# Patient Record
Sex: Male | Born: 1945
Health system: Southern US, Community
[De-identification: ages and names within clinical notes are randomized; demographics above are authoritative.]

## PROBLEM LIST (undated history)

## (undated) DIAGNOSIS — E278 Other specified disorders of adrenal gland: Secondary | ICD-10-CM

## (undated) DIAGNOSIS — I255 Ischemic cardiomyopathy: Secondary | ICD-10-CM

## (undated) DIAGNOSIS — M1990 Unspecified osteoarthritis, unspecified site: Secondary | ICD-10-CM

## (undated) DIAGNOSIS — R0600 Dyspnea, unspecified: Secondary | ICD-10-CM

## (undated) DIAGNOSIS — I472 Ventricular tachycardia, unspecified: Secondary | ICD-10-CM

## (undated) DIAGNOSIS — J111 Influenza due to unidentified influenza virus with other respiratory manifestations: Secondary | ICD-10-CM

## (undated) DIAGNOSIS — J449 Chronic obstructive pulmonary disease, unspecified: Secondary | ICD-10-CM

## (undated) DIAGNOSIS — E785 Hyperlipidemia, unspecified: Secondary | ICD-10-CM

## (undated) DIAGNOSIS — K5792 Diverticulitis of intestine, part unspecified, without perforation or abscess without bleeding: Secondary | ICD-10-CM

## (undated) DIAGNOSIS — L4050 Arthropathic psoriasis, unspecified: Secondary | ICD-10-CM

## (undated) DIAGNOSIS — F419 Anxiety disorder, unspecified: Secondary | ICD-10-CM

## (undated) DIAGNOSIS — C801 Malignant (primary) neoplasm, unspecified: Secondary | ICD-10-CM

## (undated) DIAGNOSIS — I714 Abdominal aortic aneurysm, without rupture, unspecified: Secondary | ICD-10-CM

## (undated) DIAGNOSIS — I10 Essential (primary) hypertension: Secondary | ICD-10-CM

## (undated) DIAGNOSIS — I2510 Atherosclerotic heart disease of native coronary artery without angina pectoris: Secondary | ICD-10-CM

## (undated) DIAGNOSIS — Z95810 Presence of automatic (implantable) cardiac defibrillator: Secondary | ICD-10-CM

## (undated) DIAGNOSIS — I739 Peripheral vascular disease, unspecified: Secondary | ICD-10-CM

## (undated) DIAGNOSIS — I5022 Chronic systolic (congestive) heart failure: Secondary | ICD-10-CM

## (undated) DIAGNOSIS — I509 Heart failure, unspecified: Secondary | ICD-10-CM

## (undated) DIAGNOSIS — I480 Paroxysmal atrial fibrillation: Secondary | ICD-10-CM

## (undated) HISTORY — DX: Atherosclerotic heart disease of native coronary artery without angina pectoris: I25.10

## (undated) HISTORY — DX: Anxiety disorder, unspecified: F41.9

## (undated) HISTORY — PX: REPAIR KNEE LIGAMENT: SUR1188

## (undated) HISTORY — DX: Peripheral vascular disease, unspecified: I73.9

## (undated) HISTORY — DX: Other specified disorders of adrenal gland: E27.8

## (undated) HISTORY — PX: OTHER SURGICAL HISTORY: SHX169

## (undated) HISTORY — DX: Arthropathic psoriasis, unspecified: L40.50

## (undated) HISTORY — DX: Hyperlipidemia, unspecified: E78.5

## (undated) HISTORY — DX: Ischemic cardiomyopathy: I25.5

## (undated) HISTORY — DX: Heart failure, unspecified: I50.9

## (undated) HISTORY — DX: Essential (primary) hypertension: I10

## (undated) HISTORY — DX: Chronic obstructive pulmonary disease, unspecified: J44.9

## (undated) HISTORY — DX: Paroxysmal atrial fibrillation: I48.0

---

## 1997-11-01 ENCOUNTER — Inpatient Hospital Stay (HOSPITAL_COMMUNITY): Admission: EM | Admit: 1997-11-01 | Discharge: 1997-11-04 | Payer: Self-pay | Admitting: Emergency Medicine

## 1997-11-01 ENCOUNTER — Encounter: Payer: Self-pay | Admitting: Emergency Medicine

## 1999-02-05 HISTORY — PX: CORONARY ANGIOPLASTY WITH STENT PLACEMENT: SHX49

## 1999-08-01 ENCOUNTER — Inpatient Hospital Stay (HOSPITAL_COMMUNITY): Admission: EM | Admit: 1999-08-01 | Discharge: 1999-08-03 | Payer: Self-pay | Admitting: Emergency Medicine

## 1999-11-02 ENCOUNTER — Ambulatory Visit (HOSPITAL_COMMUNITY): Admission: RE | Admit: 1999-11-02 | Discharge: 1999-11-02 | Payer: Self-pay | Admitting: Cardiology

## 2003-10-14 ENCOUNTER — Encounter: Admission: RE | Admit: 2003-10-14 | Discharge: 2003-10-14 | Payer: Self-pay | Admitting: Endocrinology

## 2004-10-29 ENCOUNTER — Ambulatory Visit: Payer: Self-pay | Admitting: Endocrinology

## 2004-11-05 ENCOUNTER — Ambulatory Visit: Payer: Self-pay | Admitting: Endocrinology

## 2004-12-13 ENCOUNTER — Ambulatory Visit: Payer: Self-pay | Admitting: Endocrinology

## 2005-02-06 ENCOUNTER — Ambulatory Visit: Payer: Self-pay | Admitting: Endocrinology

## 2005-02-08 ENCOUNTER — Ambulatory Visit: Payer: Self-pay | Admitting: Endocrinology

## 2005-06-05 ENCOUNTER — Encounter: Payer: Self-pay | Admitting: Emergency Medicine

## 2005-12-31 ENCOUNTER — Ambulatory Visit: Payer: Self-pay | Admitting: Endocrinology

## 2006-10-01 ENCOUNTER — Encounter: Payer: Self-pay | Admitting: Endocrinology

## 2006-10-01 DIAGNOSIS — I10 Essential (primary) hypertension: Secondary | ICD-10-CM | POA: Insufficient documentation

## 2006-10-01 DIAGNOSIS — J449 Chronic obstructive pulmonary disease, unspecified: Secondary | ICD-10-CM | POA: Insufficient documentation

## 2006-10-01 DIAGNOSIS — E279 Disorder of adrenal gland, unspecified: Secondary | ICD-10-CM

## 2006-10-01 DIAGNOSIS — I255 Ischemic cardiomyopathy: Secondary | ICD-10-CM

## 2006-10-01 DIAGNOSIS — Z87448 Personal history of other diseases of urinary system: Secondary | ICD-10-CM

## 2006-10-01 DIAGNOSIS — F411 Generalized anxiety disorder: Secondary | ICD-10-CM

## 2006-10-01 DIAGNOSIS — R7309 Other abnormal glucose: Secondary | ICD-10-CM

## 2006-10-01 DIAGNOSIS — I2510 Atherosclerotic heart disease of native coronary artery without angina pectoris: Secondary | ICD-10-CM | POA: Insufficient documentation

## 2007-01-22 ENCOUNTER — Encounter: Payer: Self-pay | Admitting: Endocrinology

## 2007-03-20 ENCOUNTER — Telehealth: Payer: Self-pay | Admitting: Endocrinology

## 2007-03-24 ENCOUNTER — Ambulatory Visit: Payer: Self-pay | Admitting: Endocrinology

## 2007-03-24 DIAGNOSIS — R7989 Other specified abnormal findings of blood chemistry: Secondary | ICD-10-CM | POA: Insufficient documentation

## 2007-03-25 LAB — CONVERTED CEMR LAB
Albumin: 3.7 g/dL (ref 3.5–5.2)
Bacteria, UA: NEGATIVE
Basophils Absolute: 0 10*3/uL (ref 0.0–0.1)
Bilirubin Urine: NEGATIVE
CO2: 30 meq/L (ref 19–32)
Cholesterol: 198 mg/dL (ref 0–200)
Creatinine, Ser: 1 mg/dL (ref 0.4–1.5)
GFR calc Af Amer: 97 mL/min
HCT: 46.2 % (ref 39.0–52.0)
HDL: 34.2 mg/dL — ABNORMAL LOW (ref >39.0)
Hemoglobin: 15.4 g/dL (ref 13.0–17.0)
Hgb A1c MFr Bld: 5.9 % (ref 4.6–6.0)
Leukocytes, UA: NEGATIVE
Lymphocytes Relative: 18.9 % (ref 12.0–46.0)
MCHC: 33.3 g/dL (ref 30.0–36.0)
MCV: 91.3 fL (ref 78.0–100.0)
Monocytes Absolute: 0.9 10*3/uL — ABNORMAL HIGH (ref 0.2–0.7)
Monocytes Relative: 9.2 % (ref 3.0–11.0)
Neutro Abs: 6.9 10*3/uL (ref 1.4–7.7)
Neutrophils Relative %: 68.6 % (ref 43.0–77.0)
PSA: 4.38 ng/mL — ABNORMAL HIGH (ref 0.10–4.00)
Potassium: 3.8 meq/L (ref 3.5–5.1)
RDW: 13.9 % (ref 11.5–14.6)
Sodium: 140 meq/L (ref 135–145)
TSH: 1.2 microintl units/mL (ref 0.35–5.50)
Total Bilirubin: 0.8 mg/dL (ref 0.3–1.2)
Total Protein: 6.7 g/dL (ref 6.0–8.3)
Triglycerides: 137 mg/dL (ref 0–149)
Urobilinogen, UA: 0.2 (ref 0.0–1.0)
VLDL: 27 mg/dL (ref 0–40)
WBC, UA: NONE SEEN cells/hpf

## 2007-04-08 ENCOUNTER — Ambulatory Visit: Payer: Self-pay | Admitting: Endocrinology

## 2007-04-08 DIAGNOSIS — R05 Cough: Secondary | ICD-10-CM

## 2007-04-08 DIAGNOSIS — R972 Elevated prostate specific antigen [PSA]: Secondary | ICD-10-CM

## 2007-04-09 ENCOUNTER — Encounter (INDEPENDENT_AMBULATORY_CARE_PROVIDER_SITE_OTHER): Payer: Self-pay | Admitting: *Deleted

## 2007-04-29 ENCOUNTER — Ambulatory Visit: Payer: Self-pay | Admitting: Cardiology

## 2007-05-01 ENCOUNTER — Emergency Department (HOSPITAL_COMMUNITY): Admission: EM | Admit: 2007-05-01 | Discharge: 2007-05-01 | Payer: Self-pay | Admitting: Emergency Medicine

## 2007-05-04 ENCOUNTER — Ambulatory Visit: Payer: Self-pay | Admitting: Cardiology

## 2007-05-04 ENCOUNTER — Ambulatory Visit: Payer: Self-pay | Admitting: Gastroenterology

## 2007-05-04 LAB — CONVERTED CEMR LAB
BUN: 9 mg/dL (ref 6–23)
Creatinine, Ser: 1 mg/dL (ref 0.4–1.5)
GFR calc Af Amer: 97 mL/min
GFR calc non Af Amer: 80 mL/min

## 2007-05-11 ENCOUNTER — Ambulatory Visit: Payer: Self-pay | Admitting: Cardiology

## 2007-05-11 LAB — CONVERTED CEMR LAB
Calcium: 9.2 mg/dL (ref 8.4–10.5)
Calcium: 9.2 mg/dL (ref 8.4–10.5)
Creatinine, Ser: 1 mg/dL (ref 0.4–1.5)
GFR calc Af Amer: 97 mL/min
GFR calc Af Amer: 110 mL/min
GFR calc non Af Amer: 80 mL/min
GFR calc non Af Amer: 91 mL/min
Sodium: 135 meq/L (ref 135–145)

## 2007-05-14 ENCOUNTER — Encounter: Payer: Self-pay | Admitting: Gastroenterology

## 2007-05-14 ENCOUNTER — Encounter: Payer: Self-pay | Admitting: Internal Medicine

## 2007-05-14 ENCOUNTER — Ambulatory Visit: Payer: Self-pay | Admitting: Gastroenterology

## 2007-06-01 ENCOUNTER — Telehealth (INDEPENDENT_AMBULATORY_CARE_PROVIDER_SITE_OTHER): Payer: Self-pay | Admitting: *Deleted

## 2007-06-01 ENCOUNTER — Ambulatory Visit: Payer: Self-pay | Admitting: Cardiology

## 2007-06-03 ENCOUNTER — Ambulatory Visit: Payer: Self-pay | Admitting: Cardiology

## 2007-06-03 ENCOUNTER — Inpatient Hospital Stay (HOSPITAL_COMMUNITY): Admission: EM | Admit: 2007-06-03 | Discharge: 2007-06-06 | Payer: Self-pay | Admitting: Emergency Medicine

## 2007-06-04 ENCOUNTER — Encounter: Payer: Self-pay | Admitting: Cardiology

## 2007-06-19 ENCOUNTER — Ambulatory Visit: Payer: Self-pay

## 2007-08-04 ENCOUNTER — Ambulatory Visit: Payer: Self-pay

## 2007-08-04 LAB — CONVERTED CEMR LAB
ALT: 15 units/L (ref 0–53)
AST: 19 units/L (ref 0–37)
BUN: 13 mg/dL (ref 6–23)
CO2: 27 meq/L (ref 19–32)
Chloride: 109 meq/L (ref 96–112)
Cholesterol: 121 mg/dL (ref 0–200)
Creatinine, Ser: 0.9 mg/dL (ref 0.4–1.5)
Total Bilirubin: 0.8 mg/dL (ref 0.3–1.2)
Total CHOL/HDL Ratio: 4.1
Triglycerides: 119 mg/dL (ref 0–149)

## 2008-01-26 ENCOUNTER — Emergency Department (HOSPITAL_COMMUNITY): Admission: EM | Admit: 2008-01-26 | Discharge: 2008-01-26 | Payer: Self-pay | Admitting: Emergency Medicine

## 2008-01-26 ENCOUNTER — Emergency Department (HOSPITAL_COMMUNITY): Admission: EM | Admit: 2008-01-26 | Discharge: 2008-01-26 | Payer: Self-pay | Admitting: *Deleted

## 2008-07-11 ENCOUNTER — Telehealth: Payer: Self-pay | Admitting: Cardiology

## 2008-09-05 DIAGNOSIS — E785 Hyperlipidemia, unspecified: Secondary | ICD-10-CM | POA: Insufficient documentation

## 2008-09-06 ENCOUNTER — Ambulatory Visit: Payer: Self-pay | Admitting: Cardiology

## 2008-09-06 ENCOUNTER — Encounter (INDEPENDENT_AMBULATORY_CARE_PROVIDER_SITE_OTHER): Payer: Self-pay | Admitting: *Deleted

## 2008-09-06 DIAGNOSIS — I714 Abdominal aortic aneurysm, without rupture, unspecified: Secondary | ICD-10-CM | POA: Insufficient documentation

## 2008-09-06 DIAGNOSIS — F17200 Nicotine dependence, unspecified, uncomplicated: Secondary | ICD-10-CM

## 2008-10-28 ENCOUNTER — Ambulatory Visit: Payer: Self-pay

## 2008-10-28 ENCOUNTER — Encounter (INDEPENDENT_AMBULATORY_CARE_PROVIDER_SITE_OTHER): Payer: Self-pay | Admitting: *Deleted

## 2008-10-28 ENCOUNTER — Encounter: Payer: Self-pay | Admitting: Cardiology

## 2008-10-28 ENCOUNTER — Ambulatory Visit: Payer: Self-pay | Admitting: Cardiology

## 2008-10-28 LAB — CONVERTED CEMR LAB
ALT: 15 units/L (ref 0–53)
BUN: 12 mg/dL (ref 6–23)
CO2: 27 meq/L (ref 19–32)
Chloride: 106 meq/L (ref 96–112)
Cholesterol: 117 mg/dL (ref 0–200)
Creatinine, Ser: 0.8 mg/dL (ref 0.4–1.5)
Glucose, Bld: 87 mg/dL (ref 70–99)
LDL Cholesterol: 71 mg/dL (ref 0–99)
Total Bilirubin: 0.9 mg/dL (ref 0.3–1.2)
Total Protein: 7.4 g/dL (ref 6.0–8.3)
Triglycerides: 49 mg/dL (ref 0.0–149.0)

## 2008-11-01 ENCOUNTER — Ambulatory Visit: Payer: Self-pay | Admitting: Endocrinology

## 2008-11-01 DIAGNOSIS — R634 Abnormal weight loss: Secondary | ICD-10-CM | POA: Insufficient documentation

## 2008-11-03 LAB — CONVERTED CEMR LAB
Basophils Absolute: 0.1 10*3/uL (ref 0.0–0.1)
HCT: 44 % (ref 39.0–52.0)
Hgb A1c MFr Bld: 6 % (ref 4.6–6.5)
Lymphs Abs: 2.4 10*3/uL (ref 0.7–4.0)
MCHC: 33.9 g/dL (ref 30.0–36.0)
MCV: 94.2 fL (ref 78.0–100.0)
Monocytes Absolute: 0.8 10*3/uL (ref 0.1–1.0)
Platelets: 180 10*3/uL (ref 150.0–400.0)
RDW: 14.5 % (ref 11.5–14.6)
Urine Glucose: NEGATIVE mg/dL
Urobilinogen, UA: 1 (ref 0.0–1.0)

## 2008-12-14 ENCOUNTER — Encounter (INDEPENDENT_AMBULATORY_CARE_PROVIDER_SITE_OTHER): Payer: Self-pay | Admitting: *Deleted

## 2008-12-26 ENCOUNTER — Encounter (INDEPENDENT_AMBULATORY_CARE_PROVIDER_SITE_OTHER): Payer: Self-pay | Admitting: *Deleted

## 2009-01-04 ENCOUNTER — Encounter (INDEPENDENT_AMBULATORY_CARE_PROVIDER_SITE_OTHER): Payer: Self-pay | Admitting: *Deleted

## 2009-11-10 ENCOUNTER — Ambulatory Visit: Payer: Self-pay | Admitting: Cardiology

## 2009-11-22 IMAGING — CR DG FINGER MIDDLE 2+V*L*
3 series · 3 of 3 positions shown · non-contrast
Comparison: An

CLINICAL DATA: Laceration

LEFT MIDDLE FINGER 2+V

[x finger pa left]
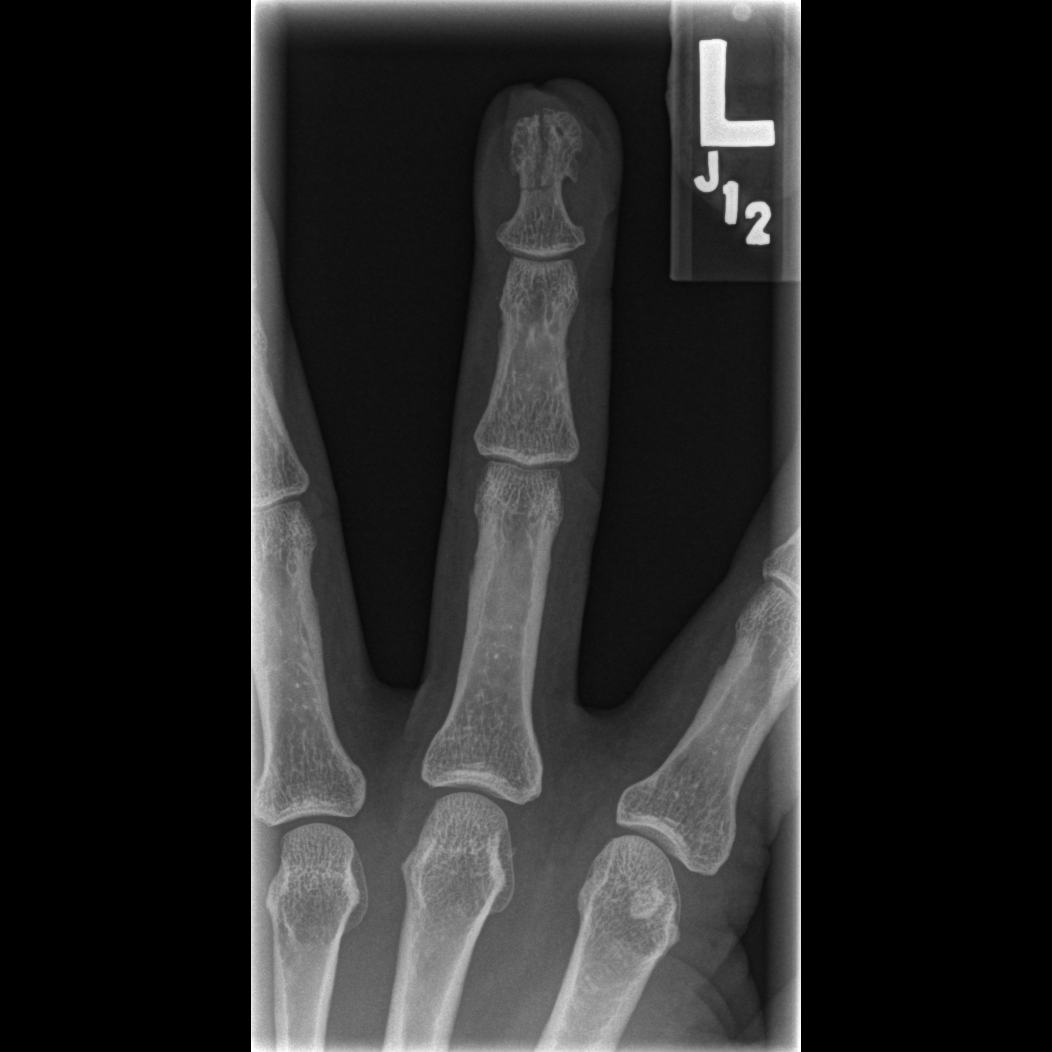

[x finger obl. left]
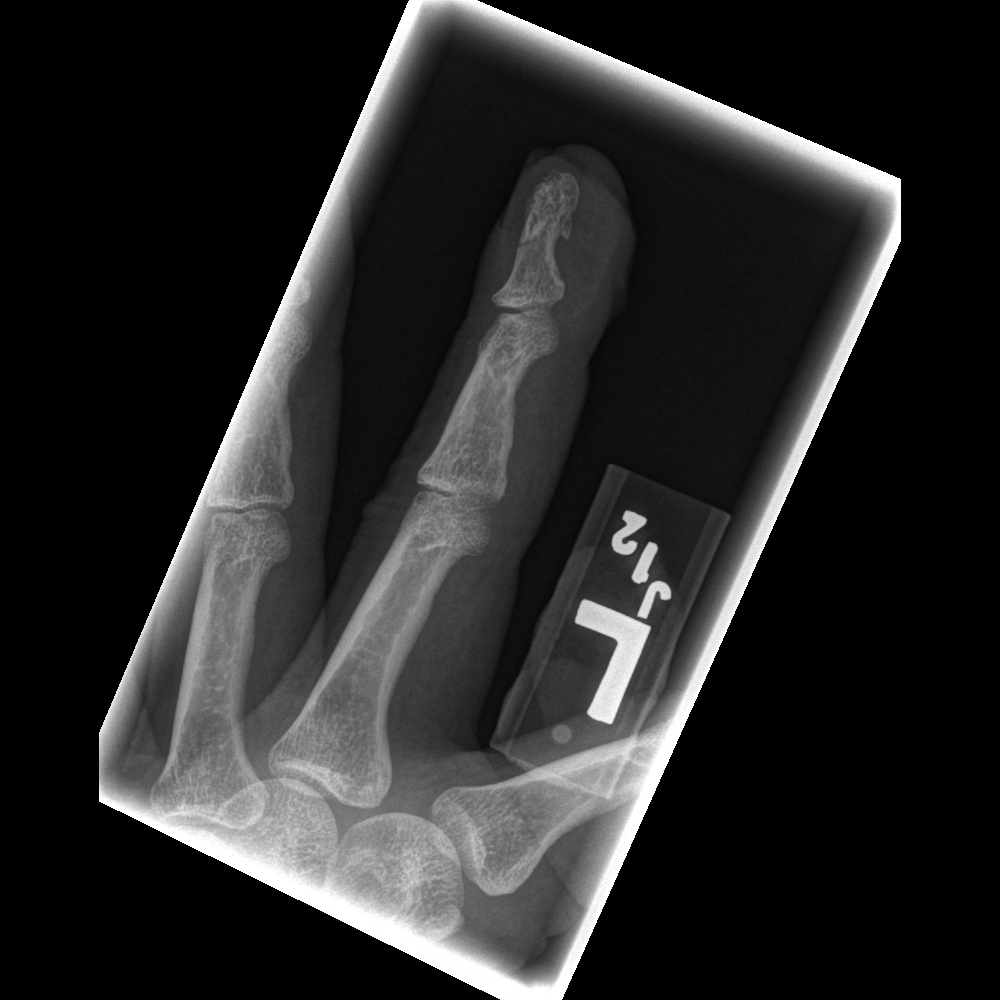

[x finger lateral left]
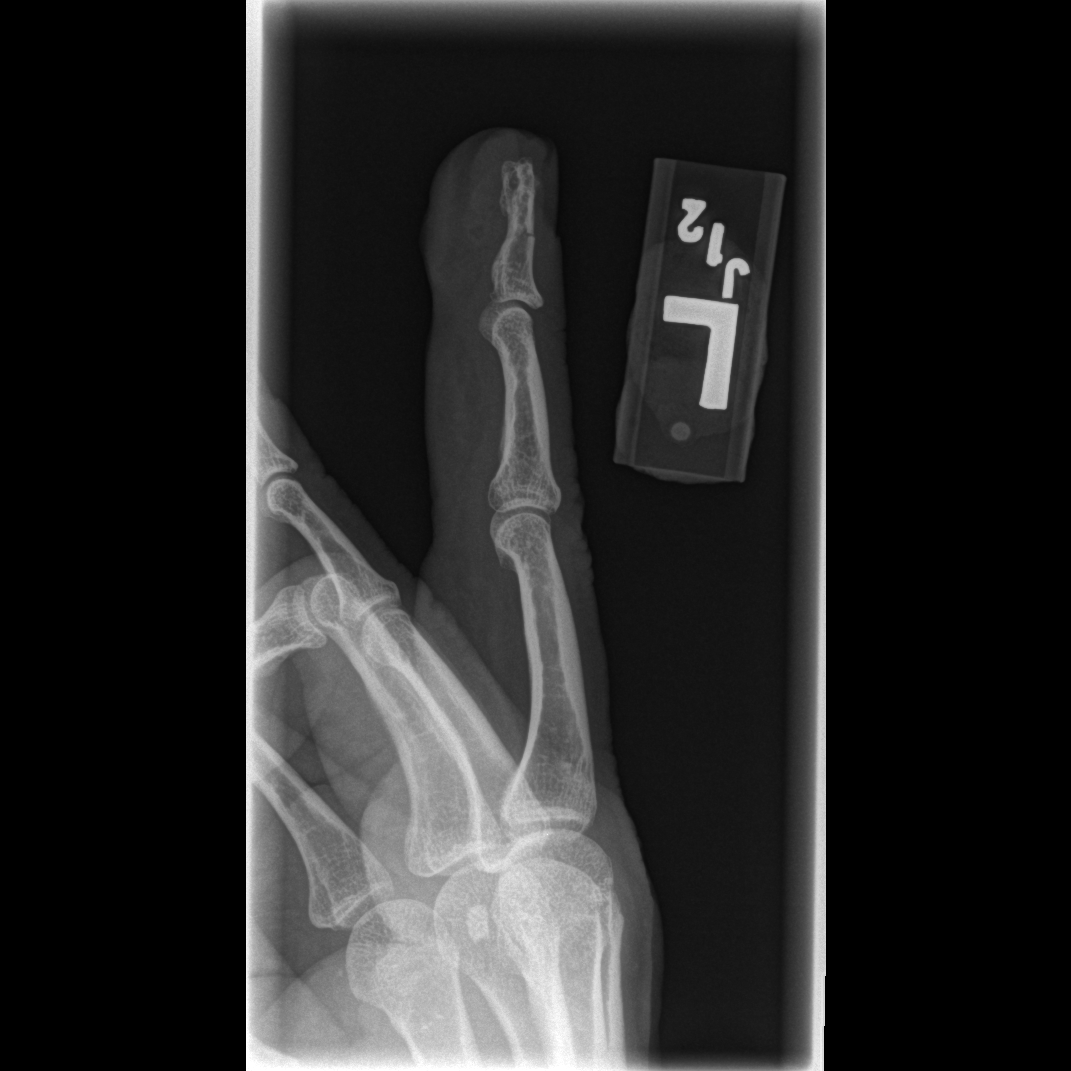

[3 of 3 positions shown; findings below may reference images not displayed]

FINDINGS: Three views of the left middle finger.  There is a
fracture involving the left middle finger  distal phalanx.
Fracture extends to the distal aspect of the tuft and there is a
horizontal and vertical component to the fracture.  Soft tissue
swelling associated with the distal finger.
IMPRESSION: T-shaped fracture involving the distal phalanx of the left middle
finger.

## 2009-11-27 ENCOUNTER — Encounter: Payer: Self-pay | Admitting: Cardiology

## 2009-11-27 ENCOUNTER — Ambulatory Visit: Payer: Self-pay | Admitting: Internal Medicine

## 2009-11-27 ENCOUNTER — Ambulatory Visit: Payer: Self-pay

## 2009-11-28 ENCOUNTER — Ambulatory Visit: Payer: Self-pay | Admitting: Cardiology

## 2009-12-05 ENCOUNTER — Telehealth (INDEPENDENT_AMBULATORY_CARE_PROVIDER_SITE_OTHER): Payer: Self-pay | Admitting: *Deleted

## 2009-12-08 ENCOUNTER — Encounter (INDEPENDENT_AMBULATORY_CARE_PROVIDER_SITE_OTHER): Payer: Self-pay | Admitting: *Deleted

## 2010-03-06 NOTE — Assessment & Plan Note (Signed)
Summary: follow up/mt/ appt is 4:15/ gd  Medications Added CARVEDILOL 12.5 MG TABS (CARVEDILOL) Take one and 1/2 tablet by mouth twice a day        History of Present Illness: Pleasant gentleman  with a past medical history of coronary disease, ischemic cardio myopathy, hypertension and hyperlipidemia. Cardiac history dates back to June of 2001. At that time the patient was found to have an abnormal electrocardiogram. Catheterization revealed a 95% mid LAD and a 70% diffuse right coronary artery. His ejection fraction was 25-30%. He had PCI of his LAD at that time. Last cardiac catheterization was performed in April of 2009. At that time he was found to have nonobstructive disease with the most significant being a 60-70% mid right coronary artery. He also had an ejection fraction of 15% with moderate mitral regurgitation. Last echocardiogram was performed in Sept 2010 and showed an ejection fraction of 30%. I arranged for him to see EP for consideration of ICD but he did not come to that appointment. Abdominal ultrasound in September 2010 showed a 3.5 x 3.5 cm aneurysm and dilated iliacs. Followup recommended in one year. I last saw him in August of 2010. Since then, the patient has dyspnea with more extreme activities but not with routine activities. It is relieved with rest. It is not associated with chest pain. There is no orthopnea, PND or pedal edema. There is no syncope or palpitations. There is no exertional chest pain.   Current Medications (verified): 1)  Aspirin Ec 325 Mg Tbec (Aspirin) .... Take One Tablet By Mouth Daily 2)  Multivitamins   Tabs (Multiple Vitamin) .Marland Kitchen.. 1 Tab By Mouth Once Daily 3)  Nitroglycerin 0.4 Mg Subl (Nitroglycerin) .... One Tablet Under Tongue Every 5 Minutes As Needed For Chest Pain---May Repeat Times Three 4)  Lipitor 80 Mg Tabs (Atorvastatin Calcium) .... Take One Tablet By Mouth Daily. 5)  Furosemide 40 Mg Tabs (Furosemide) .... Take One Tablet By Mouth  Daily. 6)  Lisinopril 20 Mg Tabs (Lisinopril) .... Take One Tablet By Mouth Daily 7)  Potassium Chloride Crys Cr 20 Meq Cr-Tabs (Potassium Chloride Crys Cr) .Marland Kitchen.. 1 Tab By Mouth Once Daily 8)  Carvedilol 12.5 Mg Tabs (Carvedilol) .... Take One Tablet By Mouth Twice A Day  Allergies: No Known Drug Allergies  Past History:  Past Medical History: CORONARY ARTERY DISEASE (ICD-414.00) CARDIOMYOPATHY, ISCHEMIC (ICD-414.8) HYPERTENSION (ICD-401.9) HYPERLIPIDEMIA (ICD-272.4) COPD (ICD-496) PSA, INCREASED (ICD-790.93) HEMATURIA, HX OF (ICD-V13.09) ADRENAL MASS (ICD-255.8) ANXIETY (ICD-300.00)    Social History: Reviewed history from 09/06/2008 and no changes required. married works Therapist, music care Tobacco Use - Yes.   Review of Systems       Complains of weight loss but no fevers or chills, productive cough, hemoptysis, dysphasia, odynophagia, melena, hematochezia, dysuria, hematuria, rash, seizure activity, orthopnea, PND, pedal edema, claudication. Remaining systems are negative.   Vital Signs:  Patient profile:   65 year old male Height:      71 inches Weight:      145 pounds BMI:     20.30 Pulse rate:   75 / minute Resp:     12 per minute BP sitting:   144 / 82  (left arm)  Vitals Entered By: Kem Parkinson (November 10, 2009 4:14 PM)  Physical Exam  General:  Well-developed well-nourished in no acute distress.  Skin is warm and dry.  HEENT is normal.  Neck is supple. No thyromegaly.  Chest is clear to auscultation with normal expansion.  Cardiovascular exam  is regular rate and rhythm.  Abdominal exam nontender or distended. No masses palpated. Extremities show no edema. neuro grossly intact    EKG  Procedure date:  11/10/2009  Findings:      Sinus rhythm at a rate of 75. Prior anterior and inferior infarct. Inferolateral T-wave inversion.  Impression & Recommendations:  Problem # 1:  WEIGHT LOSS (ICD-783.21) Followup primary care. Note I scheduled him to see  pulmonary in the past for COPD and hilar adenopathy. However he did not come to that appointment and is not interested in pursuing this.  Problem # 2:  TOBACCO ABUSE (ICD-305.1) Patient counseled on discontinuing.  Problem # 3:  AAA (ICD-441.4) Scheduled followup abdominal ultrasound.  Problem # 4:  CORONARY ARTERY DISEASE (ICD-414.00) Continue aspirin, ACE inhibitor, beta blocker and statin. His updated medication list for this problem includes:    Aspirin Ec 325 Mg Tbec (Aspirin) .Marland Kitchen... Take one tablet by mouth daily    Nitroglycerin 0.4 Mg Subl (Nitroglycerin) ..... One tablet under tongue every 5 minutes as needed for chest pain---may repeat times three    Lisinopril 20 Mg Tabs (Lisinopril) .Marland Kitchen... Take one tablet by mouth daily    Carvedilol 12.5 Mg Tabs (Carvedilol) .Marland Kitchen... Take one and 1/2 tablet by mouth twice a day  Problem # 5:  CARDIOMYOPATHY, ISCHEMIC (ICD-414.8) Continue present medications. EP evaluation for consideration of ICD. His updated medication list for this problem includes:    Aspirin Ec 325 Mg Tbec (Aspirin) .Marland Kitchen... Take one tablet by mouth daily    Nitroglycerin 0.4 Mg Subl (Nitroglycerin) ..... One tablet under tongue every 5 minutes as needed for chest pain---may repeat times three    Furosemide 40 Mg Tabs (Furosemide) .Marland Kitchen... Take one tablet by mouth daily.    Lisinopril 20 Mg Tabs (Lisinopril) .Marland Kitchen... Take one tablet by mouth daily    Carvedilol 12.5 Mg Tabs (Carvedilol) .Marland Kitchen... Take one and 1/2 tablet by mouth twice a day  Problem # 6:  HYPERTENSION (ICD-401.9) Blood pressure mildly elevated. Increase Coreg to 18.75 mg p.o. b.i.d. Check potassium and renal function. His updated medication list for this problem includes:    Aspirin Ec 325 Mg Tbec (Aspirin) .Marland Kitchen... Take one tablet by mouth daily    Furosemide 40 Mg Tabs (Furosemide) .Marland Kitchen... Take one tablet by mouth daily.    Lisinopril 20 Mg Tabs (Lisinopril) .Marland Kitchen... Take one tablet by mouth daily    Carvedilol 12.5 Mg Tabs  (Carvedilol) .Marland Kitchen... Take one and 1/2 tablet by mouth twice a day  Problem # 7:  HYPERLIPIDEMIA (ICD-272.4) Continue statin. Check lipids and liver. His updated medication list for this problem includes:    Lipitor 80 Mg Tabs (Atorvastatin calcium) .Marland Kitchen... Take one tablet by mouth daily.  Problem # 8:  COPD (ICD-496)  Other Orders: Abdominal Aorta Duplex (Abd Aorta Duplex) EP Referral (Cardiology EP Ref )  Patient Instructions: 1)  Your physician recommends that you schedule a follow-up appointment in: 6 MONTHS 2)  Your physician recommends that you return for lab work [id number] ULTASOUND-LIPID/LIVER/BMP-272.0/V58.69/401.1 3)  Your physician has recommended you make the following change in your medication: INCREASE CARVEDALOL 12.5MG  TAKE ONE AND ONE HALF TABLET TWICE DAILY 4)  You have been referred to EP TO DISCUSS ICD 5)  Your physician has requested that you have an abdominal aorta duplex. During this test, an ultrasound is used to evaluate the aorta. Allow 30 minutes for this exam. Do not eat after midnight the day before and avoid carbonated beverages. There are  no restrictions or special instructions. Prescriptions: CARVEDILOL 12.5 MG TABS (CARVEDILOL) Take one and 1/2 tablet by mouth twice a day  #270 x 4   Entered by:   Deliah Goody, RN   Authorized by:   Ferman Hamming, MD, [facility]   Signed by:   Deliah Goody, RN on 11/10/2009   Method used:   Faxed to ...       [facility] Fax) (mail-order)             ,          Ph: [phone]       Fax: [phone]   RxID:   [id number]

## 2010-03-06 NOTE — Assessment & Plan Note (Signed)
Summary: eval for icd/sl      Allergies Added: NKDA  Visit Type:  Initial Consult Referring Provider:  Dr [name] Primary Provider:  Minus Breeding MD   History of Present Illness: Benjamin Cook is a pleasant 65 yo AAM with a h/o CAD, ischemic CM, and NYHA Class II/III CHF who presents today for EP consultation regarding risk stratefication for sudden death.   His cardiac history dates back to June of 2001. At that time the patient was found to have an abnormal electrocardiogram. Catheterization revealed a 95% mid LAD and a 70% diffuse right coronary artery. His ejection fraction was 25-30%. He had PCI of his LAD at that time. Last cardiac catheterization was performed in April of 2009. At that time he was found to have nonobstructive disease with the most significant being a 60-70% mid right coronary artery. He also had an ejection fraction of 15% with moderate mitral regurgitation. Last echocardiogram was performed in Sept 2010 and showed an ejection fraction of 30%.  He was referred to EP for consideration of ICD but he did not come to that appointment. Abdominal ultrasound in September 2010 showed a 3.5 x 3.5 cm aneurysm and dilated iliacs.  Presently, he reports doing well.  He denies exertion chest pain and remains active.  He finds that he is He denies symptoms of palpitations, orthopnea, PND, lower extremity edema, dizziness, presyncope, syncope, or neurologic sequela. The patient is tolerating medications without difficulties and is otherwise without complaint today.   Current Medications (verified): 1)  Aspirin Ec 325 Mg Tbec (Aspirin) .... Take One Tablet By Mouth Daily 2)  Multivitamins   Tabs (Multiple Vitamin) .Marland Kitchen.. 1 Tab By Mouth Once Daily 3)  Nitroglycerin 0.4 Mg Subl (Nitroglycerin) .... One Tablet Under Tongue Every 5 Minutes As Needed For Chest Pain---May Repeat Times Three 4)  Lipitor 80 Mg Tabs (Atorvastatin Calcium) .... Take One Tablet By Mouth Daily. 5)  Furosemide 40 Mg  Tabs (Furosemide) .... Take One Tablet By Mouth Daily. 6)  Lisinopril 20 Mg Tabs (Lisinopril) .... Take One Tablet By Mouth Daily 7)  Potassium Chloride Crys Cr 20 Meq Cr-Tabs (Potassium Chloride Crys Cr) .Marland Kitchen.. 1 Tab By Mouth Once Daily 8)  Carvedilol 12.5 Mg Tabs (Carvedilol) .... Take One and 1/2 Tablet By Mouth Twice A Day  Allergies (verified): No Known Drug Allergies  Past History:  Past Medical History: CORONARY ARTERY DISEASE (ICD-414.00) CARDIOMYOPATHY, ISCHEMIC (EF 30%) NYHA Class II/III CHF HYPERTENSION (ICD-401.9) HYPERLIPIDEMIA (ICD-272.4) COPD (ICD-496), smokes cigars but has quit cigareetes PSA, INCREASED (ICD-790.93) HEMATURIA, HX OF (ICD-V13.09) ADRENAL MASS- per pt this is remote (10 years) and benign by biopsy ANXIETY (ICD-300.0 PVD (3.5x3.5cm aortic aneurysm)    Past Surgical History: Stent placement cataract surgery  Family History: no cancer father is alive and healthy at age 55 mother had cirrhosis due to ETOH  Social History: 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   Vital Signs:  Patient profile:   65 year old male Height:      71 inches Weight:      141 pounds BMI:     19.74 Pulse rate:   64 / minute BP sitting:   148 / 72  (left arm)  Vitals Entered By: Laurance Flatten CMA (  November 27, 2009 10:14 AM)  Physical Exam  General:  Well developed, well nourished, in no acute distress. Head:  normocephalic and atraumatic Eyes:  PERRLA/EOM intact; conjunctiva and lids normal. Mouth:  Teeth, gums and palate normal. Oral mucosa normal. Neck:  supple, JVP 8cm Lungs:  prolonged expiratory phase without wheezing or rales Heart:  RRR, no m/r/g Abdomen:  Bowel sounds positive; abdomen soft and non-tender without masses, organomegaly, or  hernias noted. No hepatosplenomegaly. Msk:  Back normal, normal gait. Muscle strength and tone normal. Pulses:  pulses normal in all 4 extremities Extremities:  No clubbing or cyanosis. Neurologic:  Alert and oriented x 3. Skin:  Intact without lesions or rashes. Psych:  Normal affect.   EKG  Procedure date:  11/27/2009  Findings:      sinus rhythm 75 bpm, PR 146, QRS , Qtc 462, inferior and anterior MI  Impression & Recommendations:  Problem # 1:  CORONARY ARTERY DISEASE (ICD-414.00) The patient has an ischemic CM (EF 30), NYHA Class II/III CHF, and CAD.  He meets MADIT II/ SCD-HeFT criteria for ICD implantation for primary prevention of sudden death.  Risks, benefits, alternatives to ICD implantation were discussed in detail with the patient today.  He understands that the risks include but are not limited to bleeding, infection, pneumothorax, perforation, tamponade, vascular damage, renal failure, MI, stroke, death, inappropriate shocks, and lead dislodgement.  He wishes to wait until after Christmas to have his ICD implanted.  We offered several dates, however, he wishes to go home and consult his calendar before committing.  He will contact our office to schedule ICD implantation.  He will continue medical therapy in the interim as directed by Dr [name].  Problem # 2:  CARDIOMYOPATHY, ISCHEMIC (ICD-414.8) as above no symptoms of ischemia stable CHF  Problem # 3:  TOBACCO ABUSE (ICD-305.1) cessation of cigars is advised  Problem # 4:  HYPERTENSION (ICD-401.9) stable  Problem # 5:  HYPERLIPIDEMIA (ICD-272.4) stable  Patient Instructions: 1)  Your physician has recommended that you have a defibrillator inserted.  An implantable cardioverter defibrillator (ICD) is a small device that is placed in your chest or, in rare cases, your abdomen. This device uses electrical pulses or shocks to help control life-threatening, irregular heartbeats that could lead the heart to  suddenly stop beating (sudden cardiac arrest). Leads are attached to the ICD that goes into your heart. This is done in the hospital and usually requires an overnight stay. Please see the instruction sheet given to you today for more information. 2)  Patient will call back when ready to schedule

## 2010-03-06 NOTE — Progress Notes (Signed)
   Phone Note Outgoing Call   Call placed by: Deliah Goody, RN,  December 05, 2009 1:56 PM Summary of Call: [facility] wanting to change pt from lipitor to crestor. per dr [name] pt to start crestor 40mg  daily. pt will need to have lipid and liver checked in 6 weeks. Left message to call back Deliah Goody, RN  December 05, 2009 1:58 PM   Follow-up for Phone Call        spoke with pt dtr, she is aware of med change and need for blood work in 6 weeks after changing Deliah Goody, RN  December 08, 2009 2:34 PM

## 2010-03-06 NOTE — Miscellaneous (Signed)
  Clinical Lists Changes  Medications: Changed medication from LIPITOR 80 MG TABS (ATORVASTATIN CALCIUM) Take one tablet by mouth daily. to CRESTOR 40 MG TABS (ROSUVASTATIN CALCIUM) Take one tablet by mouth daily. - Signed Rx of CRESTOR 40 MG TABS (ROSUVASTATIN CALCIUM) Take one tablet by mouth daily.;  #90 x 4;  Signed;  Entered by: Deliah Goody, RN;  Authorized by: Ferman Hamming, MD, [facility];  Method used: Faxed to [facility] Delivery Fax), , ,   , Ph: [phone], Fax: [phone]    Prescriptions: CRESTOR 40 MG TABS (ROSUVASTATIN CALCIUM) Take one tablet by mouth daily.  #90 x 4   Entered by:   Deliah Goody, RN   Authorized by:   Ferman Hamming, MD, [facility]   Signed by:   Deliah Goody, RN on 12/08/2009   Method used:   Faxed to ...       [facility] Fax) (mail-order)             ,          Ph: [phone]       Fax: [phone]   RxID:   [id number]

## 2010-04-09 ENCOUNTER — Telehealth: Payer: Self-pay | Admitting: Internal Medicine

## 2010-04-17 NOTE — Progress Notes (Signed)
Summary: ready to schedule surgery   Phone Note Call from Patient   Caller: Patient [phone] or [phone] Reason for Call: Talk to Nurse Summary of Call: pt ready to schedule defib placment Initial call taken by: Glynda Jaeger,  April 09, 2010 3:29 PM  Follow-up for Phone Call        pt called monday re scheduling surgery-hasn't heard anything and wants to schedule Glynda Jaeger  April 11, 2010 4:11 PM spoke with pt let him know I will discuss with Dr Johney Frame and be in touch with him tomorrow.  Do you need to see this pt back in the office agian prior to ICD impalnt  It has been 5 months since initial workup Dennis Bast, RN, BSN  April 11, 2010 5:16 PM Follow-up by: Hillis Range, MD,  April 11, 2010 10:04 PM  Additional Follow-up for Phone Call Additional follow up Details #1::        I should see him back in the office as it has been quite a while since we spoke about ICDs. He may also be a candidate for Analyze ST and we can discuss this option when he comes to the office.  Please arrange to see me at the next available time. Additional Follow-up by: Hillis Range, MD,  April 11, 2010 10:05 PM    Additional Follow-up for Phone Call Additional follow up Details #2::    will have Melissa set pt up for apt for ICD implant Dennis Bast, RN, BSN  April 12, 2010 5:47 PM

## 2010-04-18 ENCOUNTER — Encounter: Payer: Self-pay | Admitting: Internal Medicine

## 2010-04-18 ENCOUNTER — Ambulatory Visit (INDEPENDENT_AMBULATORY_CARE_PROVIDER_SITE_OTHER): Payer: BC Managed Care – PPO | Admitting: Internal Medicine

## 2010-04-18 DIAGNOSIS — I5022 Chronic systolic (congestive) heart failure: Secondary | ICD-10-CM

## 2010-04-18 DIAGNOSIS — I5020 Unspecified systolic (congestive) heart failure: Secondary | ICD-10-CM | POA: Insufficient documentation

## 2010-04-18 DIAGNOSIS — I10 Essential (primary) hypertension: Secondary | ICD-10-CM

## 2010-04-18 DIAGNOSIS — I2589 Other forms of chronic ischemic heart disease: Secondary | ICD-10-CM

## 2010-04-18 DIAGNOSIS — I5023 Acute on chronic systolic (congestive) heart failure: Secondary | ICD-10-CM | POA: Insufficient documentation

## 2010-04-24 ENCOUNTER — Encounter: Payer: Self-pay | Admitting: *Deleted

## 2010-04-24 NOTE — Assessment & Plan Note (Signed)
Summary: eval icd implant per kelly/mt      Allergies Added: NKDA  Visit Type:  Follow-up Referring Provider:  Dr [name] Primary Provider:  Minus Breeding MD   History of Present Illness: Benjamin Cook is a pleasant 65 yo AAM with a h/o CAD, ischemic CM, and NYHA Class II/III CHF who presents today for EP follow-up regarding risk stratefication for sudden death.   His cardiac history dates back to June of 2001. At that time the patient was found to have an abnormal electrocardiogram. Catheterization revealed a 95% mid LAD and a 70% diffuse right coronary artery. His ejection fraction was 25-30%. He had PCI of his LAD at that time. Last cardiac catheterization was performed in April of 2009. At that time he was found to have nonobstructive disease with the most significant being a 60-70% mid right coronary artery. He also had an ejection fraction of 15% with moderate mitral regurgitation. Last echocardiogram was performed in Sept 2010 and showed an ejection fraction of 30%.  He was referred to EP for consideration of ICD but he did not come to that appointment. Abdominal ultrasound in September 2010 showed a 3.5 x 3.5 cm aneurysm and dilated iliacs. He saw me back in October and wanted to defer ICD imlantation.   Presently, he reports doing well.  He denies exertion chest pain and remains active.  He finds that he is He denies symptoms of palpitations, orthopnea, PND, lower extremity edema, dizziness, presyncope, syncope, or neurologic sequela. The patient is tolerating medications without difficulties and is otherwise without complaint today.   Current Medications (verified): 1)  Aspirin Ec 325 Mg Tbec (Aspirin) .... Take One Tablet By Mouth Daily 2)  Multivitamins   Tabs (Multiple Vitamin) .Marland Kitchen.. 1 Tab By Mouth Once Daily 3)  Nitroglycerin 0.4 Mg Subl (Nitroglycerin) .... One Tablet Under Tongue Every 5 Minutes As Needed For Chest Pain---May Repeat Times Three 4)  Crestor 40 Mg Tabs  (Rosuvastatin Calcium) .... Take One Tablet By Mouth Daily. 5)  Furosemide 40 Mg Tabs (Furosemide) .... Take One Tablet By Mouth Daily. 6)  Lisinopril 20 Mg Tabs (Lisinopril) .... Take One Tablet By Mouth Daily 7)  Potassium Chloride Crys Cr 20 Meq Cr-Tabs (Potassium Chloride Crys Cr) .Marland Kitchen.. 1 Tab By Mouth Once Daily 8)  Carvedilol 12.5 Mg Tabs (Carvedilol) .... Take One and 1/2 Tablet By Mouth Twice A Day  Allergies (verified): No Known Drug Allergies  Past History:  Past Medical History: Reviewed history from 11/27/2009 and no changes required. CORONARY ARTERY DISEASE (ICD-414.00) CARDIOMYOPATHY, ISCHEMIC (EF 30%) NYHA Class II/III CHF HYPERTENSION (ICD-401.9) HYPERLIPIDEMIA (ICD-272.4) COPD (ICD-496), smokes cigars but has quit cigareetes PSA, INCREASED (ICD-790.93) HEMATURIA, HX OF (ICD-V13.09) ADRENAL MASS- per pt this is remote (10 years) and benign by biopsy ANXIETY (ICD-300.0 PVD (3.5x3.5cm aortic aneurysm)    Past Surgical History: Reviewed history from 11/27/2009 and no changes required. Stent placement cataract surgery  Family History: Reviewed history from 11/27/2009 and no changes required. no cancer father is alive and healthy at age 29 mother had cirrhosis due to ETOH  Social History: Reviewed history from 11/27/2009 and no changes required. 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   Vital Signs:  Patient profile:   65 year old male  Height:      71 inches Weight:      140 pounds BMI:     19.60 Pulse rate:   56 / minute BP sitting:   140 / 76  Vitals Entered By: Laurance Flatten CMA (April 18, 2010 10:14 AM)  Physical Exam  General:  Well developed, well nourished, in no acute distress. Head:  normocephalic and atraumatic Eyes:  PERRLA/EOM  intact; conjunctiva and lids normal. Mouth:  Teeth, gums and palate normal. Oral mucosa normal. Neck:  supple, JVP 8cm Lungs:  prolonged expiratory phase without wheezing or rales Heart:  RRR, no m/r/g Abdomen:  Bowel sounds positive; abdomen soft and non-tender without masses, organomegaly, or hernias noted. No hepatosplenomegaly. Msk:  Back normal, normal gait. Muscle strength and tone normal. Extremities:  No clubbing or cyanosis. Neurologic:  Alert and oriented x 3. Skin:  Intact without lesions or rashes. Psych:  Normal affect.   EKG  Procedure date:  04/18/2010  Findings:      sinus bradycardia 56 bpm, PR 144, QRS 188, Qtc 463, LVH with diffuse TWI  Impression & Recommendations:  Problem # 1:  CHRONIC SYSTOLIC HEART FAILURE (ICD-428.22) The patient has an ischemic CM (EF 30%), NYHA Class II/III CHF, and CAD.  He meets MADIT II/ SCD-HeFT criteria for ICD implantation for primary prevention of sudden death.  Risks, benefits, alternatives to ICD implantation were discussed in detail with the patient today.  He understands that the risks include but are not limited to bleeding, infection, pneumothorax, perforation, tamponade, vascular damage, renal failure, MI, stroke, death, inappropriate shocks, and lead dislodgement. He accepts these risks and wishes to proceed.  We will therefore schedule ICD at the next available time.  He is bradycardic today and therefore should be considered for an atrial lead.  He also wishes to speak with my research team about possible enrollement in the Analyze ST study.  Problem # 2:  TOBACCO ABUSE (ICD-305.1) cessation advised today  Problem # 3:  CARDIOMYOPATHY, ISCHEMIC (ICD-414.8) no ischemic symptoms no changes today  Problem # 4:  HYPERTENSION (ICD-401.9) stable no changes recheck today 136/78  Other Orders: EKG w/ Interpretation (93000)

## 2010-04-30 ENCOUNTER — Telehealth: Payer: Self-pay | Admitting: *Deleted

## 2010-05-02 NOTE — Telephone Encounter (Signed)
Done.    Laurance Flatten, MA

## 2010-05-17 ENCOUNTER — Other Ambulatory Visit: Payer: Self-pay | Admitting: *Deleted

## 2010-05-18 ENCOUNTER — Other Ambulatory Visit (INDEPENDENT_AMBULATORY_CARE_PROVIDER_SITE_OTHER): Payer: BC Managed Care – PPO | Admitting: *Deleted

## 2010-05-18 DIAGNOSIS — Z79899 Other long term (current) drug therapy: Secondary | ICD-10-CM

## 2010-05-18 LAB — CBC WITH DIFFERENTIAL/PLATELET
Basophils Relative: 0.5 % (ref 0.0–3.0)
Eosinophils Absolute: 0.5 10*3/uL (ref 0.0–0.7)
HCT: 41.2 % (ref 39.0–52.0)
Lymphs Abs: 2.4 10*3/uL (ref 0.7–4.0)
MCHC: 34.8 g/dL (ref 30.0–36.0)
MCV: 92.4 fl (ref 78.0–100.0)
Monocytes Absolute: 0.8 10*3/uL (ref 0.1–1.0)
Neutro Abs: 4.5 10*3/uL (ref 1.4–7.7)
Neutrophils Relative %: 55.4 % (ref 43.0–77.0)
RBC: 4.46 Mil/uL (ref 4.22–5.81)

## 2010-05-18 LAB — BASIC METABOLIC PANEL
BUN: 9 mg/dL (ref 6–23)
CO2: 30 mEq/L (ref 19–32)
Chloride: 101 mEq/L (ref 96–112)
Creatinine, Ser: 0.9 mg/dL (ref 0.4–1.5)
Potassium: 4.2 mEq/L (ref 3.5–5.1)

## 2010-05-18 LAB — PROTIME-INR: Prothrombin Time: 11.6 s (ref 10.2–12.4)

## 2010-05-22 ENCOUNTER — Other Ambulatory Visit: Payer: BC Managed Care – PPO | Admitting: *Deleted

## 2010-05-25 ENCOUNTER — Observation Stay (HOSPITAL_COMMUNITY)
Admission: RE | Admit: 2010-05-25 | Discharge: 2010-05-26 | Disposition: A | Discharge status: Home / Self Care | Payer: BC Managed Care – PPO | Source: Ambulatory Visit | Attending: Internal Medicine | Admitting: Internal Medicine

## 2010-05-25 ENCOUNTER — Ambulatory Visit (HOSPITAL_COMMUNITY): Payer: BC Managed Care – PPO

## 2010-05-25 DIAGNOSIS — I472 Ventricular tachycardia, unspecified: Secondary | ICD-10-CM | POA: Insufficient documentation

## 2010-05-25 DIAGNOSIS — Z01818 Encounter for other preprocedural examination: Secondary | ICD-10-CM | POA: Insufficient documentation

## 2010-05-25 DIAGNOSIS — I4729 Other ventricular tachycardia: Secondary | ICD-10-CM | POA: Insufficient documentation

## 2010-05-25 DIAGNOSIS — I10 Essential (primary) hypertension: Secondary | ICD-10-CM | POA: Insufficient documentation

## 2010-05-25 DIAGNOSIS — I714 Abdominal aortic aneurysm, without rupture, unspecified: Secondary | ICD-10-CM | POA: Insufficient documentation

## 2010-05-25 DIAGNOSIS — I2589 Other forms of chronic ischemic heart disease: Secondary | ICD-10-CM | POA: Insufficient documentation

## 2010-05-25 DIAGNOSIS — J4489 Other specified chronic obstructive pulmonary disease: Secondary | ICD-10-CM | POA: Insufficient documentation

## 2010-05-25 DIAGNOSIS — I509 Heart failure, unspecified: Secondary | ICD-10-CM | POA: Insufficient documentation

## 2010-05-25 DIAGNOSIS — F17200 Nicotine dependence, unspecified, uncomplicated: Secondary | ICD-10-CM | POA: Insufficient documentation

## 2010-05-25 DIAGNOSIS — I5022 Chronic systolic (congestive) heart failure: Principal | ICD-10-CM | POA: Insufficient documentation

## 2010-05-25 DIAGNOSIS — I2510 Atherosclerotic heart disease of native coronary artery without angina pectoris: Secondary | ICD-10-CM | POA: Insufficient documentation

## 2010-05-25 DIAGNOSIS — E785 Hyperlipidemia, unspecified: Secondary | ICD-10-CM | POA: Insufficient documentation

## 2010-05-25 DIAGNOSIS — J449 Chronic obstructive pulmonary disease, unspecified: Secondary | ICD-10-CM | POA: Insufficient documentation

## 2010-05-25 LAB — SURGICAL PCR SCREEN: MRSA, PCR: NEGATIVE

## 2010-05-26 ENCOUNTER — Observation Stay (HOSPITAL_COMMUNITY): Payer: BC Managed Care – PPO

## 2010-05-26 LAB — CARDIAC PANEL(CRET KIN+CKTOT+MB+TROPI)
CK, MB: 2.4 ng/mL (ref 0.3–4.0)
Relative Index: 1.7 (ref 0.0–2.5)
Total CK: 144 U/L (ref 7–232)
Troponin I: 0.07 ng/mL — ABNORMAL HIGH (ref 0.00–0.06)

## 2010-05-30 NOTE — Discharge Summary (Signed)
  NAME:  Benjamin Cook, Benjamin Cook             ACCOUNT NO.:  [id number]  MEDICAL RECORD NO.:  [id number]           PATIENT TYPE:  O  LOCATION:  6524                         FACILITY:  [facility]  PHYSICIAN:  Madolyn Frieze. [name], MD, FACCDATE OF BIRTH:  Feb 24, 1945  DATE OF ADMISSION:  05/25/2010 DATE OF DISCHARGE:  05/26/2010                              DISCHARGE SUMMARY   PRIMARY CARDIOLOGIST:  Madolyn Frieze. [name], MD, [facility]  PRIMARY ELECTROPHYSIOLOGIST:  Hillis Range, MD  DISCHARGE DIAGNOSES: 1. Ischemic cardiomyopathy.     a.     Status post successful implantation of a [employer]. Jude Medical      defibrillator (primary prevention of SCD).  SECONDARY DIAGNOSES: 1. Chronic systolic heart failure.     a.     Ejection fraction 30%. 2. Ventricular tachycardia, nonsustained. 3. Multivessel coronary artery disease.     a.     History of percutaneous interventions. 4. Dyslipidemia. 5. Hypertension. 6. Chronic obstructive pulmonary disease/ongoing tobacco. 7. Abdominal aortic aneurysm.  REASON FOR ADMISSION:  The patient is a 65 year old male, with ischemic cardiomyopathy and NYHA class II-III heart failure, recently referred to Dr. Johney Frame for consideration of ICD implantation.  He was admitted for elective procedure.  HOSPITAL COURSE:  The patient underwent successful placement of a St. Jude Medical Fortify ST DR ICD, on day of admission, with no noted complications.  He was kept for overnight observation, and cleared for discharge the following morning in hemodynamically stable condition.   Device interrogation was performed, with no modifications required.  The patient did complain of persistent left shoulder discomfort, and requested continuation of pain medications, at time of discharge.  Wound site remained stable, with no evidence of hematoma.  DISCHARGE LABS:  CPK 144/2.4, troponin I 0.07.  FOLLOWUP CHEST X-RAY:  No pneumothorax.  DISPOSITION:  Stable.  FOLLOWUP: 1. [facility] in 10 days, for wound check. 2. Dr. Olga Millers in 2 weeks. 3. Dr. Hillis Range in 3 months. 4. All arrangements to be made through our office.  DISCHARGE MEDICATIONS: 1. Oxycodone 5 mg q.6 h. p.r.n. 2. Coated aspirin 325 mg daily. 3. Carvedilol 18.75 mg b.i.d. 4. Crestor 40 mg daily. 5. Fish oil 1 gram daily. 6. Furosemide 40 mg daily. 7. Klor-Con 20 mEq daily. 8. Lisinopril 20 mg daily.  DURATION OF DISCHARGE ENCOUNTER:  Greater than 30 minutes, including physician time.     Gene Serpe, PA-C   ______________________________ Madolyn Frieze. [name], MD, [facility]    GS/MEDQ  D:  05/26/2010  T:  05/26/2010  Job:  [id number]  cc:   Gregary Signs A. Everardo All, MD  Electronically Signed by Rozell Searing PA-C on 05/29/2010 08:58:29 PM Electronically Signed by Olga Millers MD [facility] on 05/30/2010 01:34:51 PM

## 2010-06-05 ENCOUNTER — Encounter: Payer: Self-pay | Admitting: Cardiology

## 2010-06-07 ENCOUNTER — Ambulatory Visit: Payer: BC Managed Care – PPO | Admitting: *Deleted

## 2010-06-08 ENCOUNTER — Ambulatory Visit (INDEPENDENT_AMBULATORY_CARE_PROVIDER_SITE_OTHER): Payer: BC Managed Care – PPO | Admitting: Cardiology

## 2010-06-08 ENCOUNTER — Encounter: Payer: Self-pay | Admitting: Cardiology

## 2010-06-08 ENCOUNTER — Telehealth: Payer: Self-pay | Admitting: Cardiology

## 2010-06-08 DIAGNOSIS — I2510 Atherosclerotic heart disease of native coronary artery without angina pectoris: Secondary | ICD-10-CM

## 2010-06-08 DIAGNOSIS — Z4502 Encounter for adjustment and management of automatic implantable cardiac defibrillator: Secondary | ICD-10-CM

## 2010-06-08 DIAGNOSIS — I10 Essential (primary) hypertension: Secondary | ICD-10-CM

## 2010-06-08 DIAGNOSIS — Z95810 Presence of automatic (implantable) cardiac defibrillator: Secondary | ICD-10-CM | POA: Insufficient documentation

## 2010-06-08 DIAGNOSIS — I2589 Other forms of chronic ischemic heart disease: Secondary | ICD-10-CM

## 2010-06-08 DIAGNOSIS — I714 Abdominal aortic aneurysm, without rupture: Secondary | ICD-10-CM

## 2010-06-08 MED ORDER — CARVEDILOL 25 MG PO TABS: 25.0000 mg | ORAL_TABLET | Freq: Two times a day (BID) | ORAL | Status: DC

## 2010-06-08 NOTE — Assessment & Plan Note (Signed)
Continue ACE inhibitor and beta blocker. 

## 2010-06-08 NOTE — Assessment & Plan Note (Signed)
Scheduled followup abdominal ultrasound.

## 2010-06-08 NOTE — Progress Notes (Signed)
[id number] gentleman  with a past medical history of coronary disease, ischemic cardio myopathy, hypertension and hyperlipidemia. Cardiac history dates back to June of 2001. At that time the patient was found to have an abnormal electrocardiogram. Catheterization revealed a 95% mid LAD and a 70% diffuse right coronary artery. His ejection fraction was 25-30%. He had PCI of his LAD at that time. Last cardiac catheterization was performed in April of 2009. At that time he was found to have nonobstructive disease with the most significant being a 60-70% mid right coronary artery. He also had an ejection fraction of 15% with moderate mitral regurgitation. Last echocardiogram was performed in Sept 2010 and showed an ejection fraction of 30%. Abdominal ultrasound in Oct 2011 showed a 3.7 x 3.7 cm aneurysm and dilated iliacs. Followup recommended in six months. I last saw him in Oct of 2011. Patient had ICD placed 4/12. Since then, the patient has dyspnea with more extreme activities but not with routine activities. It is relieved with rest. It is not associated with chest pain. There is no orthopnea, PND or pedal edema. There is no syncope or palpitations. There is no exertional chest pain.  Current Outpatient Prescriptions  Medication Sig Dispense Refill  . aspirin EC 325 MG EC tablet Take 325 mg by mouth daily.        . carvedilol (COREG) 12.5 MG tablet Take 1 and and 1/2 tablet by mouth twice a day      . fish oil-omega-3 fatty acids 1000 MG capsule Take 1 g by mouth daily.        . furosemide (LASIX) 40 MG tablet Take 40 mg by mouth daily.        Marland Kitchen lisinopril (PRINIVIL,ZESTRIL) 20 MG tablet Take 20 mg by mouth daily.        . Multiple Vitamin (MULTIVITAMIN) capsule Take 1 capsule by mouth daily.        . nitroGLYCERIN (NITROSTAT) 0.4 MG SL tablet One tablet under tongue every 5 minutes as needed for chest pain--may repeat 3 times       . potassium chloride SA (K-DUR,KLOR-CON) 20 MEQ tablet Take 20 mEq by  mouth daily.        . rosuvastatin (CRESTOR) 40 MG tablet Take 40 mg by mouth daily.           Past Medical History  Diagnosis Date  . CAD (coronary artery disease)   . Cardiomyopathy, ischemic   . CHF (congestive heart failure)     class II/III  . HTN (hypertension)   . Hyperlipidemia   . COPD (chronic obstructive pulmonary disease)     smokes cigars but has quit cigarettes  . PSA (psoriatic arthritis)     increased  . History of hematuria   . Adrenal mass     per pt this is remote (10 years) and benign by biopsy  . Anxiety   . PVD (peripheral vascular disease)   . Hx of colonoscopy     Past Surgical History  Procedure Date  . Coronary stent placement   . Cataract surgery     History   Social History  . Marital Status: Married    Spouse Name: N/A    Number of Children: N/A  . Years of Education: N/A   Occupational History  . Lawn care    Social History Main Topics  . Smoking status: Current Everyday Smoker    Types: Cigars  . Smokeless tobacco: Never Used   Comment: smokes cigars now,  no longer smokes cigarettes but previously smoked 2ppd  . Alcohol Use: No  . Drug Use: No  . Sexually Active: Not on file   Other Topics Concern  . Not on file   Social History Narrative  . No narrative on file    ROS: no fevers or chills, productive cough, hemoptysis, dysphasia, odynophagia, melena, hematochezia, dysuria, hematuria, rash, seizure activity, orthopnea, PND, pedal edema, claudication. Remaining systems are negative.  Physical Exam: Well-developed well-nourished in no acute distress.  Skin is warm and dry.  HEENT is normal.  Neck is supple. No thyromegaly.  Chest is clear to auscultation with normal expansion. ICD site without evidence of infection. Cardiovascular exam is regular rate and rhythm.  Abdominal exam nontender or distended. No masses palpated. Extremities show no edema. neuro grossly intact

## 2010-06-08 NOTE — Patient Instructions (Signed)
Your physician wants you to follow-up in: 6 MONTHS You will receive a reminder letter in the mail two months in advance. If you don't receive a letter, please call our office to schedule the follow-up appointment.   Your physician has requested that you have an abdominal aorta duplex. During this test, an ultrasound is used to evaluate the aorta. Allow 30 minutes for this exam. Do not eat after midnight the day before and avoid carbonated beverages   INCREASE CARVEDILOL TO 25 MG TWICE DAILY  Your physician recommends that you return for lab work in: WITH DOPPLERS-LIPID/LIVER/BMP-272.0/V58.69/401.1

## 2010-06-08 NOTE — Telephone Encounter (Signed)
Phar has a question about the e-scribe that was sent.  Please call [phone]

## 2010-06-08 NOTE — Assessment & Plan Note (Addendum)
Management per electrophysiology. 

## 2010-06-08 NOTE — Assessment & Plan Note (Signed)
Continue statin. Check lipids and liver. 

## 2010-06-08 NOTE — Assessment & Plan Note (Addendum)
Blood pressure is mildly elevated. Increase Coreg to 25 mg p.o. B.i.d.

## 2010-06-08 NOTE — Assessment & Plan Note (Signed)
Continue present medications. 

## 2010-06-11 ENCOUNTER — Other Ambulatory Visit: Payer: Self-pay | Admitting: Internal Medicine

## 2010-06-11 ENCOUNTER — Encounter (INDEPENDENT_AMBULATORY_CARE_PROVIDER_SITE_OTHER): Payer: BC Managed Care – PPO

## 2010-06-11 ENCOUNTER — Ambulatory Visit: Payer: BC Managed Care – PPO | Admitting: *Deleted

## 2010-06-11 ENCOUNTER — Ambulatory Visit (INDEPENDENT_AMBULATORY_CARE_PROVIDER_SITE_OTHER): Payer: BC Managed Care – PPO | Admitting: *Deleted

## 2010-06-11 DIAGNOSIS — R0989 Other specified symptoms and signs involving the circulatory and respiratory systems: Secondary | ICD-10-CM

## 2010-06-11 DIAGNOSIS — I2589 Other forms of chronic ischemic heart disease: Secondary | ICD-10-CM

## 2010-06-12 NOTE — Op Note (Signed)
NAME:  Benjamin Cook, Benjamin Cook             ACCOUNT NO.:  [id number]  MEDICAL RECORD NO.:  [id number]           PATIENT TYPE:  O  LOCATION:  6524                         FACILITY:  [facility]  PHYSICIAN:  Hillis Range, MD       DATE OF BIRTH:  [birth year]  DATE OF PROCEDURE:  05/25/2010 DATE OF DISCHARGE:                              OPERATIVE REPORT   REFERRING PHYSICIAN:  Madolyn Frieze. [name], MD, FACC  SURGEON:  Hillis Range, MD  PREPROCEDURE DIAGNOSES: 1. Ischemic cardiomyopathy. 2. Coronary artery disease. 3. Chronic systolic dysfunction.  POSTPROCEDURE DIAGNOSES: 1. Ischemic cardiomyopathy. 2. Coronary artery disease. 3. Chronic systolic dysfunction.  PROCEDURES: 1. ICD implantation. 2. Defibrillation threshold testing with programmed extra stimulus     testing also performed for arrhythmia induction.  INTRODUCTION:  Benjamin Cook is a pleasant 65 year old gentleman with a known history of coronary artery disease and an ischemic cardiomyopathy (ejection fraction 30%) with chronic New York Heart Association class II/III congestive heart failure, who now presents for ICD implantation. He is status post prior percutaneous coronary intervention and has been treated with optimal medical therapy.  Despite optimal medical therapies, he has had a persistently depressed ejection fraction.  He meets MADIT II and SCD-HeFT criteria for ICD implantation and therefore presents today for defibrillator placement.  He is also enrolled into the analyze [employer]. Jude Medical Research protocol.  DESCRIPTION OF PROCEDURE:  Informed written consent was obtained and the patient was brought to the Electrophysiology Lab in the fasting state. He was adequately sedated with intravenous Versed and fentanyl as outlined in the nursing report.  The patient's left chest was prepped and draped in the usual sterile fashion by the EP Lab staff.  The skin overlying the left deltopectoral region was infiltrated with  lidocaine for local analgesia.  A 3-cm incision was made over the left deltopectoral region.  A left subcutaneous defibrillator pocket was fashioned using a combination of sharp and blunt dissection. Electrocautery was required to assure hemostasis.  The left axillary vein was cannulated with fluoroscopic visualization.  No contrast was required for this endeavor.  Through the left axillary vein, a St. Jude Medical Tendril STS, model 8730469518 (serial [id number]) right atrial lead and a St. Jude Medical Griffith, model 6213Y-86 (serial [id number]) right ventricular defibrillator lead were advanced with fluoroscopic visualization into the right atrial appendage and right ventricular apex positions respectively.  Initial atrial lead P-waves measured 4 mV with impedance of 631 ohms and a threshold of 0.8 volts at 0.5 milliseconds. Right ventricular lead R-waves measured 11.8 mV with impedance of 648 ohms and a threshold of 0.7 volts at 0.5 milliseconds.  Both leads were secured to the pectoralis fascia using #2 silk suture over the suture sleeves.  Atrial pacing was performed up to 120 beats per minute which revealed the AV Wenckebach cycle length of 120 beats per minute with no aberrant conduction observed.  The leads were then connected to a St. Jude Medical fortified ST DR model F804681 (serial [id number]) ICD.  The pocket was irrigated with copious gentamicin solution.  The defibrillator was then placed into the pocket.  The pocket was then  closed in 2 layers with 2.0 Vicryl suture for the subcutaneous and subcuticular layers.  Steri-Strips and a sterile dressing were then applied.  No contrast was required for the procedure today.  Defibrillation threshold testing was then performed.  Ventricular fibrillation was induced with a T-wave shock.  Adequate sensing of ventricular fibrillation was observed with no dropout with a least sensitivity programmed from the RV sensing lead.   Ventricular fibrillation was successfully detected and defibrillated with a single 15-joule shock delivered with an impedance of 73 ohms and a duration of 4 seconds.  The patient remained in sinus rhythm thereafter.  The patient was allowed to recover for 4 minutes.  Programmed extra stimulus testing was then performed through the device from the right ventricular apex with a basic cycle length of 500 milliseconds with S1, S2, S3, S4 extra stimuli.  Ventricular tachycardia was induced when pacing at 500/260/240/230.  This tachycardia was of a right bundle-branch, left superior axis with a cycle length of 255 milliseconds.  The tachycardia was adequately detected and successfully terminated with a single cycle train of burst antitachycardia pacing with 85% cycle length programmed. The patient was observed without any further arrhythmias.  There were no early apparent complications.  CONCLUSIONS: 1. Successful implantation of a St. Jude Medical fortified ST DR ICD     for primary prevention of sudden cardiac death. 2. DFT less than or equal to 15 joules. 3. Right bundle-branch, left superior axis, ventricular tachycardia     was induced today with a cycle length of 255 milliseconds with     programmed extra stimulus testing and was successfully terminated     with antitachycardia pacing at 85% of the cycle length. 4. No early apparent complications.     Hillis Range, MD     JA/MEDQ  D:  05/25/2010  T:  05/26/2010  Job:  [id number]  cc:   Madolyn Frieze. [name], MD, [facility] A. Everardo All, MD  Electronically Signed by Hillis Range MD on 06/12/2010 08:15:08 PM

## 2010-06-13 ENCOUNTER — Telehealth: Payer: Self-pay | Admitting: Cardiology

## 2010-06-13 NOTE — Telephone Encounter (Signed)
Coreg pres.  Has a question regarding dosage.  Call number on contact to confirm.

## 2010-06-13 NOTE — Telephone Encounter (Signed)
I called pharmacy to answer the questions they had a bout the coreg dosage/directions they told me someone told them to cancel that prescription so I said ok

## 2010-06-15 ENCOUNTER — Other Ambulatory Visit: Payer: Self-pay

## 2010-06-15 ENCOUNTER — Telehealth: Payer: Self-pay | Admitting: Cardiology

## 2010-06-15 NOTE — Telephone Encounter (Signed)
Pharmacy called to clarify directions on coreg

## 2010-06-19 NOTE — Assessment & Plan Note (Signed)
[facility]                            CARDIOLOGY OFFICE NOTE   Benjamin Cook, Benjamin Cook                    MRN:          [id number]  DATE:04/29/2007                            DOB:          04/19/1945    Benjamin Cook is a very pleasant 65 year old gentleman with past medical  history of coronary disease, skin cardiomyopathy, hypertension,  hyperlipidemia who I am asked to evaluate for the above reasons.  The  patient's cardiac history dates back to June 2001.  At that time he had  a routine followup with Dr. Everardo All and was found to have an abnormal  electrocardiogram worsened for recent anterior infarct.  He was admitted  and underwent cardiac catheterization by Dr. Eden Emms.  He was found to  have a 95% mid LAD.  There was a 70% diffuse distal RCA.  His ejection  fraction was 25-30%.  The patient subsequently had PCI of his LAD with a  stent.  He did have a follow-up cardiac catheterization on November 02, 1999.  His ejection fraction was 35%.  There was mid anterior akinesis  and dyskinesis at the level of the apex in the distal inferior wall.  He  had no other obstructive disease noted.  The worst lesion was a 40-50%  mid RCA.  His most recent Myoview was performed on September 14, 2002.  His  ejection fraction was noted to be 36%.  There was a prior extensive  apical and distal septal infarct, but there was no ischemia.  He has not  been seen in this office since 2001.  He was recently seen by Dr.  Everardo All and referred back for further management of his coronary  disease, ischemic cardiomyopathy, hypertension, hyperlipidemia.  Note he  denies any orthopnea, PND, pedal edema, palpitations, presyncope,  syncope or exertional chest pain.  He can have dyspnea with more extreme  activities.  He has had no syncope.   MEDICATIONS:  He is unclear about what medications he is taking today.   ALLERGIES:  There are no known drug allergies.   SOCIAL HISTORY:   He does smoke, but he is working on quitting.  He does  not consume alcohol.   FAMILY HISTORY:  Negative for coronary artery disease.   PAST MEDICAL HISTORY:  Significant for hypertension and hyperlipidemia.  There is no diabetes mellitus.  He has a history of coronary artery  disease as outlined in the HPI.  He has had problems with his right eye,  and is approaching near blindness in that eye now.  There are no other  past surgeries other than right eye surgery.   REVIEW OF SYSTEMS:  He denies any headaches or fevers or chills.  There  is no productive cough or hemoptysis.  There is no dysphagia,  odynophagia, melena or hematochezia.  There is no dysuria, hematuria,  rash or seizure activity.  There is no orthopnea, PND or pedal edema.  There is no claudication noted.  Remaining systems are negative.   PHYSICAL EXAMINATION:  VITAL SIGNS:  Today shows blood pressure 149/78,  pulse 72, weighs 165 pounds.  GENERAL:  He is well-developed, well-nourished in no acute distress.  He  does not appear depressed.  SKIN:  Warm and dry.  There is no peripheral clubbing.  BACK:  Normal.  HEENT:  Significant for lateral deviation of his right eye and near  blindness.  His eyelids are normal.  There are no other abnormalities in  his HEENT noted.  NECK:  Supple, no bruits noted.  There is no jugular venous distention.  I cannot appreciate thyromegaly.  CHEST:  Clear to auscultation with normal expansion.  CARDIOVASCULAR:  Regular rate and rhythm.  Normal S1, S2.  There are no  murmurs, rubs or gallops noted.  ABDOMEN:  Nontender, nondistended with positive bowel sounds.  No  hepatosplenomegaly, no mass appreciated.  There is a soft mid abdominal  bruit.  He has 2+ femoral pulses bilaterally.  No bruits.  EXTREMITIES:  Show no edema that I could palpate, no cords.  He has 2+  posterior tibial pulses bilaterally.  NEUROLOGIC:  Grossly intact.   His electrocardiogram shows a sinus rhythm at a  rate of 77.  There is a  prior septal infarct with lateral T-wave inversion.  A prior inferior  infarct cannot be excluded.   DIAGNOSES:  1. Coronary artery disease - Benjamin Cook is not having chest pain or      shortness of breath, but he really had no symptoms at the time of      his previous infarct.  We will schedule him to have a stress      Myoview both to exclude ischemia and also to quantify his left      ventricle function.  If his ejection fraction is 35% or less, then      he will need to be seen by electrophysiology for consideration of      ICD.  2. Skin cardiomyopathy - it is not clear what medications Benjamin Cook      is on at this point.  I will have him contact us with a list of his      medications.  It does not appear that he is on an ACE inhibitor or      beta blocker, and if not we will add those.  He will also need to      continue on an aspirin and a statin, but again we will need to      review his medications before changing.  3. Hypertension - his blood pressure is mildly elevated today.  We      will adjust based on his home medications.  I would like for him to      be on full dose ACE inhibitor and beta blocker if possible.  4. Hyperlipidemia - we will have his most recent lipids and liver      forwarded to [place] for our records as well as his most recent BMET to      follow his renal function.  5. Tobacco abuse - he is working on discontinuing this, and we      discussed the importance of this.   We will see him back in four weeks both to titrate medications and to  review his Myoview.  Again he may need consideration of ICD if his left  ventricular function is significantly reduced.     Madolyn Frieze [name], MD, [facility]  Electronically Signed   BSC/MedQ  DD: 04/29/2007  DT: 04/29/2007  Job #: [id number]   cc:   Gregary Signs A. Everardo All,  MD

## 2010-06-19 NOTE — Assessment & Plan Note (Signed)
[facility]                            CARDIOLOGY OFFICE NOTE   Benjamin, Cook                    MRN:          [id number]  DATE:06/01/2007                            DOB:          [birth year]    Mr. Benjamin Cook returns for followup today.  I last saw him on April 29, 2007.  He has a history of coronary disease, ischemic cardiomyopathy,  hypertension, hyperlipidemia.  When I last saw him we added an ACE  inhibitor and a beta blocker and scheduled him to have a Myoview.  However, he did not have his Myoview due to some confusion.  He has had  some fatigue in the past week but his daughter attributes this to him  mowing lawns and being in the pollen.  There is no orthopnea, PND, pedal  edema, palpitations, presyncope, syncope or chest pain.  His medications  include aspirin 81 mg daily, carvedilol 3.25 mg p.o. b.i.d., lisinopril  10 mg daily, and multivitamin.  He also is on Advair as needed and  methocarbamol.   PHYSICAL EXAM:  Today shows a blood pressure of 145/86.  His pulse is  68.  Weight is 163 pounds.  HEENT:  Normal.  Neck is supple with no bruits.  CHEST:  Clear.  CARDIOVASCULAR:  Regular rhythm.  His abdominal exam shows no tenderness.  EXTREMITIES:  Show no edema.   Electrocardiogram today shows sinus rhythm at a rate of 70.  There is a  prior septal and inferior infarct.  There is left ventricle hypertrophy  with lateral T-wave changes.   DIAGNOSES:  1. Coronary artery disease - we will reschedule Mr. Benjamin Cook's Myoview      both to exclude ischemia and also to quantify his LV function.  If      his ejection fraction is 35% or less, then he will need an EP      consult for consideration of ICD.  He will continue on his aspirin      and I am adding a statin today as well.  Also continue on beta      blocker and his ACE inhibitor.  2. Ischemic cardiomyopathy - we will increase his lisinopril to 20 mg      daily his Coreg to 6.25  mg p.o. b.i.d.  We will continue to titrate      this as tolerated by pulse and blood pressure.  3. Hypertension - we are increasing his blood pressure medicines as      described above.  4. Hyperlipidemia - we will begin statin and check lipids and liver in      6 weeks.  Note, since we are also increasing his lisinopril.  I      will check a BMET when returns his Myoview.  5. Tobacco abuse - we discussed importance of discontinuing this.   He will see me back in 3 months.    Benjamin Frieze [name], MD, [name]  Electronically Signed   BSC/MedQ  DD: 06/01/2007  DT: 06/01/2007  Job #: [id number]   cc:   Benjamin Signs A. Everardo All, MD

## 2010-06-19 NOTE — Consult Note (Signed)
NAME:  Benjamin Cook, Benjamin Cook             ACCOUNT NO.:  [id number]   MEDICAL RECORD NO.:  [id number]          PATIENT TYPE:  INP   LOCATION:  6532                         FACILITY:  [facility]   PHYSICIAN:  Rollene Rotunda, MD, FACCDATE OF BIRTH:  [birth year]   DATE OF CONSULTATION:  06/04/2007  DATE OF DISCHARGE:                                 CONSULTATION   PRIMARY CARE PHYSICIAN:  Sean A. Everardo All, MD   CARDIOLOGIST:  Madolyn Frieze. [name], MD, [facility]   REASON FOR PRESENTATION:  Evaluate the patient with cardiomyopathy and  acute pulmonary edema.  He also had previous coronary disease with  stenting of his LAD, and PTCA of obtuse marginal.   PROCEDURE NOTE:  Left heart catheterization performed via the right  femoral artery, right heart catheterization performed via the right  femoral vein.  Both vessels are cannulated using anterior wall puncture .  A 6-French  arterial sheath and a 7-French venous sheath were inserted with modified  Seldinger technique.  Preformed Judkins and a pigtail catheter were  utilized.  The patient tolerated the procedure well and left the lab in  stable condition.   RESULTS:  Hemodynamics RA mean 4, RV 38/1 with a mean of 5.  PA 40/12  with a mean of 24, and pulmonary capillary wedge pressure mean 16, LV  139/15, and AL 133/75.  Coronaries, the left main had distal 25%  stenosis.  The LAD was a large vessel wrapping the apex.  There were  diffuse 25% in-stent restenosis.  There was mid 30% stenosis mid  diagonal.  Following this, there was a patent stent with mild in-stent  luminal irregularities.  There was a mid diagonal, which was large with  ostial 30% stenosis and mid long 40% stenosis.  Circumflex in the AV  groove had proximal luminal irregularities.  There was a first obtuse  marginal which was very small and normal.  Second obtuse marginal was a  large bifurcating vessel, there was long proximal 25% stenosis.  The  inferior branch had diffuse 25% stenosis.   The right coronary artery was  a dominant vessel.  There was moderate diffuse proximal and mid disease.  There was a long proximal 30% stenosis.  There was mid diffuse 25-30%  stenosis.  There was a focal 60-70% mid lesion.  PDA was moderate sized  with luminal irregularities.  Posterolateral was moderate sized with  luminal irregularities.   Left ventriculogram:  Left ventriculogram was obtained in the RAO  projection.  The EF was severely depressed at about 15% with global  hypokinesis.  There was moderate mitral regurgitation.   CONCLUSION:  Moderate nonobstructive disease.  I did review the right  coronary artery with Dr. Excell Seltzer.  I took multiple views.  It does seem  to be the tightest focal lesion but unlikely to be causing acute  symptoms.  It will be a difficult vessel to treat percutaneously as it  is diffusely diseased.  The overall picture is one of a severe  cardiomyopathy rather than an acute ischemic event.   PLAN:  At this point, we are going to manage this  patient medically and  aggressively.  We can consider PCI of the right coronary if he has  recurrent acute symptoms despite maximum medical therapy.      Rollene Rotunda, MD, [facility]  Electronically Signed     JH/MEDQ  D:  06/04/2007  T:  06/05/2007  Job:  [id number]   cc:   Gregary Signs A. Everardo All, MD

## 2010-06-19 NOTE — Discharge Summary (Signed)
NAME:  Benjamin Cook, Benjamin Cook             ACCOUNT NO.:  [id number]   MEDICAL RECORD NO.:  [id number]          PATIENT TYPE:  INP   LOCATION:  2011                         FACILITY:  [facility]   PHYSICIAN:  Learta Codding, MD,FACC DATE OF BIRTH:  02-25-1945   DATE OF ADMISSION:  06/03/2007  DATE OF DISCHARGE:  06/06/2007                         DISCHARGE SUMMARY - REFERRING   ADMITTING PHYSICIAN:  Dr. Diona Browner.   DISCHARGING PHYSICIAN:  Dr. Andee Lineman   DISCHARGE DIAGNOSES:  1. Acute systolic congestive heart failure.  2. Nonischemic cardiomyopathy with ejection fraction of 10-15% by      echocardiogram.  3. Coronary artery disease out of proportion to ventricular      dysfunction.  4. Chronic obstructive pulmonary disease.  5. Hilar adenopathy.  6. Continued tobacco use.  7. Hyperlipidemia.  8. Hypertension.  9. History of abdominal aortic aneurysm.  10.History as previously.   PROCEDURES PERFORMED:  Right and left cardiac catheterization by Dr.  Antoine Poche on June 04, 2007.   SUMMARY OF HISTORY:  Benjamin Cook is a 65 year old male who, despite  seeing Dr. [name] in the office on June 01, 2007, Benjamin Cook has developed  progressive dyspnea on exertion.  Over the preceding several weeks,  despite medication adjustments, this has not improved.  Since his doctor  visit Benjamin Cook has developed orthopnea, PND and abdominal fullness.  Thus, Benjamin Cook  presented to the emergency room for further evaluation.  Benjamin Cook initially  received Lasix with improvement of his symptoms.  Benjamin Cook was admitted for  further treatment.   Past medical history is notable for hypertension, ongoing tobacco use,  hyperlipidemia, abdominal aortic aneurysm, adrenal mass with  unremarkable workup, right-eye blindness with prior surgery, coronary  artery disease with a bare-metal stent to the LAD in 2001, medical  treatment of moderate disease; however, his EF was approximately 35%.   LABORATORY DATA:  Chest x-ray on April 29 revealed possible  COPD, mild  cardiomegaly, findings concerning for pulmonary edema.  Chest CT was  performed and did not reveal evidence of pulmonary embolus.  There was  LVH, cardiomegaly, focal inferior thinning, bilateral hilar adenopathy  without definitive cause, emphysema and bullous disease; malignancy such  as lymphoma could not be excluded.  Weight on April 29 was 69.93 kg; at  the time of discharge was 69.4 kg.  Admission H&H was 15.5 and 45.7,  normal indices, platelets 271, wbc's 8.8.  PT 31, PTT 13.2.  D-dimer  1.59.  Sodium 137, potassium 4.3, BUN 12, creatinine 0.95, glucose 103.  LFTs on April 30 were within normal limits.  At the time of discharge  sodium is 137, potassium 3.8, BUN 19, creatinine 0.98, glucose 90.  CK-  MB, relative index and troponin were within normal limits x4.  Admission  BNP was elevated at 832.  Fasting lipids showed a total cholesterol 240,  triglycerides 102, HDL 31, LDL 189.  TSH 1.213.  Urinalysis showed a  trace amount of blood.  EKG showed normal sinus rhythm, normal axis,  bilateral atrial enlargement, LVH, nonspecific ST-T wave changes.   HOSPITAL COURSE:  Benjamin Cook was evaluated initially in the emergency  room by Theodore Demark, PA-C, along with Dr. Diona Browner.  Benjamin Cook was admitted  to the hospital for further treatment.  Benjamin Cook was placed on IV heparin as  well as IV Lasix.  D-dimer was elevated.  Thus, CT was performed.  Dr.  [name] on April 30 titrated medications in regards to his lisinopril  and Coreg.  Benjamin Cook changed his Zocor to Lipitor given his cholesterol status  and again was referred for tobacco cessation.  Dr. [name] noted that  Benjamin Cook felt that Benjamin Cook should be followed up with pulmonary given adenopathy on  CT scan, as well as repeat ultrasound for his known AAA.  Echocardiogram  was also performed on April 30 and revealed an ejection fraction of 10-  15% with severe diffuse left ventricular hypokinesis and mild LVH, mild  aortic valve thickness increased,  mild MR, moderate left atrial  enlargement, and decreased RV function.  Right and left cardiac  catheterization was performed on June 04, 2007, by Dr. Antoine Poche.  It  was felt that his coronary disease was out of proportion to his LV  dysfunction and was recommended medical management.  By May 1 his  diuresis had improved.  Dr. [name] continued to adjust medications.  Cardiac rehab assisted with ambulation and education.  By Jun 06, 2007,  Dr. Andee Lineman felt that the patient could be discharged home.   DISPOSITION:  The patient is discharged home on Jun 06, 2007, asked to  maintain a low-sodium heart-healthy diet.  Wound care and activities are  per supplemental sheet.   Benjamin Cook received new prescriptions for the following medications:  1. Lasix 40 mg daily.  2. Potassium 20 mEq daily.  3. Lisinopril 40 mg daily.  4. Coreg 12.5 mg b.i.d.  5. Lipitor 80 mg q.h.s.  6. Nitroglycerin 0.4 as needed.   Benjamin Cook was asked to increase his aspirin to 325 mg daily.  Benjamin Cook was asked to  discontinue Zocor.   Benjamin Cook was advised no smoking or tobacco products.  The office will call him  with arrangements for an abdominal ultrasound to follow up on his  abdominal aortic aneurysm, as well as an appointment with pulmonology to  follow up on his COPD and hilar adenopathy.  They will also inform him  of 2- to 4-week appointment with Dr. [name] and blood work within the  next week with a BMET to reassess potassium and kidney function given  the medication  changes.  Benjamin Cook will also need blood work in 6-8 weeks given the initiation  of Lipitor in regards to LFTs and FLP.  Benjamin Cook was asked to maintain daily  weights, bring all weights and all medications to all appointments.   Discharge time 40 minutes.      Joellyn Rued, PA-C      Learta Codding, MD,FACC  Electronically Signed    EW/MEDQ  D:  06/06/2007  T:  06/06/2007  Job:  [id number]   cc:   Madolyn Frieze. [name], MD, [facility]  Sean A. Everardo All, MD

## 2010-06-19 NOTE — Assessment & Plan Note (Signed)
[facility]                            CARDIOLOGY OFFICE NOTE   Benjamin Cook, Benjamin Cook                    MRN:          [id number]  DATE:08/04/2007                            DOB:          11-Apr-1945    Mr. Mcminn is a pleasant gentleman who I last saw on June 01, 2007.  He has a history of coronary artery disease, ischemic cardiomyopathy,  hypertension, and hyperlipidemia.  After I saw him, the patient was  admitted to [facility] with dyspnea.  He underwent cardiac  catheterization and his ejection fraction was found to be severely  reduced.  He was found to have nonobstructive coronary artery disease  and medical therapy was recommended.  An echocardiogram showed an  ejection fraction of 10-15% with mild LVH, mild mitral regurgitation as  well as left atrial enlargement.  Note, TSH and ferritin were normal.  He also had a chest CT secondary to elevated D-dimer.  There was no  pulmonary embolus, but there was bilateral hilar adenopathy as well as  emphysema and bullous changes.  Malignancy such as lymphoma could not be  excluded.  The patient was placed on medications and discharged.  Note,  he also had a followup ultrasound of his abdomen after discharge for an  aneurysm.  This was performed on Jun 19, 2007, and measured 3.4 x 3.3  cm.  There was also a proximal common iliac artery aneurysm less than 2  cm.  Since discharge, he has done well.  He denies any dyspnea, chest  pain, palpitations, or syncope and there is no pedal edema.   MEDICATIONS AT PRESENT:  1. Aspirin 81 mg p.o. daily.  2. Vitamin.  3. Lipitor 80 mg p.o. daily.  4. Potassium 20 mEq p.o. daily.  5. Carvedilol 6.25 mg p.o. b.i.d.  6. Lisinopril 20 mg p.o. daily.  7. Zocor 40 mg p.o. daily.  8. Lasix 40 mg p.o. daily.   PHYSICAL EXAMINATION:  VITAL SIGNS:  A blood pressure of 138/70 and his  pulse is 70.  He weighs 58 pounds.  HEENT:  Normal.  NECK:  Supple.  No  bruits.  CHEST:  Clear.  CARDIOVASCULAR:  Regular rate and rhythm.  ABDOMEN:  No tenderness.  EXTREMITIES:  No edema.   DIAGNOSES:  1. Severe nonischemic cardiomyopathy - we will continue with his      carvedilol, but I will increase this to 12.5 mg p.o. b.i.d. with an      ultimate goal of 25 mg p.o. b.i.d.  He will continue on his      angiotensin-converting enzyme inhibitor as well.  We will also      continue with his Lasix and potassium and I will check a basic      metabolic panel today to follow his potassium and renal function.  2. Coronary artery disease - we will continue on his aspirin, statin,      beta-blocker, and angiotensin-converting enzyme inhibitor.  3. Abdominal aortic aneurysm - he will need a repeat ultrasound of his      abdomen in May 2010.  4. Hilar  adenopathy - we will arrange him to see one of our      pulmonologists concerning his adenopathy as well as his chronic      obstructive pulmonary disease.  5. Hyperlipidemia - he is on 2 statins and I have asked him to      discontinue his Zocor.  We will check lipids and liver and adjust      as indicated.  6. Hypertension - his blood pressure is out of control and we are      increasing his Coreg for his cardiomyopathy.  7. Tobacco abuse - he has discontinued this.   We will see him back in approximately 4 weeks and we will increase his  Coreg further and his ACE inhibitor as tolerated.  Our ultimate goal  will be once his medications are titrated, we will plan to repeat his  echocardiogram.  If his ejection fraction is less than 30% despite  medication adjustments, then he will need an EP evaluation for ICD.     Madolyn Frieze [name], MD, [facility]  Electronically Signed    BSC/MedQ  DD: 08/04/2007  DT: 08/05/2007  Job #: [id number]

## 2010-06-19 NOTE — H&P (Signed)
NAME:  Benjamin Cook, Benjamin Cook NO.:  [id number]   MEDICAL RECORD NO.:  [id number]          PATIENT TYPE:  INP   LOCATION:  6532                         FACILITY:  [facility]   PHYSICIAN:  Jonelle Sidle, MD DATE OF BIRTH:  10-15-1945   DATE OF ADMISSION:  06/03/2007  DATE OF DISCHARGE:                              HISTORY & PHYSICAL   PRIMARY CARDIOLOGIST:  Dr. Olga Millers.   PRIMARY CARE PHYSICIAN:  Dr. Romero Belling.   REASON FOR ADMISSION:  Decompensated congestive heart failure with  fairly acute pulmonary edema.   HISTORY OF PRESENT ILLNESS:  Mr. Benjamin Cook is a pleasant 65 year old  gentleman just recently seen in the office by Dr. [name] on June 01, 2007.  He has a history of known coronary artery disease status post  previous bare-metal stent placement to the left anterior descending back  in 2001 with otherwise moderate disease in the right coronary artery  that has been managed medically.  He has a left ventricular ejection  fraction of approximately 35% and has been experiencing somewhat  progressive dyspnea on exertion but no frank chest pain over the last  several weeks.  He underwent some medication adjustments at his recent  visit and was scheduled to follow up for a Myoview to assess for  ischemic burden and reevaluation of ejection fraction.  He states that  yesterday he did some work outside (stone work) and overall felt okay  until this morning after he awoke.  He states that he was more short of  breath and developed new orthopnea and PND with a general sense of  fullness in his abdomen and came to the emergency department.  He has  been treated with Lasix and is beginning to feel better.  Hemodynamically he has been stable and his chest x-ray demonstrates  evidence of mild to moderate pulmonary edema with chronic obstructive  pulmonary disease changes.  He has not yet made the suggested changes in  his medications.  His initial point of care  cardiac markers revealed a  troponin I of less than 0.05 and a CK-MB of 2.2.  D-dimer is 1.59.  There is no BNP level.   ALLERGIES:  NO KNOWN DRUG ALLERGIES.   MEDICATIONS AT HOME:  Include aspirin 81 mg p.o. daily, lisinopril 20 mg  p.o. daily, Coreg 6.25 mg p.o. b.i.d., Robaxin 500 mg p.o. p.r.n. and  Zocor 40 mg p.o. q.h.s.   PAST MEDICAL HISTORY:  As outlined above.  The patient also has an  additional history of hypertension, tobacco abuse, hyperlipidemia, and  an abdominal aortic aneurysm measuring 4 x 3.2 centimeters with planned  follow-up ultrasound over the next week.  He also has a history of an  adrenal mass with a reportedly unrevealing workup.  He is blind in his  right eye and has a prior history of eye surgery.   SOCIAL HISTORY:  The patient lives in [place] with his wife.  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  He has an active  tobacco use history and has been trying to quit over the  last few days.  Denies any alcohol use.   FAMILY HISTORY:  Significant for death of the patient's mother in her  100s with a history of hypertension and alcohol use.  The patient's  father is living in his 39s.   REVIEW OF SYSTEMS:  As outlined above.  He has had no fevers or chills.  He has been progressively more short of breath over the last few weeks  with activity.  No frank exertional chest pain.  He has recent onset  orthopnea and PND as of this morning.  He does have a nonproductive  cough and some wheezing usually in the evenings.  He has a sense of  general lower abdominal discomfort and increasing girth, and has had  some lower extremity edema.   On examination, temperature is 97.2 degrees, heart rate 90, respirations  22, blood pressure 122/72, oxygen saturation is 96% on room air.  This is a chronically ill-appearing male in no acute distress, talking  and is breathing more easily.  HEENT: Conjunctivae, lids grossly normal.  Oropharynx clear.  Poor   dentition.  NECK:  Supple.  There is elevated jugular venous pressure.  No loud  bruits.  No thyromegaly.  LUNGS:  Exhibit fine crackles towards the bases.  Respiratory effort is  mildly labored.  No active wheezing.  CARDIAC EXAM:  Reveals a regular rate and rhythm.  Soft systolic murmur.  No obvious S3 gallop or pericardial rub over ambient noise in the  emergency department.  ABDOMEN:  Soft, nontender.  Bowel sounds present.  EXTREMITIES:  Exhibit trace edema.  Distal pulses 1+.  SKIN:  Warm and dry.  MUSCULOSKELETAL:  No kyphosis noted.  NEUROPSYCHIATRIC:  The patient is alert and oriented x3.  Affect is  appropriate.   LABORATORY DATA:  WBC is 8.8, hemoglobin 15.5, hematocrit 45.7,  platelets 271, D-dimer 1.59.  Sodium 137, potassium 4.3, chloride 102,  bicarb 26, BUN 12, creatinine 0.9.  The point of care troponin I less is  than 0.05.  Myoglobin 69, CK-MB 2.2.  Urinalysis with trace blood.   Chest x-ray from June 03, 2007 demonstrates findings consistent with  mild-to-moderate interstitial edema and chronic obstructive pulmonary  disease changes.   Electrocardiogram demonstrates changes consistent with left ventricular  hypertrophy and repolarization abnormalities, somewhat more prominent in  the anterolateral leads.   IMPRESSION:  1. Progressive dyspnea on exertion culminating in new onset orthopnea      and paroxysmal nocturnal dyspnea and findings of acute pulmonary      edema by chest x-ray.  Given the patient's history of ischemic      cardiomyopathy and previous bare-metal stent placement in the left      anterior descending, ischemia is a likely cause of his      decompensation.  He was scheduled to undergo an outpatient Myoview      although has now presented to the emergency department with      progressive symptoms.  His initial point of care markers are normal      and his electrocardiogram is overall nonspecific.  Suspect that the      elevated D-dimer is  nonspecific as pulmonary embolus is doubted      clinically at this point.  2. History of ongoing tobacco abuse, recently quit.  3. Hyperlipidemia, recently placed on Zocor.  4. Hypertension.  5. A history of ischemic cardiomyopathy with an ejection fraction of      35%.  6. Known abdominal aortic aneurysm  with measurements of 4 x 3.2 cm,      scheduled to have a follow-up abdominal ultrasound over the next 1-      2 weeks.   PLAN:  The patient will now be admitted to the hospital.  We will plan  to continue aspirin, lisinopril, Coreg and Zocor and institute heparin  as we cycle cardiac markers.  He will be diuresed.  Given his relatively  acute  decompensation, I would anticipate a diagnostic cardiac catheterization  rather than a Myoview at this point.  This will be tentatively scheduled  for tomorrow.  An abdominal aortogram can be done at the same time to  follow-up aneurysm.  Further plans to follow.      Jonelle Sidle, MD  Electronically Signed     SGM/MEDQ  D:  06/03/2007  T:  06/03/2007  Job:  [id number]   cc:   Madolyn Frieze. [name], MD, [facility]  Sean A. Everardo All, MD

## 2010-06-22 NOTE — Procedures (Signed)
[place]  Patient:    ISAY, PERLEBERG                      MRN: [id number] Proc. Date: 08/01/99 Attending:  Noralyn Pick. Eden Emms, M.D. [facility] CC:         Justine Null, M.D. [facility]             Echocardiography Lab                           Procedure Report  PROCEDURE:  Echocardiogram, 2D.  INDICATIONS:  Abnormal electrocardiogram.  Question anterior wall MI versus cardiomyopathy.  DESCRIPTION OF PROCEDURE:  2D Doppler and color images were obtained.  There was moderate left ventricular cavity enlargement.  There was distal septal and apical akinesis.  There was no mural thrombus.  There was also moderate global hypokinesis.  The ejection fraction was in the 30% range.  Mitral annulus was mildly dilated with mild MR.  There was mild bi-atrial enlargement.  The right ventricle was normal.  The aortic valve was trileaflet and sclerotic.  There was no aortic stenosis.  Subcostal imaging revealed no atrial septal defect.  FINAL IMPRESSION: 1. A moderate LV dysfunction with EF of 30% with distal septal and apical    akinesis and global hypokinesis. 2. Mild MR. 3. Aortic valve sclerosis. 4. Normal right ventricle. 5. No source of embolus. 6. No effusion. DD:  08/01/99 TD:  08/02/99 Job: [id number]

## 2010-06-22 NOTE — Cardiovascular Report (Signed)
[place]  Patient:    Benjamin Cook, Benjamin Cook                    MRN: [id number] Proc. Date: 08/02/99 Adm. Date:  [id number] Attending:  [place]ber [place] Dictator:   Luretha Rued Ezzard Standing, P.A.                        Cardiac Catheterization  NO DICTATION DD:  08/02/99 TD:  08/03/99 Job: [id number]

## 2010-06-22 NOTE — Cardiovascular Report (Signed)
. [facility]  Patient:    Benjamin Cook, Benjamin Cook                    MRN: [id number] Proc. Date: 11/02/99 Adm. Date:  [id number] Attending:  Learta Codding CC:         Justine Null, M.D. [facility]  Noralyn Pick. Eden Emms, M.D. [facility]   Cardiac Catheterization  PROCEDURE: 1. Left heart catheterization with selective coronary angiography. 2. Ventriculography.  DIAGNOSES: 1. No evidence for restenosis of prior PTCA site. 2. Diffuse nonobstructive coronary artery disease. 3. Moderate to severe left ventricular systolic dysfunction. 4. Elevated left ventricular end diastolic pressure consistent with altered    compliance.  DESCRIPTION OF PROCEDURE:  After informed consent was obtained, the patient was brought to the catheterization laboratory.  The right groin was sterilely prepped and draped.  1% lidocaine was used to infiltrate the right groin.  6 French arterial sheath was placed using modified Seldinger technique. Subsequently, 6 [place] and JR4 preformed catheters were used to engage the left and right coronary arteries.  Selective angiography was performed in various projections using manual injections of contrast.  After selective coronary angiography, ventriculography was performed using a 6 French angled pigtail catheter placed in the ventricle.  Appropriate left-sided hemodynamics were obtained.  Ventriculography was performed using power injections of contrast.  After coronary angiography, pigtail catheter was removed.  At the termination of the case, the catheter and sheath were removed and manual pressure applied until adequate hemostasis was achieved.  The patient tolerated the procedure well and was transferred to the holding area in stable condition.  FINDINGS:  HEMODYNAMICS:  Left ventricular pressure 143/27 mmHg.  Aortic pressure 143/80 mmHg.  VENTRICULOGRAPHY:  Ejection fraction quantified at 35%.  There was an area of midanterior  akinesis.  In addition there was an area of dyskinesis at the level of the apex in the distal inferior wall.  SELECTIVE CORONARY ANGIOGRAPHY:  Left coronary artery was free of flow-limiting disease.  The left anterior descending artery was a large vessel giving rise to two diagonal branches, the second diagonal branch being a moderate size bifurcating vessel.  There was approximately 20 to 30% stenosis at the level of the proximal diagonal branch.  At the site of the prior coronary intervention just beyond the bifurcation of the second diagonal, there was approximately 20 to 30% restenosis.  Of note is that the LAD is wrapping around the apex providing flow to a large part of the inferior wall.  Left circumflex coronary artery.  A moderate size vessel giving rise to a first small obtuse marginal branch and a larger second obtuse marginal branch. There is approximately 20 to 30% focal stenosis of the mid circumflex.  Right coronary artery.  Right coronary artery is a large caliber vessel giving rise to a posterior descending artery and two posterolateral branches.  There is diffuse disease of the proximal and midvessel, but no evidence for flow-limiting disease.  There is a more focal lesion in the midvessel of approximately  40 to 50% stenosis.  The remainder of the vessel, particularly the distal right coronary artery is free of flow-limiting disease.  CONCLUSION: 1. No significant flow-limiting coronary artery disease with a 40 to    50% mid RCA stenosis. 2. Moderate to severely decreased LV contraction with segmental wall    motion abnormalities as outlined above. 3. Elevated left ventricular end diastolic pressure consistent with altered    compliance.  RECOMMENDATIONS:  Results have been reviewed with Dr. Eden Emms.  Segmental wall motion abnormalities as an inferoapical dyskinetic area is likely due to his prior anterior wall myocardial infarction.  By angiographic imaging,  there does not appear to be a flow-limiting lesion in the right coronary artery. The plan is to continue to treat the patient with medical therapy and he will follow up with Dr. Eden Emms in the office. DD:  11/02/99 TD:  11/02/99 Job: [id number]

## 2010-06-22 NOTE — Discharge Summary (Signed)
[place]. [facility]  Patient:    Benjamin Cook, Benjamin Cook                      MRN: [id number] Adm. Date:  08/01/99 Disc. Date: 08/03/99 Attending:  Noralyn Pick. Eden Emms, M.D. [facility] Dictator:   Rozell Searing, P.A. CC:         Justine Null, M.D. [facility]                           Discharge Summary  PROCEDURES: Stent LAD on August 02, 1999.  REASON FOR ADMISSION: Benjamin Cook is a 65 year old male, without prior cardiac history, but with multiple cardiac risk factors including HTN, history of hypercholesterolemia and continued tobacco smoking, who upon routine follow up with Dr. Everardo All was found to have an abnormal EKG, worrisome for recent anterior septal myocardial infarction. The patient was asymptomatic denying any current or prior history of chest pain.  He was admitted directly to [facility] and seen by [place] in consultation.  Following presentation to the emergency room, the patient had an echocardiogram performed by Dr. Eden Emms revealing global hypokinesis/septal apical akinesis; EF 30%.  Medications were adjusted, including additional Lovenox and plan was to proceed with diagnostic coronary angiogram.  LABORATORY DATA:  Cardiac enzymes:  CPK/MB negative times two. Troponin I 0.04/0.5.  Lipid profile: Cholesterol 180, triglycerides 64, HDL 37, LDL 130, potassium 3.8 on admission - 3.2 at discharge. CBC:  Normal.  HOSPITAL COURSE:  Following admission, the patient ruled out for MI with negative serial cardiac enzymes.  Diagnostic coronary angiogram on August 02, 1999 by Dr. Burna Forts (see catheterization report for details) revealed 95% mid LAD with mild residual CFX/RCA disease. Left ventriculogram revealed significant LVD (EF 25-30%); anterior apical AK; no mitral regurgitation - LVEDP 25.  Dr. Eden Emms recommended PCI of the LAD with outpatient follow up thallium in six - eight weeks.  The patient subsequently underwent successful stenting of the 95% LAD followed by a  long 70% lesion with residual 0% stenosis and restoration of TIMI III flow.  No complications were noted and the patient was cleared for discharge the following morning.  At discharge, renal function within normal limits but mild hypokalemia (3.2) noted. Plan was to replete prior to discharge and add low dose potassium supplement to the discharge medication regimen.  Additionally, given the lipid profile revealing LDL of 130, patient was started on Baycol 0.4 mg q.d.  DISCHARGE MEDICATIONS: 1. Accupril 40 mg b.i.d. 2. Plavix 75 mg q.d. (four weeks). 3. Coated aspirin 325 mg q.d. 4. Lasix 20 mg q.d. 5. Lopressor 25 mg b.i.d. 6. Baycol 0.4 mg q.d. 7. K-Dur 10 mEq q.d. 8. Nitrol Stat as directed.  INSTRUCTIONS:  The patient is to refrain from heavy lifting/driving times two days.  Discontinue smoking, maintain low fat/cholesterol diet.  The patient will need a follow BMET in one week for monitoring of hypokalemia.  The patient is instructed to schedule a follow up appointment with Dr. Romero Belling in one - two weeks. He will follow up with Dr. Orlin [facility] on Friday, August 24, 1999 at 10:30 a.m.  DISCHARGE DIAGNOSES: 1. Ischemic cardiomyopathy.    a. Negative serial cardiac enzymes.    b. Successful stent 95% mid left anterior descending August 02, 1999.    c. Residual 70% distal right coronary artery.    d. Severe left ventricular dysfunction (ejection fraction 25 - 30%);  intra-apical akinesis. 2. Hypokalemia. 3. Hypertension. 4. Hyperlipidemia. 5. Tobacco. DD:  08/03/99 TD:  08/03/99 Job: [id number]

## 2010-06-22 NOTE — H&P (Signed)
[place]. [facility]  Patient:    Benjamin Cook, Benjamin Cook                    MRN: [id number] Adm. Date:  [id number] Attending:  Heber [place]                         History and Physical  REASON FOR ADMISSION:  Abnormal electrocardiogram.  HISTORY OF PRESENT ILLNESS:  The patient is a 65 year old man who came for his routine office visit on August 01, 1999 to follow up hypertension.  He has no history of heart disease.  He was found to have a grossly abnormal electrocardiogram and was thus admitted.  PAST MEDICAL HISTORY: 1. Adrenal mass, workup negative. 2. Hypertension. 3. Cigarette smoker. 4. Hyperglycemia.  FAMILY HISTORY:  Negative for heart disease at a young age.  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  REVIEW OF SYSTEMS:  The patient denies the following:  Fever, change in the weight, chest pain, excessive diaphoresis, shortness of breath, nausea, vomiting, bright red blood per rectum, hematuria, dysuria, decreased urinary force, skin rash and depression.  MEDICATIONS: 1. Accupril 40 mg a day. 2. Lasix 20 mg a day. 3. Cardizem-CD 240 mg a day.  PHYSICAL EXAMINATION:  VITAL SIGNS:  Blood pressure is 150/90, heart rate 76, weight 150.  GENERAL:  Appears healthy.  SKIN:  Not diaphoretic.  NODES:  There is no palpable lymphadenopathy.  HEENT:  Head is atraumatic.  Sclerae are nonicteric.  Pharynx is clear.  NECK:  Supple.  No goiter.  CHEST:  Clear to auscultation.  CARDIOVASCULAR:  No JVD.  No edema.  Regular rate and rhythm.  No murmur.  ABDOMEN:  Soft, nontender.  No hepatosplenomegaly.  No mass.  GENITALIA/RECTAL:  Not done at this time due to patient condition.  EXTREMITIES:  No obvious deformity of the joints.  Dorsalis pedis pulses are intact bilaterally.  NEUROLOGIC:  Alert, well oriented.  Moves all fours.  Gait is normal.  Electrocardiogram shows a recent anterior myocardial  infarction.  IMPRESSION:  Very worrisome electrocardiogram in an asymptomatic individual with risk factors.  PLAN: 1. Admit to [facility]. 2. Consult cardiology. 3. Increase Accupril. DD:  08/02/99 TD:  08/02/99 Job: [id number]

## 2010-06-22 NOTE — Cardiovascular Report (Signed)
[place]. [facility]  Patient:    Benjamin Cook, Benjamin Cook                    MRN: [id number] Proc. Date: 08/02/99 Adm. Date:  [id number] Disc. Date: [id number] Attending:  Heber [place] CC:         Veneda Melter, M.D. [facility]             Justine Null, M.D. [facility]             Peter C. Eden Emms, M.D. [facility]                        Cardiac Catheterization  PROCEDURES PERFORMED:  Percutaneous transluminal coronary angioplasty and stenting of the distal left anterior descending.  DIAGNOSES: 1. Two-vessel coronary artery disease. 2. Moderate left ventricular dysfunction. 3. History of anterior wall myocardial infarction.  INDICATIONS:  Benjamin Cook is a 65 year old black gentleman with multiple cardiac risk factors who presents with increasing dyspnea.  The patient was found to have an abnormal ECG with ST elevation in the inferolateral leads and ST depression in the inferior leads.  On echocardiogram he was found to have moderate LV dysfunction with global hypokinesis and septal and apical akinesis.  The patient was admitted to the hospital and presented for a left heart catheterization.  This showed high-grade narrowing of 95% in the mid LAD after the takeoff of the large second diagonal branch as well as moderate diffuse disease in the right coronary artery with moderate LV dysfunction. The patient is postulated to have suffered recent anterior wall myocardial infarction with residual symptoms and ECG changes.  He presents now for percutaneous intervention to the distal LAD.  TECHNIQUE:  Informed consent was obtained from the patient.  Existing 6 French sheath in the right femoral artery was exchanged over a wire for a 7 [place] sheath.  The patient was given heparin and ReoPro on a weight-adjusted basis to maintain ACT of greater than 250 seconds.  A 7 [place] Voda left 4 catheter was introduced; however, there was significant difficulty in engaging the  left coronary artery and a 7 [place] JL4 guide catheter was used to engage the left coronary artery.  A 0.014 inch trooper wire was initially used.  However, some difficulty was encountered in passing this through the stenosis and a luge wire was used to cross the stenosis.  A 2.5 x 20 mm Maverick balloon was introduced and used to predilate the lesion at 6 atmospheres for 64 seconds. Repeat angiography showed improvement in vessel lumen.  The proximal narrowing at the takeoff of the second diagonal branch was hard and difficult to open, and had significant residual recoil.  A 2.75 x 20 mm Maverick balloon was introduced and used to further dilate the lesion at 6 atmospheres for 60 seconds.  Repeat angiography showed persistence of a defect and the balloon was inflated at 12 atmospheres for 60 seconds.  Repeat angiography showed appropriate dilatation of the vessel to reference a segment distal to the stenosis.  However, there was evidence of a dissection and persistence of stenosis just after the second diagonal branch.  A 3.0 x 18 mm NIR Elite stent was introduced and carefully positioned under cineangiography in the distal LAD after at the second diagonal branch and deployed at 8 atmospheres for 60 seconds.  The stent delivery system was brought back slightly and used to further dilate the proximal segment of the stent in  the LAD at the takeoff of the second diagonal branch at 8 atmospheres for 60 seconds.  Repeat angiography showed an excellent result with no residual stenosis and appropriate positioning at the stent.  At the site of dissection there was TIMI-3 flow throughout the LAD and no evidence of distal vessel damage.  The guide catheter was then removed and sheath secured into position.  The patient was given 200 mg of Plavix orally.  He tolerated the procedure well and was transferred to the floor in stable condition.  FINAL RESULTS:  Successful PTCA and stenting of the distal  LAD with reduction of 95% narrowing to 0% with placement of a 3.0 x 18 mm NIR Elite stent. DD:  08/02/99 TD:  08/04/99 Job: [id number]

## 2010-06-22 NOTE — Cardiovascular Report (Signed)
[place]. [facility]  Patient:    Benjamin Cook, Benjamin Cook                    MRN: [id number] Proc. Date: 08/02/99 Adm. Date:  [id number] Attending:  Heber [place] CC:         Revonda Standard ________, M.D.                        Cardiac Catheterization  PROCEDURE PERFORMED:  Coronary arteriography.  INDICATIONS:  ECG showing anterior wall MI, question ischemic cardiomyopathy. Ejection fraction 30% by echocardiography.  CORONARY ARTERIOGRAPHY:  Left main coronary artery:  The left main coronary artery had a 20% discrete stenosis.  There was significant calcification in the proximal LAD.  The proximal LAD itself had 30% multiple discrete stenosis.  Left anterior descending:  The mid LAD had a 95% discrete lesion.  The distal LAD had 40% multiple discrete lesions.  The LAD was very large and wrapped the apex to the mid inferior wall.  The second diagonal branch had 40% tubular disease.  Circumflex coronary artery:  The circumflex coronary artery had a 40% tubular lesion at the takeoff of a large bifurcating OM branch.  Right coronary artery:  The right coronary artery was dominant.  There were 30-40% multiple discrete lesions proximally.  There was 70% diffuse disease distally.  The PDA and PLA had 30% multiple discrete lesions.  RIGHT ANTERIOR OBLIQUE VENTRICULOGRAPHY:  The RAO ventriculography showed and EF in the 25-30% range.  There was anterior apical and inferior apical wall akinesis.  There was no mural thrombus.  There was no MR.  Left ventricular end-diastolic pressure was 25 mmHg.  IMPRESSION:  The patients anterior wall myocardial infarction is not new. His troponins and CPKs were negative.  He was asymptomatic on visit to our outpatient office.  However, he has preserved R waves in leads V4, V5 and V6 and I think he would most likely benefit from angioplasty to the LAD both in terms of remodeling and long-term prognosis.  This will be done by  Dr. Chales Abrahams.  He will then have a thallium study in six to eight weeks to further access viability and ischemia. DD:  08/02/99 TD:  08/03/99 Job: [id number]

## 2010-07-30 ENCOUNTER — Encounter: Payer: BC Managed Care – PPO | Admitting: Cardiology

## 2010-07-30 ENCOUNTER — Other Ambulatory Visit: Payer: BC Managed Care – PPO | Admitting: *Deleted

## 2010-08-30 ENCOUNTER — Encounter: Payer: BC Managed Care – PPO | Admitting: Internal Medicine

## 2010-09-25 ENCOUNTER — Telehealth: Payer: Self-pay | Admitting: Internal Medicine

## 2010-09-25 NOTE — Telephone Encounter (Signed)
Pt needs a later appt on Friday.

## 2010-09-25 NOTE — Telephone Encounter (Signed)
Spoke with Maureen Ralphs and will move his appointment to 9/6 at 4;15  She will call the patient

## 2010-09-28 ENCOUNTER — Encounter: Payer: BC Managed Care – PPO | Admitting: Internal Medicine

## 2010-10-11 ENCOUNTER — Ambulatory Visit (INDEPENDENT_AMBULATORY_CARE_PROVIDER_SITE_OTHER): Payer: BC Managed Care – PPO | Admitting: Internal Medicine

## 2010-10-11 ENCOUNTER — Encounter: Payer: Self-pay | Admitting: Internal Medicine

## 2010-10-11 VITALS — BP 189/87 | HR 79 | Ht 71.0 in | Wt 141.0 lb

## 2010-10-11 DIAGNOSIS — I2589 Other forms of chronic ischemic heart disease: Secondary | ICD-10-CM

## 2010-10-11 DIAGNOSIS — I10 Essential (primary) hypertension: Secondary | ICD-10-CM

## 2010-10-11 DIAGNOSIS — I5022 Chronic systolic (congestive) heart failure: Secondary | ICD-10-CM

## 2010-10-11 DIAGNOSIS — F17200 Nicotine dependence, unspecified, uncomplicated: Secondary | ICD-10-CM

## 2010-10-11 NOTE — Assessment & Plan Note (Signed)
Stable No change required today  

## 2010-10-11 NOTE — Patient Instructions (Signed)
Your physician wants you to follow-up in: 6 months with Dr Jacquiline Doe will receive a reminder letter in the mail two months in advance. If you don't receive a letter, please call our office to schedule the follow-up appointment.   Remote monitoring is used to monitor your Pacemaker of ICD from home. This monitoring reduces the number of office visits required to check your device to one time per year. It allows [place] to keep an eye on the functioning of your device to ensure it is working properly. You are scheduled for a device check from home on 01/10/11. You may send your transmission at any time that day. If you have a wireless device, the transmission will be sent automatically. After your physician reviews your transmission, you will receive a postcard with your next transmission date.

## 2010-10-11 NOTE — Assessment & Plan Note (Signed)
He smokes cigars I have encouraged cessation.  He is not yet ready to quit.

## 2010-10-11 NOTE — Progress Notes (Signed)
The patient presents today for routine electrophysiology followup.  Since having his ICD implanted, the patient reports doing very well.  His ICD pocket has healed uneventfully.  Today, he denies symptoms of palpitations, chest pain, shortness of breath, orthopnea, PND, lower extremity edema, dizziness, presyncope, syncope, or neurologic sequela.  The patient feels that he is tolerating medications without difficulties and is otherwise without complaint today.   Past Medical History  Diagnosis Date  . CAD (coronary artery disease)   . Cardiomyopathy, ischemic   . CHF (congestive heart failure)     class II/III  . HTN (hypertension)   . Hyperlipidemia   . COPD (chronic obstructive pulmonary disease)     smokes cigars but has quit cigarettes  . PSA (psoriatic arthritis)     increased  . History of hematuria   . Adrenal mass     per pt this is remote (10 years) and benign by biopsy  . Anxiety   . PVD (peripheral vascular disease)   . Hx of colonoscopy    Past Surgical History  Procedure Date  . Coronary stent placement   . Cataract surgery   . Cardiac defibrillator placement 05/25/10    [employer] fortified ST DR ICD implanted by [facility] for primary prevention  of sudden death    Current Outpatient Prescriptions  Medication Sig Dispense Refill  . aspirin EC 325 MG EC tablet Take 325 mg by mouth daily.        . carvedilol (COREG) 25 MG tablet Take 1 tablet (25 mg total) by mouth 2 (two) times daily with a meal. Take 1 and and 1/2 tablet by mouth twice a day  90 tablet  4  . fish oil-omega-3 fatty acids 1000 MG capsule Take 1 g by mouth daily.        . furosemide (LASIX) 40 MG tablet Take 40 mg by mouth daily.        Marland Kitchen lisinopril (PRINIVIL,ZESTRIL) 20 MG tablet Take 20 mg by mouth daily.        . Multiple Vitamin (MULTIVITAMIN) capsule Take 1 capsule by mouth daily.        . nitroGLYCERIN (NITROSTAT) 0.4 MG SL tablet One tablet under tongue every 5 minutes as needed for chest  pain--may repeat 3 times       . oxycodone (OXY-IR) 5 MG capsule Take 5 mg by mouth every 6 (six) hours as needed.        . potassium chloride SA (K-DUR,KLOR-CON) 20 MEQ tablet Take 20 mEq by mouth daily.        . rosuvastatin (CRESTOR) 40 MG tablet Take 40 mg by mouth daily.          No Known Allergies  History   Social History  . Marital Status: Married    Spouse Name: N/A    Number of Children: N/A  . Years of Education: N/A   Occupational History  . Lawn care    Social History Main Topics  . Smoking status: Current Everyday Smoker    Types: Cigars  . Smokeless tobacco: Never Used   Comment: smokes cigars now, no longer smokes cigarettes but previously smoked 2ppd  . Alcohol Use: No  . Drug Use: No  . Sexually Active: Not on file   Other Topics Concern  . Not on file   Social History Narrative  . No narrative on file    Family History  Problem Relation Age of Onset  . Cancer Neg Hx   .  Cirrhosis Mother     due to ETOH    ROS-  All systems are reviewed and are negative except as outlined in the HPI above   Physical Exam: Filed Vitals:   10/11/10 1643  BP: 189/87  Pulse: 79  Height: 5\' 11"  (1.803 m)  Weight: 141 lb (63.957 kg)    GEN- The patient is well appearing, alert and oriented x 3 today.   Head- normocephalic, atraumatic Eyes-  Sclera clear, conjunctiva pink Ears- hearing intact Oropharynx- clear Neck- supple, no JVP Lymph- no cervical lymphadenopathy Lungs- Clear to ausculation bilaterally, normal work of breathing Chest- ICD pocket is well healed Heart- Regular rate and rhythm, no murmurs, rubs or gallops, PMI not laterally displaced GI- soft, NT, ND, + BS Extremities- no clubbing, cyanosis, or edema MS- no significant deformity or atrophy Skin- no rash or lesion Psych- euthymic mood, full affect Neuro- strength and sensation are intact  ICD interrogation- reviewed in detail today,  See PACEART report  Assessment and Plan:

## 2010-10-11 NOTE — Assessment & Plan Note (Signed)
Stable without volume overload on exam No changes today  Normal ICD function See Pace Art report No changes today

## 2010-10-11 NOTE — Assessment & Plan Note (Signed)
No ischemic symptoms No changes today 

## 2010-10-12 LAB — ICD DEVICE OBSERVATION
AL AMPLITUDE: 3.3 mv
AL IMPEDENCE ICD: 450 Ohm
BAMS-0001: 180 {beats}/min
CHARGE TIME: 8.3 s
HV IMPEDENCE: 68 Ohm
RV LEAD IMPEDENCE ICD: 425 Ohm
TOT-0007: 1
TOT-0008: 0
TOT-0009: 1
TOT-0010: 3
TZAT-0001SLOWVT: 1
TZAT-0012SLOWVT: 200 ms
TZAT-0013SLOWVT: 2
TZAT-0019SLOWVT: 7.5 V
TZAT-0020SLOWVT: 1 ms
TZON-0004SLOWVT: 20
TZON-0005SLOWVT: 6
TZST-0001SLOWVT: 4
TZST-0003SLOWVT: 25 J
TZST-0003SLOWVT: 36 J
VENTRICULAR PACING ICD: 0 pct
VF: 0

## 2010-10-30 LAB — URINALYSIS, ROUTINE W REFLEX MICROSCOPIC
Bilirubin Urine: NEGATIVE
Glucose, UA: NEGATIVE
Ketones, ur: NEGATIVE
Leukocytes, UA: NEGATIVE
Nitrite: NEGATIVE
Protein, ur: NEGATIVE
Specific Gravity, Urine: 1.014
Urobilinogen, UA: 0.2
pH: 5.5

## 2010-10-30 LAB — COMPREHENSIVE METABOLIC PANEL
ALT: 13
AST: 20
Albumin: 3.5
CO2: 25
Chloride: 104
Creatinine, Ser: 0.98
GFR calc Af Amer: >60
GFR calc non Af Amer: >60
Sodium: 139
Total Bilirubin: 1.1

## 2010-10-30 LAB — POCT I-STAT 3, ART BLOOD GAS (G3+)
Acid-Base Excess: 1
Acid-Base Excess: 2
Bicarbonate: 23.2
Bicarbonate: 26.3 — ABNORMAL HIGH
O2 Saturation: 60
O2 Saturation: 92
Operator id: 298401
Operator id: 298401
TCO2: 24
TCO2: 28
pCO2 arterial: 31.1 — ABNORMAL LOW
pCO2 arterial: 41.2
pH, Arterial: 7.414
pH, Arterial: 7.482 — ABNORMAL HIGH
pO2, Arterial: 31 — CL
pO2, Arterial: 58 — ABNORMAL LOW

## 2010-10-30 LAB — CBC
HCT: 42.5
HCT: 45.7
Hemoglobin: 15
Hemoglobin: 15.5
MCHC: 33.9
MCHC: 35.3
MCV: 90.5
Platelets: 234
Platelets: 271
RBC: 5.05
RDW: 15.1
RDW: 15.2
WBC: 8.8

## 2010-10-30 LAB — LIPID PANEL
Cholesterol: 240 — ABNORMAL HIGH
HDL: 31 — ABNORMAL LOW
LDL Cholesterol: 189 — ABNORMAL HIGH
Total CHOL/HDL Ratio: 7.7
Triglycerides: 102
VLDL: 20

## 2010-10-30 LAB — CARDIAC PANEL(CRET KIN+CKTOT+MB+TROPI)
CK, MB: 1.9
Relative Index: INVALID
Total CK: 76
Troponin I: 0.05

## 2010-10-30 LAB — BASIC METABOLIC PANEL WITH GFR
BUN: 12
CO2: 26
Calcium: 9.8
Chloride: 102
Creatinine, Ser: 0.95
GFR calc Af Amer: >60
Glucose, Bld: 103 — ABNORMAL HIGH

## 2010-10-30 LAB — APTT: aPTT: 31

## 2010-10-30 LAB — DIFFERENTIAL
Basophils Absolute: 0.1
Basophils Relative: 1
Eosinophils Absolute: 0.3
Eosinophils Relative: 3
Lymphocytes Relative: 19
Lymphs Abs: 1.6
Monocytes Absolute: 0.7
Monocytes Relative: 8
Neutro Abs: 6.2
Neutrophils Relative %: 70

## 2010-10-30 LAB — PROTIME-INR
INR: 1
Prothrombin Time: 13.2

## 2010-10-30 LAB — CK TOTAL AND CKMB (NOT AT ARMC)
CK, MB: 2.8
Relative Index: INVALID
Total CK: 93

## 2010-10-30 LAB — POCT CARDIAC MARKERS
CKMB, poc: 2.2
Myoglobin, poc: 69.3
Operator id: 285841
Troponin i, poc: <0.05

## 2010-10-30 LAB — URINE MICROSCOPIC-ADD ON

## 2010-10-30 LAB — BASIC METABOLIC PANEL
GFR calc non Af Amer: >60
Potassium: 4.3
Sodium: 137

## 2010-10-30 LAB — TSH: TSH: 1.213

## 2010-10-30 LAB — TROPONIN I: Troponin I: 0.04

## 2010-10-30 LAB — D-DIMER, QUANTITATIVE: D-Dimer, Quant: 1.59 — ABNORMAL HIGH

## 2010-10-30 LAB — B-NATRIURETIC PEPTIDE (CONVERTED LAB): Pro B Natriuretic peptide (BNP): 832 — ABNORMAL HIGH

## 2010-10-31 ENCOUNTER — Encounter: Payer: BC Managed Care – PPO | Admitting: Internal Medicine

## 2010-10-31 LAB — BASIC METABOLIC PANEL
CO2: 24
Calcium: 9.1
Calcium: 9.1
Chloride: 101
Creatinine, Ser: 1.23
GFR calc Af Amer: >60
GFR calc Af Amer: >60
GFR calc non Af Amer: 59 — ABNORMAL LOW
Glucose, Bld: 90
Potassium: 3.8
Sodium: 137
Sodium: 140

## 2010-10-31 LAB — CBC
Hemoglobin: 14.5
MCHC: 34
RBC: 4.69
WBC: 8.3

## 2010-11-08 ENCOUNTER — Other Ambulatory Visit: Payer: Self-pay | Admitting: *Deleted

## 2010-11-08 MED ORDER — FUROSEMIDE 40 MG PO TABS: 40.0000 mg | ORAL_TABLET | Freq: Every day | ORAL | Status: DC

## 2010-11-08 MED ORDER — CARVEDILOL 12.5 MG PO TABS: 18.7500 mg | ORAL_TABLET | Freq: Two times a day (BID) | ORAL | Status: DC

## 2010-11-08 MED ORDER — LISINOPRIL 20 MG PO TABS: 20.0000 mg | ORAL_TABLET | Freq: Every day | ORAL | Status: DC

## 2010-11-08 MED ORDER — POTASSIUM CHLORIDE CRYS ER 20 MEQ PO TBCR: 20.0000 meq | EXTENDED_RELEASE_TABLET | Freq: Every day | ORAL | Status: DC

## 2010-11-14 ENCOUNTER — Other Ambulatory Visit: Payer: Self-pay | Admitting: *Deleted

## 2010-11-14 MED ORDER — POTASSIUM CHLORIDE CRYS ER 20 MEQ PO TBCR: 20.0000 meq | EXTENDED_RELEASE_TABLET | Freq: Every day | ORAL | Status: DC

## 2010-11-14 MED ORDER — FUROSEMIDE 40 MG PO TABS: 40.0000 mg | ORAL_TABLET | Freq: Every day | ORAL | Status: DC

## 2010-12-03 ENCOUNTER — Other Ambulatory Visit: Payer: Self-pay | Admitting: *Deleted

## 2010-12-03 MED ORDER — CARVEDILOL 12.5 MG PO TABS: 18.7500 mg | ORAL_TABLET | Freq: Two times a day (BID) | ORAL | Status: DC

## 2010-12-03 MED ORDER — LISINOPRIL 20 MG PO TABS: 20.0000 mg | ORAL_TABLET | Freq: Every day | ORAL | Status: DC

## 2010-12-17 ENCOUNTER — Encounter: Payer: BC Managed Care – PPO | Admitting: *Deleted

## 2010-12-19 ENCOUNTER — Ambulatory Visit (INDEPENDENT_AMBULATORY_CARE_PROVIDER_SITE_OTHER): Payer: BC Managed Care – PPO | Admitting: *Deleted

## 2010-12-19 ENCOUNTER — Encounter: Payer: Self-pay | Admitting: Internal Medicine

## 2010-12-19 DIAGNOSIS — I428 Other cardiomyopathies: Secondary | ICD-10-CM

## 2010-12-19 DIAGNOSIS — I5022 Chronic systolic (congestive) heart failure: Secondary | ICD-10-CM

## 2010-12-19 LAB — ICD DEVICE OBSERVATION
AL IMPEDENCE ICD: 437.5 Ohm
ATRIAL PACING ICD: 0 pct
BAMS-0001: 180 {beats}/min
DEV-0020ICD: NEGATIVE
RV LEAD IMPEDENCE ICD: 412.5 Ohm
TOT-0007: 1
TOT-0008: 0
TZAT-0012SLOWVT: 200 ms
TZAT-0013SLOWVT: 2
TZAT-0018SLOWVT: NEGATIVE
TZAT-0019SLOWVT: 7.5 V
TZAT-0020SLOWVT: 1 ms
TZON-0004SLOWVT: 20
TZON-0005SLOWVT: 6
TZST-0001SLOWVT: 4
TZST-0003SLOWVT: 36 J
VENTRICULAR PACING ICD: 0 pct
VF: 0

## 2010-12-19 NOTE — Progress Notes (Signed)
ICD check by industry

## 2011-01-01 ENCOUNTER — Other Ambulatory Visit: Payer: Self-pay | Admitting: Cardiology

## 2011-01-01 NOTE — Telephone Encounter (Signed)
[facility] [phone]

## 2011-01-02 MED ORDER — ROSUVASTATIN CALCIUM 40 MG PO TABS: 40.0000 mg | ORAL_TABLET | Freq: Every day | ORAL | Status: DC

## 2011-01-10 ENCOUNTER — Encounter: Payer: BC Managed Care – PPO | Admitting: *Deleted

## 2011-01-14 ENCOUNTER — Encounter: Payer: BC Managed Care – PPO | Admitting: Internal Medicine

## 2011-01-15 ENCOUNTER — Telehealth: Payer: Self-pay | Admitting: Internal Medicine

## 2011-01-15 DIAGNOSIS — E7800 Pure hypercholesterolemia, unspecified: Secondary | ICD-10-CM

## 2011-01-15 MED ORDER — ATORVASTATIN CALCIUM 80 MG PO TABS: 80.0000 mg | ORAL_TABLET | Freq: Every day | ORAL | Status: DC

## 2011-01-15 NOTE — Telephone Encounter (Signed)
Spoke with pt wife and [place], med change made Deliah Goody

## 2011-01-15 NOTE — Telephone Encounter (Signed)
Will forward for dr crenshaw review [place] Droll  

## 2011-01-15 NOTE — Telephone Encounter (Signed)
[facility] call Dr. [name] wrote a Rx for crestor 40mg  and pt insurance doesn't cover it and they need to know if he can be switched to something else like atorvastatin, simvastatin,lovistatin, or prevastatin

## 2011-01-15 NOTE — Telephone Encounter (Signed)
Dc crestor; lipitor 80 mg daily; number 30; refill x 12; lipids and liver in six weeks Benjamin Cook

## 2011-02-19 ENCOUNTER — Other Ambulatory Visit (HOSPITAL_COMMUNITY): Payer: Self-pay | Admitting: Internal Medicine

## 2011-02-19 DIAGNOSIS — M549 Dorsalgia, unspecified: Secondary | ICD-10-CM

## 2011-03-05 ENCOUNTER — Other Ambulatory Visit: Payer: BC Managed Care – PPO | Admitting: *Deleted

## 2011-03-21 ENCOUNTER — Ambulatory Visit (INDEPENDENT_AMBULATORY_CARE_PROVIDER_SITE_OTHER): Payer: Medicare Other | Admitting: *Deleted

## 2011-03-21 DIAGNOSIS — I2589 Other forms of chronic ischemic heart disease: Secondary | ICD-10-CM

## 2011-03-25 ENCOUNTER — Encounter: Payer: Self-pay | Admitting: *Deleted

## 2011-03-26 ENCOUNTER — Encounter: Payer: Self-pay | Admitting: Internal Medicine

## 2011-03-26 LAB — REMOTE ICD DEVICE
AL AMPLITUDE: 3.1 mv
AL IMPEDENCE ICD: 430 Ohm
ATRIAL PACING ICD: 1 pct
BAMS-0001: 180 {beats}/min
DEV-0020ICD: NEGATIVE
DEVICE MODEL ICD: 819770
HV IMPEDENCE: 74 Ohm
RV LEAD AMPLITUDE: 12 mv
RV LEAD IMPEDENCE ICD: 410 Ohm
TZAT-0001SLOWVT: 1
TZAT-0004SLOWVT: 8
TZAT-0012SLOWVT: 200 ms
TZAT-0013SLOWVT: 2
TZAT-0018SLOWVT: NEGATIVE
TZAT-0019SLOWVT: 7.5 v
TZAT-0020SLOWVT: 1 ms
TZON-0003SLOWVT: 320 ms
TZON-0004SLOWVT: 20
TZON-0005SLOWVT: 6
TZON-0010SLOWVT: 40 ms
TZST-0001SLOWVT: 2
TZST-0001SLOWVT: 3
TZST-0001SLOWVT: 4
TZST-0001SLOWVT: 5
TZST-0003SLOWVT: 25 j
TZST-0003SLOWVT: 36 j
TZST-0003SLOWVT: 40 j
VENTRICULAR PACING ICD: 1 pct

## 2011-03-29 NOTE — Progress Notes (Signed)
ICD remote 

## 2011-04-12 ENCOUNTER — Encounter: Payer: Self-pay | Admitting: *Deleted

## 2011-05-20 ENCOUNTER — Other Ambulatory Visit: Payer: Self-pay | Admitting: Cardiology

## 2011-05-20 MED ORDER — CARVEDILOL 12.5 MG PO TABS: 18.7500 mg | ORAL_TABLET | Freq: Two times a day (BID) | ORAL | Status: DC

## 2011-05-20 MED ORDER — LISINOPRIL 20 MG PO TABS: 20.0000 mg | ORAL_TABLET | Freq: Every day | ORAL | Status: DC

## 2011-05-20 MED ORDER — POTASSIUM CHLORIDE CRYS ER 20 MEQ PO TBCR: 20.0000 meq | EXTENDED_RELEASE_TABLET | Freq: Every day | ORAL | Status: DC

## 2011-05-20 MED ORDER — ATORVASTATIN CALCIUM 80 MG PO TABS: 80.0000 mg | ORAL_TABLET | Freq: Every day | ORAL | Status: DC

## 2011-05-20 MED ORDER — FUROSEMIDE 40 MG PO TABS: 40.0000 mg | ORAL_TABLET | Freq: Every day | ORAL | Status: DC

## 2011-05-23 ENCOUNTER — Other Ambulatory Visit: Payer: Self-pay | Admitting: Internal Medicine

## 2011-05-23 MED ORDER — ATORVASTATIN CALCIUM 80 MG PO TABS: 80.0000 mg | ORAL_TABLET | Freq: Every day | ORAL | Status: DC

## 2011-05-23 NOTE — Telephone Encounter (Signed)
°  [facility] did not get this refill request please resend atorvastatin 80 mg

## 2011-06-03 ENCOUNTER — Encounter: Payer: Self-pay | Admitting: Internal Medicine

## 2011-06-03 ENCOUNTER — Ambulatory Visit (INDEPENDENT_AMBULATORY_CARE_PROVIDER_SITE_OTHER): Payer: Medicare Other | Admitting: Internal Medicine

## 2011-06-03 VITALS — BP 138/82 | HR 66 | Resp 18 | Ht 71.0 in | Wt 141.0 lb

## 2011-06-03 DIAGNOSIS — I5022 Chronic systolic (congestive) heart failure: Secondary | ICD-10-CM

## 2011-06-03 DIAGNOSIS — I2589 Other forms of chronic ischemic heart disease: Secondary | ICD-10-CM

## 2011-06-03 DIAGNOSIS — F17200 Nicotine dependence, unspecified, uncomplicated: Secondary | ICD-10-CM

## 2011-06-03 NOTE — Assessment & Plan Note (Signed)
Stable without volume overload on exam No changes today  Normal ICD function See Pace Art report No changes today  

## 2011-06-03 NOTE — Assessment & Plan Note (Signed)
He smokes cigars I have encouraged cessation.  He is not yet ready to quit. 

## 2011-06-03 NOTE — Progress Notes (Signed)
Primary Cardiologist:  Dr [name]  The patient presents today for routine electrophysiology followup.  Today, he denies symptoms of palpitations, chest pain, shortness of breath, orthopnea, PND, lower extremity edema, dizziness, presyncope, syncope, or neurologic sequela.  The patient feels that he is tolerating medications without difficulties and is otherwise without complaint today.   Past Medical History  Diagnosis Date  . CAD (coronary artery disease)   . Cardiomyopathy, ischemic   . CHF (congestive heart failure)     class II/III  . HTN (hypertension)   . Hyperlipidemia   . COPD (chronic obstructive pulmonary disease)     smokes cigars but has quit cigarettes  . PSA (psoriatic arthritis)     increased  . History of hematuria   . Adrenal mass     per pt this is remote (10 years) and benign by biopsy  . Anxiety   . PVD (peripheral vascular disease)   . Hx of colonoscopy    Past Surgical History  Procedure Date  . Coronary stent placement   . Cataract surgery   . Cardiac defibrillator placement 05/25/10    [employer] fortified ST DR ICD implanted by [facility] for primary prevention  of sudden death    Current Outpatient Prescriptions  Medication Sig Dispense Refill  . aspirin EC 325 MG EC tablet Take 325 mg by mouth daily.        Marland Kitchen atorvastatin (LIPITOR) 80 MG tablet Take 1 tablet (80 mg total) by mouth daily.  90 tablet  3  . carvedilol (COREG) 12.5 MG tablet Take 1.5 tablets (18.75 mg total) by mouth 2 (two) times daily with a meal.  270 tablet  4  . fish oil-omega-3 fatty acids 1000 MG capsule Take 1 g by mouth daily.        . furosemide (LASIX) 40 MG tablet Take 1 tablet (40 mg total) by mouth daily.  90 tablet  3  . lisinopril (PRINIVIL,ZESTRIL) 20 MG tablet Take 1 tablet (20 mg total) by mouth daily.  90 tablet  4  . Multiple Vitamin (MULTIVITAMIN) capsule Take 1 capsule by mouth daily.        . nitroGLYCERIN (NITROSTAT) 0.4 MG SL tablet One tablet under tongue every 5  minutes as needed for chest pain--may repeat 3 times       . oxycodone (OXY-IR) 5 MG capsule Take 5 mg by mouth every 6 (six) hours as needed.        . potassium chloride SA (K-DUR,KLOR-CON) 20 MEQ tablet Take 1 tablet (20 mEq total) by mouth daily.  90 tablet  4    No Known Allergies  History   Social History  . Marital Status: Married    Spouse Name: N/A    Number of Children: N/A  . Years of Education: N/A   Occupational History  . Lawn care    Social History Main Topics  . Smoking status: Current Everyday Smoker    Types: Cigars  . Smokeless tobacco: Never Used   Comment: smokes cigars now, no longer smokes cigarettes but previously smoked 2ppd  . Alcohol Use: No  . Drug Use: No  . Sexually Active: Not on file   Other Topics Concern  . Not on file   Social History Narrative  . No narrative on file    Family History  Problem Relation Age of Onset  . Cancer Neg Hx   . Cirrhosis Mother     due to ETOH   Physical Exam: Filed Vitals:  06/03/11 1210  BP: 138/82  Pulse: 66  Resp: 18  Height: 5\' 11"  (1.803 m)  Weight: 141 lb (63.957 kg)  SpO2: 94%    GEN- The patient is well appearing, alert and oriented x 3 today.   Head- normocephalic, atraumatic Eyes-  Sclera clear, conjunctiva pink Ears- hearing intact Oropharynx- clear Neck- supple, no JVP Lymph- no cervical lymphadenopathy Lungs- Clear to ausculation bilaterally, normal work of breathing Chest- ICD pocket is well healed Heart- Regular rate and rhythm, no murmurs, rubs or gallops, PMI not laterally displaced GI- soft, NT, ND, + BS Extremities- no clubbing, cyanosis, or edema  ekg today reveals sinus rhythm anterior infarctio, diffuse TW inversions  ICD interrogation- reviewed in detail today,  See PACEART report  Assessment and Plan:

## 2011-06-03 NOTE — Patient Instructions (Signed)
Your physician wants you to follow-up in: 6 months with device clinic and 12 months with Dr Allred You will receive a reminder letter in the mail two months in advance. If you don't receive a letter, please call our office to schedule the follow-up appointment.  

## 2011-06-11 NOTE — Progress Notes (Signed)
Addended by: Vista Mink D on: 06/11/2011 05:50 PM   Modules accepted: Orders

## 2011-06-12 LAB — ICD DEVICE OBSERVATION
AL AMPLITUDE: 1.5 mv
CHARGE TIME: 8.8 s
DEVICE MODEL ICD: 819770
FVT: 0
HV IMPEDENCE: 78 Ohm
MODE SWITCH EPISODES: 0
RV LEAD THRESHOLD: 0.75 V
TOT-0007: 1
TOT-0008: 0
TOT-0010: 5
TZAT-0001SLOWVT: 1
TZAT-0019SLOWVT: 7.5 V
TZAT-0020SLOWVT: 1 ms
TZON-0004SLOWVT: 20
TZON-0005SLOWVT: 6
TZON-0010SLOWVT: 40 ms
TZST-0001SLOWVT: 4
TZST-0003SLOWVT: 25 J
TZST-0003SLOWVT: 40 J
VF: 0

## 2011-09-16 ENCOUNTER — Ambulatory Visit (INDEPENDENT_AMBULATORY_CARE_PROVIDER_SITE_OTHER): Payer: Medicare Other | Admitting: *Deleted

## 2011-09-16 ENCOUNTER — Encounter: Payer: Self-pay | Admitting: Internal Medicine

## 2011-09-16 DIAGNOSIS — I5022 Chronic systolic (congestive) heart failure: Secondary | ICD-10-CM

## 2011-09-16 DIAGNOSIS — I2589 Other forms of chronic ischemic heart disease: Secondary | ICD-10-CM

## 2011-09-16 LAB — ICD DEVICE OBSERVATION
AL AMPLITUDE: 1.8 mv
AL THRESHOLD: 0.5 V
BAMS-0001: 180 {beats}/min
DEVICE MODEL ICD: 819770
MODE SWITCH EPISODES: 0
RV LEAD THRESHOLD: 0.75 V
TOT-0006: 20120420000000
TOT-0007: 1
TZAT-0001SLOWVT: 1
TZAT-0004SLOWVT: 8
TZAT-0012SLOWVT: 200 ms
TZAT-0019SLOWVT: 7.5 V
TZON-0004SLOWVT: 20
TZST-0001SLOWVT: 3
TZST-0001SLOWVT: 5
TZST-0003SLOWVT: 25 J
TZST-0003SLOWVT: 40 J
VENTRICULAR PACING ICD: 0.01 pct
VF: 0

## 2011-09-16 NOTE — Progress Notes (Signed)
ICD check by industry for research 

## 2011-09-17 ENCOUNTER — Telehealth: Payer: Self-pay | Admitting: Cardiology

## 2011-09-17 NOTE — Telephone Encounter (Signed)
New Problem:     I called the patient and was unable to reach them. I left a message on their voicemail with my name, the reason I called, the name of his physician, and a number to call back to schedule their appointment. 

## 2011-12-17 ENCOUNTER — Encounter: Payer: Self-pay | Admitting: *Deleted

## 2011-12-26 ENCOUNTER — Encounter: Payer: Medicare Other | Admitting: Internal Medicine

## 2012-01-01 ENCOUNTER — Encounter: Payer: Self-pay | Admitting: Internal Medicine

## 2012-02-13 ENCOUNTER — Encounter: Payer: Self-pay | Admitting: *Deleted

## 2012-03-18 ENCOUNTER — Encounter: Payer: Medicare Other | Admitting: Internal Medicine

## 2012-03-23 ENCOUNTER — Ambulatory Visit (INDEPENDENT_AMBULATORY_CARE_PROVIDER_SITE_OTHER): Payer: Medicare Other | Admitting: *Deleted

## 2012-03-23 ENCOUNTER — Encounter (INDEPENDENT_AMBULATORY_CARE_PROVIDER_SITE_OTHER): Payer: Medicare Other

## 2012-03-23 DIAGNOSIS — R0989 Other specified symptoms and signs involving the circulatory and respiratory systems: Secondary | ICD-10-CM

## 2012-03-23 DIAGNOSIS — I428 Other cardiomyopathies: Secondary | ICD-10-CM

## 2012-04-01 ENCOUNTER — Encounter: Payer: Self-pay | Admitting: Internal Medicine

## 2012-04-01 ENCOUNTER — Other Ambulatory Visit: Payer: Self-pay | Admitting: Internal Medicine

## 2012-04-08 ENCOUNTER — Ambulatory Visit (INDEPENDENT_AMBULATORY_CARE_PROVIDER_SITE_OTHER): Payer: Medicare Other | Admitting: Physician Assistant

## 2012-04-08 ENCOUNTER — Telehealth: Payer: Self-pay | Admitting: Cardiology

## 2012-04-08 ENCOUNTER — Encounter: Payer: Self-pay | Admitting: Physician Assistant

## 2012-04-08 VITALS — BP 162/80 | HR 50 | Ht 71.0 in | Wt 144.2 lb

## 2012-04-08 DIAGNOSIS — I2589 Other forms of chronic ischemic heart disease: Secondary | ICD-10-CM

## 2012-04-08 DIAGNOSIS — F17200 Nicotine dependence, unspecified, uncomplicated: Secondary | ICD-10-CM

## 2012-04-08 DIAGNOSIS — Z01818 Encounter for other preprocedural examination: Secondary | ICD-10-CM

## 2012-04-08 DIAGNOSIS — I10 Essential (primary) hypertension: Secondary | ICD-10-CM

## 2012-04-08 DIAGNOSIS — Z79899 Other long term (current) drug therapy: Secondary | ICD-10-CM

## 2012-04-08 DIAGNOSIS — Z4502 Encounter for adjustment and management of automatic implantable cardiac defibrillator: Secondary | ICD-10-CM

## 2012-04-08 DIAGNOSIS — I714 Abdominal aortic aneurysm, without rupture: Secondary | ICD-10-CM

## 2012-04-08 DIAGNOSIS — E785 Hyperlipidemia, unspecified: Secondary | ICD-10-CM

## 2012-04-08 DIAGNOSIS — I2510 Atherosclerotic heart disease of native coronary artery without angina pectoris: Secondary | ICD-10-CM

## 2012-04-08 MED ORDER — ASPIRIN 81 MG PO TBEC: 81.0000 mg | DELAYED_RELEASE_TABLET | Freq: Every day | ORAL | Status: DC

## 2012-04-08 MED ORDER — CARVEDILOL 25 MG PO TABS: 25.0000 mg | ORAL_TABLET | Freq: Two times a day (BID) | ORAL | Status: DC

## 2012-04-08 NOTE — Assessment & Plan Note (Signed)
Patient's blood pressure is elevated today.He does he asked for salt. We have instructed him on a 2 g sodium diet. Also increased his carvedilol to 25 mg b.i.d.

## 2012-04-08 NOTE — Assessment & Plan Note (Signed)
ICD is checked regularly. Follow up with Dr. Johney Frame in April for his yearly checkup.

## 2012-04-08 NOTE — Assessment & Plan Note (Signed)
Status post PCI of the LAD in 2001. Last catheter in 2009 showed nonobstructive disease. Patient denies chest pain.

## 2012-04-08 NOTE — Assessment & Plan Note (Signed)
Patient's abdominal aortic aneurysm was 3.7 x 3.7 cm and 2011. We'll recheck an ultrasound prior to cataract surgery.

## 2012-04-08 NOTE — Telephone Encounter (Signed)
New Problem:    Patient's wife called in wanting to let Benjamin Cook know that his cataract surgeon's name is Dr. Vonna Kotyk of [facility] and surgical laser center.  Please call back if you have any questions.

## 2012-04-08 NOTE — Assessment & Plan Note (Addendum)
Patient is here for preoperative clearance before undergoing cataract surgery April 20, 2012. Vision has an ischemic cardiomyopathy ejection fraction approximately 30%, ICD, coronary artery disease status post PCI to the LAD in 2001. He also has abdominal aortic aneurysm that was 3.7 x 3.7 cm and 2011 and has failed to had this rechecked. Patient is asymptomatic and overall doing well. I discussed this patient detail with Dr. Berton Mount. We will check an abdominal aortic ultrasound and no masses aneurysm has increased greatly the patient can proceed with cataract surgery. The patient's blood pressure is elevated today and we will adjust his medications.Dr. Graciela Husbands also recommends decreasing his aspirin to 81 mg daily.

## 2012-04-08 NOTE — Patient Instructions (Addendum)
Your physician recommends that you schedule a follow-up appointment in: April  DUE TO SEE DR [facility] Your physician has recommended you make the following change in your medication:  DECREASE ASPIRIN TO 81 MG EVERY DAY  AND INCREASE CARVEDILOL TO 25 MG TWICE DAILY   Your physician recommends that you return for lab work in:  SAME DAY AS  ULTRASOUND  BMET LIPID LIVER   Your physician has requested that you have an abdominal aorta duplex. During this test, an ultrasound is used to evaluate the aorta. Allow 30 minutes for this exam. Do not eat after midnight the day before and avoid carbonated beverages AS SOON AS POSSIBLE  Your physician discussed the hazards of tobacco use. Tobacco use cessation is recommended and techniques and options to help you quit were discussed.    2 Gram Low Sodium Diet A 2 gram sodium diet restricts the amount of sodium in the diet to no more than 2 g or 2000 mg daily. Limiting the amount of sodium is often used to help lower blood pressure. It is important if you have heart, liver, or kidney problems. Many foods contain sodium for flavor and sometimes as a preservative. When the amount of sodium in a diet needs to be low, it is important to know what to look for when choosing foods and drinks. The following includes some information and guidelines to help make it easier for you to adapt to a low sodium diet. QUICK TIPS  Do not add salt to food.  Avoid convenience items and fast food.  Choose unsalted snack foods.  Buy lower sodium products, often labeled as "lower sodium" or "no salt added."  Check food labels to learn how much sodium is in 1 serving.  When eating at a restaurant, ask that your food be prepared with less salt or none, if possible. READING FOOD LABELS FOR SODIUM INFORMATION The nutrition facts label is a good place to find how much sodium is in foods. Look for products with no more than 500 to 600 mg of sodium per meal and no more than 150 mg per  serving. Remember that 2 g = 2000 mg. The food label may also list foods as:  Sodium-free: Less than 5 mg in a serving.  Very low sodium: 35 mg or less in a serving.  Low-sodium: 140 mg or less in a serving.  Light in sodium: 50% less sodium in a serving. For example, if a food that usually has 300 mg of sodium is changed to become light in sodium, it will have 150 mg of sodium.  Reduced sodium: 25% less sodium in a serving. For example, if a food that usually has 400 mg of sodium is changed to reduced sodium, it will have 300 mg of sodium. CHOOSING FOODS Grains  Avoid: Salted crackers and snack items. Some cereals, including instant hot cereals. Bread stuffing and biscuit mixes. Seasoned rice or pasta mixes.  Choose: Unsalted snack items. Low-sodium cereals, oats, puffed wheat and rice, shredded wheat. English muffins and bread. Pasta. Meats  Avoid: Salted, canned, smoked, spiced, pickled meats, including fish and poultry. Bacon, ham, sausage, cold cuts, hot dogs, anchovies.  Choose: Low-sodium canned tuna and salmon. Fresh or frozen meat, poultry, and fish. Dairy  Avoid: Processed cheese and spreads. Cottage cheese. Buttermilk and condensed milk. Regular cheese.  Choose: Milk. Low-sodium cottage cheese. Yogurt. Sour cream. Low-sodium cheese. Fruits and Vegetables  Avoid: Regular canned vegetables. Regular canned tomato sauce and paste. Frozen vegetables in  sauces. Olives. Rosita Fire. Relishes. Sauerkraut.  Choose: Low-sodium canned vegetables. Low-sodium tomato sauce and paste. Frozen or fresh vegetables. Fresh and frozen fruit. Condiments  Avoid: Canned and packaged gravies. Worcestershire sauce. Tartar sauce. Barbecue sauce. Soy sauce. Steak sauce. Ketchup. Onion, garlic, and table salt. Meat flavorings and tenderizers.  Choose: Fresh and dried herbs and spices. Low-sodium varieties of mustard and ketchup. Lemon juice. Tabasco sauce. Horseradish. SAMPLE 2 GRAM SODIUM MEAL  PLAN Breakfast / Sodium (mg)  1 cup low-fat milk / 143 mg  2 slices whole-wheat toast / 270 mg  1 tbs heart-healthy margarine / 153 mg  1 hard-boiled egg / 139 mg  1 small orange / 0 mg Lunch / Sodium (mg)  1 cup raw carrots / 76 mg   cup hummus / 298 mg  1 cup low-fat milk / 143 mg   cup red grapes / 2 mg  1 whole-wheat pita bread / 356 mg Dinner / Sodium (mg)  1 cup whole-wheat pasta / 2 mg  1 cup low-sodium tomato sauce / 73 mg  3 oz lean ground beef / 57 mg  1 small side salad (1 cup raw spinach leaves,  cup cucumber,  cup yellow bell pepper) with 1 tsp olive oil and 1 tsp red wine vinegar / 25 mg Snack / Sodium (mg)  1 container low-fat vanilla yogurt / 107 mg  3 graham cracker squares / 127 mg Nutrient Analysis  Calories: 2033  Protein: 77 g  Carbohydrate: 282 g  Fat: 72 g  Sodium: 1971 mg Document Released: 01/21/2005 Document Revised: 04/15/2011 Document Reviewed: 04/24/2009 [facility] Patient Information 2013 [place], [place].

## 2012-04-08 NOTE — Assessment & Plan Note (Signed)
Patient continues to smoke 4 cigarettes daily. Have advised smoking cessation.

## 2012-04-08 NOTE — Progress Notes (Signed)
HPI:  Benjamin Cook is a pleasant 67 year old gentleman  with a past medical history of coronary disease, ischemic cardiomyopathy, hypertension and hyperlipidemia and ICD. Cardiac history dates back to June of 2001. At that time the patient was found to have an abnormal electrocardiogram. Catheterization revealed a 95% mid LAD and a 70% diffuse right coronary artery. His ejection fraction was 25-30%. He had PCI of his LAD at that time. Last cardiac catheterization was performed in April of 2009. At that time he was found to have nonobstructive disease with the most significant being a 60-70% mid right coronary artery. He also had an ejection fraction of 15% with moderate mitral regurgitation. Last echocardiogram was performed in Sept 2010 and showed an ejection fraction of 30%. Abdominal ultrasound in Oct 2011 showed a 3.7 x 3.7 cm aneurysm and dilated iliacs. Followup recommended in six months. I last saw him in Oct of 2011. Patient had ICD placed 4/12.     Patient comes in today for preoperative cardiac clearance before undergoing cataract surgery on March 17. He denies any chest pain, palpitations, dyspnea, dyspnea on exertion, dizziness, or presyncope. He does not exercise regularly but he does cut wood, shovel snow, rake leaves and do all types of outdoor work without symptoms. His blood pressure is up today and he says he hasn't checked his blood pressure in 3 or 4 months. He had been checking it regularly and it was usually normal at 140/80. His wife states he does use a salt shaker a lot after she prepares his meals. He also continues to smokes 4 cigars daily. He was supposed to have an abdominal ultrasound a year ago but never had it done.   No Known Allergies  Current Outpatient Prescriptions on File Prior to Visit: aspirin EC 325 MG EC tablet, Take 325 mg by mouth daily.  , Disp: , Rfl:  atorvastatin (LIPITOR) 80 MG tablet, Take 1 tablet (80 mg total) by mouth daily., Disp: 90 tablet, Rfl:  3 carvedilol (COREG) 12.5 MG tablet, Take 1.5 tablets (18.75 mg total) by mouth 2 (two) times daily with a meal., Disp: 270 tablet, Rfl: 4 fish oil-omega-3 fatty acids 1000 MG capsule, Take 1 g by mouth daily.  , Disp: , Rfl:  furosemide (LASIX) 40 MG tablet, Take 1 tablet (40 mg total) by mouth daily., Disp: 90 tablet, Rfl: 3 lisinopril (PRINIVIL,ZESTRIL) 20 MG tablet, Take 1 tablet (20 mg total) by mouth daily., Disp: 90 tablet, Rfl: 4 Multiple Vitamin (MULTIVITAMIN) capsule, Take 1 capsule by mouth daily.  , Disp: , Rfl:  nitroGLYCERIN (NITROSTAT) 0.4 MG SL tablet, One tablet under tongue every 5 minutes as needed for chest pain--may repeat 3 times , Disp: , Rfl:  oxycodone (OXY-IR) 5 MG capsule, Take 5 mg by mouth every 6 (six) hours as needed.  , Disp: , Rfl:  potassium chloride SA (K-DUR,KLOR-CON) 20 MEQ tablet, Take 1 tablet (20 mEq total) by mouth daily., Disp: 90 tablet, Rfl: 4  No current facility-administered medications on file prior to visit.   Past Medical History:   CAD (coronary artery disease)                                Cardiomyopathy, ischemic                                     CHF (  congestive heart failure)                                 Comment:class II/III   HTN (hypertension)                                           Hyperlipidemia                                               COPD (chronic obstructive pulmonary disease)                   Comment:smokes cigars but has quit cigarettes   PSA (psoriatic arthritis)                                      Comment:increased   History of hematuria                                         Adrenal mass                                                   Comment:per pt this is remote (10 years) and benign by               biopsy   Anxiety                                                      PVD (peripheral vascular disease)                            Hx of colonoscopy                                           Past Surgical  History:   CORONARY STENT PLACEMENT                                      cataract surgery                                              CARDIAC DEFIBRILLATOR PLACEMENT                  05/25/10        Comment:[employer]. Jude [employer] fortified ST DR ICD implanted               by  [facility] for primary prevention  of sudden death  Review of patient's family history indicates:   Cancer                         Neg Hx                   Cirrhosis                      Mother                     Comment: due to ETOH   Social History   Marital Status: Married             Spouse Name:                      Years of Education:                 Number of children:             Occupational History Occupation          Press photographer care                                 Social History Main Topics   Smoking Status: Current Every Day Smoker        Packs/Day: 0.00  Years:           Types: Cigars   Smokeless Status: Never Used                       Comment: smokes cigars now, no longer smokes             cigarettes but previously smoked 2ppd   Alcohol Use: No             Drug Use: No             Sexual Activity: Not on file        Other Topics            Concern   None on file  Social History Narrative   None on file    ROS: See HPI Eyes: Cataract-needs surgery Ears:Negative for hearing loss, tinnitus Cardiovascular: Negative for chest pain, palpitations,irregular heartbeat, dyspnea, dyspnea on exertion, near-syncope, orthopnea, paroxysmal nocturnal dyspnea and syncope,edema, claudication, cyanosis,.  Respiratory:   Negative for cough, hemoptysis, shortness of breath, sleep disturbances due to breathing, sputum production and wheezing.   Endocrine: Negative for cold intolerance and heat intolerance.  Hematologic/Lymphatic: Negative for adenopathy and bleeding problem. Does not bruise/bleed easily.  Musculoskeletal: Negative.   Gastrointestinal: Negative for nausea, vomiting,  reflux, abdominal pain, diarrhea, constipation.   Neurological: Negative.  Allergic/Immunologic: Negative for environmental allergies.    PHYSICAL EXAM: Well-nournished, in no acute distress. Neck: No JVD, HJR, Bruit, or thyroid enlargement  Lungs: Decreased breath sounds throughout,No tachypnea, clear without wheezing, rales, or rhonchi  Cardiovascular: RRR, PMI not displaced, heart sounds normal, no murmurs, gallops, bruit, thrill, or heave.  Abdomen: BS normal. Soft without organomegaly, masses, lesions or tenderness.  Extremities: without cyanosis, clubbing or edema. Good distal pulses bilateral  SKin: Warm, no lesions or rashes   Musculoskeletal: No  deformities  Neuro: no focal signs  BP 162/80  Pulse 50  Ht 5\' 11"  (1.803 m)  Wt 144 lb 3.2 oz (65.409 kg)  BMI 20.12 kg/m2    WUJ:WJXBJ bradycardia at 50 beats per minute left anterior fascicular block, LVH, inferior Q waves.

## 2012-04-08 NOTE — Assessment & Plan Note (Signed)
Stable without chest pain or heart failure. 

## 2012-04-08 NOTE — Assessment & Plan Note (Signed)
Patient is over due for a fasting lipid panel. We will order.

## 2012-04-08 NOTE — Addendum Note (Signed)
Addended by: Scherrie Bateman E on: 04/08/2012 09:56 AM   Modules accepted: Orders

## 2012-04-10 ENCOUNTER — Other Ambulatory Visit: Payer: Medicare Other

## 2012-04-13 ENCOUNTER — Telehealth: Payer: Self-pay | Admitting: Internal Medicine

## 2012-04-13 NOTE — Telephone Encounter (Signed)
Michelle from Dr. Vonna Kotyk office was calling to follow up on surgical clearance note since surgery is Monday and they need letter by Wednesday please advise

## 2012-04-14 NOTE — Telephone Encounter (Signed)
Spoke with office and let them know that patient needed to have abdominal ultrasound prior to clearance being given.  This was to be done on 04/10/12 but was rescheduled due to wether.  It is now scheduled for 04/15/12

## 2012-04-15 ENCOUNTER — Encounter (INDEPENDENT_AMBULATORY_CARE_PROVIDER_SITE_OTHER): Payer: Medicare Other

## 2012-04-15 ENCOUNTER — Other Ambulatory Visit (INDEPENDENT_AMBULATORY_CARE_PROVIDER_SITE_OTHER): Payer: Medicare Other

## 2012-04-15 DIAGNOSIS — E785 Hyperlipidemia, unspecified: Secondary | ICD-10-CM

## 2012-04-15 DIAGNOSIS — I10 Essential (primary) hypertension: Secondary | ICD-10-CM

## 2012-04-15 DIAGNOSIS — I714 Abdominal aortic aneurysm, without rupture: Secondary | ICD-10-CM

## 2012-04-15 DIAGNOSIS — Z79899 Other long term (current) drug therapy: Secondary | ICD-10-CM

## 2012-04-15 LAB — HEPATIC FUNCTION PANEL
ALT: 14 U/L (ref 0–53)
AST: 23 U/L (ref 0–37)
Albumin: 3.9 g/dL (ref 3.5–5.2)

## 2012-04-15 LAB — LIPID PANEL
HDL: 40.8 mg/dL (ref >39.00)
Total CHOL/HDL Ratio: 3
Triglycerides: 46 mg/dL (ref 0.0–149.0)

## 2012-04-15 LAB — BASIC METABOLIC PANEL
BUN: 11 mg/dL (ref 6–23)
Calcium: 9.1 mg/dL (ref 8.4–10.5)
Chloride: 107 mEq/L (ref 96–112)
Creatinine, Ser: 0.8 mg/dL (ref 0.4–1.5)
GFR: 118.26 mL/min (ref >60.00)
Glucose, Bld: 115 mg/dL — ABNORMAL HIGH (ref 70–99)

## 2012-04-17 ENCOUNTER — Other Ambulatory Visit: Payer: Medicare Other

## 2012-04-17 NOTE — Telephone Encounter (Addendum)
Ultrasound stable, will repeat in 6 months.   Dr Johney Frame says ok to proceed with cataract surgery.

## 2012-05-12 ENCOUNTER — Telehealth: Payer: Self-pay | Admitting: Endocrinology

## 2012-05-12 NOTE — Telephone Encounter (Signed)
Pt is due CPE. Pt has previously been advised of this but I called again today and left message w/ his wife asking that he call to sch an appt. Currently awaiting call back from the patient to get a CPE set up / Sherri S.

## 2012-05-13 ENCOUNTER — Encounter: Payer: Medicare Other | Admitting: Internal Medicine

## 2012-05-15 NOTE — Progress Notes (Signed)
defib check in clinic for research. 

## 2012-06-15 ENCOUNTER — Other Ambulatory Visit: Payer: Self-pay | Admitting: Cardiology

## 2012-06-22 ENCOUNTER — Ambulatory Visit (INDEPENDENT_AMBULATORY_CARE_PROVIDER_SITE_OTHER): Payer: Medicare Other | Admitting: *Deleted

## 2012-06-22 DIAGNOSIS — Z4502 Encounter for adjustment and management of automatic implantable cardiac defibrillator: Secondary | ICD-10-CM

## 2012-06-22 DIAGNOSIS — I2589 Other forms of chronic ischemic heart disease: Secondary | ICD-10-CM

## 2012-06-22 LAB — REMOTE ICD DEVICE
AL IMPEDENCE ICD: 400 Ohm
ATRIAL PACING ICD: 1 pct
HV IMPEDENCE: 72 Ohm
RV LEAD IMPEDENCE ICD: 380 Ohm
TZAT-0001SLOWVT: 1
TZAT-0013SLOWVT: 2
TZAT-0020SLOWVT: 1 ms
TZON-0005SLOWVT: 6
TZST-0001SLOWVT: 2
TZST-0001SLOWVT: 4
TZST-0003SLOWVT: 25 J
TZST-0003SLOWVT: 36 J
VENTRICULAR PACING ICD: 1 pct

## 2012-06-29 LAB — ICD DEVICE OBSERVATION
BAMS-0001: 180 {beats}/min
DEV-0020ICD: NEGATIVE
TZAT-0001SLOWVT: 1
TZAT-0012SLOWVT: 200 ms
TZAT-0020SLOWVT: 1 ms
TZON-0005SLOWVT: 6
TZON-0010SLOWVT: 40 ms
TZST-0001SLOWVT: 2
TZST-0001SLOWVT: 4
TZST-0003SLOWVT: 25 J

## 2012-07-13 ENCOUNTER — Other Ambulatory Visit: Payer: Self-pay | Admitting: Cardiology

## 2012-07-15 ENCOUNTER — Encounter: Payer: Self-pay | Admitting: *Deleted

## 2012-07-30 ENCOUNTER — Encounter: Payer: Self-pay | Admitting: Internal Medicine

## 2012-07-31 ENCOUNTER — Other Ambulatory Visit: Payer: Self-pay | Admitting: Cardiology

## 2012-09-03 ENCOUNTER — Ambulatory Visit (INDEPENDENT_AMBULATORY_CARE_PROVIDER_SITE_OTHER): Payer: Medicare Other | Admitting: Internal Medicine

## 2012-09-03 ENCOUNTER — Encounter: Payer: Self-pay | Admitting: Internal Medicine

## 2012-09-03 ENCOUNTER — Encounter: Payer: Self-pay | Admitting: Cardiology

## 2012-09-03 ENCOUNTER — Encounter (INDEPENDENT_AMBULATORY_CARE_PROVIDER_SITE_OTHER): Payer: Medicare Other

## 2012-09-03 VITALS — BP 140/80 | HR 54 | Ht 71.0 in | Wt 137.0 lb

## 2012-09-03 DIAGNOSIS — F17200 Nicotine dependence, unspecified, uncomplicated: Secondary | ICD-10-CM

## 2012-09-03 DIAGNOSIS — I2510 Atherosclerotic heart disease of native coronary artery without angina pectoris: Secondary | ICD-10-CM

## 2012-09-03 DIAGNOSIS — I5022 Chronic systolic (congestive) heart failure: Secondary | ICD-10-CM

## 2012-09-03 DIAGNOSIS — R0989 Other specified symptoms and signs involving the circulatory and respiratory systems: Secondary | ICD-10-CM

## 2012-09-03 DIAGNOSIS — I2589 Other forms of chronic ischemic heart disease: Secondary | ICD-10-CM

## 2012-09-03 LAB — ICD DEVICE OBSERVATION
AL IMPEDENCE ICD: 430 Ohm
BAMS-0001: 180 {beats}/min
CHARGE TIME: 9 s
HV IMPEDENCE: 72 Ohm
RV LEAD AMPLITUDE: 11.8 mv
RV LEAD IMPEDENCE ICD: 380 Ohm
TZAT-0012SLOWVT: 200 ms
TZAT-0013SLOWVT: 2
TZAT-0018SLOWVT: NEGATIVE
TZAT-0020SLOWVT: 1 ms
TZON-0003SLOWVT: 320 ms
TZON-0004SLOWVT: 20
TZON-0005SLOWVT: 6
TZST-0001SLOWVT: 4
TZST-0001SLOWVT: 5
TZST-0003SLOWVT: 36 J

## 2012-09-03 NOTE — Patient Instructions (Addendum)
Your physician recommends that you schedule a follow-up appointment in: Dr [name] in 3 months and Dr Johney Frame in 12 months

## 2012-09-03 NOTE — Progress Notes (Addendum)
Primary Cardiologist:  Dr [name]  The patient presents today for routine electrophysiology followup.  Today, he denies symptoms of palpitations, chest pain, shortness of breath, orthopnea, PND, lower extremity edema, dizziness, presyncope, syncope, or neurologic sequela.  The patient feels that he is tolerating medications without difficulties and is otherwise without complaint today.   Past Medical History  Diagnosis Date  . CAD (coronary artery disease)   . Cardiomyopathy, ischemic   . CHF (congestive heart failure)     class II/III  . HTN (hypertension)   . Hyperlipidemia   . COPD (chronic obstructive pulmonary disease)     smokes cigars but has quit cigarettes  . PSA (psoriatic arthritis)     increased  . History of hematuria   . Adrenal mass     per pt this is remote (10 years) and benign by biopsy  . Anxiety   . PVD (peripheral vascular disease)   . Hx of colonoscopy    Past Surgical History  Procedure Laterality Date  . Coronary stent placement    . Cataract surgery    . Cardiac defibrillator placement  05/25/10    [employer] fortified ST DR ICD implanted by [facility] for primary prevention  of sudden death    Current Outpatient Prescriptions  Medication Sig Dispense Refill  . atorvastatin (LIPITOR) 80 MG tablet TAKE 1 TABLET (80 MG TOTAL) BY MOUTH DAILY.  90 tablet  3  . atorvastatin (LIPITOR) 80 MG tablet TAKE 1 TABLET (80 MG TOTAL) BY MOUTH DAILY.  90 tablet  3  . carvedilol (COREG) 25 MG tablet Take 1 tablet (25 mg total) by mouth 2 (two) times daily with a meal.  180 tablet  4  . Fish Oil OIL Take 360 mg by mouth daily.      . furosemide (LASIX) 40 MG tablet TAKE 1 TABLET (40 MG TOTAL) BY MOUTH DAILY.  90 tablet  2  . lisinopril (PRINIVIL,ZESTRIL) 20 MG tablet TAKE 1 TABLET (20 MG TOTAL) BY MOUTH DAILY.  90 tablet  2  . Multiple Vitamin (MULTIVITAMIN) capsule Take 1 capsule by mouth daily.        . nitroGLYCERIN (NITROSTAT) 0.4 MG SL tablet One tablet under tongue  every 5 minutes as needed for chest pain--may repeat 3 times       . potassium chloride SA (K-DUR,KLOR-CON) 20 MEQ tablet TAKE 1 TABLET BY MOUTH DAILY.  90 tablet  2  . aspirin EC 81 MG EC tablet Take 1 tablet (81 mg total) by mouth daily.  30 tablet     No current facility-administered medications for this visit.    No Known Allergies  History   Social History  . Marital Status: Married    Spouse Name: N/A    Number of Children: N/A  . Years of Education: N/A   Occupational History  . Lawn care    Social History Main Topics  . Smoking status: Current Every Day Smoker    Types: Cigars  . Smokeless tobacco: Never Used     Comment: smokes cigars now, no longer smokes cigarettes but previously smoked 2ppd  . Alcohol Use: No  . Drug Use: No  . Sexually Active: Not on file   Other Topics Concern  . Not on file   Social History Narrative  . No narrative on file    Family History  Problem Relation Age of Onset  . Cancer Neg Hx   . Cirrhosis Mother     due to ETOH  Physical Exam: Filed Vitals:   09/03/12 0922  BP: 140/80  Pulse: 54  Height: 5\' 11"  (1.803 m)  Weight: 137 lb (62.143 kg)  SpO2: 94%    GEN- The patient is well appearing, alert and oriented x 3 today.   Head- normocephalic, atraumatic Eyes-  Sclera clear, conjunctiva pink Ears- hearing intact Oropharynx- clear Neck- supple, no JVP Lymph- no cervical lymphadenopathy Lungs- Clear to ausculation bilaterally, normal work of breathing Chest- ICD pocket is well healed Heart- Regular rate and rhythm, no murmurs, rubs or gallops, PMI not laterally displaced GI- soft, NT, ND, + BS Extremities- no clubbing, cyanosis, or edema  ekg today reveals sinus rhythm anterior infarctio, diffuse TW inversions  ICD interrogation- reviewed in detail today,  See PACEART report  Assessment and Plan:  1. Ischemic CM- no ischemic symptoms, euvolemic Normal ICD function See Pace Art report No changes today  He had  atrial lead noise on 2 prior episodes.  Once was when he was using a chain saw and the other time, he was using a large generator.   2. Tobacco - cessation advised He is not ready to quit  Merlin Return for research in 6 months Follow-up with Dr [name] (he has not seen him in some time)

## 2012-09-09 ENCOUNTER — Ambulatory Visit: Payer: Medicare Other | Admitting: Internal Medicine

## 2012-10-23 ENCOUNTER — Encounter (INDEPENDENT_AMBULATORY_CARE_PROVIDER_SITE_OTHER): Payer: Medicare Other

## 2012-10-23 DIAGNOSIS — I714 Abdominal aortic aneurysm, without rupture: Secondary | ICD-10-CM

## 2012-12-01 ENCOUNTER — Ambulatory Visit (INDEPENDENT_AMBULATORY_CARE_PROVIDER_SITE_OTHER): Payer: Medicare Other | Admitting: Cardiology

## 2012-12-01 ENCOUNTER — Encounter: Payer: Self-pay | Admitting: Cardiology

## 2012-12-01 VITALS — BP 124/81 | HR 58 | Ht 71.0 in | Wt 138.8 lb

## 2012-12-01 DIAGNOSIS — Z95810 Presence of automatic (implantable) cardiac defibrillator: Secondary | ICD-10-CM

## 2012-12-01 DIAGNOSIS — E785 Hyperlipidemia, unspecified: Secondary | ICD-10-CM

## 2012-12-01 DIAGNOSIS — Z0000 Encounter for general adult medical examination without abnormal findings: Secondary | ICD-10-CM

## 2012-12-01 DIAGNOSIS — I10 Essential (primary) hypertension: Secondary | ICD-10-CM

## 2012-12-01 DIAGNOSIS — I714 Abdominal aortic aneurysm, without rupture: Secondary | ICD-10-CM

## 2012-12-01 DIAGNOSIS — I2510 Atherosclerotic heart disease of native coronary artery without angina pectoris: Secondary | ICD-10-CM

## 2012-12-01 DIAGNOSIS — I2589 Other forms of chronic ischemic heart disease: Secondary | ICD-10-CM

## 2012-12-01 DIAGNOSIS — F17200 Nicotine dependence, unspecified, uncomplicated: Secondary | ICD-10-CM

## 2012-12-01 LAB — BASIC METABOLIC PANEL
Calcium: 9.4 mg/dL (ref 8.4–10.5)
Chloride: 100 mEq/L (ref 96–112)
Creatinine, Ser: 0.8 mg/dL (ref 0.4–1.5)

## 2012-12-01 NOTE — Assessment & Plan Note (Signed)
Continue present blood pressure medications. Check potassium and renal function. 

## 2012-12-01 NOTE — Assessment & Plan Note (Addendum)
Continue aspirin and statin. Schedule Myoview for risk stratification. 

## 2012-12-01 NOTE — Assessment & Plan Note (Signed)
Continue statin. 

## 2012-12-01 NOTE — Progress Notes (Signed)
HPI: FU coronary disease, ischemic cardiomyopathy, hypertension and hyperlipidemia. Cardiac history dates back to June of 2001. At that time the patient was found to have an abnormal electrocardiogram. Catheterization revealed a 95% mid LAD and a 70% diffuse right coronary artery. His ejection fraction was 25-30%. He had PCI of his LAD at that time. Last cardiac catheterization was performed in April of 2009. At that time he was found to have nonobstructive disease with the most significant being a 60-70% mid right coronary artery. He also had an ejection fraction of 15% with moderate mitral regurgitation. Last echocardiogram was performed in Sept 2010 and showed an ejection fraction of 30%. Abdominal ultrasound in Sept 2014 showed a 4.1x4.1 cm aneurysm and dilated right iliac. Followup recommended in six months. I last saw him in May of 2012. Since then, the patient has dyspnea with more extreme activities but not with routine activities. It is relieved with rest. It is not associated with chest pain. There is no orthopnea, PND or pedal edema. There is no syncope or palpitations. There is no exertional chest pain.    Current Outpatient Prescriptions  Medication Sig Dispense Refill  . aspirin EC 81 MG EC tablet Take 1 tablet (81 mg total) by mouth daily.  30 tablet    . atorvastatin (LIPITOR) 80 MG tablet TAKE 1 TABLET (80 MG TOTAL) BY MOUTH DAILY.  90 tablet  3  . carvedilol (COREG) 25 MG tablet Take 1 tablet (25 mg total) by mouth 2 (two) times daily with a meal.  180 tablet  4  . Fish Oil OIL Take 360 mg by mouth daily.      . furosemide (LASIX) 40 MG tablet TAKE 1 TABLET (40 MG TOTAL) BY MOUTH DAILY.  90 tablet  2  . lisinopril (PRINIVIL,ZESTRIL) 20 MG tablet TAKE 1 TABLET (20 MG TOTAL) BY MOUTH DAILY.  90 tablet  2  . Multiple Vitamin (MULTIVITAMIN) capsule Take 1 capsule by mouth daily.        . nitroGLYCERIN (NITROSTAT) 0.4 MG SL tablet One tablet under tongue every 5 minutes as needed  for chest pain--may repeat 3 times       . potassium chloride SA (K-DUR,KLOR-CON) 20 MEQ tablet TAKE 1 TABLET BY MOUTH DAILY.  90 tablet  2   No current facility-administered medications for this visit.     Past Medical History  Diagnosis Date  . CAD (coronary artery disease)   . Cardiomyopathy, ischemic   . CHF (congestive heart failure)     class II/III  . HTN (hypertension)   . Hyperlipidemia   . COPD (chronic obstructive pulmonary disease)     smokes cigars but has quit cigarettes  . PSA (psoriatic arthritis)     increased  . History of hematuria   . Adrenal mass     per pt this is remote (10 years) and benign by biopsy  . Anxiety   . PVD (peripheral vascular disease)   . Hx of colonoscopy     Past Surgical History  Procedure Laterality Date  . Coronary stent placement    . Cataract surgery    . Cardiac defibrillator placement  05/25/10    [employer] fortified ST DR ICD implanted by [facility] for primary prevention  of sudden death    History   Social History  . Marital Status: Married    Spouse Name: N/A    Number of Children: N/A  . Years of Education: N/A  Occupational History  . Lawn care    Social History Main Topics  . Smoking status: Current Every Day Smoker    Types: Cigars  . Smokeless tobacco: Never Used     Comment: smokes cigars now, no longer smokes cigarettes but previously smoked 2ppd  . Alcohol Use: No  . Drug Use: No  . Sexual Activity: Not on file   Other Topics Concern  . Not on file   Social History Narrative  . No narrative on file    ROS: no fevers or chills, productive cough, hemoptysis, dysphasia, odynophagia, melena, hematochezia, dysuria, hematuria, rash, seizure activity, orthopnea, PND, pedal edema, claudication. Remaining systems are negative.  Physical Exam: Well-developed well-nourished in no acute distress.  Skin is warm and dry.  HEENT is normal.  Neck is supple.  Chest is clear to auscultation with normal  expansion.  Cardiovascular exam is regular rate and rhythm.  Abdominal exam nontender or distended. No masses palpated. Extremities show no edema. neuro grossly intact  ECG sinus rhythm at a rate of 58. Prior septal infarct. Inferior lateral T-wave inversion.

## 2012-12-01 NOTE — Patient Instructions (Signed)
Your physician wants you to follow-up in: ONE YEAR WITH DR CRENSHAW You will receive a reminder letter in the mail two months in advance. If you don't receive a letter, please call our office to schedule the follow-up appointment.   Your physician recommends that you HAVE LAB WORK TODAY  Your physician has requested that you have a lexiscan myoview. For further information please visit [email or web address]. Please follow instruction sheet, as given.   

## 2012-12-01 NOTE — Addendum Note (Signed)
Addended by: Odysseus Cada, Bartolo Darter D on: 12/01/2012 01:18 PM   Modules accepted: Orders

## 2012-12-01 NOTE — Assessment & Plan Note (Signed)
Patient counseled on discontinuing. 

## 2012-12-01 NOTE — Assessment & Plan Note (Signed)
followup abdominal ultrasound September 2015.

## 2012-12-01 NOTE — Assessment & Plan Note (Signed)
Followed by electrophysiology. 

## 2012-12-01 NOTE — Assessment & Plan Note (Signed)
Continue ACE inhibitor and beta blocker. 

## 2012-12-07 ENCOUNTER — Ambulatory Visit (INDEPENDENT_AMBULATORY_CARE_PROVIDER_SITE_OTHER): Payer: Medicare Other | Admitting: *Deleted

## 2012-12-07 DIAGNOSIS — I428 Other cardiomyopathies: Secondary | ICD-10-CM

## 2012-12-08 LAB — REMOTE ICD DEVICE
AL IMPEDENCE ICD: 400 Ohm
BAMS-0001: 180 {beats}/min
DEV-0020ICD: NEGATIVE
RV LEAD AMPLITUDE: 11.8 mv
RV LEAD IMPEDENCE ICD: 340 Ohm
TZAT-0001SLOWVT: 1
TZAT-0004SLOWVT: 8
TZAT-0012SLOWVT: 200 ms
TZAT-0013SLOWVT: 2
TZAT-0018SLOWVT: NEGATIVE
TZAT-0019SLOWVT: 7.5 V
TZAT-0020SLOWVT: 1 ms
TZON-0004SLOWVT: 20
TZON-0005SLOWVT: 6
TZST-0001SLOWVT: 3
TZST-0001SLOWVT: 4
TZST-0001SLOWVT: 5
TZST-0003SLOWVT: 25 J
TZST-0003SLOWVT: 36 J
VENTRICULAR PACING ICD: 1 pct

## 2012-12-10 NOTE — Progress Notes (Signed)
Remote defib received  

## 2012-12-14 ENCOUNTER — Encounter: Payer: Self-pay | Admitting: *Deleted

## 2012-12-17 ENCOUNTER — Encounter: Payer: Self-pay | Admitting: Internal Medicine

## 2012-12-21 ENCOUNTER — Encounter: Payer: Self-pay | Admitting: *Deleted

## 2012-12-21 ENCOUNTER — Encounter: Payer: Self-pay | Admitting: Cardiology

## 2012-12-21 ENCOUNTER — Ambulatory Visit (HOSPITAL_COMMUNITY): Payer: Medicare Other | Attending: Cardiology | Admitting: Radiology

## 2012-12-21 VITALS — BP 139/77 | Ht 71.0 in | Wt 138.0 lb

## 2012-12-21 DIAGNOSIS — R0609 Other forms of dyspnea: Secondary | ICD-10-CM | POA: Insufficient documentation

## 2012-12-21 DIAGNOSIS — E785 Hyperlipidemia, unspecified: Secondary | ICD-10-CM | POA: Insufficient documentation

## 2012-12-21 DIAGNOSIS — R0989 Other specified symptoms and signs involving the circulatory and respiratory systems: Secondary | ICD-10-CM | POA: Insufficient documentation

## 2012-12-21 DIAGNOSIS — I509 Heart failure, unspecified: Secondary | ICD-10-CM | POA: Insufficient documentation

## 2012-12-21 DIAGNOSIS — R0602 Shortness of breath: Secondary | ICD-10-CM | POA: Insufficient documentation

## 2012-12-21 DIAGNOSIS — I739 Peripheral vascular disease, unspecified: Secondary | ICD-10-CM | POA: Insufficient documentation

## 2012-12-21 DIAGNOSIS — J4489 Other specified chronic obstructive pulmonary disease: Secondary | ICD-10-CM | POA: Insufficient documentation

## 2012-12-21 DIAGNOSIS — Z87891 Personal history of nicotine dependence: Secondary | ICD-10-CM | POA: Insufficient documentation

## 2012-12-21 DIAGNOSIS — Z9861 Coronary angioplasty status: Secondary | ICD-10-CM | POA: Insufficient documentation

## 2012-12-21 DIAGNOSIS — I2510 Atherosclerotic heart disease of native coronary artery without angina pectoris: Secondary | ICD-10-CM

## 2012-12-21 DIAGNOSIS — R002 Palpitations: Secondary | ICD-10-CM | POA: Insufficient documentation

## 2012-12-21 DIAGNOSIS — J449 Chronic obstructive pulmonary disease, unspecified: Secondary | ICD-10-CM | POA: Insufficient documentation

## 2012-12-21 MED ORDER — TECHNETIUM TC 99M SESTAMIBI GENERIC - CARDIOLITE
30.0000 | Freq: Once | INTRAVENOUS | Status: AC | PRN
  Administered 2012-12-21: 30 via INTRAVENOUS

## 2012-12-21 MED ORDER — TECHNETIUM TC 99M SESTAMIBI GENERIC - CARDIOLITE
10.0000 | Freq: Once | INTRAVENOUS | Status: AC | PRN
  Administered 2012-12-21: 10 via INTRAVENOUS

## 2012-12-21 MED ORDER — REGADENOSON 0.4 MG/5ML IV SOLN
0.4000 mg | Freq: Once | INTRAVENOUS | Status: AC
  Administered 2012-12-21: 0.4 mg via INTRAVENOUS

## 2012-12-21 NOTE — Progress Notes (Signed)
[facility] SITE 3 NUCLEAR MED [address] [place], [place] [address] [phone]    Cardiology Nuclear Med Study  Benjamin Cook is a 67 y.o. male     MRN : [id number]     DOB: Mar 12, 1945  Procedure Date: 12/21/2012  Nuclear Med Background Indication for Stress Test:  Evaluation for Ischemia and Stent Patency History:  COPD and ICCM/CHF, Stent: LAD PTCA: LAD, '10 ECHO: EF: 30%,  AA 4x4 Cardiac Risk Factors: History of Smoking, Lipids and PVD  Symptoms:  DOE, Palpitations and SOB   Nuclear Pre-Procedure Caffeine/Decaff Intake:  None NPO After: 7:30pm   Lungs:  clear O2 Sat: 95% on room air. IV 0.9% NS with Angio Cath:  22g  IV Site: R Antecubital  IV Started by:  Bonnita Levan, RN  Chest Size (in):  42 Cup Size: n/a  Height: 5\' 11"  (1.803 m)  Weight:  138 lb (62.596 kg)  BMI:  Body mass index is 19.26 kg/(m^2). Tech Comments:  N/A    Nuclear Med Study 1 or 2 day study: 1 day  Stress Test Type:  Lexiscan  Reading MD: Tobias Alexander, MD  Order Authorizing Provider:  Olga Millers, MD  Resting Radionuclide: Technetium 74m Sestamibi  Resting Radionuclide Dose: 11.0 mCi   Stress Radionuclide:  Technetium 100m Sestamibi  Stress Radionuclide Dose: 33.0 mCi           Stress Protocol Rest HR: 49 Stress HR: 82  Rest BP: 139/77 Stress BP: 141/80  Exercise Time (min): n/a METS: n/a   Predicted Max HR: 151 bpm % Max HR: 54.3 bpm Rate Pressure Product: 95284   Dose of Adenosine (mg):  n/a Dose of Lexiscan: 0.4 mg  Dose of Atropine (mg): n/a Dose of Dobutamine: n/a mcg/kg/min (at max HR)  Stress Test Technologist: Milana Na, EMT-P  Nuclear Technologist:  Domenic Polite, CNMT     Rest Procedure:  Myocardial perfusion imaging was performed at rest 45 minutes following the intravenous administration of Technetium 73m Sestamibi. Rest ECG: Sinus bradycardia, LVH with repolarization abnormalities  Stress Procedure:  The patient received IV Lexiscan 0.4 mg  over 15-seconds.  Technetium 52m Sestamibi injected at 30-seconds. This patient was sob and dizzy with the Lexiscan injection. Quantitative spect images were obtained after a 45 minute delay. Stress ECG: No significant change from baseline ECG  QPS Raw Data Images:  Normal; no motion artifact; normal heart/lung ratio. Stress Images:  A large size severe severity perfusion defect in the basal and mid anetroseptal, apical septal, true apex, basal and mid inferoseptal and inferior walls and apical inferior wall.  Rest Images:  A large size severe severity perfusion defect in the basal and mid anetroseptal, apical septal, true apex, basal and mid inferoseptal and inferior walls and apical inferior wall.  Subtraction (SDS):  No evidence of ischemia. Transient Ischemic Dilatation (Normal <1.22):  1.07 Lung/Heart Ratio (Normal <0.45):  0.28  Quantitative Gated Spect Images QGS EDV:  254 ml QGS ESV:  204 ml  Impression Exercise Capacity:  Lexiscan with no exercise. BP Response:  Normal blood pressure response. Clinical Symptoms:  There is dyspnea. ECG Impression:  No significant ST segment change suggestive of ischemia. Comparison with Prior Nuclear Study: No previous nuclear study performed  Overall Impression:  High risk stress nuclear study with a large scar in the LAD and RCA territory. No evidence of ischemia..   LV Ejection Fraction: 20%.  LV Wall Motion:  Global hypokinesis with dyskinetic apex and paradoxical septal  motion. Severely decreased LV EF.   Tobias Alexander, H 12/21/2012

## 2013-02-05 NOTE — Telephone Encounter (Signed)
No other info °

## 2013-03-17 ENCOUNTER — Ambulatory Visit (INDEPENDENT_AMBULATORY_CARE_PROVIDER_SITE_OTHER): Payer: Medicare Other | Admitting: *Deleted

## 2013-03-17 DIAGNOSIS — I2589 Other forms of chronic ischemic heart disease: Secondary | ICD-10-CM

## 2013-03-17 DIAGNOSIS — Z95810 Presence of automatic (implantable) cardiac defibrillator: Secondary | ICD-10-CM

## 2013-03-17 LAB — MDC_IDC_ENUM_SESS_TYPE_INCLINIC
Battery Remaining Longevity: 73.2 mo
Brady Statistic RA Percent Paced: 0.08 %
Brady Statistic RV Percent Paced: 0.04 %
HIGH POWER IMPEDANCE MEASURED VALUE: 72 Ohm
Implantable Pulse Generator Serial Number: 819770
Lead Channel Impedance Value: 375 Ohm
Lead Channel Pacing Threshold Amplitude: 0.5 V
Lead Channel Pacing Threshold Amplitude: 0.5 V
Lead Channel Pacing Threshold Amplitude: 1 V
Lead Channel Pacing Threshold Amplitude: 1 V
Lead Channel Pacing Threshold Pulse Width: 0.5 ms
Lead Channel Pacing Threshold Pulse Width: 0.5 ms
Lead Channel Pacing Threshold Pulse Width: 0.5 ms
Lead Channel Sensing Intrinsic Amplitude: 11.8 mV
Lead Channel Setting Pacing Amplitude: 2 V
Lead Channel Setting Pacing Amplitude: 2 V
Lead Channel Setting Pacing Pulse Width: 0.5 ms
Lead Channel Setting Sensing Sensitivity: 0.5 mV
MDC IDC MSMT LEADCHNL RA IMPEDANCE VALUE: 425 Ohm
MDC IDC MSMT LEADCHNL RA SENSING INTR AMPL: 2.6 mV
MDC IDC MSMT LEADCHNL RV PACING THRESHOLD PULSEWIDTH: 0.5 ms
MDC IDC SESS DTM: 20150211154515
MDC IDC SET ZONE DETECTION INTERVAL: 320 ms
Zone Setting Detection Interval: 240 ms

## 2013-03-17 NOTE — Progress Notes (Signed)
ICD check in clinic by [facility]. Normal device function. Thresholds and sensing consistent with previous device measurements. Impedance trends stable over time. No evidence of any ventricular arrhythmias. No mode switches. Histogram distribution appropriate for patient and level of activity. No changes made this session. Device programmed at appropriate safety margins. Device programmed to optimize intrinsic conduction. Estimated longevity 5.7-6.75yrs. Pt enrolled in remote follow-up.   Merlin Analyze 06/21/13 & ROV w/ Dr. Rayann Heman & Research in 61mo.

## 2013-03-26 ENCOUNTER — Encounter: Payer: Self-pay | Admitting: Internal Medicine

## 2013-05-31 ENCOUNTER — Other Ambulatory Visit: Payer: Self-pay

## 2013-05-31 MED ORDER — POTASSIUM CHLORIDE CRYS ER 20 MEQ PO TBCR: EXTENDED_RELEASE_TABLET | ORAL | Status: DC

## 2013-06-01 ENCOUNTER — Other Ambulatory Visit: Payer: Self-pay

## 2013-06-01 MED ORDER — FUROSEMIDE 40 MG PO TABS: ORAL_TABLET | ORAL | Status: DC

## 2013-06-21 ENCOUNTER — Ambulatory Visit (INDEPENDENT_AMBULATORY_CARE_PROVIDER_SITE_OTHER): Payer: Medicare Other | Admitting: *Deleted

## 2013-06-21 ENCOUNTER — Encounter: Payer: Self-pay | Admitting: Internal Medicine

## 2013-06-21 DIAGNOSIS — Z95810 Presence of automatic (implantable) cardiac defibrillator: Secondary | ICD-10-CM

## 2013-06-21 DIAGNOSIS — I2589 Other forms of chronic ischemic heart disease: Secondary | ICD-10-CM

## 2013-06-21 LAB — MDC_IDC_ENUM_SESS_TYPE_REMOTE
Brady Statistic RA Percent Paced: 1 %
Brady Statistic RV Percent Paced: 1 %
HighPow Impedance: 74 Ohm
Implantable Pulse Generator Serial Number: 819770
Lead Channel Impedance Value: 350 Ohm
Lead Channel Impedance Value: 400 Ohm
Lead Channel Sensing Intrinsic Amplitude: 1.6 mV
Lead Channel Sensing Intrinsic Amplitude: 11.8 mV
Lead Channel Setting Pacing Amplitude: 2 V
Lead Channel Setting Pacing Pulse Width: 0.5 ms
Lead Channel Setting Sensing Sensitivity: 0.5 mV
MDC IDC MSMT BATTERY REMAINING LONGEVITY: 73 mo
MDC IDC SET LEADCHNL RV PACING AMPLITUDE: 2 V
Zone Setting Detection Interval: 240 ms
Zone Setting Detection Interval: 320 ms

## 2013-06-21 NOTE — Progress Notes (Signed)
Remote ICD transmission.   

## 2013-06-23 ENCOUNTER — Other Ambulatory Visit: Payer: Self-pay | Admitting: *Deleted

## 2013-06-23 MED ORDER — LISINOPRIL 20 MG PO TABS: ORAL_TABLET | ORAL | Status: DC

## 2013-07-02 ENCOUNTER — Encounter: Payer: Self-pay | Admitting: Cardiology

## 2013-07-05 ENCOUNTER — Other Ambulatory Visit: Payer: Self-pay | Admitting: *Deleted

## 2013-07-05 MED ORDER — CARVEDILOL 25 MG PO TABS: 25.0000 mg | ORAL_TABLET | Freq: Two times a day (BID) | ORAL | Status: DC

## 2013-08-27 ENCOUNTER — Encounter: Payer: Medicare Other | Admitting: Internal Medicine

## 2013-09-22 ENCOUNTER — Encounter: Payer: Medicare Other | Admitting: Internal Medicine

## 2013-09-22 ENCOUNTER — Ambulatory Visit (INDEPENDENT_AMBULATORY_CARE_PROVIDER_SITE_OTHER): Payer: Medicare Other | Admitting: Internal Medicine

## 2013-09-22 ENCOUNTER — Encounter: Payer: Self-pay | Admitting: Internal Medicine

## 2013-09-22 VITALS — BP 146/88 | HR 57

## 2013-09-22 DIAGNOSIS — I2510 Atherosclerotic heart disease of native coronary artery without angina pectoris: Secondary | ICD-10-CM

## 2013-09-22 DIAGNOSIS — Z95810 Presence of automatic (implantable) cardiac defibrillator: Secondary | ICD-10-CM

## 2013-09-22 DIAGNOSIS — I5022 Chronic systolic (congestive) heart failure: Secondary | ICD-10-CM

## 2013-09-22 DIAGNOSIS — I2589 Other forms of chronic ischemic heart disease: Secondary | ICD-10-CM

## 2013-09-22 LAB — MDC_IDC_ENUM_SESS_TYPE_INCLINIC
Battery Remaining Longevity: 76.8 mo
Date Time Interrogation Session: 20150819105428
HIGH POWER IMPEDANCE MEASURED VALUE: 72 Ohm
Implantable Pulse Generator Serial Number: 819770
Lead Channel Impedance Value: 350 Ohm
Lead Channel Impedance Value: 412.5 Ohm
Lead Channel Pacing Threshold Amplitude: 0.5 V
Lead Channel Pacing Threshold Amplitude: 0.5 V
Lead Channel Pacing Threshold Amplitude: 0.75 V
Lead Channel Pacing Threshold Pulse Width: 0.5 ms
Lead Channel Pacing Threshold Pulse Width: 0.5 ms
Lead Channel Sensing Intrinsic Amplitude: 1.4 mV
Lead Channel Sensing Intrinsic Amplitude: 11.8 mV
Lead Channel Setting Pacing Pulse Width: 0.5 ms
Lead Channel Setting Sensing Sensitivity: 0.5 mV
MDC IDC MSMT LEADCHNL RA PACING THRESHOLD PULSEWIDTH: 0.5 ms
MDC IDC MSMT LEADCHNL RA PACING THRESHOLD PULSEWIDTH: 0.5 ms
MDC IDC MSMT LEADCHNL RV PACING THRESHOLD AMPLITUDE: 0.75 V
MDC IDC SET LEADCHNL RA PACING AMPLITUDE: 2 V
MDC IDC SET LEADCHNL RV PACING AMPLITUDE: 2 V
MDC IDC SET ZONE DETECTION INTERVAL: 240 ms
MDC IDC STAT BRADY RA PERCENT PACED: 0.08 %
MDC IDC STAT BRADY RV PERCENT PACED: 0.04 %
Zone Setting Detection Interval: 320 ms

## 2013-09-22 NOTE — Progress Notes (Signed)
Primary Cardiologist:  Dr [name]  The patient presents today for routine electrophysiology followup.  Today, he denies symptoms of palpitations, chest pain, shortness of breath, orthopnea, PND, lower extremity edema, dizziness, presyncope, syncope, or neurologic sequela.  He continues to smoke cigars and is not ready to quit. The patient feels that he is tolerating medications without difficulties and is otherwise without complaint today.   Past Medical History  Diagnosis Date  . CAD (coronary artery disease)   . Cardiomyopathy, ischemic   . CHF (congestive heart failure)     class II/III  . HTN (hypertension)   . Hyperlipidemia   . COPD (chronic obstructive pulmonary disease)     smokes cigars but has quit cigarettes  . PSA (psoriatic arthritis)     increased  . History of hematuria   . Adrenal mass     per pt this is remote (10 years) and benign by biopsy  . Anxiety   . PVD (peripheral vascular disease)   . Hx of colonoscopy    Past Surgical History  Procedure Laterality Date  . Coronary stent placement    . Cataract surgery    . Cardiac defibrillator placement  05/25/10    [employer]. Jude Medical fortified ST DR ICD implanted by [facility] for primary prevention  of sudden death    Current Outpatient Prescriptions  Medication Sig Dispense Refill  . aspirin EC 81 MG EC tablet Take 1 tablet (81 mg total) by mouth daily.  30 tablet    . atorvastatin (LIPITOR) 80 MG tablet TAKE 1 TABLET (80 MG TOTAL) BY MOUTH DAILY.  90 tablet  3  . carvedilol (COREG) 25 MG tablet Take 1 tablet (25 mg total) by mouth 2 (two) times daily with a meal.  180 tablet  0  . Fish Oil OIL Take 360 mg by mouth daily.      . furosemide (LASIX) 40 MG tablet TAKE 1 TABLET (40 MG TOTAL) BY MOUTH DAILY.  90 tablet  2  . lisinopril (PRINIVIL,ZESTRIL) 20 MG tablet TAKE 1 TABLET (20 MG TOTAL) BY MOUTH DAILY.  90 tablet  1  . Multiple Vitamin (MULTIVITAMIN) capsule Take 1 capsule by mouth daily.        . nitroGLYCERIN  (NITROSTAT) 0.4 MG SL tablet One tablet under tongue every 5 minutes as needed for chest pain--may repeat 3 times       . potassium chloride SA (K-DUR,KLOR-CON) 20 MEQ tablet TAKE 1 TABLET BY MOUTH DAILY.  90 tablet  0   No current facility-administered medications for this visit.    No Known Allergies  History   Social History  . Marital Status: Married    Spouse Name: N/A    Number of Children: N/A  . Years of Education: N/A   Occupational History  . Lawn care    Social History Main Topics  . Smoking status: Current Every Day Smoker    Types: Cigars  . Smokeless tobacco: Never Used     Comment: smokes cigars now, no longer smokes cigarettes but previously smoked 2ppd  . Alcohol Use: No  . Drug Use: No  . Sexual Activity: Not on file   Other Topics Concern  . Not on file   Social History Narrative  . No narrative on file    Family History  Problem Relation Age of Onset  . Cancer Neg Hx   . Cirrhosis Mother     due to ETOH   Physical Exam: Filed Vitals:   09/22/13 8299  BP: 146/88  Pulse: 57    GEN- The patient is well appearing, alert and oriented x 3 today.   Head- normocephalic, atraumatic Eyes-  Sclera clear, conjunctiva pink Ears- hearing intact Oropharynx- clear Neck- supple, no JVP Lymph- no cervical lymphadenopathy Lungs- Clear to ausculation bilaterally, normal work of breathing Chest- ICD pocket is well healed Heart- Regular rate and rhythm, no murmurs, rubs or gallops, PMI not laterally displaced GI- soft, NT, ND, + BS Extremities- no clubbing, cyanosis, or edema  ekg today reveals sinus rhythm anterior infarctio, diffunse TW inversions unchanged from prior  ICD interrogation- reviewed in detail today,  See PACEART report  Assessment and Plan:  1. Ischemic CM- no ischemic symptoms, euvolemic Normal ICD function See Pace Art report No changes today   2. Tobacco - cessation advised He is not ready to quit  [facility] Return for research  in 6 months Follow-up with Dr [name] as scheduled I will see again in a year

## 2013-09-22 NOTE — Patient Instructions (Signed)
Remote monitoring is used to monitor your Pacemaker of ICD from home. This monitoring reduces the number of office visits required to check your device to one time per year. It allows [place] to keep an eye on the functioning of your device to ensure it is working properly. You are scheduled for a device check from home on 12/22/13. You may send your transmission at any time that day. If you have a wireless device, the transmission will be sent automatically. After your physician reviews your transmission, you will receive a postcard with your next transmission date.  Your physician wants you to follow-up in: 6 months with Research. You will receive a reminder letter in the mail two months in advance. If you don't receive a letter, please call our office to schedule the follow-up appointment. Your physician wants you to follow-up in: 1 year with Dr. Rayann Heman. You will receive a reminder letter in the mail two months in advance. If you don't receive a letter, please call our office to schedule the follow-up appointment. Your physician recommends that you continue on your current medications as directed. Please refer to the Current Medication list given to you today.

## 2013-09-23 ENCOUNTER — Encounter: Payer: Self-pay | Admitting: Internal Medicine

## 2013-10-07 ENCOUNTER — Other Ambulatory Visit: Payer: Self-pay

## 2013-10-07 MED ORDER — POTASSIUM CHLORIDE CRYS ER 20 MEQ PO TBCR: EXTENDED_RELEASE_TABLET | ORAL | Status: DC

## 2013-10-26 ENCOUNTER — Other Ambulatory Visit: Payer: Self-pay

## 2013-10-26 MED ORDER — CARVEDILOL 25 MG PO TABS: 25.0000 mg | ORAL_TABLET | Freq: Two times a day (BID) | ORAL | Status: DC

## 2013-11-17 ENCOUNTER — Other Ambulatory Visit: Payer: Self-pay | Admitting: *Deleted

## 2013-11-17 MED ORDER — ATORVASTATIN CALCIUM 80 MG PO TABS: ORAL_TABLET | ORAL | Status: DC

## 2013-12-20 ENCOUNTER — Encounter: Payer: Medicare Other | Admitting: Cardiology

## 2013-12-20 NOTE — Progress Notes (Signed)
HPI: FU coronary disease, ischemic cardiomyopathy, hypertension and hyperlipidemia. Cardiac history dates back to June of 2001. At that time the patient was found to have an abnormal electrocardiogram. Catheterization revealed a 95% mid LAD and a 70% diffuse right coronary artery. His ejection fraction was 25-30%. He had PCI of his LAD at that time. Last cardiac catheterization was performed in April of 2009. At that time he was found to have nonobstructive disease with the most significant being a 60-70% mid right coronary artery. He also had an ejection fraction of 15% with moderate mitral regurgitation. Last echocardiogram was performed in Sept 2010 and showed an ejection fraction of 30%. Nuclear study 11/14 showed EF 20 and scar in LAD and RCA territory. Abdominal ultrasound in Sept 2014 showed a 4.1x4.1 cm aneurysm and dilated right iliac. Followup recommended in six months. Since I last saw him,   Current Outpatient Prescriptions  Medication Sig Dispense Refill  . aspirin EC 81 MG EC tablet Take 1 tablet (81 mg total) by mouth daily. 30 tablet   . atorvastatin (LIPITOR) 80 MG tablet TAKE 1 TABLET (80 MG TOTAL) BY MOUTH DAILY. 90 tablet 1  . carvedilol (COREG) 25 MG tablet Take 1 tablet (25 mg total) by mouth 2 (two) times daily with a meal. 180 tablet 0  . Fish Oil OIL Take 360 mg by mouth daily.    . furosemide (LASIX) 40 MG tablet TAKE 1 TABLET (40 MG TOTAL) BY MOUTH DAILY. 90 tablet 2  . lisinopril (PRINIVIL,ZESTRIL) 20 MG tablet TAKE 1 TABLET (20 MG TOTAL) BY MOUTH DAILY. 90 tablet 1  . Multiple Vitamin (MULTIVITAMIN) capsule Take 1 capsule by mouth daily.      . nitroGLYCERIN (NITROSTAT) 0.4 MG SL tablet One tablet under tongue every 5 minutes as needed for chest pain--may repeat 3 times     . potassium chloride SA (K-DUR,KLOR-CON) 20 MEQ tablet TAKE 1 TABLET BY MOUTH DAILY. 90 tablet 0   No current facility-administered medications for this visit.     Past Medical History    Diagnosis Date  . CAD (coronary artery disease)   . Cardiomyopathy, ischemic   . CHF (congestive heart failure)     class II/III  . HTN (hypertension)   . Hyperlipidemia   . COPD (chronic obstructive pulmonary disease)     smokes cigars but has quit cigarettes  . PSA (psoriatic arthritis)     increased  . History of hematuria   . Adrenal mass     per pt this is remote (10 years) and benign by biopsy  . Anxiety   . PVD (peripheral vascular disease)   . Hx of colonoscopy     Past Surgical History  Procedure Laterality Date  . Coronary stent placement    . Cataract surgery    . Cardiac defibrillator placement  05/25/10    [employer]. Jude Medical fortified ST DR ICD implanted by [facility] for primary prevention  of sudden death    History   Social History  . Marital Status: Married    Spouse Name: N/A    Number of Children: N/A  . Years of Education: N/A   Occupational History  . Lawn care    Social History Main Topics  . Smoking status: Current Every Day Smoker    Types: Cigars  . Smokeless tobacco: Never Used     Comment: smokes cigars now, no longer smokes cigarettes but previously smoked 2ppd  . Alcohol Use: No  .  Drug Use: No  . Sexual Activity: Not on file   Other Topics Concern  . Not on file   Social History Narrative  . No narrative on file    ROS: no fevers or chills, productive cough, hemoptysis, dysphasia, odynophagia, melena, hematochezia, dysuria, hematuria, rash, seizure activity, orthopnea, PND, pedal edema, claudication. Remaining systems are negative.  Physical Exam: Well-developed well-nourished in no acute distress.  Skin is warm and dry.  HEENT is normal.  Neck is supple.  Chest is clear to auscultation with normal expansion.  Cardiovascular exam is regular rate and rhythm.  Abdominal exam nontender or distended. No masses palpated. Extremities show no edema. neuro grossly intact  ECG     This encounter was created in error - please  disregard.

## 2013-12-22 ENCOUNTER — Telehealth: Payer: Self-pay | Admitting: Cardiology

## 2013-12-22 ENCOUNTER — Ambulatory Visit (INDEPENDENT_AMBULATORY_CARE_PROVIDER_SITE_OTHER): Payer: Medicare Other | Admitting: *Deleted

## 2013-12-22 DIAGNOSIS — I429 Cardiomyopathy, unspecified: Secondary | ICD-10-CM

## 2013-12-22 LAB — MDC_IDC_ENUM_SESS_TYPE_REMOTE
Battery Remaining Percentage: 63 %
Brady Statistic RV Percent Paced: 1 % — CL
HIGH POWER IMPEDANCE MEASURED VALUE: 73 Ohm
Implantable Pulse Generator Serial Number: 819770
Lead Channel Impedance Value: 340 Ohm
Lead Channel Impedance Value: 400 Ohm
Lead Channel Sensing Intrinsic Amplitude: 2 mV
Lead Channel Setting Pacing Pulse Width: 0.5 ms
Lead Channel Setting Sensing Sensitivity: 0.5 mV
MDC IDC MSMT LEADCHNL RV SENSING INTR AMPL: 11.8 mV
MDC IDC SET LEADCHNL RA PACING AMPLITUDE: 2 V
MDC IDC SET LEADCHNL RV PACING AMPLITUDE: 2 V
MDC IDC SET ZONE DETECTION INTERVAL: 240 ms
MDC IDC STAT BRADY RA PERCENT PACED: 1 % — AB
Zone Setting Detection Interval: 320 ms

## 2013-12-22 NOTE — Telephone Encounter (Signed)
Confirmed remote transmission with pt wife.

## 2013-12-23 NOTE — Progress Notes (Signed)
Remote ICD transmission.   

## 2014-01-05 ENCOUNTER — Encounter: Payer: Self-pay | Admitting: Cardiology

## 2014-01-06 ENCOUNTER — Encounter: Payer: Self-pay | Admitting: Cardiology

## 2014-01-10 ENCOUNTER — Encounter: Payer: Self-pay | Admitting: Internal Medicine

## 2014-01-20 ENCOUNTER — Encounter: Payer: Self-pay | Admitting: Cardiology

## 2014-01-21 ENCOUNTER — Other Ambulatory Visit: Payer: Self-pay | Admitting: Cardiology

## 2014-01-21 ENCOUNTER — Other Ambulatory Visit: Payer: Self-pay | Admitting: Internal Medicine

## 2014-01-25 ENCOUNTER — Telehealth: Payer: Self-pay

## 2014-01-25 NOTE — Telephone Encounter (Signed)
Called and spoke with pt's wife about flu shot. She stated the pt has not had a flu shot done this year. Wife will advise pt to get flu shot.

## 2014-03-14 ENCOUNTER — Ambulatory Visit (INDEPENDENT_AMBULATORY_CARE_PROVIDER_SITE_OTHER): Payer: Medicare Other | Admitting: *Deleted

## 2014-03-14 DIAGNOSIS — Z95810 Presence of automatic (implantable) cardiac defibrillator: Secondary | ICD-10-CM

## 2014-03-14 LAB — MDC_IDC_ENUM_SESS_TYPE_INCLINIC: Implantable Pulse Generator Serial Number: 819770

## 2014-03-14 NOTE — Progress Notes (Signed)
Attempted to interrogate device w/ three different programmers; unsuccessful w/ all three. Shawn called [facility] tech svcs. Tech svcs suggested pt attempt to send a [facility]lin transmission. If unsuccessful, next steps TBD.

## 2014-03-15 ENCOUNTER — Telehealth: Payer: Self-pay | Admitting: *Deleted

## 2014-03-15 NOTE — Telephone Encounter (Signed)
Multiple unsuccessful attempts made to check device with all 3 [facility] programmers. [facility] tech support called and diagnostics sent to [facility] for review. Pt advised to attempt manual remote transmission, which was also unsuccessful. Dr. Rayann Heman reviewed information, potential depleted battery/device failure. Appt made to discuss options/treatment with pt and wife on 03/16/14 @ 1030. Pt and wife verbalize understanding and grateful for information and follow up.

## 2014-03-16 ENCOUNTER — Encounter: Payer: Self-pay | Admitting: *Deleted

## 2014-03-16 ENCOUNTER — Encounter: Payer: Self-pay | Admitting: Internal Medicine

## 2014-03-16 ENCOUNTER — Ambulatory Visit (INDEPENDENT_AMBULATORY_CARE_PROVIDER_SITE_OTHER): Payer: Medicare Other | Admitting: Internal Medicine

## 2014-03-16 VITALS — BP 132/84 | HR 58 | Ht 71.0 in | Wt 145.0 lb

## 2014-03-16 DIAGNOSIS — I5022 Chronic systolic (congestive) heart failure: Secondary | ICD-10-CM | POA: Diagnosis not present

## 2014-03-16 DIAGNOSIS — I255 Ischemic cardiomyopathy: Secondary | ICD-10-CM | POA: Diagnosis not present

## 2014-03-16 DIAGNOSIS — I2510 Atherosclerotic heart disease of native coronary artery without angina pectoris: Secondary | ICD-10-CM | POA: Diagnosis not present

## 2014-03-16 DIAGNOSIS — I429 Cardiomyopathy, unspecified: Secondary | ICD-10-CM | POA: Diagnosis not present

## 2014-03-16 DIAGNOSIS — F17200 Nicotine dependence, unspecified, uncomplicated: Secondary | ICD-10-CM

## 2014-03-16 DIAGNOSIS — Z95810 Presence of automatic (implantable) cardiac defibrillator: Secondary | ICD-10-CM

## 2014-03-16 DIAGNOSIS — Z720 Tobacco use: Secondary | ICD-10-CM

## 2014-03-16 LAB — BASIC METABOLIC PANEL
BUN: 12 mg/dL (ref 6–23)
CO2: 31 mEq/L (ref 19–32)
Calcium: 9.5 mg/dL (ref 8.4–10.5)
Chloride: 103 mEq/L (ref 96–112)
Creatinine, Ser: 0.85 mg/dL (ref 0.40–1.50)
GFR: 114.41 mL/min (ref >60.00)
Glucose, Bld: 94 mg/dL (ref 70–99)
POTASSIUM: 4 meq/L (ref 3.5–5.1)
Sodium: 137 mEq/L (ref 135–145)

## 2014-03-16 LAB — CBC WITH DIFFERENTIAL/PLATELET
BASOS ABS: 0 10*3/uL (ref 0.0–0.1)
Basophils Relative: 0.5 % (ref 0.0–3.0)
EOS ABS: 0.3 10*3/uL (ref 0.0–0.7)
Eosinophils Relative: 3.3 % (ref 0.0–5.0)
HEMATOCRIT: 45.7 % (ref 39.0–52.0)
Hemoglobin: 15.4 g/dL (ref 13.0–17.0)
Lymphocytes Relative: 27.6 % (ref 12.0–46.0)
Lymphs Abs: 2.3 10*3/uL (ref 0.7–4.0)
MCHC: 33.6 g/dL (ref 30.0–36.0)
MCV: 90.4 fl (ref 78.0–100.0)
MONO ABS: 0.9 10*3/uL (ref 0.1–1.0)
Monocytes Relative: 10.8 % (ref 3.0–12.0)
NEUTROS ABS: 4.8 10*3/uL (ref 1.4–7.7)
Neutrophils Relative %: 57.8 % (ref 43.0–77.0)
PLATELETS: 197 10*3/uL (ref 150.0–400.0)
RBC: 5.06 Mil/uL (ref 4.22–5.81)
RDW: 15.4 % (ref 11.5–15.5)
WBC: 8.3 10*3/uL (ref 4.0–10.5)

## 2014-03-16 NOTE — Progress Notes (Signed)
Electrophysiology Office Note   Date:  03/16/2014   ID:  Benjamin Cook, DOB May 11, 1945, MRN [id number]  PCP:  Renato Shin, MD  Cardiologist:  Dr [name] Primary Electrophysiologist: Thompson Grayer, MD    Chief Complaint  Patient presents with  . Shortness of Breath     History of Present Illness: Benjamin Cook is a 69 y.o. male who presents today for electrophysiology evaluation.   He returns today to evaluate his ICD.  His ICD recently has stopped working.  With research visit earlier this week, industry could not interrogate his device.  Fortunately, he is not device dependant.  He is doing well.  He remains active.  He still smokes cigars and is not ready to quit. Today, he denies symptoms of palpitations, chest pain, shortness of breath, orthopnea, PND, lower extremity edema, claudication, dizziness, presyncope, syncope, bleeding, or neurologic sequela. The patient is tolerating medications without difficulties and is otherwise without complaint today.    Past Medical History  Diagnosis Date  . CAD (coronary artery disease)   . Cardiomyopathy, ischemic   . CHF (congestive heart failure)     class II/III  . HTN (hypertension)   . Hyperlipidemia   . COPD (chronic obstructive pulmonary disease)     smokes cigars but has quit cigarettes  . PSA (psoriatic arthritis)     increased  . History of hematuria   . Adrenal mass     per pt this is remote (10 years) and benign by biopsy  . Anxiety   . PVD (peripheral vascular disease)   . Hx of colonoscopy    Past Surgical History  Procedure Laterality Date  . Coronary stent placement    . Cataract surgery    . Cardiac defibrillator placement  05/25/10    [employer] fortified ST DR ICD implanted by [facility] for primary prevention  of sudden death     Current Outpatient Prescriptions  Medication Sig Dispense Refill  . aspirin EC 81 MG EC tablet Take 1 tablet (81 mg total) by mouth daily. 30 tablet   . atorvastatin  (LIPITOR) 80 MG tablet TAKE 1 TABLET (80 MG TOTAL) BY MOUTH DAILY. 90 tablet 1  . carvedilol (COREG) 25 MG tablet TAKE 1 TABLET (25 MG TOTAL) BY MOUTH 2 (TWO) TIMES DAILY WITH A MEAL. 180 tablet 2  . Fish Oil OIL Take 360 mg by mouth daily.    . furosemide (LASIX) 40 MG tablet TAKE 1 TABLET (40 MG TOTAL) BY MOUTH DAILY. 90 tablet 2  . KLOR-CON M20 20 MEQ tablet TAKE 1 TABLET BY MOUTH DAILY. 90 tablet 0  . lisinopril (PRINIVIL,ZESTRIL) 20 MG tablet TAKE 1 TABLET (20 MG TOTAL) BY MOUTH DAILY. 90 tablet 1  . Multiple Vitamin (MULTIVITAMIN) capsule Take 1 capsule by mouth daily.      . nitroGLYCERIN (NITROSTAT) 0.4 MG SL tablet One tablet under tongue every 5 minutes as needed for chest pain--may repeat 3 times      No current facility-administered medications for this visit.    Allergies:   Review of patient's allergies indicates no known allergies.   Social History:  The patient  reports that he has been smoking Cigars.  He has never used smokeless tobacco. He reports that he does not drink alcohol or use illicit drugs.   Family History:  The patient's family history includes Cirrhosis in his mother. There is no history of Cancer.    ROS:  Please see the history of present illness.  All other systems are reviewed and negative.    PHYSICAL EXAM: VS:  BP 132/84 mmHg  Pulse 58  Ht 5\' 11"  (1.803 m)  Wt 145 lb (65.772 kg)  BMI 20.23 kg/m2 , BMI Body mass index is 20.23 kg/(m^2). GEN: Well nourished, well developed, in no acute distress HEENT: normal Neck: no JVD, carotid bruits, or masses Cardiac: RRR; no murmurs, rubs, or gallops,no edema  Respiratory:  clear to auscultation bilaterally, normal work of breathing GI: soft, nontender, nondistended, + BS MS: no deformity or atrophy Skin: warm and dry, device pocket is well healed Neuro:  Strength and sensation are intact Psych: euthymic mood, full affect  Echo 2010 reveals EF 30%  Device interrogation is attempted today and we are  still unable to interrogated this device.   Recent Labs: No results found for requested labs within last 365 days.    Lipid Panel     Component Value Date/Time   CHOL 115 04/15/2012 1041   TRIG 46.0 04/15/2012 1041   HDL 40.80 04/15/2012 1041   CHOLHDL 3 04/15/2012 1041   VLDL 9.2 04/15/2012 1041   LDLCALC 65 04/15/2012 1041     Wt Readings from Last 3 Encounters:  03/16/14 145 lb (65.772 kg)  12/21/12 138 lb (62.596 kg)  12/01/12 138 lb 12.8 oz (62.959 kg)     ASSESSMENT AND PLAN:  1.  Ischemic CM/ chronic systolic dysfunction No ischemic symptoms euvolemic today His ICD has malfunctioned (I suspect a battery issue).  [facility] is aware and advised generator replacement (per my phone discussion with Karilyn Cota). Risks, benefits, and alternatives to ICD pulse generator replacement were discussed in detail today.  The patient understands that risks include but are not limited to bleeding, infection, pneumothorax, perforation, tamponade, vascular damage, renal failure, MI, stroke, death, inappropriate shocks, damage to his existing leads, and lead dislodgement and wishes to proceed.  We will therefore schedule the procedure at the next available time. A high level of decision making is required for evaluation and management of his malfunctioning ICD.  I will replace the device this week as he is at risk of further complications with the malfunctioning device.  2. HTN Stable No change required today  3. CAD No ischemic symptoms  4. Tobacco Cessation is advised He has quit cigarettes but does not wish to quite cigars   Current medicines are reviewed at length with the patient today.   The patient does not have concerns regarding his medicines.  The following changes were made today:  none  Signed, Thompson Grayer, MD  03/16/2014 10:54 AM     [facility] [address] [place] [address] [phone] (office) [phone] (fax)

## 2014-03-16 NOTE — Patient Instructions (Signed)
Your physician recommends that you schedule a follow-up appointment in: 7-10 days from 03/18/14 in the device clinic for wound check  See instruction sheet for procedure

## 2014-03-17 DIAGNOSIS — T82111A Breakdown (mechanical) of cardiac pulse generator (battery), initial encounter: Secondary | ICD-10-CM | POA: Diagnosis not present

## 2014-03-17 DIAGNOSIS — J449 Chronic obstructive pulmonary disease, unspecified: Secondary | ICD-10-CM | POA: Diagnosis not present

## 2014-03-17 DIAGNOSIS — E785 Hyperlipidemia, unspecified: Secondary | ICD-10-CM | POA: Diagnosis not present

## 2014-03-17 DIAGNOSIS — F419 Anxiety disorder, unspecified: Secondary | ICD-10-CM | POA: Diagnosis not present

## 2014-03-17 DIAGNOSIS — I2510 Atherosclerotic heart disease of native coronary artery without angina pectoris: Secondary | ICD-10-CM | POA: Diagnosis not present

## 2014-03-17 DIAGNOSIS — Y838 Other surgical procedures as the cause of abnormal reaction of the patient, or of later complication, without mention of misadventure at the time of the procedure: Secondary | ICD-10-CM | POA: Diagnosis not present

## 2014-03-17 DIAGNOSIS — I10 Essential (primary) hypertension: Secondary | ICD-10-CM | POA: Diagnosis not present

## 2014-03-17 DIAGNOSIS — Z716 Tobacco abuse counseling: Secondary | ICD-10-CM | POA: Diagnosis not present

## 2014-03-17 DIAGNOSIS — F17210 Nicotine dependence, cigarettes, uncomplicated: Secondary | ICD-10-CM | POA: Diagnosis not present

## 2014-03-17 DIAGNOSIS — Z7982 Long term (current) use of aspirin: Secondary | ICD-10-CM | POA: Diagnosis not present

## 2014-03-17 DIAGNOSIS — I739 Peripheral vascular disease, unspecified: Secondary | ICD-10-CM | POA: Diagnosis not present

## 2014-03-17 DIAGNOSIS — I255 Ischemic cardiomyopathy: Secondary | ICD-10-CM | POA: Diagnosis not present

## 2014-03-17 DIAGNOSIS — I509 Heart failure, unspecified: Secondary | ICD-10-CM | POA: Diagnosis not present

## 2014-03-17 MED ORDER — CEFAZOLIN SODIUM-DEXTROSE 2-3 GM-% IV SOLR: 2.0000 g | INTRAVENOUS | Status: DC

## 2014-03-17 MED ORDER — SODIUM CHLORIDE 0.9 % IR SOLN
80.0000 mg | Status: DC
  Filled 2014-03-17: qty 2

## 2014-03-17 MED ORDER — SODIUM CHLORIDE 0.9 % IV SOLN
INTRAVENOUS | Status: DC
  Administered 2014-03-18: 09:00:00 via INTRAVENOUS

## 2014-03-18 ENCOUNTER — Ambulatory Visit (HOSPITAL_COMMUNITY)
Admission: RE | Admit: 2014-03-18 | Discharge: 2014-03-18 | Disposition: A | Discharge status: Home / Self Care | Payer: Medicare Other | Source: Ambulatory Visit | Attending: Internal Medicine | Admitting: Internal Medicine

## 2014-03-18 ENCOUNTER — Encounter (HOSPITAL_COMMUNITY)
Admission: RE | Disposition: A | Discharge status: Home / Self Care | Payer: Self-pay | Source: Ambulatory Visit | Attending: Internal Medicine

## 2014-03-18 ENCOUNTER — Encounter (HOSPITAL_COMMUNITY): Payer: Self-pay | Admitting: *Deleted

## 2014-03-18 DIAGNOSIS — I509 Heart failure, unspecified: Secondary | ICD-10-CM | POA: Insufficient documentation

## 2014-03-18 DIAGNOSIS — T82118A Breakdown (mechanical) of other cardiac electronic device, initial encounter: Secondary | ICD-10-CM

## 2014-03-18 DIAGNOSIS — J449 Chronic obstructive pulmonary disease, unspecified: Secondary | ICD-10-CM | POA: Insufficient documentation

## 2014-03-18 DIAGNOSIS — I10 Essential (primary) hypertension: Secondary | ICD-10-CM | POA: Diagnosis not present

## 2014-03-18 DIAGNOSIS — Z7982 Long term (current) use of aspirin: Secondary | ICD-10-CM | POA: Diagnosis not present

## 2014-03-18 DIAGNOSIS — I739 Peripheral vascular disease, unspecified: Secondary | ICD-10-CM | POA: Insufficient documentation

## 2014-03-18 DIAGNOSIS — I2510 Atherosclerotic heart disease of native coronary artery without angina pectoris: Secondary | ICD-10-CM | POA: Insufficient documentation

## 2014-03-18 DIAGNOSIS — T82111A Breakdown (mechanical) of cardiac pulse generator (battery), initial encounter: Secondary | ICD-10-CM | POA: Insufficient documentation

## 2014-03-18 DIAGNOSIS — I5020 Unspecified systolic (congestive) heart failure: Secondary | ICD-10-CM | POA: Diagnosis present

## 2014-03-18 DIAGNOSIS — I5023 Acute on chronic systolic (congestive) heart failure: Secondary | ICD-10-CM | POA: Diagnosis present

## 2014-03-18 DIAGNOSIS — Z716 Tobacco abuse counseling: Secondary | ICD-10-CM | POA: Diagnosis not present

## 2014-03-18 DIAGNOSIS — I429 Cardiomyopathy, unspecified: Secondary | ICD-10-CM

## 2014-03-18 DIAGNOSIS — E785 Hyperlipidemia, unspecified: Secondary | ICD-10-CM | POA: Diagnosis not present

## 2014-03-18 DIAGNOSIS — Z95810 Presence of automatic (implantable) cardiac defibrillator: Secondary | ICD-10-CM

## 2014-03-18 DIAGNOSIS — F17210 Nicotine dependence, cigarettes, uncomplicated: Secondary | ICD-10-CM | POA: Diagnosis not present

## 2014-03-18 DIAGNOSIS — I5022 Chronic systolic (congestive) heart failure: Secondary | ICD-10-CM | POA: Diagnosis present

## 2014-03-18 DIAGNOSIS — I255 Ischemic cardiomyopathy: Secondary | ICD-10-CM | POA: Diagnosis not present

## 2014-03-18 DIAGNOSIS — Y838 Other surgical procedures as the cause of abnormal reaction of the patient, or of later complication, without mention of misadventure at the time of the procedure: Secondary | ICD-10-CM | POA: Insufficient documentation

## 2014-03-18 DIAGNOSIS — F419 Anxiety disorder, unspecified: Secondary | ICD-10-CM | POA: Insufficient documentation

## 2014-03-18 HISTORY — PX: IMPLANTABLE CARDIOVERTER DEFIBRILLATOR (ICD) GENERATOR CHANGE: SHX5469

## 2014-03-18 LAB — SURGICAL PCR SCREEN
MRSA, PCR: NEGATIVE
Staphylococcus aureus: NEGATIVE

## 2014-03-18 SURGERY — ICD GENERATOR CHANGE

## 2014-03-18 MED ORDER — FENTANYL CITRATE 0.05 MG/ML IJ SOLN
INTRAMUSCULAR | Status: AC
  Filled 2014-03-18: qty 2

## 2014-03-18 MED ORDER — CEFAZOLIN SODIUM-DEXTROSE 2-3 GM-% IV SOLR
INTRAVENOUS | Status: AC
  Filled 2014-03-18: qty 50

## 2014-03-18 MED ORDER — MUPIROCIN 2 % EX OINT
TOPICAL_OINTMENT | Freq: Two times a day (BID) | CUTANEOUS | Status: DC
  Filled 2014-03-18: qty 22

## 2014-03-18 MED ORDER — MUPIROCIN 2 % EX OINT
TOPICAL_OINTMENT | CUTANEOUS | Status: AC
  Filled 2014-03-18: qty 22

## 2014-03-18 MED ORDER — ACETAMINOPHEN 325 MG PO TABS
325.0000 mg | ORAL_TABLET | ORAL | Status: DC | PRN
  Filled 2014-03-18: qty 2

## 2014-03-18 MED ORDER — SODIUM CHLORIDE 0.9 % IJ SOLN: 3.0000 mL | Freq: Two times a day (BID) | INTRAMUSCULAR | Status: DC

## 2014-03-18 MED ORDER — CHLORHEXIDINE GLUCONATE 4 % EX LIQD
60.0000 mL | Freq: Once | CUTANEOUS | Status: DC
  Filled 2014-03-18: qty 60

## 2014-03-18 MED ORDER — SODIUM CHLORIDE 0.9 % IJ SOLN: 3.0000 mL | INTRAMUSCULAR | Status: DC | PRN

## 2014-03-18 MED ORDER — LIDOCAINE HCL (PF) 1 % IJ SOLN
INTRAMUSCULAR | Status: AC
  Filled 2014-03-18: qty 60

## 2014-03-18 MED ORDER — MIDAZOLAM HCL 5 MG/5ML IJ SOLN
INTRAMUSCULAR | Status: AC
  Filled 2014-03-18: qty 5

## 2014-03-18 MED ORDER — ONDANSETRON HCL 4 MG/2ML IJ SOLN: 4.0000 mg | Freq: Four times a day (QID) | INTRAMUSCULAR | Status: DC | PRN

## 2014-03-18 MED ORDER — SODIUM CHLORIDE 0.9 % IV SOLN: 250.0000 mL | INTRAVENOUS | Status: DC | PRN

## 2014-03-18 NOTE — Discharge Instructions (Signed)
Pacemaker Battery Change, Care After °Refer to this sheet in the next few weeks. These instructions provide you with information on caring for yourself after your procedure. Your health care provider may also give you more specific instructions. Your treatment has been planned according to current medical practices, but problems sometimes occur. Call your health care provider if you have any problems or questions after your procedure. °WHAT TO EXPECT AFTER THE PROCEDURE °After your procedure, it is typical to have the following sensations: °· Soreness at the pacemaker site. °HOME CARE INSTRUCTIONS  °· Keep the incision clean and dry. °· Unless advised otherwise, you may shower beginning 48 hours after your procedure. °· For the first week after the replacement, avoid stretching motions that pull at the incision site, and avoid heavy exercise with the arm that is on the same side as the incision. °· Take medicines only as directed by your health care provider. °· Keep all follow-up visits as directed by your health care provider. °SEEK MEDICAL CARE IF:  °· You have pain at the incision site that is not relieved by over-the-counter or prescription medicine. °· There is drainage or pus from the incision site. °· There is swelling larger than a lime at the incision site. °· You develop red streaking that extends above or below the incision site. °· You feel brief, intermittent palpitations, light-headedness, or any symptoms that you feel might be related to your heart. °SEEK IMMEDIATE MEDICAL CARE IF:  °· You experience chest pain that is different than the pain at the pacemaker site. °· You experience shortness of breath. °· You have palpitations or irregular heartbeat. °· You have light-headedness that does not go away quickly. °· You faint. °· You have pain that gets worse and is not relieved by medicine. °Document Released: 11/11/2012 Document Revised: 06/07/2013 Document Reviewed: 11/11/2012 °ExitCare® Patient  Information ©2015 [employer]. This information is not intended to replace advice given to you by your health care provider. Make sure you discuss any questions you have with your health care provider. ° °

## 2014-03-18 NOTE — H&P (View-Only) (Signed)
Electrophysiology Office Note   Date:  03/16/2014   ID:  Benjamin Cook, DOB 1945-08-04, MRN [id number]  PCP:  Renato Shin, MD  Cardiologist:  Dr [name] Primary Electrophysiologist: Thompson Grayer, MD    Chief Complaint  Patient presents with  . Shortness of Breath     History of Present Illness: Benjamin Cook is a 69 y.o. male who presents today for electrophysiology evaluation.   He returns today to evaluate his ICD.  His ICD recently has stopped working.  With research visit earlier this week, industry could not interrogate his device.  Fortunately, he is not device dependant.  He is doing well.  He remains active.  He still smokes cigars and is not ready to quit. Today, he denies symptoms of palpitations, chest pain, shortness of breath, orthopnea, PND, lower extremity edema, claudication, dizziness, presyncope, syncope, bleeding, or neurologic sequela. The patient is tolerating medications without difficulties and is otherwise without complaint today.    Past Medical History  Diagnosis Date  . CAD (coronary artery disease)   . Cardiomyopathy, ischemic   . CHF (congestive heart failure)     class II/III  . HTN (hypertension)   . Hyperlipidemia   . COPD (chronic obstructive pulmonary disease)     smokes cigars but has quit cigarettes  . PSA (psoriatic arthritis)     increased  . History of hematuria   . Adrenal mass     per pt this is remote (10 years) and benign by biopsy  . Anxiety   . PVD (peripheral vascular disease)   . Hx of colonoscopy    Past Surgical History  Procedure Laterality Date  . Coronary stent placement    . Cataract surgery    . Cardiac defibrillator placement  05/25/10    [employer] fortified ST DR ICD implanted by [facility] for primary prevention  of sudden death     Current Outpatient Prescriptions  Medication Sig Dispense Refill  . aspirin EC 81 MG EC tablet Take 1 tablet (81 mg total) by mouth daily. 30 tablet   . atorvastatin  (LIPITOR) 80 MG tablet TAKE 1 TABLET (80 MG TOTAL) BY MOUTH DAILY. 90 tablet 1  . carvedilol (COREG) 25 MG tablet TAKE 1 TABLET (25 MG TOTAL) BY MOUTH 2 (TWO) TIMES DAILY WITH A MEAL. 180 tablet 2  . Fish Oil OIL Take 360 mg by mouth daily.    . furosemide (LASIX) 40 MG tablet TAKE 1 TABLET (40 MG TOTAL) BY MOUTH DAILY. 90 tablet 2  . KLOR-CON M20 20 MEQ tablet TAKE 1 TABLET BY MOUTH DAILY. 90 tablet 0  . lisinopril (PRINIVIL,ZESTRIL) 20 MG tablet TAKE 1 TABLET (20 MG TOTAL) BY MOUTH DAILY. 90 tablet 1  . Multiple Vitamin (MULTIVITAMIN) capsule Take 1 capsule by mouth daily.      . nitroGLYCERIN (NITROSTAT) 0.4 MG SL tablet One tablet under tongue every 5 minutes as needed for chest pain--may repeat 3 times      No current facility-administered medications for this visit.    Allergies:   Review of patient's allergies indicates no known allergies.   Social History:  The patient  reports that he has been smoking Cigars.  He has never used smokeless tobacco. He reports that he does not drink alcohol or use illicit drugs.   Family History:  The patient's family history includes Cirrhosis in his mother. There is no history of Cancer.    ROS:  Please see the history of present illness.  All other systems are reviewed and negative.    PHYSICAL EXAM: VS:  BP 132/84 mmHg  Pulse 58  Ht 5\' 11"  (1.803 m)  Wt 145 lb (65.772 kg)  BMI 20.23 kg/m2 , BMI Body mass index is 20.23 kg/(m^2). GEN: Well nourished, well developed, in no acute distress HEENT: normal Neck: no JVD, carotid bruits, or masses Cardiac: RRR; no murmurs, rubs, or gallops,no edema  Respiratory:  clear to auscultation bilaterally, normal work of breathing GI: soft, nontender, nondistended, + BS MS: no deformity or atrophy Skin: warm and dry, device pocket is well healed Neuro:  Strength and sensation are intact Psych: euthymic mood, full affect  Echo 2010 reveals EF 30%  Device interrogation is attempted today and we are  still unable to interrogated this device.   Recent Labs: No results found for requested labs within last 365 days.    Lipid Panel     Component Value Date/Time   CHOL 115 04/15/2012 1041   TRIG 46.0 04/15/2012 1041   HDL 40.80 04/15/2012 1041   CHOLHDL 3 04/15/2012 1041   VLDL 9.2 04/15/2012 1041   LDLCALC 65 04/15/2012 1041     Wt Readings from Last 3 Encounters:  03/16/14 145 lb (65.772 kg)  12/21/12 138 lb (62.596 kg)  12/01/12 138 lb 12.8 oz (62.959 kg)     ASSESSMENT AND PLAN:  1.  Ischemic CM/ chronic systolic dysfunction No ischemic symptoms euvolemic today His ICD has malfunctioned (I suspect a battery issue).  [facility] is aware and advised generator replacement (per my phone discussion with Karilyn Cota). Risks, benefits, and alternatives to ICD pulse generator replacement were discussed in detail today.  The patient understands that risks include but are not limited to bleeding, infection, pneumothorax, perforation, tamponade, vascular damage, renal failure, MI, stroke, death, inappropriate shocks, damage to his existing leads, and lead dislodgement and wishes to proceed.  We will therefore schedule the procedure at the next available time. A high level of decision making is required for evaluation and management of his malfunctioning ICD.  I will replace the device this week as he is at risk of further complications with the malfunctioning device.  2. HTN Stable No change required today  3. CAD No ischemic symptoms  4. Tobacco Cessation is advised He has quit cigarettes but does not wish to quite cigars   Current medicines are reviewed at length with the patient today.   The patient does not have concerns regarding his medicines.  The following changes were made today:  none  Signed, Thompson Grayer, MD  03/16/2014 10:54 AM     [facility] [address] [place] [address] [phone] (office) [phone] (fax)

## 2014-03-18 NOTE — Interval H&P Note (Signed)
History and Physical Interval Note:  03/18/2014 9:54 AM  Benjamin Cook  has presented today for surgery, with the diagnosis of eol battery status/ ICD malfunction.  The various methods of treatment have been discussed with the patient and family. After consideration of risks, benefits and other options for treatment, the patient has consented to  Procedure(s): ICD GENERATOR CHANGE (N/A) as a surgical intervention .  The patient's history has been reviewed, patient examined, no change in status, stable for surgery.  I have reviewed the patient's chart and labs.  Questions were answered to the patient's satisfaction.  We also discussed whether or not he would like to remain in the Analyze ST study or proceed with a traditional ICD.  He is clear that he would prefer a traditional ICD and does not wish for an ST device.   Thompson Grayer

## 2014-03-18 NOTE — Interval H&P Note (Signed)
History and Physical Interval Note:  03/18/2014 9:56 AM  Benjamin Cook  has presented today for surgery, with the diagnosis of eol  The various methods of treatment have been discussed with the patient and family. After consideration of risks, benefits and other options for treatment, the patient has consented to  Procedure(s): ICD GENERATOR CHANGE (N/A) as a surgical intervention .  The patient's history has been reviewed, patient examined, no change in status, stable for surgery.  I have reviewed the patient's chart and labs.  Questions were answered to the patient's satisfaction.     ICD Criteria  Current LVEF: 30% ;Obtained > 6 months ago.   NYHA Functional Classification: Class II  Heart Failure History:  No.  Non-Ischemic Dilated Cardiomyopathy History:  No.  Atrial Fibrillation/Atrial Flutter:  No.  Ventricular Tachycardia History:  No.  Cardiac Arrest History:  No  History of Syndromes with Risk of Sudden Death:  No.  Previous ICD:  Yes, ICD Type:  Dual, Reason for ICD:  Primary prevention.  30%  Electrophysiology Study: No.  Prior MI: Yes, Most recent MI timeframe is > 40 days.  PPM: No.  OSA:  No  Patient Life Expectancy of >=1 year: Yes.  Anticoagulation Therapy:  Patient is NOT on anticoagulation therapy.   Beta Blocker Therapy:  Yes.   Ace Inhibitor/ARB Therapy:  Yes.    Thompson Grayer

## 2014-03-18 NOTE — Op Note (Signed)
SURGEON:  Thompson Grayer, MD      PREPROCEDURE DIAGNOSES:   1. Ischemic cardiomyopathy.   2. New York Heart Association class II, heart failure chronically.   3. CAD  4. ICD malfunction     POSTPROCEDURE DIAGNOSES:   1. Ischemic cardiomyopathy.   2. New York Heart Association class II, heart failure chronically.   3. CAD  4. ICD malfunction   PROCEDURES:    1. ICD pulse generator replacement  2. Skin pocket revision     INTRODUCTION:  Benjamin Cook is a 69 y.o. male with an ischemic CM (EF 30%), NYHA Class II CHF, and CAD s/p ICD implant with recent ICD pulse generator failure who presents today for ICD pulse generator replacement.  The patient presented 03/14/14 for routine device clinic visit.  At that time, the device could not be interrogated.  Multiple attempts were made with 3 separate programmers with and without antennae and also with remote transmission from home.  All attempts were unsuccessful.  The patient did not receive a vibratory alert.   [facility] [employer] [facility] was notified and telemetry test was performed 10 times with 0 successes.  Upon further discussion with [facility] it was felt that device replacement was indicated.  The patient therefore presents today for ICD pulse generator replacement.      DESCRIPTION OF PROCEDURE:  Informed written consent was obtained and the patient was brought to the electrophysiology lab in the fasting state.   The patient was adequately sedated with intravenous Versed, and fentanyl as outlined in the nursing report.  The patient's left chest was prepped and draped in the usual sterile fashion by the EP lab staff.  The skin overlying the left deltopectoral region was infiltrated with lidocaine for local analgesia.  A 5-cm incision was made over the existing ICD pocket.  Electrocautery was used to assure hemostasis.  The device was exposed and removed from the pocket.   The device was disconnected from the leads.  The leads were examined thoroughly and their  integrity confirmed to be intact.  The anterior surface of the device can was mildly protruding and suggestive of a possible internal failure.  The explanted device is a [employer] Fortify ST DR model [id number] ([id number]) implanted previousoly 05/25/2010.  The right atrial lead was confirmed to be a [employer] [id number] (serial # [id number]) lead implanted 05/25/2010.  The right ventricular lead was confirmed to be a [place], model 7122Q-65 (serial number [id number]) right ventricular defibrillator lead also implanted 05/25/2010. Atrial lead P-waves measured 1.4 mV with an impedance of 453 ohms and a threshold of 0.4 volts at 0.5 milliseconds.  The right ventricular lead R-wave measured 16.4 mV with impedance of 386 ohms and a threshold of 0.8 volts at 0.5 milliseconds.  Both leads were then connected to a [place] [phone]). The pocket was revised to accomodate this new device.  The pocket was  irrigated with copious gentamicin solution.  The pocket was then closed in 2 layers with 2.0 Vicryl suture for the subcutaneous and subcuticular layers. EBL<82ml.  Steri-Strips and a  sterile dressing were then applied.   There were no early apparent complications.     CONCLUSIONS:   1. Ischemic cardiomyopathy with chronic New York Heart Association class III heart failure, and CAD  2. ICD malfunction with inability to communicate with/ interrogation the device  3. Successful ICD pulse generator replacement   4. No early apparent  complications.    Upon discussion with [facility], there will likely be warranty with this device failure.  I have also instructed [facility] to have this device evaluated by their headquarters.   Thompson Grayer MD 03/18/2014 11:09 AM

## 2014-03-21 ENCOUNTER — Other Ambulatory Visit: Payer: Self-pay

## 2014-03-21 MED ORDER — LISINOPRIL 20 MG PO TABS: ORAL_TABLET | ORAL | Status: DC

## 2014-03-28 ENCOUNTER — Ambulatory Visit (INDEPENDENT_AMBULATORY_CARE_PROVIDER_SITE_OTHER): Payer: Medicare Other | Admitting: *Deleted

## 2014-03-28 DIAGNOSIS — I255 Ischemic cardiomyopathy: Secondary | ICD-10-CM | POA: Diagnosis not present

## 2014-03-28 LAB — MDC_IDC_ENUM_SESS_TYPE_INCLINIC
Brady Statistic RA Percent Paced: 0.01 %
Brady Statistic RV Percent Paced: 0.01 %
HighPow Impedance: 72 Ohm
Implantable Pulse Generator Serial Number: 7225079
Lead Channel Impedance Value: 437.5 Ohm
Lead Channel Pacing Threshold Amplitude: 0.5 V
Lead Channel Pacing Threshold Amplitude: 1 V
Lead Channel Pacing Threshold Pulse Width: 0.5 ms
Lead Channel Pacing Threshold Pulse Width: 0.5 ms
Lead Channel Setting Pacing Amplitude: 2.5 V
MDC IDC MSMT BATTERY REMAINING LONGEVITY: 105.6 mo
MDC IDC MSMT LEADCHNL RA PACING THRESHOLD AMPLITUDE: 0.5 V
MDC IDC MSMT LEADCHNL RA PACING THRESHOLD PULSEWIDTH: 0.5 ms
MDC IDC MSMT LEADCHNL RA SENSING INTR AMPL: 1.9 mV
MDC IDC MSMT LEADCHNL RV IMPEDANCE VALUE: 375 Ohm
MDC IDC MSMT LEADCHNL RV PACING THRESHOLD AMPLITUDE: 1 V
MDC IDC MSMT LEADCHNL RV PACING THRESHOLD PULSEWIDTH: 0.5 ms
MDC IDC MSMT LEADCHNL RV SENSING INTR AMPL: 11.6 mV
MDC IDC SESS DTM: 20160222101540
MDC IDC SET LEADCHNL RA PACING AMPLITUDE: 2 V
MDC IDC SET LEADCHNL RV PACING PULSEWIDTH: 0.5 ms
MDC IDC SET LEADCHNL RV SENSING SENSITIVITY: 0.5 mV
Zone Setting Detection Interval: 240 ms
Zone Setting Detection Interval: 320 ms

## 2014-03-28 NOTE — Progress Notes (Signed)
Wound check appointment. Steri-strips removed. Wound without redness or edema. Incision edges approximated, wound well healed. Normal device function. Thresholds, sensing, and impedances consistent with implant measurements. Device programmed at chronic lead settings. Histogram distribution appropriate for patient and level of activity. No mode switches or ventricular arrhythmias noted. Patient educated about wound care, arm mobility, lifting restrictions, shock plan. ROV w/ Dr. Rayann Heman 06/27/14.

## 2014-04-07 ENCOUNTER — Encounter: Payer: Self-pay | Admitting: Internal Medicine

## 2014-04-29 ENCOUNTER — Other Ambulatory Visit: Payer: Self-pay | Admitting: Cardiology

## 2014-05-12 ENCOUNTER — Encounter: Payer: Self-pay | Admitting: Internal Medicine

## 2014-05-17 ENCOUNTER — Other Ambulatory Visit: Payer: Self-pay | Admitting: Cardiology

## 2014-05-20 ENCOUNTER — Telehealth: Payer: Self-pay | Admitting: *Deleted

## 2014-05-20 NOTE — Telephone Encounter (Signed)
Routing to [employer].
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
## 2014-05-20 NOTE — Telephone Encounter (Signed)
Please schedule follow-up with EP NP next week. If any symptoms related to head injury, should report to ER.

## 2014-05-25 ENCOUNTER — Encounter: Payer: Self-pay | Admitting: Internal Medicine

## 2014-05-25 ENCOUNTER — Encounter: Payer: Self-pay | Admitting: Nurse Practitioner

## 2014-05-25 ENCOUNTER — Ambulatory Visit (INDEPENDENT_AMBULATORY_CARE_PROVIDER_SITE_OTHER): Payer: Medicare Other | Admitting: Nurse Practitioner

## 2014-05-25 ENCOUNTER — Other Ambulatory Visit: Payer: Self-pay | Admitting: Nurse Practitioner

## 2014-05-25 VITALS — BP 110/70 | HR 69 | Ht 71.0 in | Wt 139.6 lb

## 2014-05-25 DIAGNOSIS — I472 Ventricular tachycardia, unspecified: Secondary | ICD-10-CM

## 2014-05-25 DIAGNOSIS — I5022 Chronic systolic (congestive) heart failure: Secondary | ICD-10-CM | POA: Diagnosis not present

## 2014-05-25 DIAGNOSIS — Z01812 Encounter for preprocedural laboratory examination: Secondary | ICD-10-CM

## 2014-05-25 LAB — CBC WITH DIFFERENTIAL/PLATELET
Basophils Absolute: 0 10*3/uL (ref 0.0–0.1)
Basophils Relative: 0.4 % (ref 0.0–3.0)
EOS ABS: 0.3 10*3/uL (ref 0.0–0.7)
EOS PCT: 3 % (ref 0.0–5.0)
HEMATOCRIT: 44.3 % (ref 39.0–52.0)
HEMOGLOBIN: 15.2 g/dL (ref 13.0–17.0)
LYMPHS ABS: 2.3 10*3/uL (ref 0.7–4.0)
Lymphocytes Relative: 26.5 % (ref 12.0–46.0)
MCHC: 34.3 g/dL (ref 30.0–36.0)
MCV: 89.9 fl (ref 78.0–100.0)
Monocytes Absolute: 0.9 10*3/uL (ref 0.1–1.0)
Monocytes Relative: 10.4 % (ref 3.0–12.0)
NEUTROS ABS: 5.2 10*3/uL (ref 1.4–7.7)
Neutrophils Relative %: 59.7 % (ref 43.0–77.0)
PLATELETS: 198 10*3/uL (ref 150.0–400.0)
RBC: 4.93 Mil/uL (ref 4.22–5.81)
RDW: 15.5 % (ref 11.5–15.5)
WBC: 8.8 10*3/uL (ref 4.0–10.5)

## 2014-05-25 LAB — BASIC METABOLIC PANEL
BUN: 12 mg/dL (ref 6–23)
CALCIUM: 9.7 mg/dL (ref 8.4–10.5)
CO2: 34 mEq/L — ABNORMAL HIGH (ref 19–32)
Chloride: 102 mEq/L (ref 96–112)
Creatinine, Ser: 0.87 mg/dL (ref 0.40–1.50)
GFR: 111.32 mL/min (ref >60.00)
Glucose, Bld: 85 mg/dL (ref 70–99)
POTASSIUM: 3.7 meq/L (ref 3.5–5.1)
Sodium: 138 mEq/L (ref 135–145)

## 2014-05-25 LAB — PROTIME-INR
INR: 1.1 ratio — ABNORMAL HIGH (ref 0.8–1.0)
Prothrombin Time: 12.1 s (ref 9.6–13.1)

## 2014-05-25 LAB — MAGNESIUM: MAGNESIUM: 1.9 mg/dL (ref 1.5–2.5)

## 2014-05-25 LAB — APTT: APTT: 30.4 s (ref 23.4–32.7)

## 2014-05-25 NOTE — Progress Notes (Signed)
Electrophysiology Office Note Date: 05/25/2014  ID:  BRANNEN KOPPEN, DOB 05-Aug-1945, MRN [id number]  PCP: Renato Shin, MD Primary Cardiologist: [name] Electrophysiologist: Allred  CC: Follow up for recent ICD shock  Benjamin Cook is a 69 y.o. male is seen today for Dr Rayann Heman.  He presents today for follow up after recent ICD shock for VT.  He was working outside, developed dizziness, and lost consciousness.  He fell backwards and hit his head.  He sent a remote transmission which demonstrated VT at 251msec in the VF zone that failed to terminate with ATP but terminated with HV shock. This is his first appropriate therapy.  He has a history of CAD with LAD stent placed in 2001.  Myoview in 12/2012 was high risk with large scar in LAD and RCA territory but no ischemia.  He did not have chest pain prior to stent placement in 2001.  He reports compliance with his medications. He denies chest pain, palpitations, dyspnea, PND, orthopnea, nausea, vomiting, dizziness, edema, weight gain, or early satiety.   Device History: STJ dual chamber ICD implanted 2012 for primary prevention as part of Analyze-ST study.  In February of this year, his device was not able to be communicated with and he underwent generator change 03/18/14 History of appropriate therapy: Yes - 05/19/14 History of AAD therapy: No   Past Medical History  Diagnosis Date  . CAD (coronary artery disease)     a. s/p PCI to LAD 2001 b. myoview 2014 high risk with scar LAD/RCA territory but no ischemia  . Cardiomyopathy, ischemic   . CHF (congestive heart failure)     class II/III  . HTN (hypertension)   . Hyperlipidemia   . COPD (chronic obstructive pulmonary disease)     smokes cigars but has quit cigarettes  . PSA (psoriatic arthritis)     increased  . Adrenal mass     per pt this is remote (10 years) and benign by biopsy  . Anxiety   . PVD (peripheral vascular disease)    Past Surgical History  Procedure  Laterality Date  . Coronary stent placement      a. PCI to LAD 2001  . Cataract surgery    . Implantable cardioverter defibrillator (icd) generator change N/A 03/18/2014    a. SJM Fortify ST DR ICD implanted by [facility] for primary prevention b. gen change 03/2014     Current Outpatient Prescriptions  Medication Sig Dispense Refill  . aspirin EC 81 MG EC tablet Take 1 tablet (81 mg total) by mouth daily. 30 tablet   . atorvastatin (LIPITOR) 80 MG tablet TAKE 1 TABLET (80 MG TOTAL) BY MOUTH DAILY. 90 tablet 1  . carvedilol (COREG) 25 MG tablet TAKE 1 TABLET (25 MG TOTAL) BY MOUTH 2 (TWO) TIMES DAILY WITH A MEAL. 180 tablet 2  . Fish Oil OIL Take 360 mg by mouth daily.    . furosemide (LASIX) 40 MG tablet TAKE 1 TABLET (40 MG TOTAL) BY MOUTH DAILY. 90 tablet 2  . KLOR-CON M20 20 MEQ tablet TAKE 1 TABLET BY MOUTH DAILY. 30 tablet 1  . lisinopril (PRINIVIL,ZESTRIL) 20 MG tablet TAKE 1 TABLET (20 MG TOTAL) BY MOUTH DAILY. 90 tablet 1  . Multiple Vitamin (MULTIVITAMIN) capsule Take 1 capsule by mouth daily.      . nitroGLYCERIN (NITROSTAT) 0.4 MG SL tablet One tablet under tongue every 5 minutes as needed for chest pain--may repeat 3 times      No current  facility-administered medications for this visit.    Allergies:   Review of patient's allergies indicates no known allergies.   Social History: History   Social History  . Marital Status: Married    Spouse Name: N/A  . Number of Children: N/A  . Years of Education: N/A   Occupational History  . Lawn care    Social History Main Topics  . Smoking status: Current Every Day Smoker    Types: Cigars  . Smokeless tobacco: Never Used     Comment: smokes cigars now, no longer smokes cigarettes but previously smoked 2ppd  . Alcohol Use: No  . Drug Use: No  . Sexual Activity: Not on file   Other Topics Concern  . Not on file   Social History Narrative    Family History: Family History  Problem Relation Age of Onset  . Cancer Neg Hx     . Cirrhosis Mother     due to ETOH    Review of Systems: General: No chills, fever, night sweats or weight changes  Cardiovascular:  No chest pain, dyspnea on exertion, edema, orthopnea, palpitations, paroxysmal nocturnal dyspnea  Dermatological: No rash, lesions or masses Respiratory: No cough, dyspnea Urologic: No hematuria, dysuria Abdominal: No nausea, vomiting, diarrhea, bright red blood per rectum, melena, or hematemesis Neurologic: No visual changes, weakness, changes in mental status All other systems reviewed and are otherwise negative except as noted above.   Physical Exam: VS:  BP 110/70 mmHg  Pulse 69  Ht 5\' 11"  (1.803 m)  Wt 139 lb 9.6 oz (63.322 kg)  BMI 19.48 kg/m2 , BMI Body mass index is 19.48 kg/(m^2).  GEN- The patient is well appearing, alert and oriented x 3 today.   HEENT: normocephalic, atraumatic; sclera clear, conjunctiva pink; hearing intact; oropharynx clear; neck supple, no JVP Lymph- no cervical lymphadenopathy Lungs- Scattered rhonchi, normal work of breathing  Heart- Regular rate and rhythm, no murmurs, rubs or gallops  GI- soft, non-tender, non-distended, bowel sounds present  Extremities- no clubbing, cyanosis, or edema; DP/PT/radial pulses 2+ bilaterally MS- no significant deformity or atrophy Skin- warm and dry, no rash or lesion; ICD pocket well healed Psych- euthymic mood, full affect Neuro- strength and sensation are intact  ICD interrogation- reviewed in detail today,  See PACEART report  EKG:  EKG is ordered today. The ekg ordered today shows sinus rhythm, LVH, inferior lateral T wave inversions, poor R wave progression  Recent Labs: 03/16/2014: BUN 12; Creatinine 0.85; Hemoglobin 15.4; Platelets 197.0; Potassium 4.0; Sodium 137   Wt Readings from Last 3 Encounters:  05/25/14 139 lb 9.6 oz (63.322 kg)  03/18/14 145 lb (65.772 kg)  03/16/14 145 lb (65.772 kg)     Other studies Reviewed: Additional studies/ records that were  reviewed today include: prior catheterization, prior myoview  Assessment and Plan:  1.  Ventricular tachycardia This is the patient's first appropriate ICD therapy Will check BMET, Mg, CBC today D/w Dr Rayann Heman, with known underlying CAD and previously abnormal myoview, will proceed with catheterization.  Risks, benefits reviewed with the patient today No driving x6 months (pt aware)  2.  Chronic systolic dysfunction euvolemic today Stable on an appropriate medical regimen Normal ICD function See Pace Art report No changes today  3.  HTN Stable No change required today  4.  Tobacco use Cessation advised, he is willing to attempt to try quitting cigars    Current medicines are reviewed at length with the patient today.   The patient does  not have concerns regarding his medicines.  The following changes were made today:  none  Labs/ tests ordered today include:  Orders Placed This Encounter  Procedures  . CBC w/Diff  . Basic Metabolic Panel (BMET)  . Magnesium  . PTT  . INR/PT  . EKG 12-Lead    Disposition:   Follow up with me in 3 weeks.  Will need to schedule follow up with Dr [name] after catheterization.     Signed, Chanetta Marshall, NP 05/25/2014 4:54 PM  [place] [address] [phone] (office) [phone] (fax)

## 2014-05-25 NOTE — Patient Instructions (Signed)
Medication Instructions:  Your physician recommends that you continue on your current medications as directed. Please refer to the Current Medication list given to you today.   Labwork: TODAY (CBC, BMET, Mag, PT, INR)  Testing/Procedures: Your physician has requested that you have a cardiac catheterization on Friday, April 22. Cardiac catheterization is used to diagnose and/or treat various heart conditions. Doctors may recommend this procedure for a number of different reasons. The most common reason is to evaluate chest pain. Chest pain can be a symptom of coronary artery disease (CAD), and cardiac catheterization can show whether plaque is narrowing or blocking your heart's arteries. This procedure is also used to evaluate the valves, as well as measure the blood flow and oxygen levels in different parts of your heart. For further information please visit [email or web address]. Please follow instruction sheet, as given.  Follow-Up: Your physician recommends that you schedule a follow-up appointment in 2-3 weeks with Chanetta Marshall.

## 2014-05-27 ENCOUNTER — Ambulatory Visit (HOSPITAL_COMMUNITY)
Admission: RE | Admit: 2014-05-27 | Discharge: 2014-05-27 | Disposition: A | Discharge status: Home / Self Care | Payer: Medicare Other | Source: Ambulatory Visit | Attending: Cardiovascular Disease | Admitting: Cardiovascular Disease

## 2014-05-27 ENCOUNTER — Encounter (HOSPITAL_COMMUNITY): Payer: Self-pay | Admitting: Cardiovascular Disease

## 2014-05-27 ENCOUNTER — Encounter (HOSPITAL_COMMUNITY)
Admission: RE | Disposition: A | Discharge status: Home / Self Care | Payer: Self-pay | Source: Ambulatory Visit | Attending: Cardiovascular Disease

## 2014-05-27 DIAGNOSIS — L4050 Arthropathic psoriasis, unspecified: Secondary | ICD-10-CM | POA: Diagnosis not present

## 2014-05-27 DIAGNOSIS — I472 Ventricular tachycardia, unspecified: Secondary | ICD-10-CM

## 2014-05-27 DIAGNOSIS — I509 Heart failure, unspecified: Secondary | ICD-10-CM | POA: Insufficient documentation

## 2014-05-27 DIAGNOSIS — I10 Essential (primary) hypertension: Secondary | ICD-10-CM | POA: Diagnosis not present

## 2014-05-27 DIAGNOSIS — E785 Hyperlipidemia, unspecified: Secondary | ICD-10-CM | POA: Diagnosis not present

## 2014-05-27 DIAGNOSIS — Z87891 Personal history of nicotine dependence: Secondary | ICD-10-CM | POA: Insufficient documentation

## 2014-05-27 DIAGNOSIS — I5189 Other ill-defined heart diseases: Secondary | ICD-10-CM | POA: Insufficient documentation

## 2014-05-27 DIAGNOSIS — I2510 Atherosclerotic heart disease of native coronary artery without angina pectoris: Secondary | ICD-10-CM | POA: Diagnosis not present

## 2014-05-27 DIAGNOSIS — I255 Ischemic cardiomyopathy: Secondary | ICD-10-CM | POA: Diagnosis not present

## 2014-05-27 DIAGNOSIS — I2584 Coronary atherosclerosis due to calcified coronary lesion: Secondary | ICD-10-CM | POA: Diagnosis not present

## 2014-05-27 DIAGNOSIS — Z7982 Long term (current) use of aspirin: Secondary | ICD-10-CM | POA: Insufficient documentation

## 2014-05-27 DIAGNOSIS — Z955 Presence of coronary angioplasty implant and graft: Secondary | ICD-10-CM | POA: Diagnosis not present

## 2014-05-27 DIAGNOSIS — I739 Peripheral vascular disease, unspecified: Secondary | ICD-10-CM | POA: Diagnosis not present

## 2014-05-27 DIAGNOSIS — J449 Chronic obstructive pulmonary disease, unspecified: Secondary | ICD-10-CM | POA: Insufficient documentation

## 2014-05-27 DIAGNOSIS — F419 Anxiety disorder, unspecified: Secondary | ICD-10-CM | POA: Insufficient documentation

## 2014-05-27 HISTORY — PX: LEFT HEART CATHETERIZATION WITH CORONARY ANGIOGRAM: SHX5451

## 2014-05-27 SURGERY — LEFT HEART CATHETERIZATION WITH CORONARY ANGIOGRAM
Anesthesia: LOCAL

## 2014-05-27 MED ORDER — VERAPAMIL HCL 2.5 MG/ML IV SOLN
INTRAVENOUS | Status: AC
  Filled 2014-05-27: qty 2

## 2014-05-27 MED ORDER — HEPARIN (PORCINE) IN NACL 2-0.9 UNIT/ML-% IJ SOLN
INTRAMUSCULAR | Status: AC
  Filled 2014-05-27: qty 1000

## 2014-05-27 MED ORDER — ASPIRIN 81 MG PO CHEW
CHEWABLE_TABLET | ORAL | Status: AC
  Administered 2014-05-27: 81 mg via ORAL
  Filled 2014-05-27: qty 1

## 2014-05-27 MED ORDER — SODIUM CHLORIDE 0.9 % IJ SOLN: 3.0000 mL | Freq: Two times a day (BID) | INTRAMUSCULAR | Status: DC

## 2014-05-27 MED ORDER — MIDAZOLAM HCL 2 MG/2ML IJ SOLN
INTRAMUSCULAR | Status: AC
  Filled 2014-05-27: qty 2

## 2014-05-27 MED ORDER — NITROGLYCERIN 1 MG/10 ML FOR IR/CATH LAB
INTRA_ARTERIAL | Status: AC
  Filled 2014-05-27: qty 10

## 2014-05-27 MED ORDER — FENTANYL CITRATE (PF) 100 MCG/2ML IJ SOLN
INTRAMUSCULAR | Status: AC
  Filled 2014-05-27: qty 2

## 2014-05-27 MED ORDER — ASPIRIN 81 MG PO CHEW
81.0000 mg | CHEWABLE_TABLET | ORAL | Status: AC
  Administered 2014-05-27: 81 mg via ORAL

## 2014-05-27 MED ORDER — SODIUM CHLORIDE 0.9 % IJ SOLN: 3.0000 mL | INTRAMUSCULAR | Status: DC | PRN

## 2014-05-27 MED ORDER — ONDANSETRON HCL 4 MG/2ML IJ SOLN: 4.0000 mg | Freq: Four times a day (QID) | INTRAMUSCULAR | Status: DC | PRN

## 2014-05-27 MED ORDER — LABETALOL HCL 5 MG/ML IV SOLN
20.0000 mg | Freq: Once | INTRAVENOUS | Status: AC
  Administered 2014-05-27: 20 mg via INTRAVENOUS

## 2014-05-27 MED ORDER — ACETAMINOPHEN 325 MG PO TABS: 650.0000 mg | ORAL_TABLET | ORAL | Status: DC | PRN

## 2014-05-27 MED ORDER — HEPARIN SODIUM (PORCINE) 1000 UNIT/ML IJ SOLN
INTRAMUSCULAR | Status: AC
  Filled 2014-05-27: qty 1

## 2014-05-27 MED ORDER — HYDRALAZINE HCL 20 MG/ML IJ SOLN: 10.0000 mg | Freq: Once | INTRAMUSCULAR | Status: DC

## 2014-05-27 MED ORDER — HYDRALAZINE HCL 20 MG/ML IJ SOLN
10.0000 mg | Freq: Once | INTRAMUSCULAR | Status: AC
  Administered 2014-05-27: 16:00:00 via INTRAVENOUS

## 2014-05-27 MED ORDER — HYDRALAZINE HCL 20 MG/ML IJ SOLN
INTRAMUSCULAR | Status: AC
  Filled 2014-05-27: qty 1

## 2014-05-27 MED ORDER — SODIUM CHLORIDE 0.9 % IV SOLN
INTRAVENOUS | Status: DC
  Administered 2014-05-27: 1000 mL via INTRAVENOUS

## 2014-05-27 MED ORDER — LABETALOL HCL 5 MG/ML IV SOLN
INTRAVENOUS | Status: AC
  Filled 2014-05-27: qty 4

## 2014-05-27 MED ORDER — SODIUM CHLORIDE 0.9 % IV SOLN
1.0000 mL/kg/h | INTRAVENOUS | Status: DC
  Administered 2014-05-27: 1 mL/kg/h via INTRAVENOUS

## 2014-05-27 MED ORDER — SODIUM CHLORIDE 0.9 % IV SOLN: 250.0000 mL | INTRAVENOUS | Status: DC | PRN

## 2014-05-27 MED ORDER — LIDOCAINE HCL (PF) 1 % IJ SOLN
INTRAMUSCULAR | Status: AC
  Filled 2014-05-27: qty 30

## 2014-05-27 NOTE — CV Procedure (Signed)
    Cardiac Catheterization Procedure Note  Name: Benjamin Cook MRN: [id number] DOB: 1945-08-16  Procedure: Left Heart Cath, Selective Coronary Angiography, LV angiography  Indication: Ventricular tachycardia/ICD shock. This 69 year old gentleman with history of anterior infarct and remote PCI of the distal LAD in 2001 had an appropriate shock for ventricular tachycardia. He was referred for cardiac catheterization to evaluate for an ischemic etiology of his VT.   Procedural Details: The right wrist was prepped, draped, and anesthetized with 1% lidocaine. Using the modified Seldinger technique, a 5/6 French Slender sheath was introduced into the right radial artery. 3 mg of verapamil was administered through the sheath, weight-based unfractionated heparin was administered intravenously. Standard Judkins catheters were used for selective coronary angiography and left ventriculography. Catheter exchanges were performed over an exchange length guidewire. There were no immediate procedural complications. A TR band was used for radial hemostasis at the completion of the procedure.  The patient was transferred to the post catheterization recovery area for further monitoring.  Procedural Findings: Hemodynamics: AO 165/63 LV 167/7  Coronary angiography: Coronary dominance: right  Left mainstem: Moderately calcified vessel, widely patent in the proximal and midportion. There is mild 30% tapering of the distal left main.  Left anterior descending (LAD): This vessel wraps around the left ventricular apex. The proximal LAD has diffuse irregularity. The ostium is 30% stenosis. The mid LAD at the origin of the first diagonal has 40% stenosis before a stented segment in the mid vessel. The stented segment is widely patent. The distal LAD is patent with diffuse 20-30% stenosis. The diagonal branch is patent with 30-40% stenosis in its midportion.  Left circumflex (LCx): The circumflex is patent. The  proximal circumflex has diffuse irregularity. The angle of the circumflex off of the left main is acute. There is 40% mid circumflex stenosis and into the first OM branch.  Right coronary artery (RCA): The RCA is mildly calcified. The proximal RCA is patent. The mid RCA has diffuse heavy irregularity with 50% stenosis but no evidence of high-grade obstruction. The PDA and PLA branches are patent.  Left ventriculography: There is severe segmental LV systolic dysfunction. The apex and inferoapex is akinetic. The basal and midinferior walls are hypokinetic. The anterior wall contracts normally at the base and mid portions. The LVEF is estimated at 35%.  Estimated Blood Loss: Minimal  Final Conclusions:   1. Diffuse nonobstructive CAD with continued stent patency in the mid LAD 2. Severe segmental LV systolic dysfunction 3. Normal LVEDP  Recommendations: Overall the patient's hemodynamics appear well compensated. He does not have evidence of severe obstructive disease at this point. Would continue with medical therapy. His left ventriculogram is suggestive of a large scar and suspect that he had scar-mediated ventricular tachycardia.  Sherren Mocha MD, [facility] 05/27/2014, 2:43 PM

## 2014-05-27 NOTE — Discharge Instructions (Signed)
Radial Site Care °Refer to this sheet in the next few weeks. These instructions provide you with information on caring for yourself after your procedure. Your caregiver may also give you more specific instructions. Your treatment has been planned according to current medical practices, but problems sometimes occur. Call your caregiver if you have any problems or questions after your procedure. °HOME CARE INSTRUCTIONS °· You may shower the day after the procedure. Remove the bandage (dressing) and gently wash the site with plain soap and water. Gently pat the site dry. °· Do not apply powder or lotion to the site. °· Do not submerge the affected site in water for 3 to 5 days. °· Inspect the site at least twice daily. °· Do not flex or bend the affected arm for 24 hours. °· No lifting over 5 pounds (2.3 kg) for 5 days after your procedure. °· Do not drive home if you are discharged the same day of the procedure. Have someone else drive you. °· You may drive 24 hours after the procedure unless otherwise instructed by your caregiver. °· Do not operate machinery or power tools for 24 hours. °· A responsible adult should be with you for the first 24 hours after you arrive home. °What to expect: °· Any bruising will usually fade within 1 to 2 weeks. °· Blood that collects in the tissue (hematoma) may be painful to the touch. It should usually decrease in size and tenderness within 1 to 2 weeks. °SEEK IMMEDIATE MEDICAL CARE IF: °· You have unusual pain at the radial site. °· You have redness, warmth, swelling, or pain at the radial site. °· You have drainage (other than a small amount of blood on the dressing). °· You have chills. °· You have a fever or persistent symptoms for more than 72 hours. °· You have a fever and your symptoms suddenly get worse. °· Your arm becomes pale, cool, tingly, or numb. °· You have heavy bleeding from the site. Hold pressure on the site. °Document Released: 02/23/2010 Document Revised:  04/15/2011 Document Reviewed: 02/23/2010 °ExitCare® Patient Information ©2015 [employer]. This information is not intended to replace advice given to you by your health care provider. Make sure you discuss any questions you have with your health care provider. ° °

## 2014-05-27 NOTE — Interval H&P Note (Signed)
History and Physical Interval Note:  05/27/2014 2:13 PM  Benjamin Cook  has presented today for surgery, with the diagnosis of angina  The various methods of treatment have been discussed with the patient and family. After consideration of risks, benefits and other options for treatment, the patient has consented to  Procedure(s): LEFT HEART CATHETERIZATION WITH CORONARY ANGIOGRAM (N/A) as a surgical intervention .  The patient's history has been reviewed, patient examined, no change in status, stable for surgery.  I have reviewed the patient's chart and labs.  Questions were answered to the patient's satisfaction.    Cath Lab Visit (complete for each Cath Lab visit)  Clinical Evaluation Leading to the Procedure:   ACS: No.  Non-ACS:    Anginal Classification: CCS I  Anti-ischemic medical therapy: Minimal Therapy (1 class of medications)  Non-Invasive Test Results: No non-invasive testing performed  Prior CABG: No previous CABG       Benjamin Cook

## 2014-05-27 NOTE — H&P (View-Only) (Signed)
Electrophysiology Office Note Date: 05/25/2014  ID:  Benjamin Cook, DOB Apr 04, 1945, MRN [id number]  PCP: Benjamin Shin, MD Primary Cardiologist: Benjamin Cook Electrophysiologist: Benjamin Cook  CC: Follow up for recent ICD shock  Benjamin Cook is a 69 y.o. male is seen today for Dr Benjamin Cook.  He presents today for follow up after recent ICD shock for VT.  He was working outside, developed dizziness, and lost consciousness.  He fell backwards and hit his head.  He sent a remote transmission which demonstrated VT at 233msec in the VF zone that failed to terminate with ATP but terminated with HV shock. This is his first appropriate therapy.  He has a history of CAD with LAD stent placed in 2001.  Myoview in 12/2012 was high risk with large scar in LAD and RCA territory but no ischemia.  He did not have chest pain prior to stent placement in 2001.  He reports compliance with his medications. He denies chest pain, palpitations, dyspnea, PND, orthopnea, nausea, vomiting, dizziness, edema, weight gain, or early satiety.   Device History: STJ dual chamber ICD implanted 2012 for primary prevention as part of Analyze-ST study.  In February of this year, his device was not able to be communicated with and he underwent generator change 03/18/14 History of appropriate therapy: Yes - 05/19/14 History of AAD therapy: No   Past Medical History  Diagnosis Date  . CAD (coronary artery disease)     a. s/p PCI to LAD 2001 b. myoview 2014 high risk with scar LAD/RCA territory but no ischemia  . Cardiomyopathy, ischemic   . CHF (congestive heart failure)     class II/III  . HTN (hypertension)   . Hyperlipidemia   . COPD (chronic obstructive pulmonary disease)     smokes cigars but has quit cigarettes  . PSA (psoriatic arthritis)     increased  . Adrenal mass     per pt this is remote (10 years) and benign by biopsy  . Anxiety   . PVD (peripheral vascular disease)    Past Surgical History  Procedure  Laterality Date  . Coronary stent placement      a. PCI to LAD 2001  . Cataract surgery    . Implantable cardioverter defibrillator (icd) generator change N/A 03/18/2014    a. SJM Fortify ST DR ICD implanted by [facility] for primary prevention b. gen change 03/2014     Current Outpatient Prescriptions  Medication Sig Dispense Refill  . aspirin EC 81 MG EC tablet Take 1 tablet (81 mg total) by mouth daily. 30 tablet   . atorvastatin (LIPITOR) 80 MG tablet TAKE 1 TABLET (80 MG TOTAL) BY MOUTH DAILY. 90 tablet 1  . carvedilol (COREG) 25 MG tablet TAKE 1 TABLET (25 MG TOTAL) BY MOUTH 2 (TWO) TIMES DAILY WITH A MEAL. 180 tablet 2  . Fish Oil OIL Take 360 mg by mouth daily.    . furosemide (LASIX) 40 MG tablet TAKE 1 TABLET (40 MG TOTAL) BY MOUTH DAILY. 90 tablet 2  . KLOR-CON M20 20 MEQ tablet TAKE 1 TABLET BY MOUTH DAILY. 30 tablet 1  . lisinopril (PRINIVIL,ZESTRIL) 20 MG tablet TAKE 1 TABLET (20 MG TOTAL) BY MOUTH DAILY. 90 tablet 1  . Multiple Vitamin (MULTIVITAMIN) capsule Take 1 capsule by mouth daily.      . nitroGLYCERIN (NITROSTAT) 0.4 MG SL tablet One tablet under tongue every 5 minutes as needed for chest pain--may repeat 3 times      No current  facility-administered medications for this visit.    Allergies:   Review of patient's allergies indicates no known allergies.   Social History: History   Social History  . Marital Status: Married    Spouse Name: N/A  . Number of Children: N/A  . Years of Education: N/A   Occupational History  . Lawn care    Social History Main Topics  . Smoking status: Current Every Day Smoker    Types: Cigars  . Smokeless tobacco: Never Used     Comment: smokes cigars now, no longer smokes cigarettes but previously smoked 2ppd  . Alcohol Use: No  . Drug Use: No  . Sexual Activity: Not on file   Other Topics Concern  . Not on file   Social History Narrative    Family History: Family History  Problem Relation Age of Onset  . Cancer Neg Hx     . Cirrhosis Mother     due to ETOH    Review of Systems: General: No chills, fever, night sweats or weight changes  Cardiovascular:  No chest pain, dyspnea on exertion, edema, orthopnea, palpitations, paroxysmal nocturnal dyspnea  Dermatological: No rash, lesions or masses Respiratory: No cough, dyspnea Urologic: No hematuria, dysuria Abdominal: No nausea, vomiting, diarrhea, bright red blood per rectum, melena, or hematemesis Neurologic: No visual changes, weakness, changes in mental status All other systems reviewed and are otherwise negative except as noted above.   Physical Exam: VS:  BP 110/70 mmHg  Pulse 69  Ht 5\' 11"  (1.803 m)  Wt 139 lb 9.6 oz (63.322 kg)  BMI 19.48 kg/m2 , BMI Body mass index is 19.48 kg/(m^2).  GEN- The patient is well appearing, alert and oriented x 3 today.   HEENT: normocephalic, atraumatic; sclera clear, conjunctiva pink; hearing intact; oropharynx clear; neck supple, no JVP Lymph- no cervical lymphadenopathy Lungs- Scattered rhonchi, normal work of breathing  Heart- Regular rate and rhythm, no murmurs, rubs or gallops  GI- soft, non-tender, non-distended, bowel sounds present  Extremities- no clubbing, cyanosis, or edema; DP/PT/radial pulses 2+ bilaterally MS- no significant deformity or atrophy Skin- warm and dry, no rash or lesion; ICD pocket well healed Psych- euthymic mood, full affect Neuro- strength and sensation are intact  ICD interrogation- reviewed in detail today,  See PACEART report  EKG:  EKG is ordered today. The ekg ordered today shows sinus rhythm, LVH, inferior lateral T wave inversions, poor R wave progression  Recent Labs: 03/16/2014: BUN 12; Creatinine 0.85; Hemoglobin 15.4; Platelets 197.0; Potassium 4.0; Sodium 137   Wt Readings from Last 3 Encounters:  05/25/14 139 lb 9.6 oz (63.322 kg)  03/18/14 145 lb (65.772 kg)  03/16/14 145 lb (65.772 kg)     Other studies Reviewed: Additional studies/ records that were  reviewed today include: prior catheterization, prior myoview  Assessment and Plan:  1.  Ventricular tachycardia This is the patient's first appropriate ICD therapy Will check BMET, Mg, CBC today D/w Dr Benjamin Cook, with known underlying CAD and previously abnormal myoview, will proceed with catheterization.  Risks, benefits reviewed with the patient today No driving x6 months (pt aware)  2.  Chronic systolic dysfunction euvolemic today Stable on an appropriate medical regimen Normal ICD function See Pace Art report No changes today  3.  HTN Stable No change required today  4.  Tobacco use Cessation advised, he is willing to attempt to try quitting cigars    Current medicines are reviewed at length with the patient today.   The patient does  not have concerns regarding his medicines.  The following changes were made today:  none  Labs/ tests ordered today include:  Orders Placed This Encounter  Procedures  . CBC w/Diff  . Basic Metabolic Panel (BMET)  . Magnesium  . PTT  . INR/PT  . EKG 12-Lead    Disposition:   Follow up with me in 3 weeks.  Will need to schedule follow up with Dr Benjamin Cook after catheterization.     Signed, Chanetta Marshall, NP 05/25/2014 4:54 PM  [place] [address] [phone] (office) [phone] (fax)

## 2014-05-29 DIAGNOSIS — I472 Ventricular tachycardia, unspecified: Secondary | ICD-10-CM | POA: Insufficient documentation

## 2014-06-15 ENCOUNTER — Encounter: Payer: Self-pay | Admitting: Nurse Practitioner

## 2014-06-15 ENCOUNTER — Ambulatory Visit (INDEPENDENT_AMBULATORY_CARE_PROVIDER_SITE_OTHER): Payer: Medicare Other | Admitting: Nurse Practitioner

## 2014-06-15 VITALS — BP 132/80 | HR 78 | Ht 71.0 in | Wt 138.0 lb

## 2014-06-15 DIAGNOSIS — I255 Ischemic cardiomyopathy: Secondary | ICD-10-CM

## 2014-06-15 DIAGNOSIS — I472 Ventricular tachycardia, unspecified: Secondary | ICD-10-CM

## 2014-06-15 DIAGNOSIS — I10 Essential (primary) hypertension: Secondary | ICD-10-CM | POA: Diagnosis not present

## 2014-06-15 NOTE — Patient Instructions (Signed)
Medication Instructions:  Your physician recommends that you continue on your current medications as directed. Please refer to the Current Medication list given to you today.  Labwork: None ordered  Testing/Procedures: None ordered  Follow-Up: Remote monitoring is used to monitor your Pacemaker of ICD from home. This monitoring reduces the number of office visits required to check your device to one time per year. It allows [place] to keep an eye on the functioning of your device to ensure it is working properly. You are scheduled for a device check from home on 09/14/14. You may send your transmission at any time that day. If you have a wireless device, the transmission will be sent automatically. After your physician reviews your transmission, you will receive a postcard with your next transmission date.  Your physician wants you to follow-up in: 6 months with Dr. [name].  You will receive a reminder letter in the mail two months in advance. If you don't receive a letter, please call our office to schedule the follow-up appointment.  Your physician wants you to follow-up in: 1 year with Dr. Rayann Heman.  You will receive a reminder letter in the mail two months in advance. If you don't receive a letter, please call our office to schedule the follow-up appointment.   Any Other Special Instructions Will Be Listed Below (If Applicable).

## 2014-06-15 NOTE — Progress Notes (Signed)
Electrophysiology Office Note Date: 06/15/2014  ID:  Benjamin Cook, DOB December 07, 1945, MRN [id number]  PCP: Renato Shin, MD Primary Cardiologist: [name] Electrophysiologist: Allred  CC: Follow up for ventricular tachycardia  Benjamin Cook is a 69 y.o. male is seen today for Dr Rayann Heman.  He recently had appropriate ICD therapy for VT and was evaluated with catheterization which demonstrated stable CAD with no targets for revascularization but with large scar.  He reports compliance with his medications. He denies chest pain, palpitations, dyspnea, PND, orthopnea, nausea, vomiting, dizziness, edema, weight gain, or early satiety.   Device History: STJ dual chamber ICD implanted 2012 for primary prevention as part of Analyze-ST study.  In February of 2016, his device was not able to be communicated with and he underwent generator change 03/18/14 History of appropriate therapy: Yes - 05/19/14 History of AAD therapy: No   Past Medical History  Diagnosis Date  . CAD (coronary artery disease)     a. s/p PCI to LAD 2001 b. myoview 2014 high risk with scar LAD/RCA territory but no ischemia  . Cardiomyopathy, ischemic   . CHF (congestive heart failure)     class II/III  . HTN (hypertension)   . Hyperlipidemia   . COPD (chronic obstructive pulmonary disease)     smokes cigars but has quit cigarettes  . PSA (psoriatic arthritis)     increased  . Adrenal mass     per pt this is remote (10 years) and benign by biopsy  . Anxiety   . PVD (peripheral vascular disease)    Past Surgical History  Procedure Laterality Date  . Coronary stent placement      a. PCI to LAD 2001  . Cataract surgery    . Implantable cardioverter defibrillator (icd) generator change N/A 03/18/2014    a. SJM Fortify ST DR ICD implanted by [facility] for primary prevention b. gen change 03/2014   . Left heart catheterization with coronary angiogram N/A 05/27/2014    Procedure: LEFT HEART CATHETERIZATION WITH CORONARY  ANGIOGRAM;  Surgeon: Sherren Mocha, MD;  Location: [facility] CATH LAB;  Service: Cardiovascular;  Laterality: N/A;    Current Outpatient Prescriptions  Medication Sig Dispense Refill  . aspirin EC 81 MG EC tablet Take 1 tablet (81 mg total) by mouth daily. 30 tablet   . atorvastatin (LIPITOR) 80 MG tablet TAKE 1 TABLET (80 MG TOTAL) BY MOUTH DAILY. 90 tablet 1  . carvedilol (COREG) 25 MG tablet TAKE 1 TABLET (25 MG TOTAL) BY MOUTH 2 (TWO) TIMES DAILY WITH A MEAL. 180 tablet 2  . Fish Oil OIL Take 360 mg by mouth daily.    . furosemide (LASIX) 40 MG tablet TAKE 1 TABLET (40 MG TOTAL) BY MOUTH DAILY. 90 tablet 2  . KLOR-CON M20 20 MEQ tablet TAKE 1 TABLET BY MOUTH DAILY. 30 tablet 1  . lisinopril (PRINIVIL,ZESTRIL) 20 MG tablet TAKE 1 TABLET (20 MG TOTAL) BY MOUTH DAILY. 90 tablet 1  . Multiple Vitamin (MULTIVITAMIN) capsule Take 1 capsule by mouth daily.      . nitroGLYCERIN (NITROSTAT) 0.4 MG SL tablet One tablet under tongue every 5 minutes as needed for chest pain--may repeat 3 times      No current facility-administered medications for this visit.    Allergies:   Review of patient's allergies indicates no known allergies.   Social History: History   Social History  . Marital Status: Married    Spouse Name: N/A  . Number of Children: N/A  .  Years of Education: N/A   Occupational History  . Lawn care    Social History Main Topics  . Smoking status: Current Every Day Smoker    Types: Cigars  . Smokeless tobacco: Never Used     Comment: smokes cigars now, no longer smokes cigarettes but previously smoked 2ppd  . Alcohol Use: No  . Drug Use: No  . Sexual Activity: Not on file   Other Topics Concern  . Not on file   Social History Narrative    Family History: Family History  Problem Relation Age of Onset  . Cancer Neg Hx   . Cirrhosis Mother     due to ETOH    Review of Systems: All other systems reviewed and are otherwise negative except as noted above.   Physical  Exam: VS:  BP 132/80 mmHg  Pulse 78  Ht 5\' 11"  (1.803 m)  Wt 138 lb (62.596 kg)  BMI 19.26 kg/m2 , BMI Body mass index is 19.26 kg/(m^2).  GEN- The patient is well appearing, alert and oriented x 3 today.   HEENT: normocephalic, atraumatic; sclera clear, conjunctiva pink; hearing intact; oropharynx clear; neck supple, no JVP Lymph- no cervical lymphadenopathy Lungs- Scattered rhonchi, normal work of breathing  Heart- Regular rate and rhythm, no murmurs, rubs or gallops  GI- soft, non-tender, non-distended, bowel sounds present  Extremities- no clubbing, cyanosis, or edema; DP/PT/radial pulses 2+ bilaterally, right radial cath site without hematoma/bruit MS- no significant deformity or atrophy Skin- warm and dry, no rash or lesion; ICD pocket well healed Psych- euthymic mood, full affect Neuro- strength and sensation are intact  EKG:  EKG is not ordered today  Recent Labs: 05/25/2014: BUN 12; Creatinine 0.87; Hemoglobin 15.2; Magnesium 1.9; Platelets 198.0; Potassium 3.7; Sodium 138   Wt Readings from Last 3 Encounters:  06/15/14 138 lb (62.596 kg)  05/25/14 139 lb 9.6 oz (63.322 kg)  03/18/14 145 lb (65.772 kg)     Assessment and Plan:  1.  Ventricular tachycardia No recurrent VT  Catheterization with stable CAD Continue medical therapy No driving x6 months (pt aware)  2.  Chronic systolic dysfunction euvolemic today Stable on an appropriate medical regimen  3.  HTN Stable No change required today  4.  Tobacco use Cessation advised, he is willing to attempt to try quitting cigars    Current medicines are reviewed at length with the patient today.   The patient does not have concerns regarding his medicines.  The following changes were made today:  none  Labs/ tests ordered today include: none  Disposition:   [place]lin, follow up with Dr [name] in 6 months, follow up with Dr Rayann Heman in 1 year  Signed, Chanetta Marshall, NP 06/15/2014 3:28 PM  [facility] [address] [place] [address] [phone] (office) [phone] (fax)

## 2014-06-21 ENCOUNTER — Other Ambulatory Visit: Payer: Self-pay

## 2014-06-21 MED ORDER — ATORVASTATIN CALCIUM 80 MG PO TABS: ORAL_TABLET | ORAL | Status: DC

## 2014-06-27 ENCOUNTER — Encounter: Payer: Medicare Other | Admitting: Internal Medicine

## 2014-07-06 ENCOUNTER — Other Ambulatory Visit: Payer: Self-pay

## 2014-07-06 MED ORDER — FUROSEMIDE 40 MG PO TABS: ORAL_TABLET | ORAL | Status: DC

## 2014-07-06 MED ORDER — POTASSIUM CHLORIDE CRYS ER 20 MEQ PO TBCR: 20.0000 meq | EXTENDED_RELEASE_TABLET | Freq: Every day | ORAL | Status: DC

## 2014-07-16 ENCOUNTER — Other Ambulatory Visit: Payer: Self-pay | Admitting: Cardiology

## 2014-07-18 NOTE — Telephone Encounter (Signed)
Rx(s) sent to pharmacy electronically.  

## 2014-07-26 ENCOUNTER — Other Ambulatory Visit: Payer: Self-pay

## 2014-07-26 MED ORDER — CARVEDILOL 25 MG PO TABS: ORAL_TABLET | ORAL | Status: DC

## 2014-07-26 MED ORDER — ATORVASTATIN CALCIUM 80 MG PO TABS: ORAL_TABLET | ORAL | Status: DC

## 2014-07-26 MED ORDER — LISINOPRIL 20 MG PO TABS: ORAL_TABLET | ORAL | Status: DC

## 2014-07-26 MED ORDER — POTASSIUM CHLORIDE CRYS ER 20 MEQ PO TBCR: 20.0000 meq | EXTENDED_RELEASE_TABLET | Freq: Every day | ORAL | Status: DC

## 2014-07-26 MED ORDER — FUROSEMIDE 40 MG PO TABS: ORAL_TABLET | ORAL | Status: DC

## 2014-09-14 ENCOUNTER — Encounter: Payer: Medicare Other | Admitting: *Deleted

## 2014-09-14 ENCOUNTER — Ambulatory Visit (INDEPENDENT_AMBULATORY_CARE_PROVIDER_SITE_OTHER): Payer: Medicare Other | Admitting: *Deleted

## 2014-09-14 ENCOUNTER — Telehealth: Payer: Self-pay | Admitting: Cardiology

## 2014-09-14 ENCOUNTER — Encounter: Payer: Self-pay | Admitting: Internal Medicine

## 2014-09-14 DIAGNOSIS — I255 Ischemic cardiomyopathy: Secondary | ICD-10-CM | POA: Diagnosis not present

## 2014-09-14 NOTE — Telephone Encounter (Signed)
Spoke with pt and reminded pt of remote transmission that is due today. Pt verbalized understanding.   

## 2014-09-15 ENCOUNTER — Encounter: Payer: Self-pay | Admitting: Cardiology

## 2014-09-22 NOTE — Progress Notes (Signed)
Remote ICD transmission.   

## 2014-09-23 LAB — CUP PACEART REMOTE DEVICE CHECK
Battery Remaining Longevity: 95 mo
Battery Remaining Percentage: 92 %
Battery Voltage: 3.16 V
Brady Statistic AP VS Percent: 1 %
Brady Statistic RA Percent Paced: 1 %
HIGH POWER IMPEDANCE MEASURED VALUE: 72 Ohm
HIGH POWER IMPEDANCE MEASURED VALUE: 72 Ohm
Lead Channel Impedance Value: 340 Ohm
Lead Channel Pacing Threshold Amplitude: 0.5 V
Lead Channel Pacing Threshold Amplitude: 1 V
Lead Channel Pacing Threshold Pulse Width: 0.5 ms
Lead Channel Sensing Intrinsic Amplitude: 11.6 mV
Lead Channel Sensing Intrinsic Amplitude: 2.4 mV
Lead Channel Setting Pacing Amplitude: 2.5 V
Lead Channel Setting Pacing Pulse Width: 0.5 ms
Lead Channel Setting Sensing Sensitivity: 0.5 mV
MDC IDC MSMT LEADCHNL RA IMPEDANCE VALUE: 430 Ohm
MDC IDC MSMT LEADCHNL RV PACING THRESHOLD PULSEWIDTH: 0.5 ms
MDC IDC PG SERIAL: 7225079
MDC IDC SESS DTM: 20160810092025
MDC IDC SET LEADCHNL RA PACING AMPLITUDE: 2 V
MDC IDC STAT BRADY AP VP PERCENT: 1 %
MDC IDC STAT BRADY AS VP PERCENT: 1 %
MDC IDC STAT BRADY AS VS PERCENT: 96 %
MDC IDC STAT BRADY RV PERCENT PACED: 1 %
Zone Setting Detection Interval: 240 ms
Zone Setting Detection Interval: 320 ms

## 2014-09-27 ENCOUNTER — Encounter: Payer: Self-pay | Admitting: Cardiology

## 2014-10-11 ENCOUNTER — Encounter: Payer: Self-pay | Admitting: Cardiology

## 2014-11-04 ENCOUNTER — Encounter: Payer: Self-pay | Admitting: Gastroenterology

## 2014-12-14 ENCOUNTER — Ambulatory Visit (INDEPENDENT_AMBULATORY_CARE_PROVIDER_SITE_OTHER): Payer: Medicare Other | Admitting: *Deleted

## 2014-12-14 ENCOUNTER — Telehealth: Payer: Self-pay | Admitting: Cardiology

## 2014-12-14 DIAGNOSIS — I255 Ischemic cardiomyopathy: Secondary | ICD-10-CM

## 2014-12-14 NOTE — Telephone Encounter (Signed)
Confirmed remote transmission w/ pt wfie.

## 2014-12-15 LAB — CUP PACEART REMOTE DEVICE CHECK
Battery Remaining Percentage: 89 %
Battery Voltage: 3.11 V
Brady Statistic AS VP Percent: 1 %
Brady Statistic AS VS Percent: 97 %
Brady Statistic RA Percent Paced: 1 %
Date Time Interrogation Session: 20161109081141
HighPow Impedance: 77 Ohm
HighPow Impedance: 77 Ohm
Implantable Lead Implant Date: 20120420
Implantable Lead Location: 753859
Implantable Lead Location: 753860
Lead Channel Impedance Value: 340 Ohm
Lead Channel Sensing Intrinsic Amplitude: 2.5 mV
Lead Channel Setting Pacing Amplitude: 2 V
Lead Channel Setting Pacing Amplitude: 2.5 V
Lead Channel Setting Sensing Sensitivity: 0.5 mV
MDC IDC LEAD IMPLANT DT: 20120420
MDC IDC MSMT BATTERY REMAINING LONGEVITY: 92 mo
MDC IDC MSMT LEADCHNL RA IMPEDANCE VALUE: 410 Ohm
MDC IDC MSMT LEADCHNL RV SENSING INTR AMPL: 11.6 mV
MDC IDC SET LEADCHNL RV PACING PULSEWIDTH: 0.5 ms
MDC IDC STAT BRADY AP VP PERCENT: 1 %
MDC IDC STAT BRADY AP VS PERCENT: 1 %
MDC IDC STAT BRADY RV PERCENT PACED: 1 %
Pulse Gen Serial Number: 7225079

## 2014-12-15 NOTE — Progress Notes (Signed)
Remote ICD transmission.   

## 2014-12-16 ENCOUNTER — Encounter: Payer: Self-pay | Admitting: Cardiology

## 2015-01-03 ENCOUNTER — Encounter: Payer: Self-pay | Admitting: Cardiology

## 2015-01-12 ENCOUNTER — Telehealth: Payer: Self-pay | Admitting: *Deleted

## 2015-01-12 ENCOUNTER — Other Ambulatory Visit: Payer: Self-pay | Admitting: Internal Medicine

## 2015-01-12 MED ORDER — CARVEDILOL 25 MG PO TABS: ORAL_TABLET | ORAL | Status: DC

## 2015-01-12 MED ORDER — ATORVASTATIN CALCIUM 80 MG PO TABS: ORAL_TABLET | ORAL | Status: DC

## 2015-01-12 NOTE — Telephone Encounter (Signed)
Unable to reach patient or wife. The VM system is not set up to LVM. His rx are waiting at the pharmacy for pick up.

## 2015-02-28 ENCOUNTER — Other Ambulatory Visit: Payer: Self-pay | Admitting: Internal Medicine

## 2015-03-08 DIAGNOSIS — I480 Paroxysmal atrial fibrillation: Secondary | ICD-10-CM

## 2015-03-08 DIAGNOSIS — J111 Influenza due to unidentified influenza virus with other respiratory manifestations: Secondary | ICD-10-CM

## 2015-03-08 HISTORY — DX: Paroxysmal atrial fibrillation: I48.0

## 2015-03-08 HISTORY — DX: Influenza due to unidentified influenza virus with other respiratory manifestations: J11.1

## 2015-03-15 ENCOUNTER — Encounter: Payer: Medicare Other | Admitting: *Deleted

## 2015-03-15 ENCOUNTER — Telehealth: Payer: Self-pay | Admitting: Cardiology

## 2015-03-15 NOTE — Telephone Encounter (Signed)
Attempted to confirm remote transmission with pt. No answer and was unable to leave a message.   

## 2015-03-17 ENCOUNTER — Encounter: Payer: Self-pay | Admitting: Cardiology

## 2015-03-28 ENCOUNTER — Inpatient Hospital Stay (HOSPITAL_COMMUNITY)
Admission: EM | Admit: 2015-03-28 | Discharge: 2015-03-30 | DRG: 193 | Disposition: A | Discharge status: Home / Self Care | Payer: Medicare Other | Attending: Internal Medicine | Admitting: Internal Medicine

## 2015-03-28 ENCOUNTER — Emergency Department (HOSPITAL_COMMUNITY): Payer: Medicare Other

## 2015-03-28 ENCOUNTER — Encounter (HOSPITAL_COMMUNITY): Payer: Self-pay | Admitting: *Deleted

## 2015-03-28 DIAGNOSIS — J9600 Acute respiratory failure, unspecified whether with hypoxia or hypercapnia: Secondary | ICD-10-CM | POA: Diagnosis present

## 2015-03-28 DIAGNOSIS — I110 Hypertensive heart disease with heart failure: Secondary | ICD-10-CM | POA: Diagnosis not present

## 2015-03-28 DIAGNOSIS — Z95810 Presence of automatic (implantable) cardiac defibrillator: Secondary | ICD-10-CM | POA: Diagnosis not present

## 2015-03-28 DIAGNOSIS — I255 Ischemic cardiomyopathy: Secondary | ICD-10-CM | POA: Diagnosis not present

## 2015-03-28 DIAGNOSIS — I739 Peripheral vascular disease, unspecified: Secondary | ICD-10-CM | POA: Diagnosis not present

## 2015-03-28 DIAGNOSIS — E785 Hyperlipidemia, unspecified: Secondary | ICD-10-CM | POA: Diagnosis not present

## 2015-03-28 DIAGNOSIS — J441 Chronic obstructive pulmonary disease with (acute) exacerbation: Secondary | ICD-10-CM | POA: Diagnosis present

## 2015-03-28 DIAGNOSIS — I10 Essential (primary) hypertension: Secondary | ICD-10-CM

## 2015-03-28 DIAGNOSIS — Z79899 Other long term (current) drug therapy: Secondary | ICD-10-CM

## 2015-03-28 DIAGNOSIS — R509 Fever, unspecified: Secondary | ICD-10-CM | POA: Diagnosis not present

## 2015-03-28 DIAGNOSIS — I5022 Chronic systolic (congestive) heart failure: Secondary | ICD-10-CM | POA: Diagnosis not present

## 2015-03-28 DIAGNOSIS — Z7982 Long term (current) use of aspirin: Secondary | ICD-10-CM

## 2015-03-28 DIAGNOSIS — Z955 Presence of coronary angioplasty implant and graft: Secondary | ICD-10-CM

## 2015-03-28 DIAGNOSIS — I2510 Atherosclerotic heart disease of native coronary artery without angina pectoris: Secondary | ICD-10-CM | POA: Diagnosis present

## 2015-03-28 DIAGNOSIS — R0602 Shortness of breath: Secondary | ICD-10-CM | POA: Insufficient documentation

## 2015-03-28 DIAGNOSIS — J09X2 Influenza due to identified novel influenza A virus with other respiratory manifestations: Principal | ICD-10-CM | POA: Diagnosis present

## 2015-03-28 DIAGNOSIS — I5020 Unspecified systolic (congestive) heart failure: Secondary | ICD-10-CM

## 2015-03-28 DIAGNOSIS — J101 Influenza due to other identified influenza virus with other respiratory manifestations: Secondary | ICD-10-CM | POA: Diagnosis present

## 2015-03-28 DIAGNOSIS — F17290 Nicotine dependence, other tobacco product, uncomplicated: Secondary | ICD-10-CM | POA: Diagnosis present

## 2015-03-28 DIAGNOSIS — R05 Cough: Secondary | ICD-10-CM | POA: Diagnosis not present

## 2015-03-28 DIAGNOSIS — F419 Anxiety disorder, unspecified: Secondary | ICD-10-CM | POA: Diagnosis present

## 2015-03-28 DIAGNOSIS — I509 Heart failure, unspecified: Secondary | ICD-10-CM | POA: Diagnosis not present

## 2015-03-28 DIAGNOSIS — J9601 Acute respiratory failure with hypoxia: Secondary | ICD-10-CM | POA: Diagnosis not present

## 2015-03-28 HISTORY — DX: Influenza due to unidentified influenza virus with other respiratory manifestations: J11.1

## 2015-03-28 LAB — URINALYSIS, ROUTINE W REFLEX MICROSCOPIC
BILIRUBIN URINE: NEGATIVE
Glucose, UA: NEGATIVE mg/dL
Ketones, ur: 15 mg/dL — AB
Leukocytes, UA: NEGATIVE
NITRITE: NEGATIVE
PROTEIN: 100 mg/dL — AB
SPECIFIC GRAVITY, URINE: 1.028 (ref 1.005–1.030)
pH: 5 (ref 5.0–8.0)

## 2015-03-28 LAB — POCT I-STAT 3, ART BLOOD GAS (G3+)
Acid-Base Excess: 1 mmol/L (ref 0.0–2.0)
BICARBONATE: 24 meq/L (ref 20.0–24.0)
BICARBONATE: 24.6 meq/L — AB (ref 20.0–24.0)
O2 Saturation: 85 %
O2 Saturation: 85 %
PH ART: 7.422 (ref 7.350–7.450)
PH ART: 7.432 (ref 7.350–7.450)
PO2 ART: 48 mmHg — AB (ref 80.0–100.0)
PO2 ART: 49 mmHg — AB (ref 80.0–100.0)
TCO2: 25 mmol/L (ref 0–100)
TCO2: 26 mmol/L (ref 0–100)
pCO2 arterial: 36.8 mmHg (ref 35.0–45.0)
pCO2 arterial: 36.8 mmHg (ref 35.0–45.0)

## 2015-03-28 LAB — CBC WITH DIFFERENTIAL/PLATELET
BASOS PCT: 0 %
Basophils Absolute: 0 10*3/uL (ref 0.0–0.1)
EOS ABS: 0 10*3/uL (ref 0.0–0.7)
EOS PCT: 0 %
HCT: 43.8 % (ref 39.0–52.0)
HEMOGLOBIN: 14.6 g/dL (ref 13.0–17.0)
Lymphocytes Relative: 10 %
Lymphs Abs: 0.9 10*3/uL (ref 0.7–4.0)
MCH: 30.5 pg (ref 26.0–34.0)
MCHC: 33.3 g/dL (ref 30.0–36.0)
MCV: 91.6 fL (ref 78.0–100.0)
Monocytes Absolute: 0.9 10*3/uL (ref 0.1–1.0)
Monocytes Relative: 9 %
NEUTROS PCT: 81 %
Neutro Abs: 7.2 10*3/uL (ref 1.7–7.7)
PLATELETS: 124 10*3/uL — AB (ref 150–400)
RBC: 4.78 MIL/uL (ref 4.22–5.81)
RDW: 15.2 % (ref 11.5–15.5)
WBC: 9 10*3/uL (ref 4.0–10.5)

## 2015-03-28 LAB — RAPID STREP SCREEN (MED CTR MEBANE ONLY): STREPTOCOCCUS, GROUP A SCREEN (DIRECT): NEGATIVE

## 2015-03-28 LAB — I-STAT CG4 LACTIC ACID, ED: LACTIC ACID, VENOUS: 1.26 mmol/L (ref 0.5–2.0)

## 2015-03-28 LAB — INFLUENZA PANEL BY PCR (TYPE A & B)
H1N1 flu by pcr: NOT DETECTED
INFLAPCR: POSITIVE — AB
Influenza B By PCR: NEGATIVE

## 2015-03-28 LAB — C DIFFICILE QUICK SCREEN W PCR REFLEX
C Diff antigen: NEGATIVE
C Diff interpretation: NEGATIVE
C Diff toxin: NEGATIVE

## 2015-03-28 LAB — COMPREHENSIVE METABOLIC PANEL
ALBUMIN: 3.9 g/dL (ref 3.5–5.0)
ALT: 28 U/L (ref 17–63)
ANION GAP: 13 (ref 5–15)
AST: 50 U/L — ABNORMAL HIGH (ref 15–41)
Alkaline Phosphatase: 60 U/L (ref 38–126)
BUN: 10 mg/dL (ref 6–20)
CALCIUM: 9.3 mg/dL (ref 8.9–10.3)
CHLORIDE: 103 mmol/L (ref 101–111)
CO2: 23 mmol/L (ref 22–32)
CREATININE: 1.05 mg/dL (ref 0.61–1.24)
GFR calc Af Amer: >60 mL/min (ref >60)
GFR calc non Af Amer: >60 mL/min (ref >60)
GLUCOSE: 125 mg/dL — AB (ref 65–99)
POTASSIUM: 4 mmol/L (ref 3.5–5.1)
SODIUM: 139 mmol/L (ref 135–145)
Total Bilirubin: 0.7 mg/dL (ref 0.3–1.2)
Total Protein: 7.5 g/dL (ref 6.5–8.1)

## 2015-03-28 LAB — PROCALCITONIN: PROCALCITONIN: 1.05 ng/mL

## 2015-03-28 LAB — I-STAT TROPONIN, ED: Troponin i, poc: 0.06 ng/mL (ref 0.00–0.08)

## 2015-03-28 LAB — URINE MICROSCOPIC-ADD ON

## 2015-03-28 MED ORDER — PREDNISONE 50 MG PO TABS
60.0000 mg | ORAL_TABLET | Freq: Every day | ORAL | Status: DC
  Administered 2015-03-29 – 2015-03-30 (×2): 60 mg via ORAL
  Filled 2015-03-28 (×2): qty 1

## 2015-03-28 MED ORDER — IPRATROPIUM-ALBUTEROL 0.5-2.5 (3) MG/3ML IN SOLN
3.0000 mL | Freq: Four times a day (QID) | RESPIRATORY_TRACT | Status: DC
  Administered 2015-03-29 (×4): 3 mL via RESPIRATORY_TRACT
  Filled 2015-03-28 (×4): qty 3

## 2015-03-28 MED ORDER — ACETAMINOPHEN 650 MG RE SUPP: 650.0000 mg | Freq: Four times a day (QID) | RECTAL | Status: DC | PRN

## 2015-03-28 MED ORDER — ACETAMINOPHEN 325 MG PO TABS
650.0000 mg | ORAL_TABLET | Freq: Once | ORAL | Status: AC | PRN
  Administered 2015-03-28: 650 mg via ORAL

## 2015-03-28 MED ORDER — ACETAMINOPHEN 325 MG PO TABS
ORAL_TABLET | ORAL | Status: AC
  Filled 2015-03-28: qty 2

## 2015-03-28 MED ORDER — ACETAMINOPHEN 325 MG PO TABS
650.0000 mg | ORAL_TABLET | Freq: Four times a day (QID) | ORAL | Status: DC | PRN
  Administered 2015-03-28: 650 mg via ORAL
  Filled 2015-03-28: qty 2

## 2015-03-28 MED ORDER — ONDANSETRON HCL 4 MG/2ML IJ SOLN: 4.0000 mg | Freq: Four times a day (QID) | INTRAMUSCULAR | Status: DC | PRN

## 2015-03-28 MED ORDER — IPRATROPIUM-ALBUTEROL 0.5-2.5 (3) MG/3ML IN SOLN: 3.0000 mL | RESPIRATORY_TRACT | Status: DC | PRN

## 2015-03-28 MED ORDER — GUAIFENESIN ER 600 MG PO TB12
1200.0000 mg | ORAL_TABLET | Freq: Two times a day (BID) | ORAL | Status: DC
  Administered 2015-03-28 – 2015-03-30 (×4): 1200 mg via ORAL
  Filled 2015-03-28 (×4): qty 2

## 2015-03-28 MED ORDER — LISINOPRIL 20 MG PO TABS
20.0000 mg | ORAL_TABLET | Freq: Every day | ORAL | Status: DC
  Administered 2015-03-28 – 2015-03-30 (×3): 20 mg via ORAL
  Filled 2015-03-28 (×3): qty 1

## 2015-03-28 MED ORDER — FUROSEMIDE 40 MG PO TABS
40.0000 mg | ORAL_TABLET | Freq: Every day | ORAL | Status: DC
  Administered 2015-03-29 – 2015-03-30 (×2): 40 mg via ORAL
  Filled 2015-03-28 (×2): qty 1

## 2015-03-28 MED ORDER — SODIUM CHLORIDE 0.9 % IV BOLUS (SEPSIS): 500.0000 mL | Freq: Once | INTRAVENOUS | Status: DC

## 2015-03-28 MED ORDER — POTASSIUM CHLORIDE CRYS ER 20 MEQ PO TBCR
20.0000 meq | EXTENDED_RELEASE_TABLET | Freq: Every day | ORAL | Status: DC
  Administered 2015-03-28 – 2015-03-30 (×3): 20 meq via ORAL
  Filled 2015-03-28 (×3): qty 1

## 2015-03-28 MED ORDER — SODIUM CHLORIDE 0.9 % IV SOLN
INTRAVENOUS | Status: DC
  Administered 2015-03-28 – 2015-03-29 (×2): via INTRAVENOUS

## 2015-03-28 MED ORDER — SODIUM CHLORIDE 0.9% FLUSH
3.0000 mL | Freq: Two times a day (BID) | INTRAVENOUS | Status: DC
  Administered 2015-03-29 – 2015-03-30 (×2): 3 mL via INTRAVENOUS

## 2015-03-28 MED ORDER — METHYLPREDNISOLONE SODIUM SUCC 125 MG IJ SOLR
125.0000 mg | Freq: Once | INTRAMUSCULAR | Status: AC
  Administered 2015-03-28: 125 mg via INTRAVENOUS
  Filled 2015-03-28: qty 2

## 2015-03-28 MED ORDER — IPRATROPIUM BROMIDE 0.02 % IN SOLN
0.5000 mg | Freq: Once | RESPIRATORY_TRACT | Status: AC
Start: 2015-03-28 — End: 2015-03-28
  Administered 2015-03-28: 0.5 mg via RESPIRATORY_TRACT
  Filled 2015-03-28: qty 2.5

## 2015-03-28 MED ORDER — ASPIRIN EC 81 MG PO TBEC
81.0000 mg | DELAYED_RELEASE_TABLET | Freq: Every day | ORAL | Status: DC
  Administered 2015-03-28 – 2015-03-30 (×3): 81 mg via ORAL
  Filled 2015-03-28 (×3): qty 1

## 2015-03-28 MED ORDER — OSELTAMIVIR PHOSPHATE 75 MG PO CAPS
75.0000 mg | ORAL_CAPSULE | Freq: Two times a day (BID) | ORAL | Status: DC
  Administered 2015-03-28 – 2015-03-30 (×4): 75 mg via ORAL
  Filled 2015-03-28 (×4): qty 1

## 2015-03-28 MED ORDER — CARVEDILOL 25 MG PO TABS
25.0000 mg | ORAL_TABLET | Freq: Two times a day (BID) | ORAL | Status: DC
  Administered 2015-03-28 – 2015-03-30 (×4): 25 mg via ORAL
  Filled 2015-03-28 (×4): qty 1

## 2015-03-28 MED ORDER — ONDANSETRON HCL 4 MG PO TABS: 4.0000 mg | ORAL_TABLET | Freq: Four times a day (QID) | ORAL | Status: DC | PRN

## 2015-03-28 MED ORDER — ATORVASTATIN CALCIUM 80 MG PO TABS
80.0000 mg | ORAL_TABLET | Freq: Every day | ORAL | Status: DC
  Administered 2015-03-28 – 2015-03-29 (×2): 80 mg via ORAL
  Filled 2015-03-28 (×2): qty 1

## 2015-03-28 MED ORDER — IPRATROPIUM-ALBUTEROL 0.5-2.5 (3) MG/3ML IN SOLN
3.0000 mL | RESPIRATORY_TRACT | Status: DC
  Administered 2015-03-28: 3 mL via RESPIRATORY_TRACT
  Filled 2015-03-28: qty 3

## 2015-03-28 MED ORDER — ENOXAPARIN SODIUM 40 MG/0.4ML ~~LOC~~ SOLN
40.0000 mg | SUBCUTANEOUS | Status: DC
  Administered 2015-03-28 – 2015-03-29 (×2): 40 mg via SUBCUTANEOUS
  Filled 2015-03-28 (×2): qty 0.4

## 2015-03-28 MED ORDER — ALBUTEROL SULFATE (2.5 MG/3ML) 0.083% IN NEBU
5.0000 mg | INHALATION_SOLUTION | Freq: Once | RESPIRATORY_TRACT | Status: AC
  Administered 2015-03-28: 5 mg via RESPIRATORY_TRACT
  Filled 2015-03-28: qty 6

## 2015-03-28 NOTE — ED Notes (Signed)
PT is here with fever, cough, and weakness. Denies chest pain.  Pt has cough since Sunday evening.

## 2015-03-28 NOTE — ED Notes (Signed)
Attempted report x1. 

## 2015-03-28 NOTE — H&P (Signed)
[facility] History and Physical  Benjamin Cook [id number] DOB: 12-05-1945 DOA: 03/28/2015  Referring physician: PA Natividad Brood PCP: Renato Shin, MD   Chief Complaint: Fever, cough weakness  HPI: Benjamin Cook is a 70 y.o. male  Fever, nasal congestion, productive cough, fatigue and general malaise. Started approximately 2 days ago. Associated with some mild chest discomfort and shortness of breath. Does not use oxygen at home. Single episode of nonbloody nonbilious emesis. Likely Robitussin and Advil without improvement. Patient states he did get his flu shot in October. Denies any sick contacts. Overall symptoms are getting worse.  Wife states patient has-been exhibiting some mild confusion. Still have a normal conversation but seems to get much more easily confused than normal.  Review of Systems:  Constitutional:  No weight loss, night sweats,  HEENT:  No Difficulty swallowing,Tooth/dental problems,Sore throat, Cardio-vascular:  No chest pain, Orthopnea, PND, swelling in lower extremities, anasarca, dizziness, palpitations  GI: PeR HPi Resp: Per HPI Skin:  no rash or lesions.  GU:  no dysuria, change in color of urine, no urgency or frequency. No flank pain.  Musculoskeletal:   No joint pain or swelling. No decreased range of motion. No back pain.  Psych:  No change in mood or affect. No depression or anxiety. No memory loss.  Neuro:  No change in sensation, unilateral strength, or cognitive abilities  All other systems were reviewed and are negative.  Past Medical History  Diagnosis Date  . CAD (coronary artery disease)     a. s/p PCI to LAD 2001 b. myoview 2014 high risk with scar LAD/RCA territory but no ischemia  . Cardiomyopathy, ischemic   . CHF (congestive heart failure) ([name])     class II/III  . HTN (hypertension)   . Hyperlipidemia   . COPD (chronic obstructive pulmonary disease) ([name])     smokes cigars but has quit cigarettes  . PSA  (psoriatic arthritis) ([place])     increased  . Adrenal mass ([place])     per pt this is remote (10 years) and benign by biopsy  . Anxiety   . PVD (peripheral vascular disease) [facility])    Past Surgical History  Procedure Laterality Date  . Coronary stent placement      a. PCI to LAD 2001  . Cataract surgery    . Implantable cardioverter defibrillator (icd) generator change N/A 03/18/2014    a. SJM Fortify ST DR ICD implanted by [facility] for primary prevention b. gen change 03/2014   . Left heart catheterization with coronary angiogram N/A 05/27/2014    Procedure: LEFT HEART CATHETERIZATION WITH CORONARY ANGIOGRAM;  Surgeon: Sherren Mocha, MD;  Location: [facility] CATH LAB;  Service: Cardiovascular;  Laterality: N/A;   Social History:  reports that he has been smoking Cigars.  He has never used smokeless tobacco. He reports that he does not drink alcohol or use illicit drugs.  No Known Allergies  Family History  Problem Relation Age of Onset  . Cancer Neg Hx   . Cirrhosis Mother     due to ETOH     Prior to Admission medications   Medication Sig Start Date End Date Taking? Authorizing Provider  aspirin EC 81 MG EC tablet Take 1 tablet (81 mg total) by mouth daily. 04/08/12   Imogene Burn, PA-C  atorvastatin (LIPITOR) 80 MG tablet Take 1 tablet (80 mg total) by mouth daily at 6 PM. Needs appointment 02/28/15   Lelon Perla, MD  carvedilol (COREG) 25 MG tablet  Take 1 tablet (25 mg total) by mouth 2 (two) times daily with a meal. Needs appointment 02/28/15   Lelon Perla, MD  Fish Oil OIL Take 360 mg by mouth daily.    Historical Provider, MD  furosemide (LASIX) 40 MG tablet Take 1 tablet by mouth  daily 01/12/15   Thompson Grayer, MD  lisinopril (PRINIVIL,ZESTRIL) 20 MG tablet Take 1 tablet (20 mg total) by mouth daily. Needs appointment 02/28/15   Lelon Perla, MD  Multiple Vitamin (MULTIVITAMIN) capsule Take 1 capsule by mouth daily.      Historical Provider, MD  nitroGLYCERIN (NITROSTAT) 0.4 MG  SL tablet One tablet under tongue every 5 minutes as needed for chest pain--may repeat 3 times     Historical Provider, MD  potassium chloride SA (K-DUR,KLOR-CON) 20 MEQ tablet Take 1 tablet by mouth  daily 01/12/15   Thompson Grayer, MD   Physical Exam: Filed Vitals:   03/28/15 1430 03/28/15 1515 03/28/15 1530 03/28/15 1615  BP: 169/96 162/70 171/86 169/77  Pulse: 90 100 105 85  Temp:      TempSrc:      Resp: 25 38 33 37  Weight:      SpO2: 90% 96% 97% 92%    Wt Readings from Last 3 Encounters:  03/28/15 58.968 kg (130 lb)  06/15/14 62.596 kg (138 lb)  05/27/14 65.318 kg (144 lb)    General: Mild distress, elderly. Eyes:  PERRL, EOMI, normal lids, iris ENT: Dry MM Neck:  no LAD, masses or thyromegaly Cardiovascular:  RRR, II/VI. No LE edema.  Respiratory: Diffuse rhonchi and wheezing throughout, increased effort, on 3 L nasal cannula  Abdomen:  soft, ntnd Skin:  no rash or induration seen on limited exam Musculoskeletal:  grossly normal tone BUE/BLE Psychiatric: Intermittently slow to answer and sometimes fumbles with giving accurate answers but ultimately patient is alert and oriented 3. Neurologic:  CN 2-12 grossly intact, moves all extremities in coordinated fashion.          Labs on Admission:  Basic Metabolic Panel:  Recent Labs Lab 03/28/15 1105  NA 139  K 4.0  CL 103  CO2 23  GLUCOSE 125*  BUN 10  CREATININE 1.05  CALCIUM 9.3   Liver Function Tests:  Recent Labs Lab 03/28/15 1105  AST 50*  ALT 28  ALKPHOS 60  BILITOT 0.7  PROT 7.5  ALBUMIN 3.9   No results for input(s): LIPASE, AMYLASE in the last 168 hours. No results for input(s): AMMONIA in the last 168 hours. CBC:  Recent Labs Lab 03/28/15 1105  WBC 9.0  NEUTROABS 7.2  HGB 14.6  HCT 43.8  MCV 91.6  PLT 124*   Cardiac Enzymes: No results for input(s): CKTOTAL, CKMB, CKMBINDEX, TROPONINI in the last 168 hours.  BNP (last 3 results) No results for input(s): BNP in the last 8760  hours.  ProBNP (last 3 results) No results for input(s): PROBNP in the last 8760 hours.   Creatinine clearance cannot be calculated (Unknown ideal weight.)  CBG: No results for input(s): GLUCAP in the last 168 hours.  Radiological Exams on Admission: Dg Chest 2 View  03/28/2015  CLINICAL DATA:  Shortness of breath, cough, congestion for 3 days. EXAM: CHEST  2 VIEW COMPARISON:  05/26/2010 FINDINGS: There is hyperinflation of the lungs compatible with COPD. Left pacer remains in place, unchanged. Heart is borderline in size. No confluent airspace opacities or effusions. No acute bony abnormality. IMPRESSION: COPD.  No active disease. Electronically Signed  By: Rolm Baptise M.D.   On: 03/28/2015 11:26     Assessment/Plan Active Problems:   HLD (hyperlipidemia)   Acute respiratory failure ([name])   Essential hypertension   Systolic CHF ([place])   Acute respiratory failure: Patient with new O2 requirement. Likely secondary to underlying COPD worsened by acute viral illness. Influenza A positive. No evidence of pneumonia on CXR. Lactic acid 1.26. Solumedrol 125 adn duoneb given in ED.  - Telemetry - Tamiflu - IVF NS 50cc/hr - Prednisone - Duo nebs - Mucinex - Consider antibiotics if not improving - O2 prn - ABG  HTN: - continue lisionpril, Coreg  HLD: - continue statin  CHF: 30% EF by last echo in 2010. No evidence of acute decompensation - continue lasix and ACEi, Coreg    Code Status: FULL  DVT Prophylaxis: Lovenox Family Communication: wife Disposition Plan: Pending Improvement    Azrael Huss J, MD Family Medicine [facility] [email or web address] Password [email or web address]

## 2015-03-28 NOTE — ED Provider Notes (Signed)
Medical screening examination/treatment/procedure(s) were conducted as a shared visit with non-physician practitioner(s) and myself.  I personally evaluated the patient during the encounter.   EKG Interpretation   Date/Time:  Tuesday March 28 2015 10:27:29 EST Ventricular Rate:  92 PR Interval:  150 QRS Duration: 96 QT Interval:  346 QTC Calculation: 427 R Axis:   26 Text Interpretation:  Sinus rhythm with occasional Premature ventricular  complexes Possible Left atrial enlargement Left ventricular hypertrophy  with repolarization abnormality Abnormal ECG Since last tracing rate  faster Otherwise no significant change Confirmed by Windie Marasco MD, Shahzaib Azevedo  [id number]) on 03/28/2015 11:21:26 AM     70 y.o. male presents with cough and weakness over last 3 days. New O2 requirement. Faint wheezing and tachypnea. Suspect viral mediated reactive airway disease and possible influenza. Nebs and admit.  See related encounter note   Leo Grosser, MD 03/28/15 2203

## 2015-03-28 NOTE — ED Provider Notes (Signed)
CSN: [id number]     Arrival date & time 03/28/15  0903 History   First MD Initiated Contact with Patient 03/28/15 1107     Chief Complaint  Patient presents with  . Fever  . Weakness  . Cough    HPI   Benjamin Cook is a 70 y.o. male with a PMH of HTN, HLD, CAD, COPD, CHF who presents to the ED with fever, nasal congestion, productive cough, fatigue, and generalized weakness. He states his symptoms started Sunday and have been constant since that time. He denies exacerbating or alleviating factors. He notes associated chest pain and shortness of breath. He also reports nausea and one episode of emesis. He denies abdominal pain, diarrhea, constipation, syncope, numbness, paresthesia.  PCP: Dr. Loanne Drilling [facility])   Past Medical History  Diagnosis Date  . CAD (coronary artery disease)     a. s/p PCI to LAD 2001 b. myoview 2014 high risk with scar LAD/RCA territory but no ischemia  . Cardiomyopathy, ischemic   . CHF (congestive heart failure) ([name])     class II/III  . HTN (hypertension)   . Hyperlipidemia   . COPD (chronic obstructive pulmonary disease) ([name])     smokes cigars but has quit cigarettes  . PSA (psoriatic arthritis) ([place])     increased  . Adrenal mass ([place])     per pt this is remote (10 years) and benign by biopsy  . Anxiety   . PVD (peripheral vascular disease) [facility])    Past Surgical History  Procedure Laterality Date  . Coronary stent placement      a. PCI to LAD 2001  . Cataract surgery    . Implantable cardioverter defibrillator (icd) generator change N/A 03/18/2014    a. SJM Fortify ST DR ICD implanted by [facility] for primary prevention b. gen change 03/2014   . Left heart catheterization with coronary angiogram N/A 05/27/2014    Procedure: LEFT HEART CATHETERIZATION WITH CORONARY ANGIOGRAM;  Surgeon: Sherren Mocha, MD;  Location[facility] CATH LAB;  Service: Cardiovascular;  Laterality: N/A;   Family History  Problem Relation Age of Onset  . Cancer Neg Hx   .  Cirrhosis Mother     due to ETOH   Social History  Substance Use Topics  . Smoking status: Current Every Day Smoker    Types: Cigars  . Smokeless tobacco: Never Used     Comment: smokes cigars now, no longer smokes cigarettes but previously smoked 2ppd  . Alcohol Use: No      Review of Systems  Constitutional: Positive for fever, chills and fatigue.  HENT: Positive for congestion and sore throat.   Respiratory: Positive for cough and shortness of breath.   Cardiovascular: Positive for chest pain.  Gastrointestinal: Positive for nausea and vomiting. Negative for diarrhea.  Neurological: Positive for weakness. Negative for syncope and numbness.  All other systems reviewed and are negative.     Allergies  Review of patient's allergies indicates no known allergies.  Home Medications   Prior to Admission medications   Medication Sig Start Date End Date Taking? Authorizing Provider  aspirin EC 81 MG EC tablet Take 1 tablet (81 mg total) by mouth daily. 04/08/12  Yes Imogene Burn, PA-C  atorvastatin (LIPITOR) 80 MG tablet Take 1 tablet (80 mg total) by mouth daily at 6 PM. Needs appointment 02/28/15  Yes Lelon Perla, MD  carvedilol (COREG) 25 MG tablet Take 1 tablet (25 mg total) by mouth 2 (two) times daily with  a meal. Needs appointment 02/28/15  Yes Lelon Perla, MD  furosemide (LASIX) 40 MG tablet Take 1 tablet by mouth  daily 01/12/15  Yes Thompson Grayer, MD  lisinopril (PRINIVIL,ZESTRIL) 20 MG tablet Take 1 tablet (20 mg total) by mouth daily. Needs appointment 02/28/15  Yes Lelon Perla, MD  Multiple Vitamin (MULTIVITAMIN) capsule Take 1 capsule by mouth daily.     Yes Historical Provider, MD  omega-3 acid ethyl esters (LOVAZA) 1 g capsule Take 1 g by mouth daily.   Yes Historical Provider, MD  potassium chloride SA (K-DUR,KLOR-CON) 20 MEQ tablet Take 1 tablet by mouth  daily 01/12/15  Yes Thompson Grayer, MD  Fish Oil OIL Take 360 mg by mouth daily.    Historical  Provider, MD  nitroGLYCERIN (NITROSTAT) 0.4 MG SL tablet One tablet under tongue every 5 minutes as needed for chest pain--may repeat 3 times     Historical Provider, MD    BP 169/96 mmHg  Pulse 90  Temp(Src) 103 F (39.4 C) (Oral)  Resp 25  Wt 58.968 kg  SpO2 90% Physical Exam  Constitutional: He is oriented to person, place, and time. He appears well-developed and well-nourished. No distress.  HENT:  Head: Normocephalic and atraumatic.  Right Ear: External ear normal.  Left Ear: External ear normal.  Nose: Nose normal.  Mouth/Throat: Uvula is midline, oropharynx is clear and moist and mucous membranes are normal.  Eyes: Conjunctivae, EOM and lids are normal. Pupils are equal, round, and reactive to light. Right eye exhibits no discharge. Left eye exhibits no discharge. No scleral icterus.  Neck: Normal range of motion. Neck supple.  Cardiovascular: Normal rate, regular rhythm, normal heart sounds, intact distal pulses and normal pulses.   Pulmonary/Chest: Effort normal. No respiratory distress. He has wheezes. He has no rales.  Mild wheezing to upper lung fields bilaterally.   Abdominal: Soft. Normal appearance and bowel sounds are normal. He exhibits no distension and no mass. There is no tenderness. There is no rigidity, no rebound and no guarding.  Musculoskeletal: Normal range of motion. He exhibits no edema or tenderness.  Neurological: He is alert and oriented to person, place, and time. He has normal strength. No sensory deficit.  Skin: Skin is warm and intact. No rash noted. He is diaphoretic. No erythema. No pallor.  Psychiatric: He has a normal mood and affect. His speech is normal and behavior is normal.  Nursing note and vitals reviewed.   ED Course  Procedures (including critical care time)  Labs Review Labs Reviewed  COMPREHENSIVE METABOLIC PANEL - Abnormal; Notable for the following:    Glucose, Bld 125 (*)    AST 50 (*)    All other components within normal  limits  CBC WITH DIFFERENTIAL/PLATELET - Abnormal; Notable for the following:    Platelets 124 (*)    All other components within normal limits  URINALYSIS, ROUTINE W REFLEX MICROSCOPIC (NOT AT [facility]) - Abnormal; Notable for the following:    APPearance HAZY (*)    Hgb urine dipstick LARGE (*)    Ketones, ur 15 (*)    Protein, ur 100 (*)    All other components within normal limits  INFLUENZA PANEL BY PCR (TYPE A & B, H1N1) - Abnormal; Notable for the following:    Influenza A By PCR POSITIVE (*)    All other components within normal limits  URINE MICROSCOPIC-ADD ON - Abnormal; Notable for the following:    Squamous Epithelial / LPF 0-5 (*)  Bacteria, UA FEW (*)    Casts HYALINE CASTS (*)    All other components within normal limits  RAPID STREP SCREEN (NOT AT [facility])  C DIFFICILE QUICK SCREEN W PCR REFLEX  CULTURE, BLOOD (ROUTINE X 2)  CULTURE, BLOOD (ROUTINE X 2)  URINE CULTURE  CULTURE, GROUP A STREP ([place])  I-STAT CG4 LACTIC ACID, ED  Randolm Idol, ED    Imaging Review Dg Chest 2 View  03/28/2015  CLINICAL DATA:  Shortness of breath, cough, congestion for 3 days. EXAM: CHEST  2 VIEW COMPARISON:  05/26/2010 FINDINGS: There is hyperinflation of the lungs compatible with COPD. Left pacer remains in place, unchanged. Heart is borderline in size. No confluent airspace opacities or effusions. No acute bony abnormality. IMPRESSION: COPD.  No active disease. Electronically Signed   By: Rolm Baptise M.D.   On: 03/28/2015 11:26   I have personally reviewed and evaluated these images and lab results as part of my medical decision-making.   EKG Interpretation   Date/Time:  Tuesday March 28 2015 10:27:29 EST Ventricular Rate:  92 PR Interval:  150 QRS Duration: 96 QT Interval:  346 QTC Calculation: 427 R Axis:   26 Text Interpretation:  Sinus rhythm with occasional Premature ventricular  complexes Possible Left atrial enlargement Left ventricular hypertrophy  with  repolarization abnormality Abnormal ECG Since last tracing rate  faster Otherwise no significant change Confirmed by KNOTT MD, DANIEL  [id number]) on 03/28/2015 11:21:26 AM      MDM   Final diagnoses:  Shortness of breath  Fever, unspecified fever cause    70 year old male presents with fever, nasal congestion, productive cough, fatigue, and generalized weakness. Notes associated chest pain and shortness of breath.   Patient febrile to 103. Heart RRR. Mild wheezing to upper lung fields bilaterally. No respiratory distress. Abdomen soft, non-tender, non-distended.  CBC negative for leukocytosis or anemia. CMP remarkable for AST 50. UA negative for infection. Rapid strep negative. Lactic acid within normal limits. EKG sinus rhythm, HR 92. Troponin negative. CXR no active disease. Flu test pending.   Hospitalist consulted for admission given new oxygen requirement (patient not on home O2 and requiring O2 in the ED). Spoke with Dr. Marily Memos ([facility]), patient to be admitted for further evaluation and management. Patient discussed with and seen by Dr. Laneta Simmers.   BP 169/96 mmHg  Pulse 90  Temp(Src) 103 F (39.4 C) (Oral)  Resp 25  Wt 58.968 kg  SpO2 90%     Marella Chimes, PA-C 03/28/15 1623  Leo Grosser, MD 03/28/15 2203

## 2015-03-28 NOTE — Progress Notes (Signed)
Benjamin Cook:[id number] Admission Data: 03/28/2015 5:29 PM Attending Provider: Waldemar Dickens, MD  [id number], Hilliard Clark, MD Consults/ Treatment Team:    Benjamin Cook is a 70 y.o. male patient admitted from ED awake, alert  & orientated  X 3,  Full Code, VSS - Blood pressure 175/76, pulse 84, temperature 102.4 F (39.1 C), temperature source Oral, resp. rate 18, weight 58.968 kg (130 lb), SpO2 91 %., O2    2 L nasal cannular,Pt is dyspnea upon exertion, regular once settled into bed. Tele # [phone] placed.    IV site WDL: Right AC with a transparent dsg that's clean dry and intact.  Allergies:  No Known Allergies   Past Medical History  Diagnosis Date  . CAD (coronary artery disease)     a. s/p PCI to LAD 2001 b. myoview 2014 high risk with scar LAD/RCA territory but no ischemia  . Cardiomyopathy, ischemic   . CHF (congestive heart failure) ([name])     class II/III  . HTN (hypertension)   . Hyperlipidemia   . COPD (chronic obstructive pulmonary disease) ([name])     smokes cigars but has quit cigarettes  . PSA (psoriatic arthritis) ([place])     increased  . Adrenal mass ([place])     per pt this is remote (10 years) and benign by biopsy  . Anxiety   . PVD (peripheral vascular disease) ([place])    Pt orientation to unit, room and routine. Information packet given to patient/family and safety video watched.  Admission INP armband ID verified with patient/family, and in place. SR up x 2, fall risk assessment complete with Patient and family verbalizing understanding of risks associated with falls. Pt verbalizes an understanding of how to use the call bell and to call for help before getting out of bed.  Skin, clean-dry- intact without evidence of bruising, or skin tears.   No evidence of skin break down noted on exam. Skin Assessment done with Inocencio Homes.   Will cont to monitor and assist as needed.  Dorita Fray, RN 03/28/2015 5:39 PM

## 2015-03-29 ENCOUNTER — Inpatient Hospital Stay (HOSPITAL_COMMUNITY): Payer: Medicare Other

## 2015-03-29 ENCOUNTER — Encounter (HOSPITAL_COMMUNITY): Payer: Self-pay | Admitting: General Practice

## 2015-03-29 DIAGNOSIS — I5022 Chronic systolic (congestive) heart failure: Secondary | ICD-10-CM

## 2015-03-29 DIAGNOSIS — J9601 Acute respiratory failure with hypoxia: Secondary | ICD-10-CM

## 2015-03-29 DIAGNOSIS — I509 Heart failure, unspecified: Secondary | ICD-10-CM

## 2015-03-29 DIAGNOSIS — I10 Essential (primary) hypertension: Secondary | ICD-10-CM

## 2015-03-29 LAB — CBC
HEMATOCRIT: 43 % (ref 39.0–52.0)
HEMOGLOBIN: 14.5 g/dL (ref 13.0–17.0)
MCH: 30.3 pg (ref 26.0–34.0)
MCHC: 33.7 g/dL (ref 30.0–36.0)
MCV: 89.8 fL (ref 78.0–100.0)
Platelets: 122 10*3/uL — ABNORMAL LOW (ref 150–400)
RBC: 4.79 MIL/uL (ref 4.22–5.81)
RDW: 15 % (ref 11.5–15.5)
WBC: 12.8 10*3/uL — AB (ref 4.0–10.5)

## 2015-03-29 LAB — COMPREHENSIVE METABOLIC PANEL
ALBUMIN: 3.1 g/dL — AB (ref 3.5–5.0)
ALK PHOS: 47 U/L (ref 38–126)
ALT: 31 U/L (ref 17–63)
AST: 75 U/L — ABNORMAL HIGH (ref 15–41)
Anion gap: 13 (ref 5–15)
BILIRUBIN TOTAL: 0.8 mg/dL (ref 0.3–1.2)
BUN: 14 mg/dL (ref 6–20)
CALCIUM: 8.7 mg/dL — AB (ref 8.9–10.3)
CO2: 22 mmol/L (ref 22–32)
CREATININE: 0.85 mg/dL (ref 0.61–1.24)
Chloride: 105 mmol/L (ref 101–111)
GFR calc Af Amer: >60 mL/min (ref >60)
GFR calc non Af Amer: >60 mL/min (ref >60)
GLUCOSE: 132 mg/dL — AB (ref 65–99)
Potassium: 4.1 mmol/L (ref 3.5–5.1)
SODIUM: 140 mmol/L (ref 135–145)
Total Protein: 6.3 g/dL — ABNORMAL LOW (ref 6.5–8.1)

## 2015-03-29 LAB — MAGNESIUM: Magnesium: 1.9 mg/dL (ref 1.7–2.4)

## 2015-03-29 MED ORDER — IPRATROPIUM-ALBUTEROL 0.5-2.5 (3) MG/3ML IN SOLN
3.0000 mL | Freq: Three times a day (TID) | RESPIRATORY_TRACT | Status: DC
  Administered 2015-03-30: 3 mL via RESPIRATORY_TRACT
  Filled 2015-03-29: qty 3

## 2015-03-29 MED ORDER — PERFLUTREN LIPID MICROSPHERE
1.0000 mL | INTRAVENOUS | Status: AC | PRN
  Administered 2015-03-29: 1 mL via INTRAVENOUS
  Filled 2015-03-29: qty 10

## 2015-03-29 NOTE — Care Management Note (Signed)
Case Management Note  Patient Details  Name: MATHEW BRUSTAD MRN: [id number] Date of Birth: [birth year]  Subjective/Objective:                 Spoke with patient and wife in room. Patient is flu +. Lives at home with wife, both drive, deny any obstacles to getting their medications, neither have nor state they need DME. Have PCP and [facility] [place].   Action/Plan:  No CM identified at this time.  Expected Discharge Date:                  Expected Discharge Plan:  Home/Self Care  In-House Referral:     Discharge planning Services  CM Consult  Post Acute Care Choice:    Choice offered to:     DME Arranged:    DME Agency:     HH Arranged:    HH Agency:     Status of Service:  In process, will continue to follow  Medicare Important Message Given:    Date Medicare IM Given:    Medicare IM give by:    Date Additional Medicare IM Given:    Additional Medicare Important Message give by:     If discussed at [place] of Stay Meetings, dates discussed:    Additional Comments:  Carles Collet, RN 03/29/2015, 11:25 AM

## 2015-03-29 NOTE — Progress Notes (Signed)
*  PRELIMINARY RESULTS* Echocardiogram 2D Echocardiogram used Definity has been performed.  Leavy Cella 03/29/2015, 10:29 AM

## 2015-03-29 NOTE — Progress Notes (Signed)
Pt had 23 Beats of V Tach. MD Broadus John aware of incident. Lab orders were placed. Pt is Asymptomatic. No complaints at this time. Dorita Fray 03/29/2015 07:35AM

## 2015-03-29 NOTE — Progress Notes (Signed)
Pt had 5 beats of Vtach. RN text Paged MD to make her aware. Dorita Fray 03/29/2015 2:58 PM

## 2015-03-29 NOTE — Progress Notes (Signed)
[facility] PROGRESS NOTE  YUG JHA [id number] DOB: Jul 05, 1945 DOA: 03/28/2015 PCP: Renato Shin, MD  Assessment/Plan: Acute respiratory failure:  -due to Influenza A and COPD flare -continue prednisone, nebs -Tamiflu -stop IVF, has CHF  -mucinex  COPD  -With mild flare as above -continue Prednisone and duonebs  HTN: - continue lisionpril, Coreg  HLD: - continue statin  CHF: 30% EF by last echo in 2010.  - No evidence of acute decompensation - continue lasix and ACEi, Coreg  DVT proph: lovenox  Code Status: Full Code Family Communication: wife Disposition Plan: Home in 1-2days if better   HPI/Subjective: Feels a little better  Objective: Filed Vitals:   03/29/15 0643 03/29/15 0720  BP: 155/69 157/60  Pulse: 68 70  Temp: 98.7 F (37.1 C) 98.2 F (36.8 C)  Resp: 16 24    Intake/Output Summary (Last 24 hours) at 03/29/15 1022 Last data filed at 03/29/15 0647  Gross per 24 hour  Intake    635 ml  Output    425 ml  Net    210 ml   Filed Weights   03/28/15 1109 03/28/15 1734  Weight: 58.968 kg (130 lb) 62.8 kg (138 lb 7.2 oz)    Exam:   General:  AAOx3  Cardiovascular: S1S2/RRR  Respiratory: scattered ronchi, poor air movt  Abdomen: soft, NT, BS present  Musculoskeletal:  No edemA   Data Reviewed: Basic Metabolic Panel:  Recent Labs Lab 03/28/15 1105 03/29/15 0652  NA 139 140  K 4.0 4.1  CL 103 105  CO2 23 22  GLUCOSE 125* 132*  BUN 10 14  CREATININE 1.05 0.85  CALCIUM 9.3 8.7*  MG  --  1.9   Liver Function Tests:  Recent Labs Lab 03/28/15 1105 03/29/15 0652  AST 50* 75*  ALT 28 31  ALKPHOS 60 47  BILITOT 0.7 0.8  PROT 7.5 6.3*  ALBUMIN 3.9 3.1*   No results for input(s): LIPASE, AMYLASE in the last 168 hours. No results for input(s): AMMONIA in the last 168 hours. CBC:  Recent Labs Lab 03/28/15 1105 03/29/15 0652  WBC 9.0 12.8*  NEUTROABS 7.2  --   HGB 14.6 14.5  HCT 43.8 43.0  MCV 91.6  89.8  PLT 124* 122*   Cardiac Enzymes: No results for input(s): CKTOTAL, CKMB, CKMBINDEX, TROPONINI in the last 168 hours. BNP (last 3 results) No results for input(s): BNP in the last 8760 hours.  ProBNP (last 3 results) No results for input(s): PROBNP in the last 8760 hours.  CBG: No results for input(s): GLUCAP in the last 168 hours.  Recent Results (from the past 240 hour(s))  Rapid strep screen     Status: None   Collection Time: 03/28/15 11:42 AM  Result Value Ref Range Status   Streptococcus, Group A Screen (Direct) NEGATIVE NEGATIVE Final    Comment: (NOTE) A Rapid Antigen test may result negative if the antigen level in the sample is below the detection level of this test. The FDA has not cleared this test as a stand-alone test therefore the rapid antigen negative result has reflexed to a Group A Strep culture.   C difficile quick scan w PCR reflex     Status: None   Collection Time: 03/28/15  1:35 PM  Result Value Ref Range Status   C Diff antigen NEGATIVE NEGATIVE Final   C Diff toxin NEGATIVE NEGATIVE Final   C Diff interpretation Negative for toxigenic C. difficile  Final     Studies:  Dg Chest 2 View  03/28/2015  CLINICAL DATA:  Shortness of breath, cough, congestion for 3 days. EXAM: CHEST  2 VIEW COMPARISON:  05/26/2010 FINDINGS: There is hyperinflation of the lungs compatible with COPD. Left pacer remains in place, unchanged. Heart is borderline in size. No confluent airspace opacities or effusions. No acute bony abnormality. IMPRESSION: COPD.  No active disease. Electronically Signed   By: Rolm Baptise M.D.   On: 03/28/2015 11:26    Scheduled Meds: . aspirin EC  81 mg Oral Daily  . atorvastatin  80 mg Oral q1800  . carvedilol  25 mg Oral BID WC  . enoxaparin (LOVENOX) injection  40 mg Subcutaneous Q24H  . furosemide  40 mg Oral Daily  . guaiFENesin  1,200 mg Oral BID  . ipratropium-albuterol  3 mL Nebulization QID  . lisinopril  20 mg Oral Daily  .  oseltamivir  75 mg Oral BID  . potassium chloride SA  20 mEq Oral Daily  . predniSONE  60 mg Oral Q breakfast  . sodium chloride flush  3 mL Intravenous Q12H   Continuous Infusions: . sodium chloride 50 mL/hr at 03/29/15 0708   Antibiotics Given (last 72 hours)    Date/Time Action Medication Dose   03/28/15 2138 Given   oseltamivir (TAMIFLU) capsule 75 mg 75 mg   03/29/15 0912 Given   oseltamivir (TAMIFLU) capsule 75 mg 75 mg      Active Problems:   HLD (hyperlipidemia)   Acute respiratory failure ([place])   Essential hypertension   Systolic CHF ([place])    Time spent: 16min    Lorita Forinash  [facility] Pager [phone]. If 7PM-7AM, please contact night-coverage at [email or web address], password [facility] 03/29/2015, 10:22 AM  LOS: 1 day

## 2015-03-29 NOTE — Progress Notes (Signed)
Notified on-call physician Schorr that pt had a 9-beat run of V-tach. Pt was asymptomatic. Will continue to monitor and treat per MD orders.

## 2015-03-30 DIAGNOSIS — R509 Fever, unspecified: Secondary | ICD-10-CM

## 2015-03-30 LAB — BASIC METABOLIC PANEL
Anion gap: 13 (ref 5–15)
BUN: 16 mg/dL (ref 6–20)
CHLORIDE: 105 mmol/L (ref 101–111)
CO2: 22 mmol/L (ref 22–32)
Calcium: 8.8 mg/dL — ABNORMAL LOW (ref 8.9–10.3)
Creatinine, Ser: 0.8 mg/dL (ref 0.61–1.24)
GFR calc non Af Amer: >60 mL/min (ref >60)
Glucose, Bld: 133 mg/dL — ABNORMAL HIGH (ref 65–99)
POTASSIUM: 3.8 mmol/L (ref 3.5–5.1)
SODIUM: 140 mmol/L (ref 135–145)

## 2015-03-30 LAB — CBC
HEMATOCRIT: 45 % (ref 39.0–52.0)
HEMOGLOBIN: 15.2 g/dL (ref 13.0–17.0)
MCH: 30.5 pg (ref 26.0–34.0)
MCHC: 33.8 g/dL (ref 30.0–36.0)
MCV: 90.2 fL (ref 78.0–100.0)
Platelets: 144 10*3/uL — ABNORMAL LOW (ref 150–400)
RBC: 4.99 MIL/uL (ref 4.22–5.81)
RDW: 15.1 % (ref 11.5–15.5)
WBC: 11.8 10*3/uL — AB (ref 4.0–10.5)

## 2015-03-30 LAB — CULTURE, GROUP A STREP (THRC)

## 2015-03-30 LAB — URINE CULTURE

## 2015-03-30 LAB — PROCALCITONIN: PROCALCITONIN: 1.4 ng/mL

## 2015-03-30 MED ORDER — OSELTAMIVIR PHOSPHATE 75 MG PO CAPS: 75.0000 mg | ORAL_CAPSULE | Freq: Two times a day (BID) | ORAL | Status: DC

## 2015-03-30 MED ORDER — PREDNISONE 20 MG PO TABS: 20.0000 mg | ORAL_TABLET | Freq: Every day | ORAL | Status: DC

## 2015-03-30 NOTE — Progress Notes (Signed)
Patient discharge teaching given, including activity, diet, follow-up appoints, and medications. Patient verbalized understanding of all discharge instructions. IV access was d/c'd. Vitals are stable. Skin is intact except as charted in most recent assessments. Pt to be escorted out by NT, to be driven home by family. 

## 2015-03-30 NOTE — Discharge Summary (Signed)
Physician Discharge Summary  Benjamin Cook [id number] DOB: [birth year] DOA: 03/28/2015  PCP: Renato Shin, MD  Admit date: 03/28/2015 Discharge date: 03/30/2015  Time spent: 45 minutes  Recommendations for Outpatient Follow-up:  1. PCP Dr.Ellison 04/10/15   Discharge Diagnoses:    Influenza A   HLD (hyperlipidemia)   Acute respiratory failure ([name])   Essential hypertension   Chronic Systolic CHF [facility])   Discharge Condition: stable  Diet recommendation: low sodium, heart healthy  Filed Weights   03/28/15 1109 03/28/15 1734  Weight: 58.968 kg (130 lb) 62.8 kg (138 lb 7.2 oz)    History of present illness:  Chief Complaint: Fever, cough weakness HPI: Benjamin Cook is a 70 y.o. male presented with Fever, nasal congestion, productive cough, fatigue and general malaise. Started approximately 2 days ago. Associated with some mild chest discomfort and shortness of breath.  Patient got his flu shot in October.   Hospital Course:  Acute respiratory failure:  -due to Influenza A and COPD flare -treated with Tamiflu, steroids, nebs -much improved, discharged home on Tamiflu for 3 more days, prednisone taper  COPD  -With mild flare as above -improved -continue Prednisone taper and duonebs  HTN: - stable, continue lisionpril, Coreg  HLD: - continue statin  Chronic systolic CHF:  - EF [id number] by last echo in 2010.  - No evidence of acute decompensation - continue PO lasix and ACEi, Coreg  Discharge Exam: Filed Vitals:   03/30/15 0506 03/30/15 0933  BP: 157/87 165/86  Pulse: 72 79  Temp: 98.6 F (37 C)   Resp: 16 16    General: AAOx3 Cardiovascular: S1S2/RRR Respiratory: CTAB  Discharge Instructions   Discharge Instructions    Diet - low sodium heart healthy    Complete by:  As directed      Increase activity slowly    Complete by:  As directed           Discharge Medication List as of 03/30/2015 12:08 PM    START taking these medications    Details  oseltamivir (TAMIFLU) 75 MG capsule Take 1 capsule (75 mg total) by mouth 2 (two) times daily. For 3days, Starting 03/30/2015, Until Discontinued, Print    predniSONE (DELTASONE) 20 MG tablet Take 1-2 tablets (20-40 mg total) by mouth daily with breakfast. Take 40mg  for 2days then 20mg  for 2days then STOP, Starting 03/30/2015, Until Discontinued, Print      CONTINUE these medications which have NOT CHANGED   Details  aspirin EC 81 MG EC tablet Take 1 tablet (81 mg total) by mouth daily., Starting 04/08/2012, Until Discontinued, No Print    atorvastatin (LIPITOR) 80 MG tablet Take 1 tablet (80 mg total) by mouth daily at 6 PM. Needs appointment, Starting 02/28/2015, Until Discontinued, Normal    carvedilol (COREG) 25 MG tablet Take 1 tablet (25 mg total) by mouth 2 (two) times daily with a meal. Needs appointment, Starting 02/28/2015, Until Discontinued, Normal    furosemide (LASIX) 40 MG tablet Take 1 tablet by mouth  daily, Normal    lisinopril (PRINIVIL,ZESTRIL) 20 MG tablet Take 1 tablet (20 mg total) by mouth daily. Needs appointment, Starting 02/28/2015, Until Discontinued, Normal    Multiple Vitamin (MULTIVITAMIN) capsule Take 1 capsule by mouth daily.  , Until Discontinued, Historical Med    omega-3 acid ethyl esters (LOVAZA) 1 g capsule Take 1 g by mouth daily., Until Discontinued, Historical Med    potassium chloride SA (K-DUR,KLOR-CON) 20 MEQ tablet Take 1 tablet by mouth  daily, Normal    Fish Oil OIL Take 360 mg by mouth daily., Until Discontinued, Historical Med    nitroGLYCERIN (NITROSTAT) 0.4 MG SL tablet One tablet under tongue every 5 minutes as needed for chest pain--may repeat 3 times , Until Discontinued, Historical Med       No Known Allergies Follow-up Information    Follow up with Renato Shin, MD. Schedule an appointment as soon as possible for a visit on 04/10/2015.   Specialty:  Endocrinology   Why:  Appointment with Dr. Loanne Drilling is on 04/10/15 at 11:15    Contact information:   [address] [place] [address] [phone]        The results of significant diagnostics from this hospitalization (including imaging, microbiology, ancillary and laboratory) are listed below for reference.    Significant Diagnostic Studies: Dg Chest 2 View  03/28/2015  CLINICAL DATA:  Shortness of breath, cough, congestion for 3 days. EXAM: CHEST  2 VIEW COMPARISON:  05/26/2010 FINDINGS: There is hyperinflation of the lungs compatible with COPD. Left pacer remains in place, unchanged. Heart is borderline in size. No confluent airspace opacities or effusions. No acute bony abnormality. IMPRESSION: COPD.  No active disease. Electronically Signed   By: Rolm Baptise M.D.   On: 03/28/2015 11:26    Microbiology: Recent Results (from the past 240 hour(s))  Culture, blood (routine x 2)     Status: None (Preliminary result)   Collection Time: 03/28/15 11:05 AM  Result Value Ref Range Status   Specimen Description BLOOD RIGHT ANTECUBITAL  Final   Special Requests BOTTLES DRAWN AEROBIC AND ANAEROBIC 5CC  Final   Culture NO GROWTH 2 DAYS  Final   Report Status PENDING  Incomplete  Culture, blood (routine x 2)     Status: None (Preliminary result)   Collection Time: 03/28/15 11:08 AM  Result Value Ref Range Status   Specimen Description BLOOD LEFT HAND  Final   Special Requests BOTTLES DRAWN AEROBIC ONLY 8CC  Final   Culture NO GROWTH 2 DAYS  Final   Report Status PENDING  Incomplete  Urine culture     Status: None   Collection Time: 03/28/15 11:42 AM  Result Value Ref Range Status   Specimen Description URINE, RANDOM  Final   Special Requests NONE  Final   Culture MULTIPLE SPECIES PRESENT, SUGGEST RECOLLECTION  Final   Report Status 03/30/2015 FINAL  Final  Rapid strep screen     Status: None   Collection Time: 03/28/15 11:42 AM  Result Value Ref Range Status   Streptococcus, Group A Screen (Direct) NEGATIVE NEGATIVE Final    Comment:  (NOTE) A Rapid Antigen test may result negative if the antigen level in the sample is below the detection level of this test. The FDA has not cleared this test as a stand-alone test therefore the rapid antigen negative result has reflexed to a Group A Strep culture.   Culture, group A strep     Status: None (Preliminary result)   Collection Time: 03/28/15 11:42 AM  Result Value Ref Range Status   Specimen Description THROAT  Final   Special Requests NONE Reflexed from [id number]  Final   Culture CULTURE REINCUBATED FOR BETTER GROWTH  Final   Report Status PENDING  Incomplete  C difficile quick scan w PCR reflex     Status: None   Collection Time: 03/28/15  1:35 PM  Result Value Ref Range Status   C Diff antigen NEGATIVE NEGATIVE Final  C Diff toxin NEGATIVE NEGATIVE Final   C Diff interpretation Negative for toxigenic C. difficile  Final     Labs: Basic Metabolic Panel:  Recent Labs Lab 03/28/15 1105 03/29/15 0652 03/30/15 0452  NA 139 140 140  K 4.0 4.1 3.8  CL 103 105 105  CO2 23 22 22   GLUCOSE 125* 132* 133*  BUN 10 14 16   CREATININE 1.05 0.85 0.80  CALCIUM 9.3 8.7* 8.8*  MG  --  1.9  --    Liver Function Tests:  Recent Labs Lab 03/28/15 1105 03/29/15 0652  AST 50* 75*  ALT 28 31  ALKPHOS 60 47  BILITOT 0.7 0.8  PROT 7.5 6.3*  ALBUMIN 3.9 3.1*   No results for input(s): LIPASE, AMYLASE in the last 168 hours. No results for input(s): AMMONIA in the last 168 hours. CBC:  Recent Labs Lab 03/28/15 1105 03/29/15 0652 03/30/15 0452  WBC 9.0 12.8* 11.8*  NEUTROABS 7.2  --   --   HGB 14.6 14.5 15.2  HCT 43.8 43.0 45.0  MCV 91.6 89.8 90.2  PLT 124* 122* 144*   Cardiac Enzymes: No results for input(s): CKTOTAL, CKMB, CKMBINDEX, TROPONINI in the last 168 hours. BNP: BNP (last 3 results) No results for input(s): BNP in the last 8760 hours.  ProBNP (last 3 results) No results for input(s): PROBNP in the last 8760 hours.  CBG: No results for input(s):  GLUCAP in the last 168 hours.     SignedDomenic Polite MD.  [facility] 03/30/2015, 1:55 PM

## 2015-04-02 LAB — CULTURE, BLOOD (ROUTINE X 2)
Culture: NO GROWTH
Culture: NO GROWTH

## 2015-04-05 ENCOUNTER — Telehealth: Payer: Self-pay | Admitting: Endocrinology

## 2015-04-05 NOTE — Telephone Encounter (Signed)
PT wife called and said he has taken all of his medications he was given for the flu and is still not feeling well and wants to know what else to do.

## 2015-04-05 NOTE — Telephone Encounter (Signed)
I contacted the pt's wife and advised of note below. Pt declined to move up the appointment and will keep his appointment 04/10/2015.

## 2015-04-05 NOTE — Telephone Encounter (Signed)
See note below. This pt has not had an appointment in a while and is scheduled for 04/10/2015. Thanks!

## 2015-04-05 NOTE — Telephone Encounter (Signed)
Please offer to move up appt to this week

## 2015-04-10 ENCOUNTER — Ambulatory Visit (INDEPENDENT_AMBULATORY_CARE_PROVIDER_SITE_OTHER): Payer: Medicare Other | Admitting: Endocrinology

## 2015-04-10 ENCOUNTER — Encounter: Payer: Self-pay | Admitting: Endocrinology

## 2015-04-10 VITALS — BP 136/78 | HR 78 | Temp 97.7°F | Ht 71.0 in | Wt 138.0 lb

## 2015-04-10 DIAGNOSIS — R7309 Other abnormal glucose: Secondary | ICD-10-CM | POA: Diagnosis not present

## 2015-04-10 DIAGNOSIS — I5022 Chronic systolic (congestive) heart failure: Secondary | ICD-10-CM

## 2015-04-10 DIAGNOSIS — R0600 Dyspnea, unspecified: Secondary | ICD-10-CM

## 2015-04-10 DIAGNOSIS — R634 Abnormal weight loss: Secondary | ICD-10-CM | POA: Diagnosis not present

## 2015-04-10 DIAGNOSIS — E785 Hyperlipidemia, unspecified: Secondary | ICD-10-CM

## 2015-04-10 DIAGNOSIS — J449 Chronic obstructive pulmonary disease, unspecified: Secondary | ICD-10-CM

## 2015-04-10 DIAGNOSIS — R972 Elevated prostate specific antigen [PSA]: Secondary | ICD-10-CM | POA: Diagnosis not present

## 2015-04-10 LAB — CBC WITH DIFFERENTIAL/PLATELET
BASOS PCT: 0.3 % (ref 0.0–3.0)
Basophils Absolute: 0 10*3/uL (ref 0.0–0.1)
EOS ABS: 0 10*3/uL (ref 0.0–0.7)
Eosinophils Relative: 0.3 % (ref 0.0–5.0)
HCT: 42.5 % (ref 39.0–52.0)
HEMOGLOBIN: 14.3 g/dL (ref 13.0–17.0)
LYMPHS ABS: 1.7 10*3/uL (ref 0.7–4.0)
Lymphocytes Relative: 13.5 % (ref 12.0–46.0)
MCHC: 33.6 g/dL (ref 30.0–36.0)
MCV: 91 fl (ref 78.0–100.0)
MONO ABS: 1.6 10*3/uL — AB (ref 0.1–1.0)
Monocytes Relative: 13 % — ABNORMAL HIGH (ref 3.0–12.0)
NEUTROS PCT: 72.9 % (ref 43.0–77.0)
Neutro Abs: 9 10*3/uL — ABNORMAL HIGH (ref 1.4–7.7)
PLATELETS: 331 10*3/uL (ref 150.0–400.0)
RBC: 4.67 Mil/uL (ref 4.22–5.81)
RDW: 14.9 % (ref 11.5–15.5)
WBC: 12.4 10*3/uL — AB (ref 4.0–10.5)

## 2015-04-10 LAB — BASIC METABOLIC PANEL
BUN: 9 mg/dL (ref 6–23)
CHLORIDE: 104 meq/L (ref 96–112)
CO2: 27 meq/L (ref 19–32)
CREATININE: 0.67 mg/dL (ref 0.40–1.50)
Calcium: 8.6 mg/dL (ref 8.4–10.5)
GFR: 150.11 mL/min (ref >60.00)
Glucose, Bld: 124 mg/dL — ABNORMAL HIGH (ref 70–99)
POTASSIUM: 3.9 meq/L (ref 3.5–5.1)
Sodium: 138 mEq/L (ref 135–145)

## 2015-04-10 LAB — LIPID PANEL
CHOL/HDL RATIO: 4
Cholesterol: 108 mg/dL (ref 0–200)
HDL: 26.5 mg/dL — ABNORMAL LOW (ref >39.00)
LDL CALC: 66 mg/dL (ref 0–99)
NONHDL: 81.5
Triglycerides: 78 mg/dL (ref 0.0–149.0)
VLDL: 15.6 mg/dL (ref 0.0–40.0)

## 2015-04-10 LAB — URINALYSIS, ROUTINE W REFLEX MICROSCOPIC
Bilirubin Urine: NEGATIVE
KETONES UR: NEGATIVE
Leukocytes, UA: NEGATIVE
Nitrite: NEGATIVE
PH: 5.5 (ref 5.0–8.0)
SPECIFIC GRAVITY, URINE: 1.01 (ref 1.000–1.030)
TOTAL PROTEIN, URINE-UPE24: NEGATIVE
URINE GLUCOSE: NEGATIVE
UROBILINOGEN UA: 0.2 (ref 0.0–1.0)

## 2015-04-10 LAB — HEPATIC FUNCTION PANEL
ALT: 17 U/L (ref 0–53)
AST: 19 U/L (ref 0–37)
Albumin: 3 g/dL — ABNORMAL LOW (ref 3.5–5.2)
Alkaline Phosphatase: 45 U/L (ref 39–117)
BILIRUBIN DIRECT: 0.1 mg/dL (ref 0.0–0.3)
TOTAL PROTEIN: 6.2 g/dL (ref 6.0–8.3)
Total Bilirubin: 0.6 mg/dL (ref 0.2–1.2)

## 2015-04-10 LAB — HEMOGLOBIN A1C: Hgb A1c MFr Bld: 6.5 % (ref 4.6–6.5)

## 2015-04-10 LAB — TSH: TSH: 0.58 u[IU]/mL (ref 0.35–4.50)

## 2015-04-10 LAB — PSA: PSA: 11.53 ng/mL — ABNORMAL HIGH (ref 0.10–4.00)

## 2015-04-10 MED ORDER — FLUTICASONE-SALMETEROL 100-50 MCG/DOSE IN AEPB: 1.0000 | INHALATION_SPRAY | Freq: Two times a day (BID) | RESPIRATORY_TRACT | Status: DC

## 2015-04-10 MED ORDER — CEFUROXIME AXETIL 250 MG PO TABS: 250.0000 mg | ORAL_TABLET | Freq: Two times a day (BID) | ORAL | Status: AC

## 2015-04-10 NOTE — Progress Notes (Signed)
Subjective:    Patient ID: Benjamin Cook, male    DOB: Aug 01, 1945, 70 y.o.   MRN: [id number]  HPI He has 1 month of prod-quality cough in the chest, and assoc generalized weakness.  He was recently in the hospital.   Past Medical History  Diagnosis Date  . CAD (coronary artery disease)     a. s/p PCI to LAD 2001 b. myoview 2014 high risk with scar LAD/RCA territory but no ischemia  . Cardiomyopathy, ischemic   . CHF (congestive heart failure) ([name])     class II/III  . HTN (hypertension)   . Hyperlipidemia   . COPD (chronic obstructive pulmonary disease) ([name])     smokes cigars but has quit cigarettes  . PSA (psoriatic arthritis) ([place])     increased  . Adrenal mass ([place])     per pt this is remote (10 years) and benign by biopsy  . Anxiety   . PVD (peripheral vascular disease) ([place])   . Flu 03/2015    Past Surgical History  Procedure Laterality Date  . Coronary stent placement      a. PCI to LAD 2001  . Cataract surgery    . Implantable cardioverter defibrillator (icd) generator change N/A 03/18/2014    a. SJM Fortify ST DR ICD implanted by [facility] for primary prevention b. gen change 03/2014   . Left heart catheterization with coronary angiogram N/A 05/27/2014    Procedure: LEFT HEART CATHETERIZATION WITH CORONARY ANGIOGRAM;  Surgeon: Sherren Mocha, MD;  Location: [facility] CATH LAB;  Service: Cardiovascular;  Laterality: N/A;    Social History   Social History  . Marital Status: Married    Spouse Name: N/A  . Number of Children: N/A  . Years of Education: N/A   Occupational History  . Lawn care    Social History Main Topics  . Smoking status: Current Every Day Smoker    Types: Cigars  . Smokeless tobacco: Never Used     Comment: smokes cigars now, no longer smokes cigarettes but previously smoked 2ppd  . Alcohol Use: No  . Drug Use: No  . Sexual Activity: Not on file   Other Topics Concern  . Not on file   Social History Narrative    Current Outpatient  Prescriptions on File Prior to Visit  Medication Sig Dispense Refill  . aspirin EC 81 MG EC tablet Take 1 tablet (81 mg total) by mouth daily. 30 tablet   . atorvastatin (LIPITOR) 80 MG tablet Take 1 tablet (80 mg total) by mouth daily at 6 PM. Needs appointment 30 tablet 0  . carvedilol (COREG) 25 MG tablet Take 1 tablet (25 mg total) by mouth 2 (two) times daily with a meal. Needs appointment 60 tablet 0  . Fish Oil OIL Take 360 mg by mouth daily.    . furosemide (LASIX) 40 MG tablet Take 1 tablet by mouth  daily 90 tablet 0  . lisinopril (PRINIVIL,ZESTRIL) 20 MG tablet Take 1 tablet (20 mg total) by mouth daily. Needs appointment 30 tablet 0  . Multiple Vitamin (MULTIVITAMIN) capsule Take 1 capsule by mouth daily.      Marland Kitchen omega-3 acid ethyl esters (LOVAZA) 1 g capsule Take 1 g by mouth daily.    . potassium chloride SA (K-DUR,KLOR-CON) 20 MEQ tablet Take 1 tablet by mouth  daily 90 tablet 0   No current facility-administered medications on file prior to visit.    No Known Allergies  Family History  Problem Relation Age  of Onset  . Cancer Neg Hx   . Cirrhosis Mother     due to ETOH    BP 136/78 mmHg  Pulse 78  Temp(Src) 97.7 F (36.5 C) (Oral)  Ht 5\' 11"  (1.803 m)  Wt 138 lb (62.596 kg)  BMI 19.26 kg/m2  SpO2 94%  Review of Systems Denies chest pain. He says doe is the same as it is chronically.  He has lost weight.     Objective:   Physical Exam VITAL SIGNS:  See vs page GENERAL: no distress LUNGS:  Clear to auscultation, except for rales at the left base.  Gait: normal and steady.   Lab Results  Component Value Date   HGBA1C 6.5 04/10/2015   Lab Results  Component Value Date   TSH 0.58 04/10/2015   Lab Results  Component Value Date   WBC 12.4* 04/10/2015   HGB 14.3 04/10/2015   HCT 42.5 04/10/2015   MCV 91.0 04/10/2015   PLT 331.0 04/10/2015   Lab Results  Component Value Date   CREATININE 0.67 04/10/2015   BUN 9 04/10/2015   NA 138 04/10/2015   K  3.9 04/10/2015   CL 104 04/10/2015   CO2 27 04/10/2015    i personally reviewed spirometry tracing (today): Indication: sob Impression: severe COPD    Assessment & Plan:  Severe copd, new. DM: no medication is needed now.  CHF, overdue for cardiol f/u.  Acute bronchitis, persistent.   Patient is advised the following: Patient Instructions  blood tests and a chest x-ray, are requested for you today.  We'll let you know about the results. Please go back to see the heart specialist.  you will receive a phone call, about a day and time for an appointment. Also, please see a lung specialist.  you will receive a phone call, about a day and time for an appointment. i have sent 2 prescriptions to your pharmacy: for an antibiotic pill, and inhaler  Please come back for a regular physical appointment in 2-4 weeks.

## 2015-04-10 NOTE — Patient Instructions (Addendum)
blood tests and a chest x-ray, are requested for you today.  We'll let you know about the results. Please go back to see the heart specialist.  you will receive a phone call, about a day and time for an appointment. Also, please see a lung specialist.  you will receive a phone call, about a day and time for an appointment. i have sent 2 prescriptions to your pharmacy: for an antibiotic pill, and inhaler  Please come back for a regular physical appointment in 2-4 weeks.

## 2015-04-21 ENCOUNTER — Encounter: Payer: Self-pay | Admitting: Endocrinology

## 2015-04-25 ENCOUNTER — Other Ambulatory Visit: Payer: Self-pay | Admitting: Cardiology

## 2015-04-25 NOTE — Telephone Encounter (Signed)
REFILL 

## 2015-05-02 ENCOUNTER — Ambulatory Visit (INDEPENDENT_AMBULATORY_CARE_PROVIDER_SITE_OTHER): Payer: Medicare Other | Admitting: Endocrinology

## 2015-05-02 ENCOUNTER — Other Ambulatory Visit: Payer: Self-pay | Admitting: Internal Medicine

## 2015-05-02 ENCOUNTER — Encounter: Payer: Self-pay | Admitting: Endocrinology

## 2015-05-02 VITALS — BP 134/80 | HR 72 | Temp 97.8°F | Ht 71.0 in | Wt 141.0 lb

## 2015-05-02 DIAGNOSIS — Z23 Encounter for immunization: Secondary | ICD-10-CM

## 2015-05-02 NOTE — Progress Notes (Signed)
we discussed code status.  pt requests full code, but would not want to be started or maintained on artificial life-support measures if there was not a reasonable chance of recovery 

## 2015-05-02 NOTE — Progress Notes (Signed)
Subjective:    Patient ID: Benjamin Cook, male    DOB: 04/26/1945, 70 y.o.   MRN: [id number]  HPI Pt is here for regular wellness examination, and is feeling pretty well in general, and says chronic med probs are stable, except as noted below Past Medical History  Diagnosis Date  . CAD (coronary artery disease)     a. s/p PCI to LAD 2001 b. myoview 2014 high risk with scar LAD/RCA territory but no ischemia  . Cardiomyopathy, ischemic   . CHF (congestive heart failure) ([name])     class II/III  . HTN (hypertension)   . Hyperlipidemia   . COPD (chronic obstructive pulmonary disease) ([name])     smokes cigars but has quit cigarettes  . PSA (psoriatic arthritis) ([place])     increased  . Adrenal mass ([place])     per pt this is remote (10 years) and benign by biopsy  . Anxiety   . PVD (peripheral vascular disease) ([place])   . Flu 03/2015    Past Surgical History  Procedure Laterality Date  . Coronary stent placement      a. PCI to LAD 2001  . Cataract surgery    . Implantable cardioverter defibrillator (icd) generator change N/A 03/18/2014    a. SJM Fortify ST DR ICD implanted by [facility] for primary prevention b. gen change 03/2014   . Left heart catheterization with coronary angiogram N/A 05/27/2014    Procedure: LEFT HEART CATHETERIZATION WITH CORONARY ANGIOGRAM;  Surgeon: Sherren Mocha, MD;  Location[facility] CATH LAB;  Service: Cardiovascular;  Laterality: N/A;    Social History   Social History  . Marital Status: Married    Spouse Name: N/A  . Number of Children: N/A  . Years of Education: N/A   Occupational History  . Lawn care    Social History Main Topics  . Smoking status: Current Every Day Smoker    Types: Cigars  . Smokeless tobacco: Never Used     Comment: smokes cigars now, no longer smokes cigarettes but previously smoked 2ppd  . Alcohol Use: No  . Drug Use: No  . Sexual Activity: Not on file   Other Topics Concern  . Not on file   Social History Narrative     Current Outpatient Prescriptions on File Prior to Visit  Medication Sig Dispense Refill  . aspirin EC 81 MG EC tablet Take 1 tablet (81 mg total) by mouth daily. 30 tablet   . atorvastatin (LIPITOR) 80 MG tablet Take 1 tablet (80 mg total) by mouth daily at 6 PM. 90 tablet 0  . carvedilol (COREG) 25 MG tablet Take 1 tablet (25 mg total) by mouth 2 (two) times daily with a meal. 160 tablet 0  . Fish Oil OIL Take 360 mg by mouth daily.    . Fluticasone-Salmeterol (ADVAIR) 100-50 MCG/DOSE AEPB Inhale 1 puff into the lungs 2 (two) times daily. 1 each 3  . lisinopril (PRINIVIL,ZESTRIL) 20 MG tablet Take 1 tablet (20 mg total) by mouth daily. Needs appointment 30 tablet 0  . Multiple Vitamin (MULTIVITAMIN) capsule Take 1 capsule by mouth daily.      Marland Kitchen omega-3 acid ethyl esters (LOVAZA) 1 g capsule Take 1 g by mouth daily.     No current facility-administered medications on file prior to visit.    No Known Allergies  Family History  Problem Relation Age of Onset  . Cancer Neg Hx   . Cirrhosis Mother     due to  ETOH    BP 134/80 mmHg  Pulse 72  Temp(Src) 97.8 F (36.6 C) (Oral)  Ht 5\' 11"  (1.803 m)  Wt 141 lb (63.957 kg)  BMI 19.67 kg/m2  SpO2 95%  Review of Systems  Constitutional: Negative for fever and unexpected weight change.  HENT: Negative for hearing loss.   Eyes: Negative for visual disturbance.  Respiratory: Negative for cough.   Cardiovascular: Negative for chest pain.  Gastrointestinal: Negative for anal bleeding.  Endocrine: Negative for cold intolerance.  Genitourinary: Negative for hematuria and difficulty urinating.  Musculoskeletal: Positive for back pain.  Skin: Negative for rash.  Allergic/Immunologic: Negative for environmental allergies.  Neurological: Negative for syncope and numbness.  Hematological: Does not bruise/bleed easily.  Psychiatric/Behavioral: Negative for dysphoric mood.      Objective:   Physical Exam VS: see vs page GEN: no  distress HEAD: head: no deformity eyes: no periorbital swelling, no proptosis external nose and ears are normal mouth: no lesion seen NECK: supple, thyroid is not enlarged CHEST WALL: no deformity LUNGS: clear to auscultation BREASTS:  No gynecomastia CV: reg rate and rhythm, no murmur ABD: abdomen is soft, nontender.  no hepatosplenomegaly.  not distended.  no hernia GENITALIA/RECTAL/PROSTATE: sees urology.   MUSCULOSKELETAL: muscle bulk and strength are grossly normal.  no obvious joint swelling.  gait is normal and steady EXTEMITIES: no deformity.  no ulcer on the feet.  feet are of normal color and temp.  no edema PULSES: dorsalis pedis intact bilat.  no carotid bruit NEURO:  cn 2-12 grossly intact.   readily moves all 4's.  sensation is intact to touch on the feet SKIN:  Normal texture and temperature.  No rash or suspicious lesion is visible.   NODES:  None palpable at the neck PSYCH: alert, well-oriented.  Does not appear anxious nor depressed.      Assessment & Plan:  Wellness visit today, with problems stable, except as noted.   Patient is advised the following: Patient Instructions  please consider these measures for your health:  minimize alcohol.  do not use tobacco products.  have a colonoscopy at least every 10 years from age 10.  keep firearms safely stored.  always use seat belts.  have working smoke alarms in your home.  see an eye doctor and dentist regularly.  never drive under the influence of alcohol or drugs (including prescription drugs).   please let me know what your wishes would be, if artificial life support measures should become necessary.  it is critically important to prevent falling down (keep floor areas well-lit, dry, and free of loose objects.  If you have a cane, walker, or wheelchair, you should use it, even for short trips around the house.  Wear flat-soled shoes.  Also, try not to rush) Please come back for a follow-up appointment in 6 months.

## 2015-05-02 NOTE — Patient Instructions (Signed)
please consider these measures for your health:  minimize alcohol.  do not use tobacco products.  have a colonoscopy at least every 10 years from age 72.  keep firearms safely stored.  always use seat belts.  have working smoke alarms in your home.  see an eye doctor and dentist regularly.  never drive under the influence of alcohol or drugs (including prescription drugs).   please let me know what your wishes would be, if artificial life support measures should become necessary.  it is critically important to prevent falling down (keep floor areas well-lit, dry, and free of loose objects.  If you have a cane, walker, or wheelchair, you should use it, even for short trips around the house.  Wear flat-soled shoes.  Also, try not to rush) Please come back for a follow-up appointment in 6 months.

## 2015-05-09 ENCOUNTER — Institutional Professional Consult (permissible substitution): Payer: Medicare Other | Admitting: Pulmonary Disease

## 2015-06-02 ENCOUNTER — Telehealth: Payer: Self-pay | Admitting: Cardiology

## 2015-06-02 NOTE — Telephone Encounter (Signed)
Spoke w/ pt wife and she is going to have pt call me back on Monday 06-05-15.

## 2015-06-12 ENCOUNTER — Telehealth: Payer: Self-pay | Admitting: Cardiology

## 2015-06-12 ENCOUNTER — Encounter: Payer: Self-pay | Admitting: Cardiology

## 2015-06-12 NOTE — Telephone Encounter (Signed)
Spoke w/ pt wife and requested that she have pt send a remote transmission b/c his home monitor has not updated in at least 8 days.

## 2015-06-22 ENCOUNTER — Other Ambulatory Visit: Payer: Self-pay | Admitting: Cardiology

## 2015-06-22 NOTE — Telephone Encounter (Signed)
Rx request sent to pharmacy.  

## 2015-06-26 ENCOUNTER — Encounter: Payer: Self-pay | Admitting: Internal Medicine

## 2015-06-26 ENCOUNTER — Ambulatory Visit (INDEPENDENT_AMBULATORY_CARE_PROVIDER_SITE_OTHER): Payer: Medicare Other | Admitting: Internal Medicine

## 2015-06-26 VITALS — BP 154/74 | HR 67 | Ht 70.0 in | Wt 144.2 lb

## 2015-06-26 DIAGNOSIS — I2510 Atherosclerotic heart disease of native coronary artery without angina pectoris: Secondary | ICD-10-CM

## 2015-06-26 DIAGNOSIS — I480 Paroxysmal atrial fibrillation: Secondary | ICD-10-CM | POA: Insufficient documentation

## 2015-06-26 DIAGNOSIS — I5022 Chronic systolic (congestive) heart failure: Secondary | ICD-10-CM | POA: Diagnosis not present

## 2015-06-26 MED ORDER — ATORVASTATIN CALCIUM 80 MG PO TABS: 80.0000 mg | ORAL_TABLET | Freq: Every day | ORAL | Status: DC

## 2015-06-26 MED ORDER — FUROSEMIDE 40 MG PO TABS: 40.0000 mg | ORAL_TABLET | Freq: Every day | ORAL | Status: DC

## 2015-06-26 MED ORDER — POTASSIUM CHLORIDE CRYS ER 20 MEQ PO TBCR: 20.0000 meq | EXTENDED_RELEASE_TABLET | Freq: Every day | ORAL | Status: DC

## 2015-06-26 MED ORDER — LISINOPRIL 20 MG PO TABS: 20.0000 mg | ORAL_TABLET | Freq: Every day | ORAL | Status: DC

## 2015-06-26 MED ORDER — CARVEDILOL 25 MG PO TABS: 25.0000 mg | ORAL_TABLET | Freq: Two times a day (BID) | ORAL | Status: DC

## 2015-06-26 MED ORDER — RIVAROXABAN 20 MG PO TABS: 20.0000 mg | ORAL_TABLET | Freq: Every day | ORAL | Status: DC

## 2015-06-26 NOTE — Progress Notes (Signed)
Electrophysiology Office Note   Date:  06/26/2015   ID:  Benjamin Cook, DOB 1945/04/10, MRN [id number]  PCP:  Renato Shin, MD  Cardiologist:  Dr [name] Primary Electrophysiologist: Thompson Grayer, MD    Chief Complaint  Patient presents with  . Chronic systolic heart failure     History of Present Illness: Benjamin Cook is a 70 y.o. male who presents today for electrophysiology evaluation.   He remains active.  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  He still smokes cigars and is not ready to quit.  Recently found to have AFib on ICD interrogation though without symptoms.  Denies bleeding. Today, he denies symptoms of palpitations, chest pain, shortness of breath, orthopnea, PND, lower extremity edema, claudication, dizziness, presyncope, syncope, bleeding, or neurologic sequela. The patient is tolerating medications without difficulties and is otherwise without complaint today.    Past Medical History  Diagnosis Date  . CAD (coronary artery disease)     a. s/p PCI to LAD 2001 b. myoview 2014 high risk with scar LAD/RCA territory but no ischemia  . Cardiomyopathy, ischemic   . CHF (congestive heart failure) ([name])     class II/III  . HTN (hypertension)   . Hyperlipidemia   . COPD (chronic obstructive pulmonary disease) ([name])     smokes cigars but has quit cigarettes  . PSA (psoriatic arthritis) ([place])     increased  . Adrenal mass ([place])     per pt this is remote (10 years) and benign by biopsy  . Anxiety   . PVD (peripheral vascular disease) ([place])   . Flu 03/2015   Past Surgical History  Procedure Laterality Date  . Coronary stent placement      a. PCI to LAD 2001  . Cataract surgery    . Implantable cardioverter defibrillator (icd) generator change N/A 03/18/2014    a. SJM Fortify ST DR ICD implanted by [facility] for primary prevention b. gen change 03/2014   . Left heart catheterization with coronary angiogram N/A 05/27/2014    Procedure: LEFT HEART CATHETERIZATION WITH  CORONARY ANGIOGRAM;  Surgeon: Sherren Mocha, MD;  Location: [facility] CATH LAB;  Service: Cardiovascular;  Laterality: N/A;     Current Outpatient Prescriptions  Medication Sig Dispense Refill  . aspirin EC 81 MG EC tablet Take 1 tablet (81 mg total) by mouth daily. 30 tablet   . atorvastatin (LIPITOR) 80 MG tablet Take 1 tablet (80 mg total) by mouth daily at 6 PM. 90 tablet 0  . carvedilol (COREG) 25 MG tablet Take 1 tablet (25 mg total) by mouth 2 (two) times daily with a meal. 160 tablet 0  . Fish Oil OIL Take 360 mg by mouth daily.    . Fluticasone-Salmeterol (ADVAIR) 100-50 MCG/DOSE AEPB Inhale 1 puff into the lungs 2 (two) times daily. 1 each 3  . furosemide (LASIX) 40 MG tablet Take 1 tablet by mouth  daily 90 tablet 0  . lisinopril (PRINIVIL,ZESTRIL) 20 MG tablet Take 20 mg by mouth daily.    . Multiple Vitamin (MULTIVITAMIN) capsule Take 1 capsule by mouth daily.      Marland Kitchen omega-3 acid ethyl esters (LOVAZA) 1 g capsule Take 1 g by mouth daily.    . potassium chloride SA (K-DUR,KLOR-CON) 20 MEQ tablet Take 1 tablet by mouth  daily 90 tablet 0   No current facility-administered medications for this visit.    Allergies:   Review of patient's allergies indicates no known allergies.   Social History:  The patient  reports that he has been smoking Cigars.  He has never used smokeless tobacco. He reports that he does not drink alcohol or use illicit drugs.   Family History:  The patient's family history includes Cirrhosis in his mother. There is no history of Cancer.    ROS:  Please see the history of present illness.   All other systems are reviewed and negative.    PHYSICAL EXAM: VS:  BP 154/74 mmHg  Pulse 67  Ht 5\' 10"  (1.778 m)  Wt 144 lb 3.2 oz (65.409 kg)  BMI 20.69 kg/m2  SpO2 95% , BMI Body mass index is 20.69 kg/(m^2). GEN: Well nourished, well developed, in no acute distress HEENT: normal Neck: no JVD, carotid bruits, or masses Cardiac: RRR; no murmurs, rubs, or gallops,no  edema  Respiratory:  clear to auscultation bilaterally, normal work of breathing GI: soft, nontender, nondistended, + BS MS: no deformity or atrophy Skin: warm and dry, device pocket is well healed Neuro:  Strength and sensation are intact Psych: euthymic mood, full affect  Device interrogation is attempted today and we are still unable to interrogated this device.   Recent Labs: 03/29/2015: Magnesium 1.9 04/10/2015: ALT 17; BUN 9; Creatinine, Ser 0.67; Hemoglobin 14.3; Platelets 331.0; Potassium 3.9; Sodium 138; TSH 0.58    Lipid Panel     Component Value Date/Time   CHOL 108 04/10/2015 1220   TRIG 78.0 04/10/2015 1220   HDL 26.50* 04/10/2015 1220   CHOLHDL 4 04/10/2015 1220   VLDL 15.6 04/10/2015 1220   LDLCALC 66 04/10/2015 1220     Wt Readings from Last 3 Encounters:  06/26/15 144 lb 3.2 oz (65.409 kg)  05/02/15 141 lb (63.957 kg)  04/10/15 138 lb (62.596 kg)     ASSESSMENT AND PLAN:  1.  Ischemic CM/ chronic systolic dysfunction No ischemic symptoms euvolemic today Normal ICD function See [facility] Art report No changes today Will enroll in ICM device clinic with Sharman Cheek  2. HTN Stable No change required today  3. CAD No ischemic symptoms  4. Tobacco Cessation is advised He has quit cigarettes but does not wish to quite cigars  5. Paroxysmal atrial fibrillation Found on ICD interrogation Asymptomatic, though longest episode 18 hours chads2vasc score is at least 4.  Today, I discussed Coumadin as well as novel anticoagulants including Pradaxa, Xarelto, Savaysa, and Eliquis today as indicated for risk reduction in stroke and systemic emboli with nonvalvular atrial fibrillation.  Risks, benefits, and alternatives to each of these drugs were discussed at length today. He would like to start xarelto.  I will therefore stop ASA and start xarelto 20mg  daily.  Labs from 3/17 reviewed.  Will need repeat labs when he sees Dr [name].  Return to see me in 1  year [place] Schedule follow-up at next available with Dr [name]  Current medicines are reviewed at length with the patient today.   The patient does not have concerns regarding his medicines.  The following changes were made today:  none  Signed, Thompson Grayer, MD  06/26/2015 3:14 PM     [address] [place] [address] [phone] (office) [phone] (fax)

## 2015-06-26 NOTE — Progress Notes (Signed)
ICM clinic referral by Dr Rayann Heman.  Met patient in the office and provided ICM introduction.  He agreed to monthly ICM follow up.  Provided my number and advised to call for fluid symptoms.  1st ICM remote transmission schedule for 07/27/2015.

## 2015-06-26 NOTE — Patient Instructions (Signed)
Medication Instructions:  Your physician has recommended you make the following change in your medication:  1) Stop Aspirin 2) Start Xarelto 20 mg daily   Labwork: None ordered   Testing/Procedures: None ordered   Follow-Up: Your physician recommends that you schedule a follow-up appointment in: 2-3 months with Dr [name]  ICM monthly with Sharman Cheek, RN  Your physician wants you to follow-up in: 12 months with Dr Vallery Ridge will receive a reminder letter in the mail two months in advance. If you don't receive a letter, please call our office to schedule the follow-up appointment.  Remote monitoring is used to monitor your  ICD from home. This monitoring reduces the number of office visits required to check your device to one time per year. It allows [place] to keep an eye on the functioning of your device to ensure it is working properly. You are scheduled for a device check from home on 09/25/15. You may send your transmission at any time that day. If you have a wireless device, the transmission will be sent automatically. After your physician reviews your transmission, you will receive a postcard with your next transmission date.      Any Other Special Instructions Will Be Listed Below (If Applicable).     If you need a refill on your cardiac medications before your next appointment, please call your pharmacy.

## 2015-06-27 ENCOUNTER — Telehealth: Payer: Self-pay

## 2015-06-27 NOTE — Telephone Encounter (Signed)
Prior auth obtained from [facility] for Xarelto 20mg . [id number]. Good through 02/04/2016.

## 2015-07-20 ENCOUNTER — Other Ambulatory Visit: Payer: Self-pay | Admitting: *Deleted

## 2015-07-20 MED ORDER — RIVAROXABAN 20 MG PO TABS: 20.0000 mg | ORAL_TABLET | Freq: Every day | ORAL | Status: DC

## 2015-07-24 ENCOUNTER — Telehealth: Payer: Self-pay | Admitting: *Deleted

## 2015-07-24 ENCOUNTER — Encounter: Payer: Self-pay | Admitting: Internal Medicine

## 2015-07-24 NOTE — Telephone Encounter (Signed)
No VM @ home, VM full on mobile #

## 2015-07-24 NOTE — Telephone Encounter (Signed)
Benjamin Cook called back. He says that yesterday morning he was cleaning out a trashcan with some RAID spray and he started feeling unwell, sat on the porch and he says that he was "out" for a few seconds. I made him aware that his heart did go into a fatal arrhythmia and he received a shock that converted the rhythm. He says that he does not recall the shock but he has been working very hard this weekend as his wife is out of town and he is having to take care of everything around the house. He says he feels much better today. I advised him of [place] driving restrictions x 6 months for appropriate therapy, he verbalizes understanding. He reports that he is taking his medications as prescribed and will keep his follow up with Dr. [name] 09/15/15.

## 2015-07-27 ENCOUNTER — Ambulatory Visit (INDEPENDENT_AMBULATORY_CARE_PROVIDER_SITE_OTHER): Payer: Medicare Other

## 2015-07-27 ENCOUNTER — Telehealth: Payer: Self-pay

## 2015-07-27 DIAGNOSIS — I5022 Chronic systolic (congestive) heart failure: Secondary | ICD-10-CM | POA: Diagnosis not present

## 2015-07-27 DIAGNOSIS — Z95810 Presence of automatic (implantable) cardiac defibrillator: Secondary | ICD-10-CM | POA: Diagnosis not present

## 2015-07-27 NOTE — Progress Notes (Signed)
EPIC Encounter for ICM Monitoring  Patient Name: Benjamin Cook is a 70 y.o. male Date: 07/27/2015 Primary Care Physican: Renato Shin, MD Primary Cardiologist: [name] Electrophysiologist: Allred Dry Weight: 140 lbs      In the past month, have you:  1. Gained more than 2 pounds in a day or more than 5 pounds in a week? No, does not routinely weigh at home  2. Had changes in your medications (with verification of current medications)? No  3. Had more shortness of breath than is usual for you? No   4. Limited your activity because of shortness of breath? No  5. Not been able to sleep because of shortness of breath? No  6. Had increased swelling in your feet, ankles, legs or stomach area? No  7. Had symptoms of dehydration (dizziness, dry mouth, increased thirst, decreased urine output) No  8. Had changes in sodium restriction? No  9. Been compliant with medication? No  ICM trend: 3 month view for 07/27/2015   ICM trend: 1 year view for 07/27/2015   Follow-up plan: ICM clinic phone appointment 08/30/2015.  1st ICM encounter.    FLUID LEVELS: Corvue thoracic impedance decreased 07/12/2015 to 07/18/2015 suggesting fluid accumulation and returned to baseline 07/18/2015 suggesting fluid levels are stablizing.          SYMPTOMS:  Patient denied any fluid symptoms and reviewed symptoms to report.  He stated he has been feeling fine.  Patient received device shock on 07/23/2015.  He denied any problems at this time.           EDUCATION:  Discussed low salt diet and admitted he does not follow a low salt as recommended.  Encouraged to check food labels for salt amount.          RECOMMENDATIONS:  No changes today.    [name], RN, CCM 07/27/2015 11:58 AM

## 2015-07-27 NOTE — Telephone Encounter (Signed)
Remote ICM transmission received.  Attempted patient call to cell and home phone number.  Neither one of the numbers has option to leave voice mail message.   Attempted 2nd call and spoke with patient

## 2015-08-28 ENCOUNTER — Encounter: Payer: Self-pay | Admitting: Cardiology

## 2015-08-30 ENCOUNTER — Ambulatory Visit (INDEPENDENT_AMBULATORY_CARE_PROVIDER_SITE_OTHER): Payer: Medicare Other

## 2015-08-30 DIAGNOSIS — I5022 Chronic systolic (congestive) heart failure: Secondary | ICD-10-CM

## 2015-08-30 DIAGNOSIS — Z95810 Presence of automatic (implantable) cardiac defibrillator: Secondary | ICD-10-CM

## 2015-08-30 NOTE — Progress Notes (Signed)
EPIC Encounter for ICM Monitoring  Patient Name: Benjamin Cook is a 70 y.o. male Date: 08/30/2015 Primary Care Physican: Renato Shin, MD Primary Cardiologist: [name] Electrophysiologist: Allred Dry Weight: 140 lb   Heart Failure questions reviewed, pt asymptomatic   Thoracic impedance decreased suggesting fluid accumulation 08/28/2015 to 08/29/2015 and almost back at baseline 08/30/2015.  Recommendations:  Appointment with Dr [name] on 09/15/2015.  No changes.  Patient reported not following low salt diet. He reported eating fresh tomatoes and corn with salt.  Advised to limit salt intake to 2000 mg.  He verbalized understanding.    ICM trend: 08/30/2015    Follow-up plan: ICM clinic phone appointment on 10/03/2015.  Copy of ICM check sent to primary cardiologist and device physician.   [name], RN 08/30/2015 3:19 PM

## 2015-09-06 NOTE — Progress Notes (Signed)
HPI: FU coronary disease, ischemic cardiomyopathy, PAF, prior ICD, hypertension and hyperlipidemia. Cardiac history dates back to June of 2001. At that time the patient was found to have an abnormal electrocardiogram. Catheterization revealed a 95% mid LAD and a 70% diffuse right coronary artery. His ejection fraction was 25-30%. He had PCI of his LAD at that time. Abdominal ultrasound in Sept 2014 showed a 4.1x4.1 cm aneurysm and dilated right iliac. Followup recommended in six months. Last cardiac catheterization April 2016 showed diffuse nonobstructive coronary disease and ejection fraction 35%. Last echocardiogram February 2017 showed ejection fraction 30-35%, mild left atrial enlargement and grade 1 diastolic dysfunction. Patient also with paroxysmal atrial fibrillation noted on previous ICD interrogation. I last saw the patient in October 2014. Since last seen, Patient denies dyspnea, chest pain, palpitations. Approximately one month ago his device shocked him. He's had no problems since then. He had syncope prior to the device discharge.  Current Outpatient Prescriptions  Medication Sig Dispense Refill  . atorvastatin (LIPITOR) 80 MG tablet Take 1 tablet (80 mg total) by mouth daily at 6 PM. 90 tablet 3  . carvedilol (COREG) 25 MG tablet Take 1 tablet (25 mg total) by mouth 2 (two) times daily with a meal. 180 tablet 3  . Fish Oil OIL Take 360 mg by mouth daily.    . Fluticasone-Salmeterol (ADVAIR) 100-50 MCG/DOSE AEPB Inhale 1 puff into the lungs 2 (two) times daily. 1 each 3  . furosemide (LASIX) 40 MG tablet Take 1 tablet (40 mg total) by mouth daily. 90 tablet 3  . lisinopril (PRINIVIL,ZESTRIL) 20 MG tablet Take 1 tablet (20 mg total) by mouth daily. 90 tablet 3  . Multiple Vitamin (MULTIVITAMIN) capsule Take 1 capsule by mouth daily.      Marland Kitchen omega-3 acid ethyl esters (LOVAZA) 1 g capsule Take 1 g by mouth daily.    . potassium chloride SA (K-DUR,KLOR-CON) 20 MEQ tablet Take 1 tablet  (20 mEq total) by mouth daily. 90 tablet 3  . rivaroxaban (XARELTO) 20 MG TABS tablet Take 1 tablet (20 mg total) by mouth daily with supper. 90 tablet 3   No current facility-administered medications for this visit.      Past Medical History:  Diagnosis Date  . Adrenal mass ([place])    per pt this is remote (10 years) and benign by biopsy  . Anxiety   . CAD (coronary artery disease)    a. s/p PCI to LAD 2001 b. myoview 2014 high risk with scar LAD/RCA territory but no ischemia  . Cardiomyopathy, ischemic   . CHF (congestive heart failure) ([name])    class II/III  . COPD (chronic obstructive pulmonary disease) ([name])    smokes cigars but has quit cigarettes  . Flu 03/2015  . HTN (hypertension)   . Hyperlipidemia   . Paroxysmal atrial fibrillation ([name]) 03/2015   chads2vasc score is 4  . PSA (psoriatic arthritis) ([place])    increased  . PVD (peripheral vascular disease) ([place])     Past Surgical History:  Procedure Laterality Date  . cataract surgery    . CORONARY STENT PLACEMENT     a. PCI to LAD 2001  . IMPLANTABLE CARDIOVERTER DEFIBRILLATOR (ICD) GENERATOR CHANGE N/A 03/18/2014   a. SJM Fortify ST DR ICD implanted by [facility] for primary prevention b. gen change 03/2014   . LEFT HEART CATHETERIZATION WITH CORONARY ANGIOGRAM N/A 05/27/2014   Procedure: LEFT HEART CATHETERIZATION WITH CORONARY ANGIOGRAM;  Surgeon: Sherren Mocha, MD;  Location: [place] CATH LAB;  Service: Cardiovascular;  Laterality: N/A;    Social History   Social History  . Marital status: Married    Spouse name: N/A  . Number of children: N/A  . Years of education: N/A   Occupational History  . Lawn care    Social History Main Topics  . Smoking status: Current Every Day Smoker    Types: Cigars  . Smokeless tobacco: Never Used     Comment: smokes cigars now, no longer smokes cigarettes but previously smoked 2ppd  . Alcohol use No  . Drug use: No  . Sexual activity: Not on file   Other Topics Concern  . Not on  file   Social History Narrative  . No narrative on file    Family History  Problem Relation Age of Onset  . Cirrhosis Mother     due to ETOH  . Cancer Neg Hx     ROS: no fevers or chills, productive cough, hemoptysis, dysphasia, odynophagia, melena, hematochezia, dysuria, hematuria, rash, seizure activity, orthopnea, PND, pedal edema, claudication. Remaining systems are negative.  Physical Exam: Well-developed well-nourished in no acute distress.  Skin is warm and dry.  HEENT is normal.  Neck is supple.  Chest is clear to auscultation with normal expansion.  Cardiovascular exam is regular rate and rhythm.  Abdominal exam nontender or distended. No masses palpated. Extremities show no edema. neuro grossly intact  ECG Sinus bradycardia at a rate of 52. Prior septal infarct. Inferior lateral T-wave inversion.  A/P  1 Abdominal aortic aneurysm/iliac aneurysms-patient needs follow-up ultrasound. Will arrange.  2 Ischemic cardiomyopathy-continue beta blocker and ACE inhibitor.  3 paroxysmal atrial fibrillation-continue beta blocker and xarelto. Check hemoglobin and renal function.  4 hyperlipidemia-continue statin. Check lipids and liver.  5 coronary artery disease-continue statin. No aspirin given need for anticoagulation.  6 tobacco abuse-patient counseled on discontinuing.  7 ICD-patient had a discharge approximately one month ago. Apparently it was appropriate based on his report. Patient instructed not to drive for 6 months.  8 hypertension-blood pressure controlled. Continue present medications.  Kirk Ruths, MD

## 2015-09-15 ENCOUNTER — Encounter: Payer: Self-pay | Admitting: Cardiology

## 2015-09-15 ENCOUNTER — Ambulatory Visit (INDEPENDENT_AMBULATORY_CARE_PROVIDER_SITE_OTHER): Payer: Medicare Other | Admitting: Cardiology

## 2015-09-15 VITALS — BP 140/76 | HR 52 | Ht 71.0 in | Wt 145.0 lb

## 2015-09-15 DIAGNOSIS — I714 Abdominal aortic aneurysm, without rupture, unspecified: Secondary | ICD-10-CM

## 2015-09-15 DIAGNOSIS — E785 Hyperlipidemia, unspecified: Secondary | ICD-10-CM | POA: Diagnosis not present

## 2015-09-15 DIAGNOSIS — I480 Paroxysmal atrial fibrillation: Secondary | ICD-10-CM | POA: Diagnosis not present

## 2015-09-15 DIAGNOSIS — I5022 Chronic systolic (congestive) heart failure: Secondary | ICD-10-CM | POA: Diagnosis not present

## 2015-09-15 DIAGNOSIS — I255 Ischemic cardiomyopathy: Secondary | ICD-10-CM

## 2015-09-15 DIAGNOSIS — I723 Aneurysm of iliac artery: Secondary | ICD-10-CM | POA: Diagnosis not present

## 2015-09-15 DIAGNOSIS — Z95810 Presence of automatic (implantable) cardiac defibrillator: Secondary | ICD-10-CM

## 2015-09-15 LAB — CBC WITH DIFFERENTIAL/PLATELET
BASOS ABS: 0 {cells}/uL (ref 0–200)
Basophils Relative: 0 %
EOS ABS: 288 {cells}/uL (ref 15–500)
EOS PCT: 4 %
HCT: 45.9 % (ref 38.5–50.0)
Hemoglobin: 15.2 g/dL (ref 13.2–17.1)
LYMPHS PCT: 27 %
Lymphs Abs: 1944 cells/uL (ref 850–3900)
MCH: 30.4 pg (ref 27.0–33.0)
MCHC: 33.1 g/dL (ref 32.0–36.0)
MCV: 91.8 fL (ref 80.0–100.0)
MONOS PCT: 10 %
MPV: 10.6 fL (ref 7.5–12.5)
Monocytes Absolute: 720 cells/uL (ref 200–950)
NEUTROS ABS: 4248 {cells}/uL (ref 1500–7800)
Neutrophils Relative %: 59 %
PLATELETS: 188 10*3/uL (ref 140–400)
RBC: 5 MIL/uL (ref 4.20–5.80)
RDW: 14.9 % (ref 11.0–15.0)
WBC: 7.2 10*3/uL (ref 3.8–10.8)

## 2015-09-15 LAB — BASIC METABOLIC PANEL
BUN: 10 mg/dL (ref 7–25)
CALCIUM: 9.4 mg/dL (ref 8.6–10.3)
CO2: 31 mmol/L (ref 20–31)
CREATININE: 0.88 mg/dL (ref 0.70–1.18)
Chloride: 106 mmol/L (ref 98–110)
Glucose, Bld: 97 mg/dL (ref 65–99)
Potassium: 4.6 mmol/L (ref 3.5–5.3)
Sodium: 142 mmol/L (ref 135–146)

## 2015-09-15 LAB — HEPATIC FUNCTION PANEL
ALK PHOS: 76 U/L (ref 40–115)
ALT: 16 U/L (ref 9–46)
AST: 21 U/L (ref 10–35)
Albumin: 3.9 g/dL (ref 3.6–5.1)
BILIRUBIN DIRECT: 0.1 mg/dL (ref <=0.2)
BILIRUBIN INDIRECT: 0.4 mg/dL (ref 0.2–1.2)
BILIRUBIN TOTAL: 0.5 mg/dL (ref 0.2–1.2)
Total Protein: 6.8 g/dL (ref 6.1–8.1)

## 2015-09-15 LAB — LIPID PANEL
CHOL/HDL RATIO: 2.5 ratio (ref <=5.0)
CHOLESTEROL: 108 mg/dL — AB (ref 125–200)
HDL: 44 mg/dL (ref >=40)
LDL Cholesterol: 54 mg/dL (ref <130)
Triglycerides: 51 mg/dL (ref <150)
VLDL: 10 mg/dL (ref <30)

## 2015-09-15 NOTE — Patient Instructions (Signed)
Medication Instructions:  Your physician recommends that you continue on your current medications as directed. Please refer to the Current Medication list given to you today.   Labwork: Your physician recommends that you have lab work in today: lipid/lft/bmet/cbc   Testing/Procedures: Abdominal Aorta with Iliacs [place] : Dx: [email or web address]  Follow-Up: Your physician wants you to follow-up in: 6 months with Dr. [name].  You will receive a reminder letter in the mail two months in advance. If you don't receive a letter, please call our office to schedule the follow-up appointment.   Any Other Special Instructions Will Be Listed Below (If Applicable).     If you need a refill on your cardiac medications before your next appointment, please call your pharmacy.

## 2015-10-03 ENCOUNTER — Ambulatory Visit (INDEPENDENT_AMBULATORY_CARE_PROVIDER_SITE_OTHER): Payer: Medicare Other | Admitting: *Deleted

## 2015-10-03 DIAGNOSIS — Z95810 Presence of automatic (implantable) cardiac defibrillator: Secondary | ICD-10-CM | POA: Diagnosis not present

## 2015-10-03 DIAGNOSIS — I5022 Chronic systolic (congestive) heart failure: Secondary | ICD-10-CM | POA: Diagnosis not present

## 2015-10-03 DIAGNOSIS — I255 Ischemic cardiomyopathy: Secondary | ICD-10-CM

## 2015-10-03 NOTE — Progress Notes (Signed)
EPIC Encounter for ICM Monitoring  Patient Name: Benjamin Cook is a 70 y.o. male Date: 10/03/2015 Primary Care Physican: Renato Shin, MD Primary Cardiologist: [name] Electrophysiologist: Allred Dry Weight: 140 lb       Heart Failure questions reviewed, pt asymptomatic.  Thoracic impedance abnormal suggesting fluid accumulation 09/30/2015 to 10/03/2015 and returned to baseline 10/03/2015.  Patient reported eating [employer] Hamburgers and french fries a few times in last few days.  He also forgot to take Furosemide 09/30/2015.    Recommendations: No changes.  Advised restaurant foods are high in sodium and may have caused the fluid retention in addition to missing Furosemide dosage.  Recommended compliance with diet and meds.      Follow-up plan: ICM clinic phone appointment on 11/08/2015.  Copy of ICM check sent to device physician.   ICM trend: 10/03/2015       [name], RN 10/03/2015 2:29 PM

## 2015-10-04 NOTE — Progress Notes (Signed)
Remote ICD transmission.   

## 2015-10-05 ENCOUNTER — Encounter: Payer: Self-pay | Admitting: Cardiology

## 2015-10-13 ENCOUNTER — Inpatient Hospital Stay (HOSPITAL_COMMUNITY): Admission: RE | Admit: 2015-10-13 | Payer: Medicare Other | Source: Ambulatory Visit

## 2015-10-20 ENCOUNTER — Encounter: Payer: Self-pay | Admitting: Cardiology

## 2015-10-26 LAB — CUP PACEART REMOTE DEVICE CHECK
Battery Remaining Percentage: 82 %
Brady Statistic RA Percent Paced: 1 % — CL
HighPow Impedance: 78 Ohm
Implantable Lead Implant Date: 20120420
Implantable Lead Implant Date: 20120420
Implantable Lead Location: 753860
Lead Channel Impedance Value: 390 Ohm
Lead Channel Sensing Intrinsic Amplitude: 11.6 mV
Lead Channel Setting Pacing Amplitude: 2 V
Lead Channel Setting Pacing Pulse Width: 0.5 ms
Lead Channel Setting Sensing Sensitivity: 0.5 mV
MDC IDC LEAD LOCATION: 753859
MDC IDC MSMT BATTERY REMAINING LONGEVITY: 80 mo
MDC IDC MSMT LEADCHNL RA IMPEDANCE VALUE: 440 Ohm
MDC IDC MSMT LEADCHNL RA SENSING INTR AMPL: 2.9 mV
MDC IDC PG SERIAL: 7225079
MDC IDC SESS DTM: 20170921113108
MDC IDC SET LEADCHNL RV PACING AMPLITUDE: 2.5 V
MDC IDC STAT BRADY RV PERCENT PACED: 1 % — AB

## 2015-11-02 ENCOUNTER — Ambulatory Visit: Payer: Medicare Other | Admitting: Endocrinology

## 2015-11-08 ENCOUNTER — Ambulatory Visit (INDEPENDENT_AMBULATORY_CARE_PROVIDER_SITE_OTHER): Payer: Medicare Other

## 2015-11-08 DIAGNOSIS — Z95810 Presence of automatic (implantable) cardiac defibrillator: Secondary | ICD-10-CM | POA: Diagnosis not present

## 2015-11-08 DIAGNOSIS — I5022 Chronic systolic (congestive) heart failure: Secondary | ICD-10-CM

## 2015-11-09 ENCOUNTER — Telehealth: Payer: Self-pay

## 2015-11-09 NOTE — Telephone Encounter (Signed)
Remote ICM transmission received.  Attempted patient call on home and cell number.  Unable to leave message on either one due to full mail box or mail box not set up.

## 2015-11-09 NOTE — Progress Notes (Signed)
EPIC Encounter for ICM Monitoring  Patient Name: Benjamin Cook is a 70 y.o. male Date: 11/09/2015 Primary Care Physican: Renato Shin, MD Primary Cardiologist: [name] Electrophysiologist: Allred Dry Weight: unknown        Attempted ICM call and unable to reach.  Transmission reviewed.   Thoracic impedance normal.    Follow-up plan: ICM clinic phone appointment on 12/14/2015.  Copy of ICM check sent to device physician.   ICM trend: 11/08/2015             Benjamin Billings, RN 11/09/2015 10:30 AM

## 2015-11-24 ENCOUNTER — Ambulatory Visit (HOSPITAL_COMMUNITY)
Admission: RE | Admit: 2015-11-24 | Discharge: 2015-11-24 | Disposition: A | Discharge status: Home / Self Care | Payer: Medicare Other | Source: Ambulatory Visit | Attending: Cardiology | Admitting: Cardiology

## 2015-11-24 DIAGNOSIS — I7409 Other arterial embolism and thrombosis of abdominal aorta: Secondary | ICD-10-CM | POA: Diagnosis not present

## 2015-11-24 DIAGNOSIS — J449 Chronic obstructive pulmonary disease, unspecified: Secondary | ICD-10-CM | POA: Insufficient documentation

## 2015-11-24 DIAGNOSIS — I2510 Atherosclerotic heart disease of native coronary artery without angina pectoris: Secondary | ICD-10-CM | POA: Diagnosis not present

## 2015-11-24 DIAGNOSIS — E785 Hyperlipidemia, unspecified: Secondary | ICD-10-CM | POA: Diagnosis not present

## 2015-11-24 DIAGNOSIS — I10 Essential (primary) hypertension: Secondary | ICD-10-CM | POA: Insufficient documentation

## 2015-11-24 DIAGNOSIS — I714 Abdominal aortic aneurysm, without rupture: Secondary | ICD-10-CM | POA: Diagnosis not present

## 2015-11-24 DIAGNOSIS — Z720 Tobacco use: Secondary | ICD-10-CM | POA: Diagnosis not present

## 2015-11-24 DIAGNOSIS — I723 Aneurysm of iliac artery: Secondary | ICD-10-CM | POA: Insufficient documentation

## 2015-11-24 DIAGNOSIS — I77819 Aortic ectasia, unspecified site: Secondary | ICD-10-CM | POA: Insufficient documentation

## 2015-12-14 ENCOUNTER — Ambulatory Visit (INDEPENDENT_AMBULATORY_CARE_PROVIDER_SITE_OTHER): Payer: Medicare Other

## 2015-12-14 DIAGNOSIS — Z95810 Presence of automatic (implantable) cardiac defibrillator: Secondary | ICD-10-CM | POA: Diagnosis not present

## 2015-12-14 DIAGNOSIS — I5022 Chronic systolic (congestive) heart failure: Secondary | ICD-10-CM | POA: Diagnosis not present

## 2015-12-15 NOTE — Progress Notes (Signed)
EPIC Encounter for ICM Monitoring  Patient Name: Benjamin Cook is a 70 y.o. male Date: 12/15/2015 Primary Care Physican: Renato Shin, MD Primary Cardiologist:Crenshaw Electrophysiologist: Allred Dry Weight:    140 lbs       Heart Failure questions reviewed, pt asymptomatic   Thoracic impedance normal   Recommendations: No changes.  Advised to limit salt intake to 2000 mg daily.  Encouraged to call for fluid symptoms.    Follow-up plan: ICM clinic phone appointment on 01/15/2016.  Copy of ICM check sent to device physician.   ICM trend: 12/14/2015       [name], RN 12/15/2015 10:34 AM

## 2016-01-02 ENCOUNTER — Ambulatory Visit (INDEPENDENT_AMBULATORY_CARE_PROVIDER_SITE_OTHER): Payer: Medicare Other | Admitting: *Deleted

## 2016-01-02 DIAGNOSIS — I5022 Chronic systolic (congestive) heart failure: Secondary | ICD-10-CM

## 2016-01-02 DIAGNOSIS — Z95810 Presence of automatic (implantable) cardiac defibrillator: Secondary | ICD-10-CM

## 2016-01-02 DIAGNOSIS — I255 Ischemic cardiomyopathy: Secondary | ICD-10-CM | POA: Diagnosis not present

## 2016-01-03 NOTE — Progress Notes (Signed)
Remote ICD transmission.   

## 2016-01-04 ENCOUNTER — Encounter: Payer: Self-pay | Admitting: Cardiology

## 2016-01-04 NOTE — Progress Notes (Signed)
EPIC Encounter for ICM Monitoring  Patient Name: Benjamin Cook is a 70 y.o. male Date: 01/04/2016 Primary Care Physican: Renato Shin, MD Primary Cardiologist:Crenshaw Electrophysiologist: Allred Dry Weight:    unknown        Spoke with wife. Heart Failure questions reviewed, pt asymptomatic   Thoracic impedance normal   Recommendations: No changes.  Reinforced low salt food choices and limiting fluid intake to < 2 liters per day. Encouraged to call for fluid symptoms.    Follow-up plan: ICM clinic phone appointment on 02/06/2016.  Copy of ICM check sent to device physician.   ICM trend: 01/03/2016       [name], RN 01/04/2016 2:38 PM

## 2016-01-11 ENCOUNTER — Telehealth: Payer: Self-pay | Admitting: Internal Medicine

## 2016-01-11 NOTE — Telephone Encounter (Signed)
New message      Calling to get most recent ejection fraction.  Please fax it to [phone]

## 2016-01-11 NOTE — Telephone Encounter (Signed)
03/2015---LV EF: 30% -   35%.  I have left a message for nurse in regards to requested information

## 2016-01-19 ENCOUNTER — Encounter: Payer: Self-pay | Admitting: Cardiology

## 2016-02-06 ENCOUNTER — Telehealth: Payer: Self-pay

## 2016-02-06 ENCOUNTER — Ambulatory Visit (INDEPENDENT_AMBULATORY_CARE_PROVIDER_SITE_OTHER): Payer: Medicare Other

## 2016-02-06 ENCOUNTER — Telehealth: Payer: Self-pay | Admitting: Cardiology

## 2016-02-06 DIAGNOSIS — Z95810 Presence of automatic (implantable) cardiac defibrillator: Secondary | ICD-10-CM | POA: Diagnosis not present

## 2016-02-06 DIAGNOSIS — I5022 Chronic systolic (congestive) heart failure: Secondary | ICD-10-CM | POA: Diagnosis not present

## 2016-02-06 NOTE — Telephone Encounter (Signed)
Remote ICM transmission received.  Attempted patient call and no answer or answering machine.  

## 2016-02-06 NOTE — Telephone Encounter (Signed)
Spoke with pt and reminded pt of remote transmission that is due today. Pt verbalized understanding.   

## 2016-02-06 NOTE — Progress Notes (Signed)
EPIC Encounter for ICM Monitoring  Patient Name: Benjamin Cook is a 71 y.o. male Date: 02/06/2016 Primary Care Physican: Renato Shin, MD Primary Cardiologist:Crenshaw Electrophysiologist: Allred Dry Weight:unknown       Attempted ICM call and unable to reach and unable to leave a message.  Transmission reviewed.   Thoracic impedance normal   Recommendations: No changes.  Reinforced to limit low salt food choices to 2000 mg day and limiting fluid intake to < 2 liters per day. Encouraged to call for fluid symptoms.    Follow-up plan: ICM clinic phone appointment on 03/08/2016.  Copy of ICM check sent to device physician.   3 month ICM trend : 02/06/2016   1 Year ICM trend:      [name], RN 02/06/2016 4:14 PM

## 2016-02-15 LAB — CUP PACEART REMOTE DEVICE CHECK
Battery Remaining Longevity: 85 mo
Battery Remaining Percentage: 79 %
Brady Statistic AP VS Percent: 1 %
Brady Statistic RA Percent Paced: 1 %
Brady Statistic RV Percent Paced: 1 %
HIGH POWER IMPEDANCE MEASURED VALUE: 82 Ohm
HighPow Impedance: 82 Ohm
Implantable Lead Implant Date: 20120420
Implantable Lead Location: 753860
Implantable Pulse Generator Implant Date: 20160212
Lead Channel Impedance Value: 450 Ohm
Lead Channel Impedance Value: 600 Ohm
Lead Channel Pacing Threshold Amplitude: 0.5 V
Lead Channel Pacing Threshold Amplitude: 1 V
Lead Channel Pacing Threshold Pulse Width: 0.5 ms
Lead Channel Sensing Intrinsic Amplitude: 1.8 mV
Lead Channel Setting Pacing Amplitude: 2.5 V
Lead Channel Setting Pacing Pulse Width: 0.5 ms
MDC IDC LEAD IMPLANT DT: 20120420
MDC IDC LEAD LOCATION: 753859
MDC IDC MSMT BATTERY VOLTAGE: 3.01 V
MDC IDC MSMT LEADCHNL RA PACING THRESHOLD PULSEWIDTH: 0.5 ms
MDC IDC MSMT LEADCHNL RV SENSING INTR AMPL: 11.6 mV
MDC IDC PG SERIAL: 7225079
MDC IDC SESS DTM: 20171128132819
MDC IDC SET LEADCHNL RA PACING AMPLITUDE: 2 V
MDC IDC SET LEADCHNL RV SENSING SENSITIVITY: 0.5 mV
MDC IDC STAT BRADY AP VP PERCENT: 1 %
MDC IDC STAT BRADY AS VP PERCENT: 1 %
MDC IDC STAT BRADY AS VS PERCENT: 97 %

## 2016-03-08 ENCOUNTER — Telehealth: Payer: Self-pay

## 2016-03-08 ENCOUNTER — Ambulatory Visit (INDEPENDENT_AMBULATORY_CARE_PROVIDER_SITE_OTHER): Payer: Medicare Other

## 2016-03-08 DIAGNOSIS — I5022 Chronic systolic (congestive) heart failure: Secondary | ICD-10-CM

## 2016-03-08 DIAGNOSIS — Z95810 Presence of automatic (implantable) cardiac defibrillator: Secondary | ICD-10-CM | POA: Diagnosis not present

## 2016-03-08 NOTE — Telephone Encounter (Signed)
Remote ICM transmission received.  Attempted patient call and no answer or answering machine.  

## 2016-03-08 NOTE — Progress Notes (Signed)
EPIC Encounter for ICM Monitoring  Patient Name: Benjamin Cook is a 71 y.o. male Date: 03/08/2016 Primary Care Physican: Renato Shin, MD Primary Cardiologist:Crenshaw Electrophysiologist: Allred Dry Weight: unknown       Attempted call to patient and unable to reach.  Transmission reviewed.   Thoracic impedance normal   Recommendations: NONE - Unable to reach patient   Follow-up plan: ICM clinic phone appointment on 04/08/2016.  Copy of ICM check sent to device physician.   3 month ICM trend: 03/08/2016   1 Year ICM trend:      [name], RN 03/08/2016 9:13 AM

## 2016-04-08 ENCOUNTER — Telehealth: Payer: Self-pay | Admitting: Cardiology

## 2016-04-08 ENCOUNTER — Ambulatory Visit (INDEPENDENT_AMBULATORY_CARE_PROVIDER_SITE_OTHER): Payer: Medicare Other | Admitting: *Deleted

## 2016-04-08 DIAGNOSIS — I255 Ischemic cardiomyopathy: Secondary | ICD-10-CM | POA: Diagnosis not present

## 2016-04-08 DIAGNOSIS — I5022 Chronic systolic (congestive) heart failure: Secondary | ICD-10-CM

## 2016-04-08 DIAGNOSIS — Z95810 Presence of automatic (implantable) cardiac defibrillator: Secondary | ICD-10-CM

## 2016-04-08 NOTE — Telephone Encounter (Signed)
Spoke with pt and reminded pt of remote transmission that is due today. Pt verbalized understanding.   

## 2016-04-09 ENCOUNTER — Encounter: Payer: Self-pay | Admitting: Cardiology

## 2016-04-09 LAB — CUP PACEART REMOTE DEVICE CHECK
Battery Voltage: 2.99 V
Brady Statistic AP VP Percent: 1 %
Brady Statistic AS VP Percent: 1 %
Brady Statistic RA Percent Paced: 1 %
Brady Statistic RV Percent Paced: 1 %
HighPow Impedance: 72 Ohm
HighPow Impedance: 72 Ohm
Implantable Lead Implant Date: 20120420
Implantable Lead Location: 753860
Implantable Pulse Generator Implant Date: 20160212
Lead Channel Impedance Value: 410 Ohm
Lead Channel Pacing Threshold Amplitude: 0.5 V
Lead Channel Pacing Threshold Pulse Width: 0.5 ms
Lead Channel Pacing Threshold Pulse Width: 0.5 ms
Lead Channel Sensing Intrinsic Amplitude: 11.6 mV
Lead Channel Setting Pacing Amplitude: 2 V
MDC IDC LEAD IMPLANT DT: 20120420
MDC IDC LEAD LOCATION: 753859
MDC IDC MSMT BATTERY REMAINING LONGEVITY: 79 mo
MDC IDC MSMT BATTERY REMAINING PERCENTAGE: 77 %
MDC IDC MSMT LEADCHNL RA SENSING INTR AMPL: 2 mV
MDC IDC MSMT LEADCHNL RV IMPEDANCE VALUE: 340 Ohm
MDC IDC MSMT LEADCHNL RV PACING THRESHOLD AMPLITUDE: 1 V
MDC IDC PG SERIAL: 7225079
MDC IDC SESS DTM: 20180306065016
MDC IDC SET LEADCHNL RV PACING AMPLITUDE: 2.5 V
MDC IDC SET LEADCHNL RV PACING PULSEWIDTH: 0.5 ms
MDC IDC SET LEADCHNL RV SENSING SENSITIVITY: 0.5 mV
MDC IDC STAT BRADY AP VS PERCENT: 1 %
MDC IDC STAT BRADY AS VS PERCENT: 97 %

## 2016-04-09 NOTE — Progress Notes (Signed)
Remote ICD transmission.   

## 2016-04-16 NOTE — Progress Notes (Signed)
EPIC Encounter for ICM Monitoring  Patient Name: Benjamin Cook is a 71 y.o. male Date: 04/16/2016 Primary Care Physican: Renato Shin, MD Primary Cardiologist:Crenshaw Electrophysiologist: Allred Dry Weight: unknown                Transmission reviewed.    Thoracic impedance normal   Recommendations: None  Follow-up plan: ICM clinic phone appointment on 05/20/2016.  Copy of ICM check sent to device physician.   3 month ICM trend: 04/09/2016   1 Year ICM trend:      [name], RN 04/16/2016 6:56 PM

## 2016-04-24 ENCOUNTER — Encounter: Payer: Self-pay | Admitting: Cardiology

## 2016-05-20 ENCOUNTER — Ambulatory Visit (INDEPENDENT_AMBULATORY_CARE_PROVIDER_SITE_OTHER): Payer: Medicare Other

## 2016-05-20 ENCOUNTER — Telehealth: Payer: Self-pay | Admitting: Cardiology

## 2016-05-20 DIAGNOSIS — I5022 Chronic systolic (congestive) heart failure: Secondary | ICD-10-CM | POA: Diagnosis not present

## 2016-05-20 DIAGNOSIS — Z95810 Presence of automatic (implantable) cardiac defibrillator: Secondary | ICD-10-CM | POA: Diagnosis not present

## 2016-05-20 NOTE — Telephone Encounter (Signed)
Attempted to confirm remote transmission with pt. No answer and was unable to leave a message.   

## 2016-05-24 ENCOUNTER — Telehealth: Payer: Self-pay

## 2016-05-24 NOTE — Telephone Encounter (Signed)
Remote ICM transmission received.  Attempted patient call and no answer or answering machine.  

## 2016-05-24 NOTE — Progress Notes (Signed)
EPIC Encounter for ICM Monitoring  Patient Name: DAYVON DAX is a 71 y.o. male Date: 05/24/2016 Primary Care Physican: Renato Shin, MD Primary Cardiologist:Crenshaw Electrophysiologist: Allred Dry Weight: unknown       Attempted call to patient and unable to reach.   Transmission reviewed.    Thoracic impedance is normal but was abnormal suggesting fluid accumulation from 05/12/2016 to 05/17/2016 and 04/27/2016 to 05/02/2016.  Prescribed dosage: Furosemide 40 mg 1 tablet daily.  Potassium 20 mEq 1 tablet daily.  Recommendations: NONE - Unable to reach patient   Follow-up plan: ICM clinic phone appointment on 06/24/2016.   Overdue to make an appointment with Dr [name] (last visit Feb 2017) and due to make 1 year appointment with Dr Rayann Heman for May.    Copy of ICM check sent to device physician.   3 month ICM trend: 05/21/2016   1 Year ICM trend:      [name], RN 05/24/2016 7:55 AM

## 2016-06-24 ENCOUNTER — Ambulatory Visit (INDEPENDENT_AMBULATORY_CARE_PROVIDER_SITE_OTHER): Payer: Medicare Other

## 2016-06-24 ENCOUNTER — Telehealth: Payer: Self-pay | Admitting: Cardiology

## 2016-06-24 DIAGNOSIS — Z95810 Presence of automatic (implantable) cardiac defibrillator: Secondary | ICD-10-CM | POA: Diagnosis not present

## 2016-06-24 DIAGNOSIS — I5022 Chronic systolic (congestive) heart failure: Secondary | ICD-10-CM | POA: Diagnosis not present

## 2016-06-24 NOTE — Telephone Encounter (Signed)
Attempted to confirm remote transmission with pt. No answer and was unable to leave a message.   

## 2016-06-25 NOTE — Progress Notes (Signed)
EPIC Encounter for ICM Monitoring  Patient Name: Benjamin Cook is a 71 y.o. male Date: 06/25/2016 Primary Care Physican: Renato Shin, MD Primary Cardiologist:Crenshaw Electrophysiologist: Allred Dry Weight: unknown       Heart Failure questions reviewed, pt asymptomatic    Thoracic impedance abnormal suggesting fluid accumulation starting 06/24/2016 and 06/16/2016 to 06/19/2016.  Prescribed dosage: Furosemide 40 mg 1 tablet daily.  Potassium 20 mEq 1 tablet daily.  Labs: 09/15/2015 Creatinine 0.88, BUN 10, Potassium 4.6, Sodium 142  Recommendations: Advised to watch salt intake and limit to 2000 mg/day and fluid intake to < 2 liters/day.  Encouraged to call for fluid symptoms or use local ER for any urgent symptoms.  Follow-up plan: ICM clinic phone appointment on 07/04/2016 to recheck fluid levels.    Copy of ICM check sent to primary cardiologist and device physician.   3 month ICM trend: 06/25/2016   1 Year ICM trend:      [name], RN 06/25/2016 8:02 AM

## 2016-06-26 ENCOUNTER — Other Ambulatory Visit: Payer: Self-pay | Admitting: Internal Medicine

## 2016-06-26 NOTE — Telephone Encounter (Signed)
This is Dr. Crenshaw pt 

## 2016-06-26 NOTE — Telephone Encounter (Signed)
REFILL 

## 2016-07-02 ENCOUNTER — Other Ambulatory Visit: Payer: Self-pay | Admitting: Pharmacist

## 2016-07-02 MED ORDER — RIVAROXABAN 20 MG PO TABS: 20.0000 mg | ORAL_TABLET | Freq: Every day | ORAL | 1 refills | Status: DC

## 2016-07-04 ENCOUNTER — Ambulatory Visit (INDEPENDENT_AMBULATORY_CARE_PROVIDER_SITE_OTHER): Payer: Medicare Other

## 2016-07-04 DIAGNOSIS — I5022 Chronic systolic (congestive) heart failure: Secondary | ICD-10-CM

## 2016-07-04 DIAGNOSIS — Z95810 Presence of automatic (implantable) cardiac defibrillator: Secondary | ICD-10-CM

## 2016-07-05 ENCOUNTER — Telehealth: Payer: Self-pay

## 2016-07-05 NOTE — Progress Notes (Signed)
EPIC Encounter for ICM Monitoring  Patient Name: Benjamin Cook is a 71 y.o. male Date: 07/05/2016 Primary Care Physican: Renato Shin, MD Primary Cardiologist:Crenshaw Electrophysiologist: Allred Dry Weight: unknown                                            Attempted call to patient and unable to reach.   Transmission reviewed.    Thoracic impedance abnormal suggesting fluid accumulation starting 06/30/2016 and almost at baseline today.    Prescribed dosage: Furosemide 40 mg 1 tablet daily. Potassium 20 mEq 1 tablet daily.  Labs: 09/15/2015 Creatinine 0.88, BUN 10, Potassium 4.6, Sodium 142  Recommendations: NONE - Unable to reach patient   Follow-up plan: ICM clinic phone appointment on 08/05/2016.    Copy of ICM check sent to primary cardiologist and device physician.   3 month ICM trend: 07/04/2016   Direct Trendviewer   1 Year ICM trend:      [name], RN 07/05/2016 10:08 AM

## 2016-07-05 NOTE — Telephone Encounter (Signed)
Remote ICM transmission received.  Attempted patient call and no answer 

## 2016-07-08 ENCOUNTER — Telehealth: Payer: Self-pay | Admitting: Cardiology

## 2016-07-08 ENCOUNTER — Ambulatory Visit (INDEPENDENT_AMBULATORY_CARE_PROVIDER_SITE_OTHER): Payer: Medicare Other | Admitting: *Deleted

## 2016-07-08 DIAGNOSIS — I255 Ischemic cardiomyopathy: Secondary | ICD-10-CM | POA: Diagnosis not present

## 2016-07-08 NOTE — Telephone Encounter (Signed)
Confirmed remote transmission w/ pt wife.   

## 2016-07-09 NOTE — Progress Notes (Signed)
Remote ICD transmission.   

## 2016-07-10 LAB — CUP PACEART REMOTE DEVICE CHECK
Battery Remaining Longevity: 81 mo
Battery Voltage: 2.99 V
Brady Statistic AS VP Percent: 1 %
Brady Statistic AS VS Percent: 97 %
Date Time Interrogation Session: 20180605104549
HIGH POWER IMPEDANCE MEASURED VALUE: 80 Ohm
HIGH POWER IMPEDANCE MEASURED VALUE: 80 Ohm
Implantable Lead Location: 753860
Implantable Pulse Generator Implant Date: 20160212
Lead Channel Pacing Threshold Amplitude: 0.5 V
Lead Channel Pacing Threshold Pulse Width: 0.5 ms
Lead Channel Sensing Intrinsic Amplitude: 11.6 mV
Lead Channel Setting Pacing Amplitude: 2 V
Lead Channel Setting Sensing Sensitivity: 0.5 mV
MDC IDC LEAD IMPLANT DT: 20120420
MDC IDC LEAD IMPLANT DT: 20120420
MDC IDC LEAD LOCATION: 753859
MDC IDC MSMT BATTERY REMAINING PERCENTAGE: 75 %
MDC IDC MSMT LEADCHNL RA IMPEDANCE VALUE: 450 Ohm
MDC IDC MSMT LEADCHNL RA SENSING INTR AMPL: 2.8 mV
MDC IDC MSMT LEADCHNL RV IMPEDANCE VALUE: 1150 Ohm
MDC IDC MSMT LEADCHNL RV PACING THRESHOLD AMPLITUDE: 1 V
MDC IDC MSMT LEADCHNL RV PACING THRESHOLD PULSEWIDTH: 0.5 ms
MDC IDC SET LEADCHNL RV PACING AMPLITUDE: 2.5 V
MDC IDC SET LEADCHNL RV PACING PULSEWIDTH: 0.5 ms
MDC IDC STAT BRADY AP VP PERCENT: 1 %
MDC IDC STAT BRADY AP VS PERCENT: 1 %
MDC IDC STAT BRADY RA PERCENT PACED: 1 %
MDC IDC STAT BRADY RV PERCENT PACED: 1 %
Pulse Gen Serial Number: 7225079

## 2016-07-15 ENCOUNTER — Telehealth: Payer: Self-pay | Admitting: *Deleted

## 2016-07-15 ENCOUNTER — Telehealth: Payer: Self-pay

## 2016-07-15 DIAGNOSIS — Z95810 Presence of automatic (implantable) cardiac defibrillator: Secondary | ICD-10-CM

## 2016-07-15 NOTE — Telephone Encounter (Signed)
-----   Message from Thompson Grayer, MD sent at 07/12/2016 11:06 PM EDT ----- Remote device check reviewed.   Device report notable for new increase in RV lead impedance.  Will review with [employer].

## 2016-07-15 NOTE — Telephone Encounter (Signed)
Per Dr. Rayann Heman, patient needs appointment in device clinic to assess RV lead function sooner than 6/14 if possible.  Patient is agreeable to appointment on 07/16/16 at 3:00pm at the [facility] office, but requests that I call tomorrow morning around 8:30-9:00am to confirm with his wife.    Will plan to test isometrics/serial impedances/pocket manipulation to assess if any RV lead issues.  CXR also ordered per Dr. Rayann Heman.

## 2016-07-15 NOTE — Telephone Encounter (Signed)
Spoke with pt, informed him that Dr. Rayann Heman would like to have him come into the office to check his defibrillator, pt agreeable to appointment 07/18/16 at 2:00pm~ industry aware.

## 2016-07-16 ENCOUNTER — Ambulatory Visit
Admission: RE | Admit: 2016-07-16 | Discharge: 2016-07-16 | Disposition: A | Discharge status: Home / Self Care | Payer: Medicare Other | Source: Ambulatory Visit | Attending: Internal Medicine | Admitting: Internal Medicine

## 2016-07-16 ENCOUNTER — Encounter: Payer: Self-pay | Admitting: Cardiology

## 2016-07-16 ENCOUNTER — Encounter (HOSPITAL_COMMUNITY): Payer: Self-pay

## 2016-07-16 ENCOUNTER — Telehealth: Payer: Self-pay | Admitting: *Deleted

## 2016-07-16 ENCOUNTER — Ambulatory Visit (INDEPENDENT_AMBULATORY_CARE_PROVIDER_SITE_OTHER): Payer: Medicare Other | Admitting: *Deleted

## 2016-07-16 ENCOUNTER — Observation Stay (HOSPITAL_COMMUNITY)
Admission: RE | Admit: 2016-07-16 | Discharge: 2016-07-18 | Disposition: A | Discharge status: Home / Self Care | Payer: Medicare Other | Source: Ambulatory Visit | Attending: Internal Medicine | Admitting: Internal Medicine

## 2016-07-16 DIAGNOSIS — J449 Chronic obstructive pulmonary disease, unspecified: Secondary | ICD-10-CM | POA: Insufficient documentation

## 2016-07-16 DIAGNOSIS — I739 Peripheral vascular disease, unspecified: Secondary | ICD-10-CM | POA: Insufficient documentation

## 2016-07-16 DIAGNOSIS — T82110A Breakdown (mechanical) of cardiac electrode, initial encounter: Secondary | ICD-10-CM

## 2016-07-16 DIAGNOSIS — Z95810 Presence of automatic (implantable) cardiac defibrillator: Secondary | ICD-10-CM

## 2016-07-16 DIAGNOSIS — Z7951 Long term (current) use of inhaled steroids: Secondary | ICD-10-CM | POA: Diagnosis not present

## 2016-07-16 DIAGNOSIS — I255 Ischemic cardiomyopathy: Secondary | ICD-10-CM

## 2016-07-16 DIAGNOSIS — I5022 Chronic systolic (congestive) heart failure: Secondary | ICD-10-CM | POA: Diagnosis not present

## 2016-07-16 DIAGNOSIS — Z7901 Long term (current) use of anticoagulants: Secondary | ICD-10-CM | POA: Insufficient documentation

## 2016-07-16 DIAGNOSIS — I2510 Atherosclerotic heart disease of native coronary artery without angina pectoris: Secondary | ICD-10-CM | POA: Insufficient documentation

## 2016-07-16 DIAGNOSIS — I480 Paroxysmal atrial fibrillation: Secondary | ICD-10-CM | POA: Insufficient documentation

## 2016-07-16 DIAGNOSIS — I110 Hypertensive heart disease with heart failure: Secondary | ICD-10-CM | POA: Diagnosis not present

## 2016-07-16 DIAGNOSIS — Z955 Presence of coronary angioplasty implant and graft: Secondary | ICD-10-CM | POA: Diagnosis not present

## 2016-07-16 DIAGNOSIS — Y713 Surgical instruments, materials and cardiovascular devices (including sutures) associated with adverse incidents: Secondary | ICD-10-CM | POA: Insufficient documentation

## 2016-07-16 DIAGNOSIS — T82190A Other mechanical complication of cardiac electrode, initial encounter: Principal | ICD-10-CM | POA: Insufficient documentation

## 2016-07-16 DIAGNOSIS — Z720 Tobacco use: Secondary | ICD-10-CM | POA: Insufficient documentation

## 2016-07-16 DIAGNOSIS — E785 Hyperlipidemia, unspecified: Secondary | ICD-10-CM | POA: Insufficient documentation

## 2016-07-16 DIAGNOSIS — J9811 Atelectasis: Secondary | ICD-10-CM | POA: Insufficient documentation

## 2016-07-16 DIAGNOSIS — F419 Anxiety disorder, unspecified: Secondary | ICD-10-CM | POA: Insufficient documentation

## 2016-07-16 HISTORY — DX: Presence of automatic (implantable) cardiac defibrillator: Z95.810

## 2016-07-16 LAB — CREATININE, SERUM
Creatinine, Ser: 0.86 mg/dL (ref 0.61–1.24)
GFR calc non Af Amer: >60 mL/min (ref >60)

## 2016-07-16 LAB — CUP PACEART INCLINIC DEVICE CHECK
Battery Remaining Longevity: 81 mo
Brady Statistic RA Percent Paced: 0.02 %
Brady Statistic RV Percent Paced: 0.02 %
Implantable Lead Implant Date: 20120420
Implantable Lead Location: 753859
Implantable Lead Location: 753860
Lead Channel Sensing Intrinsic Amplitude: 11.6 mV
Lead Channel Sensing Intrinsic Amplitude: 3 mV
Lead Channel Setting Sensing Sensitivity: 0.5 mV
MDC IDC LEAD IMPLANT DT: 20120420
MDC IDC PG IMPLANT DT: 20160212
MDC IDC PG SERIAL: 7225079
MDC IDC SESS DTM: 20180612174744
MDC IDC SET LEADCHNL RA PACING AMPLITUDE: 2 V
MDC IDC SET LEADCHNL RV PACING AMPLITUDE: 5 V
MDC IDC SET LEADCHNL RV PACING PULSEWIDTH: 1 ms

## 2016-07-16 LAB — CBC
HEMATOCRIT: 40.9 % (ref 39.0–52.0)
Hemoglobin: 13.5 g/dL (ref 13.0–17.0)
MCH: 30 pg (ref 26.0–34.0)
MCHC: 33 g/dL (ref 30.0–36.0)
MCV: 90.9 fL (ref 78.0–100.0)
Platelets: 211 10*3/uL (ref 150–400)
RBC: 4.5 MIL/uL (ref 4.22–5.81)
RDW: 14.6 % (ref 11.5–15.5)
WBC: 9 10*3/uL (ref 4.0–10.5)

## 2016-07-16 MED ORDER — CARVEDILOL 25 MG PO TABS
25.0000 mg | ORAL_TABLET | Freq: Two times a day (BID) | ORAL | Status: DC
Start: 2016-07-17 — End: 2016-07-16

## 2016-07-16 MED ORDER — CARVEDILOL 25 MG PO TABS
25.0000 mg | ORAL_TABLET | Freq: Two times a day (BID) | ORAL | Status: DC
  Administered 2016-07-16 – 2016-07-18 (×4): 25 mg via ORAL
  Filled 2016-07-16 (×4): qty 1

## 2016-07-16 MED ORDER — ATORVASTATIN CALCIUM 80 MG PO TABS
80.0000 mg | ORAL_TABLET | Freq: Every day | ORAL | Status: DC
  Administered 2016-07-16 – 2016-07-17 (×2): 80 mg via ORAL
  Filled 2016-07-16 (×2): qty 1

## 2016-07-16 MED ORDER — HEPARIN SODIUM (PORCINE) 5000 UNIT/ML IJ SOLN
5000.0000 [IU] | Freq: Three times a day (TID) | INTRAMUSCULAR | Status: DC
  Administered 2016-07-16 – 2016-07-17 (×2): 5000 [IU] via SUBCUTANEOUS
  Filled 2016-07-16 (×2): qty 1

## 2016-07-16 MED ORDER — ATORVASTATIN CALCIUM 80 MG PO TABS: 80.0000 mg | ORAL_TABLET | Freq: Every day | ORAL | Status: DC

## 2016-07-16 MED ORDER — ACETAMINOPHEN 325 MG PO TABS: 650.0000 mg | ORAL_TABLET | ORAL | Status: DC | PRN

## 2016-07-16 MED ORDER — LISINOPRIL 10 MG PO TABS
20.0000 mg | ORAL_TABLET | Freq: Every day | ORAL | Status: DC
  Administered 2016-07-16 – 2016-07-17 (×2): 20 mg via ORAL
  Filled 2016-07-16 (×2): qty 2

## 2016-07-16 MED ORDER — NITROGLYCERIN 0.4 MG SL SUBL: 0.4000 mg | SUBLINGUAL_TABLET | SUBLINGUAL | Status: DC | PRN

## 2016-07-16 MED ORDER — ONDANSETRON HCL 4 MG/2ML IJ SOLN: 4.0000 mg | Freq: Four times a day (QID) | INTRAMUSCULAR | Status: DC | PRN

## 2016-07-16 MED ORDER — FUROSEMIDE 40 MG PO TABS
40.0000 mg | ORAL_TABLET | Freq: Every day | ORAL | Status: DC
  Administered 2016-07-16 – 2016-07-18 (×3): 40 mg via ORAL
  Filled 2016-07-16 (×3): qty 1

## 2016-07-16 MED ORDER — POTASSIUM CHLORIDE CRYS ER 20 MEQ PO TBCR
20.0000 meq | EXTENDED_RELEASE_TABLET | Freq: Every day | ORAL | Status: DC
  Administered 2016-07-16 – 2016-07-18 (×3): 20 meq via ORAL
  Filled 2016-07-16 (×3): qty 1

## 2016-07-16 NOTE — Telephone Encounter (Signed)
Spoke with patient's wife to advise that if the main entrance to the hospital is closed, they should enter through the emergency room entrance.  Patient's wife verbalizes understanding of information and is appreciative of call.

## 2016-07-16 NOTE — H&P (Signed)
Patient ID: Benjamin Cook MRN: [id number], DOB/AGE: [birth year]   Admit date: 07/16/2016   Primary Physician: Renato Shin, MD Cardiologist:  Dr [name] Primary Electrophysiologist: Thompson Grayer, MD  Pt. Profile:  Benjamin Cook is a 71 y.o. male with a history of coronary artery disease, ischemic cardiomyopathy s/p ICD, PAF, hypertension and hyperlipidemia who directly admitted for RV lead revision.   Cardiac history dates back to June of 2001. At that time the patient was found to have an abnormal electrocardiogram. Catheterization revealed a 95% mid LAD and a 70% diffuse right coronary artery. His ejection fraction was 25-30%. He had PCI of his LAD at that time. Abdominal ultrasound in Sept 2014 showed a 4.1x4.1 cm aneurysm and dilated right iliac. Last cardiac catheterization April 2016 showed diffuse nonobstructive coronary disease and ejection fraction 35%. Last echocardiogram February 2017 showed ejection fraction 30-35%, mild left atrial enlargement and grade 1 diastolic dysfunction  HPI: Recent device transmission 07/08/16 showed new increase in RV lead impedance. RV lead impedance measures 450ohms today, trend unstable, noise/oversensing noted on presentation and reproducible with isometric exercises. Now patient is directly admitted for RV lead revision by Dr. Lovena Le tomorrow. The patient denies nausea, vomiting, fever, chest pain, palpitations, shortness of breath, orthopnea, PND, dizziness, syncope, cough, congestion, abdominal pain, hematochezia, melena, lower extremity edema.  Problem List  Past Medical History:  Diagnosis Date  . Adrenal mass ([place])    per pt this is remote (10 years) and benign by biopsy  . Anxiety   . CAD (coronary artery disease)    a. s/p PCI to LAD 2001 b. myoview 2014 high risk with scar LAD/RCA territory but no ischemia  . Cardiomyopathy, ischemic   . CHF (congestive heart failure) ([name])    class II/III  . COPD (chronic obstructive  pulmonary disease) ([name])    smokes cigars but has quit cigarettes  . Flu 03/2015  . HTN (hypertension)   . Hyperlipidemia   . Paroxysmal atrial fibrillation ([name]) 03/2015   chads2vasc score is 4  . PSA (psoriatic arthritis) ([place])    increased  . PVD (peripheral vascular disease) ([place])     Past Surgical History:  Procedure Laterality Date  . cataract surgery    . CORONARY STENT PLACEMENT     a. PCI to LAD 2001  . IMPLANTABLE CARDIOVERTER DEFIBRILLATOR (ICD) GENERATOR CHANGE N/A 03/18/2014   a. SJM Fortify ST DR ICD implanted by [facility] for primary prevention b. gen change 03/2014   . LEFT HEART CATHETERIZATION WITH CORONARY ANGIOGRAM N/A 05/27/2014   Procedure: LEFT HEART CATHETERIZATION WITH CORONARY ANGIOGRAM;  Surgeon: Sherren Mocha, MD;  Location: [facility] CATH LAB;  Service: Cardiovascular;  Laterality: N/A;     Allergies  No Known Allergies   Home Medications  Prior to Admission medications   Medication Sig Start Date End Date Taking? Authorizing Provider  atorvastatin (LIPITOR) 80 MG tablet TAKE 1 TABLET BY MOUTH  DAILY AT 6 PM. 06/26/16  Yes Allred, Jeneen Rinks, MD  carvedilol (COREG) 25 MG tablet TAKE 1 TABLET BY MOUTH TWO  TIMES DAILY WITH A MEAL 06/26/16  Yes Allred, Jeneen Rinks, MD  Fluticasone-Salmeterol (ADVAIR) 100-50 MCG/DOSE AEPB Inhale 1 puff into the lungs 2 (two) times daily. 04/10/15  Yes Renato Shin, MD  furosemide (LASIX) 40 MG tablet TAKE 1 TABLET BY MOUTH  DAILY 06/26/16  Yes Allred, Jeneen Rinks, MD  lisinopril (PRINIVIL,ZESTRIL) 20 MG tablet TAKE 1 TABLET BY MOUTH  EVERY DAY 06/26/16  Yes Thompson Grayer, MD  Multiple  Vitamin (MULTIVITAMIN) capsule Take 1 capsule by mouth daily.     Yes [provider]  potassium chloride SA (K-DUR,KLOR-CON) 20 MEQ tablet TAKE 1 TABLET BY MOUTH  DAILY 06/26/16  Yes Allred, Jeneen Rinks, MD  rivaroxaban (XARELTO) 20 MG TABS tablet Take 1 tablet (20 mg total) by mouth daily with supper. 07/02/16  Yes Lelon Perla, MD    Family History  Family History    Problem Relation Age of Onset  . Cirrhosis Mother        due to ETOH  . Cancer Neg Hx    Family Status  Relation Status  . Mother Deceased  . Father Deceased  . Neg Hx (Not Specified)     Social History  Social History   Social History  . Marital status: Married    Spouse name: N/A  . Number of children: N/A  . Years of education: N/A   Occupational History  . Lawn care    Social History Main Topics  . Smoking status: Current Every Day Smoker    Types: Cigars  . Smokeless tobacco: Never Used     Comment: smokes cigars now, no longer smokes cigarettes but previously smoked 2ppd  . Alcohol use No  . Drug use: No  . Sexual activity: Not on file   Other Topics Concern  . Not on file   Social History Narrative  . No narrative on file      All other systems reviewed and are otherwise negative except as noted above.  Physical Exam  There were no vitals taken for this visit. The patient was seen and examined in ER during admission process.  General: Pleasant, NAD Psych: Normal affect. Neuro: Alert and oriented X 3. Moves all extremities spontaneously. HEENT: Normal  Neck: Supple without bruits or JVD. Lungs:  Resp regular and unlabored, CTA. Heart: RRR no s3, s4, or murmurs. Abdomen: Soft, non-tender, non-distended, BS + x 4.  Extremities: No clubbing, cyanosis or edema. DP/PT/Radials 2+ and equal bilaterally.  Labs  No results for input(s): CKTOTAL, CKMB, TROPONINI in the last 72 hours. Lab Results  Component Value Date   WBC 7.2 09/15/2015   HGB 15.2 09/15/2015   HCT 45.9 09/15/2015   MCV 91.8 09/15/2015   PLT 188 09/15/2015   No results for input(s): NA, K, CL, CO2, BUN, CREATININE, CALCIUM, PROT, BILITOT, ALKPHOS, ALT, AST, GLUCOSE in the last 168 hours.  Invalid input(s): LABALBU Lab Results  Component Value Date   CHOL 108 (L) 09/15/2015   HDL 44 09/15/2015   LDLCALC 54 09/15/2015   TRIG 51 09/15/2015   Lab Results  Component Value Date    DDIMER (H) 06/03/2007    1.59        AT THE INHOUSE ESTABLISHED CUTOFF VALUE OF 0.48 ug/mL FEU, THIS ASSAY HAS BEEN DOCUMENTED IN THE LITERATURE TO HAVE     ASSESSMENT AND PLAN  1. RV lead impedance  - for revision tomorrow. Hold Xarelto tonight. NPO after MN.   2 Ischemic cardiomyopathy-continue beta blocker and ACE inhibitor. Euvolemic.   3 paroxysmal atrial fibrillation-continue beta blocker. Hold Xarelto for procedure tomorrow.   4 CAD-continue statin. No chest pain or dyspnea. No aspirin given need for anticoagulation.  SignedLeanor Kail, PA-C 07/16/2016, 7:45 PM Pager [phone]   Attending note:  Patient is direct admission per Dr. Rayann Heman with plans for RV lead revision tomorrow with Dr. Lovena Le. Discussed with Benjamin Cook and agree with his above assessment. Patient had a remote ICD transmission  today that showed RV lead impedance 450 ohms, trend unstable, noise and oversensing which was reproducible with isometric exercises. Tachycardia therapies now turned off. CXR pending. Currently without active symptoms at present. Baseline labwork sent and pending as well. Dr. Lovena Le made award of patient admission.  Satira Sark, M.D., F.A.C.C.

## 2016-07-16 NOTE — Telephone Encounter (Signed)
Per patient and wife's request, spoke with patient's daughter, Sharyn Lull, to advise of Dr. Jackalyn Lombard recommendations to proceed to [facility] tonight as a direct admission for planned RV lead revision procedure tomorrow.  Sharyn Lull verbalizes understanding of information and is appreciative of explanation.

## 2016-07-16 NOTE — Patient Instructions (Addendum)
Per Dr. Rayann Heman, you will be admitted to [facility] for a planned lead revision procedure tomorrow, 07/17/16.  You should enter at the [facility] entrance off of [address] to spend at least two nights at the hospital.  - Do not take your Xarelto dose tonight.    - Ria Comment will call you with further instructions for tonight.  Please proceed to [facility] as soon as possible.

## 2016-07-16 NOTE — Progress Notes (Signed)
ICD check in clinic with industry present for elevated RV lead impedance noted on 07/08/16 remote transmission. All RA lead tests stable. RV lead impedance measures 450ohms today, trend unstable, noise/oversensing noted on presentation and reproducible with isometric exercises. RV threshold now 3.5V @ 0.74ms, increased output to 5.0V @ 1.79ms. HV lead impedance trend stable. 1 NSVT episode--noise per EGM. Per Dr. Jackalyn Lombard verbal order, reprogrammed tachy therapies OFF. Patient has a history of appropriate HV therapy for VT on 07/24/15. CXR obtained today. Plan for direct admission to [facility] today for planned RV lead revision tomorrow per Dr. Rayann Heman. Patient and wife aware and agreeable. Tommye Standard, PA, and card master made aware of plan for direct admission.  Patient and wife aware to proceed to [facility] main entrance for direct admission.  If that entrance is closed by the time they arrive, they are aware that they should proceed to the Emergency Room entrance (see phone note).  Patient verbalizes understanding of instructions to hold Xarelto dose tonight. Spoke with patient's daughter per his request to update her about plan (see phone note). Patient and wife have no additional questions or concerns at this time and are aware that MD will discuss procedure details with them tomorrow.

## 2016-07-16 NOTE — Telephone Encounter (Signed)
Confirmed with patient's wife that he is scheduled for an appointment at the [facility] office [facility] today at 3:00pm.  Gave directions to office.  Patient's wife verbalizes understanding and appreciation.  Confirmed that [employer] representative will be present at appointment.

## 2016-07-17 ENCOUNTER — Encounter (HOSPITAL_COMMUNITY)
Admission: RE | Disposition: A | Discharge status: Home / Self Care | Payer: Self-pay | Source: Ambulatory Visit | Attending: Internal Medicine

## 2016-07-17 ENCOUNTER — Encounter (HOSPITAL_COMMUNITY): Payer: Self-pay | Admitting: Nurse Practitioner

## 2016-07-17 DIAGNOSIS — T82110D Breakdown (mechanical) of cardiac electrode, subsequent encounter: Secondary | ICD-10-CM

## 2016-07-17 DIAGNOSIS — Z720 Tobacco use: Secondary | ICD-10-CM | POA: Diagnosis not present

## 2016-07-17 DIAGNOSIS — Z955 Presence of coronary angioplasty implant and graft: Secondary | ICD-10-CM | POA: Diagnosis not present

## 2016-07-17 DIAGNOSIS — Z7951 Long term (current) use of inhaled steroids: Secondary | ICD-10-CM | POA: Diagnosis not present

## 2016-07-17 DIAGNOSIS — I739 Peripheral vascular disease, unspecified: Secondary | ICD-10-CM | POA: Diagnosis not present

## 2016-07-17 DIAGNOSIS — I255 Ischemic cardiomyopathy: Secondary | ICD-10-CM | POA: Diagnosis not present

## 2016-07-17 DIAGNOSIS — E785 Hyperlipidemia, unspecified: Secondary | ICD-10-CM | POA: Diagnosis not present

## 2016-07-17 DIAGNOSIS — J449 Chronic obstructive pulmonary disease, unspecified: Secondary | ICD-10-CM | POA: Diagnosis not present

## 2016-07-17 DIAGNOSIS — I480 Paroxysmal atrial fibrillation: Secondary | ICD-10-CM | POA: Diagnosis not present

## 2016-07-17 DIAGNOSIS — I2510 Atherosclerotic heart disease of native coronary artery without angina pectoris: Secondary | ICD-10-CM | POA: Diagnosis not present

## 2016-07-17 DIAGNOSIS — T82190A Other mechanical complication of cardiac electrode, initial encounter: Secondary | ICD-10-CM | POA: Diagnosis not present

## 2016-07-17 DIAGNOSIS — I5022 Chronic systolic (congestive) heart failure: Secondary | ICD-10-CM | POA: Diagnosis not present

## 2016-07-17 DIAGNOSIS — I110 Hypertensive heart disease with heart failure: Secondary | ICD-10-CM | POA: Diagnosis not present

## 2016-07-17 DIAGNOSIS — Z7901 Long term (current) use of anticoagulants: Secondary | ICD-10-CM | POA: Diagnosis not present

## 2016-07-17 HISTORY — PX: LEAD REVISION/REPAIR: EP1213

## 2016-07-17 LAB — CBC
HCT: 38.6 % — ABNORMAL LOW (ref 39.0–52.0)
HEMOGLOBIN: 12.8 g/dL — AB (ref 13.0–17.0)
MCH: 29.9 pg (ref 26.0–34.0)
MCHC: 33.2 g/dL (ref 30.0–36.0)
MCV: 90.2 fL (ref 78.0–100.0)
PLATELETS: 208 10*3/uL (ref 150–400)
RBC: 4.28 MIL/uL (ref 4.22–5.81)
RDW: 14.8 % (ref 11.5–15.5)
WBC: 8.3 10*3/uL (ref 4.0–10.5)

## 2016-07-17 LAB — BASIC METABOLIC PANEL
Anion gap: 8 (ref 5–15)
BUN: 10 mg/dL (ref 6–20)
CALCIUM: 8.3 mg/dL — AB (ref 8.9–10.3)
CHLORIDE: 103 mmol/L (ref 101–111)
CO2: 28 mmol/L (ref 22–32)
Creatinine, Ser: 0.86 mg/dL (ref 0.61–1.24)
GFR calc Af Amer: >60 mL/min (ref >60)
GFR calc non Af Amer: >60 mL/min (ref >60)
GLUCOSE: 100 mg/dL — AB (ref 65–99)
Potassium: 3.4 mmol/L — ABNORMAL LOW (ref 3.5–5.1)
Sodium: 139 mmol/L (ref 135–145)

## 2016-07-17 LAB — SURGICAL PCR SCREEN
MRSA, PCR: NEGATIVE
STAPHYLOCOCCUS AUREUS: NEGATIVE

## 2016-07-17 SURGERY — LEAD REVISION/REPAIR

## 2016-07-17 MED ORDER — CHLORHEXIDINE GLUCONATE 4 % EX LIQD: 60.0000 mL | Freq: Once | CUTANEOUS | Status: DC

## 2016-07-17 MED ORDER — LISINOPRIL 10 MG PO TABS
30.0000 mg | ORAL_TABLET | Freq: Every day | ORAL | Status: DC
  Administered 2016-07-18: 30 mg via ORAL
  Filled 2016-07-17: qty 3

## 2016-07-17 MED ORDER — LIDOCAINE HCL (PF) 1 % IJ SOLN
INTRAMUSCULAR | Status: AC
  Filled 2016-07-17: qty 60

## 2016-07-17 MED ORDER — SODIUM CHLORIDE 0.9 % IV SOLN: INTRAVENOUS | Status: DC

## 2016-07-17 MED ORDER — ONDANSETRON HCL 4 MG/2ML IJ SOLN: 4.0000 mg | Freq: Four times a day (QID) | INTRAMUSCULAR | Status: DC | PRN

## 2016-07-17 MED ORDER — SODIUM CHLORIDE 0.9 % IR SOLN
80.0000 mg | Status: AC
  Administered 2016-07-17: 80 mg

## 2016-07-17 MED ORDER — SODIUM CHLORIDE 0.9 % IR SOLN: 80.0000 mg | Status: DC

## 2016-07-17 MED ORDER — POTASSIUM CHLORIDE CRYS ER 10 MEQ PO TBCR
10.0000 meq | EXTENDED_RELEASE_TABLET | Freq: Once | ORAL | Status: AC
  Administered 2016-07-17: 10 meq via ORAL
  Filled 2016-07-17: qty 1

## 2016-07-17 MED ORDER — FENTANYL CITRATE (PF) 100 MCG/2ML IJ SOLN
INTRAMUSCULAR | Status: DC | PRN
  Administered 2016-07-17: 25 ug via INTRAVENOUS
  Administered 2016-07-17: 12.5 ug via INTRAVENOUS

## 2016-07-17 MED ORDER — LISINOPRIL 40 MG PO TABS: 40.0000 mg | ORAL_TABLET | Freq: Every day | ORAL | Status: DC

## 2016-07-17 MED ORDER — ACETAMINOPHEN 325 MG PO TABS: 325.0000 mg | ORAL_TABLET | ORAL | Status: DC | PRN

## 2016-07-17 MED ORDER — SODIUM CHLORIDE 0.9 % IR SOLN
Status: AC
  Filled 2016-07-17: qty 2

## 2016-07-17 MED ORDER — LISINOPRIL 10 MG PO TABS
10.0000 mg | ORAL_TABLET | Freq: Once | ORAL | Status: AC
  Administered 2016-07-17: 10 mg via ORAL
  Filled 2016-07-17: qty 1

## 2016-07-17 MED ORDER — CEFAZOLIN SODIUM-DEXTROSE 2-4 GM/100ML-% IV SOLN
INTRAVENOUS | Status: AC
  Filled 2016-07-17: qty 100

## 2016-07-17 MED ORDER — CEFAZOLIN SODIUM-DEXTROSE 2-4 GM/100ML-% IV SOLN
2.0000 g | INTRAVENOUS | Status: AC
  Administered 2016-07-17: 2 g via INTRAVENOUS

## 2016-07-17 MED ORDER — CHLORHEXIDINE GLUCONATE 4 % EX LIQD
60.0000 mL | Freq: Once | CUTANEOUS | Status: DC
  Filled 2016-07-17: qty 60

## 2016-07-17 MED ORDER — LIDOCAINE HCL (PF) 1 % IJ SOLN
INTRAMUSCULAR | Status: DC | PRN
  Administered 2016-07-17: 20 mL

## 2016-07-17 MED ORDER — HEPARIN (PORCINE) IN NACL 2-0.9 UNIT/ML-% IJ SOLN
INTRAMUSCULAR | Status: AC | PRN
  Administered 2016-07-17: 500 mL

## 2016-07-17 MED ORDER — FENTANYL CITRATE (PF) 100 MCG/2ML IJ SOLN
INTRAMUSCULAR | Status: AC
  Filled 2016-07-17: qty 2

## 2016-07-17 MED ORDER — MIDAZOLAM HCL 5 MG/5ML IJ SOLN
INTRAMUSCULAR | Status: AC
  Filled 2016-07-17: qty 5

## 2016-07-17 MED ORDER — HEPARIN (PORCINE) IN NACL 2-0.9 UNIT/ML-% IJ SOLN
INTRAMUSCULAR | Status: AC
  Filled 2016-07-17: qty 500

## 2016-07-17 MED ORDER — CEFAZOLIN SODIUM-DEXTROSE 1-4 GM/50ML-% IV SOLN
1.0000 g | Freq: Four times a day (QID) | INTRAVENOUS | Status: AC
  Administered 2016-07-17 – 2016-07-18 (×3): 1 g via INTRAVENOUS
  Filled 2016-07-17 (×3): qty 50

## 2016-07-17 MED ORDER — MIDAZOLAM HCL 5 MG/5ML IJ SOLN
INTRAMUSCULAR | Status: DC | PRN
  Administered 2016-07-17 (×3): 1 mg via INTRAVENOUS

## 2016-07-17 MED ORDER — LISINOPRIL 10 MG PO TABS: 20.0000 mg | ORAL_TABLET | Freq: Once | ORAL | Status: DC

## 2016-07-17 MED ORDER — CHLORHEXIDINE GLUCONATE 4 % EX LIQD
60.0000 mL | Freq: Once | CUTANEOUS | Status: AC
  Administered 2016-07-17: 4 via TOPICAL

## 2016-07-17 MED ORDER — CEFAZOLIN SODIUM-DEXTROSE 2-4 GM/100ML-% IV SOLN: 2.0000 g | INTRAVENOUS | Status: DC

## 2016-07-17 MED ORDER — SODIUM CHLORIDE 0.9 % IV SOLN
INTRAVENOUS | Status: AC | PRN
  Administered 2016-07-17: 50 mL/h via INTRAVENOUS

## 2016-07-17 SURGICAL SUPPLY — 6 items
CABLE SURGICAL S-101-97-12 (CABLE) ×3 IMPLANT
GUIDEWIRE ANGLED .035X150CM (WIRE) ×3 IMPLANT
LEAD DURATA 7122Q-65CM (Lead) ×3 IMPLANT
PAD DEFIB LIFELINK (PAD) ×3 IMPLANT
SHEATH CLASSIC 7F (SHEATH) ×3 IMPLANT
TRAY PACEMAKER INSERTION (PACKS) ×3 IMPLANT

## 2016-07-17 NOTE — Care Management Obs Status (Signed)
[facility] NOTIFICATION   Patient Details  Name: Benjamin [name] MRN: [id number] Date of Birth: 1945/05/30   Medicare Observation Status Notification Given:  Yes    [name], RN 07/17/2016, 4:22 PM

## 2016-07-17 NOTE — Progress Notes (Signed)
Progress Note  Patient Name: Benjamin [name] Date of Encounter: 07/17/2016  Primary Cardiologist: [name]  Subjective   Feels well. No chest pain or sob  Inpatient Medications    Scheduled Meds: . atorvastatin  80 mg Oral q1800  . carvedilol  25 mg Oral BID WC  . chlorhexidine  60 mL Topical Once  . chlorhexidine  60 mL Topical Once  . chlorhexidine  60 mL Topical Once  . chlorhexidine  60 mL Topical Once  . furosemide  40 mg Oral Daily  . gentamicin irrigation  80 mg Irrigation On Call  . lisinopril  20 mg Oral Daily  . potassium chloride  10 mEq Oral Once  . potassium chloride SA  20 mEq Oral Daily   Continuous Infusions: . sodium chloride    . sodium chloride    . sodium chloride    .  ceFAZolin (ANCEF) IV     PRN Meds: acetaminophen, nitroGLYCERIN, ondansetron (ZOFRAN) IV   Vital Signs    Vitals:   07/16/16 2031 07/17/16 0423  BP: (!) 154/60 130/62  Pulse: 66 (!) 58  Resp: 18 18  Temp: 98.8 F (37.1 C) 98.6 F (37 C)  TempSrc: Oral Oral  SpO2: 97% 98%  Weight: 145 lb 6.4 oz (66 kg)   Height: 5\' 11"  (1.803 m)     Intake/Output Summary (Last 24 hours) at 07/17/16 0756 Last data filed at 07/16/16 2250  Gross per 24 hour  Intake              480 ml  Output                0 ml  Net              480 ml   Filed Weights   07/16/16 2031  Weight: 145 lb 6.4 oz (66 kg)    Telemetry    nsr - Personally Reviewed  ECG    nsr - Personally Reviewed  Physical Exam   GEN: No acute distress.   Neck: 6 cm JVD Cardiac: RRR, no murmurs, rubs, or gallops.  Respiratory: [name] to auscultation bilaterally. GI: Soft, nontender, non-distended  MS: No edema; No deformity. Neuro:  Nonfocal  Psych: Normal affect   Labs    Chemistry Recent Labs Lab 07/16/16 2101 07/17/16 0248  NA  --  139  K  --  3.4*  CL  --  103  CO2  --  28  GLUCOSE  --  100*  BUN  --  10  CREATININE 0.86 0.86  CALCIUM  --  8.3*  GFRNONAA >60 >60  GFRAA >60 >60  ANIONGAP   --  8     Hematology Recent Labs Lab 07/16/16 2101 07/17/16 0248  WBC 9.0 8.3  RBC 4.50 4.28  HGB 13.5 12.8*  HCT 40.9 38.6*  MCV 90.9 90.2  MCH 30.0 29.9  MCHC 33.0 33.2  RDW 14.6 14.8  PLT 211 208    Cardiac EnzymesNo results for input(s): TROPONINI in the last 168 hours. No results for input(s): TROPIPOC in the last 168 hours.   BNPNo results for input(s): BNP, PROBNP in the last 168 hours.   DDimer No results for input(s): DDIMER in the last 168 hours.   Radiology    No results found.  Cardiac Studies   ICD interogation demonstrates impending failure of ICD lead  Patient Profile     71 y.o. male admitted with impending failure of his ICD resulting in reproducible  lead noise.  Assessment & Plan    1. ICD lead failure - he will undergo insertion of a new ICD lead later today, hopefully home tomorrow.  2. CAD/ischemic CM - he denies chest pain or other symptoms. 3. Chronic systolic heart failure - he appears euvolemic.  Signed, Cristopher Peru, MD  07/17/2016, 7:56 AM  Patient ID: Benjamin [name], male   DOB: December 14, 1945, 71 y.o.   MRN: [id number]

## 2016-07-18 ENCOUNTER — Encounter (HOSPITAL_COMMUNITY): Payer: Self-pay

## 2016-07-18 ENCOUNTER — Observation Stay (HOSPITAL_COMMUNITY): Payer: Medicare Other

## 2016-07-18 DIAGNOSIS — Z7901 Long term (current) use of anticoagulants: Secondary | ICD-10-CM | POA: Diagnosis not present

## 2016-07-18 DIAGNOSIS — E785 Hyperlipidemia, unspecified: Secondary | ICD-10-CM | POA: Diagnosis not present

## 2016-07-18 DIAGNOSIS — I5022 Chronic systolic (congestive) heart failure: Secondary | ICD-10-CM | POA: Diagnosis not present

## 2016-07-18 DIAGNOSIS — J449 Chronic obstructive pulmonary disease, unspecified: Secondary | ICD-10-CM | POA: Diagnosis not present

## 2016-07-18 DIAGNOSIS — Z7951 Long term (current) use of inhaled steroids: Secondary | ICD-10-CM | POA: Diagnosis not present

## 2016-07-18 DIAGNOSIS — I739 Peripheral vascular disease, unspecified: Secondary | ICD-10-CM | POA: Diagnosis not present

## 2016-07-18 DIAGNOSIS — I255 Ischemic cardiomyopathy: Secondary | ICD-10-CM

## 2016-07-18 DIAGNOSIS — I480 Paroxysmal atrial fibrillation: Secondary | ICD-10-CM | POA: Diagnosis not present

## 2016-07-18 DIAGNOSIS — Z720 Tobacco use: Secondary | ICD-10-CM | POA: Diagnosis not present

## 2016-07-18 DIAGNOSIS — I110 Hypertensive heart disease with heart failure: Secondary | ICD-10-CM | POA: Diagnosis not present

## 2016-07-18 DIAGNOSIS — J9811 Atelectasis: Secondary | ICD-10-CM | POA: Diagnosis not present

## 2016-07-18 DIAGNOSIS — I2510 Atherosclerotic heart disease of native coronary artery without angina pectoris: Secondary | ICD-10-CM | POA: Diagnosis not present

## 2016-07-18 DIAGNOSIS — T82190A Other mechanical complication of cardiac electrode, initial encounter: Secondary | ICD-10-CM | POA: Diagnosis not present

## 2016-07-18 DIAGNOSIS — Z955 Presence of coronary angioplasty implant and graft: Secondary | ICD-10-CM | POA: Diagnosis not present

## 2016-07-18 DIAGNOSIS — Z95810 Presence of automatic (implantable) cardiac defibrillator: Secondary | ICD-10-CM | POA: Diagnosis not present

## 2016-07-18 NOTE — Discharge Instructions (Signed)
. ° ° °  Supplemental Discharge Instructions for  Pacemaker/Defibrillator Patients  Activity No heavy lifting or vigorous activity with your left/right arm for 6 to 8 weeks.  Do not raise your left/right arm above your head for one week.  Gradually raise your affected arm as drawn below.              07/21/16                    07/22/16                     07/23/16                  07/24/16 __  NO DRIVING for 1 week   ; you may begin driving on  7/62/26   .  WOUND CARE - Keep the wound area clean and dry.  Do not get this area wet for one week. No showers for one week; you may shower on  07/24/16   . - The tape/steri-strips on your wound will fall off; do not pull them off.  No bandage is needed on the site.  DO  NOT apply any creams, oils, or ointments to the wound area. - If you notice any drainage or discharge from the wound, any swelling or bruising at the site, or you develop a fever > 101? F after you are discharged home, call the office at once.  Special Instructions - You are still able to use cellular telephones; use the ear opposite the side where you have your pacemaker/defibrillator.  Avoid carrying your cellular phone near your device. - When traveling through airports, show security personnel your identification card to avoid being screened in the metal detectors.  Ask the security personnel to use the hand wand. - Avoid arc welding equipment, MRI testing (magnetic resonance imaging), TENS units (transcutaneous nerve stimulators).  Call the office for questions about other devices. - Avoid electrical appliances that are in poor condition or are not properly grounded. - Microwave ovens are safe to be near or to operate.  Additional information for defibrillator patients should your device go off: - If your device goes off ONCE and you feel fine afterward, notify the device clinic nurses. - If your device goes off ONCE and you do not feel well afterward, call 911. - If your device  goes off TWICE, call 911. - If your device goes off THREE times in one day, call 911.  DO NOT DRIVE YOURSELF OR A FAMILY MEMBER WITH A DEFIBRILLATOR TO THE HOSPITAL--CALL 911.

## 2016-07-18 NOTE — Discharge Summary (Signed)
ELECTROPHYSIOLOGY PROCEDURE DISCHARGE SUMMARY    Patient ID: Benjamin Cook,  MRN: [id number], DOB/AGE: 1945/12/09 71 y.o.  Admit date: 07/16/2016 Discharge date: 07/18/2016  Primary Care Physician: Renato Shin, MD  Primary Cardiologist: Dr. [name] Electrophysiologist: Dr. Rayann Heman  Primary Discharge Diagnosis: 1. ICD lead failure  Secondary Discharge Diagnosis:  1. ICM w/ICD 2. CAD 3. Paroxysmal AFib     CHA2DS2Vasc is 4, on Xarelto 4. HTN 5. VT     No Known Allergies   Procedures This Admission:  1.  Implantation of a new ICD RV lead on 07/17/16 by Dr Lovena Le.  The patient received a [employer] active fixation single coil defibrillation lead, serial [id number],  There were no immediate post procedure complications. 2.  CXR on 04/17/16 demonstrated no pneumothorax status post device implantation.   Brief HPI: Benjamin Cook is a 71 y.o. male was found to have an acute change in his RV lead impedance, as well as noise, he was seen by the device clinic 07/16/16 and found to have a fractured RV lead.  Therapies were suspended and given his hx of VT, referred to the hospital to be admitted and planned for new RV lead.  Past medical history includes as abve.     Hospital Course:  The patient was admitted and the follow day underwent implantation of anew RV lead with details as outlined above. He was monitored on telemetry overnight which demonstrated SR, V pacing.  Left chest was without hematoma or ecchymosis.  The device was interrogated and found to be functioning normally.  CXR was obtained and demonstrated no pneumothorax status post device implantation.  Wound care, arm mobility, and restrictions were reviewed with the patient.  The patient was examined by Dr. Lovena Le and considered stable for discharge to home. We will resume his Xarelto Saturday.  The patient BP was elevated yesterday, though better on arrival, he states yesterday he was quite anxious about the  procedure and doing OK afterwards,  He checks his BP at home and typically 140/70, will not make any changes, he is asked to keep an eye on it at home.   Physical Exam: Vitals:   07/17/16 1600 07/17/16 1824 07/17/16 2002 07/18/16 0342  BP: (!) 152/80 (!) 142/78 (!) 147/83 (!) 168/71  Pulse: 62 67 66 64  Resp:   18 18  Temp:   98.9 F (37.2 C) 98.5 F (36.9 C)  TempSrc:   Oral Oral  SpO2:   97% 95%  Weight:      Height:        GEN- The patient is well appearing, alert and oriented x 3 today.   HEENT: normocephalic, atraumatic; sclera clear, conjunctiva pink; hearing intact; oropharynx clear Lungs- CTA b/l, normal work of breathing.  No wheezes, rales, rhonchi Heart-  RRR, no murmurs, rubs or gallops, PMI not laterally displaced GI- soft, non-tender Extremities- no clubbing, cyanosis, or edema MS- no significant deformity or atrophy Skin- warm and dry, no rash or lesion, left chest without hematoma/ecchymosis Psych- euthymic mood, full affect Neuro- no gross defecits  Labs:   Lab Results  Component Value Date   WBC 8.3 07/17/2016   HGB 12.8 (L) 07/17/2016   HCT 38.6 (L) 07/17/2016   MCV 90.2 07/17/2016   PLT 208 07/17/2016     Recent Labs Lab 07/17/16 0248  NA 139  K 3.4*  CL 103  CO2 28  BUN 10  CREATININE 0.86  CALCIUM 8.3*  GLUCOSE 100*  Discharge Medications:  Allergies as of 07/18/2016   No Known Allergies     Medication List    TAKE these medications   atorvastatin 80 MG tablet Commonly known as:  LIPITOR TAKE 1 TABLET BY MOUTH  DAILY AT 6 PM.   carvedilol 25 MG tablet Commonly known as:  COREG TAKE 1 TABLET BY MOUTH TWO  TIMES DAILY WITH A MEAL   Fluticasone-Salmeterol 100-50 MCG/DOSE Aepb Commonly known as:  ADVAIR Inhale 1 puff into the lungs 2 (two) times daily.   furosemide 40 MG tablet Commonly known as:  LASIX TAKE 1 TABLET BY MOUTH  DAILY   lisinopril 20 MG tablet Commonly known as:  PRINIVIL,ZESTRIL TAKE 1 TABLET BY MOUTH   EVERY DAY   multivitamin capsule Take 1 capsule by mouth daily.   potassium chloride SA 20 MEQ tablet Commonly known as:  K-DUR,KLOR-CON TAKE 1 TABLET BY MOUTH  DAILY   rivaroxaban 20 MG Tabs tablet Commonly known as:  XARELTO Take 1 tablet (20 mg total) by mouth daily with supper. Notes to patient:  Do not take Thursday, Friday, resume on Saturday 07/20/16       Disposition:  Home Discharge Instructions    Diet - low sodium heart healthy    Complete by:  As directed    Increase activity slowly    Complete by:  As directed      Follow-up Information    [facility] Follow up on 07/25/2016.   Specialty:  Cardiology Why:  9:00AM, wound check Contact information: [address], [place]       Thompson Grayer, MD Follow up on 10/17/2016.   Specialty:  Cardiology Why:  9:45AM Contact information: [place] [address] [phone]           Duration of Discharge Encounter: Greater than 30 minutes including physician time.  Venetia Night, PA-C 07/18/2016 10:00 AM  EP Attending  Patient seen and examined. Agree with above. The patient is doing well after ICD lead revision. [place] for DC home. Usual followup with Dr. Greggory Brandy.   Mikle Bosworth.D.

## 2016-07-25 ENCOUNTER — Ambulatory Visit (INDEPENDENT_AMBULATORY_CARE_PROVIDER_SITE_OTHER): Payer: Medicare Other | Admitting: *Deleted

## 2016-07-25 DIAGNOSIS — I255 Ischemic cardiomyopathy: Secondary | ICD-10-CM | POA: Diagnosis not present

## 2016-07-25 LAB — CUP PACEART INCLINIC DEVICE CHECK
Brady Statistic RV Percent Paced: 0.16 %
HighPow Impedance: 63 Ohm
Implantable Lead Implant Date: 20120420
Implantable Lead Implant Date: 20160212
Implantable Lead Location: 753859
Implantable Pulse Generator Implant Date: 20160212
Lead Channel Impedance Value: 637.5 Ohm
Lead Channel Pacing Threshold Amplitude: 0.5 V
Lead Channel Pacing Threshold Amplitude: 0.5 V
Lead Channel Pacing Threshold Pulse Width: 0.5 ms
Lead Channel Sensing Intrinsic Amplitude: 11.6 mV
Lead Channel Setting Pacing Amplitude: 2 V
Lead Channel Setting Pacing Pulse Width: 0.5 ms
Lead Channel Setting Sensing Sensitivity: 0.5 mV
MDC IDC LEAD LOCATION: 753860
MDC IDC MSMT BATTERY REMAINING LONGEVITY: 79 mo
MDC IDC MSMT LEADCHNL RA IMPEDANCE VALUE: 387.5 Ohm
MDC IDC MSMT LEADCHNL RA PACING THRESHOLD PULSEWIDTH: 0.5 ms
MDC IDC MSMT LEADCHNL RA SENSING INTR AMPL: 2.4 mV
MDC IDC PG SERIAL: 7225079
MDC IDC SESS DTM: 20180621123510
MDC IDC SET LEADCHNL RV PACING AMPLITUDE: 3.5 V
MDC IDC STAT BRADY RA PERCENT PACED: 12 %

## 2016-07-25 NOTE — Progress Notes (Signed)
Wound check appointment. Steri-strips removed. Wound with slight yellow/green to purple ecchymosis around incision edges, patient and patient's wife educated on signs and symptoms of active bleeding. No redness or edema. No drainage. Incision edges approximated, wound well healed. Normal device function. Thresholds, sensing, and impedances consistent with implant measurements. RA programmed at chronic output and RV programmed at 3.5V for extra safety margin until 3 month visit s/p RV lead revision. Histogram distribution appropriate for patient and level of activity. CorVue appears possible fluid accumulation since June 12, but trending back to baseline. No mode switches or ventricular arrhythmias noted. Patient educated about wound care (including signs and symptoms of infection and active bleeding), arm mobility, lifting restrictions, shock plan. ROV with JA 10/17/16.

## 2016-08-01 ENCOUNTER — Encounter: Payer: Self-pay | Admitting: Cardiology

## 2016-08-05 ENCOUNTER — Telehealth: Payer: Self-pay | Admitting: Cardiology

## 2016-08-05 ENCOUNTER — Ambulatory Visit (INDEPENDENT_AMBULATORY_CARE_PROVIDER_SITE_OTHER): Payer: Medicare Other

## 2016-08-05 DIAGNOSIS — I5022 Chronic systolic (congestive) heart failure: Secondary | ICD-10-CM

## 2016-08-05 DIAGNOSIS — Z95810 Presence of automatic (implantable) cardiac defibrillator: Secondary | ICD-10-CM

## 2016-08-05 NOTE — Telephone Encounter (Signed)
Spoke with pt and reminded pt of remote transmission that is due today. Pt verbalized understanding.   

## 2016-08-06 ENCOUNTER — Telehealth: Payer: Self-pay

## 2016-08-06 NOTE — Telephone Encounter (Signed)
Remote ICM transmission received.  Attempted patient call and left message to return call.   

## 2016-08-06 NOTE — Progress Notes (Signed)
EPIC Encounter for ICM Monitoring  Patient Name: Benjamin Cook is a 71 y.o. male Date: 08/06/2016 Primary Care Physican: Renato Shin, MD Primary Cardiologist:Crenshaw Electrophysiologist: Allred Dry Weight: unknown      Attempted call to patient and unable to reach.  Left message to return call.  Transmission reviewed.    Thoracic impedance normal but was abnormal suggesting fluid accumulation from 07/17/2016 to 07/27/2016.  Prescribed dosage: Furosemide 40 mg 1 tablet daily. Potassium 20 mEq 1 tablet daily.  Labs: 07/17/2016 Creatinine 0.86, BUN 10, Potassium 3.4, Sodium 39, EGFR >60 09/15/2015 Creatinine 0.88, BUN 10, Potassium 4.6, Sodium 142  Recommendations: NONE - Unable to reach patient   Follow-up plan: ICM clinic phone appointment on 09/05/2016.  Office appointment scheduled 10/17/2016 with Dr. Rayann Heman.  Copy of ICM check sent to device physician.   3 month ICM trend: 08/06/2016   1 Year ICM trend:      [name], RN 08/06/2016 8:34 AM

## 2016-09-05 ENCOUNTER — Ambulatory Visit (INDEPENDENT_AMBULATORY_CARE_PROVIDER_SITE_OTHER): Payer: Medicare Other

## 2016-09-05 DIAGNOSIS — Z95810 Presence of automatic (implantable) cardiac defibrillator: Secondary | ICD-10-CM | POA: Diagnosis not present

## 2016-09-05 DIAGNOSIS — I5022 Chronic systolic (congestive) heart failure: Secondary | ICD-10-CM

## 2016-09-06 NOTE — Progress Notes (Signed)
EPIC Encounter for ICM Monitoring  Patient Name: Benjamin Cook is a 71 y.o. male Date: 09/06/2016 Primary Care Physican: Ellison, Sean, MD Primary Cardiologist:Crenshaw Electrophysiologist: Allred Dry Weight: unknown                                                   Spoke with wife due to patient was sleeping. Heart Failure questions reviewed, pt asymptomatic.   Thoracic impedance normal but was abnormal suggesting fluid accumulation from 08/20/2016 to 08/24/2016.  Prescribed dosage: Furosemide 40 mg 1 tablet daily. Potassium 20 mEq 1 tablet daily.  Labs: 07/17/2016 Creatinine 0.86, BUN 10, Potassium 3.4, Sodium 39, EGFR >60 09/15/2015 Creatinine 0.88, BUN 10, Potassium 4.6, Sodium 142  Recommendations: No changes.  Advised to limit salt intake to 2000 mg/day and fluid intake to < 2 liters/day.  Encouraged to call for fluid symptoms.  Follow-up plan: ICM clinic phone appointment on 10/08/2016.  Office appointment scheduled 10/17/2016 with Dr. Allred.  Copy of ICM check sent to device physician.   3 month ICM trend: 09/05/2016   1 Year ICM trend:      Laurie S Short, RN 09/06/2016 12:08 PM    

## 2016-09-27 ENCOUNTER — Encounter: Payer: Self-pay | Admitting: Internal Medicine

## 2016-10-08 ENCOUNTER — Ambulatory Visit (INDEPENDENT_AMBULATORY_CARE_PROVIDER_SITE_OTHER): Payer: Medicare Other | Admitting: *Deleted

## 2016-10-08 ENCOUNTER — Telehealth: Payer: Self-pay

## 2016-10-08 DIAGNOSIS — I255 Ischemic cardiomyopathy: Secondary | ICD-10-CM

## 2016-10-08 DIAGNOSIS — Z95810 Presence of automatic (implantable) cardiac defibrillator: Secondary | ICD-10-CM

## 2016-10-08 DIAGNOSIS — I5022 Chronic systolic (congestive) heart failure: Secondary | ICD-10-CM

## 2016-10-08 NOTE — Progress Notes (Signed)
ICD Remote transmission 

## 2016-10-08 NOTE — Telephone Encounter (Signed)
Remote ICM transmission received.  Attempted call to patient and no message, no answering machine

## 2016-10-08 NOTE — Progress Notes (Signed)
EPIC Encounter for ICM Monitoring  Patient Name: Benjamin Cook is a 71 y.o. male Date: 10/08/2016 Primary Care Physican: Renato Shin, MD Primary Cardiologist:Crenshaw Electrophysiologist: Allred Dry Weight: unknown      Attempted call to patient and unable to reach.   Transmission reviewed.    Thoracic impedance normal.  Prescribed dosage: Furosemide 40 mg 1 tablet daily. Potassium 20 mEq 1 tablet daily.  Labs: 07/17/2016 Creatinine 0.86, BUN 10, Potassium 3.4, Sodium 39, EGFR >60 09/15/2015 Creatinine 0.88, BUN 10, Potassium 4.6, Sodium 142  Recommendations: NONE - Unable to reach patient   Follow-up plan: ICM clinic phone appointment on 11/19/2016.  Office appointment scheduled 10/17/2016 with Dr. Rayann Heman.  Copy of ICM check sent to Dr. Rayann Heman.   3 month ICM trend: 10/08/2016   1 Year ICM trend:      [name], RN 10/08/2016 2:21 PM

## 2016-10-09 ENCOUNTER — Telehealth: Payer: Self-pay | Admitting: Internal Medicine

## 2016-10-09 MED ORDER — RIVAROXABAN 20 MG PO TABS: 20.0000 mg | ORAL_TABLET | Freq: Every day | ORAL | 5 refills | Status: DC

## 2016-10-09 NOTE — Telephone Encounter (Signed)
F/u Message  Pt call to f/u on Xarelto alternative. Please call back to discuss

## 2016-10-09 NOTE — Telephone Encounter (Addendum)
**Note De-Identified Benjamin Cook Obfuscation** I called [facility] and spoke with Talia concerning a tier exception on Xarelto. Tanja Port confirmed that it will cost the pt $225.14 for a 90 day supply as Xarelto is a tier 3 drug.  Per Tanja Port the tier exception is denied because Xarelto is a brand named drug and it cannot be reduced in price as it is at the lowest price possible under the plan that the pt is in. Tanja Port gave me the following information to be considered for this pt:  Pradaxa will cost more as it is a tier 4 drug Eliquis is a tier 3 and will cost the pt the same amount as Xarelto Savaysa is not on the formulary  Warfarin is a tier 1 which will cost considerably less.

## 2016-10-09 NOTE — Telephone Encounter (Signed)
Pt last saw Dr [name] 09/15/15, pt has appt scheduled for 10/17/16 with Dr Rayann Heman, last labs 07/17/16 Creat 0.86, weight 66kg, age 71, CrCl 71.41, based on CrCl pt is on appropriate dosage of Xarelto 20mg  QD.  Will refill rx.

## 2016-10-09 NOTE — Telephone Encounter (Signed)
Benjamin Cook is calling because the price of The Xarelto is very expensive( $250.00 a month )  and is wanting to know if there is something less expensive in which he can take or can he go back to taking his aspirin daily  . Please call

## 2016-10-09 NOTE — Telephone Encounter (Signed)
New message     *STAT* If patient is at the pharmacy, call can be transferred to refill team.   1. Which medications need to be refilled? (please list name of each medication and dose if known) rivaroxaban (XARELTO) 20 MG TABS tablet  2. Which pharmacy/location (including street and city if local pharmacy) is medication to be sent to? [facility] [address]  3. Do they need a 30 day or 90 day supply? PATIENT ONLY WANTS 1 MONTH SUPPLY

## 2016-10-09 NOTE — Telephone Encounter (Signed)
Tried to call wife back but did not get an answer to discuss

## 2016-10-16 ENCOUNTER — Encounter: Payer: Self-pay | Admitting: Cardiology

## 2016-10-17 ENCOUNTER — Encounter: Payer: Medicare Other | Admitting: Internal Medicine

## 2016-10-21 LAB — CUP PACEART REMOTE DEVICE CHECK
Battery Remaining Longevity: 60 mo
Battery Remaining Percentage: 72 %
Date Time Interrogation Session: 20180917150546
HIGH POWER IMPEDANCE MEASURED VALUE: 72 Ohm
Implantable Lead Implant Date: 20180613
Implantable Lead Location: 753859
Implantable Pulse Generator Implant Date: 20160212
Lead Channel Impedance Value: 910 Ohm
Lead Channel Sensing Intrinsic Amplitude: 11.6 mV
Lead Channel Setting Sensing Sensitivity: 0.5 mV
MDC IDC LEAD IMPLANT DT: 20120420
MDC IDC LEAD LOCATION: 753860
MDC IDC MSMT LEADCHNL RA IMPEDANCE VALUE: 380 Ohm
MDC IDC MSMT LEADCHNL RA SENSING INTR AMPL: 2.3 mV
MDC IDC PG SERIAL: 7225079
MDC IDC SET LEADCHNL RA PACING AMPLITUDE: 2 V
MDC IDC SET LEADCHNL RV PACING AMPLITUDE: 3.5 V
MDC IDC SET LEADCHNL RV PACING PULSEWIDTH: 0.5 ms
MDC IDC STAT BRADY RA PERCENT PACED: 8.6 %
MDC IDC STAT BRADY RV PERCENT PACED: 1 % — AB

## 2016-10-22 ENCOUNTER — Other Ambulatory Visit: Payer: Self-pay | Admitting: Internal Medicine

## 2016-10-22 NOTE — Telephone Encounter (Signed)
This is Dr. Crenshaw's pt 

## 2016-10-28 ENCOUNTER — Ambulatory Visit (INDEPENDENT_AMBULATORY_CARE_PROVIDER_SITE_OTHER): Payer: Medicare Other | Admitting: Internal Medicine

## 2016-10-28 ENCOUNTER — Encounter: Payer: Self-pay | Admitting: Internal Medicine

## 2016-10-28 ENCOUNTER — Other Ambulatory Visit: Payer: Self-pay | Admitting: *Deleted

## 2016-10-28 VITALS — BP 130/70 | HR 73 | Ht 71.0 in | Wt 150.0 lb

## 2016-10-28 DIAGNOSIS — T82110A Breakdown (mechanical) of cardiac electrode, initial encounter: Secondary | ICD-10-CM | POA: Diagnosis not present

## 2016-10-28 DIAGNOSIS — I480 Paroxysmal atrial fibrillation: Secondary | ICD-10-CM

## 2016-10-28 DIAGNOSIS — I5082 Biventricular heart failure: Secondary | ICD-10-CM | POA: Diagnosis not present

## 2016-10-28 DIAGNOSIS — I255 Ischemic cardiomyopathy: Secondary | ICD-10-CM

## 2016-10-28 DIAGNOSIS — I110 Hypertensive heart disease with heart failure: Secondary | ICD-10-CM

## 2016-10-28 DIAGNOSIS — I714 Abdominal aortic aneurysm, without rupture, unspecified: Secondary | ICD-10-CM

## 2016-10-28 DIAGNOSIS — I5022 Chronic systolic (congestive) heart failure: Secondary | ICD-10-CM

## 2016-10-28 LAB — CUP PACEART INCLINIC DEVICE CHECK
HighPow Impedance: 70.875
Implantable Lead Implant Date: 20180613
Implantable Pulse Generator Implant Date: 20160212
Lead Channel Impedance Value: 375 Ohm
Lead Channel Impedance Value: 925 Ohm
Lead Channel Pacing Threshold Amplitude: 0.5 V
Lead Channel Pacing Threshold Amplitude: 0.5 V
Lead Channel Pacing Threshold Amplitude: 0.5 V
Lead Channel Pacing Threshold Pulse Width: 0.5 ms
Lead Channel Pacing Threshold Pulse Width: 0.5 ms
Lead Channel Sensing Intrinsic Amplitude: 2.3 mV
Lead Channel Setting Pacing Amplitude: 2.5 V
Lead Channel Setting Pacing Pulse Width: 0.5 ms
MDC IDC LEAD IMPLANT DT: 20120420
MDC IDC LEAD LOCATION: 753859
MDC IDC LEAD LOCATION: 753860
MDC IDC MSMT BATTERY REMAINING LONGEVITY: 76 mo
MDC IDC MSMT LEADCHNL RA PACING THRESHOLD AMPLITUDE: 0.5 V
MDC IDC MSMT LEADCHNL RA PACING THRESHOLD PULSEWIDTH: 0.5 ms
MDC IDC MSMT LEADCHNL RA PACING THRESHOLD PULSEWIDTH: 0.5 ms
MDC IDC MSMT LEADCHNL RV SENSING INTR AMPL: 11.6 mV
MDC IDC SESS DTM: 20180924094910
MDC IDC SET LEADCHNL RA PACING AMPLITUDE: 2 V
MDC IDC SET LEADCHNL RV SENSING SENSITIVITY: 0.5 mV
MDC IDC STAT BRADY RA PERCENT PACED: 9 %
MDC IDC STAT BRADY RV PERCENT PACED: 0.27 %
Pulse Gen Serial Number: 7225079

## 2016-10-28 NOTE — Patient Instructions (Addendum)
Medication Instructions:  Your physician recommends that you continue on your current medications as directed. Please refer to the Current Medication list given to you today.   Labwork: None ordered   Testing/Procedures: None ordered   Follow-Up:   ICM remote monitoring with Sharman Cheek, RN 11/19/16      Remote monitoring is used to monitor your  ICD from home. This monitoring reduces the number of office visits required to check your device to one time per year. It allows [place] to keep an eye on the functioning of your device to ensure it is working properly. You are scheduled for a device check from home on 01/20/17. You may send your transmission at any time that day. If you have a wireless device, the transmission will be sent automatically. After your physician reviews your transmission, you will receive a postcard with your next transmission date.  Your physician wants you to follow-up in: 6 months with Dr [name] and 12 months with [place] will receive a reminder letter in the mail two months in advance. If you don't receive a letter, please call our office to schedule the follow-up appointment.     Thank you for choosing [facility]!!     Janan Halter, RN [phone]

## 2016-10-28 NOTE — Progress Notes (Signed)
PCP: Renato Shin, MD Primary Cardiologist:  Dr [name] Primary EP: Dr Jeralyn Ruths is a 71 y.o. male who presents today for routine electrophysiology followup.  Since last being seen in our clinic, the patient reports doing very well.  He is s/p RV lead revision for fracture picked up on remote interrogation 07/18/11.  He is still mowing about 25 lawns in the summers.  Today, he denies symptoms of palpitations, chest pain, shortness of breath,  lower extremity edema, dizziness, presyncope, syncope, or ICD shocks.  The patient is otherwise without complaint today.   Past Medical History:  Diagnosis Date  . Adrenal mass ([place])    per pt this is remote (10 years) and benign by biopsy  . AICD (automatic cardioverter/defibrillator) present   . Anxiety   . CAD (coronary artery disease)    a. s/p PCI to LAD 2001 b. myoview 2014 high risk with scar LAD/RCA territory but no ischemia  . Cardiomyopathy, ischemic   . CHF (congestive heart failure) ([name])    class II/III  . COPD (chronic obstructive pulmonary disease) ([name])    smokes cigars but has quit cigarettes  . Flu 03/2015  . HTN (hypertension)   . Hyperlipidemia   . Paroxysmal atrial fibrillation ([name]) 03/2015   chads2vasc score is 4  . PSA (psoriatic arthritis) ([place])    increased  . PVD (peripheral vascular disease) ([place])    Past Surgical History:  Procedure Laterality Date  . cataract surgery    . CORONARY ANGIOPLASTY WITH STENT PLACEMENT  2001   a. PCI to LAD  . IMPLANTABLE CARDIOVERTER DEFIBRILLATOR (ICD) GENERATOR CHANGE N/A 03/18/2014   a. SJM Fortify ST DR ICD implanted by [facility] for primary prevention b. gen change 03/2014   . LEAD REVISION/REPAIR N/A 07/17/2016   Procedure: Lead Revision/Repair;  Surgeon: Evans Lance, MD;  Location: [facility];  Service: Cardiovascular;  Laterality: N/A;  . LEFT HEART CATHETERIZATION WITH CORONARY ANGIOGRAM N/A 05/27/2014   Procedure: LEFT HEART CATHETERIZATION WITH  CORONARY ANGIOGRAM;  Surgeon: Sherren Mocha, MD;  Location: [facility] CATH LAB;  Service: Cardiovascular;  Laterality: N/A;    ROS- all systems are reviewed and negative except as per HPI above  Current Outpatient Prescriptions  Medication Sig Dispense Refill  . atorvastatin (LIPITOR) 80 MG tablet TAKE 1 TABLET BY MOUTH  DAILY AT 6 PM. 90 tablet 0  . carvedilol (COREG) 25 MG tablet TAKE 1 TABLET BY MOUTH TWO  TIMES DAILY WITH A MEAL 180 tablet 0  . Fluticasone-Salmeterol (ADVAIR) 100-50 MCG/DOSE AEPB Inhale 1 puff into the lungs 2 (two) times daily. 1 each 3  . furosemide (LASIX) 40 MG tablet TAKE 1 TABLET BY MOUTH  DAILY 90 tablet 0  . lisinopril (PRINIVIL,ZESTRIL) 20 MG tablet TAKE 1 TABLET BY MOUTH  EVERY DAY 90 tablet 0  . Multiple Vitamin (MULTIVITAMIN) capsule Take 1 capsule by mouth daily.      . potassium chloride SA (K-DUR,KLOR-CON) 20 MEQ tablet TAKE 1 TABLET BY MOUTH  DAILY 90 tablet 0  . rivaroxaban (XARELTO) 20 MG TABS tablet Take 1 tablet (20 mg total) by mouth daily with supper. 30 tablet 5   No current facility-administered medications for this visit.     Physical Exam: Vitals:   10/28/16 0931  BP: 130/70  Pulse: 73  SpO2: 96%  Weight: 150 lb (68 kg)  Height: 5\' 11"  (1.803 m)    GEN- The patient is well appearing, alert and oriented x 3  today.   Head- normocephalic, atraumatic Eyes-  Sclera clear, conjunctiva pink Ears- hearing intact Oropharynx- clear Lungs- Clear to ausculation bilaterally, normal work of breathing Chest- ICD pocket is well healed Heart- Regular rate and rhythm with ectopy, no murmurs, rubs or gallops, PMI not laterally displaced GI- soft, NT, ND, + BS Extremities- no clubbing, cyanosis, or edema  ICD interrogation- reviewed in detail today,  See PACEART report  ekg tracing ordered today is personally reviewed and shows atrial paced rhythm, RBBB  Assessment and Plan:  1.  Chronic systolic dysfunction/ Ischemic CM euvolemic today No ischemic  symptoms Stable on an appropriate medical regimen Normal ICD function See Pace Art report No changes today  2. Hypertensive cardiovascular disease Stable No change required today  3. Tobacco Cessation advised He is not ready to quit  4. Paroxysmal atrial fibrillation Asymptomatic, no real AF in the past year chads2vaswc score is 4 On xarelto  [place] Return to see EP NP in a year Follow-up with Dr [name] as scheduled  Thompson Grayer MD, [facility] 10/28/2016 9:43 AM

## 2016-11-15 ENCOUNTER — Emergency Department (HOSPITAL_COMMUNITY): Payer: Medicare Other

## 2016-11-15 ENCOUNTER — Encounter (HOSPITAL_COMMUNITY): Payer: Self-pay | Admitting: Emergency Medicine

## 2016-11-15 ENCOUNTER — Inpatient Hospital Stay (HOSPITAL_COMMUNITY)
Admission: EM | Admit: 2016-11-15 | Discharge: 2016-11-18 | DRG: 392 | Disposition: A | Discharge status: Home / Self Care | Payer: Medicare Other | Attending: Internal Medicine | Admitting: Internal Medicine

## 2016-11-15 DIAGNOSIS — R197 Diarrhea, unspecified: Secondary | ICD-10-CM | POA: Diagnosis not present

## 2016-11-15 DIAGNOSIS — J449 Chronic obstructive pulmonary disease, unspecified: Secondary | ICD-10-CM | POA: Diagnosis not present

## 2016-11-15 DIAGNOSIS — I714 Abdominal aortic aneurysm, without rupture, unspecified: Secondary | ICD-10-CM | POA: Diagnosis present

## 2016-11-15 DIAGNOSIS — I2510 Atherosclerotic heart disease of native coronary artery without angina pectoris: Secondary | ICD-10-CM | POA: Diagnosis present

## 2016-11-15 DIAGNOSIS — Z79899 Other long term (current) drug therapy: Secondary | ICD-10-CM | POA: Diagnosis not present

## 2016-11-15 DIAGNOSIS — L4050 Arthropathic psoriasis, unspecified: Secondary | ICD-10-CM | POA: Diagnosis present

## 2016-11-15 DIAGNOSIS — K5732 Diverticulitis of large intestine without perforation or abscess without bleeding: Secondary | ICD-10-CM | POA: Diagnosis not present

## 2016-11-15 DIAGNOSIS — Z955 Presence of coronary angioplasty implant and graft: Secondary | ICD-10-CM

## 2016-11-15 DIAGNOSIS — I5022 Chronic systolic (congestive) heart failure: Secondary | ICD-10-CM | POA: Diagnosis not present

## 2016-11-15 DIAGNOSIS — Z7901 Long term (current) use of anticoagulants: Secondary | ICD-10-CM | POA: Diagnosis not present

## 2016-11-15 DIAGNOSIS — K5792 Diverticulitis of intestine, part unspecified, without perforation or abscess without bleeding: Secondary | ICD-10-CM | POA: Diagnosis present

## 2016-11-15 DIAGNOSIS — E860 Dehydration: Secondary | ICD-10-CM | POA: Diagnosis present

## 2016-11-15 DIAGNOSIS — I252 Old myocardial infarction: Secondary | ICD-10-CM

## 2016-11-15 DIAGNOSIS — T82110A Breakdown (mechanical) of cardiac electrode, initial encounter: Secondary | ICD-10-CM | POA: Diagnosis present

## 2016-11-15 DIAGNOSIS — I480 Paroxysmal atrial fibrillation: Secondary | ICD-10-CM | POA: Diagnosis present

## 2016-11-15 DIAGNOSIS — E785 Hyperlipidemia, unspecified: Secondary | ICD-10-CM | POA: Diagnosis present

## 2016-11-15 DIAGNOSIS — I739 Peripheral vascular disease, unspecified: Secondary | ICD-10-CM | POA: Diagnosis not present

## 2016-11-15 DIAGNOSIS — F17290 Nicotine dependence, other tobacco product, uncomplicated: Secondary | ICD-10-CM | POA: Diagnosis present

## 2016-11-15 DIAGNOSIS — E279 Disorder of adrenal gland, unspecified: Secondary | ICD-10-CM | POA: Diagnosis present

## 2016-11-15 DIAGNOSIS — I255 Ischemic cardiomyopathy: Secondary | ICD-10-CM | POA: Diagnosis present

## 2016-11-15 DIAGNOSIS — F419 Anxiety disorder, unspecified: Secondary | ICD-10-CM | POA: Diagnosis present

## 2016-11-15 DIAGNOSIS — I110 Hypertensive heart disease with heart failure: Secondary | ICD-10-CM | POA: Diagnosis not present

## 2016-11-15 DIAGNOSIS — I744 Embolism and thrombosis of arteries of extremities, unspecified: Secondary | ICD-10-CM | POA: Diagnosis not present

## 2016-11-15 DIAGNOSIS — Z95810 Presence of automatic (implantable) cardiac defibrillator: Secondary | ICD-10-CM | POA: Diagnosis present

## 2016-11-15 DIAGNOSIS — F17200 Nicotine dependence, unspecified, uncomplicated: Secondary | ICD-10-CM | POA: Diagnosis present

## 2016-11-15 LAB — CBC
HCT: 41.9 % (ref 39.0–52.0)
Hemoglobin: 14 g/dL (ref 13.0–17.0)
MCH: 29.9 pg (ref 26.0–34.0)
MCHC: 33.4 g/dL (ref 30.0–36.0)
MCV: 89.5 fL (ref 78.0–100.0)
PLATELETS: 163 10*3/uL (ref 150–400)
RBC: 4.68 MIL/uL (ref 4.22–5.81)
RDW: 15.3 % (ref 11.5–15.5)
WBC: 18.9 10*3/uL — ABNORMAL HIGH (ref 4.0–10.5)

## 2016-11-15 LAB — URINALYSIS, ROUTINE W REFLEX MICROSCOPIC
BILIRUBIN URINE: NEGATIVE
Glucose, UA: NEGATIVE mg/dL
Ketones, ur: NEGATIVE mg/dL
Leukocytes, UA: NEGATIVE
Nitrite: NEGATIVE
PH: 5 (ref 5.0–8.0)
Protein, ur: NEGATIVE mg/dL
SPECIFIC GRAVITY, URINE: 1.02 (ref 1.005–1.030)

## 2016-11-15 LAB — COMPREHENSIVE METABOLIC PANEL
ALK PHOS: 70 U/L (ref 38–126)
ALT: 15 U/L — AB (ref 17–63)
AST: 19 U/L (ref 15–41)
Albumin: 3.4 g/dL — ABNORMAL LOW (ref 3.5–5.0)
Anion gap: 8 (ref 5–15)
BUN: 7 mg/dL (ref 6–20)
CALCIUM: 9.3 mg/dL (ref 8.9–10.3)
CHLORIDE: 104 mmol/L (ref 101–111)
CO2: 24 mmol/L (ref 22–32)
CREATININE: 0.71 mg/dL (ref 0.61–1.24)
GFR calc non Af Amer: >60 mL/min (ref >60)
GLUCOSE: 112 mg/dL — AB (ref 65–99)
Potassium: 4 mmol/L (ref 3.5–5.1)
SODIUM: 136 mmol/L (ref 135–145)
Total Bilirubin: 1.3 mg/dL — ABNORMAL HIGH (ref 0.3–1.2)
Total Protein: 7.4 g/dL (ref 6.5–8.1)

## 2016-11-15 LAB — LIPASE, BLOOD: LIPASE: 17 U/L (ref 11–51)

## 2016-11-15 MED ORDER — MORPHINE SULFATE (PF) 4 MG/ML IV SOLN
4.0000 mg | Freq: Once | INTRAVENOUS | Status: AC
  Administered 2016-11-15: 4 mg via INTRAVENOUS
  Filled 2016-11-15: qty 1

## 2016-11-15 MED ORDER — ONDANSETRON HCL 4 MG PO TABS: 4.0000 mg | ORAL_TABLET | Freq: Four times a day (QID) | ORAL | Status: DC | PRN

## 2016-11-15 MED ORDER — ACETAMINOPHEN 650 MG RE SUPP: 650.0000 mg | Freq: Four times a day (QID) | RECTAL | Status: DC | PRN

## 2016-11-15 MED ORDER — SODIUM CHLORIDE 0.9 % IV SOLN
250.0000 mL | INTRAVENOUS | Status: DC | PRN
Start: 2016-11-15 — End: 2016-11-18

## 2016-11-15 MED ORDER — ATORVASTATIN CALCIUM 80 MG PO TABS
80.0000 mg | ORAL_TABLET | Freq: Every day | ORAL | Status: DC
  Administered 2016-11-15 – 2016-11-17 (×3): 80 mg via ORAL
  Filled 2016-11-15 (×3): qty 1

## 2016-11-15 MED ORDER — LISINOPRIL 20 MG PO TABS: 20.0000 mg | ORAL_TABLET | Freq: Every day | ORAL | Status: DC

## 2016-11-15 MED ORDER — MORPHINE SULFATE (PF) 4 MG/ML IV SOLN: 2.0000 mg | INTRAVENOUS | Status: DC | PRN

## 2016-11-15 MED ORDER — ONDANSETRON HCL 4 MG/2ML IJ SOLN
4.0000 mg | Freq: Once | INTRAMUSCULAR | Status: AC
  Administered 2016-11-15: 4 mg via INTRAVENOUS
  Filled 2016-11-15: qty 2

## 2016-11-15 MED ORDER — ADULT MULTIVITAMIN W/MINERALS CH
1.0000 | ORAL_TABLET | Freq: Every day | ORAL | Status: DC
  Administered 2016-11-15 – 2016-11-18 (×4): 1 via ORAL
  Filled 2016-11-15 (×4): qty 1

## 2016-11-15 MED ORDER — MOMETASONE FURO-FORMOTEROL FUM 100-5 MCG/ACT IN AERO
2.0000 | INHALATION_SPRAY | Freq: Two times a day (BID) | RESPIRATORY_TRACT | Status: DC
  Administered 2016-11-15 – 2016-11-18 (×6): 2 via RESPIRATORY_TRACT
  Filled 2016-11-15: qty 8.8

## 2016-11-15 MED ORDER — SODIUM CHLORIDE 0.9% FLUSH: 3.0000 mL | INTRAVENOUS | Status: DC | PRN

## 2016-11-15 MED ORDER — SODIUM CHLORIDE 0.9 % IV BOLUS (SEPSIS)
1000.0000 mL | Freq: Once | INTRAVENOUS | Status: AC
  Administered 2016-11-15: 1000 mL via INTRAVENOUS

## 2016-11-15 MED ORDER — IOPAMIDOL (ISOVUE-300) INJECTION 61%
INTRAVENOUS | Status: AC
  Administered 2016-11-15: 100 mL
  Filled 2016-11-15: qty 100

## 2016-11-15 MED ORDER — SODIUM CHLORIDE 0.9% FLUSH
3.0000 mL | Freq: Two times a day (BID) | INTRAVENOUS | Status: DC
  Administered 2016-11-15 – 2016-11-16 (×3): 3 mL via INTRAVENOUS

## 2016-11-15 MED ORDER — HYDRALAZINE HCL 20 MG/ML IJ SOLN: 10.0000 mg | Freq: Four times a day (QID) | INTRAMUSCULAR | Status: DC | PRN

## 2016-11-15 MED ORDER — OXYCODONE HCL 5 MG PO TABS
5.0000 mg | ORAL_TABLET | ORAL | Status: DC | PRN
  Administered 2016-11-16: 5 mg via ORAL
  Filled 2016-11-15: qty 1

## 2016-11-15 MED ORDER — TRAZODONE HCL 50 MG PO TABS
50.0000 mg | ORAL_TABLET | Freq: Every evening | ORAL | Status: DC | PRN
  Administered 2016-11-18: 50 mg via ORAL
  Filled 2016-11-15: qty 1

## 2016-11-15 MED ORDER — PIPERACILLIN-TAZOBACTAM 3.375 G IVPB
3.3750 g | Freq: Three times a day (TID) | INTRAVENOUS | Status: DC
  Administered 2016-11-15 – 2016-11-17 (×5): 3.375 g via INTRAVENOUS
  Filled 2016-11-15 (×6): qty 50

## 2016-11-15 MED ORDER — RIVAROXABAN 20 MG PO TABS
20.0000 mg | ORAL_TABLET | Freq: Every day | ORAL | Status: DC
  Administered 2016-11-16 – 2016-11-17 (×2): 20 mg via ORAL
  Filled 2016-11-15 (×2): qty 1

## 2016-11-15 MED ORDER — ALBUTEROL SULFATE (2.5 MG/3ML) 0.083% IN NEBU: 2.5000 mg | INHALATION_SOLUTION | RESPIRATORY_TRACT | Status: DC | PRN

## 2016-11-15 MED ORDER — ONDANSETRON HCL 4 MG/2ML IJ SOLN: 4.0000 mg | Freq: Four times a day (QID) | INTRAMUSCULAR | Status: DC | PRN

## 2016-11-15 MED ORDER — SODIUM CHLORIDE 0.9 % IV SOLN
INTRAVENOUS | Status: DC
  Administered 2016-11-15 – 2016-11-17 (×2): via INTRAVENOUS

## 2016-11-15 MED ORDER — MULTIVITAMINS PO CAPS: 1.0000 | ORAL_CAPSULE | Freq: Every day | ORAL | Status: DC

## 2016-11-15 MED ORDER — PIPERACILLIN-TAZOBACTAM 3.375 G IVPB 30 MIN
3.3750 g | Freq: Once | INTRAVENOUS | Status: AC
Start: 2016-11-15 — End: 2016-11-15
  Administered 2016-11-15: 3.375 g via INTRAVENOUS
  Filled 2016-11-15: qty 50

## 2016-11-15 MED ORDER — POTASSIUM CHLORIDE CRYS ER 20 MEQ PO TBCR
20.0000 meq | EXTENDED_RELEASE_TABLET | Freq: Every day | ORAL | Status: DC
  Administered 2016-11-16 – 2016-11-17 (×2): 20 meq via ORAL
  Filled 2016-11-15 (×2): qty 1

## 2016-11-15 MED ORDER — ACETAMINOPHEN 325 MG PO TABS: 650.0000 mg | ORAL_TABLET | Freq: Four times a day (QID) | ORAL | Status: DC | PRN

## 2016-11-15 NOTE — H&P (Signed)
Patient Demographics:    Benjamin Cook, is a 71 y.o. male  MRN: [id number]   DOB - Dec 10, 1945  Admit Date - 11/15/2016  Outpatient Primary MD for the patient is Renato Shin, MD   Assessment & Plan:    Principal Problem:   Acute Sigmoid diverticulitis Active Problems:   TOBACCO ABUSE   Cardiomyopathy, ischemic   Aneurysm of abdominal vessel (5.6 CM)   ICD (implantable cardioverter-defibrillator) in place   Paroxysmal atrial fibrillation ([place])   ICD (implantable cardioverter-defibrillator) lead failure   Diverticulitis    1)Acute Sigmoid Diverticulitis- Continue IV Zosyn as ordered by ED provider pending cultures, clear liquid diet , when necessary pain medications, white count is 18.9, no high fevers,   2)AAA - infrarenal aortic aneurysm now measures 5.6 cm up from 3.1, pt also has right common iliac and right proximal internal iliac aneurysms measuring 2.2 cm,  patient is largely asymptomatic from this at this time,  vascular surgery consult from Dr. Servando Snare requested  3)Afib- continue Coreg 25 mg twice a day for rate control, continue Xarelto for stroke prophylaxis  4)HFrEF/CAD/AICD- patient with history of ischemic cardiomyopathy/chronic systolic dysfunction CHF with last known EF around 30-35%, no acute exacerbation of CHF symptoms at this time, actually patient appears somewhat dry secondary to bouts of diarrhea and poor oral intake, hold Lasix until patient is euvolemic, patient has history of CAD with previous angioplasty and stent placement, no ACS symptoms at this time, okay to continue Coreg, Lipitor and lisinopril as ordered   5)Tobacco Use/COPD- no acute COPD flareup at this time, continue Dulera, patient's smokes cigars, patient is not interested in smoking cessation at this time   DVT  prophylaxis-Xarelto Dispo-possible discharge home with wife in a couple of days if improves    With History of - Reviewed by me  Past Medical History:  Diagnosis Date  . Adrenal mass ([place])    per pt this is remote (10 years) and benign by biopsy  . AICD (automatic cardioverter/defibrillator) present   . Anxiety   . CAD (coronary artery disease)    a. s/p PCI to LAD 2001 b. myoview 2014 high risk with scar LAD/RCA territory but no ischemia  . Cardiomyopathy, ischemic   . CHF (congestive heart failure) ([name])    class II/III  . COPD (chronic obstructive pulmonary disease) ([name])    smokes cigars but has quit cigarettes  . Flu 03/2015  . HTN (hypertension)   . Hyperlipidemia   . Paroxysmal atrial fibrillation ([name]) 03/2015   chads2vasc score is 4  . PSA (psoriatic arthritis) ([place])    increased  . PVD (peripheral vascular disease) ([place])       Past Surgical History:  Procedure Laterality Date  . cataract surgery    . CORONARY ANGIOPLASTY WITH STENT PLACEMENT  2001   a. PCI to LAD  . IMPLANTABLE CARDIOVERTER DEFIBRILLATOR (ICD) GENERATOR CHANGE N/A 03/18/2014   a. [place]  ICD implanted by [facility] for primary prevention b. gen change 03/2014   . LEAD REVISION/REPAIR N/A 07/17/2016   Procedure: Lead Revision/Repair;  Surgeon: Evans Lance, MD;  Location: [facility] CV LAB;  Service: Cardiovascular;  Laterality: N/A;  . LEFT HEART CATHETERIZATION WITH CORONARY ANGIOGRAM N/A 05/27/2014   Procedure: LEFT HEART CATHETERIZATION WITH CORONARY ANGIOGRAM;  Surgeon: Sherren Mocha, MD;  Location: [facility] CATH LAB;  Service: Cardiovascular;  Laterality: N/A;      Chief Complaint  Patient presents with  . Abdominal Pain  . Diarrhea      HPI:    Benjamin Cook  is a 71 y.o. male, With past medical history relevant for ischemic myopathy with last known EF of 35%, afebrile, history of AAA, tobacco use disorder and COPD who presents with a 24-hour history of loose stools, abdominal pain  and nausea.  Low-grade temps but no chills no high fevers. Nausea but no vomiting. Last colonoscopy more than 5 years ago, Diarrhea with watery stools (> 12 loose BMs in the last 24 hours),  but no blood or mucus  In ED..... Patient is found to have generalized lower quadrant tenderness, CT abdomen suggested sigmoid diverticulitis, leukocytosis with a white count of 18.9 k noted, ED provider initiated IV Zosyn and fluids  Patient's wife is at bedside, additional history verified. No sick contacts at home    Review of systems:    In addition to the HPI above,   A full 12 point Review of 10 Systems was done, except as stated above, all other Review of 10 Systems were negative.    Social History:  Reviewed by me    Social History  Substance Use Topics  . Smoking status: Current Every Day Smoker    Types: Cigars  . Smokeless tobacco: Never Used     Comment: smokes cigars now, no longer smokes cigarettes but previously smoked 2ppd  . Alcohol use No       Family History :  Reviewed by me    Family History  Problem Relation Age of Onset  . Cirrhosis Mother        due to ETOH  . Cancer Neg Hx      Home Medications:   Prior to Admission medications   Medication Sig Start Date End Date Taking? Authorizing Provider  atorvastatin (LIPITOR) 80 MG tablet TAKE 1 TABLET BY MOUTH  DAILY AT 6 PM. Patient taking differently: TAKE 80mg  BY MOUTH  DAILY AT 6 PM. 10/23/16  Yes Allred, Jeneen Rinks, MD  carvedilol (COREG) 25 MG tablet TAKE 1 TABLET BY MOUTH TWO  TIMES DAILY WITH A MEAL Patient taking differently: TAKE 25mg  BY MOUTH TWO  TIMES DAILY WITH A MEAL 10/23/16  Yes Allred, Jeneen Rinks, MD  Fluticasone-Salmeterol (ADVAIR) 100-50 MCG/DOSE AEPB Inhale 1 puff into the lungs 2 (two) times daily. 04/10/15  Yes Renato Shin, MD  furosemide (LASIX) 40 MG tablet TAKE 1 TABLET BY MOUTH  DAILY Patient taking differently: TAKE 40mg  BY MOUTH  DAILY 10/23/16  Yes Allred, Jeneen Rinks, MD  lisinopril  (PRINIVIL,ZESTRIL) 20 MG tablet TAKE 1 TABLET BY MOUTH  EVERY DAY Patient taking differently: TAKE 20mg   BY MOUTH  EVERY DAY 10/23/16  Yes Allred, Jeneen Rinks, MD  Multiple Vitamin (MULTIVITAMIN) capsule Take 1 capsule by mouth daily.     Yes [provider]  potassium chloride SA (K-DUR,KLOR-CON) 20 MEQ tablet TAKE 1 TABLET BY MOUTH  DAILY Patient taking differently: TAKE 50meq BY MOUTH once DAILY 10/23/16  Yes Allred, Jeneen Rinks,  MD  rivaroxaban (XARELTO) 20 MG TABS tablet Take 1 tablet (20 mg total) by mouth daily with supper. 10/09/16  Yes Allred, Jeneen Rinks, MD     Allergies:    No Known Allergies   Physical Exam:   Vitals  Blood pressure (!) 149/71, pulse 79, temperature 98.9 F (37.2 C), temperature source Oral, resp. rate 18, SpO2 95 %.  Physical Examination: General appearance - alert, well appearing, and in no distress   Mental status - alert, oriented to person, place, and time,  Eyes - sclera anicteric Neck - supple, no JVD elevation , Chest - clear  to auscultation bilaterally, symmetrical air movement,  Heart - S1 and S2 normal, AICD in situ Abdomen - soft,  nondistended, generalized lower abdominal tenderness, worse in the left lower quadrant Neurological - screening mental status exam normal, neck supple without rigidity, cranial nerves II through XII intact, DTR's normal and symmetric Extremities - no pedal edema noted, intact peripheral pulses  Skin - warm, dry    Data Review:    CBC  Recent Labs Lab 11/15/16 0827  WBC 18.9*  HGB 14.0  HCT 41.9  PLT 163  MCV 89.5  MCH 29.9  MCHC 33.4  RDW 15.3   ------------------------------------------------------------------------------------------------------------------  Chemistries   Recent Labs Lab 11/15/16 0827  NA 136  K 4.0  CL 104  CO2 24  GLUCOSE 112*  BUN 7  CREATININE 0.71  CALCIUM 9.3  AST 19  ALT 15*  ALKPHOS 70  BILITOT 1.3*    ------------------------------------------------------------------------------------------------------------------ CrCl cannot be calculated (Unknown ideal weight.). ------------------------------------------------------------------------------------------------------------------ No results for input(s): TSH, T4TOTAL, T3FREE, THYROIDAB in the last 72 hours.  Invalid input(s): FREET3   Coagulation profile No results for input(s): INR, PROTIME in the last 168 hours. ------------------------------------------------------------------------------------------------------------------- No results for input(s): DDIMER in the last 72 hours. -------------------------------------------------------------------------------------------------------------------  Cardiac Enzymes No results for input(s): CKMB, TROPONINI, MYOGLOBIN in the last 168 hours.  Invalid input(s): CK ------------------------------------------------------------------------------------------------------------------ No results found for: BNP   ---------------------------------------------------------------------------------------------------------------  Urinalysis    Component Value Date/Time   COLORURINE YELLOW 11/15/2016 0837   APPEARANCEUR HAZY (A) 11/15/2016 0837   LABSPEC 1.020 11/15/2016 0837   PHURINE 5.0 11/15/2016 0837   GLUCOSEU NEGATIVE 11/15/2016 0837   GLUCOSEU NEGATIVE 04/10/2015 1220   HGBUR MODERATE (A) 11/15/2016 0837   BILIRUBINUR NEGATIVE 11/15/2016 0837   KETONESUR NEGATIVE 11/15/2016 0837   PROTEINUR NEGATIVE 11/15/2016 0837   UROBILINOGEN 0.2 04/10/2015 1220   NITRITE NEGATIVE 11/15/2016 0837   LEUKOCYTESUR NEGATIVE 11/15/2016 0837    ----------------------------------------------------------------------------------------------------------------   Imaging Results:    Ct Abdomen Pelvis W Contrast  Result Date: 11/15/2016 CLINICAL DATA:  Suprapubic pain and diarrhea. EXAM: CT ABDOMEN AND  PELVIS WITH CONTRAST TECHNIQUE: Multidetector CT imaging of the abdomen and pelvis was performed using the standard protocol following bolus administration of intravenous contrast. CONTRAST:  161mL ISOVUE-300 IOPAMIDOL (ISOVUE-300) INJECTION 61% COMPARISON:  CT abdomen pelvis dated April 13, 2003. FINDINGS: Lower chest: No acute abnormality.  Cardiomegaly. Hepatobiliary: No focal liver abnormality is seen. No gallstones, gallbladder wall thickening, or biliary dilatation. Pancreas: Unremarkable. No pancreatic ductal dilatation or surrounding inflammatory changes. Spleen: Normal in size without focal abnormality. Adrenals/Urinary Tract: Unchanged 4.1 cm right adrenal mass, likely an adenoma. The left adrenal gland is unremarkable. Subcentimeter low-density lesions in both kidneys are too small to characterize. No hydronephrosis. No ureteral calculi. The bladder is partially decompressed. Stomach/Bowel: Sigmoid colonic wall thickening with inflammatory changes surrounding an inflamed diverticulum. Small hiatal hernia. The stomach is otherwise  within normal limits. No bowel obstruction. Normal appendix. Vascular/Lymphatic: Severe aortic atherosclerosis with interval increase in size of fusiform infrarenal abdominal aortic aneurysm, now measuring up to 5.6 cm in maximum dimension, previously 3.1 cm. Interval increase in size of ectatic bilateral common iliac arteries. The right common iliac artery measures up to 2.2 cm. Mild aneurysmal dilatation of the proximal right internal iliac artery. Severe atherosclerosis of the mid to distal SMA. No lymphadenopathy. Reproductive: Markedly prostate enlargement with prominent central gland hypertrophy. Other: Trace free fluid in the pelvis. No pneumoperitoneum. Small fat containing bilateral inguinal hernias. Musculoskeletal: No acute or significant osseous findings. Severe degenerative changes of the lower lumbar spine. IMPRESSION: 1. Acute, uncomplicated sigmoid diverticulitis.  2. Interval increase in size of fusiform, infrarenal abdominal aortic aneurysm, measuring up to 5.6 cm. Vascular surgery consultation recommended due to increased risk of rupture for AAA >5.5 cm. This recommendation follows ACR consensus guidelines: White Paper of the ACR Incidental Findings Committee II on Vascular Findings. J Am Coll Radiol 2013; 10:789-794. 3. Ectasia of the bilateral common iliac arteries, increased when compared to prior study. Mild aneurysmal dilatation of the proximal right internal iliac artery. 4.  Aortic atherosclerosis (ICD10-I70.0). 5. Marked prostatomegaly with prominent central gland hypertrophy. 6. Unchanged 4.1 cm right adrenal mass, likely a benign adenoma. Electronically Signed   By: Titus Dubin M.D.   On: 11/15/2016 10:40    Radiological Exams on Admission: Ct Abdomen Pelvis W Contrast  Result Date: 11/15/2016 CLINICAL DATA:  Suprapubic pain and diarrhea. EXAM: CT ABDOMEN AND PELVIS WITH CONTRAST TECHNIQUE: Multidetector CT imaging of the abdomen and pelvis was performed using the standard protocol following bolus administration of intravenous contrast. CONTRAST:  174mL ISOVUE-300 IOPAMIDOL (ISOVUE-300) INJECTION 61% COMPARISON:  CT abdomen pelvis dated April 13, 2003. FINDINGS: Lower chest: No acute abnormality.  Cardiomegaly. Hepatobiliary: No focal liver abnormality is seen. No gallstones, gallbladder wall thickening, or biliary dilatation. Pancreas: Unremarkable. No pancreatic ductal dilatation or surrounding inflammatory changes. Spleen: Normal in size without focal abnormality. Adrenals/Urinary Tract: Unchanged 4.1 cm right adrenal mass, likely an adenoma. The left adrenal gland is unremarkable. Subcentimeter low-density lesions in both kidneys are too small to characterize. No hydronephrosis. No ureteral calculi. The bladder is partially decompressed. Stomach/Bowel: Sigmoid colonic wall thickening with inflammatory changes surrounding an inflamed diverticulum.  Small hiatal hernia. The stomach is otherwise within normal limits. No bowel obstruction. Normal appendix. Vascular/Lymphatic: Severe aortic atherosclerosis with interval increase in size of fusiform infrarenal abdominal aortic aneurysm, now measuring up to 5.6 cm in maximum dimension, previously 3.1 cm. Interval increase in size of ectatic bilateral common iliac arteries. The right common iliac artery measures up to 2.2 cm. Mild aneurysmal dilatation of the proximal right internal iliac artery. Severe atherosclerosis of the mid to distal SMA. No lymphadenopathy. Reproductive: Markedly prostate enlargement with prominent central gland hypertrophy. Other: Trace free fluid in the pelvis. No pneumoperitoneum. Small fat containing bilateral inguinal hernias. Musculoskeletal: No acute or significant osseous findings. Severe degenerative changes of the lower lumbar spine. IMPRESSION: 1. Acute, uncomplicated sigmoid diverticulitis. 2. Interval increase in size of fusiform, infrarenal abdominal aortic aneurysm, measuring up to 5.6 cm. Vascular surgery consultation recommended due to increased risk of rupture for AAA >5.5 cm. This recommendation follows ACR consensus guidelines: White Paper of the ACR Incidental Findings Committee II on Vascular Findings. J Am Coll Radiol 2013; 10:789-794. 3. Ectasia of the bilateral common iliac arteries, increased when compared to prior study. Mild aneurysmal dilatation of the proximal right internal iliac  artery. 4.  Aortic atherosclerosis (ICD10-I70.0). 5. Marked prostatomegaly with prominent central gland hypertrophy. 6. Unchanged 4.1 cm right adrenal mass, likely a benign adenoma. Electronically Signed   By: Titus Dubin M.D.   On: 11/15/2016 10:40    DVT Prophylaxis -SCD/Xarelto AM Labs Ordered, also please review Full Orders  Family Communication: Admission, patients condition and plan of care including tests being ordered have been discussed with the patient and wife who  indicate understanding and agree with the plan   Code Status - Full Code  Likely DC to  home  Condition   stable  Anayely Constantine M.D on 11/15/2016 at 3:41 PM   Between 7am to 7pm - Pager - [phone] After 7pm go to [email or web address] - password [email or web address]  [facility] - Office  [phone]  Voice Recognition Viviann Spare dictation system was used to create this note, attempts have been made to correct errors. Please contact the author with questions and/or clarifications.

## 2016-11-15 NOTE — ED Provider Notes (Signed)
[facility] DEPT Provider Note   CSN: [id number] Arrival date & time: 11/15/16  5631     History   Chief Complaint Chief Complaint  Patient presents with  . Abdominal Pain  . Diarrhea    HPI Benjamin Cook is a 71 y.o. male.  Level V caveat for urgent need for intervention. Patient presents with lower abdominal pain since Thursday afternoon with his diarrhea. No vomiting. He has a known aortic abdominal aneurysm. No fever, sweats, chills, chest pain, dyspnea.      Past Medical History:  Diagnosis Date  . Adrenal mass ([place])    per pt this is remote (10 years) and benign by biopsy  . AICD (automatic cardioverter/defibrillator) present   . Anxiety   . CAD (coronary artery disease)    a. s/p PCI to LAD 2001 b. myoview 2014 high risk with scar LAD/RCA territory but no ischemia  . Cardiomyopathy, ischemic   . CHF (congestive heart failure) ([name])    class II/III  . COPD (chronic obstructive pulmonary disease) ([name])    smokes cigars but has quit cigarettes  . Flu 03/2015  . HTN (hypertension)   . Hyperlipidemia   . Paroxysmal atrial fibrillation ([name]) 03/2015   chads2vasc score is 4  . PSA (psoriatic arthritis) ([place])    increased  . PVD (peripheral vascular disease) [facility])     Patient Active Problem List   Diagnosis Date Noted  . ICD (implantable cardioverter-defibrillator) lead failure 07/16/2016  . Paroxysmal atrial fibrillation ([place]) 06/26/2015  . Dyspnea 04/10/2015  . Acute respiratory failure ([place]) 03/28/2015  . Essential hypertension 03/28/2015  . Systolic CHF ([place]) 49/70/2637  . Pyrexia   . Shortness of breath   . Influenza A   . VT (ventricular tachycardia) ([place])   . ICD (implantable cardioverter-defibrillator) in place 06/08/2010  . Chronic systolic heart failure ([place]) 04/18/2010  . WEIGHT LOSS 11/01/2008  . TOBACCO ABUSE 09/06/2008  . AAA 09/06/2008  . HLD (hyperlipidemia) 09/05/2008  . COUGH 04/08/2007  . PSA, INCREASED 04/08/2007  . OTHER  ABNORMAL BLOOD CHEMISTRY 03/24/2007  . ADRENAL MASS 10/01/2006  . ANXIETY 10/01/2006  . HYPERTENSION 10/01/2006  . Coronary atherosclerosis 10/01/2006  . Cardiomyopathy, ischemic 10/01/2006  . COPD, severe ([place]) 10/01/2006  . HYPERGLYCEMIA 10/01/2006  . HEMATURIA, HX OF 10/01/2006    Past Surgical History:  Procedure Laterality Date  . cataract surgery    . CORONARY ANGIOPLASTY WITH STENT PLACEMENT  2001   a. PCI to LAD  . IMPLANTABLE CARDIOVERTER DEFIBRILLATOR (ICD) GENERATOR CHANGE N/A 03/18/2014   a. SJM Fortify ST DR ICD implanted by [facility] for primary prevention b. gen change 03/2014   . LEAD REVISION/REPAIR N/A 07/17/2016   Procedure: Lead Revision/Repair;  Surgeon: Evans Lance, MD;  Location: [facility];  Service: Cardiovascular;  Laterality: N/A;  . LEFT HEART CATHETERIZATION WITH CORONARY ANGIOGRAM N/A 05/27/2014   Procedure: LEFT HEART CATHETERIZATION WITH CORONARY ANGIOGRAM;  Surgeon: Sherren Mocha, MD;  Location: [facility] CATH LAB;  Service: Cardiovascular;  Laterality: N/A;       Home Medications    Prior to Admission medications   Medication Sig Start Date End Date Taking? Authorizing Provider  atorvastatin (LIPITOR) 80 MG tablet TAKE 1 TABLET BY MOUTH  DAILY AT 6 PM. Patient taking differently: TAKE 80mg  BY MOUTH  DAILY AT 6 PM. 10/23/16  Yes Allred, Jeneen Rinks, MD  carvedilol (COREG) 25 MG tablet TAKE 1 TABLET BY MOUTH TWO  TIMES DAILY WITH A MEAL Patient taking differently: TAKE  25mg  BY MOUTH TWO  TIMES DAILY WITH A MEAL 10/23/16  Yes Allred, Jeneen Rinks, MD  Fluticasone-Salmeterol (ADVAIR) 100-50 MCG/DOSE AEPB Inhale 1 puff into the lungs 2 (two) times daily. 04/10/15  Yes Renato Shin, MD  furosemide (LASIX) 40 MG tablet TAKE 1 TABLET BY MOUTH  DAILY Patient taking differently: TAKE 40mg  BY MOUTH  DAILY 10/23/16  Yes Allred, Jeneen Rinks, MD  lisinopril (PRINIVIL,ZESTRIL) 20 MG tablet TAKE 1 TABLET BY MOUTH  EVERY DAY Patient taking differently: TAKE 20mg   BY MOUTH  EVERY DAY  10/23/16  Yes Allred, Jeneen Rinks, MD  Multiple Vitamin (MULTIVITAMIN) capsule Take 1 capsule by mouth daily.     Yes [provider]  potassium chloride SA (K-DUR,KLOR-CON) 20 MEQ tablet TAKE 1 TABLET BY MOUTH  DAILY Patient taking differently: TAKE 59meq BY MOUTH once DAILY 10/23/16  Yes Allred, Jeneen Rinks, MD  rivaroxaban (XARELTO) 20 MG TABS tablet Take 1 tablet (20 mg total) by mouth daily with supper. 10/09/16  Yes AllredJeneen Rinks, MD    Family History Family History  Problem Relation Age of Onset  . Cirrhosis Mother        due to ETOH  . Cancer Neg Hx     Social History Social History  Substance Use Topics  . Smoking status: Current Every Day Smoker    Types: Cigars  . Smokeless tobacco: Never Used     Comment: smokes cigars now, no longer smokes cigarettes but previously smoked 2ppd  . Alcohol use No     Allergies   Patient has no known allergies.   Review of Systems Review of Systems  All other systems reviewed and are negative.    Physical Exam Updated Vital Signs BP (!) 157/86   Pulse 95   Temp 98.9 F (37.2 C) (Oral)   Resp 18   SpO2 96%   Physical Exam  Constitutional: He is oriented to person, place, and time.  Alert, slightly dehydrated  HENT:  Head: Normocephalic and atraumatic.  Eyes: Conjunctivae are normal.  Neck: Neck supple.  Cardiovascular: Normal rate and regular rhythm.   Pulmonary/Chest: Effort normal and breath sounds normal.  Abdominal:  Tender suprapubic area  Musculoskeletal: Normal range of motion.  Neurological: He is alert and oriented to person, place, and time.  Skin: Skin is warm and dry.  Psychiatric: He has a normal mood and affect. His behavior is normal.  Nursing note and vitals reviewed.    ED Treatments / Results  Labs (all labs ordered are listed, but only abnormal results are displayed) Labs Reviewed  COMPREHENSIVE METABOLIC PANEL - Abnormal; Notable for the following:       Result Value   Glucose, Bld 112 (*)      Albumin 3.4 (*)    ALT 15 (*)    Total Bilirubin 1.3 (*)    All other components within normal limits  CBC - Abnormal; Notable for the following:    WBC 18.9 (*)    All other components within normal limits  URINALYSIS, ROUTINE W REFLEX MICROSCOPIC - Abnormal; Notable for the following:    APPearance HAZY (*)    Hgb urine dipstick MODERATE (*)    Bacteria, UA FEW (*)    Squamous Epithelial / LPF 0-5 (*)    All other components within normal limits  LIPASE, BLOOD    EKG  EKG Interpretation None       Radiology Ct Abdomen Pelvis W Contrast  Result Date: 11/15/2016 CLINICAL DATA:  Suprapubic pain and diarrhea. EXAM: CT  ABDOMEN AND PELVIS WITH CONTRAST TECHNIQUE: Multidetector CT imaging of the abdomen and pelvis was performed using the standard protocol following bolus administration of intravenous contrast. CONTRAST:  130mL ISOVUE-300 IOPAMIDOL (ISOVUE-300) INJECTION 61% COMPARISON:  CT abdomen pelvis dated April 13, 2003. FINDINGS: Lower chest: No acute abnormality.  Cardiomegaly. Hepatobiliary: No focal liver abnormality is seen. No gallstones, gallbladder wall thickening, or biliary dilatation. Pancreas: Unremarkable. No pancreatic ductal dilatation or surrounding inflammatory changes. Spleen: Normal in size without focal abnormality. Adrenals/Urinary Tract: Unchanged 4.1 cm right adrenal mass, likely an adenoma. The left adrenal gland is unremarkable. Subcentimeter low-density lesions in both kidneys are too small to characterize. No hydronephrosis. No ureteral calculi. The bladder is partially decompressed. Stomach/Bowel: Sigmoid colonic wall thickening with inflammatory changes surrounding an inflamed diverticulum. Small hiatal hernia. The stomach is otherwise within normal limits. No bowel obstruction. Normal appendix. Vascular/Lymphatic: Severe aortic atherosclerosis with interval increase in size of fusiform infrarenal abdominal aortic aneurysm, now measuring up to 5.6 cm in  maximum dimension, previously 3.1 cm. Interval increase in size of ectatic bilateral common iliac arteries. The right common iliac artery measures up to 2.2 cm. Mild aneurysmal dilatation of the proximal right internal iliac artery. Severe atherosclerosis of the mid to distal SMA. No lymphadenopathy. Reproductive: Markedly prostate enlargement with prominent central gland hypertrophy. Other: Trace free fluid in the pelvis. No pneumoperitoneum. Small fat containing bilateral inguinal hernias. Musculoskeletal: No acute or significant osseous findings. Severe degenerative changes of the lower lumbar spine. IMPRESSION: 1. Acute, uncomplicated sigmoid diverticulitis. 2. Interval increase in size of fusiform, infrarenal abdominal aortic aneurysm, measuring up to 5.6 cm. Vascular surgery consultation recommended due to increased risk of rupture for AAA >5.5 cm. This recommendation follows ACR consensus guidelines: White Paper of the ACR Incidental Findings Committee II on Vascular Findings. J Am Coll Radiol 2013; 10:789-794. 3. Ectasia of the bilateral common iliac arteries, increased when compared to prior study. Mild aneurysmal dilatation of the proximal right internal iliac artery. 4.  Aortic atherosclerosis (ICD10-I70.0). 5. Marked prostatomegaly with prominent central gland hypertrophy. 6. Unchanged 4.1 cm right adrenal mass, likely a benign adenoma. Electronically Signed   By: Titus Dubin M.D.   On: 11/15/2016 10:40    Procedures Procedures (including critical care time)  Medications Ordered in ED Medications  piperacillin-tazobactam (ZOSYN) IVPB 3.375 g (not administered)  sodium chloride 0.9 % bolus 1,000 mL (1,000 mLs Intravenous New Bag/Given 11/15/16 1007)  ondansetron (ZOFRAN) injection 4 mg (4 mg Intravenous Given 11/15/16 1007)  morphine 4 MG/ML injection 4 mg (4 mg Intravenous Given 11/15/16 1009)  iopamidol (ISOVUE-300) 61 % injection (100 mLs  Contrast Given 11/15/16 1019)    piperacillin-tazobactam (ZOSYN) IVPB 3.375 g (3.375 g Intravenous New Bag/Given 11/15/16 1300)     Initial Impression / Assessment and Plan / ED Course  I have reviewed the triage vital signs and the nursing notes.  Pertinent labs & imaging results that were available during my care of the patient were reviewed by me and considered in my medical decision making (see chart for details).     CT scan reveals acute sigmoid diverticulitis. There is also been a increase in the size of the infrarenal aortic aneurysm measuring 5.6 cm. IV Zosyn and pain medicine administered. Will admit to general medicine.  Final Clinical Impressions(s) / ED Diagnoses   Final diagnoses:  Sigmoid diverticulitis    New Prescriptions New Prescriptions   No medications on file     Nat Christen, MD 11/15/16 1359

## 2016-11-15 NOTE — ED Notes (Signed)
Ordered a clear liquid diet for patient ordered by admitting Doctor. Patient and family member at bedside notified.

## 2016-11-15 NOTE — ED Triage Notes (Signed)
Pt reports lower abd pain with diarrhea, started last night, some increased frequency of urination, denies dysuria, n/v.

## 2016-11-15 NOTE — Progress Notes (Signed)
Pharmacy Antibiotic Note  Benjamin Cook is a 71 y.o. male admitted on 11/15/2016 with diverticulitis.  Pharmacy has been consulted for zosyn dosing. Afebrile, WBC 18.9, Scr 0.71. CT ABD: diverticulitis.    Plan: Zosyn 3.375g IVX1 (30 minute infusion) then zosyn 3.375g IV q8h (4 hour infusion). Monitor for Scr, c/s, clinical resolution. F/u de-escalation plan/LOT.    Temp (24hrs), Avg:98.9 F (37.2 C), Min:98.9 F (37.2 C), Max:98.9 F (37.2 C)   Recent Labs Lab 11/15/16 0827  WBC 18.9*  CREATININE 0.71    CrCl cannot be calculated (Unknown ideal weight.).    No Known Allergies  Antimicrobials this admission: zosyn 10/12 >>   Thank you for allowing pharmacy to be a part of this patient's care.  Jerrye Noble, PharmD 01/27/8249 12:19 PM

## 2016-11-15 NOTE — Consult Note (Signed)
Hospital Consult    Reason for Consult:  AAA Requesting Physician:  Denton Brick MRN #:  [id number]  History of Present Illness: This is a 71 y.o. male who presented to the ED today with abdominal pain and diarrhea.  He states that during yesterday's storm, he got nervous and his stomach started hurting.  He denies any fever, nausea, or vomiting.   He states that he has had abdominal pain on and off for many years.He denies any back pain, blood in his stool, or melena.  He had a CT scan, which revealed acute uncomplicated sigmoid diverticulitis.  It also revealed that he has a AAA measuring 5.6cm.  He has a known hx of this dating back to 2013 at which time it measured 3.1cm.  He did have an u/s that was ordered by cardiology a year ago, which measured 4.2cm.   He smokes 6 small cigars a day.  He used to smoke 2-3  packs of cigarettes per day but quit ~ 15 years ago and started smoking cigars.  He has a hx of MI ~ 15 years ago also.  The pt is on a statin for cholesterol management.  He is on a beta blocker and ACEI for blood pressure management.  He is on Xarelto for PAF.   He does have a defibrillator on the left chest.  He had an echocardiogram 2/17, which revealed an EF of 30-35%  Past Medical History:  Diagnosis Date  . Adrenal mass ([place])    per pt this is remote (10 years) and benign by biopsy  . AICD (automatic cardioverter/defibrillator) present   . Anxiety   . CAD (coronary artery disease)    a. s/p PCI to LAD 2001 b. myoview 2014 high risk with scar LAD/RCA territory but no ischemia  . Cardiomyopathy, ischemic   . CHF (congestive heart failure) ([name])    class II/III  . COPD (chronic obstructive pulmonary disease) ([name])    smokes cigars but has quit cigarettes  . Flu 03/2015  . HTN (hypertension)   . Hyperlipidemia   . Paroxysmal atrial fibrillation ([name]) 03/2015   chads2vasc score is 4  . PSA (psoriatic arthritis) ([place])    increased  . PVD (peripheral vascular disease) ([place])      Past Surgical History:  Procedure Laterality Date  . cataract surgery    . CORONARY ANGIOPLASTY WITH STENT PLACEMENT  2001   a. PCI to LAD  . IMPLANTABLE CARDIOVERTER DEFIBRILLATOR (ICD) GENERATOR CHANGE N/A 03/18/2014   a. SJM Fortify ST DR ICD implanted by [facility] for primary prevention b. gen change 03/2014   . LEAD REVISION/REPAIR N/A 07/17/2016   Procedure: Lead Revision/Repair;  Surgeon: Evans Lance, MD;  Location: [facility];  Service: Cardiovascular;  Laterality: N/A;  . LEFT HEART CATHETERIZATION WITH CORONARY ANGIOGRAM N/A 05/27/2014   Procedure: LEFT HEART CATHETERIZATION WITH CORONARY ANGIOGRAM;  Surgeon: Sherren Mocha, MD;  Location: [facility] CATH LAB;  Service: Cardiovascular;  Laterality: N/A;    No Known Allergies  Prior to Admission medications   Medication Sig Start Date End Date Taking? Authorizing Provider  atorvastatin (LIPITOR) 80 MG tablet TAKE 1 TABLET BY MOUTH  DAILY AT 6 PM. Patient taking differently: TAKE 80mg  BY MOUTH  DAILY AT 6 PM. 10/23/16  Yes Allred, Jeneen Rinks, MD  carvedilol (COREG) 25 MG tablet TAKE 1 TABLET BY MOUTH TWO  TIMES DAILY WITH A MEAL Patient taking differently: TAKE 25mg  BY MOUTH TWO  TIMES DAILY WITH A MEAL 10/23/16  Yes Allred, Jeneen Rinks, MD  Fluticasone-Salmeterol (ADVAIR) 100-50 MCG/DOSE AEPB Inhale 1 puff into the lungs 2 (two) times daily. 04/10/15  Yes Renato Shin, MD  furosemide (LASIX) 40 MG tablet TAKE 1 TABLET BY MOUTH  DAILY Patient taking differently: TAKE 40mg  BY MOUTH  DAILY 10/23/16  Yes Allred, Jeneen Rinks, MD  lisinopril (PRINIVIL,ZESTRIL) 20 MG tablet TAKE 1 TABLET BY MOUTH  EVERY DAY Patient taking differently: TAKE 20mg   BY MOUTH  EVERY DAY 10/23/16  Yes Allred, Jeneen Rinks, MD  Multiple Vitamin (MULTIVITAMIN) capsule Take 1 capsule by mouth daily.     Yes [provider]  potassium chloride SA (K-DUR,KLOR-CON) 20 MEQ tablet TAKE 1 TABLET BY MOUTH  DAILY Patient taking differently: TAKE 22meq BY MOUTH once DAILY 10/23/16  Yes  Allred, Jeneen Rinks, MD  rivaroxaban (XARELTO) 20 MG TABS tablet Take 1 tablet (20 mg total) by mouth daily with supper. 10/09/16  Yes Thompson Grayer, MD    Social History   Social History  . Marital status: Married    Spouse name: N/A  . Number of children: N/A  . Years of education: N/A   Occupational History  . Lawn care    Social History Main Topics  . Smoking status: Current Every Day Smoker    Types: Cigars  . Smokeless tobacco: Never Used     Comment: smokes cigars now, no longer smokes cigarettes but previously smoked 2ppd  . Alcohol use No  . Drug use: No  . Sexual activity: Not Currently    Partners: Female    Birth control/ protection: None   Other Topics Concern  . Not on file   Social History Narrative  . No narrative on file     Family History  Problem Relation Age of Onset  . Cirrhosis Mother        due to ETOH  . Cancer Neg Hx     ROS: [x]  Positive   [ ]  Negative   [ ]  All sytems reviewed and are negative  Cardiac: []  chest pain/pressure []  palpitations []  SOB lying flat [x]  DOE [x]  AICD   Vascular: []  pain in legs while walking []  pain in legs at rest []  pain in legs at night []  non-healing ulcers []  hx of DVT []  swelling in legs  Pulmonary: []  productive cough []  asthma/wheezing []  home O2 [x]  hx COPD  Neurologic: []  weakness in []  arms []  legs []  numbness in []  arms []  legs []  hx of CVA []  mini stroke [] difficulty speaking or slurred speech []  temporary loss of vision in one eye []  dizziness  Hematologic: []  hx of cancer []  bleeding problems []  problems with blood clotting easily  Endocrine:   []  diabetes []  thyroid disease  GI []  vomiting blood []  blood in stool [x]  abdominal pain [x]  diarrhea  GU: []  CKD/renal failure []  HD--[]  M/W/F or []  T/T/S []  burning with urination []  blood in urine  Psychiatric: []  anxiety []  depression  Musculoskeletal: []  arthritis []  joint pain  Integumentary: []  rashes []   ulcers  Constitutional: []  fever []  chills   Physical Examination  Vitals:   11/15/16 1445 11/15/16 1500  BP: 137/71 (!) 149/71  Pulse: 76 79  Resp: 18 18  Temp:    SpO2: 95% 95%   There is no height or weight on file to calculate BMI.  General:  WDWN in NAD Gait: Not observed HENT: WNL, normocephalic Pulmonary: normal non-labored breathing, without Rales, rhonchi,  wheezing Cardiac: regular, without  Murmurs, rubs or gallops; without  carotid bruits Abdomen:  soft, tender to palpation; no masses Skin: without rashes Vascular Exam/Pulses:  Right Left  Radial 2+ (normal) 2+ (normal)  Ulnar Unable to palpate  Unable to palpate   Femoral 3+ (hyperdynamic) 3+ (hyperdynamic)  Popliteal Unable to palpate  2-3+ prominent  DP 1+ (weak) Unable to palpate   PT Unable to palpate  2+ (normal)   Extremities: without ischemic changes, without Gangrene , without cellulitis; without open wounds;  Musculoskeletal: no muscle wasting or atrophy  Neurologic: A&O X 3;  No focal weakness or paresthesias are detected; speech is fluent/normal Psychiatric:  The pt has Normal affect. Lymph:  No inguinal lymphadenopathy  CBC    Component Value Date/Time   WBC 18.9 (H) 11/15/2016 0827   RBC 4.68 11/15/2016 0827   HGB 14.0 11/15/2016 0827   HCT 41.9 11/15/2016 0827   PLT 163 11/15/2016 0827   MCV 89.5 11/15/2016 0827   MCH 29.9 11/15/2016 0827   MCHC 33.4 11/15/2016 0827   RDW 15.3 11/15/2016 0827   LYMPHSABS 1,944 09/15/2015 0959   MONOABS 720 09/15/2015 0959   EOSABS 288 09/15/2015 0959   BASOSABS 0 09/15/2015 0959    BMET    Component Value Date/Time   NA 136 11/15/2016 0827   K 4.0 11/15/2016 0827   CL 104 11/15/2016 0827   CO2 24 11/15/2016 0827   GLUCOSE 112 (H) 11/15/2016 0827   BUN 7 11/15/2016 0827   CREATININE 0.71 11/15/2016 0827   CREATININE 0.88 09/15/2015 0959   CALCIUM 9.3 11/15/2016 0827   GFRNONAA >60 11/15/2016 0827   GFRAA >60 11/15/2016 0827     COAGS: Lab Results  Component Value Date   INR 1.1 (H) 05/25/2014   INR 1.0 05/18/2010   INR 1.0 06/03/2007     Non-Invasive Vascular Imaging:   CT abdomen/pelvis with contrast 11/15/16: 1. Acute, uncomplicated sigmoid diverticulitis. 2. Interval increase in size of fusiform, infrarenal abdominal aortic aneurysm, measuring up to 5.6 cm. Vascular surgery consultation recommended due to increased risk of rupture for AAA >5.5 cm. This recommendation follows ACR consensus guidelines: White Paper of the ACR Incidental Findings Committee II on Vascular Findings. J Am Coll Radiol 2013; 10:789-794. 3. Ectasia of the bilateral common iliac arteries, increased when compared to prior study. Mild aneurysmal dilatation of the proximal right internal iliac artery. 4.  Aortic atherosclerosis (ICD10-I70.0). 5. Marked prostatomegaly with prominent central gland hypertrophy. 6. Unchanged 4.1 cm right adrenal mass, likely a benign adenoma  Ultrasound of abdomen 11/24/15 Mildly dilated aorta at the level of the celiac axis; remains stable. Stable infrarenal fusiform AAA measuring 4.1cm x 4.2cm with moderate thrombus. Dilated common iliac arteries bilateral; slight increase in left CIA diameter.  Statin:  Yes.   Beta Blocker:  Yes.   Aspirin:  No. ACEI:  Yes.   ARB:  No. CCB use:  No Other antiplatelets/anticoagulants:  Yes.   Xarelto   ASSESSMENT/PLAN: This is a 71 y.o. male who presents to the ED with abdominal pain and diarrhea.   -pt found to have acute uncomplicated sigmoid diverticulitis.  He is being admitted for IV abx. -on CT scan, he was found to have an increase in the size of his infra-renal AAA.  A year ago, it measured 4.2cm and today on CT scan, it measures 5.6cm, which is a significant increase in a year.  He will definitely need to have this repaired.  Dr. Donzetta Matters feels he can repair this with a stent graft, most likely as an  outpatient.  He will need to recover from his  current infection.   His Xarelto will need to be held when a date is decided about repair. -continue statin -will need cardiac clearance from cardiology. -signs/sx discussed with pt about rupture AAA.  He is to call 911 and come straight to [place] if he develops these sx.      Leontine Locket, PA-C [facility] [phone]   I have interviewed and examined patient with PA and agree with assessment and plan above. Will schedule when over diverticulitis. Also will evaluate for popliteal aneurysms while here given large pulsation on left.   Aneita Kiger C. Donzetta Matters, MD [facility] [place] Office: [phone] Pager: [phone]

## 2016-11-16 ENCOUNTER — Inpatient Hospital Stay (HOSPITAL_COMMUNITY): Payer: Medicare Other

## 2016-11-16 DIAGNOSIS — I744 Embolism and thrombosis of arteries of extremities, unspecified: Secondary | ICD-10-CM

## 2016-11-16 DIAGNOSIS — K5732 Diverticulitis of large intestine without perforation or abscess without bleeding: Principal | ICD-10-CM

## 2016-11-16 DIAGNOSIS — I714 Abdominal aortic aneurysm, without rupture: Secondary | ICD-10-CM | POA: Diagnosis not present

## 2016-11-16 LAB — BASIC METABOLIC PANEL
Anion gap: 10 (ref 5–15)
BUN: 7 mg/dL (ref 6–20)
CHLORIDE: 102 mmol/L (ref 101–111)
CO2: 24 mmol/L (ref 22–32)
Calcium: 8.5 mg/dL — ABNORMAL LOW (ref 8.9–10.3)
Creatinine, Ser: 0.83 mg/dL (ref 0.61–1.24)
GFR calc non Af Amer: >60 mL/min (ref >60)
Glucose, Bld: 100 mg/dL — ABNORMAL HIGH (ref 65–99)
POTASSIUM: 3.4 mmol/L — AB (ref 3.5–5.1)
SODIUM: 136 mmol/L (ref 135–145)

## 2016-11-16 LAB — CBC
HEMATOCRIT: 37.7 % — AB (ref 39.0–52.0)
HEMOGLOBIN: 12.3 g/dL — AB (ref 13.0–17.0)
MCH: 29.4 pg (ref 26.0–34.0)
MCHC: 32.6 g/dL (ref 30.0–36.0)
MCV: 90 fL (ref 78.0–100.0)
Platelets: 169 10*3/uL (ref 150–400)
RBC: 4.19 MIL/uL — AB (ref 4.22–5.81)
RDW: 15.5 % (ref 11.5–15.5)
WBC: 15.9 10*3/uL — AB (ref 4.0–10.5)

## 2016-11-16 LAB — VAS US LOWER EXTREMITY ARTERIAL DUPLEX
Left popliteal dist sys PSV: -27 cm/s
Left popliteal prox sys PSV: -28 cm/s
RPOPDPSV: -13 cm/s
RPOPPPSV: -11 cm/s

## 2016-11-16 NOTE — Progress Notes (Signed)
  Progress Note    11/16/2016 1:50 PM * No surgery found *  Subjective:  Still having abdominal pain, nausea  Vitals:   11/16/16 0544 11/16/16 0904  BP: (!) 110/54   Pulse: 68   Resp: 20   Temp: 99.1 F (37.3 C)   SpO2: 94% 95%    Physical Exam: aaox3 Abdomen remains ttp  CBC    Component Value Date/Time   WBC 15.9 (H) 11/16/2016 0543   RBC 4.19 (L) 11/16/2016 0543   HGB 12.3 (L) 11/16/2016 0543   HCT 37.7 (L) 11/16/2016 0543   PLT 169 11/16/2016 0543   MCV 90.0 11/16/2016 0543   MCH 29.4 11/16/2016 0543   MCHC 32.6 11/16/2016 0543   RDW 15.5 11/16/2016 0543   LYMPHSABS 1,944 09/15/2015 0959   MONOABS 720 09/15/2015 0959   EOSABS 288 09/15/2015 0959   BASOSABS 0 09/15/2015 0959    BMET    Component Value Date/Time   NA 136 11/16/2016 0543   K 3.4 (L) 11/16/2016 0543   CL 102 11/16/2016 0543   CO2 24 11/16/2016 0543   GLUCOSE 100 (H) 11/16/2016 0543   BUN 7 11/16/2016 0543   CREATININE 0.83 11/16/2016 0543   CREATININE 0.88 09/15/2015 0959   CALCIUM 8.5 (L) 11/16/2016 0543   GFRNONAA >60 11/16/2016 0543   GFRAA >60 11/16/2016 0543    INR    Component Value Date/Time   INR 1.1 (H) 05/25/2014 1606     Intake/Output Summary (Last 24 hours) at 11/16/16 1350 Last data filed at 11/16/16 0559  Gross per 24 hour  Intake           690.83 ml  Output                1 ml  Net           689.83 ml     Assessment:  71 y.o. male is here with diverticulitis, AAA now > 5.5 cm and should be candidate for EVAR. No popliteal aneurysms by duplex today  Plan: Management of diverticulitis per primary Will arrange outpatient f/u for planning AAA repair when diverticulitis has resolved   Brandon C. Donzetta Matters, MD [facility] [place] Office: [phone] Pager: [phone]  11/16/2016 1:50 PM

## 2016-11-16 NOTE — Progress Notes (Signed)
VASCULAR LAB PRELIMINARY  PRELIMINARY  PRELIMINARY  PRELIMINARY  Lower extremity arterial duplex to rule out popliteal artery aneurysms completed.    Preliminary report:  There is no obvious evidence of popliteal artery aneurysm, bilaterally.  Significant calcification noted, bilaterally with dampened monophasic waveforms noted in the right popliteal artery.   Terrel Manalo, RVT 11/16/2016, 8:40 AM

## 2016-11-16 NOTE — Progress Notes (Signed)
[facility] Progress Note  Patient: Benjamin Cook QAS:[id number]   PCP: Renato Shin, MD DOB: [birth year]   DOA: 11/15/2016   DOS: 11/16/2016   Date of Service: the patient was seen and examined on 11/16/2016  Subjective: Continues to have abdominal pain. Diarrhea frequency getting better. No nausea no vomiting. Concern about the adrenal mass seen on the CT scan before.  Brief hospital course: Pt. with PMH of chronic systolic CHF, AAA, active smoker, COPD; admitted on 11/15/2016, presented with complaint of diarrhea, was found to have sigmoid diverticulitis. Currently further plan is continue IV Zosyn.  Assessment and Plan: 1. Sigmoid diverticulitis. Continue clear liquid diet, IV fluids, IV Zosyn. Next on when necessary pain medication. Likely advance to full liquid tomorrow. Follow up on culture. No Imodium.  2. AAA. Size is increased significantly. Vascular surgery was consulted. Patient will have outpatient endovascular procedure scheduled before discharge.  3. Adrenal mass. Patient had an adrenal mass 4.1 cm size back in 2005, unchanged on CT scan yesterday.  4. Chronic systolic CHF. History of CAD as well as AICD placement. Currently no volume overload. Patient receiving gentle IV hydration. Continue Coreg Lipitor and lisinopril. Lasix is currently on hold.  5. Active smoker. COPD. Continue inhalers. Not interested in smoking cessation for now.  Diet: cleares DVT Prophylaxis: subcutaneous Heparin  Advance goals of care discussion: full code  Family Communication: family was present at bedside, at the time of interview. The pt provided permission to discuss medical plan with the family. Opportunity was given to ask question and all questions were answered satisfactorily.   Disposition:  Discharge to home.  Consultants: vascular surgery  Procedures: noen  Antibiotics: Anti-infectives    Start     Dose/Rate Route Frequency Ordered Stop   11/15/16  2000  piperacillin-tazobactam (ZOSYN) IVPB 3.375 g     3.375 g 12.5 mL/hr over 240 Minutes Intravenous Every 8 hours 11/15/16 1246     11/15/16 1245  piperacillin-tazobactam (ZOSYN) IVPB 3.375 g     3.375 g 100 mL/hr over 30 Minutes Intravenous  Once 11/15/16 1244 11/15/16 1340       Objective: Physical Exam: Vitals:   11/15/16 2145 11/16/16 0544 11/16/16 0904 11/16/16 1500  BP:  (!) 110/54  (!) 155/74  Pulse:  68  70  Resp:  20  (!) 24  Temp:  99.1 F (37.3 C)  98.5 F (36.9 C)  TempSrc:  Oral  Oral  SpO2: 94% 94% 95% 98%  Weight:      Height:        Intake/Output Summary (Last 24 hours) at 11/16/16 1925 Last data filed at 11/16/16 1500  Gross per 24 hour  Intake          1191.66 ml  Output                1 ml  Net          1190.66 ml   Filed Weights   11/15/16 1904  Weight: 68 kg (150 lb)   General: Alert, Awake and Oriented to Time, Place and Person. Appear in mild distress, affect appropriate Eyes: PERRL, Conjunctiva normal ENT: Oral Mucosa clear moist. Neck: no JVD, no Abnormal Mass Or lumps Cardiovascular: S1 and S2 Present, no Murmur, Peripheral Pulses Present Respiratory: normal respiratory effort, Bilateral Air entry equal and Decreased, no use of accessory muscle, Clear to Auscultation, no Crackles, no wheezes Abdomen: Bowel Sound present, Soft and mild tenderness, no hernia Skin: no redness, no Rash, no  induration Extremities: no Pedal edema, no calf tenderness Neurologic: Grossly no focal neuro deficit. Bilaterally Equal motor strength  Data Reviewed: CBC:  Recent Labs Lab 11/15/16 0827 11/16/16 0543  WBC 18.9* 15.9*  HGB 14.0 12.3*  HCT 41.9 37.7*  MCV 89.5 90.0  PLT 163 546   Basic Metabolic Panel:  Recent Labs Lab 11/15/16 0827 11/16/16 0543  NA 136 136  K 4.0 3.4*  CL 104 102  CO2 24 24  GLUCOSE 112* 100*  BUN 7 7  CREATININE 0.71 0.83  CALCIUM 9.3 8.5*    Liver Function Tests:  Recent Labs Lab 11/15/16 0827  AST 19    ALT 15*  ALKPHOS 70  BILITOT 1.3*  PROT 7.4  ALBUMIN 3.4*    Recent Labs Lab 11/15/16 0827  LIPASE 17   No results for input(s): AMMONIA in the last 168 hours. Coagulation Profile: No results for input(s): INR, PROTIME in the last 168 hours. Cardiac Enzymes: No results for input(s): CKTOTAL, CKMB, CKMBINDEX, TROPONINI in the last 168 hours. BNP (last 3 results) No results for input(s): PROBNP in the last 8760 hours. CBG: No results for input(s): GLUCAP in the last 168 hours. Studies: No results found.  Scheduled Meds: . atorvastatin  80 mg Oral q1800  . mometasone-formoterol  2 puff Inhalation BID  . multivitamin with minerals  1 tablet Oral Daily  . potassium chloride SA  20 mEq Oral Daily  . rivaroxaban  20 mg Oral Q supper  . sodium chloride flush  3 mL Intravenous Q12H   Continuous Infusions: . sodium chloride    . sodium chloride 50 mL/hr at 11/15/16 1810  . piperacillin-tazobactam (ZOSYN)  IV Stopped (11/16/16 1516)   PRN Meds: sodium chloride, acetaminophen **OR** acetaminophen, albuterol, hydrALAZINE, morphine injection, ondansetron **OR** ondansetron (ZOFRAN) IV, oxyCODONE, sodium chloride flush, traZODone  Time spent: 35 minutes  Author: Berle Mull, MD [facility] Pager: [phone] 11/16/2016 7:25 PM  If 7PM-7AM, please contact night-coverage at [email or web address], password [facility]

## 2016-11-17 LAB — CBC WITH DIFFERENTIAL/PLATELET
BASOS ABS: 0 10*3/uL (ref 0.0–0.1)
Basophils Relative: 0 %
Eosinophils Absolute: 0.3 10*3/uL (ref 0.0–0.7)
Eosinophils Relative: 2 %
HEMATOCRIT: 36.5 % — AB (ref 39.0–52.0)
HEMOGLOBIN: 12.2 g/dL — AB (ref 13.0–17.0)
LYMPHS PCT: 10 %
Lymphs Abs: 1.3 10*3/uL (ref 0.7–4.0)
MCH: 29.7 pg (ref 26.0–34.0)
MCHC: 33.4 g/dL (ref 30.0–36.0)
MCV: 88.8 fL (ref 78.0–100.0)
MONO ABS: 1.9 10*3/uL — AB (ref 0.1–1.0)
Monocytes Relative: 16 %
NEUTROS ABS: 9 10*3/uL — AB (ref 1.7–7.7)
Neutrophils Relative %: 72 %
Platelets: 170 10*3/uL (ref 150–400)
RBC: 4.11 MIL/uL — AB (ref 4.22–5.81)
RDW: 14.5 % (ref 11.5–15.5)
WBC: 12.5 10*3/uL — AB (ref 4.0–10.5)

## 2016-11-17 LAB — COMPREHENSIVE METABOLIC PANEL
ALK PHOS: 55 U/L (ref 38–126)
ALT: 14 U/L — AB (ref 17–63)
ANION GAP: 8 (ref 5–15)
AST: 20 U/L (ref 15–41)
Albumin: 2.9 g/dL — ABNORMAL LOW (ref 3.5–5.0)
BILIRUBIN TOTAL: 1 mg/dL (ref 0.3–1.2)
BUN: <5 mg/dL — ABNORMAL LOW (ref 6–20)
CALCIUM: 8.5 mg/dL — AB (ref 8.9–10.3)
CO2: 25 mmol/L (ref 22–32)
CREATININE: 0.68 mg/dL (ref 0.61–1.24)
Chloride: 103 mmol/L (ref 101–111)
GFR calc non Af Amer: >60 mL/min (ref >60)
Glucose, Bld: 104 mg/dL — ABNORMAL HIGH (ref 65–99)
Potassium: 3.2 mmol/L — ABNORMAL LOW (ref 3.5–5.1)
Sodium: 136 mmol/L (ref 135–145)
TOTAL PROTEIN: 6.4 g/dL — AB (ref 6.5–8.1)

## 2016-11-17 LAB — MAGNESIUM: Magnesium: 1.7 mg/dL (ref 1.7–2.4)

## 2016-11-17 LAB — LACTIC ACID, PLASMA: LACTIC ACID, VENOUS: 0.7 mmol/L (ref 0.5–1.9)

## 2016-11-17 MED ORDER — POTASSIUM CHLORIDE CRYS ER 20 MEQ PO TBCR
40.0000 meq | EXTENDED_RELEASE_TABLET | Freq: Once | ORAL | Status: AC
  Administered 2016-11-17: 40 meq via ORAL
  Filled 2016-11-17: qty 2

## 2016-11-17 MED ORDER — AMOXICILLIN-POT CLAVULANATE 500-125 MG PO TABS
1.0000 | ORAL_TABLET | Freq: Three times a day (TID) | ORAL | Status: DC
  Administered 2016-11-17 – 2016-11-18 (×5): 500 mg via ORAL
  Filled 2016-11-17 (×7): qty 1

## 2016-11-17 NOTE — Progress Notes (Signed)
[facility] Progress Note  Patient: Benjamin Cook KWI:[id number]   PCP: Renato Shin, MD DOB: 09/18/1945   DOA: 11/15/2016   DOS: 11/17/2016   Date of Service: the patient was seen and examined on 11/17/2016  Subjective: Have been able to tolerate clear liquids. Still has tenesmus. No blood in the stool.  Brief hospital course: Pt. with PMH of chronic systolic CHF, AAA, active smoker, COPD; admitted on 11/15/2016, presented with complaint of diarrhea, was found to have sigmoid diverticulitis. Currently further plan is continue IV Zosyn.  Assessment and Plan: 1. Sigmoid diverticulitis. Advance to full liquid diet, changed to Augmentin. Monitor overnight. Avoiding aggressive IV hydration given his history of systolic CHF. Explained that tenesmus is expected. No Imodium.  2. AAA. Size is increased significantly. Vascular surgery was consulted. Patient will have outpatient endovascular procedure scheduled before discharge.  3. Adrenal mass. Patient had an adrenal mass 4.1 cm size back in 2005, unchanged on CT scan yesterday.  4. Chronic systolic CHF. History of CAD as well as AICD placement. Currently no volume overload. Patient received gentle IV hydration, currently holding it and being judicial about it since the patient has poor EF. Continue Coreg Lipitor and lisinopril. Lasix is currently on hold.  5. Active smoker. COPD. Continue inhalers. Not interested in smoking cessation for now.  Diet: cleares DVT Prophylaxis: subcutaneous Heparin  Advance goals of care discussion: full code  Family Communication: family was present at bedside, at the time of interview. The pt provided permission to discuss medical plan with the family. Opportunity was given to ask question and all questions were answered satisfactorily.   Disposition:  Discharge to home.  Consultants: vascular surgery  Procedures: noen  Antibiotics: Anti-infectives    Start     Dose/Rate Route  Frequency Ordered Stop   11/17/16 0830  amoxicillin-clavulanate (AUGMENTIN) 500-125 MG per tablet 500 mg     1 tablet Oral Every 8 hours 11/17/16 0742     11/15/16 2000  piperacillin-tazobactam (ZOSYN) IVPB 3.375 g  Status:  Discontinued     3.375 g 12.5 mL/hr over 240 Minutes Intravenous Every 8 hours 11/15/16 1246 11/17/16 0742   11/15/16 1245  piperacillin-tazobactam (ZOSYN) IVPB 3.375 g     3.375 g 100 mL/hr over 30 Minutes Intravenous  Once 11/15/16 1244 11/15/16 1340       Objective: Physical Exam: Vitals:   11/16/16 1500 11/16/16 2133 11/17/16 0552 11/17/16 0834  BP: (!) 155/74 (!) 153/61 (!) 154/69   Pulse: 70 67 64   Resp: (!) 24 18 18    Temp: 98.5 F (36.9 C) 98.7 F (37.1 C) 99.1 F (37.3 C)   TempSrc: Oral Oral    SpO2: 98% 95% 95% 97%  Weight:      Height:       No intake or output data in the 24 hours ending 11/17/16 1611 Filed Weights   11/15/16 1904  Weight: 68 kg (150 lb)   General: Alert, Awake and Oriented to Time, Place and Person. Appear in mild distress, affect appropriate Eyes: PERRL, Conjunctiva normal ENT: Oral Mucosa clear moist. Neck: no JVD, no Abnormal Mass Or lumps Cardiovascular: S1 and S2 Present, no Murmur, Peripheral Pulses Present Respiratory: normal respiratory effort, Bilateral Air entry equal and Decreased, no use of accessory muscle, Clear to Auscultation, no Crackles, no wheezes Abdomen: Bowel Sound present, Soft and mild tenderness, no hernia Skin: no redness, no Rash, no induration Extremities: no Pedal edema, no calf tenderness Neurologic: Grossly no focal neuro deficit.  Bilaterally Equal motor strength  Data Reviewed: CBC:  Recent Labs Lab 11/15/16 0827 11/16/16 0543 11/17/16 0538  WBC 18.9* 15.9* 12.5*  NEUTROABS  --   --  9.0*  HGB 14.0 12.3* 12.2*  HCT 41.9 37.7* 36.5*  MCV 89.5 90.0 88.8  PLT 163 169 119   Basic Metabolic Panel:  Recent Labs Lab 11/15/16 0827 11/16/16 0543 11/17/16 0538  NA 136 136 136    K 4.0 3.4* 3.2*  CL 104 102 103  CO2 24 24 25   GLUCOSE 112* 100* 104*  BUN 7 7 <5*  CREATININE 0.71 0.83 0.68  CALCIUM 9.3 8.5* 8.5*  MG  --   --  1.7    Liver Function Tests:  Recent Labs Lab 11/15/16 0827 11/17/16 0538  AST 19 20  ALT 15* 14*  ALKPHOS 70 55  BILITOT 1.3* 1.0  PROT 7.4 6.4*  ALBUMIN 3.4* 2.9*    Recent Labs Lab 11/15/16 0827  LIPASE 17   No results for input(s): AMMONIA in the last 168 hours. Coagulation Profile: No results for input(s): INR, PROTIME in the last 168 hours. Cardiac Enzymes: No results for input(s): CKTOTAL, CKMB, CKMBINDEX, TROPONINI in the last 168 hours. BNP (last 3 results) No results for input(s): PROBNP in the last 8760 hours. CBG: No results for input(s): GLUCAP in the last 168 hours. Studies: No results found.  Scheduled Meds: . amoxicillin-clavulanate  1 tablet Oral Q8H  . atorvastatin  80 mg Oral q1800  . mometasone-formoterol  2 puff Inhalation BID  . multivitamin with minerals  1 tablet Oral Daily  . potassium chloride SA  20 mEq Oral Daily  . rivaroxaban  20 mg Oral Q supper  . sodium chloride flush  3 mL Intravenous Q12H   Continuous Infusions: . sodium chloride    . sodium chloride 50 mL/hr at 11/17/16 1100   PRN Meds: sodium chloride, acetaminophen **OR** acetaminophen, albuterol, hydrALAZINE, morphine injection, ondansetron **OR** ondansetron (ZOFRAN) IV, oxyCODONE, sodium chloride flush, traZODone  Time spent: 35 minutes  Author: Berle Mull, MD [facility] Pager: [phone] 11/17/2016 4:11 PM  If 7PM-7AM, please contact night-coverage at [email or web address], password [facility]

## 2016-11-17 NOTE — Progress Notes (Signed)
   Feeling some better today Will have f/u in 2 weeks from discharge to plan EVAR when infection has cleared.   Shahla Betsill C. Donzetta Matters, MD [facility] Office: [phone] Pager: [phone]

## 2016-11-18 ENCOUNTER — Telehealth: Payer: Self-pay | Admitting: Vascular Surgery

## 2016-11-18 DIAGNOSIS — I714 Abdominal aortic aneurysm, without rupture: Secondary | ICD-10-CM | POA: Diagnosis not present

## 2016-11-18 LAB — BASIC METABOLIC PANEL
Anion gap: 11 (ref 5–15)
CO2: 23 mmol/L (ref 22–32)
Calcium: 8.7 mg/dL — ABNORMAL LOW (ref 8.9–10.3)
Chloride: 104 mmol/L (ref 101–111)
Creatinine, Ser: 0.7 mg/dL (ref 0.61–1.24)
Glucose, Bld: 86 mg/dL (ref 65–99)
POTASSIUM: 3.2 mmol/L — AB (ref 3.5–5.1)
SODIUM: 138 mmol/L (ref 135–145)

## 2016-11-18 LAB — CBC
HCT: 38.4 % — ABNORMAL LOW (ref 39.0–52.0)
Hemoglobin: 12.7 g/dL — ABNORMAL LOW (ref 13.0–17.0)
MCH: 29.2 pg (ref 26.0–34.0)
MCHC: 33.1 g/dL (ref 30.0–36.0)
MCV: 88.3 fL (ref 78.0–100.0)
PLATELETS: 209 10*3/uL (ref 150–400)
RBC: 4.35 MIL/uL (ref 4.22–5.81)
RDW: 14.8 % (ref 11.5–15.5)
WBC: 11 10*3/uL — AB (ref 4.0–10.5)

## 2016-11-18 LAB — MAGNESIUM: MAGNESIUM: 1.7 mg/dL (ref 1.7–2.4)

## 2016-11-18 MED ORDER — POTASSIUM CHLORIDE CRYS ER 20 MEQ PO TBCR: 40.0000 meq | EXTENDED_RELEASE_TABLET | Freq: Every day | ORAL | 0 refills | Status: DC

## 2016-11-18 MED ORDER — POTASSIUM CHLORIDE CRYS ER 20 MEQ PO TBCR
40.0000 meq | EXTENDED_RELEASE_TABLET | Freq: Every day | ORAL | Status: DC
  Administered 2016-11-18: 40 meq via ORAL
  Filled 2016-11-18: qty 2

## 2016-11-18 MED ORDER — FUROSEMIDE 40 MG PO TABS: 20.0000 mg | ORAL_TABLET | Freq: Every day | ORAL | 0 refills | Status: DC

## 2016-11-18 MED ORDER — AMOXICILLIN-POT CLAVULANATE 500-125 MG PO TABS: 1.0000 | ORAL_TABLET | Freq: Three times a day (TID) | ORAL | 0 refills | Status: AC

## 2016-11-18 NOTE — Discharge Instructions (Signed)
Follow these instructions at home:  Follow your health care providers instructions carefully.  Follow a full liquid diet or other diet as directed by your health care provider. After your symptoms improve, your health care provider may tell you to change your diet. He or she may recommend you eat a high-fiber diet. Fruits and vegetables are good sources of fiber. Fiber makes it easier to pass stool.  Take fiber supplements or probiotics as directed by your health care provider.  Only take medicines as directed by your health care provider.  Keep all your follow-up appointments. Contact a health care provider if:  Your pain does not improve.  You have a hard time eating food.  Your bowel movements do not return to normal. Get help right away if:  Your pain becomes worse.  Your symptoms do not get better.  Your symptoms suddenly get worse.  You have a fever.  You have repeated vomiting.  You have bloody or black, tarry stools. This information is not intended to replace advice given to you by your health care provider. Make sure you discuss any questions you have with your health care provider. Document Released: 10/31/2004 Document Revised: 06/29/2015 Document Reviewed: 12/16/2012 Elsevier Interactive Patient Education  2017 [employer] Diverticulitis Diverticulitis is when small pockets in your large intestine (colon) get infected or swollen. This causes stomach pain and watery poop (diarrhea). These pouches are called diverticula. They form in people who have a condition called diverticulosis. Follow these instructions at home: Medicines  Take over-the-counter and prescription medicines only as told by your doctor. These include: ? Antibiotics. ? Pain medicines. ? Fiber pills. ? Probiotics. ? Stool softeners.  Do not drive or use heavy machinery while taking prescription pain medicine.  If you were prescribed an antibiotic, take it as told. Do not stop taking it  even if you feel better. General instructions  Follow a diet as told by your doctor.  When you feel better, your doctor may tell you to change your diet. You may need to eat a lot of fiber. Fiber makes it easier to poop (have bowel movements). Healthy foods with fiber include: ? Berries. ? Beans. ? Lentils. ? Green vegetables.  Exercise 3 or more times a week. Aim for 30 minutes each time. Exercise enough to sweat and make your heart beat faster.  Keep all follow-up visits as told. This is important. You may need to have an exam of the large intestine. This is called a colonoscopy. Contact a doctor if:  Your pain does not get better.  You have a hard time eating or drinking.  You are not pooping like normal. Get help right away if:  Your pain gets worse.  Your problems do not get better.  Your problems get worse very fast.  You have a fever.  You throw up (vomit) more than one time.  You have poop that is: ? Bloody. ? Black. ? Tarry. Summary  Diverticulitis is when small pockets in your large intestine (colon) get infected or swollen.  Take medicines only as told by your doctor.  Follow a diet as told by your doctor. This information is not intended to replace advice given to you by your health care provider. Make sure you discuss any questions you have with your health care provider. Document Released: 07/10/2007 Document Revised: 02/08/2016 Document Reviewed: 02/08/2016 Elsevier Interactive Patient Education  2017 [employer]. Full Liquid Diet A full liquid diet may be used:  To help you transition  from a clear liquid diet to a soft diet.  When your body is healing and can only tolerate foods that are easy to digest.  Before or after certain a procedure, test, or surgery (such as stomach or intestinal surgeries).  If you have trouble swallowing or chewing.  A full liquid diet includes fluids and foods that are liquid or will become liquid at room  temperature. The full liquid diet gives you the proteins, fluids, salts, and minerals that you need for energy. If you continue this diet for more than 72 hours, talk to your health care provider about how many calories you need to consume. If you continue the diet for more than 5 days, talk to your health care provider about taking a multivitamin or a nutritional supplement. What do I need to know about a full liquid diet?  You may have any liquid.  You may have any food that becomes a liquid at room temperature. The food is considered a liquid if it can be poured off a spoon at room temperature.  Drink one serving of citrus or vitamin C-enriched fruit juice daily. What foods can I eat? Grains Any grain food that can be pureed in soup (such as crackers, pasta, and rice). Hot cereal (such as farina or oatmeal) that has been blended. Talk to your health care provider or dietitian about these foods. Vegetables Pulp-free tomato or vegetable juice. Vegetables pureed in soup. Fruits Fruit juice, including nectars and juices with pulp. Meats and Other Protein Sources Eggs in custard, eggnog mix, and eggs used in ice cream or pudding. Strained meats, like in baby food, may be allowed. Consult your health care provider. Dairy Milk and milk-based beverages, including milk shakes and instant breakfast mixes. Smooth yogurt. Pureed cottage cheese. Avoid these foods if they are not well tolerated. Beverages All beverages, including liquid nutritional supplements. Ask your health care provider if you can have carbonated beverages. They may not be well tolerated. Condiments Iodized salt, pepper, spices, and flavorings. Cocoa powder. Vinegar, ketchup, yellow mustard, smooth sauces (such as hollandaise, cheese sauce, or white sauce), and soy sauce. Sweets and Desserts Custard, smooth pudding. Flavored gelatin. Tapioca, junket. Plain ice cream, sherbet, fruit ices. Frozen ice pops, frozen fudge pops, pudding  pops, and other frozen bars with cream. Syrups, including chocolate syrup. Sugar, honey, jelly. Fats and Oils Margarine, butter, cream, sour cream, and oils. Other Broth and cream soups. Strained, broth-based soups. The items listed above may not be a complete list of recommended foods or beverages. Contact your dietitian for more options. What foods can I not eat? Grains All breads. Grains are not allowed unless they are pureed into soup. Vegetables Vegetables are not allowed unless they are juiced, or cooked and pureed into soup. Fruits Fruits are not allowed unless they are juiced. Meats and Other Protein Sources Any meat or fish. Cooked or raw eggs. Nut butters. Dairy Cheese. Condiments Stone ground mustards. Fats and Oils Fats that are coarse or chunky. Sweets and Desserts Ice cream or other frozen desserts that have any solids in them or on top, such as nuts, chocolate chips, and pieces of cookies. Cakes. Cookies. Candy. Others Soups with chunks or pieces in them. The items listed above may not be a complete list of foods and beverages to avoid. Contact your dietitian for more information. This information is not intended to replace advice given to you by your health care provider. Make sure you discuss any questions you have with your health  care provider. Document Released: 01/21/2005 Document Revised: 06/29/2015 Document Reviewed: 11/26/2012 Elsevier Interactive Patient Education  2017 [employer].

## 2016-11-18 NOTE — Telephone Encounter (Signed)
Sched appt 12/06/16 at 11:15. Spoke to pt's wife.

## 2016-11-18 NOTE — Telephone Encounter (Signed)
-----   Message from Mena Goes, RN sent at 11/16/2016  4:56 PM EDT ----- Regarding: see Donzetta Matters in 2 weeks to plan surgery   ----- Message ----- From: Waynetta Sandy, MD Sent: 11/16/2016   1:52 PM To: [facility] [address]  Benjamin Cook [id number] 1946/01/11  Inpatient level 1 F/u with me 2 weeks to plan EVAR

## 2016-11-18 NOTE — Progress Notes (Signed)
Benjamin Cook to be D/C'd Home per MD order.  Discussed with the patient and all questions fully answered.  VSS, Skin clean, dry and intact without evidence of skin break down, no evidence of skin tears noted. IV catheter discontinued intact. Site without signs and symptoms of complications. Dressing and pressure applied.  An After Visit Summary was printed and given to the patient. Patient received prescription.  D/c education completed with patient/family including follow up instructions, medication list, d/c activities limitations if indicated, with other d/c instructions as indicated by MD - patient able to verbalize understanding, all questions fully answered.   Patient instructed to return to ED, call 911, or call MD for any changes in condition.   Patient escorted via [place], and D/C home via private auto.  Dorris Carnes 11/18/2016 8:45 PM

## 2016-11-18 NOTE — Progress Notes (Signed)
  Progress Note    11/18/2016 8:21 AM * No surgery found *  Subjective:  Pain improving.  Vitals:   11/17/16 2212 11/18/16 0611  BP: (!) 151/71 (!) 157/82  Pulse: 80 72  Resp: 20 18  Temp: 98.4 F (36.9 C) 98.3 F (36.8 C)  SpO2: 98% 98%    Physical Exam: aaox3 Non labored respirations Abdomen mildly ttp  CBC    Component Value Date/Time   WBC 11.0 (H) 11/18/2016 0429   RBC 4.35 11/18/2016 0429   HGB 12.7 (L) 11/18/2016 0429   HCT 38.4 (L) 11/18/2016 0429   PLT 209 11/18/2016 0429   MCV 88.3 11/18/2016 0429   MCH 29.2 11/18/2016 0429   MCHC 33.1 11/18/2016 0429   RDW 14.8 11/18/2016 0429   LYMPHSABS 1.3 11/17/2016 0538   MONOABS 1.9 (H) 11/17/2016 0538   EOSABS 0.3 11/17/2016 0538   BASOSABS 0.0 11/17/2016 0538    BMET    Component Value Date/Time   NA 138 11/18/2016 0429   K 3.2 (L) 11/18/2016 0429   CL 104 11/18/2016 0429   CO2 23 11/18/2016 0429   GLUCOSE 86 11/18/2016 0429   BUN <5 (L) 11/18/2016 0429   CREATININE 0.70 11/18/2016 0429   CREATININE 0.88 09/15/2015 0959   CALCIUM 8.7 (L) 11/18/2016 0429   GFRNONAA >60 11/18/2016 0429   GFRAA >60 11/18/2016 0429    INR    Component Value Date/Time   INR 1.1 (H) 05/25/2014 1606     Intake/Output Summary (Last 24 hours) at 11/18/16 6761 Last data filed at 11/17/16 1611  Gross per 24 hour  Intake           259.17 ml  Output                0 ml  Net           259.17 ml     Assessment:  71 y.o. male is here with uncomplicated diverticulitis, feeling better. Has 5.6 cm AAA.  Plan: Will f/u in 2 weeks post discharge to plan EVAR   Brandon C. Donzetta Matters, MD [facility] Office: [phone] Pager: [phone]  11/18/2016 8:21 AM

## 2016-11-19 ENCOUNTER — Ambulatory Visit (INDEPENDENT_AMBULATORY_CARE_PROVIDER_SITE_OTHER): Payer: Medicare Other

## 2016-11-19 ENCOUNTER — Telehealth: Payer: Self-pay

## 2016-11-19 DIAGNOSIS — I5022 Chronic systolic (congestive) heart failure: Secondary | ICD-10-CM | POA: Diagnosis not present

## 2016-11-19 DIAGNOSIS — Z95810 Presence of automatic (implantable) cardiac defibrillator: Secondary | ICD-10-CM

## 2016-11-19 NOTE — Progress Notes (Signed)
EPIC Encounter for ICM Monitoring  Patient Name: Benjamin Cook is a 71 y.o. male Date: 11/19/2016 Primary Care Physican: Renato Shin, MD Primary Cardiologist:Crenshaw Electrophysiologist: Allred Dry Weight: unknown       Attempted call to patient and unable to reach.   Transmission reviewed.    Hospitalized for nausea/vomiting from 10/12 to 10/15   Thoracic impedance abnormal suggesting fluid accumulation since 11/04/2016 and trending toward baseline.  Lasix on hold during hospitalization and was on liquid diet while receiving IV fluids.   Prescribed dosage: Furosemide 40 mg 0.5 tablet (20 mg total) daily (reduced from 40 mg to 20 mg by hospitalist at time of discharge 11/18/2016). Potassium 20 mEq 1 tablet daily.  Labs: 11/18/2016 Creatinine 0.70, BUN <5, Potassium 3.2, Sodium 138, EGFR >60 11/17/2016 Creatinine 0.68, BUN <5, Potassium 3.2, Sodium 136, EGFR >60  11/16/2016 Creatinine 0.83, BUN 7,   Potassium 3.4, Sodium 136, EGFR >60  11/15/2016 Creatinine 0.71, BUN 7,   Potassium 4.0, Sodium 136, EGFR >60  07/17/2016 Creatinine 0.86, BUN 10, Potassium 3.4, Sodium 39, EGFR >60 09/15/2015 Creatinine 0.88, BUN 10, Potassium 4.6, Sodium 142  Recommendations: NONE - Unable to reach patient.    Follow-up plan: ICM clinic phone appointment on 11/28/2016 to recheck fluid levels.    Copy of ICM check sent to Dr. Rayann Heman and Dr. [name].   3 month ICM trend: 11/19/2016   1 Year ICM trend:      [name], RN 11/19/2016 7:58 AM

## 2016-11-19 NOTE — Telephone Encounter (Signed)
Remote ICM transmission received.  Attempted call to patient and no answer or answering machine.  

## 2016-11-20 LAB — CULTURE, BLOOD (ROUTINE X 2)
CULTURE: NO GROWTH
Culture: NO GROWTH
SPECIAL REQUESTS: ADEQUATE
Special Requests: ADEQUATE

## 2016-11-20 NOTE — Discharge Summary (Signed)
[facility] Discharge Summary   Patient: Benjamin Cook:[id number]   PCP: Renato Shin, MD DOB: [birth year]   Date of admission: 11/15/2016   Date of discharge: 11/18/2016    Discharge Diagnoses:  Principal Problem:   Acute Sigmoid diverticulitis Active Problems:   TOBACCO ABUSE   Cardiomyopathy, ischemic   Aneurysm of abdominal vessel (5.6 CM)   ICD (implantable cardioverter-defibrillator) in place   Paroxysmal atrial fibrillation ([place])   ICD (implantable cardioverter-defibrillator) lead failure   Diverticulitis  Admitted From: home Disposition:  home  Recommendations for Outpatient Follow-up:  1. Follow-up with PCP in one week 2. Follow-up with gastroenterology in 2 months 3. Follow up with vascular surgery   Follow-up Information    Renato Shin, MD. Schedule an appointment as soon as possible for a visit in 1 week(s).   Specialty:  Endocrinology Why:  preop clearance for diverticulitis for AAA. Contact information: [address] [place] [address] [phone]        Waynetta Sandy, MD. Schedule an appointment as soon as possible for a visit in 2 week(s).   Specialties:  Vascular Surgery, Cardiology Contact information: [address] [place] [address] [phone]        [facility]. Schedule an appointment as soon as possible for a visit in 2 month(s).   Specialty:  Gastroenterology Contact information: [address] [place] [address] [phone]         Diet recommendation: carb modified diet  Activity: The patient is advised to gradually reintroduce usual activities.  Discharge Condition: good  Code Status: full code  History of present illness: As per the H and P dictated on admission, "Benjamin Cook  is a 71 y.o. male, With past medical history relevant for ischemic myopathy with last known EF of 35%, afebrile, history of AAA, tobacco use disorder and COPD  who presents with a 24-hour history of loose stools, abdominal pain and nausea.  Low-grade temps but no chills no high fevers. Nausea but no vomiting. Last colonoscopy more than 5 years ago, Diarrhea with watery stools (> 12 loose BMs in the last 24 hours),  but no blood or mucus  In ED..... Patient is found to have generalized lower quadrant tenderness, CT abdomen suggested sigmoid diverticulitis, leukocytosis with a white count of 18.9 k noted, ED provider initiated IV Zosyn and fluids  Patient's wife is at bedside, additional history verified. No sick contacts at home."  Hospital Course:  Summary of his active problems in the hospital is as following.  1. Sigmoid diverticulitis. Continue full liquid diet, changed to Augmentin. Avoiding aggressive IV hydration given his history of systolic CHF. Explained that tenesmus is expected. No Imodium.  2. AAA. Size is increased significantly. Vascular surgery was consulted. Patient will have outpatient endovascular procedure scheduled before discharge.  3. Adrenal mass. Patient had an adrenal mass 4.1 cm size back in 2005, unchanged on CT scan this admission.  4. Chronic systolic CHF. History of CAD as well as AICD placement. Currently no volume overload. Patient received gentle IV hydration. Continue Coreg Lipitor and lisinopril. Lasix is currently on hold.  5. Active smoker. COPD. Continue inhalers. Not interested in smoking cessation for now.  All other chronic medical condition were stable during the hospitalization.  Patient was ambulatory without any assistance. On the day of the discharge the patient's vitals were stable, and no other acute medical condition were reported by patient. the patient was felt safe to be discharge at  home with family.  Procedures and Results:  none   Consultations:  Vascular surgery   DISCHARGE MEDICATION: Discharge Medication List as of 11/18/2016  2:59 PM    START taking these  medications   Details  amoxicillin-clavulanate (AUGMENTIN) 500-125 MG tablet Take 1 tablet (500 mg total) by mouth 3 (three) times daily., Starting Mon 11/18/2016, Until Fri 11/22/2016, Normal      CONTINUE these medications which have CHANGED   Details  furosemide (LASIX) 40 MG tablet Take 0.5 tablets (20 mg total) by mouth daily., Starting Mon 11/18/2016, Normal    potassium chloride SA (K-DUR,KLOR-CON) 20 MEQ tablet Take 2 tablets (40 mEq total) by mouth daily., Starting Mon 11/18/2016, Normal      CONTINUE these medications which have NOT CHANGED   Details  atorvastatin (LIPITOR) 80 MG tablet TAKE 1 TABLET BY MOUTH  DAILY AT 6 PM., Normal    carvedilol (COREG) 25 MG tablet TAKE 1 TABLET BY MOUTH TWO  TIMES DAILY WITH A MEAL, Normal    Fluticasone-Salmeterol (ADVAIR) 100-50 MCG/DOSE AEPB Inhale 1 puff into the lungs 2 (two) times daily., Starting Mon 04/10/2015, Normal    lisinopril (PRINIVIL,ZESTRIL) 20 MG tablet TAKE 1 TABLET BY MOUTH  EVERY DAY, Normal    Multiple Vitamin (MULTIVITAMIN) capsule Take 1 capsule by mouth daily.  , Until Discontinued, Historical Med    rivaroxaban (XARELTO) 20 MG TABS tablet Take 1 tablet (20 mg total) by mouth daily with supper., Starting Wed 10/09/2016, Normal       No Known Allergies Discharge Instructions    Diet full liquid    Complete by:  As directed    Discharge instructions    Complete by:  As directed    It is important that you read following instructions as well as go over your medication list with RN to help you understand your care after this hospitalization.  Discharge Instructions: Please follow-up with PCP in one week  Please request your primary care physician to go over all Hospital Tests and Procedure/Radiological results at the follow up,  Please get all Hospital records sent to your PCP by signing hospital release before you go home.   Do not take more than prescribed Pain, Sleep and Anxiety Medications. You were cared  for by a hospitalist during your hospital stay. If you have any questions about your discharge medications or the care you received while you were in the hospital after you are discharged, you can call the unit and ask to speak with the hospitalist on call if the hospitalist that took care of you is not available.  Once you are discharged, your primary care physician will handle any further medical issues. Please note that NO REFILLS for any discharge medications will be authorized once you are discharged, as it is imperative that you return to your primary care physician (or establish a relationship with a primary care physician if you do not have one) for your aftercare needs so that they can reassess your need for medications and monitor your lab values. You Must read complete instructions/literature along with all the possible adverse reactions/side effects for all the Medicines you take and that have been prescribed to you. Take any new Medicines after you have completely understood and accept all the possible adverse reactions/side effects. Wear Seat belts while driving. If you have smoked or chewed Tobacco in the last 2 yrs please stop smoking and/or stop any Recreational drug use.   Increase activity slowly    Complete by:  As directed      Discharge Exam: Filed Weights   11/15/16 1904  Weight: 68 kg (150 lb)   Vitals:   11/18/16 0922 11/18/16 1300  BP:  139/66  Pulse:  83  Resp:    Temp:  98 F (36.7 C)  SpO2: 97% 95%   General: Appear in mild distress, no Rash; Oral Mucosa moist. Cardiovascular: S1 and S2 Present, no Murmur, no JVD Respiratory: Bilateral Air entry present and Clear to Auscultation, no Crackles, no wheezes Abdomen: Bowel Sound present, Soft and no tenderness Extremities: no Pedal edema, no calf tenderness Neurology: Grossly no focal neuro deficit.  The results of significant diagnostics from this hospitalization (including imaging, microbiology, ancillary and  laboratory) are listed below for reference.    Significant Diagnostic Studies: Ct Abdomen Pelvis W Contrast  Result Date: 11/15/2016 CLINICAL DATA:  Suprapubic pain and diarrhea. EXAM: CT ABDOMEN AND PELVIS WITH CONTRAST TECHNIQUE: Multidetector CT imaging of the abdomen and pelvis was performed using the standard protocol following bolus administration of intravenous contrast. CONTRAST:  141mL ISOVUE-300 IOPAMIDOL (ISOVUE-300) INJECTION 61% COMPARISON:  CT abdomen pelvis dated April 13, 2003. FINDINGS: Lower chest: No acute abnormality.  Cardiomegaly. Hepatobiliary: No focal liver abnormality is seen. No gallstones, gallbladder wall thickening, or biliary dilatation. Pancreas: Unremarkable. No pancreatic ductal dilatation or surrounding inflammatory changes. Spleen: Normal in size without focal abnormality. Adrenals/Urinary Tract: Unchanged 4.1 cm right adrenal mass, likely an adenoma. The left adrenal gland is unremarkable. Subcentimeter low-density lesions in both kidneys are too small to characterize. No hydronephrosis. No ureteral calculi. The bladder is partially decompressed. Stomach/Bowel: Sigmoid colonic wall thickening with inflammatory changes surrounding an inflamed diverticulum. Small hiatal hernia. The stomach is otherwise within normal limits. No bowel obstruction. Normal appendix. Vascular/Lymphatic: Severe aortic atherosclerosis with interval increase in size of fusiform infrarenal abdominal aortic aneurysm, now measuring up to 5.6 cm in maximum dimension, previously 3.1 cm. Interval increase in size of ectatic bilateral common iliac arteries. The right common iliac artery measures up to 2.2 cm. Mild aneurysmal dilatation of the proximal right internal iliac artery. Severe atherosclerosis of the mid to distal SMA. No lymphadenopathy. Reproductive: Markedly prostate enlargement with prominent central gland hypertrophy. Other: Trace free fluid in the pelvis. No pneumoperitoneum. Small fat  containing bilateral inguinal hernias. Musculoskeletal: No acute or significant osseous findings. Severe degenerative changes of the lower lumbar spine. IMPRESSION: 1. Acute, uncomplicated sigmoid diverticulitis. 2. Interval increase in size of fusiform, infrarenal abdominal aortic aneurysm, measuring up to 5.6 cm. Vascular surgery consultation recommended due to increased risk of rupture for AAA >5.5 cm. This recommendation follows ACR consensus guidelines: White Paper of the ACR Incidental Findings Committee II on Vascular Findings. J Am Coll Radiol 2013; 10:789-794. 3. Ectasia of the bilateral common iliac arteries, increased when compared to prior study. Mild aneurysmal dilatation of the proximal right internal iliac artery. 4.  Aortic atherosclerosis (ICD10-I70.0). 5. Marked prostatomegaly with prominent central gland hypertrophy. 6. Unchanged 4.1 cm right adrenal mass, likely a benign adenoma. Electronically Signed   By: Titus Dubin M.D.   On: 11/15/2016 10:40    Microbiology: Recent Results (from the past 240 hour(s))  Culture, blood (Routine X 2) w Reflex to ID Panel     Status: None (Preliminary result)   Collection Time: 11/15/16  5:15 PM  Result Value Ref Range Status   Specimen Description BLOOD RIGHT HAND  Final   Special Requests   Final    BOTTLES DRAWN AEROBIC AND ANAEROBIC Blood Culture  adequate volume   Culture NO GROWTH 4 DAYS  Final   Report Status PENDING  Incomplete  Culture, blood (Routine X 2) w Reflex to ID Panel     Status: None (Preliminary result)   Collection Time: 11/15/16  5:25 PM  Result Value Ref Range Status   Specimen Description BLOOD LEFT HAND  Final   Special Requests   Final    BOTTLES DRAWN AEROBIC AND ANAEROBIC Blood Culture adequate volume   Culture NO GROWTH 4 DAYS  Final   Report Status PENDING  Incomplete     Labs: CBC:  Recent Labs Lab 11/15/16 0827 11/16/16 0543 11/17/16 0538 11/18/16 0429  WBC 18.9* 15.9* 12.5* 11.0*  NEUTROABS  --    --  9.0*  --   HGB 14.0 12.3* 12.2* 12.7*  HCT 41.9 37.7* 36.5* 38.4*  MCV 89.5 90.0 88.8 88.3  PLT 163 169 170 502   Basic Metabolic Panel:  Recent Labs Lab 11/15/16 0827 11/16/16 0543 11/17/16 0538 11/18/16 0429  NA 136 136 136 138  K 4.0 3.4* 3.2* 3.2*  CL 104 102 103 104  CO2 24 24 25 23   GLUCOSE 112* 100* 104* 86  BUN 7 7 <5* <5*  CREATININE 0.71 0.83 0.68 0.70  CALCIUM 9.3 8.5* 8.5* 8.7*  MG  --   --  1.7 1.7   Liver Function Tests:  Recent Labs Lab 11/15/16 0827 11/17/16 0538  AST 19 20  ALT 15* 14*  ALKPHOS 70 55  BILITOT 1.3* 1.0  PROT 7.4 6.4*  ALBUMIN 3.4* 2.9*    Recent Labs Lab 11/15/16 0827  LIPASE 17   Time spent: 35 minutes  Signed:  Suttyn Cryder  [facility] 11/18/2016 , 11:42 AM

## 2016-11-22 ENCOUNTER — Telehealth: Payer: Self-pay | Admitting: Endocrinology

## 2016-11-22 ENCOUNTER — Telehealth: Payer: Self-pay

## 2016-11-22 NOTE — Telephone Encounter (Signed)
Go to ER

## 2016-11-22 NOTE — Telephone Encounter (Signed)
I spoke with patient & he stated that he was going to try to wait until Tuesday to come in. If not I advised him to go ahead to the hospital.

## 2016-11-22 NOTE — Telephone Encounter (Signed)
Patient was recently released from the hospital and was told to follow up with Dr. Loanne Drilling- patients wife states he is not feeling any better still having abdominal pain and a lot of diarrhea- no fever or nausea but still feeling very bad- wife wants to know if he should be seen here or if she should take him back to the hospital- please advise

## 2016-11-24 ENCOUNTER — Inpatient Hospital Stay (HOSPITAL_COMMUNITY)
Admission: EM | Admit: 2016-11-24 | Discharge: 2016-11-30 | DRG: 392 | Disposition: A | Discharge status: Home / Self Care | Payer: Medicare Other | Attending: Internal Medicine | Admitting: Internal Medicine

## 2016-11-24 ENCOUNTER — Emergency Department (HOSPITAL_COMMUNITY): Payer: Medicare Other

## 2016-11-24 ENCOUNTER — Encounter (HOSPITAL_COMMUNITY): Payer: Self-pay | Admitting: Emergency Medicine

## 2016-11-24 DIAGNOSIS — I10 Essential (primary) hypertension: Secondary | ICD-10-CM | POA: Diagnosis not present

## 2016-11-24 DIAGNOSIS — K5792 Diverticulitis of intestine, part unspecified, without perforation or abscess without bleeding: Secondary | ICD-10-CM | POA: Diagnosis not present

## 2016-11-24 DIAGNOSIS — I5020 Unspecified systolic (congestive) heart failure: Secondary | ICD-10-CM | POA: Diagnosis present

## 2016-11-24 DIAGNOSIS — K5720 Diverticulitis of large intestine with perforation and abscess without bleeding: Principal | ICD-10-CM | POA: Diagnosis present

## 2016-11-24 DIAGNOSIS — J449 Chronic obstructive pulmonary disease, unspecified: Secondary | ICD-10-CM | POA: Diagnosis not present

## 2016-11-24 DIAGNOSIS — Z95810 Presence of automatic (implantable) cardiac defibrillator: Secondary | ICD-10-CM

## 2016-11-24 DIAGNOSIS — Z79899 Other long term (current) drug therapy: Secondary | ICD-10-CM

## 2016-11-24 DIAGNOSIS — I472 Ventricular tachycardia: Secondary | ICD-10-CM | POA: Diagnosis present

## 2016-11-24 DIAGNOSIS — I5042 Chronic combined systolic (congestive) and diastolic (congestive) heart failure: Secondary | ICD-10-CM | POA: Diagnosis present

## 2016-11-24 DIAGNOSIS — E876 Hypokalemia: Secondary | ICD-10-CM | POA: Diagnosis present

## 2016-11-24 DIAGNOSIS — E279 Disorder of adrenal gland, unspecified: Secondary | ICD-10-CM | POA: Diagnosis present

## 2016-11-24 DIAGNOSIS — K4090 Unilateral inguinal hernia, without obstruction or gangrene, not specified as recurrent: Secondary | ICD-10-CM | POA: Diagnosis not present

## 2016-11-24 DIAGNOSIS — N400 Enlarged prostate without lower urinary tract symptoms: Secondary | ICD-10-CM | POA: Diagnosis not present

## 2016-11-24 DIAGNOSIS — Z8379 Family history of other diseases of the digestive system: Secondary | ICD-10-CM

## 2016-11-24 DIAGNOSIS — K4020 Bilateral inguinal hernia, without obstruction or gangrene, not specified as recurrent: Secondary | ICD-10-CM | POA: Diagnosis present

## 2016-11-24 DIAGNOSIS — K5732 Diverticulitis of large intestine without perforation or abscess without bleeding: Secondary | ICD-10-CM | POA: Diagnosis present

## 2016-11-24 DIAGNOSIS — I723 Aneurysm of iliac artery: Secondary | ICD-10-CM | POA: Diagnosis present

## 2016-11-24 DIAGNOSIS — R739 Hyperglycemia, unspecified: Secondary | ICD-10-CM | POA: Diagnosis present

## 2016-11-24 DIAGNOSIS — I5022 Chronic systolic (congestive) heart failure: Secondary | ICD-10-CM | POA: Diagnosis not present

## 2016-11-24 DIAGNOSIS — I2510 Atherosclerotic heart disease of native coronary artery without angina pectoris: Secondary | ICD-10-CM | POA: Diagnosis not present

## 2016-11-24 DIAGNOSIS — I255 Ischemic cardiomyopathy: Secondary | ICD-10-CM | POA: Diagnosis not present

## 2016-11-24 DIAGNOSIS — R972 Elevated prostate specific antigen [PSA]: Secondary | ICD-10-CM

## 2016-11-24 DIAGNOSIS — E785 Hyperlipidemia, unspecified: Secondary | ICD-10-CM | POA: Diagnosis present

## 2016-11-24 DIAGNOSIS — L4050 Arthropathic psoriasis, unspecified: Secondary | ICD-10-CM | POA: Diagnosis not present

## 2016-11-24 DIAGNOSIS — I739 Peripheral vascular disease, unspecified: Secondary | ICD-10-CM | POA: Diagnosis present

## 2016-11-24 DIAGNOSIS — Z955 Presence of coronary angioplasty implant and graft: Secondary | ICD-10-CM

## 2016-11-24 DIAGNOSIS — Z7901 Long term (current) use of anticoagulants: Secondary | ICD-10-CM | POA: Diagnosis not present

## 2016-11-24 DIAGNOSIS — I252 Old myocardial infarction: Secondary | ICD-10-CM | POA: Diagnosis not present

## 2016-11-24 DIAGNOSIS — F17200 Nicotine dependence, unspecified, uncomplicated: Secondary | ICD-10-CM | POA: Diagnosis present

## 2016-11-24 DIAGNOSIS — I480 Paroxysmal atrial fibrillation: Secondary | ICD-10-CM | POA: Diagnosis present

## 2016-11-24 DIAGNOSIS — Z791 Long term (current) use of non-steroidal anti-inflammatories (NSAID): Secondary | ICD-10-CM

## 2016-11-24 DIAGNOSIS — F411 Generalized anxiety disorder: Secondary | ICD-10-CM | POA: Diagnosis present

## 2016-11-24 DIAGNOSIS — F17290 Nicotine dependence, other tobacco product, uncomplicated: Secondary | ICD-10-CM | POA: Diagnosis present

## 2016-11-24 DIAGNOSIS — I110 Hypertensive heart disease with heart failure: Secondary | ICD-10-CM | POA: Diagnosis present

## 2016-11-24 DIAGNOSIS — I714 Abdominal aortic aneurysm, without rupture, unspecified: Secondary | ICD-10-CM | POA: Diagnosis present

## 2016-11-24 DIAGNOSIS — Z9849 Cataract extraction status, unspecified eye: Secondary | ICD-10-CM

## 2016-11-24 DIAGNOSIS — R7309 Other abnormal glucose: Secondary | ICD-10-CM | POA: Diagnosis present

## 2016-11-24 DIAGNOSIS — I5023 Acute on chronic systolic (congestive) heart failure: Secondary | ICD-10-CM | POA: Diagnosis present

## 2016-11-24 DIAGNOSIS — E278 Other specified disorders of adrenal gland: Secondary | ICD-10-CM

## 2016-11-24 LAB — URINALYSIS, ROUTINE W REFLEX MICROSCOPIC
Bilirubin Urine: NEGATIVE
Glucose, UA: NEGATIVE mg/dL
Ketones, ur: NEGATIVE mg/dL
Leukocytes, UA: NEGATIVE
NITRITE: NEGATIVE
PROTEIN: 100 mg/dL — AB
RBC / HPF: NONE SEEN RBC/hpf (ref 0–5)
SPECIFIC GRAVITY, URINE: 1.025 (ref 1.005–1.030)
SQUAMOUS EPITHELIAL / LPF: NONE SEEN
pH: 5 (ref 5.0–8.0)

## 2016-11-24 LAB — CBC
HEMATOCRIT: 41 % (ref 39.0–52.0)
Hemoglobin: 14 g/dL (ref 13.0–17.0)
MCH: 30.2 pg (ref 26.0–34.0)
MCHC: 34.1 g/dL (ref 30.0–36.0)
MCV: 88.4 fL (ref 78.0–100.0)
PLATELETS: 300 10*3/uL (ref 150–400)
RBC: 4.64 MIL/uL (ref 4.22–5.81)
RDW: 14.5 % (ref 11.5–15.5)
WBC: 17.1 10*3/uL — ABNORMAL HIGH (ref 4.0–10.5)

## 2016-11-24 LAB — COMPREHENSIVE METABOLIC PANEL
ALBUMIN: 3.1 g/dL — AB (ref 3.5–5.0)
ALT: 14 U/L — AB (ref 17–63)
AST: 20 U/L (ref 15–41)
Alkaline Phosphatase: 65 U/L (ref 38–126)
Anion gap: 5 (ref 5–15)
BILIRUBIN TOTAL: 0.6 mg/dL (ref 0.3–1.2)
BUN: 7 mg/dL (ref 6–20)
CO2: 28 mmol/L (ref 22–32)
CREATININE: 0.81 mg/dL (ref 0.61–1.24)
Calcium: 9.2 mg/dL (ref 8.9–10.3)
Chloride: 102 mmol/L (ref 101–111)
GFR calc Af Amer: >60 mL/min (ref >60)
GLUCOSE: 111 mg/dL — AB (ref 65–99)
POTASSIUM: 3.9 mmol/L (ref 3.5–5.1)
Sodium: 135 mmol/L (ref 135–145)
TOTAL PROTEIN: 7.9 g/dL (ref 6.5–8.1)

## 2016-11-24 LAB — I-STAT CG4 LACTIC ACID, ED: LACTIC ACID, VENOUS: 0.93 mmol/L (ref 0.5–1.9)

## 2016-11-24 LAB — LIPASE, BLOOD: Lipase: 26 U/L (ref 11–51)

## 2016-11-24 MED ORDER — PIPERACILLIN-TAZOBACTAM 3.375 G IVPB 30 MIN
3.3750 g | Freq: Once | INTRAVENOUS | Status: AC
  Administered 2016-11-24: 3.375 g via INTRAVENOUS
  Filled 2016-11-24: qty 50

## 2016-11-24 MED ORDER — ATORVASTATIN CALCIUM 80 MG PO TABS
80.0000 mg | ORAL_TABLET | Freq: Every day | ORAL | Status: DC
  Administered 2016-11-24 – 2016-11-29 (×6): 80 mg via ORAL
  Filled 2016-11-24 (×6): qty 1

## 2016-11-24 MED ORDER — SENNOSIDES-DOCUSATE SODIUM 8.6-50 MG PO TABS: 1.0000 | ORAL_TABLET | Freq: Every evening | ORAL | Status: DC | PRN

## 2016-11-24 MED ORDER — PANTOPRAZOLE SODIUM 40 MG IV SOLR
40.0000 mg | Freq: Every day | INTRAVENOUS | Status: DC
  Administered 2016-11-24 – 2016-11-27 (×4): 40 mg via INTRAVENOUS
  Filled 2016-11-24 (×4): qty 40

## 2016-11-24 MED ORDER — SODIUM CHLORIDE 0.9 % IV SOLN
INTRAVENOUS | Status: DC
  Administered 2016-11-24 – 2016-11-25 (×2): via INTRAVENOUS

## 2016-11-24 MED ORDER — IOPAMIDOL (ISOVUE-300) INJECTION 61%
INTRAVENOUS | Status: AC
  Administered 2016-11-24: 100 mL
  Filled 2016-11-24: qty 100

## 2016-11-24 MED ORDER — CIPROFLOXACIN IN D5W 400 MG/200ML IV SOLN
400.0000 mg | Freq: Two times a day (BID) | INTRAVENOUS | Status: DC
  Administered 2016-11-24 – 2016-11-25 (×2): 400 mg via INTRAVENOUS
  Filled 2016-11-24 (×2): qty 200

## 2016-11-24 MED ORDER — BISACODYL 10 MG RE SUPP: 10.0000 mg | Freq: Every day | RECTAL | Status: DC | PRN

## 2016-11-24 MED ORDER — ACETAMINOPHEN 325 MG PO TABS
650.0000 mg | ORAL_TABLET | Freq: Four times a day (QID) | ORAL | Status: DC | PRN
  Administered 2016-11-25: 650 mg via ORAL
  Filled 2016-11-24: qty 2

## 2016-11-24 MED ORDER — HYDROCODONE-ACETAMINOPHEN 5-325 MG PO TABS
1.0000 | ORAL_TABLET | ORAL | Status: DC | PRN
  Administered 2016-11-24: 1 via ORAL
  Administered 2016-11-25: 2 via ORAL
  Administered 2016-11-25 (×2): 1 via ORAL
  Filled 2016-11-24 (×3): qty 1
  Filled 2016-11-24: qty 2

## 2016-11-24 MED ORDER — LISINOPRIL 20 MG PO TABS: 20.0000 mg | ORAL_TABLET | Freq: Every day | ORAL | Status: DC

## 2016-11-24 MED ORDER — PIPERACILLIN-TAZOBACTAM 3.375 G IVPB: 3.3750 g | Freq: Three times a day (TID) | INTRAVENOUS | Status: DC

## 2016-11-24 MED ORDER — POTASSIUM CHLORIDE CRYS ER 20 MEQ PO TBCR
40.0000 meq | EXTENDED_RELEASE_TABLET | Freq: Every day | ORAL | Status: DC
  Administered 2016-11-24 – 2016-11-30 (×7): 40 meq via ORAL
  Filled 2016-11-24 (×6): qty 2

## 2016-11-24 MED ORDER — METRONIDAZOLE IN NACL 5-0.79 MG/ML-% IV SOLN
500.0000 mg | Freq: Three times a day (TID) | INTRAVENOUS | Status: DC
  Administered 2016-11-24 – 2016-11-25 (×2): 500 mg via INTRAVENOUS
  Filled 2016-11-24 (×3): qty 100

## 2016-11-24 MED ORDER — FUROSEMIDE 20 MG PO TABS
20.0000 mg | ORAL_TABLET | Freq: Every day | ORAL | Status: DC
  Administered 2016-11-24 – 2016-11-30 (×7): 20 mg via ORAL
  Filled 2016-11-24 (×7): qty 1

## 2016-11-24 MED ORDER — MOMETASONE FURO-FORMOTEROL FUM 100-5 MCG/ACT IN AERO
2.0000 | INHALATION_SPRAY | Freq: Two times a day (BID) | RESPIRATORY_TRACT | Status: DC
  Administered 2016-11-25 – 2016-11-30 (×11): 2 via RESPIRATORY_TRACT
  Filled 2016-11-24: qty 8.8

## 2016-11-24 MED ORDER — LISINOPRIL 20 MG PO TABS
20.0000 mg | ORAL_TABLET | Freq: Every day | ORAL | Status: DC
  Administered 2016-11-24 – 2016-11-27 (×4): 20 mg via ORAL
  Filled 2016-11-24 (×4): qty 1

## 2016-11-24 MED ORDER — CARVEDILOL 25 MG PO TABS
25.0000 mg | ORAL_TABLET | Freq: Two times a day (BID) | ORAL | Status: DC
  Administered 2016-11-24 – 2016-11-30 (×12): 25 mg via ORAL
  Filled 2016-11-24 (×12): qty 1

## 2016-11-24 MED ORDER — SODIUM CHLORIDE 0.9 % IV BOLUS (SEPSIS)
500.0000 mL | Freq: Once | INTRAVENOUS | Status: AC
  Administered 2016-11-24: 500 mL via INTRAVENOUS

## 2016-11-24 MED ORDER — ONDANSETRON HCL 4 MG/2ML IJ SOLN
4.0000 mg | Freq: Four times a day (QID) | INTRAMUSCULAR | Status: DC | PRN
Start: 2016-11-24 — End: 2016-11-30
  Administered 2016-11-24: 4 mg via INTRAVENOUS
  Filled 2016-11-24: qty 2

## 2016-11-24 MED ORDER — LISINOPRIL 20 MG PO TABS
20.0000 mg | ORAL_TABLET | Freq: Every day | ORAL | Status: DC
Start: 2016-11-25 — End: 2016-11-24

## 2016-11-24 MED ORDER — ONDANSETRON HCL 4 MG PO TABS: 4.0000 mg | ORAL_TABLET | Freq: Four times a day (QID) | ORAL | Status: DC | PRN

## 2016-11-24 MED ORDER — MORPHINE SULFATE (PF) 4 MG/ML IV SOLN
4.0000 mg | Freq: Once | INTRAVENOUS | Status: AC
  Administered 2016-11-24: 4 mg via INTRAVENOUS
  Filled 2016-11-24: qty 1

## 2016-11-24 MED ORDER — ACETAMINOPHEN 650 MG RE SUPP: 650.0000 mg | Freq: Four times a day (QID) | RECTAL | Status: DC | PRN

## 2016-11-24 MED ORDER — WITCH HAZEL-GLYCERIN EX PADS
1.0000 "application " | MEDICATED_PAD | CUTANEOUS | Status: DC | PRN
  Filled 2016-11-24: qty 100

## 2016-11-24 NOTE — ED Triage Notes (Addendum)
Pt states diarrhea and abdominal pain for 9 days, recent admission for diverticulitis. Pt has not improved since discharge. Pt still having diarrhea. Denies nausea/emesis. Just finished his course of Augmentin. Pt states "this all started the day after I got my flu shot and pneumonia shot."

## 2016-11-24 NOTE — ED Provider Notes (Signed)
[facility] Provider Note   CSN: [id number] Arrival date & time: 11/24/16  1007     History   Chief Complaint Chief Complaint  Patient presents with  . Abdominal Pain  . Diverticulitis    HPI Benjamin Cook is a 71 y.o. male who presents with abdominal pain. PMH significant for recent diverticulitis, COPD, CHF, CAD, HTN, HLD, AAA. He states that since he was discharged the hospital he has not felt better. He reports waves of lower abdominal pain that occurs every time he needs to have a bowel movement. When he goes to have a BM, it is a very small amount and sometimes watery. He reports associated "chills" (states "I'm cold all the time") and decreased appetite. He denies fever, chest pain, SOB, upper abdominal pain, N/V, dysuria. He has been taking Ibuprofen for pain without significant relief. He has finished the entire course of antibiotics.   HPI  Past Medical History:  Diagnosis Date  . Adrenal mass ([place])    per pt this is remote (10 years) and benign by biopsy  . AICD (automatic cardioverter/defibrillator) present   . Anxiety   . CAD (coronary artery disease)    a. s/p PCI to LAD 2001 b. myoview 2014 high risk with scar LAD/RCA territory but no ischemia  . Cardiomyopathy, ischemic   . CHF (congestive heart failure) ([name])    class II/III  . COPD (chronic obstructive pulmonary disease) ([name])    smokes cigars but has quit cigarettes  . Flu 03/2015  . HTN (hypertension)   . Hyperlipidemia   . Paroxysmal atrial fibrillation ([name]) 03/2015   chads2vasc score is 4  . PSA (psoriatic arthritis) ([place])    increased  . PVD (peripheral vascular disease) [facility])     Patient Active Problem List   Diagnosis Date Noted  . Acute Sigmoid diverticulitis 11/15/2016  . Diverticulitis 11/15/2016  . ICD (implantable cardioverter-defibrillator) lead failure 07/16/2016  . Paroxysmal atrial fibrillation ([place]) 06/26/2015  . Dyspnea 04/10/2015  .  Acute respiratory failure ([place]) 03/28/2015  . Essential hypertension 03/28/2015  . Systolic CHF ([place]) 02/12/3233  . Pyrexia   . Shortness of breath   . Influenza A   . VT (ventricular tachycardia) ([place])   . ICD (implantable cardioverter-defibrillator) in place 06/08/2010  . Chronic systolic heart failure ([place]) 04/18/2010  . WEIGHT LOSS 11/01/2008  . TOBACCO ABUSE 09/06/2008  . Aneurysm of abdominal vessel (5.6 CM) 09/06/2008  . HLD (hyperlipidemia) 09/05/2008  . COUGH 04/08/2007  . PSA, INCREASED 04/08/2007  . OTHER ABNORMAL BLOOD CHEMISTRY 03/24/2007  . ADRENAL MASS 10/01/2006  . ANXIETY 10/01/2006  . HYPERTENSION 10/01/2006  . Coronary atherosclerosis 10/01/2006  . Cardiomyopathy, ischemic 10/01/2006  . COPD, severe ([facility]) 10/01/2006  . HYPERGLYCEMIA 10/01/2006  . HEMATURIA, HX OF 10/01/2006    Past Surgical History:  Procedure Laterality Date  . cataract surgery    . CORONARY ANGIOPLASTY WITH STENT PLACEMENT  2001   a. PCI to LAD  . IMPLANTABLE CARDIOVERTER DEFIBRILLATOR (ICD) GENERATOR CHANGE N/A 03/18/2014   a. SJM Fortify ST DR ICD implanted by [facility] for primary prevention b. gen change 03/2014   . LEAD REVISION/REPAIR N/A 07/17/2016   Procedure: Lead Revision/Repair;  Surgeon: Evans Lance, MD;  Location: [place] CV [facility];  Service: Cardiovascular;  Laterality: N/A;  . LEFT HEART CATHETERIZATION WITH CORONARY ANGIOGRAM N/A 05/27/2014   Procedure: LEFT HEART CATHETERIZATION WITH CORONARY ANGIOGRAM;  Surgeon: Sherren Mocha, MD;  Location: [facility] CATH LAB;  Service: Cardiovascular;  Laterality: N/A;     Home Medications    Prior to Admission medications   Medication Sig Start Date End Date Taking? Authorizing Provider  atorvastatin (LIPITOR) 80 MG tablet TAKE 1 TABLET BY MOUTH  DAILY AT 6 PM. Patient taking differently: TAKE 80mg  BY MOUTH  DAILY AT 6 PM. 10/23/16   Allred, Jeneen Rinks, MD  carvedilol (COREG) 25 MG tablet TAKE 1 TABLET BY MOUTH TWO  TIMES DAILY WITH A  MEAL Patient taking differently: TAKE 25mg  BY MOUTH TWO  TIMES DAILY WITH A MEAL 10/23/16   Allred, Jeneen Rinks, MD  Fluticasone-Salmeterol (ADVAIR) 100-50 MCG/DOSE AEPB Inhale 1 puff into the lungs 2 (two) times daily. 04/10/15   Renato Shin, MD  furosemide (LASIX) 40 MG tablet Take 0.5 tablets (20 mg total) by mouth daily. 11/18/16   Lavina Hamman, MD  lisinopril (PRINIVIL,ZESTRIL) 20 MG tablet TAKE 1 TABLET BY MOUTH  EVERY DAY Patient taking differently: TAKE 20mg   BY MOUTH  EVERY DAY 10/23/16   Allred, Jeneen Rinks, MD  Multiple Vitamin (MULTIVITAMIN) capsule Take 1 capsule by mouth daily.      [provider]  potassium chloride SA (K-DUR,KLOR-CON) 20 MEQ tablet Take 2 tablets (40 mEq total) by mouth daily. 11/18/16   Lavina Hamman, MD  rivaroxaban (XARELTO) 20 MG TABS tablet Take 1 tablet (20 mg total) by mouth daily with supper. 10/09/16   Thompson Grayer, MD    Family History Family History  Problem Relation Age of Onset  . Cirrhosis Mother        due to ETOH  . Cancer Neg Hx     Social History Social History  Substance Use Topics  . Smoking status: Current Every Day Smoker    Types: Cigars  . Smokeless tobacco: Never Used     Comment: smokes cigars now, no longer smokes cigarettes but previously smoked 2ppd  . Alcohol use No     Allergies   Patient has no known allergies.   Review of Systems Review of Systems  Constitutional: Positive for appetite change and chills. Negative for fever.  Respiratory: Negative for shortness of breath.   Cardiovascular: Negative for chest pain.  Gastrointestinal: Positive for abdominal pain and diarrhea. Negative for blood in stool, constipation, nausea and vomiting.       +tenesmus  Genitourinary: Negative for dysuria and frequency.  All other systems reviewed and are negative.    Physical Exam Updated Vital Signs BP (!) 158/87   Pulse 84   Temp 97.8 F (36.6 C)   Resp 18   SpO2 100%   Physical Exam  Constitutional: He is  oriented to person, place, and time. He appears well-developed and well-nourished. No distress.  Chronically ill appearing, NAD  HENT:  Head: Normocephalic and atraumatic.  Eyes: Pupils are equal, round, and reactive to light. Conjunctivae are normal. Right eye exhibits no discharge. Left eye exhibits no discharge. No scleral icterus.  Neck: Normal range of motion.  Cardiovascular: Normal rate and regular rhythm.  Exam reveals no gallop and no friction rub.   No murmur heard. Pulmonary/Chest: Effort normal and breath sounds normal. No respiratory distress. He has no wheezes. He has no rales. He exhibits no tenderness.  Abdominal: Soft. Bowel sounds are normal. He exhibits no distension and no mass. There is no tenderness. There is no rebound and no guarding. No hernia.  Neurological: He is alert and oriented to person, place, and time.  Skin: Skin is warm and dry.  Psychiatric: He  has a normal mood and affect. His behavior is normal.  Nursing note and vitals reviewed.    ED Treatments / Results  Labs (all labs ordered are listed, but only abnormal results are displayed) Labs Reviewed  COMPREHENSIVE METABOLIC PANEL - Abnormal; Notable for the following:       Result Value   Glucose, Bld 111 (*)    Albumin 3.1 (*)    ALT 14 (*)    All other components within normal limits  CBC - Abnormal; Notable for the following:    WBC 17.1 (*)    All other components within normal limits  URINALYSIS, ROUTINE W REFLEX MICROSCOPIC - Abnormal; Notable for the following:    APPearance TURBID (*)    Hgb urine dipstick MODERATE (*)    Protein, ur 100 (*)    Bacteria, UA RARE (*)    All other components within normal limits  LIPASE, BLOOD  COMPREHENSIVE METABOLIC PANEL  CBC  PROTIME-INR  I-STAT CG4 LACTIC ACID, ED    EKG  EKG Interpretation None       Radiology Ct Abdomen Pelvis W Contrast  Result Date: 11/24/2016 CLINICAL DATA:  71 year old male with abdominal and pelvic pain for 1  week. EXAM: CT ABDOMEN AND PELVIS WITH CONTRAST TECHNIQUE: Multidetector CT imaging of the abdomen and pelvis was performed using the standard protocol following bolus administration of intravenous contrast. CONTRAST:  163mL ISOVUE-300 IOPAMIDOL (ISOVUE-300) INJECTION 61% COMPARISON:  11/15/2016 and prior CTs FINDINGS: Lower chest: No acute abnormality. Hepatobiliary: The liver and gallbladder are unremarkable. No biliary dilatation. Pancreas: Unremarkable Spleen: Unremarkable Adrenals/Urinary Tract: A 3.1 x 4 cm low-density right adrenal mass is unchanged from 2005-benign. Small bilateral renal cysts are present. There is no evidence of hydronephrosis. The visualized portions of the bladder are unremarkable. Stomach/Bowel: New 5.5 x 7.6 x 5 cm ill-defined phlegmonous change with foci of free air noted adjacent to the sigmoid colon at this site of recent acute diverticulitis. No discrete abscess is identified. There is no evidence of bowel obstruction. The appendix is normal. Vascular/Lymphatic: A 5.5 cm infrarenal suprailiac abdominal aortic aneurysm is again noted with unchanged 2.2 cm right common iliac and 2 cm left common iliac artery aneurysms. No enlarged lymph nodes. Reproductive: Marked prostate enlargement again noted. Other: No ascites. Small bilateral inguinal hernias containing fat again identified. Musculoskeletal: No acute or does suspicious focal bony abnormality. Moderate to severe degenerative disc disease and spondylosis in the lower lumbar spine again noted. IMPRESSION: 1. Increasing inflammatory changes from recent acute sigmoid diverticulitis, now with new 5.5 x 7.6 x 5 cm ill-defined phlegmon with foci of free air adjacent to the sigmoid colon. No discrete abscess identified. No evidence of bowel obstruction. 2. 5.5 cm infrarenal suprailiac abdominal aortic aneurysm, unchanged from 11/15/2016, but significantly increased from 2005. As noted previously, vascular surgery consultation recommended  if not performed. 3. Common iliac artery aneurysms as described 4. Marked prostate enlargement and small bilateral inguinal hernias containing fat 5. Benign right adrenal mass/adenoma. Electronically Signed   By: Margarette Canada M.D.   On: 11/24/2016 15:03    Procedures Procedures (including critical care time)  Medications Ordered in ED Medications  piperacillin-tazobactam (ZOSYN) IVPB 3.375 g (not administered)  piperacillin-tazobactam (ZOSYN) IVPB 3.375 g (not administered)  morphine 4 MG/ML injection 4 mg (not administered)  iopamidol (ISOVUE-300) 61 % injection (100 mLs  Contrast Given 11/24/16 1421)     Initial Impression / Assessment and Plan / ED Course  I have reviewed the triage  vital signs and the nursing notes.  Pertinent labs & imaging results that were available during my care of the patient were reviewed by me and considered in my medical decision making (see chart for details).  71 year old male presents with worsening lower abdominal pain after being diagnosed with diverticulitis. He is hypertensive but otherwise vitals are normal. Abdomen is soft, benign. CBC is remarkable for increase in WBC to 17.1. CMP is overall unremarkable. Lactic acid is normal. CT was ordered due to ongoing pain and increase in white count.  CT shows worsening sigmoid diverticulitis with phelgmon and small amount of free air. Spoke with general surgery who will consult on patient. Spoke with [facility] who will admit.  Final Clinical Impressions(s) / ED Diagnoses   Final diagnoses:  Sigmoid diverticulitis    New Prescriptions New Prescriptions   No medications on file     Iris Pert 11/24/16 1914    Malvin Johns, MD 11/25/16 1122

## 2016-11-24 NOTE — H&P (Signed)
History and Physical    Benjamin Cook GYJ:[id number] DOB: 16-Apr-1945 DOA: 11/24/2016   PCP: Renato Shin, MD   Patient coming from:  Home    Chief Complaint: lower abdominal pain   HPI: Benjamin Cook is a 71 y.o. male with medical history significant for ischemic cardiomyopathy, last known EF 35%, history ofabdominal aortic aneurysm, tobacco abuse, COPD, and recent admission for diverticulitis from 1013 through 11/18/2016 treated with IV fluids, IV Zosyn, discharged on Augmentin, which he never filled out. He states since discharge, the patient did not feeling well. He presents today with generalized lower quadrant tenderness, especially on the left, nausea without vomiting. He denies any diarrhea. He denies any food poisoning. He also denies eating any spicy foods, or seedy foods such as tomatoes or nuts.he denies any fever, chills or night sweats. He denies any chest pain or palpitations. He denies any shortness of breath or cough.he denies any lower extremity swelling. He continues to smoke, he denies any recreational drug use.  ED Course:  BP (!) 158/61 (BP Location: Right Arm)   Pulse 89   Temp 97.8 F (36.6 C)   Resp 18   SpO2 97%    CT of the abdomen and pelvis. Increasing inflammatory changes from recent acute sigmoid diverticulitis, now with new 5.5 x 7.6 x 5 cm ill-defined phlegmon with foci of free air adjacent to the sigmoid colon. No discrete abscess identified. No evidence of bowel obstruction . 2. 5.5 cm infrarenal suprailiac abdominal aortic aneurysm, unchanged from 11/15/2016, but significantly increased from 2005. As noted previously, vascular surgery consultation recommended if not performed.  3. Common iliac artery aneurysms as described  4. Marked prostate enlargement and small bilateral inguinal hernias containing fat 5. Benign right adrenal mass/adenoma. White count is 17.1. CMP is overall unremarkable. Lactic acid is normal. Received IV Zosyn, 500 cc bolus IV  NS and pain meds   Review of Systems:  As per HPI otherwise all other systems reviewed and are negative  Past Medical History:  Diagnosis Date  . Adrenal mass ([place])    per pt this is remote (10 years) and benign by biopsy  . AICD (automatic cardioverter/defibrillator) present   . Anxiety   . CAD (coronary artery disease)    a. s/p PCI to LAD 2001 b. myoview 2014 high risk with scar LAD/RCA territory but no ischemia  . Cardiomyopathy, ischemic   . CHF (congestive heart failure) ([name])    class II/III  . COPD (chronic obstructive pulmonary disease) ([name])    smokes cigars but has quit cigarettes  . Flu 03/2015  . HTN (hypertension)   . Hyperlipidemia   . Paroxysmal atrial fibrillation ([name]) 03/2015   chads2vasc score is 4  . PSA (psoriatic arthritis) ([place])    increased  . PVD (peripheral vascular disease) ([place])     Past Surgical History:  Procedure Laterality Date  . cataract surgery    . CORONARY ANGIOPLASTY WITH STENT PLACEMENT  2001   a. PCI to LAD  . IMPLANTABLE CARDIOVERTER DEFIBRILLATOR (ICD) GENERATOR CHANGE N/A 03/18/2014   a. SJM Fortify ST DR ICD implanted by [facility] for primary prevention b. gen change 03/2014   . LEAD REVISION/REPAIR N/A 07/17/2016   Procedure: Lead Revision/Repair;  Surgeon: Evans Lance, MD;  Location: [facility];  Service: Cardiovascular;  Laterality: N/A;  . LEFT HEART CATHETERIZATION WITH CORONARY ANGIOGRAM N/A 05/27/2014   Procedure: LEFT HEART CATHETERIZATION WITH CORONARY ANGIOGRAM;  Surgeon: Sherren Mocha, MD;  Location:  [facility];  Service: Cardiovascular;  Laterality: N/A;    Social History Social History   Social History  . Marital status: Married    Spouse name: N/A  . Number of children: N/A  . Years of education: N/A   Occupational History  . Lawn care    Social History Main Topics  . Smoking status: Current Every Day Smoker    Types: Cigars  . Smokeless tobacco: Never Used     Comment: smokes cigars now, no longer  smokes cigarettes but previously smoked 2ppd  . Alcohol use No  . Drug use: No  . Sexual activity: Not Currently    Partners: Female    Birth control/ protection: None   Other Topics Concern  . Not on file   Social History Narrative  . No narrative on file     No Known Allergies  Family History  Problem Relation Age of Onset  . Cirrhosis Mother        due to ETOH  . Cancer Neg Hx       Prior to Admission medications   Medication Sig Start Date End Date Taking? Authorizing Provider  atorvastatin (LIPITOR) 80 MG tablet TAKE 1 TABLET BY MOUTH  DAILY AT 6 PM. Patient taking differently: TAKE 80mg  BY MOUTH  DAILY AT 6 PM. 10/23/16  Yes Allred, Jeneen Rinks, MD  carvedilol (COREG) 25 MG tablet TAKE 1 TABLET BY MOUTH TWO  TIMES DAILY WITH A MEAL Patient taking differently: TAKE 25mg  BY MOUTH TWO  TIMES DAILY WITH A MEAL 10/23/16  Yes Allred, Jeneen Rinks, MD  Fluticasone-Salmeterol (ADVAIR) 100-50 MCG/DOSE AEPB Inhale 1 puff into the lungs 2 (two) times daily. 04/10/15  Yes Renato Shin, MD  furosemide (LASIX) 40 MG tablet Take 0.5 tablets (20 mg total) by mouth daily. 11/18/16  Yes Lavina Hamman, MD  Ibuprofen-Diphenhydramine Cit (ADVIL PM PO) Take 1 tablet by mouth daily as needed (AT BEDTIME FOR SLEEP).   Yes [provider]  lisinopril (PRINIVIL,ZESTRIL) 20 MG tablet TAKE 1 TABLET BY MOUTH  EVERY DAY Patient taking differently: TAKE 20mg   BY MOUTH  EVERY DAY 10/23/16  Yes Allred, Jeneen Rinks, MD  liver oil-zinc oxide (DESITIN) 40 % ointment Apply 1 application topically as needed for irritation.   Yes [provider]  Multiple Vitamin (MULTIVITAMIN) capsule Take 1 capsule by mouth daily.     Yes [provider]  potassium chloride SA (K-DUR,KLOR-CON) 20 MEQ tablet Take 2 tablets (40 mEq total) by mouth daily. 11/18/16  Yes Lavina Hamman, MD  rivaroxaban (XARELTO) 20 MG TABS tablet Take 1 tablet (20 mg total) by mouth daily with supper. 10/09/16  Yes Allred, Jeneen Rinks, MD  witch  hazel-glycerin (TUCKS) pad Apply 1 application topically as needed for itching or irritation.   Yes [provider]    Physical Exam:  Vitals:   11/24/16 1031 11/24/16 1525  BP: (!) 158/87 (!) 158/61  Pulse: 84 89  Resp: 18 18  Temp: 97.8 F (36.6 C)   SpO2: 100% 97%   Constitutional: NAD, calm, somewhat uncomfortable due to diverticulitis  Eyes: PERRL, lids and conjunctivae normal ENMT: Mucous membranes are moist, without exudate or lesions. Several missing teeth  Neck: normal, supple, no masses, no thyromegaly Respiratory: clear to auscultation bilaterally, no wheezing, no crackles. Normal respiratory effort  Cardiovascular: Regular rate and rhythm,  murmur, rubs or gallops. No extremity edema. 2+ pedal pulses. No carotid bruits.  Abdomen: Soft, tender to palpation on LLQ with guarding  No  hepatosplenomegaly. Bowel sounds positive.  Musculoskeletal: no clubbing / cyanosis. Moves all extremities Skin: no jaundice, No lesions.  Neurologic: Sensation intact  Strength equal in all extremities Psychiatric:   Alert and oriented x 3. Normal mood.     Labs on Admission: I have personally reviewed following labs and imaging studies  CBC:  Recent Labs Lab 11/18/16 0429 11/24/16 1038  WBC 11.0* 17.1*  HGB 12.7* 14.0  HCT 38.4* 41.0  MCV 88.3 88.4  PLT 209 867    Basic Metabolic Panel:  Recent Labs Lab 11/18/16 0429 11/24/16 1038  NA 138 135  K 3.2* 3.9  CL 104 102  CO2 23 28  GLUCOSE 86 111*  BUN <5* 7  CREATININE 0.70 0.81  CALCIUM 8.7* 9.2  MG 1.7  --     GFR: Estimated Creatinine Clearance: 78.1 mL/min (by C-G formula based on SCr of 0.81 mg/dL).  Liver Function Tests:  Recent Labs Lab 11/24/16 1038  AST 20  ALT 14*  ALKPHOS 65  BILITOT 0.6  PROT 7.9  ALBUMIN 3.1*    Recent Labs Lab 11/24/16 1038  LIPASE 26   No results for input(s): AMMONIA in the last 168 hours.  Coagulation Profile: No results for input(s): INR, PROTIME in the  last 168 hours.  Cardiac Enzymes: No results for input(s): CKTOTAL, CKMB, CKMBINDEX, TROPONINI in the last 168 hours.  BNP (last 3 results) No results for input(s): PROBNP in the last 8760 hours.  HbA1C: No results for input(s): HGBA1C in the last 72 hours.  CBG: No results for input(s): GLUCAP in the last 168 hours.  Lipid Profile: No results for input(s): CHOL, HDL, LDLCALC, TRIG, CHOLHDL, LDLDIRECT in the last 72 hours.  Thyroid Function Tests: No results for input(s): TSH, T4TOTAL, FREET4, T3FREE, THYROIDAB in the last 72 hours.  Anemia Panel: No results for input(s): VITAMINB12, FOLATE, FERRITIN, TIBC, IRON, RETICCTPCT in the last 72 hours.  Urine analysis:    Component Value Date/Time   COLORURINE YELLOW 11/24/2016 1038   APPEARANCEUR TURBID (A) 11/24/2016 1038   LABSPEC 1.025 11/24/2016 1038   PHURINE 5.0 11/24/2016 1038   GLUCOSEU NEGATIVE 11/24/2016 1038   GLUCOSEU NEGATIVE 04/10/2015 1220   HGBUR MODERATE (A) 11/24/2016 1038   BILIRUBINUR NEGATIVE 11/24/2016 1038   KETONESUR NEGATIVE 11/24/2016 1038   PROTEINUR 100 (A) 11/24/2016 1038   UROBILINOGEN 0.2 04/10/2015 1220   NITRITE NEGATIVE 11/24/2016 1038   LEUKOCYTESUR NEGATIVE 11/24/2016 1038    Sepsis Labs: @LABRCNTIP (procalcitonin:4,lacticidven:4) ) Recent Results (from the past 240 hour(s))  Culture, blood (Routine X 2) w Reflex to ID Panel     Status: None   Collection Time: 11/15/16  5:15 PM  Result Value Ref Range Status   Specimen Description BLOOD RIGHT HAND  Final   Special Requests   Final    BOTTLES DRAWN AEROBIC AND ANAEROBIC Blood Culture adequate volume   Culture NO GROWTH 5 DAYS  Final   Report Status 11/20/2016 FINAL  Final  Culture, blood (Routine X 2) w Reflex to ID Panel     Status: None   Collection Time: 11/15/16  5:25 PM  Result Value Ref Range Status   Specimen Description BLOOD LEFT HAND  Final   Special Requests   Final    BOTTLES DRAWN AEROBIC AND ANAEROBIC Blood Culture  adequate volume   Culture NO GROWTH 5 DAYS  Final   Report Status 11/20/2016 FINAL  Final     Radiological Exams on Admission: Ct Abdomen Pelvis W Contrast  Result Date: 11/24/2016 CLINICAL DATA:  71 year old male with abdominal and pelvic pain for 1 week. EXAM: CT ABDOMEN AND PELVIS WITH CONTRAST TECHNIQUE: Multidetector CT imaging of the abdomen and pelvis was performed using the standard protocol following bolus administration of intravenous contrast. CONTRAST:  117mL ISOVUE-300 IOPAMIDOL (ISOVUE-300) INJECTION 61% COMPARISON:  11/15/2016 and prior CTs FINDINGS: Lower chest: No acute abnormality. Hepatobiliary: The liver and gallbladder are unremarkable. No biliary dilatation. Pancreas: Unremarkable Spleen: Unremarkable Adrenals/Urinary Tract: A 3.1 x 4 cm low-density right adrenal mass is unchanged from 2005-benign. Small bilateral renal cysts are present. There is no evidence of hydronephrosis. The visualized portions of the bladder are unremarkable. Stomach/Bowel: New 5.5 x 7.6 x 5 cm ill-defined phlegmonous change with foci of free air noted adjacent to the sigmoid colon at this site of recent acute diverticulitis. No discrete abscess is identified. There is no evidence of bowel obstruction. The appendix is normal. Vascular/Lymphatic: A 5.5 cm infrarenal suprailiac abdominal aortic aneurysm is again noted with unchanged 2.2 cm right common iliac and 2 cm left common iliac artery aneurysms. No enlarged lymph nodes. Reproductive: Marked prostate enlargement again noted. Other: No ascites. Small bilateral inguinal hernias containing fat again identified. Musculoskeletal: No acute or does suspicious focal bony abnormality. Moderate to severe degenerative disc disease and spondylosis in the lower lumbar spine again noted. IMPRESSION: 1. Increasing inflammatory changes from recent acute sigmoid diverticulitis, now with new 5.5 x 7.6 x 5 cm ill-defined phlegmon with foci of free air adjacent to the sigmoid  colon. No discrete abscess identified. No evidence of bowel obstruction. 2. 5.5 cm infrarenal suprailiac abdominal aortic aneurysm, unchanged from 11/15/2016, but significantly increased from 2005. As noted previously, vascular surgery consultation recommended if not performed. 3. Common iliac artery aneurysms as described 4. Marked prostate enlargement and small bilateral inguinal hernias containing fat 5. Benign right adrenal mass/adenoma. Electronically Signed   By: Margarette Canada M.D.   On: 11/24/2016 15:03    EKG: Independently reviewed.  Assessment/Plan Active Problems:   Acute Sigmoid diverticulitis   HLD (hyperlipidemia)   Anxiety state   TOBACCO ABUSE   Cardiomyopathy, ischemic   Aneurysm of abdominal vessel (5.6 CM)   COPD, severe ([name])   HYPERGLYCEMIA   PSA, INCREASED   Chronic systolic heart failure ([place])   ICD (implantable cardioverter-defibrillator) in place   Essential hypertension     Sigmoid diverticulitis, non resolved from prior admission, worsening symptoms. .CT abdomen and pelvis  Increasing inflammatory changes from recent acute sigmoid diverticulitis, now with new 5.5 x 7.6 x 5 cm ill-defined phlegmon with foci of free air adjacent to the sigmoid colon. No discrete abscess identified. No evidence of bowel obstruction. Afebrile, normal lactic acid. WBC 17. Able to tolerate po.  no vomiting or diarrhea . Last blood cultures were negative  Admit to MedSurg. Gastric and bowel rest. Continue IV hydration with normal saline, gentle due to history of CHF  Cipro and Flagyl  Protonix 40 mg IV every 24 hours. Analgesics and antiemetics as needed. Surgical evaluation is pending. CBC in am    Chronic systolic  CHF  09/3417 EF 62-22 percent, mod to severely reduced systolic, Gr 1 DD. Weight 150. No acute decompensation   Continue  meds including Coreg, Lasix. Ace I .  monitor I/Os and daily weights prn 02 Will consider repeat echo if surgery needed   Tobacco abuse without  nicotine withdrawal/ COPD  -  Nicotine patch if needed, he declined at this time  -  Counseled  cessation Continue inhalers prn   History of AAA, now enlarging per last CT abdomen.  5.5 cm infrarenal suprailiac abdominal aortic aneurysm, unchanged from 11/15/2016, but significantly increased from 2005 Vascular surgery to operate once diverticulitis is resolved    Hypertension BP 132/72   Pulse 80  Continue home anti-hypertensive medications   Hyperlipidemia Continue home statins    DVT prophylaxis:  SCD for now, until surgery to decide on any procedures to be done as IP  Code Status:    Full  Family Communication:  Discussed with patient Disposition Plan: Expect patient to be discharged to home after condition improves, likley on Monday. Patient is able to take po  Consults called:    Surgery per EDP  Admission status: Tele Obs    Rondel Jumbo, PA-C [facility]   11/24/2016, 3:39 PM

## 2016-11-24 NOTE — Consult Note (Signed)
Reason for Consult:diverticulitis Referring Physician: Dr. Myrene Buddy  Benjamin Cook is an 71 y.o. male.  HPI: his is a 71 year old gentleman who was recently admitted with diverticulitis. He seemed to be improving and was discharged home on oral antibiotics however he returned today with diarrhea and worsening abdominal pain. A CT scan showed worsening of the sigmoid diverticulitis with worsening phlegmon and microperforation.  His past medical history 72 ischemic cardiomyopathy as well as aneurysms and COPD.  The pain is sharp and greatest in left lower quadrant. Again, he has been having diarrhea. He has no nausea or vomiting. He denies fever.  Past Medical History:  Diagnosis Date  . Adrenal mass ([place])    per pt this is remote (10 years) and benign by biopsy  . AICD (automatic cardioverter/defibrillator) present   . Anxiety   . CAD (coronary artery disease)    a. s/p PCI to LAD 2001 b. myoview 2014 high risk with scar LAD/RCA territory but no ischemia  . Cardiomyopathy, ischemic   . CHF (congestive heart failure) ([name])    class II/III  . COPD (chronic obstructive pulmonary disease) ([name])    smokes cigars but has quit cigarettes  . Flu 03/2015  . HTN (hypertension)   . Hyperlipidemia   . Paroxysmal atrial fibrillation ([name]) 03/2015   chads2vasc score is 4  . PSA (psoriatic arthritis) ([place])    increased  . PVD (peripheral vascular disease) ([place])     Past Surgical History:  Procedure Laterality Date  . cataract surgery    . CORONARY ANGIOPLASTY WITH STENT PLACEMENT  2001   a. PCI to LAD  . IMPLANTABLE CARDIOVERTER DEFIBRILLATOR (ICD) GENERATOR CHANGE N/A 03/18/2014   a. SJM Fortify ST DR ICD implanted by [facility] for primary prevention b. gen change 03/2014   . LEAD REVISION/REPAIR N/A 07/17/2016   Procedure: Lead Revision/Repair;  Surgeon: Evans Lance, MD;  Location: [facility];  Service: Cardiovascular;  Laterality: N/A;  . LEFT HEART CATHETERIZATION WITH  CORONARY ANGIOGRAM N/A 05/27/2014   Procedure: LEFT HEART CATHETERIZATION WITH CORONARY ANGIOGRAM;  Surgeon: Sherren Mocha, MD;  Location: [facility] CATH [facility];  Service: Cardiovascular;  Laterality: N/A;    Family History  Problem Relation Age of Onset  . Cirrhosis Mother        due to ETOH  . Cancer Neg Hx     Social History:  reports that he has been smoking Cigars.  He has never used smokeless tobacco. He reports that he does not drink alcohol or use drugs.  Allergies: No Known Allergies  Medications: I have reviewed the patient's current medications.  Results for orders placed or performed during the hospital encounter of 11/24/16 (from the past 48 hour(s))  Lipase, blood     Status: None   Collection Time: 11/24/16 10:38 AM  Result Value Ref Range   Lipase 26 11 - 51 U/L  Comprehensive metabolic panel     Status: Abnormal   Collection Time: 11/24/16 10:38 AM  Result Value Ref Range   Sodium 135 135 - 145 mmol/L   Potassium 3.9 3.5 - 5.1 mmol/L   Chloride 102 101 - 111 mmol/L   CO2 28 22 - 32 mmol/L   Glucose, Bld 111 (H) 65 - 99 mg/dL   BUN 7 6 - 20 mg/dL   Creatinine, Ser 0.81 0.61 - 1.24 mg/dL   Calcium 9.2 8.9 - 10.3 mg/dL   Total Protein 7.9 6.5 - 8.1 g/dL   Albumin 3.1 (L) 3.5 -  5.0 g/dL   AST 20 15 - 41 U/L   ALT 14 (L) 17 - 63 U/L   Alkaline Phosphatase 65 38 - 126 U/L   Total Bilirubin 0.6 0.3 - 1.2 mg/dL   GFR calc non Af Amer >60 >60 mL/min   GFR calc Af Amer >60 >60 mL/min    Comment: (NOTE) The eGFR has been calculated using the CKD EPI equation. This calculation has not been validated in all clinical situations. eGFR's persistently <60 mL/min signify possible Chronic Kidney Disease.    Anion gap 5 5 - 15  CBC     Status: Abnormal   Collection Time: 11/24/16 10:38 AM  Result Value Ref Range   WBC 17.1 (H) 4.0 - 10.5 K/uL   RBC 4.64 4.22 - 5.81 MIL/uL   Hemoglobin 14.0 13.0 - 17.0 g/dL   HCT 41.0 39.0 - 52.0 %   MCV 88.4 78.0 - 100.0 fL   MCH 30.2 26.0  - 34.0 pg   MCHC 34.1 30.0 - 36.0 g/dL   RDW 14.5 11.5 - 15.5 %   Platelets 300 150 - 400 K/uL  Urinalysis, Routine w reflex microscopic     Status: Abnormal   Collection Time: 11/24/16 10:38 AM  Result Value Ref Range   Color, Urine YELLOW YELLOW   APPearance TURBID (A) CLEAR   Specific Gravity, Urine 1.025 1.005 - 1.030   pH 5.0 5.0 - 8.0   Glucose, UA NEGATIVE NEGATIVE mg/dL   Hgb urine dipstick MODERATE (A) NEGATIVE   Bilirubin Urine NEGATIVE NEGATIVE   Ketones, ur NEGATIVE NEGATIVE mg/dL   Protein, ur 100 (A) NEGATIVE mg/dL   Nitrite NEGATIVE NEGATIVE   Leukocytes, UA NEGATIVE NEGATIVE   RBC / HPF NONE SEEN 0 - 5 RBC/hpf   WBC, UA 0-5 0 - 5 WBC/hpf   Bacteria, UA RARE (A) NONE SEEN   Squamous Epithelial / LPF NONE SEEN NONE SEEN   Mucus PRESENT    Amorphous Crystal PRESENT   I-Stat CG4 Lactic Acid, ED     Status: None   Collection Time: 11/24/16 10:51 AM  Result Value Ref Range   Lactic Acid, Venous 0.93 0.5 - 1.9 mmol/L    Ct Abdomen Pelvis W Contrast  Result Date: 11/24/2016 CLINICAL DATA:  71 year old male with abdominal and pelvic pain for 1 week. EXAM: CT ABDOMEN AND PELVIS WITH CONTRAST TECHNIQUE: Multidetector CT imaging of the abdomen and pelvis was performed using the standard protocol following bolus administration of intravenous contrast. CONTRAST:  110m ISOVUE-300 IOPAMIDOL (ISOVUE-300) INJECTION 61% COMPARISON:  11/15/2016 and prior CTs FINDINGS: Lower chest: No acute abnormality. Hepatobiliary: The liver and gallbladder are unremarkable. No biliary dilatation. Pancreas: Unremarkable Spleen: Unremarkable Adrenals/Urinary Tract: A 3.1 x 4 cm low-density right adrenal mass is unchanged from 2005-benign. Small bilateral renal cysts are present. There is no evidence of hydronephrosis. The visualized portions of the bladder are unremarkable. Stomach/Bowel: New 5.5 x 7.6 x 5 cm ill-defined phlegmonous change with foci of free air noted adjacent to the sigmoid colon at  this site of recent acute diverticulitis. No discrete abscess is identified. There is no evidence of bowel obstruction. The appendix is normal. Vascular/Lymphatic: A 5.5 cm infrarenal suprailiac abdominal aortic aneurysm is again noted with unchanged 2.2 cm right common iliac and 2 cm left common iliac artery aneurysms. No enlarged lymph nodes. Reproductive: Marked prostate enlargement again noted. Other: No ascites. Small bilateral inguinal hernias containing fat again identified. Musculoskeletal: No acute or does suspicious focal bony abnormality.  Moderate to severe degenerative disc disease and spondylosis in the lower lumbar spine again noted. IMPRESSION: 1. Increasing inflammatory changes from recent acute sigmoid diverticulitis, now with new 5.5 x 7.6 x 5 cm ill-defined phlegmon with foci of free air adjacent to the sigmoid colon. No discrete abscess identified. No evidence of bowel obstruction. 2. 5.5 cm infrarenal suprailiac abdominal aortic aneurysm, unchanged from 11/15/2016, but significantly increased from 2005. As noted previously, vascular surgery consultation recommended if not performed. 3. Common iliac artery aneurysms as described 4. Marked prostate enlargement and small bilateral inguinal hernias containing fat 5. Benign right adrenal mass/adenoma. Electronically Signed   By: Margarette Canada M.D.   On: 11/24/2016 15:03    Review of Systems  Constitutional: Negative for chills and fever.  Respiratory: Negative for shortness of breath.   Cardiovascular: Negative for chest pain.  Gastrointestinal: Positive for abdominal pain and diarrhea. Negative for nausea and vomiting.  Genitourinary: Negative for dysuria.   Blood pressure 132/72, pulse 80, temperature 97.8 F (36.6 C), resp. rate 18, SpO2 96 %. Physical Exam  Constitutional: He is oriented to person, place, and time. He appears well-developed and well-nourished. He appears distressed.  He does appear slightly uncomfortable  HENT:   Head: Normocephalic and atraumatic.  Right Ear: External ear normal.  Left Ear: External ear normal.  Nose: Nose normal.  Eyes: Pupils are equal, round, and reactive to light. Conjunctivae are normal. No scleral icterus.  Neck: Normal range of motion. No tracheal deviation present.  Cardiovascular: Normal rate, regular rhythm and normal heart sounds.   Respiratory: Effort normal and breath sounds normal. No respiratory distress.  GI: Soft. There is tenderness. There is guarding.  there is significant tenderness with guarding in the left lower quadrant  Musculoskeletal: Normal range of motion. He exhibits no edema.  Lymphadenopathy:    He has no cervical adenopathy.  Neurological: He is alert and oriented to person, place, and time.  Skin: Skin is warm and dry. No rash noted. He is not diaphoretic. No erythema.  Psychiatric: His behavior is normal. Judgment normal.    Assessment/Plan: Sigmoid diverticulitis with microperforation  Plan at this point will be to continue continue to try conservative management with intravenous antibiotics. We will discuss the CT scan tomorrow with interventional radiology to see if there is anyway and drain can be placed. If he does not improve, or if his condition worsens, he will require exploratory laparotomy with resection and colostomy.  I discussed this with the patient and his wife in detail. Agree with plans. He will remain at bowel rest  [place] 11/24/2016, 5:51 PM

## 2016-11-24 NOTE — Progress Notes (Signed)
Pharmacy Antibiotic Note  Benjamin Cook is a 71 y.o. male admitted on 11/24/2016 with diverticultis.  Pharmacy has been consulted for zosyn dosing. Renal function wnl.  Plan: 1) Zosyn 3.375g IV q8 (4 hour infusion) 2) Follow renal function, cultures, LOT     Temp (24hrs), Avg:97.8 F (36.6 C), Min:97.8 F (36.6 C), Max:97.8 F (36.6 C)   Recent Labs Lab 11/18/16 0429 11/24/16 1038 11/24/16 1051  WBC 11.0* 17.1*  --   CREATININE 0.70 0.81  --   LATICACIDVEN  --   --  0.93    Estimated Creatinine Clearance: 78.1 mL/min (by C-G formula based on SCr of 0.81 mg/dL).    No Known Allergies  Antimicrobials this admission: 10/21 Zosyn >>  Dose adjustments this admission: n/a  Microbiology results: n/a  Thank you for allowing pharmacy to be a part of this patient's care.  Deboraha Sprang 11/24/2016 3:14 PM

## 2016-11-25 DIAGNOSIS — I255 Ischemic cardiomyopathy: Secondary | ICD-10-CM | POA: Diagnosis not present

## 2016-11-25 DIAGNOSIS — I739 Peripheral vascular disease, unspecified: Secondary | ICD-10-CM | POA: Diagnosis present

## 2016-11-25 DIAGNOSIS — F17290 Nicotine dependence, other tobacco product, uncomplicated: Secondary | ICD-10-CM | POA: Diagnosis present

## 2016-11-25 DIAGNOSIS — I714 Abdominal aortic aneurysm, without rupture: Secondary | ICD-10-CM | POA: Diagnosis not present

## 2016-11-25 DIAGNOSIS — I2510 Atherosclerotic heart disease of native coronary artery without angina pectoris: Secondary | ICD-10-CM | POA: Diagnosis present

## 2016-11-25 DIAGNOSIS — J449 Chronic obstructive pulmonary disease, unspecified: Secondary | ICD-10-CM | POA: Diagnosis not present

## 2016-11-25 DIAGNOSIS — L4050 Arthropathic psoriasis, unspecified: Secondary | ICD-10-CM | POA: Diagnosis present

## 2016-11-25 DIAGNOSIS — N400 Enlarged prostate without lower urinary tract symptoms: Secondary | ICD-10-CM | POA: Diagnosis present

## 2016-11-25 DIAGNOSIS — I723 Aneurysm of iliac artery: Secondary | ICD-10-CM | POA: Diagnosis present

## 2016-11-25 DIAGNOSIS — E785 Hyperlipidemia, unspecified: Secondary | ICD-10-CM | POA: Diagnosis present

## 2016-11-25 DIAGNOSIS — I5042 Chronic combined systolic (congestive) and diastolic (congestive) heart failure: Secondary | ICD-10-CM | POA: Diagnosis not present

## 2016-11-25 DIAGNOSIS — I5022 Chronic systolic (congestive) heart failure: Secondary | ICD-10-CM

## 2016-11-25 DIAGNOSIS — K5792 Diverticulitis of intestine, part unspecified, without perforation or abscess without bleeding: Secondary | ICD-10-CM | POA: Diagnosis not present

## 2016-11-25 DIAGNOSIS — E876 Hypokalemia: Secondary | ICD-10-CM | POA: Diagnosis present

## 2016-11-25 DIAGNOSIS — I472 Ventricular tachycardia: Secondary | ICD-10-CM | POA: Diagnosis not present

## 2016-11-25 DIAGNOSIS — K5720 Diverticulitis of large intestine with perforation and abscess without bleeding: Secondary | ICD-10-CM | POA: Diagnosis not present

## 2016-11-25 DIAGNOSIS — Z95810 Presence of automatic (implantable) cardiac defibrillator: Secondary | ICD-10-CM | POA: Diagnosis not present

## 2016-11-25 DIAGNOSIS — I252 Old myocardial infarction: Secondary | ICD-10-CM | POA: Diagnosis not present

## 2016-11-25 DIAGNOSIS — I110 Hypertensive heart disease with heart failure: Secondary | ICD-10-CM | POA: Diagnosis present

## 2016-11-25 DIAGNOSIS — I480 Paroxysmal atrial fibrillation: Secondary | ICD-10-CM | POA: Diagnosis present

## 2016-11-25 DIAGNOSIS — K5732 Diverticulitis of large intestine without perforation or abscess without bleeding: Secondary | ICD-10-CM | POA: Diagnosis not present

## 2016-11-25 DIAGNOSIS — K4020 Bilateral inguinal hernia, without obstruction or gangrene, not specified as recurrent: Secondary | ICD-10-CM | POA: Diagnosis present

## 2016-11-25 DIAGNOSIS — F411 Generalized anxiety disorder: Secondary | ICD-10-CM | POA: Diagnosis present

## 2016-11-25 DIAGNOSIS — Z7901 Long term (current) use of anticoagulants: Secondary | ICD-10-CM | POA: Diagnosis not present

## 2016-11-25 DIAGNOSIS — E279 Disorder of adrenal gland, unspecified: Secondary | ICD-10-CM | POA: Diagnosis present

## 2016-11-25 DIAGNOSIS — I10 Essential (primary) hypertension: Secondary | ICD-10-CM | POA: Diagnosis not present

## 2016-11-25 DIAGNOSIS — R739 Hyperglycemia, unspecified: Secondary | ICD-10-CM | POA: Diagnosis present

## 2016-11-25 DIAGNOSIS — F17200 Nicotine dependence, unspecified, uncomplicated: Secondary | ICD-10-CM | POA: Diagnosis not present

## 2016-11-25 LAB — PROTIME-INR
INR: 2.11
PROTHROMBIN TIME: 23.5 s — AB (ref 11.4–15.2)

## 2016-11-25 LAB — COMPREHENSIVE METABOLIC PANEL
ALBUMIN: 2.7 g/dL — AB (ref 3.5–5.0)
ALK PHOS: 53 U/L (ref 38–126)
ALT: 12 U/L — AB (ref 17–63)
AST: 15 U/L (ref 15–41)
Anion gap: 8 (ref 5–15)
BILIRUBIN TOTAL: 0.8 mg/dL (ref 0.3–1.2)
BUN: 7 mg/dL (ref 6–20)
CALCIUM: 8.5 mg/dL — AB (ref 8.9–10.3)
CO2: 23 mmol/L (ref 22–32)
CREATININE: 0.79 mg/dL (ref 0.61–1.24)
Chloride: 103 mmol/L (ref 101–111)
GFR calc Af Amer: >60 mL/min (ref >60)
GFR calc non Af Amer: >60 mL/min (ref >60)
GLUCOSE: 118 mg/dL — AB (ref 65–99)
Potassium: 3.8 mmol/L (ref 3.5–5.1)
SODIUM: 134 mmol/L — AB (ref 135–145)
Total Protein: 6.7 g/dL (ref 6.5–8.1)

## 2016-11-25 LAB — CBC
HCT: 37.4 % — ABNORMAL LOW (ref 39.0–52.0)
Hemoglobin: 12.2 g/dL — ABNORMAL LOW (ref 13.0–17.0)
MCH: 29.1 pg (ref 26.0–34.0)
MCHC: 32.6 g/dL (ref 30.0–36.0)
MCV: 89.3 fL (ref 78.0–100.0)
PLATELETS: 269 10*3/uL (ref 150–400)
RBC: 4.19 MIL/uL — ABNORMAL LOW (ref 4.22–5.81)
RDW: 14.9 % (ref 11.5–15.5)
WBC: 16.9 10*3/uL — ABNORMAL HIGH (ref 4.0–10.5)

## 2016-11-25 MED ORDER — PIPERACILLIN-TAZOBACTAM 3.375 G IVPB
3.3750 g | Freq: Three times a day (TID) | INTRAVENOUS | Status: DC
  Administered 2016-11-25 – 2016-11-29 (×13): 3.375 g via INTRAVENOUS
  Filled 2016-11-25 (×14): qty 50

## 2016-11-25 NOTE — Consult Note (Signed)
Cardiology Consultation:   Patient ID: Benjamin Cook; [id number]; [birth year]   Admit date: 11/24/2016 Date of Consult: 11/25/2016  Primary Care Provider: Renato Shin, MD Primary Cardiologist: Dr. [name] Primary Electrophysiologist:  Dr. Rayann Heman    Patient Profile:   Benjamin Cook is a 71 y.o. male with a hx of CAD, chronic systolic and diastolic heart failure, AAA, COPD, paroxysmal atrial fibrillation, tobacco abuse and diverticulitis who is being seen today for the evaluation of pre-surgical risk assessment at the request of Dr. Coralie Keens.  History of Present Illness:   Benjamin Cook was recently admitted 10/13-10/15 with diverticulitis and was discharged home on oral antibiotics.  However, he never filled the prescription for Augmentin.  He returned 10/21 with increased abdominal pain and diarrhea.  CT scan showed worsening sigmoid diverticulitis with worsening phlegmon and microperforation.  He was also noted to have a 5.5 cm infrarenal suprailiac AAA.  This was increased from 3.1cm 10/2003.  He was evaluated by Dr. Ninfa Linden who planned to proceed with conservative management with IV antibiotics or possible drain placement.  However it was felt that drain placement would not be possible.  He will either require IV antibiotics or exploratory laparotomy with resection and colostomy.  Prior to this episode of diverticulitis he had been in his baseline state of health.  He has chronic dyspnea on exertion and continues to smoke black and mild cigarettes.  He has no issues with chest pain, lower extremity edema, orthopnea, or PND.  He denies palpitations, lightheadedness, or dizziness.  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  He does not think he can walk 4 blocks but is able to walk 1 flight of stairs without stopping or developing chest pain.  Benjamin Cook had an MI in 2001.  Cardiac catheterization at that time  revealed 95% mid LAD and 70% diffuse RCA disease.  LVEF was 25-30%.  He underwent PCI of the LAD.  Lexiscan Myoview in 2014 showed LAD/RCA infarct but no ischemia.  He had a subsequent cardiac catheterization 05/2014 that revealed diffuse, non-obstructive coronary artery disease.  His most recent echo 03/2015 revealed LVEF 30-35% with akinesis of the anteroseptal and apical myocardium and grade 1 diastolic dysfunction.   Past Medical History:  Diagnosis Date  . Adrenal mass ([place])    per pt this is remote (10 years) and benign by biopsy  . AICD (automatic cardioverter/defibrillator) present   . Anxiety   . CAD (coronary artery disease)    a. s/p PCI to LAD 2001 b. myoview 2014 high risk with scar LAD/RCA territory but no ischemia  . Cardiomyopathy, ischemic   . CHF (congestive heart failure) ([name])    class II/III  . COPD (chronic obstructive pulmonary disease) ([name])    smokes cigars but has quit cigarettes  . Flu 03/2015  . HTN (hypertension)   . Hyperlipidemia   . Paroxysmal atrial fibrillation ([name]) 03/2015   chads2vasc score is 4  . PSA (psoriatic arthritis) ([place])    increased  . PVD (peripheral vascular disease) ([place])     Past Surgical History:  Procedure Laterality Date  . cataract surgery    . CORONARY ANGIOPLASTY WITH STENT PLACEMENT  2001   a. PCI to LAD  . IMPLANTABLE CARDIOVERTER DEFIBRILLATOR (ICD) GENERATOR CHANGE N/A 03/18/2014   a. SJM Fortify ST DR ICD implanted by [facility] for primary prevention b. gen change 03/2014   . LEAD REVISION/REPAIR N/A 07/17/2016  Procedure: Lead Revision/Repair;  Surgeon: Evans Lance, MD;  Location: [facility];  Service: Cardiovascular;  Laterality: N/A;  . LEFT HEART CATHETERIZATION WITH CORONARY ANGIOGRAM N/A 05/27/2014   Procedure: LEFT HEART CATHETERIZATION WITH CORONARY ANGIOGRAM;  Surgeon: Sherren Mocha, MD;  Location: [facility];  Service: Cardiovascular;  Laterality: N/A;     Home Medications:  Prior to Admission medications    Medication Sig Start Date End Date Taking? Authorizing Provider  atorvastatin (LIPITOR) 80 MG tablet TAKE 1 TABLET BY MOUTH  DAILY AT 6 PM. Patient taking differently: TAKE 80mg  BY MOUTH  DAILY AT 6 PM. 10/23/16  Yes Allred, Jeneen Rinks, MD  carvedilol (COREG) 25 MG tablet TAKE 1 TABLET BY MOUTH TWO  TIMES DAILY WITH A MEAL Patient taking differently: TAKE 25mg  BY MOUTH TWO  TIMES DAILY WITH A MEAL 10/23/16  Yes Allred, Jeneen Rinks, MD  Fluticasone-Salmeterol (ADVAIR) 100-50 MCG/DOSE AEPB Inhale 1 puff into the lungs 2 (two) times daily. 04/10/15  Yes Renato Shin, MD  furosemide (LASIX) 40 MG tablet Take 0.5 tablets (20 mg total) by mouth daily. 11/18/16  Yes Lavina Hamman, MD  Ibuprofen-Diphenhydramine Cit (ADVIL PM PO) Take 1 tablet by mouth daily as needed (AT BEDTIME FOR SLEEP).   Yes [provider]  lisinopril (PRINIVIL,ZESTRIL) 20 MG tablet TAKE 1 TABLET BY MOUTH  EVERY DAY Patient taking differently: TAKE 20mg   BY MOUTH  EVERY DAY 10/23/16  Yes Allred, Jeneen Rinks, MD  liver oil-zinc oxide (DESITIN) 40 % ointment Apply 1 application topically as needed for irritation.   Yes [provider]  Multiple Vitamin (MULTIVITAMIN) capsule Take 1 capsule by mouth daily.     Yes [provider]  potassium chloride SA (K-DUR,KLOR-CON) 20 MEQ tablet Take 2 tablets (40 mEq total) by mouth daily. 11/18/16  Yes Lavina Hamman, MD  rivaroxaban (XARELTO) 20 MG TABS tablet Take 1 tablet (20 mg total) by mouth daily with supper. 10/09/16  Yes Allred, Jeneen Rinks, MD  witch hazel-glycerin (TUCKS) pad Apply 1 application topically as needed for itching or irritation.   Yes [provider]    Inpatient Medications: Scheduled Meds: . atorvastatin  80 mg Oral q1800  . carvedilol  25 mg Oral BID WC  . furosemide  20 mg Oral Daily  . lisinopril  20 mg Oral Daily  . mometasone-formoterol  2 puff Inhalation BID  . pantoprazole (PROTONIX) IV  40 mg Intravenous QHS  . potassium chloride SA  40 mEq Oral  Daily   Continuous Infusions: . sodium chloride 50 mL/hr at 11/25/16 1512  . piperacillin-tazobactam (ZOSYN)  IV 3.375 g (11/25/16 1510)   PRN Meds: acetaminophen **OR** acetaminophen, bisacodyl, HYDROcodone-acetaminophen, ondansetron **OR** ondansetron (ZOFRAN) IV, senna-docusate, witch hazel-glycerin  Allergies:   No Known Allergies  Social History:   Social History   Social History  . Marital status: Married    Spouse name: N/A  . Number of children: N/A  . Years of education: N/A   Occupational History  . Lawn care    Social History Main Topics  . Smoking status: Current Every Day Smoker    Types: Cigars  . Smokeless tobacco: Never Used     Comment: smokes cigars now, no longer smokes cigarettes but previously smoked 2ppd  . Alcohol use No  . Drug use: No  . Sexual activity: Not Currently    Partners: Female    Birth control/ protection: None   Other Topics Concern  . Not on file   Social History  Narrative  . No narrative on file    Family History:   Family History  Problem Relation Age of Onset  . Cirrhosis Mother        due to ETOH  . Cancer Neg Hx      ROS:  Please see the history of present illness.  ROS  All other ROS reviewed and negative.     Physical Exam/Data:   Vitals:   11/25/16 0540 11/25/16 0859 11/25/16 1011 11/25/16 1451  BP: (!) 114/46  137/72 137/60  Pulse: 71   66  Resp: 18   18  Temp: 98.4 F (36.9 C)   98.4 F (36.9 C)  TempSrc: Oral   Oral  SpO2: 92% 91%  92%  Weight: 68.1 kg (150 lb 3.2 oz)     Height:        Intake/Output Summary (Last 24 hours) at 11/25/16 1532 Last data filed at 11/25/16 0604  Gross per 24 hour  Intake             1950 ml  Output                0 ml  Net             1950 ml   Filed Weights   11/24/16 1808 11/25/16 0540  Weight: 67.2 kg (148 lb 1.6 oz) 68.1 kg (150 lb 3.2 oz)   Body mass index is 20.95 kg/m.  VS:  BP 137/60 (BP Location: Left Arm)   Pulse 66   Temp 98.4 F (36.9 C) (Oral)    Resp 18   Ht 5\' 11"  (1.803 m)   Wt 68.1 kg (150 lb 3.2 oz)   SpO2 92%   BMI 20.95 kg/m  , BMI Body mass index is 20.95 kg/m. GENERAL:  Well appearing.  No acute distress HEENT: Pupils equal round and reactive, fundi not visualized, oral mucosa unremarkable.  Edentulous NECK:  No jugular venous distention, waveform within normal limits, carotid upstroke brisk and symmetric, no bruits, no thyromegaly LUNGS: Scattered rhonchi.  No crackles, wheezes, or rubs. HEART:  RRR.  PMI not displaced or sustained,S1 and S2 within normal limits, no S3, no S4, no clicks, no rubs, II/VI systolic murmur at the LUSB ABD:  Flat, positive bowel sounds normal in frequency in pitch, no bruits, no rebound, no guarding.  Mild LLE TTP. No midline pulsatile mass, no hepatomegaly, no splenomegaly EXT:  2 plus pulses throughout, no edema, no cyanosis no clubbing SKIN:  No rashes no nodules NEURO:  Cranial nerves II through XII grossly intact, motor grossly intact throughout PSYCH:  Cognitively intact, oriented to person place and time   EKG:  The EKG was personally reviewed and demonstrates:  pending Telemetry:  Telemetry was personally reviewed and demonstrates: Sinus rhythm.  Occasional atrial pacing.  Occasional ventricular pacing.  PVCs.  Relevant CV Studies:  Echo 03/29/15: Study Conclusions  - Left ventricle: The cavity size was normal. Wall thickness was   increased in a pattern of mild LVH. Systolic function was   moderately to severely reduced. The estimated ejection fraction   was in the range of 30% to 35%. There is akinesis of the   anteroseptal and apical myocardium. Doppler parameters are   consistent with abnormal left ventricular relaxation (grade 1   diastolic dysfunction). - Left atrium: The atrium was mildly dilated.  Impressions:  - Akinesis of the anteroseptal and apical walls with moderate to   severe LV dysfunction; grade 1 diastolic  dysfunction; mild LAE;   trace MR and TR; no  LV apical thrombus noted using definity.  Laboratory Data:  Chemistry Recent Labs Lab 11/24/16 1038 11/25/16 0522  NA 135 134*  K 3.9 3.8  CL 102 103  CO2 28 23  GLUCOSE 111* 118*  BUN 7 7  CREATININE 0.81 0.79  CALCIUM 9.2 8.5*  GFRNONAA >60 >60  GFRAA >60 >60  ANIONGAP 5 8     Recent Labs Lab 11/24/16 1038 11/25/16 0522  PROT 7.9 6.7  ALBUMIN 3.1* 2.7*  AST 20 15  ALT 14* 12*  ALKPHOS 65 53  BILITOT 0.6 0.8   Hematology Recent Labs Lab 11/24/16 1038 11/25/16 0522  WBC 17.1* 16.9*  RBC 4.64 4.19*  HGB 14.0 12.2*  HCT 41.0 37.4*  MCV 88.4 89.3  MCH 30.2 29.1  MCHC 34.1 32.6  RDW 14.5 14.9  PLT 300 269   Cardiac EnzymesNo results for input(s): TROPONINI in the last 168 hours. No results for input(s): TROPIPOC in the last 168 hours.  BNPNo results for input(s): BNP, PROBNP in the last 168 hours.  DDimer No results for input(s): DDIMER in the last 168 hours.  Radiology/Studies:  Ct Abdomen Pelvis W Contrast  Result Date: 11/24/2016 CLINICAL DATA:  71 year old male with abdominal and pelvic pain for 1 week. EXAM: CT ABDOMEN AND PELVIS WITH CONTRAST TECHNIQUE: Multidetector CT imaging of the abdomen and pelvis was performed using the standard protocol following bolus administration of intravenous contrast. CONTRAST:  176mL ISOVUE-300 IOPAMIDOL (ISOVUE-300) INJECTION 61% COMPARISON:  11/15/2016 and prior CTs FINDINGS: Lower chest: No acute abnormality. Hepatobiliary: The liver and gallbladder are unremarkable. No biliary dilatation. Pancreas: Unremarkable Spleen: Unremarkable Adrenals/Urinary Tract: A 3.1 x 4 cm low-density right adrenal mass is unchanged from 2005-benign. Small bilateral renal cysts are present. There is no evidence of hydronephrosis. The visualized portions of the bladder are unremarkable. Stomach/Bowel: New 5.5 x 7.6 x 5 cm ill-defined phlegmonous change with foci of free air noted adjacent to the sigmoid colon at this site of recent acute  diverticulitis. No discrete abscess is identified. There is no evidence of bowel obstruction. The appendix is normal. Vascular/Lymphatic: A 5.5 cm infrarenal suprailiac abdominal aortic aneurysm is again noted with unchanged 2.2 cm right common iliac and 2 cm left common iliac artery aneurysms. No enlarged lymph nodes. Reproductive: Marked prostate enlargement again noted. Other: No ascites. Small bilateral inguinal hernias containing fat again identified. Musculoskeletal: No acute or does suspicious focal bony abnormality. Moderate to severe degenerative disc disease and spondylosis in the lower lumbar spine again noted. IMPRESSION: 1. Increasing inflammatory changes from recent acute sigmoid diverticulitis, now with new 5.5 x 7.6 x 5 cm ill-defined phlegmon with foci of free air adjacent to the sigmoid colon. No discrete abscess identified. No evidence of bowel obstruction. 2. 5.5 cm infrarenal suprailiac abdominal aortic aneurysm, unchanged from 11/15/2016, but significantly increased from 2005. As noted previously, vascular surgery consultation recommended if not performed. 3. Common iliac artery aneurysms as described 4. Marked prostate enlargement and small bilateral inguinal hernias containing fat 5. Benign right adrenal mass/adenoma. Electronically Signed   By: Margarette Canada M.D.   On: 11/24/2016 15:03    Assessment and Plan:   # Pre-surgical risk assessment: Benjamin Cook is at elevated risk of cardiac complications (NSQIP risk 2.2%, RCRI risk 6.6%).  His NSQIP risk of death is 5.7%.  This is mostly attributable to his severe COPD noted on PFTs 09/9379, chronic systolic and diastolic heart failure and known  ischemic cardiomyopathy.  Prior to this hospitalization he did not have any unstable cardiac symptoms.  His dyspnea on exertion is stable and mostly attributable to his lung disease.  Further testing would not lower these risk. Given his high risk, nonsurgical options would be optimal if possible.   However, his risk is not prohibitive.  I discussed these risks with Benjamin Cook and his wife.  They both expressed understanding.  # AAA: Moderate 5.1cm infrarenal, supra-iliac  AAA noted on CT, which has increased from 2005.  He will need outpatient Vascular Surgery follow up.  Smoking cessation advised.   # Chronic systolic and diastolic heart failure:  # S/p ICD: ICD last interrogated 10/28/16 and functioning properly.  RV lead was fractured and revised 07/2016.   # CAD s/p coronary stent: LAD PCI 2001.  # Hypertension:  # Hyperlipidemia: Blood pressure well-controlled on carvedilol and lisinopril.  Continue atorvastatin.  Aspirin when stable post-op.   # Tobacco abuse: Cessation advised.  He is not interested in using the patch.  We discussed smoking cessation for 5 minutes.  For questions or updates, please contact [place] Please consult [email or web address] for contact info under Cardiology/STEMI.   Signed, Skeet Latch, MD  11/25/2016 3:32 PM

## 2016-11-25 NOTE — Progress Notes (Signed)
Patient recently admitted with diverticulitis who has not improved at home with PO antibiotics.   CT Abdomen 11/24/16: 1. Increasing inflammatory changes from recent acute sigmoid diverticulitis, now with new 5.5 x 7.6 x 5 cm ill-defined phlegmon with foci of free air adjacent to the sigmoid colon. No discrete abscess identified. No evidence of bowel obstruction. 2. 5.5 cm infrarenal suprailiac abdominal aortic aneurysm, unchanged from 11/15/2016, but significantly increased from 2005. As noted previously, vascular surgery consultation recommended if not performed. 3. Common iliac artery aneurysms as described 4. Marked prostate enlargement and small bilateral inguinal hernias containing fat 5. Benign right adrenal mass/adenoma.  IR consulted by surgery for possible aspiration and drainage if abscess present.  Reviewed imaging with Dr. Kathlene Cote who notes no discrete abscess in area of markedly increased inflammation.  The sigmoid colon is present anterior to the area of inflammation.  No safe window for percutaneous drainage at this time.  IR available if able to demonstrate safe approach.  Brynda Greathouse, MS RD PA-C 10:30 AM

## 2016-11-25 NOTE — Progress Notes (Signed)
PROGRESS NOTE    Benjamin Cook  HYW:[id number] DOB: 02-22-1945 DOA: 11/24/2016 PCP: Renato Shin, MD   Outpatient Specialists:     Brief Narrative:  Benjamin Cook is a 71 y.o. male with medical history significant for ischemic cardiomyopathy, last known EF 35%, history ofabdominal aortic aneurysm, tobacco abuse, COPD, and recent admission for diverticulitis from 1013 through 11/18/2016 treated with IV fluids, IV Zosyn, discharged on Augmentin, which he never filled out. He states since discharge, the patient did not feeling well. He presents today with generalized lower quadrant tenderness, especially on the left, nausea without vomiting. He denies any diarrhea. He denies any food poisoning. He also denies eating any spicy foods, or seedy foods such as tomatoes or nuts.he denies any fever, chills or night sweats. He denies any chest pain or palpitations. He denies any shortness of breath or cough.he denies any lower extremity swelling. He continues to smoke, he denies any recreational drug use.   Assessment & Plan:   Active Problems:   HLD (hyperlipidemia)   Anxiety state   TOBACCO ABUSE   Cardiomyopathy, ischemic   Aneurysm of abdominal vessel (5.6 CM)   COPD, severe ([name])   HYPERGLYCEMIA   PSA, INCREASED   Chronic systolic heart failure ([name])   ICD (implantable cardioverter-defibrillator) in place   Essential hypertension   Acute Sigmoid diverticulitis   Diverticulitis   Adrenal mass ([place])   Sigmoid diverticulitis, non resolved from prior admission, worsening symptoms. .CT abdomen and pelvis shows increasing inflammatory changes from recent acute sigmoid diverticulitis, now with new 5.5 x 7.6 x 5 cm ill-defined phlegmon with foci of free air adjacent to the sigmoid colon. No discrete abscess identified. No evidence of bowel obstruction. bowel rest. Continue IV hydration with normal saline, gentle due to history of CHF  -zosyn Protonix 40 mg IV every 24  hours. Analgesics and antiemetics as needed. GS consult: trying to avoid surgery but will need cardiology eval to maximize medical therapy prior -cardiology consult  Chronic systolic  CHF  05/8544 EF 27-03 percent, mod to severely reduced systolic, Gr 1 DD. Weight 150. No acute decompensation    Tobacco abuse without nicotine withdrawal/ COPD  -  Nicotine patch if needed, he declined at this time  -  Counseled cessation Continue inhalers prn   History of AAA, now enlarging per last CT abdomen.  5.5 cm infrarenal suprailiac abdominal aortic aneurysm, unchanged from 11/15/2016, but significantly increased from 2005 Vascular surgery to operate once diverticulitis is resolved   Hypertension BP 132/72   Pulse 80  Continue home anti-hypertensive medications   Hyperlipidemia Continue home statins    DVT prophylaxis:   SCD's  Code Status:  Full Code   Family Communication: At bedside  Disposition Plan:     Consultants:   [place]  cardiology  Procedures:    Subjective: No SOB, no CP some abdominal discomfort finished course of abx- augmentin on Thursday/Friday  Objective: Vitals:   11/24/16 2104 11/25/16 0540 11/25/16 0859 11/25/16 1011  BP: (!) 122/59 (!) 114/46  137/72  Pulse: 70 71    Resp: 20 18    Temp: 98.4 F (36.9 C) 98.4 F (36.9 C)    TempSrc: Oral Oral    SpO2: 93% 92% 91%   Weight:  68.1 kg (150 lb 3.2 oz)    Height:        Intake/Output Summary (Last 24 hours) at 11/25/16 1156 Last data filed at 11/25/16 0604  Gross per 24 hour  Intake  1950 ml  Output                0 ml  Net             1950 ml   Filed Weights   11/24/16 1808 11/25/16 0540  Weight: 67.2 kg (148 lb 1.6 oz) 68.1 kg (150 lb 3.2 oz)    Examination:  General exam: Appears calm and comfortable  Respiratory system: Clear to auscultation. Respiratory effort normal. Cardiovascular system: S1 & S2 heard, RRR. No JVD, murmurs, rubs, gallops or clicks. No pedal  edema. Gastrointestinal system: Abdomen is non-distended- tender in left side Central nervous system: Alert and oriented. No focal neurological deficits. Extremities: Symmetric 5 x 5 power. Skin: No rashes, lesions or ulcers Psychiatry: Judgement and insight appear normal. Mood & affect appropriate.     Data Reviewed: I have personally reviewed following labs and imaging studies  CBC:  Recent Labs Lab 11/24/16 1038 11/25/16 0522  WBC 17.1* 16.9*  HGB 14.0 12.2*  HCT 41.0 37.4*  MCV 88.4 89.3  PLT 300 710   Basic Metabolic Panel:  Recent Labs Lab 11/24/16 1038 11/25/16 0522  NA 135 134*  K 3.9 3.8  CL 102 103  CO2 28 23  GLUCOSE 111* 118*  BUN 7 7  CREATININE 0.81 0.79  CALCIUM 9.2 8.5*   GFR: Estimated Creatinine Clearance: 79.2 mL/min (by C-G formula based on SCr of 0.79 mg/dL). Liver Function Tests:  Recent Labs Lab 11/24/16 1038 11/25/16 0522  AST 20 15  ALT 14* 12*  ALKPHOS 65 53  BILITOT 0.6 0.8  PROT 7.9 6.7  ALBUMIN 3.1* 2.7*    Recent Labs Lab 11/24/16 1038  LIPASE 26   No results for input(s): AMMONIA in the last 168 hours. Coagulation Profile:  Recent Labs Lab 11/25/16 0522  INR 2.11   Cardiac Enzymes: No results for input(s): CKTOTAL, CKMB, CKMBINDEX, TROPONINI in the last 168 hours. BNP (last 3 results) No results for input(s): PROBNP in the last 8760 hours. HbA1C: No results for input(s): HGBA1C in the last 72 hours. CBG: No results for input(s): GLUCAP in the last 168 hours. Lipid Profile: No results for input(s): CHOL, HDL, LDLCALC, TRIG, CHOLHDL, LDLDIRECT in the last 72 hours. Thyroid Function Tests: No results for input(s): TSH, T4TOTAL, FREET4, T3FREE, THYROIDAB in the last 72 hours. Anemia Panel: No results for input(s): VITAMINB12, FOLATE, FERRITIN, TIBC, IRON, RETICCTPCT in the last 72 hours. Urine analysis:    Component Value Date/Time   COLORURINE YELLOW 11/24/2016 1038   APPEARANCEUR TURBID (A) 11/24/2016  1038   LABSPEC 1.025 11/24/2016 1038   PHURINE 5.0 11/24/2016 1038   GLUCOSEU NEGATIVE 11/24/2016 1038   GLUCOSEU NEGATIVE 04/10/2015 1220   HGBUR MODERATE (A) 11/24/2016 1038   BILIRUBINUR NEGATIVE 11/24/2016 1038   KETONESUR NEGATIVE 11/24/2016 1038   PROTEINUR 100 (A) 11/24/2016 1038   UROBILINOGEN 0.2 04/10/2015 1220   NITRITE NEGATIVE 11/24/2016 [place] 11/24/2016 1038     ) Recent Results (from the past 240 hour(s))  Culture, blood (Routine X 2) w Reflex to ID Panel     Status: None   Collection Time: 11/15/16  5:15 PM  Result Value Ref Range Status   Specimen Description BLOOD RIGHT HAND  Final   Special Requests   Final    BOTTLES DRAWN AEROBIC AND ANAEROBIC Blood Culture adequate volume   Culture NO GROWTH 5 DAYS  Final   Report Status 11/20/2016 FINAL  Final  Culture,  blood (Routine X 2) w Reflex to ID Panel     Status: None   Collection Time: 11/15/16  5:25 PM  Result Value Ref Range Status   Specimen Description BLOOD LEFT HAND  Final   Special Requests   Final    BOTTLES DRAWN AEROBIC AND ANAEROBIC Blood Culture adequate volume   Culture NO GROWTH 5 DAYS  Final   Report Status 11/20/2016 FINAL  Final      Anti-infectives    Start     Dose/Rate Route Frequency Ordered Stop   11/25/16 1400  piperacillin-tazobactam (ZOSYN) IVPB 3.375 g     3.375 g 12.5 mL/hr over 240 Minutes Intravenous Every 8 hours 11/25/16 1045     11/24/16 2330  piperacillin-tazobactam (ZOSYN) IVPB 3.375 g  Status:  Discontinued     3.375 g 12.5 mL/hr over 240 Minutes Intravenous Every 8 hours 11/24/16 1511 11/24/16 1634   11/24/16 1700  metroNIDAZOLE (FLAGYL) IVPB 500 mg  Status:  Discontinued     500 mg 100 mL/hr over 60 Minutes Intravenous Every 8 hours 11/24/16 1631 11/25/16 0847   11/24/16 1700  ciprofloxacin (CIPRO) IVPB 400 mg  Status:  Discontinued     400 mg 200 mL/hr over 60 Minutes Intravenous Every 12 hours 11/24/16 1635 11/25/16 0847   11/24/16 1530   piperacillin-tazobactam (ZOSYN) IVPB 3.375 g     3.375 g 100 mL/hr over 30 Minutes Intravenous  Once 11/24/16 1511 11/24/16 1612       Radiology Studies: Ct Abdomen Pelvis W Contrast  Result Date: 11/24/2016 CLINICAL DATA:  71 year old male with abdominal and pelvic pain for 1 week. EXAM: CT ABDOMEN AND PELVIS WITH CONTRAST TECHNIQUE: Multidetector CT imaging of the abdomen and pelvis was performed using the standard protocol following bolus administration of intravenous contrast. CONTRAST:  176mL ISOVUE-300 IOPAMIDOL (ISOVUE-300) INJECTION 61% COMPARISON:  11/15/2016 and prior CTs FINDINGS: Lower chest: No acute abnormality. Hepatobiliary: The liver and gallbladder are unremarkable. No biliary dilatation. Pancreas: Unremarkable Spleen: Unremarkable Adrenals/Urinary Tract: A 3.1 x 4 cm low-density right adrenal mass is unchanged from 2005-benign. Small bilateral renal cysts are present. There is no evidence of hydronephrosis. The visualized portions of the bladder are unremarkable. Stomach/Bowel: New 5.5 x 7.6 x 5 cm ill-defined phlegmonous change with foci of free air noted adjacent to the sigmoid colon at this site of recent acute diverticulitis. No discrete abscess is identified. There is no evidence of bowel obstruction. The appendix is normal. Vascular/Lymphatic: A 5.5 cm infrarenal suprailiac abdominal aortic aneurysm is again noted with unchanged 2.2 cm right common iliac and 2 cm left common iliac artery aneurysms. No enlarged lymph nodes. Reproductive: Marked prostate enlargement again noted. Other: No ascites. Small bilateral inguinal hernias containing fat again identified. Musculoskeletal: No acute or does suspicious focal bony abnormality. Moderate to severe degenerative disc disease and spondylosis in the lower lumbar spine again noted. IMPRESSION: 1. Increasing inflammatory changes from recent acute sigmoid diverticulitis, now with new 5.5 x 7.6 x 5 cm ill-defined phlegmon with foci of free  air adjacent to the sigmoid colon. No discrete abscess identified. No evidence of bowel obstruction. 2. 5.5 cm infrarenal suprailiac abdominal aortic aneurysm, unchanged from 11/15/2016, but significantly increased from 2005. As noted previously, vascular surgery consultation recommended if not performed. 3. Common iliac artery aneurysms as described 4. Marked prostate enlargement and small bilateral inguinal hernias containing fat 5. Benign right adrenal mass/adenoma. Electronically Signed   By: Margarette Canada M.D.   On: 11/24/2016 15:03  Scheduled Meds: . atorvastatin  80 mg Oral q1800  . carvedilol  25 mg Oral BID WC  . furosemide  20 mg Oral Daily  . lisinopril  20 mg Oral Daily  . mometasone-formoterol  2 puff Inhalation BID  . pantoprazole (PROTONIX) IV  40 mg Intravenous QHS  . potassium chloride SA  40 mEq Oral Daily   Continuous Infusions: . sodium chloride 50 mL/hr at 11/24/16 1749  . piperacillin-tazobactam (ZOSYN)  IV       LOS: 0 days    Time spent: 35 min    Marcus, DO [facility] Pager [phone]  If 7PM-7AM, please contact night-coverage [email or web address] Password [email or web address] 11/25/2016, 11:56 AM

## 2016-11-25 NOTE — Progress Notes (Signed)
Pharmacy Antibiotic Note  Benjamin Cook is a 71 y.o. male admitted on 11/24/2016 with diverticultis.  Pharmacy has been consulted for zosyn dosing. Renal function wnl.  Plan: 1) Zosyn 3.375g IV q8 (4 hour infusion) 2) Follow renal function, cultures, LOT  Height: 5\' 11"  (180.3 cm) Weight: 150 lb 3.2 oz (68.1 kg) IBW/kg (Calculated) : 75.3  Temp (24hrs), Avg:98.6 F (37 C), Min:98.4 F (36.9 C), Max:98.9 F (37.2 C)   Recent Labs Lab 11/24/16 1038 11/24/16 1051 11/25/16 0522  WBC 17.1*  --  16.9*  CREATININE 0.81  --  0.79  LATICACIDVEN  --  0.93  --     Estimated Creatinine Clearance: 79.2 mL/min (by C-G formula based on SCr of 0.79 mg/dL).    No Known Allergies  Antimicrobials this admission: 10/21 Zosyn x 1 Zosyn 10 /22 > Cipro 10/21 > 10/22 Flagyl 10/21 > 10/22  Dose adjustments this admission: n/a  Microbiology results: n/a  Thank you for allowing pharmacy to be a part of this patient's care.  [employer], Pharm.D., BCPS Clinical Pharmacist Pager: [phone] Clinical phone for 11/25/2016 from 8:30-4:00 is [phone]. After 4pm, please call Main Rx [phone]) for assistance. 11/25/2016 10:44 AM

## 2016-11-26 ENCOUNTER — Ambulatory Visit: Payer: Medicare Other | Admitting: Endocrinology

## 2016-11-26 DIAGNOSIS — F17200 Nicotine dependence, unspecified, uncomplicated: Secondary | ICD-10-CM

## 2016-11-26 LAB — BASIC METABOLIC PANEL
ANION GAP: 12 (ref 5–15)
BUN: 6 mg/dL (ref 6–20)
CALCIUM: 9.1 mg/dL (ref 8.9–10.3)
CO2: 24 mmol/L (ref 22–32)
Chloride: 100 mmol/L — ABNORMAL LOW (ref 101–111)
Creatinine, Ser: 0.9 mg/dL (ref 0.61–1.24)
GFR calc Af Amer: >60 mL/min (ref >60)
Glucose, Bld: 81 mg/dL (ref 65–99)
POTASSIUM: 3.7 mmol/L (ref 3.5–5.1)
SODIUM: 136 mmol/L (ref 135–145)

## 2016-11-26 LAB — GLUCOSE, CAPILLARY
GLUCOSE-CAPILLARY: 117 mg/dL — AB (ref 65–99)
Glucose-Capillary: 86 mg/dL (ref 65–99)
Glucose-Capillary: 99 mg/dL (ref 65–99)

## 2016-11-26 LAB — CBC
HCT: 38.7 % — ABNORMAL LOW (ref 39.0–52.0)
Hemoglobin: 12.6 g/dL — ABNORMAL LOW (ref 13.0–17.0)
MCH: 29.1 pg (ref 26.0–34.0)
MCHC: 32.6 g/dL (ref 30.0–36.0)
MCV: 89.4 fL (ref 78.0–100.0)
PLATELETS: 310 10*3/uL (ref 150–400)
RBC: 4.33 MIL/uL (ref 4.22–5.81)
RDW: 15.1 % (ref 11.5–15.5)
WBC: 20 10*3/uL — AB (ref 4.0–10.5)

## 2016-11-26 NOTE — Consult Note (Signed)
   [facility] CM Inpatient Consult   11/26/2016  Benjamin Cook 1946/01/06 [id number]   Patient assessed for re-admission in the [place]. Patient is a 71 year old with hx of HF, AAA, COPD with tobacco abuse and a recent admission with Diverticulitis.  Met with the patient at the bedside and patient endorses Dr. Renato Shin as his primary care provider.  Patient states, "I wasn't right when I discharged last week because I was still sick with the same issue but, I feel much better now."  Patient states he has tele-monitoring for his HF through his [employer]. 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 I don't have any problems. Please leave the information for my wife."  Patient was given a brochure, 24 hour nurse advise line magnet and contact information and encourage to call for needs.  Patient will receive General EMMI follow up call. He states, "I appreciate you coming by and telling me about it."  No needs at this time.   For questions, please contact:  Natividad Brood, RN BSN [facility] Liaison  [phone] business mobile phone Toll free office [phone]

## 2016-11-26 NOTE — Progress Notes (Signed)
Subjective/Chief Complaint: Feels better less pain   Objective: Vital signs in last 24 hours: Temp:  [98.2 F (36.8 C)-98.9 F (37.2 C)] 98.8 F (37.1 C) (10/23 1330) Pulse Rate:  [64-76] 75 (10/23 1330) Resp:  [17-20] 20 (10/23 1330) BP: (137-153)/(60-78) 153/78 (10/23 1330) SpO2:  [92 %-94 %] 94 % (10/23 1330) Weight:  [63.5 kg (140 lb 1.6 oz)] 63.5 kg (140 lb 1.6 oz) (10/23 0530) Last BM Date: 11/25/16  Intake/Output from previous day: 10/22 0701 - 10/23 0700 In: 546.7 [I.V.:496.7; IV Piggyback:50] Out: -  Intake/Output this shift: Total I/O In: 230 [P.O.:180; IV Piggyback:50] Out: -   GI: min LLQ tenderness   Lab Results:   Recent Labs  11/25/16 0522 11/26/16 0515  WBC 16.9* 20.0*  HGB 12.2* 12.6*  HCT 37.4* 38.7*  PLT 269 310   BMET  Recent Labs  11/25/16 0522 11/26/16 0515  NA 134* 136  K 3.8 3.7  CL 103 100*  CO2 23 24  GLUCOSE 118* 81  BUN 7 6  CREATININE 0.79 0.90  CALCIUM 8.5* 9.1   PT/INR  Recent Labs  11/25/16 0522  LABPROT 23.5*  INR 2.11   ABG No results for input(s): PHART, HCO3 in the last 72 hours.  Invalid input(s): PCO2, PO2  Studies/Results: Ct Abdomen Pelvis W Contrast  Result Date: 11/24/2016 CLINICAL DATA:  71 year old male with abdominal and pelvic pain for 1 week. EXAM: CT ABDOMEN AND PELVIS WITH CONTRAST TECHNIQUE: Multidetector CT imaging of the abdomen and pelvis was performed using the standard protocol following bolus administration of intravenous contrast. CONTRAST:  197mL ISOVUE-300 IOPAMIDOL (ISOVUE-300) INJECTION 61% COMPARISON:  11/15/2016 and prior CTs FINDINGS: Lower chest: No acute abnormality. Hepatobiliary: The liver and gallbladder are unremarkable. No biliary dilatation. Pancreas: Unremarkable Spleen: Unremarkable Adrenals/Urinary Tract: A 3.1 x 4 cm low-density right adrenal mass is unchanged from 2005-benign. Small bilateral renal cysts are present. There is no evidence of hydronephrosis. The  visualized portions of the bladder are unremarkable. Stomach/Bowel: New 5.5 x 7.6 x 5 cm ill-defined phlegmonous change with foci of free air noted adjacent to the sigmoid colon at this site of recent acute diverticulitis. No discrete abscess is identified. There is no evidence of bowel obstruction. The appendix is normal. Vascular/Lymphatic: A 5.5 cm infrarenal suprailiac abdominal aortic aneurysm is again noted with unchanged 2.2 cm right common iliac and 2 cm left common iliac artery aneurysms. No enlarged lymph nodes. Reproductive: Marked prostate enlargement again noted. Other: No ascites. Small bilateral inguinal hernias containing fat again identified. Musculoskeletal: No acute or does suspicious focal bony abnormality. Moderate to severe degenerative disc disease and spondylosis in the lower lumbar spine again noted. IMPRESSION: 1. Increasing inflammatory changes from recent acute sigmoid diverticulitis, now with new 5.5 x 7.6 x 5 cm ill-defined phlegmon with foci of free air adjacent to the sigmoid colon. No discrete abscess identified. No evidence of bowel obstruction. 2. 5.5 cm infrarenal suprailiac abdominal aortic aneurysm, unchanged from 11/15/2016, but significantly increased from 2005. As noted previously, vascular surgery consultation recommended if not performed. 3. Common iliac artery aneurysms as described 4. Marked prostate enlargement and small bilateral inguinal hernias containing fat 5. Benign right adrenal mass/adenoma. Electronically Signed   By: Margarette Canada M.D.   On: 11/24/2016 15:03    Anti-infectives: Anti-infectives    Start     Dose/Rate Route Frequency Ordered Stop   11/25/16 1400  piperacillin-tazobactam (ZOSYN) IVPB 3.375 g     3.375 g 12.5 mL/hr over 240  Minutes Intravenous Every 8 hours 11/25/16 1045     11/24/16 2330  piperacillin-tazobactam (ZOSYN) IVPB 3.375 g  Status:  Discontinued     3.375 g 12.5 mL/hr over 240 Minutes Intravenous Every 8 hours 11/24/16 1511  11/24/16 1634   11/24/16 1700  metroNIDAZOLE (FLAGYL) IVPB 500 mg  Status:  Discontinued     500 mg 100 mL/hr over 60 Minutes Intravenous Every 8 hours 11/24/16 1631 11/25/16 0847   11/24/16 1700  ciprofloxacin (CIPRO) IVPB 400 mg  Status:  Discontinued     400 mg 200 mL/hr over 60 Minutes Intravenous Every 12 hours 11/24/16 1635 11/25/16 0847   11/24/16 1530  piperacillin-tazobactam (ZOSYN) IVPB 3.375 g     3.375 g 100 mL/hr over 30 Minutes Intravenous  Once 11/24/16 1511 11/24/16 1612      Assessment/Plan: Patient Active Problem List   Diagnosis Date Noted  . Adrenal mass ([place]) 11/24/2016  . Acute Sigmoid diverticulitis 11/15/2016  . Diverticulitis 11/15/2016  . ICD (implantable cardioverter-defibrillator) lead failure 07/16/2016  . Paroxysmal atrial fibrillation ([place]) 06/26/2015  . Dyspnea 04/10/2015  . Acute respiratory failure ([place]) 03/28/2015  . Essential hypertension 03/28/2015  . Systolic CHF ([place]) 58/83/2549  . Pyrexia   . Shortness of breath   . Influenza A   . VT (ventricular tachycardia) ([place])   . ICD (implantable cardioverter-defibrillator) in place 06/08/2010  . Chronic systolic heart failure ([place]) 04/18/2010  . WEIGHT LOSS 11/01/2008  . TOBACCO ABUSE 09/06/2008  . Aneurysm of abdominal vessel (5.6 CM) 09/06/2008  . HLD (hyperlipidemia) 09/05/2008  . COUGH 04/08/2007  . PSA, INCREASED 04/08/2007  . OTHER ABNORMAL BLOOD CHEMISTRY 03/24/2007  . ADRENAL MASS 10/01/2006  . Anxiety state 10/01/2006  . HYPERTENSION 10/01/2006  . Coronary atherosclerosis 10/01/2006  . Cardiomyopathy, ischemic 10/01/2006  . COPD, severe ([place]) 10/01/2006  . HYPERGLYCEMIA 10/01/2006  . HEMATURIA, HX OF 10/01/2006    Much better  Advance diet  Continue ABX    LOS: 1 day    Rhylen Pulido A. 11/26/2016

## 2016-11-26 NOTE — Progress Notes (Signed)
PROGRESS NOTE    Benjamin Cook  WJX:[id number] DOB: [birth year] DOA: 11/24/2016 PCP: Renato Shin, MD   Outpatient Specialists:     Brief Narrative:  Benjamin Cook is a 71 y.o. male with medical history significant for ischemic cardiomyopathy, last known EF 35%, history ofabdominal aortic aneurysm, tobacco abuse, COPD, and recent admission for diverticulitis from 1013 through 11/18/2016 treated with IV fluids, IV Zosyn, discharged on Augmentin, which he never filled out. He states since discharge, the patient did not feeling well. He presents today with generalized lower quadrant tenderness, especially on the left, nausea without vomiting. He denies any diarrhea. He denies any food poisoning. He also denies eating any spicy foods, or seedy foods such as tomatoes or nuts.he denies any fever, chills or night sweats. He denies any chest pain or palpitations. He denies any shortness of breath or cough.he denies any lower extremity swelling. He continues to smoke, he denies any recreational drug use.   Assessment & Plan:   Active Problems:   HLD (hyperlipidemia)   Anxiety state   TOBACCO ABUSE   Cardiomyopathy, ischemic   Aneurysm of abdominal vessel (5.6 CM)   COPD, severe ([name])   HYPERGLYCEMIA   PSA, INCREASED   Chronic systolic heart failure ([name])   ICD (implantable cardioverter-defibrillator) in place   Essential hypertension   Acute Sigmoid diverticulitis   Diverticulitis   Adrenal mass ([place])   Sigmoid diverticulitis, non resolved from prior admission, worsening symptoms. .CT abdomen and pelvis shows increasing inflammatory changes from recent acute sigmoid diverticulitis, now with new 5.5 x 7.6 x 5 cm ill-defined phlegmon with foci of free air adjacent to the sigmoid colon. No discrete abscess identified. No evidence of bowel obstruction. -clears -zosyn- suspect will need IV abx x 7 days-- failed PO abx -will need close monitoring in the hospital Protonix 40 mg IV every  24 hours. Analgesics and antiemetics as needed. GS consult: trying to avoid surgery but will need cardiology eval to maximize medical therapy prior -cardiology consult  Chronic systolic  CHF  03/1306 EF 65-78 percent, mod to severely reduced systolic, Gr 1 DD. Weight 150. No acute decompensation    Tobacco abuse without nicotine withdrawal/ COPD  -  Nicotine patch if needed, he declined at this time  -  Counseled cessation Continue inhalers prn   History of AAA, now enlarging per last CT abdomen.  5.5 cm infrarenal suprailiac abdominal aortic aneurysm, unchanged from 11/15/2016, but significantly increased from 2005 Vascular surgery to operate once diverticulitis is resolved   Hypertension BP 132/72   Pulse 80  Continue home anti-hypertensive medications   Hyperlipidemia Continue home statins    DVT prophylaxis:   SCD's  Code Status:  Full Code   Family Communication: At bedside  Disposition Plan:     Consultants:   [place]  cardiology  Procedures:    Subjective: Improved some today Still poor surgical candidate  Objective: Vitals:   11/25/16 2118 11/26/16 0530 11/26/16 0818 11/26/16 1330  BP: (!) 145/61 (!) 152/70  (!) 153/78  Pulse: 64 76  75  Resp: 18 17  20   Temp: 98.2 F (36.8 C) 98.9 F (37.2 C)  98.8 F (37.1 C)  TempSrc: Oral Oral  Oral  SpO2: 93% 94% 93% 94%  Weight:  63.5 kg (140 lb 1.6 oz)    Height:        Intake/Output Summary (Last 24 hours) at 11/26/16 1455 Last data filed at 11/26/16 1352  Gross per 24 hour  Intake  776.67 ml  Output                0 ml  Net           776.67 ml   Filed Weights   11/24/16 1808 11/25/16 0540 11/26/16 0530  Weight: 67.2 kg (148 lb 1.6 oz) 68.1 kg (150 lb 3.2 oz) 63.5 kg (140 lb 1.6 oz)    Examination:  General exam: NAD Respiratory system: clear Cardiovascular system: rrr Gastrointestinal system: +BS, not as tender Central nervous system: A+Ox3 Psychiatry: normal mood.      Data Reviewed: I have personally reviewed following labs and imaging studies  CBC:  Recent Labs Lab 11/24/16 1038 11/25/16 0522 11/26/16 0515  WBC 17.1* 16.9* 20.0*  HGB 14.0 12.2* 12.6*  HCT 41.0 37.4* 38.7*  MCV 88.4 89.3 89.4  PLT 300 269 637   Basic Metabolic Panel:  Recent Labs Lab 11/24/16 1038 11/25/16 0522 11/26/16 0515  NA 135 134* 136  K 3.9 3.8 3.7  CL 102 103 100*  CO2 28 23 24   GLUCOSE 111* 118* 81  BUN 7 7 6   CREATININE 0.81 0.79 0.90  CALCIUM 9.2 8.5* 9.1   GFR: Estimated Creatinine Clearance: 65.7 mL/min (by C-G formula based on SCr of 0.9 mg/dL). Liver Function Tests:  Recent Labs Lab 11/24/16 1038 11/25/16 0522  AST 20 15  ALT 14* 12*  ALKPHOS 65 53  BILITOT 0.6 0.8  PROT 7.9 6.7  ALBUMIN 3.1* 2.7*    Recent Labs Lab 11/24/16 1038  LIPASE 26   No results for input(s): AMMONIA in the last 168 hours. Coagulation Profile:  Recent Labs Lab 11/25/16 0522  INR 2.11   Cardiac Enzymes: No results for input(s): CKTOTAL, CKMB, CKMBINDEX, TROPONINI in the last 168 hours. BNP (last 3 results) No results for input(s): PROBNP in the last 8760 hours. HbA1C: No results for input(s): HGBA1C in the last 72 hours. CBG:  Recent Labs Lab 11/26/16 0747 11/26/16 1207  GLUCAP 86 99   Lipid Profile: No results for input(s): CHOL, HDL, LDLCALC, TRIG, CHOLHDL, LDLDIRECT in the last 72 hours. Thyroid Function Tests: No results for input(s): TSH, T4TOTAL, FREET4, T3FREE, THYROIDAB in the last 72 hours. Anemia Panel: No results for input(s): VITAMINB12, FOLATE, FERRITIN, TIBC, IRON, RETICCTPCT in the last 72 hours. Urine analysis:    Component Value Date/Time   COLORURINE YELLOW 11/24/2016 1038   APPEARANCEUR TURBID (A) 11/24/2016 1038   LABSPEC 1.025 11/24/2016 1038   PHURINE 5.0 11/24/2016 1038   GLUCOSEU NEGATIVE 11/24/2016 1038   GLUCOSEU NEGATIVE 04/10/2015 1220   HGBUR MODERATE (A) 11/24/2016 1038   BILIRUBINUR NEGATIVE  11/24/2016 1038   KETONESUR NEGATIVE 11/24/2016 1038   PROTEINUR 100 (A) 11/24/2016 1038   UROBILINOGEN 0.2 04/10/2015 1220   NITRITE NEGATIVE 11/24/2016 [place] 11/24/2016 1038     ) No results found for this or any previous visit (from the past 240 hour(s)).    Anti-infectives    Start     Dose/Rate Route Frequency Ordered Stop   11/25/16 1400  piperacillin-tazobactam (ZOSYN) IVPB 3.375 g     3.375 g 12.5 mL/hr over 240 Minutes Intravenous Every 8 hours 11/25/16 1045     11/24/16 2330  piperacillin-tazobactam (ZOSYN) IVPB 3.375 g  Status:  Discontinued     3.375 g 12.5 mL/hr over 240 Minutes Intravenous Every 8 hours 11/24/16 1511 11/24/16 1634   11/24/16 1700  metroNIDAZOLE (FLAGYL) IVPB 500 mg  Status:  Discontinued  500 mg 100 mL/hr over 60 Minutes Intravenous Every 8 hours 11/24/16 1631 11/25/16 0847   11/24/16 1700  ciprofloxacin (CIPRO) IVPB 400 mg  Status:  Discontinued     400 mg 200 mL/hr over 60 Minutes Intravenous Every 12 hours 11/24/16 1635 11/25/16 0847   11/24/16 1530  piperacillin-tazobactam (ZOSYN) IVPB 3.375 g     3.375 g 100 mL/hr over 30 Minutes Intravenous  Once 11/24/16 1511 11/24/16 1612       Radiology Studies: No results found.      Scheduled Meds: . atorvastatin  80 mg Oral q1800  . carvedilol  25 mg Oral BID WC  . furosemide  20 mg Oral Daily  . lisinopril  20 mg Oral Daily  . mometasone-formoterol  2 puff Inhalation BID  . pantoprazole (PROTONIX) IV  40 mg Intravenous QHS  . potassium chloride SA  40 mEq Oral Daily   Continuous Infusions: . sodium chloride 50 mL/hr at 11/25/16 1512  . piperacillin-tazobactam (ZOSYN)  IV 3.375 g (11/26/16 1352)     LOS: 1 day    Time spent: 25 min    [place], DO [facility] Pager [phone]  If 7PM-7AM, please contact night-coverage [email or web address] Password [email or web address] 11/26/2016, 2:55 PM

## 2016-11-27 DIAGNOSIS — I255 Ischemic cardiomyopathy: Secondary | ICD-10-CM

## 2016-11-27 DIAGNOSIS — I10 Essential (primary) hypertension: Secondary | ICD-10-CM

## 2016-11-27 LAB — CBC
HCT: 36.8 % — ABNORMAL LOW (ref 39.0–52.0)
HEMOGLOBIN: 12.3 g/dL — AB (ref 13.0–17.0)
MCH: 29.4 pg (ref 26.0–34.0)
MCHC: 33.4 g/dL (ref 30.0–36.0)
MCV: 87.8 fL (ref 78.0–100.0)
Platelets: 279 10*3/uL (ref 150–400)
RBC: 4.19 MIL/uL — AB (ref 4.22–5.81)
RDW: 14.8 % (ref 11.5–15.5)
WBC: 11.9 10*3/uL — AB (ref 4.0–10.5)

## 2016-11-27 LAB — BASIC METABOLIC PANEL
ANION GAP: 14 (ref 5–15)
BUN: 8 mg/dL (ref 6–20)
CHLORIDE: 100 mmol/L — AB (ref 101–111)
CO2: 25 mmol/L (ref 22–32)
Calcium: 8.8 mg/dL — ABNORMAL LOW (ref 8.9–10.3)
Creatinine, Ser: 0.78 mg/dL (ref 0.61–1.24)
GFR calc Af Amer: >60 mL/min (ref >60)
GFR calc non Af Amer: >60 mL/min (ref >60)
Glucose, Bld: 96 mg/dL (ref 65–99)
POTASSIUM: 3.1 mmol/L — AB (ref 3.5–5.1)
SODIUM: 139 mmol/L (ref 135–145)

## 2016-11-27 LAB — APTT
APTT: 33 s (ref 24–36)
aPTT: 116 seconds — ABNORMAL HIGH (ref 24–36)

## 2016-11-27 LAB — HEPARIN LEVEL (UNFRACTIONATED)
HEPARIN UNFRACTIONATED: 0.61 [IU]/mL (ref 0.30–0.70)
Heparin Unfractionated: 0.15 IU/mL — ABNORMAL LOW (ref 0.30–0.70)

## 2016-11-27 LAB — MAGNESIUM: MAGNESIUM: 1.8 mg/dL (ref 1.7–2.4)

## 2016-11-27 MED ORDER — LISINOPRIL 40 MG PO TABS
40.0000 mg | ORAL_TABLET | Freq: Every day | ORAL | Status: DC
  Administered 2016-11-28 – 2016-11-30 (×3): 40 mg via ORAL
  Filled 2016-11-27 (×3): qty 1

## 2016-11-27 MED ORDER — BACID PO TABS
2.0000 | ORAL_TABLET | Freq: Three times a day (TID) | ORAL | Status: DC
  Administered 2016-11-27 – 2016-11-30 (×9): 2 via ORAL
  Filled 2016-11-27 (×12): qty 2

## 2016-11-27 MED ORDER — HYDRALAZINE HCL 20 MG/ML IJ SOLN
5.0000 mg | INTRAMUSCULAR | Status: DC | PRN
  Administered 2016-11-27: 5 mg via INTRAVENOUS
  Filled 2016-11-27: qty 1

## 2016-11-27 MED ORDER — MAGNESIUM SULFATE 2 GM/50ML IV SOLN
2.0000 g | Freq: Once | INTRAVENOUS | Status: AC
  Administered 2016-11-27: 2 g via INTRAVENOUS
  Filled 2016-11-27: qty 50

## 2016-11-27 MED ORDER — POTASSIUM CHLORIDE CRYS ER 20 MEQ PO TBCR
40.0000 meq | EXTENDED_RELEASE_TABLET | Freq: Once | ORAL | Status: AC
  Administered 2016-11-27: 40 meq via ORAL
  Filled 2016-11-27: qty 2

## 2016-11-27 MED ORDER — LISINOPRIL 20 MG PO TABS
20.0000 mg | ORAL_TABLET | Freq: Once | ORAL | Status: AC
  Administered 2016-11-27: 20 mg via ORAL
  Filled 2016-11-27: qty 1

## 2016-11-27 MED ORDER — HEPARIN (PORCINE) IN NACL 100-0.45 UNIT/ML-% IJ SOLN
650.0000 [IU]/h | INTRAMUSCULAR | Status: DC
  Administered 2016-11-27: 850 [IU]/h via INTRAVENOUS
  Filled 2016-11-27 (×2): qty 250

## 2016-11-27 NOTE — Progress Notes (Signed)
ANTICOAGULATION CONSULT NOTE - Initial Consult  Pharmacy Consult for Heparin Indication: atrial fibrillation (while Xarelto on hold)  No Known Allergies  Patient Measurements: Height: 5\' 11"  (180.3 cm) Weight: 140 lb 1.6 oz (63.5 kg) IBW/kg (Calculated) : 75.3 Heparin Dosing Weight: 63 kg  Vital Signs: Temp: 98.8 F (37.1 C) (10/24 0507) Temp Source: Oral (10/24 0507) BP: 167/83 (10/24 0706) Pulse Rate: 61 (10/24 0812)  Labs:  Recent Labs  11/25/16 0522 11/26/16 0515 11/27/16 0520  HGB 12.2* 12.6* 12.3*  HCT 37.4* 38.7* 36.8*  PLT 269 310 279  LABPROT 23.5*  --   --   INR 2.11  --   --   CREATININE 0.79 0.90 0.78    Estimated Creatinine Clearance: 73.9 mL/min (by C-G formula based on SCr of 0.78 mg/dL).   Medical History: Past Medical History:  Diagnosis Date  . Adrenal mass ([place])    per pt this is remote (10 years) and benign by biopsy  . AICD (automatic cardioverter/defibrillator) present   . Anxiety   . CAD (coronary artery disease)    a. s/p PCI to LAD 2001 b. myoview 2014 high risk with scar LAD/RCA territory but no ischemia  . Cardiomyopathy, ischemic   . CHF (congestive heart failure) ([name])    class II/III  . COPD (chronic obstructive pulmonary disease) ([name])    smokes cigars but has quit cigarettes  . Flu 03/2015  . HTN (hypertension)   . Hyperlipidemia   . Paroxysmal atrial fibrillation ([name]) 03/2015   chads2vasc score is 4  . PSA (psoriatic arthritis) ([place])    increased  . PVD (peripheral vascular disease) ([name])     Medications:  Scheduled:  . atorvastatin  80 mg Oral q1800  . carvedilol  25 mg Oral BID WC  . furosemide  20 mg Oral Daily  . lactobacillus acidophilus  2 tablet Oral TID  . lisinopril  20 mg Oral Once  . [START ON 11/28/2016] lisinopril  40 mg Oral Daily  . mometasone-formoterol  2 puff Inhalation BID  . pantoprazole (PROTONIX) IV  40 mg Intravenous QHS  . potassium chloride SA  40 mEq Oral Daily   Infusions:  .  piperacillin-tazobactam (ZOSYN)  IV 3.375 g (11/27/16 0703)    Assessment: 71 yo M admitted with diverticulitis.  CT showed abscess but not amenable to drain placement.  Pt started on IV Zosyn and is clinically improving.  PTA pt was on Xarelto for a hx of afib.  This has been held since admission in the event that surgical intervention was needed.  Last Xarelto dose 10/20.  To start heparin.  Anticipate Xarelto is out of the system and has little effect on heparin level at this point but will check baseline labs to confirm.  Goal of Therapy:  Heparin level 0.3-0.7 units/ml aPTT 66-102 seconds Monitor platelets by anticoagulation protocol: Yes   Plan:  Heparin level and aPTT STAT Start heparin at 850 units/hr (no bolus). Heparin level in 8 hours. Follow-up plans to restart oral anticoagulation.  [employer], Pharm.D., BCPS Clinical Pharmacist Pager: [phone] Clinical phone for 11/27/2016 from 8:30-4:00 is [phone]. After 4pm, please call [facility] [phone]) for assistance. 11/27/2016 12:01 PM

## 2016-11-27 NOTE — Progress Notes (Signed)
Pt had high BP; 174/72, some runs of Vtach and wide QRS on his telemetry strip. MD was notified. Orders for Hydralazine, BMet and Magnesium were put in. Will continue to monitor.

## 2016-11-27 NOTE — Progress Notes (Signed)
Pt just had an 8 beat run of VTach. Dr. Eliseo Squires notified.

## 2016-11-27 NOTE — Progress Notes (Signed)
ANTICOAGULATION CONSULT NOTE - Follow Up Consult  Pharmacy Consult for Heparin (Xarelto on hold) Indication: atrial fibrillation  No Known Allergies  Patient Measurements: Height: 5\' 11"  (180.3 cm) Weight: 140 lb 1.6 oz (63.5 kg) IBW/kg (Calculated) : 75.3  Vital Signs: Temp: 98.5 F (36.9 C) (10/24 2119) Temp Source: Oral (10/24 2119) BP: 111/60 (10/24 2119) Pulse Rate: 78 (10/24 2119)  Labs:  Recent Labs  11/25/16 0522 11/26/16 0515 11/27/16 0520 11/27/16 1206 11/27/16 2136  HGB 12.2* 12.6* 12.3*  --   --   HCT 37.4* 38.7* 36.8*  --   --   PLT 269 310 279  --   --   APTT  --   --   --  33 116*  LABPROT 23.5*  --   --   --   --   INR 2.11  --   --   --   --   HEPARINUNFRC  --   --   --  0.15* 0.61  CREATININE 0.79 0.90 0.78  --   --     Estimated Creatinine Clearance: 73.9 mL/min (by C-G formula based on SCr of 0.78 mg/dL).  Assessment: 71 y/o M on heparin while Xarelto on hold in case surgery is needed. APTT is elevated tonight. Currently using aPTT to dose given Xareltol influence on anti-Xa levels (baseline was elevated at 0.15). No issues per RN.   Goal of Therapy:  Heparin level 0.3-0.7 units/ml aPTT 66-102 seconds Monitor platelets by anticoagulation protocol: Yes   Plan:  -Dec heparin to 750 units/hr -0800 aPTT/HL -Should be able to use anti-Xa only to dose 10/25 PM  Narda Bonds 11/27/2016,11:43 PM

## 2016-11-27 NOTE — Progress Notes (Signed)
PROGRESS NOTE    Benjamin Cook  BXU:[id number] DOB: Feb 11, 1945 DOA: 11/24/2016 PCP: Renato Shin, MD   Outpatient Specialists:     Brief Narrative:  Benjamin Cook is a 71 y.o. male with medical history significant for ischemic cardiomyopathy, last known EF 35%, history ofabdominal aortic aneurysm, tobacco abuse, COPD, and recent admission for diverticulitis from 1013 through 11/18/2016 treated with IV fluids, IV Zosyn, discharged on Augmentin-- He says he finished the course of abx.. Not a good surgical candidate so will treat with 7 days of IV abx.     Assessment & Plan:   Active Problems:   HLD (hyperlipidemia)   Anxiety state   TOBACCO ABUSE   Cardiomyopathy, ischemic   Aneurysm of abdominal vessel (5.6 CM)   COPD, severe ([name])   HYPERGLYCEMIA   PSA, INCREASED   Chronic systolic heart failure ([name])   ICD (implantable cardioverter-defibrillator) in place   Essential hypertension   Acute Sigmoid diverticulitis   Diverticulitis   Adrenal mass ([place])   Sigmoid diverticulitis, non resolved from prior admission, worsening symptoms. .CT abdomen and pelvis shows increasing inflammatory changes from recent acute sigmoid diverticulitis, now with new 5.5 x 7.6 x 5 cm ill-defined phlegmon with foci of free air adjacent to the sigmoid colon. No discrete abscess identified. No evidence of bowel obstruction.(seen by IR and no window opportunity for drainage) -advance diet per surgery -zosyn- suspect will need IV abx x 7 days-- failed PO abx- stop date "Sunday -will need close monitoring in the hospital Protonix 40 mg IV every 24 hours. Analgesics and antiemetics as needed. GS consult: trying to avoid surgery but will need cardiology eval to maximize medical therapy prior -cardiology consult -while inpatient, will do heparin until d/c'd and place back on NOAC  Chronic systolic  CHF  03/2015 EF 30-35 percent, mod to severely reduced systolic, Gr 1 DD. Weight 150. No acute  decompensation    Tobacco abuse without nicotine withdrawal/ COPD  -  Nicotine patch if needed, he declined at this time  -  Counseled cessation Continue inhalers prn   History of AAA, now enlarging per last CT abdomen.  5.5 cm infrarenal suprailiac abdominal aortic aneurysm, unchanged from 11/15/2016, but significantly increased from 2005 Vascular surgery to operate once diverticulitis is resolved   Hypertension BP 132/72   Pulse 80  Continue home anti-hypertensive medications   Hyperlipidemia Continue home statins  Hypokalemia/hupomagnesemia -replete   DVT prophylaxis:  heparin   Code Status:  Full Code   Family Communication: At bedside  Disposition Plan:     Consultants:   [facility]  cardiology  Procedures:    Subjective: Multiple stools Pain better  Objective: Vitals:   11/26/16 2159 11/27/16 0507 11/27/16 0706 11/27/16 0812  BP: (!) 144/72 (!) 174/72 (!) 167/83   Pulse: 69 71  61  Resp: 18 18  20  Temp: 98.6 F (37 C) 98.8 F (37.1 C)    TempSrc: Oral Oral    SpO2: 97% 95%  94%  Weight:      Height:        Intake/Output Summary (Last 24 hours) at 11/27/16 1128 Last data filed at 11/27/16 0900  Gross per 24 hour  Intake           703.33 ml  Output                0 ml  Net           70" 3.33 ml   [employer]  11/24/16 1808 11/25/16 0540 11/26/16 0530  Weight: 67.2 kg (148 lb 1.6 oz) 68.1 kg (150 lb 3.2 oz) 63.5 kg (140 lb 1.6 oz)    Examination:  General exam: in bed, NAD Respiratory system: clear Cardiovascular system: rrr Gastrointestinal system: +BS, soft, non tender Central nervous system: A+Ox3 Psychiatry: normal mood     Data Reviewed: I have personally reviewed following labs and imaging studies  CBC:  Recent Labs Lab 11/24/16 1038 11/25/16 0522 11/26/16 0515 11/27/16 0520  WBC 17.1* 16.9* 20.0* 11.9*  HGB 14.0 12.2* 12.6* 12.3*  HCT 41.0 37.4* 38.7* 36.8*  MCV 88.4 89.3 89.4 87.8  PLT 300 269 310 341    Basic Metabolic Panel:  Recent Labs Lab 11/24/16 1038 11/25/16 0522 11/26/16 0515 11/27/16 0520  NA 135 134* 136 139  K 3.9 3.8 3.7 3.1*  CL 102 103 100* 100*  CO2 28 23 24 25   GLUCOSE 111* 118* 81 96  BUN 7 7 6 8   CREATININE 0.81 0.79 0.90 0.78  CALCIUM 9.2 8.5* 9.1 8.8*  MG  --   --   --  1.8   GFR: Estimated Creatinine Clearance: 73.9 mL/min (by C-G formula based on SCr of 0.78 mg/dL). Liver Function Tests:  Recent Labs Lab 11/24/16 1038 11/25/16 0522  AST 20 15  ALT 14* 12*  ALKPHOS 65 53  BILITOT 0.6 0.8  PROT 7.9 6.7  ALBUMIN 3.1* 2.7*    Recent Labs Lab 11/24/16 1038  LIPASE 26   No results for input(s): AMMONIA in the last 168 hours. Coagulation Profile:  Recent Labs Lab 11/25/16 0522  INR 2.11   Cardiac Enzymes: No results for input(s): CKTOTAL, CKMB, CKMBINDEX, TROPONINI in the last 168 hours. BNP (last 3 results) No results for input(s): PROBNP in the last 8760 hours. HbA1C: No results for input(s): HGBA1C in the last 72 hours. CBG:  Recent Labs Lab 11/26/16 0747 11/26/16 1207 11/26/16 1650  GLUCAP 86 99 117*   Lipid Profile: No results for input(s): CHOL, HDL, LDLCALC, TRIG, CHOLHDL, LDLDIRECT in the last 72 hours. Thyroid Function Tests: No results for input(s): TSH, T4TOTAL, FREET4, T3FREE, THYROIDAB in the last 72 hours. Anemia Panel: No results for input(s): VITAMINB12, FOLATE, FERRITIN, TIBC, IRON, RETICCTPCT in the last 72 hours. Urine analysis:    Component Value Date/Time   COLORURINE YELLOW 11/24/2016 1038   APPEARANCEUR TURBID (A) 11/24/2016 1038   LABSPEC 1.025 11/24/2016 1038   PHURINE 5.0 11/24/2016 1038   GLUCOSEU NEGATIVE 11/24/2016 1038   GLUCOSEU NEGATIVE 04/10/2015 1220   HGBUR MODERATE (A) 11/24/2016 1038   BILIRUBINUR NEGATIVE 11/24/2016 1038   KETONESUR NEGATIVE 11/24/2016 1038   PROTEINUR 100 (A) 11/24/2016 1038   UROBILINOGEN 0.2 04/10/2015 1220   NITRITE NEGATIVE 11/24/2016 [place] 11/24/2016 1038     ) No results found for this or any previous visit (from the past 240 hour(s)).    Anti-infectives    Start     Dose/Rate Route Frequency Ordered Stop   11/25/16 1400  piperacillin-tazobactam (ZOSYN) IVPB 3.375 g     3.375 g 12.5 mL/hr over 240 Minutes Intravenous Every 8 hours 11/25/16 1045     11/24/16 2330  piperacillin-tazobactam (ZOSYN) IVPB 3.375 g  Status:  Discontinued     3.375 g 12.5 mL/hr over 240 Minutes Intravenous Every 8 hours 11/24/16 1511 11/24/16 1634   11/24/16 1700  metroNIDAZOLE (FLAGYL) IVPB 500 mg  Status:  Discontinued     500 mg 100 mL/hr over  60 Minutes Intravenous Every 8 hours 11/24/16 1631 11/25/16 0847   11/24/16 1700  ciprofloxacin (CIPRO) IVPB 400 mg  Status:  Discontinued     400 mg 200 mL/hr over 60 Minutes Intravenous Every 12 hours 11/24/16 1635 11/25/16 0847   11/24/16 1530  piperacillin-tazobactam (ZOSYN) IVPB 3.375 g     3.375 g 100 mL/hr over 30 Minutes Intravenous  Once 11/24/16 1511 11/24/16 1612       Radiology Studies: No results found.      Scheduled Meds: . atorvastatin  80 mg Oral q1800  . carvedilol  25 mg Oral BID WC  . furosemide  20 mg Oral Daily  . lactobacillus acidophilus  2 tablet Oral TID  . lisinopril  20 mg Oral Once  . [START ON 11/28/2016] lisinopril  40 mg Oral Daily  . mometasone-formoterol  2 puff Inhalation BID  . pantoprazole (PROTONIX) IV  40 mg Intravenous QHS  . potassium chloride SA  40 mEq Oral Daily   Continuous Infusions: . piperacillin-tazobactam (ZOSYN)  IV 3.375 g (11/27/16 0703)     LOS: 2 days    Time spent: 25 min    [place], DO [facility] Pager [phone]  If 7PM-7AM, please contact night-coverage [email or web address] Password [email or web address] 11/27/2016, 11:28 AM

## 2016-11-27 NOTE — Progress Notes (Signed)
Progress Note  Patient Name: Benjamin Cook Date of Encounter: 11/27/2016  Primary Cardiologist: Dr. Stanford Cook Primary Electrophysiologist:  Dr. Rayann Cook   Subjective   Feeling much better. No chest pain or palpitations.   Inpatient Medications    Scheduled Meds: . atorvastatin  80 mg Oral q1800  . carvedilol  25 mg Oral BID WC  . furosemide  20 mg Oral Daily  . lactobacillus acidophilus  2 tablet Oral TID  . lisinopril  20 mg Oral Daily  . mometasone-formoterol  2 puff Inhalation BID  . pantoprazole (PROTONIX) IV  40 mg Intravenous QHS  . potassium chloride SA  40 mEq Oral Daily   Continuous Infusions: . magnesium sulfate 1 - 4 g bolus IVPB 2 g (11/27/16 1015)  . piperacillin-tazobactam (ZOSYN)  IV 3.375 g (11/27/16 0703)   PRN Meds: acetaminophen **OR** acetaminophen, bisacodyl, hydrALAZINE, HYDROcodone-acetaminophen, ondansetron **OR** ondansetron (ZOFRAN) IV, senna-docusate, witch hazel-glycerin   Vital Signs    Vitals:   11/26/16 2159 11/27/16 0507 11/27/16 0706 11/27/16 0812  BP: (!) 144/72 (!) 174/72 (!) 167/83   Pulse: 69 71  61  Resp: 18 18  20   Temp: 98.6 F (37 C) 98.8 F (37.1 C)    TempSrc: Oral Oral    SpO2: 97% 95%  94%  Weight:      Height:        Intake/Output Summary (Last 24 hours) at 11/27/16 1035 Last data filed at 11/26/16 1727  Gross per 24 hour  Intake           583.33 ml  Output                0 ml  Net           583.33 ml   Filed Weights   11/24/16 1808 11/25/16 0540 11/26/16 0530  Weight: 67.2 kg (148 lb 1.6 oz) 68.1 kg (150 lb 3.2 oz) 63.5 kg (140 lb 1.6 oz)    Telemetry    n/a - Personally Reviewed  ECG    n/a - Personally Reviewed  Physical Exam   VS:  BP 137/60 (BP Location: Left Arm)   Pulse 66   Temp 98.4 F (36.9 C) (Oral)   Resp 18   Ht 5\' 11"  (1.803 m)   Wt 68.1 kg (150 lb 3.2 oz)   SpO2 92%   BMI 20.95 kg/m  , BMI Body mass index is 20.95 kg/m. GENERAL:  Well appearing.  No acute distress HEENT:  Pupils equal round and reactive, fundi not visualized, oral mucosa unremarkable.  Edentulous NECK:  No jugular venous distention, waveform within normal limits, carotid upstroke brisk and symmetric, no bruits, no thyromegaly LUNGS: Scattered rhonchi.  No crackles, wheezes, or rubs. HEART:  RRR.  PMI not displaced or sustained,S1 and S2 within normal limits, no S3, no S4, no clicks, no rubs, II/VI systolic murmur at the LUSB ABD:  Flat, positive bowel sounds normal in frequency in pitch, no bruits, no rebound, no guarding.  Non-tender.  No midline pulsatile mass, no hepatomegaly, no splenomegaly EXT:  2 plus pulses throughout, no edema, no cyanosis no clubbing SKIN:  No rashes no nodules NEURO:  Cranial nerves II through XII grossly intact, motor grossly intact throughout PSYCH:  Cognitively intact, oriented to person place and time   Labs    Chemistry Recent Labs Lab 11/24/16 1038 11/25/16 0522 11/26/16 0515 11/27/16 0520  NA 135 134* 136 139  K 3.9 3.8 3.7 3.1*  CL 102 103  100* 100*  CO2 28 23 24 25   GLUCOSE 111* 118* 81 96  BUN 7 7 6 8   CREATININE 0.81 0.79 0.90 0.78  CALCIUM 9.2 8.5* 9.1 8.8*  PROT 7.9 6.7  --   --   ALBUMIN 3.1* 2.7*  --   --   AST 20 15  --   --   ALT 14* 12*  --   --   ALKPHOS 65 53  --   --   BILITOT 0.6 0.8  --   --   GFRNONAA >60 >60 >60 >60  GFRAA >60 >60 >60 >60  ANIONGAP 5 8 12 14      Hematology Recent Labs Lab 11/25/16 0522 11/26/16 0515 11/27/16 0520  WBC 16.9* 20.0* 11.9*  RBC 4.19* 4.33 4.19*  HGB 12.2* 12.6* 12.3*  HCT 37.4* 38.7* 36.8*  MCV 89.3 89.4 87.8  MCH 29.1 29.1 29.4  MCHC 32.6 32.6 33.4  RDW 14.9 15.1 14.8  PLT 269 310 279    Cardiac EnzymesNo results for input(s): TROPONINI in the last 168 hours. No results for input(s): TROPIPOC in the last 168 hours.   BNPNo results for input(s): BNP, PROBNP in the last 168 hours.   DDimer No results for input(s): DDIMER in the last 168 hours.   Radiology    No results  found.  Cardiac Studies   Echo 03/29/15: Study Conclusions  - Left ventricle: The cavity size was normal. Wall thickness was   increased in a pattern of mild LVH. Systolic function was   moderately to severely reduced. The estimated ejection fraction   was in the range of 30% to 35%. There is akinesis of the   anteroseptal and apical myocardium. Doppler parameters are   consistent with abnormal left ventricular relaxation (grade 1   diastolic dysfunction). - Left atrium: The atrium was mildly dilated.  Impressions:  - Akinesis of the anteroseptal and apical walls with moderate to   severe LV dysfunction; grade 1 diastolic dysfunction; mild LAE;   trace MR and TR; no LV apical thrombus noted using definity.  Patient Profile     71 y.o. male  with a hx of CAD, chronic systolic and diastolic heart failure, AAA, COPD, paroxysmal atrial fibrillation, tobacco abuse here and diverticulitis and microperforation  Assessment & Plan    # Hypertension:  Mr. Benjamin Cook blood pressure is poorly-controlled.  Continue carvedilol and increase lisinopril to 40 mg daily.  # NSVT: Mr. Benjamin Cook had an asymptomatic 8 beat run of NSVT.  Telemetry has been discontinued.  Potassium was 3.1 this am.  He received an extra 40 mEq.   # Pre-surgical risk assessment: Mr. Benjamin Cook is at elevated risk of cardiac complications.  The pla to manage medically if possible.  He is improving with IV antibiotics.n is   # Paroxysmal atrial fibrillation: Mr. Benjamin Cook is on Xarelto as an outpatient.  Resume when it becomes clear if he will need surgery.  In the meantime, would start IV heparin.   # AAA: Moderate 5.1cm infrarenal, supra-iliac  AAA noted on CT, which has increased from 2005.  He will need outpatient Vascular Surgery follow up.  Smoking cessation advised.   # Chronic systolic and diastolic heart failure:  # S/p ICD: Mr. Benjamin Cook is euvolemic.  ICD last interrogated 10/28/16 and functioning properly.   RV lead was fractured and revised 07/2016.   # CAD s/p coronary stent: LAD PCI 2001.  # Hyperlipidemia: No angina.  Continue atorvastatin.      #  Tobacco abuse: Cessation advised.  He is not interested in using the patch.     For questions or updates, please contact [place] Please consult [email or web address] for contact info under Cardiology/STEMI.      Signed, Skeet Latch, MD  11/27/2016, 10:35 AM

## 2016-11-27 NOTE — Progress Notes (Addendum)
   Subjective/Chief Complaint: Doing well Less pain moving bowels and tolerating diet    Objective: Vital signs in last 24 hours: Temp:  [98.6 F (37 C)-98.8 F (37.1 C)] 98.8 F (37.1 C) (10/24 0507) Pulse Rate:  [61-75] 61 (10/24 0812) Resp:  [18-20] 20 (10/24 0812) BP: (144-174)/(72-83) 167/83 (10/24 0706) SpO2:  [92 %-97 %] 94 % (10/24 0812) Last BM Date: 11/25/16  Intake/Output from previous day: 10/23 0701 - 10/24 0700 In: 763.3 [P.O.:300; I.V.:413.3; IV Piggyback:50] Out: -  Intake/Output this shift: No intake/output data recorded.  GI: soft less TTP LLQ   Lab Results:   Recent Labs  11/26/16 0515 11/27/16 0520  WBC 20.0* 11.9*  HGB 12.6* 12.3*  HCT 38.7* 36.8*  PLT 310 279   BMET  Recent Labs  11/26/16 0515 11/27/16 0520  NA 136 139  K 3.7 3.1*  CL 100* 100*  CO2 24 25  GLUCOSE 81 96  BUN 6 8  CREATININE 0.90 0.78  CALCIUM 9.1 8.8*   PT/INR  Recent Labs  11/25/16 0522  LABPROT 23.5*  INR 2.11   ABG No results for input(s): PHART, HCO3 in the last 72 hours.  Invalid input(s): PCO2, PO2  Studies/Results: No results found.  Anti-infectives: Anti-infectives    Start     Dose/Rate Route Frequency Ordered Stop   11/25/16 1400  piperacillin-tazobactam (ZOSYN) IVPB 3.375 g     3.375 g 12.5 mL/hr over 240 Minutes Intravenous Every 8 hours 11/25/16 1045     11/24/16 2330  piperacillin-tazobactam (ZOSYN) IVPB 3.375 g  Status:  Discontinued     3.375 g 12.5 mL/hr over 240 Minutes Intravenous Every 8 hours 11/24/16 1511 11/24/16 1634   11/24/16 1700  metroNIDAZOLE (FLAGYL) IVPB 500 mg  Status:  Discontinued     500 mg 100 mL/hr over 60 Minutes Intravenous Every 8 hours 11/24/16 1631 11/25/16 0847   11/24/16 1700  ciprofloxacin (CIPRO) IVPB 400 mg  Status:  Discontinued     400 mg 200 mL/hr over 60 Minutes Intravenous Every 12 hours 11/24/16 1635 11/25/16 0847   11/24/16 1530  piperacillin-tazobactam (ZOSYN) IVPB 3.375 g     3.375  g 100 mL/hr over 30 Minutes Intravenous  Once 11/24/16 1511 11/24/16 1612      Assessment/Plan: Patient Active Problem List   Diagnosis Date Noted  . Adrenal mass ([place]) 11/24/2016  . Acute Sigmoid diverticulitis 11/15/2016  . Diverticulitis 11/15/2016  . ICD (implantable cardioverter-defibrillator) lead failure 07/16/2016  . Paroxysmal atrial fibrillation ([place]) 06/26/2015  . Dyspnea 04/10/2015  . Acute respiratory failure ([place]) 03/28/2015  . Essential hypertension 03/28/2015  . Systolic CHF ([place]) 09/81/1914  . Pyrexia   . Shortness of breath   . Influenza A   . VT (ventricular tachycardia) ([place])   . ICD (implantable cardioverter-defibrillator) in place 06/08/2010  . Chronic systolic heart failure ([place]) 04/18/2010  . WEIGHT LOSS 11/01/2008  . TOBACCO ABUSE 09/06/2008  . Aneurysm of abdominal vessel (5.6 CM) 09/06/2008  . HLD (hyperlipidemia) 09/05/2008  . COUGH 04/08/2007  . PSA, INCREASED 04/08/2007  . OTHER ABNORMAL BLOOD CHEMISTRY 03/24/2007  . ADRENAL MASS 10/01/2006  . Anxiety state 10/01/2006  . HYPERTENSION 10/01/2006  . Coronary atherosclerosis 10/01/2006  . Cardiomyopathy, ischemic 10/01/2006  . COPD, severe ([place]) 10/01/2006  . HYPERGLYCEMIA 10/01/2006  . HEMATURIA, HX OF 10/01/2006    ADVANCE DIET  CONTNUE ABX   LOS: 2 days    Vicke Plotner A. 11/27/2016

## 2016-11-28 DIAGNOSIS — K5792 Diverticulitis of intestine, part unspecified, without perforation or abscess without bleeding: Secondary | ICD-10-CM

## 2016-11-28 LAB — CBC
HCT: 39 % (ref 39.0–52.0)
Hemoglobin: 12.8 g/dL — ABNORMAL LOW (ref 13.0–17.0)
MCH: 28.7 pg (ref 26.0–34.0)
MCHC: 32.8 g/dL (ref 30.0–36.0)
MCV: 87.4 fL (ref 78.0–100.0)
PLATELETS: 275 10*3/uL (ref 150–400)
RBC: 4.46 MIL/uL (ref 4.22–5.81)
RDW: 14.9 % (ref 11.5–15.5)
WBC: 10.1 10*3/uL (ref 4.0–10.5)

## 2016-11-28 LAB — BASIC METABOLIC PANEL
ANION GAP: 12 (ref 5–15)
BUN: 6 mg/dL (ref 6–20)
CALCIUM: 8.9 mg/dL (ref 8.9–10.3)
CHLORIDE: 104 mmol/L (ref 101–111)
CO2: 24 mmol/L (ref 22–32)
CREATININE: 0.82 mg/dL (ref 0.61–1.24)
Glucose, Bld: 98 mg/dL (ref 65–99)
POTASSIUM: 3.7 mmol/L (ref 3.5–5.1)
Sodium: 140 mmol/L (ref 135–145)

## 2016-11-28 LAB — APTT
APTT: 129 s — AB (ref 24–36)
APTT: 169 s — AB (ref 24–36)

## 2016-11-28 LAB — HEPARIN LEVEL (UNFRACTIONATED)
HEPARIN UNFRACTIONATED: 0.73 [IU]/mL — AB (ref 0.30–0.70)
HEPARIN UNFRACTIONATED: 0.82 [IU]/mL — AB (ref 0.30–0.70)

## 2016-11-28 MED ORDER — SACCHAROMYCES BOULARDII 250 MG PO CAPS
250.0000 mg | ORAL_CAPSULE | Freq: Two times a day (BID) | ORAL | Status: DC
  Administered 2016-11-28 – 2016-11-30 (×5): 250 mg via ORAL
  Filled 2016-11-28 (×5): qty 1

## 2016-11-28 MED ORDER — PANTOPRAZOLE SODIUM 40 MG PO TBEC
40.0000 mg | DELAYED_RELEASE_TABLET | Freq: Every day | ORAL | Status: DC
  Administered 2016-11-28 – 2016-11-29 (×2): 40 mg via ORAL
  Filled 2016-11-28 (×2): qty 1

## 2016-11-28 MED ORDER — HEPARIN (PORCINE) IN NACL 100-0.45 UNIT/ML-% IJ SOLN: 550.0000 [IU]/h | INTRAMUSCULAR | Status: DC

## 2016-11-28 MED ORDER — RIVAROXABAN 20 MG PO TABS
20.0000 mg | ORAL_TABLET | Freq: Every day | ORAL | Status: DC
  Administered 2016-11-28 – 2016-11-29 (×2): 20 mg via ORAL
  Filled 2016-11-28 (×2): qty 1

## 2016-11-28 NOTE — Progress Notes (Signed)
CRITICAL VALUE ALERT  Critical Value:  PTT 169  Date & Time Notied:  11/28/2016 1848  Provider Notified: Posey Pronto  Orders Received/Actions taken: Hold Heparin for one hour and then change rate to 5.5 mL/hr

## 2016-11-28 NOTE — Progress Notes (Signed)
Patient's blood pressure was 118/55 manually and pulse was 76; and MD was notified and verified to give carvedilol.

## 2016-11-28 NOTE — Progress Notes (Addendum)
ANTICOAGULATION CONSULT NOTE - Follow-Up Consult ANTIBIOTIC CONSULT NOTE - Follow-Up Consult  Pharmacy Consult for Heparin and Zosyn Indication: atrial fibrillation (while Xarelto on hold); diverticulitis  No Known Allergies  Patient Measurements: Height: 5\' 11"  (180.3 cm) Weight: 140 lb 9.6 oz (63.8 kg) IBW/kg (Calculated) : 75.3 Heparin Dosing Weight: 63 kg  Vital Signs: Temp: 98.2 F (36.8 C) (10/25 0459) Temp Source: Tympanic (10/25 0459) BP: 146/69 (10/25 1000) Pulse Rate: 68 (10/25 1000)  Labs:  Recent Labs  11/26/16 0515 11/27/16 0520 11/27/16 1206 11/27/16 2136 11/28/16 0802  HGB 12.6* 12.3*  --   --  12.8*  HCT 38.7* 36.8*  --   --  39.0  PLT 310 279  --   --  275  APTT  --   --  33 116* 129*  HEPARINUNFRC  --   --  0.15* 0.61 0.73*  CREATININE 0.90 0.78  --   --  0.82    Estimated Creatinine Clearance: 72.4 mL/min (by C-G formula based on SCr of 0.82 mg/dL).   Medical History: Past Medical History:  Diagnosis Date  . Adrenal mass ([place])    per pt this is remote (10 years) and benign by biopsy  . AICD (automatic cardioverter/defibrillator) present   . Anxiety   . CAD (coronary artery disease)    a. s/p PCI to LAD 2001 b. myoview 2014 high risk with scar LAD/RCA territory but no ischemia  . Cardiomyopathy, ischemic   . CHF (congestive heart failure) ([name])    class II/III  . COPD (chronic obstructive pulmonary disease) ([name])    smokes cigars but has quit cigarettes  . Flu 03/2015  . HTN (hypertension)   . Hyperlipidemia   . Paroxysmal atrial fibrillation ([name]) 03/2015   chads2vasc score is 4  . PSA (psoriatic arthritis) ([place])    increased  . PVD (peripheral vascular disease) ([name])     Medications:  Scheduled:  . atorvastatin  80 mg Oral q1800  . carvedilol  25 mg Oral BID WC  . furosemide  20 mg Oral Daily  . lactobacillus acidophilus  2 tablet Oral TID  . lisinopril  40 mg Oral Daily  . mometasone-formoterol  2 puff Inhalation BID  .  pantoprazole (PROTONIX) IV  40 mg Intravenous QHS  . potassium chloride SA  40 mEq Oral Daily  . saccharomyces boulardii  250 mg Oral BID   Infusions:  . heparin 750 Units/hr (11/27/16 2353)  . piperacillin-tazobactam (ZOSYN)  IV Stopped (11/28/16 1011)    Assessment: 71 yo M admitted with diverticulitis.  CT showed abscess but not amenable to drain placement.  Pt started on IV Zosyn and is clinically improving.  Plan to continue through 10/28 for 7d course.  10/21 Zosyn x 1 Zosyn 10 /22 > Cipro 10/21 > 10/22 Flagyl 10/21 > 10/22  PTA pt was on Xarelto for a hx of afib.  This has been held since admission in the event that surgical intervention was needed.  Currently no plans for surgery.  Last Xarelto dose 10/20.    Heparin level and aPTT remain slightly above goal despite rate decrease.  No bleeding noted.  Goal of Therapy:  Heparin level 0.3-0.7 units/ml aPTT 66-102 seconds Monitor platelets by anticoagulation protocol: Yes   Plan:  Continue Zosyn 3.375 gm IV q8h (4 hour infusion).  Reduce heparin to 650 units/hr  Heparin level and aPTT in 6 hours. Follow-up plans to restart oral anticoagulation.  [employer], Pharm.D., BCPS Clinical Pharmacist Pager: [phone] Clinical  phone for 11/28/2016 from 8:30-4:00 is [phone]. After 4pm, please call Main [facility] [phone]) for assistance. 11/28/2016 11:41 AM

## 2016-11-28 NOTE — Progress Notes (Signed)
[facility] Progress Note     Subjective: CC:  Abdominal pain continues to improve- still with mild LLQ "soreness" that is not worsened by PO intake. Worse with palpation. Tolerating PO but reports he is "not a big eater" and has not been eating much. Having loose, non-bloody BMs and flatus. Patient states he was not diagnosed with diverticulitis until his hospital admission this month.  Afebrile, WBC 10.1  Objective: Vital signs in last 24 hours: Temp:  [98.2 F (36.8 C)-98.5 F (36.9 C)] 98.2 F (36.8 C) (10/25 0459) Pulse Rate:  [70-78] 70 (10/25 0459) Resp:  [18-20] 18 (10/25 0459) BP: (111-158)/(60-69) 158/69 (10/25 0459) SpO2:  [92 %-96 %] 96 % (10/25 0459) Weight:  [63.8 kg (140 lb 9.6 oz)] 63.8 kg (140 lb 9.6 oz) (10/25 0500) Last BM Date: 11/27/16  Intake/Output from previous day: 10/24 0701 - 10/25 0700 In: 746.3 [P.O.:346; I.V.:150.3; IV Piggyback:250] Out: -  Intake/Output this shift: No intake/output data recorded.  PE: Gen:  Alert, NAD, pleasant Card:  Regular rate and rhythm, pedal pulses 2+ BL Pulm:  Normal effort, clear to auscultation bilaterally Abd: Soft, mild TTP LLQ without rebound or guarding, non-distended, bowel sounds present in all 4 quadrants Skin: warm and dry, no rashes  Psych: A&Ox3   Lab Results:   Recent Labs  11/26/16 0515 11/27/16 0520  WBC 20.0* 11.9*  HGB 12.6* 12.3*  HCT 38.7* 36.8*  PLT 310 279   BMET  Recent Labs  11/26/16 0515 11/27/16 0520  NA 136 139  K 3.7 3.1*  CL 100* 100*  CO2 24 25  GLUCOSE 81 96  BUN 6 8  CREATININE 0.90 0.78  CALCIUM 9.1 8.8*   PT/INR No results for input(s): LABPROT, INR in the last 72 hours. CMP     Component Value Date/Time   NA 139 11/27/2016 0520   K 3.1 (L) 11/27/2016 0520   CL 100 (L) 11/27/2016 0520   CO2 25 11/27/2016 0520   GLUCOSE 96 11/27/2016 0520   BUN 8 11/27/2016 0520   CREATININE 0.78 11/27/2016 0520   CREATININE 0.88 09/15/2015 0959   CALCIUM  8.8 (L) 11/27/2016 0520   PROT 6.7 11/25/2016 0522   ALBUMIN 2.7 (L) 11/25/2016 0522   AST 15 11/25/2016 0522   ALT 12 (L) 11/25/2016 0522   ALKPHOS 53 11/25/2016 0522   BILITOT 0.8 11/25/2016 0522   GFRNONAA >60 11/27/2016 0520   GFRAA >60 11/27/2016 0520   Lipase     Component Value Date/Time   LIPASE 26 11/24/2016 1038       Studies/Results: No results found.  Anti-infectives: Anti-infectives    Start     Dose/Rate Route Frequency Ordered Stop   11/25/16 1400  piperacillin-tazobactam (ZOSYN) IVPB 3.375 g     3.375 g 12.5 mL/hr over 240 Minutes Intravenous Every 8 hours 11/25/16 1045     11/24/16 2330  piperacillin-tazobactam (ZOSYN) IVPB 3.375 g  Status:  Discontinued     3.375 g 12.5 mL/hr over 240 Minutes Intravenous Every 8 hours 11/24/16 1511 11/24/16 1634   11/24/16 1700  metroNIDAZOLE (FLAGYL) IVPB 500 mg  Status:  Discontinued     500 mg 100 mL/hr over 60 Minutes Intravenous Every 8 hours 11/24/16 1631 11/25/16 0847   11/24/16 1700  ciprofloxacin (CIPRO) IVPB 400 mg  Status:  Discontinued     400 mg 200 mL/hr over 60 Minutes Intravenous Every 12 hours 11/24/16 1635 11/25/16 0847   11/24/16 1530  piperacillin-tazobactam (ZOSYN) IVPB 3.375  g     3.375 g 100 mL/hr over 30 Minutes Intravenous  Once 11/24/16 1511 11/24/16 1612     Assessment/Plan Sigmoid diverticulitis with microperforation and phlegmon   - admitted 10/12-10/15 for management of acute diverticulitis with IV abx. D/C-ed home on PO abx  - readmitted 10/21 with diarrhea and worsening LLQ pain and CT revealed microperf/worsening phlegmon   - Clinically improving non-operatively with IV abx and WBC normal  - having bowel function and tolerating full liquids without exacerbation/recurrence of pain   Plan: advance diet to soft. Stable for transition to PO abx followed by hospital discharge.  No role for surgical follow up after this hospital admission. If diverticulitis recurs, would consider surgical  referral to discuss elective sigmoid colectomy.   LOS: 3 days    [facility] 11/28/2016, 8:29 AM Pager: [phone] Consults: [phone] Mon-Fri 7:00 am-4:30 pm Sat-Sun 7:00 am-11:30 am

## 2016-11-28 NOTE — Progress Notes (Signed)
PROGRESS NOTE    Benjamin Cook  BMW:[id number] DOB: 12/12/1945 DOA: 11/24/2016 PCP: Renato Shin, MD   Outpatient Specialists:     Brief Narrative:  Benjamin Cook is a 71 y.o. male with medical history significant for ischemic cardiomyopathy, last known EF 35%, history ofabdominal aortic aneurysm, tobacco abuse, COPD, and recent admission for diverticulitis from 1013 through 11/18/2016 treated with IV fluids, IV Zosyn, discharged on Augmentin-- He says he finished the course of abx.. Not a good surgical candidate so will treat with 7 days of IV abx.     Assessment & Plan:   Active Problems:   HLD (hyperlipidemia)   Anxiety state   TOBACCO ABUSE   Cardiomyopathy, ischemic   Aneurysm of abdominal vessel (5.6 CM)   COPD, severe ([name])   HYPERGLYCEMIA   PSA, INCREASED   Chronic systolic heart failure ([name])   ICD (implantable cardioverter-defibrillator) in place   Essential hypertension   Acute Sigmoid diverticulitis   Diverticulitis   Adrenal mass ([place])   Sigmoid diverticulitis, non resolved from prior admission, worsening symptoms. .CT abdomen and pelvis shows increasing inflammatory changes from recent acute sigmoid diverticulitis, now with new 5.5 x 7.6 x 5 cm ill-defined phlegmon with foci of free air adjacent to the sigmoid colon. No discrete abscess identified. No evidence of bowel obstruction.(seen by IR and no window opportunity for drainage) -advance diet per surgery -zosyn- suspect will need IV abx x 7 days-- failed PO abx- stop date Sunday -will need close monitoring in the hospital Protonix 40 mg IV every 24 hours. Analgesics and antiemetics as needed. Appreciate general surgery input.  Would like to switch the patient to p.o.  Will discuss with ID tomorrow.  Chronic systolic  CHF  03/2015 EF 30-35 percent, mod to severely reduced systolic, Gr 1 DD. Weight 150. No acute decompensation Switching from heparin to Xarelto again.   Tobacco abuse without  nicotine withdrawal/ COPD  -  Nicotine patch if needed, he declined at this time  -  Counseled cessation Continue inhalers prn   History of AAA, now enlarging per last CT abdomen.  5.5 cm infrarenal suprailiac abdominal aortic aneurysm, unchanged from 11/15/2016, but significantly increased from 2005 Vascular surgery to operate once diverticulitis is resolved  probably will follow up with vascular surgery in 2 weeks instead of 1 week.  Hypertension BP 132/72   Pulse 80  Continue home anti-hypertensive medications   Hyperlipidemia Continue home statins  Hypokalemia/hupomagnesemia -replete   DVT prophylaxis:  heparin   Code Status:  Full Code   Family Communication: At bedside  Disposition Plan:     Consultants:   [facility]  cardiology  Procedures:    Subjective: Continues to have multiple loose BMs.  No nausea no vomiting.  Objective: Vitals:   11/28/16 0910 11/28/16 1000 11/28/16 1419 11/28/16 1706  BP:  (!) 146/69 137/78 (!) 118/55  Pulse:  68 66 76  Resp:   18   Temp:   (!) 97.5 F (36.4 C)   TempSrc:   Oral   SpO2: 98%  97%   Weight:      Height:        Intake/Output Summary (Last 24 hours) at 11/28/16 2008 Last data filed at 11/28/16 1946  Gross per 24 hour  Intake          12 40.93 ml  Output                0 ml  Net  1240.93 ml   Filed Weights   11/25/16 0540 11/26/16 0530 11/28/16 0500  Weight: 68.1 kg (150 lb 3.2 oz) 63.5 kg (140 lb 1.6 oz) 63.8 kg (140 lb 9.6 oz)    Examination:  General exam: in bed, NAD Respiratory system: clear Cardiovascular system: rrr Gastrointestinal system: +BS, soft, non tender Central nervous system: A+Ox3 Psychiatry: normal mood     Data Reviewed: I have personally reviewed following labs and imaging studies  CBC:  Recent Labs Lab 11/24/16 1038 11/25/16 0522 11/26/16 0515 11/27/16 0520 11/28/16 0802  WBC 17.1* 16.9* 20.0* 11.9* 10.1  HGB 14.0 12.2* 12.6* 12.3* 12.8*  HCT 41.0  37.4* 38.7* 36.8* 39.0  MCV 88.4 89.3 89.4 87.8 87.4  PLT 300 269 310 279 962   Basic Metabolic Panel:  Recent Labs Lab 11/24/16 1038 11/25/16 0522 11/26/16 0515 11/27/16 0520 11/28/16 0802  NA 135 134* 136 139 140  K 3.9 3.8 3.7 3.1* 3.7  CL 102 103 100* 100* 104  CO2 28 23 24 25 24   GLUCOSE 111* 118* 81 96 98  BUN 7 7 6 8 6   CREATININE 0.81 0.79 0.90 0.78 0.82  CALCIUM 9.2 8.5* 9.1 8.8* 8.9  MG  --   --   --  1.8  --    GFR: Estimated Creatinine Clearance: 72.4 mL/min (by C-G formula based on SCr of 0.82 mg/dL). Liver Function Tests:  Recent Labs Lab 11/24/16 1038 11/25/16 0522  AST 20 15  ALT 14* 12*  ALKPHOS 65 53  BILITOT 0.6 0.8  PROT 7.9 6.7  ALBUMIN 3.1* 2.7*    Recent Labs Lab 11/24/16 1038  LIPASE 26   No results for input(s): AMMONIA in the last 168 hours. Coagulation Profile:  Recent Labs Lab 11/25/16 0522  INR 2.11   Cardiac Enzymes: No results for input(s): CKTOTAL, CKMB, CKMBINDEX, TROPONINI in the last 168 hours. BNP (last 3 results) No results for input(s): PROBNP in the last 8760 hours. HbA1C: No results for input(s): HGBA1C in the last 72 hours. CBG:  Recent Labs Lab 11/26/16 0747 11/26/16 1207 11/26/16 1650  GLUCAP 86 99 117*   Lipid Profile: No results for input(s): CHOL, HDL, LDLCALC, TRIG, CHOLHDL, LDLDIRECT in the last 72 hours. Thyroid Function Tests: No results for input(s): TSH, T4TOTAL, FREET4, T3FREE, THYROIDAB in the last 72 hours. Anemia Panel: No results for input(s): VITAMINB12, FOLATE, FERRITIN, TIBC, IRON, RETICCTPCT in the last 72 hours. Urine analysis:    Component Value Date/Time   COLORURINE YELLOW 11/24/2016 1038   APPEARANCEUR TURBID (A) 11/24/2016 1038   LABSPEC 1.025 11/24/2016 1038   PHURINE 5.0 11/24/2016 1038   GLUCOSEU NEGATIVE 11/24/2016 1038   GLUCOSEU NEGATIVE 04/10/2015 1220   HGBUR MODERATE (A) 11/24/2016 1038   BILIRUBINUR NEGATIVE 11/24/2016 1038   KETONESUR NEGATIVE 11/24/2016  1038   PROTEINUR 100 (A) 11/24/2016 1038   UROBILINOGEN 0.2 04/10/2015 1220   NITRITE NEGATIVE 11/24/2016 [place] 11/24/2016 1038     ) No results found for this or any previous visit (from the past 240 hour(s)).    Anti-infectives    Start     Dose/Rate Route Frequency Ordered Stop   11/25/16 1400  piperacillin-tazobactam (ZOSYN) IVPB 3.375 g     3.375 g 12.5 mL/hr over 240 Minutes Intravenous Every 8 hours 11/25/16 1045     11/24/16 2330  piperacillin-tazobactam (ZOSYN) IVPB 3.375 g  Status:  Discontinued     3.375 g 12.5 mL/hr over 240 Minutes Intravenous Every 8  hours 11/24/16 1511 11/24/16 1634   11/24/16 1700  metroNIDAZOLE (FLAGYL) IVPB 500 mg  Status:  Discontinued     500 mg 100 mL/hr over 60 Minutes Intravenous Every 8 hours 11/24/16 1631 11/25/16 0847   11/24/16 1700  ciprofloxacin (CIPRO) IVPB 400 mg  Status:  Discontinued     400 mg 200 mL/hr over 60 Minutes Intravenous Every 12 hours 11/24/16 1635 11/25/16 0847   11/24/16 1530  piperacillin-tazobactam (ZOSYN) IVPB 3.375 g     3.375 g 100 mL/hr over 30 Minutes Intravenous  Once 11/24/16 1511 11/24/16 1612       Radiology Studies: No results found.      Scheduled Meds: . atorvastatin  80 mg Oral q1800  . carvedilol  25 mg Oral BID WC  . furosemide  20 mg Oral Daily  . lactobacillus acidophilus  2 tablet Oral TID  . lisinopril  40 mg Oral Daily  . mometasone-formoterol  2 puff Inhalation BID  . pantoprazole (PROTONIX) IV  40 mg Intravenous QHS  . potassium chloride SA  40 mEq Oral Daily  . saccharomyces boulardii  250 mg Oral BID   Continuous Infusions: . heparin    . piperacillin-tazobactam (ZOSYN)  IV 3.375 g (11/28/16 1443)     LOS: 3 days    Time spent: 75 min    Kristena Wilhelmi, DO [facility] Pager [phone]  If 7PM-7AM, please contact night-coverage [email or web address] Password [email or web address] 11/28/2016, 8:08 PM

## 2016-11-28 NOTE — Progress Notes (Addendum)
ANTICOAGULATION CONSULT NOTE - Follow Up Consult  Pharmacy Consult for Heparin Indication: atrial fibrillation (while Xarelto on hold)  No Known Allergies  Patient Measurements: Height: 5\' 11"  (180.3 cm) Weight: 140 lb 9.6 oz (63.8 kg) IBW/kg (Calculated) : 75.3 Heparin Dosing Weight: 63 kg  Vital Signs: Temp: 97.5 F (36.4 C) (10/25 1419) Temp Source: Oral (10/25 1419) BP: 118/55 (10/25 1706) Pulse Rate: 76 (10/25 1706)  Labs:  Recent Labs  11/26/16 0515 11/27/16 0520  11/27/16 2136 11/28/16 0802 11/28/16 1725  HGB 12.6* 12.3*  --   --  12.8*  --   HCT 38.7* 36.8*  --   --  39.0  --   PLT 310 279  --   --  275  --   APTT  --   --   < > 116* 129* 169*  HEPARINUNFRC  --   --   < > 0.61 0.73* 0.82*  CREATININE 0.90 0.78  --   --  0.82  --   < > = values in this interval not displayed.  Estimated Creatinine Clearance: 72.4 mL/min (by C-G formula based on SCr of 0.82 mg/dL).   Medical History: Past Medical History:  Diagnosis Date  . Adrenal mass ([place])    per pt this is remote (10 years) and benign by biopsy  . AICD (automatic cardioverter/defibrillator) present   . Anxiety   . CAD (coronary artery disease)    a. s/p PCI to LAD 2001 b. myoview 2014 high risk with scar LAD/RCA territory but no ischemia  . Cardiomyopathy, ischemic   . CHF (congestive heart failure) ([name])    class II/III  . COPD (chronic obstructive pulmonary disease) ([name])    smokes cigars but has quit cigarettes  . Flu 03/2015  . HTN (hypertension)   . Hyperlipidemia   . Paroxysmal atrial fibrillation ([name]) 03/2015   chads2vasc score is 4  . PSA (psoriatic arthritis) ([place])    increased  . PVD (peripheral vascular disease) ([name])     Medications:  Scheduled:  . atorvastatin  80 mg Oral q1800  . carvedilol  25 mg Oral BID WC  . furosemide  20 mg Oral Daily  . lactobacillus acidophilus  2 tablet Oral TID  . lisinopril  40 mg Oral Daily  . mometasone-formoterol  2 puff Inhalation BID  .  pantoprazole (PROTONIX) IV  40 mg Intravenous QHS  . potassium chloride SA  40 mEq Oral Daily  . saccharomyces boulardii  250 mg Oral BID   Infusions:  . heparin    . piperacillin-tazobactam (ZOSYN)  IV 3.375 g (11/28/16 1443)    Assessment: 71 yo M admitted with diverticulitis.  CT showed abscess but not amenable to drain placement.  Pt started on IV Zosyn and is clinically improving.  PTA pt was on Xarelto for a hx of afib.  This has been held since admission in the event that surgical intervention was needed.  Last Xarelto dose 10/20.  Heparin drip started.  Heparin drip 650 uts/hr HL elevated 0.8, aptt elevated 169 sec. Confirmed with patient and RN levels drawn from opposite arm as heparin infusing.  No bleeding noted.  Goal of Therapy:  Heparin level 0.3-0.7 units/ml aPTT 66-102 seconds Monitor platelets by anticoagulation protocol: Yes   Plan:  Hold heparin drip x1hr Restart at 550 uts/hr  Daily HL, aPTT, CBC  Bonnita Nasuti Pharm.D. CPP, BCPS Clinical Pharmacist [phone] 11/28/2016 7:20 PM    Addendum Stop Heparin > restart rivaroxaban 20mg  daily  Lattie Haw  Pablo Lawrence.D. CPP, BCPS Clinical Pharmacist [phone] 11/28/2016 8:26 PM

## 2016-11-29 LAB — CBC
HEMATOCRIT: 35.2 % — AB (ref 39.0–52.0)
HEMOGLOBIN: 11.8 g/dL — AB (ref 13.0–17.0)
MCH: 29.6 pg (ref 26.0–34.0)
MCHC: 33.5 g/dL (ref 30.0–36.0)
MCV: 88.2 fL (ref 78.0–100.0)
Platelets: 255 10*3/uL (ref 150–400)
RBC: 3.99 MIL/uL — AB (ref 4.22–5.81)
RDW: 14.8 % (ref 11.5–15.5)
WBC: 9 10*3/uL (ref 4.0–10.5)

## 2016-11-29 LAB — BASIC METABOLIC PANEL
ANION GAP: 10 (ref 5–15)
BUN: 5 mg/dL — ABNORMAL LOW (ref 6–20)
CHLORIDE: 107 mmol/L (ref 101–111)
CO2: 23 mmol/L (ref 22–32)
CREATININE: 0.83 mg/dL (ref 0.61–1.24)
Calcium: 8.9 mg/dL (ref 8.9–10.3)
GFR calc non Af Amer: >60 mL/min (ref >60)
Glucose, Bld: 98 mg/dL (ref 65–99)
POTASSIUM: 3.6 mmol/L (ref 3.5–5.1)
Sodium: 140 mmol/L (ref 135–145)

## 2016-11-29 LAB — MAGNESIUM: MAGNESIUM: 1.9 mg/dL (ref 1.7–2.4)

## 2016-11-29 MED ORDER — CIPROFLOXACIN HCL 500 MG PO TABS
500.0000 mg | ORAL_TABLET | Freq: Two times a day (BID) | ORAL | Status: DC
  Administered 2016-11-29 – 2016-11-30 (×2): 500 mg via ORAL
  Filled 2016-11-29 (×2): qty 1

## 2016-11-29 MED ORDER — METRONIDAZOLE 500 MG PO TABS
500.0000 mg | ORAL_TABLET | Freq: Three times a day (TID) | ORAL | Status: DC
  Administered 2016-11-29 – 2016-11-30 (×2): 500 mg via ORAL
  Filled 2016-11-29 (×2): qty 1

## 2016-11-29 NOTE — Progress Notes (Signed)
PROGRESS NOTE    Benjamin Cook  CZY:[id number] DOB: 07/20/1945 DOA: 11/24/2016 PCP: Renato Shin, MD   Outpatient Specialists:     Brief Narrative:  Benjamin Cook is a 71 y.o. male with medical history significant for ischemic cardiomyopathy, last known EF 35%, history ofabdominal aortic aneurysm, tobacco abuse, COPD, and recent admission for diverticulitis from 1013 through 11/18/2016 treated with IV fluids, IV Zosyn, discharged on Augmentin-- He says he finished the course of abx.. Not a good surgical candidate so will treat with 7 days of IV abx.     Assessment & Plan:   Active Problems:   HLD (hyperlipidemia)   Anxiety state   TOBACCO ABUSE   Cardiomyopathy, ischemic   Aneurysm of abdominal vessel (5.6 CM)   COPD, severe ([name])   HYPERGLYCEMIA   PSA, INCREASED   Chronic systolic heart failure ([name])   ICD (implantable cardioverter-defibrillator) in place   Essential hypertension   Acute Sigmoid diverticulitis   Diverticulitis   Adrenal mass ([place])   Sigmoid diverticulitis, non resolved from prior admission, worsening symptoms. .CT abdomen and pelvis shows increasing inflammatory changes from recent acute sigmoid diverticulitis, now with new 5.5 x 7.6 x 5 cm ill-defined phlegmon with foci of free air adjacent to the sigmoid colon. No discrete abscess identified. No evidence of bowel obstruction.(seen by IR and no window opportunity for drainage) -advance diet per surgery -zosyn- suspect will need IV abx x 7 days-- failed PO abx- stop date Sunday -will need close monitoring in the hospital Protonix 40 mg IV every 24 hours. Analgesics and antiemetics as needed. Appreciate general surgery input.  Would like to switch the patient to p.o.   Discussed with ID, recommend to switch to Cipro Flagyl will monitor overnight after changing.  Chronic systolic  CHF  0/9323 EF 55-73 percent, mod to severely reduced systolic, Gr 1 DD. Weight 150. No acute decompensation Switching  from heparin to Xarelto again.   Tobacco abuse without nicotine withdrawal/ COPD  -  Nicotine patch if needed, he declined at this time  -  Counseled cessation Continue inhalers prn   History of AAA, now enlarging per last CT abdomen.  5.5 cm infrarenal suprailiac abdominal aortic aneurysm, unchanged from 11/15/2016, but significantly increased from 2005 Vascular surgery to operate once diverticulitis is resolved  probably will follow up with vascular surgery in 2 weeks instead of 1 week.  Hypertension BP 132/72   Pulse 80  Continue home anti-hypertensive medications   Hyperlipidemia Continue home statins  Hypokalemia/hupomagnesemia -replete   DVT prophylaxis:  heparin   Code Status:  Full Code   Family Communication: At bedside  Disposition Plan:     Consultants:   [place]  cardiology  Procedures:    Subjective: Diarrhea frequency is reducing as well as consistency is becoming thicker.  Denies any abdominal pain no nausea or vomiting.  Objective: Vitals:   11/29/16 0603 11/29/16 0846 11/29/16 0940 11/29/16 1547  BP:   (!) 150/76 (!) 141/49  Pulse:   69 62  Resp:   18 18  Temp:   98.4 F (36.9 C) (!) 97.3 F (36.3 C)  TempSrc:   Oral Oral  SpO2:  95% 98% 98%  Weight: 62.8 kg (138 lb 6.4 oz)     Height:        Intake/Output Summary (Last 24 hours) at 11/29/16 1907 Last data filed at 11/29/16 1800  Gross per 24 hour  Intake  700 ml  Output                0 ml  Net              700 ml   Filed Weights   11/26/16 0530 11/28/16 0500 11/29/16 0603  Weight: 63.5 kg (140 lb 1.6 oz) 63.8 kg (140 lb 9.6 oz) 62.8 kg (138 lb 6.4 oz)    Examination:  General exam: in bed, NAD Respiratory system: clear Cardiovascular system: rrr Gastrointestinal system: +BS, soft, non tender Central nervous system: A+Ox3 Psychiatry: normal mood     Data Reviewed: I have personally reviewed following labs and imaging studies  CBC:  Recent  Labs Lab 11/25/16 0522 11/26/16 0515 11/27/16 0520 11/28/16 0802 11/29/16 0635  WBC 16.9* 20.0* 11.9* 10.1 9.0  HGB 12.2* 12.6* 12.3* 12.8* 11.8*  HCT 37.4* 38.7* 36.8* 39.0 35.2*  MCV 89.3 89.4 87.8 87.4 88.2  PLT 269 310 279 275 355   Basic Metabolic Panel:  Recent Labs Lab 11/25/16 0522 11/26/16 0515 11/27/16 0520 11/28/16 0802 11/29/16 0635  NA 134* 136 139 140 140  K 3.8 3.7 3.1* 3.7 3.6  CL 103 100* 100* 104 107  CO2 23 24 25 24 23   GLUCOSE 118* 81 96 98 98  BUN 7 6 8 6  5*  CREATININE 0.79 0.90 0.78 0.82 0.83  CALCIUM 8.5* 9.1 8.8* 8.9 8.9  MG  --   --  1.8  --  1.9   GFR: Estimated Creatinine Clearance: 70.4 mL/min (by C-G formula based on SCr of 0.83 mg/dL). Liver Function Tests:  Recent Labs Lab 11/24/16 1038 11/25/16 0522  AST 20 15  ALT 14* 12*  ALKPHOS 65 53  BILITOT 0.6 0.8  PROT 7.9 6.7  ALBUMIN 3.1* 2.7*    Recent Labs Lab 11/24/16 1038  LIPASE 26   No results for input(s): AMMONIA in the last 168 hours. Coagulation Profile:  Recent Labs Lab 11/25/16 0522  INR 2.11   Cardiac Enzymes: No results for input(s): CKTOTAL, CKMB, CKMBINDEX, TROPONINI in the last 168 hours. BNP (last 3 results) No results for input(s): PROBNP in the last 8760 hours. HbA1C: No results for input(s): HGBA1C in the last 72 hours. CBG:  Recent Labs Lab 11/26/16 0747 11/26/16 1207 11/26/16 1650  GLUCAP 86 99 117*   Lipid Profile: No results for input(s): CHOL, HDL, LDLCALC, TRIG, CHOLHDL, LDLDIRECT in the last 72 hours. Thyroid Function Tests: No results for input(s): TSH, T4TOTAL, FREET4, T3FREE, THYROIDAB in the last 72 hours. Anemia Panel: No results for input(s): VITAMINB12, FOLATE, FERRITIN, TIBC, IRON, RETICCTPCT in the last 72 hours. Urine analysis:    Component Value Date/Time   COLORURINE YELLOW 11/24/2016 1038   APPEARANCEUR TURBID (A) 11/24/2016 1038   LABSPEC 1.025 11/24/2016 1038   PHURINE 5.0 11/24/2016 1038   GLUCOSEU NEGATIVE  11/24/2016 1038   GLUCOSEU NEGATIVE 04/10/2015 1220   HGBUR MODERATE (A) 11/24/2016 1038   BILIRUBINUR NEGATIVE 11/24/2016 1038   KETONESUR NEGATIVE 11/24/2016 1038   PROTEINUR 100 (A) 11/24/2016 1038   UROBILINOGEN 0.2 04/10/2015 1220   NITRITE NEGATIVE 11/24/2016 [place] 11/24/2016 1038     ) No results found for this or any previous visit (from the past 240 hour(s)).    Anti-infectives    Start     Dose/Rate Route Frequency Ordered Stop   11/29/16 2000  ciprofloxacin (CIPRO) tablet 500 mg     500 mg Oral 2 times daily 11/29/16 1558  11/29/16 1800  metroNIDAZOLE (FLAGYL) tablet 500 mg     500 mg Oral Every 8 hours 11/29/16 1534     11/25/16 1400  piperacillin-tazobactam (ZOSYN) IVPB 3.375 g  Status:  Discontinued     3.375 g 12.5 mL/hr over 240 Minutes Intravenous Every 8 hours 11/25/16 1045 11/29/16 1533   11/24/16 2330  piperacillin-tazobactam (ZOSYN) IVPB 3.375 g  Status:  Discontinued     3.375 g 12.5 mL/hr over 240 Minutes Intravenous Every 8 hours 11/24/16 1511 11/24/16 1634   11/24/16 1700  metroNIDAZOLE (FLAGYL) IVPB 500 mg  Status:  Discontinued     500 mg 100 mL/hr over 60 Minutes Intravenous Every 8 hours 11/24/16 1631 11/25/16 0847   11/24/16 1700  ciprofloxacin (CIPRO) IVPB 400 mg  Status:  Discontinued     400 mg 200 mL/hr over 60 Minutes Intravenous Every 12 hours 11/24/16 1635 11/25/16 0847   11/24/16 1530  piperacillin-tazobactam (ZOSYN) IVPB 3.375 g     3.375 g 100 mL/hr over 30 Minutes Intravenous  Once 11/24/16 1511 11/24/16 1612       Radiology Studies: No results found.      Scheduled Meds: . atorvastatin  80 mg Oral q1800  . carvedilol  25 mg Oral BID WC  . ciprofloxacin  500 mg Oral BID  . furosemide  20 mg Oral Daily  . lactobacillus acidophilus  2 tablet Oral TID  . lisinopril  40 mg Oral Daily  . metroNIDAZOLE  500 mg Oral Q8H  . mometasone-formoterol  2 puff Inhalation BID  . pantoprazole  40 mg Oral QHS    . potassium chloride SA  40 mEq Oral Daily  . rivaroxaban  20 mg Oral Q supper  . saccharomyces boulardii  250 mg Oral BID   Continuous Infusions:    LOS: 4 days    Time spent: 25 min    Author:  Berle Mull, MD [facility] Pager: [phone] 11/29/2016 7:10 PM     If 7PM-7AM, please contact night-coverage [email or web address] Password [email or web address] 11/29/2016, 7:07 PM

## 2016-11-29 NOTE — Discharge Instructions (Addendum)
Diverticulitis Follow these instructions at home:  Follow your health care providers instructions carefully.  Follow a full liquid diet or other diet as directed by your health care provider. After your symptoms improve, your health care provider may tell you to change your diet. He or she may recommend you eat a high-fiber diet. Fruits and vegetables are good sources of fiber. Fiber makes it easier to pass stool.  Take fiber supplements or probiotics as directed by your health care provider.  Only take medicines as directed by your health care provider.  Keep all your follow-up appointments. Contact a health care provider if:  Your pain does not improve.  You have a hard time eating food.  Your bowel movements do not return to normal. Get help right away if:  Your pain becomes worse.  Your symptoms do not get better.  Your symptoms suddenly get worse.  You have a fever.  You have repeated vomiting.  You have bloody or black, tarry stools. This information is not intended to replace advice given to you by your health care provider. Make sure you discuss any questions you have with your health care provider. Document Released: 10/31/2004 Document Revised: 06/29/2015 Document Reviewed: 12/16/2012 Elsevier Interactive Patient Education  2017 [place] on my medicine - XARELTO (Rivaroxaban)  This medication education was reviewed with me or my healthcare representative as part of my discharge preparation.  The pharmacist that spoke with me during my hospital stay was:  Arty Baumgartner, [facility]  Why was Xarelto prescribed for you? Xarelto was prescribed for you to reduce the risk of a blood clot forming that can cause a stroke if you have a medical condition called atrial fibrillation (a type of irregular heartbeat).  What do you need to know about xarelto ? Take your Xarelto ONCE DAILY at the same time every day with your evening meal. If you  have difficulty swallowing the tablet whole, you may crush it and mix in applesauce just prior to taking your dose.  Take Xarelto exactly as prescribed by your doctor and DO NOT stop taking Xarelto without talking to the doctor who prescribed the medication.  Stopping without other stroke prevention medication to take the place of Xarelto may increase your risk of developing a clot that causes a stroke.  Refill your prescription before you run out.  After discharge, you should have regular check-up appointments with your healthcare provider that is prescribing your Xarelto.  In the future your dose may need to be changed if your kidney function or weight changes by a significant amount.  What do you do if you miss a dose? If you are taking Xarelto ONCE DAILY and you miss a dose, take it as soon as you remember on the same day then continue your regularly scheduled once daily regimen the next day. Do not take two doses of Xarelto at the same time or on the same day.   Important Safety Information A possible side effect of Xarelto is bleeding. You should call your healthcare provider right away if you experience any of the following: ? Bleeding from an injury or your nose that does not stop. ? Unusual colored urine (red or dark brown) or unusual colored stools (red or black). ? Unusual bruising for unknown reasons. ? A serious fall or if you hit your head (even if there is no bleeding).  Some medicines may interact with Xarelto and might increase your risk of bleeding while on Xarelto. To  help avoid this, consult your healthcare provider or pharmacist prior to using any new prescription or non-prescription medications, including herbals, vitamins, non-steroidal anti-inflammatory drugs (NSAIDs) and supplements.  This website has more information on Xarelto: [email or web address]

## 2016-11-29 NOTE — Progress Notes (Signed)
ANTIBIOTIC CONSULT NOTE - Follow-Up Consult  Pharmacy Consult for  cirpo Indication:diverticulitis  No Known Allergies  Patient Measurements: Height: 5\' 11"  (180.3 cm) Weight: 138 lb 6.4 oz (62.8 kg) IBW/kg (Calculated) : 75.3 Heparin Dosing Weight: 63 kg  Vital Signs: Temp: 97.3 F (36.3 C) (10/26 1547) Temp Source: Oral (10/26 1547) BP: 141/49 (10/26 1547) Pulse Rate: 62 (10/26 1547)  Labs:  Recent Labs  11/27/16 0520  11/27/16 2136 11/28/16 0802 11/28/16 1725 11/29/16 0635  HGB 12.3*  --   --  12.8*  --  11.8*  HCT 36.8*  --   --  39.0  --  35.2*  PLT 279  --   --  275  --  255  APTT  --   < > 116* 129* 169*  --   HEPARINUNFRC  --   < > 0.61 0.73* 0.82*  --   CREATININE 0.78  --   --  0.82  --  0.83  < > = values in this interval not displayed.  Estimated Creatinine Clearance: 70.4 mL/min (by C-G formula based on SCr of 0.83 mg/dL).   Medical History: Past Medical History:  Diagnosis Date  . Adrenal mass ([place])    per pt this is remote (10 years) and benign by biopsy  . AICD (automatic cardioverter/defibrillator) present   . Anxiety   . CAD (coronary artery disease)    a. s/p PCI to LAD 2001 b. myoview 2014 high risk with scar LAD/RCA territory but no ischemia  . Cardiomyopathy, ischemic   . CHF (congestive heart failure) ([name])    class II/III  . COPD (chronic obstructive pulmonary disease) ([name])    smokes cigars but has quit cigarettes  . Flu 03/2015  . HTN (hypertension)   . Hyperlipidemia   . Paroxysmal atrial fibrillation ([name]) 03/2015   chads2vasc score is 4  . PSA (psoriatic arthritis) ([place])    increased  . PVD (peripheral vascular disease) ([name])     Medications:  Scheduled:  . atorvastatin  80 mg Oral q1800  . carvedilol  25 mg Oral BID WC  . furosemide  20 mg Oral Daily  . lactobacillus acidophilus  2 tablet Oral TID  . lisinopril  40 mg Oral Daily  . metroNIDAZOLE  500 mg Oral Q8H  . mometasone-formoterol  2 puff Inhalation BID  .  pantoprazole  40 mg Oral QHS  . potassium chloride SA  40 mEq Oral Daily  . rivaroxaban  20 mg Oral Q supper  . saccharomyces boulardii  250 mg Oral BID    Assessment: 71 yo M admitted with diverticulitis.  CT showed abscess but not amenable to drain placement.  Pt started on IV Zosyn and is clinically improving.  Plan to continue through 10/28 for 7d course. Pharmacy now consulted to dose cipro (he is noted on po flagyl)  10/21 Zosyn x 1 Zosyn 10 /22 > 10/16 Cipro 10/21 > 10/22; 10/26>> Flagyl 10/21 > 10/22; 10/26>>    Plan:  -Cipro 500 mg po bid -Consider adding stop dates for 10/28 -Will sign off. Please contact pharmacy with any other needs.  Thank you Hildred Laser, Pharm D 11/29/2016 3:57 PM

## 2016-11-29 NOTE — Progress Notes (Signed)
[facility] Progress Note     Subjective: CC:  No complaints. Pain improved. Tolerating SOFT diet. Having loose BMs.  Objective: Vital signs in last 24 hours: Temp:  [97.5 F (36.4 C)-98.3 F (36.8 C)] 98.3 F (36.8 C) (10/26 0443) Pulse Rate:  [63-76] 63 (10/26 0443) Resp:  [18] 18 (10/26 0443) BP: (118-146)/(55-78) 132/64 (10/26 0443) SpO2:  [96 %-100 %] 100 % (10/26 0443) Weight:  [62.8 kg (138 lb 6.4 oz)] 62.8 kg (138 lb 6.4 oz) (10/26 0603) Last BM Date: 11/28/16  Intake/Output from previous day: 10/25 0701 - 10/26 0700 In: 1054.5 [P.O.:840; I.V.:64.5; IV Piggyback:150] Out: -  Intake/Output this shift: No intake/output data recorded.  PE: Gen:  Alert, NAD, pleasant Card:  Regular rate and rhythm, pedal pulses 2+ BL Pulm:  Normal effort, clear to auscultation bilaterally Abd: Soft, non-tender, non-distended, bowel sounds present in all 4 quadrants Skin: warm and dry, no rashes  Psych: A&Ox3   Lab Results:   Recent Labs  11/28/16 0802 11/29/16 0635  WBC 10.1 9.0  HGB 12.8* 11.8*  HCT 39.0 35.2*  PLT 275 255   BMET  Recent Labs  11/27/16 0520 11/28/16 0802  NA 139 140  K 3.1* 3.7  CL 100* 104  CO2 25 24  GLUCOSE 96 98  BUN 8 6  CREATININE 0.78 0.82  CALCIUM 8.8* 8.9   PT/INR No results for input(s): LABPROT, INR in the last 72 hours. CMP     Component Value Date/Time   NA 140 11/28/2016 0802   K 3.7 11/28/2016 0802   CL 104 11/28/2016 0802   CO2 24 11/28/2016 0802   GLUCOSE 98 11/28/2016 0802   BUN 6 11/28/2016 0802   CREATININE 0.82 11/28/2016 0802   CREATININE 0.88 09/15/2015 0959   CALCIUM 8.9 11/28/2016 0802   PROT 6.7 11/25/2016 0522   ALBUMIN 2.7 (L) 11/25/2016 0522   AST 15 11/25/2016 0522   ALT 12 (L) 11/25/2016 0522   ALKPHOS 53 11/25/2016 0522   BILITOT 0.8 11/25/2016 0522   GFRNONAA >60 11/28/2016 0802   GFRAA >60 11/28/2016 0802   Lipase     Component Value Date/Time   LIPASE 26 11/24/2016 1038        Studies/Results: No results found.  Anti-infectives: Anti-infectives    Start     Dose/Rate Route Frequency Ordered Stop   11/25/16 1400  piperacillin-tazobactam (ZOSYN) IVPB 3.375 g     3.375 g 12.5 mL/hr over 240 Minutes Intravenous Every 8 hours 11/25/16 1045     11/24/16 2330  piperacillin-tazobactam (ZOSYN) IVPB 3.375 g  Status:  Discontinued     3.375 g 12.5 mL/hr over 240 Minutes Intravenous Every 8 hours 11/24/16 1511 11/24/16 1634   11/24/16 1700  metroNIDAZOLE (FLAGYL) IVPB 500 mg  Status:  Discontinued     500 mg 100 mL/hr over 60 Minutes Intravenous Every 8 hours 11/24/16 1631 11/25/16 0847   11/24/16 1700  ciprofloxacin (CIPRO) IVPB 400 mg  Status:  Discontinued     400 mg 200 mL/hr over 60 Minutes Intravenous Every 12 hours 11/24/16 1635 11/25/16 0847   11/24/16 1530  piperacillin-tazobactam (ZOSYN) IVPB 3.375 g     3.375 g 100 mL/hr over 30 Minutes Intravenous  Once 11/24/16 1511 11/24/16 1612       Assessment/Plan CHF - echo 03/2015 LVEF 30-35% A.fib - xarelto PMH AAA HTN HLD Tobacco abuse  Sigmoid diverticulitis with microperforation and phlegmon              -  admitted 10/12-10/15 for management of acute diverticulitis with IV abx. D/C-ed home on PO abx             - readmitted 10/21 with diarrhea and worsening LLQ pain and CT revealed microperf/worsening phlegmon              - Clinically improving non-operatively with IV abx and WBC normal             - having bowel function and tolerating full liquids without exacerbation/recurrence of pain              Plan: tolerating diet. Transition to PO meds per primary team, I see they plan to consult ID for recommendations given the patients recent course of PO Augmentin.  Patient is stable for discharge from a surgical perspective.   No role for outpatient surgical follow up at this point in time. Should the patient have recurrent episode(s) of diverticulitis surgical consultation for sigmoid colectomy  may be warranted, however given patient age and comorbidities would need cardiac risk assessment prior to surgical planning.    LOS: 4 days    Jill Alexanders , [facility] 11/29/2016, 8:21 AM Pager: [phone] Consults: [phone] Mon-Fri 7:00 am-4:30 pm Sat-Sun 7:00 am-11:30 am

## 2016-11-30 LAB — CBC
HEMATOCRIT: 38.1 % — AB (ref 39.0–52.0)
HEMOGLOBIN: 13.2 g/dL (ref 13.0–17.0)
MCH: 30.3 pg (ref 26.0–34.0)
MCHC: 34.6 g/dL (ref 30.0–36.0)
MCV: 87.4 fL (ref 78.0–100.0)
Platelets: 270 10*3/uL (ref 150–400)
RBC: 4.36 MIL/uL (ref 4.22–5.81)
RDW: 14.9 % (ref 11.5–15.5)
WBC: 11.2 10*3/uL — AB (ref 4.0–10.5)

## 2016-11-30 LAB — BASIC METABOLIC PANEL
Anion gap: 8 (ref 5–15)
CALCIUM: 9 mg/dL (ref 8.9–10.3)
CHLORIDE: 109 mmol/L (ref 101–111)
CO2: 23 mmol/L (ref 22–32)
CREATININE: 0.81 mg/dL (ref 0.61–1.24)
GFR calc Af Amer: >60 mL/min (ref >60)
GFR calc non Af Amer: >60 mL/min (ref >60)
Glucose, Bld: 91 mg/dL (ref 65–99)
Potassium: 3.8 mmol/L (ref 3.5–5.1)
Sodium: 140 mmol/L (ref 135–145)

## 2016-11-30 MED ORDER — METRONIDAZOLE 500 MG PO TABS: 500.0000 mg | ORAL_TABLET | Freq: Three times a day (TID) | ORAL | 0 refills | Status: AC

## 2016-11-30 MED ORDER — PANTOPRAZOLE SODIUM 40 MG PO TBEC: 40.0000 mg | DELAYED_RELEASE_TABLET | Freq: Every day | ORAL | 0 refills | Status: DC

## 2016-11-30 MED ORDER — CIPROFLOXACIN HCL 500 MG PO TABS: 500.0000 mg | ORAL_TABLET | Freq: Two times a day (BID) | ORAL | 0 refills | Status: AC

## 2016-11-30 MED ORDER — BACID PO TABS
2.0000 | ORAL_TABLET | Freq: Three times a day (TID) | ORAL | 0 refills | Status: AC
Start: 2016-11-30 — End: 2016-12-11

## 2016-11-30 MED ORDER — SACCHAROMYCES BOULARDII 250 MG PO CAPS: 250.0000 mg | ORAL_CAPSULE | Freq: Two times a day (BID) | ORAL | 0 refills | Status: DC

## 2016-11-30 MED ORDER — LISINOPRIL 40 MG PO TABS: 40.0000 mg | ORAL_TABLET | Freq: Every day | ORAL | 0 refills | Status: DC

## 2016-11-30 NOTE — Progress Notes (Signed)
Nsg Discharge Note  Admit Date:  11/24/2016 Discharge date: 11/30/2016   Benjamin Cook to be D/C'd Home per MD order.  AVS completed.  Copy for chart, and copy for patient signed, and dated. Patient/caregiver able to verbalize understanding.  Discharge Medication: Allergies as of 11/30/2016   No Known Allergies     Medication List    STOP taking these medications   ADVIL PM PO     TAKE these medications   atorvastatin 80 MG tablet Commonly known as:  LIPITOR TAKE 1 TABLET BY MOUTH  DAILY AT 6 PM. What changed:  See the new instructions.   carvedilol 25 MG tablet Commonly known as:  COREG TAKE 1 TABLET BY MOUTH TWO  TIMES DAILY WITH A MEAL What changed:  See the new instructions.   ciprofloxacin 500 MG tablet Commonly known as:  CIPRO Take 1 tablet (500 mg total) by mouth 2 (two) times daily.   Fluticasone-Salmeterol 100-50 MCG/DOSE Aepb Commonly known as:  ADVAIR Inhale 1 puff into the lungs 2 (two) times daily.   furosemide 40 MG tablet Commonly known as:  LASIX Take 0.5 tablets (20 mg total) by mouth daily.   lactobacillus acidophilus Tabs tablet Take 2 tablets by mouth 3 (three) times daily.   lisinopril 40 MG tablet Commonly known as:  PRINIVIL,ZESTRIL Take 1 tablet (40 mg total) by mouth daily. What changed:  medication strength  See the new instructions.   liver oil-zinc oxide 40 % ointment Commonly known as:  DESITIN Apply 1 application topically as needed for irritation.   metroNIDAZOLE 500 MG tablet Commonly known as:  FLAGYL Take 1 tablet (500 mg total) by mouth 3 (three) times daily.   multivitamin capsule Take 1 capsule by mouth daily.   pantoprazole 40 MG tablet Commonly known as:  PROTONIX Take 1 tablet (40 mg total) by mouth at bedtime.   potassium chloride SA 20 MEQ tablet Commonly known as:  K-DUR,KLOR-CON Take 2 tablets (40 mEq total) by mouth daily.   rivaroxaban 20 MG Tabs tablet Commonly known as:  XARELTO Take 1  tablet (20 mg total) by mouth daily with supper.   saccharomyces boulardii 250 MG capsule Commonly known as:  FLORASTOR Take 1 capsule (250 mg total) by mouth 2 (two) times daily.   witch hazel-glycerin pad Commonly known as:  TUCKS Apply 1 application topically as needed for itching or irritation.       Discharge Assessment: Vitals:   11/30/16 0825 11/30/16 0903  BP:  (!) 164/90  Pulse:    Resp:    Temp:    SpO2: 96%    Skin clean, dry and intact without evidence of skin break down, no evidence of skin tears noted. IV catheter discontinued intact. Site without signs and symptoms of complications - no redness or edema noted at insertion site, patient denies c/o pain - only slight tenderness at site.  Dressing with slight pressure applied.  D/c Instructions-Education: Discharge instructions given to patient/family with verbalized understanding. D/c education completed with patient/family including follow up instructions, medication list, d/c activities limitations if indicated, with other d/c instructions as indicated by MD - patient able to verbalize understanding, all questions fully answered. Patient instructed to return to ED, call 911, or call MD for any changes in condition.  Patient escorted via [place], and D/C home via private auto.  [name] Slaughter, RN 11/30/2016 1:21 PM

## 2016-12-02 NOTE — Discharge Summary (Signed)
[facility] Discharge Summary   Patient: Benjamin Cook INO:[id number]   PCP: Renato Shin, MD DOB: Sep 07, 1945   Date of admission: 11/24/2016   Date of discharge: 11/30/2016   Discharge Diagnoses:  Active Problems:   HLD (hyperlipidemia)   Anxiety state   TOBACCO ABUSE   Cardiomyopathy, ischemic   Aneurysm of abdominal vessel (5.6 CM)   COPD, severe ([name])   HYPERGLYCEMIA   PSA, INCREASED   Chronic systolic heart failure ([name])   ICD (implantable cardioverter-defibrillator) in place   Essential hypertension   Acute Sigmoid diverticulitis   Diverticulitis   Adrenal mass ([place])   Admitted From: home Disposition:  home  Recommendations for Outpatient Follow-up:  1. Please follow-up with PCP in 1 week, vascular surgery is recommended.  Follow-up Information    Renato Shin, MD. Schedule an appointment as soon as possible for a visit in 1 week(s).   Specialty:  Endocrinology Contact information: [address] [place] [address] [phone]          Diet recommendation: Soft diet  Activity: The patient is advised to gradually reintroduce usual activities.  Discharge Condition: good  Code Status: Full code  History of present illness: As per the H and P dictated on admission, "[name] is a 71 y.o. male with medical history significant for ischemic cardiomyopathy, last known EF 35%, history ofabdominal aortic aneurysm, tobacco abuse, COPD, and recent admission for diverticulitis from 1013 through 11/18/2016 treated with IV fluids, IV Zosyn, discharged on Augmentin, which he never filled out. He states since discharge, the patient did not feeling well. He presents today with generalized lower quadrant tenderness, especially on the left, nausea without vomiting. He denies any diarrhea. He denies any food poisoning. He also denies eating any spicy foods, or seedy foods such as tomatoes or nuts.he denies any fever, chills or night sweats. He  denies any chest pain or palpitations. He denies any shortness of breath or cough.he denies any lower extremity swelling. He continues to smoke, he denies any recreational drug use.  ED Course:  BP (!) 158/61 (BP Location: Right Arm)   Pulse 89   Temp 97.8 F (36.6 C)   Resp 18   SpO2 97%    CT of the abdomen and pelvis. Increasing inflammatory changes from recent acute sigmoid diverticulitis, now with new 5.5 x 7.6 x 5 cm ill-defined phlegmon with foci of free air adjacent to the sigmoid colon. No discrete abscess identified. No evidence of bowel obstruction . 2. 5.5 cm infrarenal suprailiac abdominal aortic aneurysm, unchanged from 11/15/2016, but significantly increased from 2005. As noted previously, vascular surgery consultation recommended if not performed.  3. Common iliac artery aneurysms as described  4. Marked prostate enlargement and small bilateral inguinal hernias containing fat 5. Benign right adrenal mass/adenoma. White count is 17.1. CMP is overall unremarkable. Lactic acid is normal. Received IV Zosyn, 500 cc bolus IV NS and pain meds "  Hospital Course:  Summary of his active problems in the hospital is as following. Sigmoid diverticulitis, non resolved from prior admission, worsening symptoms.  CT abdomen and pelvis shows increasing inflammatory changes from recent acute sigmoid diverticulitis, now with new 5.5 x 7.6 x 5 cm ill-defined phlegmon with foci of free air adjacent to the sigmoid colon.  No discrete abscess identified.  No evidence of bowel obstruction.(seen by IR and no window opportunity for drainage) -advance diet per surgery -Initially treated with IV Zosyn- failed PO Antibiotics. Appreciate general surgery input.  They Would like to switch the patient to p.o.   Discussed with ID, recommend to switch to Cipro Flagyl, no acute issues following switching to p.o. antibiotics.  Will discharge home on a total of 10-day course.  Chronic systolic SHF0/2637 EF  85-88 percent, mod to severely reduced systolic, Gr 1 DD. Weight 150. No acute decompensation Switching from heparin to Xarelto again.  Tobacco abusewithout nicotine withdrawal/ COPD  - Nicotine patch if needed, he declined at this time  - Counseled cessation Continue inhalers prn   History of AAA, now enlarging per last CT abdomen. 5.5 cm infrarenal suprailiac abdominal aortic aneurysm, unchanged from 11/15/2016, but significantly increased from 2005Vascular surgery to operate once diverticulitis is resolved  Discussed with Dr. Bridgett Larsson, recommend to maintain current follow-up date.  Hypertension BP 132/72  Pulse 80  Continue home anti-hypertensive medications   Hyperlipidemia Continue home statins  Hypokalemia/hupomagnesemia -replete  All other chronic medical condition were stable during the hospitalization.  Patient was ambulatory without any assistance. On the day of the discharge the patient's vitals were stable, and no other acute medical condition were reported by patient. the patient was felt safe to be discharge at home with family.  Procedures and Results:  none   Consultations:  General surgery  Cardiology  DISCHARGE MEDICATION: Discharge Medication List as of 11/30/2016  1:19 PM    START taking these medications   Details  ciprofloxacin (CIPRO) 500 MG tablet Take 1 tablet (500 mg total) by mouth 2 (two) times daily., Starting Sat 11/30/2016, Until Wed 12/04/2016, Normal    lactobacillus acidophilus (BACID) TABS tablet Take 2 tablets by mouth 3 (three) times daily., Starting Sat 11/30/2016, Until Wed 12/11/2016, Normal    metroNIDAZOLE (FLAGYL) 500 MG tablet Take 1 tablet (500 mg total) by mouth 3 (three) times daily., Starting Sat 11/30/2016, Until Wed 12/04/2016, Normal    pantoprazole (PROTONIX) 40 MG tablet Take 1 tablet (40 mg total) by mouth at bedtime., Starting Sat 11/30/2016, Normal    saccharomyces boulardii (FLORASTOR) 250 MG capsule Take  1 capsule (250 mg total) by mouth 2 (two) times daily., Starting Sat 11/30/2016, Normal      CONTINUE these medications which have CHANGED   Details  lisinopril (PRINIVIL,ZESTRIL) 40 MG tablet Take 1 tablet (40 mg total) by mouth daily., Starting Sun 12/01/2016, Normal      CONTINUE these medications which have NOT CHANGED   Details  atorvastatin (LIPITOR) 80 MG tablet TAKE 1 TABLET BY MOUTH  DAILY AT 6 PM., Normal    carvedilol (COREG) 25 MG tablet TAKE 1 TABLET BY MOUTH TWO  TIMES DAILY WITH A MEAL, Normal    Fluticasone-Salmeterol (ADVAIR) 100-50 MCG/DOSE AEPB Inhale 1 puff into the lungs 2 (two) times daily., Starting Mon 04/10/2015, Normal    furosemide (LASIX) 40 MG tablet Take 0.5 tablets (20 mg total) by mouth daily., Starting Mon 11/18/2016, Normal    liver oil-zinc oxide (DESITIN) 40 % ointment Apply 1 application topically as needed for irritation., Historical Med    Multiple Vitamin (MULTIVITAMIN) capsule Take 1 capsule by mouth daily.  , Until Discontinued, Historical Med    potassium chloride SA (K-DUR,KLOR-CON) 20 MEQ tablet Take 2 tablets (40 mEq total) by mouth daily., Starting Mon 11/18/2016, Normal    rivaroxaban (XARELTO) 20 MG TABS tablet Take 1 tablet (20 mg total) by mouth daily with supper., Starting Wed 10/09/2016, Normal    witch hazel-glycerin (TUCKS) pad Apply 1 application topically as needed for itching or irritation., Historical Med  STOP taking these medications     Ibuprofen-Diphenhydramine Cit (ADVIL PM PO)        No Known Allergies Discharge Instructions    Diet - low sodium heart healthy    Complete by:  As directed    Discharge instructions    Complete by:  As directed    It is important that you read following instructions as well as go over your medication list with RN to help you understand your care after this hospitalization.  Discharge Instructions: Please follow-up with PCP in one week  Please request your primary care  physician to go over all Hospital Tests and Procedure/Radiological results at the follow up,  Please get all Hospital records sent to your PCP by signing hospital release before you go home.   Do not take more than prescribed Pain, Sleep and Anxiety Medications. You were cared for by a hospitalist during your hospital stay. If you have any questions about your discharge medications or the care you received while you were in the hospital after you are discharged, you can call the unit and ask to speak with the hospitalist on call if the hospitalist that took care of you is not available.  Once you are discharged, your primary care physician will handle any further medical issues. Please note that NO REFILLS for any discharge medications will be authorized once you are discharged, as it is imperative that you return to your primary care physician (or establish a relationship with a primary care physician if you do not have one) for your aftercare needs so that they can reassess your need for medications and monitor your lab values. You Must read complete instructions/literature along with all the possible adverse reactions/side effects for all the Medicines you take and that have been prescribed to you. Take any new Medicines after you have completely understood and accept all the possible adverse reactions/side effects. Wear Seat belts while driving. If you have smoked or chewed Tobacco in the last 2 yrs please stop smoking and/or stop any Recreational drug use.   Increase activity slowly    Complete by:  As directed      Discharge Exam: Filed Weights   11/26/16 0530 11/28/16 0500 11/29/16 0603  Weight: 63.5 kg (140 lb 1.6 oz) 63.8 kg (140 lb 9.6 oz) 62.8 kg (138 lb 6.4 oz)   Vitals:   11/30/16 0825 11/30/16 0903  BP:  (!) 164/90  Pulse:    Resp:    Temp:    SpO2: 96%    General: Appear in no distress, no Rash; Oral Mucosa moist. Cardiovascular: S1 and S2 Present, no Murmur, no  JVD Respiratory: Bilateral Air entry present and Clear to Auscultation, no Crackles, no wheezes Abdomen: Bowel Sound present, Soft and no tenderness Extremities: no Pedal edema, no calf tenderness Neurology: Grossly no focal neuro deficit.  The results of significant diagnostics from this hospitalization (including imaging, microbiology, ancillary and laboratory) are listed below for reference.    Significant Diagnostic Studies: Ct Abdomen Pelvis W Contrast  Result Date: 11/24/2016 CLINICAL DATA:  71 year old male with abdominal and pelvic pain for 1 week. EXAM: CT ABDOMEN AND PELVIS WITH CONTRAST TECHNIQUE: Multidetector CT imaging of the abdomen and pelvis was performed using the standard protocol following bolus administration of intravenous contrast. CONTRAST:  170mL ISOVUE-300 IOPAMIDOL (ISOVUE-300) INJECTION 61% COMPARISON:  11/15/2016 and prior CTs FINDINGS: Lower chest: No acute abnormality. Hepatobiliary: The liver and gallbladder are unremarkable. No biliary dilatation. Pancreas: Unremarkable Spleen: Unremarkable Adrenals/Urinary Tract:  A 3.1 x 4 cm low-density right adrenal mass is unchanged from 2005-benign. Small bilateral renal cysts are present. There is no evidence of hydronephrosis. The visualized portions of the bladder are unremarkable. Stomach/Bowel: New 5.5 x 7.6 x 5 cm ill-defined phlegmonous change with foci of free air noted adjacent to the sigmoid colon at this site of recent acute diverticulitis. No discrete abscess is identified. There is no evidence of bowel obstruction. The appendix is normal. Vascular/Lymphatic: A 5.5 cm infrarenal suprailiac abdominal aortic aneurysm is again noted with unchanged 2.2 cm right common iliac and 2 cm left common iliac artery aneurysms. No enlarged lymph nodes. Reproductive: Marked prostate enlargement again noted. Other: No ascites. Small bilateral inguinal hernias containing fat again identified. Musculoskeletal: No acute or does suspicious  focal bony abnormality. Moderate to severe degenerative disc disease and spondylosis in the lower lumbar spine again noted. IMPRESSION: 1. Increasing inflammatory changes from recent acute sigmoid diverticulitis, now with new 5.5 x 7.6 x 5 cm ill-defined phlegmon with foci of free air adjacent to the sigmoid colon. No discrete abscess identified. No evidence of bowel obstruction. 2. 5.5 cm infrarenal suprailiac abdominal aortic aneurysm, unchanged from 11/15/2016, but significantly increased from 2005. As noted previously, vascular surgery consultation recommended if not performed. 3. Common iliac artery aneurysms as described 4. Marked prostate enlargement and small bilateral inguinal hernias containing fat 5. Benign right adrenal mass/adenoma. Electronically Signed   By: Margarette Canada M.D.   On: 11/24/2016 15:03   Ct Abdomen Pelvis W Contrast  Result Date: 11/15/2016 CLINICAL DATA:  Suprapubic pain and diarrhea. EXAM: CT ABDOMEN AND PELVIS WITH CONTRAST TECHNIQUE: Multidetector CT imaging of the abdomen and pelvis was performed using the standard protocol following bolus administration of intravenous contrast. CONTRAST:  11mL ISOVUE-300 IOPAMIDOL (ISOVUE-300) INJECTION 61% COMPARISON:  CT abdomen pelvis dated April 13, 2003. FINDINGS: Lower chest: No acute abnormality.  Cardiomegaly. Hepatobiliary: No focal liver abnormality is seen. No gallstones, gallbladder wall thickening, or biliary dilatation. Pancreas: Unremarkable. No pancreatic ductal dilatation or surrounding inflammatory changes. Spleen: Normal in size without focal abnormality. Adrenals/Urinary Tract: Unchanged 4.1 cm right adrenal mass, likely an adenoma. The left adrenal gland is unremarkable. Subcentimeter low-density lesions in both kidneys are too small to characterize. No hydronephrosis. No ureteral calculi. The bladder is partially decompressed. Stomach/Bowel: Sigmoid colonic wall thickening with inflammatory changes surrounding an inflamed  diverticulum. Small hiatal hernia. The stomach is otherwise within normal limits. No bowel obstruction. Normal appendix. Vascular/Lymphatic: Severe aortic atherosclerosis with interval increase in size of fusiform infrarenal abdominal aortic aneurysm, now measuring up to 5.6 cm in maximum dimension, previously 3.1 cm. Interval increase in size of ectatic bilateral common iliac arteries. The right common iliac artery measures up to 2.2 cm. Mild aneurysmal dilatation of the proximal right internal iliac artery. Severe atherosclerosis of the mid to distal SMA. No lymphadenopathy. Reproductive: Markedly prostate enlargement with prominent central gland hypertrophy. Other: Trace free fluid in the pelvis. No pneumoperitoneum. Small fat containing bilateral inguinal hernias. Musculoskeletal: No acute or significant osseous findings. Severe degenerative changes of the lower lumbar spine. IMPRESSION: 1. Acute, uncomplicated sigmoid diverticulitis. 2. Interval increase in size of fusiform, infrarenal abdominal aortic aneurysm, measuring up to 5.6 cm. Vascular surgery consultation recommended due to increased risk of rupture for AAA >5.5 cm. This recommendation follows ACR consensus guidelines: White Paper of the ACR Incidental Findings Committee II on Vascular Findings. J Am Coll Radiol 2013; 10:789-794. 3. Ectasia of the bilateral common iliac arteries, increased when compared  to prior study. Mild aneurysmal dilatation of the proximal right internal iliac artery. 4.  Aortic atherosclerosis (ICD10-I70.0). 5. Marked prostatomegaly with prominent central gland hypertrophy. 6. Unchanged 4.1 cm right adrenal mass, likely a benign adenoma. Electronically Signed   By: Titus Dubin M.D.   On: 11/15/2016 10:40    Microbiology: No results found for this or any previous visit (from the past 240 hour(s)).   Labs: CBC:  Recent Labs Lab 11/26/16 0515 11/27/16 0520 11/28/16 0802 11/29/16 0635 11/30/16 0655  WBC 20.0*  11.9* 10.1 9.0 11.2*  HGB 12.6* 12.3* 12.8* 11.8* 13.2  HCT 38.7* 36.8* 39.0 35.2* 38.1*  MCV 89.4 87.8 87.4 88.2 87.4  PLT 310 279 275 255 427   Basic Metabolic Panel:  Recent Labs Lab 11/26/16 0515 11/27/16 0520 11/28/16 0802 11/29/16 0635 11/30/16 0655  NA 136 139 140 140 140  K 3.7 3.1* 3.7 3.6 3.8  CL 100* 100* 104 107 109  CO2 24 25 24 23 23   GLUCOSE 81 96 98 98 91  BUN 6 8 6  5* <5*  CREATININE 0.90 0.78 0.82 0.83 0.81  CALCIUM 9.1 8.8* 8.9 8.9 9.0  MG  --  1.8  --  1.9  --    CBG:  Recent Labs Lab 11/26/16 0747 11/26/16 1207 11/26/16 1650  GLUCAP 86 99 117*   Time spent: 35 minutes  Signed:  Demontrae Gilbert  [facility] 11/30/2016 , 2:24 PM

## 2016-12-06 ENCOUNTER — Ambulatory Visit: Payer: Medicare Other | Admitting: Vascular Surgery

## 2016-12-09 ENCOUNTER — Ambulatory Visit (INDEPENDENT_AMBULATORY_CARE_PROVIDER_SITE_OTHER): Payer: Medicare Other | Admitting: Endocrinology

## 2016-12-09 VITALS — BP 142/84 | HR 77 | Wt 144.0 lb

## 2016-12-09 DIAGNOSIS — E279 Disorder of adrenal gland, unspecified: Secondary | ICD-10-CM

## 2016-12-09 DIAGNOSIS — I714 Abdominal aortic aneurysm, without rupture, unspecified: Secondary | ICD-10-CM

## 2016-12-09 DIAGNOSIS — R7309 Other abnormal glucose: Secondary | ICD-10-CM

## 2016-12-09 DIAGNOSIS — E278 Other specified disorders of adrenal gland: Secondary | ICD-10-CM

## 2016-12-09 DIAGNOSIS — R972 Elevated prostate specific antigen [PSA]: Secondary | ICD-10-CM

## 2016-12-09 LAB — URINALYSIS, ROUTINE W REFLEX MICROSCOPIC
Bilirubin Urine: NEGATIVE
Ketones, ur: NEGATIVE
Leukocytes, UA: NEGATIVE
NITRITE: NEGATIVE
Specific Gravity, Urine: 1.02 (ref 1.000–1.030)
TOTAL PROTEIN, URINE-UPE24: NEGATIVE
Urine Glucose: NEGATIVE
Urobilinogen, UA: 0.2 (ref 0.0–1.0)
pH: 5 (ref 5.0–8.0)

## 2016-12-09 LAB — LIPID PANEL
CHOL/HDL RATIO: 4
Cholesterol: 114 mg/dL (ref 0–200)
HDL: 32.1 mg/dL — ABNORMAL LOW (ref >39.00)
LDL Cholesterol: 61 mg/dL (ref 0–99)
NONHDL: 81.43
TRIGLYCERIDES: 104 mg/dL (ref 0.0–149.0)
VLDL: 20.8 mg/dL (ref 0.0–40.0)

## 2016-12-09 LAB — TSH: TSH: 0.64 u[IU]/mL (ref 0.35–4.50)

## 2016-12-09 LAB — PSA: PSA: 8.54 ng/mL — AB (ref 0.10–4.00)

## 2016-12-09 LAB — HEMOGLOBIN A1C: HEMOGLOBIN A1C: 6.4 % (ref 4.6–6.5)

## 2016-12-09 LAB — CORTISOL: Cortisol, Plasma: 9.4 ug/dL

## 2016-12-09 NOTE — Patient Instructions (Addendum)
Please see the blood vessel specialist on 12/20/16.   blood tests are requested for you today.  We'll let you know about the results.   Also, please do a 24-HR urine test.   Please consider these measures for your health:  minimize alcohol.  Do not use tobacco products.  Have a colonoscopy at least every 10 years from age 71.  Keep firearms safely stored.  Always use seat belts.  have working smoke alarms in your home.  See an eye doctor and dentist regularly.  Never drive under the influence of alcohol or drugs (including prescription drugs).   It is critically important to prevent falling down (keep floor areas well-lit, dry, and free of loose objects.  If you have a cane, walker, or wheelchair, you should use it, even for short trips around the house.  Wear flat-soled shoes.  Also, try not to rush) good diet and exercise significantly improve your health.  please let me know if you wish to be referred to a dietician.  high blood sugar is very risky to your health.  you should see an eye doctor and dentist every year.  It is very important to get all recommended vaccinations.  Please come back for a follow-up appointment in 2 weeks.

## 2016-12-09 NOTE — Progress Notes (Signed)
Subjective:    Patient ID: Benjamin Cook, male    DOB: [birth year], 71 y.o.   MRN: [id number]  HPI The state of at least three ongoing medical problems is addressed today, with interval history of each noted here: AAA was noted.  abd pain is resolved.   He was recently rx'ed for diverticulitis in the hospital.  Denies fever now.  pt states he feels better in general.  Adrenal nodule was noted.  Denies headache Past Medical History:  Diagnosis Date  . Adrenal mass ([place])    per pt this is remote (10 years) and benign by biopsy  . AICD (automatic cardioverter/defibrillator) present   . Anxiety   . CAD (coronary artery disease)    a. s/p PCI to LAD 2001 b. myoview 2014 high risk with scar LAD/RCA territory but no ischemia  . Cardiomyopathy, ischemic   . CHF (congestive heart failure) ([name])    class II/III  . COPD (chronic obstructive pulmonary disease) ([name])    smokes cigars but has quit cigarettes  . Flu 03/2015  . HTN (hypertension)   . Hyperlipidemia   . Paroxysmal atrial fibrillation ([name]) 03/2015   chads2vasc score is 4  . PSA (psoriatic arthritis) ([place])    increased  . PVD (peripheral vascular disease) ([place])     Past Surgical History:  Procedure Laterality Date  . cataract surgery    . CORONARY ANGIOPLASTY WITH STENT PLACEMENT  2001   a. PCI to LAD    Social History   Socioeconomic History  . Marital status: Married    Spouse name: Not on file  . Number of children: Not on file  . Years of education: Not on file  . Highest education level: Not on file  Social Needs  . Financial resource strain: Not on file  . Food insecurity - worry: Not on file  . Food insecurity - inability: Not on file  . Transportation needs - medical: Not on file  . Transportation needs - non-medical: Not on file  Occupational History  . Occupation: Lawn care  Tobacco Use  . Smoking status: Current Every Day Smoker    Types: Cigars  . Smokeless tobacco: Never Used  . Tobacco  comment: smokes cigars now, no longer smokes cigarettes but previously smoked 2ppd  Substance and Sexual Activity  . Alcohol use: No  . Drug use: No  . Sexual activity: Not Currently    Partners: Female    Birth control/protection: None  Other Topics Concern  . Not on file  Social History Narrative  . Not on file    Current Outpatient Medications on File Prior to Visit  Medication Sig Dispense Refill  . atorvastatin (LIPITOR) 80 MG tablet TAKE 1 TABLET BY MOUTH  DAILY AT 6 PM. (Patient taking differently: TAKE 80mg  BY MOUTH  DAILY AT 6 PM.) 90 tablet 0  . carvedilol (COREG) 25 MG tablet TAKE 1 TABLET BY MOUTH TWO  TIMES DAILY WITH A MEAL (Patient taking differently: TAKE 25mg  BY MOUTH TWO  TIMES DAILY WITH A MEAL) 180 tablet 0  . Fluticasone-Salmeterol (ADVAIR) 100-50 MCG/DOSE AEPB Inhale 1 puff into the lungs 2 (two) times daily. 1 each 3  . furosemide (LASIX) 40 MG tablet Take 0.5 tablets (20 mg total) by mouth daily. 90 tablet 0  . lactobacillus acidophilus (BACID) TABS tablet Take 2 tablets by mouth 3 (three) times daily. 66 tablet 0  . lisinopril (PRINIVIL,ZESTRIL) 40 MG tablet Take 1 tablet (40 mg total) by mouth  daily. 30 tablet 0  . liver oil-zinc oxide (DESITIN) 40 % ointment Apply 1 application topically as needed for irritation.    . Multiple Vitamin (MULTIVITAMIN) capsule Take 1 capsule by mouth daily.      . pantoprazole (PROTONIX) 40 MG tablet Take 1 tablet (40 mg total) by mouth at bedtime. 30 tablet 0  . potassium chloride SA (K-DUR,KLOR-CON) 20 MEQ tablet Take 2 tablets (40 mEq total) by mouth daily. 10 tablet 0  . rivaroxaban (XARELTO) 20 MG TABS tablet Take 1 tablet (20 mg total) by mouth daily with supper. 30 tablet 5  . saccharomyces boulardii (FLORASTOR) 250 MG capsule Take 1 capsule (250 mg total) by mouth 2 (two) times daily. 30 capsule 0  . witch hazel-glycerin (TUCKS) pad Apply 1 application topically as needed for itching or irritation.     No current  facility-administered medications on file prior to visit.     No Known Allergies  Family History  Problem Relation Age of Onset  . Cirrhosis Mother        due to ETOH  . Cancer Neg Hx     BP (!) 142/84 (BP Location: Right Arm, Patient Position: Sitting)   Pulse 77   Wt 144 lb (65.3 kg)   SpO2 98%   BMI 20.08 kg/m    Review of Systems Denies palpitations and excessive diaphoresis.     Objective:   Physical Exam VITAL SIGNS:  See vs page GENERAL: no distress.  LUNGS:  Clear to auscultation HEART:  Regular rate and rhythm without murmurs noted. Normal S1,S2.   ABDOMEN: abdomen is soft, nontender.  no hepatosplenomegaly.  not distended.  no hernia      Assessment & Plan:  AAA: clinically stable. diverticulitis: clinically better Adrenal nodule: w/u needed. HTN: with probable situational component.  Please continue the same medication for now.    Please see the blood vessel specialist on 12/20/16.   blood tests are requested for you today.  We'll let you know about the results.   Also, please do a 24-HR urine test.     Subjective:   Patient here for Medicare annual wellness visit and management of other chronic and acute problems.     Risk factors: advanced age    53 of Physicians Providing Medical Care to Patient:  See "snapshot"   Activities of Daily Living: In your present state of health, do you have any difficulty performing the following activities (lives with wife)?:  Preparing food and eating?: No  Bathing yourself: No  Getting dressed: No  Using the toilet:No  Moving around from place to place: No  In the past year have you fallen or had a near fall?: No    Home Safety: Has smoke detector and wears seat belts. Firearms are safely stored.    Opioid Use: none   Diet and Exercise  Current exercise habits: pt says good Dietary issues discussed: pt reports a healthy diet   Depression Screen  Q1: Over the past two weeks, have you felt down,  depressed or hopeless? no  Q2: Over the past two weeks, have you felt little interest or pleasure in doing things? no   The following portions of the patient's history were reviewed and updated as appropriate: allergies, current medications, past family history, past medical history, past social history, past surgical history and problem list.   Review of Systems  Denies hearing loss, and visual loss Objective:   Vision:  Advertising account executive, so he declines VA today.  Hearing: grossly normal Body mass index:  See vs page Msk: pt easily and quickly performs "get-up-and-go" from a sitting position.  Cognitive Impairment Assessment: cognition, memory and judgment appear normal.  remembers 1/3 at 5 minutes (? effort).  excellent recall.  can easily read and write a sentence.  alert and oriented x 3.     Assessment:   Medicare wellness utd on preventive parameters    Plan:   During the course of the visit the patient was educated and counseled about appropriate screening and preventive services including:        Fall prevention is advised today  Diabetes screening is done today Nutrition counseling is offered   Vaccines are updated as needed  Patient Instructions (the written plan) was given to the patient.

## 2016-12-09 NOTE — Progress Notes (Signed)
we discussed code status.  pt requests full code, but would not want to be started or maintained on artificial life-support measures if there was not a reasonable chance of recovery 

## 2016-12-10 ENCOUNTER — Ambulatory Visit (INDEPENDENT_AMBULATORY_CARE_PROVIDER_SITE_OTHER): Payer: Medicare Other

## 2016-12-10 ENCOUNTER — Telehealth: Payer: Self-pay | Admitting: Cardiology

## 2016-12-10 DIAGNOSIS — Z95810 Presence of automatic (implantable) cardiac defibrillator: Secondary | ICD-10-CM | POA: Diagnosis not present

## 2016-12-10 DIAGNOSIS — I5022 Chronic systolic (congestive) heart failure: Secondary | ICD-10-CM | POA: Diagnosis not present

## 2016-12-10 NOTE — Telephone Encounter (Signed)
Confirmed remote transmission w/ pt wife.   

## 2016-12-13 NOTE — Progress Notes (Signed)
EPIC Encounter for ICM Monitoring  Patient Name: Benjamin Cook is a 71 y.o. male Date: 12/13/2016 Primary Care Physican: Renato Shin, MD Primary Cardiologist:[place] Electrophysiologist: Allred Dry Weight: unknown           Attempted call to patient and unable to reach. Transmission reviewed.   Hospitalized for nausea/vomiting from 10/12 to 10/15 and readmitted 11/24/2016 - 11/30/2016   [place]: Thoracic impedance normal but was abnormal suggesting fluid accumulation 11/10/2016 to 11/21/2016 and 11/29/2016 to 12/01/2016.  Prescribed dosage: Furosemide 40 mg 0.5 tablet (20 mg total) daily (reduced from 40 mg to 20 mg by hospitalist at time of discharge 11/18/2016). Potassium 20 mEq 1 tablet daily.  Labs: 11/30/2016 Creatinine 0.81, BUN <5, Potassium 3.8, Sodium 140, EGFR >60 11/29/2016 Creatinine 0.83, BUN 5,   Potassium 3.6, Sodium 140, EGFR >60  11/28/2016 Creatinine 0.82, BUN 6,   Potassium 3.7, Sodium 140, EGFR >60  11/27/2016 Creatinine 0.78, BUN 8,   Potassium 3.1, Sodium 139, EGFR >60  11/26/2016 Creatinine 0.90, BUN 6,   Potassium 3.7, Sodium 136, EGFR >60  11/25/2016 Creatinine 0.79, BUN 7,   Potassium 3.8, Sodium 134, EGFR >60  11/24/2016 Creatinine 0.81, BUN 7,   Potassium 3.9, Sodium 135, EGFR >60  11/18/2016 Creatinine 0.70, BUN <5, Potassium 3.2, Sodium 138, EGFR >60 11/17/2016 Creatinine 0.68, BUN <5, Potassium 3.2, Sodium 136, EGFR >60  11/16/2016 Creatinine 0.83, BUN 7,   Potassium 3.4, Sodium 136, EGFR >60  11/15/2016 Creatinine 0.71, BUN 7,   Potassium 4.0, Sodium 136, EGFR >60  07/17/2016 Creatinine 0.86, BUN 10, Potassium 3.4, Sodium 39, EGFR >60 09/15/2015 Creatinine 0.88, BUN 10, Potassium 4.6, Sodium 142  Recommendations: NONE - Unable to reach.  Follow-up plan: ICM clinic phone appointment on 01/20/2017.    Copy of ICM check sent to Dr. Rayann Heman.   3 month ICM trend: 12/11/2016    1 Year ICM trend:       [name], RN 12/13/2016 10:51  AM

## 2016-12-17 LAB — ALDOSTERONE + RENIN ACTIVITY W/ RATIO
ALDO / PRA RATIO: 2.8 ratio (ref 0.9–28.9)
ALDOSTERONE, SERUM: 2 ng/dL
Renin Activity: 0.72 ng/mL/h (ref 0.25–5.82)

## 2016-12-20 ENCOUNTER — Encounter: Payer: Self-pay | Admitting: Vascular Surgery

## 2016-12-20 ENCOUNTER — Ambulatory Visit: Payer: Medicare Other | Admitting: Vascular Surgery

## 2016-12-20 VITALS — BP 150/86 | HR 66 | Temp 97.6°F | Resp 20 | Ht 71.0 in | Wt 146.0 lb

## 2016-12-20 DIAGNOSIS — I714 Abdominal aortic aneurysm, without rupture, unspecified: Secondary | ICD-10-CM

## 2016-12-20 NOTE — Progress Notes (Signed)
Patient ID: Benjamin Cook, male   DOB: 10-17-1945, 71 y.o.   MRN: [id number]  Reason for Consult: Follow-up (2 wk to plan EVAR)   Referred by Renato Shin, MD  Subjective:     HPI:  Benjamin Cook is a 71 y.o. male with history of abdominal aortic aneurysm that was noted to be greater than 5 cm at most recent hospital visit which was for diverticulitis.  He is now over this although he is having some abdominal pain radiating to his back since the diverticulitis episode.  He is no longer on antibiotics.  He is eating although his appetite has been somewhat decreased.  He does not take any blood thinners.  He does have a AICD in place with diagnosis of congestive heart failure.  Past Medical History:  Diagnosis Date  . Adrenal mass ([place])    per pt this is remote (10 years) and benign by biopsy  . AICD (automatic cardioverter/defibrillator) present   . Anxiety   . CAD (coronary artery disease)    a. s/p PCI to LAD 2001 b. myoview 2014 high risk with scar LAD/RCA territory but no ischemia  . Cardiomyopathy, ischemic   . CHF (congestive heart failure) ([name])    class II/III  . COPD (chronic obstructive pulmonary disease) ([name])    smokes cigars but has quit cigarettes  . Flu 03/2015  . HTN (hypertension)   . Hyperlipidemia   . Paroxysmal atrial fibrillation ([name]) 03/2015   chads2vasc score is 4  . PSA (psoriatic arthritis) ([place])    increased  . PVD (peripheral vascular disease) ([name])    Family History  Problem Relation Age of Onset  . Cirrhosis Mother        due to ETOH  . Cancer Neg Hx    Past Surgical History:  Procedure Laterality Date  . cataract surgery    . CORONARY ANGIOPLASTY WITH STENT PLACEMENT  2001   a. PCI to LAD  . ICD GENERATOR CHANGE N/A 03/18/2014   Performed by Thompson Grayer, MD at [facility] CATH LAB  . Lead Revision/Repair N/A 07/17/2016   Performed by Evans Lance, MD at [facility]  . LEFT HEART CATHETERIZATION WITH CORONARY ANGIOGRAM N/A  05/27/2014   Performed by Sherren Mocha, MD at [facility] CATH LAB    Short Social History:  Social History   Tobacco Use  . Smoking status: Current Every Day Smoker    Types: Cigars  . Smokeless tobacco: Never Used  . Tobacco comment: smokes cigars now, no longer smokes cigarettes but previously smoked 2ppd  Substance Use Topics  . Alcohol use: No    No Known Allergies  Current Outpatient Medications  Medication Sig Dispense Refill  . atorvastatin (LIPITOR) 80 MG tablet TAKE 1 TABLET BY MOUTH  DAILY AT 6 PM. (Patient taking differently: TAKE 80mg  BY MOUTH  DAILY AT 6 PM.) 90 tablet 0  . carvedilol (COREG) 25 MG tablet TAKE 1 TABLET BY MOUTH TWO  TIMES DAILY WITH A MEAL (Patient taking differently: TAKE 25mg  BY MOUTH TWO  TIMES DAILY WITH A MEAL) 180 tablet 0  . Fluticasone-Salmeterol (ADVAIR) 100-50 MCG/DOSE AEPB Inhale 1 puff into the lungs 2 (two) times daily. 1 each 3  . furosemide (LASIX) 40 MG tablet Take 0.5 tablets (20 mg total) by mouth daily. 90 tablet 0  . lisinopril (PRINIVIL,ZESTRIL) 40 MG tablet Take 1 tablet (40 mg total) by mouth daily. 30 tablet 0  . liver oil-zinc oxide (DESITIN) 40 %  ointment Apply 1 application topically as needed for irritation.    . Multiple Vitamin (MULTIVITAMIN) capsule Take 1 capsule by mouth daily.      . pantoprazole (PROTONIX) 40 MG tablet Take 1 tablet (40 mg total) by mouth at bedtime. 30 tablet 0  . potassium chloride SA (K-DUR,KLOR-CON) 20 MEQ tablet Take 2 tablets (40 mEq total) by mouth daily. 10 tablet 0  . rivaroxaban (XARELTO) 20 MG TABS tablet Take 1 tablet (20 mg total) by mouth daily with supper. 30 tablet 5  . witch hazel-glycerin (TUCKS) pad Apply 1 application topically as needed for itching or irritation.    . saccharomyces boulardii (FLORASTOR) 250 MG capsule Take 1 capsule (250 mg total) by mouth 2 (two) times daily. (Patient not taking: Reported on 12/20/2016) 30 capsule 0   No current facility-administered medications for this  visit.     Review of Systems  Constitutional:  Constitutional negative. HENT: HENT negative.  GI: Positive for abdominal pain.  Musculoskeletal: Positive for back pain.  Skin: Skin negative.  Neurological: Neurological negative. Hematologic: Hematologic/lymphatic negative.        Objective:  Objective   Vitals:   12/20/16 1058 12/20/16 1059  BP: (!) 153/88 (!) 150/86  Pulse: 66   Resp: 20   Temp: 97.6 F (36.4 C)   TempSrc: Oral   SpO2: 97%   Weight: 146 lb (66.2 kg)   Height: 5\' 11"  (1.803 m)    Body mass index is 20.36 kg/m.  Physical Exam  Constitutional: He is oriented to person, place, and time. He appears well-developed.  HENT:  Head: Normocephalic.  Eyes: Pupils are equal, round, and reactive to light.  Neck: Normal range of motion.  Cardiovascular: Normal rate.  Pulses:      Femoral pulses are 2+ on the right side, and 2+ on the left side.      Popliteal pulses are 2+ on the right side, and 2+ on the left side.  Pulmonary/Chest: Effort normal.  Abdominal: Soft. He exhibits no mass.  Musculoskeletal: Normal range of motion.  Neurological: He is alert and oriented to person, place, and time.  Skin: Skin is warm and dry.  Psychiatric: He has a normal mood and affect. His behavior is normal. Judgment and thought content normal.    Data: IMPRESSION: 1. Increasing inflammatory changes from recent acute sigmoid diverticulitis, now with new 5.5 x 7.6 x 5 cm ill-defined phlegmon with foci of free air adjacent to the sigmoid colon. No discrete abscess identified. No evidence of bowel obstruction. 2. 5.5 cm infrarenal suprailiac abdominal aortic aneurysm, unchanged from 11/15/2016, but significantly increased from 2005. As noted previously, vascular surgery consultation recommended if not performed. 3. Common iliac artery aneurysms as described 4. Marked prostate enlargement and small bilateral inguinal hernias containing fat 5. Benign right adrenal  mass/adenoma.     Assessment/Plan:     71 year old male with history of abdominal aortic aneurysm that appears to be a candidate for endovascular repair.  He will need coil embolization of his right hypogastric artery he does have erectile dysfunction I discussed that this may make this worse.  He also has an AICD in place followed by Dr. [name].  He wants to wait until after Christmas to have this aneurysm repaired which I certainly think is reasonable given the low risk of rupture and I discussed with him that any new back or abdominal pain he should be seen immediately in the emergency department this could be related to either his aneurysm  or his diverticulitis.  I will have him evaluated by cardiology we will get him scheduled for after the new year at his request.    He will need to hold Xarelto times 48 hours prior to the procedure.    Waynetta Sandy MD [facility]

## 2016-12-24 ENCOUNTER — Ambulatory Visit: Payer: Medicare Other | Admitting: Endocrinology

## 2016-12-24 ENCOUNTER — Encounter: Payer: Self-pay | Admitting: Endocrinology

## 2016-12-24 DIAGNOSIS — Z0001 Encounter for general adult medical examination with abnormal findings: Secondary | ICD-10-CM

## 2016-12-24 DIAGNOSIS — E279 Disorder of adrenal gland, unspecified: Principal | ICD-10-CM

## 2016-12-24 DIAGNOSIS — E278 Other specified disorders of adrenal gland: Secondary | ICD-10-CM

## 2016-12-24 DIAGNOSIS — M545 Low back pain: Secondary | ICD-10-CM

## 2016-12-24 MED ORDER — LIDOCAINE 5 % EX PTCH
1.0000 | MEDICATED_PATCH | CUTANEOUS | 11 refills | Status: DC
Start: 2016-12-24 — End: 2017-02-05

## 2016-12-24 NOTE — Patient Instructions (Addendum)
Please consider these measures for your health:  minimize alcohol.  Do not use tobacco products.  Have a colonoscopy at least every 10 years from age 71.   Keep firearms safely stored.  Always use seat belts.  have working smoke alarms in your home.  See an eye doctor and dentist regularly.  Never drive under the influence of alcohol or drugs (including prescription drugs).   I have sent a prescription to your pharmacy, for a pain patch for your back.  Please call us if you want to see a specialist for your back.   Please come back for a follow-up appointment in 6 months.

## 2016-12-24 NOTE — Progress Notes (Signed)
Subjective:    Patient ID: Benjamin Cook, male    DOB: 1945/02/10, 71 y.o.   MRN: [id number]  HPI Pt is here for regular wellness examination, and is feeling pretty well in general, and says chronic med probs are stable, except as noted below Past Medical History:  Diagnosis Date  . Adrenal mass ([place])    per pt this is remote (10 years) and benign by biopsy  . AICD (automatic cardioverter/defibrillator) present   . Anxiety   . CAD (coronary artery disease)    a. s/p PCI to LAD 2001 b. myoview 2014 high risk with scar LAD/RCA territory but no ischemia  . Cardiomyopathy, ischemic   . CHF (congestive heart failure) ([name])    class II/III  . COPD (chronic obstructive pulmonary disease) ([name])    smokes cigars but has quit cigarettes  . Flu 03/2015  . HTN (hypertension)   . Hyperlipidemia   . Paroxysmal atrial fibrillation ([name]) 03/2015   chads2vasc score is 4  . PSA (psoriatic arthritis) ([place])    increased  . PVD (peripheral vascular disease) ([place])     Past Surgical History:  Procedure Laterality Date  . cataract surgery    . CORONARY ANGIOPLASTY WITH STENT PLACEMENT  2001   a. PCI to LAD  . IMPLANTABLE CARDIOVERTER DEFIBRILLATOR (ICD) GENERATOR CHANGE N/A 03/18/2014   a. SJM Fortify ST DR ICD implanted by [facility] for primary prevention b. gen change 03/2014   . LEAD REVISION/REPAIR N/A 07/17/2016   Procedure: Lead Revision/Repair;  Surgeon: Evans Lance, MD;  Location: [facility];  Service: Cardiovascular;  Laterality: N/A;  . LEFT HEART CATHETERIZATION WITH CORONARY ANGIOGRAM N/A 05/27/2014   Procedure: LEFT HEART CATHETERIZATION WITH CORONARY ANGIOGRAM;  Surgeon: Sherren Mocha, MD;  Location: [facility] CATH LAB;  Service: Cardiovascular;  Laterality: N/A;    Social History   Socioeconomic History  . Marital status: Married    Spouse name: Not on file  . Number of children: Not on file  . Years of education: Not on file  . Highest education level: Not on file  Social  Needs  . Financial resource strain: Not on file  . Food insecurity - worry: Not on file  . Food insecurity - inability: Not on file  . Transportation needs - medical: Not on file  . Transportation needs - non-medical: Not on file  Occupational History  . Occupation: Lawn care  Tobacco Use  . Smoking status: Current Every Day Smoker    Types: Cigars  . Smokeless tobacco: Never Used  . Tobacco comment: smokes cigars now, no longer smokes cigarettes but previously smoked 2ppd  Substance and Sexual Activity  . Alcohol use: No  . Drug use: No  . Sexual activity: Not Currently    Partners: Female    Birth control/protection: None  Other Topics Concern  . Not on file  Social History Narrative  . Not on file    Current Outpatient Medications on File Prior to Visit  Medication Sig Dispense Refill  . atorvastatin (LIPITOR) 80 MG tablet TAKE 1 TABLET BY MOUTH  DAILY AT 6 PM. (Patient taking differently: TAKE 80mg  BY MOUTH  DAILY AT 6 PM.) 90 tablet 0  . carvedilol (COREG) 25 MG tablet TAKE 1 TABLET BY MOUTH TWO  TIMES DAILY WITH A MEAL (Patient taking differently: TAKE 25mg  BY MOUTH TWO  TIMES DAILY WITH A MEAL) 180 tablet 0  . Fluticasone-Salmeterol (ADVAIR) 100-50 MCG/DOSE AEPB Inhale 1 puff into the lungs  2 (two) times daily. 1 each 3  . furosemide (LASIX) 40 MG tablet Take 0.5 tablets (20 mg total) by mouth daily. 90 tablet 0  . lisinopril (PRINIVIL,ZESTRIL) 40 MG tablet Take 1 tablet (40 mg total) by mouth daily. 30 tablet 0  . liver oil-zinc oxide (DESITIN) 40 % ointment Apply 1 application topically as needed for irritation.    . Multiple Vitamin (MULTIVITAMIN) capsule Take 1 capsule by mouth daily.      . pantoprazole (PROTONIX) 40 MG tablet Take 1 tablet (40 mg total) by mouth at bedtime. 30 tablet 0  . potassium chloride SA (K-DUR,KLOR-CON) 20 MEQ tablet Take 2 tablets (40 mEq total) by mouth daily. 10 tablet 0  . rivaroxaban (XARELTO) 20 MG TABS tablet Take 1 tablet (20 mg total)  by mouth daily with supper. 30 tablet 5  . witch hazel-glycerin (TUCKS) pad Apply 1 application topically as needed for itching or irritation.     No current facility-administered medications on file prior to visit.     No Known Allergies  Family History  Problem Relation Age of Onset  . Cirrhosis Mother        due to ETOH  . Cancer Neg Hx     BP (!) 142/82 (BP Location: Right Arm, Patient Position: Sitting, Cuff Size: Normal)   Pulse 68   Wt 145 lb 9.6 oz (66 kg)   SpO2 96%   BMI 20.31 kg/m     Review of Systems Denies fever, visual loss, chest pain, sob, depression, cold intolerance, BRBPR, hematuria, syncope, numbness, allergy sxs, easy bruising, and rash. He has fatigue, and slight hearing loss.       Objective:   Physical Exam VS: see vs page GEN: no distress HEAD: head: no deformity eyes: no periorbital swelling, no proptosis external nose and ears are normal mouth: no lesion seen NECK: supple, thyroid is not enlarged CHEST WALL: no deformity LUNGS: clear to auscultation BREASTS:  No gynecomastia CV: reg rate and rhythm, no murmur ABD: abdomen is soft, nontender.  no hepatosplenomegaly.  not distended.  no hernia.   RECTAL/PROSTATE: declined.  MUSCULOSKELETAL: muscle bulk and strength are grossly normal.  no obvious joint swelling.  gait is normal and steady.  EXTEMITIES: no deformity.  no ulcer on the feet.  feet are of normal color and temp.  no edema PULSES: dorsalis pedis intact bilat.  no carotid bruit NEURO:  cn 2-12 grossly intact.   readily moves all 4's.  sensation is intact to touch on the feet SKIN:  Normal texture and temperature.  No rash or suspicious lesion is visible.   NODES:  None palpable at the neck.   PSYCH: alert, well-oriented.  Does not appear anxious nor depressed.        Assessment & Plan:  Wellness visit today, with problems stable, except as noted.  SEPARATE EVALUATION FOLLOWS--EACH PROBLEM HERE IS NEW, NOT RESPONDING TO  TREATMENT, OR POSES SIGNIFICANT RISK TO THE PATIENT'S HEALTH: HISTORY OF THE PRESENT ILLNESS:  Pt states of moderate pain at the lower back, but no assoc urinary retention.  PAST MEDICAL HISTORY Past Medical History:  Diagnosis Date  . Adrenal mass ([place])    per pt this is remote (10 years) and benign by biopsy  . AICD (automatic cardioverter/defibrillator) present   . Anxiety   . CAD (coronary artery disease)    a. s/p PCI to LAD 2001 b. myoview 2014 high risk with scar LAD/RCA territory but no ischemia  . Cardiomyopathy, ischemic   .  CHF (congestive heart failure) ([name])    class II/III  . COPD (chronic obstructive pulmonary disease) ([name])    smokes cigars but has quit cigarettes  . Flu 03/2015  . HTN (hypertension)   . Hyperlipidemia   . Paroxysmal atrial fibrillation ([name]) 03/2015   chads2vasc score is 4  . PSA (psoriatic arthritis) ([place])    increased  . PVD (peripheral vascular disease) ([place])     Past Surgical History:  Procedure Laterality Date  . cataract surgery    . CORONARY ANGIOPLASTY WITH STENT PLACEMENT  2001   a. PCI to LAD  . IMPLANTABLE CARDIOVERTER DEFIBRILLATOR (ICD) GENERATOR CHANGE N/A 03/18/2014   a. SJM Fortify ST DR ICD implanted by [facility] for primary prevention b. gen change 03/2014   . LEAD REVISION/REPAIR N/A 07/17/2016   Procedure: Lead Revision/Repair;  Surgeon: Evans Lance, MD;  Location: [facility];  Service: Cardiovascular;  Laterality: N/A;  . LEFT HEART CATHETERIZATION WITH CORONARY ANGIOGRAM N/A 05/27/2014   Procedure: LEFT HEART CATHETERIZATION WITH CORONARY ANGIOGRAM;  Surgeon: Sherren Mocha, MD;  Location: [facility] CATH LAB;  Service: Cardiovascular;  Laterality: N/A;    Social History   Socioeconomic History  . Marital status: Married    Spouse name: Not on file  . Number of children: Not on file  . Years of education: Not on file  . Highest education level: Not on file  Social Needs  . Financial resource strain: Not on file  . Food  insecurity - worry: Not on file  . Food insecurity - inability: Not on file  . Transportation needs - medical: Not on file  . Transportation needs - non-medical: Not on file  Occupational History  . Occupation: Lawn care  Tobacco Use  . Smoking status: Current Every Day Smoker    Types: Cigars  . Smokeless tobacco: Never Used  . Tobacco comment: smokes cigars now, no longer smokes cigarettes but previously smoked 2ppd  Substance and Sexual Activity  . Alcohol use: No  . Drug use: No  . Sexual activity: Not Currently    Partners: Female    Birth control/protection: None  Other Topics Concern  . Not on file  Social History Narrative  . Not on file    Current Outpatient Medications on File Prior to Visit  Medication Sig Dispense Refill  . atorvastatin (LIPITOR) 80 MG tablet TAKE 1 TABLET BY MOUTH  DAILY AT 6 PM. (Patient taking differently: TAKE 80mg  BY MOUTH  DAILY AT 6 PM.) 90 tablet 0  . carvedilol (COREG) 25 MG tablet TAKE 1 TABLET BY MOUTH TWO  TIMES DAILY WITH A MEAL (Patient taking differently: TAKE 25mg  BY MOUTH TWO  TIMES DAILY WITH A MEAL) 180 tablet 0  . Fluticasone-Salmeterol (ADVAIR) 100-50 MCG/DOSE AEPB Inhale 1 puff into the lungs 2 (two) times daily. 1 each 3  . furosemide (LASIX) 40 MG tablet Take 0.5 tablets (20 mg total) by mouth daily. 90 tablet 0  . lisinopril (PRINIVIL,ZESTRIL) 40 MG tablet Take 1 tablet (40 mg total) by mouth daily. 30 tablet 0  . liver oil-zinc oxide (DESITIN) 40 % ointment Apply 1 application topically as needed for irritation.    . Multiple Vitamin (MULTIVITAMIN) capsule Take 1 capsule by mouth daily.      . pantoprazole (PROTONIX) 40 MG tablet Take 1 tablet (40 mg total) by mouth at bedtime. 30 tablet 0  . potassium chloride SA (K-DUR,KLOR-CON) 20 MEQ tablet Take 2 tablets (40 mEq total) by mouth daily. 10  tablet 0  . rivaroxaban (XARELTO) 20 MG TABS tablet Take 1 tablet (20 mg total) by mouth daily with supper. 30 tablet 5  . witch  hazel-glycerin (TUCKS) pad Apply 1 application topically as needed for itching or irritation.     No current facility-administered medications on file prior to visit.     No Known Allergies  Family History  Problem Relation Age of Onset  . Cirrhosis Mother        due to ETOH  . Cancer Neg Hx     BP (!) 142/82 (BP Location: Right Arm, Patient Position: Sitting, Cuff Size: Normal)   Pulse 68   Wt 145 lb 9.6 oz (66 kg)   SpO2 96%   BMI 20.31 kg/m   REVIEW OF SYSTEMS: Denies bowel retention.  PHYSICAL EXAMINATION: VITAL SIGNS:  See vs page GENERAL: no distress Spine: nontender.  LAB/XRAY RESULTS:  CT: Moderate to severe degenerative disc disease and spondylosis in the lower lumbar spine.  IMPRESSION: Low back pain, chronic.  PLAN:  I have sent a prescription to your pharmacy, for a pain patch for your back.  Please call us if you want to see a specialist for your back.

## 2016-12-28 LAB — CATECHOLAMINES, FRACTIONATED, URINE, 24 HOUR
CREATININE, URINE MG/DAY-CATEUR: 0.57 g/(24.h) (ref 0.50–2.15)
Calculated Total (E+NE): 5 mcg/24 h — ABNORMAL LOW (ref 26–121)
NOREPINEPHRINE, 24 HR UR: 5 ug/(24.h) — AB (ref 15–100)
Volume, Urine-VMAUR: 530 mL

## 2016-12-28 LAB — METANEPHRINES, URINE, 24 HOUR
Metaneph Total, Ur: 430 mcg/24 h (ref 224–832)
Metanephrines, Ur: 131 mcg/24 h (ref 90–315)
Normetanephrine, 24H Ur: 299 mcg/24 h (ref 122–676)
Volume, Urine-VMAUR: 530 mL

## 2017-01-01 ENCOUNTER — Other Ambulatory Visit: Payer: Self-pay

## 2017-01-01 ENCOUNTER — Encounter: Payer: Self-pay | Admitting: Endocrinology

## 2017-01-01 DIAGNOSIS — M545 Low back pain, unspecified: Secondary | ICD-10-CM | POA: Insufficient documentation

## 2017-01-01 MED ORDER — CARVEDILOL 25 MG PO TABS: 25.0000 mg | ORAL_TABLET | Freq: Two times a day (BID) | ORAL | 3 refills | Status: DC

## 2017-01-01 MED ORDER — FUROSEMIDE 40 MG PO TABS: 20.0000 mg | ORAL_TABLET | Freq: Every day | ORAL | 3 refills | Status: DC

## 2017-01-01 MED ORDER — LISINOPRIL 40 MG PO TABS: 40.0000 mg | ORAL_TABLET | Freq: Every day | ORAL | 11 refills | Status: DC

## 2017-01-01 MED ORDER — ATORVASTATIN CALCIUM 80 MG PO TABS: ORAL_TABLET | ORAL | 2 refills | Status: DC

## 2017-01-01 MED ORDER — POTASSIUM CHLORIDE CRYS ER 20 MEQ PO TBCR: 20.0000 meq | EXTENDED_RELEASE_TABLET | Freq: Every day | ORAL | 2 refills | Status: DC

## 2017-01-06 ENCOUNTER — Other Ambulatory Visit: Payer: Self-pay | Admitting: Endocrinology

## 2017-01-14 ENCOUNTER — Ambulatory Visit: Payer: Medicare Other | Admitting: Physician Assistant

## 2017-01-20 ENCOUNTER — Ambulatory Visit (INDEPENDENT_AMBULATORY_CARE_PROVIDER_SITE_OTHER): Payer: Medicare Other | Admitting: *Deleted

## 2017-01-20 DIAGNOSIS — Z95810 Presence of automatic (implantable) cardiac defibrillator: Secondary | ICD-10-CM

## 2017-01-20 DIAGNOSIS — I5022 Chronic systolic (congestive) heart failure: Secondary | ICD-10-CM

## 2017-01-20 DIAGNOSIS — I255 Ischemic cardiomyopathy: Secondary | ICD-10-CM

## 2017-01-20 NOTE — Progress Notes (Signed)
EPIC Encounter for ICM Monitoring  Patient Name: Benjamin Cook is a 71 y.o. male Date:   01/20/2017 Primary Care Physican: Renato Shin, MD Primary Cardiologist:[place] Electrophysiologist: Allred Dry Weight: unknown                                               Spoke with wife.  She reported patient is doing fine and denied any has any fluid symptoms at this time.   Hospitalized for nausea/vomiting from 10/12 to 10/15 and readmitted 11/24/2016 - 11/30/2016   [place]: Thoracic impedance normal  Prescribed dosage: Furosemide 40 mg0.5tablet (20 mg total)daily. Potassium 20 mEq 1 tablet daily.  Labs: 11/30/2016 Creatinine 0.81, BUN <5, Potassium 3.8, Sodium 140, EGFR >60 11/29/2016 Creatinine 0.83, BUN 5,   Potassium 3.6, Sodium 140, EGFR >60  11/28/2016 Creatinine 0.82, BUN 6,   Potassium 3.7, Sodium 140, EGFR >60  11/27/2016 Creatinine 0.78, BUN 8,   Potassium 3.1, Sodium 139, EGFR >60  11/26/2016 Creatinine 0.90, BUN 6,   Potassium 3.7, Sodium 136, EGFR >60  11/25/2016 Creatinine 0.79, BUN 7,   Potassium 3.8, Sodium 134, EGFR >60  11/24/2016 Creatinine 0.81, BUN 7,   Potassium 3.9, Sodium 135, EGFR >60  11/18/2016 Creatinine0.70, BUN<5, Potassium3.2, [id number], EGFR>60 11/17/2016 Creatinine0.68, BUN<5, Potassium3.2, Sodium136, EGFR>60  11/16/2016 Creatinine0.83, BUN7, Potassium 3.4, Sodium136, EGFR>60  11/15/2016 Creatinine0.71, BUN7, Potassium 4.0, Sodium136, EGFR>60 07/17/2016 Creatinine 0.86, BUN 10, Potassium 3.4, Sodium 39, EGFR >60 09/15/2015 Creatinine 0.88, BUN 10, Potassium 4.6, Sodium 142  Recommendations: NONE - Unable to reach.  Follow-up plan: ICM clinic phone appointment on 02/20/2017.    Copy of ICM check sent to Dr. Rayann Heman.   3 month ICM trend: 01/20/2017    1 Year ICM trend:       [name], RN 01/20/2017 3:31 PM

## 2017-01-20 NOTE — Progress Notes (Signed)
Remote ICD transmission.   

## 2017-01-22 ENCOUNTER — Encounter: Payer: Self-pay | Admitting: Cardiology

## 2017-01-22 LAB — CUP PACEART REMOTE DEVICE CHECK
Battery Remaining Longevity: 68 mo
Battery Remaining Percentage: 69 %
Battery Voltage: 2.99 V
Brady Statistic AS VP Percent: 1 %
Brady Statistic RA Percent Paced: 7.9 %
HIGH POWER IMPEDANCE MEASURED VALUE: 69 Ohm
HighPow Impedance: 69 Ohm
Implantable Lead Implant Date: 20180613
Implantable Lead Location: 753859
Lead Channel Impedance Value: 340 Ohm
Lead Channel Impedance Value: 740 Ohm
Lead Channel Pacing Threshold Amplitude: 0.5 V
Lead Channel Pacing Threshold Pulse Width: 0.5 ms
Lead Channel Sensing Intrinsic Amplitude: 11.6 mV
Lead Channel Setting Pacing Amplitude: 2 V
Lead Channel Setting Pacing Pulse Width: 0.5 ms
Lead Channel Setting Sensing Sensitivity: 0.5 mV
MDC IDC LEAD IMPLANT DT: 20120420
MDC IDC LEAD LOCATION: 753860
MDC IDC MSMT LEADCHNL RA PACING THRESHOLD AMPLITUDE: 0.5 V
MDC IDC MSMT LEADCHNL RA SENSING INTR AMPL: 2 mV
MDC IDC MSMT LEADCHNL RV PACING THRESHOLD PULSEWIDTH: 0.5 ms
MDC IDC PG IMPLANT DT: 20160212
MDC IDC SESS DTM: 20181217100642
MDC IDC SET LEADCHNL RV PACING AMPLITUDE: 2.5 V
MDC IDC STAT BRADY AP VP PERCENT: 1 %
MDC IDC STAT BRADY AP VS PERCENT: 16 %
MDC IDC STAT BRADY AS VS PERCENT: 79 %
MDC IDC STAT BRADY RV PERCENT PACED: 1 %
Pulse Gen Serial Number: 7225079

## 2017-02-04 ENCOUNTER — Other Ambulatory Visit: Payer: Self-pay | Admitting: Endocrinology

## 2017-02-05 ENCOUNTER — Encounter: Payer: Self-pay | Admitting: Physician Assistant

## 2017-02-05 ENCOUNTER — Ambulatory Visit: Payer: Medicare Other | Admitting: Physician Assistant

## 2017-02-05 VITALS — BP 124/72 | HR 76 | Ht 71.0 in | Wt 148.0 lb

## 2017-02-05 DIAGNOSIS — I714 Abdominal aortic aneurysm, without rupture, unspecified: Secondary | ICD-10-CM

## 2017-02-05 DIAGNOSIS — I10 Essential (primary) hypertension: Secondary | ICD-10-CM | POA: Diagnosis not present

## 2017-02-05 DIAGNOSIS — Z01810 Encounter for preprocedural cardiovascular examination: Secondary | ICD-10-CM

## 2017-02-05 DIAGNOSIS — J449 Chronic obstructive pulmonary disease, unspecified: Secondary | ICD-10-CM

## 2017-02-05 DIAGNOSIS — I2510 Atherosclerotic heart disease of native coronary artery without angina pectoris: Secondary | ICD-10-CM | POA: Diagnosis not present

## 2017-02-05 DIAGNOSIS — E785 Hyperlipidemia, unspecified: Secondary | ICD-10-CM | POA: Diagnosis not present

## 2017-02-05 DIAGNOSIS — I255 Ischemic cardiomyopathy: Secondary | ICD-10-CM | POA: Diagnosis not present

## 2017-02-05 NOTE — Progress Notes (Signed)
Cardiology Office Note    Date:  02/06/2017   ID:  KAENAN Cook, DOB 1945/03/11, MRN [id number]  PCP:  Renato Shin, MD  Cardiologist: Dr. [name] Electrophysiologist: Dr. Rayann Heman  Chief Complaint  Patient presents with  . Follow-up    seen for Dr. [name]  . Pre-op Exam    pending vascular surgery by Dr. Donzetta Matters    History of Present Illness:  Benjamin Cook is a 72 y.o. male with PMH of CAD, ICM s/p ICD, HTN, HLD and COPD (noted on PFT 04/2015).  He underwent cardiac catheterization in June 2001, this revealed 95% mid LAD and 70% diffuse RCA disease.  EF was 25-30%.  He underwent PCI of his LAD.  Abdominal ultrasound in September 2014 showed a 4.1 x 4.1 cm aneurysm in the dilated right iliac artery.  Last cardiac catheterization in 2016 showed diffuse nonobstructive CAD with EF 35%.  Last echocardiogram in February 2017 showed EF 30-35%, mild LAE and a grade 1 DD.  She also had paroxysmal atrial fibrillation noted on previous ICD interrogation.  He was placed on beta-blocker and Xarelto.  He was last seen by Dr. [name] in 2017.  AAA ultrasound obtained on 11/26/2015 showed stable infrarenal fusiform AAA measuring 4.1 x 4.2 cm was moderate thrombus.  Dilated common iliac artery.  He had a lower extremity arterial Doppler obtained on 11/16/2016 that showed no evidence of bilateral popliteal artery aneurysm, significant calcification of popliteal artery noted.  Right popliteal artery waveform is dampened monophasic.  More recently, patient was admitted in October 2018 for diverticulitis and discharged home on antibiotics.  However he never filled the prescription for Augmentin, he returned in late October with worsening abdominal discomfort and diarrhea.  CT showed worsening sigmoid diverticulitis with microperforation.  He was also noted to have 5.5 cm infrarenal suprailiac AAA, this has increased from the previous ultrasound study.  Due to concern of possible occult intervention  diverticulitis, cardiology was consulted at the time.  Dr. Oval Linsey cleared the patient to proceed with surgery with understanding he is a moderate risk patient.  Since discharge, patient has been evaluated by Dr. Donzetta Matters of vascular surgery.  It was recommended for the patient to undergo endovascular repair and also coil embolization of his right hypogastric artery.  Patient presents today for cardiology office visit and one-year follow-up.  Most recent device interrogation on 01/20/2017 showed stable volume status.  Per patient, he continued to work on the farm.  He often carries chopped woods back to the house without any issue.  He denies any recent exertional chest pain.  He has some baseline dyspnea related to COPD, this has not changed.  As mentioned previously by Dr. Oval Linsey during recent preoperative clearance in October, the main risk associated with the procedure is his pulmonary issue and any additional cardiac workup in the absence of any symptoms change likely would not reduce his overall risk.  Patient is cleared to proceed he was cardiovascular surgery, he is at least a moderate to high risk patient for high risk procedure.    Past Medical History:  Diagnosis Date  . Adrenal mass ([place])    per pt this is remote (10 years) and benign by biopsy  . AICD (automatic cardioverter/defibrillator) present   . Anxiety   . CAD (coronary artery disease)    a. s/p PCI to LAD 2001 b. myoview 2014 high risk with scar LAD/RCA territory but no ischemia  . Cardiomyopathy, ischemic   . CHF (congestive heart  failure) ([name])    class II/III  . COPD (chronic obstructive pulmonary disease) ([name])    smokes cigars but has quit cigarettes  . Flu 03/2015  . HTN (hypertension)   . Hyperlipidemia   . Paroxysmal atrial fibrillation ([name]) 03/2015   chads2vasc score is 4  . PSA (psoriatic arthritis) ([place])    increased  . PVD (peripheral vascular disease) ([place])     Past Surgical History:  Procedure Laterality  Date  . cataract surgery    . CORONARY ANGIOPLASTY WITH STENT PLACEMENT  2001   a. PCI to LAD  . IMPLANTABLE CARDIOVERTER DEFIBRILLATOR (ICD) GENERATOR CHANGE N/A 03/18/2014   a. SJM Fortify ST DR ICD implanted by [facility] for primary prevention b. gen change 03/2014   . LEAD REVISION/REPAIR N/A 07/17/2016   Procedure: Lead Revision/Repair;  Surgeon: Evans Lance, MD;  Location: [facility];  Service: Cardiovascular;  Laterality: N/A;  . LEFT HEART CATHETERIZATION WITH CORONARY ANGIOGRAM N/A 05/27/2014   Procedure: LEFT HEART CATHETERIZATION WITH CORONARY ANGIOGRAM;  Surgeon: Sherren Mocha, MD;  Location: [facility] CATH LAB;  Service: Cardiovascular;  Laterality: N/A;    Current Medications: Outpatient Medications Prior to Visit  Medication Sig Dispense Refill  . atorvastatin (LIPITOR) 80 MG tablet TAKE 1 TABLET BY MOUTH DAILY AT 6 PM. 90 tablet 2  . carvedilol (COREG) 25 MG tablet Take 1 tablet (25 mg total) by mouth 2 (two) times daily with a meal. 180 tablet 3  . Fluticasone-Salmeterol (ADVAIR) 100-50 MCG/DOSE AEPB Inhale 1 puff into the lungs 2 (two) times daily. 1 each 3  . furosemide (LASIX) 40 MG tablet Take 0.5 tablets (20 mg total) by mouth daily. 90 tablet 3  . lisinopril (PRINIVIL,ZESTRIL) 40 MG tablet Take 1 tablet (40 mg total) by mouth daily. 30 tablet 11  . liver oil-zinc oxide (DESITIN) 40 % ointment Apply 1 application topically as needed for irritation.    . Multiple Vitamin (MULTIVITAMIN) capsule Take 1 capsule by mouth daily.      . pantoprazole (PROTONIX) 40 MG tablet TAKE 1 TABLET BY MOUTH EVERYDAY AT BEDTIME 30 tablet 0  . potassium chloride SA (K-DUR,KLOR-CON) 20 MEQ tablet Take 1 tablet (20 mEq total) by mouth daily. 90 tablet 2  . rivaroxaban (XARELTO) 20 MG TABS tablet Take 1 tablet (20 mg total) by mouth daily with supper. 30 tablet 5  . witch hazel-glycerin (TUCKS) pad Apply 1 application topically as needed for itching or irritation.    . lidocaine (LIDODERM) 5 %  Place 1 patch onto the skin daily. Remove & Discard patch within 12 hours or as directed by MD 30 patch 11   No facility-administered medications prior to visit.      Allergies:   Patient has no known allergies.   Social History   Socioeconomic History  . Marital status: Married    Spouse name: None  . Number of children: None  . Years of education: None  . Highest education level: None  Social Needs  . Financial resource strain: None  . Food insecurity - worry: None  . Food insecurity - inability: None  . Transportation needs - medical: None  . Transportation needs - non-medical: None  Occupational History  . Occupation: Lawn care  Tobacco Use  . Smoking status: Current Every Day Smoker    Types: Cigars  . Smokeless tobacco: Never Used  . Tobacco comment: smokes cigars now, no longer smokes cigarettes but previously smoked 2ppd  Substance and Sexual Activity  .  Alcohol use: No  . Drug use: No  . Sexual activity: Not Currently    Partners: Female    Birth control/protection: None  Other Topics Concern  . None  Social History Narrative  . None     Family History:  The patient's family history includes Cirrhosis in his mother.   ROS:   Please see the history of present illness.    ROS All other systems reviewed and are negative.   PHYSICAL EXAM:   VS:  BP 124/72   Pulse 76   Ht 5\' 11"  (1.803 m)   Wt 148 lb (67.1 kg)   BMI 20.64 kg/m    GEN: Well nourished, well developed, in no acute distress  HEENT: normal  Neck: no JVD, carotid bruits, or masses Cardiac: RRR; no murmurs, rubs, or gallops,no edema  +ICD present Respiratory:  clear to auscultation bilaterally, normal work of breathing GI: soft, nontender, nondistended, + BS MS: no deformity or atrophy  Skin: warm and dry, no rash Neuro:  Alert and Oriented x 3, Strength and sensation are intact Psych: euthymic mood, full affect  Wt Readings from Last 3 Encounters:  02/05/17 148 lb (67.1 kg)  12/24/16  145 lb 9.6 oz (66 kg)  12/20/16 146 lb (66.2 kg)      Studies/Labs Reviewed:   EKG:  EKG is ordered today.  The ekg ordered today demonstrates sinus rhythm with TWI in inferolateral leads. Unchanged from previous EKG  Recent Labs: 11/25/2016: ALT 12 11/29/2016: Magnesium 1.9 11/30/2016: BUN <5; Creatinine, Ser 0.81; Hemoglobin 13.2; Platelets 270; Potassium 3.8; Sodium 140 12/09/2016: TSH 0.64   Lipid Panel    Component Value Date/Time   CHOL 114 12/09/2016 1100   TRIG 104.0 12/09/2016 1100   HDL 32.10 (L) 12/09/2016 1100   CHOLHDL 4 12/09/2016 1100   VLDL 20.8 12/09/2016 1100   LDLCALC 61 12/09/2016 1100    Additional studies/ records that were reviewed today include:   Echo 03/29/2015 LV EF: 30% -   35%  Study Conclusions  - Left ventricle: The cavity size was normal. Wall thickness was   increased in a pattern of mild LVH. Systolic function was   moderately to severely reduced. The estimated ejection fraction   was in the range of 30% to 35%. There is akinesis of the   anteroseptal and apical myocardium. Doppler parameters are   consistent with abnormal left ventricular relaxation (grade 1   diastolic dysfunction). - Left atrium: The atrium was mildly dilated.  Impressions:  - Akinesis of the anteroseptal and apical walls with moderate to   severe LV dysfunction; grade 1 diastolic dysfunction; mild LAE;   trace MR and TR; no LV apical thrombus noted using definity.   CT Abdomen pelvis 11/24/2016 IMPRESSION: 1. Increasing inflammatory changes from recent acute sigmoid diverticulitis, now with new 5.5 x 7.6 x 5 cm ill-defined phlegmon with foci of free air adjacent to the sigmoid colon. No discrete abscess identified. No evidence of bowel obstruction. 2. 5.5 cm infrarenal suprailiac abdominal aortic aneurysm, unchanged from 11/15/2016, but significantly increased from 2005. As noted previously, vascular surgery consultation recommended if  not performed. 3. Common iliac artery aneurysms as described 4. Marked prostate enlargement and small bilateral inguinal hernias containing fat 5. Benign right adrenal mass/adenoma.   ASSESSMENT:    1. Preop cardiovascular exam   2. Coronary artery disease involving native coronary artery of native heart without angina pectoris   3. Ischemic cardiomyopathy   4. Essential hypertension  5. Hyperlipidemia, unspecified hyperlipidemia type   6. Chronic obstructive pulmonary disease, unspecified COPD type ([place])   7. AAA (abdominal aortic aneurysm) without rupture ([name])      PLAN:  In order of problems listed above:  1. Preoperative clearance: requested by Dr. Donzetta Matters, noted recently on CT his infrarenal AAA has progressed to 5.5cm. Pending endovascular repair and coiling of hypogastric artery by Dr. Donzetta Matters.  Patient was also recently cleared for possible laparoscopic surgery by Dr. Oval Linsey in October during hospitalization for diverticulitis, fortunately, he recovered without the need for surgery.  Since October, he has not had any new issues.  He has baseline dyspnea due to COPD, however he denies any anginal symptom.  His cardiac condition has not changed in the past 2 months, therefore no further workup is expected prior to surgery.  He is a moderate to high risk patient for high risk procedure, patient understands this as well. He has Class IV RCRI risk with 11% risk of major cardiac event, NSQIP cardiac complication 2.6%.  2. Chronic systolic heart failure: Baseline EF 35%, euvolemic on physical exam.  No recent acute heart failure.  3. ICM s/p ICD: Interrogation in December showed normal device function.  4. CAD s/p PCI of LAD in 2001: Last cardiac catheterization in 2016 showed diffuse nonobstructive CAD with EF 35%.   5. PAF: on Xarelto: continue coreg, maintaining sinus rhythm on EKG. Need to hold Xarelto for 3 days prior to surgery.   6. HTN: Blood pressure well controlled continue  heart failure medication including carvedilol and lisinopril.  7. HLD: On Lipitor 80 mg daily, last lipid panel obtained in November 2018 showed cholesterol 114, triglyceride 104, HDL 32, LDL 61.  Other than low HDL, LDL is very well controlled.    Medication Adjustments/Labs and Tests Ordered: Current medicines are reviewed at length with the patient today.  Concerns regarding medicines are outlined above.  Medication changes, Labs and Tests ordered today are listed in the Patient Instructions below. Patient Instructions  Medication Instructions:   No changes  Labwork:   none  Testing/Procedures:  none  Follow-Up:  Isaac Laud has cleared you for your upcoming vascular surgery. We will contact the surgeon's office to notify them.  Follow up in 6 to 9 months with Dr. [name]  If you need a refill on your cardiac medications before your next appointment, please call your pharmacy.    Hilbert Corrigan, [place]  02/06/2017 11:49 PM    [address], [place], [place]  [address] Phone: [phone]; Fax: [phone]

## 2017-02-05 NOTE — Patient Instructions (Signed)
Medication Instructions:   No changes  Labwork:   none  Testing/Procedures:  none  Follow-Up:  Benjamin Cook has cleared you for your upcoming vascular surgery. We will contact the surgeon's office to notify them.  Follow up in 6 to 9 months with Dr. [name]  If you need a refill on your cardiac medications before your next appointment, please call your pharmacy.

## 2017-02-06 ENCOUNTER — Encounter: Payer: Self-pay | Admitting: Physician Assistant

## 2017-02-20 ENCOUNTER — Ambulatory Visit (INDEPENDENT_AMBULATORY_CARE_PROVIDER_SITE_OTHER): Payer: Medicare Other

## 2017-02-20 DIAGNOSIS — Z95810 Presence of automatic (implantable) cardiac defibrillator: Secondary | ICD-10-CM

## 2017-02-20 DIAGNOSIS — I5022 Chronic systolic (congestive) heart failure: Secondary | ICD-10-CM

## 2017-02-20 NOTE — Progress Notes (Signed)
EPIC Encounter for ICM Monitoring  Patient Name: Benjamin Cook is a 72 y.o. male Date: 02/20/2017 Primary Care Physican: Renato Shin, MD Primary Cardiologist:[place] Electrophysiologist: Allred Dry Weight: unknown  Spoke with wife.  Heart Failure questions reviewed, pt asymptomatic.   Thoracic impedance normal but was abnormal suggesting fluid accumulation from 02/14/17 to 02/19/17.  Prescribed dosage: Furosemide 40 mg0.5tablet (20 mg total)daily. Potassium 20 mEq 1 tablet daily.  Labs: 11/30/2016 Creatinine0.81, BUN<5, Potassium3.8, Sodium140, EGFR>60 11/29/2016 Creatinine0.83, BUN5, Potassium 3.6, Sodium140, EGFR>60  11/28/2016 Creatinine0.82, BUN6, Potassium 3.7, Sodium140, EGFR>60  11/27/2016 Creatinine0.78, BUN8, Potassium 3.1, Sodium139, EGFR>60  11/26/2016 Creatinine0.90, BUN6, Potassium 3.7, Sodium136, EGFR>60  11/25/2016 Creatinine0.79, BUN7, Potassium 3.8, Sodium134, EGFR>60  11/24/2016 Creatinine0.81, BUN7, Potassium 3.9, Sodium135, EGFR>60 11/18/2016 Creatinine0.70, BUN<5, Potassium3.2, Sodium138, EGFR>60 11/17/2016 Creatinine0.68, BUN<5, Potassium3.2, Sodium136, EGFR>60  11/16/2016 Creatinine0.83, BUN7, Potassium 3.4, Sodium136, EGFR>60  11/15/2016 Creatinine0.71, BUN7, Potassium 4.0, Sodium136, EGFR>60 07/17/2016 Creatinine 0.86, BUN 10, Potassium 3.4, Sodium 39, EGFR >60 09/15/2015 Creatinine 0.88, BUN 10, Potassium 4.6, Sodium 142  Recommendations: No changes.  Encouraged to call for fluid symptoms.  Follow-up plan: ICM clinic phone appointment on 03/06/2017 due to patient have repair of AAA on 03/03/2017.   Copy of ICM check sent to Dr. Rayann Heman.   3 month ICM trend: 02/20/2017    1 Year ICM trend:       [name], RN 02/20/2017 7:56 AM

## 2017-02-21 ENCOUNTER — Other Ambulatory Visit: Payer: Self-pay | Admitting: *Deleted

## 2017-02-24 NOTE — Pre-Procedure Instructions (Signed)
NEEKO PHARO  02/24/2017      [id number] - [address], [address] [place] [address] Phone: [phone] Fax: [phone]    Your procedure is scheduled on Jan 28  Report to [facility] [place] at 530 A.M.  Call this number if you have problems the morning of surgery:  [phone]   Remember:  Do not eat food or drink liquids after midnight.  Take these medicines the morning of surgery with A SIP OF WATER carvedilol (Coreg), advair inhaler  Stop taking xarelto as directed by your Dr. Margretta Sidle your inhalers with you on the day of surgery.  Stop taking BC's, Goody's, Herbal medications, Fish Oil, Aleve, Ibuprofen, Advil, Motrin   Do not wear jewelry, make-up or nail polish.  Do not wear lotions, powders, or perfumes, or deodorant.  Do not shave 48 hours prior to surgery.  Men may shave face and neck.  Do not bring valuables to the hospital.  [facility] is not responsible for any belongings or valuables.  Contacts, dentures or bridgework may not be worn into surgery.  Leave your suitcase in the car.  After surgery it may be brought to your room.  For patients admitted to the hospital, discharge time will be determined by your treatment team.  Patients discharged the day of surgery will not be allowed to drive home.    Special instructions: [place] - Preparing for Surgery  Before surgery, you can play an important role.  Because skin is not sterile, your skin needs to be as free of germs as possible.  You can reduce the number of germs on you skin by washing with CHG (chlorahexidine gluconate) soap before surgery.  CHG is an antiseptic cleaner which kills germs and bonds with the skin to continue killing germs even after washing.  Please DO NOT use if you have an allergy to CHG or antibacterial soaps.  If your skin becomes reddened/irritated stop using the CHG and inform your nurse when you arrive at [facility]  Stay.  Do not shave (including legs and underarms) for at least 48 hours prior to the first CHG shower.  You may shave your face.  Please follow these instructions carefully:   1.  Shower with CHG Soap the night before surgery and the                                morning of Surgery.  2.  If you choose to wash your hair, wash your hair first as usual with your       normal shampoo.  3.  After you shampoo, rinse your hair and body thoroughly to remove the                      Shampoo.  4.  Use CHG as you would any other liquid soap.  You can apply chg directly       to the skin and wash gently with scrungie or a clean washcloth.  5.  Apply the CHG Soap to your body ONLY FROM THE NECK DOWN.        Do not use on open wounds or open sores.  Avoid contact with your eyes,       ears, mouth and genitals (private parts).  Wash genitals (private parts)       with your normal soap.  6.  Wash thoroughly, paying special attention to the area where your surgery        will be performed.  7.  Thoroughly rinse your body with warm water from the neck down.  8.  DO NOT shower/wash with your normal soap after using and rinsing off       the CHG Soap.  9.  Pat yourself dry with a clean towel.            10.  Wear clean pajamas.            11.  Place clean sheets on your bed the night of your first shower and do not        sleep with pets.  Day of Surgery  Do not apply any lotions/deoderants the morning of surgery.  Please wear clean clothes to the hospital/surgery center.      Please read over the following fact sheets that you were given. Pain Booklet, Coughing and Deep Breathing, MRSA Information and Surgical Site Infection Prevention

## 2017-02-25 ENCOUNTER — Encounter (HOSPITAL_COMMUNITY)
Admission: RE | Admit: 2017-02-25 | Discharge: 2017-02-25 | Disposition: A | Discharge status: Home / Self Care | Payer: Medicare Other | Source: Ambulatory Visit | Attending: Vascular Surgery | Admitting: Vascular Surgery

## 2017-02-25 ENCOUNTER — Other Ambulatory Visit: Payer: Self-pay

## 2017-02-25 ENCOUNTER — Encounter (HOSPITAL_COMMUNITY): Payer: Self-pay

## 2017-02-25 DIAGNOSIS — I509 Heart failure, unspecified: Secondary | ICD-10-CM | POA: Insufficient documentation

## 2017-02-25 DIAGNOSIS — Z79899 Other long term (current) drug therapy: Secondary | ICD-10-CM | POA: Insufficient documentation

## 2017-02-25 DIAGNOSIS — I480 Paroxysmal atrial fibrillation: Secondary | ICD-10-CM | POA: Insufficient documentation

## 2017-02-25 DIAGNOSIS — I255 Ischemic cardiomyopathy: Secondary | ICD-10-CM | POA: Diagnosis not present

## 2017-02-25 DIAGNOSIS — E785 Hyperlipidemia, unspecified: Secondary | ICD-10-CM | POA: Insufficient documentation

## 2017-02-25 DIAGNOSIS — Z01818 Encounter for other preprocedural examination: Secondary | ICD-10-CM | POA: Insufficient documentation

## 2017-02-25 DIAGNOSIS — I2510 Atherosclerotic heart disease of native coronary artery without angina pectoris: Secondary | ICD-10-CM | POA: Insufficient documentation

## 2017-02-25 DIAGNOSIS — I110 Hypertensive heart disease with heart failure: Secondary | ICD-10-CM | POA: Diagnosis not present

## 2017-02-25 DIAGNOSIS — Z0183 Encounter for blood typing: Secondary | ICD-10-CM | POA: Insufficient documentation

## 2017-02-25 DIAGNOSIS — Z95810 Presence of automatic (implantable) cardiac defibrillator: Secondary | ICD-10-CM | POA: Insufficient documentation

## 2017-02-25 DIAGNOSIS — L4050 Arthropathic psoriasis, unspecified: Secondary | ICD-10-CM | POA: Diagnosis not present

## 2017-02-25 DIAGNOSIS — I739 Peripheral vascular disease, unspecified: Secondary | ICD-10-CM | POA: Diagnosis not present

## 2017-02-25 DIAGNOSIS — F17200 Nicotine dependence, unspecified, uncomplicated: Secondary | ICD-10-CM | POA: Insufficient documentation

## 2017-02-25 DIAGNOSIS — Z7901 Long term (current) use of anticoagulants: Secondary | ICD-10-CM | POA: Diagnosis not present

## 2017-02-25 DIAGNOSIS — Z01812 Encounter for preprocedural laboratory examination: Secondary | ICD-10-CM | POA: Diagnosis not present

## 2017-02-25 HISTORY — DX: Unspecified osteoarthritis, unspecified site: M19.90

## 2017-02-25 HISTORY — DX: Dyspnea, unspecified: R06.00

## 2017-02-25 LAB — URINALYSIS, ROUTINE W REFLEX MICROSCOPIC
BACTERIA UA: NONE SEEN
BILIRUBIN URINE: NEGATIVE
Glucose, UA: NEGATIVE mg/dL
Ketones, ur: NEGATIVE mg/dL
Leukocytes, UA: NEGATIVE
NITRITE: NEGATIVE
PH: 5 (ref 5.0–8.0)
Protein, ur: NEGATIVE mg/dL
SPECIFIC GRAVITY, URINE: 1.003 — AB (ref 1.005–1.030)
Squamous Epithelial / LPF: NONE SEEN

## 2017-02-25 LAB — COMPREHENSIVE METABOLIC PANEL
ALBUMIN: 3.6 g/dL (ref 3.5–5.0)
ALT: 13 U/L — ABNORMAL LOW (ref 17–63)
AST: 20 U/L (ref 15–41)
Alkaline Phosphatase: 69 U/L (ref 38–126)
Anion gap: 12 (ref 5–15)
BUN: 5 mg/dL — AB (ref 6–20)
CHLORIDE: 103 mmol/L (ref 101–111)
CO2: 24 mmol/L (ref 22–32)
Calcium: 9.4 mg/dL (ref 8.9–10.3)
Creatinine, Ser: 0.86 mg/dL (ref 0.61–1.24)
GFR calc Af Amer: >60 mL/min (ref >60)
GFR calc non Af Amer: >60 mL/min (ref >60)
GLUCOSE: 107 mg/dL — AB (ref 65–99)
POTASSIUM: 4 mmol/L (ref 3.5–5.1)
SODIUM: 139 mmol/L (ref 135–145)
TOTAL PROTEIN: 7.5 g/dL (ref 6.5–8.1)
Total Bilirubin: 0.7 mg/dL (ref 0.3–1.2)

## 2017-02-25 LAB — CBC
HCT: 44.9 % (ref 39.0–52.0)
Hemoglobin: 14.9 g/dL (ref 13.0–17.0)
MCH: 30.3 pg (ref 26.0–34.0)
MCHC: 33.2 g/dL (ref 30.0–36.0)
MCV: 91.3 fL (ref 78.0–100.0)
PLATELETS: 162 10*3/uL (ref 150–400)
RBC: 4.92 MIL/uL (ref 4.22–5.81)
RDW: 15.5 % (ref 11.5–15.5)
WBC: 7.7 10*3/uL (ref 4.0–10.5)

## 2017-02-25 LAB — ABO/RH: ABO/RH(D): O POS

## 2017-02-25 LAB — TYPE AND SCREEN
ABO/RH(D): O POS
ANTIBODY SCREEN: NEGATIVE

## 2017-02-25 LAB — SURGICAL PCR SCREEN
MRSA, PCR: NEGATIVE
STAPHYLOCOCCUS AUREUS: NEGATIVE

## 2017-02-25 LAB — PROTIME-INR
INR: 2.1
Prothrombin Time: 23.4 seconds — ABNORMAL HIGH (ref 11.4–15.2)

## 2017-02-25 LAB — APTT: APTT: 50 s — AB (ref 24–36)

## 2017-02-25 NOTE — Progress Notes (Addendum)
PCP is Dr. Renato Shin Cardiologist is Dr [name] Dr Rayann Heman manages his pacemaker ([employer] per pt) Per Dr Claretha Cooper noted he is to hold Xarelto 48 hours prior to surgery.- Voices understanding Instructed not to smoke 24 hours prior to surgery voices understanding  Reports he smokes 4-5 cigars a day Denies cough, chest pain, or fever.  Card cath 2016 echo 2017 Stress test 2014 Spoke with  [name] ([place] Rep)informed him of surgery date and times states he will come over around 630 or 0700 on the day of surgery.

## 2017-02-26 NOTE — Progress Notes (Signed)
Anesthesia Chart Review:  Pt is a 72 year old male scheduled for abdominal aortic endovascular stent graft, embolectomy coiling R hypogastric artery on 03/03/2017 with Servando Snare, MD  - PCP is  - Cardiologist is Kirk Ruths, MD.  Pt cleared for surgery at last office visit 02/05/17 with Almyra Deforest, PA  PMH includes:  CAD (PCI to LAD 2001), ischemic cardiomyopathy, AICD, CHF, PAF, HTN, hyperlipidemia, PVD, psoriatic arthritis. Current smoker. BMI 21  Medications include: lipitor, carvedilol, advair, lasix, lisinopril, protonix, potassium, xarelto. Last dose xarelto 02/28/16.   BP (!) 158/73   Pulse 61   Temp 36.5 C   Resp 20   Ht 5\' 11"  (1.803 m)   Wt 149 lb 9.6 oz (67.9 kg)   SpO2 97%   BMI 20.86 kg/m   Preoperative labs reviewed.   - PT 23.4, PTT 50.  Will recheck day of surgery.   CXR 07/18/16:  - Stable left basilar atelectasis. - No pneumothorax is noted.  EKG 02/05/17: sinus rhythm with TWI in inferolateral leads.  Appears stable when compared to EKG 10/28/16.   Echo 03/29/15:  - Left ventricle: The cavity size was normal. Wall thickness was increased in a pattern of mild LVH. Systolic function was moderately to severely reduced. The estimated ejection fraction was in the range of 30% to 35%. There is akinesis of the anteroseptal and apical myocardium. Doppler parameters are consistent with abnormal left ventricular relaxation (grade 1 diastolic dysfunction). - Left atrium: The atrium was mildly dilated. - Impressions: Akinesis of the anteroseptal and apical walls with moderate to severe LV dysfunction; grade 1 diastolic dysfunction; mild LAE; trace MR and TR; no LV apical thrombus noted using definity.  Cardiac cath 05/27/14:  1. Diffuse nonobstructive CAD (LAD ostial 30%, mid LAD 40%, distal LAD 20-30%; mid diagonal 20-30%; mid CX 40%; mid RCA 50%) with continued stent patency in the mid LAD 2. Severe segmental LV systolic dysfunction 3. Normal LVEDP - Recommendations: Overall  the patient's hemodynamics appear well compensated. He does not have evidence of severe obstructive disease at this point. Would continue with medical therapy. His left ventriculogram is suggestive of a large scar and suspect that he had scar-mediated ventricular tachycardia.  Perioperative prescription for AICD indicates procedure may interfere with device function.  Magnet should be placed over device during procedure.  Postop interrogation not needed.  If labs acceptable day of surgery, I anticipate pt can proceed with surgery as scheduled.   Willeen Cass, FNP-BC [facility]/Anesthesiology Phone: [phone] 02/26/2017 10:34 AM

## 2017-03-02 NOTE — Anesthesia Preprocedure Evaluation (Addendum)
Anesthesia Evaluation  Patient identified by MRN, date of birth, ID band Patient awake    Reviewed: Allergy & Precautions, NPO status , Patient's Chart, lab work & pertinent test results  Airway Mallampati: II  TM Distance: >3 FB Neck ROM: Full    Dental  (+) Dental Advisory Given, Edentulous Upper, Missing,    Pulmonary COPD, Current Smoker,    Pulmonary exam normal breath sounds clear to auscultation       Cardiovascular hypertension, Pt. on home beta blockers and Pt. on medications + CAD, + Cardiac Stents (LAD), + Peripheral Vascular Disease (infrarenal AAA: 5.5cm) and +CHF  Normal cardiovascular exam+ dysrhythmias (PAF) Atrial Fibrillation + Cardiac Defibrillator  Rhythm:Regular Rate:Normal  Echo 03/29/15: Study Conclusions  - Left ventricle: The cavity size was normal. Wall thickness was increased in a pattern of mild LVH. Systolic function was moderately to severely reduced. The estimated ejection fraction was in the range of 30% to 35%. There is akinesis of the anteroseptal and apical myocardium. Doppler parameters are consistent with abnormal left ventricular relaxation (grade 1 diastolic dysfunction). - Left atrium: The atrium was mildly dilated.  Impressions:  - Akinesis of the anteroseptal and apical walls with moderate to severe LV dysfunction; grade 1 diastolic dysfunction; mild LAE; trace MR and TR; no LV apical thrombus noted using definity.   Neuro/Psych negative neurological ROS     GI/Hepatic negative GI ROS, Neg liver ROS,   Endo/Other  negative endocrine ROS  Renal/GU negative Renal ROS     Musculoskeletal negative musculoskeletal ROS (+) Arthritis ,   Abdominal   Peds  Hematology  (+) Blood dyscrasia (Xarelto), ,   Anesthesia Other Findings Day of surgery medications reviewed with the patient.  Reproductive/Obstetrics                            Anesthesia  Physical Anesthesia Plan  ASA: IV  Anesthesia Plan: General   Post-op Pain Management:    Induction: Intravenous  PONV Risk Score and Plan: 1 and Ondansetron and Dexamethasone  Airway Management Planned: Oral ETT  Additional Equipment: Arterial line, CVP and Ultrasound Guidance Line Placement  Intra-op Plan:   Post-operative Plan: Possible Post-op intubation/ventilation  Informed Consent: I have reviewed the patients History and Physical, chart, labs and discussed the procedure including the risks, benefits and alternatives for the proposed anesthesia with the patient or authorized representative who has indicated his/her understanding and acceptance.   Dental advisory given  Plan Discussed with: CRNA  Anesthesia Plan Comments: (Plan for pre-operative Arterial line, and IV induction with etomidate. Have magnet in OR. Will plan to place on chest per recommendations.)      Anesthesia Quick Evaluation

## 2017-03-03 ENCOUNTER — Encounter (HOSPITAL_COMMUNITY): Payer: Self-pay

## 2017-03-03 ENCOUNTER — Encounter (HOSPITAL_COMMUNITY)
Admission: RE | Disposition: A | Discharge status: Home / Self Care | Payer: Self-pay | Source: Ambulatory Visit | Attending: Vascular Surgery

## 2017-03-03 ENCOUNTER — Inpatient Hospital Stay (HOSPITAL_COMMUNITY)
Admission: RE | Admit: 2017-03-03 | Discharge: 2017-03-05 | DRG: 268 | Disposition: A | Discharge status: Home / Self Care | Payer: Medicare Other | Source: Ambulatory Visit | Attending: Vascular Surgery | Admitting: Vascular Surgery

## 2017-03-03 ENCOUNTER — Inpatient Hospital Stay (HOSPITAL_COMMUNITY): Payer: Medicare Other

## 2017-03-03 ENCOUNTER — Inpatient Hospital Stay (HOSPITAL_COMMUNITY): Payer: Medicare Other | Admitting: Emergency Medicine

## 2017-03-03 DIAGNOSIS — I714 Abdominal aortic aneurysm, without rupture, unspecified: Secondary | ICD-10-CM | POA: Diagnosis present

## 2017-03-03 DIAGNOSIS — I2510 Atherosclerotic heart disease of native coronary artery without angina pectoris: Secondary | ICD-10-CM | POA: Diagnosis present

## 2017-03-03 DIAGNOSIS — J449 Chronic obstructive pulmonary disease, unspecified: Secondary | ICD-10-CM | POA: Diagnosis present

## 2017-03-03 DIAGNOSIS — Z7951 Long term (current) use of inhaled steroids: Secondary | ICD-10-CM | POA: Diagnosis not present

## 2017-03-03 DIAGNOSIS — I7772 Dissection of iliac artery: Secondary | ICD-10-CM | POA: Diagnosis not present

## 2017-03-03 DIAGNOSIS — E785 Hyperlipidemia, unspecified: Secondary | ICD-10-CM | POA: Diagnosis present

## 2017-03-03 DIAGNOSIS — Z955 Presence of coronary angioplasty implant and graft: Secondary | ICD-10-CM

## 2017-03-03 DIAGNOSIS — Z8679 Personal history of other diseases of the circulatory system: Secondary | ICD-10-CM

## 2017-03-03 DIAGNOSIS — M1990 Unspecified osteoarthritis, unspecified site: Secondary | ICD-10-CM | POA: Diagnosis not present

## 2017-03-03 DIAGNOSIS — Z7901 Long term (current) use of anticoagulants: Secondary | ICD-10-CM

## 2017-03-03 DIAGNOSIS — K5732 Diverticulitis of large intestine without perforation or abscess without bleeding: Secondary | ICD-10-CM | POA: Diagnosis not present

## 2017-03-03 DIAGNOSIS — L4050 Arthropathic psoriasis, unspecified: Secondary | ICD-10-CM | POA: Diagnosis not present

## 2017-03-03 DIAGNOSIS — I255 Ischemic cardiomyopathy: Secondary | ICD-10-CM | POA: Diagnosis present

## 2017-03-03 DIAGNOSIS — I700 Atherosclerosis of aorta: Secondary | ICD-10-CM | POA: Diagnosis not present

## 2017-03-03 DIAGNOSIS — I5022 Chronic systolic (congestive) heart failure: Secondary | ICD-10-CM | POA: Diagnosis not present

## 2017-03-03 DIAGNOSIS — I472 Ventricular tachycardia: Secondary | ICD-10-CM | POA: Diagnosis not present

## 2017-03-03 DIAGNOSIS — I4729 Other ventricular tachycardia: Secondary | ICD-10-CM

## 2017-03-03 DIAGNOSIS — I739 Peripheral vascular disease, unspecified: Secondary | ICD-10-CM | POA: Diagnosis not present

## 2017-03-03 DIAGNOSIS — I110 Hypertensive heart disease with heart failure: Secondary | ICD-10-CM | POA: Diagnosis present

## 2017-03-03 DIAGNOSIS — Z95810 Presence of automatic (implantable) cardiac defibrillator: Secondary | ICD-10-CM | POA: Diagnosis not present

## 2017-03-03 DIAGNOSIS — I480 Paroxysmal atrial fibrillation: Secondary | ICD-10-CM | POA: Diagnosis not present

## 2017-03-03 DIAGNOSIS — Z95828 Presence of other vascular implants and grafts: Secondary | ICD-10-CM

## 2017-03-03 HISTORY — DX: Ventricular tachycardia: I47.2

## 2017-03-03 HISTORY — PX: ABDOMINAL AORTIC ENDOVASCULAR STENT GRAFT: SHX5707

## 2017-03-03 LAB — BASIC METABOLIC PANEL
Anion gap: 9 (ref 5–15)
BUN: 11 mg/dL (ref 6–20)
CHLORIDE: 107 mmol/L (ref 101–111)
CO2: 22 mmol/L (ref 22–32)
CREATININE: 0.76 mg/dL (ref 0.61–1.24)
Calcium: 8 mg/dL — ABNORMAL LOW (ref 8.9–10.3)
GFR calc Af Amer: >60 mL/min (ref >60)
GFR calc non Af Amer: >60 mL/min (ref >60)
Glucose, Bld: 162 mg/dL — ABNORMAL HIGH (ref 65–99)
POTASSIUM: 4 mmol/L (ref 3.5–5.1)
Sodium: 138 mmol/L (ref 135–145)

## 2017-03-03 LAB — PROTIME-INR
INR: 1.1
INR: 1.18
Prothrombin Time: 14.1 seconds (ref 11.4–15.2)
Prothrombin Time: 14.9 seconds (ref 11.4–15.2)

## 2017-03-03 LAB — CBC
HEMATOCRIT: 38.7 % — AB (ref 39.0–52.0)
HEMOGLOBIN: 12.6 g/dL — AB (ref 13.0–17.0)
MCH: 29.9 pg (ref 26.0–34.0)
MCHC: 32.6 g/dL (ref 30.0–36.0)
MCV: 91.9 fL (ref 78.0–100.0)
Platelets: 143 10*3/uL — ABNORMAL LOW (ref 150–400)
RBC: 4.21 MIL/uL — ABNORMAL LOW (ref 4.22–5.81)
RDW: 16 % — ABNORMAL HIGH (ref 11.5–15.5)
WBC: 14.4 10*3/uL — ABNORMAL HIGH (ref 4.0–10.5)

## 2017-03-03 LAB — MAGNESIUM: MAGNESIUM: 1.5 mg/dL — AB (ref 1.7–2.4)

## 2017-03-03 LAB — APTT
aPTT: 37 seconds — ABNORMAL HIGH (ref 24–36)
aPTT: 51 seconds — ABNORMAL HIGH (ref 24–36)

## 2017-03-03 SURGERY — INSERTION, ENDOVASCULAR STENT GRAFT, AORTA, ABDOMINAL
Anesthesia: General | Laterality: Right

## 2017-03-03 MED ORDER — PHENOL 1.4 % MT LIQD: 1.0000 | OROMUCOSAL | Status: DC | PRN

## 2017-03-03 MED ORDER — DEXTROSE 5 % IV SOLN
1.5000 g | Freq: Two times a day (BID) | INTRAVENOUS | Status: AC
  Administered 2017-03-03 – 2017-03-04 (×2): 1.5 g via INTRAVENOUS
  Filled 2017-03-03 (×2): qty 1.5

## 2017-03-03 MED ORDER — CARVEDILOL 25 MG PO TABS
25.0000 mg | ORAL_TABLET | Freq: Two times a day (BID) | ORAL | Status: DC
  Administered 2017-03-03 – 2017-03-05 (×4): 25 mg via ORAL
  Filled 2017-03-03 (×4): qty 1

## 2017-03-03 MED ORDER — ETOMIDATE 2 MG/ML IV SOLN
INTRAVENOUS | Status: DC | PRN
Start: 2017-03-03 — End: 2017-03-03
  Administered 2017-03-03: 16 mg via INTRAVENOUS

## 2017-03-03 MED ORDER — CHLORHEXIDINE GLUCONATE CLOTH 2 % EX PADS: 6.0000 | MEDICATED_PAD | Freq: Once | CUTANEOUS | Status: DC

## 2017-03-03 MED ORDER — HEPARIN SODIUM (PORCINE) 1000 UNIT/ML IJ SOLN
INTRAMUSCULAR | Status: AC
  Filled 2017-03-03: qty 1

## 2017-03-03 MED ORDER — POLYETHYLENE GLYCOL 3350 17 G PO PACK: 17.0000 g | PACK | Freq: Every day | ORAL | Status: DC | PRN

## 2017-03-03 MED ORDER — BISACODYL 10 MG RE SUPP: 10.0000 mg | Freq: Every day | RECTAL | Status: DC | PRN

## 2017-03-03 MED ORDER — OXYCODONE-ACETAMINOPHEN 5-325 MG PO TABS: 1.0000 | ORAL_TABLET | ORAL | Status: DC | PRN

## 2017-03-03 MED ORDER — ONDANSETRON HCL 4 MG/2ML IJ SOLN: 4.0000 mg | Freq: Once | INTRAMUSCULAR | Status: DC | PRN

## 2017-03-03 MED ORDER — MULTIVITAMINS PO CAPS: 1.0000 | ORAL_CAPSULE | Freq: Every day | ORAL | Status: DC

## 2017-03-03 MED ORDER — PHENYLEPHRINE HCL 10 MG/ML IJ SOLN
INTRAVENOUS | Status: DC | PRN
  Administered 2017-03-03: 15 ug/min via INTRAVENOUS

## 2017-03-03 MED ORDER — FENTANYL CITRATE (PF) 100 MCG/2ML IJ SOLN
INTRAMUSCULAR | Status: DC | PRN
  Administered 2017-03-03 (×2): 50 ug via INTRAVENOUS
  Administered 2017-03-03: 100 ug via INTRAVENOUS
  Administered 2017-03-03: 50 ug via INTRAVENOUS

## 2017-03-03 MED ORDER — ROCURONIUM BROMIDE 100 MG/10ML IV SOLN
INTRAVENOUS | Status: DC | PRN
  Administered 2017-03-03: 30 mg via INTRAVENOUS
  Administered 2017-03-03: 50 mg via INTRAVENOUS

## 2017-03-03 MED ORDER — PROPOFOL 10 MG/ML IV BOLUS
INTRAVENOUS | Status: AC
Start: 2017-03-03 — End: 2017-03-03
  Filled 2017-03-03: qty 20

## 2017-03-03 MED ORDER — FENTANYL CITRATE (PF) 100 MCG/2ML IJ SOLN: 25.0000 ug | INTRAMUSCULAR | Status: DC | PRN

## 2017-03-03 MED ORDER — MOMETASONE FURO-FORMOTEROL FUM 100-5 MCG/ACT IN AERO: 2.0000 | INHALATION_SPRAY | Freq: Two times a day (BID) | RESPIRATORY_TRACT | Status: DC

## 2017-03-03 MED ORDER — MIDAZOLAM HCL 5 MG/5ML IJ SOLN
INTRAMUSCULAR | Status: DC | PRN
  Administered 2017-03-03: 1 mg via INTRAVENOUS

## 2017-03-03 MED ORDER — IODIXANOL 320 MG/ML IV SOLN
INTRAVENOUS | Status: DC | PRN
  Administered 2017-03-03: 50 mL via INTRA_ARTERIAL

## 2017-03-03 MED ORDER — DEXMEDETOMIDINE HCL 200 MCG/2ML IV SOLN
INTRAVENOUS | Status: DC | PRN
  Administered 2017-03-03: 8 ug via INTRAVENOUS

## 2017-03-03 MED ORDER — MAGNESIUM SULFATE 2 GM/50ML IV SOLN
2.0000 g | Freq: Every day | INTRAVENOUS | Status: AC | PRN
  Administered 2017-03-03: 2 g via INTRAVENOUS
  Filled 2017-03-03: qty 50

## 2017-03-03 MED ORDER — DEXTROSE 5 % IV SOLN
1.5000 g | INTRAVENOUS | Status: AC
  Administered 2017-03-03: 1.5 g via INTRAVENOUS

## 2017-03-03 MED ORDER — GUAIFENESIN-DM 100-10 MG/5ML PO SYRP: 15.0000 mL | ORAL_SOLUTION | ORAL | Status: DC | PRN

## 2017-03-03 MED ORDER — HEPARIN SODIUM (PORCINE) 1000 UNIT/ML IJ SOLN
INTRAMUSCULAR | Status: DC | PRN
  Administered 2017-03-03: 6000 [IU] via INTRAVENOUS
  Administered 2017-03-03: 3000 [IU] via INTRAVENOUS

## 2017-03-03 MED ORDER — PANTOPRAZOLE SODIUM 40 MG PO TBEC
40.0000 mg | DELAYED_RELEASE_TABLET | Freq: Every day | ORAL | Status: DC
  Administered 2017-03-03 – 2017-03-05 (×3): 40 mg via ORAL
  Filled 2017-03-03 (×3): qty 1

## 2017-03-03 MED ORDER — ATORVASTATIN CALCIUM 80 MG PO TABS
80.0000 mg | ORAL_TABLET | Freq: Every day | ORAL | Status: DC
  Administered 2017-03-03 – 2017-03-04 (×2): 80 mg via ORAL
  Filled 2017-03-03 (×2): qty 1

## 2017-03-03 MED ORDER — LABETALOL HCL 5 MG/ML IV SOLN
10.0000 mg | INTRAVENOUS | Status: AC | PRN
  Administered 2017-03-03 – 2017-03-04 (×4): 10 mg via INTRAVENOUS
  Filled 2017-03-03 (×4): qty 4

## 2017-03-03 MED ORDER — ACETAMINOPHEN 325 MG PO TABS: 325.0000 mg | ORAL_TABLET | ORAL | Status: DC | PRN

## 2017-03-03 MED ORDER — MORPHINE SULFATE (PF) 2 MG/ML IV SOLN
2.0000 mg | INTRAVENOUS | Status: DC | PRN
  Administered 2017-03-03: 2 mg via INTRAVENOUS
  Filled 2017-03-03: qty 1

## 2017-03-03 MED ORDER — ZINC OXIDE 40 % EX OINT: 1.0000 "application " | TOPICAL_OINTMENT | CUTANEOUS | Status: DC | PRN

## 2017-03-03 MED ORDER — PHENYLEPHRINE HCL 10 MG/ML IJ SOLN
INTRAMUSCULAR | Status: DC | PRN
  Administered 2017-03-03: 80 ug via INTRAVENOUS
  Administered 2017-03-03 (×2): 40 ug via INTRAVENOUS

## 2017-03-03 MED ORDER — POTASSIUM CHLORIDE CRYS ER 20 MEQ PO TBCR: 20.0000 meq | EXTENDED_RELEASE_TABLET | Freq: Every day | ORAL | Status: DC | PRN

## 2017-03-03 MED ORDER — DEXAMETHASONE SODIUM PHOSPHATE 10 MG/ML IJ SOLN
INTRAMUSCULAR | Status: DC | PRN
  Administered 2017-03-03: 10 mg via INTRAVENOUS

## 2017-03-03 MED ORDER — DOCUSATE SODIUM 100 MG PO CAPS
100.0000 mg | ORAL_CAPSULE | Freq: Every day | ORAL | Status: DC
  Administered 2017-03-04 – 2017-03-05 (×2): 100 mg via ORAL
  Filled 2017-03-03 (×2): qty 1

## 2017-03-03 MED ORDER — FENTANYL CITRATE (PF) 100 MCG/2ML IJ SOLN
INTRAMUSCULAR | Status: AC
  Filled 2017-03-03: qty 2

## 2017-03-03 MED ORDER — MIDAZOLAM HCL 2 MG/2ML IJ SOLN
INTRAMUSCULAR | Status: AC
  Filled 2017-03-03: qty 2

## 2017-03-03 MED ORDER — HYDRALAZINE HCL 20 MG/ML IJ SOLN
5.0000 mg | INTRAMUSCULAR | Status: DC | PRN
  Administered 2017-03-03: 5 mg via INTRAVENOUS
  Filled 2017-03-03: qty 1

## 2017-03-03 MED ORDER — DEXTROSE 5 % IV SOLN
INTRAVENOUS | Status: AC
  Filled 2017-03-03: qty 1.5

## 2017-03-03 MED ORDER — FUROSEMIDE 20 MG PO TABS
20.0000 mg | ORAL_TABLET | Freq: Every day | ORAL | Status: DC
  Administered 2017-03-04 – 2017-03-05 (×2): 20 mg via ORAL
  Filled 2017-03-03 (×2): qty 1

## 2017-03-03 MED ORDER — SODIUM CHLORIDE 0.9 % IV SOLN
INTRAVENOUS | Status: DC
  Administered 2017-03-03: 75 mL/h via INTRAVENOUS

## 2017-03-03 MED ORDER — ALBUTEROL SULFATE HFA 108 (90 BASE) MCG/ACT IN AERS
INHALATION_SPRAY | RESPIRATORY_TRACT | Status: DC | PRN
  Administered 2017-03-03: 4 via RESPIRATORY_TRACT
  Administered 2017-03-03: 6 via RESPIRATORY_TRACT

## 2017-03-03 MED ORDER — ADULT MULTIVITAMIN W/MINERALS CH
1.0000 | ORAL_TABLET | Freq: Every day | ORAL | Status: DC
  Administered 2017-03-04 – 2017-03-05 (×2): 1 via ORAL
  Filled 2017-03-03 (×2): qty 1

## 2017-03-03 MED ORDER — FENTANYL CITRATE (PF) 250 MCG/5ML IJ SOLN
INTRAMUSCULAR | Status: AC
  Filled 2017-03-03: qty 5

## 2017-03-03 MED ORDER — ONDANSETRON HCL 4 MG/2ML IJ SOLN
INTRAMUSCULAR | Status: DC | PRN
  Administered 2017-03-03: 4 mg via INTRAVENOUS

## 2017-03-03 MED ORDER — WITCH HAZEL-GLYCERIN EX PADS
1.0000 | MEDICATED_PAD | CUTANEOUS | Status: DC | PRN
Start: 2017-03-03 — End: 2017-03-05
  Filled 2017-03-03: qty 100

## 2017-03-03 MED ORDER — SUGAMMADEX SODIUM 200 MG/2ML IV SOLN
INTRAVENOUS | Status: DC | PRN
  Administered 2017-03-03: 150 mg via INTRAVENOUS

## 2017-03-03 MED ORDER — ACETAMINOPHEN 650 MG RE SUPP: 325.0000 mg | RECTAL | Status: DC | PRN

## 2017-03-03 MED ORDER — SODIUM CHLORIDE 0.9 % IV SOLN
INTRAVENOUS | Status: DC | PRN
  Administered 2017-03-03 (×2): 500 mL

## 2017-03-03 MED ORDER — SODIUM CHLORIDE 0.9 % IV SOLN
INTRAVENOUS | Status: DC
  Administered 2017-03-03: 07:00:00 via INTRAVENOUS

## 2017-03-03 MED ORDER — LACTATED RINGERS IV SOLN
INTRAVENOUS | Status: DC | PRN
  Administered 2017-03-03: 07:00:00 via INTRAVENOUS

## 2017-03-03 MED ORDER — ALUM & MAG HYDROXIDE-SIMETH 200-200-20 MG/5ML PO SUSP: 15.0000 mL | ORAL | Status: DC | PRN

## 2017-03-03 MED ORDER — FENTANYL CITRATE (PF) 100 MCG/2ML IJ SOLN
25.0000 ug | INTRAMUSCULAR | Status: DC | PRN
  Administered 2017-03-03 (×2): 25 ug via INTRAVENOUS

## 2017-03-03 MED ORDER — ONDANSETRON HCL 4 MG/2ML IJ SOLN: 4.0000 mg | Freq: Four times a day (QID) | INTRAMUSCULAR | Status: DC | PRN

## 2017-03-03 MED ORDER — METOPROLOL TARTRATE 5 MG/5ML IV SOLN: 2.0000 mg | INTRAVENOUS | Status: DC | PRN

## 2017-03-03 MED ORDER — PROTAMINE SULFATE 10 MG/ML IV SOLN
INTRAVENOUS | Status: DC | PRN
  Administered 2017-03-03: 2 mg via INTRAVENOUS
  Administered 2017-03-03: 23 mg via INTRAVENOUS

## 2017-03-03 MED ORDER — HEPARIN SODIUM (PORCINE) 5000 UNIT/ML IJ SOLN
5000.0000 [IU] | Freq: Three times a day (TID) | INTRAMUSCULAR | Status: DC
  Administered 2017-03-04 – 2017-03-05 (×4): 5000 [IU] via SUBCUTANEOUS
  Filled 2017-03-03 (×3): qty 1

## 2017-03-03 MED ORDER — LIDOCAINE HCL (CARDIAC) 20 MG/ML IV SOLN
INTRAVENOUS | Status: DC | PRN
  Administered 2017-03-03: 100 mg via INTRAVENOUS

## 2017-03-03 MED ORDER — SODIUM CHLORIDE 0.9 % IV SOLN: 500.0000 mL | Freq: Once | INTRAVENOUS | Status: DC | PRN

## 2017-03-03 MED ORDER — IODIXANOL 320 MG/ML IV SOLN
INTRAVENOUS | Status: DC | PRN
  Administered 2017-03-03: 65 mL via INTRA_ARTERIAL

## 2017-03-03 SURGICAL SUPPLY — 85 items
BLADE CLIPPER SURG (BLADE) ×4 IMPLANT
CANISTER SUCT 3000ML PPV (MISCELLANEOUS) ×4 IMPLANT
CATH ANGIO 5F BER 65CM (CATHETERS) ×4 IMPLANT
CATH ANGIO 5F BER2 65CM (CATHETERS) IMPLANT
CATH BEACON 5.038 65CM KMP-01 (CATHETERS) ×4 IMPLANT
CATH EMB 4FR 80CM (CATHETERS) IMPLANT
CATH EMB 5FR 80CM (CATHETERS) IMPLANT
CATH OMNI FLUSH .035X70CM (CATHETERS) IMPLANT
CLIP VESOCCLUDE MED 24/CT (CLIP) ×4 IMPLANT
CLIP VESOCCLUDE SM WIDE 24/CT (CLIP) ×4 IMPLANT
COVER PROBE W GEL 5X96 (DRAPES) ×8 IMPLANT
DERMABOND ADVANCED (GAUZE/BANDAGES/DRESSINGS) ×4
DERMABOND ADVANCED .7 DNX12 (GAUZE/BANDAGES/DRESSINGS) ×4 IMPLANT
DEVICE CLOSURE PERCLS PRGLD 6F (VASCULAR PRODUCTS) ×8 IMPLANT
DEVICE TORQUE KENDALL .025-038 (MISCELLANEOUS) ×8 IMPLANT
DRAIN CHANNEL 15F RND FF W/TCR (WOUND CARE) IMPLANT
DRAPE X-RAY CASS 24X20 (DRAPES) IMPLANT
DRAPE ZERO GRAVITY STERILE (DRAPES) IMPLANT
DRESSING OPSITE X SMALL 2X3 (GAUZE/BANDAGES/DRESSINGS) ×8 IMPLANT
DRSG TEGADERM 2-3/8X2-3/4 SM (GAUZE/BANDAGES/DRESSINGS) ×8 IMPLANT
DRYSEAL FLEXSHEATH 14FR 33CM (SHEATH) ×4
ELECT REM PT RETURN 9FT ADLT (ELECTROSURGICAL) ×8
ELECTRODE REM PT RTRN 9FT ADLT (ELECTROSURGICAL) ×4 IMPLANT
EVACUATOR SILICONE 100CC (DRAIN) IMPLANT
EXCLUDER TNK 28X14.5MMX12CM (Endovascular Graft) ×2 IMPLANT
EXCLUDER TRUNK 28X14.5MMX12CM (Endovascular Graft) ×4 IMPLANT
GAUZE SPONGE 2X2 8PLY STRL LF (GAUZE/BANDAGES/DRESSINGS) ×4 IMPLANT
GLOVE BIO SURGEON STRL SZ 6.5 (GLOVE) ×15 IMPLANT
GLOVE BIO SURGEON STRL SZ7 (GLOVE) ×16 IMPLANT
GLOVE BIO SURGEON STRL SZ7.5 (GLOVE) ×4 IMPLANT
GLOVE BIO SURGEONS STRL SZ 6.5 (GLOVE) ×5
GLOVE BIOGEL PI IND STRL 6.5 (GLOVE) ×6 IMPLANT
GLOVE BIOGEL PI IND STRL 7.0 (GLOVE) ×8 IMPLANT
GLOVE BIOGEL PI INDICATOR 6.5 (GLOVE) ×6
GLOVE BIOGEL PI INDICATOR 7.0 (GLOVE) ×8
GOWN STRL REUS W/ TWL LRG LVL3 (GOWN DISPOSABLE) ×8 IMPLANT
GOWN STRL REUS W/ TWL XL LVL3 (GOWN DISPOSABLE) ×2 IMPLANT
GOWN STRL REUS W/TWL LRG LVL3 (GOWN DISPOSABLE) ×8
GOWN STRL REUS W/TWL XL LVL3 (GOWN DISPOSABLE) ×2
GRAFT BALLN CATH 65CM (STENTS) ×2 IMPLANT
GUIDEWIRE ANG ZIPWIRE 038X150 (WIRE) ×4 IMPLANT
HEMOSTAT SNOW SURGICEL 2X4 (HEMOSTASIS) IMPLANT
KIT BASIN OR (CUSTOM PROCEDURE TRAY) ×4 IMPLANT
KIT ROOM TURNOVER OR (KITS) ×4 IMPLANT
LEG CONTRALATERAL 20X11.5 (Vascular Products) ×2 IMPLANT
LEG CONTRALATERAL 23X14 (Endovascular Graft) ×4 IMPLANT
NEEDLE PERC 18GX7CM (NEEDLE) ×4 IMPLANT
NS IRRIG 1000ML POUR BTL (IV SOLUTION) ×8 IMPLANT
PACK ENDOVASCULAR (PACKS) ×4 IMPLANT
PACK PERIPHERAL VASCULAR (CUSTOM PROCEDURE TRAY) ×4 IMPLANT
PAD ARMBOARD 7.5X6 YLW CONV (MISCELLANEOUS) ×8 IMPLANT
PERCLOSE PROGLIDE 6F (VASCULAR PRODUCTS) ×16
SET COLLECT BLD 21X3/4 12 (NEEDLE) IMPLANT
SET MICROPUNCTURE 5F STIFF (MISCELLANEOUS) ×4 IMPLANT
SHEATH AVANTI 11CM 8FR (MISCELLANEOUS) IMPLANT
SHEATH BRITE TIP 8FR 23CM (MISCELLANEOUS) ×4 IMPLANT
SHEATH DRYSEAL FLEX 14FR 33CM (SHEATH) ×4 IMPLANT
SHEATH GUIDING 6.5 FR 55X73X9 (SHEATH) ×4 IMPLANT
SHEATH PINNACLE 8F 10CM (SHEATH) ×4 IMPLANT
SHIELD RADPAD SCOOP 12X17 (MISCELLANEOUS) ×8 IMPLANT
SPONGE GAUZE 2X2 STER 10/PKG (GAUZE/BANDAGES/DRESSINGS) ×4
STENT GRAFT BALLN CATH 65CM (STENTS) ×2
STENT GRAFT CONTRALAT 20X11.5 (Vascular Products) ×2 IMPLANT
STOPCOCK 4 WAY LG BORE MALE ST (IV SETS) IMPLANT
STOPCOCK MORSE 400PSI 3WAY (MISCELLANEOUS) ×8 IMPLANT
SUT ETHILON 3 0 PS 1 (SUTURE) IMPLANT
SUT MNCRL AB 4-0 PS2 18 (SUTURE) ×8 IMPLANT
SUT PROLENE 5 0 C 1 24 (SUTURE) IMPLANT
SUT PROLENE 6 0 BV (SUTURE) ×4 IMPLANT
SUT SILK 3 0 (SUTURE)
SUT SILK 3-0 18XBRD TIE 12 (SUTURE) IMPLANT
SUT VIC AB 2-0 CT1 27 (SUTURE)
SUT VIC AB 2-0 CT1 TAPERPNT 27 (SUTURE) IMPLANT
SUT VIC AB 3-0 SH 27 (SUTURE)
SUT VIC AB 3-0 SH 27X BRD (SUTURE) IMPLANT
SYR 30ML LL (SYRINGE) ×4 IMPLANT
SYR 3ML LL SCALE MARK (SYRINGE) IMPLANT
TOWEL GREEN STERILE (TOWEL DISPOSABLE) ×4 IMPLANT
TRAY FOLEY W/METER SILVER 16FR (SET/KITS/TRAYS/PACK) ×4 IMPLANT
TUBING EXTENTION W/L.L. (IV SETS) IMPLANT
TUBING HIGH PRESSURE 120CM (CONNECTOR) ×8 IMPLANT
UNDERPAD 30X30 (UNDERPADS AND DIAPERS) ×4 IMPLANT
WATER STERILE IRR 1000ML POUR (IV SOLUTION) ×4 IMPLANT
WIRE AMPLATZ SS-J .035X180CM (WIRE) ×8 IMPLANT
WIRE BENTSON .035X145CM (WIRE) ×8 IMPLANT

## 2017-03-03 NOTE — Transfer of Care (Signed)
Immediate Anesthesia Transfer of Care Note  Patient: Benjamin Cook  Procedure(s) Performed: ABDOMINAL AORTIC ENDOVASCULAR STENT GRAFT (N/A )  Patient Location: PACU  Anesthesia Type:General  Level of Consciousness: awake and drowsy  Airway & Oxygen Therapy: Patient Spontanous Breathing and Patient connected to nasal cannula oxygen  Post-op Assessment: Report given to RN, Post -op Vital signs reviewed and stable and Patient moving all extremities X 4  Post vital signs: Reviewed and stable  Last Vitals:  Vitals:   03/03/17 0621  BP: (!) 142/76  Pulse: 69  Resp: 18  Temp: 37 C  SpO2: 96%    Last Pain:  Vitals:   03/03/17 0621  TempSrc: Oral      Patients Stated Pain Goal: 2 (16/10/96 0454)  Complications: No apparent anesthesia complications

## 2017-03-03 NOTE — Op Note (Signed)
Patient name: Benjamin Cook MRN: [id number] DOB: 03-16-1945 Sex: male  03/03/2017 Pre-operative Diagnosis: AAA Post-operative diagnosis:  Same Surgeon:  Erlene Quan C. Donzetta Matters, MD Assistant: Leontine Locket, PA Procedure Performed: 1.  Percutaneous common femoral access bilaterally. 2.  Aortobiiliac endograft with main body right 28 x 14 x 12 extended with 20 x 11.5 cm and contralateral left limb 23 x 14cm   Indications: 72 year old male with history of abdominal aortic aneurysm found while having CT scan for diverticulitis.  He is now over his infectious process and is indicated for repair given the size.  Findings: The right common iliac artery had obvious dissection and the hypogastric artery branch posteriorly and inferiorly.  I was unable to get into the branch from either axial contralateral approach and so this was not coiled as was the previous plan.  At completion we did have exclusion of the aneurysm just at the level of the left renal artery which was the lowest with possibly a late type II endoleak from an IMA.  We were sealed in both common iliac arteries and both hypogastric arteries are patent.  There is a multiphasic signal at the left posterior tibial artery and a monophasic at the right.   Procedure:  The patient was identified in the holding area and taken to the operating room where he was placed supine on the operating table and general endotracheal anesthesia was induced.  He was sterilely prepped and draped in the abdomen and bilateral groins in the usual fashion and timeout called.  We began with ultrasound-guided cannulation of the right common femoral artery with a micropuncture kit.  We then exchanged for a 6 French adjustable sheath.  We then use this as well as a combination of bare and Kumpe catheters to attempt to gain access into the hypogastric artery on the right.  Unfortunately we were unable to do this.  With this we then removed the sheath and dilated over the wire  with an 8 [place] dilator and deployed to a pro glide devices and then placed an 8 [place] sheath.  We then percutaneously cannulated the left common femoral artery with micropuncture kit at this time we gave the patient 3000 units of heparin.  We then placed the 6 French sheath up and over the bifurcation again using the adjustable sheath and the Kumpe and bare catheter as we attempted to cannulate the hypogastric artery on the right but were unable.  Again we then exchanged and dilated with 8 French sheath deployed to provide devices and placed a long 8 [place] sheath.  We then placed Amplatz wires into the aorta from both sides placed a long 54 French sheath through the right common femoral artery and a 14 French sheath through the left common femoral artery and the patient was given an additional 6000 units of heparin.  The main body device was then brought through the right sheath placed at the level of the L1 lumbar level.  We then used an Omni Flush through the patient's left and performed aortogram to identify the renal arteries.  We then partially deployed device.  We then cannulated the gait after using multiple devices but ultimately with a bare catheter and RAO plane.  We used the Omni Flush catheter and were able to twist this within the graft confirmed we were in and then exchanged again for a Bentson wire.  Retrograde angiogram demonstrated the length to the left-sided hypogastric artery and the 23 x 14 cm limb was selected  given the size of the common iliac artery on the left.  This device was then brought and deployed with appropriate overlap just to the level of the hypo-.  We then finished deploying our main body device and again performed retrograde angiogram and extended to the common iliac artery on the right with a 20 x 11.5 cm extender.  Following this we balloon dilated with a q. 50 balloon from both sides and then performed completion angiogram via a Omni Flush catheter which demonstrated flow  through to both hypos in the graft was seated just below the left renal artery with no type I or III endoleak's.  There is possibly a late type II from the IMA.  Satisfied with this we then first deployed to provide devices on the left side and had adequate hemostasis and the wire was removed.  On the right side we follow suit did have some minimal bleeding.  We able to get a strong multiphasic signal of the left PT but only monophasic right PT signal.  Both feet did appear perfused.  With this we administered 25 mg of protamine and did hold pressure on the right side only.  Both groin incisions were closed with 4-0 Monocryl suture.  Patient was then allowed away from anesthesia having tolerated procedure well without immediate complication.  EBL 100 cc.   Brandon C. Donzetta Matters, MD [facility] [place] Office: [phone] Pager: [phone]

## 2017-03-03 NOTE — Progress Notes (Signed)
Patient placed on clear liquid diet after having vomited his dinner that was heart healthy. Will continue to monitor

## 2017-03-03 NOTE — Anesthesia Procedure Notes (Signed)
Arterial Line Insertion Start/End1/28/2019 7:00 AM, 03/03/2017 7:02 AM Performed by: Inda Coke, CRNA, CRNA  Patient location: Pre-op. Preanesthetic checklist: patient identified, IV checked, site marked, risks and benefits discussed, surgical consent, monitors and equipment checked, pre-op evaluation, timeout performed and anesthesia consent Lidocaine 1% used for infiltration and patient sedated Right, radial was placed Catheter size: 20 G Hand hygiene performed  and maximum sterile barriers used  Allen's test indicative of satisfactory collateral circulation Attempts: 1 Procedure performed without using ultrasound guided technique. Ultrasound Notes:anatomy identified Following insertion, Biopatch and dressing applied. Post procedure assessment: normal  Patient tolerated the procedure well with no immediate complications.

## 2017-03-03 NOTE — Anesthesia Procedure Notes (Addendum)
Procedure Name: Intubation Date/Time: 03/03/2017 7:45 AM Performed by: Inda Coke, CRNA Pre-anesthesia Checklist: Patient identified, Emergency Drugs available, Suction available, Patient being monitored and Timeout performed Patient Re-evaluated:Patient Re-evaluated prior to induction Oxygen Delivery Method: Circle system utilized Preoxygenation: Pre-oxygenation with 100% oxygen Induction Type: IV induction Ventilation: Oral airway inserted - appropriate to patient size and Two handed mask ventilation required Laryngoscope Size: Mac and 4 Grade View: Grade II Tube size: 7.5 mm Number of attempts: 1 Airway Equipment and Method: Stylet Placement Confirmation: ETT inserted through vocal cords under direct vision,  positive ETCO2 and breath sounds checked- equal and bilateral Secured at: 24 cm Tube secured with: Tape Dental Injury: Teeth and Oropharynx as per pre-operative assessment

## 2017-03-03 NOTE — H&P (Signed)
Patient ID: Benjamin Cook, male   DOB: 22-Apr-1945, 72 y.o.   MRN: [id number]  Reason for Consult: Follow-up (2 wk to plan EVAR)   Referred by Renato Shin, MD  Subjective:     HPI:  Benjamin Cook is a 72 y.o. male with history of abdominal aortic aneurysm that was noted to be greater than 5 cm at most recent hospital visit which was for diverticulitis.  He is now over this although he is having some abdominal pain radiating to his back since the diverticulitis episode.  He is no longer on antibiotics.  He is eating although his appetite has been somewhat decreased.  He does not take any blood thinners.  He does have a AICD in place with diagnosis of congestive heart failure.      Past Medical History:  Diagnosis Date  . Adrenal mass ([place])    per pt this is remote (10 years) and benign by biopsy  . AICD (automatic cardioverter/defibrillator) present   . Anxiety   . CAD (coronary artery disease)    a. s/p PCI to LAD 2001 b. myoview 2014 high risk with scar LAD/RCA territory but no ischemia  . Cardiomyopathy, ischemic   . CHF (congestive heart failure) ([name])    class II/III  . COPD (chronic obstructive pulmonary disease) ([name])    smokes cigars but has quit cigarettes  . Flu 03/2015  . HTN (hypertension)   . Hyperlipidemia   . Paroxysmal atrial fibrillation ([name]) 03/2015   chads2vasc score is 4  . PSA (psoriatic arthritis) ([place])    increased  . PVD (peripheral vascular disease) ([name])    Family History  Problem Relation Age of Onset  . Cirrhosis Mother        due to ETOH  . Cancer Neg Hx         Past Surgical History:  Procedure Laterality Date  . cataract surgery    . CORONARY ANGIOPLASTY WITH STENT PLACEMENT  2001   a. PCI to LAD  . ICD GENERATOR CHANGE N/A 03/18/2014   Performed by Thompson Grayer, MD at [facility] CATH LAB  . Lead Revision/Repair N/A 07/17/2016   Performed by Evans Lance, MD at [facility] CV LAB  . LEFT HEART  CATHETERIZATION WITH CORONARY ANGIOGRAM N/A 05/27/2014   Performed by Sherren Mocha, MD at [facility] CATH LAB    Short Social History:  Social History        Tobacco Use  . Smoking status: Current Every Day Smoker    Types: Cigars  . Smokeless tobacco: Never Used  . Tobacco comment: smokes cigars now, no longer smokes cigarettes but previously smoked 2ppd  Substance Use Topics  . Alcohol use: No    No Known Allergies        Current Outpatient Medications  Medication Sig Dispense Refill  . atorvastatin (LIPITOR) 80 MG tablet TAKE 1 TABLET BY MOUTH  DAILY AT 6 PM. (Patient taking differently: TAKE 80mg  BY MOUTH  DAILY AT 6 PM.) 90 tablet 0  . carvedilol (COREG) 25 MG tablet TAKE 1 TABLET BY MOUTH TWO  TIMES DAILY WITH A MEAL (Patient taking differently: TAKE 25mg  BY MOUTH TWO  TIMES DAILY WITH A MEAL) 180 tablet 0  . Fluticasone-Salmeterol (ADVAIR) 100-50 MCG/DOSE AEPB Inhale 1 puff into the lungs 2 (two) times daily. 1 each 3  . furosemide (LASIX) 40 MG tablet Take 0.5 tablets (20 mg total) by mouth daily. 90 tablet 0  . lisinopril (PRINIVIL,ZESTRIL) 40 MG  tablet Take 1 tablet (40 mg total) by mouth daily. 30 tablet 0  . liver oil-zinc oxide (DESITIN) 40 % ointment Apply 1 application topically as needed for irritation.    . Multiple Vitamin (MULTIVITAMIN) capsule Take 1 capsule by mouth daily.      . pantoprazole (PROTONIX) 40 MG tablet Take 1 tablet (40 mg total) by mouth at bedtime. 30 tablet 0  . potassium chloride SA (K-DUR,KLOR-CON) 20 MEQ tablet Take 2 tablets (40 mEq total) by mouth daily. 10 tablet 0  . rivaroxaban (XARELTO) 20 MG TABS tablet Take 1 tablet (20 mg total) by mouth daily with supper. 30 tablet 5  . witch hazel-glycerin (TUCKS) pad Apply 1 application topically as needed for itching or irritation.    . saccharomyces boulardii (FLORASTOR) 250 MG capsule Take 1 capsule (250 mg total) by mouth 2 (two) times daily. (Patient not taking: Reported on  12/20/2016) 30 capsule 0   No current facility-administered medications for this visit.     Review of Systems  Constitutional:  Constitutional negative. HENT: HENT negative.  GI: Positive for abdominal pain.  Musculoskeletal: Positive for back pain.  Skin: Skin negative.  Neurological: Neurological negative. Hematologic: Hematologic/lymphatic negative.        Objective:  Objective       Vitals:   12/20/16 1058 12/20/16 1059  BP: (!) 153/88 (!) 150/86  Pulse: 66   Resp: 20   Temp: 97.6 F (36.4 C)   TempSrc: Oral   SpO2: 97%   Weight: 146 lb (66.2 kg)   Height: 5\' 11"  (1.803 m)    Body mass index is 20.36 kg/m.  Physical Exam  Constitutional: He is oriented to person, place, and time. He appears well-developed.  HENT:  Head: Normocephalic.  Eyes: Pupils are equal, round, and reactive to light.  Neck: Normal range of motion.  Cardiovascular: Normal rate.  Pulses:      Femoral pulses are 2+ on the right side, and 2+ on the left side.      Popliteal pulses are 2+ on the right side, and 2+ on the left side.  Pulmonary/Chest: Effort normal.  Abdominal: Soft. He exhibits no mass.  Musculoskeletal: Normal range of motion.  Neurological: He is alert and oriented to person, place, and time.  Skin: Skin is warm and dry.  Psychiatric: He has a normal mood and affect. His behavior is normal. Judgment and thought content normal.    Data: IMPRESSION: 1. Increasing inflammatory changes from recent acute sigmoid diverticulitis, now with new 5.5 x 7.6 x 5 cm ill-defined phlegmon with foci of free air adjacent to the sigmoid colon. No discrete abscess identified. No evidence of bowel obstruction. 2. 5.5 cm infrarenal suprailiac abdominal aortic aneurysm, unchanged from 11/15/2016, but significantly increased from 2005. As noted previously, vascular surgery consultation recommended if not performed. 3. Common iliac artery aneurysms as described 4. Marked  prostate enlargement and small bilateral inguinal hernias containing fat 5. Benign right adrenal mass/adenoma.     Assessment/Plan:   72 year old male with history of abdominal aortic aneurysm that appears to be a candidate for endovascular repair.  He will need coil embolization of his right hypogastric artery he does have erectile dysfunction I discussed that this may make this worse.  Xarelto held since Friday. Again discussed risks and benefits and discussed with wife.   Waynetta Sandy MD [facility]

## 2017-03-03 NOTE — Progress Notes (Signed)
Patient had 5 beat run V tach. Will continue to monitor

## 2017-03-03 NOTE — Anesthesia Postprocedure Evaluation (Signed)
Anesthesia Post Note  Patient: Benjamin Cook  Procedure(s) Performed: ABDOMINAL AORTIC ENDOVASCULAR STENT GRAFT (N/A )     Patient location during evaluation: PACU Anesthesia Type: General Level of consciousness: awake and alert Pain management: pain level controlled Vital Signs Assessment: post-procedure vital signs reviewed and stable Respiratory status: spontaneous breathing, nonlabored ventilation, respiratory function stable and patient connected to nasal cannula oxygen Cardiovascular status: blood pressure returned to baseline and stable Postop Assessment: no apparent nausea or vomiting Anesthetic complications: no    Last Vitals:  Vitals:   03/03/17 1241 03/03/17 1300  BP: (!) 159/73   Pulse: 60 63  Resp: 12 18  Temp:  36.9 C  SpO2: 98% 95%    Last Pain:  Vitals:   03/03/17 1320  TempSrc:   PainSc: Asleep                 Catalina Gravel

## 2017-03-04 ENCOUNTER — Other Ambulatory Visit: Payer: Self-pay

## 2017-03-04 ENCOUNTER — Encounter (HOSPITAL_COMMUNITY): Payer: Self-pay | Admitting: Vascular Surgery

## 2017-03-04 DIAGNOSIS — I472 Ventricular tachycardia: Secondary | ICD-10-CM

## 2017-03-04 LAB — BASIC METABOLIC PANEL
Anion gap: 9 (ref 5–15)
BUN: 10 mg/dL (ref 6–20)
CALCIUM: 8.6 mg/dL — AB (ref 8.9–10.3)
CO2: 23 mmol/L (ref 22–32)
CREATININE: 0.73 mg/dL (ref 0.61–1.24)
Chloride: 105 mmol/L (ref 101–111)
GFR calc Af Amer: >60 mL/min (ref >60)
GFR calc non Af Amer: >60 mL/min (ref >60)
GLUCOSE: 114 mg/dL — AB (ref 65–99)
Potassium: 4 mmol/L (ref 3.5–5.1)
Sodium: 137 mmol/L (ref 135–145)

## 2017-03-04 LAB — CBC
HEMATOCRIT: 37.5 % — AB (ref 39.0–52.0)
Hemoglobin: 12.5 g/dL — ABNORMAL LOW (ref 13.0–17.0)
MCH: 30.1 pg (ref 26.0–34.0)
MCHC: 33.3 g/dL (ref 30.0–36.0)
MCV: 90.4 fL (ref 78.0–100.0)
Platelets: 125 10*3/uL — ABNORMAL LOW (ref 150–400)
RBC: 4.15 MIL/uL — ABNORMAL LOW (ref 4.22–5.81)
RDW: 15.8 % — AB (ref 11.5–15.5)
WBC: 14.3 10*3/uL — ABNORMAL HIGH (ref 4.0–10.5)

## 2017-03-04 MED ORDER — OXYCODONE HCL 5 MG PO TABS: 5.0000 mg | ORAL_TABLET | Freq: Four times a day (QID) | ORAL | 0 refills | Status: DC | PRN

## 2017-03-04 MED ORDER — MAGNESIUM SULFATE 2 GM/50ML IV SOLN
2.0000 g | Freq: Once | INTRAVENOUS | Status: AC
  Administered 2017-03-04: 2 g via INTRAVENOUS
  Filled 2017-03-04: qty 50

## 2017-03-04 MED ORDER — MAGNESIUM OXIDE 400 (241.3 MG) MG PO TABS
200.0000 mg | ORAL_TABLET | Freq: Two times a day (BID) | ORAL | Status: DC
  Administered 2017-03-04 – 2017-03-05 (×2): 200 mg via ORAL
  Filled 2017-03-04 (×2): qty 1

## 2017-03-04 MED ORDER — LISINOPRIL 40 MG PO TABS
40.0000 mg | ORAL_TABLET | Freq: Every day | ORAL | Status: DC
  Administered 2017-03-04 – 2017-03-05 (×2): 40 mg via ORAL
  Filled 2017-03-04 (×2): qty 1

## 2017-03-04 NOTE — Consult Note (Signed)
Cardiology Consultation:   Patient ID: YASSIR ENIS; [id number]; [birth year]   Admit date: 03/03/2017 Date of Consult: 03/04/2017  Primary Care Provider: Renato Shin, MD Primary Cardiologist: Dr. [name] Primary Electrophysiologist:  Dr. Rayann Heman   Patient Profile:   RANFERI CLINGAN is a 72 y.o. male with PMH CAD s/p LHC 07/1999 with PCI to LAD (also with 70% diffuse RCA disease), ICM s/p ICD (last EF 30-35% on Echo 03/2015), HTN, paroxysmal atrial fibrillation, HLD, COPD (noted on PFT 04/2015), and AAA s/p repair on 03/03/17 who is being seen today for the evaluation of NSVT at the request of Dr. Donzetta Matters.  History of Present Illness:   Mr. Pavey is a 72 y.o. male with PMH CAD s/p LHC 07/1999 with PCI to LAD (also with 70% diffuse RCA disease), ICM s/p ICD and chronic systolic heart failure (last EF 30-35% on Echo 03/2015), HTN, paroxysmal atrial fibrillation, HLD, COPD (noted on PFT 04/2015), and AAA who presented to [facility] for surgical intervention of AAA. Patient underwent percutaneous aortobiiliac endograft 03/03/17.   Last evaluated by cardiology oupatient on 02/05/17 for preoperative assessment for upcoming AAA repair. He was thought to be doing well from a cardiac standpoint without anginal complaints. With some baseline dyspnea due to COPD, unchanged in the past 2 months. No further ischemic work-up warranted at that time. Last ICD interrogation 01/2017 with normal device function.   He reports feeling good today. Has some baseline DOE which is unchanged. Denies any chest pain, SOB, palpitations, orthopnea, LE edema, lightheadedness, or dizziness. Has not felt his ICD fire. 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  Hospital course: Intermittently hypertensive and tachypneic, otherwise VSS. Labs with mild leukocytosis, Hgb 12.5, Cr. 0.76, K 4.0, Mg 1.5. Patient underwent successful percutaneous aortobiiliac endograft 03/03/17. Cardiology consulted for episodes of NSVT.      Past Medical History:  Diagnosis Date  . Adrenal mass ([place])    per pt this is remote (10 years) and benign by biopsy  . AICD (automatic cardioverter/defibrillator) present   . Anxiety   . Arthritis   . CAD (coronary artery disease)    a. s/p PCI to LAD 2001 b. myoview 2014 high risk with scar LAD/RCA territory but no ischemia  . Cardiomyopathy, ischemic   . CHF (congestive heart failure) ([name])    class II/III  . COPD (chronic obstructive pulmonary disease) ([name])    smokes cigars but has quit cigarettes  . Dyspnea   . Flu 03/2015  . HTN (hypertension)   . Hyperlipidemia   . Paroxysmal atrial fibrillation ([name]) 03/2015   chads2vasc score is 4  . PSA (psoriatic arthritis) ([place])    increased  . PVD (peripheral vascular disease) ([place])     Past Surgical History:  Procedure Laterality Date  . cataract surgery    . CORONARY ANGIOPLASTY WITH STENT PLACEMENT  2001   a. PCI to LAD  . IMPLANTABLE CARDIOVERTER DEFIBRILLATOR (ICD) GENERATOR CHANGE N/A 03/18/2014   a. SJM Fortify ST DR ICD implanted by [facility] for primary prevention b. gen change 03/2014   . LEAD REVISION/REPAIR N/A 07/17/2016   Procedure: Lead Revision/Repair;  Surgeon: Evans Lance, MD;  Location: [facility];  Service: Cardiovascular;  Laterality: N/A;  . LEFT HEART CATHETERIZATION WITH CORONARY ANGIOGRAM N/A 05/27/2014   Procedure: LEFT HEART CATHETERIZATION WITH CORONARY ANGIOGRAM;  Surgeon: Sherren Mocha, MD;  Location: [facility] CATH LAB;  Service: Cardiovascular;  Laterality: N/A;     Home Medications:  Prior to Admission medications   Medication Sig Start Date End Date Taking? Authorizing Provider  acetaminophen (TYLENOL) 500 MG tablet Take 1,000 mg by mouth daily as needed (back pain).   Yes [provider]  atorvastatin (LIPITOR) 80 MG tablet TAKE 1 TABLET BY MOUTH DAILY AT 6 PM. 01/01/17  Yes Allred, Jeneen Rinks, MD  carvedilol (COREG) 25 MG tablet Take 1 tablet (25 mg total) by mouth 2 (two) times daily with  a meal. 01/01/17  Yes Allred, Jeneen Rinks, MD  furosemide (LASIX) 40 MG tablet Take 0.5 tablets (20 mg total) by mouth daily. 01/01/17  Yes Allred, Jeneen Rinks, MD  lisinopril (PRINIVIL,ZESTRIL) 20 MG tablet Take 40 mg by mouth daily.   Yes [provider]  lisinopril (PRINIVIL,ZESTRIL) 40 MG tablet Take 1 tablet (40 mg total) by mouth daily. 01/01/17  Yes Allred, Jeneen Rinks, MD  liver oil-zinc oxide (DESITIN) 40 % ointment Apply 1 application topically as needed for irritation.   Yes [provider]  Multiple Vitamin (MULTIVITAMIN) capsule Take 1 capsule by mouth daily.     Yes [provider]  Omega-3 Fatty Acids (FISH OIL) 1200 MG CAPS Take 1,200 mg by mouth daily.   Yes [provider]  pantoprazole (PROTONIX) 40 MG tablet TAKE 1 TABLET BY MOUTH EVERYDAY AT BEDTIME 02/04/17  Yes Renato Shin, MD  potassium chloride SA (K-DUR,KLOR-CON) 20 MEQ tablet Take 1 tablet (20 mEq total) by mouth daily. 01/01/17  Yes Allred, Jeneen Rinks, MD  rivaroxaban (XARELTO) 20 MG TABS tablet Take 1 tablet (20 mg total) by mouth daily with supper. Patient taking differently: Take 20 mg by mouth daily after supper.  10/09/16  Yes Allred, Jeneen Rinks, MD  witch hazel-glycerin (TUCKS) pad Apply 1 application topically as needed for itching or irritation.   Yes [provider]  Fluticasone-Salmeterol (ADVAIR) 100-50 MCG/DOSE AEPB Inhale 1 puff into the lungs 2 (two) times daily. 04/10/15   Renato Shin, MD  oxyCODONE (ROXICODONE) 5 MG immediate release tablet Take 1 tablet (5 mg total) by mouth every 6 (six) hours as needed for severe pain. 03/04/17   Gabriel Earing, PA-C    Inpatient Medications: Scheduled Meds: . atorvastatin  80 mg Oral q1800  . carvedilol  25 mg Oral BID WC  . docusate sodium  100 mg Oral Daily  . furosemide  20 mg Oral Daily  . heparin  5,000 Units Subcutaneous Q8H  . multivitamin with minerals  1 tablet Oral Daily  . pantoprazole  40 mg Oral Daily   Continuous Infusions: .  sodium chloride    . sodium chloride 75 mL/hr (03/03/17 1131)   PRN Meds: sodium chloride, acetaminophen **OR** acetaminophen, alum & mag hydroxide-simeth, bisacodyl, guaiFENesin-dextromethorphan, hydrALAZINE, liver oil-zinc oxide, metoprolol tartrate, morphine injection, ondansetron, oxyCODONE-acetaminophen, phenol, polyethylene glycol, potassium chloride, witch hazel-glycerin  Allergies:   No Known Allergies  Social History:   Social History   Socioeconomic History  . Marital status: Married    Spouse name: Not on file  . Number of children: Not on file  . Years of education: Not on file  . Highest education level: Not on file  Social Needs  . Financial resource strain: Not on file  . Food insecurity - worry: Not on file  . Food insecurity - inability: Not on file  . Transportation needs - medical: Not on file  . Transportation needs - non-medical: Not on file  Occupational History  . Occupation: Lawn care  Tobacco Use  . Smoking status: Current Every Day Smoker  Types: Cigars  . Smokeless tobacco: Never Used  . Tobacco comment: smokes cigars now, no longer smokes cigarettes but previously smoked 2ppd  Substance and Sexual Activity  . Alcohol use: No  . Drug use: No  . Sexual activity: Not Currently    Partners: Female    Birth control/protection: None  Other Topics Concern  . Not on file  Social History Narrative  . Not on file    Family History:    Family History  Problem Relation Age of Onset  . Cirrhosis Mother        due to ETOH  . Cancer Neg Hx      ROS:  Please see the history of present illness.   All other ROS reviewed and negative.     Physical Exam/Data:   Vitals:   03/03/17 2355 03/04/17 0400 03/04/17 0835 03/04/17 1209  BP: (!) 155/69 (!) 170/68 (!) 150/76 134/60  Pulse: 65 64  60  Resp: 18 18 20  (!) 26  Temp: 97.6 F (36.4 C) 98 F (36.7 C) 98.5 F (36.9 C) 98 F (36.7 C)  TempSrc: Oral Oral Oral Oral  SpO2: 97% 96% 96% 92%    Weight:      Height:        Intake/Output Summary (Last 24 hours) at 03/04/2017 1615 Last data filed at 03/04/2017 1300 Gross per 24 hour  Intake 1763.75 ml  Output 1551 ml  Net 212.75 ml   Filed Weights   03/03/17 0617  Weight: 149 lb 9.6 oz (67.9 kg)   Body mass index is 20.86 kg/m.  General:  Well nourished, well developed, sitting on the edge of his bed in no acute distress HEENT: sclera anicteric  Neck: no JVD Vascular: No carotid bruits; distal pulses 2+ bilaterally Cardiac:  normal S1, S2; RRR; no murmur, gallops, or rubs Lungs:  Diffuse mild expiratory wheeze; no rhonchi or rales  Abd: NABS, soft, nontender, no hepatomegaly Ext: no edema; b/l groin incision sites C/D/I without significant ecchymosis/hematoma.  Musculoskeletal:  No deformities, BUE and BLE strength normal and equal Skin: warm and dry  Neuro:  CNs 2-12 intact, no focal abnormalities noted Psych:  Normal affect   EKG:  pending Telemetry:  Telemetry was personally reviewed and demonstrates:  NSR with occasional PVCs and short bursts of NSVT (max 5 beats), occasional atrial pacing.   Relevant CV Studies: None  Laboratory Data:  Chemistry Recent Labs  Lab 03/03/17 1115 03/04/17 0445  NA 138 137  K 4.0 4.0  CL 107 105  CO2 22 23  GLUCOSE 162* 114*  BUN 11 10  CREATININE 0.76 0.73  CALCIUM 8.0* 8.6*  GFRNONAA >60 >60  GFRAA >60 >60  ANIONGAP 9 9    No results for input(s): PROT, ALBUMIN, AST, ALT, ALKPHOS, BILITOT in the last 168 hours. Hematology Recent Labs  Lab 03/03/17 1115 03/04/17 0445  WBC 14.4* 14.3*  RBC 4.21* 4.15*  HGB 12.6* 12.5*  HCT 38.7* 37.5*  MCV 91.9 90.4  MCH 29.9 30.1  MCHC 32.6 33.3  RDW 16.0* 15.8*  PLT 143* 125*   Cardiac EnzymesNo results for input(s): TROPONINI in the last 168 hours. No results for input(s): TROPIPOC in the last 168 hours.  BNPNo results for input(s): BNP, PROBNP in the last 168 hours.  DDimer No results for input(s): DDIMER in the last  168 hours.  Radiology/Studies:  Dg Chest Port 1 View  Result Date: 03/03/2017 CLINICAL DATA:  Stent graft repair of abdominal aortic aneurysm. EXAM:  PORTABLE CHEST 1 VIEW COMPARISON:  07/18/2016 and 07/16/2016 FINDINGS: Heart size and vascularity are normal. No acute infiltrates or effusions. Chronic accentuation of the interstitial markings. No pneumothorax. AICD leads in place. Aortic atherosclerosis. IMPRESSION: No active disease. Aortic Atherosclerosis (ICD10-I70.0). Electronically Signed   By: Lorriane Shire M.D.   On: 03/03/2017 11:17    Assessment and Plan:   1. NSVT: patient noted to have occasional bursts of NSVT on telemetry post procedural (max 5 beats), after which atrial pacing initiated by ICD. ICD evaluated in 01/2017 with normal function. Admission labs with K 4.0, Mg 1.5.   - Will replete magnesium for goal >2.0 - Recheck electrolytes in the AM and replete for K>4, Mg >2  2. HTN: BP consistently elevated over the past 24 hours. Cr stable at 0.73 post procedure.  - Will restart his home lisinopril  3. Chronic systolic CHF: appears euvolemic on exam.  - continue home coreg, lisinopril, lasix.   4. Paroxysmal atrial fibrillation: No evidence of afib on telemetry - Restart xarelto when cleared by primary team.    5. CAD s/p PCI to LAD (2001): appears stable. Patient without anginal complaints - Continue statin  For questions or updates, please contact [place] Please consult [email or web address] for contact info under Cardiology/STEMI.   Signed, Abigail Butts, PA-C  03/04/2017 4:15 PM [phone]  As above, pt seen and examined; 72 yo male for evaluation of CAD, ICM, s/p ICD, HTN. PAF, COPD and now s/p recent stent graft to abdominal aorta for evaluation of NSVT. Had procedure yesterday and did well. Noted to have NSVT on telemetry and cardiology asked to evaluate. No chest pain, dyspnea, palpitations, presyncope or syncope. K 4 and Mg 1.5.  NSVT not unexpected in  pt with ICM; pt has ICD; would continue preadmission cardiac meds at DC including coreg and ACEI; supplement Mg (mg oxide 200 mg BID for 1 week). Resume xarelto for PAF when ok with surgery. Pt can be DCed from a cardiac standpoint. Please call with questions. Kirk Ruths, MD

## 2017-03-04 NOTE — Progress Notes (Signed)
Benjamin Cook has walked in hall x 2 today with no difficulty.  Ambulating in room to bathroom with frequent BM's, which he describes as loose, and states this is normal for him.  Taking food and fluid adequately with no c/o nausea.  States he has mild "soreness" in his bilateral groin, but not pain.  States today when he had a run of V tach, he was sleeping and had no pain or sob.  Per instructions from [place], [place], will have Cardiology evaluate him tonight for possible discharge in the am.

## 2017-03-04 NOTE — Plan of Care (Signed)
  Progressing Clinical Measurements: Diagnostic test results will improve 03/04/2017 2356 - Progressing by Peggye Pitt, RN   Completed/[facility] Behavior/Discharge Planning: Ability to manage health-related needs will improve 03/04/2017 2356 - Completed/Met by Peggye Pitt, RN Clinical Measurements: Ability to maintain clinical measurements within normal limits will improve 03/04/2017 2356 - Completed/Met by Peggye Pitt, RN Will remain free from infection 03/04/2017 2356 - Completed/Met by Peggye Pitt, RN Respiratory complications will improve 03/04/2017 2356 - Completed/Met by Peggye Pitt, RN Cardiovascular complication will be avoided 03/04/2017 2356 - Completed/Met by Peggye Pitt, RN Activity: Risk for activity intolerance will decrease 03/04/2017 2356 - Completed/Met by Peggye Pitt, RN Nutrition: Adequate nutrition will be maintained 03/04/2017 2356 - Completed/Met by Peggye Pitt, RN Elimination: Will not experience complications related to bowel motility 03/04/2017 2356 - Completed/Met by Peggye Pitt, RN Will not experience complications related to urinary retention 03/04/2017 2356 - Completed/Met by Peggye Pitt, RN

## 2017-03-04 NOTE — Progress Notes (Addendum)
Received call from Central Monitoring, Pt had a 6 beat run of V tach at 1445.  Converted back to SR.

## 2017-03-04 NOTE — Discharge Instructions (Signed)
° °[facility] [place]  ° °Discharge Instructions ° °Endovascular Aortic Aneurysm Repair ° °Please refer to the following instructions for your post-procedure care. Your surgeon or Physician Assistant will discuss any changes with you. ° °Activity ° °You are encouraged to walk as much as you can. You can slowly return to normal activities but must avoid strenuous activity and heavy lifting until your doctor tells you it's OK. Avoid activities such as vacuuming or swinging a gold club. It is normal to feel tired for several weeks after your surgery. Do not drive until your doctor gives the OK and you are no longer taking prescription pain medications. It is also normal to have difficulty with sleep habits, eating, and bowel movements after surgery. These will go away with time. ° °Bathing/Showering ° °You may shower after you go home. If you have an incision, do not soak in a bathtub, hot tub, or swim until the incision heals completely. ° °If you have incisions in your groin, sash the groin wounds with soap and water daily and pat dry. (No tub bath-only shower)  Then put a dry gauze or washcloth there to keep this area dry to help prevent wound infection daily and as needed.  Do not use Vaseline or neosporin on your incisions.  Only use soap and water on your incisions and then protect and keep dry. ° °Incision Care ° °Shower every day. Clean your incision with mild soap and water. Pat the area dry with a clean towel. You do not need a bandage unless otherwise instructed. Do not apply any ointments or creams to your incision. If you clothing is irritating, you may cover your incision with a dry gauze pad. ° °Diet ° °Resume your normal diet. There are no special food restrictions following this procedure. A low fat/low cholesterol diet is recommended for all patients with vascular disease. In order to heal from your surgery, it is CRITICAL to get adequate nutrition. Your body requires  vitamins, minerals, and protein. Vegetables are the best source of vitamins and minerals. Vegetables also provide the perfect balance of protein. Processed food has little nutritional value, so try to avoid this. ° °Medications ° °Resume taking all of your medications unless your doctor or nurse practitioner tells you not to. If your incision is causing pain, you may take over-the-counter pain relievers such as acetaminophen (Tylenol). If you were prescribed a stronger pain medication, please be aware these medications can cause nausea and constipation. Prevent nausea by taking the medication with a snack or meal. Avoid constipation by drinking plenty of fluids and eating foods with a high amount of fiber, such as fruits, vegetables, and grains. Do not take Tylenol if you are taking prescription pain medications. ° ° °Follow up ° °Our office will schedule a follow-up appointment with a C.T. scan 3-4 weeks after your surgery. ° °Please call us immediately for any of the following conditions ° °Severe or worsening pain in your legs or feet or in your abdomen back or chest. °Increased pain, redness, drainage (pus) from your incision sit. °Increased abdominal pain, bloating, nausea, vomiting or persistent diarrhea. °Fever of 101 degrees or higher. °Swelling in your leg (s), ° °Reduce your risk of vascular disease ° °•Stop smoking. If you would like help call [facility] at 1-800-QUIT-NOW (1-800-784-8669) or [place] at 336-586-4000. °•Manage your cholesterol °•Maintain a desired weight °•Control your diabetes °•Keep your blood pressure down ° °If you have questions, please call the office at [phone]. ° °

## 2017-03-04 NOTE — Progress Notes (Addendum)
  Progress Note    03/04/2017 7:17 AM 1 Day Post-Op  Subjective:  No complaints;  Vomited last night after dinner  Afebrile HR 60's-70's  250'N-397'Q systolic 73% [id number]  Vitals:   03/03/17 2355 03/04/17 0400  BP: (!) 155/69 (!) 170/68  Pulse: 65 64  Resp: 18 18  Temp: 97.6 F (36.4 C) 98 F (36.7 C)  SpO2: 97% 96%    Physical Exam: Cardiac:  regular Lungs:  Non labored Incisions:  Bilateral groins are soft without hematoma Extremities:  Monophasic right PT/peroneal; palpable left pedal pulses Abdomen:  Soft, NT/ND  CBC    Component Value Date/Time   WBC 14.3 (H) 03/04/2017 0445   RBC 4.15 (L) 03/04/2017 0445   HGB 12.5 (L) 03/04/2017 0445   HCT 37.5 (L) 03/04/2017 0445   PLT 125 (L) 03/04/2017 0445   MCV 90.4 03/04/2017 0445   MCH 30.1 03/04/2017 0445   MCHC 33.3 03/04/2017 0445   RDW 15.8 (H) 03/04/2017 0445   LYMPHSABS 1.3 11/17/2016 0538   MONOABS 1.9 (H) 11/17/2016 0538   EOSABS 0.3 11/17/2016 0538   BASOSABS 0.0 11/17/2016 0538    BMET    Component Value Date/Time   NA 137 03/04/2017 0445   K 4.0 03/04/2017 0445   CL 105 03/04/2017 0445   CO2 23 03/04/2017 0445   GLUCOSE 114 (H) 03/04/2017 0445   BUN 10 03/04/2017 0445   CREATININE 0.73 03/04/2017 0445   CREATININE 0.88 09/15/2015 0959   CALCIUM 8.6 (L) 03/04/2017 0445   GFRNONAA >60 03/04/2017 0445   GFRAA >60 03/04/2017 0445    INR    Component Value Date/Time   INR 1.18 03/03/2017 1115     Intake/Output Summary (Last 24 hours) at 03/04/2017 0717 Last data filed at 03/04/2017 0407 Gross per 24 hour  Intake 2483.75 ml  Output 1360 ml  Net 1123.75 ml     Assessment:  72 y.o. male is s/p:  1.  Percutaneous common femoral access bilaterally. 2.  Aortobiiliac endograft with main body right 28 x 14 x 12 extended with 20 x 11.5 cm and contralateral left limb 23 x 14cm   1 Day Post-Op  Plan: -pt doing well this am-abdomen soft.  He did vomit last night-we'll see how he tolerates  breakfast -monophasic doppler signals right PT/peroneal and palpable left pedal pulses.  Feet feel fine.  -out of bed and ambulate    Leontine Locket, PA-C [facility] [phone] 03/04/2017 7:17 AM    I have interviewed and examined patient with PA and agree with assessment and plan above. Monophasic signal on right pt and peroneal arteries but both feel are warm and well perfused and subjectively feeling well. Will give him regular diet this a.m and if he tolerates can likely dc later today.  Aylyn Wenzler C. Donzetta Matters, MD [facility] [place] Office: [phone] Pager: [phone]

## 2017-03-04 NOTE — Plan of Care (Signed)
  Progressing Health Behavior/Discharge Planning: Ability to manage health-related needs will improve 03/04/2017 0145 - Progressing by Peggye Pitt, RN Clinical Measurements: Will remain free from infection 03/04/2017 0145 - Progressing by Peggye Pitt, RN Pain Managment: General experience of comfort will improve 03/04/2017 0145 - Progressing by Peggye Pitt, RN   Completed/Met Education: Knowledge of General Education information will improve 03/04/2017 0145 - Completed/Met by Peggye Pitt, RN

## 2017-03-05 ENCOUNTER — Encounter (HOSPITAL_COMMUNITY): Payer: Self-pay | Admitting: Physician Assistant

## 2017-03-05 DIAGNOSIS — I4729 Other ventricular tachycardia: Secondary | ICD-10-CM

## 2017-03-05 DIAGNOSIS — I472 Ventricular tachycardia: Secondary | ICD-10-CM

## 2017-03-05 HISTORY — DX: Ventricular tachycardia: I47.2

## 2017-03-05 HISTORY — DX: Other ventricular tachycardia: I47.29

## 2017-03-05 LAB — BASIC METABOLIC PANEL
Anion gap: 9 (ref 5–15)
BUN: 8 mg/dL (ref 6–20)
CALCIUM: 8.2 mg/dL — AB (ref 8.9–10.3)
CHLORIDE: 105 mmol/L (ref 101–111)
CO2: 25 mmol/L (ref 22–32)
CREATININE: 0.68 mg/dL (ref 0.61–1.24)
GFR calc non Af Amer: >60 mL/min (ref >60)
GLUCOSE: 95 mg/dL (ref 65–99)
Potassium: 3.4 mmol/L — ABNORMAL LOW (ref 3.5–5.1)
Sodium: 139 mmol/L (ref 135–145)

## 2017-03-05 LAB — MAGNESIUM: Magnesium: 2 mg/dL (ref 1.7–2.4)

## 2017-03-05 MED ORDER — HEPARIN SODIUM (PORCINE) 5000 UNIT/ML IJ SOLN: 5000.0000 [IU] | Freq: Three times a day (TID) | INTRAMUSCULAR | Status: DC

## 2017-03-05 MED ORDER — RIVAROXABAN 20 MG PO TABS: 20.0000 mg | ORAL_TABLET | Freq: Every day | ORAL | Status: DC

## 2017-03-05 NOTE — Progress Notes (Signed)
Per CCMD, pt had 6 beats run of v-tach. Per CCMD, patient's ICD pacer spikes are not hitting where they need to. Will continue to monitor

## 2017-03-05 NOTE — Care Management Note (Signed)
Case Management Note Marvetta Gibbons RN, BSN Unit 4E-Case Manager [phone]  Patient Details  Name: Benjamin Cook MRN: [id number] Date of Birth: 03-Jun-1945  Subjective/Objective:   Pt admitted s/p AAA repair                Action/Plan: PTA Pt lived at home- plan to return home- no CM needs noted for transition to home.   Expected Discharge Date:  03/05/17               Expected Discharge Plan:  Home/Self Care  In-House Referral:  NA  Discharge planning Services  CM Consult, NA  Post Acute Care Choice:    Choice offered to:  NA  DME Arranged:    DME Agency:     HH Arranged:    HH Agency:     Status of Service:  Completed, signed off  If discussed at [employer] of Stay Meetings, dates discussed:    Additional Comments:  [name], RN 03/05/2017, 2:07 PM

## 2017-03-05 NOTE — Progress Notes (Signed)
  Progress Note    03/05/2017 8:01 AM 2 Days Post-Op  Subjective:  No overnight complaints  Vitals:   03/05/17 0605 03/05/17 0737  BP: (!) 162/75 (!) 160/84  Pulse: 69 66  Resp: 20 18  Temp: 98.9 F (37.2 C) 98.5 F (36.9 C)  SpO2:      Physical Exam: aaox3 Non labored respirations Abdomen is soft, no palpable mass Bilateral groin are soft Feet are warm, there is a palpable pulse left PT, nothing palpable on right but cap refill is wnl  CBC    Component Value Date/Time   WBC 14.3 (H) 03/04/2017 0445   RBC 4.15 (L) 03/04/2017 0445   HGB 12.5 (L) 03/04/2017 0445   HCT 37.5 (L) 03/04/2017 0445   PLT 125 (L) 03/04/2017 0445   MCV 90.4 03/04/2017 0445   MCH 30.1 03/04/2017 0445   MCHC 33.3 03/04/2017 0445   RDW 15.8 (H) 03/04/2017 0445   LYMPHSABS 1.3 11/17/2016 0538   MONOABS 1.9 (H) 11/17/2016 0538   EOSABS 0.3 11/17/2016 0538   BASOSABS 0.0 11/17/2016 0538    BMET    Component Value Date/Time   NA 139 03/05/2017 0321   K 3.4 (L) 03/05/2017 0321   CL 105 03/05/2017 0321   CO2 25 03/05/2017 0321   GLUCOSE 95 03/05/2017 0321   BUN 8 03/05/2017 0321   CREATININE 0.68 03/05/2017 0321   CREATININE 0.88 09/15/2015 0959   CALCIUM 8.2 (L) 03/05/2017 0321   GFRNONAA >60 03/05/2017 0321   GFRAA >60 03/05/2017 0321    INR    Component Value Date/Time   INR 1.18 03/03/2017 1115     Intake/Output Summary (Last 24 hours) at 03/05/2017 0801 Last data filed at 03/05/2017 0329 Gross per 24 hour  Intake 740 ml  Output 851 ml  Net -111 ml     Assessment:  72 y.o. male is s/p EVAR, cardiology evaluated yesterday and ok for discharge on home dose xarelto. vtach run o/n  Plan: -will touch base with cardiology again this a.m. -plan d/c from vascular standpoint -xarelto restarted -diet as tolerated   Brandon C. Donzetta Matters, MD [facility] [place] Office: [phone] Pager: [phone]  03/05/2017 8:01 AM

## 2017-03-05 NOTE — Discharge Summary (Signed)
EVAR Discharge Summary   Benjamin Cook 14-Nov-1945 72 y.o. male  MRN: [id number]  Admission Date: 03/03/2017  Discharge Date: 03/04/17  Physician: Thomes Lolling*  Admission Diagnosis: abdominal aortic aneurysm   HPI:   This is a 72 y.o. male with history of abdominal aortic aneurysm that was noted to be greater than 5 cm at most recent hospital visit which was for diverticulitis. He is now over this although he is having some abdominal pain radiating to his back since the diverticulitis episode. He is no longer on antibiotics. He is eating although his appetite has been somewhat decreased. He does not take any blood thinners. He does have a AICD in place with diagnosis of congestive heart failure.  Hospital Course:  The patient was admitted to the hospital and taken to the operating room on 03/03/2017 and underwent: Procedure Performed: 1.  Percutaneous common femoral access bilaterally. 2.  Aortobiiliac endograft with main body right 28 x 14 x 12 extended with 20 x 11.5 cm and contralateral left limb 23 x 14cm   Findings: The right common iliac artery had obvious dissection and the hypogastric artery branch posteriorly and inferiorly.  I was unable to get into the branch from either axial contralateral approach and so this was not coiled as was the previous plan.  At completion we did have exclusion of the aneurysm just at the level of the left renal artery which was the lowest with possibly a late type II endoleak from an IMA.  We were sealed in both common iliac arteries and both hypogastric arteries are patent.  There is a multiphasic signal at the left posterior tibial artery and a monophasic at the right.    The pt tolerated the procedure well and was transported to the PACU in good condition.   By POD 1, he had monophasic doppler signals right PT/peroneal and palpable left pedal pulses.  His feet were feeling fine.  He did have a 5 beat run of asymptomatic  VT that evening and another one later in the day.  Cardiology was consulted.   Had procedure yesterday and did well. Noted to have NSVT on telemetry and cardiology asked to evaluate. No chest pain, dyspnea, palpitations, presyncope or syncope. K 4 and Mg 1.5.  NSVT not unexpected in pt with ICM; pt has ICD; would continue preadmission cardiac meds at DC including coreg and ACEI; supplement Mg (mg oxide 200 mg BID for 1 week). Resume xarelto for PAF when ok with surgery. Pt can be DCed from a cardiac standpoint. Please call with questions.  On POD 2, his Xarelto is restarted that morning.  Early in the morning of POD 2, per the nursing note, pt had a 6 beats run of v-tach. Per CCMD, patient's ICD pacer spikes are not hitting where they need to.  Cardiology was asked to check on this prior to discharge.   Per cardiology, pt denies dyspnea, chest pain, palpitations, presyncope or syncope.  He has short runs of nonsustained ventricular tachycardia on telemetry longest being 6 beats (personally reviewed).  He has a known ischemic cardiomyopathy and ICD in place.  Continue preadmission medications including ACE inhibitor and beta-blocker.  Supplement electrolytes.  Patient can be discharged from a cardiac standpoint.  Please call with questions.  The remainder of the hospital course consisted of increasing mobilization and increasing intake of solids without difficulty.  CBC    Component Value Date/Time   WBC 14.3 (H) 03/04/2017 0445   RBC 4.15 (L)  03/04/2017 0445   HGB 12.5 (L) 03/04/2017 0445   HCT 37.5 (L) 03/04/2017 0445   PLT 125 (L) 03/04/2017 0445   MCV 90.4 03/04/2017 0445   MCH 30.1 03/04/2017 0445   MCHC 33.3 03/04/2017 0445   RDW 15.8 (H) 03/04/2017 0445   LYMPHSABS 1.3 11/17/2016 0538   MONOABS 1.9 (H) 11/17/2016 0538   EOSABS 0.3 11/17/2016 0538   BASOSABS 0.0 11/17/2016 0538    BMET    Component Value Date/Time   NA 139 03/05/2017 0321   K 3.4 (L) 03/05/2017 0321   CL 105  03/05/2017 0321   CO2 25 03/05/2017 0321   GLUCOSE 95 03/05/2017 0321   BUN 8 03/05/2017 0321   CREATININE 0.68 03/05/2017 0321   CREATININE 0.88 09/15/2015 0959   CALCIUM 8.2 (L) 03/05/2017 0321   GFRNONAA >60 03/05/2017 0321   GFRAA >60 03/05/2017 0321         Discharge Diagnosis:  abdominal aortic aneurysm  Secondary Diagnosis: Patient Active Problem List   Diagnosis Date Noted  . AAA (abdominal aortic aneurysm) ([place]) 03/03/2017  . Low back pain 01/01/2017  . Adrenal mass ([place]) 11/24/2016  . Acute Sigmoid diverticulitis 11/15/2016  . Diverticulitis 11/15/2016  . ICD (implantable cardioverter-defibrillator) lead failure 07/16/2016  . Paroxysmal atrial fibrillation ([place]) 06/26/2015  . Dyspnea 04/10/2015  . Acute respiratory failure ([place]) 03/28/2015  . Essential hypertension 03/28/2015  . Systolic CHF ([place]) 16/96/7893  . Pyrexia   . Shortness of breath   . Influenza A   . VT (ventricular tachycardia) ([place])   . ICD (implantable cardioverter-defibrillator) in place 06/08/2010  . Chronic systolic heart failure ([place]) 04/18/2010  . WEIGHT LOSS 11/01/2008  . TOBACCO ABUSE 09/06/2008  . Aneurysm of abdominal vessel (5.6 CM) 09/06/2008  . HLD (hyperlipidemia) 09/05/2008  . COUGH 04/08/2007  . PSA, INCREASED 04/08/2007  . OTHER ABNORMAL BLOOD CHEMISTRY 03/24/2007  . Adrenal nodule ([place]) 10/01/2006  . Anxiety state 10/01/2006  . HYPERTENSION 10/01/2006  . Coronary atherosclerosis 10/01/2006  . Cardiomyopathy, ischemic 10/01/2006  . COPD, severe ([place]) 10/01/2006  . HYPERGLYCEMIA 10/01/2006  . HEMATURIA, HX OF 10/01/2006   Past Medical History:  Diagnosis Date  . Adrenal mass ([place])    per pt this is remote (10 years) and benign by biopsy  . AICD (automatic cardioverter/defibrillator) present   . Anxiety   . Arthritis   . CAD (coronary artery disease)    a. s/p PCI to LAD 2001 b. myoview 2014 high risk with scar LAD/RCA territory but no ischemia  . Cardiomyopathy,  ischemic   . CHF (congestive heart failure) ([name])    class II/III  . COPD (chronic obstructive pulmonary disease) ([name])    smokes cigars but has quit cigarettes  . Dyspnea   . Flu 03/2015  . HTN (hypertension)   . Hyperlipidemia   . Paroxysmal atrial fibrillation ([name]) 03/2015   chads2vasc score is 4  . PSA (psoriatic arthritis) ([place])    increased  . PVD (peripheral vascular disease) ([place])      Allergies as of 03/05/2017   No Known Allergies     Medication List    TAKE these medications   acetaminophen 500 MG tablet Commonly known as:  TYLENOL Take 1,000 mg by mouth daily as needed (back pain).   atorvastatin 80 MG tablet Commonly known as:  LIPITOR TAKE 1 TABLET BY MOUTH DAILY AT 6 PM.   carvedilol 25 MG tablet Commonly known as:  COREG Take 1 tablet (25 mg total) by  mouth 2 (two) times daily with a meal.   Fish Oil 1200 MG Caps Take 1,200 mg by mouth daily.   Fluticasone-Salmeterol 100-50 MCG/DOSE Aepb Commonly known as:  ADVAIR Inhale 1 puff into the lungs 2 (two) times daily.   furosemide 40 MG tablet Commonly known as:  LASIX Take 0.5 tablets (20 mg total) by mouth daily.   lisinopril 20 MG tablet Commonly known as:  PRINIVIL,ZESTRIL Take 40 mg by mouth daily.   lisinopril 40 MG tablet Commonly known as:  PRINIVIL,ZESTRIL Take 1 tablet (40 mg total) by mouth daily.   liver oil-zinc oxide 40 % ointment Commonly known as:  DESITIN Apply 1 application topically as needed for irritation.   multivitamin capsule Take 1 capsule by mouth daily.   oxyCODONE 5 MG immediate release tablet Commonly known as:  ROXICODONE Take 1 tablet (5 mg total) by mouth every 6 (six) hours as needed for severe pain.   pantoprazole 40 MG tablet Commonly known as:  PROTONIX TAKE 1 TABLET BY MOUTH EVERYDAY AT BEDTIME   potassium chloride SA 20 MEQ tablet Commonly known as:  K-DUR,KLOR-CON Take 1 tablet (20 mEq total) by mouth daily.   rivaroxaban 20 MG Tabs  tablet Commonly known as:  XARELTO Take 1 tablet (20 mg total) by mouth daily with supper. What changed:  when to take this   witch hazel-glycerin pad Commonly known as:  TUCKS Apply 1 application topically as needed for itching or irritation.       Discharge Instructions:  [facility]  Discharge Instructions Endovascular Aortic Aneurysm Repair  Please refer to the following instructions for your post-procedure care. Your surgeon or Physician Assistant will discuss any changes with you.  Activity  You are encouraged to walk as much as you can. You can slowly return to normal activities but must avoid strenuous activity and heavy lifting until your doctor tells you it's OK. Avoid activities such as vacuuming or swinging a gold club. It is normal to feel tired for several weeks after your surgery. Do not drive until your doctor gives the OK and you are no longer taking prescription pain medications. It is also normal to have difficulty with sleep habits, eating, and bowel movements after surgery. These will go away with time.  Bathing/Showering  You may shower after you go home. If you have an incision, do not soak in a bathtub, hot tub, or swim until the incision heals completely.  Incision Care  Shower every day. Clean your incision with mild soap and water. Pat the area dry with a clean towel. You do not need a bandage unless otherwise instructed. Do not apply any ointments or creams to your incision. If you clothing is irritating, you may cover your incision with a dry gauze pad.  Diet  Resume your normal diet. There are no special food restrictions following this procedure. A low fat/low cholesterol diet is recommended for all patients with vascular disease. In order to heal from your surgery, it is CRITICAL to get adequate nutrition. Your body requires vitamins, minerals, and protein. Vegetables are the best source of vitamins and minerals.  Vegetables also provide the perfect balance of protein. Processed food has little nutritional value, so try to avoid this.  Medications  Resume taking all of your medications unless your doctor or Physician Assistnat tells you not to. If your incision is causing pain, you may take over-the-counter pain relievers such as acetaminophen (Tylenol). If you were prescribed a stronger  pain medication, please be aware these medications can cause nausea and constipation. Prevent nausea by taking the medication with a snack or meal. Avoid constipation by drinking plenty of fluids and eating foods with a high amount of fiber, such as fruits, vegetables, and grains. Do not take Tylenol if you are taking prescription pain medications.   Follow up  [place] office will schedule a follow-up appointment with a C.T. scan 3-4 weeks after your surgery.  Please call us immediately for any of the following conditions  Severe or worsening pain in your legs or feet or in your abdomen back or chest. Increased pain, redness, drainage (pus) from your incision sit. Increased abdominal pain, bloating, nausea, vomiting or persistent diarrhea. Fever of 101 degrees or higher. Swelling in your leg (s),  Reduce your risk of vascular disease  .Stop smoking. If you would like help call [facility] at 1-800-QUIT-NOW 757-753-6760) or [place] at [phone]. .Manage your cholesterol .Maintain a desired weight .Control your diabetes .Keep your blood pressure down  If you have questions, please call the office at [phone].    Prescriptions given: Roxicodone #8 No Refill  Disposition: home  Patient's condition: is Good  Follow up: 1. Dr. Donzetta Matters in 4 weeks with CTA protocol 2. Remote home device check with [facility] 03/06/17   Leontine Locket, PA-C [facility] [phone] 03/05/2017  7:43 AM   - For VQI Registry use - Post-op:  Time to Extubation: [x]  In OR, [ ]  < 12 hrs, [ ]  12-24 hrs, [ ]   >=24 hrs Vasopressors Req. Post-op: No MI: No., [ ]  Troponin only, [ ]  EKG or Clinical New Arrhythmia: couple runs of Vtach--known cardiomyopathy and ICD CHF: No ICU Stay: 2 day in stepdown Transfusion: No     If yes, n/a units given  Complications: Resp failure: No., [ ]  Pneumonia, [ ]  Ventilator Chg in renal function: No., [ ]  Inc. Cr > 0.5, [ ]  Temp. Dialysis,  [ ]  Permanent dialysis Leg ischemia: No., no Surgery needed, [ ]  Yes, Surgery needed,  [ ]  Amputation Bowel ischemia: No., [ ]  Medical Rx, [ ]  Surgical Rx Wound complication: No., [ ]  Superficial separation/infection, [ ]  Return to OR Return to OR: No  Return to OR for bleeding: No Stroke: No., [ ]  Minor, [ ]  Major  Discharge medications: Statin use:  Yes  ASA use:  Yes  Plavix use:  No  Beta blocker use:  Yes  ARB use:  No ACEI use:  Yes CCB use:  Yes Xarelto:  Yes

## 2017-03-05 NOTE — Progress Notes (Signed)
Per CCMD, patient had 5 beat run vtach. Will continue to monitor

## 2017-03-05 NOTE — Progress Notes (Signed)
Progress Note  Patient Name: Benjamin Cook Date of Encounter: 03/05/2017  Primary Cardiologist: Dr [name]   Subjective   Feels fine, ready for d/c, no sx from the VT  Inpatient Medications    Scheduled Meds: . atorvastatin  80 mg Oral q1800  . carvedilol  25 mg Oral BID WC  . docusate sodium  100 mg Oral Daily  . furosemide  20 mg Oral Daily  . lisinopril  40 mg Oral Daily  . magnesium oxide  200 mg Oral BID  . multivitamin with minerals  1 tablet Oral Daily  . pantoprazole  40 mg Oral Daily  . rivaroxaban  20 mg Oral Daily   Continuous Infusions: . sodium chloride    . sodium chloride 75 mL/hr (03/03/17 1131)   PRN Meds: sodium chloride, acetaminophen **OR** acetaminophen, alum & mag hydroxide-simeth, bisacodyl, guaiFENesin-dextromethorphan, hydrALAZINE, liver oil-zinc oxide, metoprolol tartrate, morphine injection, ondansetron, oxyCODONE-acetaminophen, phenol, polyethylene glycol, potassium chloride, witch hazel-glycerin   Vital Signs    Vitals:   03/04/17 2008 03/04/17 2316 03/05/17 0605 03/05/17 0737  BP: (!) 142/65 (!) 150/71 (!) 162/75 (!) 160/84  Pulse: 87 69 69 66  Resp: (!) 25 (!) 27 20 18   Temp: 99 F (37.2 C) 98.8 F (37.1 C) 98.9 F (37.2 C) 98.5 F (36.9 C)  TempSrc: Oral Oral Oral Oral  SpO2: 93% 91%    Weight:      Height:        Intake/Output Summary (Last 24 hours) at 03/05/2017 0913 Last data filed at 03/05/2017 4944 Gross per 24 hour  Intake 558 ml  Output 601 ml  Net -43 ml   Filed Weights   03/03/17 0617  Weight: 149 lb 9.6 oz (67.9 kg)    Telemetry    SR, multiple brief runs of NSVT - Personally Reviewed  ECG    None this admit - Personally Reviewed  Physical Exam   General: Well developed, well nourished, male appearing in no acute distress. Head: Normocephalic, atraumatic.  Neck: Supple without bruits, JVD not elevated. Lungs:  Resp regular and unlabored, decreased BS L base Heart: RRR, S1, S2, no S3, S4, or  murmur; no rub. Abdomen: Soft, non-tender, non-distended with normoactive bowel sounds. No hepatomegaly. No rebound/guarding. No obvious abdominal masses. Extremities: No clubbing, cyanosis, no edema. Distal pedal pulses are 2+ bilaterally. Neuro: Alert and oriented X 3. Moves all extremities spontaneously. Psych: Normal affect.  Labs    Hematology Recent Labs  Lab 03/03/17 1115 03/04/17 0445  WBC 14.4* 14.3*  RBC 4.21* 4.15*  HGB 12.6* 12.5*  HCT 38.7* 37.5*  MCV 91.9 90.4  MCH 29.9 30.1  MCHC 32.6 33.3  RDW 16.0* 15.8*  PLT 143* 125*    Chemistry Recent Labs  Lab 03/03/17 1115 03/04/17 0445 03/05/17 0321  NA 138 137 139  K 4.0 4.0 3.4*  CL 107 105 105  CO2 22 23 25   GLUCOSE 162* 114* 95  BUN 11 10 8   CREATININE 0.76 0.73 0.68  CALCIUM 8.0* 8.6* 8.2*  GFRNONAA >60 >60 >60  GFRAA >60 >60 >60  ANIONGAP 9 9 9      Radiology    Dg Chest Port 1 View  Result Date: 03/03/2017 CLINICAL DATA:  Stent graft repair of abdominal aortic aneurysm. EXAM: PORTABLE CHEST 1 VIEW COMPARISON:  07/18/2016 and 07/16/2016 FINDINGS: Heart size and vascularity are normal. No acute infiltrates or effusions. Chronic accentuation of the interstitial markings. No pneumothorax. AICD leads in place. Aortic atherosclerosis. IMPRESSION: No  active disease. Aortic Atherosclerosis (ICD10-I70.0). Electronically Signed   By: Lorriane Shire M.D.   On: 03/03/2017 11:17     Cardiac Studies   None  Patient Profile     72 y.o. male w/ hx CAD s/p LHC 07/1999 with PCI to LAD (also with 70% diffuse RCA disease), ICM s/p ICD (last EF 30-35% on Echo 03/2015), HTN, paroxysmal atrial fibrillation, HLD, COPD (noted on PFT 04/2015), and AAA s/p repair on 03/03/17 who was seen 01/29 for the evaluation of NSVT/VT.   Assessment & Plan    Principal Problem: 1.  AAA (abdominal aortic aneurysm) ([place]) - per [facility], Dr Donzetta Matters, ready for d/c   Active Problems: 2.  NSVT (nonsustained ventricular tachycardia) ([name]) -  pt has multiple runs, all < 10 bts, asymptomatic - has ICD in place, no recent therapies. - is on high dose BB, Coreg 25 mg bid - at this time, no further eval in asymptomatic pt - will send message to get him a f/u appt - keep appt 01/31 for remote device check from home. -  Ok to d/c from a cards standpoint.  SignedRosaria Ferries , PA-C 9:13 AM 03/05/2017 Pager: [phone] As above, patient seen and examined.  Patient denies dyspnea, chest pain, palpitations, presyncope or syncope.  He has short runs of nonsustained ventricular tachycardia on telemetry longest being 6 beats (personally reviewed).  He has a known ischemic cardiomyopathy and ICD in place.  Continue preadmission medications including ACE inhibitor and beta-blocker.  Supplement electrolytes.  Patient can be discharged from a cardiac standpoint.  Please call with questions. Kirk Ruths, MD

## 2017-03-06 ENCOUNTER — Other Ambulatory Visit: Payer: Self-pay

## 2017-03-06 ENCOUNTER — Ambulatory Visit (INDEPENDENT_AMBULATORY_CARE_PROVIDER_SITE_OTHER): Payer: Self-pay

## 2017-03-06 ENCOUNTER — Telehealth: Payer: Self-pay | Admitting: Vascular Surgery

## 2017-03-06 DIAGNOSIS — I714 Abdominal aortic aneurysm, without rupture, unspecified: Secondary | ICD-10-CM

## 2017-03-06 DIAGNOSIS — I5022 Chronic systolic (congestive) heart failure: Secondary | ICD-10-CM

## 2017-03-06 DIAGNOSIS — Z48812 Encounter for surgical aftercare following surgery on the circulatory system: Secondary | ICD-10-CM

## 2017-03-06 DIAGNOSIS — Z95810 Presence of automatic (implantable) cardiac defibrillator: Secondary | ICD-10-CM

## 2017-03-06 NOTE — Progress Notes (Signed)
EPIC Encounter for ICM Monitoring  Patient Name: Benjamin Cook is a 72 y.o. male Date: 03/06/2017 Primary Care Physican: Renato Shin, MD Primary Cardiologist:[place] Electrophysiologist: Allred Dry Weight: unknown       Heart Failure questions reviewed, pt asymptomatic.  He said AAA surgery went well.     Thoracic impedance normal but did appear to have some accumulation the day of AAA surgery on 03/03/17.  Prescribed dosage: Furosemide 40 mg0.5tablet (20 mg total)daily. Potassium 20 mEq 1 tablet daily.  Labs: 03/05/2017 Creatinine 0.68, BUN 8, Potassium 3.4,   Sodium 139, EGFR >60 03/04/2017 Creatinine 0.73, BUN 10, Potassium 4.0, Sodium 137, EGFR>60 03/03/2017 Creatinine 0.76, BUN 11, Potassium 4.0, Sodium 138, EGFR >60 02/25/2017 Creatinine 0.86, BUN 5,   Potassium 4.0, Sodium 139, EGFR >60 11/30/2016 Creatinine0.81, BUN<5, Potassium3.8, Sodium140, EGFR>60 11/29/2016 Creatinine0.83, BUN5, Potassium 3.6, Sodium140, EGFR>60  11/28/2016 Creatinine0.82, BUN6, Potassium 3.7, Sodium140, EGFR>60  11/27/2016 Creatinine0.78, BUN8, Potassium 3.1, Sodium139, EGFR>60  11/26/2016 Creatinine0.90, BUN6, Potassium 3.7, Sodium136, EGFR>60  11/25/2016 Creatinine0.79, BUN7, Potassium 3.8, Sodium134, EGFR>60  11/24/2016 Creatinine0.81, BUN7, Potassium 3.9, Sodium135, EGFR>60 11/18/2016 Creatinine0.70, BUN<5, Potassium3.2, Sodium138, EGFR>60 11/17/2016 Creatinine0.68, BUN<5, Potassium3.2, Sodium136, EGFR>60  11/16/2016 Creatinine0.83, BUN7, Potassium 3.4, Sodium136, EGFR>60  11/15/2016 Creatinine0.71, BUN7, Potassium 4.0, Sodium136, EGFR>60 07/17/2016 Creatinine 0.86, BUN 10, Potassium 3.4, Sodium 39, EGFR >60 09/15/2015 Creatinine 0.88, BUN 10, Potassium 4.6, Sodium 142  Recommendations: No changes.   Encouraged to call for fluid symptoms.  Follow-up plan: ICM clinic phone appointment on 03/24/2017.    Copy of ICM  check sent to Dr. Rayann Heman.   3 month ICM trend: 03/06/2017    1 Year ICM trend:       [name], RN 03/06/2017 10:07 AM

## 2017-03-06 NOTE — Telephone Encounter (Signed)
-----   Message from Mena Goes, RN sent at 03/05/2017  9:10 AM EST ----- Regarding: 4 weeks CTA protocol   ----- Message ----- From: Gabriel Earing, PA-C Sent: 03/05/2017   7:33 AM To: [facility] Charge Pool  S/p EVAR.  F/u with Dr. Donzetta Matters in 4 weeks with CTA protocol.  Thanks

## 2017-03-06 NOTE — Telephone Encounter (Signed)
Sched appts 04/04/17. CTA at 9:30 at [place] and MD at 10:30. Spoke to pt's wife to inform them of appts and of CTA instructions.

## 2017-03-10 ENCOUNTER — Telehealth: Payer: Self-pay | Admitting: *Deleted

## 2017-03-10 NOTE — Telephone Encounter (Signed)
Wife called and states that Benjamin Cook is having some swelling and redness in both groin incisions. He is post EVAR on 03-03-17 by Dr. Donzetta Matters. She states that he is afebrile but is having pain in the groin area and up into his right flank. He is not having any problems with bowel or bladder routine. No reported signs of UTI per wife. Will bring him in to see our NP tomorrow for wound check and to discuss the possibility of more pain medications.

## 2017-03-11 ENCOUNTER — Encounter: Payer: Self-pay | Admitting: Family

## 2017-03-11 ENCOUNTER — Ambulatory Visit (INDEPENDENT_AMBULATORY_CARE_PROVIDER_SITE_OTHER): Payer: Medicare Other | Admitting: Family

## 2017-03-11 VITALS — BP 117/71 | HR 55 | Temp 97.1°F | Resp 20 | Ht 71.0 in | Wt 142.0 lb

## 2017-03-11 DIAGNOSIS — I714 Abdominal aortic aneurysm, without rupture, unspecified: Secondary | ICD-10-CM

## 2017-03-11 DIAGNOSIS — Z95828 Presence of other vascular implants and grafts: Secondary | ICD-10-CM

## 2017-03-11 DIAGNOSIS — F17290 Nicotine dependence, other tobacco product, uncomplicated: Secondary | ICD-10-CM

## 2017-03-11 MED ORDER — TRAMADOL HCL 50 MG PO TABS: 50.0000 mg | ORAL_TABLET | Freq: Two times a day (BID) | ORAL | 0 refills | Status: DC | PRN

## 2017-03-11 NOTE — Progress Notes (Signed)
Post-operative EVAR   History of Present Illness  Benjamin Cook is a 72 y.o. male who is s/p EVAR on 03-03-17 by Dr. Donzetta Matters for AAA.  He has a 4 week follow up with CTA and Dr. Donzetta Matters on 04-04-17.   He returns today due to requesting more analgic prescription before he returns on 04-04-17, has two tablets remaining of oxycodone 5 mg, right groin tingling/burning sensation present since surgery.   He was prescribed 8 tabs of oxycodone 5 mg on 03-04-17, 1 tab po every 6 hours prn pain, by S. Rhyne PA-C.    He is moving his bowels, has taken a shower.    The patient has no had back or abdominal pain.   For VQI Use Only  PRE-ADM LIVING: Home  AMB STATUS: Ambulatory   Past Medical History:  Diagnosis Date  . Adrenal mass ([place])    per pt this is remote (10 years) and benign by biopsy  . AICD (automatic cardioverter/defibrillator) present   . Anxiety   . Arthritis   . CAD (coronary artery disease)    a. s/p PCI to LAD 2001 b. myoview 2014 high risk with scar LAD/RCA territory but no ischemia  . Cardiomyopathy, ischemic   . CHF (congestive heart failure) ([name])    class II/III  . COPD (chronic obstructive pulmonary disease) ([name])    smokes cigars but has quit cigarettes  . Dyspnea   . Flu 03/2015  . HTN (hypertension)   . Hyperlipidemia   . NSVT (nonsustained ventricular tachycardia) ([place]) 03/05/2017  . Paroxysmal atrial fibrillation ([name]) 03/2015   chads2vasc score is 4  . PSA (psoriatic arthritis) ([place])    increased  . PVD (peripheral vascular disease) ([place])     Past Surgical History:  Procedure Laterality Date  . ABDOMINAL AORTIC ENDOVASCULAR STENT GRAFT N/A 03/03/2017   Procedure: ABDOMINAL AORTIC ENDOVASCULAR STENT GRAFT;  Surgeon: Waynetta Sandy, MD;  Location: [facility];  Service: Vascular;  Laterality: N/A;  . cataract surgery    . CORONARY ANGIOPLASTY WITH STENT PLACEMENT  2001   a. PCI to LAD  . IMPLANTABLE CARDIOVERTER DEFIBRILLATOR (ICD) GENERATOR CHANGE  N/A 03/18/2014   a. SJM Fortify ST DR ICD implanted by [facility] for primary prevention b. gen change 03/2014   . LEAD REVISION/REPAIR N/A 07/17/2016   Procedure: Lead Revision/Repair;  Surgeon: Evans Lance, MD;  Location: [facility];  Service: Cardiovascular;  Laterality: N/A;  . LEFT HEART CATHETERIZATION WITH CORONARY ANGIOGRAM N/A 05/27/2014   Procedure: LEFT HEART CATHETERIZATION WITH CORONARY ANGIOGRAM;  Surgeon: Sherren Mocha, MD;  Location: [facility] CATH LAB;  Service: Cardiovascular;  Laterality: N/A;    Social History   Socioeconomic History  . Marital status: Married    Spouse name: Not on file  . Number of children: Not on file  . Years of education: Not on file  . Highest education level: Not on file  Social Needs  . Financial resource strain: Not on file  . Food insecurity - worry: Not on file  . Food insecurity - inability: Not on file  . Transportation needs - medical: Not on file  . Transportation needs - non-medical: Not on file  Occupational History  . Occupation: Lawn care  Tobacco Use  . Smoking status: Current Every Day Smoker    Types: Cigars  . Smokeless tobacco: Never Used  . Tobacco comment: smokes cigars now, no longer smokes cigarettes but previously smoked 2ppd  Substance and Sexual Activity  .  Alcohol use: No  . Drug use: No  . Sexual activity: Not Currently    Partners: Female    Birth control/protection: None  Other Topics Concern  . Not on file  Social History Narrative  . Not on file    No Known Allergies  Current Outpatient Medications on File Prior to Visit  Medication Sig Dispense Refill  . acetaminophen (TYLENOL) 500 MG tablet Take 1,000 mg by mouth daily as needed (back pain).    Marland Kitchen atorvastatin (LIPITOR) 80 MG tablet TAKE 1 TABLET BY MOUTH DAILY AT 6 PM. 90 tablet 2  . carvedilol (COREG) 25 MG tablet Take 1 tablet (25 mg total) by mouth 2 (two) times daily with a meal. 180 tablet 3  . Fluticasone-Salmeterol (ADVAIR) 100-50 MCG/DOSE  AEPB Inhale 1 puff into the lungs 2 (two) times daily. 1 each 3  . furosemide (LASIX) 40 MG tablet Take 0.5 tablets (20 mg total) by mouth daily. 90 tablet 3  . lisinopril (PRINIVIL,ZESTRIL) 20 MG tablet Take 40 mg by mouth daily.    Marland Kitchen lisinopril (PRINIVIL,ZESTRIL) 40 MG tablet Take 1 tablet (40 mg total) by mouth daily. 30 tablet 11  . liver oil-zinc oxide (DESITIN) 40 % ointment Apply 1 application topically as needed for irritation.    . Multiple Vitamin (MULTIVITAMIN) capsule Take 1 capsule by mouth daily.      . Omega-3 Fatty Acids (FISH OIL) 1200 MG CAPS Take 1,200 mg by mouth daily.    Marland Kitchen oxyCODONE (ROXICODONE) 5 MG immediate release tablet Take 1 tablet (5 mg total) by mouth every 6 (six) hours as needed for severe pain. 8 tablet 0  . pantoprazole (PROTONIX) 40 MG tablet TAKE 1 TABLET BY MOUTH EVERYDAY AT BEDTIME 30 tablet 0  . potassium chloride SA (K-DUR,KLOR-CON) 20 MEQ tablet Take 1 tablet (20 mEq total) by mouth daily. 90 tablet 2  . rivaroxaban (XARELTO) 20 MG TABS tablet Take 1 tablet (20 mg total) by mouth daily with supper. (Patient taking differently: Take 20 mg by mouth daily after supper. ) 30 tablet 5  . witch hazel-glycerin (TUCKS) pad Apply 1 application topically as needed for itching or irritation.     No current facility-administered medications on file prior to visit.      Physical Examination  Vitals:   03/11/17 1219  BP: 117/71  Pulse: (!) 55  Resp: 20  Temp: (!) 97.1 F (36.2 C)  TempSrc: Oral  SpO2: 96%  Weight: 142 lb (64.4 kg)  Height: 5\' 11"  (1.803 m)   Body mass index is 19.8 kg/m.  Vascular: Vessel Right Left  Aorta  Non-palpable N/A  Femoral 2+ Palpable 2+ Palpable  Popliteal  Non-palpable  Non-palpable  PT not Palpable, no Doppler signal 1+ Palpable  DP not Palpable, no Doppler signal 1+ Palpable   Gastrointestinal: soft, NTND, -G/R, - HSM, - palpable masses, - CVAT B Bilateral groins with healing well approximated incision edges, no  erythema, minimal swelling; large surrounding areas of resolving ecchymosis.  Both feet with strong dorsi and plantar flexion, normal sensation to light touch. Toes of right foot with capillary refill <5 seconds, left foot < 3 seconds.    Medical Decision Making  Benjamin Cook is a 71 y.o. male who presents s/p EVAR on 03-03-17.   For moderate pain in right groin: Tramadol 1 tab po q12h prn pain, disp #15, 0 refills.  Follow up as scheduled on 04-04-17 with CTA abd/pelvis, see Dr. Donzetta Matters.  I advised pt and wife  to notify us if he develops concerns related to his surgery.   Clemon Chambers, RN, MSN, FNP-C [facility] Office: [phone]  03/11/2017, 12:28 PM  Clinic MD: Early

## 2017-03-11 NOTE — Patient Instructions (Signed)
Steps to Quit Smoking Smoking tobacco can be bad for your health. It can also affect almost every organ in your body. Smoking puts you and people around you at risk for many serious long-lasting (chronic) diseases. Quitting smoking is hard, but it is one of the best things that you can do for your health. It is never too late to quit. What are the benefits of quitting smoking? When you quit smoking, you lower your risk for getting serious diseases and conditions. They can include:  Lung cancer or lung disease.  Heart disease.  Stroke.  Heart attack.  Not being able to have children (infertility).  Weak bones (osteoporosis) and broken bones (fractures).  If you have coughing, wheezing, and shortness of breath, those symptoms may get better when you quit. You may also get sick less often. If you are pregnant, quitting smoking can help to lower your chances of having a baby of low birth weight. What can I do to help me quit smoking? Talk with your doctor about what can help you quit smoking. Some things you can do (strategies) include:  Quitting smoking totally, instead of slowly cutting back how much you smoke over a period of time.  Going to in-person counseling. You are more likely to quit if you go to many counseling sessions.  Using resources and support systems, such as: ? Online chats with a counselor. ? Phone quitlines. ? Printed self-help materials. ? Support groups or group counseling. ? Text messaging programs. ? Mobile phone apps or applications.  Taking medicines. Some of these medicines may have nicotine in them. If you are pregnant or breastfeeding, do not take any medicines to quit smoking unless your doctor says it is okay. Talk with your doctor about counseling or other things that can help you.  Talk with your doctor about using more than one strategy at the same time, such as taking medicines while you are also going to in-person counseling. This can help make  quitting easier. What things can I do to make it easier to quit? Quitting smoking might feel very hard at first, but there is a lot that you can do to make it easier. Take these steps:  Talk to your family and friends. Ask them to support and encourage you.  Call phone quitlines, reach out to support groups, or work with a counselor.  Ask people who smoke to not smoke around you.  Avoid places that make you want (trigger) to smoke, such as: ? Bars. ? Parties. ? Smoke-break areas at work.  Spend time with people who do not smoke.  Lower the stress in your life. Stress can make you want to smoke. Try these things to help your stress: ? Getting regular exercise. ? Deep-breathing exercises. ? Yoga. ? Meditating. ? Doing a body scan. To do this, close your eyes, focus on one area of your body at a time from head to toe, and notice which parts of your body are tense. Try to relax the muscles in those areas.  Download or buy apps on your mobile phone or tablet that can help you stick to your quit plan. There are many free apps, such as QuitGuide from the CDC (Centers for Disease Control and Prevention). You can find more support from smokefree.gov and other websites.  This information is not intended to replace advice given to you by your health care provider. Make sure you discuss any questions you have with your health care provider. Document Released: 11/17/2008 Document   Revised: 09/19/2015 Document Reviewed: 06/07/2014 Elsevier Interactive Patient Education  2018 Elsevier Inc.  

## 2017-03-24 ENCOUNTER — Ambulatory Visit (INDEPENDENT_AMBULATORY_CARE_PROVIDER_SITE_OTHER): Payer: Medicare Other

## 2017-03-24 DIAGNOSIS — Z95810 Presence of automatic (implantable) cardiac defibrillator: Secondary | ICD-10-CM | POA: Diagnosis not present

## 2017-03-24 DIAGNOSIS — I5022 Chronic systolic (congestive) heart failure: Secondary | ICD-10-CM | POA: Diagnosis not present

## 2017-03-25 NOTE — Progress Notes (Signed)
EPIC Encounter for ICM Monitoring  Patient Name: Benjamin Cook is a 72 y.o. male Date: 03/25/2017 Primary Care Physican: Renato Shin, MD Primary Cardiologist:Crenshaw Electrophysiologist: Allred Dry Weight: 146 lbs        Heart Failure questions reviewed, pt asymptomatic    Thoracic impedance normal.  Prescribed dosage: Furosemide 40 mg0.5tablet (20 mg total)daily. Potassium 20 mEq 1 tablet daily.  Labs: 03/05/2017 Creatinine 0.68, BUN 8, Potassium 3.4,   Sodium 139, EGFR >60 03/04/2017 Creatinine 0.73, BUN 10, Potassium 4.0, Sodium 137, EGFR >60 03/03/2017 Creatinine 0.76, BUN 11, Potassium 4.0, Sodium 138, EGFR >60 02/25/2017 Creatinine 0.86, BUN 5,   Potassium 4.0, Sodium 139, EGFR >60 11/30/2016 Creatinine0.81, BUN<5, Potassium3.8, Sodium140, EGFR>60 11/29/2016 Creatinine0.83, BUN5, Potassium 3.6, Sodium140, EGFR>60  11/28/2016 Creatinine0.82, BUN6, Potassium 3.7, Sodium140, EGFR>60  11/27/2016 Creatinine0.78, BUN8, Potassium 3.1, Sodium139, EGFR>60  11/26/2016 Creatinine0.90, BUN6, Potassium 3.7, Sodium136, EGFR>60  11/25/2016 Creatinine0.79, BUN7, Potassium 3.8, Sodium134, EGFR>60  11/24/2016 Creatinine0.81, BUN7, Potassium 3.9, Sodium135, EGFR>60 11/18/2016 Creatinine0.70, BUN<5, Potassium3.2, Sodium138, EGFR>60 11/17/2016 Creatinine0.68, BUN<5, Potassium3.2, Sodium136, EGFR>60  11/16/2016 Creatinine0.83, BUN7, Potassium 3.4, Sodium136, EGFR>60  11/15/2016 Creatinine0.71, BUN7, Potassium 4.0, Sodium136, EGFR>60 07/17/2016 Creatinine 0.86, BUN 10, Potassium 3.4, Sodium 39, EGFR >60 09/15/2015 Creatinine 0.88, BUN 10, Potassium 4.6, Sodium 142  Recommendations: No changes.   Encouraged to call for fluid symptoms.  Follow-up plan: ICM clinic phone appointment on 04/24/2017.    Copy of ICM check sent to Dr. Rayann Heman.   3 month ICM trend: 03/24/2017    1 Year ICM trend:       [name],  RN 03/25/2017 9:19 AM

## 2017-04-04 ENCOUNTER — Other Ambulatory Visit: Payer: Self-pay

## 2017-04-04 ENCOUNTER — Ambulatory Visit
Admission: RE | Admit: 2017-04-04 | Discharge: 2017-04-04 | Disposition: A | Discharge status: Home / Self Care | Payer: Medicare Other | Source: Ambulatory Visit | Attending: Vascular Surgery | Admitting: Vascular Surgery

## 2017-04-04 ENCOUNTER — Encounter: Payer: Medicare Other | Admitting: Vascular Surgery

## 2017-04-04 ENCOUNTER — Encounter: Payer: Self-pay | Admitting: Vascular Surgery

## 2017-04-04 ENCOUNTER — Ambulatory Visit (INDEPENDENT_AMBULATORY_CARE_PROVIDER_SITE_OTHER): Payer: Medicare Other | Admitting: Vascular Surgery

## 2017-04-04 VITALS — BP 163/82 | HR 72 | Temp 96.8°F | Resp 18 | Ht 71.0 in | Wt 145.0 lb

## 2017-04-04 DIAGNOSIS — Z48812 Encounter for surgical aftercare following surgery on the circulatory system: Secondary | ICD-10-CM

## 2017-04-04 DIAGNOSIS — I714 Abdominal aortic aneurysm, without rupture, unspecified: Secondary | ICD-10-CM

## 2017-04-04 MED ORDER — IOPAMIDOL (ISOVUE-370) INJECTION 76%
75.0000 mL | Freq: Once | INTRAVENOUS | Status: AC | PRN
  Administered 2017-04-04: 75 mL via INTRAVENOUS

## 2017-04-04 NOTE — Progress Notes (Signed)
Subjective:     Patient ID: Benjamin Cook, male   DOB: [birth year], 72 y.o.   MRN: [id number]  HPI 72 year old male follows up after a recent endovascular repair of aneurysm.  He is doing well with no further pain.  States that he does feel somewhat weak but he has been hospitalized several times in the past couple months.  He has no issues related to his surgery today.   Review of Systems fatigue    Objective:   Physical Exam  nad Abdomen is soft, no masses Palpable femoral pulses bileterally Both fee are warm, palpable pt on the left  CTA IMPRESSION: VASCULAR  Status post stent graft repair of the infrarenal abdominal aortic aneurysm involving the iliac bifurcation. Stent graft is patent. Native aneurysm sac appears thrombosed with a stable native aneurysm sac diameter 5.5 cm. No significant endoleak appreciated.  Extensive abdominopelvic atherosclerosis with evidence of a significant ostial celiac stenosis best appreciated on the sagittal images.  NON-VASCULAR  Stable benign right adrenal adenoma/myelolipoma  Resolved pelvic sigmoid diverticulitis compared to the prior study with a residual prominent pelvic sigmoid diverticulum noted.  Marked prostate enlargement     Assessment/plan     72 year old male follows up after aneurysm repair.  He appears to be doing very well aneurysm appears excluded by CT.  He will follow-up in 6 months with aortic aneurysm duplex.  Should he have issues between now and then we can certainly see him sooner.  We will need to keep a watch on his right hypogastric artery as we were unable to coil in at the time of his procedure.        Brandon C. Donzetta Matters, MD [facility] Office: [phone] Pager: [phone]

## 2017-04-04 NOTE — Progress Notes (Signed)
Vitals:   04/04/17 1003  BP: (!) 163/90  Pulse: 70  Resp: 18  Temp: (!) 96.8 F (36 C)  TempSrc: Oral  SpO2: 95%  Weight: 145 lb (65.8 kg)  Height: 5\' 11"  (1.803 m)

## 2017-04-08 NOTE — Addendum Note (Signed)
Addended by: Lianne Cure A on: 04/08/2017 03:44 PM   Modules accepted: Orders

## 2017-04-24 ENCOUNTER — Ambulatory Visit (INDEPENDENT_AMBULATORY_CARE_PROVIDER_SITE_OTHER): Payer: Medicare Other | Admitting: *Deleted

## 2017-04-24 DIAGNOSIS — I5022 Chronic systolic (congestive) heart failure: Secondary | ICD-10-CM

## 2017-04-24 DIAGNOSIS — I255 Ischemic cardiomyopathy: Secondary | ICD-10-CM

## 2017-04-24 DIAGNOSIS — Z95810 Presence of automatic (implantable) cardiac defibrillator: Secondary | ICD-10-CM

## 2017-04-24 NOTE — Progress Notes (Signed)
Remote ICD transmission.   

## 2017-04-24 NOTE — Progress Notes (Signed)
EPIC Encounter for ICM Monitoring  Patient Name: Benjamin Cook is a 72 y.o. male Date: 04/24/2017 Primary Care Physican: Renato Shin, MD Primary Cardiologist:Crenshaw Electrophysiologist: Allred Dry Weight: Previous weight 146 lbs          Spoke with wife due to patient was sleeping. She said he is feeling fine and denied any fluid symptoms.   Thoracic impedance normal.  Prescribed dosage: Furosemide 40 mg0.5tablet (20 mg total)daily. Potassium 20 mEq 1 tablet daily.  Labs: 03/05/2017 Creatinine 0.68, BUN 8, Potassium 3.4, Sodium 139, EGFR >60 03/04/2017 Creatinine 0.73, BUN 10, Potassium 4.0, Sodium 137, EGFR >60 03/03/2017 Creatinine 0.76, BUN 11, Potassium 4.0, Sodium 138, EGFR >60 02/25/2017 Creatinine 0.86, BUN 5, Potassium 4.0, Sodium 139, EGFR >60 11/30/2016 Creatinine0.81, BUN<5, Potassium3.8, Sodium140, EGFR>60 11/29/2016 Creatinine0.83, BUN5, Potassium 3.6, Sodium140, EGFR>60  11/28/2016 Creatinine0.82, BUN6, Potassium 3.7, Sodium140, EGFR>60  11/27/2016 Creatinine0.78, BUN8, Potassium 3.1, Sodium139, EGFR>60  11/26/2016 Creatinine0.90, BUN6, Potassium 3.7, Sodium136, EGFR>60  11/25/2016 Creatinine0.79, BUN7, Potassium 3.8, Sodium134, EGFR>60  11/24/2016 Creatinine0.81, BUN7, Potassium 3.9, Sodium135, EGFR>60 11/18/2016 Creatinine0.70, BUN<5, Potassium3.2, Sodium138, EGFR>60 11/17/2016 Creatinine0.68, BUN<5, Potassium3.2, Sodium136, EGFR>60  11/16/2016 Creatinine0.83, BUN7, Potassium 3.4, Sodium136, EGFR>60  11/15/2016 Creatinine0.71, BUN7, Potassium 4.0, Sodium136, EGFR>60 07/17/2016 Creatinine 0.86, BUN 10, Potassium 3.4, Sodium 39, EGFR >60 09/15/2015 Creatinine 0.88, BUN 10, Potassium 4.6, Sodium 142   Recommendations: No changes.   Encouraged to call for fluid symptoms.  Follow-up plan: ICM clinic phone appointment on 05/26/2017.    Copy of ICM check sent to Dr. Rayann Heman.   3  month ICM trend: 04/24/2017   1 Year ICM trend:       [name], RN 04/24/2017 3:28 PM

## 2017-04-25 ENCOUNTER — Encounter: Payer: Self-pay | Admitting: Cardiology

## 2017-04-27 ENCOUNTER — Other Ambulatory Visit: Payer: Self-pay | Admitting: Internal Medicine

## 2017-04-28 LAB — CUP PACEART REMOTE DEVICE CHECK
Battery Remaining Longevity: 66 mo
Battery Remaining Percentage: 67 %
Battery Voltage: 2.99 V
Brady Statistic AP VP Percent: 1 %
Brady Statistic AP VS Percent: 15 %
Brady Statistic AS VP Percent: 1 %
Brady Statistic AS VS Percent: 81 %
Brady Statistic RA Percent Paced: 9.5 %
Brady Statistic RV Percent Paced: 1 %
Date Time Interrogation Session: 20190321124319
HighPow Impedance: 73 Ohm
HighPow Impedance: 73 Ohm
Implantable Lead Implant Date: 20120420
Implantable Lead Implant Date: 20180613
Implantable Lead Location: 753859
Implantable Lead Location: 753860
Implantable Pulse Generator Implant Date: 20160212
Lead Channel Impedance Value: 350 Ohm
Lead Channel Impedance Value: 790 Ohm
Lead Channel Pacing Threshold Amplitude: 0.5 V
Lead Channel Pacing Threshold Amplitude: 0.5 V
Lead Channel Pacing Threshold Pulse Width: 0.5 ms
Lead Channel Pacing Threshold Pulse Width: 0.5 ms
Lead Channel Sensing Intrinsic Amplitude: 12 mV
Lead Channel Sensing Intrinsic Amplitude: 2.8 mV
Lead Channel Setting Pacing Amplitude: 2 V
Lead Channel Setting Pacing Amplitude: 2.5 V
Lead Channel Setting Pacing Pulse Width: 0.5 ms
Lead Channel Setting Sensing Sensitivity: 0.5 mV
Pulse Gen Serial Number: 7225079

## 2017-05-26 ENCOUNTER — Ambulatory Visit (INDEPENDENT_AMBULATORY_CARE_PROVIDER_SITE_OTHER): Payer: Medicare Other

## 2017-05-26 DIAGNOSIS — I5022 Chronic systolic (congestive) heart failure: Secondary | ICD-10-CM | POA: Diagnosis not present

## 2017-05-26 DIAGNOSIS — Z95810 Presence of automatic (implantable) cardiac defibrillator: Secondary | ICD-10-CM

## 2017-05-26 NOTE — Progress Notes (Signed)
EPIC Encounter for ICM Monitoring  Patient Name: Benjamin Cook is a 72 y.o. male Date: 05/26/2017 Primary Care Physican: Renato Shin, MD Primary Cardiologist:Crenshaw Electrophysiologist: Allred Dry Weight: Previous weight 146 lbs       Attempted call to patient and unable to reach.   Transmission reviewed.    Thoracic impedance normal.  Prescribed dosage: Furosemide 40 mg0.5tablet (20 mg total)daily. Potassium 20 mEq 1 tablet daily.  Labs: 03/05/2017 Creatinine 0.68, BUN 8, Potassium 3.4, Sodium 139, EGFR >60 03/04/2017 Creatinine 0.73, BUN 10, Potassium 4.0, Sodium 137, EGFR >60 03/03/2017 Creatinine 0.76, BUN 11, Potassium 4.0, Sodium 138, EGFR >60 02/25/2017 Creatinine 0.86, BUN 5, Potassium 4.0, Sodium 139, EGFR >60 11/30/2016 Creatinine0.81, BUN<5, Potassium3.8, Sodium140, EGFR>60 11/29/2016 Creatinine0.83, BUN5, Potassium 3.6, Sodium140, EGFR>60  11/28/2016 Creatinine0.82, BUN6, Potassium 3.7, Sodium140, EGFR>60  11/27/2016 Creatinine0.78, BUN8, Potassium 3.1, Sodium139, EGFR>60  11/26/2016 Creatinine0.90, BUN6, Potassium 3.7, Sodium136, EGFR>60  11/25/2016 Creatinine0.79, BUN7, Potassium 3.8, Sodium134, EGFR>60  11/24/2016 Creatinine0.81, BUN7, Potassium 3.9, Sodium135, EGFR>60 11/18/2016 Creatinine0.70, BUN<5, Potassium3.2, Sodium138, EGFR>60 11/17/2016 Creatinine0.68, BUN<5, Potassium3.2, Sodium136, EGFR>60  11/16/2016 Creatinine0.83, BUN7, Potassium 3.4, Sodium136, EGFR>60  11/15/2016 Creatinine0.71, BUN7, Potassium 4.0, Sodium136, EGFR>60 07/17/2016 Creatinine 0.86, BUN 10, Potassium 3.4, Sodium 39, EGFR >60  Recommendations: NONE - Unable to reach.  Follow-up plan: ICM clinic phone appointment on 06/26/2017.   Copy of ICM check sent to Dr. Rayann Heman.   3 month ICM trend: 05/26/2017    1 Year ICM trend:       [name], RN 05/26/2017 2:58 PM

## 2017-05-27 ENCOUNTER — Telehealth: Payer: Self-pay

## 2017-05-27 NOTE — Telephone Encounter (Signed)
Remote ICM transmission received.  Attempted call to patient and no answer or answering machine.  

## 2017-06-24 ENCOUNTER — Ambulatory Visit: Payer: Medicare Other | Admitting: Endocrinology

## 2017-06-26 ENCOUNTER — Ambulatory Visit (INDEPENDENT_AMBULATORY_CARE_PROVIDER_SITE_OTHER): Payer: Medicare Other

## 2017-06-26 DIAGNOSIS — Z95810 Presence of automatic (implantable) cardiac defibrillator: Secondary | ICD-10-CM | POA: Diagnosis not present

## 2017-06-26 DIAGNOSIS — I5022 Chronic systolic (congestive) heart failure: Secondary | ICD-10-CM | POA: Diagnosis not present

## 2017-06-27 NOTE — Progress Notes (Signed)
EPIC Encounter for ICM Monitoring  Patient Name: Benjamin Cook is a 72 y.o. male Date: 06/27/2017 Primary Care Physican: Renato Shin, MD Primary Cardiologist:Crenshaw Electrophysiologist: Allred Dry Weight: Previous [id number] lbs      Spoke with wife, patient sleeping. Heart Failure questions reviewed, pt asymptomatic.    Thoracic impedance normal but was abnormal suggesting fluid accumulation from 06/13/2017 - 06/22/2017.  Prescribed dosage: Furosemide 40 mg0.5tablet (20 mg total)daily. Potassium 20 mEq 1 tablet daily.  Labs: 03/05/2017 Creatinine 0.68, BUN 8, Potassium 3.4, Sodium 139, EGFR >60 03/04/2017 Creatinine 0.73, BUN 10, Potassium 4.0, Sodium 137, EGFR >60 03/03/2017 Creatinine 0.76, BUN 11, Potassium 4.0, Sodium 138, EGFR >60 02/25/2017 Creatinine 0.86, BUN 5, Potassium 4.0, Sodium 139, EGFR >60 11/30/2016 Creatinine0.81, BUN<5, Potassium3.8, Sodium140, EGFR>60 11/29/2016 Creatinine0.83, BUN5, Potassium 3.6, Sodium140, EGFR>60  11/28/2016 Creatinine0.82, BUN6, Potassium 3.7, Sodium140, EGFR>60  11/27/2016 Creatinine0.78, BUN8, Potassium 3.1, Sodium139, EGFR>60  11/26/2016 Creatinine0.90, BUN6, Potassium 3.7, Sodium136, EGFR>60  11/25/2016 Creatinine0.79, BUN7, Potassium 3.8, Sodium134, EGFR>60  11/24/2016 Creatinine0.81, BUN7, Potassium 3.9, Sodium135, EGFR>60 11/18/2016 Creatinine0.70, BUN<5, Potassium3.2, Sodium138, EGFR>60 11/17/2016 Creatinine0.68, BUN<5, Potassium3.2, Sodium136, EGFR>60  11/16/2016 Creatinine0.83, BUN7, Potassium 3.4, Sodium136, EGFR>60  11/15/2016 Creatinine0.71, BUN7, Potassium 4.0, Sodium136, EGFR>60  Recommendations: No changes.  Encouraged to call for fluid symptoms.  Follow-up plan: ICM clinic phone appointment on 07/28/2017.    Copy of ICM check sent to Dr. Rayann Heman.   3 month ICM trend: 06/26/2017    1 Year ICM trend:       [name],  RN 06/27/2017 9:50 AM

## 2017-07-28 ENCOUNTER — Ambulatory Visit (INDEPENDENT_AMBULATORY_CARE_PROVIDER_SITE_OTHER): Payer: Medicare Other | Admitting: *Deleted

## 2017-07-28 ENCOUNTER — Ambulatory Visit (INDEPENDENT_AMBULATORY_CARE_PROVIDER_SITE_OTHER): Payer: Medicare Other

## 2017-07-28 ENCOUNTER — Telehealth: Payer: Self-pay

## 2017-07-28 DIAGNOSIS — I255 Ischemic cardiomyopathy: Secondary | ICD-10-CM

## 2017-07-28 DIAGNOSIS — I5022 Chronic systolic (congestive) heart failure: Secondary | ICD-10-CM | POA: Diagnosis not present

## 2017-07-28 DIAGNOSIS — Z95810 Presence of automatic (implantable) cardiac defibrillator: Secondary | ICD-10-CM | POA: Diagnosis not present

## 2017-07-28 NOTE — Telephone Encounter (Signed)
Confirmed remote transmission w/ pt wife.   

## 2017-07-29 ENCOUNTER — Encounter: Payer: Self-pay | Admitting: Cardiology

## 2017-07-29 ENCOUNTER — Telehealth: Payer: Self-pay | Admitting: Endocrinology

## 2017-07-29 NOTE — Telephone Encounter (Signed)
Need a refill of Fluticasone-Salmeterol (ADVAIR) 100-50 MCG/DOSE AEPB [[id number]]   And he also need a prescription for a rescue inhailer   Send to  Pharmacy:  [facility], [place]

## 2017-07-29 NOTE — Progress Notes (Signed)
EPIC Encounter for ICM Monitoring  Patient Name: Benjamin Cook is a 72 y.o. male Date: 07/29/2017 Primary Care Physican: Renato Shin, MD Primary Cardiologist:Crenshaw Electrophysiologist: Allred Dry Weight: Previous [id number] lbs        Spoke with wife due to patient was sleeping.  Heart Failure questions reviewed, pt asymptomatic.   Thoracic impedance normal.  Prescribed dosage: Furosemide 40 mg0.5tablet (20 mg total)daily. Potassium 20 mEq 1 tablet daily.  Labs: 03/05/2017 Creatinine 0.68, BUN 8, Potassium 3.4, Sodium 139, EGFR >60 03/04/2017 Creatinine 0.73, BUN 10, Potassium 4.0, Sodium 137, EGFR >60 03/03/2017 Creatinine 0.76, BUN 11, Potassium 4.0, Sodium 138, EGFR >60 02/25/2017 Creatinine 0.86, BUN 5, Potassium 4.0, Sodium 139, EGFR >60 11/30/2016 Creatinine0.81, BUN<5, Potassium3.8, Sodium140, EGFR>60 11/29/2016 Creatinine0.83, BUN5, Potassium 3.6, Sodium140, EGFR>60  11/28/2016 Creatinine0.82, BUN6, Potassium 3.7, Sodium140, EGFR>60  11/27/2016 Creatinine0.78, BUN8, Potassium 3.1, Sodium139, EGFR>60  11/26/2016 Creatinine0.90, BUN6, Potassium 3.7, Sodium136, EGFR>60  11/25/2016 Creatinine0.79, BUN7, Potassium 3.8, Sodium134, EGFR>60  11/24/2016 Creatinine0.81, BUN7, Potassium 3.9, Sodium135, EGFR>60 11/18/2016 Creatinine0.70, BUN<5, Potassium3.2, Sodium138, EGFR>60 11/17/2016 Creatinine0.68, BUN<5, Potassium3.2, Sodium136, EGFR>60  11/16/2016 Creatinine0.83, BUN7, Potassium 3.4, Sodium136, EGFR>60  11/15/2016 Creatinine0.71, BUN7, Potassium 4.0, Sodium136, EGFR>60  Recommendations: No changes.   Encouraged to call for fluid symptoms.  Follow-up plan: ICM clinic phone appointment on 09/02/2017.    Copy of ICM check sent to Dr. Rayann Heman.   3 month ICM trend: 07/28/2017    1 Year ICM trend:       [name], RN 07/29/2017 8:55 AM

## 2017-07-29 NOTE — Progress Notes (Signed)
Remote ICD transmission.   

## 2017-07-30 ENCOUNTER — Other Ambulatory Visit: Payer: Self-pay

## 2017-07-30 MED ORDER — FLUTICASONE-SALMETEROL 100-50 MCG/DOSE IN AEPB: 1.0000 | INHALATION_SPRAY | Freq: Two times a day (BID) | RESPIRATORY_TRACT | 3 refills | Status: DC

## 2017-07-30 NOTE — Telephone Encounter (Signed)
I have sent to patient's requested pharmacy.  

## 2017-08-01 LAB — CUP PACEART REMOTE DEVICE CHECK
Battery Remaining Longevity: 64 mo
Battery Remaining Percentage: 64 %
Brady Statistic AP VS Percent: 16 %
Brady Statistic AS VS Percent: 81 %
Brady Statistic RV Percent Paced: 1 %
HIGH POWER IMPEDANCE MEASURED VALUE: 79 Ohm
HIGH POWER IMPEDANCE MEASURED VALUE: 79 Ohm
Implantable Lead Location: 753859
Implantable Lead Location: 753860
Implantable Pulse Generator Implant Date: 20160212
Lead Channel Impedance Value: 350 Ohm
Lead Channel Impedance Value: 850 Ohm
Lead Channel Pacing Threshold Amplitude: 0.5 V
Lead Channel Pacing Threshold Pulse Width: 0.5 ms
Lead Channel Sensing Intrinsic Amplitude: 11.6 mV
Lead Channel Sensing Intrinsic Amplitude: 2.6 mV
Lead Channel Setting Pacing Amplitude: 2 V
Lead Channel Setting Pacing Amplitude: 2.5 V
Lead Channel Setting Pacing Pulse Width: 0.5 ms
MDC IDC LEAD IMPLANT DT: 20120420
MDC IDC LEAD IMPLANT DT: 20180613
MDC IDC MSMT BATTERY VOLTAGE: 2.98 V
MDC IDC MSMT LEADCHNL RA PACING THRESHOLD AMPLITUDE: 0.5 V
MDC IDC MSMT LEADCHNL RA PACING THRESHOLD PULSEWIDTH: 0.5 ms
MDC IDC SESS DTM: 20190624211227
MDC IDC SET LEADCHNL RV SENSING SENSITIVITY: 0.5 mV
MDC IDC STAT BRADY AP VP PERCENT: 1 %
MDC IDC STAT BRADY AS VP PERCENT: 1 %
MDC IDC STAT BRADY RA PERCENT PACED: 10 %
Pulse Gen Serial Number: 7225079

## 2017-08-08 ENCOUNTER — Other Ambulatory Visit (INDEPENDENT_AMBULATORY_CARE_PROVIDER_SITE_OTHER): Payer: Medicare Other

## 2017-08-08 ENCOUNTER — Encounter: Payer: Self-pay | Admitting: Endocrinology

## 2017-08-08 ENCOUNTER — Ambulatory Visit (INDEPENDENT_AMBULATORY_CARE_PROVIDER_SITE_OTHER): Payer: Medicare Other | Admitting: Endocrinology

## 2017-08-08 VITALS — BP 132/70 | HR 64 | Wt 149.0 lb

## 2017-08-08 DIAGNOSIS — R7309 Other abnormal glucose: Secondary | ICD-10-CM | POA: Diagnosis not present

## 2017-08-08 DIAGNOSIS — Z119 Encounter for screening for infectious and parasitic diseases, unspecified: Secondary | ICD-10-CM

## 2017-08-08 DIAGNOSIS — D509 Iron deficiency anemia, unspecified: Secondary | ICD-10-CM | POA: Insufficient documentation

## 2017-08-08 DIAGNOSIS — D649 Anemia, unspecified: Secondary | ICD-10-CM

## 2017-08-08 DIAGNOSIS — Z7189 Other specified counseling: Secondary | ICD-10-CM | POA: Insufficient documentation

## 2017-08-08 DIAGNOSIS — Z23 Encounter for immunization: Secondary | ICD-10-CM | POA: Diagnosis not present

## 2017-08-08 LAB — CBC WITH DIFFERENTIAL/PLATELET
Basophils Absolute: 0.1 10*3/uL (ref 0.0–0.1)
Basophils Relative: 0.7 % (ref 0.0–3.0)
EOS PCT: 2.9 % (ref 0.0–5.0)
Eosinophils Absolute: 0.2 10*3/uL (ref 0.0–0.7)
HEMATOCRIT: 43.1 % (ref 39.0–52.0)
Hemoglobin: 14.5 g/dL (ref 13.0–17.0)
LYMPHS ABS: 1.5 10*3/uL (ref 0.7–4.0)
LYMPHS PCT: 20.6 % (ref 12.0–46.0)
MCHC: 33.6 g/dL (ref 30.0–36.0)
MCV: 91.7 fl (ref 78.0–100.0)
MONOS PCT: 10.7 % (ref 3.0–12.0)
Monocytes Absolute: 0.8 10*3/uL (ref 0.1–1.0)
Neutro Abs: 4.7 10*3/uL (ref 1.4–7.7)
Neutrophils Relative %: 65.1 % (ref 43.0–77.0)
Platelets: 191 10*3/uL (ref 150.0–400.0)
RBC: 4.69 Mil/uL (ref 4.22–5.81)
RDW: 15.4 % (ref 11.5–15.5)
WBC: 7.2 10*3/uL (ref 4.0–10.5)

## 2017-08-08 LAB — IBC PANEL
Iron: 54 ug/dL (ref 42–165)
SATURATION RATIOS: 13.9 % — AB (ref 20.0–50.0)
Transferrin: 278 mg/dL (ref 212.0–360.0)

## 2017-08-08 LAB — HEMOGLOBIN A1C: Hgb A1c MFr Bld: 6 % (ref 4.6–6.5)

## 2017-08-08 LAB — VITAMIN D 25 HYDROXY (VIT D DEFICIENCY, FRACTURES): VITD: 35.68 ng/mL (ref 30.00–100.00)

## 2017-08-08 NOTE — Patient Instructions (Addendum)
blood tests are requested for you today.  We'll let you know about the results. Please come back for a regular physical appointment in 4 months (after 12/24/17)

## 2017-08-08 NOTE — Progress Notes (Signed)
Subjective:    Patient ID: Benjamin Cook, male    DOB: 05/12/1945, 72 y.o.   MRN: [id number]  HPI  Hypocalcemia: was noted early 2019.  He has slight cramps of the legs, but no assoc numbness Past Medical History:  Diagnosis Date  . Adrenal mass ([place])    per pt this is remote (10 years) and benign by biopsy  . AICD (automatic cardioverter/defibrillator) present   . Anxiety   . Arthritis   . CAD (coronary artery disease)    a. s/p PCI to LAD 2001 b. myoview 2014 high risk with scar LAD/RCA territory but no ischemia  . Cardiomyopathy, ischemic   . CHF (congestive heart failure) ([name])    class II/III  . COPD (chronic obstructive pulmonary disease) ([name])    smokes cigars but has quit cigarettes  . Dyspnea   . Flu 03/2015  . HTN (hypertension)   . Hyperlipidemia   . NSVT (nonsustained ventricular tachycardia) ([place]) 03/05/2017  . Paroxysmal atrial fibrillation ([name]) 03/2015   chads2vasc score is 4  . PSA (psoriatic arthritis) ([place])    increased  . PVD (peripheral vascular disease) ([place])     Past Surgical History:  Procedure Laterality Date  . ABDOMINAL AORTIC ENDOVASCULAR STENT GRAFT N/A 03/03/2017   Procedure: ABDOMINAL AORTIC ENDOVASCULAR STENT GRAFT;  Surgeon: Waynetta Sandy, MD;  Location: [facility];  Service: Vascular;  Laterality: N/A;  . cataract surgery    . CORONARY ANGIOPLASTY WITH STENT PLACEMENT  2001   a. PCI to LAD  . IMPLANTABLE CARDIOVERTER DEFIBRILLATOR (ICD) GENERATOR CHANGE N/A 03/18/2014   a. SJM Fortify ST DR ICD implanted by [facility] for primary prevention b. gen change 03/2014   . LEAD REVISION/REPAIR N/A 07/17/2016   Procedure: Lead Revision/Repair;  Surgeon: Evans Lance, MD;  Location: [facility];  Service: Cardiovascular;  Laterality: N/A;  . LEFT HEART CATHETERIZATION WITH CORONARY ANGIOGRAM N/A 05/27/2014   Procedure: LEFT HEART CATHETERIZATION WITH CORONARY ANGIOGRAM;  Surgeon: Sherren Mocha, MD;  Location: [facility] CATH LAB;  Service:  Cardiovascular;  Laterality: N/A;    Social History   Socioeconomic History  . Marital status: Married    Spouse name: Not on file  . Number of children: Not on file  . Years of education: Not on file  . Highest education level: Not on file  Occupational History  . Occupation: Teacher, adult education care  Social Needs  . Financial resource strain: Not on file  . Food insecurity:    Worry: Not on file    Inability: Not on file  . Transportation needs:    Medical: Not on file    Non-medical: Not on file  Tobacco Use  . Smoking status: Current Every Day Smoker    Types: Cigars  . Smokeless tobacco: Never Used  . Tobacco comment: smokes cigars now, no longer smokes cigarettes but previously smoked 2ppd  Substance and Sexual Activity  . Alcohol use: No  . Drug use: No  . Sexual activity: Not Currently    Partners: Female    Birth control/protection: None  Lifestyle  . Physical activity:    Days per week: Not on file    Minutes per session: Not on file  . Stress: Not on file  Relationships  . Social connections:    Talks on phone: Not on file    Gets together: Not on file    Attends religious service: Not on file    Active member of club or organization: Not on  file    Attends meetings of clubs or organizations: Not on file    Relationship status: Not on file  . Intimate partner violence:    Fear of current or ex partner: Not on file    Emotionally abused: Not on file    Physically abused: Not on file    Forced sexual activity: Not on file  Other Topics Concern  . Not on file  Social History Narrative  . Not on file    Current Outpatient Medications on File Prior to Visit  Medication Sig Dispense Refill  . acetaminophen (TYLENOL) 500 MG tablet Take 1,000 mg by mouth daily as needed (back pain).    Marland Kitchen atorvastatin (LIPITOR) 80 MG tablet TAKE 1 TABLET BY MOUTH DAILY AT 6 PM. 90 tablet 2  . carvedilol (COREG) 25 MG tablet Take 1 tablet (25 mg total) by mouth 2 (two) times daily with a  meal. 180 tablet 3  . Fluticasone-Salmeterol (ADVAIR) 100-50 MCG/DOSE AEPB Inhale 1 puff into the lungs 2 (two) times daily. 1 each 3  . furosemide (LASIX) 40 MG tablet Take 0.5 tablets (20 mg total) by mouth daily. 90 tablet 3  . lisinopril (PRINIVIL,ZESTRIL) 20 MG tablet Take 40 mg by mouth daily.    Marland Kitchen lisinopril (PRINIVIL,ZESTRIL) 40 MG tablet Take 1 tablet (40 mg total) by mouth daily. 30 tablet 11  . liver oil-zinc oxide (DESITIN) 40 % ointment Apply 1 application topically as needed for irritation.    . Multiple Vitamin (MULTIVITAMIN) capsule Take 1 capsule by mouth daily.      . Omega-3 Fatty Acids (FISH OIL) 1200 MG CAPS Take 1,200 mg by mouth daily.    . potassium chloride SA (K-DUR,KLOR-CON) 20 MEQ tablet Take 1 tablet (20 mEq total) by mouth daily. 90 tablet 2  . witch hazel-glycerin (TUCKS) pad Apply 1 application topically as needed for itching or irritation.    Alveda Reasons 20 MG TABS tablet TAKE 1 TABLET (20 MG TOTAL) BY MOUTH DAILY WITH SUPPER. 90 tablet 1  . pantoprazole (PROTONIX) 40 MG tablet TAKE 1 TABLET BY MOUTH EVERYDAY AT BEDTIME (Patient not taking: Reported on 08/08/2017) 30 tablet 0   No current facility-administered medications on file prior to visit.     No Known Allergies  Family History  Problem Relation Age of Onset  . Cirrhosis Mother        due to ETOH  . Cancer Neg Hx     BP 132/70 (BP Location: Left Arm, Patient Position: Sitting, Cuff Size: Normal)   Pulse 64   Wt 149 lb (67.6 kg)   SpO2 93%   BMI 20.78 kg/m    Review of Systems Denies weight change and BRBPR    Objective:   Physical Exam VITAL SIGNS:  See vs page GENERAL: no distress Gait: normal and steady.       Assessment & Plan:  Hyperglycemia: recheck today Hypocalcemia: recheck today Anemia: recheck today  Patient Instructions  blood tests are requested for you today.  We'll let you know about the results. Please come back for a regular physical appointment in 4 months (after  12/24/17)

## 2017-08-11 LAB — PTH, INTACT AND CALCIUM
Calcium: 9.4 mg/dL (ref 8.6–10.3)
PTH: 45 pg/mL (ref 14–64)

## 2017-08-11 LAB — HEPATITIS C ANTIBODY
HEP C AB: NONREACTIVE
SIGNAL TO CUT-OFF: 0.06 (ref <1.00)

## 2017-08-25 ENCOUNTER — Other Ambulatory Visit: Payer: Self-pay

## 2017-08-28 ENCOUNTER — Telehealth: Payer: Self-pay | Admitting: Cardiology

## 2017-08-28 NOTE — Telephone Encounter (Signed)
Medication samples have been provided to the patient.  Drug name: Xarelto 20 mg   Qty: 21 tablets   LOT: [id number]  Exp.Date: 06/21  Samples left at front desk for patient pick-up. Patient wife notified.

## 2017-08-28 NOTE — Telephone Encounter (Signed)
New message   Patient calling the office for samples of medication:   1.  What medication and dosage are you requesting samples for? XARELTO 20 MG TABS tablet  2.  Are you currently out of this medication? No

## 2017-09-02 ENCOUNTER — Ambulatory Visit (INDEPENDENT_AMBULATORY_CARE_PROVIDER_SITE_OTHER): Payer: Medicare Other

## 2017-09-02 DIAGNOSIS — Z95810 Presence of automatic (implantable) cardiac defibrillator: Secondary | ICD-10-CM | POA: Diagnosis not present

## 2017-09-02 DIAGNOSIS — I5022 Chronic systolic (congestive) heart failure: Secondary | ICD-10-CM | POA: Diagnosis not present

## 2017-09-02 NOTE — Progress Notes (Signed)
EPIC Encounter for ICM Monitoring  Patient Name: Benjamin Cook is a 72 y.o. male Date: 09/02/2017 Primary Care Physican: Renato Shin, MD Primary Cardiologist:Crenshaw Electrophysiologist: Allred Dry Weight:146 lbs      Spoke with wife due to patient was sleeping. Heart Failure questions reviewed, pt asymptomatic and said he is doing very well.    Thoracic impedance normal.  Prescribed dosage: Furosemide 40 mg0.5tablet (20 mg total)daily. Potassium 20 mEq 1 tablet daily.  Labs: 03/05/2017 Creatinine 0.68, BUN 8, Potassium 3.4, Sodium 139, EGFR >60 03/04/2017 Creatinine 0.73, BUN 10, Potassium 4.0, Sodium 137, EGFR >60 03/03/2017 Creatinine 0.76, BUN 11, Potassium 4.0, Sodium 138, EGFR >60 02/25/2017 Creatinine 0.86, BUN 5, Potassium 4.0, Sodium 139, EGFR >60 11/30/2016 Creatinine0.81, BUN<5, Potassium3.8, Sodium140, EGFR>60 11/29/2016 Creatinine0.83, BUN5, Potassium 3.6, Sodium140, EGFR>60  11/28/2016 Creatinine0.82, BUN6, Potassium 3.7, Sodium140, EGFR>60  11/27/2016 Creatinine0.78, BUN8, Potassium 3.1, Sodium139, EGFR>60  11/26/2016 Creatinine0.90, BUN6, Potassium 3.7, Sodium136, EGFR>60  11/25/2016 Creatinine0.79, BUN7, Potassium 3.8, Sodium134, EGFR>60  11/24/2016 Creatinine0.81, BUN7, Potassium 3.9, Sodium135, EGFR>60 11/18/2016 Creatinine0.70, BUN<5, Potassium3.2, Sodium138, EGFR>60 11/17/2016 Creatinine0.68, BUN<5, Potassium3.2, Sodium136, EGFR>60  11/16/2016 Creatinine0.83, BUN7, Potassium 3.4, Sodium136, EGFR>60  11/15/2016 Creatinine0.71, BUN7, Potassium 4.0, Sodium136, EGFR>60  Recommendations:   No changes.   Encouraged to call for fluid symptoms.  Follow-up plan: ICM clinic phone appointment on 10/03/2017.      Copy of ICM check sent to Dr. Rayann Heman.   3 month ICM trend: 09/02/2017    1 Year ICM trend:       [name], RN 09/02/2017 12:21 PM

## 2017-09-08 ENCOUNTER — Encounter: Payer: Self-pay | Admitting: Endocrinology

## 2017-09-08 DIAGNOSIS — Z1212 Encounter for screening for malignant neoplasm of rectum: Secondary | ICD-10-CM | POA: Diagnosis not present

## 2017-09-08 DIAGNOSIS — Z1211 Encounter for screening for malignant neoplasm of colon: Secondary | ICD-10-CM | POA: Diagnosis not present

## 2017-09-08 LAB — COLOGUARD

## 2017-09-23 NOTE — Progress Notes (Signed)
ICM remote transmission rescheduled to 10/08/7167 due to conflict in schedule on 6/78/9381.

## 2017-10-03 ENCOUNTER — Telehealth: Payer: Self-pay

## 2017-10-03 NOTE — Telephone Encounter (Signed)
Late entry... Pt walked in and given 2 week supply of his Xarelto 20mg  and I offered him some information for patient assistance and he was very appreciative and will let us know if he needs any assistance from [place] after he reviews it.

## 2017-10-04 LAB — CUP PACEART REMOTE DEVICE CHECK
Battery Remaining Percentage: 63 %
Battery Voltage: 2.99 V
Brady Statistic AP VS Percent: 16 %
Brady Statistic RA Percent Paced: 11 %
Brady Statistic RV Percent Paced: 1 %
HIGH POWER IMPEDANCE MEASURED VALUE: 71 Ohm
HighPow Impedance: 71 Ohm
Implantable Lead Implant Date: 20180613
Implantable Lead Location: 753859
Implantable Pulse Generator Implant Date: 20160212
Lead Channel Pacing Threshold Amplitude: 0.5 V
Lead Channel Pacing Threshold Pulse Width: 0.5 ms
Lead Channel Pacing Threshold Pulse Width: 0.5 ms
Lead Channel Sensing Intrinsic Amplitude: 11.6 mV
Lead Channel Sensing Intrinsic Amplitude: 3.3 mV
Lead Channel Setting Pacing Amplitude: 2 V
Lead Channel Setting Pacing Amplitude: 2.5 V
Lead Channel Setting Pacing Pulse Width: 0.5 ms
MDC IDC LEAD IMPLANT DT: 20120420
MDC IDC LEAD LOCATION: 753860
MDC IDC MSMT BATTERY REMAINING LONGEVITY: 62 mo
MDC IDC MSMT LEADCHNL RA IMPEDANCE VALUE: 350 Ohm
MDC IDC MSMT LEADCHNL RA PACING THRESHOLD AMPLITUDE: 0.5 V
MDC IDC MSMT LEADCHNL RV IMPEDANCE VALUE: 780 Ohm
MDC IDC SESS DTM: 20190730112850
MDC IDC SET LEADCHNL RV SENSING SENSITIVITY: 0.5 mV
MDC IDC STAT BRADY AP VP PERCENT: 1 %
MDC IDC STAT BRADY AS VP PERCENT: 1 %
MDC IDC STAT BRADY AS VS PERCENT: 81 %
Pulse Gen Serial Number: 7225079

## 2017-10-10 ENCOUNTER — Ambulatory Visit (HOSPITAL_COMMUNITY)
Admission: RE | Admit: 2017-10-10 | Discharge: 2017-10-10 | Disposition: A | Discharge status: Home / Self Care | Payer: Medicare Other | Source: Ambulatory Visit | Attending: Vascular Surgery | Admitting: Vascular Surgery

## 2017-10-10 ENCOUNTER — Other Ambulatory Visit: Payer: Self-pay

## 2017-10-10 ENCOUNTER — Encounter: Payer: Self-pay | Admitting: Vascular Surgery

## 2017-10-10 ENCOUNTER — Ambulatory Visit: Payer: Medicare Other | Admitting: Vascular Surgery

## 2017-10-10 VITALS — BP 140/82 | HR 62 | Resp 20 | Ht 71.0 in | Wt 149.0 lb

## 2017-10-10 DIAGNOSIS — I10 Essential (primary) hypertension: Secondary | ICD-10-CM | POA: Insufficient documentation

## 2017-10-10 DIAGNOSIS — I714 Abdominal aortic aneurysm, without rupture, unspecified: Secondary | ICD-10-CM

## 2017-10-10 DIAGNOSIS — F17200 Nicotine dependence, unspecified, uncomplicated: Secondary | ICD-10-CM | POA: Diagnosis not present

## 2017-10-10 DIAGNOSIS — Z95828 Presence of other vascular implants and grafts: Secondary | ICD-10-CM | POA: Diagnosis not present

## 2017-10-10 DIAGNOSIS — Z9889 Other specified postprocedural states: Secondary | ICD-10-CM | POA: Diagnosis not present

## 2017-10-10 NOTE — Progress Notes (Signed)
Patient ID: Benjamin Cook, male   DOB: 1945/12/15, 72 y.o.   MRN: [id number]  Reason for Consult: Follow-up ( 6 month f/u )   Referred by Renato Shin, MD  Subjective:     HPI:  Benjamin Cook is a 72 y.o. male previously underwent endovascular aneurysm repair.  He is done very well since his hospitalization.  His fatigue is improved.  He currently has no issues no complaints related to today's visit.  No new back or abdominal pain.  Past Medical History:  Diagnosis Date  . Adrenal mass ([place])    per pt this is remote (10 years) and benign by biopsy  . AICD (automatic cardioverter/defibrillator) present   . Anxiety   . Arthritis   . CAD (coronary artery disease)    a. s/p PCI to LAD 2001 b. myoview 2014 high risk with scar LAD/RCA territory but no ischemia  . Cardiomyopathy, ischemic   . CHF (congestive heart failure) ([name])    class II/III  . COPD (chronic obstructive pulmonary disease) ([name])    smokes cigars but has quit cigarettes  . Dyspnea   . Flu 03/2015  . HTN (hypertension)   . Hyperlipidemia   . NSVT (nonsustained ventricular tachycardia) ([place]) 03/05/2017  . Paroxysmal atrial fibrillation ([name]) 03/2015   chads2vasc score is 4  . PSA (psoriatic arthritis) ([place])    increased  . PVD (peripheral vascular disease) ([name])    Family History  Problem Relation Age of Onset  . Cirrhosis Mother        due to ETOH  . Cancer Neg Hx    Past Surgical History:  Procedure Laterality Date  . ABDOMINAL AORTIC ENDOVASCULAR STENT GRAFT N/A 03/03/2017   Procedure: ABDOMINAL AORTIC ENDOVASCULAR STENT GRAFT;  Surgeon: Waynetta Sandy, MD;  Location: [facility];  Service: Vascular;  Laterality: N/A;  . cataract surgery    . CORONARY ANGIOPLASTY WITH STENT PLACEMENT  2001   a. PCI to LAD  . IMPLANTABLE CARDIOVERTER DEFIBRILLATOR (ICD) GENERATOR CHANGE N/A 03/18/2014   a. SJM Fortify ST DR ICD implanted by [facility] for primary prevention b. gen change 03/2014   . LEAD  REVISION/REPAIR N/A 07/17/2016   Procedure: Lead Revision/Repair;  Surgeon: Evans Lance, MD;  Location: [facility] CV LAB;  Service: Cardiovascular;  Laterality: N/A;  . LEFT HEART CATHETERIZATION WITH CORONARY ANGIOGRAM N/A 05/27/2014   Procedure: LEFT HEART CATHETERIZATION WITH CORONARY ANGIOGRAM;  Surgeon: Sherren Mocha, MD;  Location: [facility] CATH LAB;  Service: Cardiovascular;  Laterality: N/A;    Short Social History:  Social History   Tobacco Use  . Smoking status: Current Every Day Smoker    Types: Cigars  . Smokeless tobacco: Never Used  . Tobacco comment: smokes cigars now, no longer smokes cigarettes but previously smoked 2ppd  Substance Use Topics  . Alcohol use: No    No Known Allergies  Current Outpatient Medications  Medication Sig Dispense Refill  . acetaminophen (TYLENOL) 500 MG tablet Take 1,000 mg by mouth daily as needed (back pain).    Marland Kitchen atorvastatin (LIPITOR) 80 MG tablet TAKE 1 TABLET BY MOUTH DAILY AT 6 PM. 90 tablet 2  . carvedilol (COREG) 25 MG tablet Take 1 tablet (25 mg total) by mouth 2 (two) times daily with a meal. 180 tablet 3  . Fluticasone-Salmeterol (ADVAIR) 100-50 MCG/DOSE AEPB Inhale 1 puff into the lungs 2 (two) times daily. 1 each 3  . furosemide (LASIX) 40 MG tablet Take 0.5 tablets (20 mg total)  by mouth daily. 90 tablet 3  . lisinopril (PRINIVIL,ZESTRIL) 20 MG tablet Take 40 mg by mouth daily.    Marland Kitchen lisinopril (PRINIVIL,ZESTRIL) 40 MG tablet Take 1 tablet (40 mg total) by mouth daily. 30 tablet 11  . liver oil-zinc oxide (DESITIN) 40 % ointment Apply 1 application topically as needed for irritation.    . Multiple Vitamin (MULTIVITAMIN) capsule Take 1 capsule by mouth daily.      . Omega-3 Fatty Acids (FISH OIL) 1200 MG CAPS Take 1,200 mg by mouth daily.    . pantoprazole (PROTONIX) 40 MG tablet TAKE 1 TABLET BY MOUTH EVERYDAY AT BEDTIME 30 tablet 0  . potassium chloride SA (K-DUR,KLOR-CON) 20 MEQ tablet Take 1 tablet (20 mEq total) by mouth  daily. 90 tablet 2  . witch hazel-glycerin (TUCKS) pad Apply 1 application topically as needed for itching or irritation.    Alveda Reasons 20 MG TABS tablet TAKE 1 TABLET (20 MG TOTAL) BY MOUTH DAILY WITH SUPPER. 90 tablet 1   No current facility-administered medications for this visit.     Review of Systems  Constitutional:  Constitutional negative. HENT: HENT negative.  Eyes: Eyes negative.  Cardiovascular: Cardiovascular negative.  GI: Gastrointestinal negative.  Musculoskeletal: Musculoskeletal negative.  Skin: Skin negative.  Neurological: Neurological negative. Hematologic: Hematologic/lymphatic negative.  Psychiatric: Psychiatric negative.        Objective:  Objective   Vitals:   10/10/17 0939  BP: 140/82  Pulse: 62  Resp: 20  SpO2: 96%  Weight: 149 lb (67.6 kg)  Height: 5\' 11"  (1.803 m)   Body mass index is 20.78 kg/m.  Physical Exam  Constitutional: He is oriented to person, place, and time. He appears well-developed.  Eyes: Pupils are equal, round, and reactive to light.  Neck: Normal range of motion.  Cardiovascular: Normal rate.  Pulses:      Radial pulses are 2+ on the right side, and 2+ on the left side.       Femoral pulses are 2+ on the right side, and 2+ on the left side. Pulmonary/Chest: Effort normal.  Abdominal: Soft.  Musculoskeletal: Normal range of motion. He exhibits no edema.  Neurological: He is alert and oriented to person, place, and time.  Skin: Skin is warm and dry. Capillary refill takes less than 2 seconds.  Psychiatric: He has a normal mood and affect. His behavior is normal. Judgment and thought content normal.    Data: Patent endovascular aneurysm repair with no evidence of endoleak is evident on her his follow-up duplex today.  Greatest diameter of the aorta is 5.43 cm.  I independently interpreted this reading and discussed the results with the patient.     Assessment/Plan:     72 year old male following up after endovascular  aneurysm repair.  He is doing very well with no evidence of endoleak or expansion of his aneurysm.  We will get him to follow-up in 1 year with repeat duplex.  If he has issues before that I will certainly be happy to see him sooner.     Waynetta Sandy MD [facility]

## 2017-10-13 ENCOUNTER — Ambulatory Visit (INDEPENDENT_AMBULATORY_CARE_PROVIDER_SITE_OTHER): Payer: Medicare Other

## 2017-10-13 ENCOUNTER — Telehealth: Payer: Self-pay

## 2017-10-13 DIAGNOSIS — Z95810 Presence of automatic (implantable) cardiac defibrillator: Secondary | ICD-10-CM | POA: Diagnosis not present

## 2017-10-13 DIAGNOSIS — I5022 Chronic systolic (congestive) heart failure: Secondary | ICD-10-CM

## 2017-10-13 NOTE — Telephone Encounter (Signed)
Attempted to confirm remote transmission with pt. No answer and was unable to leave a message.   

## 2017-10-14 ENCOUNTER — Other Ambulatory Visit: Payer: Self-pay

## 2017-10-14 NOTE — Patient Outreach (Signed)
[facility]) Care Management  10/14/2017  IOANNIS SCHUH 04-20-1945 [id number]   Medication Adherence call to [name] patient did not answer patient is due on Atorvastatin 80 mg and Lisinopril 40 mg. [name] is showing past due under [place].   [place] Management Direct Dial [phone]  Fax [phone] [email or web address]

## 2017-10-16 ENCOUNTER — Telehealth: Payer: Self-pay

## 2017-10-16 NOTE — Progress Notes (Signed)
EPIC Encounter for ICM Monitoring  Patient Name: Benjamin Cook is a 72 y.o. male Date: 10/16/2017 Primary Care Physican: Renato Shin, MD Primary Cardiologist:Crenshaw Electrophysiologist: Allred Dry Weight:Previous weight 146 lbs       Spoke with wife, patient not home.  She reported he has been feeling fine and no complaints of fluid accumulation but he does have a cold.   Thoracic impedance abnormal suggesting fluid accumulation starting 10/12/2017 but is trending toward baseline.  Prescribed dosage: Furosemide 40 mg0.5tablet (20 mg total)daily. Potassium 20 mEq 1 tablet daily.  Labs: 03/05/2017 Creatinine 0.68, BUN 8,   Potassium 3.4, Sodium 139, EGFR >60 03/04/2017 Creatinine 0.73, BUN 10, Potassium 4.0, Sodium 137, EGFR >60 03/03/2017 Creatinine 0.76, BUN 11, Potassium 4.0, Sodium 138, EGFR >60 02/25/2017 Creatinine 0.86, BUN 5, Potassium 4.0, Sodium 139, EGFR >60  Recommendations:  Advised to take Furosemide 40 mg x 2 days and Potassium 20 mEq 1 tablet bid x 2 days.  Return to prescribed dosages of Furosemide 40 mg 0.5 tablet (6m total) daily and Potassium 20 mEq 1 tablet daily after 2nd day.   Follow-up plan: ICM clinic phone appointment on 10/27/2017 to recheck fluid levels.     Copy of ICM check sent to Dr. ARayann Hemanand Dr CStanford Breed.   3 month ICM trend: 10/15/2017    1 Year ICM trend:       LRosalene Billings RN 10/16/2017 10:06 AM

## 2017-10-16 NOTE — Telephone Encounter (Signed)
Remote ICM transmission received.  Attempted call to patient and line busy.  

## 2017-10-27 ENCOUNTER — Ambulatory Visit (INDEPENDENT_AMBULATORY_CARE_PROVIDER_SITE_OTHER): Payer: Medicare Other

## 2017-10-27 ENCOUNTER — Telehealth: Payer: Self-pay | Admitting: Cardiology

## 2017-10-27 ENCOUNTER — Ambulatory Visit (INDEPENDENT_AMBULATORY_CARE_PROVIDER_SITE_OTHER): Payer: Medicare Other | Admitting: *Deleted

## 2017-10-27 DIAGNOSIS — I5022 Chronic systolic (congestive) heart failure: Secondary | ICD-10-CM

## 2017-10-27 DIAGNOSIS — Z95810 Presence of automatic (implantable) cardiac defibrillator: Secondary | ICD-10-CM

## 2017-10-27 DIAGNOSIS — I255 Ischemic cardiomyopathy: Secondary | ICD-10-CM

## 2017-10-27 NOTE — Telephone Encounter (Signed)
Confirmed remote transmission w/ pt wife.   

## 2017-10-27 NOTE — Progress Notes (Signed)
EPIC Encounter for ICM Monitoring  Patient Name: Benjamin Cook is a 72 y.o. male Date: 10/27/2017 Primary Care Physican: Renato Shin, MD Primary Cardiologist:Crenshaw Electrophysiologist: Allred Dry Weight:146 lbs        Spoke with wife. Heart Failure questions reviewed, pt asymptomatic.   Thoracic impedance returned to normal after taking 40 mg Furosemide x 2 days.  Prescribed: Furosemide 40 mg0.5tablet (20 mg total)daily. Potassium 20 mEq 1 tablet daily.  Labs: 03/05/2017 Creatinine 0.68, BUN 8,   Potassium 3.4, Sodium 139, EGFR >60 03/04/2017 Creatinine 0.73, BUN 10, Potassium 4.0, Sodium 137, EGFR >60 03/03/2017 Creatinine 0.76, BUN 11, Potassium 4.0, Sodium 138, EGFR >60 02/25/2017 Creatinine 0.86, BUN 5, Potassium 4.0, Sodium 139, EGFR >60  Recommendations: No changes.   Encouraged to call for fluid symptoms.  Follow-up plan: ICM clinic phone appointment on 11/17/2017.    Copy of ICM check sent to Dr. Rayann Heman.   3 month ICM trend: 10/27/2017    1 Year ICM trend:       [name], RN 10/27/2017 1:45 PM

## 2017-10-27 NOTE — Progress Notes (Signed)
Remote ICD transmission.   

## 2017-10-28 ENCOUNTER — Encounter: Payer: Self-pay | Admitting: Cardiology

## 2017-11-12 ENCOUNTER — Telehealth: Payer: Self-pay | Admitting: Endocrinology

## 2017-11-12 NOTE — Telephone Encounter (Signed)
Pt's wife called in to see if [name] has gotten a flu shot for this year.

## 2017-11-12 NOTE — Telephone Encounter (Signed)
No we did not start giving them until August and his last appt was in July

## 2017-11-17 ENCOUNTER — Ambulatory Visit (INDEPENDENT_AMBULATORY_CARE_PROVIDER_SITE_OTHER): Payer: Medicare Other

## 2017-11-17 ENCOUNTER — Telehealth: Payer: Self-pay | Admitting: Cardiology

## 2017-11-17 DIAGNOSIS — Z95810 Presence of automatic (implantable) cardiac defibrillator: Secondary | ICD-10-CM

## 2017-11-17 DIAGNOSIS — I5022 Chronic systolic (congestive) heart failure: Secondary | ICD-10-CM

## 2017-11-17 NOTE — Telephone Encounter (Signed)
Confirmed remote transmission w/ pt wife.   

## 2017-11-18 LAB — CUP PACEART REMOTE DEVICE CHECK
Battery Remaining Longevity: 62 mo
Battery Remaining Percentage: 62 %
Brady Statistic RA Percent Paced: 11 %
Brady Statistic RV Percent Paced: 1 %
Date Time Interrogation Session: 20190923175134
HIGH POWER IMPEDANCE MEASURED VALUE: 71 Ohm
HIGH POWER IMPEDANCE MEASURED VALUE: 71 Ohm
Implantable Lead Implant Date: 20180613
Implantable Lead Location: 753859
Implantable Lead Location: 753860
Implantable Pulse Generator Implant Date: 20160212
Lead Channel Impedance Value: 750 Ohm
Lead Channel Pacing Threshold Amplitude: 0.5 V
Lead Channel Pacing Threshold Pulse Width: 0.5 ms
Lead Channel Pacing Threshold Pulse Width: 0.5 ms
Lead Channel Sensing Intrinsic Amplitude: 11.6 mV
Lead Channel Sensing Intrinsic Amplitude: 3.3 mV
Lead Channel Setting Pacing Amplitude: 2 V
Lead Channel Setting Pacing Amplitude: 2.5 V
Lead Channel Setting Pacing Pulse Width: 0.5 ms
MDC IDC LEAD IMPLANT DT: 20120420
MDC IDC MSMT BATTERY VOLTAGE: 2.98 V
MDC IDC MSMT LEADCHNL RA IMPEDANCE VALUE: 400 Ohm
MDC IDC MSMT LEADCHNL RA PACING THRESHOLD AMPLITUDE: 0.5 V
MDC IDC SET LEADCHNL RV SENSING SENSITIVITY: 0.5 mV
MDC IDC STAT BRADY AP VP PERCENT: 1 %
MDC IDC STAT BRADY AP VS PERCENT: 16 %
MDC IDC STAT BRADY AS VP PERCENT: 1 %
MDC IDC STAT BRADY AS VS PERCENT: 81 %
Pulse Gen Serial Number: 7225079

## 2017-11-18 NOTE — Progress Notes (Signed)
EPIC Encounter for ICM Monitoring  Patient Name: Benjamin Cook is a 72 y.o. male Date: 11/18/2017 Primary Care Physican: Renato Shin, MD Primary Cardiologist:Crenshaw Electrophysiologist: Allred Dry Weight:146 lbs       Spoke with wife. Heart Failure questions reviewed, pt asymptomatic.   Thoracic impedance normal but was abnormal suggesting fluid accumulation from 11/14/2017 - 11/17/2017.   Prescribed: Furosemide 40 mg0.5tablet (20 mg total)daily. Potassium 20 mEq 1 tablet daily.  Labs: 03/05/2017 Creatinine 0.68, BUN 8, Potassium 3.4, Sodium 139, EGFR >60 03/04/2017 Creatinine 0.73, BUN 10, Potassium 4.0, Sodium 137, EGFR >60 03/03/2017 Creatinine 0.76, BUN 11, Potassium 4.0, Sodium 138, EGFR >60 02/25/2017 Creatinine 0.86, BUN 5, Potassium 4.0, Sodium 139, EGFR >60  Recommendations: No changes.  Encouraged to call for fluid symptoms.  Follow-up plan: ICM clinic phone appointment on 12/18/2017.       Copy of ICM check sent to Dr. Rayann Heman.   3 month ICM trend: 11/18/2017    1 Year ICM trend:       [name], RN 11/18/2017 8:32 AM

## 2017-11-29 ENCOUNTER — Other Ambulatory Visit: Payer: Self-pay | Admitting: Internal Medicine

## 2017-12-18 ENCOUNTER — Ambulatory Visit (INDEPENDENT_AMBULATORY_CARE_PROVIDER_SITE_OTHER): Payer: Medicare Other

## 2017-12-18 DIAGNOSIS — I5022 Chronic systolic (congestive) heart failure: Secondary | ICD-10-CM

## 2017-12-18 DIAGNOSIS — Z95810 Presence of automatic (implantable) cardiac defibrillator: Secondary | ICD-10-CM

## 2017-12-18 NOTE — Progress Notes (Signed)
EPIC Encounter for ICM Monitoring  Patient Name: Benjamin Cook is a 72 y.o. male Date: 12/18/2017 Primary Care Physican: Renato Shin, MD Primary Cardiologist:Crenshaw Electrophysiologist: Allred Last Weight: 146 lbs Today's Weight: 146 lbs        Heart Failure questions reviewed, pt asymptomatic.  No changes in diet and says he is following low salt diet.   Thoracic impedance abnormal suggesting fluid accumulation starting 12/15/2017.   Prescribed: Furosemide 40 mg0.5tablet (20 mg total)daily. Potassium 20 mEq 1 tablet daily.  Labs: 03/05/2017 Creatinine 0.68, BUN 8, Potassium 3.4, Sodium 139, EGFR >60 03/04/2017 Creatinine 0.73, BUN 10, Potassium 4.0, Sodium 137, EGFR >60 03/03/2017 Creatinine 0.76, BUN 11, Potassium 4.0, Sodium 138, EGFR >60 02/25/2017 Creatinine 0.86, BUN 5, Potassium 4.0, Sodium 139, EGFR >60  Recommendations: Advised to take Furosemide 40 mg 1 tablet daily x 2 days then go back to 0.5 tablet (20 mg total) daily.  Follow-up plan: ICM clinic phone appointment on 12/26/2017 to recheck fluid levels.     Copy of ICM check sent to Dr. Rayann Heman and Dr [name].    3 month ICM trend: 12/18/2017    1 Year ICM trend:       [name], RN 12/18/2017 12:05 PM

## 2017-12-26 ENCOUNTER — Ambulatory Visit (INDEPENDENT_AMBULATORY_CARE_PROVIDER_SITE_OTHER): Payer: Medicare Other

## 2017-12-26 DIAGNOSIS — I5022 Chronic systolic (congestive) heart failure: Secondary | ICD-10-CM

## 2017-12-26 DIAGNOSIS — Z95810 Presence of automatic (implantable) cardiac defibrillator: Secondary | ICD-10-CM

## 2017-12-26 NOTE — Progress Notes (Signed)
EPIC Encounter for ICM Monitoring  Patient Name: Benjamin Cook is a 72 y.o. male Date: 12/26/2017 Primary Care Physican: Renato Shin, MD Primary Cardiologist:Crenshaw Electrophysiologist: Allred Last Weight: 146 lbs                                              Heart Failure questions reviewed, pt asymptomatic.     Thoracic impedance returned to normal after taking extra Furosemide x 2 days.    Prescribed: Furosemide 40 mg0.5tablet (20 mg total)daily. Potassium 20 mEq 1 tablet daily.  Labs: 03/05/2017 Creatinine 0.68, BUN 8, Potassium 3.4, Sodium 139, EGFR >60 03/04/2017 Creatinine 0.73, BUN 10, Potassium 4.0, Sodium 137, EGFR >60 03/03/2017 Creatinine 0.76, BUN 11, Potassium 4.0, Sodium 138, EGFR >60 02/25/2017 Creatinine 0.86, BUN 5, Potassium 4.0, Sodium 139, EGFR >60  Recommendations: No changes  Follow-up plan: ICM clinic phone appointment on 01/26/2018.     Copy of ICM check sent to Dr. Rayann Heman.    3 month ICM trend: 12/25/2017    1 Year ICM trend:       [name], RN 12/26/2017 5:33 PM

## 2017-12-30 ENCOUNTER — Ambulatory Visit: Payer: Medicare Other | Admitting: Endocrinology

## 2018-01-14 ENCOUNTER — Other Ambulatory Visit: Payer: Self-pay | Admitting: Internal Medicine

## 2018-01-16 MED ORDER — CARVEDILOL 25 MG PO TABS: ORAL_TABLET | ORAL | 0 refills | Status: DC

## 2018-01-16 NOTE — Addendum Note (Signed)
Addended by: Juventino Slovak on: 01/16/2018 12:45 PM   Modules accepted: Orders

## 2018-01-26 ENCOUNTER — Ambulatory Visit (INDEPENDENT_AMBULATORY_CARE_PROVIDER_SITE_OTHER): Payer: Medicare Other

## 2018-01-26 DIAGNOSIS — I5022 Chronic systolic (congestive) heart failure: Secondary | ICD-10-CM

## 2018-01-26 DIAGNOSIS — I255 Ischemic cardiomyopathy: Secondary | ICD-10-CM

## 2018-01-26 DIAGNOSIS — Z95810 Presence of automatic (implantable) cardiac defibrillator: Secondary | ICD-10-CM | POA: Diagnosis not present

## 2018-01-27 NOTE — Progress Notes (Signed)
Remote ICD transmission.   

## 2018-01-27 NOTE — Progress Notes (Signed)
EPIC Encounter for ICM Monitoring  Patient Name: Benjamin Cook is a 72 y.o. male Date: 01/27/2018 Primary Care Physican: No primary care provider on file. Primary Cardiologist:Crenshaw Electrophysiologist: Allred Last Weight:146 lbs    Transmission reviewed.  Thoracic impedance normal    Prescribed:Furosemide 40 mg0.5tablet (20 mg total)daily. Potassium 20 mEq 1 tablet daily.  Labs: 03/05/2017 Creatinine 0.68, BUN 8, Potassium 3.4, Sodium 139, EGFR >60 03/04/2017 Creatinine 0.73, BUN 10, Potassium 4.0, Sodium 137, EGFR >60 03/03/2017 Creatinine 0.76, BUN 11, Potassium 4.0, Sodium 138, EGFR >60 02/25/2017 Creatinine 0.86, BUN 5, Potassium 4.0, Sodium 139, EGFR >60  Recommendations:No changes  Follow-up plan: ICM clinic phone appointment on1/23/2020.   Copy of ICM check sent to Dr.Allred.  3 month ICM trend: 01/26/2018     1 Year ICM trend:      [name], RN 01/27/2018 12:15 PM

## 2018-01-28 LAB — CUP PACEART REMOTE DEVICE CHECK
Battery Remaining Longevity: 59 mo
Battery Remaining Percentage: 60 %
Brady Statistic AP VS Percent: 17 %
Brady Statistic AS VP Percent: 1 %
Brady Statistic AS VS Percent: 80 %
Brady Statistic RV Percent Paced: 1 %
Date Time Interrogation Session: 20191223181908
HIGH POWER IMPEDANCE MEASURED VALUE: 75 Ohm
HIGH POWER IMPEDANCE MEASURED VALUE: 75 Ohm
Implantable Lead Location: 753860
Implantable Pulse Generator Implant Date: 20160212
Lead Channel Impedance Value: 730 Ohm
Lead Channel Pacing Threshold Amplitude: 0.5 V
Lead Channel Pacing Threshold Amplitude: 0.5 V
Lead Channel Pacing Threshold Pulse Width: 0.5 ms
Lead Channel Setting Pacing Amplitude: 2.5 V
Lead Channel Setting Pacing Pulse Width: 0.5 ms
Lead Channel Setting Sensing Sensitivity: 0.5 mV
MDC IDC LEAD IMPLANT DT: 20120420
MDC IDC LEAD IMPLANT DT: 20180613
MDC IDC LEAD LOCATION: 753859
MDC IDC MSMT BATTERY VOLTAGE: 2.98 V
MDC IDC MSMT LEADCHNL RA IMPEDANCE VALUE: 380 Ohm
MDC IDC MSMT LEADCHNL RA PACING THRESHOLD PULSEWIDTH: 0.5 ms
MDC IDC MSMT LEADCHNL RA SENSING INTR AMPL: 3.1 mV
MDC IDC MSMT LEADCHNL RV SENSING INTR AMPL: 11.6 mV
MDC IDC SET LEADCHNL RA PACING AMPLITUDE: 2 V
MDC IDC STAT BRADY AP VP PERCENT: 1 %
MDC IDC STAT BRADY RA PERCENT PACED: 12 %
Pulse Gen Serial Number: 7225079

## 2018-02-26 ENCOUNTER — Ambulatory Visit (INDEPENDENT_AMBULATORY_CARE_PROVIDER_SITE_OTHER): Payer: Medicare Other

## 2018-02-26 DIAGNOSIS — I5022 Chronic systolic (congestive) heart failure: Secondary | ICD-10-CM | POA: Diagnosis not present

## 2018-02-26 DIAGNOSIS — Z95810 Presence of automatic (implantable) cardiac defibrillator: Secondary | ICD-10-CM | POA: Diagnosis not present

## 2018-02-27 NOTE — Progress Notes (Signed)
EPIC Encounter for ICM Monitoring  Patient Name: VILIAMI BRACCO is a 73 y.o. male Date: 02/27/2018 Primary Care Physican: No primary care provider on file. Primary Cardiologist:Crenshaw Electrophysiologist: Allred Last Weight:146 lbs Today's weight: 147 lbs  Spoke with wife, patient sleeping.  She said patient is doing well.   Thoracic impedancenormal  Prescribed:Furosemide 40 mg0.5tablet (20 mg total)daily. Potassium 20 mEq 1 tablet daily.  Labs: 03/05/2017 Creatinine 0.68, BUN 8, Potassium 3.4, Sodium 139, EGFR >60 03/04/2017 Creatinine 0.73, BUN 10, Potassium 4.0, Sodium 137, EGFR >60 03/03/2017 Creatinine 0.76, BUN 11, Potassium 4.0, Sodium 138, EGFR >60 02/25/2017 Creatinine 0.86, BUN 5, Potassium 4.0, Sodium 139, EGFR >60  Recommendations:No changes.  Encouraged to call for fluid symptoms.  Patient overdue to make appt with Dr Rayann Heman.  Follow-up plan: ICM clinic phone appointment on2/25/2020.   Copy of ICM check sent to Dr.Allred.  3 month ICM trend: 02/26/2018    1 Year ICM trend:       [name], RN 02/27/2018 3:59 PM

## 2018-03-17 ENCOUNTER — Other Ambulatory Visit: Payer: Self-pay | Admitting: Cardiology

## 2018-03-31 ENCOUNTER — Ambulatory Visit (INDEPENDENT_AMBULATORY_CARE_PROVIDER_SITE_OTHER): Payer: Medicare Other

## 2018-03-31 ENCOUNTER — Other Ambulatory Visit: Payer: Self-pay

## 2018-03-31 ENCOUNTER — Other Ambulatory Visit: Payer: Self-pay | Admitting: Cardiology

## 2018-03-31 DIAGNOSIS — Z95810 Presence of automatic (implantable) cardiac defibrillator: Secondary | ICD-10-CM | POA: Diagnosis not present

## 2018-03-31 DIAGNOSIS — I5022 Chronic systolic (congestive) heart failure: Secondary | ICD-10-CM

## 2018-03-31 MED ORDER — LISINOPRIL 40 MG PO TABS: 40.0000 mg | ORAL_TABLET | Freq: Every day | ORAL | 0 refills | Status: DC

## 2018-03-31 MED ORDER — ATORVASTATIN CALCIUM 80 MG PO TABS: 80.0000 mg | ORAL_TABLET | Freq: Every day | ORAL | 0 refills | Status: DC

## 2018-03-31 MED ORDER — CARVEDILOL 25 MG PO TABS: ORAL_TABLET | ORAL | 0 refills | Status: DC

## 2018-03-31 MED ORDER — FUROSEMIDE 40 MG PO TABS: 20.0000 mg | ORAL_TABLET | Freq: Every day | ORAL | 0 refills | Status: DC

## 2018-04-01 NOTE — Progress Notes (Signed)
EPIC Encounter for ICM Monitoring  Patient Name: Benjamin Cook is a 73 y.o. male Date: 04/01/2018 Primary Care Physican: No primary care provider on file. Primary Cardiologist:Crenshaw Electrophysiologist: Allred Last Weight:147 lbs Today's weight: 147 lbs  Spoke with patient.  Heart failure questions reviewed and patient asymptomatic.  Thoracic impedancenormal  Prescribed:Furosemide 40 mg0.5tablet (20 mg total)daily. Potassium 20 mEq 1 tablet daily.  Labs: 03/05/2017 Creatinine 0.68, BUN 8, Potassium 3.4, Sodium 139, EGFR >60 03/04/2017 Creatinine 0.73, BUN 10, Potassium 4.0, Sodium 137, EGFR >60 03/03/2017 Creatinine 0.76, BUN 11, Potassium 4.0, Sodium 138, EGFR >60 02/25/2017 Creatinine 0.86, BUN 5, Potassium 4.0, Sodium 139, EGFR >60  Recommendations: No changes.  Encouraged to call for fluid symptoms.    Follow-up plan: ICM clinic phone appointment on3/30/2020. Office appointment scheduled with Dr Rayann Heman 05/22/2018.  Copy of ICM check sent to Dr.Allred.  3 month ICM trend: 03/31/2018    1 Year ICM trend:        [name], RN 04/01/2018 9:24 AM

## 2018-04-27 ENCOUNTER — Ambulatory Visit (INDEPENDENT_AMBULATORY_CARE_PROVIDER_SITE_OTHER): Payer: Medicare Other | Admitting: *Deleted

## 2018-04-27 ENCOUNTER — Other Ambulatory Visit: Payer: Self-pay

## 2018-04-27 DIAGNOSIS — I5022 Chronic systolic (congestive) heart failure: Secondary | ICD-10-CM | POA: Diagnosis not present

## 2018-04-27 DIAGNOSIS — I255 Ischemic cardiomyopathy: Secondary | ICD-10-CM

## 2018-04-28 ENCOUNTER — Other Ambulatory Visit: Payer: Self-pay | Admitting: Internal Medicine

## 2018-04-28 ENCOUNTER — Telehealth: Payer: Self-pay

## 2018-04-28 NOTE — Telephone Encounter (Signed)
Spoke with patient to remind of missed remote transmission 

## 2018-04-29 ENCOUNTER — Other Ambulatory Visit: Payer: Self-pay | Admitting: Cardiology

## 2018-04-29 ENCOUNTER — Other Ambulatory Visit: Payer: Self-pay | Admitting: Internal Medicine

## 2018-04-29 ENCOUNTER — Other Ambulatory Visit: Payer: Self-pay | Admitting: Pharmacist

## 2018-04-29 LAB — CUP PACEART REMOTE DEVICE CHECK
Battery Remaining Longevity: 56 mo
Battery Remaining Percentage: 57 %
Battery Voltage: 2.98 V
Brady Statistic AP VP Percent: 1 %
Brady Statistic AS VP Percent: 1 %
Brady Statistic AS VS Percent: 80 %
Brady Statistic RA Percent Paced: 12 %
Brady Statistic RV Percent Paced: 1 %
Date Time Interrogation Session: 20200324192520
HighPow Impedance: 74 Ohm
HighPow Impedance: 74 Ohm
Implantable Lead Implant Date: 20120420
Implantable Lead Implant Date: 20180613
Implantable Lead Location: 753859
Implantable Lead Location: 753860
Lead Channel Impedance Value: 350 Ohm
Lead Channel Impedance Value: 690 Ohm
Lead Channel Pacing Threshold Amplitude: 0.5 V
Lead Channel Pacing Threshold Amplitude: 0.5 V
Lead Channel Pacing Threshold Pulse Width: 0.5 ms
Lead Channel Pacing Threshold Pulse Width: 0.5 ms
Lead Channel Sensing Intrinsic Amplitude: 11.6 mV
Lead Channel Setting Pacing Amplitude: 2 V
Lead Channel Setting Pacing Amplitude: 2.5 V
MDC IDC MSMT LEADCHNL RA SENSING INTR AMPL: 2.8 mV
MDC IDC PG IMPLANT DT: 20160212
MDC IDC SET LEADCHNL RV PACING PULSEWIDTH: 0.5 ms
MDC IDC SET LEADCHNL RV SENSING SENSITIVITY: 0.5 mV
MDC IDC STAT BRADY AP VS PERCENT: 17 %
Pulse Gen Serial Number: 7225079

## 2018-04-29 MED ORDER — RIVAROXABAN 20 MG PO TABS: ORAL_TABLET | ORAL | 0 refills | Status: DC

## 2018-04-29 NOTE — Telephone Encounter (Signed)
72yo, 149 lbs, 0.68 on 03/05/17 - needs labs note added to upcoming appt Last OV 03/04/17 - appt made for OV already on 05/18/18

## 2018-04-30 ENCOUNTER — Other Ambulatory Visit: Payer: Self-pay | Admitting: *Deleted

## 2018-04-30 MED ORDER — FUROSEMIDE 40 MG PO TABS: 20.0000 mg | ORAL_TABLET | Freq: Every day | ORAL | 1 refills | Status: DC

## 2018-04-30 MED ORDER — LISINOPRIL 40 MG PO TABS: 40.0000 mg | ORAL_TABLET | Freq: Every day | ORAL | 1 refills | Status: DC

## 2018-04-30 MED ORDER — CARVEDILOL 25 MG PO TABS: 25.0000 mg | ORAL_TABLET | Freq: Two times a day (BID) | ORAL | 1 refills | Status: DC

## 2018-04-30 MED ORDER — POTASSIUM CHLORIDE CRYS ER 20 MEQ PO TBCR: 20.0000 meq | EXTENDED_RELEASE_TABLET | Freq: Every day | ORAL | 0 refills | Status: DC

## 2018-04-30 NOTE — Addendum Note (Signed)
Addended by: Lanna Poche R on: 04/30/2018 09:10 AM   Modules accepted: Orders

## 2018-05-04 ENCOUNTER — Ambulatory Visit (INDEPENDENT_AMBULATORY_CARE_PROVIDER_SITE_OTHER): Payer: Medicare Other

## 2018-05-04 ENCOUNTER — Other Ambulatory Visit: Payer: Self-pay

## 2018-05-04 DIAGNOSIS — Z95810 Presence of automatic (implantable) cardiac defibrillator: Secondary | ICD-10-CM

## 2018-05-04 DIAGNOSIS — I5022 Chronic systolic (congestive) heart failure: Secondary | ICD-10-CM

## 2018-05-04 NOTE — Progress Notes (Signed)
EPIC Encounter for ICM Monitoring  Patient Name: Benjamin Cook is a 73 y.o. male Date: 05/04/2018 Primary Care Physican: Patient, No Pcp Per Primary Cardiologist:Crenshaw Electrophysiologist: Allred Last Weight:147 lbs Today's weight: unknown  Spoke with wife, patient was taking a nap.  Heart failure questions reviewed and patient asymptomatic.  She said he is feeling good.    Thoracic impedanceabnormalsuggesting fluid accumulation starting 05/02/2018 due to patient ran out of Furosemide prescription for the last few days. He got a refill today.    Prescribed:Furosemide 40 mg0.5tablet (20 mg total)daily. Potassium 20 mEq 1 tablet daily.  Labs: 03/05/2017 Creatinine 0.68, BUN 8, Potassium 3.4, Sodium 139, EGFR >60 03/04/2017 Creatinine 0.73, BUN 10, Potassium 4.0, Sodium 137, EGFR >60 03/03/2017 Creatinine 0.76, BUN 11, Potassium 4.0, Sodium 138, EGFR >60 02/25/2017 Creatinine 0.86, BUN 5, Potassium 4.0, Sodium 139, EGFR >60  Recommendations: No changes. Encouraged to call for fluid symptoms.   Follow-up plan: ICM clinic phone appointment on4/08/2018 to recheck fluid levels. Office appointment scheduled with Dr Rayann Heman 05/22/2018 and needs refills.  Also needs to schedule a visit with Dr [name].   Copy of ICM check sent to Dr.Allred and Dr [name].    3 month ICM trend: 05/04/2018    1 Year ICM trend:       [name], RN 05/04/2018 5:10 PM

## 2018-05-06 NOTE — Progress Notes (Signed)
Remote ICD transmission.   

## 2018-05-14 ENCOUNTER — Telehealth (INDEPENDENT_AMBULATORY_CARE_PROVIDER_SITE_OTHER): Payer: Medicare Other | Admitting: Internal Medicine

## 2018-05-14 ENCOUNTER — Telehealth: Payer: Self-pay | Admitting: Internal Medicine

## 2018-05-14 ENCOUNTER — Other Ambulatory Visit: Payer: Self-pay

## 2018-05-14 DIAGNOSIS — I5022 Chronic systolic (congestive) heart failure: Secondary | ICD-10-CM | POA: Diagnosis not present

## 2018-05-14 DIAGNOSIS — I255 Ischemic cardiomyopathy: Secondary | ICD-10-CM | POA: Diagnosis not present

## 2018-05-14 DIAGNOSIS — I110 Hypertensive heart disease with heart failure: Secondary | ICD-10-CM | POA: Diagnosis not present

## 2018-05-14 DIAGNOSIS — I480 Paroxysmal atrial fibrillation: Secondary | ICD-10-CM | POA: Diagnosis not present

## 2018-05-14 NOTE — Telephone Encounter (Signed)
New Message   Patient has virtual appointment at 2pm today and he thinks he missed his call and would like a nurse to call him now.

## 2018-05-14 NOTE — Progress Notes (Signed)
Electrophysiology TeleHealth Note   Due to national recommendations of social distancing due to COVID 19, an audio/video telehealth visit is felt to be most appropriate for this patient at this time.  See MyChart message from today for the patient's consent to telehealth for [facility].   Date:  05/14/2018   ID:  Benjamin Cook, DOB 03-07-1945, MRN [id number]  Location: patient's home  Provider location: [address], [place]  Evaluation Performed: Follow-up visit  PCP:  Patient, No Pcp Per  Cardiologist:  Kirk Ruths, MD  Electrophysiologist:  Dr Rayann Heman  Chief Complaint:  1 year follow up  History of Present Illness:    Benjamin Cook is a 73 y.o. male who presents via audio/video conferencing for a telehealth visit today. He did not have access to video capability.  Since last being seen in our clinic, the patient reports doing very well.  He continues to [employer] yards. Today, he denies symptoms of palpitations, chest pain, shortness of breath,  lower extremity edema, dizziness, presyncope, or syncope.  The patient is otherwise without complaint today.  The patient denies symptoms of fevers, chills, cough, or new SOB worrisome for COVID 19.  Past Medical History:  Diagnosis Date  . Adrenal mass ([place])    per pt this is remote (10 years) and benign by biopsy  . AICD (automatic cardioverter/defibrillator) present   . Anxiety   . Arthritis   . CAD (coronary artery disease)    a. s/p PCI to LAD 2001 b. myoview 2014 high risk with scar LAD/RCA territory but no ischemia  . Cardiomyopathy, ischemic   . CHF (congestive heart failure) ([name])    class II/III  . COPD (chronic obstructive pulmonary disease) ([name])    smokes cigars but has quit cigarettes  . Dyspnea   . Flu 03/2015  . HTN (hypertension)   . Hyperlipidemia   . NSVT (nonsustained ventricular tachycardia) ([place]) 03/05/2017  . Paroxysmal atrial fibrillation ([name]) 03/2015   chads2vasc score is 4  . PSA  (psoriatic arthritis) ([place])    increased  . PVD (peripheral vascular disease) ([place])     Past Surgical History:  Procedure Laterality Date  . ABDOMINAL AORTIC ENDOVASCULAR STENT GRAFT N/A 03/03/2017   Procedure: ABDOMINAL AORTIC ENDOVASCULAR STENT GRAFT;  Surgeon: Waynetta Sandy, MD;  Location: [facility];  Service: Vascular;  Laterality: N/A;  . cataract surgery    . CORONARY ANGIOPLASTY WITH STENT PLACEMENT  2001   a. PCI to LAD  . IMPLANTABLE CARDIOVERTER DEFIBRILLATOR (ICD) GENERATOR CHANGE N/A 03/18/2014   a. SJM Fortify ST DR ICD implanted by [facility] for primary prevention b. gen change 03/2014   . LEAD REVISION/REPAIR N/A 07/17/2016   Procedure: Lead Revision/Repair;  Surgeon: Evans Lance, MD;  Location: [facility];  Service: Cardiovascular;  Laterality: N/A;  . LEFT HEART CATHETERIZATION WITH CORONARY ANGIOGRAM N/A 05/27/2014   Procedure: LEFT HEART CATHETERIZATION WITH CORONARY ANGIOGRAM;  Surgeon: Sherren Mocha, MD;  Location: [facility] CATH LAB;  Service: Cardiovascular;  Laterality: N/A;    Current Outpatient Medications  Medication Sig Dispense Refill  . acetaminophen (TYLENOL) 500 MG tablet Take 1,000 mg by mouth daily as needed (back pain).    Marland Kitchen atorvastatin (LIPITOR) 80 MG tablet TAKE 1 TABLET BY MOUTH  DAILY AT 6PM 30 tablet 0  . carvedilol (COREG) 25 MG tablet Take 1 tablet (25 mg total) by mouth 2 (two) times daily with a meal. 180 tablet 1  . Fluticasone-Salmeterol (  ADVAIR) 100-50 MCG/DOSE AEPB Inhale 1 puff into the lungs 2 (two) times daily. 1 each 3  . furosemide (LASIX) 40 MG tablet Take 0.5 tablets (20 mg total) by mouth daily. 45 tablet 1  . lisinopril (PRINIVIL,ZESTRIL) 40 MG tablet Take 1 tablet (40 mg total) by mouth daily. Pt must keep upcoming appt in April for further refills 90 tablet 1  . liver oil-zinc oxide (DESITIN) 40 % ointment Apply 1 application topically as needed for irritation.    . Multiple Vitamin (MULTIVITAMIN) capsule Take 1 capsule by  mouth daily.      . Omega-3 Fatty Acids (FISH OIL) 1200 MG CAPS Take 1,200 mg by mouth daily.    . pantoprazole (PROTONIX) 40 MG tablet TAKE 1 TABLET BY MOUTH EVERYDAY AT BEDTIME 30 tablet 0  . potassium chloride SA (K-DUR,KLOR-CON) 20 MEQ tablet Take 1 tablet (20 mEq total) by mouth daily. 90 tablet 0  . rivaroxaban (XARELTO) 20 MG TABS tablet TAKE 1 TABLET (20 MG TOTAL) BY MOUTH DAILY WITH SUPPER. 90 tablet 0  . witch hazel-glycerin (TUCKS) pad Apply 1 application topically as needed for itching or irritation.     No current facility-administered medications for this visit.     Allergies:   Patient has no known allergies.   Social History:  The patient  reports that he has been smoking cigars. He has never used smokeless tobacco. He reports that he does not drink alcohol or use drugs.   Family History:  The patient's  family history includes Cirrhosis in his mother.   ROS:  Please see the history of present illness.   All other systems are personally reviewed and negative.    Exam:    Well sounding   Labs/Other Tests and Data Reviewed:    Recent Labs: 08/08/2017: Hemoglobin 14.5; Platelets 191.0   Wt Readings from Last 3 Encounters:  10/10/17 149 lb (67.6 kg)  08/08/17 149 lb (67.6 kg)  04/04/17 145 lb (65.8 kg)     Other studies personally reviewed: Additional studies/ records that were reviewed today include: prior office notes  Review of the above records today demonstrates: as above  Last device remote is reviewed from [employer] dated 04/28/18 which reveals normal device function, <1% AF burden   ASSESSMENT & PLAN:    1.  Chronic systolic dysfunction/ICM Euvolemic by history Normal ICD function by last remote No changes today  2.  Hypertensive cardiovascular disease Stable No change required today  3.  Paroxysmal atrial fibrillation Burden <1% by device remote  Continue Xarelto for CHADS2VASC of 4   COVID 19 screen The patient denies symptoms of COVID  19 at this time.  The importance of social distancing was discussed today.  Follow-up:  1 year Next remote: as scheduled  Current medicines are reviewed at length with the patient today.   The patient does not have concerns regarding his medicines.  The following changes were made today:  none  Labs/ tests ordered today include:  No orders of the defined types were placed in this encounter.    Patient Risk:  after full review of this patients clinical status, I feel that they are at moderate risk at this time.  Today, I have spent 10 minutes with the patient with telehealth technology discussing ICM and atrial fibrillation.    SignedThompson Grayer, MD  05/14/2018 2:10 PM     [address] [place] [address] [phone] (office) [phone] (fax)

## 2018-05-15 ENCOUNTER — Ambulatory Visit (INDEPENDENT_AMBULATORY_CARE_PROVIDER_SITE_OTHER): Payer: Medicare Other

## 2018-05-15 ENCOUNTER — Other Ambulatory Visit: Payer: Self-pay

## 2018-05-15 DIAGNOSIS — Z95810 Presence of automatic (implantable) cardiac defibrillator: Secondary | ICD-10-CM

## 2018-05-15 DIAGNOSIS — I5022 Chronic systolic (congestive) heart failure: Secondary | ICD-10-CM

## 2018-05-15 NOTE — Progress Notes (Signed)
EPIC Encounter for ICM Monitoring  Patient Name: Benjamin Cook is a 73 y.o. male Date: 05/15/2018 Primary Care Physican: Patient, No Pcp Per Primary Cardiologist:[facility] Electrophysiologist: Allred Last Weight:147lbs Today's weight: unknown  Spoke withwife.Heart failure questions reviewed and patient asymptomatic.  She said he is feeling good.    Thoracic impedanceabnormalsuggesting fluid accumulation starting 05/02/2018 due to patient ran out of Furosemide prescription for the last few days. He got a refill today.    Prescribed:Furosemide 40 mg0.5tablet (20 mg total)daily. Potassium 20 mEq 1 tablet daily.  Labs: 03/05/2017 Creatinine 0.68, BUN 8, Potassium 3.4, Sodium 139, EGFR >60 03/04/2017 Creatinine 0.73, BUN 10, Potassium 4.0, Sodium 137, EGFR >60 03/03/2017 Creatinine 0.76, BUN 11, Potassium 4.0, Sodium 138, EGFR >60 02/25/2017 Creatinine 0.86, BUN 5, Potassium 4.0, Sodium 139, EGFR >60  Recommendations: No changes. Encouraged to call for fluid symptoms.   Follow-up plan: ICM clinic phone appointment on5/05/2018.  Copy of ICM check sent to Dr.Allred.   3 month ICM trend: 05/12/2018    1 Year ICM trend:       [name], RN 05/15/2018 1:23 PM

## 2018-05-18 ENCOUNTER — Encounter: Payer: Medicare Other | Admitting: Internal Medicine

## 2018-06-08 ENCOUNTER — Other Ambulatory Visit: Payer: Self-pay

## 2018-06-08 ENCOUNTER — Ambulatory Visit (INDEPENDENT_AMBULATORY_CARE_PROVIDER_SITE_OTHER): Payer: Medicare Other

## 2018-06-08 DIAGNOSIS — I5022 Chronic systolic (congestive) heart failure: Secondary | ICD-10-CM | POA: Diagnosis not present

## 2018-06-08 DIAGNOSIS — Z95810 Presence of automatic (implantable) cardiac defibrillator: Secondary | ICD-10-CM

## 2018-06-10 NOTE — Progress Notes (Signed)
EPIC Encounter for ICM Monitoring  Patient Name: JAYESH MARBACH is a 73 y.o. male Date: 06/10/2018 Primary Care Physican: Patient, No Pcp Per Primary Cardiologist:Crenshaw Electrophysiologist: Allred Last Weight:147lbs 06/10/2018 weight:147 lbs  Spoke with wife.  Heart failure questions reviewed and patient asymptomatic.  Thoracic impedancenormal.  Prescribed:Furosemide 40 mg0.5tablet (20 mg total)daily. Potassium 20 mEq 1 tablet daily.  Labs: 03/05/2017 Creatinine 0.68, BUN 8, Potassium 3.4, Sodium 139, EGFR >60 03/04/2017 Creatinine 0.73, BUN 10, Potassium 4.0, Sodium 137, EGFR >60 03/03/2017 Creatinine 0.76, BUN 11, Potassium 4.0, Sodium 138, EGFR >60 02/25/2017 Creatinine 0.86, BUN 5, Potassium 4.0, Sodium 139, EGFR >60  Recommendations: No changes. Encouraged to call for fluid symptoms.   Follow-up plan: ICM clinic phone appointment on6/09/2018.  Copy of ICM check sent to Dr.Allred.   3 month ICM trend: 06/08/2018    1 Year ICM trend:       [name], RN 06/10/2018 9:00 AM

## 2018-06-24 ENCOUNTER — Other Ambulatory Visit: Payer: Self-pay | Admitting: Cardiology

## 2018-06-24 ENCOUNTER — Other Ambulatory Visit: Payer: Self-pay

## 2018-06-24 NOTE — Patient Outreach (Signed)
[facility]) Care Management  06/24/2018  ARLESTER KEEHAN 01/22/1946 [id number]   Medication Adherence call to [name] [facility] [employer] [facility] spoke with patient's wife she takes care of his medications patient is due on Atorvastatin 80 mg patient is taking 1 tablet daily and has enough for 7 more days she ask if we can call [facility] to refill this medication. [facility] will mail this medication with in 5-7 days . Mr. Henrichs is showing past due under [facility].   [facility] Direct Dial [phone]  Fax [phone] [email or web address]

## 2018-07-01 ENCOUNTER — Other Ambulatory Visit: Payer: Self-pay | Admitting: Cardiology

## 2018-07-13 ENCOUNTER — Ambulatory Visit (INDEPENDENT_AMBULATORY_CARE_PROVIDER_SITE_OTHER): Payer: Medicare Other

## 2018-07-13 DIAGNOSIS — I5022 Chronic systolic (congestive) heart failure: Secondary | ICD-10-CM

## 2018-07-13 DIAGNOSIS — Z95810 Presence of automatic (implantable) cardiac defibrillator: Secondary | ICD-10-CM

## 2018-07-15 NOTE — Progress Notes (Addendum)
EPIC Encounter for ICM Monitoring  Patient Name: Benjamin Cook is a 73 y.o. male Date: 07/15/2018 Primary Care Physican: Patient, No Pcp Per Primary Cardiologist:Crenshaw Electrophysiologist: Allred Last Weight:147lbs 06/10/2018 weight:147 lbs  Transmission reviewed.  Thoracic impedancenormal.  Prescribed:Furosemide 40 mg0.5tablet (20 mg total)daily. Potassium 20 mEq 1 tablet daily.  Labs: 03/05/2017 Creatinine 0.68, BUN 8, Potassium 3.4, Sodium 139, EGFR >60 03/04/2017 Creatinine 0.73, BUN 10, Potassium 4.0, Sodium 137, EGFR >60 03/03/2017 Creatinine 0.76, BUN 11, Potassium 4.0, Sodium 138, EGFR >60 02/25/2017 Creatinine 0.86, BUN 5, Potassium 4.0, Sodium 139, EGFR >60  Recommendations: No changes.  Follow-up plan: ICM clinic phone appointment on7/13/2020.  Copy of ICM check sent to Dr.Allred.    3 month trend 07/13/2018   1 year trend    [name], RN 07/15/2018 3:48 PM

## 2018-07-27 ENCOUNTER — Ambulatory Visit (INDEPENDENT_AMBULATORY_CARE_PROVIDER_SITE_OTHER): Payer: Medicare Other | Admitting: *Deleted

## 2018-07-27 DIAGNOSIS — I5022 Chronic systolic (congestive) heart failure: Secondary | ICD-10-CM

## 2018-07-27 DIAGNOSIS — I255 Ischemic cardiomyopathy: Secondary | ICD-10-CM

## 2018-07-27 LAB — CUP PACEART REMOTE DEVICE CHECK
Battery Remaining Longevity: 53 mo
Battery Remaining Percentage: 54 %
Battery Voltage: 2.98 V
Brady Statistic AP VP Percent: 1 %
Brady Statistic AP VS Percent: 18 %
Brady Statistic AS VP Percent: 1 %
Brady Statistic AS VS Percent: 80 %
Brady Statistic RA Percent Paced: 13 %
Brady Statistic RV Percent Paced: 1 %
Date Time Interrogation Session: 20200622141503
HighPow Impedance: 71 Ohm
HighPow Impedance: 71 Ohm
Implantable Lead Implant Date: 20120420
Implantable Lead Implant Date: 20180613
Implantable Lead Location: 753859
Implantable Lead Location: 753860
Implantable Pulse Generator Implant Date: 20160212
Lead Channel Impedance Value: 340 Ohm
Lead Channel Impedance Value: 630 Ohm
Lead Channel Pacing Threshold Amplitude: 0.5 V
Lead Channel Pacing Threshold Amplitude: 0.5 V
Lead Channel Pacing Threshold Pulse Width: 0.5 ms
Lead Channel Pacing Threshold Pulse Width: 0.5 ms
Lead Channel Sensing Intrinsic Amplitude: 11.6 mV
Lead Channel Sensing Intrinsic Amplitude: 3.1 mV
Lead Channel Setting Pacing Amplitude: 2 V
Lead Channel Setting Pacing Amplitude: 2.5 V
Lead Channel Setting Pacing Pulse Width: 0.5 ms
Lead Channel Setting Sensing Sensitivity: 0.5 mV
Pulse Gen Serial Number: 7225079

## 2018-07-28 ENCOUNTER — Other Ambulatory Visit: Payer: Self-pay | Admitting: Internal Medicine

## 2018-08-04 ENCOUNTER — Other Ambulatory Visit: Payer: Self-pay | Admitting: Cardiology

## 2018-08-05 NOTE — Progress Notes (Signed)
Remote ICD transmission.   

## 2018-08-17 ENCOUNTER — Ambulatory Visit (INDEPENDENT_AMBULATORY_CARE_PROVIDER_SITE_OTHER): Payer: Medicare Other

## 2018-08-17 DIAGNOSIS — Z95810 Presence of automatic (implantable) cardiac defibrillator: Secondary | ICD-10-CM | POA: Diagnosis not present

## 2018-08-17 DIAGNOSIS — I5022 Chronic systolic (congestive) heart failure: Secondary | ICD-10-CM

## 2018-08-21 ENCOUNTER — Telehealth: Payer: Self-pay

## 2018-08-21 NOTE — Telephone Encounter (Signed)
Remote ICM transmission received.  Attempted call to patient regarding ICM remote transmission and no answer.  

## 2018-08-21 NOTE — Progress Notes (Signed)
EPIC Encounter for ICM Monitoring  Patient Name: Benjamin Cook is a 73 y.o. male Date: 08/21/2018 Primary Care Physican: Patient, No Pcp Per Primary Cardiologist:Crenshaw Electrophysiologist: Allred Last Weight:147lbs 5/06/2020weight:147 lbs  Attempted call to patient and unable to reach.   Transmission reviewed.   Thoracic impedancenormal.  Prescribed:Furosemide 40 mg0.5tablet (20 mg total)daily. Potassium 20 mEq 1 tablet daily.  Labs: 03/05/2017 Creatinine 0.68, BUN 8, Potassium 3.4, Sodium 139, EGFR >60 03/04/2017 Creatinine 0.73, BUN 10, Potassium 4.0, Sodium 137, EGFR >60 03/03/2017 Creatinine 0.76, BUN 11, Potassium 4.0, Sodium 138, EGFR >60 02/25/2017 Creatinine 0.86, BUN 5, Potassium 4.0, Sodium 139, EGFR >60  Recommendations: Unable to reach.    Follow-up plan: ICM clinic phone appointment on8/17/2020.  Copy of ICM check sent to Dr.Allred.    3 month ICM trend: 08/17/2018    1 Year ICM trend:       [name], RN 08/21/2018 8:42 AM

## 2018-08-25 ENCOUNTER — Other Ambulatory Visit: Payer: Self-pay | Admitting: Cardiology

## 2018-08-25 NOTE — Telephone Encounter (Signed)
Refill request

## 2018-09-13 ENCOUNTER — Other Ambulatory Visit: Payer: Self-pay | Admitting: Cardiology

## 2018-09-22 ENCOUNTER — Telehealth: Payer: Self-pay

## 2018-09-22 NOTE — Telephone Encounter (Signed)
Unable to speak  with patient to remind of missed remote transmission 

## 2018-09-25 NOTE — Progress Notes (Signed)
No ICM remote transmission received for 09/21/2018 and next ICM transmission scheduled for 11/16/2018.

## 2018-10-09 ENCOUNTER — Other Ambulatory Visit: Payer: Self-pay | Admitting: Cardiology

## 2018-10-09 ENCOUNTER — Other Ambulatory Visit: Payer: Self-pay | Admitting: Internal Medicine

## 2018-10-14 ENCOUNTER — Other Ambulatory Visit: Payer: Self-pay | Admitting: Pharmacist

## 2018-10-14 MED ORDER — RIVAROXABAN 20 MG PO TABS: 20.0000 mg | ORAL_TABLET | Freq: Every day | ORAL | 0 refills | Status: DC

## 2018-10-27 ENCOUNTER — Other Ambulatory Visit: Payer: Self-pay | Admitting: Cardiology

## 2018-10-27 ENCOUNTER — Ambulatory Visit (INDEPENDENT_AMBULATORY_CARE_PROVIDER_SITE_OTHER): Payer: Medicare Other | Admitting: *Deleted

## 2018-10-27 DIAGNOSIS — I480 Paroxysmal atrial fibrillation: Secondary | ICD-10-CM | POA: Diagnosis not present

## 2018-10-27 DIAGNOSIS — I5022 Chronic systolic (congestive) heart failure: Secondary | ICD-10-CM

## 2018-10-27 LAB — CUP PACEART REMOTE DEVICE CHECK
Battery Remaining Longevity: 52 mo
Battery Remaining Percentage: 53 %
Battery Voltage: 2.98 V
Brady Statistic AP VP Percent: 1 %
Brady Statistic AP VS Percent: 18 %
Brady Statistic AS VP Percent: 1 %
Brady Statistic AS VS Percent: 79 %
Brady Statistic RA Percent Paced: 14 %
Brady Statistic RV Percent Paced: 1 %
Date Time Interrogation Session: 20200922195226
HighPow Impedance: 80 Ohm
HighPow Impedance: 80 Ohm
Implantable Lead Implant Date: 20120420
Implantable Lead Implant Date: 20180613
Implantable Lead Location: 753859
Implantable Lead Location: 753860
Implantable Pulse Generator Implant Date: 20160212
Lead Channel Impedance Value: 390 Ohm
Lead Channel Impedance Value: 680 Ohm
Lead Channel Pacing Threshold Amplitude: 0.5 V
Lead Channel Pacing Threshold Amplitude: 0.5 V
Lead Channel Pacing Threshold Pulse Width: 0.5 ms
Lead Channel Pacing Threshold Pulse Width: 0.5 ms
Lead Channel Sensing Intrinsic Amplitude: 11.6 mV
Lead Channel Sensing Intrinsic Amplitude: 3.1 mV
Lead Channel Setting Pacing Amplitude: 2 V
Lead Channel Setting Pacing Amplitude: 2.5 V
Lead Channel Setting Pacing Pulse Width: 0.5 ms
Lead Channel Setting Sensing Sensitivity: 0.5 mV
Pulse Gen Serial Number: 7225079

## 2018-11-05 NOTE — Progress Notes (Signed)
Remote pacemaker transmission.   

## 2018-11-24 ENCOUNTER — Ambulatory Visit (INDEPENDENT_AMBULATORY_CARE_PROVIDER_SITE_OTHER): Payer: Medicare Other

## 2018-11-24 DIAGNOSIS — I5022 Chronic systolic (congestive) heart failure: Secondary | ICD-10-CM

## 2018-11-24 DIAGNOSIS — Z95810 Presence of automatic (implantable) cardiac defibrillator: Secondary | ICD-10-CM

## 2018-11-27 ENCOUNTER — Telehealth: Payer: Self-pay

## 2018-11-27 NOTE — Progress Notes (Signed)
EPIC Encounter for ICM Monitoring  Patient Name: Benjamin Cook is a 73 y.o. male Date: 11/27/2018 Primary Care Physican: Patient, No Pcp Per Primary Cardiologist:Crenshaw Electrophysiologist: Allred Last Weight:147lbs 5/06/2020weight:147 lbs  Attempted call to patient and unable to reach.   Transmission reviewed.   Thoracic impedancesuggesting possible ongoing fluid accumulation since 11/22/2018.  Prescribed:Furosemide 40 mg0.5tablet (20 mg total)daily. Potassium 20 mEq 1 tablet daily.  Labs: 03/05/2017 Creatinine 0.68, BUN 8, Potassium 3.4, Sodium 139, EGFR >60 03/04/2017 Creatinine 0.73, BUN 10, Potassium 4.0, Sodium 137, EGFR >60 03/03/2017 Creatinine 0.76, BUN 11, Potassium 4.0, Sodium 138, EGFR >60 02/25/2017 Creatinine 0.86, BUN 5, Potassium 4.0, Sodium 139, EGFR >60  Recommendations: Unable to reach.    Follow-up plan: ICM clinic phone appointment on 12/07/2018 to recheck fluid levels.   91 day device clinic remote transmission 01/26/2019.     Copy of ICM check sent to Dr. Rayann Heman and Dr [name].   3 month ICM trend: 11/24/2018    1 Year ICM trend:       [name], RN 11/27/2018 3:34 PM

## 2018-11-27 NOTE — Telephone Encounter (Signed)
Remote ICM transmission received.  Attempted call to patient regarding ICM remote transmission and person answering phone stated he was not home.    

## 2018-12-03 ENCOUNTER — Other Ambulatory Visit: Payer: Self-pay

## 2018-12-03 MED ORDER — ATORVASTATIN CALCIUM 80 MG PO TABS: 80.0000 mg | ORAL_TABLET | Freq: Every day | ORAL | 0 refills | Status: DC

## 2018-12-07 ENCOUNTER — Ambulatory Visit (INDEPENDENT_AMBULATORY_CARE_PROVIDER_SITE_OTHER): Payer: Medicare Other

## 2018-12-07 DIAGNOSIS — I5022 Chronic systolic (congestive) heart failure: Secondary | ICD-10-CM

## 2018-12-07 DIAGNOSIS — Z95810 Presence of automatic (implantable) cardiac defibrillator: Secondary | ICD-10-CM

## 2018-12-07 NOTE — Progress Notes (Signed)
EPIC Encounter for ICM Monitoring  Patient Name: ESTEFANO VICTORY is a 73 y.o. male Date: 12/07/2018 Primary Care Physican: Patient, No Pcp Per Primary Cardiologist:Crenshaw Electrophysiologist: Allred Last Weight:147lbs Last weight:147 lbs  Transmission reviewed.   CorVue thoracic impedancenormal.  Prescribed:Furosemide 40 mg0.5tablet (20 mg total)daily. Potassium 20 mEq 1 tablet daily.  Labs: 03/05/2017 Creatinine 0.68, BUN 8, Potassium 3.4, Sodium 139, EGFR >60  Recommendations: None.  Follow-up plan: ICM clinic phone appointment on 12/28/2018.   91 day device clinic remote transmission 01/26/2019.    Copy of ICM check sent to Dr. Rayann Heman.   3 month ICM trend: 12/07/2018    1 Year ICM trend:       [name], RN 12/07/2018 11:32 AM

## 2018-12-19 ENCOUNTER — Other Ambulatory Visit: Payer: Self-pay | Admitting: Cardiology

## 2018-12-30 ENCOUNTER — Telehealth: Payer: Self-pay

## 2018-12-30 NOTE — Telephone Encounter (Signed)
Left message for patient to remind of missed remote transmission.  

## 2018-12-30 NOTE — Progress Notes (Signed)
No ICM remote transmission received for 12/28/2018 and next ICM transmission scheduled for 01/11/2020.

## 2019-01-11 ENCOUNTER — Ambulatory Visit (INDEPENDENT_AMBULATORY_CARE_PROVIDER_SITE_OTHER): Payer: Medicare Other

## 2019-01-11 DIAGNOSIS — Z95810 Presence of automatic (implantable) cardiac defibrillator: Secondary | ICD-10-CM

## 2019-01-11 DIAGNOSIS — I5022 Chronic systolic (congestive) heart failure: Secondary | ICD-10-CM

## 2019-01-13 NOTE — Progress Notes (Signed)
EPIC Encounter for ICM Monitoring  Patient Name: Benjamin Cook is a 73 y.o. male Date: 01/13/2019 Primary Care Physican: Patient, No Pcp Per Primary Cardiologist:Crenshaw Electrophysiologist: Allred Last weight:147 lbs  Spoke with wife and patient is doing well.  CorVue thoracic impedancenormal.  Prescribed:Furosemide 40 mg0.5tablet (20 mg total)daily. Potassium 20 mEq 1 tablet daily.  Labs: 03/05/2017 Creatinine 0.68, BUN 8, Potassium 3.4, Sodium 139, EGFR >60  Recommendations:   No changes and encouraged to call if experiencing any fluid symptoms.  Follow-up plan: ICM clinic phone appointment on 02/15/2019.   91 day device clinic remote transmission 01/26/2019.    Copy of ICM check sent to Dr. Rayann Heman.   3 month ICM trend: 01/11/2019    1 Year ICM trend:       [name], RN 01/13/2019 9:18 AM

## 2019-01-21 ENCOUNTER — Other Ambulatory Visit: Payer: Self-pay | Admitting: Cardiology

## 2019-01-26 ENCOUNTER — Ambulatory Visit (INDEPENDENT_AMBULATORY_CARE_PROVIDER_SITE_OTHER): Payer: Medicare Other | Admitting: *Deleted

## 2019-01-26 DIAGNOSIS — I480 Paroxysmal atrial fibrillation: Secondary | ICD-10-CM | POA: Diagnosis not present

## 2019-01-26 LAB — CUP PACEART REMOTE DEVICE CHECK
Battery Remaining Longevity: 50 mo
Battery Remaining Percentage: 50 %
Battery Voltage: 2.96 V
Brady Statistic AP VP Percent: 1 %
Brady Statistic AP VS Percent: 18 %
Brady Statistic AS VP Percent: 1 %
Brady Statistic AS VS Percent: 79 %
Brady Statistic RA Percent Paced: 14 %
Brady Statistic RV Percent Paced: 1 %
Date Time Interrogation Session: 20201222072040
HighPow Impedance: 75 Ohm
HighPow Impedance: 75 Ohm
Implantable Lead Implant Date: 20120420
Implantable Lead Implant Date: 20180613
Implantable Lead Location: 753859
Implantable Lead Location: 753860
Implantable Pulse Generator Implant Date: 20160212
Lead Channel Impedance Value: 380 Ohm
Lead Channel Impedance Value: 580 Ohm
Lead Channel Pacing Threshold Amplitude: 0.5 V
Lead Channel Pacing Threshold Amplitude: 0.5 V
Lead Channel Pacing Threshold Pulse Width: 0.5 ms
Lead Channel Pacing Threshold Pulse Width: 0.5 ms
Lead Channel Sensing Intrinsic Amplitude: 11.6 mV
Lead Channel Sensing Intrinsic Amplitude: 2.3 mV
Lead Channel Setting Pacing Amplitude: 2 V
Lead Channel Setting Pacing Amplitude: 2.5 V
Lead Channel Setting Pacing Pulse Width: 0.5 ms
Lead Channel Setting Sensing Sensitivity: 0.5 mV
Pulse Gen Serial Number: 7225079

## 2019-02-03 ENCOUNTER — Other Ambulatory Visit: Payer: Self-pay | Admitting: Cardiology

## 2019-02-03 NOTE — Telephone Encounter (Signed)
Rx(s) sent to pharmacy electronically.  

## 2019-02-04 ENCOUNTER — Ambulatory Visit: Payer: Medicare Other | Attending: Internal Medicine

## 2019-02-04 DIAGNOSIS — Z20822 Contact with and (suspected) exposure to covid-19: Secondary | ICD-10-CM

## 2019-02-06 LAB — NOVEL CORONAVIRUS, NAA: SARS-CoV-2, NAA: NOT DETECTED

## 2019-02-08 ENCOUNTER — Telehealth: Payer: Self-pay | Admitting: *Deleted

## 2019-02-08 NOTE — Telephone Encounter (Signed)
Reviewed negative Covid 19 results with the patient's wife. She is positive so he is to isolate along with her.

## 2019-02-16 ENCOUNTER — Telehealth: Payer: Self-pay

## 2019-02-16 NOTE — Telephone Encounter (Signed)
Left message for patient to remind of missed remote transmission.  

## 2019-03-01 NOTE — Progress Notes (Signed)
No ICM remote transmission received for 02/15/2019 and next ICM transmission scheduled for 03/15/2019.

## 2019-03-12 ENCOUNTER — Encounter: Payer: Self-pay | Admitting: Student

## 2019-03-12 ENCOUNTER — Other Ambulatory Visit: Payer: Self-pay

## 2019-03-12 ENCOUNTER — Ambulatory Visit: Payer: Medicare Other | Admitting: Student

## 2019-03-12 VITALS — BP 140/74 | HR 72 | Ht 71.0 in | Wt 149.0 lb

## 2019-03-12 DIAGNOSIS — I480 Paroxysmal atrial fibrillation: Secondary | ICD-10-CM

## 2019-03-12 DIAGNOSIS — I10 Essential (primary) hypertension: Secondary | ICD-10-CM | POA: Diagnosis not present

## 2019-03-12 DIAGNOSIS — I5022 Chronic systolic (congestive) heart failure: Secondary | ICD-10-CM | POA: Diagnosis not present

## 2019-03-12 LAB — CBC
Hematocrit: 43.1 % (ref 37.5–51.0)
Hemoglobin: 14.2 g/dL (ref 13.0–17.7)
MCH: 30.2 pg (ref 26.6–33.0)
MCHC: 32.9 g/dL (ref 31.5–35.7)
MCV: 92 fL (ref 79–97)
Platelets: 253 10*3/uL (ref 150–450)
RBC: 4.7 x10E6/uL (ref 4.14–5.80)
RDW: 13.6 % (ref 11.6–15.4)
WBC: 8.2 10*3/uL (ref 3.4–10.8)

## 2019-03-12 LAB — BASIC METABOLIC PANEL
BUN/Creatinine Ratio: 7 — ABNORMAL LOW (ref 10–24)
BUN: 7 mg/dL — ABNORMAL LOW (ref 8–27)
CO2: 25 mmol/L (ref 20–29)
Calcium: 9.4 mg/dL (ref 8.6–10.2)
Chloride: 100 mmol/L (ref 96–106)
Creatinine, Ser: 0.94 mg/dL (ref 0.76–1.27)
GFR calc Af Amer: 91 mL/min/{1.73_m2} (ref >59)
GFR calc non Af Amer: 79 mL/min/{1.73_m2} (ref >59)
Glucose: 114 mg/dL — ABNORMAL HIGH (ref 65–99)
Potassium: 4.1 mmol/L (ref 3.5–5.2)
Sodium: 139 mmol/L (ref 134–144)

## 2019-03-12 LAB — CUP PACEART INCLINIC DEVICE CHECK
Battery Remaining Longevity: 52 mo
Brady Statistic RA Percent Paced: 14 %
Brady Statistic RV Percent Paced: 0.31 %
Date Time Interrogation Session: 20210205140916
HighPow Impedance: 70.875
Implantable Lead Implant Date: 20120420
Implantable Lead Implant Date: 20180613
Implantable Lead Location: 753859
Implantable Lead Location: 753860
Implantable Pulse Generator Implant Date: 20160212
Lead Channel Impedance Value: 387.5 Ohm
Lead Channel Impedance Value: 625 Ohm
Lead Channel Pacing Threshold Amplitude: 0.5 V
Lead Channel Pacing Threshold Amplitude: 0.5 V
Lead Channel Pacing Threshold Amplitude: 0.5 V
Lead Channel Pacing Threshold Amplitude: 0.5 V
Lead Channel Pacing Threshold Pulse Width: 0.5 ms
Lead Channel Pacing Threshold Pulse Width: 0.5 ms
Lead Channel Pacing Threshold Pulse Width: 0.5 ms
Lead Channel Pacing Threshold Pulse Width: 0.5 ms
Lead Channel Sensing Intrinsic Amplitude: 11.6 mV
Lead Channel Sensing Intrinsic Amplitude: 2.9 mV
Lead Channel Setting Pacing Amplitude: 2 V
Lead Channel Setting Pacing Amplitude: 2.5 V
Lead Channel Setting Pacing Pulse Width: 0.5 ms
Lead Channel Setting Sensing Sensitivity: 0.5 mV
Pulse Gen Serial Number: 7225079

## 2019-03-12 NOTE — Progress Notes (Signed)
Electrophysiology Office Note Date: 03/12/2019  ID:  Benjamin Cook, DOB 1945/12/17, MRN [id number]  PCP: Patient, No Pcp Per Primary Cardiologist: Kirk Ruths, MD Electrophysiologist: Dr. Rayann Heman   CC: Routine ICD follow-up  Benjamin Cook is a 74 y.o. male seen today for Dr. Rayann Heman.  They present today for routine electrophysiology followup.  Since last being seen in our clinic, the patient reports doing very well. He mows yards on a riding mower, and generally gets around and does anything he needs to do. They deny chest pain, palpitations, dyspnea, PND, orthopnea, nausea, vomiting, dizziness, syncope, edema, weight gain, or early satiety.  He has not had ICD shocks.   Device History: St. Jude Dual Chamber ICD implanted 2016, lead revision 07/2016 for lead fracture for chronic systolic CHF History of appropriate therapy: No History of AAD therapy: No   Past Medical History:  Diagnosis Date  . Adrenal mass ([place])    per pt this is remote (10 years) and benign by biopsy  . AICD (automatic cardioverter/defibrillator) present   . Anxiety   . Arthritis   . CAD (coronary artery disease)    a. s/p PCI to LAD 2001 b. myoview 2014 high risk with scar LAD/RCA territory but no ischemia  . Cardiomyopathy, ischemic   . CHF (congestive heart failure) ([name])    class II/III  . COPD (chronic obstructive pulmonary disease) ([name])    smokes cigars but has quit cigarettes  . Dyspnea   . Flu 03/2015  . HTN (hypertension)   . Hyperlipidemia   . NSVT (nonsustained ventricular tachycardia) ([place]) 03/05/2017  . Paroxysmal atrial fibrillation ([name]) 03/2015   chads2vasc score is 4  . PSA (psoriatic arthritis) ([place])    increased  . PVD (peripheral vascular disease) ([place])    Past Surgical History:  Procedure Laterality Date  . ABDOMINAL AORTIC ENDOVASCULAR STENT GRAFT N/A 03/03/2017   Procedure: ABDOMINAL AORTIC ENDOVASCULAR STENT GRAFT;  Surgeon: Waynetta Sandy, MD;  Location: [place];  Service: Vascular;  Laterality: N/A;  . cataract surgery    . CORONARY ANGIOPLASTY WITH STENT PLACEMENT  2001   a. PCI to LAD  . IMPLANTABLE CARDIOVERTER DEFIBRILLATOR (ICD) GENERATOR CHANGE N/A 03/18/2014   a. SJM Fortify ST DR ICD implanted by [facility] for primary prevention b. gen change 03/2014   . LEAD REVISION/REPAIR N/A 07/17/2016   Procedure: Lead Revision/Repair;  Surgeon: Evans Lance, MD;  Location: [place] CV [facility];  Service: Cardiovascular;  Laterality: N/A;  . LEFT HEART CATHETERIZATION WITH CORONARY ANGIOGRAM N/A 05/27/2014   Procedure: LEFT HEART CATHETERIZATION WITH CORONARY ANGIOGRAM;  Surgeon: Sherren Mocha, MD;  Location: [facility] CATH LAB;  Service: Cardiovascular;  Laterality: N/A;    Current Outpatient Medications  Medication Sig Dispense Refill  . acetaminophen (TYLENOL) 500 MG tablet Take 1,000 mg by mouth daily as needed (back pain).    Marland Kitchen atorvastatin (LIPITOR) 80 MG tablet Take 1 tablet (80 mg total) by mouth daily at 6 PM. NEEDS APPOINTMENT FOR FUTURE REFILLS 90 tablet 0  . carvedilol (COREG) 25 MG tablet TAKE 1 TABLET BY MOUTH  TWICE DAILY WITH A MEAL 180 tablet 1  . Fluticasone-Salmeterol (ADVAIR) 100-50 MCG/DOSE AEPB Inhale 1 puff into the lungs 2 (two) times daily. 1 each 3  . furosemide (LASIX) 40 MG tablet TAKE ONE-HALF TABLET BY  MOUTH DAILY 45 tablet 1  . lisinopril (ZESTRIL) 40 MG tablet TAKE 1 TABLET BY MOUTH  DAILY 90 tablet 1  . liver oil-zinc oxide (  DESITIN) 40 % ointment Apply 1 application topically as needed for irritation.    . Multiple Vitamin (MULTIVITAMIN) capsule Take 1 capsule by mouth daily.      . Omega-3 Fatty Acids (FISH OIL) 1200 MG CAPS Take 1,200 mg by mouth daily.    . pantoprazole (PROTONIX) 40 MG tablet TAKE 1 TABLET BY MOUTH EVERYDAY AT BEDTIME 30 tablet 0  . potassium chloride SA (KLOR-CON) 20 MEQ tablet Take 1 tablet (20 mEq total) by mouth daily. 90 tablet 0  . witch hazel-glycerin (TUCKS) pad Apply 1 application topically as needed  for itching or irritation.    Alveda Reasons 20 MG TABS tablet TAKE 1 TABLET (20 MG TOTAL) BY MOUTH DAILY WITH SUPPER. 30 tablet 0   No current facility-administered medications for this visit.    Allergies:   Patient has no known allergies.   Social History: Social History   Socioeconomic History  . Marital status: Married    Spouse name: Not on file  . Number of children: Not on file  . Years of education: Not on file  . Highest education level: Not on file  Occupational History  . Occupation: Lawn care  Tobacco Use  . Smoking status: Current Every Day Smoker    Types: Cigars  . Smokeless tobacco: Never Used  . Tobacco comment: smokes cigars now, no longer smokes cigarettes but previously smoked 2ppd  Substance and Sexual Activity  . Alcohol use: No  . Drug use: No  . Sexual activity: Not Currently    Partners: Female    Birth control/protection: None  Other Topics Concern  . Not on file  Social History Narrative  . Not on file   Social Determinants of Health   Financial Resource Strain:   . Difficulty of Paying Living Expenses: Not on file  Food Insecurity:   . Worried About Charity fundraiser in the Last Year: Not on file  . Ran Out of Food in the Last Year: Not on file  Transportation Needs:   . Lack of Transportation (Medical): Not on file  . Lack of Transportation (Non-Medical): Not on file  Physical Activity:   . Days of Exercise per Week: Not on file  . Minutes of Exercise per Session: Not on file  Stress:   . Feeling of Stress : Not on file  Social Connections:   . Frequency of Communication with Friends and Family: Not on file  . Frequency of Social Gatherings with Friends and Family: Not on file  . Attends Religious Services: Not on file  . Active Member of Clubs or Organizations: Not on file  . Attends Archivist Meetings: Not on file  . Marital Status: Not on file  Intimate Partner Violence:   . Fear of Current or Ex-Partner: Not on file   . Emotionally Abused: Not on file  . Physically Abused: Not on file  . Sexually Abused: Not on file    Family History: Family History  Problem Relation Age of Onset  . Cirrhosis Mother        due to ETOH  . Cancer Neg Hx     Review of Systems: All other systems reviewed and are otherwise negative except as noted above.   Physical Exam: Vitals:   03/12/19 0910  BP: 140/74  Pulse: 72  SpO2: 95%  Weight: 149 lb (67.6 kg)  Height: 5\' 11"  (1.803 m)     GEN- The patient is well appearing, alert and oriented x 3 today.  HEENT: normocephalic, atraumatic; sclera clear, conjunctiva pink; hearing intact; oropharynx clear; neck supple, no JVP Lymph- no cervical lymphadenopathy Lungs- Clear to ausculation bilaterally, normal work of breathing.  No wheezes, rales, rhonchi Heart- Regular rate and rhythm, no murmurs, rubs or gallops, PMI not laterally displaced GI- soft, non-tender, non-distended, bowel sounds present, no hepatosplenomegaly Extremities- no clubbing, cyanosis, or edema; DP/PT/radial pulses 2+ bilaterally MS- no significant deformity or atrophy Skin- warm and dry, no rash or lesion; ICD pocket well healed Psych- euthymic mood, full affect Neuro- strength and sensation are intact  ICD interrogation- reviewed in detail today,  See PACEART report   EKG:  EKG is ordered today. The ekg ordered today shows NSR at 72 bpm, QRS 152 bpm, PR interval 164 ms  Recent Labs: No results found for requested labs within last 8760 hours.   Wt Readings from Last 3 Encounters:  03/12/19 149 lb (67.6 kg)  10/10/17 149 lb (67.6 kg)  08/08/17 149 lb (67.6 kg)     Other studies Reviewed: Additional studies/ records that were reviewed today include: Previous office notes, most recent labwork, previous remote interrogations.   Assessment and Plan:  1.  Chronic systolic dysfunction s/p St. Jude dual chamber ICD  euvolemic today Stable on an appropriate medical regimen Normal ICD  function See Pace Art report No changes today Had initially intended to consider for Barostim, but QRS has widened since last in person follow up.  Will follow up with Echo for further.   2. HTN cardiovascular disease Somewhat elevated, but he had not taken his medications yet this am. No change at this time.   3. PAF Burden <1% by device interrogation Continue Xarelto for CHA2DS2VASC of at least 5    Current medicines are reviewed at length with the patient today.   The patient does not have concerns regarding his medicines.  The following changes were made today:  none  Labs/ tests ordered today include:  Orders Placed This Encounter  Procedures  . Basic Metabolic Panel (BMET)  . CBC  . EKG 12-Lead  . ECHOCARDIOGRAM COMPLETE   Disposition:   Will plan follow up in 12 months, pending Echo.   Jacalyn Lefevre, PA-C  03/12/2019 1:17 PM  [address] [place] [address] [phone] (office) [phone] (fax)

## 2019-03-12 NOTE — Patient Instructions (Signed)
Medication Instructions:  none *If you need a refill on your cardiac medications before your next appointment, please call your pharmacy*  Lab Work:TODAY BMET CBC If you have labs (blood work) drawn today and your tests are completely normal, you will receive your results only by: Marland Kitchen MyChart Message (if you have MyChart) OR . A paper copy in the mail If you have any lab test that is abnormal or we need to change your treatment, we will call you to review the results.  Testing/Procedures: Your physician has requested that you have an echocardiogram. Echocardiography is a painless test that uses sound waves to create images of your heart. It provides your doctor with information about the size and shape of your heart and how well your heart's chambers and valves are working. This procedure takes approximately one hour. There are no restrictions for this procedure.    Follow-Up: Pending Echo At [facility], you and your health needs are our priority.  As part of our continuing mission to provide you with exceptional heart care, we have created designated Provider Care Teams.  These Care Teams include your primary Cardiologist (physician) and Advanced Practice Providers (APPs -  Physician Assistants and Nurse Practitioners) who all work together to provide you with the care you need, when you need it.   Other Instructions Remote monitoring is used to monitor your  ICD from home. This monitoring reduces the number of office visits required to check your device to one time per year. It allows [place] to keep an eye on the functioning of your device to ensure it is working properly. You are scheduled for a device check from home on 04/27/19. You may send your transmission at any time that day. If you have a wireless device, the transmission will be sent automatically. After your physician reviews your transmission, you will receive a postcard with your next transmission date.

## 2019-03-15 ENCOUNTER — Ambulatory Visit: Payer: Medicare Other

## 2019-03-15 DIAGNOSIS — Z95810 Presence of automatic (implantable) cardiac defibrillator: Secondary | ICD-10-CM

## 2019-03-15 DIAGNOSIS — I5022 Chronic systolic (congestive) heart failure: Secondary | ICD-10-CM

## 2019-03-19 ENCOUNTER — Telehealth: Payer: Self-pay

## 2019-03-19 NOTE — Telephone Encounter (Signed)
Remote ICM transmission received.  Attempted call to patient regarding ICM remote transmission and person answering phone stated patient was sleeping.

## 2019-03-19 NOTE — Progress Notes (Signed)
EPIC Encounter for ICM Monitoring  Patient Name: Benjamin Cook is a 74 y.o. male Date: 03/19/2019 Primary Care Physican: Patient, No Pcp Per Primary Cardiologist:[place] Electrophysiologist: Allred Lastweight:147 lbs  AT/AF Burden <1%  Attempted call to patient and unable to reach.  Transmission reviewed.   CorVue thoracic impedancenormal but was suggestive of possible fluid accumulation from1/22/21 - 03/11/19.  Prescribed:Furosemide 40 mg0.5tablet (20 mg total)daily. Potassium 20 mEq 1 tablet daily.  Labs: 03/12/2019 Creatinine 0.94, BUN 7, Potassium 4.1, Sodium 139, EGFR 79-81  Recommendations: Unable to reach.    Follow-up plan: ICM clinic phone appointment on 04/28/2019.   91 day device clinic remote transmission 04/27/2019.  Office appt 03/26/2019 with Oda Kilts, PA.    Copy of ICM check sent to Dr. Rayann Heman.   3 month ICM trend: 03/15/2019    1 Year ICM trend:       [name], RN 03/19/2019 11:00 AM

## 2019-03-26 ENCOUNTER — Other Ambulatory Visit (HOSPITAL_COMMUNITY): Payer: Medicare Other

## 2019-04-01 ENCOUNTER — Other Ambulatory Visit: Payer: Self-pay | Admitting: Cardiology

## 2019-04-06 ENCOUNTER — Other Ambulatory Visit: Payer: Self-pay | Admitting: Cardiology

## 2019-04-09 ENCOUNTER — Other Ambulatory Visit: Payer: Self-pay

## 2019-04-09 ENCOUNTER — Ambulatory Visit (HOSPITAL_COMMUNITY): Payer: Medicare Other | Attending: Internal Medicine

## 2019-04-09 DIAGNOSIS — I5022 Chronic systolic (congestive) heart failure: Secondary | ICD-10-CM | POA: Insufficient documentation

## 2019-04-09 MED ORDER — PERFLUTREN LIPID MICROSPHERE
1.0000 mL | INTRAVENOUS | Status: AC | PRN
  Administered 2019-04-09: 2 mL via INTRAVENOUS

## 2019-04-12 ENCOUNTER — Telehealth: Payer: Self-pay | Admitting: Internal Medicine

## 2019-04-12 ENCOUNTER — Telehealth: Payer: Self-pay | Admitting: Cardiology

## 2019-04-12 NOTE — Telephone Encounter (Signed)
Patient's wife calling to schedule appt with Dr. [name]. It has been over 3 years since he last saw him, I explained that we would need a new referral sent over to schedule his appt. She wants to know if Dr. Rayann Heman will send over a referral to schedule with Dr. [name]. He does not have a PCP anymore. Please advise.

## 2019-04-12 NOTE — Telephone Encounter (Signed)
Spoke with patient's spouse. Patient has a cell phone and will call his wife during his visit with Dr. [name].

## 2019-04-12 NOTE — Telephone Encounter (Signed)
Patient's wife is requesting to come with patient to his appt tomorrow at 8:40am with Dr. [name]. She states that he has a hard time understanding.

## 2019-04-12 NOTE — Telephone Encounter (Signed)
Please call this patient and schedule him to be seen by Dr [name].   Pt was seen 02/2017 by Almyra Deforest, PA for Dr [name].  It's only been 2 yrs.  Must be greater than 3 to be considered a new patient.

## 2019-04-13 ENCOUNTER — Other Ambulatory Visit: Payer: Self-pay

## 2019-04-13 ENCOUNTER — Encounter: Payer: Self-pay | Admitting: Cardiology

## 2019-04-13 ENCOUNTER — Ambulatory Visit: Payer: Medicare Other | Admitting: Cardiology

## 2019-04-13 VITALS — BP 140/70 | HR 67 | Temp 97.0°F | Ht 71.0 in | Wt 147.0 lb

## 2019-04-13 DIAGNOSIS — I255 Ischemic cardiomyopathy: Secondary | ICD-10-CM | POA: Diagnosis not present

## 2019-04-13 DIAGNOSIS — I714 Abdominal aortic aneurysm, without rupture, unspecified: Secondary | ICD-10-CM

## 2019-04-13 DIAGNOSIS — I5022 Chronic systolic (congestive) heart failure: Secondary | ICD-10-CM

## 2019-04-13 DIAGNOSIS — I10 Essential (primary) hypertension: Secondary | ICD-10-CM | POA: Diagnosis not present

## 2019-04-13 DIAGNOSIS — I480 Paroxysmal atrial fibrillation: Secondary | ICD-10-CM

## 2019-04-13 MED ORDER — ENTRESTO 49-51 MG PO TABS: 1.0000 | ORAL_TABLET | Freq: Two times a day (BID) | ORAL | 12 refills | Status: DC

## 2019-04-13 NOTE — Patient Instructions (Signed)
Medication Instructions:  STOP LISINOPRIL TODAY-DO NOT TAKE ANYMORE  ON 04-15-19 START ENTRESTO 49/51 MG ONE TABLET TWICE DAILY  *If you need a refill on your cardiac medications before your next appointment, please call your pharmacy*   Lab Work: Your physician recommends that you return for lab work in: [place]  If you have labs (blood work) drawn today and your tests are completely normal, you will receive your results only by: Marland Kitchen MyChart Message (if you have MyChart) OR . A paper copy in the mail If you have any lab test that is abnormal or we need to change your treatment, we will call you to review the results.   Follow-Up: At [facility], you and your health needs are our priority.  As part of our continuing mission to provide you with exceptional heart care, we have created designated Provider Care Teams.  These Care Teams include your primary Cardiologist (physician) and Advanced Practice Providers (APPs -  Physician Assistants and Nurse Practitioners) who all work together to provide you with the care you need, when you need it.  We recommend signing up for the patient portal called "MyChart".  Sign up information is provided on this After Visit Summary.  MyChart is used to connect with patients for Virtual Visits (Telemedicine).  Patients are able to view lab/test results, encounter notes, upcoming appointments, etc.  Non-urgent messages can be sent to your provider as well.   To learn more about what you can do with MyChart, go to [email or web address].    Your next appointment:   6 month(s)  The format for your next appointment:   In Person  Provider:   You may see Kirk Ruths, MD or one of the following Advanced Practice Providers on your designated Care Team:    Kerin Ransom, PA-C  [place], [place]  Coletta Memos, 

## 2019-04-13 NOTE — Progress Notes (Signed)
HPI: FU coronary disease, ischemic cardiomyopathy, PAF, prior ICD, hypertension and hyperlipidemia. Cardiac history dates back to June of 2001. At that time the patient was found to have an abnormal electrocardiogram. Catheterization revealed a 95% mid LAD and a 70% diffuse right coronary artery. His ejection fraction was 25-30%. He had PCI of his LAD at that time. Last cardiac catheterization April 2016 showed diffuse nonobstructive coronary disease and ejection fraction 35%. Patient also with paroxysmal atrial fibrillation noted on previous ICD interrogation. Echocardiogram March 2021 showed ejection fraction 35%, mild left ventricular hypertrophy, grade 1 diastolic dysfunction.  Also with history of abdominal aortic aneurysm repair.  Since last seen,  he has some dyspnea on exertion unchanged.  No orthopnea, PND, pedal edema, chest pain or syncope.  No bleeding.  Current Outpatient Medications  Medication Sig Dispense Refill  . acetaminophen (TYLENOL) 500 MG tablet Take 1,000 mg by mouth daily as needed (back pain).    Marland Kitchen atorvastatin (LIPITOR) 80 MG tablet Take 1 tablet (80 mg total) by mouth daily at 6 PM. Please schedule annual appt with Dr. [name] for refills. [phone]. 1st attempt. 30 tablet 0  . carvedilol (COREG) 25 MG tablet TAKE 1 TABLET BY MOUTH  TWICE DAILY WITH A MEAL 180 tablet 1  . furosemide (LASIX) 40 MG tablet TAKE ONE-HALF TABLET BY  MOUTH DAILY 45 tablet 1  . lisinopril (ZESTRIL) 40 MG tablet TAKE 1 TABLET BY MOUTH  DAILY 90 tablet 1  . Multiple Vitamin (MULTIVITAMIN) capsule Take 1 capsule by mouth daily.      . Omega-3 Fatty Acids (FISH OIL) 1200 MG CAPS Take 1,200 mg by mouth daily.    . potassium chloride SA (KLOR-CON) 20 MEQ tablet Take 1 tablet (20 mEq total) by mouth daily. 90 tablet 0  . XARELTO 20 MG TABS tablet TAKE 1 TABLET (20 MG TOTAL) BY MOUTH DAILY WITH SUPPER. 90 tablet 1  . Fluticasone-Salmeterol (ADVAIR) 100-50 MCG/DOSE AEPB Inhale 1 puff into the  lungs 2 (two) times daily. 1 each 3   No current facility-administered medications for this visit.     Past Medical History:  Diagnosis Date  . Adrenal mass ([place])    per pt this is remote (10 years) and benign by biopsy  . AICD (automatic cardioverter/defibrillator) present   . Anxiety   . Arthritis   . CAD (coronary artery disease)    a. s/p PCI to LAD 2001 b. myoview 2014 high risk with scar LAD/RCA territory but no ischemia  . Cardiomyopathy, ischemic   . CHF (congestive heart failure) ([name])    class II/III  . COPD (chronic obstructive pulmonary disease) ([name])    smokes cigars but has quit cigarettes  . Dyspnea   . Flu 03/2015  . HTN (hypertension)   . Hyperlipidemia   . NSVT (nonsustained ventricular tachycardia) ([place]) 03/05/2017  . Paroxysmal atrial fibrillation ([name]) 03/2015   chads2vasc score is 4  . PSA (psoriatic arthritis) ([place])    increased  . PVD (peripheral vascular disease) ([place])     Past Surgical History:  Procedure Laterality Date  . ABDOMINAL AORTIC ENDOVASCULAR STENT GRAFT N/A 03/03/2017   Procedure: ABDOMINAL AORTIC ENDOVASCULAR STENT GRAFT;  Surgeon: Waynetta Sandy, MD;  Location: [place];  Service: Vascular;  Laterality: N/A;  . cataract surgery    . CORONARY ANGIOPLASTY WITH STENT PLACEMENT  2001   a. PCI to LAD  . IMPLANTABLE CARDIOVERTER DEFIBRILLATOR (ICD) GENERATOR CHANGE N/A 03/18/2014   a. SJM Fortify  ST DR ICD implanted by [facility] for primary prevention b. gen change 03/2014   . LEAD REVISION/REPAIR N/A 07/17/2016   Procedure: Lead Revision/Repair;  Surgeon: Evans Lance, MD;  Location: [place] CV LAB;  Service: Cardiovascular;  Laterality: N/A;  . LEFT HEART CATHETERIZATION WITH CORONARY ANGIOGRAM N/A 05/27/2014   Procedure: LEFT HEART CATHETERIZATION WITH CORONARY ANGIOGRAM;  Surgeon: Sherren Mocha, MD;  Location: [facility] CATH LAB;  Service: Cardiovascular;  Laterality: N/A;    Social History   Socioeconomic History  . Marital status:  Married    Spouse name: Not on file  . Number of children: Not on file  . Years of education: Not on file  . Highest education level: Not on file  Occupational History  . Occupation: Lawn care  Tobacco Use  . Smoking status: Current Every Day Smoker    Types: Cigars  . Smokeless tobacco: Never Used  . Tobacco comment: smokes cigars now, no longer smokes cigarettes but previously smoked 2ppd  Substance and Sexual Activity  . Alcohol use: No  . Drug use: No  . Sexual activity: Not Currently    Partners: Female    Birth control/protection: None  Other Topics Concern  . Not on file  Social History Narrative  . Not on file   Social Determinants of Health   Financial Resource Strain:   . Difficulty of Paying Living Expenses: Not on file  Food Insecurity:   . Worried About Charity fundraiser in the Last Year: Not on file  . Ran Out of Food in the Last Year: Not on file  Transportation Needs:   . Lack of Transportation (Medical): Not on file  . Lack of Transportation (Non-Medical): Not on file  Physical Activity:   . Days of Exercise per Week: Not on file  . Minutes of Exercise per Session: Not on file  Stress:   . Feeling of Stress : Not on file  Social Connections:   . Frequency of Communication with Friends and Family: Not on file  . Frequency of Social Gatherings with Friends and Family: Not on file  . Attends Religious Services: Not on file  . Active Member of Clubs or Organizations: Not on file  . Attends Archivist Meetings: Not on file  . Marital Status: Not on file  Intimate Partner Violence:   . Fear of Current or Ex-Partner: Not on file  . Emotionally Abused: Not on file  . Physically Abused: Not on file  . Sexually Abused: Not on file    Family History  Problem Relation Age of Onset  . Cirrhosis Mother        due to ETOH  . Cancer Neg Hx     ROS: Weakness but no fevers or chills, productive cough, hemoptysis, dysphasia, odynophagia, melena,  hematochezia, dysuria, hematuria, rash, seizure activity, orthopnea, PND, pedal edema, claudication. Remaining systems are negative.  Physical Exam: Well-developed well-nourished in no acute distress.  Skin is warm and dry.  HEENT is normal.  Neck is supple.  Chest is clear to auscultation with normal expansion.  Cardiovascular exam is regular rate and rhythm.  Abdominal exam nontender or distended. No masses palpated. Extremities show no edema. neuro grossly intact  A/P  1 coronary artery disease-patient denies chest pain.  Plan to continue statin.  He is not on aspirin given need for Xarelto.  2 history of paroxysmal atrial fibrillation-continue beta-blocker and Xarelto.  3 ischemic cardiomyopathy-no congestive heart failure on examination.  Continue beta-blocker.  Discontinue lisinopril.  48 hours later we will begin Entresto 49/51 twice daily.  Check potassium and renal function in 1 week.  4 history of abdominal aortic aneurysm repair-needs follow-up with vascular surgery.  5 history of hyperlipidemia-continue statin.  6 hypertension-blood pressure mildly elevated.  Change lisinopril to [facility] as described above.  Follow blood pressure and adjust regimen as needed.  7 tobacco abuse-patient counseled on discontinuing.  8 ICD-followed by electrophysiology.  Kirk Ruths, MD

## 2019-04-21 ENCOUNTER — Emergency Department (HOSPITAL_COMMUNITY): Payer: Medicare Other

## 2019-04-21 ENCOUNTER — Encounter (HOSPITAL_COMMUNITY): Payer: Self-pay

## 2019-04-21 ENCOUNTER — Other Ambulatory Visit: Payer: Self-pay

## 2019-04-21 ENCOUNTER — Observation Stay (HOSPITAL_COMMUNITY)
Admission: EM | Admit: 2019-04-21 | Discharge: 2019-04-22 | Disposition: A | Discharge status: Home / Self Care | Payer: Medicare Other | Attending: Cardiology | Admitting: Cardiology

## 2019-04-21 DIAGNOSIS — Z9861 Coronary angioplasty status: Secondary | ICD-10-CM | POA: Diagnosis not present

## 2019-04-21 DIAGNOSIS — Z79899 Other long term (current) drug therapy: Secondary | ICD-10-CM | POA: Insufficient documentation

## 2019-04-21 DIAGNOSIS — J449 Chronic obstructive pulmonary disease, unspecified: Secondary | ICD-10-CM | POA: Diagnosis not present

## 2019-04-21 DIAGNOSIS — Z950 Presence of cardiac pacemaker: Secondary | ICD-10-CM | POA: Insufficient documentation

## 2019-04-21 DIAGNOSIS — Z954 Presence of other heart-valve replacement: Secondary | ICD-10-CM | POA: Diagnosis not present

## 2019-04-21 DIAGNOSIS — I480 Paroxysmal atrial fibrillation: Secondary | ICD-10-CM | POA: Insufficient documentation

## 2019-04-21 DIAGNOSIS — I110 Hypertensive heart disease with heart failure: Secondary | ICD-10-CM | POA: Diagnosis not present

## 2019-04-21 DIAGNOSIS — I472 Ventricular tachycardia, unspecified: Secondary | ICD-10-CM

## 2019-04-21 DIAGNOSIS — E785 Hyperlipidemia, unspecified: Secondary | ICD-10-CM | POA: Diagnosis not present

## 2019-04-21 DIAGNOSIS — Z20822 Contact with and (suspected) exposure to covid-19: Secondary | ICD-10-CM | POA: Diagnosis not present

## 2019-04-21 DIAGNOSIS — I5022 Chronic systolic (congestive) heart failure: Secondary | ICD-10-CM | POA: Diagnosis not present

## 2019-04-21 DIAGNOSIS — F17290 Nicotine dependence, other tobacco product, uncomplicated: Secondary | ICD-10-CM | POA: Diagnosis not present

## 2019-04-21 DIAGNOSIS — Z4502 Encounter for adjustment and management of automatic implantable cardiac defibrillator: Secondary | ICD-10-CM

## 2019-04-21 DIAGNOSIS — I2510 Atherosclerotic heart disease of native coronary artery without angina pectoris: Secondary | ICD-10-CM | POA: Diagnosis not present

## 2019-04-21 DIAGNOSIS — R42 Dizziness and giddiness: Secondary | ICD-10-CM | POA: Diagnosis not present

## 2019-04-21 DIAGNOSIS — J439 Emphysema, unspecified: Secondary | ICD-10-CM | POA: Diagnosis not present

## 2019-04-21 HISTORY — DX: Encounter for adjustment and management of automatic implantable cardiac defibrillator: Z45.02

## 2019-04-21 LAB — CBC
HCT: 46.5 % (ref 39.0–52.0)
Hemoglobin: 15.4 g/dL (ref 13.0–17.0)
MCH: 30.6 pg (ref 26.0–34.0)
MCHC: 33.1 g/dL (ref 30.0–36.0)
MCV: 92.3 fL (ref 80.0–100.0)
Platelets: 202 10*3/uL (ref 150–400)
RBC: 5.04 MIL/uL (ref 4.22–5.81)
RDW: 15.1 % (ref 11.5–15.5)
WBC: 9.8 10*3/uL (ref 4.0–10.5)
nRBC: 0 % (ref 0.0–0.2)

## 2019-04-21 MED ORDER — SODIUM CHLORIDE 0.9% FLUSH
3.0000 mL | Freq: Once | INTRAVENOUS | Status: AC
  Administered 2019-04-22: 3 mL via INTRAVENOUS

## 2019-04-21 NOTE — ED Triage Notes (Signed)
Pt arrives to ED w/ c/o defibrillator firing. Pt states he was getting ready to use the bathroom, suddenly felt dizzy, and then his defibrillator fired. Pt denies chest pain or SOB at this time.

## 2019-04-22 ENCOUNTER — Encounter (HOSPITAL_COMMUNITY): Payer: Self-pay | Admitting: Internal Medicine

## 2019-04-22 DIAGNOSIS — Z4502 Encounter for adjustment and management of automatic implantable cardiac defibrillator: Secondary | ICD-10-CM

## 2019-04-22 DIAGNOSIS — I472 Ventricular tachycardia: Secondary | ICD-10-CM

## 2019-04-22 LAB — BASIC METABOLIC PANEL
Anion gap: 10 (ref 5–15)
BUN: 8 mg/dL (ref 8–23)
CO2: 26 mmol/L (ref 22–32)
Calcium: 9.4 mg/dL (ref 8.9–10.3)
Chloride: 103 mmol/L (ref 98–111)
Creatinine, Ser: 0.97 mg/dL (ref 0.61–1.24)
GFR calc Af Amer: >60 mL/min (ref >60)
GFR calc non Af Amer: >60 mL/min (ref >60)
Glucose, Bld: 127 mg/dL — ABNORMAL HIGH (ref 70–99)
Potassium: 4.2 mmol/L (ref 3.5–5.1)
Sodium: 139 mmol/L (ref 135–145)

## 2019-04-22 LAB — TROPONIN I (HIGH SENSITIVITY)
Troponin I (High Sensitivity): 19 ng/L — ABNORMAL HIGH (ref <18)
Troponin I (High Sensitivity): 20 ng/L — ABNORMAL HIGH (ref <18)

## 2019-04-22 LAB — MAGNESIUM: Magnesium: 1.9 mg/dL (ref 1.7–2.4)

## 2019-04-22 LAB — SARS CORONAVIRUS 2 (TAT 6-24 HRS): SARS Coronavirus 2: NEGATIVE

## 2019-04-22 MED ORDER — AMIODARONE HCL 200 MG PO TABS
200.0000 mg | ORAL_TABLET | Freq: Every day | ORAL | Status: DC
  Administered 2019-04-22: 200 mg via ORAL
  Filled 2019-04-22: qty 1

## 2019-04-22 MED ORDER — SODIUM CHLORIDE 0.9% FLUSH: 3.0000 mL | INTRAVENOUS | Status: DC | PRN

## 2019-04-22 MED ORDER — MOMETASONE FURO-FORMOTEROL FUM 100-5 MCG/ACT IN AERO
2.0000 | INHALATION_SPRAY | Freq: Two times a day (BID) | RESPIRATORY_TRACT | Status: DC
  Filled 2019-04-22: qty 8.8

## 2019-04-22 MED ORDER — ACETAMINOPHEN 325 MG PO TABS: 650.0000 mg | ORAL_TABLET | ORAL | Status: DC | PRN

## 2019-04-22 MED ORDER — MAGNESIUM SULFATE IN D5W 1-5 GM/100ML-% IV SOLN
1.0000 g | Freq: Once | INTRAVENOUS | Status: AC
  Administered 2019-04-22: 05:00:00 1 g via INTRAVENOUS
  Filled 2019-04-22: qty 100

## 2019-04-22 MED ORDER — CARVEDILOL 25 MG PO TABS
25.0000 mg | ORAL_TABLET | Freq: Two times a day (BID) | ORAL | Status: DC
  Administered 2019-04-22 (×2): 25 mg via ORAL
  Filled 2019-04-22 (×2): qty 1

## 2019-04-22 MED ORDER — ATORVASTATIN CALCIUM 80 MG PO TABS
80.0000 mg | ORAL_TABLET | Freq: Every day | ORAL | Status: DC
  Administered 2019-04-22: 80 mg via ORAL
  Filled 2019-04-22: qty 1

## 2019-04-22 MED ORDER — SACUBITRIL-VALSARTAN 49-51 MG PO TABS
1.0000 | ORAL_TABLET | Freq: Two times a day (BID) | ORAL | Status: DC
  Administered 2019-04-22: 1 via ORAL
  Filled 2019-04-22 (×2): qty 1

## 2019-04-22 MED ORDER — SODIUM CHLORIDE 0.9% FLUSH: 3.0000 mL | Freq: Two times a day (BID) | INTRAVENOUS | Status: DC

## 2019-04-22 MED ORDER — FUROSEMIDE 20 MG PO TABS
20.0000 mg | ORAL_TABLET | Freq: Every day | ORAL | Status: DC
  Administered 2019-04-22: 09:00:00 20 mg via ORAL
  Filled 2019-04-22: qty 1

## 2019-04-22 MED ORDER — SODIUM CHLORIDE 0.9 % IV SOLN: 250.0000 mL | INTRAVENOUS | Status: DC | PRN

## 2019-04-22 MED ORDER — RIVAROXABAN 20 MG PO TABS
20.0000 mg | ORAL_TABLET | Freq: Every day | ORAL | Status: DC
  Administered 2019-04-22: 20 mg via ORAL
  Filled 2019-04-22: qty 1

## 2019-04-22 NOTE — Discharge Summary (Addendum)
DISCHARGE SUMMARY    Patient ID: Benjamin Cook,  MRN: [id number], DOB/AGE: 1945-11-25 74 y.o.  Admit date: 04/21/2019 Discharge date: 04/22/2019  Primary Care Physician: Patient, No Pcp Per  Primary Cardiologist: Dr. [name] Electrophysiologist: Dr. Rayann Heman  Primary Discharge Diagnosis:  1. VT  Secondary Discharge Diagnosis:  1. CAD 2. ICM 3. Paroxysmal Afib     CHA2DS2Vasc is 5, on xarelto, appropriately dosed 4. HTN  No Known Allergies   Procedures This Admission:  none    Brief HPI: Benjamin Cook is a 74 y.o. male with PMHx of CAD (2001: 95% mid LAD and a 70% diffuse right coronary artery. His ejection fraction was 25-30%. He had PCI of his LAD at that time. Last cardiac catheterization April 2016 showed diffuse nonobstructive coronary disease and ejection fraction 35%), ICM w/ICD, AFib, HTN, HLD, AAA (s/p endovascular repair, Dr. Donzetta Matters), COPD  Had a shock from his ICD, called to after hours line and was recommended he go to the ER.   Hospital Course:  The patient's device was interrogated in the ER, found to have been appropriately shocked by his device for VT and cardiology admitted for observation and EP evaluation. Labs largely unremarkable and  Non-contributory.  HS Trop only 19, 20, easily explained by the shock He reported he has felt well of late, no CP, no exertional intolerances.  He reported himself as active, busy, no exercise, and no DOE with usual daily activities.  No dizzy spells, near syncope or syncope until yesterday  He was on the toilet started feeling lightheaded and then then was shocked, he did not faint, did not have any CP, palpitations, felt well after the shock as well, and has since.  Device information SJM dual chamber ICD,  Implanted 2012, gen change 03/18/2014 Has an abandoned RV lead (fracture), new lead placed 2018   The patient tells me he has been shocked byhis device on a couple of occassions over the years, can not  recall specifics I do not see mention in notes of any prior h/o appropriate or inaapropriate shocks (even with his prior RV lead failure)   Device transmission Battery and lead measurements are OK 04/21/2019 @ 9:31PM VT episode This was a true MMVT, 203bpm, one ATP delivered failed, , one HV therapy, 25J with clean break CorVue is below threshold, but not  far   I note an AMS episode from March 11, is noise on the A lead (this has been observed on prior in clinic and remotes when I review them), lead impedance and measurements have been stable. AMS burden is <1%  Telemetry remained with rare PVC's, 3 beat NSVT only.  He continued to feel well, not  Found to be volume OL, VSS. ATP therapies were added to his VT zone (3 burst and 3 ramp) in hopes of successful ATP (he had only a single spin programmed).  Programming change was completed by [employer] rep. No changes are being made to his medicines, not felt to require ADD with recent start of Entresto.  The patient was seen and examined by Dr. Lovena Le and considered stable for discharge to home.   The patient was made aware of no driving for 6 months, [place] law.  Follow up is in place  Physical Exam: Vitals:   04/22/19 0500 04/22/19 0600 04/22/19 0622 04/22/19 0843  BP: 136/77 (!) 141/72 124/88 (!) 115/58  Pulse: 70 67 78 62  Resp: (!) 22 20 (!) 23 20  Temp:   97.7 F (36.5 C) 97.7 F (36.5 C)  TempSrc:   Oral Oral  SpO2: 95% 95% 95% 95%  Weight:   63.3 kg   Height:   5\' 11"  (1.803 m)      Labs:   Lab Results  Component Value Date   WBC 9.8 04/21/2019   HGB 15.4 04/21/2019   HCT 46.5 04/21/2019   MCV 92.3 04/21/2019   PLT 202 04/21/2019    Recent Labs  Lab 04/21/19 2307  NA 139  K 4.2  CL 103  CO2 26  BUN 8  CREATININE 0.97  CALCIUM 9.4  GLUCOSE 127*    Discharge Medications:  Allergies as of 04/22/2019   No Known Allergies      Medication List     TAKE these medications    acetaminophen 500 MG  tablet Commonly known as: TYLENOL Take 500-1,000 mg by mouth every 8 (eight) hours as needed (for pain).   atorvastatin 80 MG tablet Commonly known as: LIPITOR Take 1 tablet (80 mg total) by mouth daily at 6 PM. Please schedule annual appt with Dr. [name] for refills. [phone]. 1st attempt. What changed: additional instructions   carvedilol 25 MG tablet Commonly known as: COREG TAKE 1 TABLET BY MOUTH  TWICE DAILY WITH A MEAL What changed: See the new instructions.   Entresto 49-51 MG Generic drug: sacubitril-valsartan Take 1 tablet by mouth 2 (two) times daily.   Fish Oil 1200 MG Caps Take 1,200 mg by mouth daily.   Fluticasone-Salmeterol 100-50 MCG/DOSE Aepb Commonly known as: ADVAIR Inhale 1 puff into the lungs 2 (two) times daily. What changed:  when to take this reasons to take this   furosemide 40 MG tablet Commonly known as: LASIX TAKE ONE-HALF TABLET BY  MOUTH DAILY   ibuprofen 200 MG tablet Commonly known as: ADVIL Take 400 mg by mouth every 6 (six) hours as needed for headache or mild pain. Notes to patient: Minimize use   multivitamin capsule Take 1 capsule by mouth daily.   potassium chloride SA 20 MEQ tablet Commonly known as: KLOR-CON Take 1 tablet (20 mEq total) by mouth daily.   Xarelto 20 MG Tabs tablet Generic drug: rivaroxaban TAKE 1 TABLET (20 MG TOTAL) BY MOUTH DAILY WITH SUPPER. What changed: See the new instructions.        Disposition: Home Discharge Instructions     Diet - low sodium heart healthy   Complete by: As directed    Increase activity slowly   Complete by: As directed       Follow-up Information     Baldwin Jamaica, PA-C Follow up.   Specialty: Cardiology Why: 05/13/2019 @ 1:45PM Contact information: [address] [place] [address] [phone]            Duration of Discharge Encounter: Greater than 30 minutes including physician time.  Benjamin Night, PA-C 04/22/2019 4:05  PM   EP Attending  Patient seen and examined. Agree with above. The patient is stable for dc home. I do not think he needs amiodarone but if he has more VT, then this would be a consideration. He has been placed on Entresto and we will see how he does. He will followup with Dr. Greggory Brandy.   Mikle Bosworth.D.

## 2019-04-22 NOTE — Progress Notes (Signed)
New Patient Note: Patient received from [facility] ED to room [phone]. Placed on telemetry. Physical assessment completed via computerized charting per [facility] policy. No family present at bedside. Will give end of shift report to oncoming nurse

## 2019-04-22 NOTE — ED Provider Notes (Signed)
Emergency Department Provider Note   I have reviewed the triage vital signs and the nursing notes.   HISTORY  Chief Complaint Pacemaker Problem   HPI Benjamin Cook is a 74 y.o. male with multiple medical problems documented below who presents the emergency department today secondary to defibrillation.  Patient has a notable history of coronary artery disease and ischemic cardiomyopathy status post defibrillator.  Patient EF 2 weeks ago was 35%.  Patient states that he was in the bathroom started feeling lightheaded like he might pass out.  Sat on the toilet and a couple seconds later he felt his defibrillator fire after the symptoms from that resolved he felt improved.  Called the hospital was instructed to come here for further evaluation.  Patient is asymptomatic at this time.  He states he has been stressed recently but no other changes.   No other associated or modifying symptoms.    Past Medical History:  Diagnosis Date  . Adrenal mass ([place])    per pt this is remote (10 years) and benign by biopsy  . AICD (automatic cardioverter/defibrillator) present   . Anxiety   . Arthritis   . CAD (coronary artery disease)    a. s/p PCI to LAD 2001 b. myoview 2014 high risk with scar LAD/RCA territory but no ischemia  . Cardiomyopathy, ischemic   . CHF (congestive heart failure) ([name])    class II/III  . COPD (chronic obstructive pulmonary disease) ([name])    smokes cigars but has quit cigarettes  . Dyspnea   . Flu 03/2015  . HTN (hypertension)   . Hyperlipidemia   . NSVT (nonsustained ventricular tachycardia) ([place]) 03/05/2017  . Paroxysmal atrial fibrillation ([name]) 03/2015   chads2vasc score is 4  . PSA (psoriatic arthritis) ([place])    increased  . PVD (peripheral vascular disease) [facility])     Patient Active Problem List   Diagnosis Date Noted  . Hypocalcemia 08/08/2017  . Screening examination for infectious disease 08/08/2017  . Anemia, iron deficiency 08/08/2017  . NSVT  (nonsustained ventricular tachycardia) ([place]) 03/05/2017  . AAA (abdominal aortic aneurysm) ([place]) 03/03/2017  . Low back pain 01/01/2017  . Adrenal mass ([place]) 11/24/2016  . Acute Sigmoid diverticulitis 11/15/2016  . Diverticulitis 11/15/2016  . ICD (implantable cardioverter-defibrillator) lead failure 07/16/2016  . Paroxysmal atrial fibrillation ([place]) 06/26/2015  . Dyspnea 04/10/2015  . Acute respiratory failure ([place]) 03/28/2015  . Essential hypertension 03/28/2015  . Systolic CHF ([place]) 0000000  . Pyrexia   . Shortness of breath   . Influenza A   . VT (ventricular tachycardia) ([place])   . ICD (implantable cardioverter-defibrillator) in place 06/08/2010  . Chronic systolic heart failure ([place]) 04/18/2010  . WEIGHT LOSS 11/01/2008  . TOBACCO ABUSE 09/06/2008  . Aneurysm of abdominal vessel (5.6 CM) 09/06/2008  . HLD (hyperlipidemia) 09/05/2008  . COUGH 04/08/2007  . PSA, INCREASED 04/08/2007  . OTHER ABNORMAL BLOOD CHEMISTRY 03/24/2007  . Adrenal nodule ([place]) 10/01/2006  . Anxiety state 10/01/2006  . HYPERTENSION 10/01/2006  . Coronary atherosclerosis 10/01/2006  . Cardiomyopathy, ischemic 10/01/2006  . COPD, severe ([place]) 10/01/2006  . HYPERGLYCEMIA 10/01/2006  . HEMATURIA, HX OF 10/01/2006    Past Surgical History:  Procedure Laterality Date  . ABDOMINAL AORTIC ENDOVASCULAR STENT GRAFT N/A 03/03/2017   Procedure: ABDOMINAL AORTIC ENDOVASCULAR STENT GRAFT;  Surgeon: Waynetta Sandy, MD;  Location: [place];  Service: Vascular;  Laterality: N/A;  . cataract surgery    . CORONARY ANGIOPLASTY WITH STENT PLACEMENT  2001  a. PCI to LAD  . IMPLANTABLE CARDIOVERTER DEFIBRILLATOR (ICD) GENERATOR CHANGE N/A 03/18/2014   a. SJM Fortify ST DR ICD implanted by [facility] for primary prevention b. gen change 03/2014   . LEAD REVISION/REPAIR N/A 07/17/2016   Procedure: Lead Revision/Repair;  Surgeon: Evans Lance, MD;  Location: [place] CV LAB;  Service: Cardiovascular;  Laterality:  N/A;  . LEFT HEART CATHETERIZATION WITH CORONARY ANGIOGRAM N/A 05/27/2014   Procedure: LEFT HEART CATHETERIZATION WITH CORONARY ANGIOGRAM;  Surgeon: Sherren Mocha, MD;  Location: [facility] CATH LAB;  Service: Cardiovascular;  Laterality: N/A;    Current Outpatient Rx  . Order #: [id number] Class: Historical Med  . Order #: [id number] Class: Normal  . Order #: [id number] Class: Normal  . Order #: [id number] Class: Normal  . Order #: [id number] Class: Normal  . Order #: [id number] Class: Historical Med  . Order #: [id number] Class: Historical Med  . Order #: [id number] Class: Normal  . Order #: [id number] Class: Normal  . Order #: [id number] Class: Normal    Allergies Patient has no known allergies.  Family History  Problem Relation Age of Onset  . Cirrhosis Mother        due to ETOH  . Cancer Neg Hx     Social History Social History   Tobacco Use  . Smoking status: Current Every Day Smoker    Types: Cigars  . Smokeless tobacco: Never Used  . Tobacco comment: smokes cigars now, no longer smokes cigarettes but previously smoked 2ppd  Substance Use Topics  . Alcohol use: No  . Drug use: No    Review of Systems  All other systems negative except as documented in the HPI. All pertinent positives and negatives as reviewed in the HPI. ____________________________________________   PHYSICAL EXAM:  VITAL SIGNS: ED Triage Vitals  Enc Vitals Group     BP 04/21/19 2303 (!) 142/82     Pulse Rate 04/21/19 2303 81     Resp 04/21/19 2303 16     Temp 04/21/19 2303 98.4 F (36.9 C)     Temp Source 04/21/19 2303 Oral     SpO2 04/21/19 2303 99 %    Constitutional: Alert and oriented. Well appearing and in no acute distress. Eyes: Conjunctivae are normal. PERRL. EOMI. Head: Atraumatic. Nose: No congestion/rhinnorhea. Mouth/Throat: Mucous membranes are moist.  Oropharynx non-erythematous. Neck: No stridor.  No meningeal signs.   Cardiovascular: Normal rate, regular rhythm. Good peripheral  circulation. Grossly normal heart sounds.   Respiratory: Normal respiratory effort.  No retractions. Lungs CTAB. Gastrointestinal: Soft and nontender. No distention.  Musculoskeletal: No lower extremity tenderness nor edema. No gross deformities of extremities. Neurologic:  Normal speech and language. No gross focal neurologic deficits are appreciated.  Skin:  Skin is warm, dry and intact. No rash noted.   ____________________________________________   LABS (all labs ordered are listed, but only abnormal results are displayed)  Labs Reviewed  BASIC METABOLIC PANEL - Abnormal; Notable for the following components:      Result Value   Glucose, Bld 127 (*)    All other components within normal limits  TROPONIN I (HIGH SENSITIVITY) - Abnormal; Notable for the following components:   Troponin I (High Sensitivity) 19 (*)    All other components within normal limits  CBC  MAGNESIUM  TROPONIN I (HIGH SENSITIVITY)   ____________________________________________  EKG   EKG Interpretation  Date/Time:  Wednesday April 21 2019 22:54:47 EDT Ventricular Rate:  81 PR Interval:  166 QRS Duration: 150 QT Interval:  428 QTC Calculation: 497 R Axis:   -  5 Text Interpretation: Sinus rhythm with sinus arrhythmia with occasional Premature ventricular complexes Right bundle branch block Inferior infarct , age undetermined Anterior infarct , age undetermined T wave abnormality, consider lateral ischemia Abnormal ECG new TWI in inferior leads Confirmed by Merrily Pew [phone]) on 04/22/2019 1:21:04 AM       ____________________________________________  RADIOLOGY  DG Chest 2 View  Result Date: 04/21/2019 CLINICAL DATA:  74 year old male with dizziness. EXAM: CHEST - 2 VIEW COMPARISON:  Chest radiograph dated 03/03/2017. FINDINGS: There is background of emphysema. No focal consolidation, pleural effusion or pneumothorax. Left lung base scarring. The cardiac silhouette is within normal limits.  Atherosclerotic calcification of the aorta. Left pectoral AICD device. No acute osseous pathology. IMPRESSION: 1. No acute cardiopulmonary process. 2. Emphysema. Electronically Signed   By: Anner Crete M.D.   On: 04/21/2019 23:17    ____________________________________________   PROCEDURES  Procedure(s) performed:   Procedures   ____________________________________________   INITIAL IMPRESSION / ASSESSMENT AND PLAN / ED COURSE  After interrogation seems that this rhythm was likely V. tach and was terminated with 1 shock.  Her troponin is slightly elevated but this could be just related to the fast rate/defibrillation.  His EKG is different than previous but nothing with acute coronary syndrome concerns.  At this time will discuss with cardiology regarding further work-up or need for observation in the hospital versus close outpatient follow-up.  Discussed with cardiologist. Will admit. Pt. Agreeable.   Pertinent labs & imaging results that were available during my care of the patient were reviewed by me and considered in my medical decision making (see chart for details).  ____________________________________________  FINAL CLINICAL IMPRESSION(S) / ED DIAGNOSES  Final diagnoses:  V-tach ([place])     MEDICATIONS GIVEN DURING THIS VISIT:  Medications  sodium chloride flush (NS) 0.9 % injection 3 mL (has no administration in time range)  magnesium sulfate IVPB 1 g 100 mL (has no administration in time range)     NEW OUTPATIENT MEDICATIONS STARTED DURING THIS VISIT:  New Prescriptions   No medications on file    Note:  This note was prepared with assistance of Dragon voice recognition software. Occasional wrong-word or sound-a-like substitutions may have occurred due to the inherent limitations of voice recognition software.   Audrielle Vankuren, Corene Cornea, MD 04/22/19 [phone]

## 2019-04-22 NOTE — ED Notes (Signed)
Attempted report, unsuccessful

## 2019-04-22 NOTE — Consult Note (Addendum)
Cardiology Consultation:   Patient ID: TICE MCMEANS MRN: [id number]; DOB: 03-08-1945  Admit date: 04/21/2019 Date of Consult: 04/22/2019  Primary Care Provider: Patient, No Pcp Per Primary Cardiologist: Kirk Ruths, MD  Primary Electrophysiologist:  Dr. Rayann Heman   Patient Profile:   Benjamin Cook is a 74 y.o. male with a hx of CAD (2001: 95% mid LAD and a 70% diffuse right coronary artery. His ejection fraction was 25-30%. He had PCI of his LAD at that time. Last cardiac catheterization April 2016 showed diffuse nonobstructive coronary disease and ejection fraction 35%), ICM w/ICD, AFib, HTN, HLD, AAA (s/p endovascular repair, Dr. Donzetta Matters), COPD who is being seen today for the evaluation of VT at the request of Dr. Clarnce Flock.  History of Present Illness:   Benjamin Cook last saw Dr. Rayann Heman via telehealth visit April 2020, was feeling well, mowing the grass, no complaints, noting normal remote transmissions, low AFib burden, no symptoms, no changes were made to his therapy He saw A. Tillery, PA in Feb, was doing well, QRS had broadened some, planned to update his echo, intact device function He saw Dr. [name] more recently, 04/13/2019, noted baseline DOE unchanged, no other symptoms, planned to stop his ACE and start Entresto after 48hrs with f/u labs  He was admitted to [facility] last night after being shocked by his device reportedly appropriate for VT in 200bpm range. H&P reports NO CP of late or prior to shock, did feel lightheaded pre-schock and well afterwards  LABS K+ 4.2 Mag 1.9 BUN/CReat 8/0.97 HS Trop 19 > 20 WBC 9.8 H/H 15/46 Plts 202  CXR w/NAP  COVID negative  The patient has felt well of late, no CP, no exertional intolerances.  He reports himself as active, busy, no exercise, and no DOE with usual daily activities.  NO dizzy spells, near syncope or syncope until yesterday  He was oon the toilet started feeling lightheaded and then then was shocked, he did not faint,  did not have any CP, palpitations, felt well after the shock. He reports compliance with his medicines, no missed doses.  The Delene Loll is now, but labs looks OK  Device information SJM dual chamber ICD,  Implanted 2012, gen change 03/18/2014 Has an abandoned RV lead (fracture), new lead placed 2018  The patient tells me he has been shocked byhis device on a couple of occassions over the years, can not recall specifics I do not see mention in notes of any prior h/o appropriate or inaapropriate shocks (even with his prior RV lead failure)  Device transmission Battery and lead measurements are OK 04/21/2019 @ 9:31PM VT episode This was a true MMVT, 203bpm, one ATP delivered failed, , one HV therapy, 25J with clean break CorVue is below threshold, but not  far  I note an AMS episode from March 11, is noise on the A lead (this has been observed on prior in clinic and remote when I review them), lead impedance and measurements have been stable. AMS burden is <1%   Past Medical History:  Diagnosis Date  . Adrenal mass ([place])    per pt this is remote (10 years) and benign by biopsy  . AICD (automatic cardioverter/defibrillator) present   . Anxiety   . Arthritis   . CAD (coronary artery disease)    a. s/p PCI to LAD 2001 b. myoview 2014 high risk with scar LAD/RCA territory but no ischemia  . Cardiomyopathy, ischemic   . CHF (congestive heart failure) ([name])    class  II/III  . COPD (chronic obstructive pulmonary disease) ([name])    smokes cigars but has quit cigarettes  . Dyspnea   . Flu 03/2015  . HTN (hypertension)   . Hyperlipidemia   . NSVT (nonsustained ventricular tachycardia) ([place]) 03/05/2017  . Paroxysmal atrial fibrillation ([name]) 03/2015   chads2vasc score is 4  . PSA (psoriatic arthritis) ([place])    increased  . PVD (peripheral vascular disease) ([place])     Past Surgical History:  Procedure Laterality Date  . ABDOMINAL AORTIC ENDOVASCULAR STENT GRAFT N/A 03/03/2017   Procedure:  ABDOMINAL AORTIC ENDOVASCULAR STENT GRAFT;  Surgeon: Waynetta Sandy, MD;  Location: [place];  Service: Vascular;  Laterality: N/A;  . cataract surgery    . CORONARY ANGIOPLASTY WITH STENT PLACEMENT  2001   a. PCI to LAD  . IMPLANTABLE CARDIOVERTER DEFIBRILLATOR (ICD) GENERATOR CHANGE N/A 03/18/2014   a. SJM Fortify ST DR ICD implanted by [facility] for primary prevention b. gen change 03/2014   . LEAD REVISION/REPAIR N/A 07/17/2016   Procedure: Lead Revision/Repair;  Surgeon: Evans Lance, MD;  Location: [place] [facility];  Service: Cardiovascular;  Laterality: N/A;  . LEFT HEART CATHETERIZATION WITH CORONARY ANGIOGRAM N/A 05/27/2014   Procedure: LEFT HEART CATHETERIZATION WITH CORONARY ANGIOGRAM;  Surgeon: Sherren Mocha, MD;  Location: [facility] CATH LAB;  Service: Cardiovascular;  Laterality: N/A;     Home Medications:  Prior to Admission medications   Medication Sig Start Date End Date Taking? Authorizing Provider  acetaminophen (TYLENOL) 500 MG tablet Take 1,000 mg by mouth daily as needed (back pain).    [provider]  atorvastatin (LIPITOR) 80 MG tablet Take 1 tablet (80 mg total) by mouth daily at 6 PM. Please schedule annual appt with Dr. [name] for refills. [phone]. 1st attempt. 04/07/19   Lelon Perla, MD  carvedilol (COREG) 25 MG tablet TAKE 1 TABLET BY MOUTH  TWICE DAILY WITH A MEAL 10/09/18   Allred, Jeneen Rinks, MD  Fluticasone-Salmeterol (ADVAIR) 100-50 MCG/DOSE AEPB Inhale 1 puff into the lungs 2 (two) times daily. 07/30/17   Renato Shin, MD  furosemide (LASIX) 40 MG tablet TAKE ONE-HALF TABLET BY  MOUTH DAILY 10/09/18   Allred, Jeneen Rinks, MD  Multiple Vitamin (MULTIVITAMIN) capsule Take 1 capsule by mouth daily.      [provider]  Omega-3 Fatty Acids (FISH OIL) 1200 MG CAPS Take 1,200 mg by mouth daily.    [provider]  potassium chloride SA (KLOR-CON) 20 MEQ tablet Take 1 tablet (20 mEq total) by mouth daily. 01/21/19   Lelon Perla, MD   sacubitril-valsartan (ENTRESTO) 49-51 MG Take 1 tablet by mouth 2 (two) times daily. 04/13/19   Lelon Perla, MD  XARELTO 20 MG TABS tablet TAKE 1 TABLET (20 MG TOTAL) BY MOUTH DAILY WITH SUPPER. 04/01/19   Lelon Perla, MD    Inpatient Medications: Scheduled Meds: . atorvastatin  80 mg Oral q1800  . carvedilol  25 mg Oral BID WC  . furosemide  20 mg Oral Daily  . mometasone-formoterol  2 puff Inhalation BID  . rivaroxaban  20 mg Oral Q supper  . sacubitril-valsartan  1 tablet Oral BID  . sodium chloride flush  3 mL Intravenous Q12H   Continuous Infusions: . sodium chloride     PRN Meds: sodium chloride, acetaminophen, sodium chloride flush  Allergies:   No Known Allergies  Social History:   Social History   Socioeconomic History  . Marital status: Married    Spouse name:  Not on file  . Number of children: Not on file  . Years of education: Not on file  . Highest education level: Not on file  Occupational History  . Occupation: Lawn care  Tobacco Use  . Smoking status: Current Every Day Smoker    Types: Cigars  . Smokeless tobacco: Never Used  . Tobacco comment: smokes cigars now, no longer smokes cigarettes but previously smoked 2ppd  Substance and Sexual Activity  . Alcohol use: No  . Drug use: No  . Sexual activity: Not Currently    Partners: Female    Birth control/protection: None  Other Topics Concern  . Not on file  Social History Narrative  . Not on file   Social Determinants of Health   Financial Resource Strain:   . Difficulty of Paying Living Expenses:   Food Insecurity:   . Worried About Charity fundraiser in the Last Year:   . Arboriculturist in the Last Year:   Transportation Needs:   . Film/video editor (Medical):   Marland Kitchen Lack of Transportation (Non-Medical):   Physical Activity:   . Days of Exercise per Week:   . Minutes of Exercise per Session:   Stress:   . Feeling of Stress :   Social Connections:   . Frequency of  Communication with Friends and Family:   . Frequency of Social Gatherings with Friends and Family:   . Attends Religious Services:   . Active Member of Clubs or Organizations:   . Attends Archivist Meetings:   Marland Kitchen Marital Status:   Intimate Partner Violence:   . Fear of Current or Ex-Partner:   . Emotionally Abused:   Marland Kitchen Physically Abused:   . Sexually Abused:     Family History:   Family History  Problem Relation Age of Onset  . Cirrhosis Mother        due to ETOH  . Cancer Neg Hx      ROS:  Please see the history of present illness.  All other ROS reviewed and negative.     Physical Exam/Data:   Vitals:   04/22/19 0500 04/22/19 0600 04/22/19 0622 04/22/19 0843  BP: 136/77 (!) 141/72 124/88 (!) 115/58  Pulse: 70 67 78 62  Resp: (!) 22 20 (!) 23 20  Temp:   97.7 F (36.5 C) 97.7 F (36.5 C)  TempSrc:   Oral Oral  SpO2: 95% 95% 95% 95%  Weight:   63.3 kg   Height:   5\' 11"  (1.803 m)    No intake or output data in the 24 hours ending 04/22/19 1007 Last 3 Weights 04/22/2019 04/13/2019 03/12/2019  Weight (lbs) 139 lb 9.6 oz 147 lb 149 lb  Weight (kg) 63.322 kg 66.679 kg 67.586 kg     Body mass index is 19.47 kg/m.  General:  Well nourished, thin, though well developed, in no acute distress HEENT: normal Lymph: no adenopathy Neck: no JVD Endocrine:  No thryomegaly Vascular: No carotid bruits Cardiac:  RRR; no murmurs, gallops or rubs Lungs: CTA b/l, no wheezing, rhonchi or rales  Abd: soft, nontender Ext: no edema Musculoskeletal:  No deformities, age appropriate, perhaps advanced atrophy Skin: warm and dry  Neuro:   No gross focal abnormalities noted Psych:  Normal affect   EKG:  The EKG was personally reviewed and demonstrates:   SR 81bpm, RBBB, PVC, no notable change from prior  Telemetry:  Telemetry was personally reviewed and demonstrates:   SR, some A  pacing, rare PVC, couplet  Relevant CV Studies:  04/09/2019: TTE IMPRESSIONS  1. Left  ventricular ejection fraction, by estimation, is 35%%. The left  ventricle has moderate to severely decreased function. There is mild left  ventricular hypertrophy. Left ventricular diastolic parameters are  consistent with Grade I diastolic  dysfunction (impaired relaxation).  2. Right ventricular systolic function is normal. The right ventricular  size is normal.  3. The mitral valve is normal in structure and function. Trivial mitral  valve regurgitation.  4. The aortic valve is abnormal. Aortic valve regurgitation is trivial.  Mild aortic valve sclerosis is present, with no evidence of aortic valve  stenosis.  5. The inferior vena cava is normal in size with greater than 50%  respiratory variability, suggesting right atrial pressure of 3 mmHg.   Laboratory Data:  High Sensitivity Troponin:   Recent Labs  Lab 04/21/19 2307 04/22/19 0057  TROPONINIHS 19* 20*     Chemistry Recent Labs  Lab 04/21/19 2307  NA 139  K 4.2  CL 103  CO2 26  GLUCOSE 127*  BUN 8  CREATININE 0.97  CALCIUM 9.4  GFRNONAA >60  GFRAA >60  ANIONGAP 10    No results for input(s): PROT, ALBUMIN, AST, ALT, ALKPHOS, BILITOT in the last 168 hours. Hematology Recent Labs  Lab 04/21/19 2307  WBC 9.8  RBC 5.04  HGB 15.4  HCT 46.5  MCV 92.3  MCH 30.6  MCHC 33.1  RDW 15.1  PLT 202   BNPNo results for input(s): BNP, PROBNP in the last 168 hours.  DDimer No results for input(s): DDIMER in the last 168 hours.   Radiology/Studies:   DG Chest 2 View Result Date: 04/21/2019 CLINICAL DATA:  74 year old male with dizziness. EXAM: CHEST - 2 VIEW COMPARISON:  Chest radiograph dated 03/03/2017. FINDINGS: There is background of emphysema. No focal consolidation, pleural effusion or pneumothorax. Left lung base scarring. The cardiac silhouette is within normal limits. Atherosclerotic calcification of the aorta. Left pectoral AICD device. No acute osseous pathology. IMPRESSION: 1. No acute  cardiopulmonary process. 2. Emphysema. Electronically Signed   By: Anner Crete M.D.   On: 04/21/2019 23:17   {    Assessment and Plan:   1. MMVT with appropriate shock from his device     + lightheaded, no syncope  no driving, [place] law was discussed with the patient  2. CAD     No symptoms of angina     HS trop do not suggest ACS     No notable change in his EKG  3. ICM     corVue is below threshold     No symptoms, exam, or CXR  Findings to suggest volume OL     On good medicine regime  4. Paroxysmal AFib     CHA2DS2Vasc is 5, on Xarelto     <1% burden  5. ICD     She has some noise on his A lead, this is not new in review of his prior checks     Lead measurements have been stable  He is on coreg 25mg  BID, will add amiodarone. Dr. Lovena Le to see later today   For questions or updates, please contact [facility] Please consult [email or web address] for contact info under   Signed, Baldwin Jamaica, PA-C  04/22/2019 10:07 AM  EP Attending  Patient seen and examined. Agree with the findings as noted above. However, I would suggest that in the setting of a single ICD shock, we avoid initiating  amiodarone. Delene Loll has been shown in observational studies to reduce ICD therapies. We will add ATP therapies in hopes of successful ATP (he has only a single spin programmed) and continue his current meds. Ok to DC home if no other issues.   Mikle Bosworth.D.

## 2019-04-22 NOTE — Discharge Instructions (Signed)
NO DRIVING 6 MONTHS

## 2019-04-22 NOTE — H&P (Signed)
CARDIOLOGY ADMISSION NOTE  Patient ID: Benjamin Cook MRN: [id number] DOB/AGE: 1945/12/24 74 y.o.  Admit date: 04/21/2019 Primary Cardiologist   Kirk Ruths, MD Chief Complaint Defibrillator discharge  HPI:   Benjamin Cook is a 74 year old gentleman with CAD, ischemic cardiomyopathy s/p ICD (EF 35% on 04/09/19), NSVT, PAD, paroxysmal atrial fibrillation (on rivaroxaban), HTN, HLD, and COPD who presents from home after ICD discharge.  Patient reports that he was in his usual state of health.  He states that he may have been stressed, but denies any chest pain and no worse shortness of breath from his baseline.  The evening of his presentation, he was sitting on the toilet and suddenly felt very lightheaded and several seconds later felt his defibrillator discharge.  After the defibrillator went off, patient reports that he felt improved from a symptom standpoint and back to his baseline.  His wife called the cardiology hotline and was directed to the ED for further evaluation.  In the ED, patient was reportedly back to his baseline and felt well.  He was afebrile with BP 140/70, heart rate 67, satting 99% on room air.  ECG showed PVCs, but no dynamic ST changes.  Labs were largely unremarkable, except for an elevated troponin of 19 -> 20.  Patient's device was interrogated and according to the device rep was appropriately shocked for sustained VT at a rate of about 200 (per ED physician). Patient denies any localizing signs or symptoms of infection.  States the last time that his defibrillator discharge was over 3 years ago.    Past Medical History:  Diagnosis Date  . Adrenal mass ([place])    per pt this is remote (10 years) and benign by biopsy  . AICD (automatic cardioverter/defibrillator) present   . Anxiety   . Arthritis   . CAD (coronary artery disease)    a. s/p PCI to LAD 2001 b. myoview 2014 high risk with scar LAD/RCA territory but no ischemia  . Cardiomyopathy, ischemic   .  CHF (congestive heart failure) ([name])    class II/III  . COPD (chronic obstructive pulmonary disease) ([name])    smokes cigars but has quit cigarettes  . Dyspnea   . Flu 03/2015  . HTN (hypertension)   . Hyperlipidemia   . NSVT (nonsustained ventricular tachycardia) ([place]) 03/05/2017  . Paroxysmal atrial fibrillation ([name]) 03/2015   chads2vasc score is 4  . PSA (psoriatic arthritis) ([place])    increased  . PVD (peripheral vascular disease) ([place])     Past Surgical History:  Procedure Laterality Date  . ABDOMINAL AORTIC ENDOVASCULAR STENT GRAFT N/A 03/03/2017   Procedure: ABDOMINAL AORTIC ENDOVASCULAR STENT GRAFT;  Surgeon: Waynetta Sandy, MD;  Location: [place];  Service: Vascular;  Laterality: N/A;  . cataract surgery    . CORONARY ANGIOPLASTY WITH STENT PLACEMENT  2001   a. PCI to LAD  . IMPLANTABLE CARDIOVERTER DEFIBRILLATOR (ICD) GENERATOR CHANGE N/A 03/18/2014   a. SJM Fortify ST DR ICD implanted by [facility] for primary prevention b. gen change 03/2014   . LEAD REVISION/REPAIR N/A 07/17/2016   Procedure: Lead Revision/Repair;  Surgeon: Evans Lance, MD;  Location: [place] [facility];  Service: Cardiovascular;  Laterality: N/A;  . LEFT HEART CATHETERIZATION WITH CORONARY ANGIOGRAM N/A 05/27/2014   Procedure: LEFT HEART CATHETERIZATION WITH CORONARY ANGIOGRAM;  Surgeon: Sherren Mocha, MD;  Location: [facility];  Service: Cardiovascular;  Laterality: N/A;    No Known Allergies No current facility-administered medications on file prior  to encounter.   Current Outpatient Medications on File Prior to Encounter  Medication Sig Dispense Refill  . acetaminophen (TYLENOL) 500 MG tablet Take 1,000 mg by mouth daily as needed (back pain).    Marland Kitchen atorvastatin (LIPITOR) 80 MG tablet Take 1 tablet (80 mg total) by mouth daily at 6 PM. Please schedule annual appt with Dr. [name] for refills. [phone]. 1st attempt. 30 tablet 0  . carvedilol (COREG) 25 MG tablet TAKE 1 TABLET BY MOUTH  TWICE  DAILY WITH A MEAL 180 tablet 1  . Fluticasone-Salmeterol (ADVAIR) 100-50 MCG/DOSE AEPB Inhale 1 puff into the lungs 2 (two) times daily. 1 each 3  . furosemide (LASIX) 40 MG tablet TAKE ONE-HALF TABLET BY  MOUTH DAILY 45 tablet 1  . Multiple Vitamin (MULTIVITAMIN) capsule Take 1 capsule by mouth daily.      . Omega-3 Fatty Acids (FISH OIL) 1200 MG CAPS Take 1,200 mg by mouth daily.    . potassium chloride SA (KLOR-CON) 20 MEQ tablet Take 1 tablet (20 mEq total) by mouth daily. 90 tablet 0  . sacubitril-valsartan (ENTRESTO) 49-51 MG Take 1 tablet by mouth 2 (two) times daily. 60 tablet 12  . XARELTO 20 MG TABS tablet TAKE 1 TABLET (20 MG TOTAL) BY MOUTH DAILY WITH SUPPER. 90 tablet 1   Social History   Socioeconomic History  . Marital status: Married    Spouse name: Not on file  . Number of children: Not on file  . Years of education: Not on file  . Highest education level: Not on file  Occupational History  . Occupation: Lawn care  Tobacco Use  . Smoking status: Current Every Day Smoker    Types: Cigars  . Smokeless tobacco: Never Used  . Tobacco comment: smokes cigars now, no longer smokes cigarettes but previously smoked 2ppd  Substance and Sexual Activity  . Alcohol use: No  . Drug use: No  . Sexual activity: Not Currently    Partners: Female    Birth control/protection: None  Other Topics Concern  . Not on file  Social History Narrative  . Not on file   Social Determinants of Health   Financial Resource Strain:   . Difficulty of Paying Living Expenses:   Food Insecurity:   . Worried About Charity fundraiser in the Last Year:   . Arboriculturist in the Last Year:   Transportation Needs:   . Film/video editor (Medical):   Marland Kitchen Lack of Transportation (Non-Medical):   Physical Activity:   . Days of Exercise per Week:   . Minutes of Exercise per Session:   Stress:   . Feeling of Stress :   Social Connections:   . Frequency of Communication with Friends and Family:    . Frequency of Social Gatherings with Friends and Family:   . Attends Religious Services:   . Active Member of Clubs or Organizations:   . Attends Archivist Meetings:   Marland Kitchen Marital Status:   Intimate Partner Violence:   . Fear of Current or Ex-Partner:   . Emotionally Abused:   Marland Kitchen Physically Abused:   . Sexually Abused:     Family History  Problem Relation Age of Onset  . Cirrhosis Mother        due to ETOH  . Cancer Neg Hx      Review of Systems: [y] = yes, [ ]  = no       General: Weight gain [] ; Weight loss [ ] ;  Anorexia [ ] ; Fatigue [ ] ; Fever [ ] ; Chills [ ] ; Weakness [ ]     Cardiac: Chest pain/pressure [ ] ; Resting SOB [ ] ; Exertional SOB [ ] ; Orthopnea [ ] ; Pedal Edema [ ] ; Palpitations [ ] ; Syncope [ ] ; Presyncope [ ] ; Paroxysmal nocturnal dyspnea[ ]     Pulmonary: Cough [ ] ; Wheezing[ ] ; Hemoptysis[ ] ; Sputum [ ] ; Snoring [ ]     GI: Vomiting[ ] ; Dysphagia[ ] ; Melena[ ] ; Hematochezia [ ] ; Heartburn[ ] ; Abdominal pain [ ] ; Constipation [ ] ; Diarrhea [ ] ; BRBPR [ ]     GU: Hematuria[ ] ; Dysuria [ ] ; Nocturia[ ]   Vascular: Pain in legs with walking [ ] ; Pain in feet with lying flat [ ] ; Non-healing sores [ ] ; Stroke [ ] ; TIA [ ] ; Slurred speech [ ] ;    Neuro: Headaches[ ] ; Vertigo[ ] ; Seizures[ ] ; Paresthesias[ ] ;Blurred vision [ ] ; Diplopia [ ] ; Vision changes [ ]     Ortho/Skin: Arthritis [ ] ; Joint pain [ ] ; Muscle pain [ ] ; Joint swelling [ ] ; Back Pain [ ] ; Rash [ ]     Psych: Depression[ ] ; Anxiety[ ]     Heme: Bleeding problems [ ] ; Clotting disorders [ ] ; Anemia [ ]     Endocrine: Diabetes [ ] ; Thyroid dysfunction[ ]   Physical Exam: Blood pressure (!) 143/86, pulse 99, temperature 98 F (36.7 C), resp. rate 18, SpO2 97 %.   GENERAL: Patient is afebrile, Vital signs reviewed, Well appearing, Patient appears comfortable, Alert and lucid. EYES: Normal inspection. HEENT:  normocephalic, atraumatic. NECK:  supple , normal inspection. CARD:  regular rate  and rhythm, heart sounds normal, JVD flat RESP:  no respiratory distress, breath sounds normal. ABD: soft, nontender to palpation, BS present MUSC:  normal ROM, non-tender , no pedal edema . SKIN: color normal, no rash, warm, dry  NEURO: awake & alert, lucid, no motor/sensory deficit. Gait stable. PSYCH: mood/affect normal.   Labs: Lab Results  Component Value Date   BUN 8 04/21/2019   Lab Results  Component Value Date   CREATININE 0.97 04/21/2019   Lab Results  Component Value Date   NA 139 04/21/2019   K 4.2 04/21/2019   CL 103 04/21/2019   CO2 26 04/21/2019   Lab Results  Component Value Date   TROPONINI (H) 05/26/2010    0.07        PERSISTENTLY INCREASED TROPONIN VALUES IN THE RANGE OF 0.06-0.49 ng/mL CAN BE SEEN IN:       -UNSTABLE ANGINA       -CONGESTIVE HEART FAILURE       -MYOCARDITIS       -CHEST TRAUMA       -ARRYHTHMIAS       -LATE PRESENTING MI       -COPD   CLINICAL FOLLOW-UP RECOMMENDED.   Lab Results  Component Value Date   WBC 9.8 04/21/2019   HGB 15.4 04/21/2019   HCT 46.5 04/21/2019   MCV 92.3 04/21/2019   PLT 202 04/21/2019   Lab Results  Component Value Date   CHOL 114 12/09/2016   HDL 32.10 (L) 12/09/2016   LDLCALC 61 12/09/2016   TRIG 104.0 12/09/2016   CHOLHDL 4 12/09/2016   Lab Results  Component Value Date   ALT 13 (L) 02/25/2017   AST 20 02/25/2017   ALKPHOS 69 02/25/2017   BILITOT 0.7 02/25/2017      Radiology:   CXR:    IMPRESSION: 1. No acute cardiopulmonary process. 2. Emphysema.   ASSESSMENT AND  PLAN:   Mr. Keita is a 74 year old gentleman with CAD, ischemic cardiomyopathy s/p ICD (EF 35% on 04/09/19), NSVT, PAD, paroxysmal atrial fibrillation (on rivaroxaban), HTN, HLD, and COPD who presents from home after ICD discharge.  # ICD Discharge # VT # CAD # ICM :: No clear trigger at this time. No evidence of electrolyte imbalances, myocardial ischemia, hypoxia, adverse drug effects, anemia, hypotension, or  heart failure. Patient is currently asymptomatic. Will admit to observation to expedite evaluation by EP team and device interrogation for burden of VT and consideration for antiarrhythmic drug therapy if indicated. Do not feel that patient needs an ischemic evaluation at this time, but will keep NPO just in case. Mild troponin elevation likely in setting of being shocked.  - Admit to Obs - Continue home GMDT (Carvedilol, Entresto) - Monitor on telemetry - Replete and maintain electrolytes  # Paroxysmal AF - Cont Rivaroxaban  # HTN  # HLD - Cont GDMT as above  #COPD - Cont inhaler therapy (substition made for patient's nonformulary Advair)    Signed: Clois Dupes 04/22/2019, 3:26 AM

## 2019-04-27 ENCOUNTER — Ambulatory Visit (INDEPENDENT_AMBULATORY_CARE_PROVIDER_SITE_OTHER): Payer: Medicare Other | Admitting: *Deleted

## 2019-04-27 DIAGNOSIS — I480 Paroxysmal atrial fibrillation: Secondary | ICD-10-CM | POA: Diagnosis not present

## 2019-04-28 ENCOUNTER — Telehealth: Payer: Self-pay

## 2019-04-28 ENCOUNTER — Ambulatory Visit (INDEPENDENT_AMBULATORY_CARE_PROVIDER_SITE_OTHER): Payer: Medicare Other

## 2019-04-28 DIAGNOSIS — Z95810 Presence of automatic (implantable) cardiac defibrillator: Secondary | ICD-10-CM | POA: Diagnosis not present

## 2019-04-28 DIAGNOSIS — I5022 Chronic systolic (congestive) heart failure: Secondary | ICD-10-CM

## 2019-04-28 LAB — CUP PACEART REMOTE DEVICE CHECK
Battery Remaining Longevity: 46 mo
Battery Remaining Percentage: 47 %
Battery Voltage: 2.96 V
Brady Statistic AP VP Percent: 1 %
Brady Statistic AP VS Percent: 21 %
Brady Statistic AS VP Percent: 1 %
Brady Statistic AS VS Percent: 74 %
Brady Statistic RA Percent Paced: 13 %
Brady Statistic RV Percent Paced: 1 %
Date Time Interrogation Session: 20210323165501
HighPow Impedance: 72 Ohm
HighPow Impedance: 72 Ohm
Implantable Lead Implant Date: 20120420
Implantable Lead Implant Date: 20180613
Implantable Lead Location: 753859
Implantable Lead Location: 753860
Implantable Pulse Generator Implant Date: 20160212
Lead Channel Impedance Value: 380 Ohm
Lead Channel Impedance Value: 550 Ohm
Lead Channel Pacing Threshold Amplitude: 0.5 V
Lead Channel Pacing Threshold Amplitude: 0.75 V
Lead Channel Pacing Threshold Pulse Width: 0.5 ms
Lead Channel Pacing Threshold Pulse Width: 0.5 ms
Lead Channel Sensing Intrinsic Amplitude: 11.6 mV
Lead Channel Sensing Intrinsic Amplitude: 2.7 mV
Lead Channel Setting Pacing Amplitude: 2 V
Lead Channel Setting Pacing Amplitude: 2.5 V
Lead Channel Setting Pacing Pulse Width: 0.5 ms
Lead Channel Setting Sensing Sensitivity: 0.5 mV
Pulse Gen Serial Number: 7225079

## 2019-04-28 NOTE — Telephone Encounter (Signed)
ICD alert  Received- Pt had VT episode 3/23 ~6pm, ATP x3 unsuccessful, terminated with shock.  Pt was hospitalized 3/17-3/18 for ICD discharge.  Current meds include:  Carvedilol 25mg  BID, Entresto 49-51mg  BID, Xarelto 20mg  daily; Lasix 20mg  daily and Potassium 20 mEq daily.   Spoke with pt, he had just finished eating and was not feeling well so he went to lie down.  He reports he had a stressful conversation with a family member, when he felt dizzy just prior to receiving shock.  Pt confirms compliance with meds prior to shock, however he did skip Entresto dose last night as he was concerned this was cause of shock since he only started it a couple weeks ago.  Following ICD discharge, pt reports he felt better, currently has no cardiac complaints.   Shock response plan reviewed with pt.  Also advised pt of DMV driving restriction for 6 months following treated episode.  Pt v/u of info provided and to continue with current meds unless directed otherwise.   Forwarding to MD for review.  Pt currently has appt scheduled with Tommye Standard, PA on 05/13/19.

## 2019-04-28 NOTE — Telephone Encounter (Signed)
Remote ICM transmission received.  Attempted call to patient regarding ICM remote transmission and no answer or answering machine. 

## 2019-04-28 NOTE — Progress Notes (Signed)
EPIC Encounter for ICM Monitoring  Patient Name: NOBORU BIDINGER is a 74 y.o. male Date: 04/28/2019 Primary Care Physican: Patient, No Pcp Per Primary Cardiologist:Crenshaw Electrophysiologist: Allred Lastweight:147 lbs   Attempted call to patient and unable to reach.  Transmission reviewed.  Device clinic has addressed VT episode 3/23 that was terminated with shock and hospitalized 3/17-3/18 for ICD discharge.   CorVue thoracic impedancesuggestive of possible fluid accumulation starting 04/23/2019 but trending back close to baseline normal.  Prescribed:Furosemide 40 mg0.5tablet (20 mg total)daily. Potassium 20 mEq 1 tablet daily.  Labs: 04/21/2019 Creatinine 0.07, BUN 8, Potassium 4.2, Sodium 139, GFR >60 03/12/2019 Creatinine 0.94, BUN 7, Potassium 4.1, Sodium 139, EGFR 79-81  Recommendations: Unable to reach.    Follow-up plan: ICM clinic phone appointment on 05/06/2019 to recheck fluid levels.   91 day device clinic remote transmission 07/27/2019.  Office appt 05/13/2019 with Tommye Standard, PA.    Copy of ICM check sent to Dr. Rayann Heman and Dr [name].   3 month ICM trend: 04/28/2019    1 Year ICM trend:       [name], RN 04/28/2019 12:45 PM

## 2019-05-04 ENCOUNTER — Other Ambulatory Visit: Payer: Self-pay | Admitting: Internal Medicine

## 2019-05-04 ENCOUNTER — Other Ambulatory Visit: Payer: Self-pay | Admitting: Cardiology

## 2019-05-06 ENCOUNTER — Ambulatory Visit (INDEPENDENT_AMBULATORY_CARE_PROVIDER_SITE_OTHER): Payer: Medicare Other

## 2019-05-06 DIAGNOSIS — I5022 Chronic systolic (congestive) heart failure: Secondary | ICD-10-CM

## 2019-05-06 DIAGNOSIS — Z95810 Presence of automatic (implantable) cardiac defibrillator: Secondary | ICD-10-CM

## 2019-05-06 NOTE — Progress Notes (Signed)
Electrophysiology Office Note Date: 05/07/2019  ID:  Benjamin Cook, DOB February 23, 1945, MRN [id number]  PCP: Patient, No Pcp Per Primary Cardiologist: Kirk Ruths, MD Electrophysiologist: Dr. Rayann Heman  CC: Routine ICD follow-up  Benjamin Cook is a 74 y.o. male seen today for Dr. Rayann Heman.  They present today for post hospital follow up for ICD shock. He has been doing well since. No further shock. No NSVT noted on device. He has been taking medications as directed. He denies chest pain, dizziness, lightheadedness, palpitations, nausea, vomiting, diarrhea, or recent illness. No   Device History: St. Jude Dual Chamber ICD implanted 2016, lead revision  for 07/2016 History of appropriate therapy: Yes History of AAD therapy: No   Past Medical History:  Diagnosis Date  . Adrenal mass ([place])    per pt this is remote (10 years) and benign by biopsy  . AICD (automatic cardioverter/defibrillator) present   . Anxiety   . Arthritis   . CAD (coronary artery disease)    a. s/p PCI to LAD 2001 b. myoview 2014 high risk with scar LAD/RCA territory but no ischemia  . Cardiomyopathy, ischemic   . CHF (congestive heart failure) ([name])    class II/III  . COPD (chronic obstructive pulmonary disease) ([name])    smokes cigars but has quit cigarettes  . Defibrillator discharge 04/21/2019  . Dyspnea   . Flu 03/2015  . HTN (hypertension)   . Hyperlipidemia   . NSVT (nonsustained ventricular tachycardia) ([place]) 03/05/2017  . Paroxysmal atrial fibrillation ([name]) 03/2015   chads2vasc score is 4  . PSA (psoriatic arthritis) ([place])    increased  . PVD (peripheral vascular disease) ([place])    Past Surgical History:  Procedure Laterality Date  . ABDOMINAL AORTIC ENDOVASCULAR STENT GRAFT N/A 03/03/2017   Procedure: ABDOMINAL AORTIC ENDOVASCULAR STENT GRAFT;  Surgeon: Waynetta Sandy, MD;  Location: [facility];  Service: Vascular;  Laterality: N/A;  . cataract surgery    . CORONARY ANGIOPLASTY WITH  STENT PLACEMENT  2001   a. PCI to LAD  . IMPLANTABLE CARDIOVERTER DEFIBRILLATOR (ICD) GENERATOR CHANGE N/A 03/18/2014   a. SJM Fortify ST DR ICD implanted by [facility] for primary prevention b. gen change 03/2014   . LEAD REVISION/REPAIR N/A 07/17/2016   Procedure: Lead Revision/Repair;  Surgeon: Evans Lance, MD;  Location: [facility];  Service: Cardiovascular;  Laterality: N/A;  . LEFT HEART CATHETERIZATION WITH CORONARY ANGIOGRAM N/A 05/27/2014   Procedure: LEFT HEART CATHETERIZATION WITH CORONARY ANGIOGRAM;  Surgeon: Sherren Mocha, MD;  Location: [facility] CATH LAB;  Service: Cardiovascular;  Laterality: N/A;    Current Outpatient Medications  Medication Sig Dispense Refill  . acetaminophen (TYLENOL) 500 MG tablet Take 500-1,000 mg by mouth every 8 (eight) hours as needed (for pain).     Marland Kitchen atorvastatin (LIPITOR) 80 MG tablet Take 1 tablet (80 mg total) by mouth daily at 6 PM. Please schedule annual appt with Dr. [name] for refills. [phone]. 1st attempt. (Patient taking differently: Take 80 mg by mouth daily at 6 PM. ) 30 tablet 0  . carvedilol (COREG) 25 MG tablet TAKE 1 TABLET BY MOUTH  TWICE DAILY WITH MEALS 180 tablet 3  . Fluticasone-Salmeterol (ADVAIR) 100-50 MCG/DOSE AEPB Inhale 1 puff into the lungs 2 (two) times daily. (Patient taking differently: Inhale 1 puff into the lungs 2 (two) times daily as needed (for flares). ) 1 each 3  . furosemide (LASIX) 40 MG tablet TAKE ONE-HALF TABLET BY  MOUTH DAILY 45 tablet 3  .  ibuprofen (ADVIL) 200 MG tablet Take 400 mg by mouth every 6 (six) hours as needed for headache or mild pain.    . Multiple Vitamin (MULTIVITAMIN) capsule Take 1 capsule by mouth daily.      . Omega-3 Fatty Acids (FISH OIL) 1200 MG CAPS Take 1,200 mg by mouth daily.    . potassium chloride SA (KLOR-CON) 20 MEQ tablet TAKE 1 TABLET BY MOUTH  DAILY 90 tablet 3  . sacubitril-valsartan (ENTRESTO) 49-51 MG Take 1 tablet by mouth 2 (two) times daily. 60 tablet 12  . XARELTO 20  MG TABS tablet TAKE 1 TABLET (20 MG TOTAL) BY MOUTH DAILY WITH SUPPER. (Patient taking differently: Take 20 mg by mouth at bedtime. ) 90 tablet 1   No current facility-administered medications for this visit.    Allergies:   Patient has no known allergies.   Social History: Social History   Socioeconomic History  . Marital status: Married    Spouse name: Not on file  . Number of children: Not on file  . Years of education: Not on file  . Highest education level: Not on file  Occupational History  . Occupation: Lawn care  Tobacco Use  . Smoking status: Current Every Day Smoker    Types: Cigars  . Smokeless tobacco: Never Used  . Tobacco comment: smokes cigars now, no longer smokes cigarettes but previously smoked 2ppd  Substance and Sexual Activity  . Alcohol use: No  . Drug use: No  . Sexual activity: Not Currently    Partners: Female    Birth control/protection: None  Other Topics Concern  . Not on file  Social History Narrative  . Not on file   Social Determinants of Health   Financial Resource Strain:   . Difficulty of Paying Living Expenses:   Food Insecurity:   . Worried About Charity fundraiser in the Last Year:   . Arboriculturist in the Last Year:   Transportation Needs:   . Film/video editor (Medical):   Marland Kitchen Lack of Transportation (Non-Medical):   Physical Activity:   . Days of Exercise per Week:   . Minutes of Exercise per Session:   Stress:   . Feeling of Stress :   Social Connections:   . Frequency of Communication with Friends and Family:   . Frequency of Social Gatherings with Friends and Family:   . Attends Religious Services:   . Active Member of Clubs or Organizations:   . Attends Archivist Meetings:   Marland Kitchen Marital Status:   Intimate Partner Violence:   . Fear of Current or Ex-Partner:   . Emotionally Abused:   Marland Kitchen Physically Abused:   . Sexually Abused:     Family History: Family History  Problem Relation Age of Onset  .  Cirrhosis Mother        due to ETOH  . Cancer Neg Hx     Review of Systems: All other systems reviewed and are otherwise negative except as noted above.   Physical Exam: Vitals:   05/07/19 1232  BP: (!) 104/52  Pulse: 67  SpO2: 97%  Weight: 144 lb 12.8 oz (65.7 kg)  Height: 5\' 11"  (1.803 m)     GEN- The patient is well appearing, alert and oriented x 3 today.   HEENT: normocephalic, atraumatic; sclera clear, conjunctiva pink; hearing intact; oropharynx clear; neck supple, no JVP Lymph- no cervical lymphadenopathy Lungs- Clear to ausculation bilaterally, normal work of breathing.  No wheezes,  rales, rhonchi Heart- Regular rate and rhythm, no murmurs, rubs or gallops, PMI not laterally displaced GI- soft, non-tender, non-distended, bowel sounds present, no hepatosplenomegaly Extremities- no clubbing, cyanosis, or edema; DP/PT/radial pulses 2+ bilaterally MS- no significant deformity or atrophy Skin- warm and dry, no rash or lesion; ICD pocket well healed Psych- euthymic mood, full affect Neuro- strength and sensation are intact  ICD interrogation- reviewed in detail today,  See PACEART report  EKG:  EKG is not ordered today.  Recent Labs: 04/21/2019: BUN 8; Creatinine, Ser 0.97; Hemoglobin 15.4; Platelets 202; Potassium 4.2; Sodium 139 04/22/2019: Magnesium 1.9   Wt Readings from Last 3 Encounters:  05/07/19 144 lb 12.8 oz (65.7 kg)  04/22/19 139 lb 9.6 oz (63.3 kg)  04/13/19 147 lb (66.7 kg)     Other studies Reviewed: Additional studies/ records that were reviewed today include: Recent admission notes, previous EP office notes,    Assessment and Plan:  1.  Chronic systolic dysfunction s/p Medtronic dual chamber ICD  euvolemic today Stable on an appropriate medical regimen Normal ICD function See Pace Art report No changes today  2. Monomorphic VT Noted on hospital admission 04/22/2019 Had recently been started on Entresto, so not felt to need AAD at the time.   ATP zones added to his VT zone (3 burst 3 ramp) Re-iterated no driving x 6 months.   3. HTN Stable on current medications  4. CAD Denies ischemic symptoms and no notable change in EKG Consider repeat ischemic work up if VT recurs. Most recent myoview I can find was stable in 12/2012.   Current medicines are reviewed at length with the patient today.   The patient does not have concerns regarding his medicines.  The following changes were made today:  none  Labs/ tests ordered today include:  Orders Placed This Encounter  Procedures  . Basic Metabolic Panel (BMET)  . TSH  . Magnesium  . CUP PACEART INCLINIC DEVICE CHECK     Disposition:   Follow up with Me in 3 months for further. Sooner to consider AAD if has recurrent shocks.   Jacalyn Lefevre, PA-C  05/07/2019 1:44 PM  [place] [address] [phone] (office) [phone] (fax)

## 2019-05-07 ENCOUNTER — Encounter: Payer: Self-pay | Admitting: Student

## 2019-05-07 ENCOUNTER — Other Ambulatory Visit: Payer: Self-pay

## 2019-05-07 ENCOUNTER — Ambulatory Visit: Payer: Medicare Other | Admitting: Student

## 2019-05-07 VITALS — BP 104/52 | HR 67 | Ht 71.0 in | Wt 144.8 lb

## 2019-05-07 DIAGNOSIS — Z79899 Other long term (current) drug therapy: Secondary | ICD-10-CM

## 2019-05-07 DIAGNOSIS — I255 Ischemic cardiomyopathy: Secondary | ICD-10-CM | POA: Diagnosis not present

## 2019-05-07 DIAGNOSIS — I480 Paroxysmal atrial fibrillation: Secondary | ICD-10-CM | POA: Diagnosis not present

## 2019-05-07 DIAGNOSIS — I472 Ventricular tachycardia, unspecified: Secondary | ICD-10-CM

## 2019-05-07 LAB — CUP PACEART REMOTE DEVICE CHECK
Battery Remaining Longevity: 46 mo
Battery Remaining Percentage: 46 %
Battery Voltage: 2.96 V
Brady Statistic AP VP Percent: 1 %
Brady Statistic AP VS Percent: 18 %
Brady Statistic AS VP Percent: 1 %
Brady Statistic AS VS Percent: 78 %
Brady Statistic RA Percent Paced: 11 %
Brady Statistic RV Percent Paced: 1 %
Date Time Interrogation Session: 20210401025757
HighPow Impedance: 68 Ohm
HighPow Impedance: 68 Ohm
Implantable Lead Implant Date: 20120420
Implantable Lead Implant Date: 20180613
Implantable Lead Location: 753859
Implantable Lead Location: 753860
Implantable Pulse Generator Implant Date: 20160212
Lead Channel Impedance Value: 380 Ohm
Lead Channel Impedance Value: 660 Ohm
Lead Channel Pacing Threshold Amplitude: 0.5 V
Lead Channel Pacing Threshold Amplitude: 0.75 V
Lead Channel Pacing Threshold Pulse Width: 0.5 ms
Lead Channel Pacing Threshold Pulse Width: 0.5 ms
Lead Channel Sensing Intrinsic Amplitude: 11.6 mV
Lead Channel Sensing Intrinsic Amplitude: 2.8 mV
Lead Channel Setting Pacing Amplitude: 2 V
Lead Channel Setting Pacing Amplitude: 2.5 V
Lead Channel Setting Pacing Pulse Width: 0.5 ms
Lead Channel Setting Sensing Sensitivity: 0.5 mV
Pulse Gen Serial Number: 7225079

## 2019-05-07 LAB — CUP PACEART INCLINIC DEVICE CHECK
Battery Remaining Longevity: 49 mo
Brady Statistic RA Percent Paced: 12 %
Brady Statistic RV Percent Paced: 0.57 %
Date Time Interrogation Session: 20210402133450
HighPow Impedance: 74.25 Ohm
Implantable Lead Implant Date: 20120420
Implantable Lead Implant Date: 20180613
Implantable Lead Location: 753859
Implantable Lead Location: 753860
Implantable Pulse Generator Implant Date: 20160212
Lead Channel Impedance Value: 375 Ohm
Lead Channel Impedance Value: 612.5 Ohm
Lead Channel Pacing Threshold Amplitude: 0.5 V
Lead Channel Pacing Threshold Amplitude: 0.5 V
Lead Channel Pacing Threshold Amplitude: 0.5 V
Lead Channel Pacing Threshold Amplitude: 0.5 V
Lead Channel Pacing Threshold Pulse Width: 0.5 ms
Lead Channel Pacing Threshold Pulse Width: 0.5 ms
Lead Channel Pacing Threshold Pulse Width: 0.5 ms
Lead Channel Pacing Threshold Pulse Width: 0.5 ms
Lead Channel Sensing Intrinsic Amplitude: 11.6 mV
Lead Channel Sensing Intrinsic Amplitude: 2.9 mV
Lead Channel Setting Pacing Amplitude: 2 V
Lead Channel Setting Pacing Amplitude: 2.5 V
Lead Channel Setting Pacing Pulse Width: 0.5 ms
Lead Channel Setting Sensing Sensitivity: 0.5 mV
Pulse Gen Serial Number: 7225079

## 2019-05-07 NOTE — Patient Instructions (Addendum)
Medication Instructions:  NONE *If you need a refill on your cardiac medications before your next appointment, please call your pharmacy*   Lab Work:  TODAY BMET TSH MAGNESIUM If you have labs (blood work) drawn today and your tests are completely normal, you will receive your results only by: Marland Kitchen MyChart Message (if you have MyChart) OR . A paper copy in the mail If you have any lab test that is abnormal or we need to change your treatment, we will call you to review the results.   Testing/Procedures: NONE   Follow-Up: At [facility], you and your health needs are our priority.  As part of our continuing mission to provide you with exceptional heart care, we have created designated Provider Care Teams.  These Care Teams include your primary Cardiologist (physician) and Advanced Practice Providers (APPs -  Physician Assistants and Nurse Practitioners) who all work together to provide you with the care you need, when you need it.   Your next appointment:   3 MONTHS  The format for your next appointment:   In Person  Provider:   Oda Kilts, PA   Other Instructions Remote monitoring is used to monitor your ICD from home. This monitoring reduces the number of office visits required to check your device to one time per year. It allows [place] to keep an eye on the functioning of your device to ensure it is working properly. You are scheduled for a device check from home on 07/27/19. You may send your transmission at any time that day. If you have a wireless device, the transmission will be sent automatically. After your physician reviews your transmission, you will receive a postcard with your next transmission date.

## 2019-05-08 LAB — BASIC METABOLIC PANEL
BUN/Creatinine Ratio: 6 — ABNORMAL LOW (ref 10–24)
BUN: 6 mg/dL — ABNORMAL LOW (ref 8–27)
CO2: 26 mmol/L (ref 20–29)
Calcium: 9.8 mg/dL (ref 8.6–10.2)
Chloride: 101 mmol/L (ref 96–106)
Creatinine, Ser: 0.94 mg/dL (ref 0.76–1.27)
GFR calc Af Amer: 91 mL/min/{1.73_m2} (ref >59)
GFR calc non Af Amer: 79 mL/min/{1.73_m2} (ref >59)
Glucose: 94 mg/dL (ref 65–99)
Potassium: 4.2 mmol/L (ref 3.5–5.2)
Sodium: 140 mmol/L (ref 134–144)

## 2019-05-08 LAB — TSH: TSH: 0.757 u[IU]/mL (ref 0.450–4.500)

## 2019-05-08 LAB — MAGNESIUM: Magnesium: 2 mg/dL (ref 1.6–2.3)

## 2019-05-09 NOTE — Telephone Encounter (Signed)
Patient seen by EP PA subsequently. Continue to monitor

## 2019-05-10 NOTE — Addendum Note (Signed)
Addended by: [name] on: 05/10/2019 01:37 PM   Modules accepted: Level of Service

## 2019-05-10 NOTE — Progress Notes (Signed)
EPIC Encounter for ICM Monitoring  Patient Name: Benjamin Cook is a 74 y.o. male Date: 05/10/2019 Primary Care Physican: Patient, No Pcp Per Primary Cardiologist:Crenshaw Electrophysiologist: Allred Lastweight:147 lbs   Transmission reviewed.  CorVue thoracic impedancenormal.  Prescribed:Furosemide 40 mg0.5tablet (20 mg total)daily. Potassium 20 mEq 1 tablet daily.  Labs: 05/07/2019 Creatinine 0.94, BUN 6, Potassium 4.2, Sodium 140, GFR 79-91 04/21/2019 Creatinine 0.97, BUN 8, Potassium 4.2, Sodium 139, GFR >60 02/05/2021Creatinine 0.94, BUN7, Potassium4.1, Sodium 139, [id number]  Recommendations:None  Follow-up plan: ICM clinic phone appointment on4/27/2021. 91 day device clinic remote transmission 07/27/2019.   Copy of ICM check sent to Dr.Allred   3 month ICM trend: 05/06/2019    1 Year ICM trend:       [name], RN 05/10/2019 1:32 PM

## 2019-05-12 ENCOUNTER — Other Ambulatory Visit: Payer: Self-pay

## 2019-05-12 MED ORDER — ATORVASTATIN CALCIUM 80 MG PO TABS: 80.0000 mg | ORAL_TABLET | Freq: Every day | ORAL | 3 refills | Status: DC

## 2019-05-13 ENCOUNTER — Ambulatory Visit: Payer: Medicare Other | Admitting: Physician Assistant

## 2019-05-21 ENCOUNTER — Ambulatory Visit: Payer: Medicare Other | Admitting: Vascular Surgery

## 2019-05-21 ENCOUNTER — Other Ambulatory Visit (HOSPITAL_COMMUNITY): Payer: Medicare Other

## 2019-05-21 ENCOUNTER — Telehealth: Payer: Self-pay | Admitting: Endocrinology

## 2019-05-21 NOTE — Telephone Encounter (Signed)
Nurse would like to speak to Dr or nurse regarding patient's Quantflo results. Ph# [phone]

## 2019-05-24 NOTE — Telephone Encounter (Signed)
Returned [employer]. Pt has not been seen since 2019 and would be in need of an appt to address any concerns Sharyn Lull has re: quantflo results. Left detailed VM informing of this as well.

## 2019-05-24 NOTE — Telephone Encounter (Signed)
Sharyn Lull with [facility] returned call and informed me that both the pt and his wife informed that Dr. Loanne Drilling is pt PCP. Informed Sharyn Lull that Dr. Loanne Drilling is no longer pt PCP and has not been pt PCP since he is no longer credentialed as of 08/2017. In addition, we have not seen this pt since 08/08/17. Vicenta Dunning that she will need to discuss with BOTH pt and his wife that they will need to establish with a PCP to further address whatever concerns she may have with pt Quantflo results as this is not something that can be addressed NOR managed by Dr. Loanne Drilling. Verbalized acceptance and understanding.

## 2019-06-03 ENCOUNTER — Ambulatory Visit (INDEPENDENT_AMBULATORY_CARE_PROVIDER_SITE_OTHER): Payer: Medicare Other

## 2019-06-03 DIAGNOSIS — Z95810 Presence of automatic (implantable) cardiac defibrillator: Secondary | ICD-10-CM | POA: Diagnosis not present

## 2019-06-03 DIAGNOSIS — I5022 Chronic systolic (congestive) heart failure: Secondary | ICD-10-CM

## 2019-06-07 ENCOUNTER — Telehealth: Payer: Self-pay

## 2019-06-07 NOTE — Telephone Encounter (Signed)
Remote ICM transmission received.  Attempted call to patient regarding ICM remote transmission and no answer or answering machine. 

## 2019-06-07 NOTE — Progress Notes (Signed)
EPIC Encounter for ICM Monitoring  Patient Name: Benjamin Cook is a 74 y.o. male Date: 06/07/2019 Primary Care Physican: Patient, No Pcp Per Primary Cardiologist:Crenshaw Electrophysiologist: Allred Lastweight:147 lbs   Attempted call to patient and unable to reach.   Transmission reviewed.   CorVue thoracic impedancenormal.  Prescribed:Furosemide 40 mg0.5tablet (20 mg total)daily. Potassium 20 mEq 1 tablet daily.  Labs: 05/07/2019 Creatinine 0.94, BUN 6, Potassium 4.2, Sodium 140, GFR 79-91 04/21/2019 Creatinine 0.97, BUN 8, Potassium 4.2, Sodium 139, GFR >60 02/05/2021Creatinine 0.94, BUN7, Potassium4.1, Sodium 139, [id number]  Recommendations:Unable to reach.    Follow-up plan: ICM clinic phone appointment on6/02/2019. 91 day device clinic remote transmission6/22/2021.   Copy of ICM check sent to Dr.Allred  3 month ICM trend: 06/03/2019    1 Year ICM trend:       [name], RN 06/07/2019 4:55 PM

## 2019-06-18 IMAGING — CT CT ABD-PELV W/ CM
2 of 5 series · 16 of 46 positions shown, 18 images · IV contrast (Omni 300)
Comparison: 11/15/2016 and prior CTs

CLINICAL DATA: 71-year-old male with abdominal and pelvic pain for
1 week.

EXAM:
CT ABDOMEN AND PELVIS WITH CONTRAST
TECHNIQUE: Multidetector CT imaging of the abdomen and pelvis was performed
using the standard protocol following bolus administration of
intravenous contrast.
CONTRAST:  100mL [id number] IOPAMIDOL ([id number]) INJECTION 61%

[Series 3: a/p w/ 5mm · axial · 0.62mm/px · z∈[+798,+1158]mm · 13 of 82 slices shown, 15 images]
[im 5/82  soft-tissue]
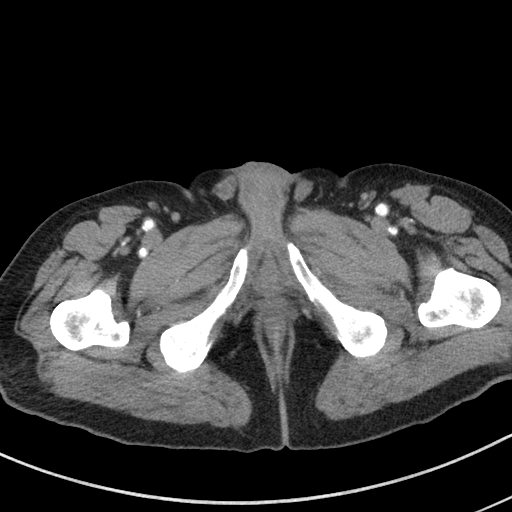
[im 5/82  bone]
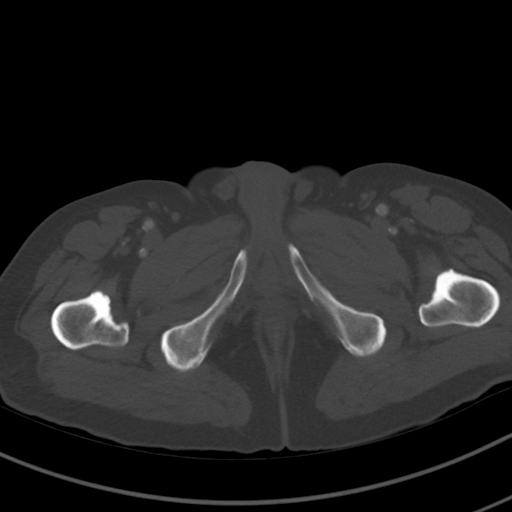
[im 13/82  soft-tissue]
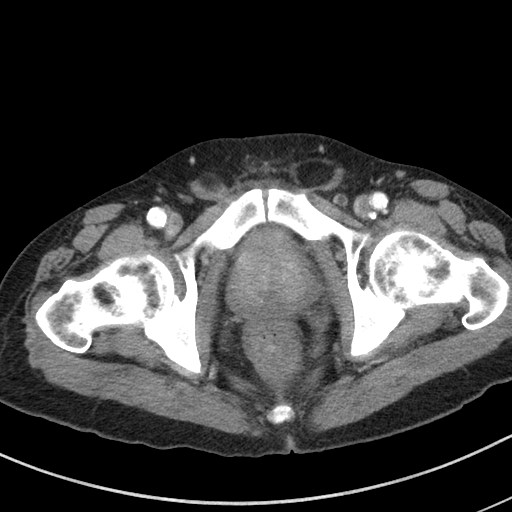
[im 17/82  soft-tissue]
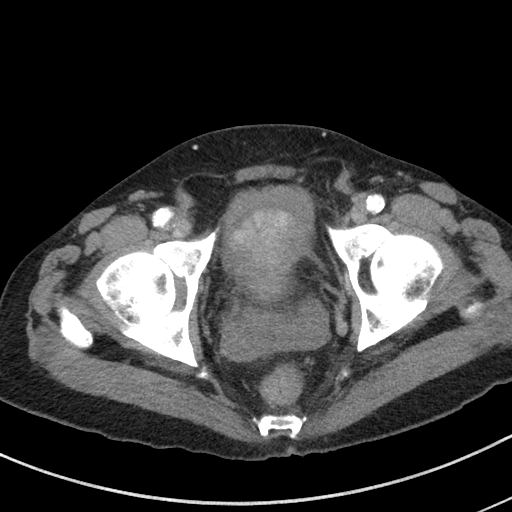
[im 25/82  soft-tissue]
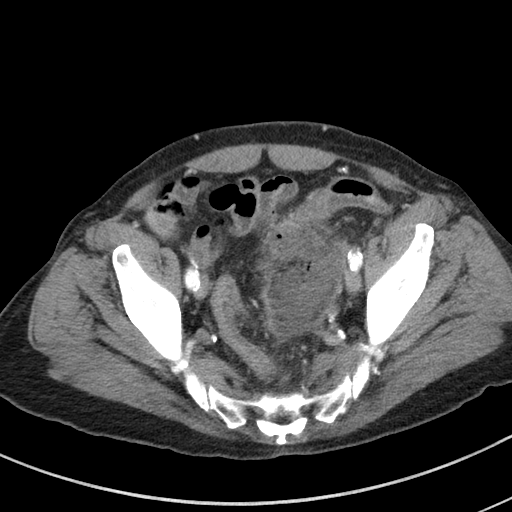
[im 29/82  soft-tissue]
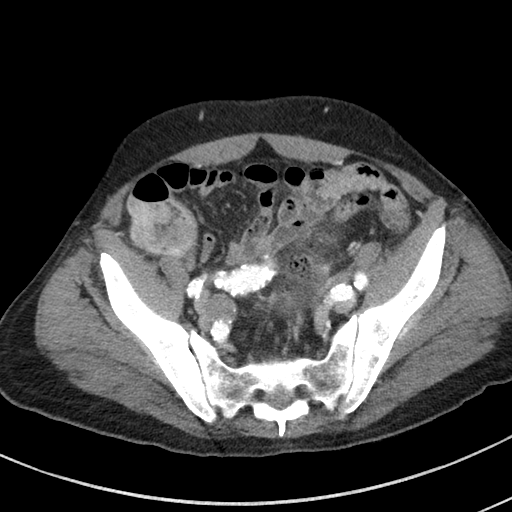
[im 37/82  soft-tissue]
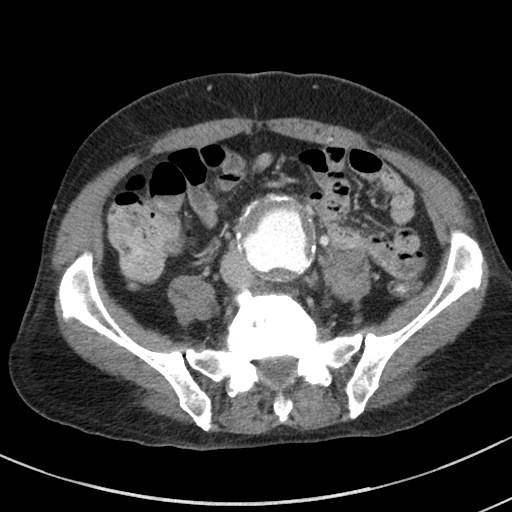
[im 41/82  soft-tissue]
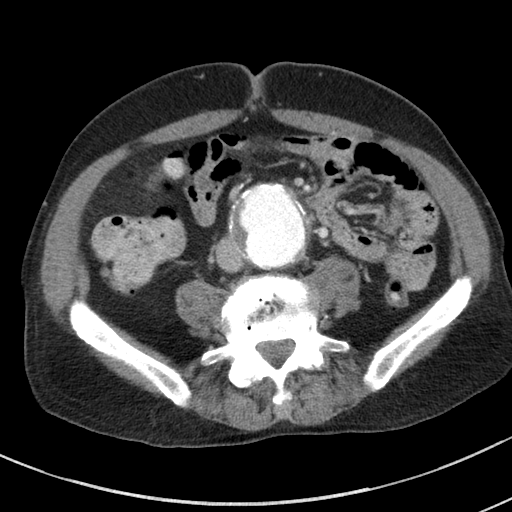
[im 45/82  soft-tissue]
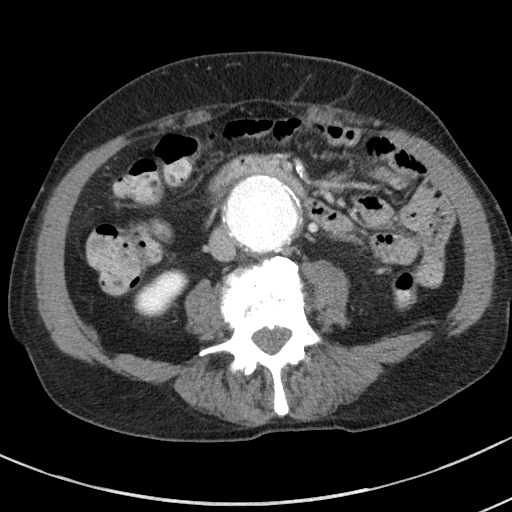
[im 53/82  soft-tissue]
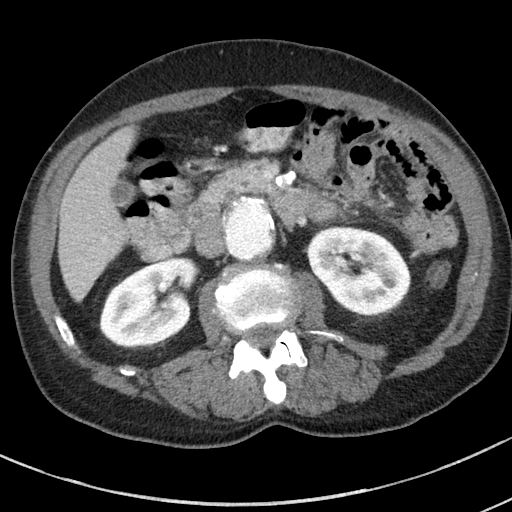
[im 53/82  bone]
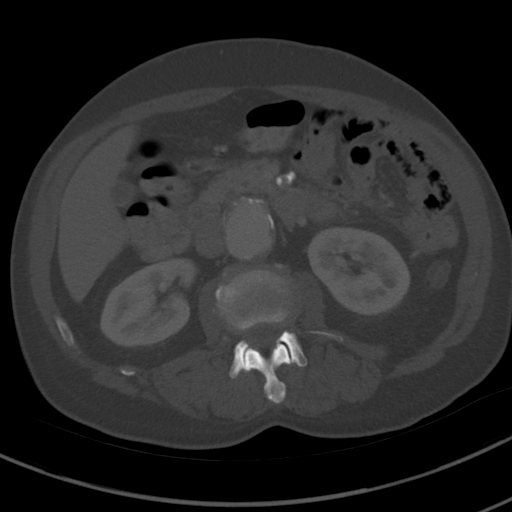
[im 57/82  soft-tissue]
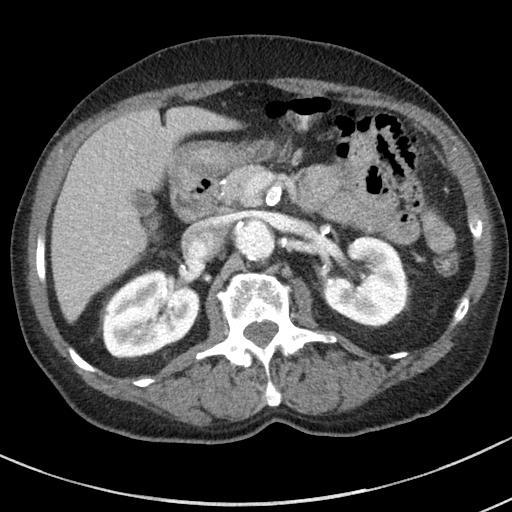
[im 65/82  soft-tissue]
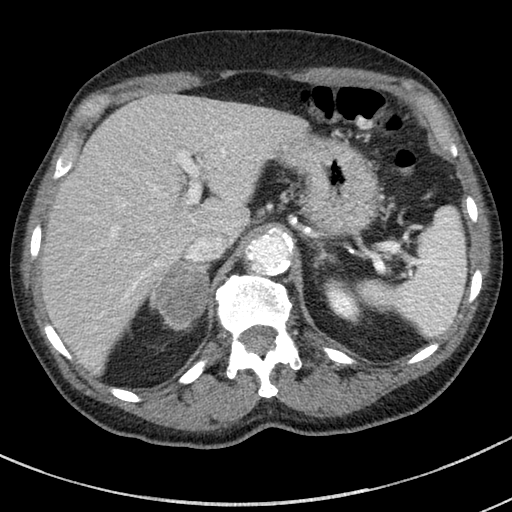
[im 69/82  soft-tissue]
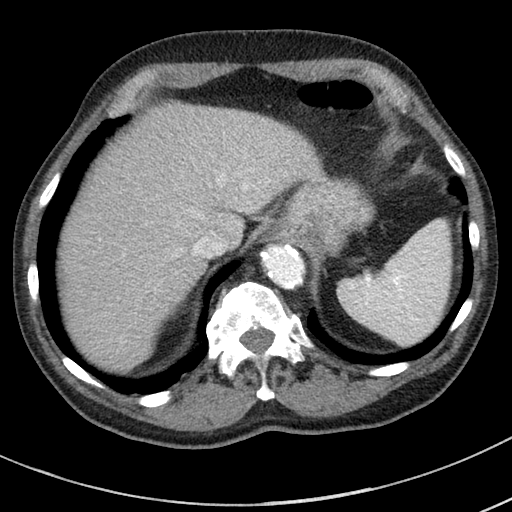
[im 77/82  soft-tissue]
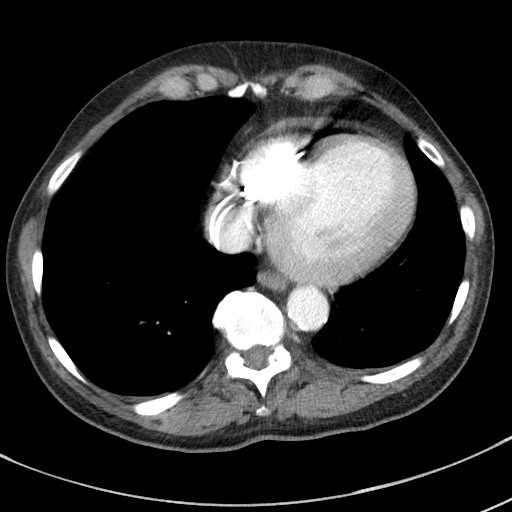

[Series 6: a/p w/ cor · coronal · 0.64mm/px · 3 of 151 slices shown]
[im 51/151  soft-tissue]
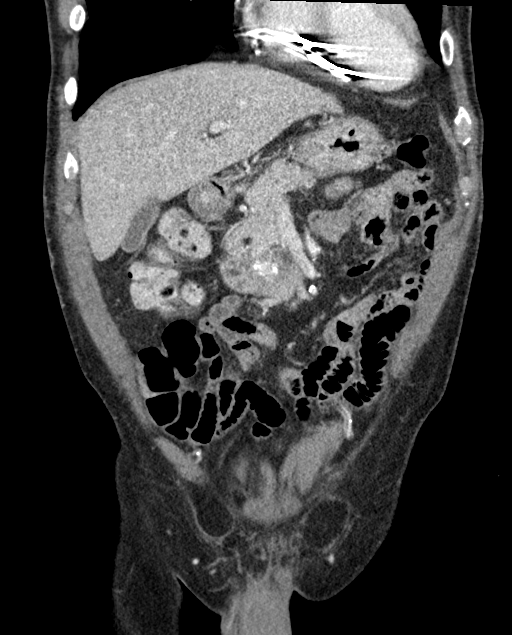
[im 67/151  soft-tissue]
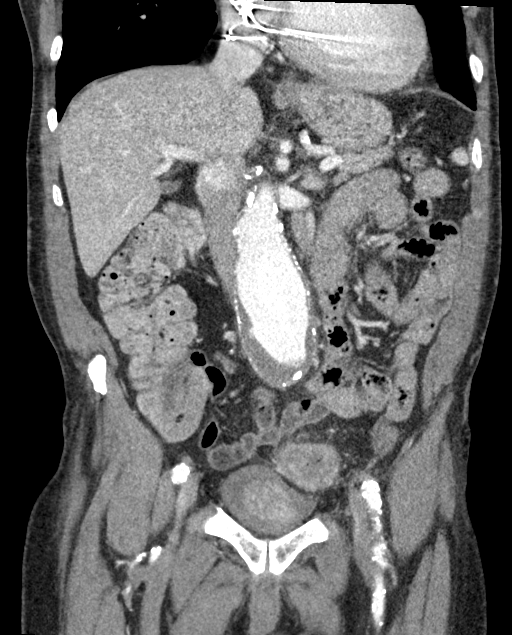
[im 84/151  soft-tissue]
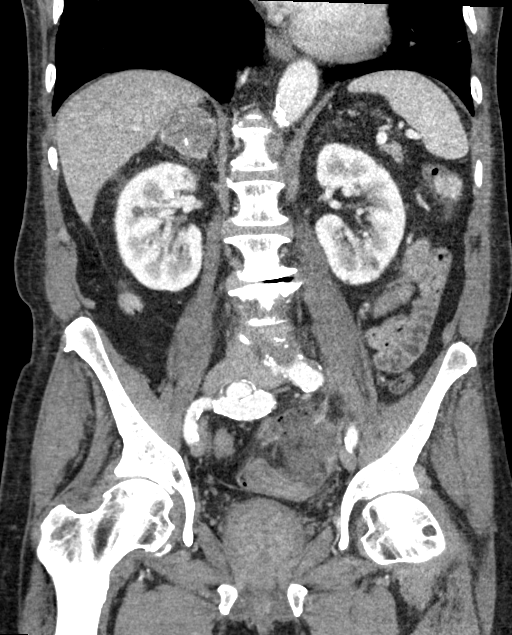

[16 of 46 positions shown; findings below may reference images not displayed]

FINDINGS: Lower chest: No acute abnormality.

Hepatobiliary: The liver and gallbladder are unremarkable. No
biliary dilatation.

Pancreas: Unremarkable

Spleen: Unremarkable

Adrenals/Urinary Tract: A 3.1 x 4 cm low-density right adrenal mass
is unchanged from 1441-benign. Small bilateral renal cysts are
present. There is no evidence of hydronephrosis. The visualized
portions of the bladder are unremarkable.

Stomach/Bowel: New 5.5 x 7.6 x 5 cm ill-defined phlegmonous change
with foci of free air noted adjacent to the sigmoid colon at this
site of recent acute diverticulitis. No discrete abscess is
identified. There is no evidence of bowel obstruction. The appendix
is normal.

Vascular/Lymphatic: A 5.5 cm infrarenal suprailiac abdominal aortic
aneurysm is again noted with unchanged 2.2 cm right common iliac and
2 cm left common iliac artery aneurysms. No enlarged lymph nodes.

Reproductive: Marked prostate enlargement again noted.

Other: No ascites. Small bilateral inguinal hernias containing fat
again identified.

Musculoskeletal: No acute or does suspicious focal bony abnormality.
Moderate to severe degenerative disc disease and spondylosis in the
lower lumbar spine again noted.
IMPRESSION: 1. Increasing inflammatory changes from recent acute sigmoid
diverticulitis, now with new 5.5 x 7.6 x 5 cm ill-defined phlegmon
with foci of free air adjacent to the sigmoid colon. No discrete
abscess identified. No evidence of bowel obstruction.
2. 5.5 cm infrarenal suprailiac abdominal aortic aneurysm, unchanged
from 11/15/2016, but significantly increased from 1441. As noted
previously, vascular surgery consultation recommended if not
performed.
3. Common iliac artery aneurysms as described
4. Marked prostate enlargement and small bilateral inguinal hernias
containing fat
5. Benign right adrenal mass/adenoma.

## 2019-07-14 ENCOUNTER — Ambulatory Visit (INDEPENDENT_AMBULATORY_CARE_PROVIDER_SITE_OTHER): Payer: Medicare Other

## 2019-07-14 DIAGNOSIS — Z95810 Presence of automatic (implantable) cardiac defibrillator: Secondary | ICD-10-CM

## 2019-07-14 DIAGNOSIS — I5022 Chronic systolic (congestive) heart failure: Secondary | ICD-10-CM

## 2019-07-16 NOTE — Progress Notes (Signed)
EPIC Encounter for ICM Monitoring  Patient Name: Benjamin Cook is a 74 y.o. male Date: 07/16/2019 Primary Care Physican: Patient, No Pcp Per Primary Cardiologist:Crenshaw Electrophysiologist: Allred Lastweight:147 lbs   Transmission reviewed.   CorVue thoracic impedancenormal.  Prescribed:Furosemide 40 mg0.5tablet (20 mg total)daily. Potassium 20 mEq 1 tablet daily.  Labs: 05/07/2019 Creatinine 0.94, BUN 6, Potassium 4.2, Sodium 140, GFR 79-91 04/21/2019 Creatinine 0.97, BUN 8, Potassium 4.2, Sodium 139, GFR >60 02/05/2021Creatinine 0.94, BUN7, Potassium4.1, Sodium 139, [id number]  Recommendations:None.    Follow-up plan: ICM clinic phone appointment on7/01/2020.91 day device clinic remote transmission6/22/2021.  Office appointment with Oda Kilts, PA on 08/06/2019.  Copy of ICM check sent to Dr.Allred  3 month ICM trend: 07/14/2019    1 Year ICM trend:       [name], RN 07/16/2019 9:56 AM

## 2019-07-27 ENCOUNTER — Ambulatory Visit (INDEPENDENT_AMBULATORY_CARE_PROVIDER_SITE_OTHER): Payer: Medicare Other | Admitting: *Deleted

## 2019-07-27 DIAGNOSIS — I255 Ischemic cardiomyopathy: Secondary | ICD-10-CM

## 2019-07-27 DIAGNOSIS — I5022 Chronic systolic (congestive) heart failure: Secondary | ICD-10-CM | POA: Diagnosis not present

## 2019-07-27 LAB — CUP PACEART REMOTE DEVICE CHECK
Battery Remaining Longevity: 44 mo
Battery Remaining Percentage: 44 %
Battery Voltage: 2.96 V
Brady Statistic AP VP Percent: 1 %
Brady Statistic AP VS Percent: 19 %
Brady Statistic AS VP Percent: 1 %
Brady Statistic AS VS Percent: 78 %
Brady Statistic RA Percent Paced: 14 %
Brady Statistic RV Percent Paced: 1 %
Date Time Interrogation Session: 20210622112611
HighPow Impedance: 69 Ohm
HighPow Impedance: 69 Ohm
Implantable Lead Implant Date: 20120420
Implantable Lead Implant Date: 20180613
Implantable Lead Location: 753859
Implantable Lead Location: 753860
Implantable Pulse Generator Implant Date: 20160212
Lead Channel Impedance Value: 350 Ohm
Lead Channel Impedance Value: 600 Ohm
Lead Channel Pacing Threshold Amplitude: 0.5 V
Lead Channel Pacing Threshold Amplitude: 0.5 V
Lead Channel Pacing Threshold Pulse Width: 0.5 ms
Lead Channel Pacing Threshold Pulse Width: 0.5 ms
Lead Channel Sensing Intrinsic Amplitude: 11.6 mV
Lead Channel Sensing Intrinsic Amplitude: 2.5 mV
Lead Channel Setting Pacing Amplitude: 2 V
Lead Channel Setting Pacing Amplitude: 2.5 V
Lead Channel Setting Pacing Pulse Width: 0.5 ms
Lead Channel Setting Sensing Sensitivity: 0.5 mV
Pulse Gen Serial Number: 7225079

## 2019-07-28 ENCOUNTER — Telehealth: Payer: Self-pay | Admitting: Cardiology

## 2019-07-28 NOTE — Telephone Encounter (Signed)
Spoke to patient's wife she stated entresto too expensive.Advised office out of samples.Xarelto 20 mg samples along with a patient assistance application for entresto left at front desk.Advised she needs to bring application with husband's prrof of income back to office.I left Dr.Crenshaw's part of application in his box.Advised I will send message to Dr.Crenshaw's RN for both applications to be faxed together.

## 2019-07-28 NOTE — Progress Notes (Signed)
Remote ICD transmission.   

## 2019-07-28 NOTE — Telephone Encounter (Signed)
Patient calling the office for samples of medication:   1.  What medication and dosage are you requesting samples for?   sacubitril-valsartan (ENTRESTO) 49-51 MG    XARELTO 20 MG TABS tablet     2.  Are you currently out of this medication? Yes

## 2019-08-06 ENCOUNTER — Encounter: Payer: Medicare Other | Admitting: Student

## 2019-08-16 ENCOUNTER — Ambulatory Visit (INDEPENDENT_AMBULATORY_CARE_PROVIDER_SITE_OTHER): Payer: Medicare Other

## 2019-08-16 DIAGNOSIS — I5022 Chronic systolic (congestive) heart failure: Secondary | ICD-10-CM | POA: Diagnosis not present

## 2019-08-16 DIAGNOSIS — Z95810 Presence of automatic (implantable) cardiac defibrillator: Secondary | ICD-10-CM | POA: Diagnosis not present

## 2019-08-16 NOTE — Progress Notes (Signed)
EPIC Encounter for ICM Monitoring  Patient Name: Benjamin Cook is a 74 y.o. male Date: 08/16/2019 Primary Care Physican: Patient, No Pcp Per Primary Cardiologist:Crenshaw Electrophysiologist: Allred Lastweight:147 lbs   Spoke with wife.  Patient taking afternoon nap.  She reports pt has not complained of any fluid symptoms.  He is outside a lot and increasing fluid intake due to hot weather.   He is compliant with Furosemide.  CorVue thoracic impedancesuggesting possible fluid accumulation since 08/14/2019.    Prescribed:  Furosemide 40 mg0.5tablet (20 mg total)daily.   Potassium 20 mEq 1 tablet daily.  Labs: 05/07/2019 Creatinine 0.94, BUN 6, Potassium 4.2, Sodium 140, GFR 79-91 04/21/2019 Creatinine 0.97, BUN 8, Potassium 4.2, Sodium 139, GFR >60 02/05/2021Creatinine 0.94, BUN7, Potassium4.1, Sodium 139, [id number]  Recommendations: Advised to limit salt intake and if patient is not outside to limit fluid intake to 64 oz daily.  If patient is outside in the heat he should drink more than 64 oz daily.  Encouraged to call if experiencing fluid symptoms.  Follow-up plan: ICM clinic phone appointment on 08/24/2019 to recheck fluid levels.   91 day device clinic remote transmission 10/26/2019.    EP/Cardiology Office Visits: 08/27/2019 with Tommye Standard, PA.    Copy of ICM check sent to Dr. Rayann Heman and Dr [name].   3 month ICM trend: 08/16/2019    1 Year ICM trend:       [name], RN 08/16/2019 2:43 PM

## 2019-08-24 ENCOUNTER — Ambulatory Visit (INDEPENDENT_AMBULATORY_CARE_PROVIDER_SITE_OTHER): Payer: Medicare Other

## 2019-08-24 DIAGNOSIS — Z95810 Presence of automatic (implantable) cardiac defibrillator: Secondary | ICD-10-CM

## 2019-08-24 DIAGNOSIS — I5022 Chronic systolic (congestive) heart failure: Secondary | ICD-10-CM

## 2019-08-24 NOTE — Progress Notes (Signed)
EPIC Encounter for ICM Monitoring  Patient Name: Benjamin Cook is a 74 y.o. male Date: 08/24/2019 Primary Care Physican: Patient, No Pcp Per Primary Cardiologist:Crenshaw Electrophysiologist: Allred Lastweight:147 lbs   Spoke with wife.  She said patient is doing well. Transmission reviewed.  She said she advised patient to cut back on salt as discussed at last ICM call.  CorVue thoracic impedancereturned to normal.    Prescribed:  Furosemide 40 mg0.5tablet (20 mg total)daily.   Potassium 20 mEq 1 tablet daily.  Labs: 05/07/2019 Creatinine 0.94, BUN 6, Potassium 4.2, Sodium 140, GFR 79-91 04/21/2019 Creatinine 0.97, BUN 8, Potassium 4.2, Sodium 139, GFR >60 02/05/2021Creatinine 0.94, BUN7, Potassium4.1, Sodium 139, [id number]  Recommendations: No changes and encouraged to call if experiencing any fluid symptoms.    Follow-up plan: ICM clinic phone appointment on 09/20/2019.   91 day device clinic remote transmission 10/26/2019.    EP/Cardiology Office Visits: 08/27/2019 with Tommye Standard, PA.    Copy of ICM check sent to Dr. Rayann Heman.    3 month ICM trend: 08/24/2019    1 Year ICM trend:       [name], RN 08/24/2019 8:43 AM

## 2019-08-27 ENCOUNTER — Encounter: Payer: Medicare Other | Admitting: Physician Assistant

## 2019-09-10 ENCOUNTER — Ambulatory Visit: Payer: Medicare Other | Admitting: Student

## 2019-09-10 ENCOUNTER — Encounter: Payer: Self-pay | Admitting: Student

## 2019-09-10 ENCOUNTER — Other Ambulatory Visit: Payer: Self-pay

## 2019-09-10 VITALS — BP 150/68 | HR 68 | Ht 71.0 in | Wt 146.0 lb

## 2019-09-10 DIAGNOSIS — I10 Essential (primary) hypertension: Secondary | ICD-10-CM | POA: Diagnosis not present

## 2019-09-10 DIAGNOSIS — Z95810 Presence of automatic (implantable) cardiac defibrillator: Secondary | ICD-10-CM

## 2019-09-10 DIAGNOSIS — I5022 Chronic systolic (congestive) heart failure: Secondary | ICD-10-CM | POA: Diagnosis not present

## 2019-09-10 DIAGNOSIS — I480 Paroxysmal atrial fibrillation: Secondary | ICD-10-CM

## 2019-09-10 LAB — CUP PACEART INCLINIC DEVICE CHECK
Battery Remaining Longevity: 45 mo
Brady Statistic RA Percent Paced: 16 %
Brady Statistic RV Percent Paced: 0.79 %
Date Time Interrogation Session: 20210806093325
HighPow Impedance: 74.25 Ohm
Implantable Lead Implant Date: 20120420
Implantable Lead Implant Date: 20180613
Implantable Lead Location: 753859
Implantable Lead Location: 753860
Implantable Pulse Generator Implant Date: 20160212
Lead Channel Impedance Value: 375 Ohm
Lead Channel Impedance Value: 662.5 Ohm
Lead Channel Pacing Threshold Amplitude: 0.5 V
Lead Channel Pacing Threshold Amplitude: 0.5 V
Lead Channel Pacing Threshold Amplitude: 0.5 V
Lead Channel Pacing Threshold Amplitude: 0.5 V
Lead Channel Pacing Threshold Pulse Width: 0.5 ms
Lead Channel Pacing Threshold Pulse Width: 0.5 ms
Lead Channel Pacing Threshold Pulse Width: 0.5 ms
Lead Channel Pacing Threshold Pulse Width: 0.5 ms
Lead Channel Sensing Intrinsic Amplitude: 11.6 mV
Lead Channel Sensing Intrinsic Amplitude: 2.3 mV
Lead Channel Setting Pacing Amplitude: 2 V
Lead Channel Setting Pacing Amplitude: 2.5 V
Lead Channel Setting Pacing Pulse Width: 0.5 ms
Lead Channel Setting Sensing Sensitivity: 0.5 mV
Pulse Gen Serial Number: 7225079

## 2019-09-10 NOTE — Progress Notes (Signed)
Electrophysiology Office Note Date: 09/10/2019  ID:  Benjamin Cook, DOB January 16, 1946, MRN [id number]  PCP: Patient, No Pcp Per Primary Cardiologist: Kirk Ruths, MD Electrophysiologist: Dr. Rayann Heman  CC: Routine ICD follow-up  Benjamin Cook is a 74 y.o. male seen today for Dr. Rayann Heman for routine electrophysiology followup.  Since last being seen in our clinic the patient reports doing well. He has not taken his medications yet this am. He takes them at lunch and bedtime.   he denies chest pain, palpitations, dyspnea, PND, orthopnea, nausea, vomiting, dizziness, syncope, edema, weight gain, or early satiety. He has not had ICD shocks.   Device History: St. Jude Dual Chamber ICD implanted 2016, lead revision  for 07/2016 History of appropriate therapy: Yes History of AAD therapy: No  Past Medical History:  Diagnosis Date  . Adrenal mass ([place])    per pt this is remote (10 years) and benign by biopsy  . AICD (automatic cardioverter/defibrillator) present   . Anxiety   . Arthritis   . CAD (coronary artery disease)    a. s/p PCI to LAD 2001 b. myoview 2014 high risk with scar LAD/RCA territory but no ischemia  . Cardiomyopathy, ischemic   . CHF (congestive heart failure) ([name])    class II/III  . COPD (chronic obstructive pulmonary disease) ([name])    smokes cigars but has quit cigarettes  . Defibrillator discharge 04/21/2019  . Dyspnea   . Flu 03/2015  . HTN (hypertension)   . Hyperlipidemia   . NSVT (nonsustained ventricular tachycardia) ([place]) 03/05/2017  . Paroxysmal atrial fibrillation ([name]) 03/2015   chads2vasc score is 4  . PSA (psoriatic arthritis) ([place])    increased  . PVD (peripheral vascular disease) ([place])    Past Surgical History:  Procedure Laterality Date  . ABDOMINAL AORTIC ENDOVASCULAR STENT GRAFT N/A 03/03/2017   Procedure: ABDOMINAL AORTIC ENDOVASCULAR STENT GRAFT;  Surgeon: Waynetta Sandy, MD;  Location: [facility];  Service: Vascular;  Laterality:  N/A;  . cataract surgery    . CORONARY ANGIOPLASTY WITH STENT PLACEMENT  2001   a. PCI to LAD  . IMPLANTABLE CARDIOVERTER DEFIBRILLATOR (ICD) GENERATOR CHANGE N/A 03/18/2014   a. SJM Fortify ST DR ICD implanted by [facility] for primary prevention b. gen change 03/2014   . LEAD REVISION/REPAIR N/A 07/17/2016   Procedure: Lead Revision/Repair;  Surgeon: Evans Lance, MD;  Location: [facility];  Service: Cardiovascular;  Laterality: N/A;  . LEFT HEART CATHETERIZATION WITH CORONARY ANGIOGRAM N/A 05/27/2014   Procedure: LEFT HEART CATHETERIZATION WITH CORONARY ANGIOGRAM;  Surgeon: Sherren Mocha, MD;  Location: [facility] CATH LAB;  Service: Cardiovascular;  Laterality: N/A;    Current Outpatient Medications  Medication Sig Dispense Refill  . acetaminophen (TYLENOL) 500 MG tablet Take 500-1,000 mg by mouth every 8 (eight) hours as needed (for pain).     Marland Kitchen atorvastatin (LIPITOR) 80 MG tablet Take 1 tablet (80 mg total) by mouth daily at 6 PM. 90 tablet 3  . carvedilol (COREG) 25 MG tablet TAKE 1 TABLET BY MOUTH  TWICE DAILY WITH MEALS 180 tablet 3  . Fluticasone-Salmeterol (ADVAIR) 100-50 MCG/DOSE AEPB Inhale 1 puff into the lungs 2 (two) times daily. 1 each 3  . furosemide (LASIX) 40 MG tablet TAKE ONE-HALF TABLET BY  MOUTH DAILY 45 tablet 3  . ibuprofen (ADVIL) 200 MG tablet Take 400 mg by mouth every 6 (six) hours as needed for headache or mild pain.    . Multiple Vitamin (MULTIVITAMIN) capsule Take  1 capsule by mouth daily.      . Omega-3 Fatty Acids (FISH OIL) 1200 MG CAPS Take 1,200 mg by mouth daily.    . potassium chloride SA (KLOR-CON) 20 MEQ tablet TAKE 1 TABLET BY MOUTH  DAILY 90 tablet 3  . sacubitril-valsartan (ENTRESTO) 49-51 MG Take 1 tablet by mouth 2 (two) times daily. 60 tablet 12  . XARELTO 20 MG TABS tablet TAKE 1 TABLET (20 MG TOTAL) BY MOUTH DAILY WITH SUPPER. 90 tablet 1   No current facility-administered medications for this visit.    Allergies:   Patient has no known  allergies.   Social History: Social History   Socioeconomic History  . Marital status: Married    Spouse name: Not on file  . Number of children: Not on file  . Years of education: Not on file  . Highest education level: Not on file  Occupational History  . Occupation: Lawn care  Tobacco Use  . Smoking status: Current Every Day Smoker    Types: Cigars  . Smokeless tobacco: Never Used  . Tobacco comment: smokes cigars now, no longer smokes cigarettes but previously smoked 2ppd  Vaping Use  . Vaping Use: Never used  Substance and Sexual Activity  . Alcohol use: No  . Drug use: No  . Sexual activity: Not Currently    Partners: Female    Birth control/protection: None  Other Topics Concern  . Not on file  Social History Narrative  . Not on file   Social Determinants of Health   Financial Resource Strain:   . Difficulty of Paying Living Expenses:   Food Insecurity:   . Worried About Charity fundraiser in the Last Year:   . Arboriculturist in the Last Year:   Transportation Needs:   . Film/video editor (Medical):   Marland Kitchen Lack of Transportation (Non-Medical):   Physical Activity:   . Days of Exercise per Week:   . Minutes of Exercise per Session:   Stress:   . Feeling of Stress :   Social Connections:   . Frequency of Communication with Friends and Family:   . Frequency of Social Gatherings with Friends and Family:   . Attends Religious Services:   . Active Member of Clubs or Organizations:   . Attends Archivist Meetings:   Marland Kitchen Marital Status:   Intimate Partner Violence:   . Fear of Current or Ex-Partner:   . Emotionally Abused:   Marland Kitchen Physically Abused:   . Sexually Abused:     Family History: Family History  Problem Relation Age of Onset  . Cirrhosis Mother        due to ETOH  . Cancer Neg Hx     Review of Systems: All other systems reviewed and are otherwise negative except as noted above.   Physical Exam: Vitals:   09/10/19 0904  BP:  (!) 150/68  Pulse: 68  SpO2: 97%  Weight: 146 lb (66.2 kg)  Height: 5\' 11"  (1.803 m)     GEN- The patient is well appearing, alert and oriented x 3 today.   HEENT: normocephalic, atraumatic; sclera clear, conjunctiva pink; hearing intact; oropharynx clear; neck supple, no JVP Lymph- no cervical lymphadenopathy Lungs- Clear to ausculation bilaterally, normal work of breathing.  No wheezes, rales, rhonchi Heart- Regular rate and rhythm, no murmurs, rubs or gallops, PMI not laterally displaced GI- soft, non-tender, non-distended, bowel sounds present, no hepatosplenomegaly Extremities- no clubbing, cyanosis, or edema; DP/PT/radial pulses 2+  bilaterally MS- no significant deformity or atrophy Skin- warm and dry, no rash or lesion; ICD pocket well healed Psych- euthymic mood, full affect Neuro- strength and sensation are intact  ICD interrogation- reviewed in detail today,  See PACEART report  EKG:  EKG is not ordered today.  Recent Labs: 04/21/2019: Hemoglobin 15.4; Platelets 202 05/07/2019: BUN 6; Creatinine, Ser 0.94; Magnesium 2.0; Potassium 4.2; Sodium 140; TSH 0.757   Wt Readings from Last 3 Encounters:  09/10/19 146 lb (66.2 kg)  05/07/19 144 lb 12.8 oz (65.7 kg)  04/22/19 139 lb 9.6 oz (63.3 kg)     Other studies Reviewed: Additional studies/ records that were reviewed today include: Previous EP office notes.    Assessment and Plan:  1.  Chronic systolic dysfunction s/p Medtronic dual chamber ICD  Euvelemic today Stable on an appropriate medical regimen Normal ICD function.  See Claudia Desanctis Art report No changes today.   2. Monomorphic VT Noted on admission 04/22/2019 Now on Entresto.  ATP zones added to his VT zone.  He will be able to drive 6/57 if no further VT.   3. HTN Elevated today, but prefers not to go up on Entresto at this time.  Pt will check BP BID for next 2 weeks and update [place].   4. CAD Denies ischemic symptoms.  Consider additional work up if VT  recurs.  Myoview 12/2012 stable.   5. Paroxysmal AF Low burden Stable on Xarelto. No bleeding.   Current medicines are reviewed at length with the patient today.   The patient does not have concerns regarding his medicines.  The following changes were made today:  none  Labs/ tests ordered today include:  No orders of the defined types were placed in this encounter.  Disposition:   Follow up with me in 6 months. Sooner if further VT.   Jacalyn Lefevre, PA-C  09/10/2019 9:13 AM  [facility] [address] [place] [address] [phone] (office) [phone] (fax)

## 2019-09-10 NOTE — Patient Instructions (Signed)
Medication Instructions:   Your physician recommends that you continue on your current medications as directed. Please refer to the Current Medication list given to you today.   *If you need a refill on your cardiac medications before your next appointment, please call your pharmacy*   Lab Work: [place]   If you have labs (blood work) drawn today and your tests are completely normal, you will receive your results only by: Marland Kitchen MyChart Message (if you have MyChart) OR . A paper copy in the mail If you have any lab test that is abnormal or we need to change your treatment, we will call you to review the results.   Testing/Procedures: NONE ORDERED  TODAY   Follow-Up: At [facility], you and your health needs are our priority.  As part of our continuing mission to provide you with exceptional heart care, we have created designated Provider Care Teams.  These Care Teams include your primary Cardiologist (physician) and Advanced Practice Providers (APPs -  Physician Assistants and Nurse Practitioners) who all work together to provide you with the care you need, when you need it.  We recommend signing up for the patient portal called "MyChart".  Sign up information is provided on this After Visit Summary.  MyChart is used to connect with patients for Virtual Visits (Telemedicine).  Patients are able to view lab/test results, encounter notes, upcoming appointments, etc.  Non-urgent messages can be sent to your provider as well.   To learn more about what you can do with MyChart, go to [email or web address].    Your next appointment:   6 month(s)  The format for your next appointment:   In Person  Provider:    You may see Legrand Como "Jonni Sanger" Tillery, PA-C    Other Instructions  TAKE BLOOD PRESSURE TWICE DAY FOR 2 WEEKS AND CALL CLINIC  [place] [phone] WITH READINGS PER ANDREW Chalmers Cater

## 2019-09-20 ENCOUNTER — Telehealth: Payer: Self-pay

## 2019-09-20 ENCOUNTER — Ambulatory Visit (INDEPENDENT_AMBULATORY_CARE_PROVIDER_SITE_OTHER): Payer: Medicare Other

## 2019-09-20 DIAGNOSIS — Z95810 Presence of automatic (implantable) cardiac defibrillator: Secondary | ICD-10-CM

## 2019-09-20 DIAGNOSIS — I5022 Chronic systolic (congestive) heart failure: Secondary | ICD-10-CM

## 2019-09-20 NOTE — Progress Notes (Signed)
EPIC Encounter for ICM Monitoring  Patient Name: Benjamin Cook is a 74 y.o. male Date: 09/20/2019 Primary Care Physican: Patient, No Pcp Per Primary Cardiologist:Crenshaw Electrophysiologist: Allred Lastweight:147 lbs   Attempted call to patient and unable to reach.  Transmission reviewed.   CorVue thoracic impedancesuggesting possible fluid accumulation since 09/19/2019.  Prescribed:  Furosemide 40 mg0.5tablet (20 mg total)daily.   Potassium 20 mEq 1 tablet daily.  Labs: 05/07/2019 Creatinine 0.94, BUN 6, Potassium 4.2, Sodium 140, GFR 79-91 04/21/2019 Creatinine 0.97, BUN 8, Potassium 4.2, Sodium 139, GFR >60 02/05/2021Creatinine 0.94, BUN7, Potassium4.1, Sodium 139, [id number]  Recommendations:Unable to reach.  Will advise patient to increase Furosemide x 3 days if reached.   Follow-up plan: ICM clinic phone appointment on8/24/2021 to recheck fluid levels. 91 day device clinic remote transmission 10/26/2019.   EP/Cardiology Office Visits:Recall for 10/10/2019 withDr. [name] and 03/08/2020 with Oda Kilts, PA.   Copy of ICM check sent to Dr.Allred and Dr [name].   3 month ICM trend: 09/20/2019    1 Year ICM trend:       [name], RN 09/20/2019 1:42 PM

## 2019-09-20 NOTE — Telephone Encounter (Signed)
Remote ICM transmission received.  Attempted call to patient regarding ICM remote transmission and no answer or answering machine. 

## 2019-09-23 ENCOUNTER — Telehealth: Payer: Self-pay | Admitting: *Deleted

## 2019-09-23 NOTE — Telephone Encounter (Signed)
Per the wife, she will have her husband  call and schedule his appointment.

## 2019-09-28 ENCOUNTER — Ambulatory Visit (INDEPENDENT_AMBULATORY_CARE_PROVIDER_SITE_OTHER): Payer: Medicare Other

## 2019-09-28 DIAGNOSIS — Z95810 Presence of automatic (implantable) cardiac defibrillator: Secondary | ICD-10-CM

## 2019-09-28 DIAGNOSIS — I5022 Chronic systolic (congestive) heart failure: Secondary | ICD-10-CM

## 2019-10-01 NOTE — Progress Notes (Signed)
EPIC Encounter for ICM Monitoring  Patient Name: Benjamin Cook is a 75 y.o. male Date: 10/01/2019 Primary Care Physican: Patient, No Pcp Per Primary Cardiologist:Crenshaw Electrophysiologist: Allred Lastweight:147 lbs   Transmission reviewed.   CorVue thoracic impedancereturned to normal.  Prescribed:  Furosemide 40 mg0.5tablet (20 mg total)daily.   Potassium 20 mEq 1 tablet daily.  Labs: 05/07/2019 Creatinine 0.94, BUN 6, Potassium 4.2, Sodium 140, GFR 79-91 04/21/2019 Creatinine 0.97, BUN 8, Potassium 4.2, Sodium 139, GFR >60 02/05/2021Creatinine 0.94, BUN7, Potassium4.1, Sodium 139, [id number]  Recommendations:No changes  Follow-up plan: ICM clinic phone appointment on9/22/2021. 91 day device clinic remote transmission 10/26/2019.   EP/Cardiology Office Visits:Recall for 10/10/2019 withDr. [name] and 03/08/2020 with Oda Kilts, PA.   Copy of ICM check sent to Dr.Allred and Dr [name].   3 month ICM trend: 09/28/2019    1 Year ICM trend:       [name], RN 10/01/2019 10:50 AM

## 2019-10-15 ENCOUNTER — Telehealth: Payer: Medicare Other | Admitting: Cardiology

## 2019-10-26 ENCOUNTER — Ambulatory Visit (INDEPENDENT_AMBULATORY_CARE_PROVIDER_SITE_OTHER): Payer: Medicare Other | Admitting: *Deleted

## 2019-10-26 DIAGNOSIS — I255 Ischemic cardiomyopathy: Secondary | ICD-10-CM

## 2019-10-27 ENCOUNTER — Ambulatory Visit (INDEPENDENT_AMBULATORY_CARE_PROVIDER_SITE_OTHER): Payer: Medicare Other

## 2019-10-27 DIAGNOSIS — I5022 Chronic systolic (congestive) heart failure: Secondary | ICD-10-CM | POA: Diagnosis not present

## 2019-10-27 DIAGNOSIS — Z95810 Presence of automatic (implantable) cardiac defibrillator: Secondary | ICD-10-CM | POA: Diagnosis not present

## 2019-10-27 LAB — CUP PACEART REMOTE DEVICE CHECK
Battery Remaining Longevity: 41 mo
Battery Remaining Percentage: 42 %
Battery Voltage: 2.95 V
Brady Statistic AP VP Percent: 1 %
Brady Statistic AP VS Percent: 24 %
Brady Statistic AS VP Percent: 1 %
Brady Statistic AS VS Percent: 73 %
Brady Statistic RA Percent Paced: 19 %
Brady Statistic RV Percent Paced: 1 %
Date Time Interrogation Session: 20210921195222
HighPow Impedance: 71 Ohm
HighPow Impedance: 71 Ohm
Implantable Lead Implant Date: 20120420
Implantable Lead Implant Date: 20180613
Implantable Lead Location: 753859
Implantable Lead Location: 753860
Implantable Pulse Generator Implant Date: 20160212
Lead Channel Impedance Value: 380 Ohm
Lead Channel Impedance Value: 630 Ohm
Lead Channel Pacing Threshold Amplitude: 0.5 V
Lead Channel Pacing Threshold Amplitude: 0.5 V
Lead Channel Pacing Threshold Pulse Width: 0.5 ms
Lead Channel Pacing Threshold Pulse Width: 0.5 ms
Lead Channel Sensing Intrinsic Amplitude: 11.6 mV
Lead Channel Sensing Intrinsic Amplitude: 3 mV
Lead Channel Setting Pacing Amplitude: 2 V
Lead Channel Setting Pacing Amplitude: 2.5 V
Lead Channel Setting Pacing Pulse Width: 0.5 ms
Lead Channel Setting Sensing Sensitivity: 0.5 mV
Pulse Gen Serial Number: 7225079

## 2019-10-27 NOTE — Progress Notes (Signed)
Remote ICD transmission.   

## 2019-10-29 NOTE — Progress Notes (Signed)
EPIC Encounter for ICM Monitoring  Patient Name: Benjamin Cook is a 74 y.o. male Date: 10/29/2019 Primary Care Physican: Patient, No Pcp Per Primary Cardiologist:Crenshaw Electrophysiologist: Allred 09/10/2019 Office weight:146 lbs   Spoke with wife and reports patient is doing very well.  He continues to do household routines like mow the yard.  Denies fluid symptoms.    CorVue thoracic impedancenormal.  Prescribed:  Furosemide 40 mg0.5tablet (20 mg total)daily.   Potassium 20 mEq 1 tablet daily.  Labs: 05/07/2019 Creatinine 0.94, BUN 6, Potassium 4.2, Sodium 140, GFR 79-91 04/21/2019 Creatinine 0.97, BUN 8, Potassium 4.2, Sodium 139, GFR >60 02/05/2021Creatinine 0.94, BUN7, Potassium4.1, Sodium 139, [id number]  Recommendations:No changes and encouraged to call if experiencing any fluid symptoms.  Advised to call Dr Jacalyn Lefevre office to make routine appointment.  Follow-up plan: ICM clinic phone appointment on10/25/2021. 91 day device clinic remote transmission 01/26/2020.   EP/Cardiology Office Visits:Recall for 10/10/2019 withDr. [name] and 03/08/2020 with Oda Kilts, PA.  Copy of ICM check sent to Dr.Allred   3 month ICM trend: 10/29/2019    1 Year ICM trend:       [name], RN 10/29/2019 3:36 PM

## 2019-11-01 ENCOUNTER — Telehealth: Payer: Self-pay

## 2019-11-01 NOTE — Telephone Encounter (Signed)
Alert VT event 10/30/19, EGM shows VT 240bpm, 6 attempts of ATP unsuccessful, 36j shock terminated.   Pt meds include Carvedilol, mid dose Entresto, Xarelto.   Spoke with pt spouse.  She states pt was aware of shock.  At the time of the episode he was at his son's house assisting with yardwork.  Pt was symptomatic as he did not feel well, was lightheaded/ dizzy.  She denies report of patient passing out.  She says she brought him home from their son's house, at that point she noted he had not taken any meds that day so she gave him his morning meds and he rested for the remainder of the day.  She says pt reports a slight stinging sensation in his heart since the shock but denies any other symptoms.  Advised pt should schedule an upcoming appt with APP or MD for f/u on shock.   Reviewed ED precautions and shock plan.  Also advised of DMV regulation, she v/u that pt cannot operate a motor vehicle for 6 months following this treated event.

## 2019-11-16 NOTE — Progress Notes (Signed)
Electrophysiology Office Note Date: 11/17/2019  ID:  Benjamin Cook, DOB 07-05-1945, MRN [id number]  PCP: Patient, No Pcp Per Primary Cardiologist: Kirk Ruths, MD Electrophysiologist: Thompson Grayer, MD    CC: Routine ICD follow-up  Benjamin Cook is a 74 y.o. male seen today for Thompson Grayer, MD for acute visit due to VT with ICD therapy.  Since last being seen in our clinic the patient reports doing OK. He was out working in his sons yard 9/25 when he was shocked for VT. He states he had been out in the heat most of the day with poor hydration. He also states he realized afterward he had put an extra shirt on. He denies chest pain, and has never had CP even with h/o of stenting. He did have associated lightheadedness.  he denies chest pain, palpitations, dyspnea, PND, orthopnea, nausea, vomiting, dizziness, syncope, edema, weight gain, or early satiety.   Device History: St. Jude Dual Chamber ICD implanted 2016, lead revision 07/2016 for chronic systolic CHF History of appropriate therapy: Yes History of AAD therapy: No   Past Medical History:  Diagnosis Date  . Adrenal mass ([place])    per pt this is remote (10 years) and benign by biopsy  . AICD (automatic cardioverter/defibrillator) present   . Anxiety   . Arthritis   . CAD (coronary artery disease)    a. s/p PCI to LAD 2001 b. myoview 2014 high risk with scar LAD/RCA territory but no ischemia  . Cardiomyopathy, ischemic   . CHF (congestive heart failure) ([name])    class II/III  . COPD (chronic obstructive pulmonary disease) ([name])    smokes cigars but has quit cigarettes  . Defibrillator discharge 04/21/2019  . Dyspnea   . Flu 03/2015  . HTN (hypertension)   . Hyperlipidemia   . NSVT (nonsustained ventricular tachycardia) ([place]) 03/05/2017  . Paroxysmal atrial fibrillation ([name]) 03/2015   chads2vasc score is 4  . PSA (psoriatic arthritis) ([place])    increased  . PVD (peripheral vascular disease) ([place])    Past  Surgical History:  Procedure Laterality Date  . ABDOMINAL AORTIC ENDOVASCULAR STENT GRAFT N/A 03/03/2017   Procedure: ABDOMINAL AORTIC ENDOVASCULAR STENT GRAFT;  Surgeon: Waynetta Sandy, MD;  Location: [facility];  Service: Vascular;  Laterality: N/A;  . cataract surgery    . CORONARY ANGIOPLASTY WITH STENT PLACEMENT  2001   a. PCI to LAD  . IMPLANTABLE CARDIOVERTER DEFIBRILLATOR (ICD) GENERATOR CHANGE N/A 03/18/2014   a. SJM Fortify ST DR ICD implanted by [facility] for primary prevention b. gen change 03/2014   . LEAD REVISION/REPAIR N/A 07/17/2016   Procedure: Lead Revision/Repair;  Surgeon: Evans Lance, MD;  Location: [facility];  Service: Cardiovascular;  Laterality: N/A;  . LEFT HEART CATHETERIZATION WITH CORONARY ANGIOGRAM N/A 05/27/2014   Procedure: LEFT HEART CATHETERIZATION WITH CORONARY ANGIOGRAM;  Surgeon: Sherren Mocha, MD;  Location: [facility] CATH LAB;  Service: Cardiovascular;  Laterality: N/A;    Current Outpatient Medications  Medication Sig Dispense Refill  . acetaminophen (TYLENOL) 500 MG tablet Take 500-1,000 mg by mouth every 8 (eight) hours as needed (for pain).     Marland Kitchen atorvastatin (LIPITOR) 80 MG tablet Take 1 tablet (80 mg total) by mouth daily at 6 PM. 90 tablet 3  . carvedilol (COREG) 25 MG tablet TAKE 1 TABLET BY MOUTH  TWICE DAILY WITH MEALS 180 tablet 3  . Fluticasone-Salmeterol (ADVAIR) 100-50 MCG/DOSE AEPB Inhale 1 puff into the lungs 2 (two) times daily.  1 each 3  . furosemide (LASIX) 40 MG tablet TAKE ONE-HALF TABLET BY  MOUTH DAILY 45 tablet 3  . Multiple Vitamin (MULTIVITAMIN) capsule Take 1 capsule by mouth daily.      . Omega-3 Fatty Acids (FISH OIL) 1200 MG CAPS Take 1,200 mg by mouth daily.    . potassium chloride SA (KLOR-CON) 20 MEQ tablet TAKE 1 TABLET BY MOUTH  DAILY 90 tablet 3  . sacubitril-valsartan (ENTRESTO) 49-51 MG Take 1 tablet by mouth 2 (two) times daily. 60 tablet 12  . XARELTO 20 MG TABS tablet TAKE 1 TABLET (20 MG TOTAL) BY MOUTH DAILY  WITH SUPPER. 90 tablet 1  . hydrALAZINE (APRESOLINE) 25 MG tablet Take 1 tablet (25 mg total) by mouth 3 (three) times daily. 270 tablet 3  . isosorbide mononitrate (IMDUR) 30 MG 24 hr tablet Take 0.5 tablets (15 mg total) by mouth daily. 45 tablet 3   No current facility-administered medications for this visit.    Allergies:   Patient has no known allergies.   Social History: Social History   Socioeconomic History  . Marital status: Married    Spouse name: Not on file  . Number of children: Not on file  . Years of education: Not on file  . Highest education level: Not on file  Occupational History  . Occupation: Lawn care  Tobacco Use  . Smoking status: Current Every Day Smoker    Types: Cigars  . Smokeless tobacco: Never Used  . Tobacco comment: smokes cigars now, no longer smokes cigarettes but previously smoked 2ppd  Vaping Use  . Vaping Use: Never used  Substance and Sexual Activity  . Alcohol use: No  . Drug use: No  . Sexual activity: Not Currently    Partners: Female    Birth control/protection: None  Other Topics Concern  . Not on file  Social History Narrative  . Not on file   Social Determinants of Health   Financial Resource Strain:   . Difficulty of Paying Living Expenses: Not on file  Food Insecurity:   . Worried About Charity fundraiser in the Last Year: Not on file  . Ran Out of Food in the Last Year: Not on file  Transportation Needs:   . Lack of Transportation (Medical): Not on file  . Lack of Transportation (Non-Medical): Not on file  Physical Activity:   . Days of Exercise per Week: Not on file  . Minutes of Exercise per Session: Not on file  Stress:   . Feeling of Stress : Not on file  Social Connections:   . Frequency of Communication with Friends and Family: Not on file  . Frequency of Social Gatherings with Friends and Family: Not on file  . Attends Religious Services: Not on file  . Active Member of Clubs or Organizations: Not on file   . Attends Archivist Meetings: Not on file  . Marital Status: Not on file  Intimate Partner Violence:   . Fear of Current or Ex-Partner: Not on file  . Emotionally Abused: Not on file  . Physically Abused: Not on file  . Sexually Abused: Not on file    Family History: Family History  Problem Relation Age of Onset  . Cirrhosis Mother        due to ETOH  . Cancer Neg Hx     Review of Systems: All other systems reviewed and are otherwise negative except as noted above.   Physical Exam: Vitals:  11/17/19 0918  BP: 138/70  Pulse: 83  SpO2: 97%  Weight: 145 lb (65.8 kg)  Height: 5' 11" (1.803 m)     GEN- The patient is well appearing, alert and oriented x 3 today.   HEENT: normocephalic, atraumatic; sclera clear, conjunctiva pink; hearing intact; oropharynx clear; neck supple, no JVP Lymph- no cervical lymphadenopathy Lungs- Clear to ausculation bilaterally, normal work of breathing.  No wheezes, rales, rhonchi Heart- Regular rate and rhythm, no murmurs, rubs or gallops, PMI not laterally displaced GI- soft, non-tender, non-distended, bowel sounds present, no hepatosplenomegaly Extremities- no clubbing or cyanosis. No edema; DP/PT/radial pulses 2+ bilaterally MS- no significant deformity or atrophy Skin- warm and dry, no rash or lesion; ICD pocket well healed Psych- euthymic mood, full affect Neuro- strength and sensation are intact  ICD interrogation- reviewed in detail today,  See PACEART report  EKG:  EKG is not ordered today.  Recent Labs: 04/21/2019: Hemoglobin 15.4; Platelets 202 05/07/2019: BUN 6; Creatinine, Ser 0.94; Magnesium 2.0; Potassium 4.2; Sodium 140; TSH 0.757   Wt Readings from Last 3 Encounters:  11/17/19 145 lb (65.8 kg)  09/10/19 146 lb (66.2 kg)  05/07/19 144 lb 12.8 oz (65.7 kg)     Other studies Reviewed: Additional studies/ records that were reviewed today include:  Previous EP office notes.  Echo 04/2019 LVEF 35%.  Notes report  stable cath 2016 s/p VT at that time.    Assessment and Plan:  1.  Chronic systolic dysfunction s/p Medtronic dual chamber ICD  Echo 04/2019 LVEF 35% euvolemic today Continue entresto and coreg Add hydralazine 25 mg TID and imdur 15 mg daily (Refuses bidil due to cost).  Consider spiro Normal ICD function See Claudia Desanctis Art report No changes today With recent clarification of Barostim indications, he may eventually be a candidate despite widened QRS in RBBB pattern, (currently NYHA II symptoms, with sub-optimal GDMT, and having VT).   2. Monomorphic VT Noted on admission 04/22/2019 and again 9/28.  Now on Entresto.  ATP zones adjusted today with failure to successfully treat most recent episode.  With recurrent VT, will repeat Myoview (2014). He had LHC in 2016 that showed stable CAD with no target lesion. We discussed addition of amiodarone and it's monitoring. He would prefer to r/o potentially reversible causes first and optimize HF medications. We discussed that he is at risk of further VT and we reviewed the NCMV guidelines of no driving x 6 months after appropriate therapy.   3. HTN Elevated today, but prefers not to go up on Entresto at this time.  Pt will check BP BID for next 2 weeks and update [place].   4. CAD Denies ischemic symptoms.  Consider additional work up if VT recurs.  Stable cath 05/2014, which was most recent ischemic w/u.  5. Paroxysmal AF Low burden Stable on Xarelto. No bleeding.   Current medicines are reviewed at length with the patient today.   The patient does not have concerns regarding his medicines.  The following changes were made today:  Add hydral/imdur as above.   Labs/ tests ordered today include:  Orders Placed This Encounter  Procedures  . Comp Met (CMET)  . Magnesium  . MYOCARDIAL PERFUSION IMAGING   Disposition:   Follow up with EP APP  2 months  Signed, Shirley Friar, PA-C  11/17/2019 12:41 PM  [place] [id number] [phone] (office) [phone] (fax)

## 2019-11-17 ENCOUNTER — Other Ambulatory Visit: Payer: Self-pay

## 2019-11-17 ENCOUNTER — Ambulatory Visit: Payer: Medicare Other | Admitting: Student

## 2019-11-17 ENCOUNTER — Encounter: Payer: Self-pay | Admitting: Student

## 2019-11-17 VITALS — BP 138/70 | HR 83 | Ht 71.0 in | Wt 145.0 lb

## 2019-11-17 DIAGNOSIS — I472 Ventricular tachycardia, unspecified: Secondary | ICD-10-CM

## 2019-11-17 DIAGNOSIS — I5022 Chronic systolic (congestive) heart failure: Secondary | ICD-10-CM

## 2019-11-17 DIAGNOSIS — I480 Paroxysmal atrial fibrillation: Secondary | ICD-10-CM

## 2019-11-17 DIAGNOSIS — I255 Ischemic cardiomyopathy: Secondary | ICD-10-CM

## 2019-11-17 DIAGNOSIS — Z95810 Presence of automatic (implantable) cardiac defibrillator: Secondary | ICD-10-CM

## 2019-11-17 DIAGNOSIS — I2510 Atherosclerotic heart disease of native coronary artery without angina pectoris: Secondary | ICD-10-CM | POA: Diagnosis not present

## 2019-11-17 LAB — MAGNESIUM: Magnesium: 1.9 mg/dL (ref 1.6–2.3)

## 2019-11-17 LAB — COMPREHENSIVE METABOLIC PANEL
ALT: 16 IU/L (ref 0–44)
AST: 20 IU/L (ref 0–40)
Albumin/Globulin Ratio: 1.4 (ref 1.2–2.2)
Albumin: 4.2 g/dL (ref 3.7–4.7)
Alkaline Phosphatase: 86 IU/L (ref 44–121)
BUN/Creatinine Ratio: 8 — ABNORMAL LOW (ref 10–24)
BUN: 7 mg/dL — ABNORMAL LOW (ref 8–27)
Bilirubin Total: 0.5 mg/dL (ref 0.0–1.2)
CO2: 27 mmol/L (ref 20–29)
Calcium: 9.6 mg/dL (ref 8.6–10.2)
Chloride: 100 mmol/L (ref 96–106)
Creatinine, Ser: 0.93 mg/dL (ref 0.76–1.27)
GFR calc Af Amer: 92 mL/min/{1.73_m2} (ref >59)
GFR calc non Af Amer: 79 mL/min/{1.73_m2} (ref >59)
Globulin, Total: 3 g/dL (ref 1.5–4.5)
Glucose: 108 mg/dL — ABNORMAL HIGH (ref 65–99)
Potassium: 4 mmol/L (ref 3.5–5.2)
Sodium: 138 mmol/L (ref 134–144)
Total Protein: 7.2 g/dL (ref 6.0–8.5)

## 2019-11-17 LAB — CUP PACEART INCLINIC DEVICE CHECK
Battery Remaining Longevity: 43 mo
Brady Statistic RA Percent Paced: 18 %
Brady Statistic RV Percent Paced: 0.38 %
Date Time Interrogation Session: 20211013123751
HighPow Impedance: 69.75 Ohm
Implantable Lead Implant Date: 20120420
Implantable Lead Implant Date: 20180613
Implantable Lead Location: 753859
Implantable Lead Location: 753860
Implantable Pulse Generator Implant Date: 20160212
Lead Channel Impedance Value: 350 Ohm
Lead Channel Impedance Value: 675 Ohm
Lead Channel Pacing Threshold Amplitude: 0.5 V
Lead Channel Pacing Threshold Amplitude: 0.5 V
Lead Channel Pacing Threshold Amplitude: 0.5 V
Lead Channel Pacing Threshold Amplitude: 0.5 V
Lead Channel Pacing Threshold Pulse Width: 0.5 ms
Lead Channel Pacing Threshold Pulse Width: 0.5 ms
Lead Channel Pacing Threshold Pulse Width: 0.5 ms
Lead Channel Pacing Threshold Pulse Width: 0.5 ms
Lead Channel Sensing Intrinsic Amplitude: 11.6 mV
Lead Channel Sensing Intrinsic Amplitude: 2.5 mV
Lead Channel Setting Pacing Amplitude: 2 V
Lead Channel Setting Pacing Amplitude: 2.5 V
Lead Channel Setting Pacing Pulse Width: 0.5 ms
Lead Channel Setting Sensing Sensitivity: 0.5 mV
Pulse Gen Serial Number: 7225079

## 2019-11-17 MED ORDER — HYDRALAZINE HCL 25 MG PO TABS: 25.0000 mg | ORAL_TABLET | Freq: Three times a day (TID) | ORAL | 3 refills | Status: DC

## 2019-11-17 MED ORDER — ISOSORBIDE MONONITRATE ER 30 MG PO TB24: 15.0000 mg | ORAL_TABLET | Freq: Every day | ORAL | 3 refills | Status: DC

## 2019-11-17 NOTE — Patient Instructions (Addendum)
Medication Instructions:  Your physician has recommended you make the following change in your medication:  -- START Hydralazine (Apresoline) 25 mg - Take 1 tablet (25 mg) by mouth THREE times daily -- NEW RX SENT -- START Isosorbide (Imdur) 15 mg - Take 0.5 tablet (15 mg) by mouth ONCE daily -- NEW RX SENT *If you need a refill on your cardiac medications before your next appointment, please call your pharmacy*  Lab Work: Your physician has recommended that you have lab work today: CMET and Magnesium Level If you have labs (blood work) drawn today and your tests are completely normal, you will receive your results only by:  [place] (if you have [place]) OR  A paper copy in the mail If you have any lab test that is abnormal or we need to change your treatment, we will call you to review the results.  Testing/Procedures: Your physician has requested that you have a lexiscan myoview. For further information please visit [email or web address]. Please follow instruction sheet, as given. -- Please follow the instruction printed for you in the Chemical Stress Test Instruction Letter.   Follow-Up: At [facility], you and your health needs are our priority.  As part of our continuing mission to provide you with exceptional heart care, we have created designated Provider Care Teams.  These Care Teams include your primary Cardiologist (physician) and Advanced Practice Providers (APPs -  Physician Assistants and Nurse Practitioners) who all work together to provide you with the care you need, when you need it.  We recommend signing up for the patient portal called "MyChart".  Sign up information is provided on this After Visit Summary.  MyChart is used to connect with patients for Virtual Visits (Telemedicine).  Patients are able to view lab/test results, encounter notes, upcoming appointments, etc.  Non-urgent messages can be sent to your provider as well.   To learn more about what you can do  with MyChart, go to [email or web address].    Your next appointment:   Your physician recommends that you schedule a follow-up appointment in: 2 MONTHS with Oda Kilts, PA-C.  The format for your next appointment:   In Person with Legrand Como "Jonni Sanger" Chalmers Cater, PA-C

## 2019-11-18 ENCOUNTER — Telehealth (HOSPITAL_COMMUNITY): Payer: Self-pay

## 2019-11-18 NOTE — Telephone Encounter (Signed)
Spoke with the patient's wife. She stated that she understood and would be here for his test. Asked to call back with any questions. S.Shaw Dobek EMTP

## 2019-11-23 ENCOUNTER — Ambulatory Visit (HOSPITAL_COMMUNITY): Payer: Medicare Other | Attending: Internal Medicine

## 2019-11-23 ENCOUNTER — Other Ambulatory Visit: Payer: Self-pay

## 2019-11-23 DIAGNOSIS — I255 Ischemic cardiomyopathy: Secondary | ICD-10-CM

## 2019-11-23 DIAGNOSIS — I2510 Atherosclerotic heart disease of native coronary artery without angina pectoris: Secondary | ICD-10-CM | POA: Diagnosis not present

## 2019-11-23 DIAGNOSIS — I480 Paroxysmal atrial fibrillation: Secondary | ICD-10-CM | POA: Diagnosis not present

## 2019-11-23 LAB — MYOCARDIAL PERFUSION IMAGING
LV dias vol: 191 mL (ref 62–150)
LV sys vol: 150 mL
Peak HR: 90 {beats}/min
Rest HR: 74 {beats}/min
SDS: 0
SRS: 24
SSS: 26
TID: 1.01

## 2019-11-23 MED ORDER — TECHNETIUM TC 99M TETROFOSMIN IV KIT
31.5000 | PACK | Freq: Once | INTRAVENOUS | Status: AC | PRN
  Administered 2019-11-23: 31.5 via INTRAVENOUS
  Filled 2019-11-23: qty 32

## 2019-11-23 MED ORDER — TECHNETIUM TC 99M TETROFOSMIN IV KIT
10.9000 | PACK | Freq: Once | INTRAVENOUS | Status: AC | PRN
  Administered 2019-11-23: 10.9 via INTRAVENOUS
  Filled 2019-11-23: qty 11

## 2019-11-23 MED ORDER — REGADENOSON 0.4 MG/5ML IV SOLN
0.4000 mg | Freq: Once | INTRAVENOUS | Status: AC
  Administered 2019-11-23: 0.4 mg via INTRAVENOUS

## 2019-11-26 ENCOUNTER — Telehealth: Payer: Self-pay

## 2019-11-26 NOTE — Telephone Encounter (Signed)
Per DPR spoke with patients wife who is aware and agreeable to results and recommendations. She is agreeable to maintaining current regiment and follow up

## 2019-11-26 NOTE — Telephone Encounter (Signed)
-----   Message from Shirley Friar, PA-C sent at 11/26/2019  1:18 PM EDT ----- Reviewed with Dr. Margaretann Loveless. No indication for cath. Maximize anti-ischemic therapy and HF medications. Please let patient know this appears stable from previous, current plan will be to continue follow up as scheduled. Legrand Como [address] [place], PA-C  11/26/2019 1:18 PM

## 2019-11-29 ENCOUNTER — Ambulatory Visit (INDEPENDENT_AMBULATORY_CARE_PROVIDER_SITE_OTHER): Payer: Medicare Other

## 2019-11-29 DIAGNOSIS — I255 Ischemic cardiomyopathy: Secondary | ICD-10-CM

## 2019-11-29 DIAGNOSIS — Z95810 Presence of automatic (implantable) cardiac defibrillator: Secondary | ICD-10-CM | POA: Diagnosis not present

## 2019-11-29 NOTE — Progress Notes (Signed)
EPIC Encounter for ICM Monitoring  Patient Name: Benjamin Cook is a 74 y.o. male Date: 11/29/2019 Primary Care Physican: Patient, No Pcp Per Primary Cardiologist:Crenshaw Electrophysiologist: Allred 09/10/2019 Office weight:146 lbs   Spoke with wife and reports feeling well at this time.  Patient is taking a nap but has not complained about any fluid symptoms.    CorVue thoracic impedancesuggesting possible fluid accumulation starting 11/27/2019.  Prescribed:  Furosemide 40 mg0.5tablet (20 mg total)daily.   Potassium 20 mEq take 1 tablet daily.  Labs: 11/17/2019 Creatinine 0.93, BUN 7, Potassium 4.0, Sodium 138, GFR 79-92 05/07/2019 Creatinine 0.94, BUN 6, Potassium 4.2, Sodium 140, GFR 79-91 04/21/2019 Creatinine 0.97, BUN 8, Potassium 4.2, Sodium 139, GFR >60 02/05/2021Creatinine 0.94, BUN7, Potassium4.1, Sodium 139, [id number]  Recommendations:Advised to limit salt intake and will recheck fluid levels next week.  Follow-up plan: ICM clinic phone appointment on11/03/2019 to recheck fluid levels. 91 day device clinic remote transmission 01/26/2020.   EP/Cardiology Office Visits: 01/07/2020 with Oda Kilts, PA.03/17/2020 withDr. [name].  Copy of ICM check sent to Dr.Allred   3 month ICM trend: 11/29/2019    1 Year ICM trend:       [name], RN 11/29/2019 1:05 PM

## 2019-12-07 ENCOUNTER — Ambulatory Visit (INDEPENDENT_AMBULATORY_CARE_PROVIDER_SITE_OTHER): Payer: Medicare Other

## 2019-12-07 DIAGNOSIS — Z95810 Presence of automatic (implantable) cardiac defibrillator: Secondary | ICD-10-CM

## 2019-12-07 DIAGNOSIS — I5022 Chronic systolic (congestive) heart failure: Secondary | ICD-10-CM

## 2019-12-08 NOTE — Progress Notes (Signed)
EPIC Encounter for ICM Monitoring  Patient Name: Benjamin Cook is a 74 y.o. male Date: 12/08/2019 Primary Care Physican: Patient, No Pcp Per Primary Cardiologist:Crenshaw Electrophysiologist: Allred 09/10/2019 Officeweight:146lbs   Spoke with wife and reports feeling well at this time.  Transmission reviewed and advised there may be a little fluid left.  She cooks with salt but does not allow him to use the salt shaker.  Discussed limiting salt to 2000 mg a day which is about 3/4-1 tsp.  Discussed eliminating salt when cooking and replace with dried herbs such as garlic powder, onion powder, basil, oregano, thyme, rosemary etc...  Also may use flavored vinegars.    Encouraged to look at salt content on food labels.  CorVue thoracic impedancesuggesting fluid levels have returned close to baseline since patient took Furosemide 40 mg x 2 days and then returned to 20 mg daily.  Prescribed:  Furosemide 40 mg0.5tablet (20 mg total)daily.   Potassium 20 mEq take 1 tablet daily.  Labs: 11/17/2019 Creatinine 0.93, BUN 7, Potassium 4.0, Sodium 138, GFR 79-92 05/07/2019 Creatinine 0.94, BUN 6, Potassium 4.2, Sodium 140, GFR 79-91 04/21/2019 Creatinine 0.97, BUN 8, Potassium 4.2, Sodium 139, GFR >60 02/05/2021Creatinine 0.94, BUN7, Potassium4.1, Sodium 139, [id number]  Recommendations:Advised to limit salt intake and to call if he experiences any fluid symptoms.  Follow-up plan: ICM clinic phone appointment on11/29/2021 to recheck fluid levels. 91 day device clinic remote transmission12/22/2021.   EP/Cardiology Office Visits: 01/07/2020 with Oda Kilts, PA.03/17/2020 withDr. [name].  Copy of ICM check sent to Dr.Allred   3 month ICM trend: 12/07/2019      1 Year ICM trend:       [name], RN 12/08/2019 11:13 AM

## 2019-12-22 ENCOUNTER — Other Ambulatory Visit: Payer: Self-pay | Admitting: Cardiology

## 2020-01-03 ENCOUNTER — Ambulatory Visit (INDEPENDENT_AMBULATORY_CARE_PROVIDER_SITE_OTHER): Payer: Medicare Other

## 2020-01-03 DIAGNOSIS — I5022 Chronic systolic (congestive) heart failure: Secondary | ICD-10-CM | POA: Diagnosis not present

## 2020-01-03 DIAGNOSIS — Z95810 Presence of automatic (implantable) cardiac defibrillator: Secondary | ICD-10-CM

## 2020-01-04 NOTE — Progress Notes (Signed)
EPIC Encounter for ICM Monitoring  Patient Name: Benjamin Cook is a 74 y.o. male Date: 01/04/2020 Primary Care Physican: Patient, No Pcp Per Primary Cardiologist:Crenshaw Electrophysiologist: Allred 09/10/2019 Officeweight:146lbs    Spoke with wife and patient is feeling well.  She reports he does not have any fluid symptoms.  Transmission reviewed and advised the holiday foods may have contributed to fluid accumulation.  CorVue thoracic impedancesuggesting possible fluid accumulation since 01/01/2020.   Prescribed:  Furosemide 40 mg0.5tablet (20 mg total)daily.   Potassium 20 mEqtake1 tablet daily.  Labs: 11/17/2019 Creatinine 0.93, BUN 7, Potassium 4.0, Sodium 138, GFR 79-92 05/07/2019 Creatinine 0.94, BUN 6, Potassium 4.2, Sodium 140, GFR 79-91 04/21/2019 Creatinine 0.97, BUN 8, Potassium 4.2, Sodium 139, GFR >60 02/05/2021Creatinine 0.94, BUN7, Potassium4.1, Sodium 139, [id number]  Recommendations: Advised to take 1 Furosemide tablet daily x 2 days and then return to 20 mg daily.    Follow-up plan: ICM clinic phone appointment on1/05/2020 since defib will be checked at [place] with Oda Kilts, PA on 01/07/2020. 91 day device clinic remote transmission12/22/2021.   EP/Cardiology Office Visits: 12/3/2021with Oda Kilts, PA.2/11/2022withDr. [name].  Copy of ICM check sent to Dr.Allredand Dr [name] for [facility].   3 month ICM trend: 01/03/2020    1 Year ICM trend:       [name], RN 01/04/2020 4:13 PM

## 2020-01-07 ENCOUNTER — Encounter: Payer: Self-pay | Admitting: Student

## 2020-01-07 ENCOUNTER — Ambulatory Visit: Payer: Medicare Other | Admitting: Student

## 2020-01-07 ENCOUNTER — Other Ambulatory Visit: Payer: Self-pay

## 2020-01-07 VITALS — BP 128/68 | HR 72 | Wt 147.0 lb

## 2020-01-07 DIAGNOSIS — I5022 Chronic systolic (congestive) heart failure: Secondary | ICD-10-CM

## 2020-01-07 DIAGNOSIS — I10 Essential (primary) hypertension: Secondary | ICD-10-CM | POA: Diagnosis not present

## 2020-01-07 DIAGNOSIS — I472 Ventricular tachycardia, unspecified: Secondary | ICD-10-CM

## 2020-01-07 DIAGNOSIS — I2510 Atherosclerotic heart disease of native coronary artery without angina pectoris: Secondary | ICD-10-CM

## 2020-01-07 DIAGNOSIS — I255 Ischemic cardiomyopathy: Secondary | ICD-10-CM | POA: Diagnosis not present

## 2020-01-07 LAB — CUP PACEART INCLINIC DEVICE CHECK
Battery Remaining Longevity: 42 mo
Brady Statistic RA Percent Paced: 12 %
Brady Statistic RV Percent Paced: 0.53 %
Date Time Interrogation Session: 20211203133530
HighPow Impedance: 68.625
Implantable Lead Implant Date: 20120420
Implantable Lead Implant Date: 20180613
Implantable Lead Location: 753859
Implantable Lead Location: 753860
Implantable Pulse Generator Implant Date: 20160212
Lead Channel Impedance Value: 350 Ohm
Lead Channel Impedance Value: 575 Ohm
Lead Channel Pacing Threshold Amplitude: 0.5 V
Lead Channel Pacing Threshold Amplitude: 0.5 V
Lead Channel Pacing Threshold Amplitude: 0.5 V
Lead Channel Pacing Threshold Amplitude: 0.5 V
Lead Channel Pacing Threshold Pulse Width: 0.5 ms
Lead Channel Pacing Threshold Pulse Width: 0.5 ms
Lead Channel Pacing Threshold Pulse Width: 0.5 ms
Lead Channel Pacing Threshold Pulse Width: 0.5 ms
Lead Channel Sensing Intrinsic Amplitude: 11.6 mV
Lead Channel Sensing Intrinsic Amplitude: 2.5 mV
Lead Channel Setting Pacing Amplitude: 2 V
Lead Channel Setting Pacing Amplitude: 2.5 V
Lead Channel Setting Pacing Pulse Width: 0.5 ms
Lead Channel Setting Sensing Sensitivity: 0.5 mV
Pulse Gen Serial Number: 7225079

## 2020-01-07 LAB — BASIC METABOLIC PANEL
BUN/Creatinine Ratio: 10 (ref 10–24)
BUN: 9 mg/dL (ref 8–27)
CO2: 27 mmol/L (ref 20–29)
Calcium: 9.2 mg/dL (ref 8.6–10.2)
Chloride: 101 mmol/L (ref 96–106)
Creatinine, Ser: 0.89 mg/dL (ref 0.76–1.27)
GFR calc Af Amer: 96 mL/min/{1.73_m2} (ref >59)
GFR calc non Af Amer: 83 mL/min/{1.73_m2} (ref >59)
Glucose: 112 mg/dL — ABNORMAL HIGH (ref 65–99)
Potassium: 4 mmol/L (ref 3.5–5.2)
Sodium: 139 mmol/L (ref 134–144)

## 2020-01-07 MED ORDER — SPIRONOLACTONE 25 MG PO TABS: 25.0000 mg | ORAL_TABLET | Freq: Every day | ORAL | 3 refills | Status: DC

## 2020-01-07 MED ORDER — FLUTICASONE-SALMETEROL 100-50 MCG/DOSE IN AEPB
1.0000 | INHALATION_SPRAY | Freq: Two times a day (BID) | RESPIRATORY_TRACT | 0 refills | Status: DC
Start: 2020-01-07 — End: 2022-06-04

## 2020-01-07 NOTE — Progress Notes (Signed)
Electrophysiology Office Note Date: 01/07/2020  ID:  Benjamin Cook, DOB [birth year], MRN [id number]  PCP: Patient, No Pcp Per Primary Cardiologist: Kirk Ruths, MD Electrophysiologist: Thompson Grayer, MD   CC: Routine ICD follow-up  Benjamin Cook is a 74 y.o. male seen today for Thompson Grayer, MD for routine electrophysiology followup.  Since last being seen in our clinic the patient reports doing well overall.  he denies chest pain, palpitations, dyspnea, PND, orthopnea, nausea, vomiting, dizziness, syncope, edema, weight gain, or early satiety. He has not had ICD shocks.   Device History: [employer] ICD implanted 2016 for CHF History of appropriate therapy: Yes History of AAD therapy: No   Past Medical History:  Diagnosis Date  . Adrenal mass ([place])    per pt this is remote (10 years) and benign by biopsy  . AICD (automatic cardioverter/defibrillator) present   . Anxiety   . Arthritis   . CAD (coronary artery disease)    a. s/p PCI to LAD 2001 b. myoview 2014 high risk with scar LAD/RCA territory but no ischemia  . Cardiomyopathy, ischemic   . CHF (congestive heart failure) ([name])    class II/III  . COPD (chronic obstructive pulmonary disease) ([name])    smokes cigars but has quit cigarettes  . Defibrillator discharge 04/21/2019  . Dyspnea   . Flu 03/2015  . HTN (hypertension)   . Hyperlipidemia   . NSVT (nonsustained ventricular tachycardia) ([place]) 03/05/2017  . Paroxysmal atrial fibrillation ([name]) 03/2015   chads2vasc score is 4  . PSA (psoriatic arthritis) ([place])    increased  . PVD (peripheral vascular disease) ([place])    Past Surgical History:  Procedure Laterality Date  . ABDOMINAL AORTIC ENDOVASCULAR STENT GRAFT N/A 03/03/2017   Procedure: ABDOMINAL AORTIC ENDOVASCULAR STENT GRAFT;  Surgeon: Waynetta Sandy, MD;  Location: [facility] [place];  Service: Vascular;  Laterality: N/A;  . cataract surgery    . CORONARY ANGIOPLASTY WITH STENT PLACEMENT  2001    a. PCI to LAD  . IMPLANTABLE CARDIOVERTER DEFIBRILLATOR (ICD) GENERATOR CHANGE N/A 03/18/2014   a. SJM Fortify ST DR ICD implanted by [facility] for primary prevention b. gen change 03/2014   . LEAD REVISION/REPAIR N/A 07/17/2016   Procedure: Lead Revision/Repair;  Surgeon: Evans Lance, MD;  Location: [place] [facility];  Service: Cardiovascular;  Laterality: N/A;  . LEFT HEART CATHETERIZATION WITH CORONARY ANGIOGRAM N/A 05/27/2014   Procedure: LEFT HEART CATHETERIZATION WITH CORONARY ANGIOGRAM;  Surgeon: Sherren Mocha, MD;  Location: [facility] CATH LAB;  Service: Cardiovascular;  Laterality: N/A;    Current Outpatient Medications  Medication Sig Dispense Refill  . acetaminophen (TYLENOL) 500 MG tablet Take 500-1,000 mg by mouth every 8 (eight) hours as needed (for pain).     Marland Kitchen atorvastatin (LIPITOR) 80 MG tablet Take 1 tablet (80 mg total) by mouth daily at 6 PM. 90 tablet 3  . carvedilol (COREG) 25 MG tablet TAKE 1 TABLET BY MOUTH  TWICE DAILY WITH MEALS 180 tablet 3  . Fluticasone-Salmeterol (ADVAIR) 100-50 MCG/DOSE AEPB Inhale 1 puff into the lungs 2 (two) times daily. 1 each 0  . furosemide (LASIX) 40 MG tablet TAKE ONE-HALF TABLET BY  MOUTH DAILY 45 tablet 3  . hydrALAZINE (APRESOLINE) 25 MG tablet Take 1 tablet (25 mg total) by mouth 3 (three) times daily. 270 tablet 3  . isosorbide mononitrate (IMDUR) 30 MG 24 hr tablet Take 0.5 tablets (15 mg total) by mouth daily. 45 tablet 3  . Multiple  Vitamin (MULTIVITAMIN) capsule Take 1 capsule by mouth daily.      . Omega-3 Fatty Acids (FISH OIL) 1200 MG CAPS Take 1,200 mg by mouth daily.    . potassium chloride SA (KLOR-CON) 20 MEQ tablet TAKE 1 TABLET BY MOUTH  DAILY 90 tablet 3  . sacubitril-valsartan (ENTRESTO) 49-51 MG Take 1 tablet by mouth 2 (two) times daily. 60 tablet 12  . XARELTO 20 MG TABS tablet TAKE 1 TABLET (20 MG TOTAL) BY MOUTH DAILY WITH SUPPER. 90 tablet 1  . spironolactone (ALDACTONE) 25 MG tablet Take 1 tablet (25 mg total) by mouth  at bedtime. 90 tablet 3   No current facility-administered medications for this visit.    Allergies:   Patient has no known allergies.   Social History: Social History   Socioeconomic History  . Marital status: Married    Spouse name: Not on file  . Number of children: Not on file  . Years of education: Not on file  . Highest education level: Not on file  Occupational History  . Occupation: Lawn care  Tobacco Use  . Smoking status: Current Every Day Smoker    Types: Cigars  . Smokeless tobacco: Never Used  . Tobacco comment: smokes cigars now, no longer smokes cigarettes but previously smoked 2ppd  Vaping Use  . Vaping Use: Never used  Substance and Sexual Activity  . Alcohol use: No  . Drug use: No  . Sexual activity: Not Currently    Partners: Female    Birth control/protection: None  Other Topics Concern  . Not on file  Social History Narrative  . Not on file   Social Determinants of Health   Financial Resource Strain:   . Difficulty of Paying Living Expenses: Not on file  Food Insecurity:   . Worried About Charity fundraiser in the Last Year: Not on file  . Ran Out of Food in the Last Year: Not on file  Transportation Needs:   . Lack of Transportation (Medical): Not on file  . Lack of Transportation (Non-Medical): Not on file  Physical Activity:   . Days of Exercise per Week: Not on file  . Minutes of Exercise per Session: Not on file  Stress:   . Feeling of Stress : Not on file  Social Connections:   . Frequency of Communication with Friends and Family: Not on file  . Frequency of Social Gatherings with Friends and Family: Not on file  . Attends Religious Services: Not on file  . Active Member of Clubs or Organizations: Not on file  . Attends Archivist Meetings: Not on file  . Marital Status: Not on file  Intimate Partner Violence:   . Fear of Current or Ex-Partner: Not on file  . Emotionally Abused: Not on file  . Physically Abused: Not  on file  . Sexually Abused: Not on file    Family History: Family History  Problem Relation Age of Onset  . Cirrhosis Mother        due to ETOH  . Cancer Neg Hx     Review of Systems: All other systems reviewed and are otherwise negative except as noted above.   Physical Exam: Vitals:   01/07/20 1032  BP: 128/68  Pulse: 72  SpO2: 95%  Weight: 147 lb (66.7 kg)     GEN- The patient is well appearing, alert and oriented x 3 today.   HEENT: normocephalic, atraumatic; sclera clear, conjunctiva pink; hearing intact; oropharynx clear; neck  supple, no JVP Lymph- no cervical lymphadenopathy Lungs- Clear to ausculation bilaterally, normal work of breathing.  No wheezes, rales, rhonchi Heart- Regular rate and rhythm, no murmurs, rubs or gallops, PMI not laterally displaced GI- soft, non-tender, non-distended, bowel sounds present, no hepatosplenomegaly Extremities- no clubbing or cyanosis. No edema; DP/PT/radial pulses 2+ bilaterally MS- no significant deformity or atrophy Skin- warm and dry, no rash or lesion; ICD pocket well healed Psych- euthymic mood, full affect Neuro- strength and sensation are intact  ICD interrogation- reviewed in detail today,  See PACEART report  EKG:  EKG is not ordered today.  Recent Labs: 04/21/2019: Hemoglobin 15.4; Platelets 202 05/07/2019: TSH 0.757 11/17/2019: ALT 16; BUN 7; Creatinine, Ser 0.93; Magnesium 1.9; Potassium 4.0; Sodium 138   Wt Readings from Last 3 Encounters:  01/07/20 147 lb (66.7 kg)  11/23/19 145 lb (65.8 kg)  11/17/19 145 lb (65.8 kg)     Other studies Reviewed: Additional studies/ records that were reviewed today include: Previous EP notes   Myoview 11/2019 Nuclear stress EF: 22%.  No T wave inversion was noted during stress.  There was no ST segment deviation noted during stress.  Defect 1: There is a large defect of severe severity present in the basal inferior, mid anteroseptal, mid inferoseptal, mid inferior,  apical septal, apical inferior, apical lateral and apex location.  Findings consistent with prior myocardial infarction with peri-infarct ischemia.  This is a high risk study.  The left ventricular ejection fraction is severely decreased (<30%).   Large size, severe severity mostly fixed (SDS 3) apical to mid inferior, apical, apical septal and anteroapical perfusion defect suggestive of mid to distal LAD and RCA territory scar with minimal peri-infarct ischemia. LVEF 22% with akinesis of the previously mentioned walls. This is a high risk study. Compared to a prior study on 12/21/2012, there is no significant change.   Assessment and Plan:  1.  Chronic systolic dysfunction s/p Medtronic dual chamber ICD  Echo 04/2019 LVEF 35% euvolemic today Continue entresto and coreg Continue hydralazine 25 mg TID and imdur 15 mg daily (Refuses bidil due to cost).  Add spiro 25 mg daily. BMET today and 10 days.  Normal ICD function.  See Claudia Desanctis Art report No changes today.  With recent clarification of Barostim indications, he may eventually be a candidate despite widened QRS in RBBB pattern, (currently NYHA II symptoms, with sub-optimal GDMT, and having VT).   2. Monomorphic VT Noted on admission 04/22/2019 and again 9/28.  Now on Entresto.  No further.  Myoview stable from 2014. Previous scar. EF low as above.  Consider AAD with any recurrent VT.  He does not drive.   3. HTN Adding spiro as above.   4. CAD No s/s ischemia.  Stable myoview.  Stable cath 05/2014, which was most recent ischemic w/u.  5. Paroxysmal AF Low burden.  Stable on Xarelto. Denies bleeding.   Current medicines are reviewed at length with the patient today.   The patient does not have concerns regarding his medicines.  The following changes were made today:  Added spiro   Labs/ tests ordered today include:  Orders Placed This Encounter  Procedures  . Basic metabolic panel  . Basic metabolic panel      Disposition:   Follow up with EP APP 2 months for continued med titration.    Jacalyn Lefevre, PA-C  01/07/2020 1:37 PM  [address] [place] [address] [phone] (office) [phone] (fax)

## 2020-01-07 NOTE — Patient Instructions (Addendum)
Medication Instructions:  Start Spirolactone (Aldactone) 25 mg daily at bedtime   *If you need a refill on your cardiac medications before your next appointment, please call your pharmacy*   Lab Work: Bmp- Today   Your physician recommends that you return for lab work in on 01/17/20  If you have labs (blood work) drawn today and your tests are completely normal, you will receive your results only by: Marland Kitchen MyChart Message (if you have MyChart) OR . A paper copy in the mail If you have any lab test that is abnormal or we need to change your treatment, we will call you to review the results.   Testing/Procedures: None ordered    Follow-Up: Follow up in 2 months    Other Instructions None

## 2020-01-17 ENCOUNTER — Other Ambulatory Visit: Payer: Self-pay

## 2020-01-17 ENCOUNTER — Other Ambulatory Visit: Payer: Medicare Other | Admitting: *Deleted

## 2020-01-17 DIAGNOSIS — I10 Essential (primary) hypertension: Secondary | ICD-10-CM

## 2020-01-18 LAB — BASIC METABOLIC PANEL
BUN/Creatinine Ratio: 6 — ABNORMAL LOW (ref 10–24)
BUN: 5 mg/dL — ABNORMAL LOW (ref 8–27)
CO2: 27 mmol/L (ref 20–29)
Calcium: 9.5 mg/dL (ref 8.6–10.2)
Chloride: 104 mmol/L (ref 96–106)
Creatinine, Ser: 0.79 mg/dL (ref 0.76–1.27)
GFR calc Af Amer: 101 mL/min/{1.73_m2} (ref >59)
GFR calc non Af Amer: 87 mL/min/{1.73_m2} (ref >59)
Glucose: 91 mg/dL (ref 65–99)
Potassium: 4.1 mmol/L (ref 3.5–5.2)
Sodium: 143 mmol/L (ref 134–144)

## 2020-01-26 ENCOUNTER — Ambulatory Visit (INDEPENDENT_AMBULATORY_CARE_PROVIDER_SITE_OTHER): Payer: Medicare Other

## 2020-01-26 DIAGNOSIS — I255 Ischemic cardiomyopathy: Secondary | ICD-10-CM

## 2020-01-26 DIAGNOSIS — I5022 Chronic systolic (congestive) heart failure: Secondary | ICD-10-CM

## 2020-01-30 LAB — CUP PACEART REMOTE DEVICE CHECK
Battery Remaining Longevity: 38 mo
Battery Remaining Percentage: 39 %
Battery Voltage: 2.95 V
Brady Statistic AP VP Percent: 1 %
Brady Statistic AP VS Percent: 30 %
Brady Statistic AS VP Percent: 1 %
Brady Statistic AS VS Percent: 62 %
Brady Statistic RA Percent Paced: 18 %
Brady Statistic RV Percent Paced: 1 %
Date Time Interrogation Session: 20211221172330
HighPow Impedance: 65 Ohm
HighPow Impedance: 65 Ohm
Implantable Lead Implant Date: 20120420
Implantable Lead Implant Date: 20180613
Implantable Lead Location: 753859
Implantable Lead Location: 753860
Implantable Pulse Generator Implant Date: 20160212
Lead Channel Impedance Value: 340 Ohm
Lead Channel Impedance Value: 600 Ohm
Lead Channel Pacing Threshold Amplitude: 0.5 V
Lead Channel Pacing Threshold Amplitude: 0.5 V
Lead Channel Pacing Threshold Pulse Width: 0.5 ms
Lead Channel Pacing Threshold Pulse Width: 0.5 ms
Lead Channel Sensing Intrinsic Amplitude: 11.6 mV
Lead Channel Sensing Intrinsic Amplitude: 2 mV
Lead Channel Setting Pacing Amplitude: 2 V
Lead Channel Setting Pacing Amplitude: 2.5 V
Lead Channel Setting Pacing Pulse Width: 0.5 ms
Lead Channel Setting Sensing Sensitivity: 0.5 mV
Pulse Gen Serial Number: 7225079

## 2020-02-08 ENCOUNTER — Ambulatory Visit (INDEPENDENT_AMBULATORY_CARE_PROVIDER_SITE_OTHER): Payer: Medicare (Managed Care)

## 2020-02-08 DIAGNOSIS — I5022 Chronic systolic (congestive) heart failure: Secondary | ICD-10-CM | POA: Diagnosis not present

## 2020-02-08 DIAGNOSIS — Z95810 Presence of automatic (implantable) cardiac defibrillator: Secondary | ICD-10-CM | POA: Diagnosis not present

## 2020-02-09 NOTE — Progress Notes (Signed)
Remote ICD transmission.   

## 2020-02-11 NOTE — Progress Notes (Signed)
EPIC Encounter for ICM Monitoring  Patient Name: Benjamin Cook is a 75 y.o. male Date: 02/11/2020 Primary Care Physican: Patient, No Pcp Per Primary Cardiologist:Crenshaw Electrophysiologist: Allred 01/07/2020 Officeweight:147lbs    Transmission Reviewed  CorVue thoracic impedancesuggestingnormal fluid levels.   Prescribed:  Furosemide 40 mg0.5tablet (20 mg total)daily.   Potassium 20 mEqtake1 tablet daily.  Labs: 11/17/2019 Creatinine 0.93, BUN 7, Potassium 4.0, Sodium 138, GFR 79-92 05/07/2019 Creatinine 0.94, BUN 6, Potassium 4.2, Sodium 140, GFR 79-91 04/21/2019 Creatinine 0.97, BUN 8, Potassium 4.2, Sodium 139, GFR >60 02/05/2021Creatinine 0.94, BUN7, Potassium4.1, Sodium 139, [id number]  Recommendations: No changes  Follow-up plan: ICM clinic phone appointment on2/10/2020. 91 day device clinic remote transmission3/23/2022.   EP/Cardiology Office Visits: 03/20/2020 with Oda Kilts, PA.2/11/2022withDr. [name].  Copy of ICM check sent to Dr.Allred.   3 month ICM trend: 02/08/2020.    1 Year ICM trend:       [name], RN 02/11/2020 3:26 PM

## 2020-02-17 ENCOUNTER — Other Ambulatory Visit: Payer: Self-pay | Admitting: Cardiology

## 2020-02-21 ENCOUNTER — Telehealth: Payer: Self-pay

## 2020-02-23 ENCOUNTER — Other Ambulatory Visit: Payer: Self-pay | Admitting: Internal Medicine

## 2020-02-23 ENCOUNTER — Other Ambulatory Visit: Payer: Self-pay | Admitting: Cardiology

## 2020-03-06 NOTE — Progress Notes (Signed)
HPI: FU coronary disease, ischemic cardiomyopathy,PAF, prior ICD,hypertension and hyperlipidemia. Cardiac history dates back to June of 2001. At that time the patient was found to have an abnormal electrocardiogram. Catheterization revealed a 95% mid LAD and a 70% diffuse right coronary artery. His ejection fraction was 25-30%. He had PCI of his LAD at that time. Last cardiac catheterization April 2016 showed diffuse nonobstructive coronary disease and ejection fraction 35%. Patient also with paroxysmal atrial fibrillation noted on previous ICD interrogation. Echo March 2021 showed ejection fraction 35%, mild left ventricular hypertrophy, grade 1 diastolic dysfunction. Nuclear study October 2021 showed ejection fraction 22%, inferior and apical defect consistent with scar with minimal peri-infarct ischemia.  Also with history of abdominal aortic aneurysm repair.  Since last seen,patient has some dyspnea on exertion but no orthopnea, PND, pedal edema, chest pain, palpitations or syncope.  No bleeding.  Current Outpatient Medications  Medication Sig Dispense Refill  . acetaminophen (TYLENOL) 500 MG tablet Take 500-1,000 mg by mouth every 8 (eight) hours as needed (for pain).     Marland Kitchen atorvastatin (LIPITOR) 80 MG tablet TAKE 1 TABLET BY MOUTH  DAILY AT 6 PM 90 tablet 0  . carvedilol (COREG) 25 MG tablet TAKE 1 TABLET BY MOUTH  TWICE DAILY WITH MEALS 180 tablet 3  . Fluticasone-Salmeterol (ADVAIR) 100-50 MCG/DOSE AEPB Inhale 1 puff into the lungs 2 (two) times daily. 1 each 0  . furosemide (LASIX) 40 MG tablet TAKE ONE-HALF TABLET BY  MOUTH DAILY 45 tablet 3  . isosorbide mononitrate (IMDUR) 30 MG 24 hr tablet Take 0.5 tablets (15 mg total) by mouth daily. 45 tablet 3  . Multiple Vitamin (MULTIVITAMIN) capsule Take 1 capsule by mouth daily.    . Omega-3 Fatty Acids (FISH OIL) 1200 MG CAPS Take 1,200 mg by mouth daily.    . potassium chloride SA (KLOR-CON) 20 MEQ tablet TAKE 1 TABLET BY MOUTH  DAILY  90 tablet 3  . sacubitril-valsartan (ENTRESTO) 49-51 MG Take 1 tablet by mouth 2 (two) times daily. 60 tablet 12  . spironolactone (ALDACTONE) 25 MG tablet Take 1 tablet (25 mg total) by mouth at bedtime. 90 tablet 3  . XARELTO 20 MG TABS tablet TAKE 1 TABLET (20 MG TOTAL) BY MOUTH DAILY WITH SUPPER. 90 tablet 1  . hydrALAZINE (APRESOLINE) 25 MG tablet Take 1 tablet (25 mg total) by mouth 3 (three) times daily. 270 tablet 3   No current facility-administered medications for this visit.     Past Medical History:  Diagnosis Date  . Adrenal mass ([place])    per pt this is remote (10 years) and benign by biopsy  . AICD (automatic cardioverter/defibrillator) present   . Anxiety   . Arthritis   . CAD (coronary artery disease)    a. s/p PCI to LAD 2001 b. myoview 2014 high risk with scar LAD/RCA territory but no ischemia  . Cardiomyopathy, ischemic   . CHF (congestive heart failure) ([name])    class II/III  . COPD (chronic obstructive pulmonary disease) ([name])    smokes cigars but has quit cigarettes  . Defibrillator discharge 04/21/2019  . Dyspnea   . Flu 03/2015  . HTN (hypertension)   . Hyperlipidemia   . NSVT (nonsustained ventricular tachycardia) ([place]) 03/05/2017  . Paroxysmal atrial fibrillation ([name]) 03/2015   chads2vasc score is 4  . PSA (psoriatic arthritis) ([place])    increased  . PVD (peripheral vascular disease) [facility])     Past Surgical History:  Procedure Laterality  Date  . ABDOMINAL AORTIC ENDOVASCULAR STENT GRAFT N/A 03/03/2017   Procedure: ABDOMINAL AORTIC ENDOVASCULAR STENT GRAFT;  Surgeon: Waynetta Sandy, MD;  Location: [place];  Service: Vascular;  Laterality: N/A;  . cataract surgery    . CORONARY ANGIOPLASTY WITH STENT PLACEMENT  2001   a. PCI to LAD  . IMPLANTABLE CARDIOVERTER DEFIBRILLATOR (ICD) GENERATOR CHANGE N/A 03/18/2014   a. SJM Fortify ST DR ICD implanted by [facility] for primary prevention b. gen change 03/2014   . LEAD REVISION/REPAIR N/A 07/17/2016    Procedure: Lead Revision/Repair;  Surgeon: Evans Lance, MD;  Location: [place] [facility];  Service: Cardiovascular;  Laterality: N/A;  . LEFT HEART CATHETERIZATION WITH CORONARY ANGIOGRAM N/A 05/27/2014   Procedure: LEFT HEART CATHETERIZATION WITH CORONARY ANGIOGRAM;  Surgeon: Sherren Mocha, MD;  Location: [facility] CATH LAB;  Service: Cardiovascular;  Laterality: N/A;    Social History   Socioeconomic History  . Marital status: Married    Spouse name: Not on file  . Number of children: Not on file  . Years of education: Not on file  . Highest education level: Not on file  Occupational History  . Occupation: Lawn care  Tobacco Use  . Smoking status: Current Every Day Smoker    Types: Cigars  . Smokeless tobacco: Never Used  . Tobacco comment: smokes cigars now, no longer smokes cigarettes but previously smoked 2ppd  Vaping Use  . Vaping Use: Never used  Substance and Sexual Activity  . Alcohol use: No  . Drug use: No  . Sexual activity: Not Currently    Partners: Female    Birth control/protection: None  Other Topics Concern  . Not on file  Social History Narrative  . Not on file   Social Determinants of Health   Financial Resource Strain: Not on file  Food Insecurity: Not on file  Transportation Needs: Not on file  Physical Activity: Not on file  Stress: Not on file  Social Connections: Not on file  Intimate Partner Violence: Not on file    Family History  Problem Relation Age of Onset  . Cirrhosis Mother        due to ETOH  . Cancer Neg Hx     ROS: no fevers or chills, productive cough, hemoptysis, dysphasia, odynophagia, melena, hematochezia, dysuria, hematuria, rash, seizure activity, orthopnea, PND, pedal edema, claudication. Remaining systems are negative.  Physical Exam: Well-developed well-nourished in no acute distress.  Skin is warm and dry.  HEENT is normal.  Neck is supple.  Chest is clear to auscultation with normal expansion.  Cardiovascular exam  is regular rate and rhythm.  Abdominal exam nontender or distended. No masses palpated. Extremities show no edema. neuro grossly intact  ECG-sinus rhythm with occasional atrial pacing, PVC, right bundle branch block.  Personally reviewed  A/P  1 coronary artery disease-patient doing well with no chest pain.  Plan to continue medical therapy.  Continue statin.  He is not on aspirin given need for Xarelto.  2 paroxysmal atrial fibrillation-patient is in sinus rhythm today.  Continue Xarelto and beta-blocker.  3 ischemic cardiomyopathy-continue Entresto and beta-blocker.  Check potassium and renal function.  4 history of abdominal aortic aneurysm repair-follow-up vascular surgery.  5 history of hyperlipidemia-continue statin.  6 ICD-Per electrophysiology.  7 hypertension-patient's blood pressure is controlled.  Continue present medications.  8 tobacco abuse-patient counseled on discontinuing.  Kirk Ruths, MD

## 2020-03-15 ENCOUNTER — Ambulatory Visit (INDEPENDENT_AMBULATORY_CARE_PROVIDER_SITE_OTHER): Payer: Medicare (Managed Care)

## 2020-03-15 DIAGNOSIS — Z95810 Presence of automatic (implantable) cardiac defibrillator: Secondary | ICD-10-CM

## 2020-03-15 DIAGNOSIS — I5022 Chronic systolic (congestive) heart failure: Secondary | ICD-10-CM

## 2020-03-15 NOTE — Progress Notes (Signed)
EPIC Encounter for ICM Monitoring  Patient Name: Benjamin Cook is a 75 y.o. male Date: 03/15/2020 Primary Care Physican: Patient, No Pcp Per Primary Cardiologist:Crenshaw Electrophysiologist: Allred 01/07/2020 Officeweight:147lbs   Transmission Reviewed  CorVue thoracic impedancesuggestingnormal fluid levels.  Prescribed:  Furosemide 40 mg0.5tablet (20 mg total)daily.   Potassium 20 mEqtake1 tablet daily.  Labs: 01/17/2020 Creatinine 0.79 BUN 5,  Potassium 4.1, Sodium 143, GFR 87-101 01/07/2020 Creatinine 0.89, BUN 9, Potassium 4.0, Sodium 139, GFR 83-96 11/17/2019 Creatinine 0.93, BUN 7, Potassium 4.0, Sodium 138, GFR 79-92 05/07/2019 Creatinine 0.94, BUN 6, Potassium 4.2, Sodium 140, GFR 79-91 04/21/2019 Creatinine 0.97, BUN 8, Potassium 4.2, Sodium 139, GFR >60 02/05/2021Creatinine 0.94, BUN7, Potassium4.1, Sodium 139, [id number]  Recommendations: No changes  Follow-up plan: ICM clinic phone appointment on3/14/2022. 91 day device clinic remote transmission3/23/2022.   EP/Cardiology Office Visits: 03/20/2020 with Oda Kilts, PA.2/11/2022withDr. [name].  Copy of ICM check sent to Dr.Allred.   3 month ICM trend: 03/15/2020.    1 Year ICM trend:       [name], RN 03/15/2020 12:50 PM

## 2020-03-17 ENCOUNTER — Encounter: Payer: Self-pay | Admitting: Cardiology

## 2020-03-17 ENCOUNTER — Other Ambulatory Visit: Payer: Self-pay

## 2020-03-17 ENCOUNTER — Ambulatory Visit: Payer: Medicare (Managed Care) | Admitting: Cardiology

## 2020-03-17 VITALS — BP 110/58 | HR 72 | Ht 71.0 in | Wt 146.0 lb

## 2020-03-17 DIAGNOSIS — I5022 Chronic systolic (congestive) heart failure: Secondary | ICD-10-CM

## 2020-03-17 DIAGNOSIS — I10 Essential (primary) hypertension: Secondary | ICD-10-CM

## 2020-03-17 DIAGNOSIS — I2510 Atherosclerotic heart disease of native coronary artery without angina pectoris: Secondary | ICD-10-CM

## 2020-03-17 DIAGNOSIS — I255 Ischemic cardiomyopathy: Secondary | ICD-10-CM

## 2020-03-17 DIAGNOSIS — I480 Paroxysmal atrial fibrillation: Secondary | ICD-10-CM | POA: Diagnosis not present

## 2020-03-17 NOTE — Patient Instructions (Signed)

## 2020-03-20 ENCOUNTER — Ambulatory Visit: Payer: Medicare (Managed Care) | Admitting: Student

## 2020-03-20 LAB — COMPREHENSIVE METABOLIC PANEL
ALT: 14 IU/L (ref 0–44)
AST: 22 IU/L (ref 0–40)
Albumin/Globulin Ratio: 1.5 (ref 1.2–2.2)
Albumin: 4.1 g/dL (ref 3.7–4.7)
Alkaline Phosphatase: 69 IU/L (ref 44–121)
BUN/Creatinine Ratio: 6 — ABNORMAL LOW (ref 10–24)
BUN: 5 mg/dL — ABNORMAL LOW (ref 8–27)
Bilirubin Total: 0.6 mg/dL (ref 0.0–1.2)
CO2: 26 mmol/L (ref 20–29)
Calcium: 9.1 mg/dL (ref 8.6–10.2)
Chloride: 99 mmol/L (ref 96–106)
Creatinine, Ser: 0.9 mg/dL (ref 0.76–1.27)
GFR calc Af Amer: 96 mL/min/{1.73_m2} (ref >59)
GFR calc non Af Amer: 83 mL/min/{1.73_m2} (ref >59)
Globulin, Total: 2.8 g/dL (ref 1.5–4.5)
Glucose: 105 mg/dL — ABNORMAL HIGH (ref 65–99)
Potassium: 3.7 mmol/L (ref 3.5–5.2)
Sodium: 139 mmol/L (ref 134–144)
Total Protein: 6.9 g/dL (ref 6.0–8.5)

## 2020-03-20 LAB — CBC
Hematocrit: 40.7 % (ref 37.5–51.0)
Hemoglobin: 13.4 g/dL (ref 13.0–17.7)
MCH: 30.9 pg (ref 26.6–33.0)
MCHC: 32.9 g/dL (ref 31.5–35.7)
MCV: 94 fL (ref 79–97)
Platelets: 199 10*3/uL (ref 150–450)
RBC: 4.34 x10E6/uL (ref 4.14–5.80)
RDW: 13.8 % (ref 11.6–15.4)
WBC: 6.1 10*3/uL (ref 3.4–10.8)

## 2020-03-20 LAB — LIPID PANEL
Chol/HDL Ratio: 2.9 ratio (ref 0.0–5.0)
Cholesterol, Total: 97 mg/dL — ABNORMAL LOW (ref 100–199)
HDL: 34 mg/dL — ABNORMAL LOW (ref >39)
LDL Chol Calc (NIH): 48 mg/dL (ref 0–99)
Triglycerides: 70 mg/dL (ref 0–149)
VLDL Cholesterol Cal: 15 mg/dL (ref 5–40)

## 2020-03-23 ENCOUNTER — Encounter: Payer: Self-pay | Admitting: *Deleted

## 2020-03-26 NOTE — Progress Notes (Signed)
Cardiology Office Note Date:  03/31/2020  Patient ID:  Benjamin Cook, Benjamin Cook [birth year], MRN [id number] PCP:  Patient, No Pcp Per  Cardiologist:  Dr. [name] Electrophysiologist: Dr. Rayann Heman    Chief Complaint:  2 mo EP f/u  History of Present Illness: Benjamin Cook is a 75 y.o. male with history of anxiety, CAD (s/p PCI to LAD 2001), ICM, chronic CHF (systolic), ICD, HTN, HLD, VT, AFib, AAA (s/p endovascular repair, Dr. Donzetta Matters), COPD  He comes today to be seen for Dr. Rayann Heman, last seen by him April 2020 via tele health visit , was doing well, no changes were made.  He has been followed by EP w/A. Tillery, PA,  most recently 01/07/20 was doing well, no further VT. Aldactone was added and planned for labs.  He saw Dr. [name] 03/17/20, doing well, labs updated, counseled on smoking.  TODAY He is accompanied by his wife He reports doing well. No CP, palpitations or cardiac awareness. No SOB at rest, no symptoms of PND or orthopnea. He has some baseline DOE that he reports unchanged for years. Still smoking "those small cigars", wife says he does not use his inhaler hardly ever, and never BID as it is prescribed. NO dizzy spells, near syncope or sycnope  No bleeding or signs of bleeding   Device information SJM dual chamber ICD,  Implanted 2012, gen change 03/18/2014 Has an abandoned RV lead (fracture), new lead placed 2018  Known A lead noise  04/21/2019 @ 9:31PM VT episode This was a true MMVT, 203bpm, one ATP delivered failed, , one HV therapy, 25J with clean break ATP therapies were added 10/30/19: VT w/HV tx (pt preferred to hold off AAD), stress with only peri-infarct ischemia, not felt to need cath  AAD Given amiodarone in ER March 2021, None to date otherwise   Past Medical History:  Diagnosis Date  . Adrenal mass ([place])    per pt this is remote (10 years) and benign by biopsy  . AICD (automatic cardioverter/defibrillator) present   . Anxiety   . Arthritis   .  CAD (coronary artery disease)    a. s/p PCI to LAD 2001 b. myoview 2014 high risk with scar LAD/RCA territory but no ischemia  . Cardiomyopathy, ischemic   . CHF (congestive heart failure) ([name])    class II/III  . COPD (chronic obstructive pulmonary disease) ([name])    smokes cigars but has quit cigarettes  . Defibrillator discharge 04/21/2019  . Dyspnea   . Flu 03/2015  . HTN (hypertension)   . Hyperlipidemia   . NSVT (nonsustained ventricular tachycardia) ([place]) 03/05/2017  . Paroxysmal atrial fibrillation ([name]) 03/2015   chads2vasc score is 4  . PSA (psoriatic arthritis) ([place])    increased  . PVD (peripheral vascular disease) ([place])     Past Surgical History:  Procedure Laterality Date  . ABDOMINAL AORTIC ENDOVASCULAR STENT GRAFT N/A 03/03/2017   Procedure: ABDOMINAL AORTIC ENDOVASCULAR STENT GRAFT;  Surgeon: Waynetta Sandy, MD;  Location: [facility];  Service: Vascular;  Laterality: N/A;  . cataract surgery    . CORONARY ANGIOPLASTY WITH STENT PLACEMENT  2001   a. PCI to LAD  . IMPLANTABLE CARDIOVERTER DEFIBRILLATOR (ICD) GENERATOR CHANGE N/A 03/18/2014   a. SJM Fortify ST DR ICD implanted by [facility] for primary prevention b. gen change 03/2014   . LEAD REVISION/REPAIR N/A 07/17/2016   Procedure: Lead Revision/Repair;  Surgeon: Evans Lance, MD;  Location: [facility];  Service: Cardiovascular;  Laterality: N/A;  .  LEFT HEART CATHETERIZATION WITH CORONARY ANGIOGRAM N/A 05/27/2014   Procedure: LEFT HEART CATHETERIZATION WITH CORONARY ANGIOGRAM;  Surgeon: Sherren Mocha, MD;  Location: [facility] CATH LAB;  Service: Cardiovascular;  Laterality: N/A;    Current Outpatient Medications  Medication Sig Dispense Refill  . acetaminophen (TYLENOL) 500 MG tablet Take 500-1,000 mg by mouth every 8 (eight) hours as needed (for pain).     Marland Kitchen atorvastatin (LIPITOR) 80 MG tablet TAKE 1 TABLET BY MOUTH  DAILY AT 6 PM 90 tablet 0  . carvedilol (COREG) 25 MG tablet TAKE 1 TABLET BY MOUTH  TWICE DAILY  WITH MEALS 180 tablet 3  . Fluticasone-Salmeterol (ADVAIR) 100-50 MCG/DOSE AEPB Inhale 1 puff into the lungs 2 (two) times daily. 1 each 0  . furosemide (LASIX) 40 MG tablet TAKE ONE-HALF TABLET BY  MOUTH DAILY 45 tablet 3  . hydrALAZINE (APRESOLINE) 25 MG tablet Take 1 tablet (25 mg total) by mouth 3 (three) times daily. 270 tablet 3  . isosorbide mononitrate (IMDUR) 30 MG 24 hr tablet Take 0.5 tablets (15 mg total) by mouth daily. 45 tablet 3  . Multiple Vitamin (MULTIVITAMIN) capsule Take 1 capsule by mouth daily.    . Omega-3 Fatty Acids (FISH OIL) 1200 MG CAPS Take 1,200 mg by mouth daily.    . potassium chloride SA (KLOR-CON) 20 MEQ tablet TAKE 1 TABLET BY MOUTH  DAILY 90 tablet 3  . spironolactone (ALDACTONE) 25 MG tablet Take 1 tablet (25 mg total) by mouth at bedtime. 90 tablet 3  . rivaroxaban (XARELTO) 20 MG TABS tablet Take 1 tablet (20 mg total) by mouth daily with supper. 90 tablet 3  . sacubitril-valsartan (ENTRESTO) 49-51 MG Take 1 tablet by mouth 2 (two) times daily. 60 tablet 12   No current facility-administered medications for this visit.    Allergies:   Patient has no known allergies.   Social History:  The patient  reports that he has been smoking cigars. He has never used smokeless tobacco. He reports that he does not drink alcohol and does not use drugs.   Family History:  The patient's family history includes Cirrhosis in his mother.  ROS:  Please see the history of present illness.    All other systems are reviewed and otherwise negative.   PHYSICAL EXAM:  VS:  BP (!) 124/52   Pulse 70   Ht 5\' 10"  (1.778 m)   Wt 143 lb (64.9 kg)   SpO2 99%   BMI 20.52 kg/m  BMI: Body mass index is 20.52 kg/m. Well nourished, well developed, in no acute distress HEENT: normocephalic, atraumatic Neck: no JVD, carotid bruits or masses Cardiac:   RRR; no significant murmurs, no rubs, or gallops Lungs:  CTA b/l, with slight end exp wheezing b/l, movinng air well,  no rhonchi  or rales Abd: soft, nontender MS: no deformity, age appropriateatrophy Ext: no edema Skin: warm and dry, no rash Neuro:  No gross deficits appreciated Psych: euthymic mood, full affect  ICD site is stable, no tethering or discomfort   EKG:  Not done today  Device interrogation done today and reviewed by myself:  Battery and lead measurements are good AMS noted, all EGMs reviewed One 8 second AT, all others false for A lead noise 2 NSVT, both 4 seconds (Dec 30, Feb 11) AP 18% VP <1%   11/23/2019: stress myoview  Nuclear stress EF: 22%.  No T wave inversion was noted during stress.  There was no ST segment deviation noted during stress.  Defect 1: There is a large defect of severe severity present in the basal inferior, mid anteroseptal, mid inferoseptal, mid inferior, apical septal, apical inferior, apical lateral and apex location.  Findings consistent with prior myocardial infarction with peri-infarct ischemia.  This is a high risk study.  The left ventricular ejection fraction is severely decreased (<30%).   Large size, severe severity mostly fixed (SDS 3) apical to mid inferior, apical, apical septal and anteroapical perfusion defect suggestive of mid to distal LAD and RCA territory scar with minimal peri-infarct ischemia. LVEF 22% with akinesis of the previously mentioned walls. This is a high risk study. Compared to a prior study on 12/21/2012, there is no significant change.    04/09/2019; TTE IMPRESSIONS  1. Left ventricular ejection fraction, by estimation, is 35%%. The left  ventricle has moderate to severely decreased function. There is mild left  ventricular hypertrophy. Left ventricular diastolic parameters are  consistent with Grade I diastolic  dysfunction (impaired relaxation).  2. Right ventricular systolic function is normal. The right ventricular  size is normal.  3. The mitral valve is normal in structure and function. Trivial mitral  valve  regurgitation.  4. The aortic valve is abnormal. Aortic valve regurgitation is trivial.  Mild aortic valve sclerosis is present, with no evidence of aortic valve  stenosis.  5. The inferior vena cava is normal in size with greater than 50%  respiratory variability, suggesting right atrial pressure of 3 mmHg.     Recent Labs: 05/07/2019: TSH 0.757 11/17/2019: Magnesium 1.9 03/17/2020: ALT 14; BUN 5; Creatinine, Ser 0.90; Hemoglobin 13.4; Platelets 199; Potassium 3.7; Sodium 139  03/17/2020: Chol/HDL Ratio 2.9; Cholesterol, Total 97; HDL 34; LDL Chol Calc (NIH) 48; Triglycerides 70   Estimated Creatinine Clearance: 64.1 mL/min (by C-G formula based on SCr of 0.9 mg/dL).   Wt Readings from Last 3 Encounters:  03/31/20 143 lb (64.9 kg)  03/17/20 146 lb (66.2 kg)  01/07/20 147 lb (66.7 kg)     Other studies reviewed: Additional studies/records reviewed today include: summarized above  ASSESSMENT AND PLAN:  1. ICD     Intact fyunction, no programming changes made  2. VT     Only 2 NSVT's  3. ICM 4. Chronic CHF (systolic)      CorVue is at baseline/stable      Weight is down a couple pounds      On good meds     No symptoms or exam findings to suggest volume OL     C/w Dr. [name]  5. Paroxysmal Afib     CHA2DS2Vasc is 5, onXarelto, appropriately dosed     0%  6. CAD     No anginal symptoms     On good meds, no ASA with xarelto     C/w Dr. [name]    Disposition: F/u with remotes as usual, in clinic with EP in a year, sooner if needed.  Planned to see Dr. [name] in 48mo  Current medicines are reviewed at length with the patient today.  The patient did not have any concerns regarding medicines.  Venetia Night, PA-C 03/31/2020 10:03 AM     [facility] [place] [address] [phone] (office)  [phone] (fax)

## 2020-03-31 ENCOUNTER — Other Ambulatory Visit: Payer: Self-pay

## 2020-03-31 ENCOUNTER — Encounter: Payer: Self-pay | Admitting: Physician Assistant

## 2020-03-31 ENCOUNTER — Ambulatory Visit: Payer: Medicare (Managed Care) | Admitting: Physician Assistant

## 2020-03-31 VITALS — BP 124/52 | HR 70 | Ht 70.0 in | Wt 143.0 lb

## 2020-03-31 DIAGNOSIS — I2510 Atherosclerotic heart disease of native coronary artery without angina pectoris: Secondary | ICD-10-CM

## 2020-03-31 DIAGNOSIS — I5022 Chronic systolic (congestive) heart failure: Secondary | ICD-10-CM

## 2020-03-31 DIAGNOSIS — I480 Paroxysmal atrial fibrillation: Secondary | ICD-10-CM

## 2020-03-31 DIAGNOSIS — I255 Ischemic cardiomyopathy: Secondary | ICD-10-CM | POA: Diagnosis not present

## 2020-03-31 DIAGNOSIS — I472 Ventricular tachycardia, unspecified: Secondary | ICD-10-CM

## 2020-03-31 DIAGNOSIS — Z95810 Presence of automatic (implantable) cardiac defibrillator: Secondary | ICD-10-CM

## 2020-03-31 MED ORDER — RIVAROXABAN 20 MG PO TABS: 20.0000 mg | ORAL_TABLET | Freq: Every day | ORAL | 3 refills | Status: DC

## 2020-03-31 MED ORDER — ENTRESTO 49-51 MG PO TABS: 1.0000 | ORAL_TABLET | Freq: Two times a day (BID) | ORAL | 12 refills | Status: DC

## 2020-03-31 NOTE — Patient Instructions (Signed)
Medication Instructions:   Your physician recommends that you continue on your current medications as directed. Please refer to the Current Medication list given to you today.  *If you need a refill on your cardiac medications before your next appointment, please call your pharmacy*   [facility] [place]   If you have labs (blood work) drawn today and your tests are completely normal, you will receive your results only by: Marland Kitchen MyChart Message (if you have MyChart) OR . A paper copy in the mail If you have any lab test that is abnormal or we need to change your treatment, we will call you to review the results.   Testing/Procedures: NONE ORDERED  TODAY    Follow-Up: At [facility], you and your health needs are our priority.  As part of our continuing mission to provide you with exceptional heart care, we have created designated Provider Care Teams.  These Care Teams include your primary Cardiologist (physician) and Advanced Practice Providers (APPs -  Physician Assistants and Nurse Practitioners) who all work together to provide you with the care you need, when you need it.  We recommend signing up for the patient portal called "MyChart".  Sign up information is provided on this After Visit Summary.  MyChart is used to connect with patients for Virtual Visits (Telemedicine).  Patients are able to view lab/test results, encounter notes, upcoming appointments, etc.  Non-urgent messages can be sent to your provider as well.   To learn more about what you can do with MyChart, go to [email or web address].    Your next appointment:   1 year(s)  The format for your next appointment:   In Person  Provider:   You may see Thompson Grayer, MD or one of the following Advanced Practice Providers on your designated Care Team:    Chanetta Marshall, NP  Tommye Standard, PA-C  Legrand Como "Oda Kilts, [place]    Other Instructions

## 2020-04-19 ENCOUNTER — Other Ambulatory Visit: Payer: Self-pay

## 2020-04-19 MED ORDER — FUROSEMIDE 40 MG PO TABS: 20.0000 mg | ORAL_TABLET | Freq: Every day | ORAL | 3 refills | Status: DC

## 2020-04-19 MED ORDER — CARVEDILOL 25 MG PO TABS: 25.0000 mg | ORAL_TABLET | Freq: Two times a day (BID) | ORAL | 3 refills | Status: DC

## 2020-04-19 MED ORDER — ATORVASTATIN CALCIUM 80 MG PO TABS: ORAL_TABLET | ORAL | 3 refills | Status: DC

## 2020-04-19 NOTE — Telephone Encounter (Signed)
Pt's medication was sent to pt's pharmacy as requested. Confirmation received.  °

## 2020-04-19 NOTE — Telephone Encounter (Signed)
Pt's medications were sent to pt's pharmacy as requested. Confirmation received.  

## 2020-04-26 ENCOUNTER — Ambulatory Visit (INDEPENDENT_AMBULATORY_CARE_PROVIDER_SITE_OTHER): Payer: Medicare (Managed Care)

## 2020-04-26 DIAGNOSIS — I5022 Chronic systolic (congestive) heart failure: Secondary | ICD-10-CM

## 2020-04-26 DIAGNOSIS — I255 Ischemic cardiomyopathy: Secondary | ICD-10-CM

## 2020-04-27 ENCOUNTER — Ambulatory Visit (INDEPENDENT_AMBULATORY_CARE_PROVIDER_SITE_OTHER): Payer: Medicare (Managed Care)

## 2020-04-27 DIAGNOSIS — I5022 Chronic systolic (congestive) heart failure: Secondary | ICD-10-CM

## 2020-04-27 DIAGNOSIS — Z95810 Presence of automatic (implantable) cardiac defibrillator: Secondary | ICD-10-CM

## 2020-04-27 LAB — CUP PACEART REMOTE DEVICE CHECK
Battery Remaining Longevity: 36 mo
Battery Remaining Percentage: 36 %
Battery Voltage: 2.95 V
Brady Statistic AP VP Percent: 2 %
Brady Statistic AP VS Percent: 30 %
Brady Statistic AS VP Percent: 1 %
Brady Statistic AS VS Percent: 58 %
Brady Statistic RA Percent Paced: 18 %
Brady Statistic RV Percent Paced: 2.6 %
Date Time Interrogation Session: 20220324113053
HighPow Impedance: 71 Ohm
HighPow Impedance: 71 Ohm
Implantable Lead Implant Date: 20120420
Implantable Lead Implant Date: 20180613
Implantable Lead Location: 753859
Implantable Lead Location: 753860
Implantable Pulse Generator Implant Date: 20160212
Lead Channel Impedance Value: 380 Ohm
Lead Channel Impedance Value: 640 Ohm
Lead Channel Pacing Threshold Amplitude: 0.5 V
Lead Channel Pacing Threshold Amplitude: 0.5 V
Lead Channel Pacing Threshold Pulse Width: 0.5 ms
Lead Channel Pacing Threshold Pulse Width: 0.5 ms
Lead Channel Sensing Intrinsic Amplitude: 11.6 mV
Lead Channel Sensing Intrinsic Amplitude: 2.7 mV
Lead Channel Setting Pacing Amplitude: 2 V
Lead Channel Setting Pacing Amplitude: 2.5 V
Lead Channel Setting Pacing Pulse Width: 0.5 ms
Lead Channel Setting Sensing Sensitivity: 0.5 mV
Pulse Gen Serial Number: 7225079

## 2020-04-28 NOTE — Progress Notes (Signed)
EPIC Encounter for ICM Monitoring  Patient Name: Benjamin Cook is a 75 y.o. male Date: 04/28/2020 Primary Care Physican: Patient, No Pcp Per Primary Cardiologist:Crenshaw Electrophysiologist: Allred 03/31/2020 Officeweight:143lbs   Transmission Reviewed  CorVue thoracic impedancesuggestingnormal fluid levels.  Prescribed:  Furosemide 40 mg0.5tablet (20 mg total)daily.   Potassium 20 mEqtake1 tablet daily.  Spironolactone 25 mg take 1 tablet daily  Labs: 03/17/2020 Creatinine 0.90, BUN 5, Potassium 3.7, Sodium 139, GFR 83-96 01/17/2020 Creatinine 0.79 BUN 5,  Potassium 4.1, Sodium 143, GFR 87-101 01/07/2020 Creatinine 0.89, BUN 9, Potassium 4.0, Sodium 139, GFR 83-96 11/17/2019 Creatinine 0.93, BUN 7, Potassium 4.0, Sodium 138, GFR 79-92 05/07/2019 Creatinine 0.94, BUN 6, Potassium 4.2, Sodium 140, GFR 79-91 04/21/2019 Creatinine 0.97, BUN 8, Potassium 4.2, Sodium 139, GFR >60 02/05/2021Creatinine 0.94, BUN7, Potassium4.1, Sodium 139, [id number]  Recommendations: No changes  Follow-up plan: ICM clinic phone appointment on4/25/2022. 91 day device clinic remote transmission6/22/2022.   EP/Cardiology Office Visits: Recall 8/10/2022with Dr. [name].  Recall 03/31/2021 with Dr Rayann Heman or PA.  Copy of ICM check sent to Dr.Allred.  3 month ICM trend: 04/27/2020.    1 Year ICM trend:       [name], RN 04/28/2020 1:16 PM

## 2020-05-05 NOTE — Progress Notes (Signed)
Remote ICD transmission.   

## 2020-05-29 ENCOUNTER — Ambulatory Visit (INDEPENDENT_AMBULATORY_CARE_PROVIDER_SITE_OTHER): Payer: Medicare (Managed Care)

## 2020-05-29 DIAGNOSIS — I5022 Chronic systolic (congestive) heart failure: Secondary | ICD-10-CM

## 2020-05-29 DIAGNOSIS — Z95810 Presence of automatic (implantable) cardiac defibrillator: Secondary | ICD-10-CM | POA: Diagnosis not present

## 2020-05-31 NOTE — Progress Notes (Signed)
EPIC Encounter for ICM Monitoring  Patient Name: Benjamin Cook is a 75 y.o. male Date: 05/31/2020 Primary Care Physican: Patient, No Pcp Per (Inactive) Primary Cardiologist:Crenshaw Electrophysiologist: Allred 05/31/2020 Weight:143lbs   Spoke with wife and reports feeling well at this time.  Denies fluid symptoms.    CorVue thoracic impedancesuggestingnormal fluid levels.    Prescribed:  Furosemide 40 mg0.5tablet (20 mg total)daily.   Potassium 20 mEqtake1 tablet daily.  Spironolactone 25 mg take 1 tablet daily  Labs: 03/17/2020 Creatinine 0.90, BUN 5, Potassium 3.7, Sodium 139, GFR 83-96 01/17/2020 Creatinine 0.79 BUN 5, Potassium 4.1, Sodium 143, GFR 87-101 01/07/2020 Creatinine 0.89, BUN 9, Potassium 4.0, Sodium 139, GFR 83-96 11/17/2019 Creatinine 0.93, BUN 7, Potassium 4.0, Sodium 138, GFR 79-92 05/07/2019 Creatinine 0.94, BUN 6, Potassium 4.2, Sodium 140, GFR 79-91 04/21/2019 Creatinine 0.97, BUN 8, Potassium 4.2, Sodium 139, GFR >60 02/05/2021Creatinine 0.94, BUN7, Potassium4.1, Sodium 139, [id number]  Recommendations: No changes and encouraged to call if experiencing any fluid symptoms.  Follow-up plan: ICM clinic phone appointment on6/07/2020. 91 day device clinic remote transmission6/22/2022.   EP/Cardiology Office Visits: Recall 8/10/2022with Dr. [name].  Recall 03/31/2021 with Dr Rayann Heman or PA.  Copy of ICM check sent to Dr.Allred.   3 month ICM trend: 05/29/2020.    1 Year ICM trend:       [name], RN 05/31/2020 8:55 AM

## 2020-06-01 ENCOUNTER — Telehealth: Payer: Self-pay

## 2020-06-01 ENCOUNTER — Other Ambulatory Visit: Payer: Self-pay

## 2020-06-01 ENCOUNTER — Encounter (HOSPITAL_COMMUNITY): Payer: Self-pay | Admitting: Cardiology

## 2020-06-01 ENCOUNTER — Emergency Department (HOSPITAL_COMMUNITY): Payer: Medicare (Managed Care)

## 2020-06-01 ENCOUNTER — Inpatient Hospital Stay (HOSPITAL_COMMUNITY)
Admission: EM | Admit: 2020-06-01 | Discharge: 2020-06-04 | DRG: 309 | Disposition: A | Discharge status: Home / Self Care | Payer: Medicare (Managed Care) | Attending: Cardiology | Admitting: Cardiology

## 2020-06-01 DIAGNOSIS — Z20822 Contact with and (suspected) exposure to covid-19: Secondary | ICD-10-CM | POA: Diagnosis present

## 2020-06-01 DIAGNOSIS — Z79899 Other long term (current) drug therapy: Secondary | ICD-10-CM | POA: Diagnosis not present

## 2020-06-01 DIAGNOSIS — E876 Hypokalemia: Secondary | ICD-10-CM | POA: Diagnosis present

## 2020-06-01 DIAGNOSIS — L4050 Arthropathic psoriasis, unspecified: Secondary | ICD-10-CM | POA: Diagnosis present

## 2020-06-01 DIAGNOSIS — Z7951 Long term (current) use of inhaled steroids: Secondary | ICD-10-CM | POA: Diagnosis not present

## 2020-06-01 DIAGNOSIS — Z7901 Long term (current) use of anticoagulants: Secondary | ICD-10-CM | POA: Diagnosis not present

## 2020-06-01 DIAGNOSIS — I2510 Atherosclerotic heart disease of native coronary artery without angina pectoris: Secondary | ICD-10-CM | POA: Diagnosis present

## 2020-06-01 DIAGNOSIS — J449 Chronic obstructive pulmonary disease, unspecified: Secondary | ICD-10-CM | POA: Diagnosis present

## 2020-06-01 DIAGNOSIS — Z95810 Presence of automatic (implantable) cardiac defibrillator: Secondary | ICD-10-CM

## 2020-06-01 DIAGNOSIS — Z681 Body mass index (BMI) 19 or less, adult: Secondary | ICD-10-CM

## 2020-06-01 DIAGNOSIS — Z955 Presence of coronary angioplasty implant and graft: Secondary | ICD-10-CM

## 2020-06-01 DIAGNOSIS — I5022 Chronic systolic (congestive) heart failure: Secondary | ICD-10-CM | POA: Diagnosis present

## 2020-06-01 DIAGNOSIS — F419 Anxiety disorder, unspecified: Secondary | ICD-10-CM | POA: Diagnosis present

## 2020-06-01 DIAGNOSIS — F17290 Nicotine dependence, other tobacco product, uncomplicated: Secondary | ICD-10-CM | POA: Diagnosis present

## 2020-06-01 DIAGNOSIS — E279 Disorder of adrenal gland, unspecified: Secondary | ICD-10-CM | POA: Diagnosis present

## 2020-06-01 DIAGNOSIS — I472 Ventricular tachycardia, unspecified: Secondary | ICD-10-CM

## 2020-06-01 DIAGNOSIS — I480 Paroxysmal atrial fibrillation: Secondary | ICD-10-CM | POA: Diagnosis present

## 2020-06-01 DIAGNOSIS — M1990 Unspecified osteoarthritis, unspecified site: Secondary | ICD-10-CM | POA: Diagnosis present

## 2020-06-01 DIAGNOSIS — R778 Other specified abnormalities of plasma proteins: Secondary | ICD-10-CM

## 2020-06-01 DIAGNOSIS — I255 Ischemic cardiomyopathy: Secondary | ICD-10-CM | POA: Diagnosis present

## 2020-06-01 DIAGNOSIS — R7989 Other specified abnormal findings of blood chemistry: Secondary | ICD-10-CM

## 2020-06-01 DIAGNOSIS — E440 Moderate protein-calorie malnutrition: Secondary | ICD-10-CM | POA: Insufficient documentation

## 2020-06-01 DIAGNOSIS — I5020 Unspecified systolic (congestive) heart failure: Secondary | ICD-10-CM | POA: Diagnosis present

## 2020-06-01 DIAGNOSIS — I110 Hypertensive heart disease with heart failure: Secondary | ICD-10-CM | POA: Diagnosis present

## 2020-06-01 DIAGNOSIS — Z4502 Encounter for adjustment and management of automatic implantable cardiac defibrillator: Secondary | ICD-10-CM

## 2020-06-01 DIAGNOSIS — I739 Peripheral vascular disease, unspecified: Secondary | ICD-10-CM | POA: Diagnosis present

## 2020-06-01 DIAGNOSIS — I5023 Acute on chronic systolic (congestive) heart failure: Secondary | ICD-10-CM | POA: Diagnosis present

## 2020-06-01 DIAGNOSIS — E785 Hyperlipidemia, unspecified: Secondary | ICD-10-CM | POA: Diagnosis present

## 2020-06-01 HISTORY — DX: Diverticulitis of intestine, part unspecified, without perforation or abscess without bleeding: K57.92

## 2020-06-01 LAB — CBC WITH DIFFERENTIAL/PLATELET
Abs Immature Granulocytes: 0.07 10*3/uL (ref 0.00–0.07)
Basophils Absolute: 0 10*3/uL (ref 0.0–0.1)
Basophils Relative: 0 %
Eosinophils Absolute: 0.2 10*3/uL (ref 0.0–0.5)
Eosinophils Relative: 1 %
HCT: 44.1 % (ref 39.0–52.0)
Hemoglobin: 14.8 g/dL (ref 13.0–17.0)
Immature Granulocytes: 1 %
Lymphocytes Relative: 7 %
Lymphs Abs: 1 10*3/uL (ref 0.7–4.0)
MCH: 31.6 pg (ref 26.0–34.0)
MCHC: 33.6 g/dL (ref 30.0–36.0)
MCV: 94 fL (ref 80.0–100.0)
Monocytes Absolute: 1.3 10*3/uL — ABNORMAL HIGH (ref 0.1–1.0)
Monocytes Relative: 10 %
Neutro Abs: 11.1 10*3/uL — ABNORMAL HIGH (ref 1.7–7.7)
Neutrophils Relative %: 81 %
Platelets: 174 10*3/uL (ref 150–400)
RBC: 4.69 MIL/uL (ref 4.22–5.81)
RDW: 14.4 % (ref 11.5–15.5)
WBC: 13.6 10*3/uL — ABNORMAL HIGH (ref 4.0–10.5)
nRBC: 0 % (ref 0.0–0.2)

## 2020-06-01 LAB — COMPREHENSIVE METABOLIC PANEL
ALT: 20 U/L (ref 0–44)
AST: 34 U/L (ref 15–41)
Albumin: 3.5 g/dL (ref 3.5–5.0)
Alkaline Phosphatase: 67 U/L (ref 38–126)
Anion gap: 13 (ref 5–15)
BUN: 6 mg/dL — ABNORMAL LOW (ref 8–23)
CO2: 23 mmol/L (ref 22–32)
Calcium: 8.7 mg/dL — ABNORMAL LOW (ref 8.9–10.3)
Chloride: 99 mmol/L (ref 98–111)
Creatinine, Ser: 1.01 mg/dL (ref 0.61–1.24)
GFR, Estimated: >60 mL/min (ref >60)
Glucose, Bld: 151 mg/dL — ABNORMAL HIGH (ref 70–99)
Potassium: 3.2 mmol/L — ABNORMAL LOW (ref 3.5–5.1)
Sodium: 135 mmol/L (ref 135–145)
Total Bilirubin: 1.3 mg/dL — ABNORMAL HIGH (ref 0.3–1.2)
Total Protein: 7.1 g/dL (ref 6.5–8.1)

## 2020-06-01 LAB — MRSA PCR SCREENING: MRSA by PCR: NEGATIVE

## 2020-06-01 LAB — TROPONIN I (HIGH SENSITIVITY)
Troponin I (High Sensitivity): 111 ng/L (ref <18)
Troponin I (High Sensitivity): 421 ng/L (ref <18)

## 2020-06-01 LAB — LACTIC ACID, PLASMA
Lactic Acid, Venous: 2.8 mmol/L (ref 0.5–1.9)
Lactic Acid, Venous: 1.3 mmol/L (ref 0.5–1.9)

## 2020-06-01 LAB — RESP PANEL BY RT-PCR (FLU A&B, COVID) ARPGX2
Influenza A by PCR: NEGATIVE
Influenza B by PCR: NEGATIVE
SARS Coronavirus 2 by RT PCR: NEGATIVE

## 2020-06-01 LAB — MAGNESIUM: Magnesium: 1.8 mg/dL (ref 1.7–2.4)

## 2020-06-01 LAB — BRAIN NATRIURETIC PEPTIDE: B Natriuretic Peptide: 127.2 pg/mL — ABNORMAL HIGH (ref 0.0–100.0)

## 2020-06-01 MED ORDER — AMIODARONE LOAD VIA INFUSION
150.0000 mg | Freq: Once | INTRAVENOUS | Status: AC
  Administered 2020-06-01: 150 mg via INTRAVENOUS
  Filled 2020-06-01: qty 83.34

## 2020-06-01 MED ORDER — ATORVASTATIN CALCIUM 80 MG PO TABS
80.0000 mg | ORAL_TABLET | Freq: Every day | ORAL | Status: DC
  Administered 2020-06-01 – 2020-06-04 (×4): 80 mg via ORAL
  Filled 2020-06-01 (×4): qty 1

## 2020-06-01 MED ORDER — AMIODARONE HCL IN DEXTROSE 360-4.14 MG/200ML-% IV SOLN
30.0000 mg/h | INTRAVENOUS | Status: DC
  Administered 2020-06-02 – 2020-06-03 (×3): 30 mg/h via INTRAVENOUS
  Filled 2020-06-01 (×2): qty 200

## 2020-06-01 MED ORDER — HEPARIN (PORCINE) 25000 UT/250ML-% IV SOLN
1000.0000 [IU]/h | INTRAVENOUS | Status: DC
  Administered 2020-06-01: 1000 [IU]/h via INTRAVENOUS
  Filled 2020-06-01: qty 250

## 2020-06-01 MED ORDER — POTASSIUM CHLORIDE CRYS ER 20 MEQ PO TBCR
30.0000 meq | EXTENDED_RELEASE_TABLET | ORAL | Status: AC
  Administered 2020-06-01 – 2020-06-02 (×3): 30 meq via ORAL
  Filled 2020-06-01 (×3): qty 1

## 2020-06-01 MED ORDER — AMIODARONE LOAD VIA INFUSION
150.0000 mg | Freq: Once | INTRAVENOUS | Status: AC
  Administered 2020-06-01: 150 mg via INTRAVENOUS

## 2020-06-01 MED ORDER — CARVEDILOL 25 MG PO TABS
25.0000 mg | ORAL_TABLET | Freq: Two times a day (BID) | ORAL | Status: DC
  Administered 2020-06-02 – 2020-06-04 (×5): 25 mg via ORAL
  Filled 2020-06-01 (×5): qty 1

## 2020-06-01 MED ORDER — AMIODARONE HCL IN DEXTROSE 360-4.14 MG/200ML-% IV SOLN
60.0000 mg/h | INTRAVENOUS | Status: AC
  Administered 2020-06-01 (×2): 60 mg/h via INTRAVENOUS
  Filled 2020-06-01 (×2): qty 200

## 2020-06-01 MED ORDER — ENSURE ENLIVE PO LIQD
237.0000 mL | Freq: Two times a day (BID) | ORAL | Status: DC
  Administered 2020-06-02 (×2): 237 mL via ORAL

## 2020-06-01 MED ORDER — ACETAMINOPHEN 325 MG PO TABS: 650.0000 mg | ORAL_TABLET | ORAL | Status: DC | PRN

## 2020-06-01 MED ORDER — ISOSORBIDE MONONITRATE ER 30 MG PO TB24
15.0000 mg | ORAL_TABLET | Freq: Every day | ORAL | Status: DC
  Administered 2020-06-01 – 2020-06-04 (×4): 15 mg via ORAL
  Filled 2020-06-01 (×4): qty 1

## 2020-06-01 MED ORDER — NITROGLYCERIN 0.4 MG SL SUBL: 0.4000 mg | SUBLINGUAL_TABLET | SUBLINGUAL | Status: DC | PRN

## 2020-06-01 NOTE — ED Triage Notes (Signed)
Pt BIB [facility] b/c his pacer/defibillater was firing and he was showing runs of sustained vtach with EMS.   Pt was awoken via shock by the defibrillator last night and then was shocked this afternoon repeatedly after working in the yard and while sitting down to eat.   EMS gave 2 150 mg doses of amiodarone in route, approx. 450 mL of NS and externally shocked him twice.  Pt was shocked twice by his defibrillator on arrival.

## 2020-06-01 NOTE — H&P (Addendum)
Cardiology Admission History and Physical:   Patient ID: Benjamin Cook MRN: [id number]; DOB: 1945-05-07   Admission date: 06/01/2020  PCP:  Patient, No Pcp Per (Inactive)   [place]  Cardiologist:  Kirk Ruths, MD  Electrophysiologist:  Thompson Grayer, MD   Chief Complaint:  ICD shocks  Patient Profile:   Benjamin Cook is a 75 y.o. male with history of anxiety, CAD (s/p PCI to LAD 2001), ICM, chronic CHF (systolic), ICD, HTN, HLD, VT, AFib, AAA (s/p endovascular repair, Dr. Donzetta Matters), COPD  Device information SJM dual chamber ICD, Implanted 2012, gen change 03/18/2014 Has an abandoned RV lead (fracture), new lead placed 2018  Known A lead noise  04/21/2019 @ 9:31PM VT episode This was a true MMVT, 203bpm, one ATP delivered failed, , one HV therapy, 25J with clean break ATP therapies were added 10/30/19: VT w/HV tx (pt preferred to hold off AAD), stress with only peri-infarct ischemia, not felt to need cath  AAD Given amiodarone in ER March 2021, None to date otherwise   History of Present Illness:   Benjamin Cook has been feeling well, denies any kind of CP< palpitations or cardiac awareness.  No unusual SOB. He states last night in bed he was shocked once by his device but felt well and slpet well waking this morning again feeling OK. He reports that he went about his usual activities came in to eat and just as he sat down he felt dizzy and was shocked.  Followed by more ICD therapies.  He denies any syncope, but his wife mentions that intermittently his eyes started to roll back in his head then he got shocked, EMS was called and they report that they needed to shock him possibly twice.  I do not have EMS record, in d/w the nurse, they gave him an amio bolus and in the ER with recurrent VT got two more 150mg  bolus' and started o a gtt.  He has not had further VT since  Labs are all pending  The patient denies any kind of symptoms  currently, no CP, not SOB  Interrogation Battery and lead measurements are stable In total 74 ATP  Therapies and 14 HV therapies All appropriate All were MMVT episodes, but did have two different VT's Largely ATP failed though a couple did work Also a couple ATpPs that slowed his VT to below detection and labeled return to sinus   Past Medical History:  Diagnosis Date  . Adrenal mass ([place])    per pt this is remote (10 years) and benign by biopsy  . AICD (automatic cardioverter/defibrillator) present   . Anxiety   . Arthritis   . CAD (coronary artery disease)    a. s/p PCI to LAD 2001 b. myoview 2014 high risk with scar LAD/RCA territory but no ischemia  . Cardiomyopathy, ischemic   . CHF (congestive heart failure) ([name])    class II/III  . COPD (chronic obstructive pulmonary disease) ([name])    smokes cigars but has quit cigarettes  . Defibrillator discharge 04/21/2019  . Dyspnea   . Flu 03/2015  . HTN (hypertension)   . Hyperlipidemia   . NSVT (nonsustained ventricular tachycardia) ([place]) 03/05/2017  . Paroxysmal atrial fibrillation ([name]) 03/2015   chads2vasc score is 4  . PSA (psoriatic arthritis) ([place])    increased  . PVD (peripheral vascular disease) ([place])     Past Surgical History:  Procedure Laterality Date  . ABDOMINAL AORTIC ENDOVASCULAR STENT GRAFT N/A 03/03/2017  Procedure: ABDOMINAL AORTIC ENDOVASCULAR STENT GRAFT;  Surgeon: Waynetta Sandy, MD;  Location: [place];  Service: Vascular;  Laterality: N/A;  . cataract surgery    . CORONARY ANGIOPLASTY WITH STENT PLACEMENT  2001   a. PCI to LAD  . IMPLANTABLE CARDIOVERTER DEFIBRILLATOR (ICD) GENERATOR CHANGE N/A 03/18/2014   a. SJM Fortify ST DR ICD implanted by [facility] for primary prevention b. gen change 03/2014   . LEAD REVISION/REPAIR N/A 07/17/2016   Procedure: Lead Revision/Repair;  Surgeon: Evans Lance, MD;  Location: [place] [facility];  Service: Cardiovascular;  Laterality: N/A;  . LEFT HEART  CATHETERIZATION WITH CORONARY ANGIOGRAM N/A 05/27/2014   Procedure: LEFT HEART CATHETERIZATION WITH CORONARY ANGIOGRAM;  Surgeon: Sherren Mocha, MD;  Location: [facility] CATH LAB;  Service: Cardiovascular;  Laterality: N/A;     Medications Prior to Admission: Prior to Admission medications   Medication Sig Start Date End Date Taking? Authorizing Provider  acetaminophen (TYLENOL) 500 MG tablet Take 500-1,000 mg by mouth every 8 (eight) hours as needed (for pain).     [provider]  atorvastatin (LIPITOR) 80 MG tablet TAKE 1 TABLET BY MOUTH  DAILY AT 6 PM 04/19/20   Lelon Perla, MD  carvedilol (COREG) 25 MG tablet Take 1 tablet (25 mg total) by mouth 2 (two) times daily with a meal. 04/19/20   Allred, Jeneen Rinks, MD  Fluticasone-Salmeterol (ADVAIR) 100-50 MCG/DOSE AEPB Inhale 1 puff into the lungs 2 (two) times daily. 01/07/20   Shirley Friar, PA-C  furosemide (LASIX) 40 MG tablet Take 0.5 tablets (20 mg total) by mouth daily. 04/19/20   Allred, Jeneen Rinks, MD  hydrALAZINE (APRESOLINE) 25 MG tablet Take 1 tablet (25 mg total) by mouth 3 (three) times daily. 11/17/19 02/15/20  Shirley Friar, PA-C  isosorbide mononitrate (IMDUR) 30 MG 24 hr tablet Take 0.5 tablets (15 mg total) by mouth daily. 11/17/19   Shirley Friar, PA-C  Multiple Vitamin (MULTIVITAMIN) capsule Take 1 capsule by mouth daily.    [provider]  Omega-3 Fatty Acids (FISH OIL) 1200 MG CAPS Take 1,200 mg by mouth daily.    [provider]  potassium chloride SA (KLOR-CON) 20 MEQ tablet TAKE 1 TABLET BY MOUTH  DAILY 02/24/20   Lelon Perla, MD  rivaroxaban (XARELTO) 20 MG TABS tablet Take 1 tablet (20 mg total) by mouth daily with supper. 03/31/20   Baldwin Jamaica, PA-C  sacubitril-valsartan (ENTRESTO) 49-51 MG Take 1 tablet by mouth 2 (two) times daily. 03/31/20   Baldwin Jamaica, PA-C  spironolactone (ALDACTONE) 25 MG tablet Take 1 tablet (25 mg total) by mouth at bedtime. 01/07/20    Shirley Friar, PA-C     Allergies:   No Known Allergies  Social History:   Social History   Socioeconomic History  . Marital status: Married    Spouse name: Not on file  . Number of children: Not on file  . Years of education: Not on file  . Highest education level: Not on file  Occupational History  . Occupation: Lawn care  Tobacco Use  . Smoking status: Current Every Day Smoker    Types: Cigars  . Smokeless tobacco: Never Used  . Tobacco comment: smokes cigars now, no longer smokes cigarettes but previously smoked 2ppd  Vaping Use  . Vaping Use: Never used  Substance and Sexual Activity  . Alcohol use: No  . Drug use: No  . Sexual activity: Not Currently    Partners: Female  Birth control/protection: None  Other Topics Concern  . Not on file  Social History Narrative  . Not on file   Social Determinants of Health   Financial Resource Strain: Not on file  Food Insecurity: Not on file  Transportation Needs: Not on file  Physical Activity: Not on file  Stress: Not on file  Social Connections: Not on file  Intimate Partner Violence: Not on file    Family History:   The patient's family history includes Cirrhosis in his mother. There is no history of Cancer.    ROS:  Please see the history of present illness.  All other ROS reviewed and negative.     Physical Exam/Data:   Vitals:   06/01/20 1630  BP: 121/61  Pulse: 74  Resp: (!) 29  SpO2: 98%   No intake or output data in the 24 hours ending 06/01/20 1748 Last 3 Weights 03/31/2020 03/17/2020 01/07/2020  Weight (lbs) 143 lb 146 lb 147 lb  Weight (kg) 64.864 kg 66.225 kg 66.679 kg     There is no height or weight on file to calculate BMI.  General:  Well nourished, well developed, in no acute distress HEENT: normal Lymph: no adenopathy Neck: no JVD Endocrine:  No thryomegaly Vascular: No carotid bruits Cardiac:  RRR; no murmur s, gallops or rubs Lungs:  CTA b/l, no wheezing, rhonchi or  rales  Abd: soft, nontender Ext: no edema Musculoskeletal:  No deformities Skin: warm and dry  Neuro:  no focal abnormalities noted, L eye droop since birth Psych:  Normal affect    EKG:  personally reviewed and demonstrates SR 77, RBBB, LAD #2 VT 186bpm  Relevant CV Studies:  11/23/2019: stress myoview  Nuclear stress EF: 22%.  No T wave inversion was noted during stress.  There was no ST segment deviation noted during stress.  Defect 1: There is a large defect of severe severity present in the basal inferior, mid anteroseptal, mid inferoseptal, mid inferior, apical septal, apical inferior, apical lateral and apex location.  Findings consistent with prior myocardial infarction with peri-infarct ischemia.  This is a high risk study.  The left ventricular ejection fraction is severely decreased (<30%).  Large size, severe severity mostly fixed (SDS 3) apical to mid inferior, apical, apical septal and anteroapical perfusion defect suggestive of mid to distal LAD and RCA territory scar with minimal peri-infarct ischemia. LVEF 22% with akinesis of the previously mentioned walls. This is a high risk study. Compared to a prior study on 12/21/2012, there is no significant change.    04/09/2019; TTE IMPRESSIONS  1. Left ventricular ejection fraction, by estimation, is 35%%. The left  ventricle has moderate to severely decreased function. There is mild left  ventricular hypertrophy. Left ventricular diastolic parameters are  consistent with Grade I diastolic  dysfunction (impaired relaxation).  2. Right ventricular systolic function is normal. The right ventricular  size is normal.  3. The mitral valve is normal in structure and function. Trivial mitral  valve regurgitation.  4. The aortic valve is abnormal. Aortic valve regurgitation is trivial.  Mild aortic valve sclerosis is present, with no evidence of aortic valve  stenosis.  5. The inferior vena cava is normal in  size with greater than 50%  respiratory variability, suggesting right atrial pressure of 3 mmHg.    Laboratory Data:  High Sensitivity Troponin:  No results for input(s): TROPONINIHS in the last 720 hours.    ChemistryNo results for input(s): NA, K, CL, CO2, GLUCOSE, BUN, CREATININE,  CALCIUM, GFRNONAA, GFRAA, ANIONGAP in the last 168 hours.  No results for input(s): PROT, ALBUMIN, AST, ALT, ALKPHOS, BILITOT in the last 168 hours. HematologyNo results for input(s): WBC, RBC, HGB, HCT, MCV, MCH, MCHC, RDW, PLT in the last 168 hours. BNPNo results for input(s): BNP, PROBNP in the last 168 hours.  DDimer No results for input(s): DDIMER in the last 168 hours.   Radiology/Studies:  DG Chest Portable 1 View  Result Date: 06/01/2020 CLINICAL DATA:  Shortness of breath EXAM: PORTABLE CHEST 1 VIEW COMPARISON:  Chest radiograph dated 04/21/2019. FINDINGS: Defibrillator pads overlie the chest. The heart size is normal. Vascular calcifications are seen in the aortic arch. A left subclavian approach cardiac device is redemonstrated. There is minimal left basilar atelectasis. The right lung is clear. There is no pleural effusion or pneumothorax. Degenerative changes are seen in the spine. IMPRESSION: Minimal left basilar atelectasis. Aortic Atherosclerosis (ICD10-I70.0). Electronically Signed   By: Zerita Boers M.D.   On: 06/01/2020 17:12     Assessment and Plan:   1. VT storm     S/p several amio bolus' > gtt running     Labs are all still pending  Dr. Curt Bears has seen and examined the patient, discussed plan for admission with the patient and family Continue amiodarone gtt Continue home BB, imdur, statin Wait on labs prior to starting his HF meds He is not overtly volume OL currently by exam, XRAY or CorVue BP looks OK  An additional VT zone was added at 166bpm  2. CAD      No anginal symptoms      Await labs, Trops pending  3. ICM 4. AFib  Admit to ICU   Risk Assessment/Risk  Scores:  { Severity of Illness: The appropriate patient status for this patient is INPATIENT. Inpatient status is judged to be reasonable and necessary in order to provide the required intensity of service to ensure the patient's safety. The patient's presenting symptoms, physical exam findings, and initial radiographic and laboratory data in the context of their chronic comorbidities is felt to place them at high risk for further clinical deterioration. Furthermore, it is not anticipated that the patient Benjamin Cook be medically stable for discharge from the hospital within 2 midnights of admission. The following factors support the patient status of inpatient.   " The patient's presenting symptoms include VT storm.   * I certify that at the point of admission it is my clinical judgment that the patient Benjamin Cook require inpatient hospital care spanning beyond 2 midnights from the point of admission due to high intensity of service, high risk for further deterioration and high frequency of surveillance required.*    For questions or updates, please contact [place] Please consult [email or web address] for contact info under     Signed, Baldwin Jamaica, PA-C  06/01/2020     I have seen and examined this patient with Benjamin Cook.  Agree with above, note added to reflect my findings.  On exam, RRR, no murmurs, lungs clear. Patient presented to the ER with VT. Had multiple episodes treated with both ATP and ICD shocks. Also had VT at a slower rate than his VT zone. Have added a second VT zone at 166. Benjamin Cook await results of troponin before deciding on LHC. For now Benjamin Cook continue amiodarone IV. No obvious volume overload with normal corview, no volume on exam and CXR without pulmonary edema.  Benjamin Cook M. Denaya Horn MD 06/01/2020 6:24 PM

## 2020-06-01 NOTE — Progress Notes (Signed)
ANTICOAGULATION CONSULT NOTE - Initial Consult  Pharmacy Consult for heparin Indication: atrial fibrillation  No Known Allergies  Patient Measurements:   Heparin Dosing Weight: 65 kg   Vital Signs: Temp: 97.7 F (36.5 C) (04/28 1758) Temp Source: Oral (04/28 1758) BP: 111/55 (04/28 1750) Pulse Rate: 66 (04/28 1750)  Labs: No results for input(s): HGB, HCT, PLT, APTT, LABPROT, INR, HEPARINUNFRC, HEPRLOWMOCWT, CREATININE, CKTOTAL, CKMB, TROPONINIHS in the last 72 hours.  CrCl cannot be calculated (Patient's most recent lab result is older than the maximum 21 days allowed.).   Medical History: Past Medical History:  Diagnosis Date  . Adrenal mass ([place])    per pt this is remote (10 years) and benign by biopsy  . AICD (automatic cardioverter/defibrillator) present   . Anxiety   . Arthritis   . CAD (coronary artery disease)    a. s/p PCI to LAD 2001 b. myoview 2014 high risk with scar LAD/RCA territory but no ischemia  . Cardiomyopathy, ischemic   . CHF (congestive heart failure) ([name])    class II/III  . COPD (chronic obstructive pulmonary disease) ([name])    smokes cigars but has quit cigarettes  . Defibrillator discharge 04/21/2019  . Dyspnea   . Flu 03/2015  . HTN (hypertension)   . Hyperlipidemia   . NSVT (nonsustained ventricular tachycardia) ([place]) 03/05/2017  . Paroxysmal atrial fibrillation ([name]) 03/2015   chads2vasc score is 4  . PSA (psoriatic arthritis) ([place])    increased  . PVD (peripheral vascular disease) ([name])     Medications:  (Not in a hospital admission)   Assessment: 14 YOM who presented with palpitation s/p ICD shocks found to be in VTach s/p amio bolus and drip. Pharmacy consulted to start IV heparin for AFib. Of note, patient is on Xarelto at home and last dose was yesterday night at 9 PM.   H/H and plt wnl. SCr 1.01  Goal of Therapy:  Heparin level 0.3-0.7 units/ml aPTT 66-102 seconds Monitor platelets by anticoagulation protocol: Yes    Plan:  -Start IV heparin at 1000 units/hr  -F/u 8 hr HL, aPTT -Monitor daily HL, aPTT, CBC and s/s of bleeding   Albertina Parr, PharmD., BCPS, BCCCP Clinical Pharmacist Please refer to [facility] for unit-specific pharmacist

## 2020-06-01 NOTE — Telephone Encounter (Signed)
Alert remote transmission for VT event with therapies 05/31/20 3:47pm.  1 wide complex VT event with ATP delivered unsuccessful converting but did accelerate rate into VF zone 25J shock delivered and successful with converting back to sinus rhythm.  Of note capture rhythm this morning showed AP/VS w/ freq PVCs.  Known hx VT.  Meds: Coreg. Xarelto.   Per epic note 01/07/20 w/ PA-C consider ADD if recurrent VT.    Attempted to contact patient. Attempted both numbers on file. No answer, LMOVM.

## 2020-06-02 ENCOUNTER — Encounter (HOSPITAL_COMMUNITY): Payer: Self-pay | Admitting: Cardiology

## 2020-06-02 DIAGNOSIS — I472 Ventricular tachycardia: Secondary | ICD-10-CM | POA: Diagnosis not present

## 2020-06-02 LAB — HEPARIN LEVEL (UNFRACTIONATED): Heparin Unfractionated: 0.89 IU/mL — ABNORMAL HIGH (ref 0.30–0.70)

## 2020-06-02 LAB — BASIC METABOLIC PANEL
Anion gap: 6 (ref 5–15)
BUN: 6 mg/dL — ABNORMAL LOW (ref 8–23)
CO2: 26 mmol/L (ref 22–32)
Calcium: 8.4 mg/dL — ABNORMAL LOW (ref 8.9–10.3)
Chloride: 104 mmol/L (ref 98–111)
Creatinine, Ser: 0.89 mg/dL (ref 0.61–1.24)
GFR, Estimated: >60 mL/min (ref >60)
Glucose, Bld: 100 mg/dL — ABNORMAL HIGH (ref 70–99)
Potassium: 4.4 mmol/L (ref 3.5–5.1)
Sodium: 136 mmol/L (ref 135–145)

## 2020-06-02 LAB — MAGNESIUM: Magnesium: 2.1 mg/dL (ref 1.7–2.4)

## 2020-06-02 LAB — TROPONIN I (HIGH SENSITIVITY): Troponin I (High Sensitivity): 456 ng/L (ref <18)

## 2020-06-02 LAB — APTT: aPTT: 82 seconds — ABNORMAL HIGH (ref 24–36)

## 2020-06-02 MED ORDER — ENSURE ENLIVE PO LIQD
237.0000 mL | Freq: Three times a day (TID) | ORAL | Status: DC
  Administered 2020-06-02 – 2020-06-04 (×5): 237 mL via ORAL

## 2020-06-02 MED ORDER — ADULT MULTIVITAMIN W/MINERALS CH
1.0000 | ORAL_TABLET | Freq: Every day | ORAL | Status: DC
  Administered 2020-06-02 – 2020-06-04 (×3): 1 via ORAL
  Filled 2020-06-02 (×3): qty 1

## 2020-06-02 MED ORDER — MOMETASONE FURO-FORMOTEROL FUM 100-5 MCG/ACT IN AERO
2.0000 | INHALATION_SPRAY | Freq: Two times a day (BID) | RESPIRATORY_TRACT | Status: DC
  Administered 2020-06-02 – 2020-06-04 (×4): 2 via RESPIRATORY_TRACT
  Filled 2020-06-02: qty 8.8

## 2020-06-02 MED ORDER — FUROSEMIDE 20 MG PO TABS
20.0000 mg | ORAL_TABLET | Freq: Every day | ORAL | Status: DC
  Administered 2020-06-02 – 2020-06-04 (×3): 20 mg via ORAL
  Filled 2020-06-02 (×3): qty 1

## 2020-06-02 MED ORDER — SPIRONOLACTONE 25 MG PO TABS
25.0000 mg | ORAL_TABLET | Freq: Every day | ORAL | Status: DC
  Administered 2020-06-02 – 2020-06-03 (×2): 25 mg via ORAL
  Filled 2020-06-02 (×2): qty 1

## 2020-06-02 MED ORDER — SACUBITRIL-VALSARTAN 49-51 MG PO TABS
1.0000 | ORAL_TABLET | Freq: Two times a day (BID) | ORAL | Status: DC
  Administered 2020-06-02 – 2020-06-04 (×5): 1 via ORAL
  Filled 2020-06-02 (×7): qty 1

## 2020-06-02 MED ORDER — RIVAROXABAN 20 MG PO TABS
20.0000 mg | ORAL_TABLET | Freq: Every day | ORAL | Status: DC
  Administered 2020-06-02 – 2020-06-03 (×2): 20 mg via ORAL
  Filled 2020-06-02 (×2): qty 1

## 2020-06-02 MED ORDER — POTASSIUM CHLORIDE CRYS ER 20 MEQ PO TBCR
20.0000 meq | EXTENDED_RELEASE_TABLET | Freq: Every day | ORAL | Status: DC
  Administered 2020-06-02 – 2020-06-04 (×3): 20 meq via ORAL
  Filled 2020-06-02 (×3): qty 1

## 2020-06-02 MED ORDER — MAGNESIUM SULFATE IN D5W 1-5 GM/100ML-% IV SOLN
1.0000 g | Freq: Once | INTRAVENOUS | Status: AC
  Administered 2020-06-02: 1 g via INTRAVENOUS
  Filled 2020-06-02: qty 100

## 2020-06-02 MED ORDER — CHLORHEXIDINE GLUCONATE CLOTH 2 % EX PADS
6.0000 | MEDICATED_PAD | Freq: Every day | CUTANEOUS | Status: DC
  Administered 2020-06-02 – 2020-06-04 (×3): 6 via TOPICAL

## 2020-06-02 NOTE — Plan of Care (Signed)

## 2020-06-02 NOTE — Progress Notes (Signed)
Initial Nutrition Assessment  DOCUMENTATION CODES:   Non-severe (moderate) malnutrition in context of chronic illness  INTERVENTION:   -Increase Ensure Enlive po to TID, each supplement provides 350 kcal and 20 grams of protein -MVI with minerals daily  NUTRITION DIAGNOSIS:   Moderate Malnutrition related to chronic illness (CHF, COPD) as evidenced by energy intake < or equal to 75% for > or equal to 1 month,mild fat depletion,mild muscle depletion,moderate muscle depletion.  GOAL:   Patient will meet greater than or equal to 90% of their needs  MONITOR:   PO intake,Supplement acceptance,Labs,Weight trends,Skin,I & O's  REASON FOR ASSESSMENT:   Malnutrition Screening Tool    ASSESSMENT:   Benjamin Cook is a 75 y.o. male with history of anxiety, CAD (s/p PCI to LAD 2001), ICM, chronic CHF (systolic), ICD, HTN, HLD, VT, AFib, AAA (s/p endovascular repair, Dr. Donzetta Matters), COPD  Pt admitted with VT storm.   Reviewed I/O's: +599 ml x 24 hours  UOP: 350 ml x 24 hours  Case discussed with RN, who reports pt is tolerating meals well and drinking Ensure.  Spoke with pt at bedside, who was pleasant and in good spirits today. He reports his appetite has improved since being admitted to hospital and estimated he consumed half of his breakfast today (noted meal completions 40-50%).   Pt shares that his appetite is far at baseline and he usually only consumes one meal per day, which consists of a meat starch, and vegetable. Per pt, the only beverage he drinks is water. He also admits to snacking on peanuts and candy late at night.   Reviewed wt hx; pt has experienced a 5.2% wt loss over the past 6 months, which is not significant for time frame.   Discussed importance of good meal and supplement intake to promote healing. Pt like Ensure supplements and is amenable to continue them.   Per MD, pt will ilkely discharge home later this weekend.   Medications reviewed and include lasix  and amiodarone.  Labs reviewed.   NUTRITION - FOCUSED PHYSICAL EXAM:  Flowsheet Row Most Recent Value  Orbital Region No depletion  Upper Arm Region Mild depletion  Thoracic and Lumbar Region Mild depletion  Buccal Region Mild depletion  Temple Region Moderate depletion  Clavicle Bone Region No depletion  Clavicle and Acromion Bone Region No depletion  Scapular Bone Region No depletion  Dorsal Hand Mild depletion  Patellar Region Moderate depletion  Anterior Thigh Region Moderate depletion  Posterior Calf Region Moderate depletion  Edema (RD Assessment) Mild  Hair Reviewed  Eyes Reviewed  Mouth Reviewed  Skin Reviewed  Nails Reviewed       Diet Order:   Diet Order            Diet Heart Room service appropriate? Yes; Fluid consistency: Thin  Diet effective now                 EDUCATION NEEDS:   Education needs have been addressed  Skin:  Skin Assessment: Reviewed RN Assessment  Last BM:  06/02/20  Height:   Ht Readings from Last 1 Encounters:  03/31/20 5\' 10"  (1.778 m)    Weight:   Wt Readings from Last 1 Encounters:  06/02/20 62.4 kg    Ideal Body Weight:  75.5 kg  BMI:  Body mass index is 19.74 kg/m.  Estimated Nutritional Needs:   Kcal:  1900-2100  Protein:  95-110 grams  Fluid:  > 1.9 L    Loistine Chance, RD,  LDN, [place] Registered Dietitian II Certified Diabetes Care and Education Specialist Please refer to [facility] for RD and/or RD on-call/weekend/after hours pager

## 2020-06-02 NOTE — ED Provider Notes (Signed)
[facility] Provider Note   CSN: [id number] Arrival date & time: 06/01/20  1622     History No chief complaint on file.   ABRUM HENNINGTON is a 75 y.o. male.  HPI      75yo male with history of CAD, ischemic cardiomyopathy, chronic systolic CHF, AICD in place, htn, hlpd, hx of VT, atrial fibrillation, AAA s/p endocvascular repair, COPD, who presents with concern for multiple episodes of defibrillator firing.   Denies recent illness or changes, reports chronically low appetite, chronic occ episodes of emesis but no acute changes, no acute worsening/vomiting/diarrhea/black or bloody stools, fevers, cough. Has some chronic dyspnea and fatigue.  Last night, he was woken by his defibrillator firing however was able to sleep well and felt ok. Today, he mowed the lawn and then again had ICD fire followed by multiple, reports he was shocked 7 times prior to EMS coming.  EMS reports he was shocked twice.  They gave him 2 150mg  bolus of amiodarone. MR. Berardi reports he feels fatigued and is continuing to have episodes of near-syncope/dizziness.    Past Medical History:  Diagnosis Date  . Adrenal mass ([place])    per pt this is remote (10 years) and benign by biopsy  . AICD (automatic cardioverter/defibrillator) present   . Anxiety   . Arthritis   . CAD (coronary artery disease)    a. s/p PCI to LAD 2001 b. myoview 2014 high risk with scar LAD/RCA territory but no ischemia  . Cardiomyopathy, ischemic   . CHF (congestive heart failure) ([name])    class II/III  . COPD (chronic obstructive pulmonary disease) ([name])    smokes cigars but has quit cigarettes  . Defibrillator discharge 04/21/2019  . Diverticulitis   . Dyspnea   . Flu 03/2015  . HTN (hypertension)   . Hyperlipidemia   . NSVT (nonsustained ventricular tachycardia) ([place]) 03/05/2017  . Paroxysmal atrial fibrillation ([name]) 03/2015   chads2vasc score is 4  . PSA (psoriatic arthritis) ([place])    increased  . PVD  (peripheral vascular disease) [facility])     Patient Active Problem List   Diagnosis Date Noted  . Defibrillator discharge 04/22/2019  . Hypocalcemia 08/08/2017  . Screening examination for infectious disease 08/08/2017  . Anemia, iron deficiency 08/08/2017  . NSVT (nonsustained ventricular tachycardia) ([place]) 03/05/2017  . AAA (abdominal aortic aneurysm) ([place]) 03/03/2017  . Low back pain 01/01/2017  . Adrenal mass ([place]) 11/24/2016  . Acute Sigmoid diverticulitis 11/15/2016  . Diverticulitis 11/15/2016  . ICD (implantable cardioverter-defibrillator) lead failure 07/16/2016  . Paroxysmal atrial fibrillation ([place]) 06/26/2015  . Dyspnea 04/10/2015  . Acute respiratory failure ([place]) 03/28/2015  . Essential hypertension 03/28/2015  . Systolic CHF ([place]) 0000000  . Pyrexia   . Shortness of breath   . Influenza A   . VT (ventricular tachycardia) ([place])   . ICD (implantable cardioverter-defibrillator) in place 06/08/2010  . Chronic systolic heart failure ([place]) 04/18/2010  . WEIGHT LOSS 11/01/2008  . TOBACCO ABUSE 09/06/2008  . Aneurysm of abdominal vessel (5.6 CM) 09/06/2008  . HLD (hyperlipidemia) 09/05/2008  . COUGH 04/08/2007  . PSA, INCREASED 04/08/2007  . OTHER ABNORMAL BLOOD CHEMISTRY 03/24/2007  . Adrenal nodule ([place]) 10/01/2006  . Anxiety state 10/01/2006  . HYPERTENSION 10/01/2006  . Coronary atherosclerosis 10/01/2006  . Cardiomyopathy, ischemic 10/01/2006  . COPD, severe ([place]) 10/01/2006  . HYPERGLYCEMIA 10/01/2006  . HEMATURIA, HX OF 10/01/2006    Past Surgical History:  Procedure Laterality Date  . ABDOMINAL  AORTIC ENDOVASCULAR STENT GRAFT N/A 03/03/2017   Procedure: ABDOMINAL AORTIC ENDOVASCULAR STENT GRAFT;  Surgeon: Waynetta Sandy, MD;  Location: [facility];  Service: Vascular;  Laterality: N/A;  . cataract surgery    . CORONARY ANGIOPLASTY WITH STENT PLACEMENT  2001   a. PCI to LAD  . IMPLANTABLE CARDIOVERTER DEFIBRILLATOR (ICD) GENERATOR CHANGE N/A  03/18/2014   a. SJM Fortify ST DR ICD implanted by [facility] for primary prevention b. gen change 03/2014   . LEAD REVISION/REPAIR N/A 07/17/2016   Procedure: Lead Revision/Repair;  Surgeon: Evans Lance, MD;  Location: [facility] CV LAB;  Service: Cardiovascular;  Laterality: N/A;  . LEFT HEART CATHETERIZATION WITH CORONARY ANGIOGRAM N/A 05/27/2014   Procedure: LEFT HEART CATHETERIZATION WITH CORONARY ANGIOGRAM;  Surgeon: Sherren Mocha, MD;  Location: [facility] CATH LAB;  Service: Cardiovascular;  Laterality: N/A;  . REPAIR KNEE LIGAMENT         Family History  Problem Relation Age of Onset  . Cirrhosis Mother        due to ETOH  . Cancer Neg Hx     Social History   Tobacco Use  . Smoking status: Current Every Day Smoker    Types: Cigars  . Smokeless tobacco: Never Used  . Tobacco comment: smokes cigars now, no longer smokes cigarettes but previously smoked 2ppd  Vaping Use  . Vaping Use: Never used  Substance Use Topics  . Alcohol use: No  . Drug use: No    Home Medications Prior to Admission medications   Medication Sig Start Date End Date Taking? Authorizing Provider  acetaminophen (TYLENOL) 500 MG tablet Take 500-1,000 mg by mouth every 8 (eight) hours as needed (for pain).    Yes [provider]  atorvastatin (LIPITOR) 80 MG tablet TAKE 1 TABLET BY MOUTH  DAILY AT 6 PM Patient taking differently: Take 80 mg by mouth every evening. 04/19/20  Yes Lelon Perla, MD  carvedilol (COREG) 25 MG tablet Take 1 tablet (25 mg total) by mouth 2 (two) times daily with a meal. 04/19/20  Yes Allred, Jeneen Rinks, MD  Fluticasone-Salmeterol (ADVAIR) 100-50 MCG/DOSE AEPB Inhale 1 puff into the lungs 2 (two) times daily. 01/07/20  Yes Shirley Friar, PA-C  furosemide (LASIX) 40 MG tablet Take 0.5 tablets (20 mg total) by mouth daily. 04/19/20  Yes Allred, Jeneen Rinks, MD  hydrALAZINE (APRESOLINE) 25 MG tablet Take 1 tablet (25 mg total) by mouth 3 (three) times daily. 11/17/19 02/15/20 Yes  Tillery, Satira Mccallum, PA-C  isosorbide mononitrate (IMDUR) 30 MG 24 hr tablet Take 0.5 tablets (15 mg total) by mouth daily. 11/17/19  Yes Shirley Friar, PA-C  Multiple Vitamin (MULTIVITAMIN) capsule Take 1 capsule by mouth daily.   Yes [provider]  Omega-3 Fatty Acids (FISH OIL) 1200 MG CAPS Take 1,200 mg by mouth daily.   Yes [provider]  potassium chloride SA (KLOR-CON) 20 MEQ tablet TAKE 1 TABLET BY MOUTH  DAILY Patient taking differently: Take 20 mEq by mouth daily. 02/24/20  Yes Lelon Perla, MD  rivaroxaban (XARELTO) 20 MG TABS tablet Take 1 tablet (20 mg total) by mouth daily with supper. 03/31/20  Yes Baldwin Jamaica, PA-C  sacubitril-valsartan (ENTRESTO) 49-51 MG Take 1 tablet by mouth 2 (two) times daily. 03/31/20  Yes Baldwin Jamaica, PA-C  spironolactone (ALDACTONE) 25 MG tablet Take 1 tablet (25 mg total) by mouth at bedtime. 01/07/20  Yes Shirley Friar, PA-C    Allergies    Patient has  no known allergies.  Review of Systems   Review of Systems  Constitutional: Positive for fatigue. Negative for fever. Appetite change: no significant change but is low chronically.  Eyes: Negative for visual disturbance.  Respiratory: Negative for shortness of breath (no significant changes).   Cardiovascular: Negative for chest pain.  Gastrointestinal: Negative for abdominal pain, blood in stool, diarrhea, nausea and vomiting.  Genitourinary: Negative for difficulty urinating.  Musculoskeletal: Negative for neck stiffness.  Skin: Negative for rash.  Neurological: Positive for light-headedness. Negative for headaches. Syncope: unclear if syncope or pre-syncope.    Physical Exam Updated Vital Signs BP 129/68   Pulse 62   Temp 98.1 F (36.7 C) (Oral)   Resp (!) 24   Wt 62.4 kg   SpO2 94%   BMI 19.74 kg/m   Physical Exam Vitals and nursing note reviewed.  Constitutional:      General: He is not in acute distress.    Appearance:  He is well-developed. He is ill-appearing. He is not diaphoretic.  HENT:     Head: Normocephalic and atraumatic.  Eyes:     Conjunctiva/sclera: Conjunctivae normal.  Cardiovascular:     Rate and Rhythm: Regular rhythm. Tachycardia present.     Heart sounds: Normal heart sounds. No murmur heard. No friction rub. No gallop.   Pulmonary:     Effort: Pulmonary effort is normal. No respiratory distress.     Breath sounds: Normal breath sounds. No wheezing or rales.  Abdominal:     General: There is no distension.     Palpations: Abdomen is soft.     Tenderness: There is no abdominal tenderness. There is no guarding.  Musculoskeletal:     Cervical back: Normal range of motion.  Skin:    General: Skin is warm and dry.  Neurological:     Mental Status: He is alert and oriented to person, place, and time.     ED Results / Procedures / Treatments   Labs (all labs ordered are listed, but only abnormal results are displayed) Labs Reviewed  CBC WITH DIFFERENTIAL/PLATELET - Abnormal; Notable for the following components:      Result Value   WBC 13.6 (*)    Neutro Abs 11.1 (*)    Monocytes Absolute 1.3 (*)    All other components within normal limits  COMPREHENSIVE METABOLIC PANEL - Abnormal; Notable for the following components:   Potassium 3.2 (*)    Glucose, Bld 151 (*)    BUN 6 (*)    Calcium 8.7 (*)    Total Bilirubin 1.3 (*)    All other components within normal limits  BRAIN NATRIURETIC PEPTIDE - Abnormal; Notable for the following components:   B Natriuretic Peptide 127.2 (*)    All other components within normal limits  LACTIC ACID, PLASMA - Abnormal; Notable for the following components:   Lactic Acid, Venous 2.8 (*)    All other components within normal limits  BASIC METABOLIC PANEL - Abnormal; Notable for the following components:   Glucose, Bld 100 (*)    BUN 6 (*)    Calcium 8.4 (*)    All other components within normal limits  APTT - Abnormal; Notable for the  following components:   aPTT 82 (*)    All other components within normal limits  HEPARIN LEVEL (UNFRACTIONATED) - Abnormal; Notable for the following components:   Heparin Unfractionated 0.89 (*)    All other components within normal limits  TROPONIN I (HIGH SENSITIVITY) - Abnormal; Notable for  the following components:   Troponin I (High Sensitivity) 111 (*)    All other components within normal limits  TROPONIN I (HIGH SENSITIVITY) - Abnormal; Notable for the following components:   Troponin I (High Sensitivity) 421 (*)    All other components within normal limits  TROPONIN I (HIGH SENSITIVITY) - Abnormal; Notable for the following components:   Troponin I (High Sensitivity) 456 (*)    All other components within normal limits  RESP PANEL BY RT-PCR (FLU A&B, COVID) ARPGX2  MRSA PCR SCREENING  MAGNESIUM  LACTIC ACID, PLASMA  MAGNESIUM    EKG EKG Interpretation  Date/Time:  Thursday June 01 2020 16:37:32 EDT Ventricular Rate:  186 PR Interval:  37 QRS Duration: 188 QT Interval:  279 QTC Calculation: 491 R Axis:   -86 Text Interpretation: Extreme tachycardia with wide complex, no further rhythm analysis attempted Now in VT Confirmed by Gareth Morgan [phone]) on 06/01/2020 5:00:02 PM   Radiology DG Chest Portable 1 View  Result Date: 06/01/2020 CLINICAL DATA:  Shortness of breath EXAM: PORTABLE CHEST 1 VIEW COMPARISON:  Chest radiograph dated 04/21/2019. FINDINGS: Defibrillator pads overlie the chest. The heart size is normal. Vascular calcifications are seen in the aortic arch. A left subclavian approach cardiac device is redemonstrated. There is minimal left basilar atelectasis. The right lung is clear. There is no pleural effusion or pneumothorax. Degenerative changes are seen in the spine. IMPRESSION: Minimal left basilar atelectasis. Aortic Atherosclerosis (ICD10-I70.0). Electronically Signed   By: Zerita Boers M.D.   On: 06/01/2020 17:12    Procedures .Critical  Care Performed by: Gareth Morgan, MD Authorized by: Gareth Morgan, MD   Critical care provider statement:    Critical care time (minutes):  45   Critical care was time spent personally by me on the following activities:  Discussions with consultants, evaluation of patient's response to treatment, examination of patient, ordering and performing treatments and interventions, ordering and review of laboratory studies, ordering and review of radiographic studies, pulse oximetry, re-evaluation of patient's condition, obtaining history from patient or surrogate and review of old charts     Medications Ordered in ED Medications  amiodarone (NEXTERONE PREMIX) 360-4.14 MG/200ML-% (1.8 mg/mL) IV infusion (0 mg/hr Intravenous Stopped 06/01/20 2244)    Followed by  amiodarone (NEXTERONE PREMIX) 360-4.14 MG/200ML-% (1.8 mg/mL) IV infusion (30 mg/hr Intravenous New Bag/Given 06/02/20 1320)  nitroGLYCERIN (NITROSTAT) SL tablet 0.4 mg (has no administration in time range)  acetaminophen (TYLENOL) tablet 650 mg (has no administration in time range)  atorvastatin (LIPITOR) tablet 80 mg (80 mg Oral Given 06/02/20 0913)  carvedilol (COREG) tablet 25 mg (25 mg Oral Given 06/02/20 0913)  isosorbide mononitrate (IMDUR) 24 hr tablet 15 mg (15 mg Oral Given 06/02/20 0912)  Chlorhexidine Gluconate Cloth 2 % PADS 6 each (6 each Topical Given 06/02/20 0916)  furosemide (LASIX) tablet 20 mg (20 mg Oral Given 06/02/20 0913)  sacubitril-valsartan (ENTRESTO) 49-51 mg per tablet (1 tablet Oral Given 06/02/20 0912)  spironolactone (ALDACTONE) tablet 25 mg (has no administration in time range)  rivaroxaban (XARELTO) tablet 20 mg (20 mg Oral Given 06/02/20 0912)  potassium chloride SA (KLOR-CON) CR tablet 20 mEq (20 mEq Oral Given 06/02/20 0913)  mometasone-formoterol (DULERA) 100-5 MCG/ACT inhaler 2 puff (2 puffs Inhalation Not Given 06/02/20 1120)  multivitamin with minerals tablet 1 tablet (has no administration in time range)   feeding supplement (ENSURE ENLIVE / ENSURE PLUS) liquid 237 mL (has no administration in time range)  amiodarone (NEXTERONE) 1.8 mg/mL load  via infusion 150 mg (150 mg Intravenous Bolus from Bag 06/01/20 1633)  amiodarone (NEXTERONE) 1.8 mg/mL load via infusion 150 mg (150 mg Intravenous Bolus from Bag 06/01/20 1700)  potassium chloride (KLOR-CON) CR tablet 30 mEq (30 mEq Oral Given 06/02/20 0324)  magnesium sulfate IVPB 1 g 100 mL (0 g Intravenous Stopped 06/02/20 0143)    ED Course  I have reviewed the triage vital signs and the nursing notes.  Pertinent labs & imaging results that were available during my care of the patient were reviewed by me and considered in my medical decision making (see chart for details).    MDM Rules/Calculators/A&P                          75yo male with history of CAD, ischemic cardiomyopathy, chronic systolic CHF, AICD in place, htn, hlpd, hx of VT, atrial fibrillation, AAA s/p endocvascular repair, COPD, who presents with concern for multiple episodes of defibrillator firing, suspect around 9 by patient and EMS history.    Arrives to the ED in VT and defibrillated on arrival by his AICD.  Continued to have runs in and out of VT, Gave multiple boluses of amiodarone an started on gtt. Cardiology consulted for admission.     Final Clinical Impression(s) / ED Diagnoses Final diagnoses:  Ventricular tachycardia [facility])    Rx / DC Orders ED Discharge Orders    None       Gareth Morgan, MD 06/02/20 1558

## 2020-06-02 NOTE — Telephone Encounter (Signed)
Chart review reflects pt went to ED via EMS and admitted due to recurrent VT.

## 2020-06-02 NOTE — Progress Notes (Addendum)
Progress Note  Patient Name: Benjamin Cook Date of Encounter: 06/02/2020  [facility] Cardiologist: Kirk Ruths, MD   Subjective   I slept well.  No CP, no SOB  Inpatient Medications    Scheduled Meds:  atorvastatin  80 mg Oral Daily   carvedilol  25 mg Oral BID WC   Chlorhexidine Gluconate Cloth  6 each Topical Daily   feeding supplement  237 mL Oral BID BM   isosorbide mononitrate  15 mg Oral Daily   Continuous Infusions:  amiodarone 30 mg/hr (06/02/20 0700)   heparin 1,000 Units/hr (06/02/20 0700)   PRN Meds: acetaminophen, nitroGLYCERIN   Vital Signs    Vitals:   06/02/20 0530 06/02/20 0600 06/02/20 0700 06/02/20 0800  BP: (!) 116/56 (!) 106/48 (!) 146/64   Pulse: 60 (!) 59 67   Resp: (!) 21 (!) 23 17   Temp:    99.3 F (37.4 C)  TempSrc:    Oral  SpO2: 95% 95% 95%   Weight:        Intake/Output Summary (Last 24 hours) at 06/02/2020 0808 Last data filed at 06/02/2020 0700 Gross per 24 hour  Intake 948.81 ml  Output 350 ml  Net 598.81 ml   Last 3 Weights 06/02/2020 06/01/2020 03/31/2020  Weight (lbs) 137 lb 9.1 oz 137 lb 9.1 oz 143 lb  Weight (kg) 62.4 kg 62.4 kg 64.864 kg      Telemetry    AP/VS - Personally Reviewed  ECG    No new EKGs - Personally Reviewed  Physical Exam   GEN: No acute distress.   Neck: No JVD Cardiac: RRR, 1/6 SM, rubs, or gallops.  Respiratory: some soft rhonchi R base, no wheesing. GI: Soft, nontender, non-distended  MS: No edema; No deformity. Neuro:  Nonfocal  Psych: Normal affect   Labs    High Sensitivity Troponin:   Recent Labs  Lab 06/01/20 1745 06/01/20 2013  TROPONINIHS 111* 421*      Chemistry Recent Labs  Lab 06/01/20 1745 06/02/20 0655  NA 135 136  K 3.2* 4.4  CL 99 104  CO2 23 26  GLUCOSE 151* 100*  BUN 6* 6*  CREATININE 1.01 0.89  CALCIUM 8.7* 8.4*  PROT 7.1  --   ALBUMIN 3.5  --   AST 34  --   ALT 20  --   ALKPHOS 67  --   BILITOT 1.3*  --   GFRNONAA >60 >60  ANIONGAP  13 6     Hematology Recent Labs  Lab 06/01/20 1745  WBC 13.6*  RBC 4.69  HGB 14.8  HCT 44.1  MCV 94.0  MCH 31.6  MCHC 33.6  RDW 14.4  PLT 174    BNP Recent Labs  Lab 06/01/20 1632  BNP 127.2*     DDimer No results for input(s): DDIMER in the last 168 hours.   Radiology    DG Chest Portable 1 View Result Date: 06/01/2020 CLINICAL DATA:  Shortness of breath EXAM: PORTABLE CHEST 1 VIEW COMPARISON:  Chest radiograph dated 04/21/2019. FINDINGS: Defibrillator pads overlie the chest. The heart size is normal. Vascular calcifications are seen in the aortic arch. A left subclavian approach cardiac device is redemonstrated. There is minimal left basilar atelectasis. The right lung is clear. There is no pleural effusion or pneumothorax. Degenerative changes are seen in the spine. IMPRESSION: Minimal left basilar atelectasis. Aortic Atherosclerosis (ICD10-I70.0). Electronically Signed   By: Zerita Boers M.D.   On: 06/01/2020 17:12  Cardiac Studies   11/23/2019: stress myoview Nuclear stress EF: 22%. No T wave inversion was noted during stress. There was no ST segment deviation noted during stress. Defect 1: There is a large defect of severe severity present in the basal inferior, mid anteroseptal, mid inferoseptal, mid inferior, apical septal, apical inferior, apical lateral and apex location. Findings consistent with prior myocardial infarction with peri-infarct ischemia. This is a high risk study. The left ventricular ejection fraction is severely decreased (<30%).   Large size, severe severity mostly fixed (SDS 3) apical to mid inferior, apical, apical septal and anteroapical perfusion defect suggestive of mid to distal LAD and RCA territory scar with minimal peri-infarct ischemia. LVEF 22% with akinesis of the previously mentioned walls. This is a high risk study. Compared to a prior study on 12/21/2012, there is no significant change.       04/09/2019; TTE IMPRESSIONS   1.  Left ventricular ejection fraction, by estimation, is 35%%. The left  ventricle has moderate to severely decreased function. There is mild left  ventricular hypertrophy. Left ventricular diastolic parameters are  consistent with Grade I diastolic  dysfunction (impaired relaxation).   2. Right ventricular systolic function is normal. The right ventricular  size is normal.   3. The mitral valve is normal in structure and function. Trivial mitral  valve regurgitation.   4. The aortic valve is abnormal. Aortic valve regurgitation is trivial.  Mild aortic valve sclerosis is present, with no evidence of aortic valve  stenosis.   5. The inferior vena cava is normal in size with greater than 50%  respiratory variability, suggesting right atrial pressure of 3 mmHg.   Patient Profile     75 y.o. male with history of anxiety, CAD (s/p PCI to LAD 2001), ICM, chronic CHF (systolic), ICD, HTN, HLD, VT, AFib, AAA (s/p endovascular repair, Dr. Donzetta Matters), COPD  Admitted with VT storm  Device information SJM dual chamber ICD,  Implanted 2012, gen change 03/18/2014 Has an abandoned RV lead (fracture), new lead placed 2018   Known A lead noise   04/21/2019 @ 9:31PM VT episode This was a true MMVT, 203bpm, one ATP delivered failed, , one HV therapy, 25J with clean break ATP therapies were added 10/30/19: VT w/HV tx (pt preferred to hold off AAD), stress with only peri-infarct ischemia, not felt to need cath   AAD Given amiodarone in ER March 2021, None to date otherwise   Interrogation Battery and lead measurements are stable In total 74 ATP  Therapies and 14 HV therapies All appropriate All were MMVT episodes, but did have two different VT's Largely ATP failed though a couple did work Also a couple ATPs that slowed his VT to below detection and labeled return to sinus  Assessment & Plan    1. VT storm     no VT overnight     Continue amio gtt today  Mild hypokalemia replaced last night, better  today       2. CAD      No anginal symptoms      abnormal Trop though he had no anginal symptoms, suspect 2/2 14 shocks      Stress test in Oct as noted, peri-infarct ischemia      No plans for cath   3. ICM     Not overtly volume OL     Labs/lytes stable     Resume home meds  4.  Paroxysmal AFib      CHA2DS2Vasc is 5  Bettylou Frew transition back to xarelto  5. COPD     home inhaler   For questions or updates, please contact [facility] Please consult [email or web address] for contact info under        Signed, Baldwin Jamaica, PA-C  06/02/2020, 8:08 AM    I have seen and examined this patient with Tommye Standard.  Agree with above, note added to reflect my findings.  On exam, RRR, no murmurs.  Fortunately patient remains in sinus rhythm without further ventricular tachycardia.  We Dquan Cortopassi continue IV amiodarone throughout the day today.  We Thong Feeny switch him to p.o. amiodarone tomorrow with likely discharge either Sunday or Monday.  He is now back on his heart failure medications.  His troponin did rise to 400, though with multiple ICD shocks, this could be the cause.  He had no chest pain or ischemic symptoms and thus we Audia Amick hold off on catheterization.  His potassium was low on initial check.  This could be the cause of his arrhythmia, though I would expect it to be a little bit lower.  Potassium has since been repleted.  Briawna Carver M. Monita Swier MD 06/02/2020 12:35 PM

## 2020-06-03 LAB — BASIC METABOLIC PANEL
Anion gap: 6 (ref 5–15)
BUN: 10 mg/dL (ref 8–23)
CO2: 28 mmol/L (ref 22–32)
Calcium: 8.8 mg/dL — ABNORMAL LOW (ref 8.9–10.3)
Chloride: 103 mmol/L (ref 98–111)
Creatinine, Ser: 0.92 mg/dL (ref 0.61–1.24)
GFR, Estimated: >60 mL/min (ref >60)
Glucose, Bld: 119 mg/dL — ABNORMAL HIGH (ref 70–99)
Potassium: 4 mmol/L (ref 3.5–5.1)
Sodium: 137 mmol/L (ref 135–145)

## 2020-06-03 LAB — MAGNESIUM: Magnesium: 1.9 mg/dL (ref 1.7–2.4)

## 2020-06-03 MED ORDER — MAGNESIUM SULFATE 2 GM/50ML IV SOLN
2.0000 g | Freq: Once | INTRAVENOUS | Status: AC
  Administered 2020-06-03: 2 g via INTRAVENOUS
  Filled 2020-06-03: qty 50

## 2020-06-03 MED ORDER — AMIODARONE HCL 200 MG PO TABS
400.0000 mg | ORAL_TABLET | Freq: Two times a day (BID) | ORAL | Status: DC
  Administered 2020-06-03 – 2020-06-04 (×3): 400 mg via ORAL
  Filled 2020-06-03 (×3): qty 2

## 2020-06-03 NOTE — Progress Notes (Addendum)
Progress Note  Patient Name: Benjamin Cook Date of Encounter: 06/03/2020  [facility] Cardiologist: Kirk Ruths, MD   Subjective   Feeling well, no CP or SOB  Inpatient Medications    Scheduled Meds: . atorvastatin  80 mg Oral Daily  . carvedilol  25 mg Oral BID WC  . Chlorhexidine Gluconate Cloth  6 each Topical Daily  . feeding supplement  237 mL Oral TID BM  . furosemide  20 mg Oral Daily  . isosorbide mononitrate  15 mg Oral Daily  . mometasone-formoterol  2 puff Inhalation BID  . multivitamin with minerals  1 tablet Oral Daily  . potassium chloride SA  20 mEq Oral Daily  . rivaroxaban  20 mg Oral Q supper  . sacubitril-valsartan  1 tablet Oral BID  . spironolactone  25 mg Oral QHS   Continuous Infusions: . amiodarone 30 mg/hr (06/03/20 0600)   PRN Meds: acetaminophen, nitroGLYCERIN   Vital Signs    Vitals:   06/03/20 0400 06/03/20 0500 06/03/20 0600 06/03/20 0700  BP: 135/61 (!) 144/65 133/63 (!) 143/64  Pulse: (!) 59 63 60 60  Resp: 20 (!) 21 (!) 22 (!) 25  Temp: 99.1 F (37.3 C)     TempSrc:      SpO2: 94% 97% 95% 95%  Weight:  66.6 kg      Intake/Output Summary (Last 24 hours) at 06/03/2020 0757 Last data filed at 06/03/2020 0600 Gross per 24 hour  Intake 844.2 ml  Output 150 ml  Net 694.2 ml   Last 3 Weights 06/03/2020 06/02/2020 06/01/2020  Weight (lbs) 146 lb 13.2 oz 137 lb 9.1 oz 137 lb 9.1 oz  Weight (kg) 66.6 kg 62.4 kg 62.4 kg      Telemetry    Atrial paced, ventricular sensed-personally reviewed  ECG    None new  Physical Exam   GEN: Well nourished, well developed, in no acute distress  HEENT: normal  Neck: no JVD, carotid bruits, or masses Cardiac: RRR; no murmurs, rubs, or gallops,no edema  Respiratory:  clear to auscultation bilaterally, normal work of breathing GI: soft, nontender, nondistended, + BS MS: no deformity or atrophy  Skin: warm and dry, device site well healed Neuro:  Strength and sensation are  intact Psych: euthymic mood, full affect   Labs    High Sensitivity Troponin:   Recent Labs  Lab 06/01/20 1745 06/01/20 2013 06/02/20 0655  TROPONINIHS 111* 421* 456*      Chemistry Recent Labs  Lab 06/01/20 1745 06/02/20 0655 06/03/20 0046  NA 135 136 137  K 3.2* 4.4 4.0  CL 99 104 103  CO2 23 26 28   GLUCOSE 151* 100* 119*  BUN 6* 6* 10  CREATININE 1.01 0.89 0.92  CALCIUM 8.7* 8.4* 8.8*  PROT 7.1  --   --   ALBUMIN 3.5  --   --   AST 34  --   --   ALT 20  --   --   ALKPHOS 67  --   --   BILITOT 1.3*  --   --   GFRNONAA >60 >60 >60  ANIONGAP 13 6 6      Hematology Recent Labs  Lab 06/01/20 1745  WBC 13.6*  RBC 4.69  HGB 14.8  HCT 44.1  MCV 94.0  MCH 31.6  MCHC 33.6  RDW 14.4  PLT 174    BNP Recent Labs  Lab 06/01/20 1632  BNP 127.2*     DDimer No results for input(s):  DDIMER in the last 168 hours.   Radiology    DG Chest Portable 1 View Result Date: 06/01/2020 CLINICAL DATA:  Shortness of breath EXAM: PORTABLE CHEST 1 VIEW COMPARISON:  Chest radiograph dated 04/21/2019. FINDINGS: Defibrillator pads overlie the chest. The heart size is normal. Vascular calcifications are seen in the aortic arch. A left subclavian approach cardiac device is redemonstrated. There is minimal left basilar atelectasis. The right lung is clear. There is no pleural effusion or pneumothorax. Degenerative changes are seen in the spine. IMPRESSION: Minimal left basilar atelectasis. Aortic Atherosclerosis (ICD10-I70.0). Electronically Signed   By: Zerita Boers M.D.   On: 06/01/2020 17:12    Cardiac Studies   11/23/2019: stress myoview  Nuclear stress EF: 22%.  No T wave inversion was noted during stress.  There was no ST segment deviation noted during stress.  Defect 1: There is a large defect of severe severity present in the basal inferior, mid anteroseptal, mid inferoseptal, mid inferior, apical septal, apical inferior, apical lateral and apex  location.  Findings consistent with prior myocardial infarction with peri-infarct ischemia.  This is a high risk study.  The left ventricular ejection fraction is severely decreased (<30%).   Large size, severe severity mostly fixed (SDS 3) apical to mid inferior, apical, apical septal and anteroapical perfusion defect suggestive of mid to distal LAD and RCA territory scar with minimal peri-infarct ischemia. LVEF 22% with akinesis of the previously mentioned walls. This is a high risk study. Compared to a prior study on 12/21/2012, there is no significant change.       04/09/2019; TTE IMPRESSIONS   1. Left ventricular ejection fraction, by estimation, is 35%%. The left  ventricle has moderate to severely decreased function. There is mild left  ventricular hypertrophy. Left ventricular diastolic parameters are  consistent with Grade I diastolic  dysfunction (impaired relaxation).   2. Right ventricular systolic function is normal. The right ventricular  size is normal.   3. The mitral valve is normal in structure and function. Trivial mitral  valve regurgitation.   4. The aortic valve is abnormal. Aortic valve regurgitation is trivial.  Mild aortic valve sclerosis is present, with no evidence of aortic valve  stenosis.   5. The inferior vena cava is normal in size with greater than 50%  respiratory variability, suggesting right atrial pressure of 3 mmHg.   Patient Profile     75 y.o. male with history of anxiety, CAD (s/p PCI to LAD 2001), ICM, chronic CHF (systolic), ICD, HTN, HLD, VT, AFib, AAA (s/p endovascular repair, Dr. Donzetta Matters), COPD  Admitted with VT storm  Device information SJM dual chamber ICD,  Implanted 2012, gen change 03/18/2014 Has an abandoned RV lead (fracture), new lead placed 2018   Known A lead noise   04/21/2019 @ 9:31PM VT episode This was a true MMVT, 203bpm, one ATP delivered failed, , one HV therapy, 25J with clean break ATP therapies were added 10/30/19:  VT w/HV tx (pt preferred to hold off AAD), stress with only peri-infarct ischemia, not felt to need cath   AAD Given amiodarone in ER March 2021, None to date otherwise   Interrogation Battery and lead measurements are stable In total 74 ATP  Therapies and 14 HV therapies All appropriate All were MMVT episodes, but did have two different VT's Largely ATP failed though a couple did work Also a couple ATPs that slowed his VT to below detection and labeled return to sinus  Assessment & Plan  1.  VT storm: No VT overnight.  We Nikash Mortensen switch amiodarone drip to p.o. amiodarone 400 mg twice daily.  Zandyr Barnhill transfer to floor bed.  His potassium has remained normal.  It is certainly possible that his low potassium was a trigger for his VT.         2.  Coronary artery disease: No anginal symptoms.  His troponin was elevated, though he did not have any anginal symptoms and was quite active before his VT storm occurred.  Continue with current management.   3.  Ischemic cardiomyopathy Not overtly volume overloaded.  Electrolytes/labs stable.  4.  Paroxysmal atrial fibrillation CHA2DS2-VASc of 5.  Continue Xarelto.  5.  COPD: Continue home inhalers.   For questions or updates, please contact [place] Please consult [email or web address] for contact info under        Signed, Amyjo Mizrachi Meredith Leeds, MD  06/03/2020, 7:57 AM

## 2020-06-04 DIAGNOSIS — E440 Moderate protein-calorie malnutrition: Secondary | ICD-10-CM | POA: Insufficient documentation

## 2020-06-04 DIAGNOSIS — R7989 Other specified abnormal findings of blood chemistry: Secondary | ICD-10-CM

## 2020-06-04 DIAGNOSIS — E876 Hypokalemia: Secondary | ICD-10-CM | POA: Diagnosis present

## 2020-06-04 DIAGNOSIS — R778 Other specified abnormalities of plasma proteins: Secondary | ICD-10-CM

## 2020-06-04 LAB — BASIC METABOLIC PANEL
Anion gap: 7 (ref 5–15)
BUN: 9 mg/dL (ref 8–23)
CO2: 28 mmol/L (ref 22–32)
Calcium: 9.1 mg/dL (ref 8.9–10.3)
Chloride: 100 mmol/L (ref 98–111)
Creatinine, Ser: 0.84 mg/dL (ref 0.61–1.24)
GFR, Estimated: >60 mL/min (ref >60)
Glucose, Bld: 103 mg/dL — ABNORMAL HIGH (ref 70–99)
Potassium: 4.4 mmol/L (ref 3.5–5.1)
Sodium: 135 mmol/L (ref 135–145)

## 2020-06-04 LAB — MAGNESIUM: Magnesium: 2 mg/dL (ref 1.7–2.4)

## 2020-06-04 MED ORDER — AMIODARONE HCL 200 MG PO TABS: ORAL_TABLET | ORAL | 5 refills | Status: DC

## 2020-06-04 MED ORDER — ENSURE ENLIVE PO LIQD: ORAL | Status: DC

## 2020-06-04 MED ORDER — NITROGLYCERIN 0.4 MG SL SUBL: 0.4000 mg | SUBLINGUAL_TABLET | SUBLINGUAL | 12 refills | Status: DC | PRN

## 2020-06-04 MED ORDER — MELATONIN 5 MG PO TABS
5.0000 mg | ORAL_TABLET | Freq: Every day | ORAL | Status: DC
  Administered 2020-06-04: 5 mg via ORAL
  Filled 2020-06-04: qty 1

## 2020-06-04 NOTE — Progress Notes (Signed)
Progress Note  Patient Name: Benjamin Cook Date of Encounter: 06/04/2020  [facility] Cardiologist: Kirk Ruths, MD   Subjective   Currently feeling well without chest pain or shortness of breath  Inpatient Medications    Scheduled Meds: . amiodarone  400 mg Oral BID  . atorvastatin  80 mg Oral Daily  . carvedilol  25 mg Oral BID WC  . Chlorhexidine Gluconate Cloth  6 each Topical Daily  . feeding supplement  237 mL Oral TID BM  . furosemide  20 mg Oral Daily  . isosorbide mononitrate  15 mg Oral Daily  . melatonin  5 mg Oral QHS  . mometasone-formoterol  2 puff Inhalation BID  . multivitamin with minerals  1 tablet Oral Daily  . potassium chloride SA  20 mEq Oral Daily  . rivaroxaban  20 mg Oral Q supper  . sacubitril-valsartan  1 tablet Oral BID  . spironolactone  25 mg Oral QHS   Continuous Infusions:  PRN Meds: acetaminophen, nitroGLYCERIN   Vital Signs    Vitals:   06/03/20 2340 06/04/20 0300 06/04/20 0538 06/04/20 0742  BP: (!) 132/46  (!) 122/52 (!) 141/66  Pulse: (!) 59  (!) 59   Resp: 17  20 18   Temp: 98.4 F (36.9 C)  98.2 F (36.8 C) 97.7 F (36.5 C)  TempSrc: Oral  Oral Oral  SpO2: 94%  92% 94%  Weight:  64.1 kg      Intake/Output Summary (Last 24 hours) at 06/04/2020 0753 Last data filed at 06/04/2020 0540 Gross per 24 hour  Intake 593.75 ml  Output 550 ml  Net 43.75 ml   Last 3 Weights 06/04/2020 06/03/2020 06/02/2020  Weight (lbs) 141 lb 5 oz 146 lb 13.2 oz 137 lb 9.1 oz  Weight (kg) 64.1 kg 66.6 kg 62.4 kg      Telemetry    Atrial paced, ventricular sensed-personally reviewed  ECG    None new  Physical Exam   GEN: Well nourished, well developed, in no acute distress  HEENT: normal  Neck: no JVD, carotid bruits, or masses Cardiac: RRR; no murmurs, rubs, or gallops,no edema  Respiratory:  clear to auscultation bilaterally, normal work of breathing GI: soft, nontender, nondistended, + BS MS: no deformity or atrophy  Skin:  warm and dry, device site well healed Neuro:  Strength and sensation are intact Psych: euthymic mood, full affect   Labs    High Sensitivity Troponin:   Recent Labs  Lab 06/01/20 1745 06/01/20 2013 06/02/20 0655  TROPONINIHS 111* 421* 456*      Chemistry Recent Labs  Lab 06/01/20 1745 06/02/20 0655 06/03/20 0046 06/04/20 0127  NA 135 136 137 135  K 3.2* 4.4 4.0 4.4  CL 99 104 103 100  CO2 23 26 28 28   GLUCOSE 151* 100* 119* 103*  BUN 6* 6* 10 9  CREATININE 1.01 0.89 0.92 0.84  CALCIUM 8.7* 8.4* 8.8* 9.1  PROT 7.1  --   --   --   ALBUMIN 3.5  --   --   --   AST 34  --   --   --   ALT 20  --   --   --   ALKPHOS 67  --   --   --   BILITOT 1.3*  --   --   --   GFRNONAA >60 >60 >60 >60  ANIONGAP 13 6 6 7      Hematology Recent Labs  Lab 06/01/20 1745  WBC 13.6*  RBC 4.69  HGB 14.8  HCT 44.1  MCV 94.0  MCH 31.6  MCHC 33.6  RDW 14.4  PLT 174    BNP Recent Labs  Lab 06/01/20 1632  BNP 127.2*     DDimer No results for input(s): DDIMER in the last 168 hours.   Radiology    DG Chest Portable 1 View Result Date: 06/01/2020 CLINICAL DATA:  Shortness of breath EXAM: PORTABLE CHEST 1 VIEW COMPARISON:  Chest radiograph dated 04/21/2019. FINDINGS: Defibrillator pads overlie the chest. The heart size is normal. Vascular calcifications are seen in the aortic arch. A left subclavian approach cardiac device is redemonstrated. There is minimal left basilar atelectasis. The right lung is clear. There is no pleural effusion or pneumothorax. Degenerative changes are seen in the spine. IMPRESSION: Minimal left basilar atelectasis. Aortic Atherosclerosis (ICD10-I70.0). Electronically Signed   By: Zerita Boers M.D.   On: 06/01/2020 17:12    Cardiac Studies   11/23/2019: stress myoview  Nuclear stress EF: 22%.  No T wave inversion was noted during stress.  There was no ST segment deviation noted during stress.  Defect 1: There is a large defect of severe severity  present in the basal inferior, mid anteroseptal, mid inferoseptal, mid inferior, apical septal, apical inferior, apical lateral and apex location.  Findings consistent with prior myocardial infarction with peri-infarct ischemia.  This is a high risk study.  The left ventricular ejection fraction is severely decreased (<30%).   Large size, severe severity mostly fixed (SDS 3) apical to mid inferior, apical, apical septal and anteroapical perfusion defect suggestive of mid to distal LAD and RCA territory scar with minimal peri-infarct ischemia. LVEF 22% with akinesis of the previously mentioned walls. This is a high risk study. Compared to a prior study on 12/21/2012, there is no significant change.       04/09/2019; TTE IMPRESSIONS   1. Left ventricular ejection fraction, by estimation, is 35%%. The left  ventricle has moderate to severely decreased function. There is mild left  ventricular hypertrophy. Left ventricular diastolic parameters are  consistent with Grade I diastolic  dysfunction (impaired relaxation).   2. Right ventricular systolic function is normal. The right ventricular  size is normal.   3. The mitral valve is normal in structure and function. Trivial mitral  valve regurgitation.   4. The aortic valve is abnormal. Aortic valve regurgitation is trivial.  Mild aortic valve sclerosis is present, with no evidence of aortic valve  stenosis.   5. The inferior vena cava is normal in size with greater than 50%  respiratory variability, suggesting right atrial pressure of 3 mmHg.   Patient Profile     75 y.o. male with history of anxiety, CAD (s/p PCI to LAD 2001), ICM, chronic CHF (systolic), ICD, HTN, HLD, VT, AFib, AAA (s/p endovascular repair, Dr. Donzetta Matters), COPD  Admitted with VT storm  Device information SJM dual chamber ICD,  Implanted 2012, gen change 03/18/2014 Has an abandoned RV lead (fracture), new lead placed 2018   Known A lead noise   04/21/2019 @ 9:31PM VT  episode This was a true MMVT, 203bpm, one ATP delivered failed, , one HV therapy, 25J with clean break ATP therapies were added 10/30/19: VT w/HV tx (pt preferred to hold off AAD), stress with only peri-infarct ischemia, not felt to need cath   AAD Given amiodarone in ER March 2021, None to date otherwise   Interrogation Battery and lead measurements are stable In total 74 ATP  Therapies and 14 HV therapies All appropriate All were MMVT episodes, but did have two different VT's Largely ATP failed though a couple did work Also a couple ATPs that slowed his VT to below detection and labeled return to sinus  Assessment & Plan    1.  VT storm: No further VT overnight.  He is switched to p.o. amiodarone yesterday.  At this point, we Jeramyah Goodpasture plan for discharge.  We Mikenzie Mccannon discharge on 400 mg of amiodarone twice daily for the first 2 weeks followed by 400 mg daily for 2 weeks followed by 200 mg a day.  He [place] need follow-up in EP clinic with Dr. Rayann Heman in 1 month.  I did discuss no driving with the patient for at least 6 months.       2.  Coronary artery disease: Had no anginal symptoms.  Troponin was elevated, but this was likely due to his VT storm.    3.  Ischemic cardiomyopathy: Not overtly volume overloaded.  Electrolytes and labs have been stable.    4.  Paroxysmal atrial fibrillation: Currently on Xarelto.  CHA2DS2-VASc of 5.  Has remained in normal rhythm.  5.  COPD: Continue home inhalers.   For questions or updates, please contact [place] Please consult [email or web address] for contact info under        Signed, Miryam Mcelhinney Meredith Leeds, MD  06/04/2020, 7:53 AM

## 2020-06-04 NOTE — Discharge Instructions (Signed)
Amiodarone has been added to your medications.  This is to prevent recurrent ventricular tachycardia.    Take 2 tablets (400 mg) twice a day for 2 weeks (5/1 through 5/14).  Then, reduce the dose to 2 tablets (400 mg) once a day for 2 weeks (5/15 through 5/28).  Then, reduce the dose to 1 tablet (200 mg) once daily.  You will seen Dr. Rayann Heman in 1 month.  The office will call to arrange an appointment.  You should not drive for 6 months.

## 2020-06-04 NOTE — Discharge Summary (Addendum)
Discharge Summary    Patient ID: Benjamin Cook MRN: [id number]; DOB: 05/04/1945  Admit date: 06/01/2020 Discharge date: 06/04/2020  PCP:  Patient, No Pcp Per (Inactive)   [place]  Cardiologist:  Kirk Ruths, MD   Electrophysiologist:  Thompson Grayer, MD  EP - Advanced Practice Provider:  Patsey Berthold, NP       Discharge Diagnoses    Principal Problem:   VT (ventricular tachycardia) [facility]) Active Problems:   Defibrillator discharge   Hypokalemia   Elevated troponin   CAD (coronary artery disease)   HFrEF (heart failure with reduced ejection fraction) ([name])   Paroxysmal atrial fibrillation ([place])   Malnutrition of moderate degree   Diagnostic Studies/Procedures     _____________   History of Present Illness     Benjamin Cook is a 75 y.o. male with coronary artery disease s/p PCI to LAD in 2011, (HFrEF) heart failure with reduced ejection fraction, ischemic cardiomyopathy with EF 35, hypertension, hyperlipidemia, atrial fibrillation, abdominal aortic aneurysm s/p EVAR, COPD, VT s/p ICD with prior ICD shocks.  He was admitted on 06/01/20 with VT storm.  He had several ATP therapies and several shocks at home.  He was brought in by EMS who also reported defib x 2.  He was started on Amiodarone with a bolus en route.  He received bolus x 2 in the ED due to recurrent VT with resolution.  He was admitted for further evaluation.    Hospital Course     Consultants: none    The patient was admitted to our service and followed primarily by EP.  His device was interrogated. The patient was noted to have some VT at a slower rate than his VT zone.  His device was reprogrammed with an additional VT zone at rate 166.  He was continued on IV Amiodarone. Rivaroxaban was held and he was placed on IV heparin per pharmacy in case he need cardiac catheterization.  His hs-Troponins were mildly elevated without trend and felt to be related to multiple ICD shocks.   The pt had a Myoview in 10/21 with large scar and minimal peri-infarct ischemia with recommendations for med Rx.  He had no symptoms of chest pain.  Therefore, he did not require cardiac catheterization and Rivaroxaban was resumed.  He was maintained on Amio gtt without further VT. Of note, his K+ was mildly low on admission and this was replaced.  Nutritional management did see him and it was felt he has moderate (non-severe) malnutrition in the context of chronic illness.  He was started on Ensure three times a day.  His K+ remained normal throughout his admission.  Hypokalemia may have been the trigger for his VT.  He had no further VT.  His Amiodarone was transitioned from IV to oral at 400 mg twice daily on 06/03/20.  He remained stable. He was seen by Dr. Curt Bears this AM and felt to be in stable condition and ready for DC to home.  He was advised to not drive for 6 months.  He Benjamin Cook be discharged on 400 mg of amiodarone twice daily for the first 2 weeks followed by 400 mg daily for 2 weeks followed by 200 mg a day.  He Benjamin Cook need follow-up in EP clinic with Dr. Rayann Heman in 1 month.          Nutrition Status: Nutrition Problem: Moderate Malnutrition Etiology: chronic illness (CHF, COPD) Signs/Symptoms: energy intake < or equal to 75% for > or  equal to 1 month,mild fat depletion,mild muscle depletion,moderate muscle depletion Interventions: Ensure Enlive (each supplement provides 350kcal and 20 grams of protein),MVI     Did the patient have an acute coronary syndrome (MI, NSTEMI, STEMI, etc) this admission?:  No.   The elevated Troponin was due to the acute medical illness (demand ischemia). - Elevated hs-Trop due to ICD shocks     _____________  Discharge Vitals Blood pressure 119/82, pulse 60, temperature 97.7 F (36.5 C), temperature source Oral, resp. rate 18, weight 64.1 kg, SpO2 94 %.  Filed Weights   06/02/20 0500 06/03/20 0500 06/04/20 0300  Weight: 62.4 kg 66.6 kg 64.1 kg    Labs &  Radiologic Studies    CBC Recent Labs    06/01/20 1745  WBC 13.6*  NEUTROABS 11.1*  HGB 14.8  HCT 44.1  MCV 94.0  PLT 086   Basic Metabolic Panel Recent Labs    06/03/20 0046 06/04/20 0127  NA 137 135  K 4.0 4.4  CL 103 100  CO2 28 28  GLUCOSE 119* 103*  BUN 10 9  CREATININE 0.92 0.84  CALCIUM 8.8* 9.1  MG 1.9 2.0   Recent Labs    06/01/20 1745 06/02/20 0655 06/03/20 0046 06/04/20 0127  K 3.2* 4.4 4.0 4.4    Liver Function Tests Recent Labs    06/01/20 1745  AST 34  ALT 20  ALKPHOS 67  BILITOT 1.3*  PROT 7.1  ALBUMIN 3.5   Recent Labs    06/01/20 1659 06/01/20 2012  LATICACIDVEN 2.8* 1.3     High Sensitivity Troponin:   Recent Labs  Lab 06/01/20 1745 06/01/20 2013 06/02/20 0655  TROPONINIHS 111* 421* 456*    Recent Labs    06/01/20 1827  [place]     _____________  DG Chest Portable 1 View  Result Date: 06/01/2020 CLINICAL DATA:  Shortness of breath EXAM: PORTABLE CHEST 1 VIEW COMPARISON:  Chest radiograph dated 04/21/2019. FINDINGS: Defibrillator pads overlie the chest. The heart size is normal. Vascular calcifications are seen in the aortic arch. A left subclavian approach cardiac device is redemonstrated. There is minimal left basilar atelectasis. The right lung is clear. There is no pleural effusion or pneumothorax. Degenerative changes are seen in the spine. IMPRESSION:  Minimal left basilar atelectasis.  Aortic Atherosclerosis (ICD10-I70.0).  Electronically Signed   By: Zerita Boers M.D.   On: 06/01/2020 17:12   Disposition   Pt is being discharged home today in good condition.  Follow-up Plans & Appointments     Follow-up Information    Thompson Grayer, MD Follow up in 1 month(s).   Specialty: Cardiology Why: The office Armen Waring call to arrange a follow up appointment in the next 1 month.  Contact information: [place] [address] [place] [address] [phone]              Discharge Instructions     (HEART FAILURE PATIENTS) Call MD:  Anytime you have any of the following symptoms: 1) 3 pound weight gain in 24 hours or 5 pounds in 1 week 2) shortness of breath, with or without a dry hacking cough 3) swelling in the hands, feet or stomach 4) if you have to sleep on extra pillows at night in order to breathe.   Complete by: As directed    Diet - low sodium heart healthy   Complete by: As directed    Driving Restrictions   Complete by: As directed    NO Driving for  6 months.   Increase activity slowly   Complete by: As directed       Discharge Medications   Allergies as of 06/04/2020   No Known Allergies     Medication List    TAKE these medications   acetaminophen 500 MG tablet Commonly known as: TYLENOL Take 500-1,000 mg by mouth every 8 (eight) hours as needed (for pain).   amiodarone 200 MG tablet Commonly known as: PACERONE Take 2 tabs (400 mg) twice daily for 2 weeks, then 2 tabs (400 mg) once daily for 2 weeks, then 1 tab (200 mg) once daily.   atorvastatin 80 MG tablet Commonly known as: LIPITOR TAKE 1 TABLET BY MOUTH  DAILY AT 6 PM What changed:   how much to take  how to take this  when to take this  additional instructions   carvedilol 25 MG tablet Commonly known as: COREG Take 1 tablet (25 mg total) by mouth 2 (two) times daily with a meal.   Entresto 49-51 MG Generic drug: sacubitril-valsartan Take 1 tablet by mouth 2 (two) times daily.   feeding supplement Liqd Try to drink 1 can 1-2 times a day.   Fish Oil 1200 MG Caps Take 1,200 mg by mouth daily.   Fluticasone-Salmeterol 100-50 MCG/DOSE Aepb Commonly known as: ADVAIR Inhale 1 puff into the lungs 2 (two) times daily.   furosemide 40 MG tablet Commonly known as: LASIX Take 0.5 tablets (20 mg total) by mouth daily.   hydrALAZINE 25 MG tablet Commonly known as: APRESOLINE Take 1 tablet (25 mg total) by mouth 3 (three) times daily.   isosorbide mononitrate 30 MG 24 hr tablet Commonly  known as: IMDUR Take 0.5 tablets (15 mg total) by mouth daily.   multivitamin capsule Take 1 capsule by mouth daily.   nitroGLYCERIN 0.4 MG SL tablet Commonly known as: NITROSTAT Place 1 tablet (0.4 mg total) under the tongue every 5 (five) minutes x 3 doses as needed for chest pain.   potassium chloride SA 20 MEQ tablet Commonly known as: KLOR-CON TAKE 1 TABLET BY MOUTH  DAILY   rivaroxaban 20 MG Tabs tablet Commonly known as: Xarelto Take 1 tablet (20 mg total) by mouth daily with supper.   spironolactone 25 MG tablet Commonly known as: ALDACTONE Take 1 tablet (25 mg total) by mouth at bedtime.          Outstanding Labs/Studies      Duration of Discharge Encounter   Greater than 30 minutes including physician time.  Signed, Richardson Dopp, PA-C 06/04/2020, 10:14 AM   I have seen and examined this patient with Richardson Dopp.  Agree with above, note added to reflect my findings.  On exam, RRR, no murmurs.  Patient presented to the hospital with VT storm.  He was started on IV amiodarone.  His troponin went up to 400, though with 10 ICD shocks and multiple other episodes of VT, I feel that he did not need an ischemic work-up as he did not have any kind of ischemic symptoms.  Potassium is low at 3.2 which was repleted.  He was loaded on amiodarone.  We Genie Wenke arrange for follow-up in EP clinic.  Benjamin Pickron M. Mcguire Gasparyan MD 06/04/2020 11:19 AM

## 2020-07-10 ENCOUNTER — Ambulatory Visit (INDEPENDENT_AMBULATORY_CARE_PROVIDER_SITE_OTHER): Payer: Medicare (Managed Care)

## 2020-07-10 ENCOUNTER — Telehealth: Payer: Self-pay | Admitting: Cardiology

## 2020-07-10 DIAGNOSIS — I5022 Chronic systolic (congestive) heart failure: Secondary | ICD-10-CM

## 2020-07-10 DIAGNOSIS — Z95810 Presence of automatic (implantable) cardiac defibrillator: Secondary | ICD-10-CM | POA: Diagnosis not present

## 2020-07-10 NOTE — Telephone Encounter (Signed)
Spoke to patient's wife Xarelto 20 mg samples Lot # [id number] 4/24 and Entresto 49/51 mg samples  Lot # [id number] 5/24 along with patient assistance forms left at front desk.Patient will bring back forms along with proof of income.Advised to give forms to Dr.Crenshaw's RN.

## 2020-07-10 NOTE — Telephone Encounter (Signed)
Patient calling the office for samples of medication:   1.  What medication and dosage are you requesting samples for? Entresto and xarelto   2.  Are you currently out of this medication? Patient is out

## 2020-07-11 NOTE — Progress Notes (Signed)
EPIC Encounter for ICM Monitoring  Patient Name: Benjamin Cook is a 75 y.o. male Date: 07/11/2020 Primary Care Physican: Patient, No Pcp Per (Inactive) Primary Cardiologist:Crenshaw Electrophysiologist: Allred 4/27/2022Weight:143lbs   Transmission reviewed.    CorVue thoracic impedancesuggestingnormal fluid levels.    Prescribed:  Furosemide 40 mg0.5tablet (20 mg total)daily.   Potassium 20 mEqtake1 tablet daily.  Spironolactone 25 mg take 1 tablet daily  Labs: 06/04/2020 Creatinine 0.84, BUN 9,   Potassium 4.4, Sodium 135, GFR >60 06/03/2020 Creatinine 0.92, BUN 10, Potassium 4.0, Sodium 137, GFR >60  06/02/2020 Creatinine 0.89, BUN 6,   Potassium 4.4, Sodium 136, GFR >60  06/01/2020 Creatinine 1.01, BUN 6,   Potassium 3.2, Sodium 135, GFR >60  03/17/2020 Creatinine 0.90, BUN 5,   Potassium 3.7, Sodium 139  A complete set of results can be found in Results Review.  Recommendations: No changes.  Pt has OV with Dr Rayann Heman 07/12/2020.  Follow-up plan: ICM clinic phone appointment on7/12/2020. 91 day device clinic remote transmission6/22/2022.   EP/Cardiology Office Visits: Recall 8/10/2022with Dr. [name].07/12/2020 with Dr Rayann Heman.  Copy of ICM check sent to Dr.Allred.    3 month ICM trend: 07/10/2020.    1 Year ICM trend:       [name], RN 07/11/2020 8:30 AM

## 2020-07-12 ENCOUNTER — Ambulatory Visit: Payer: Medicare (Managed Care) | Admitting: Internal Medicine

## 2020-07-12 ENCOUNTER — Encounter: Payer: Self-pay | Admitting: Internal Medicine

## 2020-07-12 ENCOUNTER — Other Ambulatory Visit: Payer: Self-pay

## 2020-07-12 VITALS — BP 92/54 | HR 60 | Ht 70.0 in | Wt 140.0 lb

## 2020-07-12 DIAGNOSIS — I255 Ischemic cardiomyopathy: Secondary | ICD-10-CM

## 2020-07-12 DIAGNOSIS — I5022 Chronic systolic (congestive) heart failure: Secondary | ICD-10-CM | POA: Diagnosis not present

## 2020-07-12 DIAGNOSIS — F17200 Nicotine dependence, unspecified, uncomplicated: Secondary | ICD-10-CM | POA: Diagnosis not present

## 2020-07-12 DIAGNOSIS — I10 Essential (primary) hypertension: Secondary | ICD-10-CM | POA: Diagnosis not present

## 2020-07-12 DIAGNOSIS — I480 Paroxysmal atrial fibrillation: Secondary | ICD-10-CM

## 2020-07-12 DIAGNOSIS — R0602 Shortness of breath: Secondary | ICD-10-CM

## 2020-07-12 MED ORDER — DULERA 100-5 MCG/ACT IN AERO
2.0000 | INHALATION_SPRAY | Freq: Two times a day (BID) | RESPIRATORY_TRACT | 1 refills | Status: DC
Start: 2020-07-12 — End: 2022-01-31

## 2020-07-12 MED ORDER — AMIODARONE HCL 200 MG PO TABS: 200.0000 mg | ORAL_TABLET | Freq: Every day | ORAL | 3 refills | Status: DC

## 2020-07-12 NOTE — Patient Instructions (Addendum)
Medication Instructions:  Your physician recommends that you continue on your current medications as directed. Please refer to the Current Medication list given to you today.  Labwork: Pro BNP, BMP, CBC, Hepatic, TSH  Testing/Procedures: None ordered.  Follow-Up: Your physician wants you to follow-up in: 10/20/20 at 9 am      Legrand Como "Jonni Sanger" Tillery, PA-C   You will receive a reminder letter in the mail two months in advance. If you don't receive a letter, please call our office to schedule the follow-up appointment.  Remote monitoring is used to monitor your ICD from home. This monitoring reduces the number of office visits required to check your device to one time per year. It allows [place] to keep an eye on the functioning of your device to ensure it is working properly. You are scheduled for a device check from home on 07/26/20. You may send your transmission at any time that day. If you have a wireless device, the transmission will be sent automatically. After your physician reviews your transmission, you will receive a postcard with your next transmission date.  Any Other Special Instructions Will Be Listed Below (If Applicable).  If you need a refill on your cardiac medications before your next appointment, please call your pharmacy.

## 2020-07-12 NOTE — Progress Notes (Signed)
PCP: Patient, No Pcp Per (Inactive) Primary Cardiologist: Dr [name] Primary EP: Dr Rayann Heman  Benjamin Cook is a 75 y.o. male who presents today for routine electrophysiology followup.  Since last being seen in our clinic, the patient reports doing very well.  Today, he denies symptoms of palpitations, chest pain, lower extremity edema, dizziness, presyncope, syncope, or ICD shocks.  + fatigue and poor exercise tolerance.  + SOB.  The patient is otherwise without complaint today.   Past Medical History:  Diagnosis Date  . Adrenal mass ([place])    per pt this is remote (10 years) and benign by biopsy  . AICD (automatic cardioverter/defibrillator) present   . Anxiety   . Arthritis   . CAD (coronary artery disease)    a. s/p PCI to LAD 2001 b. myoview 2014 high risk with scar LAD/RCA territory but no ischemia  . Cardiomyopathy, ischemic   . CHF (congestive heart failure) ([name])    class II/III  . COPD (chronic obstructive pulmonary disease) ([name])    smokes cigars but has quit cigarettes  . Defibrillator discharge 04/21/2019  . Diverticulitis   . Dyspnea   . Flu 03/2015  . HTN (hypertension)   . Hyperlipidemia   . NSVT (nonsustained ventricular tachycardia) ([place]) 03/05/2017  . Paroxysmal atrial fibrillation ([name]) 03/2015   chads2vasc score is 4  . PSA (psoriatic arthritis) ([place])    increased  . PVD (peripheral vascular disease) ([place])    Past Surgical History:  Procedure Laterality Date  . ABDOMINAL AORTIC ENDOVASCULAR STENT GRAFT N/A 03/03/2017   Procedure: ABDOMINAL AORTIC ENDOVASCULAR STENT GRAFT;  Surgeon: Waynetta Sandy, MD;  Location: [facility];  Service: Vascular;  Laterality: N/A;  . cataract surgery    . CORONARY ANGIOPLASTY WITH STENT PLACEMENT  2001   a. PCI to LAD  . IMPLANTABLE CARDIOVERTER DEFIBRILLATOR (ICD) GENERATOR CHANGE N/A 03/18/2014   a. SJM Fortify ST DR ICD implanted by [facility] for primary prevention b. gen change 03/2014   . LEAD REVISION/REPAIR N/A  07/17/2016   Procedure: Lead Revision/Repair;  Surgeon: Evans Lance, MD;  Location: [facility] [facility] CV LAB;  Service: Cardiovascular;  Laterality: N/A;  . LEFT HEART CATHETERIZATION WITH CORONARY ANGIOGRAM N/A 05/27/2014   Procedure: LEFT HEART CATHETERIZATION WITH CORONARY ANGIOGRAM;  Surgeon: Sherren Mocha, MD;  Location: [facility] CATH LAB;  Service: Cardiovascular;  Laterality: N/A;  . REPAIR KNEE LIGAMENT      ROS- all systems are reviewed and negative except as per HPI above  Current Outpatient Medications  Medication Sig Dispense Refill  . acetaminophen (TYLENOL) 500 MG tablet Take 500-1,000 mg by mouth every 8 (eight) hours as needed (for pain).     Marland Kitchen amiodarone (PACERONE) 200 MG tablet Take 2 tabs (400 mg) twice daily for 2 weeks, then 2 tabs (400 mg) once daily for 2 weeks, then 1 tab (200 mg) once daily. 120 tablet 5  . atorvastatin (LIPITOR) 80 MG tablet TAKE 1 TABLET BY MOUTH  DAILY AT 6 PM (Patient taking differently: Take 80 mg by mouth every evening.) 90 tablet 3  . carvedilol (COREG) 25 MG tablet Take 1 tablet (25 mg total) by mouth 2 (two) times daily with a meal. 180 tablet 3  . feeding supplement (ENSURE ENLIVE / ENSURE PLUS) LIQD Try to drink 1 can 1-2 times a day.    . Fluticasone-Salmeterol (ADVAIR) 100-50 MCG/DOSE AEPB Inhale 1 puff into the lungs 2 (two) times daily. 1 each 0  . furosemide (LASIX) 40 MG  tablet Take 0.5 tablets (20 mg total) by mouth daily. 45 tablet 3  . isosorbide mononitrate (IMDUR) 30 MG 24 hr tablet Take 0.5 tablets (15 mg total) by mouth daily. 45 tablet 3  . Multiple Vitamin (MULTIVITAMIN) capsule Take 1 capsule by mouth daily.    . nitroGLYCERIN (NITROSTAT) 0.4 MG SL tablet Place 1 tablet (0.4 mg total) under the tongue every 5 (five) minutes x 3 doses as needed for chest pain. 25 tablet 12  . Omega-3 Fatty Acids (FISH OIL) 1200 MG CAPS Take 1,200 mg by mouth daily.    . potassium chloride SA (KLOR-CON) 20 MEQ tablet TAKE 1 TABLET BY MOUTH  DAILY  (Patient taking differently: Take 20 mEq by mouth daily.) 90 tablet 3  . rivaroxaban (XARELTO) 20 MG TABS tablet Take 1 tablet (20 mg total) by mouth daily with supper. 90 tablet 3  . sacubitril-valsartan (ENTRESTO) 49-51 MG Take 1 tablet by mouth 2 (two) times daily. 60 tablet 12  . spironolactone (ALDACTONE) 25 MG tablet Take 1 tablet (25 mg total) by mouth at bedtime. 90 tablet 3  . hydrALAZINE (APRESOLINE) 25 MG tablet Take 1 tablet (25 mg total) by mouth 3 (three) times daily. 270 tablet 3   No current facility-administered medications for this visit.    Physical Exam: Vitals:   07/12/20 0935  BP: (!) 92/54  Pulse: 60  SpO2: 98%  Weight: 140 lb (63.5 kg)  Height: 5\' 10"  (1.778 m)    GEN- The patient is well appearing, alert and oriented x 3 today.   Head- normocephalic, atraumatic Eyes-  Sclera clear, conjunctiva pink Ears- hearing intact Oropharynx- clear Lungs- Clear to ausculation bilaterally, normal work of breathing Chest- ICD pocket is well healed Heart- Regular rate and rhythm, no murmurs, rubs or gallops, PMI not laterally displaced GI- soft, NT, ND, + BS Extremities- no clubbing, cyanosis, or edema  ICD interrogation- reviewed in detail today,  See PACEART report  ekg tracing ordered today is personally reviewed and shows atrial pacing, first degree AV block, RBBB  Wt Readings from Last 3 Encounters:  07/12/20 140 lb (63.5 kg)  06/04/20 141 lb 5 oz (64.1 kg)  03/31/20 143 lb (64.9 kg)    Assessment and Plan:  1.  Chronic systolic dysfunction/ ischemic CM euvolemic today Doing fairly poorly with NYHA Class III symptoms despite good medical therapy Low BP limits medical options Could consider SGLT2 inhibitor Normal ICD function See P[facility] Art report No changes today he is not device dependant today followed in ICM device clinic We discussed BAT therapy today Check pro BNP Follow-up with EP APP in 3 months to further discuss  2. HTN Stable No change  required today bmet today  3. afib Burden <1% Continue xarelto for chads2vasc score of 5  4. VT He had VT last on 4/22 Now on amiodarone We will need to follow him closely on this medicine to avoid toxicity Currently controlled Normal ICD function Check LFTs, TFTS today (4/22 labs reviewed)  Risks, benefits and potential toxicities for medications prescribed and/or refilled reviewed with patient today.   Return to see EP PA in 3 months  Thompson Grayer MD, [facility] 07/12/2020 9:48 AM

## 2020-07-13 LAB — BASIC METABOLIC PANEL
BUN/Creatinine Ratio: 8 — ABNORMAL LOW (ref 10–24)
BUN: 10 mg/dL (ref 8–27)
CO2: 25 mmol/L (ref 20–29)
Calcium: 9.6 mg/dL (ref 8.6–10.2)
Chloride: 98 mmol/L (ref 96–106)
Creatinine, Ser: 1.23 mg/dL (ref 0.76–1.27)
Glucose: 106 mg/dL — ABNORMAL HIGH (ref 65–99)
Potassium: 4.5 mmol/L (ref 3.5–5.2)
Sodium: 137 mmol/L (ref 134–144)
eGFR: 61 mL/min/{1.73_m2} (ref >59)

## 2020-07-13 LAB — CBC
Hematocrit: 41 % (ref 37.5–51.0)
Hemoglobin: 13.7 g/dL (ref 13.0–17.7)
MCH: 31.3 pg (ref 26.6–33.0)
MCHC: 33.4 g/dL (ref 31.5–35.7)
MCV: 94 fL (ref 79–97)
Platelets: 242 10*3/uL (ref 150–450)
RBC: 4.38 x10E6/uL (ref 4.14–5.80)
RDW: 13.9 % (ref 11.6–15.4)
WBC: 7.4 10*3/uL (ref 3.4–10.8)

## 2020-07-13 LAB — HEPATIC FUNCTION PANEL
ALT: 29 IU/L (ref 0–44)
AST: 29 IU/L (ref 0–40)
Albumin: 4.1 g/dL (ref 3.7–4.7)
Alkaline Phosphatase: 74 IU/L (ref 44–121)
Bilirubin Total: 0.6 mg/dL (ref 0.0–1.2)
Bilirubin, Direct: 0.16 mg/dL (ref 0.00–0.40)
Total Protein: 7.3 g/dL (ref 6.0–8.5)

## 2020-07-13 LAB — PRO B NATRIURETIC PEPTIDE: NT-Pro BNP: 309 pg/mL (ref 0–486)

## 2020-07-13 LAB — TSH: TSH: 0.96 u[IU]/mL (ref 0.450–4.500)

## 2020-07-26 ENCOUNTER — Ambulatory Visit (INDEPENDENT_AMBULATORY_CARE_PROVIDER_SITE_OTHER): Payer: Medicare (Managed Care)

## 2020-07-26 DIAGNOSIS — I255 Ischemic cardiomyopathy: Secondary | ICD-10-CM | POA: Diagnosis not present

## 2020-07-26 LAB — CUP PACEART REMOTE DEVICE CHECK
Battery Remaining Longevity: 23 mo
Battery Remaining Percentage: 24 %
Battery Voltage: 2.93 V
Brady Statistic AP VP Percent: 1 %
Brady Statistic AP VS Percent: 46 %
Brady Statistic AS VP Percent: 1 %
Brady Statistic AS VS Percent: 53 %
Brady Statistic RA Percent Paced: 46 %
Brady Statistic RV Percent Paced: 1.1 %
Date Time Interrogation Session: 20220622052605
HighPow Impedance: 64 Ohm
HighPow Impedance: 64 Ohm
Implantable Lead Implant Date: 20120420
Implantable Lead Implant Date: 20180613
Implantable Lead Location: 753859
Implantable Lead Location: 753860
Implantable Pulse Generator Implant Date: 20160212
Lead Channel Impedance Value: 340 Ohm
Lead Channel Impedance Value: 550 Ohm
Lead Channel Pacing Threshold Amplitude: 0.5 V
Lead Channel Pacing Threshold Amplitude: 0.5 V
Lead Channel Pacing Threshold Pulse Width: 0.5 ms
Lead Channel Pacing Threshold Pulse Width: 0.5 ms
Lead Channel Sensing Intrinsic Amplitude: 11.6 mV
Lead Channel Sensing Intrinsic Amplitude: 2.5 mV
Lead Channel Setting Pacing Amplitude: 2 V
Lead Channel Setting Pacing Amplitude: 2.5 V
Lead Channel Setting Pacing Pulse Width: 0.5 ms
Lead Channel Setting Sensing Sensitivity: 0.5 mV
Pulse Gen Serial Number: 7225079

## 2020-07-28 ENCOUNTER — Telehealth: Payer: Self-pay | Admitting: Cardiology

## 2020-07-28 NOTE — Telephone Encounter (Signed)
Patient calling the office for samples of medication:   1.  What medication and dosage are you requesting samples for? Entresto and xarelto   2.  Are you currently out of this medication? Yes

## 2020-07-28 NOTE — Telephone Encounter (Signed)
Called patient's wife left message on personal voice mail office out of Entresto and Xarelto samples.Advised to call back if patient needs to apply for patient assistance.

## 2020-07-28 NOTE — Telephone Encounter (Signed)
PT is returning a call 

## 2020-07-28 NOTE — Telephone Encounter (Signed)
Wife updated that no samples are available for Entresto or Xarelto. Wife verbalized understanding.

## 2020-08-14 ENCOUNTER — Ambulatory Visit (INDEPENDENT_AMBULATORY_CARE_PROVIDER_SITE_OTHER): Payer: Medicare (Managed Care)

## 2020-08-14 DIAGNOSIS — Z95810 Presence of automatic (implantable) cardiac defibrillator: Secondary | ICD-10-CM | POA: Diagnosis not present

## 2020-08-14 DIAGNOSIS — I5022 Chronic systolic (congestive) heart failure: Secondary | ICD-10-CM | POA: Diagnosis not present

## 2020-08-15 NOTE — Progress Notes (Signed)
EPIC Encounter for ICM Monitoring  Patient Name: Benjamin Cook is a 75 y.o. male Date: 08/15/2020 Primary Care Physican: Patient, No Pcp Per (Inactive) Primary Cardiologist: Crenshaw Electrophysiologist: Allred 08/15/2020 Weight: 140 lbs     Spoke with wife per DPR and heart failure questions reviewed.  Pt asymptomatic for fluid accumulation and feeing well.    CorVue thoracic impedance suggesting possible fluid accumulation starting 08/12/2020.      Prescribed:  Furosemide 40 mg 0.5 tablet (20 mg total) daily.   Potassium 20 mEq take 1 tablet daily. Spironolactone 25 mg take 1 tablet daily   Labs: 07/12/2020 Creatinine 1.23, BUN 10, Potassium 4.5, Sodium 137, GFR 61 06/04/2020 Creatinine 0.84, BUN 9,   Potassium 4.4, Sodium 135, GFR >60 06/03/2020 Creatinine 0.92, BUN 10, Potassium 4.0, Sodium 137, GFR >60 06/02/2020 Creatinine 0.89, BUN 6,   Potassium 4.4, Sodium 136, GFR >60 06/01/2020 Creatinine 1.01, BUN 6,   Potassium 3.2, Sodium 135, GFR >60 03/17/2020 Creatinine 0.90, BUN 5,   Potassium 3.7, Sodium 139  A complete set of results can be found in Results Review.   Recommendations:  Advised to take Furosemide 40 mg x 1 on 7/13 and then return to 20 mg daily.  Advised to limit salt intake.   Follow-up plan: ICM clinic phone appointment on 08/23/2020 to recheck fluid levels.   91 day device clinic remote transmission 10/25/2020.     EP/Cardiology Office Visits:  Recall 09/13/2020 with Dr. [name].  10/20/2020 with Oda Kilts, PA.   Copy of ICM check sent to Dr. Rayann Heman.      3 month ICM trend: 08/14/2020.    1 Year ICM trend:       [name], RN 08/15/2020 8:48 AM

## 2020-08-15 NOTE — Progress Notes (Signed)
Remote ICD transmission.   

## 2020-08-23 ENCOUNTER — Ambulatory Visit (INDEPENDENT_AMBULATORY_CARE_PROVIDER_SITE_OTHER): Payer: Medicare (Managed Care)

## 2020-08-23 DIAGNOSIS — Z95810 Presence of automatic (implantable) cardiac defibrillator: Secondary | ICD-10-CM

## 2020-08-23 DIAGNOSIS — I5022 Chronic systolic (congestive) heart failure: Secondary | ICD-10-CM

## 2020-08-28 NOTE — Progress Notes (Signed)
EPIC Encounter for ICM Monitoring  Patient Name: Benjamin Cook is a 75 y.o. male Date: 08/28/2020 Primary Care Physican: Patient, No Pcp Per (Inactive) Primary Cardiologist: [name] Electrophysiologist: Allred 08/15/2020 Weight: 140 lbs     Transmission reviewed.   CorVue thoracic impedance suggesting possible fluid accumulation starting 08/12/2020 and trending back close baseline after taking Furosemide 40 mg x 1 day      Prescribed:  Furosemide 40 mg 0.5 tablet (20 mg total) daily.   Potassium 20 mEq take 1 tablet daily. Spironolactone 25 mg take 1 tablet daily   Labs: 07/12/2020 Creatinine 1.23, BUN 10, Potassium 4.5, Sodium 137, GFR 61 06/04/2020 Creatinine 0.84, BUN 9,   Potassium 4.4, Sodium 135, GFR >60 06/03/2020 Creatinine 0.92, BUN 10, Potassium 4.0, Sodium 137, GFR >60 06/02/2020 Creatinine 0.89, BUN 6,   Potassium 4.4, Sodium 136, GFR >60 06/01/2020 Creatinine 1.01, BUN 6,   Potassium 3.2, Sodium 135, GFR >60 03/17/2020 Creatinine 0.90, BUN 5,   Potassium 3.7, Sodium 139  A complete set of results can be found in Results Review.   Recommendations:  No changes.   Follow-up plan: ICM clinic phone appointment on 09/25/2020.   91 day device clinic remote transmission 10/25/2020.     EP/Cardiology Office Visits:  Recall 09/13/2020 with Dr. [name].  10/20/2020 with Oda Kilts, PA.   Copy of ICM check sent to Dr. Rayann Heman.       3 month ICM trend: 08/23/2020.    1 Year ICM trend:       [name], RN 08/28/2020 11:33 AM

## 2020-08-30 ENCOUNTER — Telehealth: Payer: Self-pay | Admitting: Cardiology

## 2020-08-30 NOTE — Telephone Encounter (Signed)
Let message for rachel, will take care of faxing patient assistance forms to company for entresto.

## 2020-08-30 NOTE — Telephone Encounter (Signed)
Will route to nurse as a FYI for paperwork.   Thank you!

## 2020-08-30 NOTE — Telephone Encounter (Signed)
     Pt c/o medication issue:  1. Name of Medication:   sacubitril-valsartan (ENTRESTO) 49-51 MG    2. How are you currently taking this medication (dosage and times per day)? Take 1 tablet by mouth 2 (two) times daily.  3. Are you having a reaction (difficulty breathing--STAT)?   4. What is your medication issue? Apolonio Schneiders - prescription [employer], she wanted to let Dr. [name] know that the pt is dropping off papers for pt assistance for entresto, once Dr. [name] signed it to give it back to the pt, so pt can send it to them. Apolonio Schneiders also asked if they can mail the prescription to [place] instead to the pt, she said that's what the pt requested

## 2020-09-06 NOTE — Telephone Encounter (Signed)
Spoke with pt wife, the paperwork has not been dropped off yet.

## 2020-09-25 ENCOUNTER — Ambulatory Visit (INDEPENDENT_AMBULATORY_CARE_PROVIDER_SITE_OTHER): Payer: Medicare (Managed Care)

## 2020-09-25 DIAGNOSIS — I5022 Chronic systolic (congestive) heart failure: Secondary | ICD-10-CM

## 2020-09-25 DIAGNOSIS — Z95810 Presence of automatic (implantable) cardiac defibrillator: Secondary | ICD-10-CM | POA: Diagnosis not present

## 2020-09-27 NOTE — Progress Notes (Signed)
EPIC Encounter for ICM Monitoring  Patient Name: Benjamin Cook is a 75 y.o. male Date: 09/27/2020 Primary Care Physican: Patient, No Pcp Per (Inactive) Primary Cardiologist: [name] Electrophysiologist: Allred 08/15/2020 Weight: 140 lbs     Transmission reviewed.   CorVue thoracic impedance suggesting normal fluid levels.    Prescribed:  Furosemide 40 mg 0.5 tablet (20 mg total) daily.   Potassium 20 mEq take 1 tablet daily. Spironolactone 25 mg take 1 tablet daily   Labs: 07/12/2020 Creatinine 1.23, BUN 10, Potassium 4.5, Sodium 137, GFR 61 06/04/2020 Creatinine 0.84, BUN 9,   Potassium 4.4, Sodium 135, GFR >60 06/03/2020 Creatinine 0.92, BUN 10, Potassium 4.0, Sodium 137, GFR >60 06/02/2020 Creatinine 0.89, BUN 6,   Potassium 4.4, Sodium 136, GFR >60 06/01/2020 Creatinine 1.01, BUN 6,   Potassium 3.2, Sodium 135, GFR >60 03/17/2020 Creatinine 0.90, BUN 5,   Potassium 3.7, Sodium 139  A complete set of results can be found in Results Review.   Recommendations:  No changes.   Follow-up plan: ICM clinic phone appointment on 11/06/2020.   91 day device clinic remote transmission 10/25/2020.     EP/Cardiology Office Visits:  Recall 09/13/2020 with Dr. [name].  10/20/2020 with Oda Kilts, PA.   Copy of ICM check sent to Dr. Rayann Heman.     3 month ICM trend: 09/25/2020.    1 Year ICM trend:       [name], RN 09/27/2020 4:27 PM

## 2020-10-04 ENCOUNTER — Telehealth: Payer: Self-pay | Admitting: *Deleted

## 2020-10-04 NOTE — Telephone Encounter (Signed)
Patient has been approved for patient assistance for entresto.

## 2020-10-10 ENCOUNTER — Other Ambulatory Visit: Payer: Self-pay

## 2020-10-10 ENCOUNTER — Emergency Department (HOSPITAL_COMMUNITY): Payer: Medicare (Managed Care)

## 2020-10-10 ENCOUNTER — Telehealth: Payer: Self-pay | Admitting: Cardiology

## 2020-10-10 ENCOUNTER — Inpatient Hospital Stay (HOSPITAL_COMMUNITY)
Admission: EM | Admit: 2020-10-10 | Discharge: 2020-10-16 | DRG: 287 | Disposition: A | Discharge status: Home / Self Care | Payer: Medicare (Managed Care) | Attending: Internal Medicine | Admitting: Internal Medicine

## 2020-10-10 DIAGNOSIS — I5020 Unspecified systolic (congestive) heart failure: Secondary | ICD-10-CM | POA: Diagnosis present

## 2020-10-10 DIAGNOSIS — J449 Chronic obstructive pulmonary disease, unspecified: Secondary | ICD-10-CM | POA: Diagnosis present

## 2020-10-10 DIAGNOSIS — Z955 Presence of coronary angioplasty implant and graft: Secondary | ICD-10-CM

## 2020-10-10 DIAGNOSIS — K5730 Diverticulosis of large intestine without perforation or abscess without bleeding: Secondary | ICD-10-CM | POA: Diagnosis present

## 2020-10-10 DIAGNOSIS — D3500 Benign neoplasm of unspecified adrenal gland: Secondary | ICD-10-CM | POA: Diagnosis present

## 2020-10-10 DIAGNOSIS — Z7901 Long term (current) use of anticoagulants: Secondary | ICD-10-CM

## 2020-10-10 DIAGNOSIS — R109 Unspecified abdominal pain: Secondary | ICD-10-CM | POA: Diagnosis present

## 2020-10-10 DIAGNOSIS — I509 Heart failure, unspecified: Secondary | ICD-10-CM

## 2020-10-10 DIAGNOSIS — R110 Nausea: Secondary | ICD-10-CM | POA: Diagnosis not present

## 2020-10-10 DIAGNOSIS — D649 Anemia, unspecified: Secondary | ICD-10-CM | POA: Diagnosis present

## 2020-10-10 DIAGNOSIS — Z8679 Personal history of other diseases of the circulatory system: Secondary | ICD-10-CM

## 2020-10-10 DIAGNOSIS — I480 Paroxysmal atrial fibrillation: Secondary | ICD-10-CM

## 2020-10-10 DIAGNOSIS — I255 Ischemic cardiomyopathy: Secondary | ICD-10-CM | POA: Diagnosis present

## 2020-10-10 DIAGNOSIS — I2584 Coronary atherosclerosis due to calcified coronary lesion: Secondary | ICD-10-CM | POA: Diagnosis present

## 2020-10-10 DIAGNOSIS — I739 Peripheral vascular disease, unspecified: Secondary | ICD-10-CM | POA: Diagnosis present

## 2020-10-10 DIAGNOSIS — E785 Hyperlipidemia, unspecified: Secondary | ICD-10-CM | POA: Diagnosis present

## 2020-10-10 DIAGNOSIS — Z7189 Other specified counseling: Secondary | ICD-10-CM

## 2020-10-10 DIAGNOSIS — N400 Enlarged prostate without lower urinary tract symptoms: Secondary | ICD-10-CM | POA: Diagnosis present

## 2020-10-10 DIAGNOSIS — Z79899 Other long term (current) drug therapy: Secondary | ICD-10-CM

## 2020-10-10 DIAGNOSIS — I10 Essential (primary) hypertension: Secondary | ICD-10-CM | POA: Diagnosis present

## 2020-10-10 DIAGNOSIS — F17290 Nicotine dependence, other tobacco product, uncomplicated: Secondary | ICD-10-CM | POA: Diagnosis present

## 2020-10-10 DIAGNOSIS — L4050 Arthropathic psoriasis, unspecified: Secondary | ICD-10-CM | POA: Diagnosis present

## 2020-10-10 DIAGNOSIS — I110 Hypertensive heart disease with heart failure: Secondary | ICD-10-CM | POA: Diagnosis present

## 2020-10-10 DIAGNOSIS — R1084 Generalized abdominal pain: Secondary | ICD-10-CM

## 2020-10-10 DIAGNOSIS — I2510 Atherosclerotic heart disease of native coronary artery without angina pectoris: Secondary | ICD-10-CM | POA: Diagnosis present

## 2020-10-10 DIAGNOSIS — Z20822 Contact with and (suspected) exposure to covid-19: Secondary | ICD-10-CM | POA: Diagnosis present

## 2020-10-10 DIAGNOSIS — I472 Ventricular tachycardia, unspecified: Secondary | ICD-10-CM

## 2020-10-10 DIAGNOSIS — I714 Abdominal aortic aneurysm, without rupture, unspecified: Secondary | ICD-10-CM | POA: Diagnosis present

## 2020-10-10 DIAGNOSIS — M1990 Unspecified osteoarthritis, unspecified site: Secondary | ICD-10-CM | POA: Diagnosis present

## 2020-10-10 DIAGNOSIS — Z95810 Presence of automatic (implantable) cardiac defibrillator: Secondary | ICD-10-CM

## 2020-10-10 DIAGNOSIS — I5023 Acute on chronic systolic (congestive) heart failure: Secondary | ICD-10-CM | POA: Diagnosis present

## 2020-10-10 DIAGNOSIS — I4510 Unspecified right bundle-branch block: Secondary | ICD-10-CM | POA: Diagnosis present

## 2020-10-10 DIAGNOSIS — Z811 Family history of alcohol abuse and dependence: Secondary | ICD-10-CM

## 2020-10-10 DIAGNOSIS — Z7984 Long term (current) use of oral hypoglycemic drugs: Secondary | ICD-10-CM

## 2020-10-10 DIAGNOSIS — I5022 Chronic systolic (congestive) heart failure: Secondary | ICD-10-CM | POA: Diagnosis present

## 2020-10-10 LAB — COMPREHENSIVE METABOLIC PANEL
ALT: 77 U/L — ABNORMAL HIGH (ref 0–44)
AST: 58 U/L — ABNORMAL HIGH (ref 15–41)
Albumin: 3.4 g/dL — ABNORMAL LOW (ref 3.5–5.0)
Alkaline Phosphatase: 70 U/L (ref 38–126)
Anion gap: 8 (ref 5–15)
BUN: 13 mg/dL (ref 8–23)
CO2: 26 mmol/L (ref 22–32)
Calcium: 9.3 mg/dL (ref 8.9–10.3)
Chloride: 103 mmol/L (ref 98–111)
Creatinine, Ser: 1.19 mg/dL (ref 0.61–1.24)
GFR, Estimated: >60 mL/min (ref >60)
Glucose, Bld: 104 mg/dL — ABNORMAL HIGH (ref 70–99)
Potassium: 4.3 mmol/L (ref 3.5–5.1)
Sodium: 137 mmol/L (ref 135–145)
Total Bilirubin: 0.7 mg/dL (ref 0.3–1.2)
Total Protein: 7.1 g/dL (ref 6.5–8.1)

## 2020-10-10 LAB — CBC WITH DIFFERENTIAL/PLATELET
Abs Immature Granulocytes: 0.05 10*3/uL (ref 0.00–0.07)
Basophils Absolute: 0.1 10*3/uL (ref 0.0–0.1)
Basophils Relative: 1 %
Eosinophils Absolute: 0.3 10*3/uL (ref 0.0–0.5)
Eosinophils Relative: 2 %
HCT: 39.5 % (ref 39.0–52.0)
Hemoglobin: 12.9 g/dL — ABNORMAL LOW (ref 13.0–17.0)
Immature Granulocytes: 0 %
Lymphocytes Relative: 13 %
Lymphs Abs: 1.5 10*3/uL (ref 0.7–4.0)
MCH: 31.5 pg (ref 26.0–34.0)
MCHC: 32.7 g/dL (ref 30.0–36.0)
MCV: 96.3 fL (ref 80.0–100.0)
Monocytes Absolute: 1.3 10*3/uL — ABNORMAL HIGH (ref 0.1–1.0)
Monocytes Relative: 12 %
Neutro Abs: 8.5 10*3/uL — ABNORMAL HIGH (ref 1.7–7.7)
Neutrophils Relative %: 72 %
Platelets: 260 10*3/uL (ref 150–400)
RBC: 4.1 MIL/uL — ABNORMAL LOW (ref 4.22–5.81)
RDW: 15.6 % — ABNORMAL HIGH (ref 11.5–15.5)
WBC: 11.7 10*3/uL — ABNORMAL HIGH (ref 4.0–10.5)
nRBC: 0 % (ref 0.0–0.2)

## 2020-10-10 LAB — RESP PANEL BY RT-PCR (FLU A&B, COVID) ARPGX2
Influenza A by PCR: NEGATIVE
Influenza B by PCR: NEGATIVE
SARS Coronavirus 2 by RT PCR: NEGATIVE

## 2020-10-10 LAB — LIPASE, BLOOD: Lipase: 33 U/L (ref 11–51)

## 2020-10-10 MED ORDER — IOHEXOL 350 MG/ML SOLN
80.0000 mL | Freq: Once | INTRAVENOUS | Status: AC | PRN
  Administered 2020-10-10: 80 mL via INTRAVENOUS

## 2020-10-10 MED ORDER — FENTANYL CITRATE PF 50 MCG/ML IJ SOSY
100.0000 ug | PREFILLED_SYRINGE | Freq: Once | INTRAMUSCULAR | Status: DC
Start: 2020-10-10 — End: 2020-10-11
  Filled 2020-10-10: qty 2

## 2020-10-10 MED ORDER — MIDAZOLAM HCL 2 MG/2ML IJ SOLN
2.0000 mg | Freq: Once | INTRAMUSCULAR | Status: AC
  Administered 2020-10-10: 2 mg via INTRAVENOUS

## 2020-10-10 MED ORDER — FENTANYL CITRATE PF 50 MCG/ML IJ SOSY
100.0000 ug | PREFILLED_SYRINGE | Freq: Once | INTRAMUSCULAR | Status: AC
  Administered 2020-10-10: 50 ug via INTRAVENOUS

## 2020-10-10 MED ORDER — AMIODARONE HCL IN DEXTROSE 360-4.14 MG/200ML-% IV SOLN
60.0000 mg/h | INTRAVENOUS | Status: DC
  Administered 2020-10-10 (×2): 60 mg/h via INTRAVENOUS
  Filled 2020-10-10 (×2): qty 200

## 2020-10-10 MED ORDER — AMIODARONE LOAD VIA INFUSION
150.0000 mg | Freq: Once | INTRAVENOUS | Status: AC
  Administered 2020-10-10: 150 mg via INTRAVENOUS
  Filled 2020-10-10: qty 83.34

## 2020-10-10 MED ORDER — AMIODARONE IV BOLUS ONLY 150 MG/100ML
150.0000 mg | Freq: Once | INTRAVENOUS | Status: AC
  Administered 2020-10-10: 150 mg via INTRAVENOUS

## 2020-10-10 MED ORDER — MIDAZOLAM HCL 2 MG/2ML IJ SOLN
2.0000 mg | Freq: Once | INTRAMUSCULAR | Status: DC
  Filled 2020-10-10: qty 2

## 2020-10-10 MED ORDER — AMIODARONE HCL IN DEXTROSE 360-4.14 MG/200ML-% IV SOLN
60.0000 mg/h | INTRAVENOUS | Status: DC
  Administered 2020-10-11: 30 mg/h via INTRAVENOUS
  Administered 2020-10-11 (×2): 60 mg/h via INTRAVENOUS
  Administered 2020-10-11: 30 mg/h via INTRAVENOUS
  Administered 2020-10-11: 60 mg/h via INTRAVENOUS
  Filled 2020-10-10: qty 200
  Filled 2020-10-10: qty 400
  Filled 2020-10-10 (×3): qty 200

## 2020-10-10 NOTE — ED Notes (Signed)
Pt placed on Room Air to see how he maintain transporting to new room.

## 2020-10-10 NOTE — ED Notes (Signed)
Defibrillator is being interrogated at the bedside with technician.

## 2020-10-10 NOTE — ED Notes (Signed)
Cardiologist, [place], resident, pharmacist, and RN at the bedside to complete an Internal cardioversion with pt ICD.

## 2020-10-10 NOTE — Telephone Encounter (Signed)
Pt c/o medication issue:  1. Name of Medication: spironolactone (ALDACTONE) 25 MG tablet furosemide (LASIX) 40 MG tablet rivaroxaban (XARELTO) 20 MG TABS tablet potassium chloride SA (KLOR-CON) 20 MEQ tablet Omega-3 Fatty Acids (FISH OIL) 1200 MG CAPSfishoil atorvastatin (LIPITOR) 80 MG tablet hydrALAZINE (APRESOLINE) 25 MG tablet hydrala carvedilol (COREG) 25 MG tabletcarvedilol amiodarone (PACERONE) 200 MG tablet  2. How are you currently taking this medication (dosage and times per day)?  As prescribed  3. Are you having a reaction (difficulty breathing--STAT)?  See below   4. What is your medication issue?  Patient's wife states the patient has been extremely sick and hasn't been able to keep any food down.  She states whenever he takes medication he get hot then cold and becomes really sick. Sometimes it happens in the morning and sometimes it happens in the evening.

## 2020-10-10 NOTE — ED Provider Notes (Signed)
Emergency Medicine Provider Triage Evaluation Note  Benjamin Cook , a 75 y.o. male  was evaluated in triage.  Pt complains of lower abdominal pain, nausea, vomiting.  Patient reports that symptoms have been intermittent over the last month.  Symptoms are worse after eating.  Patient reports that pain became worse today.  Pain is located to lower abdomen.  Patient endorses nausea and vomiting.  Has vomited twice in the last 24 hours.  Emesis described as stomach contents.  Review of Systems  Positive: Abdominal pain, nausea, vomiting, chills, diarrhea Negative: Constipation, melena, blood in stool, dysuria, hematuria, urinary frequency.  Physical Exam  BP 94/76 (BP Location: Left Arm)   Pulse (!) 147   Temp (!) 97.5 F (36.4 C) (Oral)   Resp (!) 22   Ht '5\' 10"'$  (1.778 m)   Wt 63 kg   SpO2 97%   BMI 19.93 kg/m  Gen:   Awake, no distress   Resp:  Normal effort  MSK:   Moves extremities without difficulty  Other:  Abdomen soft, nondistended, tenderness to lower abdomen.  Medical Decision Making  Medically screening exam initiated at 8:15 PM.  Appropriate orders placed.  [name] was informed that the remainder of the evaluation will be completed by another provider, this initial triage assessment does not replace that evaluation, and the importance of remaining in the ED until their evaluation is complete.  Due to patient's tachycardia will obtain EKG.  EKG was shown to physician, patient moved back to room.    Dyann Ruddle 10/10/20 2040    Margette Fast, MD 10/10/20 [phone]

## 2020-10-10 NOTE — Consult Note (Signed)
Cardiology Consult    Patient ID: Benjamin Cook MRN: [id number], DOB/AGE: [birth year]   Admit date: 10/10/2020 Date of Consult: 10/10/2020 Requesting Provider: Nanda Quinton, MD  PCP:  Patient, No Pcp Per (Inactive)   [facility] Providers Cardiologist:  Kirk Ruths, MD  Electrophysiologist:  Thompson Grayer, MD  Electrophysiology APP:  Patsey Berthold, NP  {   Patient Profile    Benjamin Cook is a 75 y.o. male with a history of chronic HFrEF due to ICM s/p PCI of LAD in 2001 and ICD, VT with prior ICD shocks and VT storm 05/2020 (on amiodarone), paroxysmal AF, HTN, HLD, AAA s/p EVAR, COPD, and moderate malnutrition.   Device information SJM dual chamber ICD,  Implanted 2012, gen change 03/18/2014 Has an abandoned RV lead (fracture), new lead placed 2018 Known A lead noise   04/21/2019 @ 9:31PM VT episode. This was a true MMVT, 203bpm, one ATP delivered failed, one HV therapy, 25J with clean break. ATP therapies were added 10/30/19: VT w/HV tx (pt preferred to hold off AAD), stress with only peri-infarct ischemia, not felt to need cath 06/01/20: VT storm with multiple ATP and ICD shocks, also noted to have a VT slower than VT zone. A second VT zone was added at 166 bmp. Loaded with amiodarone and continued on '200mg'$  daily. Not felt to need cath given lack of chest pain.   AADs: Amiodarone '200mg'$  daily, started 06/01/2020  History of Present Illness    For the past month he has noted recurrent episodes of abdominal pain and nausea that typically last for several hours, occasionally with vomiting and typically worse after eating. The episodes were initially occurring sporadically, but have been daily for the last 3 days. More recently, they are proceeded by a few minutes of chest discomfort and dyspnea, though his report of this has been inconsistent. However, he denies feeling lightheaded or palpitations with this and denies recent syncope.   ECG checked due to tachycardia that showed  WCT that on personal review is consistent with VT. His symptoms are currently resolving despite having ongoing ventricular tachycardia. He had received an amiodarone '150mg'$  bolus without effect. Attempted ATP at 458m which failed to terminate the arrhythmia. He was sedated and cardioverted with his ICD at 40J on first attempt.   Labs notable for new elevation in transaminases, BMP otherwise normal. Mild leukocytosis, covid negative. CXR findings c/w COPD, no evidence of lead fractures.    Interrogation Battery and lead measurements are stable No tachy episodes detected/treated   Past Medical History   Past Medical History:  Diagnosis Date   Adrenal mass ([facility]    per pt this is remote (10 years) and benign by biopsy   AICD (automatic cardioverter/defibrillator) present    Anxiety    Arthritis    CAD (coronary artery disease)    a. s/p PCI to LAD 2001 b. myoview 2014 high risk with scar LAD/RCA territory but no ischemia   Cardiomyopathy, ischemic    CHF (congestive heart failure) ([name])    class II/III   COPD (chronic obstructive pulmonary disease) ([place]    smokes cigars but has quit cigarettes   Defibrillator discharge 04/21/2019   Diverticulitis    Dyspnea    Flu 03/2015   HTN (hypertension)    Hyperlipidemia    NSVT (nonsustained ventricular tachycardia) ([facility] 03/05/2017   Paroxysmal atrial fibrillation ([place] 03/2015   chads2vasc score is 4   PSA (psoriatic arthritis) ([facility]    increased  PVD (peripheral vascular disease) ([place])     Past Surgical History:  Procedure Laterality Date   ABDOMINAL AORTIC ENDOVASCULAR STENT GRAFT N/A 03/03/2017   Procedure: ABDOMINAL AORTIC ENDOVASCULAR STENT GRAFT;  Surgeon: Waynetta Sandy, MD;  Location: [facility];  Service: Vascular;  Laterality: N/A;   cataract surgery     CORONARY ANGIOPLASTY WITH STENT PLACEMENT  2001   a. PCI to LAD   IMPLANTABLE CARDIOVERTER DEFIBRILLATOR (ICD) GENERATOR CHANGE N/A 03/18/2014   a. SJM Fortify ST DR  ICD implanted by [facility] for primary prevention b. gen change 03/2014    LEAD REVISION/REPAIR N/A 07/17/2016   Procedure: Lead Revision/Repair;  Surgeon: Evans Lance, MD;  Location: [facility];  Service: Cardiovascular;  Laterality: N/A;   LEFT HEART CATHETERIZATION WITH CORONARY ANGIOGRAM N/A 05/27/2014   Procedure: LEFT HEART CATHETERIZATION WITH CORONARY ANGIOGRAM;  Surgeon: Sherren Mocha, MD;  Location: [facility] C[facility];  Service: Cardiovascular;  Laterality: N/A;   REPAIR KNEE LIGAMENT       No Known Allergies Inpatient Medications     fentaNYL (SUBLIMAZE) injection  100 mcg Intravenous Once   midazolam  2 mg Intravenous Once    Family History    Family History  Problem Relation Age of Onset   Cirrhosis Mother        due to ETOH   Cancer Neg Hx    He indicated that his mother is deceased. He indicated that his father is deceased. He indicated that his maternal grandmother is deceased. He indicated that his maternal grandfather is deceased. He indicated that his paternal grandmother is deceased. He indicated that his paternal grandfather is deceased. He indicated that the status of his neg hx is unknown.   Social History    Social History   Socioeconomic History   Marital status: Married    Spouse name: Not on file   Number of children: Not on file   Years of education: Not on file   Highest education level: Not on file  Occupational History   Occupation: Lawn care  Tobacco Use   Smoking status: Every Day    Types: Cigars   Smokeless tobacco: Never   Tobacco comments:    smokes cigars now, no longer smokes cigarettes but previously smoked 2ppd  Vaping Use   Vaping Use: Never used  Substance and Sexual Activity   Alcohol use: No   Drug use: No   Sexual activity: Not Currently    Partners: Female    Birth control/protection: None  Other Topics Concern   Not on file  Social History Narrative   Not on file   Social Determinants of Health   Financial Resource  Strain: Not on file  Food Insecurity: Not on file  Transportation Needs: Not on file  Physical Activity: Not on file  Stress: Not on file  Social Connections: Not on file  Intimate Partner Violence: Not on file     Review of Systems    General:  No chills, fever, night sweats or weight changes.  Cardiovascular:  see HPI Dermatological: No rash, lesions/masses Respiratory: No cough, dyspnea Urologic: No hematuria, dysuria Abdominal:  + nausea, vomiting, loose stools. No right red blood per rectum, melena, or hematemesis Neurologic:  No visual changes, changes in mental status. +weakness at times All other systems reviewed and are otherwise negative except as noted above.  Physical Exam    Blood pressure (!) 117/59, pulse 66, temperature (!) 97.5 F (36.4 C), temperature source Oral, resp. rate Marland Kitchen)  21, height '5\' 10"'$  (1.778 m), weight 63 kg, SpO2 99 %.    No intake or output data in the 24 hours ending 10/10/20 2218 Wt Readings from Last 3 Encounters:  10/10/20 63 kg  07/12/20 63.5 kg  06/04/20 64.1 kg    CONSTITUTIONAL: alert and conversant, uncomfortable appearing HEENT: normal NECK: no JVD, no masses CARDIAC: Regular rhythm. Normal S1/S2, no S3/S4. No murmur appreciated. No friction rub. No JVD VASCULAR: Radial pulses intact bilaterally. No carotid bruits. PULMONARY/CHEST WALL: no deformities, normal breath sounds bilaterally, normal work of breathing ABDOMINAL: soft, non-tender, non-distended EXTREMITIES: no edema, no muscle atrophy, warm and well-perfused SKIN: Dry and intact without apparent rashes or wounds. No peripheral cyanosis. NEUROLOGIC: alert, no abnormal movements, cranial nerves grossly intact. PSYCH: normal affect, normal speech and language   Labs    Cardiac Panel (last 3 results) No results for input(s): CKTOTAL, CKMB, TROPONINIHS, RELINDX in the last 72 hours.  Lab Results  Component Value Date   WBC 11.7 (H) 10/10/2020   HGB 12.9 (L) 10/10/2020    HCT 39.5 10/10/2020   MCV 96.3 10/10/2020   PLT 260 10/10/2020    Recent Labs  Lab 10/10/20 2023  NA 137  K 4.3  CL 103  CO2 26  BUN 13  CREATININE 1.19  CALCIUM 9.3  PROT 7.1  BILITOT 0.7  ALKPHOS 70  ALT 77*  AST 58*  GLUCOSE 104*   Lab Results  Component Value Date   CHOL 97 (L) 03/17/2020   HDL 34 (L) 03/17/2020   LDLCALC 48 03/17/2020   TRIG 70 03/17/2020   Lab Results  Component Value Date   DDIMER (H) 06/03/2007    1.59        AT THE INHOUSE ESTABLISHED CUTOFF VALUE OF 0.48 ug/mL FEU, THIS ASSAY HAS BEEN DOCUMENTED IN THE LITERATURE TO HAVE   Recent Labs    06/01/20 1632  BNP 127.2*   Recent Labs    07/12/20 1017  PROBNP 309      Radiology Studies    DG Chest Portable 1 View  Result Date: 10/10/2020 CLINICAL DATA:  Weakness.  Abdominal pain and vomiting. EXAM: PORTABLE CHEST 1 VIEW COMPARISON:  06/01/2020, remote CT 06/03/2007 FINDINGS: Multi lead left-sided pacemaker in place. The heart is normal in size. Aortic atherosclerosis. The lungs are hyperinflated. Central bronchial thickening. No emphysema seen on prior CT. Streaky left lung base scarring. No acute airspace disease. No pulmonary edema. No pleural effusion or pneumothorax. No visualized pulmonary mass or nodule. No acute osseous abnormalities are seen. IMPRESSION: Hyperinflation and central bronchial thickening, imaging findings suggesting COPD. No focal airspace disease. Electronically Signed   By: Keith Rake M.D.   On: 10/10/2020 21:19    ECG & Cardiac Imaging    ECG: Monomorphic ventricular tachycardia originating from the mid-apical inferoseptum - personally reviewed.  11/23/2019: stress myoview Nuclear stress EF: 22%. No T wave inversion was noted during stress. There was no ST segment deviation noted during stress. Defect 1: There is a large defect of severe severity present in the basal inferior, mid anteroseptal, mid inferoseptal, mid inferior, apical septal, apical  inferior, apical lateral and apex location. Findings consistent with prior myocardial infarction with peri-infarct ischemia. This is a high risk study. The left ventricular ejection fraction is severely decreased (<30%).   Large size, severe severity mostly fixed (SDS 3) apical to mid inferior, apical, apical septal and anteroapical perfusion defect suggestive of mid to distal LAD and RCA territory scar with  minimal peri-infarct ischemia. LVEF 22% with akinesis of the previously mentioned walls. Compared to a prior study on 12/21/2012, there is no significant change.     04/09/2019; TTE  1. Left ventricular ejection fraction, by estimation, is 35%%. The left  ventricle has moderate to severely decreased function. There is mild left  ventricular hypertrophy. Left ventricular diastolic parameters are  consistent with Grade I diastolic  dysfunction (impaired relaxation).   2. Right ventricular systolic function is normal. The right ventricular  size is normal.   3. The mitral valve is normal in structure and function. Trivial mitral  valve regurgitation.   4. The aortic valve is abnormal. Aortic valve regurgitation is trivial.  Mild aortic valve sclerosis is present, with no evidence of aortic valve  stenosis.   5. The inferior vena cava is normal in size with greater than 50%  respiratory variability, suggesting right atrial pressure of 3 mmHg.    Assessment & Plan    Ventricular tachycardia. Presenting VT was below his detection zone, exiting from the mid-apical inferoseptum within a large area of scar. Very similar VT as in April when he presented with VT storm. I suspect it is slower now with amiodarone and he may need further adjustment of his VT zone. ATP was unsuccessful, but he converted with a single shock from the device. It may be that the amiodarone is under-dosed and he has vomited several doses of his medications. Given second presentation for this same VT within a few months,  ablation with substrate modification may be reasonable - we can discuss with EP.  - Continue amiodarone infusion at '1mg'$ /min - Repeat LFTs in the morning - unlikely amiodarone related - Keep on telemetry at all times - Please page cardiology on-call if any further VT - Goal K = 4.0 and Mag > 2.0 - Will discuss potential adjustment of VT zone and/or AAD therapy, as well as VT ablation at some point. No need to keep NPO at this point.    Chronic HFrEF / ischemic cardiomyopathy. Currently compensated, euvolemic, probably adequately perfused. Hold some of the medical therapy for tonight to allow for additional blood pressure in case he requires AAD escalation:  - Continue carvedilol - Half Entresto dose to 24/'26mg'$  - Hold hydralazine, Imdur, and spironolactone for now - Continue atorvastatin  Paroxysmal AF. no significant burden on device interrogation. - Amiodarone and carvedilol as above - Continue Xarelto if no contraindications  Signed, Marykay Lex, MD 10/10/2020, 10:18 PM  For questions or updates, please contact   Please consult [email or web address] for contact info under Cardiology/STEMI.

## 2020-10-10 NOTE — ED Notes (Signed)
Patient transported to CT with RN 

## 2020-10-10 NOTE — ED Notes (Signed)
Xray at the bedside.

## 2020-10-10 NOTE — ED Notes (Signed)
Pt O2 97% room air.

## 2020-10-10 NOTE — Telephone Encounter (Signed)
Spoke to wife. She states that patient is having this type symptoms prior to taking cardiac medication. Wife states the patient still  is still having problems   with keeping food  and drink. Patient does not have a primary care at present time.   Wife stated  she has  tried Dr [employer] office but they are not excepting new patients at present.  She states she has placed a call to [facility] primary care telephone line for assistance.  RN recommend wife to connect [employer]- since they live between [place] and [place].   Wife was wondering if Dr [name] could give her  referral to GI. RN  informed  wife  would  defer to Dr [name] but she could probably call to get an appointment ( [facility]) . She states she will call

## 2020-10-10 NOTE — ED Triage Notes (Signed)
Pt c/o severe abd pain and nausea and vomiting.

## 2020-10-10 NOTE — ED Provider Notes (Signed)
Emergency Department Provider Note   I have reviewed the triage vital signs and the nursing notes.   HISTORY  Chief Complaint Abdominal Pain and Emesis   HPI Benjamin Cook is a 75 y.o. male with medical history of CAD, CHF with AICD, hypertension, COPD presents to the emergency department initially with abdominal pain.  He describes episodes of lightheadedness and nausea which she attributes to taking some of his blood pressure medications.  He states he had abdominal pain on and off for weeks but symptoms became more severe today which prompted his ED visit.  He denies any chest pain, shortness of breath, heart palpitations.  He has had some nausea and vomiting.  Symptoms seem worse after eating.  Pain is mainly in the lower abdomen.  Patient tells me that despite nausea and abdominal pain he has been compliant with his home medications.   During the MSE process an EKG was obtained and patient was found to be in ventricular tachycardia and brought back to the acute care area.  Past Medical History:  Diagnosis Date   Adrenal mass ([place])    per pt this is remote (10 years) and benign by biopsy   AICD (automatic cardioverter/defibrillator) present    Anxiety    Arthritis    CAD (coronary artery disease)    a. s/p PCI to LAD 2001 b. myoview 2014 high risk with scar LAD/RCA territory but no ischemia   Cardiomyopathy, ischemic    CHF (congestive heart failure) ([name])    class II/III   COPD (chronic obstructive pulmonary disease) ([place])    smokes cigars but has quit cigarettes   Defibrillator discharge 04/21/2019   Diverticulitis    Dyspnea    Flu 03/2015   HTN (hypertension)    Hyperlipidemia    NSVT (nonsustained ventricular tachycardia) ([place]) 03/05/2017   Paroxysmal atrial fibrillation ([place]) 03/2015   chads2vasc score is 4   PSA (psoriatic arthritis) ([name])    increased   PVD (peripheral vascular disease) ([place])     Patient Active Problem List   Diagnosis Date Noted    Hypokalemia 06/04/2020   Elevated troponin 06/04/2020   Malnutrition of moderate degree 06/04/2020   Defibrillator discharge 04/22/2019   Hypocalcemia 08/08/2017   Screening examination for infectious disease 08/08/2017   Anemia, iron deficiency 08/08/2017   NSVT (nonsustained ventricular tachycardia) ([place]) 03/05/2017   AAA (abdominal aortic aneurysm) ([place]) 03/03/2017   Low back pain 01/01/2017   Adrenal mass ([place]) 11/24/2016   Acute Sigmoid diverticulitis 11/15/2016   Diverticulitis 11/15/2016   ICD (implantable cardioverter-defibrillator) lead failure 07/16/2016   Paroxysmal atrial fibrillation ([place]) 06/26/2015   Dyspnea 04/10/2015   Acute respiratory failure ([place]) 03/28/2015   Essential hypertension 0000000   Systolic CHF ([place]) 0000000   Pyrexia    Shortness of breath    Influenza A    VT (ventricular tachycardia) ([place])    ICD (implantable cardioverter-defibrillator) in place 06/08/2010   HFrEF (heart failure with reduced ejection fraction) ([place]) 04/18/2010   WEIGHT LOSS 11/01/2008   TOBACCO ABUSE 09/06/2008   Aneurysm of abdominal vessel (5.6 CM) 09/06/2008   HLD (hyperlipidemia) 09/05/2008   COUGH 04/08/2007   PSA, INCREASED 04/08/2007   OTHER ABNORMAL BLOOD CHEMISTRY 03/24/2007   Adrenal nodule ([place]) 10/01/2006   Anxiety state 10/01/2006   HYPERTENSION 10/01/2006   CAD (coronary artery disease) 10/01/2006   Cardiomyopathy, ischemic 10/01/2006   COPD, severe ([place]) 10/01/2006   HYPERGLYCEMIA 10/01/2006   HEMATURIA, HX OF 10/01/2006    Past  Surgical History:  Procedure Laterality Date   ABDOMINAL AORTIC ENDOVASCULAR STENT GRAFT N/A 03/03/2017   Procedure: ABDOMINAL AORTIC ENDOVASCULAR STENT GRAFT;  Surgeon: Waynetta Sandy, MD;  Location: [facility];  Service: Vascular;  Laterality: N/A;   cataract surgery     CORONARY ANGIOPLASTY WITH STENT PLACEMENT  2001   a. PCI to LAD   IMPLANTABLE CARDIOVERTER DEFIBRILLATOR (ICD) GENERATOR CHANGE N/A 03/18/2014   a.  SJM Fortify ST DR ICD implanted by [facility] for primary prevention b. gen change 03/2014    LEAD REVISION/REPAIR N/A 07/17/2016   Procedure: Lead Revision/Repair;  Surgeon: Evans Lance, MD;  Location: [facility];  Service: Cardiovascular;  Laterality: N/A;   LEFT HEART CATHETERIZATION WITH CORONARY ANGIOGRAM N/A 05/27/2014   Procedure: LEFT HEART CATHETERIZATION WITH CORONARY ANGIOGRAM;  Surgeon: Sherren Mocha, MD;  Location: [facility] CATH LAB;  Service: Cardiovascular;  Laterality: N/A;   REPAIR KNEE LIGAMENT      Allergies Patient has no known allergies.  Family History  Problem Relation Age of Onset   Cirrhosis Mother        due to ETOH   Cancer Neg Hx     Social History Social History   Tobacco Use   Smoking status: Every Day    Types: Cigars   Smokeless tobacco: Never   Tobacco comments:    smokes cigars now, no longer smokes cigarettes but previously smoked 2ppd  Vaping Use   Vaping Use: Never used  Substance Use Topics   Alcohol use: No   Drug use: No    Review of Systems  Constitutional: No fever/chills. Positive lightheadedness.  Eyes: No visual changes. ENT: No sore throat. Cardiovascular: Denies chest pain.  Respiratory: Denies shortness of breath. Gastrointestinal: Positive lower abdominal pain. Positive nausea and vomiting.  No diarrhea.  No constipation. Genitourinary: Negative for dysuria. Musculoskeletal: Negative for back pain. Skin: Negative for rash. Neurological: Negative for headaches, focal weakness or numbness.  10-point ROS otherwise negative.  ____________________________________________   PHYSICAL EXAM:  VITAL SIGNS: ED Triage Vitals  Enc Vitals Group     BP 10/10/20 1954 94/76     Pulse Rate 10/10/20 1954 (!) 147     Resp 10/10/20 1954 (!) 22     Temp 10/10/20 1954 (!) 97.5 F (36.4 C)     Temp Source 10/10/20 1954 Oral     SpO2 10/10/20 1954 97 %     Weight 10/10/20 2012 138 lb 14.2 oz (63 kg)     Height 10/10/20 2012 '5\' 10"'$   (1.778 m)   Constitutional: Alert and oriented. Able to provide a full history. Standing at bedside and transferring to bed when I walk in the room.  Eyes: Conjunctivae are normal.  Head: Atraumatic. Nose: No congestion/rhinnorhea. Mouth/Throat: Mucous membranes are moist.  Neck: No stridor.   Cardiovascular: Tachycardia. Good peripheral circulation. Grossly normal heart sounds.   Respiratory: Normal respiratory effort.  No retractions. Lungs CTAB. Gastrointestinal: Soft with mild lower abdominal tenderness. No rebound or guarding. No distention.  Musculoskeletal: No lower extremity tenderness nor edema. No gross deformities of extremities. Neurologic:  Normal speech and language. No gross focal neurologic deficits are appreciated.  Skin:  Skin is warm, dry and intact. No rash noted.   ____________________________________________   LABS (all labs ordered are listed, but only abnormal results are displayed)  Labs Reviewed  CBC WITH DIFFERENTIAL/PLATELET - Abnormal; Notable for the following components:      Result Value   WBC 11.7 (*)  RBC 4.10 (*)    Hemoglobin 12.9 (*)    RDW 15.6 (*)    Neutro Abs 8.5 (*)    Monocytes Absolute 1.3 (*)    All other components within normal limits  COMPREHENSIVE METABOLIC PANEL - Abnormal; Notable for the following components:   Glucose, Bld 104 (*)    Albumin 3.4 (*)    AST 58 (*)    ALT 77 (*)    All other components within normal limits  RESP PANEL BY RT-PCR (FLU A&B, COVID) ARPGX2  LIPASE, BLOOD  URINALYSIS, ROUTINE W REFLEX MICROSCOPIC   ____________________________________________  EKG   EKG Interpretation  Date/Time:  Tuesday October 10 2020 20:21:46 EDT Ventricular Rate:  149 PR Interval:    QRS Duration: 272 QT Interval:  412 QTC Calculation: 648 R Axis:   -85 Text Interpretation: Left axis deviation Right bundle branch block Left ventricular hypertrophy with repolarization abnormality ( R in aVL , Romhilt-Estes )  Inferior infarct , age undetermined Anterolateral infarct , age undetermined Abnormal ECG Confirmed by Nanda Quinton [phone]) on 10/10/2020 8:49:28 PM        ____________________________________________  RADIOLOGY  DG Chest Portable 1 View  Result Date: 10/10/2020 CLINICAL DATA:  Weakness.  Abdominal pain and vomiting. EXAM: PORTABLE CHEST 1 VIEW COMPARISON:  06/01/2020, remote CT 06/03/2007 FINDINGS: Multi lead left-sided pacemaker in place. The heart is normal in size. Aortic atherosclerosis. The lungs are hyperinflated. Central bronchial thickening. No emphysema seen on prior CT. Streaky left lung base scarring. No acute airspace disease. No pulmonary edema. No pleural effusion or pneumothorax. No visualized pulmonary mass or nodule. No acute osseous abnormalities are seen. IMPRESSION: Hyperinflation and central bronchial thickening, imaging findings suggesting COPD. No focal airspace disease. Electronically Signed   By: Keith Rake M.D.   On: 10/10/2020 21:19    ____________________________________________   PROCEDURES  Procedure(s) performed:   .Critical Care  Date/Time: 10/10/2020 11:03 PM Performed by: Margette Fast, MD Authorized by: Margette Fast, MD   Critical care provider statement:    Critical care time (minutes):  45   Critical care time was exclusive of:  Separately billable procedures and treating other patients and teaching time   Critical care was necessary to treat or prevent imminent or life-threatening deterioration of the following conditions:  Cardiac failure   Critical care was time spent personally by me on the following activities:  Discussions with consultants, evaluation of patient's response to treatment, examination of patient, ordering and performing treatments and interventions, ordering and review of laboratory studies, ordering and review of radiographic studies, pulse oximetry, re-evaluation of patient's condition, obtaining history from patient or  surrogate, review of old charts, blood draw for specimens and development of treatment plan with patient or surrogate   I assumed direction of critical care for this patient from another provider in my specialty: no     Care discussed with: admitting provider   .Sedation  Date/Time: 10/10/2020 11:03 PM Performed by: Margette Fast, MD Authorized by: Margette Fast, MD   Consent:    Consent obtained:  Emergent situation   Consent given by:  Spouse Universal protocol:    Immediately prior to procedure, a time out was called: yes     Patient identity confirmed:  Arm band Indications:    Procedure performed:  Cardioversion Pre-sedation assessment:    Time since last food or drink:  4 hours   ASA classification: class 4 - patient with severe systemic disease that is a constant  threat to life     Mallampati score:  II - soft palate, uvula, fauces visible   Pre-sedation assessments completed and reviewed: airway patency, cardiovascular function, hydration status, mental status, nausea/vomiting, pain level, respiratory function and temperature   Immediate pre-procedure details:    Reassessment: Patient reassessed immediately prior to procedure     Reviewed: vital signs, relevant labs/tests and NPO status     Verified: bag valve mask available, emergency equipment available, intubation equipment available, IV patency confirmed, oxygen available and suction available   Procedure details (see MAR for exact dosages):    Preoxygenation:  Nasal cannula   Sedation:  Midazolam   Intended level of sedation: deep   Analgesia:  Fentanyl   Intra-procedure monitoring:  Blood pressure monitoring, cardiac monitor, continuous pulse oximetry, continuous capnometry, frequent LOC assessments and frequent vital sign checks   Intra-procedure events: none     Total Provider sedation time (minutes):  30 Post-procedure details:    Attendance: Constant attendance by certified staff until patient recovered      Recovery: Patient returned to pre-procedure baseline     Post-sedation assessments completed and reviewed: airway patency, cardiovascular function, hydration status, mental status, nausea/vomiting, pain level, respiratory function and temperature     Patient is stable for discharge or admission: no     Procedure completion:  Tolerated well, no immediate complications  ____________________________________________   INITIAL IMPRESSION / ASSESSMENT AND PLAN / ED COURSE  Pertinent labs & imaging results that were available during my care of the patient were reviewed by me and considered in my medical decision making (see chart for details).   Patient presents emergency department with abdominal pain nausea, vomiting mainly but during the MSE process an EKG was performed given his tachycardia and he was found to be in V. tach.  He has not felt any defibrillating shocks.  He has a pulse.  He is mentating normally.  His main complaints are abdominal pain and vomiting which have been intermittent over the past several weeks.  Will hold on defibrillation/cardioversion and move to amnio bolus with infusion.  Have paged cardiology immediately upon patient's arrival.  Patient evaluated urgently by the cardiology team.  Appreciate assistance with the case.  They attempted to convert rhythm with medication as well as the patient's pacemaker.  Ultimately, it was decided to sedate the patient and cardiovert.  The cardiology fellow performed a cardioversion and I assisted with sedation.  Patient returned to normal rhythm.  Lab work shows mild elevation in LFTs.  In discussing with the cardiology team they will consult but with elevated LFTs and abdominal pain feel that more work-up is needed regarding this.  Will order CT abdomen pelvis and cardiology is requesting admit to the medicine service. They will continue to follow regarding the V-tach.   Discussed patient's case with [facility] to request admission. Patient and  family (if present) updated with plan. Care transferred to [facility] Main service.  I reviewed all nursing notes, vitals, pertinent old records, EKGs, labs, imaging (as available).  ____________________________________________  FINAL CLINICAL IMPRESSION(S) / ED DIAGNOSES  Final diagnoses:  Ventricular tachycardia ([name])  Generalized abdominal pain  Nausea     MEDICATIONS GIVEN DURING THIS VISIT:  Medications  amiodarone (NEXTERONE PREMIX) 360-4.14 MG/200ML-% (1.8 mg/mL) IV infusion (60 mg/hr Intravenous Rate/Dose Verify 10/10/20 2230)    Followed by  amiodarone (NEXTERONE PREMIX) 360-4.14 MG/200ML-% (1.8 mg/mL) IV infusion (has no administration in time range)  fentaNYL (SUBLIMAZE) injection 100 mcg (100 mcg Intravenous Not Given  10/10/20 2245)  midazolam (VERSED) injection 2 mg (2 mg Intravenous Not Given 10/10/20 2245)  amiodarone (NEXTERONE) 1.8 mg/mL load via infusion 150 mg (150 mg Intravenous Bolus from Bag 10/10/20 2054)  amiodarone (NEXTERONE) IV bolus only 150 mg/100 mL ( Intravenous Rate/Dose Change 10/10/20 2141)  midazolam (VERSED) injection 2 mg (2 mg Intravenous Given 10/10/20 2151)  fentaNYL (SUBLIMAZE) injection 100 mcg (50 mcg Intravenous Given 10/10/20 2152)  iohexol (OMNIPAQUE) 350 MG/ML injection 80 mL (80 mLs Intravenous Contrast Given 10/10/20 2302)    Note:  This document was prepared using Dragon voice recognition software and may include unintentional dictation errors.  Nanda Quinton, MD, [facility] Emergency Medicine    Kaide Gage, Wonda Olds, MD 10/11/20 [phone]

## 2020-10-11 ENCOUNTER — Encounter (HOSPITAL_COMMUNITY): Payer: Self-pay | Admitting: Internal Medicine

## 2020-10-11 ENCOUNTER — Inpatient Hospital Stay (HOSPITAL_COMMUNITY): Payer: Medicare (Managed Care)

## 2020-10-11 DIAGNOSIS — I472 Ventricular tachycardia, unspecified: Secondary | ICD-10-CM

## 2020-10-11 DIAGNOSIS — I2510 Atherosclerotic heart disease of native coronary artery without angina pectoris: Secondary | ICD-10-CM | POA: Diagnosis present

## 2020-10-11 DIAGNOSIS — Z20822 Contact with and (suspected) exposure to covid-19: Secondary | ICD-10-CM | POA: Diagnosis present

## 2020-10-11 DIAGNOSIS — I480 Paroxysmal atrial fibrillation: Secondary | ICD-10-CM

## 2020-10-11 DIAGNOSIS — Z95810 Presence of automatic (implantable) cardiac defibrillator: Secondary | ICD-10-CM | POA: Diagnosis not present

## 2020-10-11 DIAGNOSIS — I255 Ischemic cardiomyopathy: Secondary | ICD-10-CM | POA: Diagnosis present

## 2020-10-11 DIAGNOSIS — Z7984 Long term (current) use of oral hypoglycemic drugs: Secondary | ICD-10-CM | POA: Diagnosis not present

## 2020-10-11 DIAGNOSIS — R109 Unspecified abdominal pain: Secondary | ICD-10-CM | POA: Diagnosis present

## 2020-10-11 DIAGNOSIS — I10 Essential (primary) hypertension: Secondary | ICD-10-CM

## 2020-10-11 DIAGNOSIS — I4510 Unspecified right bundle-branch block: Secondary | ICD-10-CM | POA: Diagnosis present

## 2020-10-11 DIAGNOSIS — D649 Anemia, unspecified: Secondary | ICD-10-CM | POA: Diagnosis present

## 2020-10-11 DIAGNOSIS — Z811 Family history of alcohol abuse and dependence: Secondary | ICD-10-CM | POA: Diagnosis not present

## 2020-10-11 DIAGNOSIS — I5022 Chronic systolic (congestive) heart failure: Secondary | ICD-10-CM | POA: Diagnosis present

## 2020-10-11 DIAGNOSIS — Z79899 Other long term (current) drug therapy: Secondary | ICD-10-CM | POA: Diagnosis not present

## 2020-10-11 DIAGNOSIS — L4050 Arthropathic psoriasis, unspecified: Secondary | ICD-10-CM | POA: Diagnosis present

## 2020-10-11 DIAGNOSIS — R1084 Generalized abdominal pain: Secondary | ICD-10-CM | POA: Diagnosis not present

## 2020-10-11 DIAGNOSIS — K5730 Diverticulosis of large intestine without perforation or abscess without bleeding: Secondary | ICD-10-CM | POA: Diagnosis present

## 2020-10-11 DIAGNOSIS — Z7901 Long term (current) use of anticoagulants: Secondary | ICD-10-CM | POA: Diagnosis not present

## 2020-10-11 DIAGNOSIS — I110 Hypertensive heart disease with heart failure: Secondary | ICD-10-CM | POA: Diagnosis present

## 2020-10-11 DIAGNOSIS — N400 Enlarged prostate without lower urinary tract symptoms: Secondary | ICD-10-CM | POA: Diagnosis present

## 2020-10-11 DIAGNOSIS — F17290 Nicotine dependence, other tobacco product, uncomplicated: Secondary | ICD-10-CM | POA: Diagnosis present

## 2020-10-11 DIAGNOSIS — Z8679 Personal history of other diseases of the circulatory system: Secondary | ICD-10-CM | POA: Diagnosis not present

## 2020-10-11 DIAGNOSIS — Z955 Presence of coronary angioplasty implant and graft: Secondary | ICD-10-CM | POA: Diagnosis not present

## 2020-10-11 DIAGNOSIS — J449 Chronic obstructive pulmonary disease, unspecified: Secondary | ICD-10-CM | POA: Diagnosis present

## 2020-10-11 DIAGNOSIS — I5020 Unspecified systolic (congestive) heart failure: Secondary | ICD-10-CM | POA: Diagnosis not present

## 2020-10-11 DIAGNOSIS — D3500 Benign neoplasm of unspecified adrenal gland: Secondary | ICD-10-CM | POA: Diagnosis present

## 2020-10-11 DIAGNOSIS — I739 Peripheral vascular disease, unspecified: Secondary | ICD-10-CM | POA: Diagnosis present

## 2020-10-11 DIAGNOSIS — R110 Nausea: Secondary | ICD-10-CM | POA: Diagnosis present

## 2020-10-11 DIAGNOSIS — E785 Hyperlipidemia, unspecified: Secondary | ICD-10-CM | POA: Diagnosis present

## 2020-10-11 LAB — COMPREHENSIVE METABOLIC PANEL
ALT: 64 U/L — ABNORMAL HIGH (ref 0–44)
AST: 49 U/L — ABNORMAL HIGH (ref 15–41)
Albumin: 3.1 g/dL — ABNORMAL LOW (ref 3.5–5.0)
Alkaline Phosphatase: 61 U/L (ref 38–126)
Anion gap: 7 (ref 5–15)
BUN: 13 mg/dL (ref 8–23)
CO2: 25 mmol/L (ref 22–32)
Calcium: 9.1 mg/dL (ref 8.9–10.3)
Chloride: 102 mmol/L (ref 98–111)
Creatinine, Ser: 1.04 mg/dL (ref 0.61–1.24)
GFR, Estimated: >60 mL/min (ref >60)
Glucose, Bld: 101 mg/dL — ABNORMAL HIGH (ref 70–99)
Potassium: 4.1 mmol/L (ref 3.5–5.1)
Sodium: 134 mmol/L — ABNORMAL LOW (ref 135–145)
Total Bilirubin: 0.5 mg/dL (ref 0.3–1.2)
Total Protein: 6.7 g/dL (ref 6.5–8.1)

## 2020-10-11 LAB — MAGNESIUM: Magnesium: 1.9 mg/dL (ref 1.7–2.4)

## 2020-10-11 LAB — CBC
HCT: 39.6 % (ref 39.0–52.0)
Hemoglobin: 13.6 g/dL (ref 13.0–17.0)
MCH: 32 pg (ref 26.0–34.0)
MCHC: 34.3 g/dL (ref 30.0–36.0)
MCV: 93.2 fL (ref 80.0–100.0)
Platelets: 232 K/uL (ref 150–400)
RBC: 4.25 MIL/uL (ref 4.22–5.81)
RDW: 15.3 % (ref 11.5–15.5)
WBC: 9.5 K/uL (ref 4.0–10.5)
nRBC: 0 % (ref 0.0–0.2)

## 2020-10-11 LAB — URINALYSIS, ROUTINE W REFLEX MICROSCOPIC
Bilirubin Urine: NEGATIVE
Glucose, UA: NEGATIVE mg/dL
Hgb urine dipstick: NEGATIVE
Ketones, ur: NEGATIVE mg/dL
Leukocytes,Ua: NEGATIVE
Nitrite: NEGATIVE
Protein, ur: NEGATIVE mg/dL
Specific Gravity, Urine: >1.046 — ABNORMAL HIGH (ref 1.005–1.030)
pH: 5 (ref 5.0–8.0)

## 2020-10-11 LAB — TSH: TSH: 0.95 u[IU]/mL (ref 0.350–4.500)

## 2020-10-11 LAB — MRSA NEXT GEN BY PCR, NASAL: MRSA by PCR Next Gen: NOT DETECTED

## 2020-10-11 LAB — ECHOCARDIOGRAM COMPLETE
Area-P 1/2: 3.08 cm2
Height: 70 in
S' Lateral: 4.5 cm
Weight: 2222.24 oz

## 2020-10-11 LAB — APTT: aPTT: 50 seconds — ABNORMAL HIGH (ref 24–36)

## 2020-10-11 LAB — GLUCOSE, CAPILLARY: Glucose-Capillary: 124 mg/dL — ABNORMAL HIGH (ref 70–99)

## 2020-10-11 MED ORDER — CARVEDILOL 12.5 MG PO TABS: 25.0000 mg | ORAL_TABLET | Freq: Two times a day (BID) | ORAL | Status: DC

## 2020-10-11 MED ORDER — SACUBITRIL-VALSARTAN 24-26 MG PO TABS
1.0000 | ORAL_TABLET | Freq: Two times a day (BID) | ORAL | Status: DC
  Administered 2020-10-11: 1 via ORAL
  Filled 2020-10-11 (×2): qty 1

## 2020-10-11 MED ORDER — FUROSEMIDE 20 MG PO TABS: 20.0000 mg | ORAL_TABLET | Freq: Every day | ORAL | Status: DC

## 2020-10-11 MED ORDER — ENSURE ENLIVE PO LIQD
237.0000 mL | Freq: Two times a day (BID) | ORAL | Status: DC | PRN
  Filled 2020-10-11: qty 237

## 2020-10-11 MED ORDER — ASPIRIN 81 MG PO CHEW
81.0000 mg | CHEWABLE_TABLET | ORAL | Status: AC
  Administered 2020-10-12: 81 mg via ORAL
  Filled 2020-10-11: qty 1

## 2020-10-11 MED ORDER — SPIRONOLACTONE 25 MG PO TABS: 25.0000 mg | ORAL_TABLET | Freq: Every day | ORAL | Status: DC

## 2020-10-11 MED ORDER — MOMETASONE FURO-FORMOTEROL FUM 100-5 MCG/ACT IN AERO
2.0000 | INHALATION_SPRAY | Freq: Two times a day (BID) | RESPIRATORY_TRACT | Status: DC
  Administered 2020-10-11 – 2020-10-16 (×11): 2 via RESPIRATORY_TRACT
  Filled 2020-10-11: qty 8.8

## 2020-10-11 MED ORDER — ORAL CARE MOUTH RINSE
15.0000 mL | Freq: Two times a day (BID) | OROMUCOSAL | Status: DC
  Administered 2020-10-11 – 2020-10-15 (×7): 15 mL via OROMUCOSAL

## 2020-10-11 MED ORDER — LIDOCAINE HCL (CARDIAC) PF 100 MG/5ML IV SOSY
PREFILLED_SYRINGE | INTRAVENOUS | Status: AC
  Administered 2020-10-11: 100 mg via INTRAVENOUS
  Filled 2020-10-11: qty 5

## 2020-10-11 MED ORDER — SACUBITRIL-VALSARTAN 49-51 MG PO TABS
1.0000 | ORAL_TABLET | Freq: Two times a day (BID) | ORAL | Status: DC
  Administered 2020-10-11 – 2020-10-12 (×3): 1 via ORAL
  Filled 2020-10-11 (×4): qty 1

## 2020-10-11 MED ORDER — OMEGA-3-ACID ETHYL ESTERS 1 G PO CAPS
1.0000 g | ORAL_CAPSULE | Freq: Every day | ORAL | Status: DC
  Administered 2020-10-11 – 2020-10-16 (×6): 1 g via ORAL
  Filled 2020-10-11 (×6): qty 1

## 2020-10-11 MED ORDER — ISOSORBIDE MONONITRATE ER 30 MG PO TB24: 15.0000 mg | ORAL_TABLET | Freq: Every day | ORAL | Status: DC

## 2020-10-11 MED ORDER — AMIODARONE IV BOLUS ONLY 150 MG/100ML
150.0000 mg | Freq: Once | INTRAVENOUS | Status: AC
  Administered 2020-10-11: 150 mg via INTRAVENOUS

## 2020-10-11 MED ORDER — SODIUM CHLORIDE 0.9% FLUSH: 3.0000 mL | INTRAVENOUS | Status: DC | PRN

## 2020-10-11 MED ORDER — RIVAROXABAN 20 MG PO TABS: 20.0000 mg | ORAL_TABLET | Freq: Every day | ORAL | Status: DC

## 2020-10-11 MED ORDER — MAGNESIUM SULFATE 2 GM/50ML IV SOLN
2.0000 g | Freq: Once | INTRAVENOUS | Status: AC
  Administered 2020-10-11: 2 g via INTRAVENOUS
  Filled 2020-10-11: qty 50

## 2020-10-11 MED ORDER — HEPARIN (PORCINE) 25000 UT/250ML-% IV SOLN
1050.0000 [IU]/h | INTRAVENOUS | Status: DC
  Administered 2020-10-11: 900 [IU]/h via INTRAVENOUS
  Filled 2020-10-11: qty 250

## 2020-10-11 MED ORDER — SODIUM CHLORIDE 0.9 % IV SOLN: INTRAVENOUS | Status: DC

## 2020-10-11 MED ORDER — NITROGLYCERIN 0.4 MG SL SUBL
0.4000 mg | SUBLINGUAL_TABLET | SUBLINGUAL | Status: DC | PRN
  Administered 2020-10-12: 0.4 mg via SUBLINGUAL
  Filled 2020-10-11: qty 1

## 2020-10-11 MED ORDER — SODIUM CHLORIDE 0.9 % IV SOLN: 250.0000 mL | INTRAVENOUS | Status: DC | PRN

## 2020-10-11 MED ORDER — SACUBITRIL-VALSARTAN 49-51 MG PO TABS: 1.0000 | ORAL_TABLET | Freq: Two times a day (BID) | ORAL | Status: DC

## 2020-10-11 MED ORDER — CARVEDILOL 6.25 MG PO TABS
6.2500 mg | ORAL_TABLET | Freq: Two times a day (BID) | ORAL | Status: DC
  Administered 2020-10-11 – 2020-10-16 (×10): 6.25 mg via ORAL
  Filled 2020-10-11 (×10): qty 1

## 2020-10-11 MED ORDER — CHLORHEXIDINE GLUCONATE CLOTH 2 % EX PADS
6.0000 | MEDICATED_PAD | Freq: Every day | CUTANEOUS | Status: DC
  Administered 2020-10-11 – 2020-10-14 (×5): 6 via TOPICAL

## 2020-10-11 MED ORDER — POTASSIUM CHLORIDE CRYS ER 20 MEQ PO TBCR
20.0000 meq | EXTENDED_RELEASE_TABLET | Freq: Every day | ORAL | Status: DC
  Administered 2020-10-11: 20 meq via ORAL
  Filled 2020-10-11: qty 1

## 2020-10-11 MED ORDER — PANTOPRAZOLE SODIUM 40 MG IV SOLR
40.0000 mg | Freq: Two times a day (BID) | INTRAVENOUS | Status: DC
  Administered 2020-10-11 – 2020-10-16 (×11): 40 mg via INTRAVENOUS
  Filled 2020-10-11 (×11): qty 40

## 2020-10-11 MED ORDER — PERFLUTREN LIPID MICROSPHERE
1.0000 mL | INTRAVENOUS | Status: AC | PRN
  Administered 2020-10-11: 2 mL via INTRAVENOUS
  Filled 2020-10-11: qty 10

## 2020-10-11 MED ORDER — SODIUM CHLORIDE 0.9% FLUSH
3.0000 mL | Freq: Two times a day (BID) | INTRAVENOUS | Status: DC
  Administered 2020-10-11 – 2020-10-12 (×2): 3 mL via INTRAVENOUS

## 2020-10-11 MED ORDER — LIDOCAINE HCL (CARDIAC) PF 100 MG/5ML IV SOSY: 100.0000 mg | PREFILLED_SYRINGE | INTRAVENOUS | Status: AC

## 2020-10-11 MED ORDER — LIDOCAINE IN D5W 4-5 MG/ML-% IV SOLN
1.0000 mg/min | INTRAVENOUS | Status: DC
  Administered 2020-10-11: 1 mg/min via INTRAVENOUS
  Filled 2020-10-11: qty 500

## 2020-10-11 MED ORDER — AMIODARONE IV BOLUS ONLY 150 MG/100ML: 150.0000 mg | Freq: Once | INTRAVENOUS | Status: DC

## 2020-10-11 MED ORDER — ATORVASTATIN CALCIUM 80 MG PO TABS
80.0000 mg | ORAL_TABLET | Freq: Every evening | ORAL | Status: DC
  Administered 2020-10-11 – 2020-10-15 (×5): 80 mg via ORAL
  Filled 2020-10-11 (×5): qty 1

## 2020-10-11 MED ORDER — AMIODARONE LOAD VIA INFUSION
150.0000 mg | Freq: Once | INTRAVENOUS | Status: AC
  Administered 2020-10-11: 150 mg via INTRAVENOUS
  Filled 2020-10-11: qty 83.34

## 2020-10-11 MED ORDER — HYDRALAZINE HCL 25 MG PO TABS: 25.0000 mg | ORAL_TABLET | Freq: Three times a day (TID) | ORAL | Status: DC

## 2020-10-11 NOTE — Progress Notes (Signed)
ANTICOAGULATION CONSULT NOTE  Pharmacy Consult for heparin Indication: atrial fibrillation  No Known Allergies  Patient Measurements: Height: '5\' 10"'$  (177.8 cm) Weight: 63 kg (138 lb 14.2 oz) IBW/kg (Calculated) : 73 Heparin Dosing Weight: 63kg  Vital Signs: Temp: 98.5 F (36.9 C) (09/07 2009) Temp Source: Oral (09/07 2009) BP: 157/72 (09/07 2000) Pulse Rate: 59 (09/07 2000)  Labs: Recent Labs    10/10/20 2023 10/11/20 1101 10/11/20 2009  HGB 12.9* 13.6  --   HCT 39.5 39.6  --   PLT 260 232  --   APTT  --   --  50*  CREATININE 1.19 1.04  --      Estimated Creatinine Clearance: 53 mL/min (by C-G formula based on SCr of 1.04 mg/dL).   Medical History: Past Medical History:  Diagnosis Date   Adrenal mass ([place])    per pt this is remote (10 years) and benign by biopsy   AICD (automatic cardioverter/defibrillator) present    Anxiety    Arthritis    CAD (coronary artery disease)    a. s/p PCI to LAD 2001 b. myoview 2014 high risk with scar LAD/RCA territory but no ischemia   Cardiomyopathy, ischemic    CHF (congestive heart failure) ([name])    class II/III   COPD (chronic obstructive pulmonary disease) ([place])    smokes cigars but has quit cigarettes   Defibrillator discharge 04/21/2019   Diverticulitis    Dyspnea    Flu 03/2015   HTN (hypertension)    Hyperlipidemia    NSVT (nonsustained ventricular tachycardia) ([place]) 03/05/2017   Paroxysmal atrial fibrillation ([place]) 03/2015   chads2vasc score is 4   PSA (psoriatic arthritis) ([name])    increased   PVD (peripheral vascular disease) ([name])     Assessment: 56 yoM with hx AF admitted with VT. Pt on Xarelto at home (last dose 9/5 pm), pharmacy to hold and switch to heparin with need for cath. CBC stable overnight on admit.  Initial aPTT this evening is below goal.  No known issues with IV infusion, no overt bleeding or complications noted.  Goal of Therapy:  Heparin level 0.3-0.7 units/ml aPTT 66-102  seconds Monitor platelets by anticoagulation protocol: Yes   Plan:  Hold Xarelto Increase IV heparin to 1050 units/hr. Repeat aPTT in 8 hrs. Daily heparin level, aPTT, CBC  [place] Crane, Roylene Reason, [facility] Clinical Pharmacist  10/11/2020 9:35 PM   [facility] pharmacy phone numbers are listed on [email or web address]

## 2020-10-11 NOTE — Progress Notes (Signed)
Progress Note  Patient Name: Benjamin Cook Date of Encounter: 10/11/2020  [facility] Cardiologist: Kirk Ruths, MD   Subjective   No CP or dyspnea  Inpatient Medications    Scheduled Meds:  atorvastatin  80 mg Oral QPM   carvedilol  6.25 mg Oral BID WC   Chlorhexidine Gluconate Cloth  6 each Topical Daily   mometasone-formoterol  2 puff Inhalation BID   omega-3 acid ethyl esters  1 g Oral Daily   pantoprazole (PROTONIX) IV  40 mg Intravenous Q12H   potassium chloride SA  20 mEq Oral Daily   sacubitril-valsartan  1 tablet Oral BID   Continuous Infusions:  amiodarone 30 mg/hr (10/11/20 1018)   heparin 900 Units/hr (10/11/20 1226)   lidocaine 1 mg/min (10/11/20 1219)   magnesium sulfate bolus IVPB     PRN Meds: feeding supplement, nitroGLYCERIN   Vital Signs    Vitals:   10/11/20 0645 10/11/20 0700 10/11/20 0954 10/11/20 1100  BP: (!) 144/72 (!) 145/70 133/77 (!) 164/77  Pulse: 60 (!) 59 60 (!) 59  Resp: 20 16 (!) 22 (!) 24  Temp:   98.1 F (36.7 C) 98.2 F (36.8 C)  TempSrc:   Oral Oral  SpO2: 93% 93% 93% 96%  Weight:      Height:        Intake/Output Summary (Last 24 hours) at 10/11/2020 1414 Last data filed at 10/11/2020 1300 Gross per 24 hour  Intake 400 ml  Output 360 ml  Net 40 ml   Last 3 Weights 10/10/2020 07/12/2020 06/04/2020  Weight (lbs) 138 lb 14.2 oz 140 lb 141 lb 5 oz  Weight (kg) 63 kg 63.504 kg 64.1 kg      Telemetry    Sinus with V pacing - Personally Reviewed  Physical Exam   GEN: No acute distress.   Neck: No JVD Cardiac: RRR Respiratory: Clear to auscultation bilaterally. GI: Soft, nontender, non-distended  MS: No edema Neuro:  Nonfocal  Psych: Normal affect   Labs    Chemistry Recent Labs  Lab 10/10/20 2023 10/11/20 1101  NA 137 134*  K 4.3 4.1  CL 103 102  CO2 26 25  GLUCOSE 104* 101*  BUN 13 13  CREATININE 1.19 1.04  CALCIUM 9.3 9.1  PROT 7.1 6.7  ALBUMIN 3.4* 3.1*  AST 58* 49*  ALT 77* 64*  ALKPHOS  70 61  BILITOT 0.7 0.5  GFRNONAA >60 >60  ANIONGAP 8 7     Hematology Recent Labs  Lab 10/10/20 2023 10/11/20 1101  WBC 11.7* 9.5  RBC 4.10* 4.25  HGB 12.9* 13.6  HCT 39.5 39.6  MCV 96.3 93.2  MCH 31.5 32.0  MCHC 32.7 34.3  RDW 15.6* 15.3  PLT 260 232    Radiology    CT ABDOMEN PELVIS W CONTRAST  Result Date: 10/10/2020 CLINICAL DATA:  Abdominal pain, acute, nonlocalized EXAM: CT ABDOMEN AND PELVIS WITH CONTRAST TECHNIQUE: Multidetector CT imaging of the abdomen and pelvis was performed using the standard protocol following bolus administration of intravenous contrast. CONTRAST:  100 cc Omnipaque 300 COMPARISON:  04/04/2017 FINDINGS: Lower chest: There is stable left lower lobe bronchiectasis and mucoid impaction with associated parenchymal scarring and volume loss. No pleural effusion. Pacemaker leads are seen within the right heart. Left ventricular dilation is noted with stable thinning of the septal and apical wall in keeping with prior myocardial infarction. Trace pericardial effusion. Hepatobiliary: No focal liver abnormality is seen. No gallstones, gallbladder wall thickening, or biliary  dilatation. Pancreas: Unremarkable Spleen: Unremarkable Adrenals/Urinary Tract: Stable 3.3 x 4.4 cm heterogeneously enhancing right adrenal mass, stable since remote prior examination of 10/14/2003, likely representing a benign adrenal adenoma or myelolipoma. Left adrenal gland is unremarkable. The kidneys are normal in size and position. Scattered simple cortical cysts are seen within the kidneys bilaterally. The kidneys are otherwise unremarkable. The prostate gland is centrally enlarged and protrudes into the bladder lumen. The bladder is not distended. Stomach/Bowel: Moderate sigmoid diverticulosis. Unremarkable loops of small bowel are seen within small bilateral inguinal hernias. The stomach, small bowel, and large bowel are otherwise unremarkable. Appendix normal. No free intraperitoneal gas or  fluid. Vascular/Lymphatic: Short segment focal dissection of the a suprarenal aorta is again identified with a patent false lumen noted posteriorly. No evidence of rupture or periaortic inflammatory change. Infrarenal abdominal aortic aneurysm has undergone endovascular repair utilizing a bifurcated stent graft demonstrating infrarenal proximal fixation and bilateral common iliac distal landing zones. Small mural thrombus is again identified within the a left common iliac limb, unchanged, without evidence of hemodynamically significant stenosis. The aneurysm sac is unchanged measuring 4.7 x 5.0 cm at axial image # 36/3. Extensive atherosclerotic calcification noted within the a mesenteric and renal vasculature, however, the degree of stenosis is not well assessed on this examination. 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 No pathologic adenopathy within the abdomen and pelvis. Reproductive: There is marked prostatic enlargement, particularly centrally which protrudes into the bladder lumen. This appears similar to prior examination. Other: Small bilateral inguinal hernias are present. Rectum unremarkable. Musculoskeletal: Degenerative changes are seen within the lumbar spine. No lytic or blastic bone lesions are identified. IMPRESSION: No acute intra-abdominal pathology identified. No definite radiographic explanation for the patient's reported symptoms. Stable left basilar bronchiectasis with superimposed mucoid impaction and volume loss. Stable 4.4 cm right adrenal mass since remote prior examination of 10/14/2003 likely representing a benign adrenal adenoma or myelolipoma. Moderate sigmoid diverticulosis. 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 Marked  prostatic enlargement without evidence of bladder outlet obstruction. Small bilateral inguinal hernias containing small bowel without evidence of obstruction. Aortic aneurysm NOS (ICD10-I71.9). Aortic Atherosclerosis (ICD10-I70.0). Electronically Signed   By: Fidela Salisbury M.D.   On: 10/10/2020 23:21   DG Chest Portable 1 View  Result Date: 10/10/2020 CLINICAL DATA:  Weakness.  Abdominal pain and vomiting. EXAM: PORTABLE CHEST 1 VIEW COMPARISON:  06/01/2020, remote CT 06/03/2007 FINDINGS: Multi lead left-sided pacemaker in place. The heart is normal in size. Aortic atherosclerosis. The lungs are hyperinflated. Central bronchial thickening. No emphysema seen on prior CT. Streaky left lung base scarring. No acute airspace disease. No pulmonary edema. No pleural effusion or pneumothorax. No visualized pulmonary mass or nodule. No acute osseous abnormalities are seen. IMPRESSION: Hyperinflation and central bronchial thickening, imaging findings suggesting COPD. No focal airspace disease. Electronically Signed   By: Keith Rake M.D.   On: 10/10/2020 21:19      Patient Profile     75 y.o. male with past medical history of ischemic cardiomyopathy, chronic systolic congestive heart failure, prior ventricular tachycardia, prior ICD, paroxysmal atrial fibrillation, hypertension, hyperlipidemia, previous abdominal aortic aneurysm repair, COPD admitted with recurrent ventricular tachycardia.  Last echocardiogram March 2021 showed ejection fraction [id number], grade 1 diastolic dysfunction and trace aortic insufficiency.  Assessment & Plan    1 ventricular tachycardia-patient is in sinus today.  Continue amiodarone and lidocaine.  Plan will be to proceed with cardiac catheterization tomorrow to exclude ischemia mediated VT.  The risk and benefits including myocardial infarction, CVA and death discussed and he agrees to proceed.  2 ischemic cardiomyopathy-repeat echocardiogram pending.  ICD in place.  Increase  Entresto to 49/51 twice daily.  Continue carvedilol.  Titrate as blood pressure allows.  Will add Jardiance 10 mg daily following catheterization.  Will resume spironolactone prior to discharge.  Would like to not resume hydralazine nitrates.  3 chronic systolic congestive heart failure-patient is not volume overloaded on examination.  We will not diurese at this point.  4 history of paroxysmal atrial fibrillation-patient is in sinus rhythm.  Continue beta-blocker.  Resume Xarelto once all procedures complete.  5 coronary artery disease-continue statin.  He is not on aspirin at this point given need for anticoagulation.  For questions or updates, please contact [place] Please consult [email or web address] for contact info under        Signed, Kirk Ruths, MD  10/11/2020, 2:14 PM

## 2020-10-11 NOTE — H&P (Signed)
History and Physical    Benjamin Cook [id number] DOB: 03-12-1945 DOA: 10/10/2020  PCP: Pcp, No  Patient coming from: Home.  Chief Complaint: Abdominal pain nausea vomiting.  HPI: LEA ASA is a 75 y.o. male with history of CAD status post PCI, chronic systolic CHF status post ICD placement, paroxysmal atrial fibrillation, hypertension presents to the ER with complaints of having abdominal discomfort and nausea vomiting with poor oral intake.  This has been ongoing for the last 4 weeks but over the last few days it has worsened.  Patient states he initially had some relief with Pepto-Bismol.  Denies any palpitation but did have some chest pressure off and on.  Patient also states that he gets dizzy on taking hydralazine.  ED Course: In the ER patient was found to be in V. tach and cardiology on-call was consulted and patient was cardioverted.  CT abdomen pelvis is unremarkable.  Patient's labs show mildly elevated LFTs at AST of 58 and ALT of 77.  Total bilirubin 0.7.  Hemoglobin is 12.9.  WBC 11.7.  COVID test was negative.  Patient was started on amiodarone bolus and amiodarone infusion.  Review of Systems: As per HPI, rest all negative.   Past Medical History:  Diagnosis Date   Adrenal mass ([place])    per pt this is remote (10 years) and benign by biopsy   AICD (automatic cardioverter/defibrillator) present    Anxiety    Arthritis    CAD (coronary artery disease)    a. s/p PCI to LAD 2001 b. myoview 2014 high risk with scar LAD/RCA territory but no ischemia   Cardiomyopathy, ischemic    CHF (congestive heart failure) ([name])    class II/III   COPD (chronic obstructive pulmonary disease) ([place])    smokes cigars but has quit cigarettes   Defibrillator discharge 04/21/2019   Diverticulitis    Dyspnea    Flu 03/2015   HTN (hypertension)    Hyperlipidemia    NSVT (nonsustained ventricular tachycardia) ([place]) 03/05/2017   Paroxysmal atrial fibrillation ([place]) 03/2015    chads2vasc score is 4   PSA (psoriatic arthritis) ([name])    increased   PVD (peripheral vascular disease) ([place])     Past Surgical History:  Procedure Laterality Date   ABDOMINAL AORTIC ENDOVASCULAR STENT GRAFT N/A 03/03/2017   Procedure: ABDOMINAL AORTIC ENDOVASCULAR STENT GRAFT;  Surgeon: Waynetta Sandy, MD;  Location: [facility];  Service: Vascular;  Laterality: N/A;   cataract surgery     CORONARY ANGIOPLASTY WITH STENT PLACEMENT  2001   a. PCI to LAD   IMPLANTABLE CARDIOVERTER DEFIBRILLATOR (ICD) GENERATOR CHANGE N/A 03/18/2014   a. SJM Fortify ST DR ICD implanted by [facility] for primary prevention b. gen change 03/2014    LEAD REVISION/REPAIR N/A 07/17/2016   Procedure: Lead Revision/Repair;  Surgeon: Evans Lance, MD;  Location: [facility];  Service: Cardiovascular;  Laterality: N/A;   LEFT HEART CATHETERIZATION WITH CORONARY ANGIOGRAM N/A 05/27/2014   Procedure: LEFT HEART CATHETERIZATION WITH CORONARY ANGIOGRAM;  Surgeon: Sherren Mocha, MD;  Location: [facility] CATH LAB;  Service: Cardiovascular;  Laterality: N/A;   REPAIR KNEE LIGAMENT       reports that he has been smoking cigars. He has never used smokeless tobacco. He reports that he does not drink alcohol and does not use drugs.  No Known Allergies  Family History  Problem Relation Age of Onset   Cirrhosis Mother        due to ETOH  Cancer Neg Hx     Prior to Admission medications   Medication Sig Start Date End Date Taking? Authorizing Provider  acetaminophen (TYLENOL) 500 MG tablet Take 500-1,000 mg by mouth every 8 (eight) hours as needed (for pain).    Yes [provider]  amiodarone (PACERONE) 200 MG tablet Take 1 tablet (200 mg total) by mouth daily. 07/12/20  Yes Allred, Jeneen Rinks, MD  atorvastatin (LIPITOR) 80 MG tablet TAKE 1 TABLET BY MOUTH  DAILY AT 6 PM Patient taking differently: Take 80 mg by mouth every evening. 04/19/20  Yes Lelon Perla, MD  bismuth subsalicylate (PEPTO BISMOL) 262 MG/15ML  suspension Take 30 mLs by mouth every 6 (six) hours as needed for indigestion or diarrhea or loose stools.   Yes [provider]  carvedilol (COREG) 25 MG tablet Take 1 tablet (25 mg total) by mouth 2 (two) times daily with a meal. 04/19/20  Yes Allred, Jeneen Rinks, MD  Famotidine (PEPCID AC PO) Take 1 tablet by mouth 2 (two) times daily as needed (acid reflux).   Yes [provider]  feeding supplement (ENSURE ENLIVE / ENSURE PLUS) LIQD Try to drink 1 can 1-2 times a day. Patient taking differently: Take 237 mLs by mouth in the morning and at bedtime. strawberry 06/04/20  Yes Weaver, Scott T, PA-C  Fluticasone-Salmeterol (ADVAIR) 100-50 MCG/DOSE AEPB Inhale 1 puff into the lungs 2 (two) times daily. 01/07/20  Yes Shirley Friar, PA-C  furosemide (LASIX) 40 MG tablet Take 0.5 tablets (20 mg total) by mouth daily. 04/19/20  Yes Allred, Jeneen Rinks, MD  hydrALAZINE (APRESOLINE) 25 MG tablet Take 1 tablet (25 mg total) by mouth 3 (three) times daily. 11/17/19 10/11/20 Yes Tillery, Satira Mccallum, PA-C  isosorbide mononitrate (IMDUR) 30 MG 24 hr tablet Take 0.5 tablets (15 mg total) by mouth daily. 11/17/19  Yes Shirley Friar, PA-C  Multiple Vitamin (MULTIVITAMIN) capsule Take 1 capsule by mouth daily.   Yes [provider]  nitroGLYCERIN (NITROSTAT) 0.4 MG SL tablet Place 1 tablet (0.4 mg total) under the tongue every 5 (five) minutes x 3 doses as needed for chest pain. 06/04/20 06/04/21 Yes Weaver, Scott T, PA-C  Omega-3 Fatty Acids (FISH OIL) 1200 MG CAPS Take 1,200 mg by mouth daily.   Yes [provider]  potassium chloride SA (KLOR-CON) 20 MEQ tablet TAKE 1 TABLET BY MOUTH  DAILY Patient taking differently: Take 20 mEq by mouth daily. 02/24/20  Yes Lelon Perla, MD  Pseudoeph-Doxylamine-DM-APAP (NYQUIL PO) Take 1 Dose by mouth at bedtime as needed (sleep). Heart healty nyquil   Yes [provider]  rivaroxaban (XARELTO) 20 MG TABS tablet Take 1 tablet (20  mg total) by mouth daily with supper. Patient taking differently: Take 20 mg by mouth at bedtime. 03/31/20  Yes Baldwin Jamaica, PA-C  sacubitril-valsartan (ENTRESTO) 49-51 MG Take 1 tablet by mouth 2 (two) times daily. 03/31/20  Yes Baldwin Jamaica, PA-C  spironolactone (ALDACTONE) 25 MG tablet Take 1 tablet (25 mg total) by mouth at bedtime. 01/07/20  Yes Shirley Friar, PA-C  mometasone-formoterol (DULERA) 100-5 MCG/ACT AERO Inhale 2 puffs into the lungs in the morning and at bedtime. Patient not taking: Reported on 10/11/2020 07/12/20   Thompson Grayer, MD    Physical Exam: Constitutional: Moderately built and nourished. Vitals:   10/11/20 0015 10/11/20 0030 10/11/20 0045 10/11/20 0130  BP: (!) 151/81 (!) 146/63 (!) 159/58 (!) 148/71  Pulse: (!) 58 (!) 59 60 (!) 59  Resp: Marland Kitchen)  26 (!) 26 (!) 21 (!) 22  Temp:      TempSrc:      SpO2: 93% 94% 95% 93%  Weight:      Height:       Eyes: Anicteric no pallor. ENMT: No discharge from the ears eyes nose and mouth. Neck: No mass felt.  No neck rigidity. Respiratory: No rhonchi or crepitations. Cardiovascular: S1-S2 heard. Abdomen: Soft nontender bowel sound present. Musculoskeletal: No edema. Skin: No rash. Neurologic: Alert awake oriented time place and person.  Moves all extremities. Psychiatric: Appears normal.  Normal affect.   Labs on Admission: I have personally reviewed following labs and imaging studies  CBC: Recent Labs  Lab 10/10/20 2023  WBC 11.7*  NEUTROABS 8.5*  HGB 12.9*  HCT 39.5  MCV 96.3  PLT 123456   Basic Metabolic Panel: Recent Labs  Lab 10/10/20 2023  NA 137  K 4.3  CL 103  CO2 26  GLUCOSE 104*  BUN 13  CREATININE 1.19  CALCIUM 9.3   GFR: Estimated Creatinine Clearance: 46.3 mL/min (by C-G formula based on SCr of 1.19 mg/dL). Liver Function Tests: Recent Labs  Lab 10/10/20 2023  AST 58*  ALT 77*  ALKPHOS 70  BILITOT 0.7  PROT 7.1  ALBUMIN 3.4*   Recent Labs  Lab 10/10/20 2023   LIPASE 33   No results for input(s): AMMONIA in the last 168 hours. Coagulation Profile: No results for input(s): INR, PROTIME in the last 168 hours. Cardiac Enzymes: No results for input(s): CKTOTAL, CKMB, CKMBINDEX, TROPONINI in the last 168 hours. BNP (last 3 results) Recent Labs    07/12/20 1017  PROBNP 309   HbA1C: No results for input(s): HGBA1C in the last 72 hours. CBG: No results for input(s): GLUCAP in the last 168 hours. Lipid Profile: No results for input(s): CHOL, HDL, LDLCALC, TRIG, CHOLHDL, LDLDIRECT in the last 72 hours. Thyroid Function Tests: No results for input(s): TSH, T4TOTAL, FREET4, T3FREE, THYROIDAB in the last 72 hours. Anemia Panel: No results for input(s): VITAMINB12, FOLATE, FERRITIN, TIBC, IRON, RETICCTPCT in the last 72 hours. Urine analysis:    Component Value Date/Time   COLORURINE STRAW (A) 02/25/2017 0926   APPEARANCEUR CLEAR 02/25/2017 0926   LABSPEC 1.003 (L) 02/25/2017 0926   PHURINE 5.0 02/25/2017 0926   GLUCOSEU NEGATIVE 02/25/2017 0926   GLUCOSEU NEGATIVE 12/09/2016 1100   HGBUR MODERATE (A) 02/25/2017 0926   BILIRUBINUR NEGATIVE 02/25/2017 0926   KETONESUR NEGATIVE 02/25/2017 0926   PROTEINUR NEGATIVE 02/25/2017 0926   UROBILINOGEN 0.2 12/09/2016 1100   NITRITE NEGATIVE 02/25/2017 0926   LEUKOCYTESUR NEGATIVE 02/25/2017 0926   Sepsis Labs: '@LABRCNTIP'$ (procalcitonin:4,lacticidven:4) ) Recent Results (from the past 240 hour(s))  Resp Panel by RT-PCR (Flu A&B, Covid) Nasopharyngeal Swab     Status: None   Collection Time: 10/10/20  8:56 PM   Specimen: Nasopharyngeal Swab; Nasopharyngeal(NP) swabs in vial transport medium  Result Value Ref Range Status   SARS Coronavirus 2 by RT PCR NEGATIVE NEGATIVE Final    Comment: (NOTE) SARS-CoV-2 target nucleic acids are NOT DETECTED.  The SARS-CoV-2 RNA is generally detectable in upper respiratory specimens during the acute phase of infection. The lowest concentration of SARS-CoV-2  viral copies this assay can detect is 138 copies/mL. A negative result does not preclude SARS-Cov-2 infection and should not be used as the sole basis for treatment or other patient management decisions. A negative result may occur with  improper specimen collection/handling, submission of specimen other than nasopharyngeal swab, presence of  viral mutation(s) within the areas targeted by this assay, and inadequate number of viral copies(<138 copies/mL). A negative result must be combined with clinical observations, patient history, and epidemiological information. The expected result is Negative.  Fact Sheet for Patients:  EntrepreneurPulse.com.au  Fact Sheet for Healthcare Providers:  IncredibleEmployment.be  This test is no t yet approved or cleared by the [place] FDA and  has been authorized for detection and/or diagnosis of SARS-CoV-2 by FDA under an Emergency Use Authorization (EUA). This EUA will remain  in effect (meaning this test can be used) for the duration of the COVID-19 declaration under Section 564(b)(1) of the Act, 21 U.S.C.section 360bbb-3(b)(1), unless the authorization is terminated  or revoked sooner.       Influenza A by PCR NEGATIVE NEGATIVE Final   Influenza B by PCR NEGATIVE NEGATIVE Final    Comment: (NOTE) The Xpert Xpress SARS-CoV-2/FLU/RSV plus assay is intended as an aid in the diagnosis of influenza from Nasopharyngeal swab specimens and should not be used as a sole basis for treatment. Nasal washings and aspirates are unacceptable for Xpert Xpress SARS-CoV-2/FLU/RSV testing.  Fact Sheet for Patients: EntrepreneurPulse.com.au  Fact Sheet for Healthcare Providers: IncredibleEmployment.be  This test is not yet approved or cleared by the [place] FDA and has been authorized for detection and/or diagnosis of SARS-CoV-2 by FDA under an Emergency Use Authorization  (EUA). This EUA will remain in effect (meaning this test can be used) for the duration of the COVID-19 declaration under Section 564(b)(1) of the Act, 21 U.S.C. section 360bbb-3(b)(1), unless the authorization is terminated or revoked.  Performed at [facility], [facility] [address] [place], [place] [address]      Radiological Exams on Admission: CT ABDOMEN PELVIS W CONTRAST  Result Date: 10/10/2020 CLINICAL DATA:  Abdominal pain, acute, nonlocalized EXAM: CT ABDOMEN AND PELVIS WITH CONTRAST TECHNIQUE: Multidetector CT imaging of the abdomen and pelvis was performed using the standard protocol following bolus administration of intravenous contrast. CONTRAST:  100 cc Omnipaque 300 COMPARISON:  04/04/2017 FINDINGS: Lower chest: There is stable left lower lobe bronchiectasis and mucoid impaction with associated parenchymal scarring and volume loss. No pleural effusion. Pacemaker leads are seen within the right heart. Left ventricular dilation is noted with stable thinning of the septal and apical wall in keeping with prior myocardial infarction. Trace pericardial effusion. Hepatobiliary: No focal liver abnormality is seen. No gallstones, gallbladder wall thickening, or biliary dilatation. Pancreas: Unremarkable Spleen: Unremarkable Adrenals/Urinary Tract: Stable 3.3 x 4.4 cm heterogeneously enhancing right adrenal mass, stable since remote prior examination of 10/14/2003, likely representing a benign adrenal adenoma or myelolipoma. Left adrenal gland is unremarkable. The kidneys are normal in size and position. Scattered simple cortical cysts are seen within the kidneys bilaterally. The kidneys are otherwise unremarkable. The prostate gland is centrally enlarged and protrudes into the bladder lumen. The bladder is not distended. Stomach/Bowel: Moderate sigmoid diverticulosis. Unremarkable loops of small bowel are seen within small bilateral inguinal hernias. The stomach, small bowel, and large bowel are  otherwise unremarkable. Appendix normal. No free intraperitoneal gas or fluid. Vascular/Lymphatic: Short segment focal dissection of the a suprarenal aorta is again identified with a patent false lumen noted posteriorly. No evidence of rupture or periaortic inflammatory change. Infrarenal abdominal aortic aneurysm has undergone endovascular repair utilizing a bifurcated stent graft demonstrating infrarenal proximal fixation and bilateral common iliac distal landing zones. Small mural thrombus is again identified within the a left common iliac limb, unchanged, without evidence of hemodynamically significant  stenosis. The aneurysm sac is unchanged measuring 4.7 x 5.0 cm at axial image # 36/3. Extensive atherosclerotic calcification noted within the a mesenteric and renal vasculature, however, the degree of stenosis is not well assessed on this examination. 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 No pathologic adenopathy within the abdomen and pelvis. Reproductive: There is marked prostatic enlargement, particularly centrally which protrudes into the bladder lumen. This appears similar to prior examination. Other: Small bilateral inguinal hernias are present. Rectum unremarkable. Musculoskeletal: Degenerative changes are seen within the lumbar spine. No lytic or blastic bone lesions are identified. IMPRESSION: No acute intra-abdominal pathology identified. No definite radiographic explanation for the patient's reported symptoms. Stable left basilar bronchiectasis with superimposed mucoid impaction and volume loss. Stable 4.4 cm right adrenal mass since remote prior examination of 10/14/2003 likely representing a benign adrenal adenoma or myelolipoma. Moderate sigmoid diverticulosis. Stable appearance of the abdominal aorta demonstrating a short segment focal dissection within the suprarenal segment, endovascular repair of a abdominal aortic aneurysm measuring 5.0 cm in greatest dimension, stable,  as well as 2.2 cm right common iliac artery aneurysm, unchanged. Marked prostatic enlargement without evidence of bladder outlet obstruction. Small bilateral inguinal hernias containing small bowel without evidence of obstruction. Aortic aneurysm NOS (ICD10-I71.9). Aortic Atherosclerosis (ICD10-I70.0). Electronically Signed   By: Fidela Salisbury M.D.   On: 10/10/2020 23:21   DG Chest Portable 1 View  Result Date: 10/10/2020 CLINICAL DATA:  Weakness.  Abdominal pain and vomiting. EXAM: PORTABLE CHEST 1 VIEW COMPARISON:  06/01/2020, remote CT 06/03/2007 FINDINGS: Multi lead left-sided pacemaker in place. The heart is normal in size. Aortic atherosclerosis. The lungs are hyperinflated. Central bronchial thickening. No emphysema seen on prior CT. Streaky left lung base scarring. No acute airspace disease. No pulmonary edema. No pleural effusion or pneumothorax. No visualized pulmonary mass or nodule. No acute osseous abnormalities are seen. IMPRESSION: Hyperinflation and central bronchial thickening, imaging findings suggesting COPD. No focal airspace disease. Electronically Signed   By: Keith Rake M.D.   On: 10/10/2020 21:19    EKG: Independently reviewed.  Initial EKG shows V. tach before cardioversion.  Assessment/Plan Principal Problem:   V tach ([place]) Active Problems:   CAD (coronary artery disease)   Cardiomyopathy, ischemic   Aneurysm of abdominal vessel (5.6 CM)   HFrEF (heart failure with reduced ejection fraction) ([place])   ICD (implantable cardioverter-defibrillator) in place   Essential hypertension   Paroxysmal atrial fibrillation ([name])   Abdominal pain    V. Tach -patient was cardioverted by cardiologist.  Presently on amiodarone infusion.  Patient is also on Coreg.  Closely follow metabolic panel magnesium levels. Abdominal pain with nausea vomiting happens more often after eating food.  CT abdomen pelvis is unremarkable.  LFTs are mildly elevated.  Will consult GI in the  morning. Mildly elevated LFTs.  Follow liver function test and check acute hepatitis panel. History of A. fib presently on amiodarone infusion also takes Xarelto and Coreg. History of systolic heart failure cardiologist recommended decreasing the dose of Entresto by half and holding Imdur hydralazine and spironolactone.  Appears compensated. Chronic anemia follow CBC. History of aneurysm of the abdominal vessel status post EVAR. CAD denies any chest pain.  Continue Coreg Xarelto and statins.   DVT prophylaxis: Xarelto. Code Status: Full code. Family Communication: Patient's wife. Disposition Plan: Home. Consults called: Cardiology. Admission status: Observation.   Rise Patience MD [facility] Pager [phone].  If 7PM-7AM, please contact night-coverage [email or web address] Password [email or web address]  10/11/2020, 2:05 AM

## 2020-10-11 NOTE — Significant Event (Addendum)
Paged for recurrent VT after amiodarone decreased from 60 to 30 mg/min. Appears similar to presenting episode at rate of 140. BP 90s/70s. Cycle length again 420-430, ATP attempted multiple times down to cycle length of 360 without success. I don't think he will tolerate procainamide or propranolol. Will try amiodarone bolus then lidocaine. If not successful, will internally shock again. ED to provide sedation. Will need upgrade to ICU level of care. Entresto also held and carvedilol dose decreased (not yet received).   Successfully cardioverted with lidocaine and (partial) amiodarone bolus. Will add lidocaine infusion as well. If he has recurrent VT, can rebolus lidocaine '100mg'$  + amiodarone '150mg'$  and notify cardiology on-call. Externally cardiovert if acutely unstable per ACLS.

## 2020-10-11 NOTE — Progress Notes (Signed)
ANTICOAGULATION CONSULT NOTE  Pharmacy Consult for heparin Indication: atrial fibrillation  No Known Allergies  Patient Measurements: Height: '5\' 10"'$  (177.8 cm) Weight: 63 kg (138 lb 14.2 oz) IBW/kg (Calculated) : 73 Heparin Dosing Weight: 63kg  Vital Signs: Temp: 98.1 F (36.7 C) (09/07 0954) Temp Source: Oral (09/07 0954) BP: 133/77 (09/07 0954) Pulse Rate: 60 (09/07 0954)  Labs: Recent Labs    10/10/20 2023  HGB 12.9*  HCT 39.5  PLT 260  CREATININE 1.19    Estimated Creatinine Clearance: 46.3 mL/min (by C-G formula based on SCr of 1.19 mg/dL).   Medical History: Past Medical History:  Diagnosis Date   Adrenal mass ([place])    per pt this is remote (10 years) and benign by biopsy   AICD (automatic cardioverter/defibrillator) present    Anxiety    Arthritis    CAD (coronary artery disease)    a. s/p PCI to LAD 2001 b. myoview 2014 high risk with scar LAD/RCA territory but no ischemia   Cardiomyopathy, ischemic    CHF (congestive heart failure) ([name])    class II/III   COPD (chronic obstructive pulmonary disease) ([place])    smokes cigars but has quit cigarettes   Defibrillator discharge 04/21/2019   Diverticulitis    Dyspnea    Flu 03/2015   HTN (hypertension)    Hyperlipidemia    NSVT (nonsustained ventricular tachycardia) ([place]) 03/05/2017   Paroxysmal atrial fibrillation ([place]) 03/2015   chads2vasc score is 4   PSA (psoriatic arthritis) ([name])    increased   PVD (peripheral vascular disease) ([name])     Assessment: 36 yoM with hx AF admitted with VT. Pt on Xarelto at home (last dose 9/5 pm), pharmacy to hold and switch to heparin with need for cath. CBC stable overnight on admit.  Goal of Therapy:  Heparin level 0.3-0.7 units/ml aPTT 66-102 seconds Monitor platelets by anticoagulation protocol: Yes   Plan:  Hold Xarelto Start heparin 900 units/h Check 8h aPTT Daily heparin level, aPTT, CBC  Arrie Senate, PharmD, BCPS, [facility] Clinical  Pharmacist [phone] Please check AMION for all [facility] numbers 10/11/2020

## 2020-10-11 NOTE — ED Notes (Addendum)
Pt in vtach rhythm (135-140bpm), asymptomatic, EKG captured, zole connected to pt, MD paged.

## 2020-10-11 NOTE — Progress Notes (Signed)
  Echocardiogram 2D Echocardiogram has been performed.  Benjamin Cook 10/11/2020, 2:56 PM

## 2020-10-11 NOTE — ED Notes (Signed)
Pt converted to sinus rhythm (60bpm, RRR) post amiodarone bolus

## 2020-10-11 NOTE — Plan of Care (Signed)

## 2020-10-11 NOTE — Consult Note (Addendum)
Cardiology Consultation:   Patient ID: Benjamin Cook MRN: [id number]; DOB: 09/26/1945  Admit date: 10/10/2020 Date of Consult: 10/11/2020  PCP:  Merryl Hacker No   [facility] Providers Cardiologist:  Benjamin Ruths, MD  EP: Benjamin Cook    Patient Profile:   Benjamin Cook is a 75 y.o. male with a hx of CAD (s/p PCI to LAD 2001), ICM, chronic CHF (systolic), ICD, HTN, HLD, VT, AFib, AAA (s/p endovascular repair, Benjamin Cook), COPD, VT who is being seen 10/11/2020 for the evaluation of VT at the request of Benjamin Cook.   Device information SJM dual chamber ICD,  Implanted 2012, gen change 03/18/2014 Has an abandoned RV lead (fracture), new lead placed 2018 Known A lead noise  04/21/2019 @ 9:31PM VT episode This was a true MMVT, 203bpm, one ATP delivered failed, , one HV therapy, 25J with clean break ATP therapies were added 10/30/19: VT w/HV tx (pt preferred to hold off AAD), stress with only peri-infarct ischemia, not felt to need cath  06/01/20: VT storm 74 ATP  Therapies and 14 HV therapies All appropriate All were MMVT episodes, but did have two different VT's Largely ATP failed though a couple did work Also a couple ATPs that slowed his VT to below detection and labeled return to sinus AMIODARONE started Additional VT zone added 166bpm (With no anginal symptoms and recent stable stress test cath not pursued  History of Present Illness:   Benjamin Cook was admitted to [facility] April 29/2022 with VT storm, he had no anginal symptoms and a fairly recent stable stress test with large scar/peri-infarct ischemia, LVEF 22%, and known hx of VT, cath not pursued and started on amiodarone. Discharged 06/04/20  He saw Benjamin Cook 07/12/20, generally fatigued, poor exertional capacity, felt NYHA class III though BP limiting medicine titration.  Discussed possible BAT though planned to revisit at next visit.  No VT noted, updated labs for his amio   He came to [place] evening with abdominal  complaints of N/V/discomfort, reported for a few weeks with reduced PO intake, acutely worse the last couple days.  No palpitations, perhaps some degree of chest pressure.  Mentioned lightheaded after he takes his hydralazine He arrived in a WCT c/w VT, cardiology was consulted Patient was feeling somewhat better, though had ongoing VT, given amiodarone bolus without change, via his device attempts at ATP were unsuccessful and was cardioverted w/40J Amio gtt started, with softer BPs hydralazine, Imdur, aldactone held  He had recurrent VT early this AM 140's, again via his device multiple ATPs attempted and failed , given another amio bolus and started on lidocaine, with subsequent conversion to SR. Not felt to be acutely volume OL Device interrogation noted no arrhythmias, had no therapies planned for EP consult  LABS K+ 4.3 BUN/Creat 13/1.19 (baseline looks 0.9, last out pt though 1.23) AST 58 ALT 77 WBC 11.7 H/H 12/39 Plts 260   He reports poor exertional capacity unable to do too much for about a year without getting SOB.  This he says is unchanged for him. He reports that the pill he takes 3x day makes him feel poorly and a bit sick to his stomach (hydralazine?) This week he started to feel particularly poor. Episodes of some fullness in his chest, making him feel weak and a little clammy, this would last a few to several minutes and once this stopped he had nausea, and low belly discomfort. Always in this order, and with increasing frequency. He denies overt CP, no  rest SOB No syncope  He has not had these symptoms here despite a few prolonged episodes of VT    Past Medical History:  Diagnosis Date   Adrenal mass ([place])    per pt this is remote (10 years) and benign by biopsy   AICD (automatic cardioverter/defibrillator) present    Anxiety    Arthritis    CAD (coronary artery disease)    a. s/p PCI to LAD 2001 b. myoview 2014 high risk with scar LAD/RCA territory but no  ischemia   Cardiomyopathy, ischemic    CHF (congestive heart failure) ([name])    class II/III   COPD (chronic obstructive pulmonary disease) ([place])    smokes cigars but has quit cigarettes   Defibrillator discharge 04/21/2019   Diverticulitis    Dyspnea    Flu 03/2015   HTN (hypertension)    Hyperlipidemia    NSVT (nonsustained ventricular tachycardia) ([place]) 03/05/2017   Paroxysmal atrial fibrillation ([place]) 03/2015   chads2vasc score is 4   PSA (psoriatic arthritis) ([name])    increased   PVD (peripheral vascular disease) ([place])     Past Surgical History:  Procedure Laterality Date   ABDOMINAL AORTIC ENDOVASCULAR STENT GRAFT N/A 03/03/2017   Procedure: ABDOMINAL AORTIC ENDOVASCULAR STENT GRAFT;  Surgeon: Waynetta Sandy, MD;  Location: [place];  Service: Vascular;  Laterality: N/A;   cataract surgery     CORONARY ANGIOPLASTY WITH STENT PLACEMENT  2001   a. PCI to LAD   IMPLANTABLE CARDIOVERTER DEFIBRILLATOR (ICD) GENERATOR CHANGE N/A 03/18/2014   a. SJM Fortify ST DR ICD implanted by [facility] for primary prevention b. gen change 03/2014    LEAD REVISION/REPAIR N/A 07/17/2016   Procedure: Lead Revision/Repair;  Surgeon: Evans Lance, MD;  Location: [place] CV [facility];  Service: Cardiovascular;  Laterality: N/A;   LEFT HEART CATHETERIZATION WITH CORONARY ANGIOGRAM N/A 05/27/2014   Procedure: LEFT HEART CATHETERIZATION WITH CORONARY ANGIOGRAM;  Surgeon: Sherren Mocha, MD;  Location: [facility] C[facility];  Service: Cardiovascular;  Laterality: N/A;   REPAIR KNEE LIGAMENT       Home Medications:  Prior to Admission medications   Medication Sig Start Date End Date Taking? Authorizing Provider  acetaminophen (TYLENOL) 500 MG tablet Take 500-1,000 mg by mouth every 8 (eight) hours as needed (for pain).    Yes [provider]  amiodarone (PACERONE) 200 MG tablet Take 1 tablet (200 mg total) by mouth daily. 07/12/20  Yes Allred, Jeneen Rinks, MD  atorvastatin (LIPITOR) 80 MG tablet TAKE 1 TABLET BY  MOUTH  DAILY AT 6 PM Patient taking differently: Take 80 mg by mouth every evening. 04/19/20  Yes Lelon Perla, MD  bismuth subsalicylate (PEPTO BISMOL) 262 MG/15ML suspension Take 30 mLs by mouth every 6 (six) hours as needed for indigestion or diarrhea or loose stools.   Yes [provider]  carvedilol (COREG) 25 MG tablet Take 1 tablet (25 mg total) by mouth 2 (two) times daily with a meal. 04/19/20  Yes Allred, Jeneen Rinks, MD  Famotidine (PEPCID AC PO) Take 1 tablet by mouth 2 (two) times daily as needed (acid reflux).   Yes [provider]  feeding supplement (ENSURE ENLIVE / ENSURE PLUS) LIQD Try to drink 1 can 1-2 times a day. Patient taking differently: Take 237 mLs by mouth in the morning and at bedtime. strawberry 06/04/20  Yes Weaver, Scott T, PA-C  Fluticasone-Salmeterol (ADVAIR) 100-50 MCG/DOSE AEPB Inhale 1 puff into the lungs 2 (two) times daily. 01/07/20  Yes Barrington Ellison  Mitzi Hansen, PA-C  furosemide (LASIX) 40 MG tablet Take 0.5 tablets (20 mg total) by mouth daily. 04/19/20  Yes Allred, Jeneen Rinks, MD  hydrALAZINE (APRESOLINE) 25 MG tablet Take 1 tablet (25 mg total) by mouth 3 (three) times daily. 11/17/19 10/11/20 Yes Tillery, Satira Mccallum, PA-C  isosorbide mononitrate (IMDUR) 30 MG 24 hr tablet Take 0.5 tablets (15 mg total) by mouth daily. 11/17/19  Yes Shirley Friar, PA-C  Multiple Vitamin (MULTIVITAMIN) capsule Take 1 capsule by mouth daily.   Yes [provider]  nitroGLYCERIN (NITROSTAT) 0.4 MG SL tablet Place 1 tablet (0.4 mg total) under the tongue every 5 (five) minutes x 3 doses as needed for chest pain. 06/04/20 06/04/21 Yes Weaver, Scott T, PA-C  Omega-3 Fatty Acids (FISH OIL) 1200 MG CAPS Take 1,200 mg by mouth daily.   Yes [provider]  potassium chloride SA (KLOR-CON) 20 MEQ tablet TAKE 1 TABLET BY MOUTH  DAILY Patient taking differently: Take 20 mEq by mouth daily. 02/24/20  Yes Lelon Perla, MD  Pseudoeph-Doxylamine-DM-APAP  (NYQUIL PO) Take 1 Dose by mouth at bedtime as needed (sleep). Heart healty nyquil   Yes [provider]  rivaroxaban (XARELTO) 20 MG TABS tablet Take 1 tablet (20 mg total) by mouth daily with supper. Patient taking differently: Take 20 mg by mouth at bedtime. 03/31/20  Yes Baldwin Jamaica, PA-C  sacubitril-valsartan (ENTRESTO) 49-51 MG Take 1 tablet by mouth 2 (two) times daily. 03/31/20  Yes Baldwin Jamaica, PA-C  spironolactone (ALDACTONE) 25 MG tablet Take 1 tablet (25 mg total) by mouth at bedtime. 01/07/20  Yes Shirley Friar, PA-C  mometasone-formoterol (DULERA) 100-5 MCG/ACT AERO Inhale 2 puffs into the lungs in the morning and at bedtime. Patient not taking: Reported on 10/11/2020 07/12/20   Thompson Grayer, MD    Inpatient Medications: Scheduled Meds:  atorvastatin  80 mg Oral QPM   carvedilol  6.25 mg Oral BID WC   mometasone-formoterol  2 puff Inhalation BID   omega-3 acid ethyl esters  1 g Oral Daily   pantoprazole (PROTONIX) IV  40 mg Intravenous Q12H   potassium chloride SA  20 mEq Oral Daily   rivaroxaban  20 mg Oral Q supper   sacubitril-valsartan  1 tablet Oral BID   Continuous Infusions:  amiodarone 60 mg/hr (10/11/20 0617)   lidocaine 1 mg/min (10/11/20 0528)   PRN Meds: feeding supplement, nitroGLYCERIN  Allergies:   No Known Allergies  Social History:   Social History   Socioeconomic History   Marital status: Married    Spouse name: Not on file   Number of children: Not on file   Years of education: Not on file   Highest education level: Not on file  Occupational History   Occupation: [employer]  Tobacco Use   Smoking status: Every Day    Types: Cigars   Smokeless tobacco: Never   Tobacco comments:    smokes cigars now, no longer smokes cigarettes but previously smoked 2ppd  Vaping Use   Vaping Use: Never used  Substance and Sexual Activity   Alcohol use: No   Drug use: No   Sexual activity: Not Currently    Partners: Female     Birth control/protection: None  Other Topics Concern   Not on file  Social History Narrative   Not on file   Social Determinants of Health   Financial Resource Strain: Not on file  Food Insecurity: Not on file  Transportation Needs: Not on file  Physical Activity: Not on file  Stress: Not on file  Social Connections: Not on file  Intimate Partner Violence: Not on file    Family History:   Family History  Problem Relation Age of Onset   Cirrhosis Mother        due to ETOH   Cancer Neg Hx      ROS:  Please see the history of present illness.  All other ROS reviewed and negative.     Physical Exam/Data:   Vitals:   10/11/20 0639 10/11/20 0644 10/11/20 0645 10/11/20 0700  BP:   (!) 144/72 (!) 145/70  Pulse: 61 60 60 (!) 59  Resp: (!) '21 17 20 16  '$ Temp:      TempSrc:      SpO2: 91% 93% 93% 93%  Weight:      Height:       No intake or output data in the 24 hours ending 10/11/20 0721 Last 3 Weights 10/10/2020 07/12/2020 06/04/2020  Weight (lbs) 138 lb 14.2 oz 140 lb 141 lb 5 oz  Weight (kg) 63 kg 63.504 kg 64.1 kg     Body mass index is 19.93 kg/m.  General:  Well nourished, well developed, in no acute distress HEENT: asymmetric eyes, L eye with some bulging/lid droop he reports as his usual, otherwise normal Lymph: no adenopathy Neck: no JVD Endocrine:  No thryomegaly Vascular: No carotid bruits  Cardiac:  RRR;, soft SM, no gallops or rubs Lungs:  CTA b/l, no wheezing, rhonchi or rales  Abd: soft, nontender  Ext: no edema Musculoskeletal:  No deformities Skin: warm and dry  Neuro:  no focal abnormalities noted Psych:  Normal affect   EKG:  The EKG was personally reviewed and demonstrates:    #1 SR 68bpm, , borderline 1st degree AVblock, RBBB, LAD VT 138bpm, RBBB, LAD  Telemetry:  Telemetry was personally reviewed and demonstrates:   SR 60's in/out of VT rates 130's  Relevant CV Studies:  11/23/2019: stress myoview Nuclear stress EF: 22%. No T wave  inversion was noted during stress. There was no ST segment deviation noted during stress. Defect 1: There is a large defect of severe severity present in the basal inferior, mid anteroseptal, mid inferoseptal, mid inferior, apical septal, apical inferior, apical lateral and apex location. Findings consistent with prior myocardial infarction with peri-infarct ischemia. This is a high risk study. The left ventricular ejection fraction is severely decreased (<30%).   Large size, severe severity mostly fixed (SDS 3) apical to mid inferior, apical, apical septal and anteroapical perfusion defect suggestive of mid to distal LAD and RCA territory scar with minimal peri-infarct ischemia. LVEF 22% with akinesis of the previously mentioned walls. This is a high risk study. Compared to a prior study on 12/21/2012, there is no significant change.       04/09/2019; TTE IMPRESSIONS   1. Left ventricular ejection fraction, by estimation, is 35%%. The left  ventricle has moderate to severely decreased function. There is mild left  ventricular hypertrophy. Left ventricular diastolic parameters are  consistent with Grade I diastolic  dysfunction (impaired relaxation).   2. Right ventricular systolic function is normal. The right ventricular  size is normal.   3. The mitral valve is normal in structure and function. Trivial mitral  valve regurgitation.   4. The aortic valve is abnormal. Aortic valve regurgitation is trivial.  Mild aortic valve sclerosis is present, with no evidence of aortic valve  stenosis.   5. The inferior vena cava  is normal in size with greater than 50%  respiratory variability, suggesting right atrial pressure of 3 mmHg.     Laboratory Data:  High Sensitivity Troponin:  No results for input(s): TROPONINIHS in the last 720 hours.   Chemistry Recent Labs  Lab 10/10/20 2023  NA 137  K 4.3  CL 103  CO2 26  GLUCOSE 104*  BUN 13  CREATININE 1.19  CALCIUM 9.3  GFRNONAA >60   ANIONGAP 8    Recent Labs  Lab 10/10/20 2023  PROT 7.1  ALBUMIN 3.4*  AST 58*  ALT 77*  ALKPHOS 70  BILITOT 0.7   Hematology Recent Labs  Lab 10/10/20 2023  WBC 11.7*  RBC 4.10*  HGB 12.9*  HCT 39.5  MCV 96.3  MCH 31.5  MCHC 32.7  RDW 15.6*  PLT 260   BNPNo results for input(s): BNP, PROBNP in the last 168 hours.  DDimer No results for input(s): DDIMER in the last 168 hours.   Radiology/Studies:  CT ABDOMEN PELVIS W CONTRAST Result Date: 10/10/2020 CLINICAL DATA:  Abdominal pain, acute, nonlocalized EXAM: CT ABDOMEN AND PELVIS WITH CONTRAST TECHNIQUE: Multidetector CT imaging of the abdomen and pelvis was performed using the standard protocol following bolus administration of intravenous contrast. CONTRAST:  100 cc Omnipaque 300 COMPARISON:  04/04/2017 FINDINGS: Lower chest: There is stable left lower lobe bronchiectasis and mucoid impaction with associated parenchymal scarring and volume loss. No pleural effusion. Pacemaker leads are seen within the right heart. Left ventricular dilation is noted with stable thinning of the septal and apical wall in keeping with prior myocardial infarction. Trace pericardial effusion. Hepatobiliary: No focal liver abnormality is seen. No gallstones, gallbladder wall thickening, or biliary dilatation. Pancreas: Unremarkable Spleen: Unremarkable Adrenals/Urinary Tract: Stable 3.3 x 4.4 cm heterogeneously enhancing right adrenal mass, stable since remote prior examination of 10/14/2003, likely representing a benign adrenal adenoma or myelolipoma. Left adrenal gland is unremarkable. The kidneys are normal in size and position. Scattered simple cortical cysts are seen within the kidneys bilaterally. The kidneys are otherwise unremarkable. The prostate gland is centrally enlarged and protrudes into the bladder lumen. The bladder is not distended. Stomach/Bowel: Moderate sigmoid diverticulosis. Unremarkable loops of small bowel are seen within small  bilateral inguinal hernias. The stomach, small bowel, and large bowel are otherwise unremarkable. Appendix normal. No free intraperitoneal gas or fluid. Vascular/Lymphatic: Short segment focal dissection of the a suprarenal aorta is again identified with a patent false lumen noted posteriorly. No evidence of rupture or periaortic inflammatory change. Infrarenal abdominal aortic aneurysm has undergone endovascular repair utilizing a bifurcated stent graft demonstrating infrarenal proximal fixation and bilateral common iliac distal landing zones. Small mural thrombus is again identified within the a left common iliac limb, unchanged, without evidence of hemodynamically significant stenosis. The aneurysm sac is unchanged measuring 4.7 x 5.0 cm at axial image # 36/3. Extensive atherosclerotic calcification noted within the a mesenteric and renal vasculature, however, the degree of stenosis is not well assessed on this examination. 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 No pathologic adenopathy within the abdomen and pelvis. Reproductive: There is marked prostatic enlargement, particularly centrally which protrudes into the bladder lumen. This appears similar to prior examination. Other: Small bilateral inguinal hernias are present. Rectum unremarkable. Musculoskeletal: Degenerative changes are seen within the lumbar spine. No lytic or blastic bone lesions are identified. IMPRESSION: No acute intra-abdominal pathology identified. No definite radiographic explanation for the patient's reported symptoms. Stable left basilar bronchiectasis with superimposed mucoid  impaction and volume loss. Stable 4.4 cm right adrenal mass since remote prior examination of 10/14/2003 likely representing a benign adrenal adenoma or myelolipoma. Moderate sigmoid diverticulosis. Stable appearance of the abdominal aorta demonstrating a short segment focal dissection within the suprarenal segment, endovascular repair of a  abdominal aortic aneurysm measuring 5.0 cm in greatest dimension, stable, as well as 2.2 cm right common iliac artery aneurysm, unchanged. Marked prostatic enlargement without evidence of bladder outlet obstruction. Small bilateral inguinal hernias containing small bowel without evidence of obstruction. Aortic aneurysm NOS (ICD10-I71.9). Aortic Atherosclerosis (ICD10-I70.0). Electronically Signed   By: Fidela Salisbury M.D.   On: 10/10/2020 23:21   DG Chest Portable 1 View  Result Date: 10/10/2020 CLINICAL DATA:  Weakness.  Abdominal pain and vomiting. EXAM: PORTABLE CHEST 1 VIEW COMPARISON:  06/01/2020, remote CT 06/03/2007 FINDINGS: Multi lead left-sided pacemaker in place. The heart is normal in size. Aortic atherosclerosis. The lungs are hyperinflated. Central bronchial thickening. No emphysema seen on prior CT. Streaky left lung base scarring. No acute airspace disease. No pulmonary edema. No pleural effusion or pneumothorax. No visualized pulmonary mass or nodule. No acute osseous abnormalities are seen. IMPRESSION: Hyperinflation and central bronchial thickening, imaging findings suggesting COPD. No focal airspace disease. Electronically Signed   By: Keith Rake M.D.   On: 10/10/2020 21:19     Assessment and Plan:   VT Known hx of VT, unable to see last night device check but note reports nothing detected or treated by his device Check mag VT rates well below his 1st detection zone Dr. Caryl Comes will discuss with [employer] rep and reduce VT zone to 120bpm Continue amiodarone and lidocaine gtts ICU bed  Lido level in AM LFTs mildly abnormal Check TSH  2. CAD No anginal symptoms No trops drawn Stable stress test last year Dr. Caryl Comes has discussed with Dr. [name], they will see, +/- cath, will need to hold his xarelto if so, though will defer to cards team  3. ICM 4. Chronic CHF (systolic) By exam, does not appear overtly volume OL He reports no symptoms of PND, sleeps with 2 pills  chronically Gets SOB walking down the hall in his home, and to his truck from the house, will need to rest Reports riding lawnmower/cuts grass every day Exertional capacity unchanged for at least 63mo maybe more Update his echo On great meds out patient ?intolerant of hydralazine Echo ordered Dr. CStanford Breedwill see  5. Paroxysmal Afib CHA2DS2Vasc is 5, on Xarelto SR here  6. Abdominal c/o CT with a number of chronic/stable findings GI consulted via IM team     Risk Assessment/Risk Scores:  {   For questions or updates, please contact [facility] Please consult [email or web address] for contact info under    Signed, RBaldwin Jamaica PA-C  10/11/2020 7:22 AM  Monomorphic ventricular tachycardia-recurrent below detection  History of ventricular tachycardia storm on amiodarone (4/22--ATP x74; HV therapies x14)  Ischemic cardiomyopathy with prior PCI (EF 20%)  Congestive heart failure-class IIIb  COPD  Atrial fibrillation on Xarelto  Implantable defibrillator-[employer] (abandoned fractured RV lead replaced 2018, GEN change 2016, known atrial lead noise)    Abdominal discomfort  Patient with recurrent ventricular tachycardia, slowed by the amiodarone and again presenting below detection.  Some or all of his symptoms over the last few weeks might be related to slow recurrent ventricular tachycardia. Number of issues are begged; first is there a reversible trigger, the more specifically is there an ischemic trigger and have reviewed this  with Dr. [name] and we will proceed with catheterization.  Second is the need for device reprogramming so as to be able to detect and treat the slow ventricular tachycardia.  This may encroach upon sinus rates which potentially can complicate treatment options.  We will work on this this morning.  Third is the therapeutic approach to reduce the recurrence of ventricular tachycardia.  Recent work from the [facility] suggested that ablation in patients  who fail amiodarone has a better outcome than patient's for whom augmented medical therapy is attempted.  Moreover, recent data from [facility] highlights the lack of efficacy of ranolazine in patients who are already on amiodarone and in those patients who have atrial fibrillation.  Will defer ultimately to Dr. Greggory Brandy but would anticipate in the short-term adjunctive mexiletine;  will discontinue lido    He describes GI symptoms to his taking the hydralazine.  His renal function is adequate, blood pressure is adequate in general, and I would be inclined towards discontinuing the hydralazine increasing his Entresto.  I have reached out to Dr. Cherylann Ratel, his primary cardiologist who will review these treatment options  Hold the Xarelto for now and will use IV heparin   Agree with need fo r GI eval

## 2020-10-11 NOTE — Progress Notes (Signed)
Pharmacy Electrolyte Replacement  Recent Labs:  Recent Labs    10/11/20 1101  K 4.1  MG 1.9  CREATININE 1.04    Low Critical Values (K </= 2.5, Phos </= 1, Mg </= 1) Present: None  MD Contacted: none  Plan: Give 2 grams of IV magnesium sulfate

## 2020-10-11 NOTE — Progress Notes (Signed)
[facility] PROGRESS NOTE    Progress Note  JORGEN MERRITT  [id number] DOB: 05/30/1945 DOA: 10/10/2020 PCP: Merryl Hacker, No     Brief Narrative:   Benjamin Cook is an 75 y.o. male past medical history of CAD status post PCI, chronic systolic heart failure status post AICD placement, paroxysmal atrial fibrillation on Xarelto comes into the ED having nausea vomiting and abdominal discomfort has been going on for the last 4 weeks in the ER he was found to be in V. tach cardiology was consulted who cardioverted the patient, CT scan of the abdomen and pelvis was unremarkable mildly elevated LFTs hemoglobin of 13 COVID test was negative was started on amiodarone bolus and infusion    Assessment/Plan:   V tach ([place]) Cardioverted in the ED by cardiologist, started on amiodarone infusion continue on Coreg. Try to keep potassium greater than 4 magnesium greater than 2. He developed VTs again in the ED patient was sedated upgraded to ICU level Coreg and Entresto were held. Successfully cardioverted with amiodarone bolus then switch to lidocaine drip. Cardiology recommended ischemic evaluation and will proceed with cardiac cath.  Abdominal pain with nausea and vomiting: CT scan of the abdomen pelvis unremarkable, LFTs are mildly elevated. Also in the differential is probably increasing preload from ineffective cardiac output from his VT causing passive congestion in the liver. This happens more after eating food we will start on PPI twice a day, denies no melanotic stools. He relates it happens every time he takes his hydralazine.  Dressing was discontinued and Entresto was increased.  Mildly elevated LFTs: Acute hepatitis panel pending.  History of systolic heart failure: Cardiology increase his Entresto, discontinue hydralazine. He is currently on Entresto and Coreg and his blood pressure is close to goal.  Further management per cardiology.  History of chronic normocytic  anemia: No signs of oral bleeding continue to follow CBC.  Paroxysmal atrial fibrillation ([name]) Rate control after amiodarone boluses.  Now on lidocaine.   DVT prophylaxis: lovenox Family Communication:none Status is: Inpatient  Remains inpatient appropriate because:Hemodynamically unstable  Dispo: The patient is from: Home              Anticipated d/c is to: SNF              Patient currently is not medically stable to d/c.   Difficult to place patient No        Code Status:     Code Status Orders  (From admission, onward)           Start     Ordered   10/11/20 0204  Full code  Continuous        10/11/20 0204           Code Status History     Date Active Date Inactive Code Status Order ID Comments User Context   06/01/2020 1746 06/04/2020 1900 Full Code [id number]  Baldwin Jamaica, PA-C ED   04/22/2019 0348 04/22/2019 2354 Full Code [id number]  Clois Dupes, MD ED   03/03/2017 1329 03/05/2017 1321 Full Code [id number]  Gabriel Earing, PA-C Inpatient   11/24/2016 1549 11/30/2016 1643 Full Code [id number]  Rondel Jumbo, PA-C ED   11/15/2016 1542 11/18/2016 1923 Full Code [id number]  Roxan Hockey, MD ED   07/16/2016 1951 07/18/2016 1431 Full Code [id number]  Leanor Kail, [place] Inpatient   03/28/2015 1643 03/30/2015 1525 Full Code [id number]  Waldemar Dickens, MD ED   05/27/2014 1514 05/27/2014 2037 Full Code  [id number]  Sherren Mocha, MD Inpatient   03/18/2014 1132 03/18/2014 1629 Full Code [id number]  Thompson Grayer, MD Inpatient         IV Access:   Peripheral IV   Procedures and diagnostic studies:   CT ABDOMEN PELVIS W CONTRAST  Result Date: 10/10/2020 CLINICAL DATA:  Abdominal pain, acute, nonlocalized EXAM: CT ABDOMEN AND PELVIS WITH CONTRAST TECHNIQUE: Multidetector CT imaging of the abdomen and pelvis was performed using the standard protocol following bolus administration of intravenous contrast. CONTRAST:  100 cc Omnipaque 300 COMPARISON:   04/04/2017 FINDINGS: Lower chest: There is stable left lower lobe bronchiectasis and mucoid impaction with associated parenchymal scarring and volume loss. No pleural effusion. Pacemaker leads are seen within the right heart. Left ventricular dilation is noted with stable thinning of the septal and apical wall in keeping with prior myocardial infarction. Trace pericardial effusion. Hepatobiliary: No focal liver abnormality is seen. No gallstones, gallbladder wall thickening, or biliary dilatation. Pancreas: Unremarkable Spleen: Unremarkable Adrenals/Urinary Tract: Stable 3.3 x 4.4 cm heterogeneously enhancing right adrenal mass, stable since remote prior examination of 10/14/2003, likely representing a benign adrenal adenoma or myelolipoma. Left adrenal gland is unremarkable. The kidneys are normal in size and position. Scattered simple cortical cysts are seen within the kidneys bilaterally. The kidneys are otherwise unremarkable. The prostate gland is centrally enlarged and protrudes into the bladder lumen. The bladder is not distended. Stomach/Bowel: Moderate sigmoid diverticulosis. Unremarkable loops of small bowel are seen within small bilateral inguinal hernias. The stomach, small bowel, and large bowel are otherwise unremarkable. Appendix normal. No free intraperitoneal gas or fluid. Vascular/Lymphatic: Short segment focal dissection of the a suprarenal aorta is again identified with a patent false lumen noted posteriorly. No evidence of rupture or periaortic inflammatory change. Infrarenal abdominal aortic aneurysm has undergone endovascular repair utilizing a bifurcated stent graft demonstrating infrarenal proximal fixation and bilateral common iliac distal landing zones. Small mural thrombus is again identified within the a left common iliac limb, unchanged, without evidence of hemodynamically significant stenosis. The aneurysm sac is unchanged measuring 4.7 x 5.0 cm at axial image # 36/3. Extensive  atherosclerotic calcification noted within the a mesenteric and renal vasculature, however, the degree of stenosis is not well assessed on this examination. 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 No pathologic adenopathy within the abdomen and pelvis. Reproductive: There is marked prostatic enlargement, particularly centrally which protrudes into the bladder lumen. This appears similar to prior examination. Other: Small bilateral inguinal hernias are present. Rectum unremarkable. Musculoskeletal: Degenerative changes are seen within the lumbar spine. No lytic or blastic bone lesions are identified. IMPRESSION: No acute intra-abdominal pathology identified. No definite radiographic explanation for the patient's reported symptoms. Stable left basilar bronchiectasis with superimposed mucoid impaction and volume loss. Stable 4.4 cm right adrenal mass since remote prior examination of 10/14/2003 likely representing a benign adrenal adenoma or myelolipoma. Moderate sigmoid diverticulosis. 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 Marked prostatic enlargement without evidence of bladder outlet obstruction. Small bilateral inguinal hernias containing small bowel without evidence of obstruction. Aortic aneurysm NOS (ICD10-I71.9). Aortic Atherosclerosis (ICD10-I70.0). Electronically Signed   By: Fidela Salisbury M.D.   On: 10/10/2020 23:21   DG Chest Portable 1 View  Result Date: 10/10/2020 CLINICAL DATA:  Weakness.  Abdominal pain  and vomiting. EXAM: PORTABLE CHEST 1 VIEW COMPARISON:  06/01/2020, remote CT 06/03/2007 FINDINGS: Multi lead left-sided pacemaker in place. The heart is normal in size. Aortic atherosclerosis. The lungs are hyperinflated. Central  bronchial thickening. No emphysema seen on prior CT. Streaky left lung base scarring. No acute airspace disease. No pulmonary edema. No pleural effusion or pneumothorax. No visualized pulmonary mass or nodule. No acute osseous abnormalities are seen. IMPRESSION: Hyperinflation and central bronchial thickening, imaging findings suggesting COPD. No focal airspace disease. Electronically Signed   By: Keith Rake M.D.   On: 10/10/2020 21:19     Medical Consultants:   None.   Subjective:    [name] no complaints  Objective:    Vitals:   10/11/20 0630 10/11/20 0637 10/11/20 0639 10/11/20 0644  BP: 94/77     Pulse: (!) 133 61 61 60  Resp: 19 (!) 21 (!) 21 17  Temp:      TempSrc:      SpO2: 92% 93% 91% 93%  Weight:      Height:       SpO2: 93 % O2 Flow Rate (L/min): 4 L/min  No intake or output data in the 24 hours ending 10/11/20 0700 Filed Weights   10/10/20 2012  Weight: 63 kg    Exam: General exam: In no acute distress. Respiratory system: Good air movement and clear to auscultation. Cardiovascular system: S1 & S2 heard, RRR. No JVD. Gastrointestinal system: Abdomen is nondistended, soft and nontender.  Extremities: No pedal edema. Skin: No rashes, lesions or ulcers Psychiatry: Judgement and insight appear normal. Mood & affect appropriate.    Data Reviewed:    Labs: Basic Metabolic Panel: Recent Labs  Lab 10/10/20 2023  NA 137  K 4.3  CL 103  CO2 26  GLUCOSE 104*  BUN 13  CREATININE 1.19  CALCIUM 9.3   GFR Estimated Creatinine Clearance: 46.3 mL/min (by C-G formula based on SCr of 1.19 mg/dL). Liver Function Tests: Recent Labs  Lab 10/10/20 2023  AST 58*  ALT 77*  ALKPHOS 70  BILITOT 0.7  PROT 7.1  ALBUMIN 3.4*   Recent Labs  Lab 10/10/20 2023  LIPASE 33   No results for input(s): AMMONIA in the last 168 hours. Coagulation profile No results for input(s): INR, PROTIME in the last 168 hours. COVID-19 Labs  No results for  input(s): DDIMER, FERRITIN, LDH, CRP in the last 72 hours.  Lab Results  Component Value Date   SARSCOV2NAA NEGATIVE 10/10/2020   SARSCOV2NAA NEGATIVE 06/01/2020   [place] NEGATIVE 04/22/2019   [place] Not Detected 02/04/2019    CBC: Recent Labs  Lab 10/10/20 2023  WBC 11.7*  NEUTROABS 8.5*  HGB 12.9*  HCT 39.5  MCV 96.3  PLT 260   Cardiac Enzymes: No results for input(s): CKTOTAL, CKMB, CKMBINDEX, TROPONINI in the last 168 hours. BNP (last 3 results) Recent Labs    07/12/20 1017  PROBNP 309   CBG: No results for input(s): GLUCAP in the last 168 hours. D-Dimer: No results for input(s): DDIMER in the last 72 hours. Hgb A1c: No results for input(s): HGBA1C in the last 72 hours. Lipid Profile: No results for input(s): CHOL, HDL, LDLCALC, TRIG, CHOLHDL, LDLDIRECT in the last 72 hours. Thyroid function studies: No results for input(s): TSH, T4TOTAL, T3FREE, THYROIDAB in the last 72 hours.  Invalid input(s): FREET3 Anemia work up: No results for input(s): VITAMINB12, FOLATE, FERRITIN, TIBC, IRON, RETICCTPCT in the last 72 hours. Sepsis Labs: Recent Labs  Lab  10/10/20 2023  WBC 11.7*   Microbiology Recent Results (from the past 240 hour(s))  Resp Panel by RT-PCR (Flu A&B, Covid) Nasopharyngeal Swab     Status: None   Collection Time: 10/10/20  8:56 PM   Specimen: Nasopharyngeal Swab; Nasopharyngeal(NP) swabs in vial transport medium  Result Value Ref Range Status   SARS Coronavirus 2 by RT PCR NEGATIVE NEGATIVE Final    Comment: (NOTE) SARS-CoV-2 target nucleic acids are NOT DETECTED.  The SARS-CoV-2 RNA is generally detectable in upper respiratory specimens during the acute phase of infection. The lowest concentration of SARS-CoV-2 viral copies this assay can detect is 138 copies/mL. A negative result does not preclude SARS-Cov-2 infection and should not be used as the sole basis for treatment or other patient management decisions. A negative result  may occur with  improper specimen collection/handling, submission of specimen other than nasopharyngeal swab, presence of viral mutation(s) within the areas targeted by this assay, and inadequate number of viral copies(<138 copies/mL). A negative result must be combined with clinical observations, patient history, and epidemiological information. The expected result is Negative.  Fact Sheet for Patients:  EntrepreneurPulse.com.au  Fact Sheet for Healthcare Providers:  IncredibleEmployment.be  This test is no t yet approved or cleared by the [place] FDA and  has been authorized for detection and/or diagnosis of SARS-CoV-2 by FDA under an Emergency Use Authorization (EUA). This EUA will remain  in effect (meaning this test can be used) for the duration of the COVID-19 declaration under Section 564(b)(1) of the Act, 21 U.S.C.section 360bbb-3(b)(1), unless the authorization is terminated  or revoked sooner.       Influenza A by PCR NEGATIVE NEGATIVE Final   Influenza B by PCR NEGATIVE NEGATIVE Final    Comment: (NOTE) The Xpert Xpress SARS-CoV-2/FLU/RSV plus assay is intended as an aid in the diagnosis of influenza from Nasopharyngeal swab specimens and should not be used as a sole basis for treatment. Nasal washings and aspirates are unacceptable for Xpert Xpress SARS-CoV-2/FLU/RSV testing.  Fact Sheet for Patients: EntrepreneurPulse.com.au  Fact Sheet for Healthcare Providers: IncredibleEmployment.be  This test is not yet approved or cleared by the [place] FDA and has been authorized for detection and/or diagnosis of SARS-CoV-2 by FDA under an Emergency Use Authorization (EUA). This EUA will remain in effect (meaning this test can be used) for the duration of the COVID-19 declaration under Section 564(b)(1) of the Act, 21 U.S.C. section 360bbb-3(b)(1), unless the authorization is terminated  or revoked.  Performed at [facility], [place] [address], [place], [place] [address]      Medications:    atorvastatin  80 mg Oral QPM   carvedilol  6.25 mg Oral BID WC   mometasone-formoterol  2 puff Inhalation BID   omega-3 acid ethyl esters  1 g Oral Daily   potassium chloride SA  20 mEq Oral Daily   rivaroxaban  20 mg Oral Q supper   sacubitril-valsartan  1 tablet Oral BID   Continuous Infusions:  amiodarone 60 mg/hr (10/11/20 0617)   lidocaine 1 mg/min (10/11/20 0528)      LOS: 0 days   Charlynne Cousins  [facility]  10/11/2020, 7:00 AM

## 2020-10-12 ENCOUNTER — Encounter (HOSPITAL_COMMUNITY): Payer: Self-pay | Admitting: Cardiovascular Disease

## 2020-10-12 ENCOUNTER — Inpatient Hospital Stay (HOSPITAL_COMMUNITY)
Admission: EM | Disposition: A | Discharge status: Home / Self Care | Payer: Self-pay | Source: Home / Self Care | Attending: Internal Medicine

## 2020-10-12 DIAGNOSIS — Z95810 Presence of automatic (implantable) cardiac defibrillator: Secondary | ICD-10-CM

## 2020-10-12 DIAGNOSIS — I472 Ventricular tachycardia: Secondary | ICD-10-CM | POA: Diagnosis not present

## 2020-10-12 DIAGNOSIS — I2510 Atherosclerotic heart disease of native coronary artery without angina pectoris: Secondary | ICD-10-CM | POA: Diagnosis not present

## 2020-10-12 HISTORY — PX: CORONARY ANGIOGRAPHY: CATH118303

## 2020-10-12 LAB — APTT: aPTT: 86 seconds — ABNORMAL HIGH (ref 24–36)

## 2020-10-12 LAB — BASIC METABOLIC PANEL
Anion gap: 7 (ref 5–15)
BUN: 9 mg/dL (ref 8–23)
CO2: 24 mmol/L (ref 22–32)
Calcium: 8.7 mg/dL — ABNORMAL LOW (ref 8.9–10.3)
Chloride: 104 mmol/L (ref 98–111)
Creatinine, Ser: 0.98 mg/dL (ref 0.61–1.24)
GFR, Estimated: >60 mL/min (ref >60)
Glucose, Bld: 114 mg/dL — ABNORMAL HIGH (ref 70–99)
Potassium: 3.7 mmol/L (ref 3.5–5.1)
Sodium: 135 mmol/L (ref 135–145)

## 2020-10-12 LAB — CBC
HCT: 40.1 % (ref 39.0–52.0)
Hemoglobin: 13.9 g/dL (ref 13.0–17.0)
MCH: 31.7 pg (ref 26.0–34.0)
MCHC: 34.7 g/dL (ref 30.0–36.0)
MCV: 91.3 fL (ref 80.0–100.0)
Platelets: 204 10*3/uL (ref 150–400)
RBC: 4.39 MIL/uL (ref 4.22–5.81)
RDW: 14.9 % (ref 11.5–15.5)
WBC: 13.2 10*3/uL — ABNORMAL HIGH (ref 4.0–10.5)
nRBC: 0 % (ref 0.0–0.2)

## 2020-10-12 LAB — POCT ACTIVATED CLOTTING TIME: Activated Clotting Time: 173 seconds

## 2020-10-12 LAB — LIDOCAINE LEVEL: Lidocaine Lvl: 2.3 ug/mL (ref 1.5–5.0)

## 2020-10-12 LAB — MAGNESIUM: Magnesium: 1.9 mg/dL (ref 1.7–2.4)

## 2020-10-12 LAB — HEPARIN LEVEL (UNFRACTIONATED): Heparin Unfractionated: 0.55 IU/mL (ref 0.30–0.70)

## 2020-10-12 SURGERY — CORONARY ANGIOGRAPHY (CATH LAB)
Anesthesia: LOCAL

## 2020-10-12 MED ORDER — SODIUM CHLORIDE 0.9% FLUSH: 3.0000 mL | INTRAVENOUS | Status: DC | PRN

## 2020-10-12 MED ORDER — MORPHINE SULFATE (PF) 2 MG/ML IV SOLN: 2.0000 mg | INTRAVENOUS | Status: DC | PRN

## 2020-10-12 MED ORDER — LIDOCAINE HCL (PF) 1 % IJ SOLN
INTRAMUSCULAR | Status: DC | PRN
  Administered 2020-10-12: 20 mL
  Administered 2020-10-12: 2 mL

## 2020-10-12 MED ORDER — FENTANYL CITRATE (PF) 100 MCG/2ML IJ SOLN
INTRAMUSCULAR | Status: DC | PRN
  Administered 2020-10-12: 25 ug via INTRAVENOUS

## 2020-10-12 MED ORDER — ACETAMINOPHEN 325 MG PO TABS: 650.0000 mg | ORAL_TABLET | ORAL | Status: DC | PRN

## 2020-10-12 MED ORDER — NITROGLYCERIN 1 MG/10 ML FOR IR/CATH LAB
INTRA_ARTERIAL | Status: AC
  Filled 2020-10-12: qty 10

## 2020-10-12 MED ORDER — HEPARIN (PORCINE) IN NACL 1000-0.9 UT/500ML-% IV SOLN
INTRAVENOUS | Status: AC
  Filled 2020-10-12: qty 1000

## 2020-10-12 MED ORDER — IOHEXOL 350 MG/ML SOLN
INTRAVENOUS | Status: DC | PRN
  Administered 2020-10-12: 55 mL

## 2020-10-12 MED ORDER — HYDRALAZINE HCL 20 MG/ML IJ SOLN: 10.0000 mg | INTRAMUSCULAR | Status: AC | PRN

## 2020-10-12 MED ORDER — HEPARIN SODIUM (PORCINE) 1000 UNIT/ML IJ SOLN
INTRAMUSCULAR | Status: AC
  Filled 2020-10-12: qty 1

## 2020-10-12 MED ORDER — LABETALOL HCL 5 MG/ML IV SOLN: 10.0000 mg | INTRAVENOUS | Status: AC | PRN

## 2020-10-12 MED ORDER — AMIODARONE HCL 200 MG PO TABS
400.0000 mg | ORAL_TABLET | Freq: Two times a day (BID) | ORAL | Status: DC
  Administered 2020-10-12 – 2020-10-16 (×9): 400 mg via ORAL
  Filled 2020-10-12 (×9): qty 2

## 2020-10-12 MED ORDER — MIDAZOLAM HCL 2 MG/2ML IJ SOLN
INTRAMUSCULAR | Status: AC
  Filled 2020-10-12: qty 2

## 2020-10-12 MED ORDER — MEXILETINE HCL 200 MG PO CAPS
200.0000 mg | ORAL_CAPSULE | Freq: Two times a day (BID) | ORAL | Status: DC
  Administered 2020-10-12 – 2020-10-16 (×9): 200 mg via ORAL
  Filled 2020-10-12 (×11): qty 1

## 2020-10-12 MED ORDER — POTASSIUM CHLORIDE CRYS ER 20 MEQ PO TBCR
40.0000 meq | EXTENDED_RELEASE_TABLET | Freq: Once | ORAL | Status: AC
  Administered 2020-10-12: 40 meq via ORAL
  Filled 2020-10-12: qty 2

## 2020-10-12 MED ORDER — ASPIRIN 81 MG PO CHEW
81.0000 mg | CHEWABLE_TABLET | Freq: Every day | ORAL | Status: DC
  Administered 2020-10-13: 81 mg via ORAL
  Filled 2020-10-12: qty 1

## 2020-10-12 MED ORDER — ONDANSETRON HCL 4 MG/2ML IJ SOLN: 4.0000 mg | Freq: Four times a day (QID) | INTRAMUSCULAR | Status: DC | PRN

## 2020-10-12 MED ORDER — SODIUM CHLORIDE 0.9 % IV SOLN: INTRAVENOUS | Status: AC

## 2020-10-12 MED ORDER — HEPARIN (PORCINE) IN NACL 1000-0.9 UT/500ML-% IV SOLN
INTRAVENOUS | Status: DC | PRN
  Administered 2020-10-12 (×2): 500 mL

## 2020-10-12 MED ORDER — SODIUM CHLORIDE 0.9% FLUSH
3.0000 mL | Freq: Two times a day (BID) | INTRAVENOUS | Status: DC
  Administered 2020-10-12 – 2020-10-15 (×8): 3 mL via INTRAVENOUS

## 2020-10-12 MED ORDER — ACETAMINOPHEN 500 MG PO TABS: 1000.0000 mg | ORAL_TABLET | Freq: Three times a day (TID) | ORAL | Status: DC | PRN

## 2020-10-12 MED ORDER — FENTANYL CITRATE (PF) 100 MCG/2ML IJ SOLN
INTRAMUSCULAR | Status: AC
  Filled 2020-10-12: qty 2

## 2020-10-12 MED ORDER — LIDOCAINE HCL (PF) 1 % IJ SOLN
INTRAMUSCULAR | Status: AC
  Filled 2020-10-12: qty 30

## 2020-10-12 MED ORDER — HEPARIN (PORCINE) 25000 UT/250ML-% IV SOLN
1050.0000 [IU]/h | INTRAVENOUS | Status: DC
  Administered 2020-10-12: 1050 [IU]/h via INTRAVENOUS
  Filled 2020-10-12: qty 250

## 2020-10-12 MED ORDER — SODIUM CHLORIDE 0.9 % IV SOLN: 250.0000 mL | INTRAVENOUS | Status: DC | PRN

## 2020-10-12 MED ORDER — MAGNESIUM SULFATE 2 GM/50ML IV SOLN
2.0000 g | Freq: Once | INTRAVENOUS | Status: AC
  Administered 2020-10-12: 2 g via INTRAVENOUS
  Filled 2020-10-12 (×2): qty 50

## 2020-10-12 MED ORDER — ATORVASTATIN CALCIUM 80 MG PO TABS: 80.0000 mg | ORAL_TABLET | Freq: Every day | ORAL | Status: DC

## 2020-10-12 MED ORDER — MIDAZOLAM HCL 2 MG/2ML IJ SOLN
INTRAMUSCULAR | Status: DC | PRN
  Administered 2020-10-12: 1 mg via INTRAVENOUS

## 2020-10-12 MED ORDER — VERAPAMIL HCL 2.5 MG/ML IV SOLN
INTRAVENOUS | Status: AC
  Filled 2020-10-12: qty 2

## 2020-10-12 SURGICAL SUPPLY — 15 items
CATH INFINITI 5FR MULTPACK ANG (CATHETERS) ×2 IMPLANT
ELECT DEFIB PAD ADLT CADENCE (PAD) ×2 IMPLANT
GLIDESHEATH SLEND A-KIT 6F 22G (SHEATH) ×2 IMPLANT
GUIDEWIRE INQWIRE 1.5J.035X260 (WIRE) ×1 IMPLANT
INQWIRE 1.5J .035X260CM (WIRE) ×2
KIT HEART LEFT (KITS) ×2 IMPLANT
PACK CARDIAC CATHETERIZATION (CUSTOM PROCEDURE TRAY) ×2 IMPLANT
SHEATH PINNACLE 5F 10CM (SHEATH) ×2 IMPLANT
SHEATH PROBE COVER 6X72 (BAG) ×2 IMPLANT
TRANSDUCER W/STOPCOCK (MISCELLANEOUS) ×2 IMPLANT
TUBING CIL FLEX 10 FLL-RA (TUBING) ×2 IMPLANT
WIRE EMERALD 3MM-J .035X150CM (WIRE) ×2 IMPLANT
WIRE EMERALD 3MM-J .035X260CM (WIRE) ×2 IMPLANT
WIRE EMERALD ST .035X150CM (WIRE) IMPLANT
WIRE HI TORQ VERSACORE-J 145CM (WIRE) ×2 IMPLANT

## 2020-10-12 NOTE — Progress Notes (Signed)
Pharmacy Electrolyte Replacement  Recent Labs:  Recent Labs    10/12/20 0537  K 3.7  MG 1.9  CREATININE 0.98    Low Critical Values (K </= 2.5, Phos </= 1, Mg </= 1) Present: None  MD Contacted: none  Plan: Give 2 grams of IV magnesium

## 2020-10-12 NOTE — Progress Notes (Signed)
Progress Note  Patient Name: Benjamin Cook Date of Encounter: 10/12/2020  [facility] Cardiologist: Kirk Ruths, MD   Subjective   Pt denies CP or dyspnea  Inpatient Medications    Scheduled Meds:  atorvastatin  80 mg Oral QPM   carvedilol  6.25 mg Oral BID WC   Chlorhexidine Gluconate Cloth  6 each Topical Daily   mouth rinse  15 mL Mouth Rinse BID   mometasone-formoterol  2 puff Inhalation BID   omega-3 acid ethyl esters  1 g Oral Daily   pantoprazole (PROTONIX) IV  40 mg Intravenous Q12H   potassium chloride SA  20 mEq Oral Daily   sacubitril-valsartan  1 tablet Oral BID   sodium chloride flush  3 mL Intravenous Q12H   Continuous Infusions:  sodium chloride     sodium chloride 10 mL/hr at 10/12/20 0700   amiodarone 30 mg/hr (10/12/20 0700)   heparin 1,050 Units/hr (10/12/20 0700)   lidocaine 0.667 mg/min (10/12/20 0500)   PRN Meds: sodium chloride, acetaminophen, feeding supplement, nitroGLYCERIN, sodium chloride flush   Vital Signs    Vitals:   10/12/20 0400 10/12/20 0500 10/12/20 0600 10/12/20 0700  BP: (!) 144/70 (!) 158/73 (!) 161/71 (!) 156/74  Pulse: (!) 57 62 60 (!) 54  Resp: 20 (!) 21 (!) 24 (!) 21  Temp:      TempSrc:      SpO2: 93% 95% 93% 93%  Weight:  62.7 kg    Height:        Intake/Output Summary (Last 24 hours) at 10/12/2020 0714 Last data filed at 10/12/2020 0700 Gross per 24 hour  Intake 1327.85 ml  Output 510 ml  Net 817.85 ml    Last 3 Weights 10/12/2020 10/10/2020 07/12/2020  Weight (lbs) 138 lb 3.7 oz 138 lb 14.2 oz 140 lb  Weight (kg) 62.7 kg 63 kg 63.504 kg      Telemetry    Sinus with intermittent V pacing - Personally Reviewed  Physical Exam   GEN: NAD Neck: supple Cardiac: RRR, no rub Respiratory: CTA GI: Soft, NT ND MS: No edema Neuro:  Grossly intact Psych: Normal affect   Labs    Chemistry Recent Labs  Lab 10/10/20 2023 10/11/20 1101  NA 137 134*  K 4.3 4.1  CL 103 102  CO2 26 25  GLUCOSE 104*  101*  BUN 13 13  CREATININE 1.19 1.04  CALCIUM 9.3 9.1  PROT 7.1 6.7  ALBUMIN 3.4* 3.1*  AST 58* 49*  ALT 77* 64*  ALKPHOS 70 61  BILITOT 0.7 0.5  GFRNONAA >60 >60  ANIONGAP 8 7      Hematology Recent Labs  Lab 10/10/20 2023 10/11/20 1101  WBC 11.7* 9.5  RBC 4.10* 4.25  HGB 12.9* 13.6  HCT 39.5 39.6  MCV 96.3 93.2  MCH 31.5 32.0  MCHC 32.7 34.3  RDW 15.6* 15.3  PLT 260 232     Radiology    CT ABDOMEN PELVIS W CONTRAST  Result Date: 10/10/2020 CLINICAL DATA:  Abdominal pain, acute, nonlocalized EXAM: CT ABDOMEN AND PELVIS WITH CONTRAST TECHNIQUE: Multidetector CT imaging of the abdomen and pelvis was performed using the standard protocol following bolus administration of intravenous contrast. CONTRAST:  100 cc Omnipaque 300 COMPARISON:  04/04/2017 FINDINGS: Lower chest: There is stable left lower lobe bronchiectasis and mucoid impaction with associated parenchymal scarring and volume loss. No pleural effusion. Pacemaker leads are seen within the right heart. Left ventricular dilation is noted with stable thinning of  the septal and apical wall in keeping with prior myocardial infarction. Trace pericardial effusion. Hepatobiliary: No focal liver abnormality is seen. No gallstones, gallbladder wall thickening, or biliary dilatation. Pancreas: Unremarkable Spleen: Unremarkable Adrenals/Urinary Tract: Stable 3.3 x 4.4 cm heterogeneously enhancing right adrenal mass, stable since remote prior examination of 10/14/2003, likely representing a benign adrenal adenoma or myelolipoma. Left adrenal gland is unremarkable. The kidneys are normal in size and position. Scattered simple cortical cysts are seen within the kidneys bilaterally. The kidneys are otherwise unremarkable. The prostate gland is centrally enlarged and protrudes into the bladder lumen. The bladder is not distended. Stomach/Bowel: Moderate sigmoid diverticulosis. Unremarkable loops of small bowel are seen within small bilateral  inguinal hernias. The stomach, small bowel, and large bowel are otherwise unremarkable. Appendix normal. No free intraperitoneal gas or fluid. Vascular/Lymphatic: Short segment focal dissection of the a suprarenal aorta is again identified with a patent false lumen noted posteriorly. No evidence of rupture or periaortic inflammatory change. Infrarenal abdominal aortic aneurysm has undergone endovascular repair utilizing a bifurcated stent graft demonstrating infrarenal proximal fixation and bilateral common iliac distal landing zones. Small mural thrombus is again identified within the a left common iliac limb, unchanged, without evidence of hemodynamically significant stenosis. The aneurysm sac is unchanged measuring 4.7 x 5.0 cm at axial image # 36/3. Extensive atherosclerotic calcification noted within the a mesenteric and renal vasculature, however, the degree of stenosis is not well assessed on this examination. 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 No pathologic adenopathy within the abdomen and pelvis. Reproductive: There is marked prostatic enlargement, particularly centrally which protrudes into the bladder lumen. This appears similar to prior examination. Other: Small bilateral inguinal hernias are present. Rectum unremarkable. Musculoskeletal: Degenerative changes are seen within the lumbar spine. No lytic or blastic bone lesions are identified. IMPRESSION: No acute intra-abdominal pathology identified. No definite radiographic explanation for the patient's reported symptoms. Stable left basilar bronchiectasis with superimposed mucoid impaction and volume loss. Stable 4.4 cm right adrenal mass since remote prior examination of 10/14/2003 likely representing a benign adrenal adenoma or myelolipoma. Moderate sigmoid diverticulosis. Stable appearance of the abdominal aorta demonstrating a short segment focal dissection within the suprarenal segment, endovascular repair of a abdominal  aortic aneurysm measuring 5.0 cm in greatest dimension, stable, as well as 2.2 cm right common iliac artery aneurysm, unchanged. Marked prostatic enlargement without evidence of bladder outlet obstruction. Small bilateral inguinal hernias containing small bowel without evidence of obstruction. Aortic aneurysm NOS (ICD10-I71.9). Aortic Atherosclerosis (ICD10-I70.0). Electronically Signed   By: Fidela Salisbury M.D.   On: 10/10/2020 23:21   DG Chest Portable 1 View  Result Date: 10/10/2020 CLINICAL DATA:  Weakness.  Abdominal pain and vomiting. EXAM: PORTABLE CHEST 1 VIEW COMPARISON:  06/01/2020, remote CT 06/03/2007 FINDINGS: Multi lead left-sided pacemaker in place. The heart is normal in size. Aortic atherosclerosis. The lungs are hyperinflated. Central bronchial thickening. No emphysema seen on prior CT. Streaky left lung base scarring. No acute airspace disease. No pulmonary edema. No pleural effusion or pneumothorax. No visualized pulmonary mass or nodule. No acute osseous abnormalities are seen. IMPRESSION: Hyperinflation and central bronchial thickening, imaging findings suggesting COPD. No focal airspace disease. Electronically Signed   By: Keith Rake M.D.   On: 10/10/2020 21:19   ECHOCARDIOGRAM COMPLETE  Result Date: 10/11/2020    ECHOCARDIOGRAM REPORT   Patient Name:   EHTAN TRANUM Date of Exam: 10/11/2020 Medical Rec #:  [id number]  Height:       70.0 in Accession #:    [id number]        Weight:       138.9 lb Date of Birth:  [birth year]         BSA:          1.788 m Patient Age:    75 years          BP:           164/77 mmHg Patient Gender: M                 HR:           59 bpm. Exam Location:  Inpatient Procedure: 2D Echo, Cardiac Doppler, Color Doppler and Intracardiac            Opacification Agent Indications:    Ventricular tachycardia  History:        Patient has prior history of Echocardiogram examinations, most                 recent 04/09/2019. CHF and Cardiomyopathy, CAD,  Defibrillator,                 COPD and PVD, Arrythmias:Vtach and Atrial Fibrillation,                 Signs/Symptoms:Chest Pain; Risk Factors:Former Smoker.  Sonographer:    Dustin Flock RDCS Referring Phys: [id number] [place]  1. Left ventricular ejection fraction, by estimation, is 30 to 35%. The left ventricle has moderately decreased function. The left ventricle has no regional wall motion abnormalities. There is mild concentric left ventricular hypertrophy. Left ventricular diastolic parameters are consistent with Grade I diastolic dysfunction (impaired relaxation). Elevated left ventricular end-diastolic pressure. There is akinesis of the left ventricular, mid inferoseptal wall and anteroseptal wall. There is akinesis of the left ventricular, apical septal wall, inferior wall and apical segment. There is severe hypokinesis of the left ventricular, mid-apical anterior wall.  2. Right ventricular systolic function is normal. The right ventricular size is normal. There is normal pulmonary artery systolic pressure. The estimated right ventricular systolic pressure is 0000000 mmHg.  3. The mitral valve is normal in structure. Trivial mitral valve regurgitation. No evidence of mitral stenosis.  4. The aortic valve is calcified. Aortic valve regurgitation is not visualized. Mild to moderate aortic valve sclerosis/calcification is present, without any evidence of aortic stenosis.  5. The inferior vena cava is normal in size with greater than 50% respiratory variability, suggesting right atrial pressure of 3 mmHg.  6. There is very sluggish flow in the apical LV which increases risk of apical LV thrombus formation. FINDINGS  Left Ventricle: Left ventricular ejection fraction, by estimation, is 30 to 35%. The left ventricle has moderately decreased function. The left ventricle has no regional wall motion abnormalities. Severe hypokinesis of the left ventricular, mid-apical anterior wall. Definity  contrast agent was given IV to delineate the left ventricular endocardial borders. The left ventricular internal cavity size was normal in size. There is mild concentric left ventricular hypertrophy. Left ventricular diastolic parameters are consistent with Grade I diastolic dysfunction (impaired relaxation). Elevated left ventricular end-diastolic pressure. Right Ventricle: The right ventricular size is normal. No increase in right ventricular wall thickness. Right ventricular systolic function is normal. There is normal pulmonary artery systolic pressure. The tricuspid regurgitant velocity is 2.67 m/s, and  with an assumed right atrial pressure of 3 mmHg, the estimated right ventricular systolic pressure is 31.5  mmHg. Left Atrium: Left atrial size was normal in size. Right Atrium: Right atrial size was normal in size. Pericardium: There is no evidence of pericardial effusion. Mitral Valve: The mitral valve is normal in structure. Trivial mitral valve regurgitation. No evidence of mitral valve stenosis. Tricuspid Valve: The tricuspid valve is normal in structure. Tricuspid valve regurgitation is trivial. No evidence of tricuspid stenosis. Aortic Valve: The aortic valve is calcified. Aortic valve regurgitation is not visualized. Mild to moderate aortic valve sclerosis/calcification is present, without any evidence of aortic stenosis. Pulmonic Valve: The pulmonic valve was normal in structure. Pulmonic valve regurgitation is not visualized. No evidence of pulmonic stenosis. Aorta: The aortic root is normal in size and structure. Venous: The inferior vena cava is normal in size with greater than 50% respiratory variability, suggesting right atrial pressure of 3 mmHg. IAS/Shunts: No atrial level shunt detected by color flow Doppler. Additional Comments: A device lead is visualized.  LEFT VENTRICLE PLAX 2D LVIDd:         5.10 cm  Diastology LVIDs:         4.50 cm  LV e' medial:    4.46 cm/s LV PW:         1.30 cm  LV  E/e' medial:  14.0 LV IVS:        1.30 cm  LV e' lateral:   4.24 cm/s LVOT diam:     2.00 cm  LV E/e' lateral: 14.7 LV SV:         60 LV SV Index:   34 LVOT Area:     3.14 cm  RIGHT VENTRICLE RV Basal diam:  2.70 cm RV S prime:     10.40 cm/s TAPSE (M-mode): 2.1 cm LEFT ATRIUM             Index       RIGHT ATRIUM           Index LA diam:        3.30 cm 1.85 cm/m  RA Area:     13.80 cm LA Vol (A2C):   35.5 ml 19.86 ml/m RA Volume:   37.60 ml  21.03 ml/m LA Vol (A4C):   29.6 ml 16.56 ml/m LA Biplane Vol: 35.7 ml 19.97 ml/m  AORTIC VALVE LVOT Vmax:   101.00 cm/s LVOT Vmean:  55.900 cm/s LVOT VTI:    0.192 m  AORTA Ao Root diam: 2.90 cm MITRAL VALVE               TRICUSPID VALVE MV Area (PHT): 3.08 cm    TR Peak grad:   28.5 mmHg MV Decel Time: 246 msec    TR Vmax:        267.00 cm/s MV E velocity: 62.40 cm/s MV A velocity: 73.30 cm/s  SHUNTS MV E/A ratio:  0.85        Systemic VTI:  0.19 m                            Systemic Diam: 2.00 cm Fransico Him MD Electronically signed by Fransico Him MD Signature Date/Time: 10/11/2020/3:22:47 PM    Final       Patient Profile     75 y.o. male with past medical history of ischemic cardiomyopathy, chronic systolic congestive heart failure, prior ventricular tachycardia, prior ICD, paroxysmal atrial fibrillation, hypertension, hyperlipidemia, previous abdominal aortic aneurysm repair, COPD admitted with recurrent ventricular tachycardia.    Assessment & Plan  1 ventricular tachycardia-patient remains in sinus rhythm today.  We will continue amiodarone and lidocaine.  Patient is scheduled for cardiac catheterization this morning to exclude significant coronary disease/ischemia contributing to ventricular arrhythmias.  Will need input from electrophysiology concerning outpatient antiarrhythmics.    2 ischemic cardiomyopathy-repeat echocardiogram shows ejection fraction 30 to 35%.  ICD in place.  Continue Entresto and carvedilol.  We will titrate as tolerated by  blood pressure.  We will add Jardiance 10 mg daily prior to discharge and resume spironolactone.  I will not resume hydralazine/nitrates unless blood pressure remains high following titration of carvedilol and Entresto.    3 chronic systolic congestive heart failure-euvolemic on examination.  We will continue to follow.  4 history of paroxysmal atrial fibrillation-patient is in sinus rhythm.  Continue beta-blocker.  Resume Xarelto once all procedures complete.  5 coronary artery disease-continue statin.  He is not on aspirin at this point given need for anticoagulation.  For questions or updates, please contact [place] Please consult [email or web address] for contact info under        Signed, Kirk Ruths, MD  10/12/2020, 7:14 AM

## 2020-10-12 NOTE — Progress Notes (Signed)
Progress Note  Patient Name: Benjamin Cook Date of Encounter: 10/12/2020  [facility] Cardiologist: Kirk Ruths, MD   Subjective   difficult sleep last night, no CP, no SOB  Inpatient Medications    Scheduled Meds:  atorvastatin  80 mg Oral QPM   carvedilol  6.25 mg Oral BID WC   Chlorhexidine Gluconate Cloth  6 each Topical Daily   mouth rinse  15 mL Mouth Rinse BID   mometasone-formoterol  2 puff Inhalation BID   omega-3 acid ethyl esters  1 g Oral Daily   pantoprazole (PROTONIX) IV  40 mg Intravenous Q12H   sacubitril-valsartan  1 tablet Oral BID   sodium chloride flush  3 mL Intravenous Q12H   Continuous Infusions:  sodium chloride     sodium chloride 10 mL/hr at 10/12/20 0700   amiodarone 30 mg/hr (10/12/20 0700)   heparin 1,050 Units/hr (10/12/20 0700)   lidocaine 0.667 mg/min (10/12/20 0500)   PRN Meds: sodium chloride, acetaminophen, feeding supplement, nitroGLYCERIN, sodium chloride flush   Vital Signs    Vitals:   10/12/20 0400 10/12/20 0500 10/12/20 0600 10/12/20 0700  BP: (!) 144/70 (!) 158/73 (!) 161/71 (!) 156/74  Pulse: (!) 57 62 60 (!) 54  Resp: 20 (!) 21 (!) 24 (!) 21  Temp:      TempSrc:      SpO2: 93% 95% 93% 93%  Weight:  62.7 kg    Height:        Intake/Output Summary (Last 24 hours) at 10/12/2020 0746 Last data filed at 10/12/2020 0700 Gross per 24 hour  Intake 1327.85 ml  Output 510 ml  Net 817.85 ml   Last 3 Weights 10/12/2020 10/10/2020 07/12/2020  Weight (lbs) 138 lb 3.7 oz 138 lb 14.2 oz 140 lb  Weight (kg) 62.7 kg 63 kg 63.504 kg      Telemetry    APacing/SR, no VT- Personally Reviewed  ECG    No new EKGs - Personally Reviewed  Physical Exam   GEN: No acute distress.   Neck: No JVD Cardiac: RRR, no murmurs, rubs, or gallops.  Respiratory: very soft exp wheeze b/l. GI: Soft, nontender, non-distended  MS: No edema; No deformity. Neuro:  Nonfocal  Psych: Normal affect   Labs    High Sensitivity Troponin:  No  results for input(s): TROPONINIHS in the last 720 hours.    Chemistry Recent Labs  Lab 10/10/20 2023 10/11/20 1101 10/12/20 0537  NA 137 134* 135  K 4.3 4.1 3.7  CL 103 102 104  CO2 '26 25 24  '$ GLUCOSE 104* 101* 114*  BUN '13 13 9  '$ CREATININE 1.19 1.04 0.98  CALCIUM 9.3 9.1 8.7*  PROT 7.1 6.7  --   ALBUMIN 3.4* 3.1*  --   AST 58* 49*  --   ALT 77* 64*  --   ALKPHOS 70 61  --   BILITOT 0.7 0.5  --   GFRNONAA >60 >60 >60  ANIONGAP '8 7 7     '$ Hematology Recent Labs  Lab 10/10/20 2023 10/11/20 1101  WBC 11.7* 9.5  RBC 4.10* 4.25  HGB 12.9* 13.6  HCT 39.5 39.6  MCV 96.3 93.2  MCH 31.5 32.0  MCHC 32.7 34.3  RDW 15.6* 15.3  PLT 260 232    BNPNo results for input(s): BNP, PROBNP in the last 168 hours.   DDimer No results for input(s): DDIMER in the last 168 hours.   Radiology    CT ABDOMEN PELVIS W CONTRAST Result  Date: 10/10/2020 CLINICAL DATA:  Abdominal pain, acute, nonlocalized EXAM: CT ABDOMEN AND PELVIS WITH CONTRAST TECHNIQUE: Multidetector CT imaging of the abdomen and pelvis was performed using the standard protocol following bolus administration of intravenous contrast. CONTRAST:  100 cc Omnipaque 300 COMPARISON:  04/04/2017 FINDINGS: Lower chest: There is stable left lower lobe bronchiectasis and mucoid impaction with associated parenchymal scarring and volume loss. No pleural effusion. Pacemaker leads are seen within the right heart. Left ventricular dilation is noted with stable thinning of the septal and apical wall in keeping with prior myocardial infarction. Trace pericardial effusion. Hepatobiliary: No focal liver abnormality is seen. No gallstones, gallbladder wall thickening, or biliary dilatation. Pancreas: Unremarkable Spleen: Unremarkable Adrenals/Urinary Tract: Stable 3.3 x 4.4 cm heterogeneously enhancing right adrenal mass, stable since remote prior examination of 10/14/2003, likely representing a benign adrenal adenoma or myelolipoma. Left adrenal gland  is unremarkable. The kidneys are normal in size and position. Scattered simple cortical cysts are seen within the kidneys bilaterally. The kidneys are otherwise unremarkable. The prostate gland is centrally enlarged and protrudes into the bladder lumen. The bladder is not distended. Stomach/Bowel: Moderate sigmoid diverticulosis. Unremarkable loops of small bowel are seen within small bilateral inguinal hernias. The stomach, small bowel, and large bowel are otherwise unremarkable. Appendix normal. No free intraperitoneal gas or fluid. Vascular/Lymphatic: Short segment focal dissection of the a suprarenal aorta is again identified with a patent false lumen noted posteriorly. No evidence of rupture or periaortic inflammatory change. Infrarenal abdominal aortic aneurysm has undergone endovascular repair utilizing a bifurcated stent graft demonstrating infrarenal proximal fixation and bilateral common iliac distal landing zones. Small mural thrombus is again identified within the a left common iliac limb, unchanged, without evidence of hemodynamically significant stenosis. The aneurysm sac is unchanged measuring 4.7 x 5.0 cm at axial image # 36/3. Extensive atherosclerotic calcification noted within the a mesenteric and renal vasculature, however, the degree of stenosis is not well assessed on this examination. 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 No pathologic adenopathy within the abdomen and pelvis. Reproductive: There is marked prostatic enlargement, particularly centrally which protrudes into the bladder lumen. This appears similar to prior examination. Other: Small bilateral inguinal hernias are present. Rectum unremarkable. Musculoskeletal: Degenerative changes are seen within the lumbar spine. No lytic or blastic bone lesions are identified. IMPRESSION: No acute intra-abdominal pathology identified. No definite radiographic explanation for the patient's reported symptoms. Stable left  basilar bronchiectasis with superimposed mucoid impaction and volume loss. Stable 4.4 cm right adrenal mass since remote prior examination of 10/14/2003 likely representing a benign adrenal adenoma or myelolipoma. Moderate sigmoid diverticulosis. 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 Marked prostatic enlargement without evidence of bladder outlet obstruction. Small bilateral inguinal hernias containing small bowel without evidence of obstruction. Aortic aneurysm NOS (ICD10-I71.9). Aortic Atherosclerosis (ICD10-I70.0). Electronically Signed   By: Fidela Salisbury M.D.   On: 10/10/2020 23:21   DG Chest Portable 1 View Result Date: 10/10/2020 CLINICAL DATA:  Weakness.  Abdominal pain and vomiting. EXAM: PORTABLE CHEST 1 VIEW COMPARISON:  06/01/2020, remote CT 06/03/2007 FINDINGS: Multi lead left-sided pacemaker in place. The heart is normal in size. Aortic atherosclerosis. The lungs are hyperinflated. Central bronchial thickening. No emphysema seen on prior CT. Streaky left lung base scarring. No acute airspace disease.  No pulmonary edema. No pleural effusion or pneumothorax. No visualized pulmonary mass or nodule. No acute osseous abnormalities are seen. IMPRESSION: Hyperinflation and central bronchial thickening, imaging findings suggesting COPD. No focal airspace disease. Electronically Signed   By: Keith Rake M.D.   On: 10/10/2020 21:19      Cardiac Studies   11/23/2019: stress myoview Nuclear stress EF: 22%. No T wave inversion was noted during stress. There was no ST segment deviation noted during stress. Defect 1: There is a large defect of severe severity present in the basal inferior, mid anteroseptal, mid inferoseptal, mid inferior, apical septal, apical  inferior, apical lateral and apex location. Findings consistent with prior myocardial infarction with peri-infarct ischemia. This is a high risk study. The left ventricular ejection fraction is severely decreased (<30%).   Large size, severe severity mostly fixed (SDS 3) apical to mid inferior, apical, apical septal and anteroapical perfusion defect suggestive of mid to distal LAD and RCA territory scar with minimal peri-infarct ischemia. LVEF 22% with akinesis of the previously mentioned walls. This is a high risk study. Compared to a prior study on 12/21/2012, there is no significant change.       04/09/2019; TTE IMPRESSIONS   1. Left ventricular ejection fraction, by estimation, is 35%%. The left  ventricle has moderate to severely decreased function. There is mild left  ventricular hypertrophy. Left ventricular diastolic parameters are  consistent with Grade I diastolic  dysfunction (impaired relaxation).   2. Right ventricular systolic function is normal. The right ventricular  size is normal.   3. The mitral valve is normal in structure and function. Trivial mitral  valve regurgitation.   4. The aortic valve is abnormal. Aortic valve regurgitation is trivial.  Mild aortic valve sclerosis is present, with no evidence of aortic valve  stenosis.   5. The inferior vena cava is normal in size with greater than 50%  respiratory variability, suggesting right atrial pressure of 3 mmHg.   Patient Profile     75 y.o. male Benjamin Cook is a 75 y.o. male with a hx of CAD (s/p PCI to LAD 2001), ICM, chronic CHF (systolic), ICD, HTN, HLD, VT, AFib, AAA (s/p endovascular repair, Dr. Donzetta Matters), COPD, VT who is being seen 10/11/2020 for the evaluation of VT at the request of Dr. Hal Hope.     Device information SJM dual chamber ICD,  Implanted 2012, gen change 03/18/2014 Has an abandoned RV lead (fracture), new lead placed 2018 Known A lead noise   04/21/2019 @ 9:31PM VT episode This was a  true MMVT, 203bpm, one ATP delivered failed, , one HV therapy, 25J with clean break ATP therapies were added 10/30/19: VT w/HV tx (pt preferred to hold off AAD), stress with only peri-infarct ischemia, not felt to need cath   06/01/20: VT storm 74 ATP  Therapies and 14 HV therapies All appropriate All were MMVT episodes, but did have two different VT's Largely ATP failed though a couple did work Also a couple ATPs that slowed his VT to below detection and labeled return to sinus AMIODARONE started Additional VT zone added 166bpm (With no anginal symptoms and recent stable stress test cath not pursued  10/11/20: recurrent VT (rates 130's) under detection Amiodarone IV >> reloaded PO ADDED mexiletine 1st VT zone rate reduced to 120bpm NID 100 and with 10 spins of ATP prior to shock  Assessment & Plan    VT Known hx of VT, unable to see last night device check but note  reports nothing detected or treated by his device Check mag VT rates well below his 1st detection zone None further sine yesterday AM  Dr. Caryl Comes has seen and examined the patient Transition amiodarone to po loading  Change lidocaine gtt > mexiletine '200mg'$  BID   2. CAD No anginal symptoms No trops drawn Stable stress test last year Cards team is on board Planned for cath today   3. ICM 4. Chronic CHF (systolic) By exam, does not appear overtly volume OL He reports no symptoms of PND, sleeps with 2 pills chronically Gets SOB walking down the hall in his home, and to his truck from the house, will need to rest Reports riding lawn mower/cuts grass every day Exertional capacity unchanged for at least 70mo maybe more Echo with elevated LVEDP > pending cath today By exam does not appear volume OL   5. Paroxysmal Afib CHA2DS2Vasc is 5, on Xarelto >> hep gtt for procedures SR here    6. Abdominal c/o CT with a number of chronic/stable findings Pt connected symptom to hydralazine, this was stopped IM team  managing PPI started     For questions or updates, please contact CMondoviPlease consult [email or web address] for contact info under        Signed, RBaldwin Jamaica PA-C  10/12/2020, 7:46 AM

## 2020-10-12 NOTE — Progress Notes (Signed)
ANTICOAGULATION CONSULT NOTE  Pharmacy Consult for heparin Indication: atrial fibrillation  No Known Allergies  Patient Measurements: Height: '5\' 10"'$  (177.8 cm) Weight: 62.7 kg (138 lb 3.7 oz) IBW/kg (Calculated) : 73 Heparin Dosing Weight: 63kg  Vital Signs: Temp: 98.2 F (36.8 C) (09/08 1200) Temp Source: Oral (09/08 1200) BP: 151/71 (09/08 1500) Pulse Rate: 62 (09/08 1500)  Labs: Recent Labs    10/10/20 2023 10/11/20 1101 10/11/20 2009 10/12/20 0537  HGB 12.9* 13.6  --  13.9  HCT 39.5 39.6  --  40.1  PLT 260 232  --  204  APTT  --   --  50* 86*  HEPARINUNFRC  --   --   --  0.55  CREATININE 1.19 1.04  --  0.98     Estimated Creatinine Clearance: 56 mL/min (by C-G formula based on SCr of 0.98 mg/dL).   Medical History: Past Medical History:  Diagnosis Date   Adrenal mass ([place])    per pt this is remote (10 years) and benign by biopsy   AICD (automatic cardioverter/defibrillator) present    Anxiety    Arthritis    CAD (coronary artery disease)    a. s/p PCI to LAD 2001 b. myoview 2014 high risk with scar LAD/RCA territory but no ischemia   Cardiomyopathy, ischemic    CHF (congestive heart failure) ([name])    class II/III   COPD (chronic obstructive pulmonary disease) ([place])    smokes cigars but has quit cigarettes   Defibrillator discharge 04/21/2019   Diverticulitis    Dyspnea    Flu 03/2015   HTN (hypertension)    Hyperlipidemia    NSVT (nonsustained ventricular tachycardia) ([place]) 03/05/2017   Paroxysmal atrial fibrillation ([place]) 03/2015   chads2vasc score is 4   PSA (psoriatic arthritis) ([name])    increased   PVD (peripheral vascular disease) ([name])     Assessment: 67 yoM with hx AF admitted with VT. Pt on Xarelto at home (last dose 9/5 pm). Xarelto was swapped to heparin with need for cath. Patient is now status post cath on 9/8 with heparin held since this morning. Heparin will be resumed 8 hours from sheath removal at 1830. CBC stable this  morning.  The aPTT this morning of 86 was at goal. Will resume heparin infusion 8 hours post sheath removal and follow up plans to transition back to PTA Xarelto.    Goal of Therapy:  Heparin level 0.3-0.7 units/ml aPTT 66-102 seconds Monitor platelets by anticoagulation protocol: Yes   Plan:  REsume IV heparin at 1050 units/hr. Repeat aPTT 8 hrs from resuming heparin. Daily heparin level, aPTT, CBC  Thank you for allowing pharmacy to participate in this patient's care.  Reatha Harps, PharmD PGY1 Pharmacy Resident 10/12/2020 3:38 PM Check [email or web address] for unit specific pharmacy number

## 2020-10-12 NOTE — Interval H&P Note (Signed)
Cath Lab Visit (complete for each Cath Lab visit)  Clinical Evaluation Leading to the Procedure:   ACS: No.  Non-ACS:    Anginal Classification: CCS I  Anti-ischemic medical therapy: Maximal Therapy (2 or more classes of medications)  Non-Invasive Test Results: No non-invasive testing performed  Prior CABG: No previous CABG      History and Physical Interval Note:  10/12/2020 8:38 AM  Benjamin Cook  has presented today for surgery, with the diagnosis of chest pain.  The various methods of treatment have been discussed with the patient and family. After consideration of risks, benefits and other options for treatment, the patient has consented to  Procedure(s): LEFT HEART CATH AND CORONARY ANGIOGRAPHY (N/A) as a surgical intervention.  The patient's history has been reviewed, patient examined, no change in status, stable for surgery.  I have reviewed the patient's chart and labs.  Questions were answered to the patient's satisfaction.     Quay Burow

## 2020-10-12 NOTE — Progress Notes (Signed)
Pt to cath lab holding [place] 2, lidocaine gtt remains in place infusing at '1mg'$ /min= 15cc/hr

## 2020-10-12 NOTE — Progress Notes (Signed)
[facility] PROGRESS NOTE    Progress Note  Benjamin Cook  [id number] DOB: 1945-08-11 DOA: 10/10/2020 PCP: Merryl Cook, No     Brief Narrative:   Benjamin Cook is an 75 y.o. male past medical history of CAD status post PCI, chronic systolic heart failure status post AICD placement, paroxysmal atrial fibrillation on Xarelto comes into the ED having nausea vomiting and abdominal discomfort has been going on for the last 4 weeks in the ER he was found to be in V. tach cardiology was consulted who cardioverted the patient, CT scan of the abdomen and pelvis was unremarkable mildly elevated LFTs hemoglobin of 13 COVID test was negative was started on amiodarone bolus and infusion    Assessment/Plan:   V tach [facility]): Cardiology has been consulted they recommend to continue amiodarone and lidocaine. Patient scheduled for left heart cath to rule out ischemic disease. Electrophysiology is also on board. Further management per cardiology electrophysiologist.  Ischemic cardiomyopathy/chronic systolic heart failure: With a 2D echo showing an EF of 30% AICD in place. Currently on Entresto, Jardiance and Coreg.  Titrate as tolerated per cardiology. Appears euvolemic on physical exam.  History of chronic normocytic anemia: No signs of oral bleeding continue to follow CBC.  Paroxysmal atrial fibrillation ([name]) Rate controlled on beta-blocker and amiodarone can be restarted on Xarelto once procedures are completed.   DVT prophylaxis: Lovenox Family Communication: Patient's wife Status is: Inpatient  Remains inpatient appropriate because:Hemodynamically unstable  Dispo: The patient is from: Home              Anticipated d/c is to: SNF              Patient currently is not medically stable to d/c.   Difficult to place patient No        Code Status:     Code Status Orders  (From admission, onward)           Start     Ordered   10/11/20 0204  Full code  Continuous         10/11/20 0204           Code Status History     Date Active Date Inactive Code Status Order ID Comments User Context   06/01/2020 1746 06/04/2020 1900 Full Code [id number]  Baldwin Jamaica, PA-C ED   04/22/2019 0348 04/22/2019 2354 Full Code [id number]  Clois Dupes, MD ED   03/03/2017 1329 03/05/2017 1321 Full Code [id number]  Gabriel Earing, PA-C Inpatient   11/24/2016 1549 11/30/2016 1643 Full Code [id number]  Rondel Jumbo, PA-C ED   11/15/2016 1542 11/18/2016 1923 Full Code [id number]  Roxan Hockey, MD ED   07/16/2016 1951 07/18/2016 1431 Full Code [id number]  Leanor Kail, [place] Inpatient   03/28/2015 1643 03/30/2015 1525 Full Code [id number]  Waldemar Dickens, MD ED   05/27/2014 1514 05/27/2014 2037 Full Code [id number]  Sherren Mocha, MD Inpatient   03/18/2014 1132 03/18/2014 1629 Full Code [id number]  Thompson Grayer, MD Inpatient         IV Access:   Peripheral IV   Procedures and diagnostic studies:   CT ABDOMEN PELVIS W CONTRAST  Result Date: 10/10/2020 CLINICAL DATA:  Abdominal pain, acute, nonlocalized EXAM: CT ABDOMEN AND PELVIS WITH CONTRAST TECHNIQUE: Multidetector CT imaging of the abdomen and pelvis was performed using the standard protocol following bolus administration of intravenous contrast. CONTRAST:  100 cc Omnipaque 300 COMPARISON:  04/04/2017 FINDINGS: Lower chest: There is stable left lower  lobe bronchiectasis and mucoid impaction with associated parenchymal scarring and volume loss. No pleural effusion. Pacemaker leads are seen within the right heart. Left ventricular dilation is noted with stable thinning of the septal and apical wall in keeping with prior myocardial infarction. Trace pericardial effusion. Hepatobiliary: No focal liver abnormality is seen. No gallstones, gallbladder wall thickening, or biliary dilatation. Pancreas: Unremarkable Spleen: Unremarkable Adrenals/Urinary Tract: Stable 3.3 x 4.4 cm heterogeneously enhancing right adrenal mass,  stable since remote prior examination of 10/14/2003, likely representing a benign adrenal adenoma or myelolipoma. Left adrenal gland is unremarkable. The kidneys are normal in size and position. Scattered simple cortical cysts are seen within the kidneys bilaterally. The kidneys are otherwise unremarkable. The prostate gland is centrally enlarged and protrudes into the bladder lumen. The bladder is not distended. Stomach/Bowel: Moderate sigmoid diverticulosis. Unremarkable loops of small bowel are seen within small bilateral inguinal hernias. The stomach, small bowel, and large bowel are otherwise unremarkable. Appendix normal. No free intraperitoneal gas or fluid. Vascular/Lymphatic: Short segment focal dissection of the a suprarenal aorta is again identified with a patent false lumen noted posteriorly. No evidence of rupture or periaortic inflammatory change. Infrarenal abdominal aortic aneurysm has undergone endovascular repair utilizing a bifurcated stent graft demonstrating infrarenal proximal fixation and bilateral common iliac distal landing zones. Small mural thrombus is again identified within the a left common iliac limb, unchanged, without evidence of hemodynamically significant stenosis. The aneurysm sac is unchanged measuring 4.7 x 5.0 cm at axial image # 36/3. Extensive atherosclerotic calcification noted within the a mesenteric and renal vasculature, however, the degree of stenosis is not well assessed on this examination. 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 No pathologic adenopathy within the abdomen and pelvis. Reproductive: There is marked prostatic enlargement, particularly centrally which protrudes into the bladder lumen. This appears similar to prior examination. Other: Small bilateral inguinal hernias are present. Rectum unremarkable. Musculoskeletal: Degenerative changes are seen within the lumbar spine. No lytic or blastic bone lesions are identified. IMPRESSION: No  acute intra-abdominal pathology identified. No definite radiographic explanation for the patient's reported symptoms. Stable left basilar bronchiectasis with superimposed mucoid impaction and volume loss. Stable 4.4 cm right adrenal mass since remote prior examination of 10/14/2003 likely representing a benign adrenal adenoma or myelolipoma. Moderate sigmoid diverticulosis. 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 Marked prostatic enlargement without evidence of bladder outlet obstruction. Small bilateral inguinal hernias containing small bowel without evidence of obstruction. Aortic aneurysm NOS (ICD10-I71.9). Aortic Atherosclerosis (ICD10-I70.0). Electronically Signed   By: Fidela Salisbury M.D.   On: 10/10/2020 23:21   DG Chest Portable 1 View  Result Date: 10/10/2020 CLINICAL DATA:  Weakness.  Abdominal pain and vomiting. EXAM: PORTABLE CHEST 1 VIEW COMPARISON:  06/01/2020, remote CT 06/03/2007 FINDINGS: Multi lead left-sided pacemaker in place. The heart is normal in size. Aortic atherosclerosis. The lungs are hyperinflated. Central bronchial thickening. No emphysema seen on prior CT. Streaky left lung base scarring. No acute airspace disease. No pulmonary edema. No pleural effusion or pneumothorax. No visualized pulmonary mass or nodule. No acute osseous abnormalities are seen. IMPRESSION: Hyperinflation and central bronchial thickening, imaging findings suggesting COPD. No focal airspace disease. Electronically Signed   By: Keith Rake M.D.   On: 10/10/2020 21:19   ECHOCARDIOGRAM COMPLETE  Result Date: 10/11/2020    ECHOCARDIOGRAM REPORT   Patient Name:   Benjamin Cook Date of Exam: 10/11/2020 Medical Rec #:  [id number]         Height:       70.0 in Accession #:     [id number]        Weight:       138.9 lb Date of Birth:  09-May-1945         BSA:          1.788 m Patient Age:    34 years          BP:           164/77 mmHg Patient Gender: M                 HR:           59 bpm. Exam Location:  Inpatient Procedure: 2D Echo, Cardiac Doppler, Color Doppler and Intracardiac            Opacification Agent Indications:    Ventricular tachycardia  History:        Patient has prior history of Echocardiogram examinations, most                 recent 04/09/2019. CHF and Cardiomyopathy, CAD, Defibrillator,                 COPD and PVD, Arrythmias:Vtach and Atrial Fibrillation,                 Signs/Symptoms:Chest Pain; Risk Factors:Former Smoker.  Sonographer:    Dustin Flock RDCS Referring Phys: [id number] [place]  1. Left ventricular ejection fraction, by estimation, is 30 to 35%. The left ventricle has moderately decreased function. The left ventricle has no regional wall motion abnormalities. There is mild concentric left ventricular hypertrophy. Left ventricular diastolic parameters are consistent with Grade I diastolic dysfunction (impaired relaxation). Elevated left ventricular end-diastolic pressure. There is akinesis of the left ventricular, mid inferoseptal wall and anteroseptal wall. There is akinesis of the left ventricular, apical septal wall, inferior wall and apical segment. There is severe hypokinesis of the left ventricular, mid-apical anterior wall.  2. Right ventricular systolic function is normal. The right ventricular size is normal. There is normal pulmonary artery systolic pressure. The estimated right ventricular systolic pressure is 0000000 mmHg.  3. The mitral valve is normal in structure. Trivial mitral valve regurgitation. No evidence of mitral stenosis.  4. The aortic valve is calcified. Aortic valve regurgitation is not visualized. Mild to moderate aortic valve sclerosis/calcification is present, without any evidence of aortic stenosis.  5.  The inferior vena cava is normal in size with greater than 50% respiratory variability, suggesting right atrial pressure of 3 mmHg.  6. There is very sluggish flow in the apical LV which increases risk of apical LV thrombus formation. FINDINGS  Left Ventricle: Left ventricular ejection fraction, by estimation, is 30 to 35%. The left ventricle has moderately decreased function. The left ventricle has no regional wall motion abnormalities. Severe hypokinesis of the left ventricular, mid-apical anterior wall. Definity contrast agent was given IV to delineate the left ventricular endocardial borders. The left ventricular internal cavity size was normal in size. There is mild concentric left ventricular hypertrophy. Left ventricular diastolic parameters are consistent with Grade I diastolic dysfunction (impaired relaxation). Elevated left ventricular end-diastolic pressure. Right Ventricle: The right ventricular size is normal. No increase in right ventricular wall thickness. Right ventricular systolic function is  normal. There is normal pulmonary artery systolic pressure. The tricuspid regurgitant velocity is 2.67 m/s, and  with an assumed right atrial pressure of 3 mmHg, the estimated right ventricular systolic pressure is 0000000 mmHg. Left Atrium: Left atrial size was normal in size. Right Atrium: Right atrial size was normal in size. Pericardium: There is no evidence of pericardial effusion. Mitral Valve: The mitral valve is normal in structure. Trivial mitral valve regurgitation. No evidence of mitral valve stenosis. Tricuspid Valve: The tricuspid valve is normal in structure. Tricuspid valve regurgitation is trivial. No evidence of tricuspid stenosis. Aortic Valve: The aortic valve is calcified. Aortic valve regurgitation is not visualized. Mild to moderate aortic valve sclerosis/calcification is present, without any evidence of aortic stenosis. Pulmonic Valve: The pulmonic valve was normal in structure. Pulmonic  valve regurgitation is not visualized. No evidence of pulmonic stenosis. Aorta: The aortic root is normal in size and structure. Venous: The inferior vena cava is normal in size with greater than 50% respiratory variability, suggesting right atrial pressure of 3 mmHg. IAS/Shunts: No atrial level shunt detected by color flow Doppler. Additional Comments: A device lead is visualized.  LEFT VENTRICLE PLAX 2D LVIDd:         5.10 cm  Diastology LVIDs:         4.50 cm  LV e' medial:    4.46 cm/s LV PW:         1.30 cm  LV E/e' medial:  14.0 LV IVS:        1.30 cm  LV e' lateral:   4.24 cm/s LVOT diam:     2.00 cm  LV E/e' lateral: 14.7 LV SV:         60 LV SV Index:   34 LVOT Area:     3.14 cm  RIGHT VENTRICLE RV Basal diam:  2.70 cm RV S prime:     10.40 cm/s TAPSE (M-mode): 2.1 cm LEFT ATRIUM             Index       RIGHT ATRIUM           Index LA diam:        3.30 cm 1.85 cm/m  RA Area:     13.80 cm LA Vol (A2C):   35.5 ml 19.86 ml/m RA Volume:   37.60 ml  21.03 ml/m LA Vol (A4C):   29.6 ml 16.56 ml/m LA Biplane Vol: 35.7 ml 19.97 ml/m  AORTIC VALVE LVOT Vmax:   101.00 cm/s LVOT Vmean:  55.900 cm/s LVOT VTI:    0.192 m  AORTA Ao Root diam: 2.90 cm MITRAL VALVE               TRICUSPID VALVE MV Area (PHT): 3.08 cm    TR Peak grad:   28.5 mmHg MV Decel Time: 246 msec    TR Vmax:        267.00 cm/s MV E velocity: 62.40 cm/s MV A velocity: 73.30 cm/s  SHUNTS MV E/A ratio:  0.85        Systemic VTI:  0.19 m                            Systemic Diam: 2.00 cm Fransico Him MD Electronically signed by Fransico Him MD Signature Date/Time: 10/11/2020/3:22:47 PM    Final      Medical Consultants:   None.   Subjective:    Benjamin Cook no complaints  Objective:    Vitals:   10/12/20 0400 10/12/20 0500 10/12/20 0600 10/12/20 0700  BP: (!) 144/70 (!) 158/73 (!) 161/71 (!) 156/74  Pulse: (!) 57 62 60 (!) 54  Resp: 20 (!) 21 (!) 24 (!) 21  Temp:      TempSrc:      SpO2: 93% 95% 93% 93%  Weight:  62.7 kg     Height:       SpO2: 93 % O2 Flow Rate (L/min): 2 L/min   Intake/Output Summary (Last 24 hours) at 10/12/2020 0758 Last data filed at 10/12/2020 0700 Gross per 24 hour  Intake 1327.85 ml  Output 510 ml  Net 817.85 ml   Filed Weights   10/10/20 2012 10/12/20 0500  Weight: 63 kg 62.7 kg    Exam: General exam: In no acute distress. Respiratory system: Good air movement and clear to auscultation. Cardiovascular system: S1 & S2 heard, RRR. No JVD. Gastrointestinal system: Abdomen is nondistended, soft and nontender.  Extremities: No pedal edema. Skin: No rashes, lesions or ulcer   Data Reviewed:    Labs: Basic Metabolic Panel: Recent Labs  Lab 10/10/20 2023 10/11/20 1101 10/12/20 0537  NA 137 134* 135  K 4.3 4.1 3.7  CL 103 102 104  CO2 '26 25 24  '$ GLUCOSE 104* 101* 114*  BUN '13 13 9  '$ CREATININE 1.19 1.04 0.98  CALCIUM 9.3 9.1 8.7*  MG  --  1.9 1.9    GFR Estimated Creatinine Clearance: 56 mL/min (by C-G formula based on SCr of 0.98 mg/dL). Liver Function Tests: Recent Labs  Lab 10/10/20 2023 10/11/20 1101  AST 58* 49*  ALT 77* 64*  ALKPHOS 70 61  BILITOT 0.7 0.5  PROT 7.1 6.7  ALBUMIN 3.4* 3.1*    Recent Labs  Lab 10/10/20 2023  LIPASE 33    No results for input(s): AMMONIA in the last 168 hours. Coagulation profile No results for input(s): INR, PROTIME in the last 168 hours. COVID-19 Labs  No results for input(s): DDIMER, FERRITIN, LDH, CRP in the last 72 hours.  Lab Results  Component Value Date   SARSCOV2NAA NEGATIVE 10/10/2020   SARSCOV2NAA NEGATIVE 06/01/2020   [place] NEGATIVE 04/22/2019   [place] Not Detected 02/04/2019    CBC: Recent Labs  Lab 10/10/20 2023 10/11/20 1101  WBC 11.7* 9.5  NEUTROABS 8.5*  --   HGB 12.9* 13.6  HCT 39.5 39.6  MCV 96.3 93.2  PLT 260 232    Cardiac Enzymes: No results for input(s): CKTOTAL, CKMB, CKMBINDEX, TROPONINI in the last 168 hours. BNP (last 3 results) Recent Labs     07/12/20 1017  PROBNP 309    CBG: Recent Labs  Lab 10/11/20 2008  GLUCAP 124*   D-Dimer: No results for input(s): DDIMER in the last 72 hours. Hgb A1c: No results for input(s): HGBA1C in the last 72 hours. Lipid Profile: No results for input(s): CHOL, HDL, LDLCALC, TRIG, CHOLHDL, LDLDIRECT in the last 72 hours. Thyroid function studies: Recent Labs    10/11/20 1101  TSH 0.950   Anemia work up: No results for input(s): VITAMINB12, FOLATE, FERRITIN, TIBC, IRON, RETICCTPCT in the last 72 hours. Sepsis Labs: Recent Labs  Lab 10/10/20 2023 10/11/20 1101  WBC 11.7* 9.5    Microbiology Recent Results (from the past 240 hour(s))  Resp Panel by RT-PCR (Flu A&B, Covid) Nasopharyngeal Swab     Status: None   Collection Time: 10/10/20  8:56 PM   Specimen: Nasopharyngeal Swab; Nasopharyngeal(NP) swabs in vial  transport medium  Result Value Ref Range Status   SARS Coronavirus 2 by RT PCR NEGATIVE NEGATIVE Final    Comment: (NOTE) SARS-CoV-2 target nucleic acids are NOT DETECTED.  The SARS-CoV-2 RNA is generally detectable in upper respiratory specimens during the acute phase of infection. The lowest concentration of SARS-CoV-2 viral copies this assay can detect is 138 copies/mL. A negative result does not preclude SARS-Cov-2 infection and should not be used as the sole basis for treatment or other patient management decisions. A negative result may occur with  improper specimen collection/handling, submission of specimen other than nasopharyngeal swab, presence of viral mutation(s) within the areas targeted by this assay, and inadequate number of viral copies(<138 copies/mL). A negative result must be combined with clinical observations, patient history, and epidemiological information. The expected result is Negative.  Fact Sheet for Patients:  EntrepreneurPulse.com.au  Fact Sheet for Healthcare Providers:   IncredibleEmployment.be  This test is no t yet approved or cleared by the [place] FDA and  has been authorized for detection and/or diagnosis of SARS-CoV-2 by FDA under an Emergency Use Authorization (EUA). This EUA will remain  in effect (meaning this test can be used) for the duration of the COVID-19 declaration under Section 564(b)(1) of the Act, 21 U.S.C.section 360bbb-3(b)(1), unless the authorization is terminated  or revoked sooner.       Influenza A by PCR NEGATIVE NEGATIVE Final   Influenza B by PCR NEGATIVE NEGATIVE Final    Comment: (NOTE) The Xpert Xpress SARS-CoV-2/FLU/RSV plus assay is intended as an aid in the diagnosis of influenza from Nasopharyngeal swab specimens and should not be used as a sole basis for treatment. Nasal washings and aspirates are unacceptable for Xpert Xpress SARS-CoV-2/FLU/RSV testing.  Fact Sheet for Patients: EntrepreneurPulse.com.au  Fact Sheet for Healthcare Providers: IncredibleEmployment.be  This test is not yet approved or cleared by the [place] FDA and has been authorized for detection and/or diagnosis of SARS-CoV-2 by FDA under an Emergency Use Authorization (EUA). This EUA will remain in effect (meaning this test can be used) for the duration of the COVID-19 declaration under Section 564(b)(1) of the Act, 21 U.S.C. section 360bbb-3(b)(1), unless the authorization is terminated or revoked.  Performed at [facility], [place] [address], [place], [place] [address]   MRSA Next Gen by PCR, Nasal     Status: None   Collection Time: 10/11/20  8:31 PM   Specimen: Nasal Mucosa; Nasal Swab  Result Value Ref Range Status   MRSA by PCR Next Gen NOT DETECTED NOT DETECTED Final    Comment: (NOTE) The GeneXpert MRSA Assay (FDA approved for NASAL specimens only), is one component of a comprehensive MRSA colonization surveillance program. It is not intended to diagnose  MRSA infection nor to guide or monitor treatment for MRSA infections. Test performance is not FDA approved in patients less than 55 years old. Performed at [facility], [place] [address] [place], [place] [address]      Medications:    amiodarone  400 mg Oral BID   atorvastatin  80 mg Oral QPM   carvedilol  6.25 mg Oral BID WC   Chlorhexidine Gluconate Cloth  6 each Topical Daily   mouth rinse  15 mL Mouth Rinse BID   mexiletine  200 mg Oral Q12H   mometasone-formoterol  2 puff Inhalation BID   omega-3 acid ethyl esters  1 g Oral Daily   pantoprazole (PROTONIX) IV  40 mg Intravenous Q12H   sacubitril-valsartan  1 tablet Oral BID   sodium chloride flush  3 mL Intravenous Q12H   Continuous Infusions:  sodium chloride     sodium chloride 10 mL/hr at 10/12/20 0700   heparin 1,050 Units/hr (10/12/20 0700)      LOS: 1 day   Charlynne Cousins  [facility]  10/12/2020, 7:58 AM

## 2020-10-12 NOTE — Progress Notes (Signed)
SITE AREA: right femoral/groin  SITE PRIOR TO REMOVAL:  LEVEL 0  PRESSURE APPLIED FOR: approximately 20 minutes  MANUAL: yes  PATIENT STATUS DURING PULL: stable  POST PULL SITE:  LEVEL 0  POST PULL INSTRUCTIONS GIVEN: yes  POST PULL PULSES PRESENT: pedal pulses palpable at +1  DRESSING APPLIED: gauze with tegaderm  BEDREST BEGINS @ 1030  COMMENTS:  sheath removed by 2H RN, Mancel Bale

## 2020-10-12 NOTE — H&P (View-Only) (Signed)
Progress Note  Patient Name: Benjamin Cook Date of Encounter: 10/12/2020  [facility] Cardiologist: Kirk Ruths, MD   Subjective   Pt denies CP or dyspnea  Inpatient Medications    Scheduled Meds:  atorvastatin  80 mg Oral QPM   carvedilol  6.25 mg Oral BID WC   Chlorhexidine Gluconate Cloth  6 each Topical Daily   mouth rinse  15 mL Mouth Rinse BID   mometasone-formoterol  2 puff Inhalation BID   omega-3 acid ethyl esters  1 g Oral Daily   pantoprazole (PROTONIX) IV  40 mg Intravenous Q12H   potassium chloride SA  20 mEq Oral Daily   sacubitril-valsartan  1 tablet Oral BID   sodium chloride flush  3 mL Intravenous Q12H   Continuous Infusions:  sodium chloride     sodium chloride 10 mL/hr at 10/12/20 0700   amiodarone 30 mg/hr (10/12/20 0700)   heparin 1,050 Units/hr (10/12/20 0700)   lidocaine 0.667 mg/min (10/12/20 0500)   PRN Meds: sodium chloride, acetaminophen, feeding supplement, nitroGLYCERIN, sodium chloride flush   Vital Signs    Vitals:   10/12/20 0400 10/12/20 0500 10/12/20 0600 10/12/20 0700  BP: (!) 144/70 (!) 158/73 (!) 161/71 (!) 156/74  Pulse: (!) 57 62 60 (!) 54  Resp: 20 (!) 21 (!) 24 (!) 21  Temp:      TempSrc:      SpO2: 93% 95% 93% 93%  Weight:  62.7 kg    Height:        Intake/Output Summary (Last 24 hours) at 10/12/2020 0714 Last data filed at 10/12/2020 0700 Gross per 24 hour  Intake 1327.85 ml  Output 510 ml  Net 817.85 ml    Last 3 Weights 10/12/2020 10/10/2020 07/12/2020  Weight (lbs) 138 lb 3.7 oz 138 lb 14.2 oz 140 lb  Weight (kg) 62.7 kg 63 kg 63.504 kg      Telemetry    Sinus with intermittent V pacing - Personally Reviewed  Physical Exam   GEN: NAD Neck: supple Cardiac: RRR, no rub Respiratory: CTA GI: Soft, NT ND MS: No edema Neuro:  Grossly intact Psych: Normal affect   Labs    Chemistry Recent Labs  Lab 10/10/20 2023 10/11/20 1101  NA 137 134*  K 4.3 4.1  CL 103 102  CO2 26 25  GLUCOSE 104*  101*  BUN 13 13  CREATININE 1.19 1.04  CALCIUM 9.3 9.1  PROT 7.1 6.7  ALBUMIN 3.4* 3.1*  AST 58* 49*  ALT 77* 64*  ALKPHOS 70 61  BILITOT 0.7 0.5  GFRNONAA >60 >60  ANIONGAP 8 7      Hematology Recent Labs  Lab 10/10/20 2023 10/11/20 1101  WBC 11.7* 9.5  RBC 4.10* 4.25  HGB 12.9* 13.6  HCT 39.5 39.6  MCV 96.3 93.2  MCH 31.5 32.0  MCHC 32.7 34.3  RDW 15.6* 15.3  PLT 260 232     Radiology    CT ABDOMEN PELVIS W CONTRAST  Result Date: 10/10/2020 CLINICAL DATA:  Abdominal pain, acute, nonlocalized EXAM: CT ABDOMEN AND PELVIS WITH CONTRAST TECHNIQUE: Multidetector CT imaging of the abdomen and pelvis was performed using the standard protocol following bolus administration of intravenous contrast. CONTRAST:  100 cc Omnipaque 300 COMPARISON:  04/04/2017 FINDINGS: Lower chest: There is stable left lower lobe bronchiectasis and mucoid impaction with associated parenchymal scarring and volume loss. No pleural effusion. Pacemaker leads are seen within the right heart. Left ventricular dilation is noted with stable thinning of  the septal and apical wall in keeping with prior myocardial infarction. Trace pericardial effusion. Hepatobiliary: No focal liver abnormality is seen. No gallstones, gallbladder wall thickening, or biliary dilatation. Pancreas: Unremarkable Spleen: Unremarkable Adrenals/Urinary Tract: Stable 3.3 x 4.4 cm heterogeneously enhancing right adrenal mass, stable since remote prior examination of 10/14/2003, likely representing a benign adrenal adenoma or myelolipoma. Left adrenal gland is unremarkable. The kidneys are normal in size and position. Scattered simple cortical cysts are seen within the kidneys bilaterally. The kidneys are otherwise unremarkable. The prostate gland is centrally enlarged and protrudes into the bladder lumen. The bladder is not distended. Stomach/Bowel: Moderate sigmoid diverticulosis. Unremarkable loops of small bowel are seen within small bilateral  inguinal hernias. The stomach, small bowel, and large bowel are otherwise unremarkable. Appendix normal. No free intraperitoneal gas or fluid. Vascular/Lymphatic: Short segment focal dissection of the a suprarenal aorta is again identified with a patent false lumen noted posteriorly. No evidence of rupture or periaortic inflammatory change. Infrarenal abdominal aortic aneurysm has undergone endovascular repair utilizing a bifurcated stent graft demonstrating infrarenal proximal fixation and bilateral common iliac distal landing zones. Small mural thrombus is again identified within the a left common iliac limb, unchanged, without evidence of hemodynamically significant stenosis. The aneurysm sac is unchanged measuring 4.7 x 5.0 cm at axial image # 36/3. Extensive atherosclerotic calcification noted within the a mesenteric and renal vasculature, however, the degree of stenosis is not well assessed on this examination. 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 No pathologic adenopathy within the abdomen and pelvis. Reproductive: There is marked prostatic enlargement, particularly centrally which protrudes into the bladder lumen. This appears similar to prior examination. Other: Small bilateral inguinal hernias are present. Rectum unremarkable. Musculoskeletal: Degenerative changes are seen within the lumbar spine. No lytic or blastic bone lesions are identified. IMPRESSION: No acute intra-abdominal pathology identified. No definite radiographic explanation for the patient's reported symptoms. Stable left basilar bronchiectasis with superimposed mucoid impaction and volume loss. Stable 4.4 cm right adrenal mass since remote prior examination of 10/14/2003 likely representing a benign adrenal adenoma or myelolipoma. Moderate sigmoid diverticulosis. Stable appearance of the abdominal aorta demonstrating a short segment focal dissection within the suprarenal segment, endovascular repair of a abdominal  aortic aneurysm measuring 5.0 cm in greatest dimension, stable, as well as 2.2 cm right common iliac artery aneurysm, unchanged. Marked prostatic enlargement without evidence of bladder outlet obstruction. Small bilateral inguinal hernias containing small bowel without evidence of obstruction. Aortic aneurysm NOS (ICD10-I71.9). Aortic Atherosclerosis (ICD10-I70.0). Electronically Signed   By: Fidela Salisbury M.D.   On: 10/10/2020 23:21   DG Chest Portable 1 View  Result Date: 10/10/2020 CLINICAL DATA:  Weakness.  Abdominal pain and vomiting. EXAM: PORTABLE CHEST 1 VIEW COMPARISON:  06/01/2020, remote CT 06/03/2007 FINDINGS: Multi lead left-sided pacemaker in place. The heart is normal in size. Aortic atherosclerosis. The lungs are hyperinflated. Central bronchial thickening. No emphysema seen on prior CT. Streaky left lung base scarring. No acute airspace disease. No pulmonary edema. No pleural effusion or pneumothorax. No visualized pulmonary mass or nodule. No acute osseous abnormalities are seen. IMPRESSION: Hyperinflation and central bronchial thickening, imaging findings suggesting COPD. No focal airspace disease. Electronically Signed   By: Keith Rake M.D.   On: 10/10/2020 21:19   ECHOCARDIOGRAM COMPLETE  Result Date: 10/11/2020    ECHOCARDIOGRAM REPORT   Patient Name:   Benjamin Cook Date of Exam: 10/11/2020 Medical Rec #:  [id number]  Height:       70.0 in Accession #:    [id number]        Weight:       138.9 lb Date of Birth:  12/19/1945         BSA:          1.788 m Patient Age:    74 years          BP:           164/77 mmHg Patient Gender: M                 HR:           59 bpm. Exam Location:  Inpatient Procedure: 2D Echo, Cardiac Doppler, Color Doppler and Intracardiac            Opacification Agent Indications:    Ventricular tachycardia  History:        Patient has prior history of Echocardiogram examinations, most                 recent 04/09/2019. CHF and Cardiomyopathy, CAD,  Defibrillator,                 COPD and PVD, Arrythmias:Vtach and Atrial Fibrillation,                 Signs/Symptoms:Chest Pain; Risk Factors:Former Smoker.  Sonographer:    Dustin Flock RDCS Referring Phys: [id number] [place]  1. Left ventricular ejection fraction, by estimation, is 30 to 35%. The left ventricle has moderately decreased function. The left ventricle has no regional wall motion abnormalities. There is mild concentric left ventricular hypertrophy. Left ventricular diastolic parameters are consistent with Grade I diastolic dysfunction (impaired relaxation). Elevated left ventricular end-diastolic pressure. There is akinesis of the left ventricular, mid inferoseptal wall and anteroseptal wall. There is akinesis of the left ventricular, apical septal wall, inferior wall and apical segment. There is severe hypokinesis of the left ventricular, mid-apical anterior wall.  2. Right ventricular systolic function is normal. The right ventricular size is normal. There is normal pulmonary artery systolic pressure. The estimated right ventricular systolic pressure is 0000000 mmHg.  3. The mitral valve is normal in structure. Trivial mitral valve regurgitation. No evidence of mitral stenosis.  4. The aortic valve is calcified. Aortic valve regurgitation is not visualized. Mild to moderate aortic valve sclerosis/calcification is present, without any evidence of aortic stenosis.  5. The inferior vena cava is normal in size with greater than 50% respiratory variability, suggesting right atrial pressure of 3 mmHg.  6. There is very sluggish flow in the apical LV which increases risk of apical LV thrombus formation. FINDINGS  Left Ventricle: Left ventricular ejection fraction, by estimation, is 30 to 35%. The left ventricle has moderately decreased function. The left ventricle has no regional wall motion abnormalities. Severe hypokinesis of the left ventricular, mid-apical anterior wall. Definity  contrast agent was given IV to delineate the left ventricular endocardial borders. The left ventricular internal cavity size was normal in size. There is mild concentric left ventricular hypertrophy. Left ventricular diastolic parameters are consistent with Grade I diastolic dysfunction (impaired relaxation). Elevated left ventricular end-diastolic pressure. Right Ventricle: The right ventricular size is normal. No increase in right ventricular wall thickness. Right ventricular systolic function is normal. There is normal pulmonary artery systolic pressure. The tricuspid regurgitant velocity is 2.67 m/s, and  with an assumed right atrial pressure of 3 mmHg, the estimated right ventricular systolic pressure is 31.5  mmHg. Left Atrium: Left atrial size was normal in size. Right Atrium: Right atrial size was normal in size. Pericardium: There is no evidence of pericardial effusion. Mitral Valve: The mitral valve is normal in structure. Trivial mitral valve regurgitation. No evidence of mitral valve stenosis. Tricuspid Valve: The tricuspid valve is normal in structure. Tricuspid valve regurgitation is trivial. No evidence of tricuspid stenosis. Aortic Valve: The aortic valve is calcified. Aortic valve regurgitation is not visualized. Mild to moderate aortic valve sclerosis/calcification is present, without any evidence of aortic stenosis. Pulmonic Valve: The pulmonic valve was normal in structure. Pulmonic valve regurgitation is not visualized. No evidence of pulmonic stenosis. Aorta: The aortic root is normal in size and structure. Venous: The inferior vena cava is normal in size with greater than 50% respiratory variability, suggesting right atrial pressure of 3 mmHg. IAS/Shunts: No atrial level shunt detected by color flow Doppler. Additional Comments: A device lead is visualized.  LEFT VENTRICLE PLAX 2D LVIDd:         5.10 cm  Diastology LVIDs:         4.50 cm  LV e' medial:    4.46 cm/s LV PW:         1.30 cm  LV  E/e' medial:  14.0 LV IVS:        1.30 cm  LV e' lateral:   4.24 cm/s LVOT diam:     2.00 cm  LV E/e' lateral: 14.7 LV SV:         60 LV SV Index:   34 LVOT Area:     3.14 cm  RIGHT VENTRICLE RV Basal diam:  2.70 cm RV S prime:     10.40 cm/s TAPSE (M-mode): 2.1 cm LEFT ATRIUM             Index       RIGHT ATRIUM           Index LA diam:        3.30 cm 1.85 cm/m  RA Area:     13.80 cm LA Vol (A2C):   35.5 ml 19.86 ml/m RA Volume:   37.60 ml  21.03 ml/m LA Vol (A4C):   29.6 ml 16.56 ml/m LA Biplane Vol: 35.7 ml 19.97 ml/m  AORTIC VALVE LVOT Vmax:   101.00 cm/s LVOT Vmean:  55.900 cm/s LVOT VTI:    0.192 m  AORTA Ao Root diam: 2.90 cm MITRAL VALVE               TRICUSPID VALVE MV Area (PHT): 3.08 cm    TR Peak grad:   28.5 mmHg MV Decel Time: 246 msec    TR Vmax:        267.00 cm/s MV E velocity: 62.40 cm/s MV A velocity: 73.30 cm/s  SHUNTS MV E/A ratio:  0.85        Systemic VTI:  0.19 m                            Systemic Diam: 2.00 cm Fransico Him MD Electronically signed by Fransico Him MD Signature Date/Time: 10/11/2020/3:22:47 PM    Final       Patient Profile     75 y.o. male with past medical history of ischemic cardiomyopathy, chronic systolic congestive heart failure, prior ventricular tachycardia, prior ICD, paroxysmal atrial fibrillation, hypertension, hyperlipidemia, previous abdominal aortic aneurysm repair, COPD admitted with recurrent ventricular tachycardia.    Assessment & Plan  1 ventricular tachycardia-patient remains in sinus rhythm today.  We will continue amiodarone and lidocaine.  Patient is scheduled for cardiac catheterization this morning to exclude significant coronary disease/ischemia contributing to ventricular arrhythmias.  Will need input from electrophysiology concerning outpatient antiarrhythmics.    2 ischemic cardiomyopathy-repeat echocardiogram shows ejection fraction 30 to 35%.  ICD in place.  Continue Entresto and carvedilol.  We will titrate as tolerated by  blood pressure.  We will add Jardiance 10 mg daily prior to discharge and resume spironolactone.  I will not resume hydralazine/nitrates unless blood pressure remains high following titration of carvedilol and Entresto.    3 chronic systolic congestive heart failure-euvolemic on examination.  We will continue to follow.  4 history of paroxysmal atrial fibrillation-patient is in sinus rhythm.  Continue beta-blocker.  Resume Xarelto once all procedures complete.  5 coronary artery disease-continue statin.  He is not on aspirin at this point given need for anticoagulation.  For questions or updates, please contact [place] Please consult [email or web address] for contact info under        Signed, Kirk Ruths, MD  10/12/2020, 7:14 AM

## 2020-10-12 NOTE — Telephone Encounter (Signed)
Left message on voicemail-- per Dr Tresa Res, Denice Bors, MD  You 2 days ago    Agree with primary care eval  Kirk Ruths   Any question may call back if needed

## 2020-10-13 DIAGNOSIS — I255 Ischemic cardiomyopathy: Secondary | ICD-10-CM

## 2020-10-13 DIAGNOSIS — I2510 Atherosclerotic heart disease of native coronary artery without angina pectoris: Secondary | ICD-10-CM | POA: Diagnosis not present

## 2020-10-13 DIAGNOSIS — I5020 Unspecified systolic (congestive) heart failure: Secondary | ICD-10-CM | POA: Diagnosis not present

## 2020-10-13 DIAGNOSIS — I472 Ventricular tachycardia: Secondary | ICD-10-CM | POA: Diagnosis not present

## 2020-10-13 LAB — BASIC METABOLIC PANEL
Anion gap: 11 (ref 5–15)
BUN: 8 mg/dL (ref 8–23)
CO2: 22 mmol/L (ref 22–32)
Calcium: 8.3 mg/dL — ABNORMAL LOW (ref 8.9–10.3)
Chloride: 101 mmol/L (ref 98–111)
Creatinine, Ser: 0.95 mg/dL (ref 0.61–1.24)
GFR, Estimated: >60 mL/min (ref >60)
Glucose, Bld: 107 mg/dL — ABNORMAL HIGH (ref 70–99)
Potassium: 4.2 mmol/L (ref 3.5–5.1)
Sodium: 134 mmol/L — ABNORMAL LOW (ref 135–145)

## 2020-10-13 LAB — CBC
HCT: 37.9 % — ABNORMAL LOW (ref 39.0–52.0)
Hemoglobin: 12.9 g/dL — ABNORMAL LOW (ref 13.0–17.0)
MCH: 31.2 pg (ref 26.0–34.0)
MCHC: 34 g/dL (ref 30.0–36.0)
MCV: 91.8 fL (ref 80.0–100.0)
Platelets: 192 10*3/uL (ref 150–400)
RBC: 4.13 MIL/uL — ABNORMAL LOW (ref 4.22–5.81)
RDW: 14.9 % (ref 11.5–15.5)
WBC: 12.2 10*3/uL — ABNORMAL HIGH (ref 4.0–10.5)
nRBC: 0 % (ref 0.0–0.2)

## 2020-10-13 LAB — HEPARIN LEVEL (UNFRACTIONATED): Heparin Unfractionated: 0.3 IU/mL (ref 0.30–0.70)

## 2020-10-13 LAB — APTT: aPTT: 74 seconds — ABNORMAL HIGH (ref 24–36)

## 2020-10-13 MED ORDER — EMPAGLIFLOZIN 10 MG PO TABS
10.0000 mg | ORAL_TABLET | Freq: Every day | ORAL | Status: DC
  Administered 2020-10-13 – 2020-10-16 (×4): 10 mg via ORAL
  Filled 2020-10-13 (×5): qty 1

## 2020-10-13 MED ORDER — RIVAROXABAN 20 MG PO TABS
20.0000 mg | ORAL_TABLET | Freq: Every day | ORAL | Status: DC
  Administered 2020-10-13 – 2020-10-15 (×3): 20 mg via ORAL
  Filled 2020-10-13 (×4): qty 1

## 2020-10-13 MED ORDER — SPIRONOLACTONE 12.5 MG HALF TABLET
12.5000 mg | ORAL_TABLET | Freq: Every day | ORAL | Status: DC
  Administered 2020-10-13 – 2020-10-16 (×4): 12.5 mg via ORAL
  Filled 2020-10-13 (×4): qty 1

## 2020-10-13 MED ORDER — SACUBITRIL-VALSARTAN 97-103 MG PO TABS
1.0000 | ORAL_TABLET | Freq: Two times a day (BID) | ORAL | Status: DC
  Administered 2020-10-13 – 2020-10-16 (×7): 1 via ORAL
  Filled 2020-10-13 (×9): qty 1

## 2020-10-13 MED FILL — Verapamil HCl IV Soln 2.5 MG/ML: INTRAVENOUS | Qty: 2 | Status: AC

## 2020-10-13 MED FILL — Nitroglycerin IV Soln 100 MCG/ML in D5W: INTRA_ARTERIAL | Qty: 10 | Status: AC

## 2020-10-13 NOTE — Progress Notes (Addendum)
Progress Note  Patient Name: Benjamin Cook Date of Encounter: 10/13/2020  [facility] Cardiologist: Kirk Ruths, MD   Subjective   Feels ok, hopes to go home soon, no CP, no SOB  Inpatient Medications    Scheduled Meds:  amiodarone  400 mg Oral BID   aspirin  81 mg Oral Daily   atorvastatin  80 mg Oral QPM   carvedilol  6.25 mg Oral BID WC   Chlorhexidine Gluconate Cloth  6 each Topical Daily   empagliflozin  10 mg Oral Daily   mouth rinse  15 mL Mouth Rinse BID   mexiletine  200 mg Oral Q12H   mometasone-formoterol  2 puff Inhalation BID   omega-3 acid ethyl esters  1 g Oral Daily   pantoprazole (PROTONIX) IV  40 mg Intravenous Q12H   rivaroxaban  20 mg Oral Q supper   sacubitril-valsartan  1 tablet Oral BID   sodium chloride flush  3 mL Intravenous Q12H   spironolactone  12.5 mg Oral Daily   Continuous Infusions:  sodium chloride     PRN Meds: sodium chloride, acetaminophen, feeding supplement, morphine injection, nitroGLYCERIN, ondansetron (ZOFRAN) IV, sodium chloride flush   Vital Signs    Vitals:   10/13/20 0600 10/13/20 0726 10/13/20 0738 10/13/20 0800  BP: 138/72   131/63  Pulse: 64   62  Resp: (!) 23   (!) 24  Temp:  97.8 F (36.6 C)    TempSrc:  Oral    SpO2: 95%  96% 96%  Weight:      Height:        Intake/Output Summary (Last 24 hours) at 10/13/2020 0903 Last data filed at 10/13/2020 0600 Gross per 24 hour  Intake 796.36 ml  Output 250 ml  Net 546.36 ml   Last 3 Weights 10/13/2020 10/12/2020 10/10/2020  Weight (lbs) 137 lb 2 oz 138 lb 3.7 oz 138 lb 14.2 oz  Weight (kg) 62.2 kg 62.7 kg 63 kg      Telemetry    APacing/SR, no VT- Personally Reviewed  ECG    No new EKGs - Personally Reviewed  Physical Exam   Exam is unchanged GEN: No acute distress.   Neck: No JVD Cardiac: RRR, no murmurs, rubs, or gallops.  Respiratory: very soft exp wheeze b/l. GI: Soft, nontender, non-distended  MS: No edema; No deformity. Neuro:  Nonfocal   Psych: Normal affect   Labs    High Sensitivity Troponin:  No results for input(s): TROPONINIHS in the last 720 hours.    Chemistry Recent Labs  Lab 10/10/20 2023 10/11/20 1101 10/12/20 0537 10/13/20 0223  NA 137 134* 135 134*  K 4.3 4.1 3.7 4.2  CL 103 102 104 101  CO2 '26 25 24 22  '$ GLUCOSE 104* 101* 114* 107*  BUN '13 13 9 8  '$ CREATININE 1.19 1.04 0.98 0.95  CALCIUM 9.3 9.1 8.7* 8.3*  PROT 7.1 6.7  --   --   ALBUMIN 3.4* 3.1*  --   --   AST 58* 49*  --   --   ALT 77* 64*  --   --   ALKPHOS 70 61  --   --   BILITOT 0.7 0.5  --   --   GFRNONAA >60 >60 >60 >60  ANIONGAP '8 7 7 11     '$ Hematology Recent Labs  Lab 10/11/20 1101 10/12/20 0537 10/13/20 0223  WBC 9.5 13.2* 12.2*  RBC 4.25 4.39 4.13*  HGB 13.6 13.9 12.9*  HCT 39.6 40.1 37.9*  MCV 93.2 91.3 91.8  MCH 32.0 31.7 31.2  MCHC 34.3 34.7 34.0  RDW 15.3 14.9 14.9  PLT 232 204 192    BNPNo results for input(s): BNP, PROBNP in the last 168 hours.   DDimer No results for input(s): DDIMER in the last 168 hours.   Radiology    CT ABDOMEN PELVIS W CONTRAST Result Date: 10/10/2020 CLINICAL DATA:  Abdominal pain, acute, nonlocalized EXAM: CT ABDOMEN AND PELVIS WITH CONTRAST TECHNIQUE: Multidetector CT imaging of the abdomen and pelvis was performed using the standard protocol following bolus administration of intravenous contrast. CONTRAST:  100 cc Omnipaque 300 COMPARISON:  04/04/2017 FINDINGS: Lower chest: There is stable left lower lobe bronchiectasis and mucoid impaction with associated parenchymal scarring and volume loss. No pleural effusion. Pacemaker leads are seen within the right heart. Left ventricular dilation is noted with stable thinning of the septal and apical wall in keeping with prior myocardial infarction. Trace pericardial effusion. Hepatobiliary: No focal liver abnormality is seen. No gallstones, gallbladder wall thickening, or biliary dilatation. Pancreas: Unremarkable Spleen: Unremarkable  Adrenals/Urinary Tract: Stable 3.3 x 4.4 cm heterogeneously enhancing right adrenal mass, stable since remote prior examination of 10/14/2003, likely representing a benign adrenal adenoma or myelolipoma. Left adrenal gland is unremarkable. The kidneys are normal in size and position. Scattered simple cortical cysts are seen within the kidneys bilaterally. The kidneys are otherwise unremarkable. The prostate gland is centrally enlarged and protrudes into the bladder lumen. The bladder is not distended. Stomach/Bowel: Moderate sigmoid diverticulosis. Unremarkable loops of small bowel are seen within small bilateral inguinal hernias. The stomach, small bowel, and large bowel are otherwise unremarkable. Appendix normal. No free intraperitoneal gas or fluid. Vascular/Lymphatic: Short segment focal dissection of the a suprarenal aorta is again identified with a patent false lumen noted posteriorly. No evidence of rupture or periaortic inflammatory change. Infrarenal abdominal aortic aneurysm has undergone endovascular repair utilizing a bifurcated stent graft demonstrating infrarenal proximal fixation and bilateral common iliac distal landing zones. Small mural thrombus is again identified within the a left common iliac limb, unchanged, without evidence of hemodynamically significant stenosis. The aneurysm sac is unchanged measuring 4.7 x 5.0 cm at axial image # 36/3. Extensive atherosclerotic calcification noted within the a mesenteric and renal vasculature, however, the degree of stenosis is not well assessed on this examination. 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 No pathologic adenopathy within the abdomen and pelvis. Reproductive: There is marked prostatic enlargement, particularly centrally which protrudes into the bladder lumen. This appears similar to prior examination. Other: Small bilateral inguinal hernias are present. Rectum unremarkable. Musculoskeletal: Degenerative changes are  seen within the lumbar spine. No lytic or blastic bone lesions are identified. IMPRESSION: No acute intra-abdominal pathology identified. No definite radiographic explanation for the patient's reported symptoms. Stable left basilar bronchiectasis with superimposed mucoid impaction and volume loss. Stable 4.4 cm right adrenal mass since remote prior examination of 10/14/2003 likely representing a benign adrenal adenoma or myelolipoma. Moderate sigmoid diverticulosis. 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 Marked prostatic enlargement without evidence of bladder outlet obstruction. Small bilateral inguinal hernias containing small bowel without evidence of obstruction. Aortic aneurysm NOS (ICD10-I71.9). Aortic Atherosclerosis (ICD10-I70.0). Electronically Signed   By: Fidela Salisbury M.D.   On: 10/10/2020  23:21   DG Chest Portable 1 View Result Date: 10/10/2020 CLINICAL DATA:  Weakness.  Abdominal pain and vomiting. EXAM: PORTABLE CHEST 1 VIEW COMPARISON:  06/01/2020, remote CT 06/03/2007 FINDINGS: Multi lead left-sided pacemaker in place. The heart is normal in size. Aortic atherosclerosis. The lungs are hyperinflated. Central bronchial thickening. No emphysema seen on prior CT. Streaky left lung base scarring. No acute airspace disease. No pulmonary edema. No pleural effusion or pneumothorax. No visualized pulmonary mass or nodule. No acute osseous abnormalities are seen. IMPRESSION: Hyperinflation and central bronchial thickening, imaging findings suggesting COPD. No focal airspace disease. Electronically Signed   By: Keith Rake M.D.   On: 10/10/2020 21:19      Cardiac Studies   10/12/20: LHC Mid Cx lesion is 70% stenosed.   Ost LM to Dist LM lesion is 75%  stenosed.   Mid RCA lesion is 70% stenosed.   Prox RCA to Mid RCA lesion is 50% stenosed.   Previously placed Mid LAD stent (unknown type) is  widely patent. IMPRESSION: Mr. Sciacca has 75% calcified distal left main.  His previously placed mid LAD stent is widely patent.  He does have a 70% mid AV groove circumflex and RCA disease.  I do not think he is an operative candidate.  It is certainly possible that this is contributory to his ventricular tachycardia.  Medical therapy he will be recommended.  The femoral sheath was removed and pressure was held on the groin to achieve hemostasis.  The patient left the lab in stable condition.  Dr. [name] was notified of these results.  11/23/2019: stress myoview Nuclear stress EF: 22%. No T wave inversion was noted during stress. There was no ST segment deviation noted during stress. Defect 1: There is a large defect of severe severity present in the basal inferior, mid anteroseptal, mid inferoseptal, mid inferior, apical septal, apical inferior, apical lateral and apex location. Findings consistent with prior myocardial infarction with peri-infarct ischemia. This is a high risk study. The left ventricular ejection fraction is severely decreased (<30%).   Large size, severe severity mostly fixed (SDS 3) apical to mid inferior, apical, apical septal and anteroapical perfusion defect suggestive of mid to distal LAD and RCA territory scar with minimal peri-infarct ischemia. LVEF 22% with akinesis of the previously mentioned walls. This is a high risk study. Compared to a prior study on 12/21/2012, there is no significant change.       04/09/2019; TTE IMPRESSIONS   1. Left ventricular ejection fraction, by estimation, is 35%%. The left  ventricle has moderate to severely decreased function. There is mild left  ventricular hypertrophy. Left ventricular diastolic parameters are  consistent with Grade I diastolic  dysfunction (impaired relaxation).   2.  Right ventricular systolic function is normal. The right ventricular  size is normal.   3. The mitral valve is normal in structure and function. Trivial mitral  valve regurgitation.   4. The aortic valve is abnormal. Aortic valve regurgitation is trivial.  Mild aortic valve sclerosis is present, with no evidence of aortic valve  stenosis.   5. The inferior vena cava is normal in size with greater than 50%  respiratory variability, suggesting right atrial pressure of 3 mmHg.   Patient Profile     75 y.o. male Benjamin Cook is a 75 y.o. male with a hx of CAD (s/p PCI to LAD 2001), ICM, chronic CHF (systolic), ICD, HTN, HLD, VT, AFib, AAA (s/p endovascular repair, Dr. Donzetta Matters), COPD, VT who is  being seen 10/11/2020 for the evaluation of VT at the request of Dr. Hal Hope.     Device information SJM dual chamber ICD,  Implanted 2012, gen change 03/18/2014 Has an abandoned RV lead (fracture), new lead placed 2018 Known A lead noise   04/21/2019 @ 9:31PM VT episode This was a true MMVT, 203bpm, one ATP delivered failed, , one HV therapy, 25J with clean break ATP therapies were added 10/30/19: VT w/HV tx (pt preferred to hold off AAD), stress with only peri-infarct ischemia, not felt to need cath   06/01/20: VT storm 74 ATP  Therapies and 14 HV therapies All appropriate All were MMVT episodes, but did have two different VT's Largely ATP failed though a couple did work Also a couple ATPs that slowed his VT to below detection and labeled return to sinus AMIODARONE started Additional VT zone added 166bpm (With no anginal symptoms and recent stable stress test cath not pursued  10/11/20: recurrent VT (rates 130's) under detection Amiodarone IV >> reloaded PO ADDED mexiletine 1st VT zone rate reduced to 120bpm NID 100 and with 10 spins of ATP prior to shock  Assessment & Plan    VT Known hx of VT, unable to see last night device check but note reports nothing detected or treated by his  device Check mag VT rates well below his 1st detection zone None further sine yesterday AM  Discussed with Dr. Caryl Comes who will see the patient this morning Continue amiodarone re-load and mexiletine Maximize antianginal/CAD therapies as able EP follow up is in place     2. CAD No anginal symptoms No trops drawn Stable stress test last year Cath noted with multivessel disease, not felt an operative candidate Felt his CAD may explain his VT     3. ICM 4. Chronic CHF (systolic) By exam, does not appear overtly volume OL He reports no symptoms of PND, sleeps with 2 pills chronically Gets SOB walking down the hall in his home, and to his truck from the house, will need to rest Reports riding lawn mower/cuts grass every day Exertional capacity unchanged for at least 48mo maybe more Echo with elevated LVEDP By exam does not appear volume OL    5. Paroxysmal Afib CHA2DS2Vasc is 5, on Xarelto SR here    6. Abdominal c/o CT with a number of chronic/stable findings Pt connected symptom to hydralazine, this was stopped IM team managing PPI started     For questions or updates, please contact CMascoutahPlease consult [email or web address] for contact info under        Signed, RBaldwin Jamaica PA-C  10/13/2020, 9:03 AM    No interval vt Continue amio and mex And aggressive antiischemic therapies  Discussed with Dr BLondon Sheer

## 2020-10-13 NOTE — Progress Notes (Signed)
ANTICOAGULATION CONSULT NOTE  Pharmacy Consult for heparin Indication: atrial fibrillation  No Known Allergies  Patient Measurements: Height: '5\' 10"'$  (177.8 cm) Weight: 62.7 kg (138 lb 3.7 oz) IBW/kg (Calculated) : 73 Heparin Dosing Weight: 63kg  Vital Signs: Temp: 98.6 F (37 C) (09/08 1924) Temp Source: Oral (09/08 1924) BP: 131/62 (09/09 0300) Pulse Rate: 61 (09/09 0300)  Labs: Recent Labs    10/11/20 1101 10/11/20 2009 10/12/20 0537 10/13/20 0223  HGB 13.6  --  13.9 12.9*  HCT 39.6  --  40.1 37.9*  PLT 232  --  204 192  APTT  --  50* 86* 74*  HEPARINUNFRC  --   --  0.55 0.30  CREATININE 1.04  --  0.98 0.95     Estimated Creatinine Clearance: 57.8 mL/min (by C-G formula based on SCr of 0.95 mg/dL).   Medical History: Past Medical History:  Diagnosis Date   Adrenal mass ([place])    per pt this is remote (10 years) and benign by biopsy   AICD (automatic cardioverter/defibrillator) present    Anxiety    Arthritis    CAD (coronary artery disease)    a. s/p PCI to LAD 2001 b. myoview 2014 high risk with scar LAD/RCA territory but no ischemia   Cardiomyopathy, ischemic    CHF (congestive heart failure) ([name])    class II/III   COPD (chronic obstructive pulmonary disease) ([place])    smokes cigars but has quit cigarettes   Defibrillator discharge 04/21/2019   Diverticulitis    Dyspnea    Flu 03/2015   HTN (hypertension)    Hyperlipidemia    NSVT (nonsustained ventricular tachycardia) ([place]) 03/05/2017   Paroxysmal atrial fibrillation ([place]) 03/2015   chads2vasc score is 4   PSA (psoriatic arthritis) ([name])    increased   PVD (peripheral vascular disease) ([name])     Assessment: 52 yoM with hx AF admitted with VT. Pt on Xarelto at home (last dose 9/5 pm). Xarelto was swapped to heparin with need for cath. Patient is now status post cath on 9/8 with heparin held since this morning. Heparin will be resumed 8 hours from sheath removal at 1830. CBC stable this  morning.  The aPTT this morning was at goal 74s, heparin level at low end of goal at 0.3. Levels appear to correlate will stop aptt checks and follow up plans to transition back to PTA Xarelto.    Goal of Therapy:  Heparin level 0.3-0.7 units/ml aPTT 66-102 seconds Monitor platelets by anticoagulation protocol: Yes   Plan:  Continue IV heparin at 1050 units/hr. Daily heparin level, CBC  Thank you for allowing pharmacy to participate in this patient's care.  Erin Hearing PharmD., BCPS Clinical Pharmacist 10/13/2020 3:34 AM

## 2020-10-13 NOTE — Progress Notes (Signed)
Progress Note  Patient Name: Benjamin Cook Date of Encounter: 10/13/2020  [facility] Cardiologist: Kirk Ruths, MD   Subjective   No CP or dyspnea  Inpatient Medications    Scheduled Meds:  amiodarone  400 mg Oral BID   aspirin  81 mg Oral Daily   atorvastatin  80 mg Oral QPM   carvedilol  6.25 mg Oral BID WC   Chlorhexidine Gluconate Cloth  6 each Topical Daily   mouth rinse  15 mL Mouth Rinse BID   mexiletine  200 mg Oral Q12H   mometasone-formoterol  2 puff Inhalation BID   omega-3 acid ethyl esters  1 g Oral Daily   pantoprazole (PROTONIX) IV  40 mg Intravenous Q12H   sacubitril-valsartan  1 tablet Oral BID   sodium chloride flush  3 mL Intravenous Q12H   Continuous Infusions:  sodium chloride     heparin 1,050 Units/hr (10/13/20 0600)   PRN Meds: sodium chloride, acetaminophen, feeding supplement, morphine injection, nitroGLYCERIN, ondansetron (ZOFRAN) IV, sodium chloride flush   Vital Signs    Vitals:   10/13/20 0600 10/13/20 0726 10/13/20 0738 10/13/20 0800  BP: 138/72   131/63  Pulse: 64   62  Resp: (!) 23   (!) 24  Temp:  97.8 F (36.6 C)    TempSrc:  Oral    SpO2: 95%  96% 96%  Weight:      Height:        Intake/Output Summary (Last 24 hours) at 10/13/2020 0831 Last data filed at 10/13/2020 0600 Gross per 24 hour  Intake 796.36 ml  Output 250 ml  Net 546.36 ml    Last 3 Weights 10/13/2020 10/12/2020 10/10/2020  Weight (lbs) 137 lb 2 oz 138 lb 3.7 oz 138 lb 14.2 oz  Weight (kg) 62.2 kg 62.7 kg 63 kg      Telemetry    Sinus with intermittent V pacing - Personally Reviewed  Physical Exam   GEN: NAD, WD, frail Neck: supple, no JVD Cardiac: RRR Respiratory: CTA; no wheeze GI: Soft, NT ND, no masses MS: No edema, radial cath site with no hematoma Neuro:  No focal findings Psych: Normal affect   Labs    Chemistry Recent Labs  Lab 10/10/20 2023 10/11/20 1101 10/12/20 0537 10/13/20 0223  NA 137 134* 135 134*  K 4.3 4.1 3.7  4.2  CL 103 102 104 101  CO2 '26 25 24 22  '$ GLUCOSE 104* 101* 114* 107*  BUN '13 13 9 8  '$ CREATININE 1.19 1.04 0.98 0.95  CALCIUM 9.3 9.1 8.7* 8.3*  PROT 7.1 6.7  --   --   ALBUMIN 3.4* 3.1*  --   --   AST 58* 49*  --   --   ALT 77* 64*  --   --   ALKPHOS 70 61  --   --   BILITOT 0.7 0.5  --   --   GFRNONAA >60 >60 >60 >60  ANIONGAP '8 7 7 11      '$ Hematology Recent Labs  Lab 10/11/20 1101 10/12/20 0537 10/13/20 0223  WBC 9.5 13.2* 12.2*  RBC 4.25 4.39 4.13*  HGB 13.6 13.9 12.9*  HCT 39.6 40.1 37.9*  MCV 93.2 91.3 91.8  MCH 32.0 31.7 31.2  MCHC 34.3 34.7 34.0  RDW 15.3 14.9 14.9  PLT 232 204 192     Radiology    CARDIAC CATHETERIZATION  Result Date: 10/12/2020   Mid Cx lesion is 70% stenosed.   Colon Flattery  LM to Dist LM lesion is 75% stenosed.   Mid RCA lesion is 70% stenosed.   Prox RCA to Mid RCA lesion is 50% stenosed.   Previously placed Mid LAD stent (unknown type) is  widely patent. DEMBA Cook is a 75 y.o. male  [id number] LOCATION:  FACILITY: [facility] PHYSICIAN: Quay Burow, M.D. 05-09-1945 DATE OF PROCEDURE:  10/12/2020 DATE OF DISCHARGE: CARDIAC CATHETERIZATION History obtained from chart review.75 y.o. male with past medical history of ischemic cardiomyopathy, chronic systolic congestive heart failure, prior ventricular tachycardia, prior ICD, paroxysmal atrial fibrillation, hypertension, hyperlipidemia, previous abdominal aortic aneurysm repair, COPD admitted with recurrent ventricular tachycardia.  He has a history of LAD stenting back in 2001.  He has LV dysfunction with a prior Myoview that showed inferoapical apical and anteroapical scar.  He was admitted with ventricular tachycardia.  He has a history of A. fib as well.  He was on amiodarone and lidocaine and converted to sinus rhythm.  Plans were to convert to mexiletine instead of lidocaine.  We were asked to perform coronary angiography to determine if there was an ischemic etiology. PROCEDURE DESCRIPTION: The patient  was brought to the second floor [facility] cath lab in the postabsorptive state. He was premedicated with IV Versed and fentanyl. His right groinwas prepped and shaved in usual sterile fashion. Xylocaine 1% was used for local anesthesia. A 5 French sheath was inserted into the right common femoral  artery using standard Seldinger technique.  I initially try to access the right radial artery under ultrasound guidance unsuccessfully.  5 French right left Judkins diagnostic catheters were used for selective coronary angiography.  Catheter exchanges were performed over a 260 cm 035 J-wire.  Isovue dye was used for the entirety of the case (55 cc of contrast administered to the patient).  Retrograde aortic pressures monitored in the case.   Mr. Rozeboom has 75% calcified distal left main.  His previously placed mid LAD stent is widely patent.  He does have a 70% mid AV groove circumflex and RCA disease.  I do not think he is an operative candidate.  It is certainly possible that this is contributory to his ventricular tachycardia.  Medical therapy he will be recommended.  The femoral sheath was removed and pressure was held on the groin to achieve hemostasis.  The patient left the lab in stable condition.  Dr. [name] was notified of these results. Quay Burow. MD, [facility] 10/12/2020 9:41 AM    ECHOCARDIOGRAM COMPLETE  Result Date: 10/11/2020    ECHOCARDIOGRAM REPORT   Patient Name:   Benjamin Cook Date of Exam: 10/11/2020 Medical Rec #:  [id number]         Height:       70.0 in Accession #:    [id number]        Weight:       138.9 lb Date of Birth:  [birth year]         BSA:          1.788 m Patient Age:    75 years          BP:           164/77 mmHg Patient Gender: M                 HR:           59 bpm. Exam Location:  Inpatient Procedure: 2D Echo, Cardiac Doppler, Color Doppler and Intracardiac  Opacification Agent Indications:    Ventricular tachycardia  History:        Patient has prior history  of Echocardiogram examinations, most                 recent 04/09/2019. CHF and Cardiomyopathy, CAD, Defibrillator,                 COPD and PVD, Arrythmias:Vtach and Atrial Fibrillation,                 Signs/Symptoms:Chest Pain; Risk Factors:Former Smoker.  Sonographer:    Dustin Flock RDCS Referring Phys: [id number] [place]  1. Left ventricular ejection fraction, by estimation, is 30 to 35%. The left ventricle has moderately decreased function. The left ventricle has no regional wall motion abnormalities. There is mild concentric left ventricular hypertrophy. Left ventricular diastolic parameters are consistent with Grade I diastolic dysfunction (impaired relaxation). Elevated left ventricular end-diastolic pressure. There is akinesis of the left ventricular, mid inferoseptal wall and anteroseptal wall. There is akinesis of the left ventricular, apical septal wall, inferior wall and apical segment. There is severe hypokinesis of the left ventricular, mid-apical anterior wall.  2. Right ventricular systolic function is normal. The right ventricular size is normal. There is normal pulmonary artery systolic pressure. The estimated right ventricular systolic pressure is 0000000 mmHg.  3. The mitral valve is normal in structure. Trivial mitral valve regurgitation. No evidence of mitral stenosis.  4. The aortic valve is calcified. Aortic valve regurgitation is not visualized. Mild to moderate aortic valve sclerosis/calcification is present, without any evidence of aortic stenosis.  5. The inferior vena cava is normal in size with greater than 50% respiratory variability, suggesting right atrial pressure of 3 mmHg.  6. There is very sluggish flow in the apical LV which increases risk of apical LV thrombus formation. FINDINGS  Left Ventricle: Left ventricular ejection fraction, by estimation, is 30 to 35%. The left ventricle has moderately decreased function. The left ventricle has no regional wall  motion abnormalities. Severe hypokinesis of the left ventricular, mid-apical anterior wall. Definity contrast agent was given IV to delineate the left ventricular endocardial borders. The left ventricular internal cavity size was normal in size. There is mild concentric left ventricular hypertrophy. Left ventricular diastolic parameters are consistent with Grade I diastolic dysfunction (impaired relaxation). Elevated left ventricular end-diastolic pressure. Right Ventricle: The right ventricular size is normal. No increase in right ventricular wall thickness. Right ventricular systolic function is normal. There is normal pulmonary artery systolic pressure. The tricuspid regurgitant velocity is 2.67 m/s, and  with an assumed right atrial pressure of 3 mmHg, the estimated right ventricular systolic pressure is 0000000 mmHg. Left Atrium: Left atrial size was normal in size. Right Atrium: Right atrial size was normal in size. Pericardium: There is no evidence of pericardial effusion. Mitral Valve: The mitral valve is normal in structure. Trivial mitral valve regurgitation. No evidence of mitral valve stenosis. Tricuspid Valve: The tricuspid valve is normal in structure. Tricuspid valve regurgitation is trivial. No evidence of tricuspid stenosis. Aortic Valve: The aortic valve is calcified. Aortic valve regurgitation is not visualized. Mild to moderate aortic valve sclerosis/calcification is present, without any evidence of aortic stenosis. Pulmonic Valve: The pulmonic valve was normal in structure. Pulmonic valve regurgitation is not visualized. No evidence of pulmonic stenosis. Aorta: The aortic root is normal in size and structure. Venous: The inferior vena cava is normal in size with greater than 50% respiratory variability, suggesting right atrial pressure  of 3 mmHg. IAS/Shunts: No atrial level shunt detected by color flow Doppler. Additional Comments: A device lead is visualized.  LEFT VENTRICLE PLAX 2D LVIDd:          5.10 cm  Diastology LVIDs:         4.50 cm  LV e' medial:    4.46 cm/s LV PW:         1.30 cm  LV E/e' medial:  14.0 LV IVS:        1.30 cm  LV e' lateral:   4.24 cm/s LVOT diam:     2.00 cm  LV E/e' lateral: 14.7 LV SV:         60 LV SV Index:   34 LVOT Area:     3.14 cm  RIGHT VENTRICLE RV Basal diam:  2.70 cm RV S prime:     10.40 cm/s TAPSE (M-mode): 2.1 cm LEFT ATRIUM             Index       RIGHT ATRIUM           Index LA diam:        3.30 cm 1.85 cm/m  RA Area:     13.80 cm LA Vol (A2C):   35.5 ml 19.86 ml/m RA Volume:   37.60 ml  21.03 ml/m LA Vol (A4C):   29.6 ml 16.56 ml/m LA Biplane Vol: 35.7 ml 19.97 ml/m  AORTIC VALVE LVOT Vmax:   101.00 cm/s LVOT Vmean:  55.900 cm/s LVOT VTI:    0.192 m  AORTA Ao Root diam: 2.90 cm MITRAL VALVE               TRICUSPID VALVE MV Area (PHT): 3.08 cm    TR Peak grad:   28.5 mmHg MV Decel Time: 246 msec    TR Vmax:        267.00 cm/s MV E velocity: 62.40 cm/s MV A velocity: 73.30 cm/s  SHUNTS MV E/A ratio:  0.85        Systemic VTI:  0.19 m                            Systemic Diam: 2.00 cm Fransico Him MD Electronically signed by Fransico Him MD Signature Date/Time: 10/11/2020/3:22:47 PM    Final       Patient Profile     75 y.o. male with past medical history of ischemic cardiomyopathy, chronic systolic congestive heart failure, prior ventricular tachycardia, prior ICD, paroxysmal atrial fibrillation, hypertension, hyperlipidemia, previous abdominal aortic aneurysm repair, COPD admitted with recurrent ventricular tachycardia.    Assessment & Plan    1 ventricular tachycardia-patient in sinus today.  Continue amiodarone 400 mg twice daily for 2 weeks then decrease to 400 mg daily for 2 weeks then 200 mg daily thereafter.  Continue mexiletine.    2 ischemic cardiomyopathy-echocardiogram shows ejection fraction 30 to 35%.  ICD in place.  Continue Entresto (increased to 97/103 twice daily) and carvedilol.  We will titrate carvedilol as tolerated by blood  pressure as an outpatient.  We will add Jardiance 10 mg daily; resume spironolactone 12.5 mg daily.  I will not resume hydralazine/nitrates unless blood pressure remains high following titration of carvedilol and Entresto.    3 chronic systolic congestive heart failure-euvolemic on examination.  We will continue to follow.  4 history of paroxysmal atrial fibrillation-patient is in sinus rhythm.  Continue beta-blocker.  Discontinue IV heparin and resume  Xarelto 20 mg daily.  5 coronary artery disease-continue statin.  Note cardiac catheterization revealed 75% left main.  Given his overall medical condition he would be a borderline candidate for coronary artery bypass and graft.  He also states that he would not consider CABG.  We will treat medically.  Patient can be transferred to telemetry from a cardiac standpoint.  We will likely discharge tomorrow morning if stable.  For questions or updates, please contact [place] Please consult [email or web address] for contact info under        Signed, Kirk Ruths, MD  10/13/2020, 8:31 AM

## 2020-10-13 NOTE — Progress Notes (Signed)
[facility] PROGRESS NOTE    Progress Note  FAY STEFKO  [id number] DOB: [birth year] DOA: 10/10/2020 PCP: Merryl Hacker, No     Brief Narrative:   CHESNEY BERMUDEZ is an 75 y.o. male past medical history of CAD status post PCI, chronic systolic heart failure status post AICD placement, paroxysmal atrial fibrillation on Xarelto comes into the ED having nausea vomiting and abdominal discomfort has been going on for the last 4 weeks in the ER he was found to be in V. tach cardiology was consulted who cardioverted the patient, CT scan of the abdomen and pelvis was unremarkable mildly elevated LFTs hemoglobin of 13 COVID test was negative was started on amiodarone bolus and infusion  Assessment/Plan:   V tach ([place]): Status post left heart cath revealed 75% stenosis of left main.  Cardiology recommended given her overall condition he would be a borderline candidate for coronary artery bypass and graft.  It was discussed with the patient he would not like to proceed with CABG.  Cardiology has elected to treat medically. Electrophysiology is also on board, recommended to continue oral amiodarone and mexiletine. Try to maximize antianginal and CAD therapy. Check a magnesium level and replete as needed. VT rate will be set low the first detection zone.  Ischemic cardiomyopathy/chronic systolic heart failure: With a 2D echo showing an EF of 30% AICD in place.  The cardiac cath that showed 75% stenosis to the left main Currently on Entresto, Jardiance, Aldactone and Coreg.  Titrate as tolerated per cardiology. Appears euvolemic on physical exam.  History of chronic normocytic anemia: No signs of oral bleeding continue to follow CBC.  Paroxysmal atrial fibrillation ([name]) Rate controlled on beta-blocker and amiodarone can be restarted on Xarelto once procedures are completed.  History of abdominal pain: Patient related that his symptoms were related to his hydralazine this was  stopped. Continue PPI no further work-up.  DVT prophylaxis: Lovenox Family Communication: Patient's wife Status is: Inpatient  Remains inpatient appropriate because:Hemodynamically unstable  Dispo: The patient is from: Home              Anticipated d/c is to: SNF              Patient currently is not medically stable to d/c.   Difficult to place patient No        Code Status:     Code Status Orders  (From admission, onward)           Start     Ordered   10/11/20 0204  Full code  Continuous        10/11/20 0204           Code Status History     Date Active Date Inactive Code Status Order ID Comments User Context   06/01/2020 1746 06/04/2020 1900 Full Code [id number]  Baldwin Jamaica, PA-C ED   04/22/2019 0348 04/22/2019 2354 Full Code [id number]  Clois Dupes, MD ED   03/03/2017 1329 03/05/2017 1321 Full Code [id number]  Gabriel Earing, PA-C Inpatient   11/24/2016 1549 11/30/2016 1643 Full Code [id number]  Rondel Jumbo, PA-C ED   11/15/2016 1542 11/18/2016 1923 Full Code [id number]  Roxan Hockey, MD ED   07/16/2016 1951 07/18/2016 1431 Full Code [id number]  Leanor Kail, [place] Inpatient   03/28/2015 1643 03/30/2015 1525 Full Code [id number]  Waldemar Dickens, MD ED   05/27/2014 1514 05/27/2014 2037 Full Code [id number]  Sherren Mocha, MD Inpatient   03/18/2014 1132 03/18/2014 1629 Full  Code [id number]  Thompson Grayer, MD Inpatient         IV Access:   Peripheral IV   Procedures and diagnostic studies:   CARDIAC CATHETERIZATION  Result Date: 10/12/2020   Mid Cx lesion is 70% stenosed.   Ost LM to Dist LM lesion is 75% stenosed.   Mid RCA lesion is 70% stenosed.   Prox RCA to Mid RCA lesion is 50% stenosed.   Previously placed Mid LAD stent (unknown type) is  widely patent. ISIAC MACHIN is a 75 y.o. male  [id number] LOCATION:  FACILITY: [facility] PHYSICIAN: Quay Burow, M.D. August 26, 1945 DATE OF PROCEDURE:  10/12/2020 DATE OF DISCHARGE: CARDIAC CATHETERIZATION  History obtained from chart review.75 y.o. male with past medical history of ischemic cardiomyopathy, chronic systolic congestive heart failure, prior ventricular tachycardia, prior ICD, paroxysmal atrial fibrillation, hypertension, hyperlipidemia, previous abdominal aortic aneurysm repair, COPD admitted with recurrent ventricular tachycardia.  He has a history of LAD stenting back in 2001.  He has LV dysfunction with a prior Myoview that showed inferoapical apical and anteroapical scar.  He was admitted with ventricular tachycardia.  He has a history of A. fib as well.  He was on amiodarone and lidocaine and converted to sinus rhythm.  Plans were to convert to mexiletine instead of lidocaine.  We were asked to perform coronary angiography to determine if there was an ischemic etiology. PROCEDURE DESCRIPTION: The patient was brought to the second floor [facility] cath lab in the postabsorptive state. He was premedicated with IV Versed and fentanyl. His right groinwas prepped and shaved in usual sterile fashion. Xylocaine 1% was used for local anesthesia. A 5 French sheath was inserted into the right common femoral  artery using standard Seldinger technique.  I initially try to access the right radial artery under ultrasound guidance unsuccessfully.  5 French right left Judkins diagnostic catheters were used for selective coronary angiography.  Catheter exchanges were performed over a 260 cm 035 J-wire.  Isovue dye was used for the entirety of the case (55 cc of contrast administered to the patient).  Retrograde aortic pressures monitored in the case.   Mr. Wolverton has 75% calcified distal left main.  His previously placed mid LAD stent is widely patent.  He does have a 70% mid AV groove circumflex and RCA disease.  I do not think he is an operative candidate.  It is certainly possible that this is contributory to his ventricular tachycardia.  Medical therapy he will be recommended.  The femoral sheath was  removed and pressure was held on the groin to achieve hemostasis.  The patient left the lab in stable condition.  Dr. [name] was notified of these results. Quay Burow. MD, [facility] 10/12/2020 9:41 AM    ECHOCARDIOGRAM COMPLETE  Result Date: 10/11/2020    ECHOCARDIOGRAM REPORT   Patient Name:   STERLIN CLAUSS Date of Exam: 10/11/2020 Medical Rec #:  [id number]         Height:       70.0 in Accession #:    [id number]        Weight:       138.9 lb Date of Birth:  Jun 12, 1945         BSA:          1.788 m Patient Age:    59 years          BP:           164/77 mmHg Patient Gender: M  HR:           59 bpm. Exam Location:  Inpatient Procedure: 2D Echo, Cardiac Doppler, Color Doppler and Intracardiac            Opacification Agent Indications:    Ventricular tachycardia  History:        Patient has prior history of Echocardiogram examinations, most                 recent 04/09/2019. CHF and Cardiomyopathy, CAD, Defibrillator,                 COPD and PVD, Arrythmias:Vtach and Atrial Fibrillation,                 Signs/Symptoms:Chest Pain; Risk Factors:Former Smoker.  Sonographer:    Dustin Flock RDCS Referring Phys: [id number] [place]  1. Left ventricular ejection fraction, by estimation, is 30 to 35%. The left ventricle has moderately decreased function. The left ventricle has no regional wall motion abnormalities. There is mild concentric left ventricular hypertrophy. Left ventricular diastolic parameters are consistent with Grade I diastolic dysfunction (impaired relaxation). Elevated left ventricular end-diastolic pressure. There is akinesis of the left ventricular, mid inferoseptal wall and anteroseptal wall. There is akinesis of the left ventricular, apical septal wall, inferior wall and apical segment. There is severe hypokinesis of the left ventricular, mid-apical anterior wall.  2. Right ventricular systolic function is normal. The right ventricular size is normal. There is  normal pulmonary artery systolic pressure. The estimated right ventricular systolic pressure is 0000000 mmHg.  3. The mitral valve is normal in structure. Trivial mitral valve regurgitation. No evidence of mitral stenosis.  4. The aortic valve is calcified. Aortic valve regurgitation is not visualized. Mild to moderate aortic valve sclerosis/calcification is present, without any evidence of aortic stenosis.  5. The inferior vena cava is normal in size with greater than 50% respiratory variability, suggesting right atrial pressure of 3 mmHg.  6. There is very sluggish flow in the apical LV which increases risk of apical LV thrombus formation. FINDINGS  Left Ventricle: Left ventricular ejection fraction, by estimation, is 30 to 35%. The left ventricle has moderately decreased function. The left ventricle has no regional wall motion abnormalities. Severe hypokinesis of the left ventricular, mid-apical anterior wall. Definity contrast agent was given IV to delineate the left ventricular endocardial borders. The left ventricular internal cavity size was normal in size. There is mild concentric left ventricular hypertrophy. Left ventricular diastolic parameters are consistent with Grade I diastolic dysfunction (impaired relaxation). Elevated left ventricular end-diastolic pressure. Right Ventricle: The right ventricular size is normal. No increase in right ventricular wall thickness. Right ventricular systolic function is normal. There is normal pulmonary artery systolic pressure. The tricuspid regurgitant velocity is 2.67 m/s, and  with an assumed right atrial pressure of 3 mmHg, the estimated right ventricular systolic pressure is 0000000 mmHg. Left Atrium: Left atrial size was normal in size. Right Atrium: Right atrial size was normal in size. Pericardium: There is no evidence of pericardial effusion. Mitral Valve: The mitral valve is normal in structure. Trivial mitral valve regurgitation. No evidence of mitral valve  stenosis. Tricuspid Valve: The tricuspid valve is normal in structure. Tricuspid valve regurgitation is trivial. No evidence of tricuspid stenosis. Aortic Valve: The aortic valve is calcified. Aortic valve regurgitation is not visualized. Mild to moderate aortic valve sclerosis/calcification is present, without any evidence of aortic stenosis. Pulmonic Valve: The pulmonic valve was normal in structure. Pulmonic valve regurgitation  is not visualized. No evidence of pulmonic stenosis. Aorta: The aortic root is normal in size and structure. Venous: The inferior vena cava is normal in size with greater than 50% respiratory variability, suggesting right atrial pressure of 3 mmHg. IAS/Shunts: No atrial level shunt detected by color flow Doppler. Additional Comments: A device lead is visualized.  LEFT VENTRICLE PLAX 2D LVIDd:         5.10 cm  Diastology LVIDs:         4.50 cm  LV e' medial:    4.46 cm/s LV PW:         1.30 cm  LV E/e' medial:  14.0 LV IVS:        1.30 cm  LV e' lateral:   4.24 cm/s LVOT diam:     2.00 cm  LV E/e' lateral: 14.7 LV SV:         60 LV SV Index:   34 LVOT Area:     3.14 cm  RIGHT VENTRICLE RV Basal diam:  2.70 cm RV S prime:     10.40 cm/s TAPSE (M-mode): 2.1 cm LEFT ATRIUM             Index       RIGHT ATRIUM           Index LA diam:        3.30 cm 1.85 cm/m  RA Area:     13.80 cm LA Vol (A2C):   35.5 ml 19.86 ml/m RA Volume:   37.60 ml  21.03 ml/m LA Vol (A4C):   29.6 ml 16.56 ml/m LA Biplane Vol: 35.7 ml 19.97 ml/m  AORTIC VALVE LVOT Vmax:   101.00 cm/s LVOT Vmean:  55.900 cm/s LVOT VTI:    0.192 m  AORTA Ao Root diam: 2.90 cm MITRAL VALVE               TRICUSPID VALVE MV Area (PHT): 3.08 cm    TR Peak grad:   28.5 mmHg MV Decel Time: 246 msec    TR Vmax:        267.00 cm/s MV E velocity: 62.40 cm/s MV A velocity: 73.30 cm/s  SHUNTS MV E/A ratio:  0.85        Systemic VTI:  0.19 m                            Systemic Diam: 2.00 cm Fransico Him MD Electronically signed by Fransico Him  MD Signature Date/Time: 10/11/2020/3:22:47 PM    Final      Medical Consultants:   None.   Subjective:    [name] no complaints  Objective:    Vitals:   10/13/20 0600 10/13/20 0726 10/13/20 0738 10/13/20 0800  BP: 138/72   131/63  Pulse: 64   62  Resp: (!) 23   (!) 24  Temp:  97.8 F (36.6 C)    TempSrc:  Oral    SpO2: 95%  96% 96%  Weight:      Height:       SpO2: 96 % O2 Flow Rate (L/min): 2 L/min   Intake/Output Summary (Last 24 hours) at 10/13/2020 0957 Last data filed at 10/13/2020 0600 Gross per 24 hour  Intake 796.36 ml  Output 250 ml  Net 546.36 ml    Filed Weights   10/10/20 2012 10/12/20 0500 10/13/20 0458  Weight: 63 kg 62.7 kg 62.2 kg    Exam: General exam:  In no acute distress. Respiratory system: Good air movement and clear to auscultation. Cardiovascular system: S1 & S2 heard, RRR. No JVD. Gastrointestinal system: Abdomen is nondistended, soft and nontender.  Extremities: No pedal edema. Skin: No rashes, lesions or ulcers  Data Reviewed:    Labs: Basic Metabolic Panel: Recent Labs  Lab 10/10/20 2023 10/11/20 1101 10/12/20 0537 10/13/20 0223  NA 137 134* 135 134*  K 4.3 4.1 3.7 4.2  CL 103 102 104 101  CO2 '26 25 24 22  '$ GLUCOSE 104* 101* 114* 107*  BUN '13 13 9 8  '$ CREATININE 1.19 1.04 0.98 0.95  CALCIUM 9.3 9.1 8.7* 8.3*  MG  --  1.9 1.9  --     GFR Estimated Creatinine Clearance: 57.3 mL/min (by C-G formula based on SCr of 0.95 mg/dL). Liver Function Tests: Recent Labs  Lab 10/10/20 2023 10/11/20 1101  AST 58* 49*  ALT 77* 64*  ALKPHOS 70 61  BILITOT 0.7 0.5  PROT 7.1 6.7  ALBUMIN 3.4* 3.1*    Recent Labs  Lab 10/10/20 2023  LIPASE 33    No results for input(s): AMMONIA in the last 168 hours. Coagulation profile No results for input(s): INR, PROTIME in the last 168 hours. COVID-19 Labs  No results for input(s): DDIMER, FERRITIN, LDH, CRP in the last 72 hours.  Lab Results  Component Value Date    SARSCOV2NAA NEGATIVE 10/10/2020   SARSCOV2NAA NEGATIVE 06/01/2020   [place] NEGATIVE 04/22/2019   [place] Not Detected 02/04/2019    CBC: Recent Labs  Lab 10/10/20 2023 10/11/20 1101 10/12/20 0537 10/13/20 0223  WBC 11.7* 9.5 13.2* 12.2*  NEUTROABS 8.5*  --   --   --   HGB 12.9* 13.6 13.9 12.9*  HCT 39.5 39.6 40.1 37.9*  MCV 96.3 93.2 91.3 91.8  PLT 260 232 204 192    Cardiac Enzymes: No results for input(s): CKTOTAL, CKMB, CKMBINDEX, TROPONINI in the last 168 hours. BNP (last 3 results) Recent Labs    07/12/20 1017  PROBNP 309    CBG: Recent Labs  Lab 10/11/20 2008  GLUCAP 124*    D-Dimer: No results for input(s): DDIMER in the last 72 hours. Hgb A1c: No results for input(s): HGBA1C in the last 72 hours. Lipid Profile: No results for input(s): CHOL, HDL, LDLCALC, TRIG, CHOLHDL, LDLDIRECT in the last 72 hours. Thyroid function studies: Recent Labs    10/11/20 1101  TSH 0.950    Anemia work up: No results for input(s): VITAMINB12, FOLATE, FERRITIN, TIBC, IRON, RETICCTPCT in the last 72 hours. Sepsis Labs: Recent Labs  Lab 10/10/20 2023 10/11/20 1101 10/12/20 0537 10/13/20 0223  WBC 11.7* 9.5 13.2* 12.2*    Microbiology Recent Results (from the past 240 hour(s))  Resp Panel by RT-PCR (Flu A&B, Covid) Nasopharyngeal Swab     Status: None   Collection Time: 10/10/20  8:56 PM   Specimen: Nasopharyngeal Swab; Nasopharyngeal(NP) swabs in vial transport medium  Result Value Ref Range Status   SARS Coronavirus 2 by RT PCR NEGATIVE NEGATIVE Final    Comment: (NOTE) SARS-CoV-2 target nucleic acids are NOT DETECTED.  The SARS-CoV-2 RNA is generally detectable in upper respiratory specimens during the acute phase of infection. The lowest concentration of SARS-CoV-2 viral copies this assay can detect is 138 copies/mL. A negative result does not preclude SARS-Cov-2 infection and should not be used as the sole basis for treatment or other patient  management decisions. A negative result may occur with  improper specimen collection/handling, submission of  specimen other than nasopharyngeal swab, presence of viral mutation(s) within the areas targeted by this assay, and inadequate number of viral copies(<138 copies/mL). A negative result must be combined with clinical observations, patient history, and epidemiological information. The expected result is Negative.  Fact Sheet for Patients:  EntrepreneurPulse.com.au  Fact Sheet for Healthcare Providers:  IncredibleEmployment.be  This test is no t yet approved or cleared by the [place] FDA and  has been authorized for detection and/or diagnosis of SARS-CoV-2 by FDA under an Emergency Use Authorization (EUA). This EUA will remain  in effect (meaning this test can be used) for the duration of the COVID-19 declaration under Section 564(b)(1) of the Act, 21 U.S.C.section 360bbb-3(b)(1), unless the authorization is terminated  or revoked sooner.       Influenza A by PCR NEGATIVE NEGATIVE Final   Influenza B by PCR NEGATIVE NEGATIVE Final    Comment: (NOTE) The Xpert Xpress SARS-CoV-2/FLU/RSV plus assay is intended as an aid in the diagnosis of influenza from Nasopharyngeal swab specimens and should not be used as a sole basis for treatment. Nasal washings and aspirates are unacceptable for Xpert Xpress SARS-CoV-2/FLU/RSV testing.  Fact Sheet for Patients: EntrepreneurPulse.com.au  Fact Sheet for Healthcare Providers: IncredibleEmployment.be  This test is not yet approved or cleared by the [place] FDA and has been authorized for detection and/or diagnosis of SARS-CoV-2 by FDA under an Emergency Use Authorization (EUA). This EUA will remain in effect (meaning this test can be used) for the duration of the COVID-19 declaration under Section 564(b)(1) of the Act, 21 U.S.C. section 360bbb-3(b)(1),  unless the authorization is terminated or revoked.  Performed at [facility], [place] [address] [place], [place] [address]   MRSA Next Gen by PCR, Nasal     Status: None   Collection Time: 10/11/20  8:31 PM   Specimen: Nasal Mucosa; Nasal Swab  Result Value Ref Range Status   MRSA by PCR Next Gen NOT DETECTED NOT DETECTED Final    Comment: (NOTE) The GeneXpert MRSA Assay (FDA approved for NASAL specimens only), is one component of a comprehensive MRSA colonization surveillance program. It is not intended to diagnose MRSA infection nor to guide or monitor treatment for MRSA infections. Test performance is not FDA approved in patients less than 53 years old. Performed at [facility], [place] [address] [place], [place] [address]      Medications:    amiodarone  400 mg Oral BID   aspirin  81 mg Oral Daily   atorvastatin  80 mg Oral QPM   carvedilol  6.25 mg Oral BID WC   Chlorhexidine Gluconate Cloth  6 each Topical Daily   empagliflozin  10 mg Oral Daily   mouth rinse  15 mL Mouth Rinse BID   mexiletine  200 mg Oral Q12H   mometasone-formoterol  2 puff Inhalation BID   omega-3 acid ethyl esters  1 g Oral Daily   pantoprazole (PROTONIX) IV  40 mg Intravenous Q12H   rivaroxaban  20 mg Oral Q supper   sacubitril-valsartan  1 tablet Oral BID   sodium chloride flush  3 mL Intravenous Q12H   spironolactone  12.5 mg Oral Daily   Continuous Infusions:  sodium chloride        LOS: 2 days   Charlynne Cousins  [facility]  10/13/2020, 9:57 AM

## 2020-10-14 ENCOUNTER — Inpatient Hospital Stay (HOSPITAL_COMMUNITY): Payer: Medicare (Managed Care)

## 2020-10-14 DIAGNOSIS — Z7189 Other specified counseling: Secondary | ICD-10-CM

## 2020-10-14 DIAGNOSIS — R1084 Generalized abdominal pain: Secondary | ICD-10-CM

## 2020-10-14 DIAGNOSIS — I472 Ventricular tachycardia: Secondary | ICD-10-CM | POA: Diagnosis not present

## 2020-10-14 LAB — CBC WITH DIFFERENTIAL/PLATELET
Abs Immature Granulocytes: 0.03 10*3/uL (ref 0.00–0.07)
Basophils Absolute: 0 10*3/uL (ref 0.0–0.1)
Basophils Relative: 0 %
Eosinophils Absolute: 0.1 10*3/uL (ref 0.0–0.5)
Eosinophils Relative: 1 %
HCT: 36.2 % — ABNORMAL LOW (ref 39.0–52.0)
Hemoglobin: 12.2 g/dL — ABNORMAL LOW (ref 13.0–17.0)
Immature Granulocytes: 0 %
Lymphocytes Relative: 14 %
Lymphs Abs: 1.2 10*3/uL (ref 0.7–4.0)
MCH: 31.5 pg (ref 26.0–34.0)
MCHC: 33.7 g/dL (ref 30.0–36.0)
MCV: 93.5 fL (ref 80.0–100.0)
Monocytes Absolute: 1.1 10*3/uL — ABNORMAL HIGH (ref 0.1–1.0)
Monocytes Relative: 14 %
Neutro Abs: 5.7 10*3/uL (ref 1.7–7.7)
Neutrophils Relative %: 71 %
Platelets: 198 10*3/uL (ref 150–400)
RBC: 3.87 MIL/uL — ABNORMAL LOW (ref 4.22–5.81)
RDW: 15.4 % (ref 11.5–15.5)
WBC: 8.1 10*3/uL (ref 4.0–10.5)
nRBC: 0 % (ref 0.0–0.2)

## 2020-10-14 LAB — BASIC METABOLIC PANEL
Anion gap: 7 (ref 5–15)
BUN: 7 mg/dL — ABNORMAL LOW (ref 8–23)
CO2: 25 mmol/L (ref 22–32)
Calcium: 8.6 mg/dL — ABNORMAL LOW (ref 8.9–10.3)
Chloride: 104 mmol/L (ref 98–111)
Creatinine, Ser: 1.08 mg/dL (ref 0.61–1.24)
GFR, Estimated: >60 mL/min (ref >60)
Glucose, Bld: 93 mg/dL (ref 70–99)
Potassium: 3.9 mmol/L (ref 3.5–5.1)
Sodium: 136 mmol/L (ref 135–145)

## 2020-10-14 MED ORDER — IOHEXOL 9 MG/ML PO SOLN
ORAL | Status: AC
  Administered 2020-10-14: 500 mL
  Filled 2020-10-14: qty 1000

## 2020-10-14 NOTE — Plan of Care (Signed)

## 2020-10-14 NOTE — Progress Notes (Signed)
[facility] PROGRESS NOTE    Progress Note  [name]  [id number] DOB: 1945-07-03 DOA: 10/10/2020 PCP: Merryl Cook, No     Brief Narrative:   Benjamin Cook is an 75 y.o. male past medical history of CAD status post PCI, chronic systolic heart failure status post AICD placement, paroxysmal atrial fibrillation on Xarelto comes into the ED having nausea vomiting and abdominal discomfort has been going on for the last 4 weeks in the ER he was found to be in V. tach cardiology was consulted who cardioverted the patient, CT scan of the abdomen and pelvis was unremarkable mildly elevated LFTs hemoglobin of 13 COVID test was negative was started on amiodarone bolus and infusion.  Status post left heart cath revealed 75% stenosis of left main.  Cardiology recommended given her overall condition he would not be a borderline candidate for coronary artery bypass and graft.  It was discussed with the patient he would not like to proceed with CABG.  Cardiology has elected to treat medically. Electrophysiology is also on board, recommended to continue oral amiodarone and mexiletine. Maximize antianginal and CAD therapy  Assessment/Plan:   V tach [facility]): Check a magnesium level and replete as needed. VT rate will be set low the first detection zone.  Ischemic cardiomyopathy/chronic systolic heart failure: With a 2D echo showing an EF of 30% AICD in place.  The cardiac cath that showed 75% stenosis to the left main Currently on Entresto, Jardiance, Aldactone and Coreg.  Titrate as tolerated per cardiology. Appears euvolemic on physical exam.  History of chronic normocytic anemia: No signs of oral bleeding continue to follow CBC.  Paroxysmal atrial fibrillation ([name]) Rate controlled on beta-blocker and amiodarone can be restarted on Xarelto once procedures are completed.  History of abdominal pain: Patient related that his symptoms were related to his hydralazine this was  stopped. Continue PPI, continues to have abdominal pain every time they give him all of his pills. Patient continues to have abdominal pain.  CT scan was normal he has a mild elevated white count. Check a CBC with differential repeat a CT scan of the abdomen and pelvis to rule out acute diverticulitis, going back to her history has had a history of acute diverticulitis.  DVT prophylaxis: Lovenox Family Communication: Patient's wife Status is: Inpatient  Remains inpatient appropriate because:Hemodynamically unstable  Dispo: The patient is from: Home              Anticipated d/c is to: SNF              Patient currently is not medically stable to d/c.   Difficult to place patient No        Code Status:     Code Status Orders  (From admission, onward)           Start     Ordered   10/11/20 0204  Full code  Continuous        10/11/20 0204           Code Status History     Date Active Date Inactive Code Status Order ID Comments User Context   06/01/2020 1746 06/04/2020 1900 Full Code [id number]  Baldwin Jamaica, PA-C ED   04/22/2019 0348 04/22/2019 2354 Full Code [id number]  Clois Dupes, MD ED   03/03/2017 1329 03/05/2017 1321 Full Code [id number]  Gabriel Earing, PA-C Inpatient   11/24/2016 1549 11/30/2016 1643 Full Code [id number]  Rondel Jumbo, PA-C ED   11/15/2016 1542  11/18/2016 1923 Full Code [id number]  Roxan Hockey, MD ED   07/16/2016 1951 07/18/2016 1431 Full Code [id number]  Leanor Kail, [place] Inpatient   03/28/2015 1643 03/30/2015 1525 Full Code [id number]  Waldemar Dickens, MD ED   05/27/2014 1514 05/27/2014 2037 Full Code [id number]  Sherren Mocha, MD Inpatient   03/18/2014 1132 03/18/2014 1629 Full Code [id number]  Thompson Grayer, MD Inpatient         IV Access:   Peripheral IV   Procedures and diagnostic studies:   No results found.   Medical Consultants:   None.   Subjective:    Benjamin Cook  no complains  Objective:     Vitals:   10/13/20 2036 10/13/20 2100 10/14/20 0653 10/14/20 0837  BP:  130/60 122/68   Pulse:  64 62   Resp:  16 16   Temp:  98.6 F (37 C) 98.4 F (36.9 C)   TempSrc:  Oral Oral   SpO2: 96% 96% 97% 92%  Weight:      Height:       SpO2: 92 % O2 Flow Rate (L/min): 2 L/min FiO2 (%): 21 %   Intake/Output Summary (Last 24 hours) at 10/14/2020 0921 Last data filed at 10/13/2020 1556 Gross per 24 hour  Intake 440 ml  Output --  Net 440 ml    Filed Weights   10/10/20 2012 10/12/20 0500 10/13/20 0458  Weight: 63 kg 62.7 kg 62.2 kg    Exam: General exam: In no acute distress. Respiratory system: Good air movement and clear to auscultation. Cardiovascular system: S1 & S2 heard, RRR. No JVD. Gastrointestinal system: Abdomen is nondistended, soft and nontender.  Extremities: No pedal edema. Skin: No rashes, lesions or ulcers  Data Reviewed:    Labs: Basic Metabolic Panel: Recent Labs  Lab 10/10/20 2023 10/11/20 1101 10/12/20 0537 10/13/20 0223 10/14/20 0217  NA 137 134* 135 134* 136  K 4.3 4.1 3.7 4.2 3.9  CL 103 102 104 101 104  CO2 '26 25 24 22 25  '$ GLUCOSE 104* 101* 114* 107* 93  BUN '13 13 9 8 '$ 7*  CREATININE 1.19 1.04 0.98 0.95 1.08  CALCIUM 9.3 9.1 8.7* 8.3* 8.6*  MG  --  1.9 1.9  --   --     GFR Estimated Creatinine Clearance: 50.4 mL/min (by C-G formula based on SCr of 1.08 mg/dL). Liver Function Tests: Recent Labs  Lab 10/10/20 2023 10/11/20 1101  AST 58* 49*  ALT 77* 64*  ALKPHOS 70 61  BILITOT 0.7 0.5  PROT 7.1 6.7  ALBUMIN 3.4* 3.1*    Recent Labs  Lab 10/10/20 2023  LIPASE 33    No results for input(s): AMMONIA in the last 168 hours. Coagulation profile No results for input(s): INR, PROTIME in the last 168 hours. COVID-19 Labs  No results for input(s): DDIMER, FERRITIN, LDH, CRP in the last 72 hours.  Lab Results  Component Value Date   SARSCOV2NAA NEGATIVE 10/10/2020   SARSCOV2NAA NEGATIVE 06/01/2020   [place]  NEGATIVE 04/22/2019   [place] Not Detected 02/04/2019    CBC: Recent Labs  Lab 10/10/20 2023 10/11/20 1101 10/12/20 0537 10/13/20 0223  WBC 11.7* 9.5 13.2* 12.2*  NEUTROABS 8.5*  --   --   --   HGB 12.9* 13.6 13.9 12.9*  HCT 39.5 39.6 40.1 37.9*  MCV 96.3 93.2 91.3 91.8  PLT 260 232 204 192    Cardiac Enzymes: No results for input(s): CKTOTAL, CKMB, CKMBINDEX, TROPONINI in the last 168  hours. BNP (last 3 results) Recent Labs    07/12/20 1017  PROBNP 309    CBG: Recent Labs  Lab 10/11/20 2008  GLUCAP 124*    D-Dimer: No results for input(s): DDIMER in the last 72 hours. Hgb A1c: No results for input(s): HGBA1C in the last 72 hours. Lipid Profile: No results for input(s): CHOL, HDL, LDLCALC, TRIG, CHOLHDL, LDLDIRECT in the last 72 hours. Thyroid function studies: Recent Labs    10/11/20 1101  TSH 0.950    Anemia work up: No results for input(s): VITAMINB12, FOLATE, FERRITIN, TIBC, IRON, RETICCTPCT in the last 72 hours. Sepsis Labs: Recent Labs  Lab 10/10/20 2023 10/11/20 1101 10/12/20 0537 10/13/20 0223  WBC 11.7* 9.5 13.2* 12.2*    Microbiology Recent Results (from the past 240 hour(s))  Resp Panel by RT-PCR (Flu A&B, Covid) Nasopharyngeal Swab     Status: None   Collection Time: 10/10/20  8:56 PM   Specimen: Nasopharyngeal Swab; Nasopharyngeal(NP) swabs in vial transport medium  Result Value Ref Range Status   SARS Coronavirus 2 by RT PCR NEGATIVE NEGATIVE Final    Comment: (NOTE) SARS-CoV-2 target nucleic acids are NOT DETECTED.  The SARS-CoV-2 RNA is generally detectable in upper respiratory specimens during the acute phase of infection. The lowest concentration of SARS-CoV-2 viral copies this assay can detect is 138 copies/mL. A negative result does not preclude SARS-Cov-2 infection and should not be used as the sole basis for treatment or other patient management decisions. A negative result may occur with  improper specimen  collection/handling, submission of specimen other than nasopharyngeal swab, presence of viral mutation(s) within the areas targeted by this assay, and inadequate number of viral copies(<138 copies/mL). A negative result must be combined with clinical observations, patient history, and epidemiological information. The expected result is Negative.  Fact Sheet for Patients:  EntrepreneurPulse.com.au  Fact Sheet for Healthcare Providers:  IncredibleEmployment.be  This test is no t yet approved or cleared by the [place] FDA and  has been authorized for detection and/or diagnosis of SARS-CoV-2 by FDA under an Emergency Use Authorization (EUA). This EUA will remain  in effect (meaning this test can be used) for the duration of the COVID-19 declaration under Section 564(b)(1) of the Act, 21 U.S.C.section 360bbb-3(b)(1), unless the authorization is terminated  or revoked sooner.       Influenza A by PCR NEGATIVE NEGATIVE Final   Influenza B by PCR NEGATIVE NEGATIVE Final    Comment: (NOTE) The Xpert Xpress SARS-CoV-2/FLU/RSV plus assay is intended as an aid in the diagnosis of influenza from Nasopharyngeal swab specimens and should not be used as a sole basis for treatment. Nasal washings and aspirates are unacceptable for Xpert Xpress SARS-CoV-2/FLU/RSV testing.  Fact Sheet for Patients: EntrepreneurPulse.com.au  Fact Sheet for Healthcare Providers: IncredibleEmployment.be  This test is not yet approved or cleared by the [place] FDA and has been authorized for detection and/or diagnosis of SARS-CoV-2 by FDA under an Emergency Use Authorization (EUA). This EUA will remain in effect (meaning this test can be used) for the duration of the COVID-19 declaration under Section 564(b)(1) of the Act, 21 U.S.C. section 360bbb-3(b)(1), unless the authorization is terminated or revoked.  Performed at [facility], [place] [address] [place], [place] [address]   MRSA Next Gen by PCR, Nasal     Status: None   Collection Time: 10/11/20  8:31 PM   Specimen: Nasal Mucosa; Nasal Swab  Result Value Ref Range Status   MRSA  by PCR Next Gen NOT DETECTED NOT DETECTED Final    Comment: (NOTE) The GeneXpert MRSA Assay (FDA approved for NASAL specimens only), is one component of a comprehensive MRSA colonization surveillance program. It is not intended to diagnose MRSA infection nor to guide or monitor treatment for MRSA infections. Test performance is not FDA approved in patients less than 56 years old. Performed at [facility], [place] [address], [place], [place] [address]      Medications:    amiodarone  400 mg Oral BID   aspirin  81 mg Oral Daily   atorvastatin  80 mg Oral QPM   carvedilol  6.25 mg Oral BID WC   Chlorhexidine Gluconate Cloth  6 each Topical Daily   empagliflozin  10 mg Oral Daily   mouth rinse  15 mL Mouth Rinse BID   mexiletine  200 mg Oral Q12H   mometasone-formoterol  2 puff Inhalation BID   omega-3 acid ethyl esters  1 g Oral Daily   pantoprazole (PROTONIX) IV  40 mg Intravenous Q12H   rivaroxaban  20 mg Oral Q supper   sacubitril-valsartan  1 tablet Oral BID   sodium chloride flush  3 mL Intravenous Q12H   spironolactone  12.5 mg Oral Daily   Continuous Infusions:  sodium chloride        LOS: 3 days   Charlynne Cousins  [facility]  10/14/2020, 9:21 AM

## 2020-10-14 NOTE — Progress Notes (Signed)
PT Cancellation Note  Patient Details Name: Benjamin Cook MRN: [id number] DOB: February 01, 1946   Cancelled Treatment:    Reason Eval/Treat Not Completed: Medical issues which prohibited therapy - pt in and out of vtach today, RN requesting hold PT. Will check back at a later date.   Stacie Glaze, PT DPT Acute Rehabilitation Services Pager [phone]  Office [phone]    [place] 10/14/2020, 12:12 PM

## 2020-10-14 NOTE — Progress Notes (Signed)
Patient was sustaining [place] aroudn 10:50 I was notified by telemetry, when I entered the room the patient was feeling dizzy but was alert and oriented, BP WNL, O2 95%. Defibrillator pads applied and patient performed vagal maneuver. About 30 seconds after this was done patient converted to NSR.   MD [name] was paged and returned call around 11:05 via phone, instructed to keep monitoring clsoely at this time and that he would adjust the patient's medications.

## 2020-10-14 NOTE — Consult Note (Signed)
Consultation Note Date: 10/14/2020   Patient Name: Benjamin Cook  DOB: April 10, 1945  MRN: [id number]  Age / Sex: 75 y.o., male  PCP: Pcp, No Referring Physician: Aileen Fass, Tammi Klippel, MD  Reason for Consultation: Establishing goals of care  HPI/Patient Profile: 75 y.o. male  with past medical history of CAD status post PCI, chronic systolic CHF status post ICD placement, paroxysmal atrial fibrillation, hypertension admitted on 10/10/2020 with abdominal discomfort and nausea vomiting with poor oral intake.   In the ER, he was found to be in V. Tach and left heart cath revealed 75% stenosis of left main. Patient declined CABG, echo showed EF of 30%. Palliative care has been consulted to assist with goals of care conversation.  Clinical Assessment and Goals of Care:  I have reviewed medical records including EPIC notes, labs and imaging, received report from RN, assessed the patient and then met at the bedside along with his wife Benjamin Cook to discuss diagnosis prognosis, GOC, EOL wishes, disposition and options.  I introduced Palliative Medicine as specialized medical care for people living with serious illness. It focuses on providing relief from the symptoms and stress of a serious illness. The goal is to improve quality of life for both the patient and the family.  We discussed a brief life review of the patient and then focused on their current illness. The natural disease trajectory and expectations at EOL were discussed. Benjamin Cook and Benjamin Cook share a daughter and two sons. 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 Since retiring at age 1, he has continued to work in Administrator, arts care for several clients, as recently as last week. They enjoy going to church on Sundays and cooking together for their community. Keric shares that he has accomplished many of his goals and lived a blessed life. 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 He is independent with all ADLs, although during the past week he has felt weaker and reports more difficulty due to abdominal pain. He does not use assistive device for ambulation. He has never had a large appetite, but this has worsened recently as well. He feels that he has been able to enjoy his quality of life for the past 25 years with heart disease, however his abdominal pain has now become more troublesome. Discussed patient's previous experiences with receiving ICD shocks and the pain that he is now "terrified" of feeling again. Emphasized the importance of expressing when he is no longer willing to tolerate the risk of suffering and would prefer to focus on his comfort, peace, and dignity.   I attempted to elicit values and goals of care important to the patient.   Patient's values include his faith, family, and independence. He would prefer to discharge home when stable to receive [place] therapies and support. He understands the importance of following PT recommendations and would possibly consider a short-term rehab stay.  The difference  between aggressive medical intervention and comfort care was considered in light of the patient's goals of care.   Advanced directives, concepts specific to code status, artifical feeding and hydration, and rehospitalization were considered and discussed.  Hospice and Palliative Care services outpatient were explained and offered.  Discussed the importance of continued conversation with family and the medical providers regarding overall plan of care and treatment options, ensuring decisions are within the context of the patient's values and GOCs.    Questions and concerns were addressed.  Hard Choices booklet left for review. The family was encouraged to call with questions or concerns.  PMT will continue to support  holistically.   PATIENT is the primary decision maker. Next of kin is his wife Benjamin Cook. No HCPOA on file.     SUMMARY OF RECOMMENDATIONS   -Full code/full scope treatment -Patient does not feel ready to discharge until his abdominal pain is further evaluated. He would prefer to go home with HH/outpatient palliative care if stable and pain improves -Psychosocial and emotional support provided -PMT will f/u Monday 9/12, please reach out to with any acute needs  Code Status/Advance Care Planning: Full code  Palliative Prophylaxis:  Bowel Regimen and Frequent Pain Assessment  Additional Recommendations (Limitations, Scope, Preferences): Full Scope Treatment  Psycho-social/Spiritual:  Desire for further Chaplaincy support:tbd Additional Recommendations: Caregiving  Support/Resources, Formal Capacity Evaluation, and Referral to Community Resources   Prognosis:  Unable to determine  Discharge Planning: To Be Determined      Primary Diagnoses: Present on Admission:  Abdominal pain  CAD (coronary artery disease)  Cardiomyopathy, ischemic  Aneurysm of abdominal vessel (5.6 CM)  HFrEF (heart failure with reduced ejection fraction) ([place])  ICD (implantable cardioverter-defibrillator) in place  Essential hypertension  Paroxysmal atrial fibrillation ([place])   I have reviewed the medical record, interviewed the patient and family, and examined the patient. The following aspects are pertinent.  Past Medical History:  Diagnosis Date   Adrenal mass ([place])    per pt this is remote (10 years) and benign by biopsy   AICD (automatic cardioverter/defibrillator) present    Anxiety    Arthritis    CAD (coronary artery disease)    a. s/p PCI to LAD 2001 b. myoview 2014 high risk with scar LAD/RCA territory but no ischemia   Cardiomyopathy, ischemic    CHF (congestive heart failure) ([name])    class II/III   COPD (chronic obstructive pulmonary disease) ([place])    smokes cigars but has quit  cigarettes   Defibrillator discharge 04/21/2019   Diverticulitis    Dyspnea    Flu 03/2015   HTN (hypertension)    Hyperlipidemia    NSVT (nonsustained ventricular tachycardia) ([place]) 03/05/2017   Paroxysmal atrial fibrillation ([place]) 03/2015   chads2vasc score is 4   PSA (psoriatic arthritis) ([name])    increased   PVD (peripheral vascular disease) ([place])    Social History   Socioeconomic History   Marital status: Married    Spouse name: Not on file   Number of children: Not on file   Years of education: Not on file   Highest education level: Not on file  Occupational History   Occupation: Lawn care  Tobacco Use   Smoking status: Every Day    Types: Cigars   Smokeless tobacco: Never   Tobacco comments:    smokes cigars now, no longer smokes cigarettes but previously smoked 2ppd  Vaping Use   Vaping Use: Never used  Substance and Sexual Activity   Alcohol  use: No   Drug use: No   Sexual activity: Not Currently    Partners: Female    Birth control/protection: None  Other Topics Concern   Not on file  Social History Narrative   Not on file   Social Determinants of Health   Financial Resource Strain: Not on file  Food Insecurity: Not on file  Transportation Needs: Not on file  Physical Activity: Not on file  Stress: Not on file  Social Connections: Not on file   Family History  Problem Relation Age of Onset   Cirrhosis Mother        due to ETOH   Cancer Neg Hx    Scheduled Meds:  amiodarone  400 mg Oral BID   atorvastatin  80 mg Oral QPM   carvedilol  6.25 mg Oral BID WC   Chlorhexidine Gluconate Cloth  6 each Topical Daily   empagliflozin  10 mg Oral Daily   iohexol       mouth rinse  15 mL Mouth Rinse BID   mexiletine  200 mg Oral Q12H   mometasone-formoterol  2 puff Inhalation BID   omega-3 acid ethyl esters  1 g Oral Daily   pantoprazole (PROTONIX) IV  40 mg Intravenous Q12H   rivaroxaban  20 mg Oral Q supper   sacubitril-valsartan  1 tablet Oral BID    sodium chloride flush  3 mL Intravenous Q12H   spironolactone  12.5 mg Oral Daily   Continuous Infusions:  sodium chloride     PRN Meds:.sodium chloride, acetaminophen, feeding supplement, morphine injection, nitroGLYCERIN, ondansetron (ZOFRAN) IV, sodium chloride flush Medications Prior to Admission:  Prior to Admission medications   Medication Sig Start Date End Date Taking? Authorizing Provider  acetaminophen (TYLENOL) 500 MG tablet Take 500-1,000 mg by mouth every 8 (eight) hours as needed (for pain).    Yes [provider]  amiodarone (PACERONE) 200 MG tablet Take 1 tablet (200 mg total) by mouth daily. 07/12/20  Yes Allred, Jeneen Rinks, MD  atorvastatin (LIPITOR) 80 MG tablet TAKE 1 TABLET BY MOUTH  DAILY AT 6 PM Patient taking differently: Take 80 mg by mouth every evening. 04/19/20  Yes Lelon Perla, MD  bismuth subsalicylate (PEPTO BISMOL) 262 MG/15ML suspension Take 30 mLs by mouth every 6 (six) hours as needed for indigestion or diarrhea or loose stools.   Yes [provider]  carvedilol (COREG) 25 MG tablet Take 1 tablet (25 mg total) by mouth 2 (two) times daily with a meal. 04/19/20  Yes Allred, Jeneen Rinks, MD  Famotidine (PEPCID AC PO) Take 1 tablet by mouth 2 (two) times daily as needed (acid reflux).   Yes [provider]  feeding supplement (ENSURE ENLIVE / ENSURE PLUS) LIQD Try to drink 1 can 1-2 times a day. Patient taking differently: Take 237 mLs by mouth in the morning and at bedtime. strawberry 06/04/20  Yes Weaver, Scott T, PA-C  Fluticasone-Salmeterol (ADVAIR) 100-50 MCG/DOSE AEPB Inhale 1 puff into the lungs 2 (two) times daily. 01/07/20  Yes Shirley Friar, PA-C  furosemide (LASIX) 40 MG tablet Take 0.5 tablets (20 mg total) by mouth daily. 04/19/20  Yes Allred, Jeneen Rinks, MD  hydrALAZINE (APRESOLINE) 25 MG tablet Take 1 tablet (25 mg total) by mouth 3 (three) times daily. 11/17/19 10/11/20 Yes Tillery, Satira Mccallum, PA-C  isosorbide mononitrate  (IMDUR) 30 MG 24 hr tablet Take 0.5 tablets (15 mg total) by mouth daily. 11/17/19  Yes Shirley Friar, PA-C  Multiple Vitamin (MULTIVITAMIN) capsule  Take 1 capsule by mouth daily.   Yes [provider]  nitroGLYCERIN (NITROSTAT) 0.4 MG SL tablet Place 1 tablet (0.4 mg total) under the tongue every 5 (five) minutes x 3 doses as needed for chest pain. 06/04/20 06/04/21 Yes Weaver, Scott T, PA-C  Omega-3 Fatty Acids (FISH OIL) 1200 MG CAPS Take 1,200 mg by mouth daily.   Yes [provider]  potassium chloride SA (KLOR-CON) 20 MEQ tablet TAKE 1 TABLET BY MOUTH  DAILY Patient taking differently: Take 20 mEq by mouth daily. 02/24/20  Yes Lelon Perla, MD  Pseudoeph-Doxylamine-DM-APAP (NYQUIL PO) Take 1 Dose by mouth at bedtime as needed (sleep). Heart healty nyquil   Yes [provider]  rivaroxaban (XARELTO) 20 MG TABS tablet Take 1 tablet (20 mg total) by mouth daily with supper. Patient taking differently: Take 20 mg by mouth at bedtime. 03/31/20  Yes Baldwin Jamaica, PA-C  sacubitril-valsartan (ENTRESTO) 49-51 MG Take 1 tablet by mouth 2 (two) times daily. 03/31/20  Yes Baldwin Jamaica, PA-C  spironolactone (ALDACTONE) 25 MG tablet Take 1 tablet (25 mg total) by mouth at bedtime. 01/07/20  Yes Shirley Friar, PA-C  mometasone-formoterol (DULERA) 100-5 MCG/ACT AERO Inhale 2 puffs into the lungs in the morning and at bedtime. Patient not taking: Reported on 10/11/2020 07/12/20   Thompson Grayer, MD   No Known Allergies Review of Systems  Gastrointestinal:  Positive for abdominal pain.  All other systems reviewed and are negative.  Physical Exam Vitals and nursing note reviewed.  Constitutional:      General: He is not in acute distress.    Interventions: Nasal cannula in place.  Cardiovascular:     Rate and Rhythm: Normal rate.  Pulmonary:     Effort: Pulmonary effort is normal.  Skin:    General: Skin is warm and dry.  Neurological:     Mental  Status: He is alert and oriented to person, place, and time.  Psychiatric:        Behavior: Behavior is cooperative.    Vital Signs: BP (!) 163/71 (BP Location: Left Arm)   Pulse 66   Temp 98.4 F (36.9 C) (Oral)   Resp 16   Ht _0  (1.778 m)   Wt 62.2 kg   SpO2 97%   BMI 19.68 kg/m  Pain Scale: 0-10 POSS *See Group Information*: 1-Acceptable,Awake and alert Pain Score: 0-No pain   SpO2: SpO2: 97 % O2 Device:SpO2: 97 % O2 Flow Rate: .O2 Flow Rate (L/min): 2 L/min  IO: Intake/output summary:  Intake/Output Summary (Last 24 hours) at 10/14/2020 1244 Last data filed at 10/14/2020 1100 Gross per 24 hour  Intake 680 ml  Output --  Net 680 ml    LBM: Last BM Date: 10/13/20 Baseline Weight: Weight: 63 kg Most recent weight: Weight: 62.2 kg     Palliative Assessment/Data:     Time In: 11:45am Time Out: 1:00pm Time Total: 75 minutes Greater than 50% of this time was spent in counseling and coordinating care related to the above assessment and plan.  Dorthy Cooler, PA-C Palliative Medicine Team Team phone # [phone]  Thank you for allowing the Palliative Medicine Team to assist in the care of this patient. Please utilize secure chat with additional questions, if there is no response within 30 minutes please call the above phone number.  Palliative Medicine Team providers are available by phone from 7am to 7pm daily and can be reached through the team cell phone.  Should this  patient require assistance outside of these hours, please call the patient's attending physician.

## 2020-10-14 NOTE — Progress Notes (Signed)
Date and time results received: 10/13/20 2000 Test: troponin Critical Value: 292 Name of Provider Notified: B. Chotiner and Dr. Humphrey Rolls Orders Received? Or Actions Taken?:  per protocol , no new orders.

## 2020-10-14 NOTE — Progress Notes (Addendum)
Progress Note  Patient Name: Benjamin Cook Date of Encounter: 10/14/2020  [facility] Cardiologist: Kirk Ruths, MD   Subjective   It happened again last pm. After he took his pm meds, he had severe hot flash and lower abd pain. The pain was not as bad as at home, but he felt terrible.  Sx gradually eased up, but he still does not feel well. Not on O2 at home  Inpatient Medications    Scheduled Meds:  amiodarone  400 mg Oral BID   aspirin  81 mg Oral Daily   atorvastatin  80 mg Oral QPM   carvedilol  6.25 mg Oral BID WC   Chlorhexidine Gluconate Cloth  6 each Topical Daily   empagliflozin  10 mg Oral Daily   mouth rinse  15 mL Mouth Rinse BID   mexiletine  200 mg Oral Q12H   mometasone-formoterol  2 puff Inhalation BID   omega-3 acid ethyl esters  1 g Oral Daily   pantoprazole (PROTONIX) IV  40 mg Intravenous Q12H   rivaroxaban  20 mg Oral Q supper   sacubitril-valsartan  1 tablet Oral BID   sodium chloride flush  3 mL Intravenous Q12H   spironolactone  12.5 mg Oral Daily   Continuous Infusions:  sodium chloride     PRN Meds: sodium chloride, acetaminophen, feeding supplement, morphine injection, nitroGLYCERIN, ondansetron (ZOFRAN) IV, sodium chloride flush   Vital Signs    Vitals:   10/13/20 1858 10/13/20 2036 10/13/20 2100 10/14/20 0653  BP: 111/62  130/60 122/68  Pulse: 62  64 62  Resp:   16 16  Temp:   98.6 F (37 C) 98.4 F (36.9 C)  TempSrc:   Oral Oral  SpO2:  96% 96% 97%  Weight:      Height:        Intake/Output Summary (Last 24 hours) at 10/14/2020 0749 Last data filed at 10/13/2020 1556 Gross per 24 hour  Intake 640 ml  Output --  Net 640 ml   Last 3 Weights 10/13/2020 10/12/2020 10/10/2020  Weight (lbs) 137 lb 2 oz 138 lb 3.7 oz 138 lb 14.2 oz  Weight (kg) 62.2 kg 62.7 kg 63 kg      Telemetry    SR, occ A pacing, ?V pacing but has artifact, rare PVCs - Personally Reviewed  Physical Exam   General: Well developed, frail, elderly,  male in no acute distress Head: Eyes PERRLA, Head normocephalic and atraumatic Lungs: few basilar rales bilaterally to auscultation. Heart: HRRR S1 S2, without rub or gallop. No murmur. 4/4 extremity pulses are 2+ & equal. No JVD. Radial cath site w/out ecchymosis or hematoma Abdomen: Bowel sounds are present, abdomen soft and non-tender without masses or  hernias noted. Msk: weak strength and tone for age. Extremities: No clubbing, cyanosis or edema.    Skin:  No rashes or lesions noted. Neuro: Alert and oriented X 3. Psych:  Good affect, responds appropriately  Labs    Chemistry Recent Labs  Lab 10/10/20 2023 10/11/20 1101 10/12/20 0537 10/13/20 0223 10/14/20 0217  NA 137 134* 135 134* 136  K 4.3 4.1 3.7 4.2 3.9  CL 103 102 104 101 104  CO2 '26 25 24 22 25  '$ GLUCOSE 104* 101* 114* 107* 93  BUN '13 13 9 8 '$ 7*  CREATININE 1.19 1.04 0.98 0.95 1.08  CALCIUM 9.3 9.1 8.7* 8.3* 8.6*  PROT 7.1 6.7  --   --   --   ALBUMIN 3.4* 3.1*  --   --   --  AST 58* 49*  --   --   --   ALT 77* 64*  --   --   --   ALKPHOS 70 61  --   --   --   BILITOT 0.7 0.5  --   --   --   GFRNONAA >60 >60 >60 >60 >60  ANIONGAP '8 7 7 11 7     '$ Hematology Recent Labs  Lab 10/11/20 1101 10/12/20 0537 10/13/20 0223  WBC 9.5 13.2* 12.2*  RBC 4.25 4.39 4.13*  HGB 13.6 13.9 12.9*  HCT 39.6 40.1 37.9*  MCV 93.2 91.3 91.8  MCH 32.0 31.7 31.2  MCHC 34.3 34.7 34.0  RDW 15.3 14.9 14.9  PLT 232 204 192    Radiology    CARDIAC CATHETERIZATION  Result Date: 10/12/2020   Mid Cx lesion is 70% stenosed.   Ost LM to Dist LM lesion is 75% stenosed.   Mid RCA lesion is 70% stenosed.   Prox RCA to Mid RCA lesion is 50% stenosed.   Previously placed Mid LAD stent (unknown type) is  widely patent. CAIDON MOULDS is a 75 y.o. male  VG:[id number] LOCATION:  FACILITY: [facility] PHYSICIAN: Quay Burow, M.D. 1945/06/03 DATE OF PROCEDURE:  10/12/2020 DATE OF DISCHARGE: CARDIAC CATHETERIZATION History obtained from chart review.75  y.o. male with past medical history of ischemic cardiomyopathy, chronic systolic congestive heart failure, prior ventricular tachycardia, prior ICD, paroxysmal atrial fibrillation, hypertension, hyperlipidemia, previous abdominal aortic aneurysm repair, COPD admitted with recurrent ventricular tachycardia.  He has a history of LAD stenting back in 2001.  He has LV dysfunction with a prior Myoview that showed inferoapical apical and anteroapical scar.  He was admitted with ventricular tachycardia.  He has a history of A. fib as well.  He was on amiodarone and lidocaine and converted to sinus rhythm.  Plans were to convert to mexiletine instead of lidocaine.  We were asked to perform coronary angiography to determine if there was an ischemic etiology. PROCEDURE DESCRIPTION: The patient was brought to the second floor [facility] cath lab in the postabsorptive state. He was premedicated with IV Versed and fentanyl. His right groinwas prepped and shaved in usual sterile fashion. Xylocaine 1% was used for local anesthesia. A 5 French sheath was inserted into the right common femoral  artery using standard Seldinger technique.  I initially try to access the right radial artery under ultrasound guidance unsuccessfully.  5 French right left Judkins diagnostic catheters were used for selective coronary angiography.  Catheter exchanges were performed over a 260 cm 035 J-wire.  Isovue dye was used for the entirety of the case (55 cc of contrast administered to the patient).  Retrograde aortic pressures monitored in the case.   Mr. Woodke has 75% calcified distal left main.  His previously placed mid LAD stent is widely patent.  He does have a 70% mid AV groove circumflex and RCA disease.  I do not think he is an operative candidate.  It is certainly possible that this is contributory to his ventricular tachycardia.  Medical therapy he will be recommended.  The femoral sheath was removed and pressure was held on the  groin to achieve hemostasis.  The patient left the lab in stable condition.  Dr. [name] was notified of these results. Quay Burow. MD, [facility] 10/12/2020 9:41 AM       Patient Profile     75 y.o. male with past medical history of ischemic cardiomyopathy, chronic systolic congestive heart failure, prior  ventricular tachycardia, prior ICD, paroxysmal atrial fibrillation, hypertension, hyperlipidemia, previous abdominal aortic aneurysm repair, COPD admitted with recurrent ventricular tachycardia.    Assessment & Plan    1 ventricular tachycardia- - now in SR +/- pacing - amio 400 mg bid day 3/14, then 400 mg qd x 14, then 200 mg qd - mexiletine is new - has ICD  2 ischemic cardiomyopathy- - entresto dose increased - EF 30-35% - Coreg 6.25 mg bid, dose limited by HR 50s at times - Jardiance added as well Arlyce Harman resumed -resume hydral/nitrates if BP will tolerate  3 chronic systolic congestive heart failure- - wt is 137, not overloaded by exam - only diuretic now is spiro - at home was on Lasix 20 mg qd - O2 sats 94-97% on 2 lpm, not on home O2 - ambulate and see how tolerated  4 history of paroxysmal atrial fibrillation-patient is in sinus rhythm.   - on BB and Xarelto  5 coronary artery disease- - on BB and statin - cath w/ multi-vessel dz, but no stents indicated - pt refused CABG eval, TCTS did not see  6. Abd pain - pt says happens after taking rx - not as severe as pta - says not constipated - discuss possible med rxn w/ Pharm  For questions or updates, please contact [facility] Please consult [email or web address] for contact info under   Signed, Rosaria Ferries, PA-C  10/14/2020, 7:49 AM   As above, patient seen and examined.  Patient denies dyspnea or chest pain.  He remains in sinus rhythm.  We will continue present medications for ischemic cardiomyopathy/CHF/ventricular tachycardia.  As outlined previously he does have left main coronary disease but he declines  coronary artery bypass and graft and would likely not be a good candidate given his age and comorbidities.  We will treat medically.  He continues to complain of lower abdominal pain particular after taking his medications.  He is somewhat tender.  Agree with GI consult.  Kirk Ruths, MD

## 2020-10-15 DIAGNOSIS — I472 Ventricular tachycardia: Secondary | ICD-10-CM | POA: Diagnosis not present

## 2020-10-15 MED ORDER — TRAZODONE HCL 50 MG PO TABS: 25.0000 mg | ORAL_TABLET | Freq: Every evening | ORAL | Status: DC | PRN

## 2020-10-15 MED ORDER — MELATONIN 5 MG PO TABS
5.0000 mg | ORAL_TABLET | Freq: Every day | ORAL | Status: DC
  Administered 2020-10-15 (×2): 5 mg via ORAL
  Filled 2020-10-15 (×2): qty 1

## 2020-10-15 NOTE — Consult Note (Addendum)
Referring Provider: Dr. Charlynne Cousins Primary Care Physician:  Pcp, No Primary Gastroenterologist:  Previously Dr. Deatra Ina   Reason for Consultation: Lower abdominal pain, N/V  HPI: Benjamin Cook is a 75 y.o. male with a past medical history significant for coronary artery disease s/p PCI, chronic systolic heart failure s/p AICD, paroxysmal atrial fibrillation on Xarelto and colon polyps.   He developed lower abdominal pain which progressively worsened over the past 3 to 6 months with intermittent nausea and vomiting for the past 2 to 3 weeks. He presented to [facility] ED 10/10/2020.  He was found to be in V. tach and he underwent successful cardioversion and he was started on IV Amiodarone, Lidocaine and he was admitted to the ICU.  CTAP 9/6 identified a stable 4.4 cm right adrenal mass likely benign adrenal adenoma or myolipoma, moderate sigmoid diverticulosis without evidence of diverticulitis, a short segment focal dissection within the suprarenal segment, endovascular repair of a abdominal aortic aneurysm measuring 5.0 cm in greatest dimension, stable, as well as 2.2 cm right common iliac artery aneurysm, unchanged.no acute intra abdominal/pelvic pathology to explain his symptoms.  His LFTs were mildly elevated and a hemoglobin level was 13.  SARS coronavirus 2 test was negative. He underwent a cardiac catheterization 9/8 which revealed 75% stenosis of the left main artery.  He was considered a borderline candidate for coronary artery bypass graft surgery and the patient did not wish to pursue heart surgery will be treated medically.  He continue to have lower abdominal pain and a repeat CTAP was done 10/14/2020 which was unremarkable, no evidence of diverticulitis or intra abdominal/pelvic infectious or inflammatory process.  A GI consult was requested for further evaluation regarding his persistent lower abdominal pain.  He reports having lower abdominal pain for the past 3 to  6 months.  His wife is at the bedside and stated he always has lower abdominal pain.  His lower abdominal pain is worse after eating and sometimes is worse after he takes his heart medications.  No specific food triggers.  He developed nausea with intermittent vomiting over the past 2 to 3 weeks.  He describes vomiting partially digested food once every few days for the past few weeks.  No coffee ground emesis or hematemesis.  His abdominal pain significantly reduces after he vomits and is completely gone within 1 hour but recurs within the next day.  He is on Xarelto.  No ASA or other NSAID.  He typically passes 2-3 soft brown bowel movements daily.  No rectal bleeding or melena.  No dysphagia or heartburn.  He often eats his meals without utilizing his dentures.  He started taking famotidine 20 mg OTC 2-3 times daily before meals for few weeks and his lower abdominal pain improved. He denies ever having an EGD.  He underwent a colonoscopy by Dr. Deatra Ina 05/14/2007 which identified a few hours adenomatous polyps removed from the colon and rectum.  He denies having any further colonoscopies since that time.  No known family history of esophageal, gastric or colorectal cancer.  His mother had alcohol-related cirrhosis.    Colonoscopy by Dr. Deatra Ina 05/14/2007: A 23m and 457mpolyp removed from the ascending colon, a 3 mm polyp removed from the cecum and a 3 mm polyp removed from the rectum and sigmoid diverticulosis was noted. Biopsy results: 1. RIGHT COLON, POLYP(S): ADENOMATOUS POLYP(S). NO HIGH GRADE   DYSPLASIA OR INVASIVE MALIGNANCY IDENTIFIED. (THREE)    2. RECTUM, POLYP: ADENOMATOUS POLYP.  NO HIGH GRADE DYSPLASIA   OR EVIDENCE OF MALIGNANCY IDENTIFIED (BIOPSY).   CTAP 10/14/2020: 1. No acute abnormality. 2. Stable right adrenal mass, previously shown to be stable since 10/14/2003, compatible with a benign process. 3. Stable marked prostatic hypertrophy. 4. Small to moderate-sized left inguinal hernia  containing fat and a short segment of a loop of distal small bowel without obstruction. 5. Colonic diverticulosis, small hiatal hernia and small pericardial effusion, without significant change.   CTAP 10/10/2020: No acute intra-abdominal pathology identified. No definite radiographic explanation for the patient's reported symptoms.  Stable left basilar bronchiectasis with superimposed mucoid impaction and volume loss.  Stable 4.4 cm right adrenal mass since remote prior examination of 10/14/2003 likely representing a benign adrenal adenoma or myelolipoma.  Moderate sigmoid diverticulosis.  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  Marked prostatic enlargement without evidence of bladder outlet obstruction.  Small bilateral inguinal hernias containing small bowel without evidence of obstruction.  Aortic aneurysm NOS. Aortic Atherosclerosis  Echo 10/11/2020: Left ventricular ejection fraction, by estimation, is 30 to 35%. The left ventricle has moderately decreased function. The left ventricle has no regional wall motion abnormalities. There is mild concentric left ventricular hypertrophy. Left ventricular diastolic parameters are consistent with Grade I diastolic dysfunction (impaired relaxation). Elevated left ventricular end-diastolic pressure. There is akinesis of the left ventricular, mid inferoseptal wall and anteroseptal wall. There is akinesis of the left ventricular, apical septal wall, inferior wall and apical segment. There is severe hypokinesis of the left ventricular, mid-apical anterior wall. 1. Right ventricular systolic function is normal. The right ventricular size is normal. There is normal pulmonary artery systolic pressure.  The estimated right ventricular systolic pressure is 0000000 mmHg. 2. The mitral valve is normal in structure. Trivial mitral valve regurgitation. No evidence of mitral stenosis. 3. The aortic valve is calcified. Aortic valve regurgitation is not visualized. Mild to moderate aortic valve sclerosis/calcification is present, without any evidence of aortic stenosis. 4. The inferior vena cava is normal in size with greater than 50% respiratory variability, suggesting right atrial pressure of 3 mmHg. 5. There is very sluggish flow in the apical LV which increases risk of apical LV thrombus formation.  Past Medical History:  Diagnosis Date   Adrenal mass ([place])    per pt this is remote (10 years) and benign by biopsy   AICD (automatic cardioverter/defibrillator) present    Anxiety    Arthritis    CAD (coronary artery disease)    a. s/p PCI to LAD 2001 b. myoview 2014 high risk with scar LAD/RCA territory but no ischemia   Cardiomyopathy, ischemic    CHF (congestive heart failure) ([name])    class II/III   COPD (chronic obstructive pulmonary disease) ([place])    smokes cigars but has quit cigarettes   Defibrillator discharge 04/21/2019   Diverticulitis    Dyspnea    Flu 03/2015   HTN (hypertension)    Hyperlipidemia    NSVT (nonsustained ventricular tachycardia) ([place]) 03/05/2017   Paroxysmal atrial fibrillation ([place]) 03/2015   chads2vasc score is 4   PSA (psoriatic arthritis) ([name])    increased   PVD (peripheral vascular disease) ([place])     Past Surgical History:  Procedure Laterality Date   ABDOMINAL AORTIC ENDOVASCULAR STENT GRAFT N/A 03/03/2017   Procedure: ABDOMINAL AORTIC  ENDOVASCULAR STENT GRAFT;  Surgeon: Waynetta Sandy, MD;  Location: [facility];  Service: Vascular;  Laterality: N/A;   cataract surgery     CORONARY ANGIOGRAPHY N/A 10/12/2020   Procedure: CORONARY ANGIOGRAPHY;  Surgeon: Lorretta Harp, MD;  Location: [place] [facility];  Service: Cardiovascular;   Laterality: N/A;   CORONARY ANGIOPLASTY WITH STENT PLACEMENT  2001   a. PCI to LAD   IMPLANTABLE CARDIOVERTER DEFIBRILLATOR (ICD) GENERATOR CHANGE N/A 03/18/2014   a. SJM Fortify ST DR ICD implanted by [facility] for primary prevention b. gen change 03/2014    LEAD REVISION/REPAIR N/A 07/17/2016   Procedure: Lead Revision/Repair;  Surgeon: Evans Lance, MD;  Location: [place] [facility];  Service: Cardiovascular;  Laterality: N/A;   LEFT HEART CATHETERIZATION WITH CORONARY ANGIOGRAM N/A 05/27/2014   Procedure: LEFT HEART CATHETERIZATION WITH CORONARY ANGIOGRAM;  Surgeon: Sherren Mocha, MD;  Location: [facility] CATH LAB;  Service: Cardiovascular;  Laterality: N/A;   REPAIR KNEE LIGAMENT      Prior to Admission medications   Medication Sig Start Date End Date Taking? Authorizing Provider  acetaminophen (TYLENOL) 500 MG tablet Take 500-1,000 mg by mouth every 8 (eight) hours as needed (for pain).    Yes [provider]  amiodarone (PACERONE) 200 MG tablet Take 1 tablet (200 mg total) by mouth daily. 07/12/20  Yes Allred, Jeneen Rinks, MD  atorvastatin (LIPITOR) 80 MG tablet TAKE 1 TABLET BY MOUTH  DAILY AT 6 PM Patient taking differently: Take 80 mg by mouth every evening. 04/19/20  Yes Lelon Perla, MD  bismuth subsalicylate (PEPTO BISMOL) 262 MG/15ML suspension Take 30 mLs by mouth every 6 (six) hours as needed for indigestion or diarrhea or loose stools.   Yes [provider]  carvedilol (COREG) 25 MG tablet Take 1 tablet (25 mg total) by mouth 2 (two) times daily with a meal. 04/19/20  Yes Allred, Jeneen Rinks, MD  Famotidine (PEPCID AC PO) Take 1 tablet by mouth 2 (two) times daily as needed (acid reflux).   Yes [provider]  feeding supplement (ENSURE ENLIVE / ENSURE PLUS) LIQD Try to drink 1 can 1-2 times a day. Patient taking differently: Take 237 mLs by mouth in the morning and at bedtime. strawberry 06/04/20  Yes Weaver, Scott T, PA-C  Fluticasone-Salmeterol (ADVAIR) 100-50 MCG/DOSE  AEPB Inhale 1 puff into the lungs 2 (two) times daily. 01/07/20  Yes Shirley Friar, PA-C  furosemide (LASIX) 40 MG tablet Take 0.5 tablets (20 mg total) by mouth daily. 04/19/20  Yes Allred, Jeneen Rinks, MD  hydrALAZINE (APRESOLINE) 25 MG tablet Take 1 tablet (25 mg total) by mouth 3 (three) times daily. 11/17/19 10/11/20 Yes Tillery, Satira Mccallum, PA-C  isosorbide mononitrate (IMDUR) 30 MG 24 hr tablet Take 0.5 tablets (15 mg total) by mouth daily. 11/17/19  Yes Shirley Friar, PA-C  Multiple Vitamin (MULTIVITAMIN) capsule Take 1 capsule by mouth daily.   Yes [provider]  nitroGLYCERIN (NITROSTAT) 0.4 MG SL tablet Place 1 tablet (0.4 mg total) under the tongue every 5 (five) minutes x 3 doses as needed for chest pain. 06/04/20 06/04/21 Yes Weaver, Scott T, PA-C  Omega-3 Fatty Acids (FISH OIL) 1200 MG CAPS Take 1,200 mg by mouth daily.   Yes [provider]  potassium chloride SA (KLOR-CON) 20 MEQ tablet TAKE 1 TABLET BY MOUTH  DAILY Patient taking differently: Take 20 mEq by mouth daily. 02/24/20  Yes Lelon Perla, MD  Pseudoeph-Doxylamine-DM-APAP (NYQUIL PO) Take 1 Dose by mouth at  bedtime as needed (sleep). Heart healty nyquil   Yes [provider]  rivaroxaban (XARELTO) 20 MG TABS tablet Take 1 tablet (20 mg total) by mouth daily with supper. Patient taking differently: Take 20 mg by mouth at bedtime. 03/31/20  Yes Baldwin Jamaica, PA-C  sacubitril-valsartan (ENTRESTO) 49-51 MG Take 1 tablet by mouth 2 (two) times daily. 03/31/20  Yes Baldwin Jamaica, PA-C  spironolactone (ALDACTONE) 25 MG tablet Take 1 tablet (25 mg total) by mouth at bedtime. 01/07/20  Yes Shirley Friar, PA-C  mometasone-formoterol (DULERA) 100-5 MCG/ACT AERO Inhale 2 puffs into the lungs in the morning and at bedtime. Patient not taking: Reported on 10/11/2020 07/12/20   Thompson Grayer, MD    Current Facility-Administered Medications  Medication Dose Route Frequency Provider  Last Rate Last Admin   0.9 %  sodium chloride infusion  250 mL Intravenous PRN Charlynne Cousins, MD       acetaminophen (TYLENOL) tablet 1,000 mg  1,000 mg Oral Q8H PRN Charlynne Cousins, MD       amiodarone (PACERONE) tablet 400 mg  400 mg Oral BID Charlynne Cousins, MD   400 mg at 10/15/20 0854   atorvastatin (LIPITOR) tablet 80 mg  80 mg Oral QPM Charlynne Cousins, MD   80 mg at 10/14/20 1744   carvedilol (COREG) tablet 6.25 mg  6.25 mg Oral BID WC Charlynne Cousins, MD   6.25 mg at 10/15/20 0854   Chlorhexidine Gluconate Cloth 2 % PADS 6 each  6 each Topical Daily Charlynne Cousins, MD   6 each at 10/14/20 1300   empagliflozin (JARDIANCE) tablet 10 mg  10 mg Oral Daily Charlynne Cousins, MD   10 mg at 10/14/20 1102   feeding supplement (ENSURE ENLIVE / ENSURE PLUS) liquid 237 mL  237 mL Oral BID PRN Charlynne Cousins, MD       MEDLINE mouth rinse  15 mL Mouth Rinse BID Charlynne Cousins, MD   15 mL at 10/14/20 2103   melatonin tablet 5 mg  5 mg Oral QHS Chotiner, Yevonne Aline, MD   5 mg at 10/15/20 0217   mexiletine (MEXITIL) capsule 200 mg  200 mg Oral Q12H Charlynne Cousins, MD   200 mg at 10/14/20 2101   mometasone-formoterol (DULERA) 100-5 MCG/ACT inhaler 2 puff  2 puff Inhalation BID Charlynne Cousins, MD   2 puff at 10/15/20 0903   morphine 2 MG/ML injection 2 mg  2 mg Intravenous Q1H PRN Charlynne Cousins, MD       nitroGLYCERIN (NITROSTAT) SL tablet 0.4 mg  0.4 mg Sublingual Q5 Min x 3 PRN Charlynne Cousins, MD   0.4 mg at 10/12/20 0235   omega-3 acid ethyl esters (LOVAZA) capsule 1 g  1 g Oral Daily Charlynne Cousins, MD   1 g at 10/14/20 1100   ondansetron (ZOFRAN) injection 4 mg  4 mg Intravenous Q6H PRN Charlynne Cousins, MD       pantoprazole (PROTONIX) injection 40 mg  40 mg Intravenous Q12H Charlynne Cousins, MD   40 mg at 10/15/20 0856   rivaroxaban (XARELTO) tablet 20 mg  20 mg Oral Q supper Charlynne Cousins, MD   20 mg at  10/14/20 1744   sacubitril-valsartan (ENTRESTO) 97-103 mg per tablet  1 tablet Oral BID Charlynne Cousins, MD   1 tablet at 10/14/20 2101   sodium chloride flush (NS) 0.9 % injection 3 mL  3  mL Intravenous Q12H Charlynne Cousins, MD   3 mL at 10/15/20 0856   sodium chloride flush (NS) 0.9 % injection 3 mL  3 mL Intravenous PRN Charlynne Cousins, MD       spironolactone (ALDACTONE) tablet 12.5 mg  12.5 mg Oral Daily Charlynne Cousins, MD   12.5 mg at 10/15/20 0855    Allergies as of 10/10/2020   (No Known Allergies)    Family History  Problem Relation Age of Onset   Cirrhosis Mother        due to ETOH   Cancer Neg Hx     Social History   Socioeconomic History   Marital status: Married    Spouse name: Not on file   Number of children: Not on file   Years of education: Not on file   Highest education level: Not on file  Occupational History   Occupation: Lawn care  Tobacco Use   Smoking status: Every Day    Types: Cigars   Smokeless tobacco: Never   Tobacco comments:    smokes cigars now, no longer smokes cigarettes but previously smoked 2ppd  Vaping Use   Vaping Use: Never used  Substance and Sexual Activity   Alcohol use: No   Drug use: No   Sexual activity: Not Currently    Partners: Female    Birth control/protection: None  Other Topics Concern   Not on file  Social History Narrative   Not on file   Social Determinants of Health   Financial Resource Strain: Not on file  Food Insecurity: Not on file  Transportation Needs: Not on file  Physical Activity: Not on file  Stress: Not on file  Social Connections: Not on file  Intimate Partner Violence: Not on file    Review of Systems: Gen: Denies fever, sweats or chills. No weight loss.  CV: See HPI.   Resp: Denies cough, shortness of breath of hemoptysis.  GI: See HPI.   GU : Denies urinary burning, blood in urine, increased urinary frequency or incontinence. MS: Denies joint pain, muscles aches  or weakness. Derm: Denies rash, itchiness, skin lesions or unhealing ulcers. Psych: Denies depression, anxiety or memory loss. Heme: Denies easy bruising, bleeding. Neuro:  Denies headaches, dizziness or paresthesias. Endo:  Denies any problems with DM, thyroid or adrenal function.  Physical Exam: Vital signs in last 24 hours: Temp:  [98.4 F (36.9 C)] 98.4 F (36.9 C) (09/11 [id number]) Pulse Rate:  [60-68] 65 (09/11 0757) Resp:  [16-19] 16 (09/11 0633) BP: (119-175)/(61-81) 136/61 (09/11 0633) SpO2:  [92 %-98 %] 95 % (09/11 0904) FiO2 (%):  [28 %] 28 % (09/11 0904) Last BM Date: 10/14/20 General:  Alert 75 year old male in no acute distress. Head:  Normocephalic and atraumatic. Eyes:  No scleral icterus. Conjunctiva pink. Ears:  Normal auditory acuity. Nose:  No deformity, discharge or lesions. Mouth: Absent dentition.  No ulcers or lesions.  Neck:  Supple. No lymphadenopathy or thyromegaly.  Lungs: Breath sounds coarse and diminished throughout right and left lung fields.  On oxygen 2 L nasal cannula. Heart: Distant S1, S2.  Regular rate and rhythm.  No murmur. Abdomen: Soft, nondistended.  Nontender.  Positive bowel sounds all 4 quadrants.  No hepatosplenomegaly.  No bruit. Rectal: Deferred. Musculoskeletal:  Symmetrical without gross deformities.  Pulses:  Normal pulses noted. Extremities:  Without clubbing or edema. Neurologic:  Alert and  oriented x4. No focal deficits.  Skin:  Intact without significant lesions or rashes. Psych:  Alert and cooperative. Normal mood and affect.  Intake/Output from previous day: 09/10 0701 - 09/11 0700 In: 480 [P.O.:480] Out: 150 [Urine:150] Intake/Output this shift: No intake/output data recorded.  Lab Results: Recent Labs    10/13/20 0223 10/14/20 0933  WBC 12.2* 8.1  HGB 12.9* 12.2*  HCT 37.9* 36.2*  PLT 192 198   BMET Recent Labs    10/13/20 0223 10/14/20 0217  NA 134* 136  K 4.2 3.9  CL 101 104  CO2 22 25  GLUCOSE  107* 93  BUN 8 7*  CREATININE 0.95 1.08  CALCIUM 8.3* 8.6*   LFT No results for input(s): PROT, ALBUMIN, AST, ALT, ALKPHOS, BILITOT, BILIDIR, IBILI in the last 72 hours. PT/INR No results for input(s): LABPROT, INR in the last 72 hours. Hepatitis Panel No results for input(s): HEPBSAG, HCVAB, HEPAIGM, HEPBIGM in the last 72 hours.    Studies/Results: CT ABDOMEN PELVIS WO CONTRAST  Result Date: 10/14/2020 CLINICAL DATA:  Acute, non localized abdominal pain. EXAM: CT ABDOMEN AND PELVIS WITHOUT CONTRAST TECHNIQUE: Multidetector CT imaging of the abdomen and pelvis was performed following the standard protocol without IV contrast. COMPARISON:  10/10/2020 FINDINGS: Lower chest: No significant change in a small pericardial effusion with a maximum thickness of 1.0 cm. Stable pacemaker and AICD leads. Interval minimal bilateral pleural fluid and patchy density in the left lower lobe with moderate to marked bilateral peribronchial thickening with progression. Hepatobiliary: No focal liver abnormality is seen. No gallstones, gallbladder wall thickening, or biliary dilatation. Pancreas: Unremarkable. No pancreatic ductal dilatation or surrounding inflammatory changes. Spleen: Normal in size without focal abnormality. Adrenals/Urinary Tract: A heterogeneous right adrenal mass is again demonstrated measuring 4.3 x 3.3 cm on image number 17/3. Normal appearing left adrenal gland. Stable bilateral renal cysts. Unremarkable ureters and urinary bladder. Stomach/Bowel: Multiple descending and sigmoid colon diverticula without evidence of diverticulitis. Normal appearing appendix. Small hiatal hernia.  Unremarkable small bowel. Vascular/Lymphatic: Stable aorto bi-iliac bypass stent graft within a thrombosed infrarenal abdominal aorta with maximum dimensions of 4.7 cm on image number 37/3. Atheromatous arterial calcifications. No enlarged lymph nodes. Reproductive: Markedly enlarged prostate gland protruding into the  base of the urinary bladder. Other: Small to moderate-sized left inguinal hernia containing fat and a short segment of a loop of distal small bowel without bowel obstruction. Small right inguinal hernia containing fat. Musculoskeletal: Lumbar and lower thoracic spine degenerative changes. IMPRESSION: 1. No acute abnormality. 2. Stable right adrenal mass, previously shown to be stable since 10/14/2003, compatible with a benign process. 3. Stable marked prostatic hypertrophy. 4. Small to moderate-sized left inguinal hernia containing fat and a short segment of a loop of distal small bowel without obstruction. 5. Colonic diverticulosis, small hiatal hernia and small pericardial effusion, without significant change. Electronically Signed   By: Claudie Revering M.D.   On: 10/14/2020 17:15    IMPRESSION/PLAN:  53) 75 year old male with lower abdominal pain x 3 to 6 months with remittent nausea and vomiting (nonbloody emesis) x 2 to 3 weeks.  CTAP x 2 without evidence of any intra-abdominal/pelvic inflammatory/infectious process or mass. -Pantoprazole '40mg'$  po QD -Ondansetron 4 mg p.o. or IV every 6 hours as needed -Diet as tolerated -No plans for endoscopic evaluation at this juncture, await further recommendations per Dr.Jaydien Panepinto  2) Significant coronary artery disease s/p cardiac catheterization 9/8 showed the left main with a 75% blockage to be treated medically  3) Ventricular tachycardia s/p audio version which required IV mio and lidocaine.  Remains in normal sinus  rhythm.  4) Ischemic cardiomyopathy.  LVEF 30 to 35% with ICD in place.  On Entresto.  5) Paroxysmal atrial fibrillation on Xarelto  5) History of adenomatous colon polyps per colonoscopy in 2009  6) Normocytic anemia.  No overt GI bleeding. -CBC, iron, TIBC, 10 and B 12 level in a.m.  7) Mildly elevated LFTs.  Labs 9/7: AST 49.  ALT 64. -Hepatic panel in am -RUQ abdominal sonogram if LFTs remain elevated    Noralyn Pick   10/15/2020, 9:40 AM   Attending physician's note   I have taken an interval history, reviewed the chart and examined the patient. I agree with the Advanced Practitioner's note, impression and recommendations.   L inguinal pain with inguinal hernia confirmed on CT AP x 2. On CT done yesterday, hernia does contain a small bowel loop without obstruction/strangulation. Intermittent N/V (better) Ventricular tachycardia s/p cardioversion 9/6 on amiodarone Ischemic cardiomyopathy Paroxysmal A. Fib on Xeralto H/O colon polyps 05/2007  Plan: -Not a candidate for surgery for inguinal hernia repair at this time. -Not a candidate for any endoscopic eval. -Can continue Protonix 40 QD -Can continue Pepcid '20mg'$  before supper -Trial of Bentyl '10mg'$  PO BID, 1/2hr before meals for colonic spasms if OK with cardiology.  May help with mild diarrhea. -If develops significant L inguinal pain/bulge/tenderness, recommend surgical consultation. -Will standby for now. -D/W pt and pt's wife.     Carmell Austria, MD [facility] [phone]

## 2020-10-15 NOTE — Progress Notes (Signed)
[facility] PROGRESS NOTE    Progress Note  Benjamin Cook  [id number] DOB: 28-Mar-1945 DOA: 10/10/2020 PCP: Merryl Hacker, No     Brief Narrative:   TAHJEE Cook is an 75 y.o. male past medical history of CAD status post PCI, chronic systolic heart failure status post AICD placement, paroxysmal atrial fibrillation on Xarelto comes into the ED having nausea vomiting and abdominal discomfort has been going on for the last 4 weeks in the ER he was found to be in V. tach cardiology was consulted who cardioverted the patient, CT scan of the abdomen and pelvis was unremarkable mildly elevated LFTs hemoglobin of 13 COVID test was negative was started on amiodarone bolus and infusion.  Status post left heart cath revealed 75% stenosis of left main.  Cardiology recommended given her overall condition he would not be a borderline candidate for coronary artery bypass and graft.  It was discussed with the patient he would not like to proceed with CABG.  Cardiology has elected to treat medically. Electrophysiology is also on board, recommended to continue oral amiodarone and mexiletine. Maximize antianginal and CAD therapy  Assessment/Plan:   V tach [facility]): Check a magnesium level and replete as needed. VT rate will be set low the first detection zone.  Ischemic cardiomyopathy/chronic systolic heart failure: With a 2D echo showing an EF of 30% AICD in place.  The cardiac cath that showed 75% stenosis to the left main Currently on Entresto, Jardiance, Aldactone and Coreg.  Titrate as tolerated per cardiology. Appears euvolemic on physical exam.  History of chronic normocytic anemia: No signs of oral bleeding continue to follow CBC.  Paroxysmal atrial fibrillation ([name]) Rate controlled on beta-blocker and amiodarone can be restarted on Xarelto once procedures are completed.  Suprapubic and left lower quadrant abdominal pain: Patient related that his symptoms were related to his hydralazine  this was stopped. Continue PPI, continues to have abdominal pain every time they give him all of his pills. Patient continues to have abdominal pain.   Repeated CT scan showed no acute findings.  He has remained afebrile with leukocytosis is resolved We will go ahead and consult GI.  DVT prophylaxis: Lovenox Family Communication: Patient's wife Status is: Inpatient  Remains inpatient appropriate because:Hemodynamically unstable  Dispo: The patient is from: Home              Anticipated d/c is to: SNF              Patient currently is not medically stable to d/c.   Difficult to place patient No        Code Status:     Code Status Orders  (From admission, onward)           Start     Ordered   10/11/20 0204  Full code  Continuous        10/11/20 0204           Code Status History     Date Active Date Inactive Code Status Order ID Comments User Context   06/01/2020 1746 06/04/2020 1900 Full Code [id number]  Baldwin Jamaica, PA-C ED   04/22/2019 0348 04/22/2019 2354 Full Code [id number]  Clois Dupes, MD ED   03/03/2017 1329 03/05/2017 1321 Full Code [id number]  Gabriel Earing, PA-C Inpatient   11/24/2016 1549 11/30/2016 1643 Full Code [id number]  Rondel Jumbo, PA-C ED   11/15/2016 1542 11/18/2016 1923 Full Code [id number]  Roxan Hockey, MD ED   07/16/2016 1951 07/18/2016  Ruth Full Code [id number]  Leanor Kail, [place] Inpatient   03/28/2015 1643 03/30/2015 1525 Full Code [id number]  Waldemar Dickens, MD ED   05/27/2014 1514 05/27/2014 2037 Full Code [id number]  Sherren Mocha, MD Inpatient   03/18/2014 1132 03/18/2014 1629 Full Code [id number]  Thompson Grayer, MD Inpatient         IV Access:   Peripheral IV   Procedures and diagnostic studies:   CT ABDOMEN PELVIS WO CONTRAST  Result Date: 10/14/2020 CLINICAL DATA:  Acute, non localized abdominal pain. EXAM: CT ABDOMEN AND PELVIS WITHOUT CONTRAST TECHNIQUE: Multidetector CT imaging of the abdomen and pelvis  was performed following the standard protocol without IV contrast. COMPARISON:  10/10/2020 FINDINGS: Lower chest: No significant change in a small pericardial effusion with a maximum thickness of 1.0 cm. Stable pacemaker and AICD leads. Interval minimal bilateral pleural fluid and patchy density in the left lower lobe with moderate to marked bilateral peribronchial thickening with progression. Hepatobiliary: No focal liver abnormality is seen. No gallstones, gallbladder wall thickening, or biliary dilatation. Pancreas: Unremarkable. No pancreatic ductal dilatation or surrounding inflammatory changes. Spleen: Normal in size without focal abnormality. Adrenals/Urinary Tract: A heterogeneous right adrenal mass is again demonstrated measuring 4.3 x 3.3 cm on image number 17/3. Normal appearing left adrenal gland. Stable bilateral renal cysts. Unremarkable ureters and urinary bladder. Stomach/Bowel: Multiple descending and sigmoid colon diverticula without evidence of diverticulitis. Normal appearing appendix. Small hiatal hernia.  Unremarkable small bowel. Vascular/Lymphatic: Stable aorto bi-iliac bypass stent graft within a thrombosed infrarenal abdominal aorta with maximum dimensions of 4.7 cm on image number 37/3. Atheromatous arterial calcifications. No enlarged lymph nodes. Reproductive: Markedly enlarged prostate gland protruding into the base of the urinary bladder. Other: Small to moderate-sized left inguinal hernia containing fat and a short segment of a loop of distal small bowel without bowel obstruction. Small right inguinal hernia containing fat. Musculoskeletal: Lumbar and lower thoracic spine degenerative changes. IMPRESSION: 1. No acute abnormality. 2. Stable right adrenal mass, previously shown to be stable since 10/14/2003, compatible with a benign process. 3. Stable marked prostatic hypertrophy. 4. Small to moderate-sized left inguinal hernia containing fat and a short segment of a loop of distal  small bowel without obstruction. 5. Colonic diverticulosis, small hiatal hernia and small pericardial effusion, without significant change. Electronically Signed   By: Claudie Revering M.D.   On: 10/14/2020 17:15     Medical Consultants:   None.   Subjective:    [name] tolerating his diet this morning  Objective:    Vitals:   10/14/20 1949 10/14/20 2003 10/15/20 0633 10/15/20 0757  BP: 119/81  136/61   Pulse: 61  60 65  Resp: 19  16   Temp: 98.4 F (36.9 C)  98.4 F (36.9 C)   TempSrc: Oral  Oral   SpO2: 95% 92% 96% 97%  Weight:      Height:       SpO2: 97 % O2 Flow Rate (L/min): 2 L/min FiO2 (%): 21 %   Intake/Output Summary (Last 24 hours) at 10/15/2020 0903 Last data filed at 10/14/2020 2200 Gross per 24 hour  Intake 480 ml  Output 150 ml  Net 330 ml    Filed Weights   10/10/20 2012 10/12/20 0500 10/13/20 0458  Weight: 63 kg 62.7 kg 62.2 kg    Exam: General exam: In no acute distress. Respiratory system: Good air movement and clear to auscultation. Cardiovascular system: S1 & S2 heard, RRR. No JVD. Gastrointestinal system: Abdomen  is nondistended, soft and nontender.  Extremities: No pedal edema. Skin: No rashes, lesions or ulcers  Data Reviewed:    Labs: Basic Metabolic Panel: Recent Labs  Lab 10/10/20 2023 10/11/20 1101 10/12/20 0537 10/13/20 0223 10/14/20 0217  NA 137 134* 135 134* 136  K 4.3 4.1 3.7 4.2 3.9  CL 103 102 104 101 104  CO2 '26 25 24 22 25  '$ GLUCOSE 104* 101* 114* 107* 93  BUN '13 13 9 8 '$ 7*  CREATININE 1.19 1.04 0.98 0.95 1.08  CALCIUM 9.3 9.1 8.7* 8.3* 8.6*  MG  --  1.9 1.9  --   --     GFR Estimated Creatinine Clearance: 50.4 mL/min (by C-G formula based on SCr of 1.08 mg/dL). Liver Function Tests: Recent Labs  Lab 10/10/20 2023 10/11/20 1101  AST 58* 49*  ALT 77* 64*  ALKPHOS 70 61  BILITOT 0.7 0.5  PROT 7.1 6.7  ALBUMIN 3.4* 3.1*    Recent Labs  Lab 10/10/20 2023  LIPASE 33    No results for  input(s): AMMONIA in the last 168 hours. Coagulation profile No results for input(s): INR, PROTIME in the last 168 hours. COVID-19 Labs  No results for input(s): DDIMER, FERRITIN, LDH, CRP in the last 72 hours.  Lab Results  Component Value Date   SARSCOV2NAA NEGATIVE 10/10/2020   SARSCOV2NAA NEGATIVE 06/01/2020   [place] NEGATIVE 04/22/2019   [place] Not Detected 02/04/2019    CBC: Recent Labs  Lab 10/10/20 2023 10/11/20 1101 10/12/20 0537 10/13/20 0223 10/14/20 0933  WBC 11.7* 9.5 13.2* 12.2* 8.1  NEUTROABS 8.5*  --   --   --  5.7  HGB 12.9* 13.6 13.9 12.9* 12.2*  HCT 39.5 39.6 40.1 37.9* 36.2*  MCV 96.3 93.2 91.3 91.8 93.5  PLT 260 232 204 192 198    Cardiac Enzymes: No results for input(s): CKTOTAL, CKMB, CKMBINDEX, TROPONINI in the last 168 hours. BNP (last 3 results) Recent Labs    07/12/20 1017  PROBNP 309    CBG: Recent Labs  Lab 10/11/20 2008  GLUCAP 124*    D-Dimer: No results for input(s): DDIMER in the last 72 hours. Hgb A1c: No results for input(s): HGBA1C in the last 72 hours. Lipid Profile: No results for input(s): CHOL, HDL, LDLCALC, TRIG, CHOLHDL, LDLDIRECT in the last 72 hours. Thyroid function studies: No results for input(s): TSH, T4TOTAL, T3FREE, THYROIDAB in the last 72 hours.  Invalid input(s): FREET3  Anemia work up: No results for input(s): VITAMINB12, FOLATE, FERRITIN, TIBC, IRON, RETICCTPCT in the last 72 hours. Sepsis Labs: Recent Labs  Lab 10/11/20 1101 10/12/20 0537 10/13/20 0223 10/14/20 0933  WBC 9.5 13.2* 12.2* 8.1    Microbiology Recent Results (from the past 240 hour(s))  Resp Panel by RT-PCR (Flu A&B, Covid) Nasopharyngeal Swab     Status: None   Collection Time: 10/10/20  8:56 PM   Specimen: Nasopharyngeal Swab; Nasopharyngeal(NP) swabs in vial transport medium  Result Value Ref Range Status   SARS Coronavirus 2 by RT PCR NEGATIVE NEGATIVE Final    Comment: (NOTE) SARS-CoV-2 target nucleic acids  are NOT DETECTED.  The SARS-CoV-2 RNA is generally detectable in upper respiratory specimens during the acute phase of infection. The lowest concentration of SARS-CoV-2 viral copies this assay can detect is 138 copies/mL. A negative result does not preclude SARS-Cov-2 infection and should not be used as the sole basis for treatment or other patient management decisions. A negative result may occur with  improper specimen collection/handling, submission  of specimen other than nasopharyngeal swab, presence of viral mutation(s) within the areas targeted by this assay, and inadequate number of viral copies(<138 copies/mL). A negative result must be combined with clinical observations, patient history, and epidemiological information. The expected result is Negative.  Fact Sheet for Patients:  EntrepreneurPulse.com.au  Fact Sheet for Healthcare Providers:  IncredibleEmployment.be  This test is no t yet approved or cleared by the [place] FDA and  has been authorized for detection and/or diagnosis of SARS-CoV-2 by FDA under an Emergency Use Authorization (EUA). This EUA will remain  in effect (meaning this test can be used) for the duration of the COVID-19 declaration under Section 564(b)(1) of the Act, 21 U.S.C.section 360bbb-3(b)(1), unless the authorization is terminated  or revoked sooner.       Influenza A by PCR NEGATIVE NEGATIVE Final   Influenza B by PCR NEGATIVE NEGATIVE Final    Comment: (NOTE) The Xpert Xpress SARS-CoV-2/FLU/RSV plus assay is intended as an aid in the diagnosis of influenza from Nasopharyngeal swab specimens and should not be used as a sole basis for treatment. Nasal washings and aspirates are unacceptable for Xpert Xpress SARS-CoV-2/FLU/RSV testing.  Fact Sheet for Patients: EntrepreneurPulse.com.au  Fact Sheet for Healthcare Providers: IncredibleEmployment.be  This test is  not yet approved or cleared by the [place] FDA and has been authorized for detection and/or diagnosis of SARS-CoV-2 by FDA under an Emergency Use Authorization (EUA). This EUA will remain in effect (meaning this test can be used) for the duration of the COVID-19 declaration under Section 564(b)(1) of the Act, 21 U.S.C. section 360bbb-3(b)(1), unless the authorization is terminated or revoked.  Performed at [facility], [employer] [address], [place], [place] [address]   MRSA Next Gen by PCR, Nasal     Status: None   Collection Time: 10/11/20  8:31 PM   Specimen: Nasal Mucosa; Nasal Swab  Result Value Ref Range Status   MRSA by PCR Next Gen NOT DETECTED NOT DETECTED Final    Comment: (NOTE) The GeneXpert MRSA Assay (FDA approved for NASAL specimens only), is one component of a comprehensive MRSA colonization surveillance program. It is not intended to diagnose MRSA infection nor to guide or monitor treatment for MRSA infections. Test performance is not FDA approved in patients less than 35 years old. Performed at [facility], [place] [address] [place], [place] [address]      Medications:    amiodarone  400 mg Oral BID   atorvastatin  80 mg Oral QPM   carvedilol  6.25 mg Oral BID WC   Chlorhexidine Gluconate Cloth  6 each Topical Daily   empagliflozin  10 mg Oral Daily   mouth rinse  15 mL Mouth Rinse BID   melatonin  5 mg Oral QHS   mexiletine  200 mg Oral Q12H   mometasone-formoterol  2 puff Inhalation BID   omega-3 acid ethyl esters  1 g Oral Daily   pantoprazole (PROTONIX) IV  40 mg Intravenous Q12H   rivaroxaban  20 mg Oral Q supper   sacubitril-valsartan  1 tablet Oral BID   sodium chloride flush  3 mL Intravenous Q12H   spironolactone  12.5 mg Oral Daily   Continuous Infusions:  sodium chloride        LOS: 4 days   Charlynne Cousins  [facility]  10/15/2020, 9:03 AM

## 2020-10-15 NOTE — Progress Notes (Signed)
Progress Note  Patient Name: Benjamin Cook Date of Encounter: 10/15/2020  [facility] Cardiologist: Kirk Ruths, MD   Subjective   No CP or dyspnea; no abdominal pain today  Inpatient Medications    Scheduled Meds:  amiodarone  400 mg Oral BID   atorvastatin  80 mg Oral QPM   carvedilol  6.25 mg Oral BID WC   Chlorhexidine Gluconate Cloth  6 each Topical Daily   empagliflozin  10 mg Oral Daily   mouth rinse  15 mL Mouth Rinse BID   melatonin  5 mg Oral QHS   mexiletine  200 mg Oral Q12H   mometasone-formoterol  2 puff Inhalation BID   omega-3 acid ethyl esters  1 g Oral Daily   pantoprazole (PROTONIX) IV  40 mg Intravenous Q12H   rivaroxaban  20 mg Oral Q supper   sacubitril-valsartan  1 tablet Oral BID   sodium chloride flush  3 mL Intravenous Q12H   spironolactone  12.5 mg Oral Daily   Continuous Infusions:  sodium chloride     PRN Meds: sodium chloride, acetaminophen, feeding supplement, morphine injection, nitroGLYCERIN, ondansetron (ZOFRAN) IV, sodium chloride flush   Vital Signs    Vitals:   10/14/20 1949 10/14/20 2003 10/15/20 0633 10/15/20 0757  BP: 119/81  136/61   Pulse: 61  60 65  Resp: 19  16   Temp: 98.4 F (36.9 C)  98.4 F (36.9 C)   TempSrc: Oral  Oral   SpO2: 95% 92% 96% 97%  Weight:      Height:        Intake/Output Summary (Last 24 hours) at 10/15/2020 0831 Last data filed at 10/14/2020 2200 Gross per 24 hour  Intake 480 ml  Output 150 ml  Net 330 ml   Last 3 Weights 10/13/2020 10/12/2020 10/10/2020  Weight (lbs) 137 lb 2 oz 138 lb 3.7 oz 138 lb 14.2 oz  Weight (kg) 62.2 kg 62.7 kg 63 kg      Telemetry    Sinus rhythm with ventricular tachycardia yesterday.- Personally Reviewed   Physical Exam   GEN: No acute distress.   Neck: No JVD Cardiac: RRR Respiratory: Clear to auscultation bilaterally; ICD left chest GI: Soft, nontender, non-distended  MS: No edema Neuro:  Nonfocal  Psych: Normal affect   Labs      Chemistry Recent Labs  Lab 10/10/20 2023 10/11/20 1101 10/12/20 0537 10/13/20 0223 10/14/20 0217  NA 137 134* 135 134* 136  K 4.3 4.1 3.7 4.2 3.9  CL 103 102 104 101 104  CO2 '26 25 24 22 25  '$ GLUCOSE 104* 101* 114* 107* 93  BUN '13 13 9 8 '$ 7*  CREATININE 1.19 1.04 0.98 0.95 1.08  CALCIUM 9.3 9.1 8.7* 8.3* 8.6*  PROT 7.1 6.7  --   --   --   ALBUMIN 3.4* 3.1*  --   --   --   AST 58* 49*  --   --   --   ALT 77* 64*  --   --   --   ALKPHOS 70 61  --   --   --   BILITOT 0.7 0.5  --   --   --   GFRNONAA >60 >60 >60 >60 >60  ANIONGAP '8 7 7 11 7     '$ Hematology Recent Labs  Lab 10/12/20 0537 10/13/20 0223 10/14/20 0933  WBC 13.2* 12.2* 8.1  RBC 4.39 4.13* 3.87*  HGB 13.9 12.9* 12.2*  HCT 40.1 37.9* 36.2*  MCV 91.3 91.8 93.5  MCH 31.7 31.2 31.5  MCHC 34.7 34.0 33.7  RDW 14.9 14.9 15.4  PLT 204 192 198     Radiology    CT ABDOMEN PELVIS WO CONTRAST  Result Date: 10/14/2020 CLINICAL DATA:  Acute, non localized abdominal pain. EXAM: CT ABDOMEN AND PELVIS WITHOUT CONTRAST TECHNIQUE: Multidetector CT imaging of the abdomen and pelvis was performed following the standard protocol without IV contrast. COMPARISON:  10/10/2020 FINDINGS: Lower chest: No significant change in a small pericardial effusion with a maximum thickness of 1.0 cm. Stable pacemaker and AICD leads. Interval minimal bilateral pleural fluid and patchy density in the left lower lobe with moderate to marked bilateral peribronchial thickening with progression. Hepatobiliary: No focal liver abnormality is seen. No gallstones, gallbladder wall thickening, or biliary dilatation. Pancreas: Unremarkable. No pancreatic ductal dilatation or surrounding inflammatory changes. Spleen: Normal in size without focal abnormality. Adrenals/Urinary Tract: A heterogeneous right adrenal mass is again demonstrated measuring 4.3 x 3.3 cm on image number 17/3. Normal appearing left adrenal gland. Stable bilateral renal cysts. Unremarkable  ureters and urinary bladder. Stomach/Bowel: Multiple descending and sigmoid colon diverticula without evidence of diverticulitis. Normal appearing appendix. Small hiatal hernia.  Unremarkable small bowel. Vascular/Lymphatic: Stable aorto bi-iliac bypass stent graft within a thrombosed infrarenal abdominal aorta with maximum dimensions of 4.7 cm on image number 37/3. Atheromatous arterial calcifications. No enlarged lymph nodes. Reproductive: Markedly enlarged prostate gland protruding into the base of the urinary bladder. Other: Small to moderate-sized left inguinal hernia containing fat and a short segment of a loop of distal small bowel without bowel obstruction. Small right inguinal hernia containing fat. Musculoskeletal: Lumbar and lower thoracic spine degenerative changes. IMPRESSION: 1. No acute abnormality. 2. Stable right adrenal mass, previously shown to be stable since 10/14/2003, compatible with a benign process. 3. Stable marked prostatic hypertrophy. 4. Small to moderate-sized left inguinal hernia containing fat and a short segment of a loop of distal small bowel without obstruction. 5. Colonic diverticulosis, small hiatal hernia and small pericardial effusion, without significant change. Electronically Signed   By: Claudie Revering M.D.   On: 10/14/2020 17:15    Patient Profile     75 y.o. male with past medical history of ischemic cardiomyopathy, chronic systolic congestive heart failure, prior ventricular tachycardia, prior ICD, paroxysmal atrial fibrillation, hypertension, hyperlipidemia, previous abdominal aortic aneurysm repair, COPD admitted with recurrent ventricular tachycardia.    Assessment & Plan    1 ventricular tachycardia-patient had recurrent ventricular tachycardia yesterday but remains in sinus today.  We will continue amiodarone and mexiletine at present dose.  I will ask Dr. Caryl Comes to review for need of device reprogramming.     2 ischemic cardiomyopathy-echocardiogram shows  ejection fraction 30 to 35%.  ICD in place.  Continue Entresto and carvedilol.  We will titrate carvedilol as tolerated by blood pressure as an outpatient.  Continue Jardiance and spironolactone.  I will not resume hydralazine/nitrates unless blood pressure remains high following titration of carvedilol and Entresto.     3 chronic systolic congestive heart failure-euvolemic on examination.  We will continue to follow.   4 history of paroxysmal atrial fibrillation-patient is in sinus rhythm.  Continue beta-blocker, amiodarone and apixaban.   5 coronary artery disease-continue statin.  Note cardiac catheterization revealed 75% left main.  Given his overall medical condition he would be a borderline candidate for coronary artery bypass and graft.  He also states that he would not consider CABG.  We will treat medically.  6 abdominal  pain-patient had recurrent abdominal pain yesterday evening.  Abdominal CT unrevealing.  May need gastroenterology consult.  Potential discharge tomorrow if stable.  For questions or updates, please contact [place] Please consult [email or web address] for contact info under        Signed, Kirk Ruths, MD  10/15/2020, 8:31 AM

## 2020-10-15 NOTE — Plan of Care (Signed)

## 2020-10-15 NOTE — Evaluation (Signed)
Physical Therapy Evaluation Patient Details Name: Benjamin Cook MRN: [id number] DOB: January 28, 1946 Today's Date: 10/15/2020   History of Present Illness  75 y.o. male presents to [facility] ED on 10/10/2020 with nausea and vomiting for 4 weeks. Pt found to be in V tach in ED, underwent cardioversion on 9/7. Pt underwent left heart cath on 9/8 revealing 75% stenosis of left main coronary artery. Cardiology and patient electing for medical treatment. PMH includes AICD, anxiety, OA, CAD, CHF, COPD, HTN, HLD, PVD.  Clinical Impression  Pt presents to PT with deficits in activity tolerance and balance, but is not far from baseline per patient and spouse report. Pt is able to ambulate and transfer without physical assistance. Pt is active at baseline, mowing lawns for a living, and will benefit from continued acute PT services to improve activity tolerance and aide in a return to his prior level of function.    Follow Up Recommendations No PT follow up    Equipment Recommendations  None recommended by PT    Recommendations for Other Services       Precautions / Restrictions Precautions Precautions: Fall Precaution Comments: ICD Restrictions Weight Bearing Restrictions: No      Mobility  Bed Mobility Overal bed mobility: Independent                  Transfers Overall transfer level: Independent Equipment used: None                Ambulation/Gait Ambulation/Gait assistance: Supervision Gait Distance (Feet): 120 Feet Assistive device: None Gait Pattern/deviations: Step-through pattern Gait velocity: functional Gait velocity interpretation: 1.31 - 2.62 ft/sec, indicative of limited community ambulator General Gait Details: pt with step-through gait, mild lateral drift when ambulating  Stairs            Wheelchair Mobility    Modified Rankin (Stroke Patients Only)       Balance Overall balance assessment: Mild deficits observed, not formally tested                                            Pertinent Vitals/Pain Pain Assessment: No/denies pain    Home Living Family/patient expects to be discharged to:: Private residence Living Arrangements: Spouse/significant other Available Help at Discharge: Family;Available 24 hours/day Type of Home: House Home Access: Level entry     Home Layout: One level Home Equipment: Walker - 2 wheels;Crutches;Cane - single point;Hand held shower head;Grab bars - tub/shower;Shower seat - built in      Prior Function Level of Independence: Independent         Comments: pt still works Land        Extremity/Trunk Assessment   Upper Extremity Assessment Upper Extremity Assessment: Overall WFL for tasks assessed    Lower Extremity Assessment Lower Extremity Assessment: Overall WFL for tasks assessed    Cervical / Trunk Assessment Cervical / Trunk Assessment: Kyphotic  Communication   Communication: No difficulties  Cognition Arousal/Alertness: Awake/alert Behavior During Therapy: WFL for tasks assessed/performed Overall Cognitive Status: Within Functional Limits for tasks assessed                                        General Comments General comments (skin integrity, edema, etc.): VSS on RA,  no vtach noted during mobility    Exercises     Assessment/Plan    PT Assessment Patient needs continued PT services  PT Problem List Decreased activity tolerance;Decreased balance;Cardiopulmonary status limiting activity       PT Treatment Interventions Gait training;Therapeutic activities;Therapeutic exercise;Balance training;Neuromuscular re-education;Patient/family education    PT Goals (Current goals can be found in the Care Plan section)  Acute Rehab PT Goals Patient Stated Goal: to go home PT Goal Formulation: With patient/family Time For Goal Achievement: 10/29/20 Potential to Achieve Goals: Good Additional Goals Additional  Goal #1: Pt will score >19/24 on DGI to indicate a reduced risk for falls    Frequency Min 3X/week   Barriers to discharge        Co-evaluation               AM-PAC PT "6 Clicks" Mobility  Outcome Measure Help needed turning from your back to your side while in a flat bed without using bedrails?: None Help needed moving from lying on your back to sitting on the side of a flat bed without using bedrails?: None Help needed moving to and from a bed to a chair (including a wheelchair)?: None Help needed standing up from a chair using your arms (e.g., wheelchair or bedside chair)?: None Help needed to walk in hospital room?: A Little Help needed climbing 3-5 steps with a railing? : A Little 6 Click Score: 22    End of Session   Activity Tolerance: Patient tolerated treatment well Patient left: in bed;with call bell/phone within reach;with family/visitor present Nurse Communication: Mobility status PT Visit Diagnosis: Other abnormalities of gait and mobility (R26.89)    Time: [id number] PT Time Calculation (min) (ACUTE ONLY): 15 min   Charges:   PT Evaluation $PT Eval Low Complexity: Ambler, PT, DPT Acute Rehabilitation Pager: [phone]   Zenaida Niece 10/15/2020, 2:48 PM

## 2020-10-16 ENCOUNTER — Other Ambulatory Visit (HOSPITAL_COMMUNITY): Payer: Self-pay

## 2020-10-16 DIAGNOSIS — I472 Ventricular tachycardia: Secondary | ICD-10-CM | POA: Diagnosis not present

## 2020-10-16 MED ORDER — MEXILETINE HCL 200 MG PO CAPS
200.0000 mg | ORAL_CAPSULE | Freq: Two times a day (BID) | ORAL | 3 refills | Status: DC
  Filled 2020-10-16: qty 60, 30d supply, fill #0

## 2020-10-16 MED ORDER — EMPAGLIFLOZIN 10 MG PO TABS
10.0000 mg | ORAL_TABLET | Freq: Every day | ORAL | 3 refills | Status: DC
  Filled 2020-10-16: qty 14, 14d supply, fill #0

## 2020-10-16 MED ORDER — MELATONIN 5 MG PO TABS
5.0000 mg | ORAL_TABLET | Freq: Every day | ORAL | 0 refills | Status: AC
  Filled 2020-10-16: qty 30, 30d supply, fill #0

## 2020-10-16 MED ORDER — CARVEDILOL 6.25 MG PO TABS
6.2500 mg | ORAL_TABLET | Freq: Two times a day (BID) | ORAL | 3 refills | Status: DC
  Filled 2020-10-16: qty 60, 30d supply, fill #0

## 2020-10-16 MED ORDER — SACUBITRIL-VALSARTAN 97-103 MG PO TABS
1.0000 | ORAL_TABLET | Freq: Two times a day (BID) | ORAL | 0 refills | Status: DC
  Filled 2020-10-16: qty 60, 30d supply, fill #0

## 2020-10-16 MED ORDER — PANTOPRAZOLE SODIUM 40 MG PO TBEC
40.0000 mg | DELAYED_RELEASE_TABLET | Freq: Every day | ORAL | 1 refills | Status: DC
  Filled 2020-10-16: qty 30, 30d supply, fill #0

## 2020-10-16 NOTE — Care Management (Signed)
1027 10-16-20 Case Manager received a referral for outpatient palliative services. Case Manager spoke with the patient and spouse regarding agency-both had no preference. Referral submitted to [facility]. Office will call the patient with visit times. No further needs from Case Manager at this time.

## 2020-10-16 NOTE — Discharge Summary (Addendum)
Physician Discharge Summary  Benjamin Cook [id number] DOB: 05-25-1945 DOA: 10/10/2020  PCP: Merryl Hacker, No  Admit date: 10/10/2020 Discharge date: 10/16/2020  Admitted From: Home Disposition:  Home  Recommendations for Outpatient Follow-up:  Follow up with EP in 1-2 weeks Please obtain BMP/CBC in one week   Home Health:no Equipment/Devices:none  Discharge Condition:Stable CODE STATUS:Full Diet recommendation: Heart Healthy   Brief/Interim Summary: 75 y.o. male past medical history of CAD status post PCI, chronic systolic heart failure status post AICD placement, paroxysmal atrial fibrillation on Xarelto comes into the ED having nausea vomiting and abdominal discomfort has been going on for the last 4 weeks in the ER he was found to be in V. tach cardiology was consulted who cardioverted the patient, CT scan of the abdomen and pelvis was unremarkable mildly elevated LFTs hemoglobin of 13 COVID test was negative was started on amiodarone bolus and infusion.   Status post left heart cath revealed 75% stenosis of left main.  Cardiology recommended given her overall condition he would not be a borderline candidate for coronary artery bypass and graft.  It was discussed with the patient he would not like to proceed with CABG.  Cardiology has elected to treat medically. Electrophysiology is also on board, recommended to continue oral amiodarone and mexiletine. Maximize antianginal and CAD therapy  Discharge Diagnoses:  Principal Problem:   V tach (Benjamin Cook) Active Problems:   CAD (coronary artery disease)   Cardiomyopathy, ischemic   Aneurysm of abdominal vessel (5.6 CM)   HFrEF (heart failure with reduced ejection fraction) ([name])   ICD (implantable cardioverter-defibrillator) in place   Essential hypertension   Paroxysmal atrial fibrillation ([name])   Goals of care, counseling/discussion   Abdominal pain   Ventricular tachycardia ([name])  A. tach with a slow ventricular rate: Cardiology was  consulted recommended to continue amiodarone and lidocaine which she was loaded up on admission. Benjamin Cook underwent a left heart cath that showed 75% stenosis of the left main cardiology relates that given his overall condition she would note be a good candidate for coronary artery bypass. Surgery recommended treat medically. He was started on mexiletine and continued on amiodarone.  Ischemic cardiomyopathy/chronic systolic heart failure: With a 2D echo last 1 showed an EF of 30% AICD in place. He was continued on Entresto Jardiance Aldactone, hydralazine and Coreg his Imdur was discontinued.  Paroxysmal atrial fibrillation: Continue beta-blockers and amiodarone he was restarted back on his Xarelto. No changes made to his medication.  History of abdominal pain: CT scan of the abdomen pelvis was done that showed no acute findings GI was consulted Who deemed her not a candidate for surgical repair or endoscopic procedures. They recommended to continue Protonix once a day and have it persist to continue to trial Bentyl as an outpatient.  Discharge Instructions  Discharge Instructions     Diet - low sodium heart healthy   Complete by: As directed    Increase activity slowly   Complete by: As directed       Allergies as of 10/16/2020   No Known Allergies      Medication List     STOP taking these medications    bismuth subsalicylate [id number] suspension Commonly known as: PEPTO BISMOL   Entresto 49-51 MG Generic drug: sacubitril-valsartan Replaced by: sacubitril-valsartan 97-103 MG   Fish Oil 1200 MG Caps   hydrALAZINE 25 MG tablet Commonly known as: APRESOLINE   isosorbide mononitrate 30 MG 24 hr tablet Commonly known as: IMDUR   PEPCID  AC PO       TAKE these medications    acetaminophen 500 MG tablet Commonly known as: TYLENOL Take 500-1,000 mg by mouth every 8 (eight) hours as needed (for pain).   amiodarone 200 MG tablet Commonly known as:  PACERONE Take 1 tablet (200 mg total) by mouth daily.   atorvastatin 80 MG tablet Commonly known as: LIPITOR TAKE 1 TABLET BY MOUTH  DAILY AT 6 PM What changed:  how much to take how to take this when to take this additional instructions   carvedilol 6.25 MG tablet Commonly known as: COREG Take 1 tablet (6.25 mg total) by mouth 2 (two) times daily with a meal. What changed:  medication strength how much to take   Dulera 100-5 MCG/ACT Aero Generic drug: mometasone-formoterol Inhale 2 puffs into the lungs in the morning and at bedtime.   feeding supplement Liqd Try to drink 1 can 1-2 times a day. What changed:  how much to take how to take this when to take this additional instructions   Fluticasone-Salmeterol 100-50 MCG/DOSE Aepb Commonly known as: ADVAIR Inhale 1 puff into the lungs 2 (two) times daily.   furosemide 40 MG tablet Commonly known as: LASIX Take 0.5 tablets (20 mg total) by mouth daily.   Jardiance 10 MG Tabs tablet Generic drug: empagliflozin Take 1 tablet (10 mg total) by mouth daily. Start taking on: October 17, 2020   melatonin 5 MG Tabs Take 1 tablet (5 mg total) by mouth at bedtime.   mexiletine 200 MG capsule Commonly known as: MEXITIL Take 1 capsule (200 mg total) by mouth every 12 (twelve) hours.   multivitamin capsule Take 1 capsule by mouth daily.   nitroGLYCERIN 0.4 MG SL tablet Commonly known as: NITROSTAT Place 1 tablet (0.4 mg total) under the tongue every 5 (five) minutes x 3 doses as needed for chest pain.   NYQUIL PO Take 1 Dose by mouth at bedtime as needed (sleep). Heart healty nyquil   pantoprazole 40 MG tablet Commonly known as: Protonix Take 1 tablet (40 mg total) by mouth daily.   potassium chloride SA 20 MEQ tablet Commonly known as: KLOR-CON TAKE 1 TABLET BY MOUTH  DAILY   rivaroxaban 20 MG Tabs tablet Commonly known as: Xarelto Take 1 tablet (20 mg total) by mouth daily with supper. What changed: when to  take this   sacubitril-valsartan 97-103 MG Commonly known as: ENTRESTO Take 1 tablet by mouth 2 (two) times daily. Replaces: Entresto 49-51 MG   spironolactone 25 MG tablet Commonly known as: ALDACTONE Take 1 tablet (25 mg total) by mouth at bedtime.        Follow-up Information     Shirley Friar, PA-C Follow up.   Specialty: Physician Assistant Why: 10/26/20 @ 9:40AM Contact information: [place] [id number] [phone]         [facility] Follow up.   Specialty: PALLIATIVE CARE Why: Outpatient Palliative Care Services-Office will call the patient with visit times. Contact information: [place] [phone]               No Known Allergies  Consultations: Cardiology Electrophysiology   Procedures/Studies: CT ABDOMEN PELVIS WO CONTRAST  Result Date: 10/14/2020 CLINICAL DATA:  Acute, non localized abdominal pain. EXAM: CT ABDOMEN AND PELVIS WITHOUT CONTRAST TECHNIQUE: Multidetector CT imaging of the abdomen and pelvis was performed following the standard protocol without IV contrast. COMPARISON:  10/10/2020 FINDINGS: Lower chest: No  significant change in a small pericardial effusion with a maximum thickness of 1.0 cm. Stable pacemaker and AICD leads. Interval minimal bilateral pleural fluid and patchy density in the left lower lobe with moderate to marked bilateral peribronchial thickening with progression. Hepatobiliary: No focal liver abnormality is seen. No gallstones, gallbladder wall thickening, or biliary dilatation. Pancreas: Unremarkable. No pancreatic ductal dilatation or surrounding inflammatory changes. Spleen: Normal in size without focal abnormality. Adrenals/Urinary Tract: A heterogeneous right adrenal mass is again demonstrated measuring 4.3 x 3.3 cm on image number 17/3. Normal appearing left adrenal gland. Stable bilateral renal cysts. Unremarkable ureters and  urinary bladder. Stomach/Bowel: Multiple descending and sigmoid colon diverticula without evidence of diverticulitis. Normal appearing appendix. Small hiatal hernia.  Unremarkable small bowel. Vascular/Lymphatic: Stable aorto bi-iliac bypass stent graft within a thrombosed infrarenal abdominal aorta with maximum dimensions of 4.7 cm on image number 37/3. Atheromatous arterial calcifications. No enlarged lymph nodes. Reproductive: Markedly enlarged prostate gland protruding into the base of the urinary bladder. Other: Small to moderate-sized left inguinal hernia containing fat and a short segment of a loop of distal small bowel without bowel obstruction. Small right inguinal hernia containing fat. Musculoskeletal: Lumbar and lower thoracic spine degenerative changes. IMPRESSION: 1. No acute abnormality. 2. Stable right adrenal mass, previously shown to be stable since 10/14/2003, compatible with a benign process. 3. Stable marked prostatic hypertrophy. 4. Small to moderate-sized left inguinal hernia containing fat and a short segment of a loop of distal small bowel without obstruction. 5. Colonic diverticulosis, small hiatal hernia and small pericardial effusion, without significant change. Electronically Signed   By: Claudie Revering M.D.   On: 10/14/2020 17:15   CT ABDOMEN PELVIS W CONTRAST  Result Date: 10/10/2020 CLINICAL DATA:  Abdominal pain, acute, nonlocalized EXAM: CT ABDOMEN AND PELVIS WITH CONTRAST TECHNIQUE: Multidetector CT imaging of the abdomen and pelvis was performed using the standard protocol following bolus administration of intravenous contrast. CONTRAST:  100 cc Omnipaque 300 COMPARISON:  04/04/2017 FINDINGS: Lower chest: There is stable left lower lobe bronchiectasis and mucoid impaction with associated parenchymal scarring and volume loss. No pleural effusion. Pacemaker leads are seen within the right heart. Left ventricular dilation is noted with stable thinning of the septal and apical wall  in keeping with prior myocardial infarction. Trace pericardial effusion. Hepatobiliary: No focal liver abnormality is seen. No gallstones, gallbladder wall thickening, or biliary dilatation. Pancreas: Unremarkable Spleen: Unremarkable Adrenals/Urinary Tract: Stable 3.3 x 4.4 cm heterogeneously enhancing right adrenal mass, stable since remote prior examination of 10/14/2003, likely representing a benign adrenal adenoma or myelolipoma. Left adrenal gland is unremarkable. The kidneys are normal in size and position. Scattered simple cortical cysts are seen within the kidneys bilaterally. The kidneys are otherwise unremarkable. The prostate gland is centrally enlarged and protrudes into the bladder lumen. The bladder is not distended. Stomach/Bowel: Moderate sigmoid diverticulosis. Unremarkable loops of small bowel are seen within small bilateral inguinal hernias. The stomach, small bowel, and large bowel are otherwise unremarkable. Appendix normal. No free intraperitoneal gas or fluid. Vascular/Lymphatic: Short segment focal dissection of the a suprarenal aorta is again identified with a patent false lumen noted posteriorly. No evidence of rupture or periaortic inflammatory change. Infrarenal abdominal aortic aneurysm has undergone endovascular repair utilizing a bifurcated stent graft demonstrating infrarenal proximal fixation and bilateral common iliac distal landing zones. Small mural thrombus is again identified within the a left common iliac limb, unchanged, without evidence of hemodynamically significant stenosis. The aneurysm sac is unchanged measuring 4.7 x 5.0  cm at axial image # 36/3. Extensive atherosclerotic calcification noted within the a mesenteric and renal vasculature, however, the degree of stenosis is not well assessed on this examination. 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 No pathologic adenopathy within the abdomen and pelvis. Reproductive: There is marked prostatic  enlargement, particularly centrally which protrudes into the bladder lumen. This appears similar to prior examination. Other: Small bilateral inguinal hernias are present. Rectum unremarkable. Musculoskeletal: Degenerative changes are seen within the lumbar spine. No lytic or blastic bone lesions are identified. IMPRESSION: No acute intra-abdominal pathology identified. No definite radiographic explanation for the patient's reported symptoms. Stable left basilar bronchiectasis with superimposed mucoid impaction and volume loss. Stable 4.4 cm right adrenal mass since remote prior examination of 10/14/2003 likely representing a benign adrenal adenoma or myelolipoma. Moderate sigmoid diverticulosis. 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 Marked prostatic enlargement without evidence of bladder outlet obstruction. Small bilateral inguinal hernias containing small bowel without evidence of obstruction. Aortic aneurysm NOS (ICD10-I71.9). Aortic Atherosclerosis (ICD10-I70.0). Electronically Signed   By: Fidela Salisbury M.D.   On: 10/10/2020 23:21   CARDIAC CATHETERIZATION  Result Date: 10/12/2020   Mid Cx lesion is 70% stenosed.   Ost LM to Dist LM lesion is 75% stenosed.   Mid RCA lesion is 70% stenosed.   Prox RCA to Mid RCA lesion is 50% stenosed.   Previously placed Mid LAD stent (unknown type) is  widely patent. Benjamin Cook is a 75 y.o. male  [id number] LOCATION:  FACILITY: [facility] PHYSICIAN: Benjamin Cook, M.D. [birth year] DATE OF PROCEDURE:  10/12/2020 DATE OF DISCHARGE: CARDIAC CATHETERIZATION History obtained from chart review.75 y.o. male with past medical history of ischemic cardiomyopathy, chronic systolic congestive heart failure, prior ventricular tachycardia, prior  ICD, paroxysmal atrial fibrillation, hypertension, hyperlipidemia, previous abdominal aortic aneurysm repair, COPD admitted with recurrent ventricular tachycardia.  He has a history of LAD stenting back in 2001.  He has LV dysfunction with a prior Myoview that showed inferoapical apical and anteroapical scar.  He was admitted with ventricular tachycardia.  He has a history of A. fib as well.  He was on amiodarone and lidocaine and converted to sinus rhythm.  Plans were to convert to mexiletine instead of lidocaine.  We were asked to perform coronary angiography to determine if there was an ischemic etiology. PROCEDURE DESCRIPTION: The patient was brought to the second floor [facility] cath lab in the postabsorptive state. He was premedicated with IV Versed and fentanyl. His right groinwas prepped and shaved in usual sterile fashion. Xylocaine 1% was used for local anesthesia. A 5 French sheath was inserted into the right common femoral  artery using standard Seldinger technique.  I initially try to access the right radial artery under ultrasound guidance unsuccessfully.  5 French right left Judkins diagnostic catheters were used for selective coronary angiography.  Catheter exchanges were performed over a 260 cm 035 J-wire.  Isovue dye was used for the entirety of the case (55 cc of contrast administered to the patient).  Retrograde aortic pressures monitored in the case.   Mr. Woolson has 75% calcified distal left main.  His previously placed mid LAD stent is widely patent.  He does have a 70% mid AV groove circumflex and RCA disease.  I do  not think he is an operative candidate.  It is certainly possible that this is contributory to his ventricular tachycardia.  Medical therapy he will be recommended.  The femoral sheath was removed and pressure was held on the groin to achieve hemostasis.  The patient left the lab in stable condition.  Dr. [name] was notified of these results. Benjamin Cook. MD,  [facility] 10/12/2020 9:41 AM    DG Chest Portable 1 View  Result Date: 10/10/2020 CLINICAL DATA:  Weakness.  Abdominal pain and vomiting. EXAM: PORTABLE CHEST 1 VIEW COMPARISON:  06/01/2020, remote CT 06/03/2007 FINDINGS: Multi lead left-sided pacemaker in place. The heart is normal in size. Aortic atherosclerosis. The lungs are hyperinflated. Central bronchial thickening. No emphysema seen on prior CT. Streaky left lung base scarring. No acute airspace disease. No pulmonary edema. No pleural effusion or pneumothorax. No visualized pulmonary mass or nodule. No acute osseous abnormalities are seen. IMPRESSION: Hyperinflation and central bronchial thickening, imaging findings suggesting COPD. No focal airspace disease. Electronically Signed   By: Keith Rake M.D.   On: 10/10/2020 21:19   ECHOCARDIOGRAM COMPLETE  Result Date: 10/11/2020    ECHOCARDIOGRAM REPORT   Patient Name:   Benjamin Cook Date of Exam: 10/11/2020 Medical Rec #:  [id number]         Height:       70.0 in Accession #:    [id number]        Weight:       138.9 lb Date of Birth:  Dec 25, 1945         BSA:          1.788 m Patient Age:    71 years          BP:           164/77 mmHg Patient Gender: M                 HR:           59 bpm. Exam Location:  Inpatient Procedure: 2D Echo, Cardiac Doppler, Color Doppler and Intracardiac            Opacification Agent Indications:    Ventricular tachycardia  History:        Patient has prior history of Echocardiogram examinations, most                 recent 04/09/2019. CHF and Cardiomyopathy, CAD, Defibrillator,                 COPD and PVD, Arrythmias:Vtach and Atrial Fibrillation,                 Signs/Symptoms:Chest Pain; Risk Factors:Former Smoker.  Sonographer:    Dustin Flock RDCS Referring Phys: [id number] Reading  1. Left ventricular ejection fraction, by estimation, is 30 to 35%. The left ventricle has moderately decreased function. The left ventricle has no regional wall motion  abnormalities. There is mild concentric left ventricular hypertrophy. Left ventricular diastolic parameters are consistent with Grade I diastolic dysfunction (impaired relaxation). Elevated left ventricular end-diastolic pressure. There is akinesis of the left ventricular, mid inferoseptal wall and anteroseptal wall. There is akinesis of the left ventricular, apical septal wall, inferior wall and apical segment. There is severe hypokinesis of the left ventricular, mid-apical anterior wall.  2. Right ventricular systolic function is normal. The right ventricular size is normal. There is normal pulmonary artery systolic pressure. The estimated right ventricular systolic pressure is 0000000 mmHg.  3. The mitral valve  is normal in structure. Trivial mitral valve regurgitation. No evidence of mitral stenosis.  4. The aortic valve is calcified. Aortic valve regurgitation is not visualized. Mild to moderate aortic valve sclerosis/calcification is present, without any evidence of aortic stenosis.  5. The inferior vena cava is normal in size with greater than 50% respiratory variability, suggesting right atrial pressure of 3 mmHg.  6. There is very sluggish flow in the apical LV which increases risk of apical LV thrombus formation. FINDINGS  Left Ventricle: Left ventricular ejection fraction, by estimation, is 30 to 35%. The left ventricle has moderately decreased function. The left ventricle has no regional wall motion abnormalities. Severe hypokinesis of the left ventricular, mid-apical anterior wall. Definity contrast agent was given IV to delineate the left ventricular endocardial borders. The left ventricular internal cavity size was normal in size. There is mild concentric left ventricular hypertrophy. Left ventricular diastolic parameters are consistent with Grade I diastolic dysfunction (impaired relaxation). Elevated left ventricular end-diastolic pressure. Right Ventricle: The right ventricular size is normal. No  increase in right ventricular wall thickness. Right ventricular systolic function is normal. There is normal pulmonary artery systolic pressure. The tricuspid regurgitant velocity is 2.67 m/s, and  with an assumed right atrial pressure of 3 mmHg, the estimated right ventricular systolic pressure is 0000000 mmHg. Left Atrium: Left atrial size was normal in size. Right Atrium: Right atrial size was normal in size. Pericardium: There is no evidence of pericardial effusion. Mitral Valve: The mitral valve is normal in structure. Trivial mitral valve regurgitation. No evidence of mitral valve stenosis. Tricuspid Valve: The tricuspid valve is normal in structure. Tricuspid valve regurgitation is trivial. No evidence of tricuspid stenosis. Aortic Valve: The aortic valve is calcified. Aortic valve regurgitation is not visualized. Mild to moderate aortic valve sclerosis/calcification is present, without any evidence of aortic stenosis. Pulmonic Valve: The pulmonic valve was normal in structure. Pulmonic valve regurgitation is not visualized. No evidence of pulmonic stenosis. Aorta: The aortic root is normal in size and structure. Venous: The inferior vena cava is normal in size with greater than 50% respiratory variability, suggesting right atrial pressure of 3 mmHg. IAS/Shunts: No atrial level shunt detected by color flow Doppler. Additional Comments: A device lead is visualized.  LEFT VENTRICLE PLAX 2D LVIDd:         5.10 cm  Diastology LVIDs:         4.50 cm  LV e' medial:    4.46 cm/s LV PW:         1.30 cm  LV E/e' medial:  14.0 LV IVS:        1.30 cm  LV e' lateral:   4.24 cm/s LVOT diam:     2.00 cm  LV E/e' lateral: 14.7 LV SV:         60 LV SV Index:   34 LVOT Area:     3.14 cm  RIGHT VENTRICLE RV Basal diam:  2.70 cm RV S prime:     10.40 cm/s TAPSE (M-mode): 2.1 cm LEFT ATRIUM             Index       RIGHT ATRIUM           Index LA diam:        3.30 cm 1.85 cm/m  RA Area:     13.80 cm LA Vol (A2C):   35.5 ml 19.86  ml/m RA Volume:   37.60 ml  21.03 ml/m LA Vol (A4C):  29.6 ml 16.56 ml/m LA Biplane Vol: 35.7 ml 19.97 ml/m  AORTIC VALVE LVOT Vmax:   101.00 cm/s LVOT Vmean:  55.900 cm/s LVOT VTI:    0.192 m  AORTA Ao Root diam: 2.90 cm MITRAL VALVE               TRICUSPID VALVE MV Area (PHT): 3.08 cm    TR Peak grad:   28.5 mmHg MV Decel Time: 246 msec    TR Vmax:        267.00 cm/s MV E velocity: 62.40 cm/s MV A velocity: 73.30 cm/s  SHUNTS MV E/A ratio:  0.85        Systemic VTI:  0.19 m                            Systemic Diam: 2.00 cm Benjamin Him MD Electronically signed by Benjamin Him MD Signature Date/Time: 10/11/2020/3:22:47 PM    Final      Subjective: No complaints  Discharge Exam: Vitals:   10/16/20 0821 10/16/20 0827  BP:  (!) 147/62  Pulse: 65 60  Resp: 18   Temp:    SpO2: 98%    Vitals:   10/15/20 2005 10/16/20 0423 10/16/20 0821 10/16/20 0827  BP: 130/60 138/66  (!) 147/62  Pulse: 60 60 65 60  Resp: '16 18 18   '$ Temp: 97.9 F (36.6 C) 98.3 F (36.8 C)    TempSrc: Oral Oral    SpO2: 97% 97% 98%   Weight:      Height:        General: Pt is alert, awake, not in acute distress Cardiovascular: RRR, S1/S2 +, no rubs, no gallops Respiratory: CTA bilaterally, no wheezing, no rhonchi Abdominal: Soft, NT, ND, bowel sounds + Extremities: no edema, no cyanosis    The results of significant diagnostics from this hospitalization (including imaging, microbiology, ancillary and laboratory) are listed below for reference.     Microbiology: Recent Results (from the past 240 hour(s))  Resp Panel by RT-PCR (Flu A&B, Covid) Nasopharyngeal Swab     Status: None   Collection Time: 10/10/20  8:56 PM   Specimen: Nasopharyngeal Swab; Nasopharyngeal(NP) swabs in vial transport medium  Result Value Ref Range Status   SARS Coronavirus 2 by RT PCR NEGATIVE NEGATIVE Final    Comment: (NOTE) SARS-CoV-2 target nucleic acids are NOT DETECTED.  The SARS-CoV-2 RNA is generally detectable in upper  respiratory specimens during the acute phase of infection. The lowest concentration of SARS-CoV-2 viral copies this assay can detect is 138 copies/mL. A negative result does not preclude SARS-Cov-2 infection and should not be used as the sole basis for treatment or other patient management decisions. A negative result may occur with  improper specimen collection/handling, submission of specimen other than nasopharyngeal swab, presence of viral mutation(s) within the areas targeted by this assay, and inadequate number of viral copies(<138 copies/mL). A negative result must be combined with clinical observations, patient history, and epidemiological information. The expected result is Negative.  Fact Sheet for Patients:  EntrepreneurPulse.com.au  Fact Sheet for Healthcare Providers:  IncredibleEmployment.be  This test is no t yet approved or cleared by the [place] FDA and  has been authorized for detection and/or diagnosis of SARS-CoV-2 by FDA under an Emergency Use Authorization (EUA). This EUA will remain  in effect (meaning this test can be used) for the duration of the COVID-19 declaration under Section 564(b)(1) of the Act, 21 U.S.C.section 360bbb-3(b)(1),  unless the authorization is terminated  or revoked sooner.       Influenza A by PCR NEGATIVE NEGATIVE Final   Influenza B by PCR NEGATIVE NEGATIVE Final    Comment: (NOTE) The Xpert Xpress SARS-CoV-2/FLU/RSV plus assay is intended as an aid in the diagnosis of influenza from Nasopharyngeal swab specimens and should not be used as a sole basis for treatment. Nasal washings and aspirates are unacceptable for Xpert Xpress SARS-CoV-2/FLU/RSV testing.  Fact Sheet for Patients: EntrepreneurPulse.com.au  Fact Sheet for Healthcare Providers: IncredibleEmployment.be  This test is not yet approved or cleared by the [place] FDA and has been  authorized for detection and/or diagnosis of SARS-CoV-2 by FDA under an Emergency Use Authorization (EUA). This EUA will remain in effect (meaning this test can be used) for the duration of the COVID-19 declaration under Section 564(b)(1) of the Act, 21 U.S.C. section 360bbb-3(b)(1), unless the authorization is terminated or revoked.  Performed at [facility], [place] [address] [place], [place] [address]   MRSA Next Gen by PCR, Nasal     Status: None   Collection Time: 10/11/20  8:31 PM   Specimen: Nasal Mucosa; Nasal Swab  Result Value Ref Range Status   MRSA by PCR Next Gen NOT DETECTED NOT DETECTED Final    Comment: (NOTE) The GeneXpert MRSA Assay (FDA approved for NASAL specimens only), is one component of a comprehensive MRSA colonization surveillance program. It is not intended to diagnose MRSA infection nor to guide or monitor treatment for MRSA infections. Test performance is not FDA approved in patients less than 77 years old. Performed at [facility], [place] [address] [place],  [address]      Labs: BNP (last 3 results) Recent Labs    06/01/20 1632  BNP [id number]*   Basic Metabolic Panel: Recent Labs  Lab 10/10/20 2023 10/11/20 1101 10/12/20 0537 10/13/20 0223 10/14/20 0217  NA 137 134* 135 134* 136  K 4.3 4.1 3.7 4.2 3.9  CL 103 102 104 101 104  CO2 '26 25 24 22 25  '$ GLUCOSE 104* 101* 114* 107* 93  BUN '13 13 9 8 '$ 7*  CREATININE 1.19 1.04 0.98 0.95 1.08  CALCIUM 9.3 9.1 8.7* 8.3* 8.6*  MG  --  1.9 1.9  --   --    Liver Function Tests: Recent Labs  Lab 10/10/20 2023 10/11/20 1101  AST 58* 49*  ALT 77* 64*  ALKPHOS 70 61  BILITOT 0.7 0.5  PROT 7.1 6.7  ALBUMIN 3.4* 3.1*   Recent Labs  Lab 10/10/20 2023  LIPASE 33   No results for input(s): AMMONIA in the last 168 hours. CBC: Recent Labs  Lab 10/10/20 2023 10/11/20 1101 10/12/20 0537 10/13/20 0223 10/14/20 0933  WBC 11.7* 9.5 13.2* 12.2* 8.1  NEUTROABS 8.5*  --   --   --   5.7  HGB 12.9* 13.6 13.9 12.9* 12.2*  HCT 39.5 39.6 40.1 37.9* 36.2*  MCV 96.3 93.2 91.3 91.8 93.5  PLT 260 232 204 192 198   Cardiac Enzymes: No results for input(s): CKTOTAL, CKMB, CKMBINDEX, TROPONINI in the last 168 hours. BNP: Invalid input(s): POCBNP CBG: Recent Labs  Lab 10/11/20 2008  GLUCAP 124*   D-Dimer No results for input(s): DDIMER in the last 72 hours. Hgb A1c No results for input(s): HGBA1C in the last 72 hours. Lipid Profile No results for input(s): CHOL, HDL, LDLCALC, TRIG, CHOLHDL, LDLDIRECT in the last 72 hours. Thyroid function studies No results for  input(s): TSH, T4TOTAL, T3FREE, THYROIDAB in the last 72 hours.  Invalid input(s): FREET3 Anemia work up No results for input(s): VITAMINB12, FOLATE, FERRITIN, TIBC, IRON, RETICCTPCT in the last 72 hours. Urinalysis    Component Value Date/Time   COLORURINE YELLOW 10/11/2020 0314   APPEARANCEUR CLEAR 10/11/2020 0314   LABSPEC >1.046 (H) 10/11/2020 0314   PHURINE 5.0 10/11/2020 0314   GLUCOSEU NEGATIVE 10/11/2020 0314   GLUCOSEU NEGATIVE 12/09/2016 1100   HGBUR NEGATIVE 10/11/2020 0314   BILIRUBINUR NEGATIVE 10/11/2020 0314   KETONESUR NEGATIVE 10/11/2020 0314   PROTEINUR NEGATIVE 10/11/2020 0314   UROBILINOGEN 0.2 12/09/2016 1100   NITRITE NEGATIVE 10/11/2020 0314   LEUKOCYTESUR NEGATIVE 10/11/2020 0314   Sepsis Labs Invalid input(s): PROCALCITONIN,  WBC,  LACTICIDVEN Microbiology Recent Results (from the past 240 hour(s))  Resp Panel by RT-PCR (Flu A&B, Covid) Nasopharyngeal Swab     Status: None   Collection Time: 10/10/20  8:56 PM   Specimen: Nasopharyngeal Swab; Nasopharyngeal(NP) swabs in vial transport medium  Result Value Ref Range Status   SARS Coronavirus 2 by RT PCR NEGATIVE NEGATIVE Final    Comment: (NOTE) SARS-CoV-2 target nucleic acids are NOT DETECTED.  The SARS-CoV-2 RNA is generally detectable in upper respiratory specimens during the acute phase of infection. The  lowest concentration of SARS-CoV-2 viral copies this assay can detect is 138 copies/mL. A negative result does not preclude SARS-Cov-2 infection and should not be used as the sole basis for treatment or other patient management decisions. A negative result may occur with  improper specimen collection/handling, submission of specimen other than nasopharyngeal swab, presence of viral mutation(s) within the areas targeted by this assay, and inadequate number of viral copies(<138 copies/mL). A negative result must be combined with clinical observations, patient history, and epidemiological information. The expected result is Negative.  Fact Sheet for Patients:  EntrepreneurPulse.com.au  Fact Sheet for Healthcare Providers:  IncredibleEmployment.be  This test is no t yet approved or cleared by the [place] FDA and  has been authorized for detection and/or diagnosis of SARS-CoV-2 by FDA under an Emergency Use Authorization (EUA). This EUA will remain  in effect (meaning this test can be used) for the duration of the COVID-19 declaration under Section 564(b)(1) of the Act, 21 U.S.C.section 360bbb-3(b)(1), unless the authorization is terminated  or revoked sooner.       Influenza A by PCR NEGATIVE NEGATIVE Final   Influenza B by PCR NEGATIVE NEGATIVE Final    Comment: (NOTE) The Xpert Xpress SARS-CoV-2/FLU/RSV plus assay is intended as an aid in the diagnosis of influenza from Nasopharyngeal swab specimens and should not be used as a sole basis for treatment. Nasal washings and aspirates are unacceptable for Xpert Xpress SARS-CoV-2/FLU/RSV testing.  Fact Sheet for Patients: EntrepreneurPulse.com.au  Fact Sheet for Healthcare Providers: IncredibleEmployment.be  This test is not yet approved or cleared by the [place] FDA and has been authorized for detection and/or diagnosis of SARS-CoV-2 by FDA under  an Emergency Use Authorization (EUA). This EUA will remain in effect (meaning this test can be used) for the duration of the COVID-19 declaration under Section 564(b)(1) of the Act, 21 U.S.C. section 360bbb-3(b)(1), unless the authorization is terminated or revoked.  Performed at [facility], [place] [address] [place], [place] [address]   MRSA Next Gen by PCR, Nasal     Status: None   Collection Time: 10/11/20  8:31 PM   Specimen: Nasal Mucosa; Nasal Swab  Result Value Ref Range Status  MRSA by PCR Next Gen NOT DETECTED NOT DETECTED Final    Comment: (NOTE) The GeneXpert MRSA Assay (FDA approved for NASAL specimens only), is one component of a comprehensive MRSA colonization surveillance program. It is not intended to diagnose MRSA infection nor to guide or monitor treatment for MRSA infections. Test performance is not FDA approved in patients less than 11 years old. Performed at [facility], [place] [address], [place], [place] [address]      Time coordinating discharge: Over 30 minutes  SIGNED:   Charlynne Cousins, MD  [facility] 10/16/2020, 1:40 PM Pager   If 7PM-7AM, please contact night-coverage [email or web address] Password [email or web address]

## 2020-10-16 NOTE — Progress Notes (Signed)
[facility] ([facility])  Hospital Liaison: RN note         This patient has been referred to our palliative care services in the community.  [facility] will continue to follow for any discharge planning needs and to coordinate continuation of palliative care in the outpatient setting.    If you have questions or need assistance, please call [phone] or contact the hospital Liaison listed on AMION.      Thank you for this referral.         Mary Anne Robertson, RN, CCM  [facility] Hospital Liaison   [phone] 

## 2020-10-16 NOTE — Progress Notes (Signed)
Mobility Specialist Progress Note    10/16/20 1131  Mobility  Activity Ambulated in hall  Level of Assistance Independent  Assistive Device None  Distance Ambulated (ft) 164 ft  Mobility Ambulated independently in hallway  Mobility Response Tolerated well  Mobility performed by Mobility specialist  $Mobility charge 1 Mobility   Pt received preparing to go home but agreed to ambulate before changing. Asx throughout walk and returned to room with wife present.   Hildred Alamin Mobility Specialist  Mobility Specialist Phone: [phone]

## 2020-10-16 NOTE — Progress Notes (Signed)
Telemetry reviewed SR, often A pacing No VT  Device programming changes this admission 1st VT zone rate reduced to 120bpm NID 100 and with 10 spins of ATP prior to shock  No changes from EP We will remains available if needed EP follow up will be arranged  Tommye Standard, PA-C

## 2020-10-16 NOTE — Progress Notes (Signed)
Progress Note  Patient Name: Benjamin Cook Date of Encounter: 10/16/2020  [facility] Cardiologist: Kirk Ruths, MD   Subjective   No palpitations, dyspnea or dizziness  Abdominal pain better   Inpatient Medications    Scheduled Meds:  amiodarone  400 mg Oral BID   atorvastatin  80 mg Oral QPM   carvedilol  6.25 mg Oral BID WC   empagliflozin  10 mg Oral Daily   mouth rinse  15 mL Mouth Rinse BID   melatonin  5 mg Oral QHS   mexiletine  200 mg Oral Q12H   mometasone-formoterol  2 puff Inhalation BID   omega-3 acid ethyl esters  1 g Oral Daily   pantoprazole (PROTONIX) IV  40 mg Intravenous Q12H   rivaroxaban  20 mg Oral Q supper   sacubitril-valsartan  1 tablet Oral BID   sodium chloride flush  3 mL Intravenous Q12H   spironolactone  12.5 mg Oral Daily   Continuous Infusions:  sodium chloride     PRN Meds: sodium chloride, acetaminophen, feeding supplement, morphine injection, nitroGLYCERIN, ondansetron (ZOFRAN) IV, sodium chloride flush   Vital Signs    Vitals:   10/15/20 1350 10/15/20 1952 10/15/20 2005 10/16/20 0423  BP: (!) 122/58  130/60 138/66  Pulse: 60  60 60  Resp: '16  16 18  '$ Temp: 98.1 F (36.7 C)  97.9 F (36.6 C) 98.3 F (36.8 C)  TempSrc: Oral  Oral Oral  SpO2: 92% 93% 97% 97%  Weight:      Height:        Intake/Output Summary (Last 24 hours) at 10/16/2020 0756 Last data filed at 10/15/2020 2100 Gross per 24 hour  Intake 240 ml  Output --  Net 240 ml   Last 3 Weights 10/13/2020 10/12/2020 10/10/2020  Weight (lbs) 137 lb 2 oz 138 lb 3.7 oz 138 lb 14.2 oz  Weight (kg) 62.2 kg 62.7 kg 63 kg      Telemetry    Sinus rhythm with ventricular tachycardia yesterday.- Personally Reviewed   Physical Exam   Affect appropriate Healthy:  appears stated age 89: normal Neck supple with no adenopathy JVP normal no bruits no thyromegaly Lungs clear with no wheezing and good diaphragmatic motion Heart:  S1/S2 no murmur, no rub, gallop or  click AICD under left clavicle  Abdomen: benighn, BS positve, no tenderness, no AAA no bruit.  No HSM or HJR Distal pulses intact with no bruits No edema Neuro non-focal Skin warm and dry No muscular weakness   Labs     Chemistry Recent Labs  Lab 10/10/20 2023 10/11/20 1101 10/12/20 0537 10/13/20 0223 10/14/20 0217  NA 137 134* 135 134* 136  K 4.3 4.1 3.7 4.2 3.9  CL 103 102 104 101 104  CO2 '26 25 24 22 25  '$ GLUCOSE 104* 101* 114* 107* 93  BUN '13 13 9 8 '$ 7*  CREATININE 1.19 1.04 0.98 0.95 1.08  CALCIUM 9.3 9.1 8.7* 8.3* 8.6*  PROT 7.1 6.7  --   --   --   ALBUMIN 3.4* 3.1*  --   --   --   AST 58* 49*  --   --   --   ALT 77* 64*  --   --   --   ALKPHOS 70 61  --   --   --   BILITOT 0.7 0.5  --   --   --   GFRNONAA >60 >60 >60 >60 >60  ANIONGAP 8 7 7  11 7     Hematology Recent Labs  Lab 10/12/20 0537 10/13/20 0223 10/14/20 0933  WBC 13.2* 12.2* 8.1  RBC 4.39 4.13* 3.87*  HGB 13.9 12.9* 12.2*  HCT 40.1 37.9* 36.2*  MCV 91.3 91.8 93.5  MCH 31.7 31.2 31.5  MCHC 34.7 34.0 33.7  RDW 14.9 14.9 15.4  PLT 204 192 198     Radiology    CT ABDOMEN PELVIS WO CONTRAST  Result Date: 10/14/2020 CLINICAL DATA:  Acute, non localized abdominal pain. EXAM: CT ABDOMEN AND PELVIS WITHOUT CONTRAST TECHNIQUE: Multidetector CT imaging of the abdomen and pelvis was performed following the standard protocol without IV contrast. COMPARISON:  10/10/2020 FINDINGS: Lower chest: No significant change in a small pericardial effusion with a maximum thickness of 1.0 cm. Stable pacemaker and AICD leads. Interval minimal bilateral pleural fluid and patchy density in the left lower lobe with moderate to marked bilateral peribronchial thickening with progression. Hepatobiliary: No focal liver abnormality is seen. No gallstones, gallbladder wall thickening, or biliary dilatation. Pancreas: Unremarkable. No pancreatic ductal dilatation or surrounding inflammatory changes. Spleen: Normal in size  without focal abnormality. Adrenals/Urinary Tract: A heterogeneous right adrenal mass is again demonstrated measuring 4.3 x 3.3 cm on image number 17/3. Normal appearing left adrenal gland. Stable bilateral renal cysts. Unremarkable ureters and urinary bladder. Stomach/Bowel: Multiple descending and sigmoid colon diverticula without evidence of diverticulitis. Normal appearing appendix. Small hiatal hernia.  Unremarkable small bowel. Vascular/Lymphatic: Stable aorto bi-iliac bypass stent graft within a thrombosed infrarenal abdominal aorta with maximum dimensions of 4.7 cm on image number 37/3. Atheromatous arterial calcifications. No enlarged lymph nodes. Reproductive: Markedly enlarged prostate gland protruding into the base of the urinary bladder. Other: Small to moderate-sized left inguinal hernia containing fat and a short segment of a loop of distal small bowel without bowel obstruction. Small right inguinal hernia containing fat. Musculoskeletal: Lumbar and lower thoracic spine degenerative changes. IMPRESSION: 1. No acute abnormality. 2. Stable right adrenal mass, previously shown to be stable since 10/14/2003, compatible with a benign process. 3. Stable marked prostatic hypertrophy. 4. Small to moderate-sized left inguinal hernia containing fat and a short segment of a loop of distal small bowel without obstruction. 5. Colonic diverticulosis, small hiatal hernia and small pericardial effusion, without significant change. Electronically Signed   By: Claudie Revering M.D.   On: 10/14/2020 17:15    Patient Profile     75 y.o. male with past medical history of ischemic cardiomyopathy, chronic systolic congestive heart failure, prior ventricular tachycardia, prior ICD, paroxysmal atrial fibrillation, hypertension, hyperlipidemia, previous abdominal aortic aneurysm repair, COPD admitted with recurrent ventricular tachycardia.    Assessment & Plan    1 ventricular tachycardia-patient had recurrent ventricular  tachycardia AICD lower first zone rate reduced to 120 bpm by EP continue amiodarone and mexilitene stable    2 ischemic cardiomyopathy-echocardiogram shows ejection fraction 30 to 35%.  ICD in place.  Continue Entresto and carvedilol.  We will titrate carvedilol as tolerated by blood pressure as an outpatient.  Continue Jardiance and spironolactone.  I will not resume hydralazine/nitrates unless blood pressure remains high following titration of carvedilol and Entresto.     3 chronic systolic congestive heart failure-euvolemic on examination.  We will continue to follow.   4 history of paroxysmal atrial fibrillation-patient is in sinus rhythm.  Continue beta-blocker, amiodarone and apixaban.   5 coronary artery disease-continue statin.  Note cardiac catheterization revealed 75% left main.  Given his overall medical condition he would be a borderline candidate  for coronary artery bypass and graft.  He also states that he would not consider CABG.  We will treat medically.  6 abdominal pain-patient had recurrent abdominal pain yesterday evening.  Abdominal CT unrevealing. Seen by Dr Lyndel Safe GI not a candidate for inguinal hernia repair Ok to use Bentyl for colonic spasm and protonix   Ok to d/c from cardiology perspective  Will arrange outpatient f/u with DR Allred for his AICD  For questions or updates, please contact [facility] Please consult [email or web address] for contact info under        Signed, Jenkins Rouge, MD  10/16/2020, 7:56 AM   Patient ID: [name], male   DOB: 07/25/1945, 75 y.o.   MRN: [id number]

## 2020-10-17 ENCOUNTER — Telehealth: Payer: Self-pay

## 2020-10-17 ENCOUNTER — Other Ambulatory Visit (HOSPITAL_COMMUNITY): Payer: Self-pay

## 2020-10-17 ENCOUNTER — Telehealth: Payer: Self-pay | Admitting: Cardiology

## 2020-10-17 DIAGNOSIS — I255 Ischemic cardiomyopathy: Secondary | ICD-10-CM

## 2020-10-17 NOTE — Telephone Encounter (Signed)
Benjamin Cook, Civil Service fast streamer with [facility] called. She states the patient was discharged form [facility] under [place] and she just wanted to know if it is OK with Dr. [name] for the company to provide those services.

## 2020-10-17 NOTE — Telephone Encounter (Signed)
Called to the office, and gave them verbal per Dr.Crenshaw for orders.  They verbalized understanding.

## 2020-10-17 NOTE — Telephone Encounter (Signed)
Erroneous encounter

## 2020-10-18 ENCOUNTER — Telehealth: Payer: Self-pay | Admitting: Nurse Practitioner

## 2020-10-18 NOTE — Telephone Encounter (Signed)
Spoke with patient's wife, Althene, regarding the Palliative referral/services, all questions were answered and she was in agreement with beginning services with [place].  I have scheduled an In-home Consult for 11/09/20 @ 9 AM

## 2020-10-20 ENCOUNTER — Encounter: Payer: Medicare (Managed Care) | Admitting: Student

## 2020-10-23 ENCOUNTER — Other Ambulatory Visit (HOSPITAL_COMMUNITY): Payer: Self-pay

## 2020-10-23 ENCOUNTER — Telehealth: Payer: Self-pay

## 2020-10-23 NOTE — Telephone Encounter (Signed)
Ongoing Merlin alert for sustained VT falling in to the VT-1 zone with successful ATP therapy. 9/14 12 sustained VT events.  ATPx6, ATPx5, ATPx3, ATPx1, ATPx5, ATPx1, ATPx2, ATPx1, ATPx6, ATPx1, ATPx1, ATPx7 9/17 2 sustained VT events.  ATPx3, ATPx1 9/18 7 sustained VT events.  ATPx8, ATPx1, ATPx1, ATPx6, ATPx6, ATPx3, ATPx6, and 1 NSVT. 9/12 EPIC reflects request for palliative orders.  Amioadraone, Coreg prescribed Route to triage  Successful telephone call to patient's wife (on [place]). Patient is currently resting. Wife states patient indicated he did not feel well earlier however was feeling ok prior to laying down for a nap. States did not feel well yesterday but no specific complaints. During review of medications, confirmed patient continues to take Entresto 49-51 instead of increased dosage of 97-103 as indicated on D/C AVS. Of note patient has hospital follow up with A. [place], [place] 10/26/20. Confirmed appointment wife will bring all medications to this appointment. Of note patient has palliative care initial visit 11/09/20 to discuss goals of care. [place] driving restricitons applicable however patient is not driving at this time. Will route to A. Tillery, [place] for review prior to appointment.

## 2020-10-23 NOTE — Telephone Encounter (Signed)
Unsuccessful telephone encounter to patient/wife to discuss recommended medication changes per A, Tillery, PA. [employer] compliant VM message left on wife cell requesting call back.

## 2020-10-24 ENCOUNTER — Other Ambulatory Visit: Payer: Self-pay

## 2020-10-24 MED ORDER — ISOSORBIDE MONONITRATE ER 30 MG PO TB24: 15.0000 mg | ORAL_TABLET | Freq: Every day | ORAL | 3 refills | Status: DC

## 2020-10-24 MED ORDER — AMIODARONE HCL 200 MG PO TABS: ORAL_TABLET | ORAL | 1 refills | Status: DC

## 2020-10-24 NOTE — Telephone Encounter (Addendum)
Successful telephone encounter to patient and wife to discuss medication changes as per A. Tillery, PA. Wife and patient are instructed to increase amiodarone to 400mg  BID x 1 week and then reduce to 200mg  BID. After confirming patient's BP appropriate for imdur (145/60 this am) they are instructed to resume IMDUR 15mg  (1/2 30 mg tab) daily. Scripts sent to pharmacy of choice. Patient will follow up for in-clinic appointment with A. [place], [place] 10/26/20. Patient is instructed to bring ALL medications with him to appointment. Patient and wife appreciative of call.

## 2020-10-25 ENCOUNTER — Emergency Department (HOSPITAL_COMMUNITY): Payer: Medicare (Managed Care)

## 2020-10-25 ENCOUNTER — Ambulatory Visit (INDEPENDENT_AMBULATORY_CARE_PROVIDER_SITE_OTHER): Payer: Medicare (Managed Care)

## 2020-10-25 ENCOUNTER — Other Ambulatory Visit: Payer: Self-pay

## 2020-10-25 ENCOUNTER — Inpatient Hospital Stay (HOSPITAL_COMMUNITY)
Admission: EM | Admit: 2020-10-25 | Discharge: 2020-10-28 | DRG: 274 | Disposition: A | Discharge status: Home / Self Care | Payer: Medicare (Managed Care) | Attending: Cardiology | Admitting: Cardiology

## 2020-10-25 DIAGNOSIS — I110 Hypertensive heart disease with heart failure: Secondary | ICD-10-CM | POA: Diagnosis present

## 2020-10-25 DIAGNOSIS — Z79899 Other long term (current) drug therapy: Secondary | ICD-10-CM

## 2020-10-25 DIAGNOSIS — Z8679 Personal history of other diseases of the circulatory system: Secondary | ICD-10-CM

## 2020-10-25 DIAGNOSIS — I255 Ischemic cardiomyopathy: Secondary | ICD-10-CM | POA: Diagnosis not present

## 2020-10-25 DIAGNOSIS — Z7901 Long term (current) use of anticoagulants: Secondary | ICD-10-CM | POA: Diagnosis not present

## 2020-10-25 DIAGNOSIS — E785 Hyperlipidemia, unspecified: Secondary | ICD-10-CM | POA: Diagnosis present

## 2020-10-25 DIAGNOSIS — Z955 Presence of coronary angioplasty implant and graft: Secondary | ICD-10-CM | POA: Diagnosis not present

## 2020-10-25 DIAGNOSIS — Z20822 Contact with and (suspected) exposure to covid-19: Secondary | ICD-10-CM | POA: Diagnosis present

## 2020-10-25 DIAGNOSIS — I5022 Chronic systolic (congestive) heart failure: Secondary | ICD-10-CM | POA: Diagnosis present

## 2020-10-25 DIAGNOSIS — I472 Ventricular tachycardia, unspecified: Secondary | ICD-10-CM

## 2020-10-25 DIAGNOSIS — Z4502 Encounter for adjustment and management of automatic implantable cardiac defibrillator: Secondary | ICD-10-CM

## 2020-10-25 DIAGNOSIS — I714 Abdominal aortic aneurysm, without rupture, unspecified: Secondary | ICD-10-CM | POA: Diagnosis present

## 2020-10-25 DIAGNOSIS — Z95810 Presence of automatic (implantable) cardiac defibrillator: Secondary | ICD-10-CM | POA: Diagnosis not present

## 2020-10-25 DIAGNOSIS — F17290 Nicotine dependence, other tobacco product, uncomplicated: Secondary | ICD-10-CM | POA: Diagnosis present

## 2020-10-25 DIAGNOSIS — I739 Peripheral vascular disease, unspecified: Secondary | ICD-10-CM | POA: Diagnosis present

## 2020-10-25 DIAGNOSIS — J449 Chronic obstructive pulmonary disease, unspecified: Secondary | ICD-10-CM | POA: Diagnosis present

## 2020-10-25 DIAGNOSIS — I480 Paroxysmal atrial fibrillation: Secondary | ICD-10-CM | POA: Diagnosis present

## 2020-10-25 DIAGNOSIS — I252 Old myocardial infarction: Secondary | ICD-10-CM | POA: Diagnosis not present

## 2020-10-25 DIAGNOSIS — I5020 Unspecified systolic (congestive) heart failure: Secondary | ICD-10-CM | POA: Diagnosis present

## 2020-10-25 DIAGNOSIS — I2510 Atherosclerotic heart disease of native coronary artery without angina pectoris: Secondary | ICD-10-CM | POA: Diagnosis present

## 2020-10-25 DIAGNOSIS — I5023 Acute on chronic systolic (congestive) heart failure: Secondary | ICD-10-CM | POA: Diagnosis present

## 2020-10-25 LAB — BASIC METABOLIC PANEL
Anion gap: 9 (ref 5–15)
BUN: 9 mg/dL (ref 8–23)
CO2: 22 mmol/L (ref 22–32)
Calcium: 8.6 mg/dL — ABNORMAL LOW (ref 8.9–10.3)
Chloride: 106 mmol/L (ref 98–111)
Creatinine, Ser: 1.22 mg/dL (ref 0.61–1.24)
GFR, Estimated: >60 mL/min (ref >60)
Glucose, Bld: 103 mg/dL — ABNORMAL HIGH (ref 70–99)
Potassium: 3.7 mmol/L (ref 3.5–5.1)
Sodium: 137 mmol/L (ref 135–145)

## 2020-10-25 LAB — CBC
HCT: 41.8 % (ref 39.0–52.0)
Hemoglobin: 14.2 g/dL (ref 13.0–17.0)
MCH: 31.8 pg (ref 26.0–34.0)
MCHC: 34 g/dL (ref 30.0–36.0)
MCV: 93.7 fL (ref 80.0–100.0)
Platelets: 286 10*3/uL (ref 150–400)
RBC: 4.46 MIL/uL (ref 4.22–5.81)
RDW: 15.3 % (ref 11.5–15.5)
WBC: 10.6 10*3/uL — ABNORMAL HIGH (ref 4.0–10.5)
nRBC: 0 % (ref 0.0–0.2)

## 2020-10-25 LAB — RESP PANEL BY RT-PCR (FLU A&B, COVID) ARPGX2
Influenza A by PCR: NEGATIVE
Influenza B by PCR: NEGATIVE
SARS Coronavirus 2 by RT PCR: NEGATIVE

## 2020-10-25 LAB — MAGNESIUM: Magnesium: 2.7 mg/dL — ABNORMAL HIGH (ref 1.7–2.4)

## 2020-10-25 LAB — TROPONIN I (HIGH SENSITIVITY)
Troponin I (High Sensitivity): 19 ng/L — ABNORMAL HIGH (ref <18)
Troponin I (High Sensitivity): 22 ng/L — ABNORMAL HIGH (ref <18)

## 2020-10-25 MED ORDER — MEXILETINE HCL 150 MG PO CAPS
300.0000 mg | ORAL_CAPSULE | Freq: Two times a day (BID) | ORAL | Status: DC
  Administered 2020-10-25 – 2020-10-28 (×6): 300 mg via ORAL
  Filled 2020-10-25 (×9): qty 2

## 2020-10-25 MED ORDER — AMIODARONE LOAD VIA INFUSION
150.0000 mg | Freq: Once | INTRAVENOUS | Status: AC
  Administered 2020-10-25: 150 mg via INTRAVENOUS

## 2020-10-25 MED ORDER — SPIRONOLACTONE 25 MG PO TABS
25.0000 mg | ORAL_TABLET | Freq: Every day | ORAL | Status: DC
  Administered 2020-10-25 – 2020-10-27 (×3): 25 mg via ORAL
  Filled 2020-10-25 (×3): qty 1

## 2020-10-25 MED ORDER — MOMETASONE FURO-FORMOTEROL FUM 100-5 MCG/ACT IN AERO
2.0000 | INHALATION_SPRAY | Freq: Two times a day (BID) | RESPIRATORY_TRACT | Status: DC
  Administered 2020-10-25 – 2020-10-28 (×6): 2 via RESPIRATORY_TRACT
  Filled 2020-10-25: qty 8.8

## 2020-10-25 MED ORDER — FUROSEMIDE 20 MG PO TABS
20.0000 mg | ORAL_TABLET | Freq: Every day | ORAL | Status: DC
  Administered 2020-10-25 – 2020-10-28 (×3): 20 mg via ORAL
  Filled 2020-10-25 (×3): qty 1

## 2020-10-25 MED ORDER — AMIODARONE HCL IN DEXTROSE 360-4.14 MG/200ML-% IV SOLN
60.0000 mg/h | INTRAVENOUS | Status: DC
  Administered 2020-10-25 (×2): 60 mg/h via INTRAVENOUS
  Filled 2020-10-25 (×3): qty 200

## 2020-10-25 MED ORDER — CHLORHEXIDINE GLUCONATE CLOTH 2 % EX PADS
6.0000 | MEDICATED_PAD | Freq: Every day | CUTANEOUS | Status: DC
  Administered 2020-10-26 – 2020-10-28 (×2): 6 via TOPICAL

## 2020-10-25 MED ORDER — POTASSIUM CHLORIDE CRYS ER 20 MEQ PO TBCR
40.0000 meq | EXTENDED_RELEASE_TABLET | Freq: Once | ORAL | Status: AC
  Administered 2020-10-25: 40 meq via ORAL
  Filled 2020-10-25: qty 2

## 2020-10-25 MED ORDER — EMPAGLIFLOZIN 10 MG PO TABS
10.0000 mg | ORAL_TABLET | Freq: Every day | ORAL | Status: DC
  Administered 2020-10-25 – 2020-10-28 (×4): 10 mg via ORAL
  Filled 2020-10-25 (×4): qty 1

## 2020-10-25 MED ORDER — ORAL CARE MOUTH RINSE
15.0000 mL | Freq: Two times a day (BID) | OROMUCOSAL | Status: DC
  Administered 2020-10-25 – 2020-10-28 (×3): 15 mL via OROMUCOSAL

## 2020-10-25 MED ORDER — MAGNESIUM SULFATE 2 GM/50ML IV SOLN
2.0000 g | Freq: Once | INTRAVENOUS | Status: AC
  Administered 2020-10-25: 2 g via INTRAVENOUS
  Filled 2020-10-25: qty 50

## 2020-10-25 MED ORDER — POTASSIUM CHLORIDE CRYS ER 20 MEQ PO TBCR
20.0000 meq | EXTENDED_RELEASE_TABLET | Freq: Every day | ORAL | Status: DC
  Administered 2020-10-26 – 2020-10-28 (×3): 20 meq via ORAL
  Filled 2020-10-25 (×3): qty 1

## 2020-10-25 MED ORDER — AMIODARONE IV BOLUS ONLY 150 MG/100ML: 150.0000 mg | INTRAVENOUS | Status: DC

## 2020-10-25 MED ORDER — ACETAMINOPHEN 325 MG PO TABS: 650.0000 mg | ORAL_TABLET | ORAL | Status: DC | PRN

## 2020-10-25 MED ORDER — ISOSORBIDE MONONITRATE ER 30 MG PO TB24
15.0000 mg | ORAL_TABLET | Freq: Every day | ORAL | Status: DC
  Administered 2020-10-25 – 2020-10-28 (×4): 15 mg via ORAL
  Filled 2020-10-25 (×4): qty 1

## 2020-10-25 MED ORDER — ATORVASTATIN CALCIUM 80 MG PO TABS
80.0000 mg | ORAL_TABLET | Freq: Every evening | ORAL | Status: DC
  Administered 2020-10-25 – 2020-10-27 (×3): 80 mg via ORAL
  Filled 2020-10-25 (×3): qty 1

## 2020-10-25 MED ORDER — AMIODARONE HCL IN DEXTROSE 360-4.14 MG/200ML-% IV SOLN
30.0000 mg/h | INTRAVENOUS | Status: DC
  Administered 2020-10-25 – 2020-10-26 (×2): 30 mg/h via INTRAVENOUS
  Filled 2020-10-25 (×2): qty 200

## 2020-10-25 MED ORDER — PANTOPRAZOLE SODIUM 40 MG PO TBEC
40.0000 mg | DELAYED_RELEASE_TABLET | Freq: Every day | ORAL | Status: DC
  Administered 2020-10-25 – 2020-10-28 (×4): 40 mg via ORAL
  Filled 2020-10-25 (×4): qty 1

## 2020-10-25 MED ORDER — FLUTICASONE FUROATE-VILANTEROL 100-25 MCG/INH IN AEPB: 1.0000 | INHALATION_SPRAY | Freq: Every day | RESPIRATORY_TRACT | Status: DC

## 2020-10-25 MED ORDER — NITROGLYCERIN 0.4 MG SL SUBL: 0.4000 mg | SUBLINGUAL_TABLET | SUBLINGUAL | Status: DC | PRN

## 2020-10-25 MED ORDER — CARVEDILOL 6.25 MG PO TABS
6.2500 mg | ORAL_TABLET | Freq: Two times a day (BID) | ORAL | Status: DC
  Administered 2020-10-25 – 2020-10-28 (×5): 6.25 mg via ORAL
  Filled 2020-10-25 (×6): qty 1

## 2020-10-25 MED ORDER — RIVAROXABAN 15 MG PO TABS
15.0000 mg | ORAL_TABLET | Freq: Every day | ORAL | Status: DC
  Administered 2020-10-25: 15 mg via ORAL
  Filled 2020-10-25: qty 1

## 2020-10-25 MED ORDER — MELATONIN 5 MG PO TABS
5.0000 mg | ORAL_TABLET | Freq: Every day | ORAL | Status: DC
  Administered 2020-10-25 – 2020-10-27 (×2): 5 mg via ORAL
  Filled 2020-10-25 (×3): qty 1

## 2020-10-25 MED ORDER — POTASSIUM CHLORIDE CRYS ER 20 MEQ PO TBCR
20.0000 meq | EXTENDED_RELEASE_TABLET | Freq: Every day | ORAL | Status: DC
  Filled 2020-10-25: qty 1

## 2020-10-25 NOTE — Progress Notes (Signed)
VT on monitor, rate 130s, AICD performed ATP x 3, converted to paced/Sinus rhythm. BP stable and pt alert throughout but endorsed slight lightheadedness. Zoll at bedside, pads on. Amio gtt continues, on mexiletine po. Educated pt on events and provided reassurance.

## 2020-10-25 NOTE — Plan of Care (Signed)

## 2020-10-25 NOTE — H&P (Addendum)
Cardiology Consultation:   Patient ID: KIRON OSMUN MRN: [id number]; DOB: [birth year]  Admit date: 10/25/2020 Date of Consult: 10/25/2020  PCP:  Kathyrn Lass   [facility] Providers Cardiologist:  Kirk Ruths, MD  EP: Dr. Rayann Heman   Patient Profile:   Benjamin Cook is a 75 y.o. male with a hx of CAD (s/p PCI to LAD 2001), ICM, chronic CHF (systolic), ICD, HTN, HLD, VT, AFib, AAA (s/p endovascular repair, Dr. Donzetta Matters), COPD, VT who is being seen 10/25/2020 for the evaluation of VT at the request of Dr. Vanita Panda.    Device information SJM dual chamber ICD,  Implanted 2012, gen change 03/18/2014 Has an abandoned RV lead (fracture), new lead placed 2018 Known A lead noise   04/21/2019 @ 9:31PM VT episode This was a true MMVT, 203bpm, one ATP delivered failed, , one HV therapy, 25J with clean break ATP therapies were added 10/30/19: VT w/HV tx (pt preferred to hold off AAD), stress with only peri-infarct ischemia, not felt to need cath   06/01/20: VT storm 74 ATP  Therapies and 14 HV therapies All appropriate All were MMVT episodes, but did have two different VT's Largely ATP failed though a couple did work Also a couple ATPs that slowed his VT to below detection and labeled return to sinus AMIODARONE started Additional VT zone added 166bpm (With no anginal symptoms and recent stable stress test cath not pursued  10/11/20: recurrent VT (rates 130's) under detection Amiodarone IV >> reloaded PO ADDED mexiletine 1st VT zone rate reduced to 120bpm NID 100 and with 10 spins of ATP prior to shock  History of Present Illness:   Benjamin Cook was here recently with initially abdominal complaints that he connected to his hydralazine, while in the ER had VT during this admission mexiletine was added and his devic reprogrammed to address slower VT rates, he was also tx with amio gtt and back to PO amiodarone.  LHC noted CAD though net felt to be a CABG candidate and pt did not want to  pursue CABG either, planned to treat his CAD medically.  For his belly discomfort CT scan of the abdomen pelvis was done that showed no acute findings GI was consulted Who deemed the patient not a candidate for surgical repair or endoscopic procedures. They recommended to continue Protonix once a day and have it persist to continue to trial Bentyl as an outpatient. Discharged 10/16/20  He returns today after getting ICD shock at home Cardiology called to bedside for VT, Dr. Irish Lack saw the patient, no reports of CP or supine symptoms with the VT, he was observed to have several ATP therapies that finally converted the patient to SR, ordered amio bolus/gtt, the patient has gotten 2g mag EP was asked to see and admit. Appreciate Dr. Hassell Done help, he reviewed cath films, no great targets for intervention, by his review, , LM is calcific and tortuous intothe circ.   LABS are all pending, though pt without c/o CP, not suspicious of active ischemic event  LABS Pending to be drawn  Pt reports since his discharge he has felt particularly emotional, and of late anything that gets him upset he feels dizzy and weak.  He does not have CP He has not fainted that he knows of Yesterday he was called by the office noting several ATPs and instructed to increase his amiodarone and his Imdur resumed, though with ongoing dizzy spells he came in     [place] [facility]  Partial report  only immediately available Battery and  lead measurements are ok 54 VT episodes 54 ATPs One HV therapy   Past Medical History:  Diagnosis Date   Adrenal mass ([place])    per pt this is remote (10 years) and benign by biopsy   AICD (automatic cardioverter/defibrillator) present    Anxiety    Arthritis    CAD (coronary artery disease)    a. s/p PCI to LAD 2001 b. myoview 2014 high risk with scar LAD/RCA territory but no ischemia   Cardiomyopathy, ischemic    CHF (congestive heart failure) ([name])    class II/III   COPD  (chronic obstructive pulmonary disease) ([place])    smokes cigars but has quit cigarettes   Defibrillator discharge 04/21/2019   Diverticulitis    Dyspnea    Flu 03/2015   HTN (hypertension)    Hyperlipidemia    NSVT (nonsustained ventricular tachycardia) ([place]) 03/05/2017   Paroxysmal atrial fibrillation ([place]) 03/2015   chads2vasc score is 4   PSA (psoriatic arthritis) ([name])    increased   PVD (peripheral vascular disease) ([place])     Past Surgical History:  Procedure Laterality Date   ABDOMINAL AORTIC ENDOVASCULAR STENT GRAFT N/A 03/03/2017   Procedure: ABDOMINAL AORTIC ENDOVASCULAR STENT GRAFT;  Surgeon: Waynetta Sandy, MD;  Location: [place];  Service: Vascular;  Laterality: N/A;   cataract surgery     CORONARY ANGIOGRAPHY N/A 10/12/2020   Procedure: CORONARY ANGIOGRAPHY;  Surgeon: Lorretta Harp, MD;  Location: [place] [facility];  Service: Cardiovascular;  Laterality: N/A;   CORONARY ANGIOPLASTY WITH STENT PLACEMENT  2001   a. PCI to LAD   IMPLANTABLE CARDIOVERTER DEFIBRILLATOR (ICD) GENERATOR CHANGE N/A 03/18/2014   a. SJM Fortify ST DR ICD implanted by [facility] for primary prevention b. gen change 03/2014    LEAD REVISION/REPAIR N/A 07/17/2016   Procedure: Lead Revision/Repair;  Surgeon: Evans Lance, MD;  Location: [place] [facility];  Service: Cardiovascular;  Laterality: N/A;   LEFT HEART CATHETERIZATION WITH CORONARY ANGIOGRAM N/A 05/27/2014   Procedure: LEFT HEART CATHETERIZATION WITH CORONARY ANGIOGRAM;  Surgeon: Sherren Mocha, MD;  [facility];  Service: Cardiovascular;  Laterality: N/A;   REPAIR KNEE LIGAMENT       Home Medications:  Prior to Admission medications   Medication Sig Start Date End Date Taking? Authorizing Provider  acetaminophen (TYLENOL) 500 MG tablet Take 500-1,000 mg by mouth every 8 (eight) hours as needed (for pain).     [provider]  amiodarone (PACERONE) 200 MG tablet Take 400mg  twice daily for 1 week then take 200mg  twice a  day 10/24/20   Shirley Friar, PA-C  atorvastatin (LIPITOR) 80 MG tablet TAKE 1 TABLET BY MOUTH  DAILY AT 6 PM Patient taking differently: Take 80 mg by mouth every evening. 04/19/20   Lelon Perla, MD  carvedilol (COREG) 6.25 MG tablet Take 1 tablet (6.25 mg total) by mouth 2 (two) times daily with a meal. 10/16/20   Charlynne Cousins, MD  empagliflozin (JARDIANCE) 10 MG TABS tablet Take 1 tablet (10 mg total) by mouth daily. 10/17/20   Charlynne Cousins, MD  feeding supplement (ENSURE ENLIVE / ENSURE PLUS) LIQD Try to drink 1 can 1-2 times a day. Patient taking differently: Take 237 mLs by mouth in the morning and at bedtime. strawberry 06/04/20   Weaver, Scott T, PA-C  Fluticasone-Salmeterol (ADVAIR) 100-50 MCG/DOSE AEPB Inhale 1 puff into the lungs 2 (two) times daily. 01/07/20   Barrington Ellison  Mitzi Hansen, PA-C  furosemide (LASIX) 40 MG tablet Take 0.5 tablets (20 mg total) by mouth daily. 04/19/20   Allred, Jeneen Rinks, MD  isosorbide mononitrate (IMDUR) 30 MG 24 hr tablet Take 0.5 tablets (15 mg total) by mouth daily. 10/24/20 01/22/21  Shirley Friar, PA-C  melatonin 5 MG TABS Take 1 tablet (5 mg total) by mouth at bedtime. 10/16/20   Charlynne Cousins, MD  mexiletine (MEXITIL) 200 MG capsule Take 1 capsule (200 mg total) by mouth every 12 (twelve) hours. 10/16/20   Charlynne Cousins, MD  mometasone-formoterol (DULERA) 100-5 MCG/ACT AERO Inhale 2 puffs into the lungs in the morning and at bedtime. Patient not taking: Reported on 10/11/2020 07/12/20   Thompson Grayer, MD  Multiple Vitamin (MULTIVITAMIN) capsule Take 1 capsule by mouth daily.    [provider]  nitroGLYCERIN (NITROSTAT) 0.4 MG SL tablet Place 1 tablet (0.4 mg total) under the tongue every 5 (five) minutes x 3 doses as needed for chest pain. 06/04/20 06/04/21  Richardson Dopp T, PA-C  pantoprazole (PROTONIX) 40 MG tablet Take 1 tablet (40 mg total) by mouth daily. 10/16/20 10/16/21  Charlynne Cousins, MD   potassium chloride SA (KLOR-CON) 20 MEQ tablet TAKE 1 TABLET BY MOUTH  DAILY Patient taking differently: Take 20 mEq by mouth daily. 02/24/20   Lelon Perla, MD  Pseudoeph-Doxylamine-DM-APAP (NYQUIL PO) Take 1 Dose by mouth at bedtime as needed (sleep). Heart healty nyquil    [provider]  rivaroxaban (XARELTO) 20 MG TABS tablet Take 1 tablet (20 mg total) by mouth daily with supper. Patient taking differently: Take 20 mg by mouth at bedtime. 03/31/20   Baldwin Jamaica, PA-C  sacubitril-valsartan (ENTRESTO) 97-103 MG Take 1 tablet by mouth 2 (two) times daily. 10/16/20   Charlynne Cousins, MD  spironolactone (ALDACTONE) 25 MG tablet Take 1 tablet (25 mg total) by mouth at bedtime. 01/07/20   Shirley Friar, PA-C    Inpatient Medications: Scheduled Meds:  Continuous Infusions:  amiodarone 60 mg/hr (10/25/20 3976)   Followed by   amiodarone     magnesium sulfate bolus IVPB 2 g (10/25/20 0927)   PRN Meds:   Allergies:   No Known Allergies  Social History:   Social History   Socioeconomic History   Marital status: Married    Spouse name: Not on file   Number of children: Not on file   Years of education: Not on file   Highest education level: Not on file  Occupational History   Occupation: Lawn care  Tobacco Use   Smoking status: Every Day    Types: Cigars   Smokeless tobacco: Never   Tobacco comments:    smokes cigars now, no longer smokes cigarettes but previously smoked 2ppd  Vaping Use   Vaping Use: Never used  Substance and Sexual Activity   Alcohol use: No   Drug use: No   Sexual activity: Not Currently    Partners: Female    Birth control/protection: None  Other Topics Concern   Not on file  Social History Narrative   Not on file   Social Determinants of Health   Financial Resource Strain: Not on file  Food Insecurity: Not on file  Transportation Needs: Not on file  Physical Activity: Not on file  Stress: Not on file  Social  Connections: Not on file  Intimate Partner Violence: Not on file    Family History:   Family History  Problem Relation Age of Onset  Cirrhosis Mother        due to ETOH   Cancer Neg Hx      ROS:  Please see the history of present illness.  All other ROS reviewed and negative.     Physical Exam/Data:   Vitals:   10/25/20 0913  BP: 112/76  Pulse: (!) 140  Resp: (!) 26  SpO2: 95%   No intake or output data in the 24 hours ending 10/25/20 1003 Last 3 Weights 10/13/2020 10/12/2020 10/10/2020  Weight (lbs) 137 lb 2 oz 138 lb 3.7 oz 138 lb 14.2 oz  Weight (kg) 62.2 kg 62.7 kg 63 kg     There is no height or weight on file to calculate BMI.  General:  Well nourished, well developed, in no acute physical distress, though emotional HEENT: normal Neck: no JVD Vascular: No carotid bruits; Distal pulses 2+ bilaterally Cardiac:  RRR; no murmurs, gallops or rubs Lungs:  CTA b/l, no wheezing, rhonchi or rales  Abd: soft, nontender Ext: no edema Musculoskeletal:  No deformities, age appropriate atrophy Skin: warm and dry  Neuro:  no focal abnormalities noted Psych:  Normal affect   EKG:  The EKG was personally reviewed and demonstrates:    VT 134bpm, RBBB, LAD > ATP SR69 bpm, RBBB, LAD, no acute/ischemic looking changes  Telemetry:  Telemetry was personally reviewed and demonstrates:   VT 130's had 8 ATps > SR  Relevant CV Studies:  10/12/20: LHC Mid Cx lesion is 70% stenosed.   Ost LM to Dist LM lesion is 75% stenosed.   Mid RCA lesion is 70% stenosed.   Prox RCA to Mid RCA lesion is 50% stenosed.   Previously placed Mid LAD stent (unknown type) is  widely patent. IMPRESSION: Mr. Abdallah has 75% calcified distal left main.  His previously placed mid LAD stent is widely patent.  He does have a 70% mid AV groove circumflex and RCA disease.  I do not think he is an operative candidate.  It is certainly possible that this is contributory to his ventricular tachycardia.  Medical  therapy he Benjamin Cook be recommended.  The femoral sheath was removed and pressure was held on the groin to achieve hemostasis.  The patient left the lab in stable condition.  Dr. [name] was notified of these results.   11/23/2019: stress myoview Nuclear stress EF: 22%. No T wave inversion was noted during stress. There was no ST segment deviation noted during stress. Defect 1: There is a large defect of severe severity present in the basal inferior, mid anteroseptal, mid inferoseptal, mid inferior, apical septal, apical inferior, apical lateral and apex location. Findings consistent with prior myocardial infarction with peri-infarct ischemia. This is a high risk study. The left ventricular ejection fraction is severely decreased (<30%).   Large size, severe severity mostly fixed (SDS 3) apical to mid inferior, apical, apical septal and anteroapical perfusion defect suggestive of mid to distal LAD and RCA territory scar with minimal peri-infarct ischemia. LVEF 22% with akinesis of the previously mentioned walls. This is a high risk study. Compared to a prior study on 12/21/2012, there is no significant change.       04/09/2019; TTE IMPRESSIONS   1. Left ventricular ejection fraction, by estimation, is 35%%. The left  ventricle has moderate to severely decreased function. There is mild left  ventricular hypertrophy. Left ventricular diastolic parameters are  consistent with Grade I diastolic  dysfunction (impaired relaxation).   2. Right ventricular systolic function is normal. The right  ventricular  size is normal.   3. The mitral valve is normal in structure and function. Trivial mitral  valve regurgitation.   4. The aortic valve is abnormal. Aortic valve regurgitation is trivial.  Mild aortic valve sclerosis is present, with no evidence of aortic valve  stenosis.   5. The inferior vena cava is normal in size with greater than 50%  respiratory variability, suggesting right atrial pressure of  3 mmHg.    Laboratory Data:  High Sensitivity Troponin:  No results for input(s): TROPONINIHS in the last 720 hours.   ChemistryNo results for input(s): NA, K, CL, CO2, GLUCOSE, BUN, CREATININE, CALCIUM, MG, GFRNONAA, GFRAA, ANIONGAP in the last 168 hours.  No results for input(s): PROT, ALBUMIN, AST, ALT, ALKPHOS, BILITOT in the last 168 hours. Lipids No results for input(s): CHOL, TRIG, HDL, LABVLDL, LDLCALC, CHOLHDL in the last 168 hours.  HematologyNo results for input(s): WBC, RBC, HGB, HCT, MCV, MCH, MCHC, RDW, PLT in the last 168 hours. Thyroid No results for input(s): TSH, FREET4 in the last 168 hours.  BNPNo results for input(s): BNP, PROBNP in the last 168 hours.  DDimer No results for input(s): DDIMER in the last 168 hours.   Radiology/Studies:  No results found.   Assessment and Plan:   VT storm Agree with amio bolus and gtt Labs are pending to be drawn, d/w RN Increase mexiletine to 300mg  BID Benjamin Cook get full device interrogation and see if there is any room for adjustment given he is requiring numerous spins of ATP to convert some times  2. CAD Cath 10/12/20 with CAD as discussed Not felt to have good intervention targets, no plans for CABG Continue nitrate   3. ICM 4. Chronic CHF (systolic) Does not appear volume OL Benjamin Cook hold his entresto for now and see how his BP does SBP 110s currently   5. Paroxysmal AFib CHA2DS2Vasc is 5 Continue xarelto  6. Itching? He mentions intermittent and ransom areas of itching No rash noted Unclear cause follow  Dr. Curt Bears has seen and examined the patient   Risk Assessment/Risk Scores:    For questions or updates, please contact [facility] Please consult [email or web address] for contact info under    Signed, Baldwin Jamaica, PA-C  10/25/2020 10:03 AM  I have seen and examined this patient with Tommye Standard.  Agree with above, note added to reflect my findings.  On exam, RRR, no murmurs.  Patient presented to the  emergency room after an ICD shock.  He has had VT storm throughout the day today and yesterday.  He feels poorly when he is in VT.  His VT is slow, at around 120s to 130s.  He has a history of VT and is on amiodarone and mexiletine.  He does have a history of coronary artery disease, though he does not seem ischemic.  We are currently awaiting labs.  He is on IV amiodarone.  Benjamin Cook increase mexiletine to 300 mg twice daily.  We have also adjusted his ATP to be more aggressive.  He was initially set to 100 therapies.  Have reduce this to 12 and have spread up his ATP therapy.  At this point, due to his lack of ischemic symptoms, I do not feel like left heart catheterization is warranted.  He is also been evaluated by interventional cardiology who agrees.  We Larken Urias continue to monitor overnight on IV amiodarone.  Benjamin Mauss M. Benjamin Catano MD 10/25/2020 11:05 AM

## 2020-10-25 NOTE — ED Provider Notes (Signed)
[facility] EMERGENCY DEPARTMENT Provider Note   CSN: [id number] Arrival date & time: 10/25/20  3329     History Chief Complaint  Patient presents with   Irregular Heart Beat    Benjamin Cook is a 75 y.o. male.  The history is provided by the patient and medical records. No language interpreter was used.   75 year old male significant history CAD, history of paroxysmal atrial fibrillation as well as recurrent ventricular tachycardia currently on amiodarone brought here via EMS from home for complaints of weakness.  Patient was supposed to have his amiodarone increased from 200 mg daily to 400 mg BID yesterday. This morning patient felt his defibrillator firing and thus he called EMS.  He also endorsed generalized weakness and fatigue since then.  He denies any active chest pain or shortness of breath no fever chills no other changes.  When EMS arrived, patient was in ventricular tachycardia, patient received a total of 350 mg of amiodarone prior to arrival.  His blood pressure remains stable until he arrived to the ER.  Blood pressure did drop but subsequently improved.  Patient apparently has had recurrent ventricular tachycardia and is mostly well managed with medical management.  Denies missing any medications until yesterday. Level V caveats due to acuity of the illness  Past Medical History:  Diagnosis Date   Adrenal mass ([place])    per pt this is remote (10 years) and benign by biopsy   AICD (automatic cardioverter/defibrillator) present    Anxiety    Arthritis    CAD (coronary artery disease)    a. s/p PCI to LAD 2001 b. myoview 2014 high risk with scar LAD/RCA territory but no ischemia   Cardiomyopathy, ischemic    CHF (congestive heart failure) ([name])    class II/III   COPD (chronic obstructive pulmonary disease) ([place])    smokes cigars but has quit cigarettes   Defibrillator discharge 04/21/2019   Diverticulitis    Dyspnea    Flu 03/2015   HTN  (hypertension)    Hyperlipidemia    NSVT (nonsustained ventricular tachycardia) ([place]) 03/05/2017   Paroxysmal atrial fibrillation ([place]) 03/2015   chads2vasc score is 4   PSA (psoriatic arthritis) ([place])    increased   PVD (peripheral vascular disease) ([place])     Patient Active Problem List   Diagnosis Date Noted   V tach ([place]) 10/11/2020   Abdominal pain 10/11/2020   Ventricular tachycardia ([place]) 10/11/2020   Hypokalemia 06/04/2020   Elevated troponin 06/04/2020   Malnutrition of moderate degree 06/04/2020   Defibrillator discharge 04/22/2019   Hypocalcemia 08/08/2017   Goals of care, counseling/discussion 08/08/2017   Anemia, iron deficiency 08/08/2017   NSVT (nonsustained ventricular tachycardia) ([place]) 03/05/2017   AAA (abdominal aortic aneurysm) ([place]) 03/03/2017   Low back pain 01/01/2017   Adrenal mass ([place]) 11/24/2016   Acute Sigmoid diverticulitis 11/15/2016   Diverticulitis 11/15/2016   ICD (implantable cardioverter-defibrillator) lead failure 07/16/2016   Paroxysmal atrial fibrillation ([place]) 06/26/2015   Dyspnea 04/10/2015   Acute respiratory failure ([place]) 03/28/2015   Essential hypertension 51/88/4166   Systolic CHF ([place]) 08/04/1599   Pyrexia    Shortness of breath    Influenza A    VT (ventricular tachycardia) ([place])    ICD (implantable cardioverter-defibrillator) in place 06/08/2010   HFrEF (heart failure with reduced ejection fraction) ([place]) 04/18/2010   WEIGHT LOSS 11/01/2008   TOBACCO ABUSE 09/06/2008   Aneurysm of abdominal vessel (5.6 CM) 09/06/2008   HLD (hyperlipidemia) 09/05/2008  COUGH 04/08/2007   PSA, INCREASED 04/08/2007   OTHER ABNORMAL BLOOD CHEMISTRY 03/24/2007   Adrenal nodule ([place]) 10/01/2006   Anxiety state 10/01/2006   HYPERTENSION 10/01/2006   CAD (coronary artery disease) 10/01/2006   Cardiomyopathy, ischemic 10/01/2006   COPD, severe ([place]) 10/01/2006   HYPERGLYCEMIA 10/01/2006   HEMATURIA, HX OF 10/01/2006    Past Surgical History:   Procedure Laterality Date   ABDOMINAL AORTIC ENDOVASCULAR STENT GRAFT N/A 03/03/2017   Procedure: ABDOMINAL AORTIC ENDOVASCULAR STENT GRAFT;  Surgeon: Waynetta Sandy, MD;  Location: [place];  Service: Vascular;  Laterality: N/A;   cataract surgery     CORONARY ANGIOGRAPHY N/A 10/12/2020   Procedure: CORONARY ANGIOGRAPHY;  Surgeon: Lorretta Harp, MD;  Location: [place] CV LAB;  Service: Cardiovascular;  Laterality: N/A;   CORONARY ANGIOPLASTY WITH STENT PLACEMENT  2001   a. PCI to LAD   IMPLANTABLE CARDIOVERTER DEFIBRILLATOR (ICD) GENERATOR CHANGE N/A 03/18/2014   a. SJM Fortify ST DR ICD implanted by [facility] for primary prevention b. gen change 03/2014    LEAD REVISION/REPAIR N/A 07/17/2016   Procedure: Lead Revision/Repair;  Surgeon: Evans Lance, MD;  Location: [place] [facility];  Service: Cardiovascular;  Laterality: N/A;   LEFT HEART CATHETERIZATION WITH CORONARY ANGIOGRAM N/A 05/27/2014   Procedure: LEFT HEART CATHETERIZATION WITH CORONARY ANGIOGRAM;  Surgeon: Sherren Mocha, MD;  Location: [facility] CATH LAB;  Service: Cardiovascular;  Laterality: N/A;   REPAIR KNEE LIGAMENT         Family History  Problem Relation Age of Onset   Cirrhosis Mother        due to ETOH   Cancer Neg Hx     Social History   Tobacco Use   Smoking status: Every Day    Types: Cigars   Smokeless tobacco: Never   Tobacco comments:    smokes cigars now, no longer smokes cigarettes but previously smoked 2ppd  Vaping Use   Vaping Use: Never used  Substance Use Topics   Alcohol use: No   Drug use: No    Home Medications Prior to Admission medications   Medication Sig Start Date End Date Taking? Authorizing Provider  acetaminophen (TYLENOL) 500 MG tablet Take 500-1,000 mg by mouth every 8 (eight) hours as needed (for pain).     [provider]  amiodarone (PACERONE) 200 MG tablet Take 400mg  twice daily for 1 week then take 200mg  twice a day 10/24/20   Shirley Friar, PA-C   atorvastatin (LIPITOR) 80 MG tablet TAKE 1 TABLET BY MOUTH  DAILY AT 6 PM Patient taking differently: Take 80 mg by mouth every evening. 04/19/20   Lelon Perla, MD  carvedilol (COREG) 6.25 MG tablet Take 1 tablet (6.25 mg total) by mouth 2 (two) times daily with a meal. 10/16/20   Charlynne Cousins, MD  empagliflozin (JARDIANCE) 10 MG TABS tablet Take 1 tablet (10 mg total) by mouth daily. 10/17/20   Charlynne Cousins, MD  feeding supplement (ENSURE ENLIVE / ENSURE PLUS) LIQD Try to drink 1 can 1-2 times a day. Patient taking differently: Take 237 mLs by mouth in the morning and at bedtime. strawberry 06/04/20   Weaver, Scott T, PA-C  Fluticasone-Salmeterol (ADVAIR) 100-50 MCG/DOSE AEPB Inhale 1 puff into the lungs 2 (two) times daily. 01/07/20   Shirley Friar, PA-C  furosemide (LASIX) 40 MG tablet Take 0.5 tablets (20 mg total) by mouth daily. 04/19/20   Allred, Jeneen Rinks, MD  isosorbide mononitrate (IMDUR) 30 MG 24 hr  tablet Take 0.5 tablets (15 mg total) by mouth daily. 10/24/20 01/22/21  Shirley Friar, PA-C  melatonin 5 MG TABS Take 1 tablet (5 mg total) by mouth at bedtime. 10/16/20   Charlynne Cousins, MD  mexiletine (MEXITIL) 200 MG capsule Take 1 capsule (200 mg total) by mouth every 12 (twelve) hours. 10/16/20   Charlynne Cousins, MD  mometasone-formoterol (DULERA) 100-5 MCG/ACT AERO Inhale 2 puffs into the lungs in the morning and at bedtime. Patient not taking: Reported on 10/11/2020 07/12/20   Thompson Grayer, MD  Multiple Vitamin (MULTIVITAMIN) capsule Take 1 capsule by mouth daily.    [provider]  nitroGLYCERIN (NITROSTAT) 0.4 MG SL tablet Place 1 tablet (0.4 mg total) under the tongue every 5 (five) minutes x 3 doses as needed for chest pain. 06/04/20 06/04/21  Richardson Dopp T, PA-C  pantoprazole (PROTONIX) 40 MG tablet Take 1 tablet (40 mg total) by mouth daily. 10/16/20 10/16/21  Charlynne Cousins, MD  potassium chloride SA (KLOR-CON) 20 MEQ tablet TAKE  1 TABLET BY MOUTH  DAILY Patient taking differently: Take 20 mEq by mouth daily. 02/24/20   Lelon Perla, MD  Pseudoeph-Doxylamine-DM-APAP (NYQUIL PO) Take 1 Dose by mouth at bedtime as needed (sleep). Heart healty nyquil    [provider]  rivaroxaban (XARELTO) 20 MG TABS tablet Take 1 tablet (20 mg total) by mouth daily with supper. Patient taking differently: Take 20 mg by mouth at bedtime. 03/31/20   Baldwin Jamaica, PA-C  sacubitril-valsartan (ENTRESTO) 97-103 MG Take 1 tablet by mouth 2 (two) times daily. 10/16/20   Charlynne Cousins, MD  spironolactone (ALDACTONE) 25 MG tablet Take 1 tablet (25 mg total) by mouth at bedtime. 01/07/20   Shirley Friar, PA-C    Allergies    Patient has no known allergies.  Review of Systems   Review of Systems  Unable to perform ROS: Acuity of condition   Physical Exam Updated Vital Signs BP 112/76 (BP Location: Right Arm)   Pulse (!) 140   Resp (!) 26   SpO2 95%   Physical Exam Vitals and nursing note reviewed.  Constitutional:      Appearance: He is well-developed.     Comments: Patient alert, moaning, appears uncomfortable  HENT:     Head: Atraumatic.  Eyes:     Conjunctiva/sclera: Conjunctivae normal.  Cardiovascular:     Rate and Rhythm: Tachycardia present.  Pulmonary:     Effort: Pulmonary effort is normal.     Breath sounds: Normal breath sounds.  Abdominal:     Palpations: Abdomen is soft.  Musculoskeletal:     Cervical back: Neck supple.     Right lower leg: No edema.     Left lower leg: No edema.  Skin:    Capillary Refill: Pulses are thready but palpable in all 4 extremities.    Findings: No rash.  Neurological:     Mental Status: He is alert. Mental status is at baseline.    ED Results / Procedures / Treatments   Labs (all labs ordered are listed, but only abnormal results are displayed) Labs Reviewed  BASIC METABOLIC PANEL - Abnormal; Notable for the following components:      Result  Value   Glucose, Bld 103 (*)    Calcium 8.6 (*)    All other components within normal limits  CBC - Abnormal; Notable for the following components:   WBC 10.6 (*)    All other components within normal limits  MAGNESIUM - Abnormal; Notable for the following components:   Magnesium 2.7 (*)    All other components within normal limits  BASIC METABOLIC PANEL - Abnormal; Notable for the following components:   Glucose, Bld 107 (*)    Calcium 8.6 (*)    All other components within normal limits  HEMOGLOBIN AND HEMATOCRIT, BLOOD - Abnormal; Notable for the following components:   Hemoglobin 12.6 (*)    HCT 37.5 (*)    All other components within normal limits  BASIC METABOLIC PANEL - Abnormal; Notable for the following components:   Glucose, Bld 101 (*)    Calcium 8.5 (*)    All other components within normal limits  BASIC METABOLIC PANEL - Abnormal; Notable for the following components:   Sodium 134 (*)    Glucose, Bld 104 (*)    Calcium 8.7 (*)    All other components within normal limits  TROPONIN I (HIGH SENSITIVITY) - Abnormal; Notable for the following components:   Troponin I (High Sensitivity) 19 (*)    All other components within normal limits  TROPONIN I (HIGH SENSITIVITY) - Abnormal; Notable for the following components:   Troponin I (High Sensitivity) 22 (*)    All other components within normal limits  RESP PANEL BY RT-PCR (FLU A&B, COVID) ARPGX2  MRSA NEXT GEN BY PCR, NASAL  MAGNESIUM  GLUCOSE, CAPILLARY  POCT ACTIVATED CLOTTING TIME  POCT ACTIVATED CLOTTING TIME  POCT ACTIVATED CLOTTING TIME  POCT ACTIVATED CLOTTING TIME    EKG EKG Interpretation  Date/Time:  Wednesday October 25 2020 09:56:19 EDT Ventricular Rate:  69 PR Interval:  212 QRS Duration: 182 QT Interval:  507 QTC Calculation: 544 R Axis:   -85 Text Interpretation: Sinus rhythm Borderline prolonged PR interval Ventricular preexcitation(WPW) Abnormal ECG Confirmed by Carmin Muskrat [id number]) on  10/26/2020 11:09:46 PM  Radiology No results found.  Procedures .Critical Care Performed by: Domenic Moras, PA-C Authorized by: Domenic Moras, PA-C   Critical care provider statement:    Critical care time (minutes):  32   Critical care was time spent personally by me on the following activities:  Discussions with consultants, evaluation of patient's response to treatment, examination of patient, ordering and performing treatments and interventions, ordering and review of laboratory studies, ordering and review of radiographic studies, pulse oximetry, re-evaluation of patient's condition, obtaining history from patient or surrogate and review of old charts   Medications Ordered in ED Medications  alteplase (CATHFLO ACTIVASE) 2 MG injection (has no administration in time range)  magnesium sulfate IVPB 2 g 50 mL (0 g Intravenous Stopped 10/25/20 1021)  amiodarone (NEXTERONE) 1.8 mg/mL load via infusion 150 mg (150 mg Intravenous Bolus from Bag 10/25/20 0948)  potassium chloride SA (KLOR-CON) CR tablet 40 mEq (40 mEq Oral Given 10/25/20 1254)    ED Course  I have reviewed the triage vital signs and the nursing notes.  Pertinent labs & imaging results that were available during my care of the patient were reviewed by me and considered in my medical decision making (see chart for details).    MDM Rules/Calculators/A&P                           BP 112/76 (BP Location: Right Arm)   Pulse (!) 140   Resp (!) 26   SpO2 95%   Final Clinical Impression(s) / ED Diagnoses Final diagnoses:  Ventricular tachyarrhythmia ([place])    Rx / DC Orders ED Discharge Orders  Ordered    amiodarone (PACERONE) 200 MG tablet  2 times daily        10/28/20 0952    mexiletine (MEXITIL) 150 MG capsule  Every 12 hours        10/28/20 0952    Increase activity slowly        10/28/20 4580    Diet - low sodium heart healthy        10/28/20 0952    Call MD for:  temperature >100.4        10/28/20 9983     Call MD for:  persistant nausea and vomiting        10/28/20 0952    Call MD for:  severe uncontrolled pain        10/28/20 0952    Call MD for:  redness, tenderness, or signs of infection (pain, swelling, redness, odor or green/yellow discharge around incision site)        10/28/20 0952    Call MD for:  difficulty breathing, headache or visual disturbances        10/28/20 0952    Call MD for:  hives        10/28/20 3825    Call MD for:  persistant dizziness or light-headedness        10/28/20 0952    Call MD for:  extreme fatigue        10/28/20 0952           9:33 AM Patient with known history of recurrent V. tach currently on amiodarone.  I have reviewed patient's prior notes apparently his pacemaker/defibrillator was indicated patient has sustained V. tach and cardiology reach out to him yesterday and requested him to increase his amiodarone to 400 mg twice BID x1 week.  He did receive that dose yesterday, however this morning he began weak and his defibrillator fired thus prompting ER visit.  Patient had a sustained heart rates of 140-150 beat per minute showing V. tach on monitor via EMS.  He received a total of 350 mg of amiodarone prior to arrival.  Once arrived, his blood pressure did transiently dropped into the 05L systolic.  However, BP improves to 976 systolic once IV access placed.  Patient is currently on pads, will receive 2 g of magnesium, will also order amiodarone infusion.  Appreciate consultation to cardiology who will promptly see patient for further management.  At this time he is mentating appropriately, he denies any active chest pain or shortness of breath.  CAre discussed with Dr. Vanita Panda.   9:47 AM Cardiology Dr. Irish Lack was at bedside to assess pt.  He recommend giving and additional 150mg  amiodarone bolus.  EP Dr. Curt Bears will see and admit pt for further care.  At this time BP stable and HR did improves.  Pt felt better.     Domenic Moras, PA-C 11/02/20  1524    Carmin Muskrat, MD 11/09/20 0830

## 2020-10-25 NOTE — ED Triage Notes (Addendum)
Patient BIB [facility] from home with recurrent runs of vtach. Patient has defibrilator and recently had his home dose of amiodarone increased to 400mg  but has not taken today. Patient received 350 mg amioadrone IV from EMS PTA.

## 2020-10-26 ENCOUNTER — Encounter: Payer: Medicare (Managed Care) | Admitting: Student

## 2020-10-26 ENCOUNTER — Encounter (HOSPITAL_COMMUNITY)
Admission: EM | Disposition: A | Discharge status: Home / Self Care | Payer: Self-pay | Source: Home / Self Care | Attending: Cardiology

## 2020-10-26 DIAGNOSIS — I472 Ventricular tachycardia: Secondary | ICD-10-CM

## 2020-10-26 HISTORY — PX: V TACH ABLATION: EP1227

## 2020-10-26 LAB — CUP PACEART REMOTE DEVICE CHECK
Battery Remaining Longevity: 23 mo
Battery Remaining Percentage: 24 %
Battery Voltage: 2.92 V
Brady Statistic AP VP Percent: 1 %
Brady Statistic AP VS Percent: 35 %
Brady Statistic AS VP Percent: 1 %
Brady Statistic AS VS Percent: 65 %
Brady Statistic RA Percent Paced: 35 %
Brady Statistic RV Percent Paced: 1 %
Date Time Interrogation Session: 20220921075914
HighPow Impedance: 68 Ohm
HighPow Impedance: 69 Ohm
Implantable Lead Implant Date: 20120420
Implantable Lead Implant Date: 20180613
Implantable Lead Location: 753859
Implantable Lead Location: 753860
Implantable Pulse Generator Implant Date: 20160212
Lead Channel Impedance Value: 340 Ohm
Lead Channel Impedance Value: 530 Ohm
Lead Channel Pacing Threshold Amplitude: 0.5 V
Lead Channel Pacing Threshold Amplitude: 0.5 V
Lead Channel Pacing Threshold Pulse Width: 0.5 ms
Lead Channel Pacing Threshold Pulse Width: 0.5 ms
Lead Channel Sensing Intrinsic Amplitude: 11.6 mV
Lead Channel Sensing Intrinsic Amplitude: 2.1 mV
Lead Channel Setting Pacing Amplitude: 2 V
Lead Channel Setting Pacing Amplitude: 2.5 V
Lead Channel Setting Pacing Pulse Width: 0.5 ms
Lead Channel Setting Sensing Sensitivity: 0.5 mV
Pulse Gen Serial Number: 7225079

## 2020-10-26 LAB — HEMOGLOBIN AND HEMATOCRIT, BLOOD
HCT: 37.5 % — ABNORMAL LOW (ref 39.0–52.0)
Hemoglobin: 12.6 g/dL — ABNORMAL LOW (ref 13.0–17.0)

## 2020-10-26 LAB — BASIC METABOLIC PANEL
Anion gap: 7 (ref 5–15)
BUN: 10 mg/dL (ref 8–23)
CO2: 24 mmol/L (ref 22–32)
Calcium: 8.6 mg/dL — ABNORMAL LOW (ref 8.9–10.3)
Chloride: 106 mmol/L (ref 98–111)
Creatinine, Ser: 1.06 mg/dL (ref 0.61–1.24)
GFR, Estimated: >60 mL/min (ref >60)
Glucose, Bld: 107 mg/dL — ABNORMAL HIGH (ref 70–99)
Potassium: 4.2 mmol/L (ref 3.5–5.1)
Sodium: 137 mmol/L (ref 135–145)

## 2020-10-26 LAB — POCT ACTIVATED CLOTTING TIME
Activated Clotting Time: 277 seconds
Activated Clotting Time: 283 seconds
Activated Clotting Time: 347 seconds
Activated Clotting Time: 127 seconds

## 2020-10-26 LAB — GLUCOSE, CAPILLARY: Glucose-Capillary: 77 mg/dL (ref 70–99)

## 2020-10-26 LAB — MRSA NEXT GEN BY PCR, NASAL: MRSA by PCR Next Gen: NOT DETECTED

## 2020-10-26 LAB — MAGNESIUM: Magnesium: 2.1 mg/dL (ref 1.7–2.4)

## 2020-10-26 SURGERY — V TACH ABLATION

## 2020-10-26 MED ORDER — SODIUM CHLORIDE 0.9 % IV SOLN: 250.0000 mL | INTRAVENOUS | Status: DC | PRN

## 2020-10-26 MED ORDER — HEPARIN (PORCINE) IN NACL 1000-0.9 UT/500ML-% IV SOLN
INTRAVENOUS | Status: DC | PRN
  Administered 2020-10-26 (×2): 500 mL

## 2020-10-26 MED ORDER — NITROGLYCERIN 0.4 MG SL SUBL: 0.4000 mg | SUBLINGUAL_TABLET | SUBLINGUAL | Status: DC | PRN

## 2020-10-26 MED ORDER — HEPARIN SODIUM (PORCINE) 1000 UNIT/ML IJ SOLN
INTRAMUSCULAR | Status: AC
  Filled 2020-10-26: qty 1

## 2020-10-26 MED ORDER — LIDOCAINE HCL 1 % IJ SOLN
INTRAMUSCULAR | Status: AC
  Filled 2020-10-26: qty 40

## 2020-10-26 MED ORDER — PROTAMINE SULFATE 10 MG/ML IV SOLN
INTRAVENOUS | Status: DC | PRN
  Administered 2020-10-26: 30 mg via INTRAVENOUS

## 2020-10-26 MED ORDER — PROTAMINE SULFATE 10 MG/ML IV SOLN
INTRAVENOUS | Status: AC
  Filled 2020-10-26: qty 5

## 2020-10-26 MED ORDER — HEPARIN SODIUM (PORCINE) 1000 UNIT/ML IJ SOLN
INTRAMUSCULAR | Status: DC | PRN
  Administered 2020-10-26: 1000 [IU] via INTRAVENOUS
  Administered 2020-10-26: 7000 [IU] via INTRAVENOUS
  Administered 2020-10-26: 1000 [IU] via INTRAVENOUS
  Administered 2020-10-26: 2000 [IU] via INTRAVENOUS

## 2020-10-26 MED ORDER — SODIUM CHLORIDE 0.9 % IV SOLN: INTRAVENOUS | Status: DC

## 2020-10-26 MED ORDER — FENTANYL CITRATE (PF) 100 MCG/2ML IJ SOLN
INTRAMUSCULAR | Status: AC
  Filled 2020-10-26: qty 2

## 2020-10-26 MED ORDER — MIDAZOLAM HCL 5 MG/5ML IJ SOLN
INTRAMUSCULAR | Status: AC
  Filled 2020-10-26: qty 5

## 2020-10-26 MED ORDER — FENTANYL CITRATE (PF) 100 MCG/2ML IJ SOLN
INTRAMUSCULAR | Status: DC | PRN
  Administered 2020-10-26: 25 ug via INTRAVENOUS
  Administered 2020-10-26 (×2): 12.5 ug via INTRAVENOUS
  Administered 2020-10-26: 25 ug via INTRAVENOUS
  Administered 2020-10-26: 12.5 ug via INTRAVENOUS
  Administered 2020-10-26 (×2): 25 ug via INTRAVENOUS
  Administered 2020-10-26 (×2): 12.5 ug via INTRAVENOUS

## 2020-10-26 MED ORDER — OPTICHAMBER DIAMOND MISC
1.0000 | Freq: Once | Status: DC
  Filled 2020-10-26: qty 1

## 2020-10-26 MED ORDER — AEROCHAMBER PLUS FLO-VU MEDIUM MISC
1.0000 | Freq: Once | Status: DC
  Filled 2020-10-26: qty 1

## 2020-10-26 MED ORDER — AEROCHAMBER PLUS FLO-VU MISC: 1.0000 | Freq: Once | Status: DC

## 2020-10-26 MED ORDER — BUPIVACAINE HCL (PF) 0.25 % IJ SOLN
INTRAMUSCULAR | Status: DC | PRN
  Administered 2020-10-26: 50 mL

## 2020-10-26 MED ORDER — SODIUM CHLORIDE 0.9% FLUSH: 3.0000 mL | INTRAVENOUS | Status: DC | PRN

## 2020-10-26 MED ORDER — ONDANSETRON HCL 4 MG/2ML IJ SOLN: 4.0000 mg | Freq: Four times a day (QID) | INTRAMUSCULAR | Status: DC | PRN

## 2020-10-26 MED ORDER — BUPIVACAINE HCL (PF) 0.25 % IJ SOLN
INTRAMUSCULAR | Status: AC
  Filled 2020-10-26: qty 60

## 2020-10-26 MED ORDER — ACETAMINOPHEN 325 MG PO TABS: 650.0000 mg | ORAL_TABLET | ORAL | Status: DC | PRN

## 2020-10-26 MED ORDER — AEROCHAMBER PLUS FLO-VU LARGE MISC: 1.0000 | Freq: Once | Status: DC

## 2020-10-26 MED ORDER — ALTEPLASE 2 MG IJ SOLR
INTRAMUSCULAR | Status: AC
  Filled 2020-10-26: qty 4

## 2020-10-26 MED ORDER — SODIUM CHLORIDE 0.9% FLUSH
3.0000 mL | Freq: Two times a day (BID) | INTRAVENOUS | Status: DC
  Administered 2020-10-26 – 2020-10-28 (×4): 3 mL via INTRAVENOUS

## 2020-10-26 MED ORDER — MIDAZOLAM HCL 5 MG/5ML IJ SOLN
INTRAMUSCULAR | Status: DC | PRN
  Administered 2020-10-26 (×2): 2 mg via INTRAVENOUS
  Administered 2020-10-26: 1 mg via INTRAVENOUS
  Administered 2020-10-26: 2 mg via INTRAVENOUS
  Administered 2020-10-26 (×4): 1 mg via INTRAVENOUS
  Administered 2020-10-26: 2 mg via INTRAVENOUS

## 2020-10-26 MED ORDER — MEXILETINE HCL 200 MG PO CAPS: 200.0000 mg | ORAL_CAPSULE | Freq: Two times a day (BID) | ORAL | Status: DC

## 2020-10-26 MED ORDER — AMIODARONE HCL IN DEXTROSE 360-4.14 MG/200ML-% IV SOLN
30.0000 mg/h | INTRAVENOUS | Status: DC
  Administered 2020-10-26 – 2020-10-27 (×2): 30 mg/h via INTRAVENOUS
  Filled 2020-10-26 (×2): qty 200

## 2020-10-26 SURGICAL SUPPLY — 13 items
BAG SNAP BAND KOVER 36X36 (MISCELLANEOUS) ×1 IMPLANT
CATH JOSEPH QUAD ALLRED 6F REP (CATHETERS) ×2 IMPLANT
CATH SMTCH THERMOCOOL SF DF (CATHETERS) ×1 IMPLANT
CATH WEBSTER BI DIR CS D-F CRV (CATHETERS) ×1 IMPLANT
PACK EP LATEX FREE (CUSTOM PROCEDURE TRAY) ×2
PACK EP LF (CUSTOM PROCEDURE TRAY) ×1 IMPLANT
PAD PRO RADIOLUCENT 2001M-C (PAD) ×2 IMPLANT
PATCH CARTO3 (PAD) ×1 IMPLANT
SHEATH PINNACLE 6F 10CM (SHEATH) ×2 IMPLANT
SHEATH PINNACLE 7F 10CM (SHEATH) ×1 IMPLANT
SHEATH SET SUPER ARROWFLEX 8FR (SHEATH) ×1 IMPLANT
TUBING SMART ABLATE COOLFLOW (TUBING) ×1 IMPLANT
WIRE HI TORQ VERSACORE-J 145CM (WIRE) ×1 IMPLANT

## 2020-10-26 NOTE — Progress Notes (Addendum)
Progress Note  Patient Name: Benjamin Cook Date of Encounter: 10/26/2020  [facility] Cardiologist: Kirk Ruths, MD   Subjective   No CP, dizziness with VT  Inpatient Medications    Scheduled Meds:  AeroChamber Plus Flo-Vu Medium  1 each Other Once   atorvastatin  80 mg Oral QPM   carvedilol  6.25 mg Oral BID WC   Chlorhexidine Gluconate Cloth  6 each Topical Daily   empagliflozin  10 mg Oral Daily   furosemide  20 mg Oral Daily   isosorbide mononitrate  15 mg Oral Daily   mouth rinse  15 mL Mouth Rinse BID   melatonin  5 mg Oral QHS   mexiletine  300 mg Oral Q12H   mometasone-formoterol  2 puff Inhalation BID   pantoprazole  40 mg Oral Daily   potassium chloride SA  20 mEq Oral Daily   Rivaroxaban  15 mg Oral Q supper   spironolactone  25 mg Oral QHS   Continuous Infusions:  amiodarone 30 mg/hr (10/26/20 0600)   PRN Meds: acetaminophen, nitroGLYCERIN   Vital Signs    Vitals:   10/26/20 0530 10/26/20 0600 10/26/20 0630 10/26/20 0802  BP: (!) 158/62 (!) 157/66 (!) 149/68   Pulse: 60 61 61   Resp: 19 19 (!) 22   Temp:    97.7 F (36.5 C)  TempSrc:    Oral  SpO2: 98% 97% 97%   Weight: 61.2 kg       Intake/Output Summary (Last 24 hours) at 10/26/2020 0845 Last data filed at 10/26/2020 0600 Gross per 24 hour  Intake 1371.37 ml  Output 1280 ml  Net 91.37 ml   Last 3 Weights 10/26/2020 10/13/2020 10/12/2020  Weight (lbs) 134 lb 14.7 oz 137 lb 2 oz 138 lb 3.7 oz  Weight (kg) 61.2 kg 62.2 kg 62.7 kg      Telemetry    SR, had a few episodes of VT last night this AM tx with ATP - Personally Reviewed  ECG    No new EKGs - Personally Reviewed  Physical Exam   GEN: No acute distress, chronically ill appearing.   Neck: No JVD Cardiac: RRR, no murmurs, rubs, or gallops.  Respiratory: CTA b/l, not wheezing. GI: Soft, nontender, non-distended  MS: No edema; No deformity, advanced atrophy Neuro:  Nonfocal  Psych: Normal affect   Labs    High  Sensitivity Troponin:   Recent Labs  Lab 10/25/20 1038 10/25/20 1228  TROPONINIHS 19* 22*     Chemistry Recent Labs  Lab 10/25/20 1038 10/26/20 0105 10/26/20 0153  NA 137 137  --   K 3.7 4.2  --   CL 106 106  --   CO2 22 24  --   GLUCOSE 103* 107*  --   BUN 9 10  --   CREATININE 1.22 1.06  --   CALCIUM 8.6* 8.6*  --   MG 2.7*  --  2.1  GFRNONAA >60 >60  --   ANIONGAP 9 7  --     Lipids No results for input(s): CHOL, TRIG, HDL, LABVLDL, LDLCALC, CHOLHDL in the last 168 hours.  Hematology Recent Labs  Lab 10/25/20 1038  WBC 10.6*  RBC 4.46  HGB 14.2  HCT 41.8  MCV 93.7  MCH 31.8  MCHC 34.0  RDW 15.3  PLT 286   Thyroid No results for input(s): TSH, FREET4 in the last 168 hours.  BNPNo results for input(s): BNP, PROBNP in the last 168 hours.  DDimer No results for input(s): DDIMER in the last 168 hours.   Radiology    DG Chest Port 1 View  Result Date: 10/25/2020 CLINICAL DATA:  Weakness. EXAM: PORTABLE CHEST 1 VIEW COMPARISON:  10/10/2020 FINDINGS: Left chest wall ICD noted with leads in the right ventricle and right atrial appendage. Aortic atherosclerotic calcifications. Normal heart size. No pleural effusion or edema. No airspace densities identified. IMPRESSION: No acute cardiopulmonary abnormalities. Aortic Atherosclerosis (ICD10-I70.0). Electronically Signed   By: Kerby Moors M.D.   On: 10/25/2020 10:31    Cardiac Studies   10/12/20: LHC Mid Cx lesion is 70% stenosed.   Ost LM to Dist LM lesion is 75% stenosed.   Mid RCA lesion is 70% stenosed.   Prox RCA to Mid RCA lesion is 50% stenosed.   Previously placed Mid LAD stent (unknown type) is  widely patent. IMPRESSION: [name] has 75% calcified distal left main.  His previously placed mid LAD stent is widely patent.  He does have a 70% mid AV groove circumflex and RCA disease.  I do not think he is an operative candidate.  It is certainly possible that this is contributory to his ventricular  tachycardia.  Medical therapy he will be recommended.  The femoral sheath was removed and pressure was held on the groin to achieve hemostasis.  The patient left the lab in stable condition.  Dr. [name] was notified of these results.   11/23/2019: stress myoview Nuclear stress EF: 22%. No T wave inversion was noted during stress. There was no ST segment deviation noted during stress. Defect 1: There is a large defect of severe severity present in the basal inferior, mid anteroseptal, mid inferoseptal, mid inferior, apical septal, apical inferior, apical lateral and apex location. Findings consistent with prior myocardial infarction with peri-infarct ischemia. This is a high risk study. The left ventricular ejection fraction is severely decreased (<30%).   Large size, severe severity mostly fixed (SDS 3) apical to mid inferior, apical, apical septal and anteroapical perfusion defect suggestive of mid to distal LAD and RCA territory scar with minimal peri-infarct ischemia. LVEF 22% with akinesis of the previously mentioned walls. This is a high risk study. Compared to a prior study on 12/21/2012, there is no significant change.       04/09/2019; TTE IMPRESSIONS   1. Left ventricular ejection fraction, by estimation, is 35%%. The left  ventricle has moderate to severely decreased function. There is mild left  ventricular hypertrophy. Left ventricular diastolic parameters are  consistent with Grade I diastolic  dysfunction (impaired relaxation).   2. Right ventricular systolic function is normal. The right ventricular  size is normal.   3. The mitral valve is normal in structure and function. Trivial mitral  valve regurgitation.   4. The aortic valve is abnormal. Aortic valve regurgitation is trivial.  Mild aortic valve sclerosis is present, with no evidence of aortic valve  stenosis.   5. The inferior vena cava is normal in size with greater than 50%  respiratory variability, suggesting  right atrial pressure of 3 mmHg.   Patient Profile     75 y.o. male with a hx of CAD (s/p PCI to LAD 2001), ICM, chronic CHF (systolic), ICD, HTN, HLD, VT, AFib, AAA (s/p endovascular repair, Dr. Donzetta Matters), COPD, VT admitted with VT storm  Device information SJM dual chamber ICD,  Implanted 2012, gen change 03/18/2014 Has an abandoned RV lead (fracture), new lead placed 2018 Known A lead noise   04/21/2019 @ 9:31PM  VT episode This was a true MMVT, 203bpm, one ATP delivered failed, , one HV therapy, 25J with clean break ATP therapies were added 10/30/19: VT w/HV tx (pt preferred to hold off AAD), stress with only peri-infarct ischemia, not felt to need cath   06/01/20: VT storm 74 ATP  Therapies and 14 HV therapies All appropriate All were MMVT episodes, but did have two different VT's Largely ATP failed though a couple did work Also a couple ATPs that slowed his VT to below detection and labeled return to sinus AMIODARONE started Additional VT zone added 166bpm (With no anginal symptoms and recent stable stress test cath not pursued   10/11/20: recurrent VT (rates 130's) under detection Amiodarone IV >> reloaded PO ADDED mexiletine 1st VT zone rate reduced to 120bpm NID 100 and with 10 spins of ATP prior to shock  Assessment & Plan    VT storm On amio gtt (PO at home) Increase mexiletine to 300mg  BID Recurrent VT last nigh and early AM with several ATPs and eventual conversion, symptomatic with dizziness, no LOC  Yesterday device reprogrammed for more aggressive ATP schemes and NID lowered to 12  Planned for EPS/ablation Dr. Lovena Le has seen and examined the patient, discussed procedure, potential risks and benefits, the patient would like to proceed Unfortunately got breakfast very early, will look towards this afternoon   2. CAD Cath 10/12/20 with CAD as discussed Not felt to have good intervention targets, no plans for CABG Continue nitrate No CP     3. ICM 4. Chronic CHF  (systolic) Does not appear volume OL Will hold his entresto for now and see how his BP does Probably resume post procedure/tomorrow No SOB   5. Paroxysmal AFib CHA2DS2Vasc is 5 Continue xarelto, hold today   6. Itching? He mentions intermittent and random areas of itching No rash noted Unclear cause Follow  7. COPD Home regime   For questions or updates, please contact [place] Please consult [email or web address] for contact info under     Signed, Baldwin Jamaica, PA-C  10/26/2020, 8:45 AM    EP Attending  Patient seen and examined. Discussed with Dr. Greggory Brandy and Tommye Standard, PA-C. The patient has continued to have VT despite amiodarone IV and mexitil. His rates are in the 130's. Reviewe of the 12 lead ECG of the clinical VT suggests that the VT is coming from the LV inferior septum near but not quite at the apex. I have reviewed the indications/risks/benefits/goals/expectations of EP study and catheter ablation with the patient and he wishes to proceed with ablation. As he ate breakfast around 7:30, we will wait to perform the procedure until early afternoon.  Carleene Overlie Kambryn Dapolito,MD

## 2020-10-26 NOTE — Progress Notes (Addendum)
Approx [id number]  Assisted pt to [facility], stood/pivoted, gait strong.   0305: episode of VT while on commode, no LOC but felt dizzy. ATP from AICD x 4 with conversion to V paced.   Assisted back to bed.  0309: VT with ATP x 2  0310 VT with ATP x 1 to v paced/NSR  0314 VT with ATP x 5, lasted 1.5 min converted to NSR 60s.   No LOC but felt dizzy, denies chest pain. Initially hypertensive then SBP down to 130s.  Emotional support and education provided. Defib pads on and attached to zoll at bedside.   K = 4.2, Mg pending.   Addendum Mg = 2.1

## 2020-10-26 NOTE — Plan of Care (Signed)
Alert, oriented. 3 episodes of VT requiring multiple ATP by AICD, no defibrillation. Poor sleep despite melatonin po, expresses fear of falling asleep and heart going into bad rhythm. Remains on amio gtt and mexiletine po. K = 4.2, Mg = 2.1. Emotional support provided.   Problem: Education: Goal: Knowledge of General Education information will improve Description: Including pain rating scale, medication(s)/side effects and non-pharmacologic comfort measures Outcome: Progressing   Problem: Clinical Measurements: Goal: Ability to maintain clinical measurements within normal limits will improve Outcome: Progressing Goal: Will remain free from infection Outcome: Progressing Goal: Diagnostic test results will improve Outcome: Progressing Goal: Respiratory complications will improve Outcome: Progressing   Problem: Coping: Goal: Level of anxiety will decrease Outcome: Progressing   Problem: Elimination: Goal: Will not experience complications related to urinary retention Outcome: Progressing   Problem: Pain Managment: Goal: General experience of comfort will improve Outcome: Progressing   Problem: Safety: Goal: Ability to remain free from injury will improve Outcome: Progressing   Problem: Skin Integrity: Goal: Risk for impaired skin integrity will decrease Outcome: Progressing   Problem: Clinical Measurements: Goal: Cardiovascular complication will be avoided Outcome: Not Progressing   Problem: Activity: Goal: Risk for activity intolerance will decrease Outcome: Not Progressing

## 2020-10-26 NOTE — Progress Notes (Signed)
Site area: Right groin 6,6 and 7 french venous sheath was removed  Site Prior to Removal:  Level 0  Pressure Applied For 35 MINUTES    Bedrest Beginning at 1730pm X6 Hours  Manual:   Yes.    Patient Status During Pull:  stable  Post Pull Groin Site:  Level 0  Post Pull Instructions Given:  Yes.    Post Pull Pulses Present:  Yes.    Dressing Applied:  Yes.    Comments:

## 2020-10-26 NOTE — Progress Notes (Signed)
Site area: left groin fa sheath Site Prior to Removal:  Level 0 Pressure Applied For: 40 minutes Manual:   yes Patient Status During Pull:  stable Post Pull Site:  Level 0 Post Pull Instructions Given:  yes Post Pull Pulses Present: left pt palpable Dressing Applied:  gauze and tegaderm Bedrest begins @ 1730 Comments:

## 2020-10-27 ENCOUNTER — Encounter (HOSPITAL_COMMUNITY): Payer: Self-pay | Admitting: Internal Medicine

## 2020-10-27 DIAGNOSIS — I472 Ventricular tachycardia: Secondary | ICD-10-CM | POA: Diagnosis not present

## 2020-10-27 LAB — BASIC METABOLIC PANEL
Anion gap: 8 (ref 5–15)
BUN: 10 mg/dL (ref 8–23)
CO2: 22 mmol/L (ref 22–32)
Calcium: 8.5 mg/dL — ABNORMAL LOW (ref 8.9–10.3)
Chloride: 105 mmol/L (ref 98–111)
Creatinine, Ser: 0.94 mg/dL (ref 0.61–1.24)
GFR, Estimated: >60 mL/min (ref >60)
Glucose, Bld: 101 mg/dL — ABNORMAL HIGH (ref 70–99)
Potassium: 4.5 mmol/L (ref 3.5–5.1)
Sodium: 135 mmol/L (ref 135–145)

## 2020-10-27 MED ORDER — RIVAROXABAN 20 MG PO TABS
20.0000 mg | ORAL_TABLET | Freq: Every day | ORAL | Status: DC
  Administered 2020-10-27: 20 mg via ORAL
  Filled 2020-10-27: qty 1

## 2020-10-27 MED ORDER — AMIODARONE HCL 200 MG PO TABS
200.0000 mg | ORAL_TABLET | Freq: Two times a day (BID) | ORAL | Status: DC
  Administered 2020-10-27 – 2020-10-28 (×3): 200 mg via ORAL
  Filled 2020-10-27 (×3): qty 1

## 2020-10-27 MED ORDER — SACUBITRIL-VALSARTAN 97-103 MG PO TABS
1.0000 | ORAL_TABLET | Freq: Two times a day (BID) | ORAL | Status: DC
  Administered 2020-10-27 – 2020-10-28 (×3): 1 via ORAL
  Filled 2020-10-27 (×4): qty 1

## 2020-10-27 NOTE — Progress Notes (Addendum)
Progress Note  Patient Name: Benjamin Cook Date of Encounter: 10/27/2020  [facility] Cardiologist: Kirk Ruths, MD   Subjective   "I feel so much better!"  No groin pain, no CP, palpitations, no SOB  Inpatient Medications    Scheduled Meds:  atorvastatin  80 mg Oral QPM   carvedilol  6.25 mg Oral BID WC   Chlorhexidine Gluconate Cloth  6 each Topical Daily   empagliflozin  10 mg Oral Daily   furosemide  20 mg Oral Daily   isosorbide mononitrate  15 mg Oral Daily   mouth rinse  15 mL Mouth Rinse BID   melatonin  5 mg Oral QHS   mexiletine  300 mg Oral Q12H   mometasone-formoterol  2 puff Inhalation BID   optichamber diamond  1 each Other Once   pantoprazole  40 mg Oral Daily   potassium chloride SA  20 mEq Oral Daily   sodium chloride flush  3 mL Intravenous Q12H   spironolactone  25 mg Oral QHS   Continuous Infusions:  sodium chloride     sodium chloride     amiodarone 30 mg/hr (10/27/20 0439)   PRN Meds: sodium chloride, acetaminophen, nitroGLYCERIN, ondansetron (ZOFRAN) IV, sodium chloride flush   Vital Signs    Vitals:   10/27/20 0400 10/27/20 0500 10/27/20 0600 10/27/20 0700  BP: (!) 162/79 (!) 172/82 (!) 176/75 135/69  Pulse: 65 64 68 63  Resp: 20 (!) 22 17 19   Temp:      TempSrc:      SpO2: 98% 99% 98% 99%  Weight:   61.3 kg     Intake/Output Summary (Last 24 hours) at 10/27/2020 0749 Last data filed at 10/27/2020 0400 Gross per 24 hour  Intake 260.37 ml  Output 800 ml  Net -539.63 ml   Last 3 Weights 10/27/2020 10/26/2020 10/13/2020  Weight (lbs) 135 lb 2.3 oz 134 lb 14.7 oz 137 lb 2 oz  Weight (kg) 61.3 kg 61.2 kg 62.2 kg      Telemetry    SR,rare pacing, NO VT - Personally Reviewed  ECG    SR, RBBB, LAD - Personally Reviewed  Physical Exam   GEN: No acute distress, chronically ill appearing.   Neck: No JVD Cardiac: RRR, no murmurs, rubs, or gallops.  Respiratory: CTA b/l, not wheezing. GI: Soft, nontender, non-distended   MS: No edema; No deformity, advanced atrophy Neuro:  Nonfocal  Psych: Normal affect   B/l groins are soft, non tender, no hematoma/ecchymosis  Labs    High Sensitivity Troponin:   Recent Labs  Lab 10/25/20 1038 10/25/20 1228  TROPONINIHS 19* 22*     Chemistry Recent Labs  Lab 10/25/20 1038 10/26/20 0105 10/26/20 0153 10/27/20 0653  NA 137 137  --  135  K 3.7 4.2  --  4.5  CL 106 106  --  105  CO2 22 24  --  22  GLUCOSE 103* 107*  --  101*  BUN 9 10  --  10  CREATININE 1.22 1.06  --  0.94  CALCIUM 8.6* 8.6*  --  8.5*  MG 2.7*  --  2.1  --   GFRNONAA >60 >60  --  >60  ANIONGAP 9 7  --  8    Lipids No results for input(s): CHOL, TRIG, HDL, LABVLDL, LDLCALC, CHOLHDL in the last 168 hours.  Hematology Recent Labs  Lab 10/25/20 1038 10/26/20 2016  WBC 10.6*  --   RBC 4.46  --  HGB 14.2 12.6*  HCT 41.8 37.5*  MCV 93.7  --   MCH 31.8  --   MCHC 34.0  --   RDW 15.3  --   PLT 286  --    Thyroid No results for input(s): TSH, FREET4 in the last 168 hours.  BNPNo results for input(s): BNP, PROBNP in the last 168 hours.  DDimer No results for input(s): DDIMER in the last 168 hours.   Radiology     DG Chest Port 1 View Result Date: 10/25/2020 CLINICAL DATA:  Weakness. EXAM: PORTABLE CHEST 1 VIEW COMPARISON:  10/10/2020 FINDINGS: Left chest wall ICD noted with leads in the right ventricle and right atrial appendage. Aortic atherosclerotic calcifications. Normal heart size. No pleural effusion or edema. No airspace densities identified. IMPRESSION: No acute cardiopulmonary abnormalities. Aortic Atherosclerosis (ICD10-I70.0). Electronically Signed   By: Kerby Moors M.D.   On: 10/25/2020 10:31   Cardiac Studies   10/25/20: EPS/Ablation Conclusion: Successful EP study and catheter ablation of incessant sustained monomorphic ventricular tachycardia located in the left ventricular apical septum.  RF energy application resulted in termination of the tachycardia.  It was  no longer inducible.  10/12/20: LHC Mid Cx lesion is 70% stenosed.   Ost LM to Dist LM lesion is 75% stenosed.   Mid RCA lesion is 70% stenosed.   Prox RCA to Mid RCA lesion is 50% stenosed.   Previously placed Mid LAD stent (unknown type) is  widely patent. IMPRESSION: Mr. Pultz has 75% calcified distal left main.  His previously placed mid LAD stent is widely patent.  He does have a 70% mid AV groove circumflex and RCA disease.  I do not think he is an operative candidate.  It is certainly possible that this is contributory to his ventricular tachycardia.  Medical therapy he will be recommended.  The femoral sheath was removed and pressure was held on the groin to achieve hemostasis.  The patient left the lab in stable condition.  Dr. [name] was notified of these results.   11/23/2019: stress myoview Nuclear stress EF: 22%. No T wave inversion was noted during stress. There was no ST segment deviation noted during stress. Defect 1: There is a large defect of severe severity present in the basal inferior, mid anteroseptal, mid inferoseptal, mid inferior, apical septal, apical inferior, apical lateral and apex location. Findings consistent with prior myocardial infarction with peri-infarct ischemia. This is a high risk study. The left ventricular ejection fraction is severely decreased (<30%).   Large size, severe severity mostly fixed (SDS 3) apical to mid inferior, apical, apical septal and anteroapical perfusion defect suggestive of mid to distal LAD and RCA territory scar with minimal peri-infarct ischemia. LVEF 22% with akinesis of the previously mentioned walls. This is a high risk study. Compared to a prior study on 12/21/2012, there is no significant change.       04/09/2019; TTE IMPRESSIONS   1. Left ventricular ejection fraction, by estimation, is 35%%. The left  ventricle has moderate to severely decreased function. There is mild left  ventricular hypertrophy. Left ventricular  diastolic parameters are  consistent with Grade I diastolic  dysfunction (impaired relaxation).   2. Right ventricular systolic function is normal. The right ventricular  size is normal.   3. The mitral valve is normal in structure and function. Trivial mitral  valve regurgitation.   4. The aortic valve is abnormal. Aortic valve regurgitation is trivial.  Mild aortic valve sclerosis is present, with no evidence of aortic  valve  stenosis.   5. The inferior vena cava is normal in size with greater than 50%  respiratory variability, suggesting right atrial pressure of 3 mmHg.   Patient Profile     75 y.o. male with a hx of CAD (s/p PCI to LAD 2001), ICM, chronic CHF (systolic), ICD, HTN, HLD, VT, AFib, AAA (s/p endovascular repair, Dr. Donzetta Matters), COPD, VT admitted with VT storm  Device information SJM dual chamber ICD,  Implanted 2012, gen change 03/18/2014 Has an abandoned RV lead (fracture), new lead placed 2018 Known A lead noise   04/21/2019 @ 9:31PM VT episode This was a true MMVT, 203bpm, one ATP delivered failed, , one HV therapy, 25J with clean break ATP therapies were added 10/30/19: VT w/HV tx (pt preferred to hold off AAD), stress with only peri-infarct ischemia, not felt to need cath   06/01/20: VT storm 74 ATP  Therapies and 14 HV therapies All appropriate All were MMVT episodes, but did have two different VT's Largely ATP failed though a couple did work Also a couple ATPs that slowed his VT to below detection and labeled return to sinus AMIODARONE started Additional VT zone added 166bpm (With no anginal symptoms and recent stable stress test cath not pursued   10/11/20: recurrent VT (rates 130's) under detection Amiodarone IV >> reloaded PO ADDED mexiletine 1st VT zone rate reduced to 120bpm NID 100 and with 10 spins of ATP prior to shock  10/25/20 VT storm Mexiletine increased Device reprogrammed NID reduced to 12 and ATP schemes more aggressive VT ablation (Dr.  Lovena Le)  Assessment & Plan     Dr. Lovena Le has seen and examined the patient this AM  VT storm On amio gtt (PO at home) Increase mexiletine to 300mg  BID  Device reprogrammed for more aggressive ATP schemes and NID lowered to 12  S/p EPS/ablation yesterday with Dr. Lovena Le B/l groins are stable No VT post procedure  Transition to amiodarone 200mg  PO BID today Out of ICU   2. CAD Cath 10/12/20 with CAD as discussed Not felt to have good intervention targets, no plans for CABG Continue nitrate No CP     3. ICM 4. Chronic CHF (systolic) Does not appear volume OL Will hold his entresto for now and see how his BP does Resume home Entresto today No SOB    5. Paroxysmal AFib CHA2DS2Vasc is 5 Resume Xarelto this evening Calc CrCl is 60, will resume his home dose 20mg     6. Itching? He mentions intermittent and random areas of itching No rash noted Unclear cause None here Follow   7. COPD Home regime   For questions or updates, please contact [place] Please consult [email or web address] for contact info under     Signed, Baldwin Jamaica, PA-C  10/27/2020, 7:49 AM    EP Attending  Patient seen and examined. Agree with the findings as noted above. The patient is s/p VT ablation and doing well. No VT overnight. We will wean off the amio and transfer to tele bed. Hopefully DC home tomorrow. I would continue amio 200 bid and mexitil 300 bid for 6-8 weeks and then wean down as his arrhythmia tolerates.   Carleene Overlie Makayli Bracken,MD

## 2020-10-27 NOTE — Progress Notes (Signed)
Per report . Aroun 18:30 pt left groin started bleeding. Manual pressure was held, hemostasis achieved, and 6 hours bedrest added. Upon assessment today, bilateral groins level zero. Monitoring closely. Of note QTC 523. PA made aware. Pt around 0530, bp up to 170's so per report night shift RN gave pt's  0800 Coreg early around 0530. Monitoring closely, vss.  Lucius Conn, RN

## 2020-10-28 DIAGNOSIS — I472 Ventricular tachycardia: Secondary | ICD-10-CM | POA: Diagnosis not present

## 2020-10-28 LAB — BASIC METABOLIC PANEL
Anion gap: 7 (ref 5–15)
BUN: 10 mg/dL (ref 8–23)
CO2: 24 mmol/L (ref 22–32)
Calcium: 8.7 mg/dL — ABNORMAL LOW (ref 8.9–10.3)
Chloride: 103 mmol/L (ref 98–111)
Creatinine, Ser: 1.06 mg/dL (ref 0.61–1.24)
GFR, Estimated: >60 mL/min (ref >60)
Glucose, Bld: 104 mg/dL — ABNORMAL HIGH (ref 70–99)
Potassium: 3.7 mmol/L (ref 3.5–5.1)
Sodium: 134 mmol/L — ABNORMAL LOW (ref 135–145)

## 2020-10-28 MED ORDER — MEXILETINE HCL 150 MG PO CAPS: 300.0000 mg | ORAL_CAPSULE | Freq: Two times a day (BID) | ORAL | 2 refills | Status: DC

## 2020-10-28 MED ORDER — AMIODARONE HCL 200 MG PO TABS: 200.0000 mg | ORAL_TABLET | Freq: Two times a day (BID) | ORAL | 2 refills | Status: DC

## 2020-10-28 NOTE — TOC Transition Note (Signed)
Transition of Care [facility]) - CM/SW Discharge Note   Patient Details  Name: Benjamin Cook MRN: [id number] Date of Birth: 01-May-1945  [facility] of Care [facility] CM/SW Contact:  Pollie Friar, RN Phone Number: 10/28/2020, 10:21 AM   Clinical Narrative:    Patient discharging home with self care. No needs per TOC.   Final next level of care: Home/Self Care Barriers to Discharge: No Barriers Identified   Patient Goals and CMS Choice        Discharge Placement                       Discharge Plan and Services                                     Social Determinants of Health (SDOH) Interventions     Readmission Risk Interventions No flowsheet data found.

## 2020-10-28 NOTE — Discharge Summary (Addendum)
Discharge Summary    Patient ID: Benjamin Cook MRN: [id number]; DOB: 20-Apr-1945  Admit date: 10/25/2020 Discharge date: 10/28/2020  PCP:  Merryl Hacker No   [facility] Providers Cardiologist:  Kirk Ruths, MD  Electrophysiologist:  Thompson Grayer, MD  Electrophysiology APP:  Patsey Berthold, NP      Discharge Diagnoses    Principal Problem:   Ventricular tachycardia [facility]) Active Problems:   HFrEF (heart failure with reduced ejection fraction) ([place])   ICD (implantable cardioverter-defibrillator) in place   Systolic CHF [facility])   AAA (abdominal aortic aneurysm) [facility])   Defibrillator discharge  Diagnostic Studies/Procedures    VT ablation 10/26/20:  Conclusion: Successful EP study and catheter ablation of incessant sustained monomorphic ventricular tachycardia located in the left ventricular apical septum.  RF energy application resulted in termination of the tachycardia.  It was no longer inducible.   History of Present Illness     Benjamin Cook is a 75 y.o. male with history of CAD s/p PCI to LAD 2001, ICM s/p ICD, chronic systolic CHF, paroxysmal atrial fibrillation, HTN, HLD, AAA  s/p endovascular repair per Dr. Donzetta Matters, COPD, and recurrent VT who presented to [facility] 10/25/2020 for the evaluation of ventricular tachycardia.  Of note, patient was initially seen for abdominal complaints felt to be related to his hydralazine however on ED presentation he was found to have VT.  He was started on mexiletine and his device was reprogrammed to address slower VT rates.  He was initially treated with amiodarone infusion and transition back to p.o. dosing.  LHC showed CAD though not felt to be a CABG candidate.  GI consulted for abdominal pain who felt he is not a candidate for endoscopic procedures and recommended continuation of Protonix along with outpatient follow-up.  He was discharged 10/16/2020.  He then returned after an outpatient ICD shock at home.  On ED presentation, he was observed  to have several ATP therapies which finally converted the patient to sinus rhythm.  He was started on IV amiodarone bolus plus infusion and received 2 g magnesium.  Patient reportedly had persistent dizziness and weakness after previous discharge.  Device interrogated which showed 54 VT episodes and 54 ATPs therefore he was admitted for VT storm by EP team.  Hospital Course    On ED evaluation, his mexiletine was initially increased to 300 mg twice daily.  Unfortunately he continued to have several more episodes of recurrent VT with ATP's and eventual conversion.  He was symptomatic with dizziness however no LOC. Device reprogrammed for more aggressive ATP schemes and NID lowered to 12.  Plan was for EPS/ablation which was performed 10/26/2020 with Dr. Lovena Le.  He had no recurrent VT in the postprocedural setting.  He was eventually transitioned to p.o. amiodarone 200 mg twice daily. Groin sites remained stable.  Hospital problems as below:   VT storm s/p ablation: -As above, patient presented after several ICD shocks at which time his device was interrogated and reprogrammed.  Initially placed on IV amiodarone with bolus along with increased dosing of mexiletine at 300mg  BID.  The patient underwent EPS/ablation with Dr. Lovena Le 10/26/2020 with no recurrent VT on telemetry.  He was eventually transitioned to p.o. dosing of amiodarone at 200 mg p.o. twice daily which he will continue.  CAD: -LHC 10/12/20 with CAD as discussed however not felt to be a good candidate for PCI with no plans for CABG.  Recommendations are to continue nitrates.  He has been chest pain-free.  ICM/chronic systolic CHF: -Remains euvolemic.  Home Entresto and Lasix restarted.  He has had no shortness of breath or other evidence of fluid volume overload.   Paroxysmal AFib: -CHA2DS2Vasc is 5 -Continue Xarelto -No evidence of bleeding   Consultants: None  The patient has been seen and examined by Dr. Quentin Ore who feels that he  is stable and ready for discharge today, 10/28/2020.  Follow-up with Dr. Rayann Heman 11/06/2020  Did the patient have an acute coronary syndrome (MI, NSTEMI, STEMI, etc) this admission?:  No                               Did the patient have a percutaneous coronary intervention (stent / angioplasty)?:  No.   _____________  Discharge Vitals Blood pressure 135/73, pulse 73, temperature 98.9 F (37.2 C), temperature source Oral, resp. rate 18, height 5\' 10"  (1.778 m), weight 59.2 kg, SpO2 90 %.  Filed Weights   10/27/20 0600 10/27/20 1645 10/28/20 0342  Weight: 61.3 kg 59.7 kg 59.2 kg   Labs & Radiologic Studies    CBC Recent Labs    10/25/20 1038 10/26/20 2016  WBC 10.6*  --   HGB 14.2 12.6*  HCT 41.8 37.5*  MCV 93.7  --   PLT 286  --    Basic Metabolic Panel Recent Labs    10/25/20 1038 10/26/20 0105 10/26/20 0153 10/27/20 0653 10/28/20 0140  NA 137   < >  --  135 134*  K 3.7   < >  --  4.5 3.7  CL 106   < >  --  105 103  CO2 22   < >  --  22 24  GLUCOSE 103*   < >  --  101* 104*  BUN 9   < >  --  10 10  CREATININE 1.22   < >  --  0.94 1.06  CALCIUM 8.6*   < >  --  8.5* 8.7*  MG 2.7*  --  2.1  --   --    < > = values in this interval not displayed.   Liver Function Tests No results for input(s): AST, ALT, ALKPHOS, BILITOT, PROT, ALBUMIN in the last 72 hours. No results for input(s): LIPASE, AMYLASE in the last 72 hours. High Sensitivity Troponin:   Recent Labs  Lab 10/25/20 1038 10/25/20 1228  TROPONINIHS 19* 22*    BNP Invalid input(s): POCBNP D-Dimer No results for input(s): DDIMER in the last 72 hours. Hemoglobin A1C No results for input(s): HGBA1C in the last 72 hours. Fasting Lipid Panel No results for input(s): CHOL, HDL, LDLCALC, TRIG, CHOLHDL, LDLDIRECT in the last 72 hours. Thyroid Function Tests No results for input(s): TSH, T4TOTAL, T3FREE, THYROIDAB in the last 72 hours.  Invalid input(s): FREET3 _____________  CT ABDOMEN PELVIS WO  CONTRAST  Result Date: 10/14/2020 CLINICAL DATA:  Acute, non localized abdominal pain. EXAM: CT ABDOMEN AND PELVIS WITHOUT CONTRAST TECHNIQUE: Multidetector CT imaging of the abdomen and pelvis was performed following the standard protocol without IV contrast. COMPARISON:  10/10/2020 FINDINGS: Lower chest: No significant change in a small pericardial effusion with a maximum thickness of 1.0 cm. Stable pacemaker and AICD leads. Interval minimal bilateral pleural fluid and patchy density in the left lower lobe with moderate to marked bilateral peribronchial thickening with progression. Hepatobiliary: No focal liver abnormality is seen. No gallstones, gallbladder wall thickening, or biliary dilatation. Pancreas: Unremarkable. No pancreatic ductal dilatation  or surrounding inflammatory changes. Spleen: Normal in size without focal abnormality. Adrenals/Urinary Tract: A heterogeneous right adrenal mass is again demonstrated measuring 4.3 x 3.3 cm on image number 17/3. Normal appearing left adrenal gland. Stable bilateral renal cysts. Unremarkable ureters and urinary bladder. Stomach/Bowel: Multiple descending and sigmoid colon diverticula without evidence of diverticulitis. Normal appearing appendix. Small hiatal hernia.  Unremarkable small bowel. Vascular/Lymphatic: Stable aorto bi-iliac bypass stent graft within a thrombosed infrarenal abdominal aorta with maximum dimensions of 4.7 cm on image number 37/3. Atheromatous arterial calcifications. No enlarged lymph nodes. Reproductive: Markedly enlarged prostate gland protruding into the base of the urinary bladder. Other: Small to moderate-sized left inguinal hernia containing fat and a short segment of a loop of distal small bowel without bowel obstruction. Small right inguinal hernia containing fat. Musculoskeletal: Lumbar and lower thoracic spine degenerative changes. IMPRESSION: 1. No acute abnormality. 2. Stable right adrenal mass, previously shown to be stable  since 10/14/2003, compatible with a benign process. 3. Stable marked prostatic hypertrophy. 4. Small to moderate-sized left inguinal hernia containing fat and a short segment of a loop of distal small bowel without obstruction. 5. Colonic diverticulosis, small hiatal hernia and small pericardial effusion, without significant change. Electronically Signed   By: Claudie Revering M.D.   On: 10/14/2020 17:15   CT ABDOMEN PELVIS W CONTRAST  Result Date: 10/10/2020 CLINICAL DATA:  Abdominal pain, acute, nonlocalized EXAM: CT ABDOMEN AND PELVIS WITH CONTRAST TECHNIQUE: Multidetector CT imaging of the abdomen and pelvis was performed using the standard protocol following bolus administration of intravenous contrast. CONTRAST:  100 cc Omnipaque 300 COMPARISON:  04/04/2017 FINDINGS: Lower chest: There is stable left lower lobe bronchiectasis and mucoid impaction with associated parenchymal scarring and volume loss. No pleural effusion. Pacemaker leads are seen within the right heart. Left ventricular dilation is noted with stable thinning of the septal and apical wall in keeping with prior myocardial infarction. Trace pericardial effusion. Hepatobiliary: No focal liver abnormality is seen. No gallstones, gallbladder wall thickening, or biliary dilatation. Pancreas: Unremarkable Spleen: Unremarkable Adrenals/Urinary Tract: Stable 3.3 x 4.4 cm heterogeneously enhancing right adrenal mass, stable since remote prior examination of 10/14/2003, likely representing a benign adrenal adenoma or myelolipoma. Left adrenal gland is unremarkable. The kidneys are normal in size and position. Scattered simple cortical cysts are seen within the kidneys bilaterally. The kidneys are otherwise unremarkable. The prostate gland is centrally enlarged and protrudes into the bladder lumen. The bladder is not distended. Stomach/Bowel: Moderate sigmoid diverticulosis. Unremarkable loops of small bowel are seen within small bilateral inguinal hernias.  The stomach, small bowel, and large bowel are otherwise unremarkable. Appendix normal. No free intraperitoneal gas or fluid. Vascular/Lymphatic: Short segment focal dissection of the a suprarenal aorta is again identified with a patent false lumen noted posteriorly. No evidence of rupture or periaortic inflammatory change. Infrarenal abdominal aortic aneurysm has undergone endovascular repair utilizing a bifurcated stent graft demonstrating infrarenal proximal fixation and bilateral common iliac distal landing zones. Small mural thrombus is again identified within the a left common iliac limb, unchanged, without evidence of hemodynamically significant stenosis. The aneurysm sac is unchanged measuring 4.7 x 5.0 cm at axial image # 36/3. Extensive atherosclerotic calcification noted within the a mesenteric and renal vasculature, however, the degree of stenosis is not well assessed on this examination. 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 No pathologic adenopathy within the abdomen and pelvis. Reproductive: There is marked prostatic enlargement, particularly centrally which protrudes into the bladder lumen.  This appears similar to prior examination. Other: Small bilateral inguinal hernias are present. Rectum unremarkable. Musculoskeletal: Degenerative changes are seen within the lumbar spine. No lytic or blastic bone lesions are identified. IMPRESSION: No acute intra-abdominal pathology identified. No definite radiographic explanation for the patient's reported symptoms. Stable left basilar bronchiectasis with superimposed mucoid impaction and volume loss. Stable 4.4 cm right adrenal mass since remote prior examination of 10/14/2003 likely representing a benign adrenal adenoma or myelolipoma. Moderate sigmoid diverticulosis. Stable appearance of the abdominal aorta demonstrating a short segment focal dissection within the suprarenal segment, endovascular repair of a abdominal aortic aneurysm  measuring 5.0 cm in greatest dimension, stable, as well as 2.2 cm right common iliac artery aneurysm, unchanged. Marked prostatic enlargement without evidence of bladder outlet obstruction. Small bilateral inguinal hernias containing small bowel without evidence of obstruction. Aortic aneurysm NOS (ICD10-I71.9). Aortic Atherosclerosis (ICD10-I70.0). Electronically Signed   By: Fidela Salisbury M.D.   On: 10/10/2020 23:21   CARDIAC CATHETERIZATION  Result Date: 10/12/2020   Mid Cx lesion is 70% stenosed.   Ost LM to Dist LM lesion is 75% stenosed.   Mid RCA lesion is 70% stenosed.   Prox RCA to Mid RCA lesion is 50% stenosed.   Previously placed Mid LAD stent (unknown type) is  widely patent. Benjamin Cook is a 75 y.o. male  [id number] LOCATION:  FACILITY: [facility] PHYSICIAN: Quay Burow, M.D. [birth year] DATE OF PROCEDURE:  10/12/2020 DATE OF DISCHARGE: CARDIAC CATHETERIZATION History obtained from chart review.75 y.o. male with past medical history of ischemic cardiomyopathy, chronic systolic congestive heart failure, prior ventricular tachycardia, prior ICD, paroxysmal atrial fibrillation, hypertension, hyperlipidemia, previous abdominal aortic aneurysm repair, COPD admitted with recurrent ventricular tachycardia.  He has a history of LAD stenting back in 2001.  He has LV dysfunction with a prior Myoview that showed inferoapical apical and anteroapical scar.  He was admitted with ventricular tachycardia.  He has a history of A. fib as well.  He was on amiodarone and lidocaine and converted to sinus rhythm.  Plans were to convert to mexiletine instead of lidocaine.  We were asked to perform coronary angiography to determine if there was an ischemic etiology. PROCEDURE DESCRIPTION: The patient was brought to the second floor [facility] cath lab in the postabsorptive state. He was premedicated with IV Versed and fentanyl. His right groinwas prepped and shaved in usual sterile fashion. Xylocaine 1% was used for  local anesthesia. A 5 French sheath was inserted into the right common femoral  artery using standard Seldinger technique.  I initially try to access the right radial artery under ultrasound guidance unsuccessfully.  5 French right left Judkins diagnostic catheters were used for selective coronary angiography.  Catheter exchanges were performed over a 260 cm 035 J-wire.  Isovue dye was used for the entirety of the case (55 cc of contrast administered to the patient).  Retrograde aortic pressures monitored in the case.   Mr. Centrella has 75% calcified distal left main.  His previously placed mid LAD stent is widely patent.  He does have a 70% mid AV groove circumflex and RCA disease.  I do not think he is an operative candidate.  It is certainly possible that this is contributory to his ventricular tachycardia.  Medical therapy he will be recommended.  The femoral sheath was removed and pressure was held on the groin to achieve hemostasis.  The patient left the lab in stable condition.  Dr. [name] was notified of these results. Quay Burow.  MD, [facility] 10/12/2020 9:41 AM    EP STUDY  Result Date: 10/26/2020 Conclusion: Successful EP study and catheter ablation of incessant sustained monomorphic ventricular tachycardia located in the left ventricular apical septum.  RF energy application resulted in termination of the tachycardia.  It was no longer inducible. Cristopher Peru, MD   DG Chest Port 1 View  Result Date: 10/25/2020 CLINICAL DATA:  Weakness. EXAM: PORTABLE CHEST 1 VIEW COMPARISON:  10/10/2020 FINDINGS: Left chest wall ICD noted with leads in the right ventricle and right atrial appendage. Aortic atherosclerotic calcifications. Normal heart size. No pleural effusion or edema. No airspace densities identified. IMPRESSION: No acute cardiopulmonary abnormalities. Aortic Atherosclerosis (ICD10-I70.0). Electronically Signed   By: Kerby Moors M.D.   On: 10/25/2020 10:31   DG Chest Portable 1  View  Result Date: 10/10/2020 CLINICAL DATA:  Weakness.  Abdominal pain and vomiting. EXAM: PORTABLE CHEST 1 VIEW COMPARISON:  06/01/2020, remote CT 06/03/2007 FINDINGS: Multi lead left-sided pacemaker in place. The heart is normal in size. Aortic atherosclerosis. The lungs are hyperinflated. Central bronchial thickening. No emphysema seen on prior CT. Streaky left lung base scarring. No acute airspace disease. No pulmonary edema. No pleural effusion or pneumothorax. No visualized pulmonary mass or nodule. No acute osseous abnormalities are seen. IMPRESSION: Hyperinflation and central bronchial thickening, imaging findings suggesting COPD. No focal airspace disease. Electronically Signed   By: Keith Rake M.D.   On: 10/10/2020 21:19   ECHOCARDIOGRAM COMPLETE  Result Date: 10/11/2020    ECHOCARDIOGRAM REPORT   Patient Name:   Benjamin Cook Date of Exam: 10/11/2020 Medical Rec #:  [id number]         Height:       70.0 in Accession #:    [id number]        Weight:       138.9 lb Date of Birth:  03-17-1945         BSA:          1.788 m Patient Age:    17 years          BP:           164/77 mmHg Patient Gender: M                 HR:           59 bpm. Exam Location:  Inpatient Procedure: 2D Echo, Cardiac Doppler, Color Doppler and Intracardiac            Opacification Agent Indications:    Ventricular tachycardia  History:        Patient has prior history of Echocardiogram examinations, most                 recent 04/09/2019. CHF and Cardiomyopathy, CAD, Defibrillator,                 COPD and PVD, Arrythmias:Vtach and Atrial Fibrillation,                 Signs/Symptoms:Chest Pain; Risk Factors:Former Smoker.  Sonographer:    Dustin Flock RDCS Referring Phys: [id number] [place]  1. Left ventricular ejection fraction, by estimation, is 30 to 35%. The left ventricle has moderately decreased function. The left ventricle has no regional wall motion abnormalities. There is mild concentric left  ventricular hypertrophy. Left ventricular diastolic parameters are consistent with Grade I diastolic dysfunction (impaired relaxation). Elevated left ventricular end-diastolic pressure. There is akinesis of the left ventricular, mid inferoseptal wall and anteroseptal wall.  There is akinesis of the left ventricular, apical septal wall, inferior wall and apical segment. There is severe hypokinesis of the left ventricular, mid-apical anterior wall.  2. Right ventricular systolic function is normal. The right ventricular size is normal. There is normal pulmonary artery systolic pressure. The estimated right ventricular systolic pressure is 47.8 mmHg.  3. The mitral valve is normal in structure. Trivial mitral valve regurgitation. No evidence of mitral stenosis.  4. The aortic valve is calcified. Aortic valve regurgitation is not visualized. Mild to moderate aortic valve sclerosis/calcification is present, without any evidence of aortic stenosis.  5. The inferior vena cava is normal in size with greater than 50% respiratory variability, suggesting right atrial pressure of 3 mmHg.  6. There is very sluggish flow in the apical LV which increases risk of apical LV thrombus formation. FINDINGS  Left Ventricle: Left ventricular ejection fraction, by estimation, is 30 to 35%. The left ventricle has moderately decreased function. The left ventricle has no regional wall motion abnormalities. Severe hypokinesis of the left ventricular, mid-apical anterior wall. Definity contrast agent was given IV to delineate the left ventricular endocardial borders. The left ventricular internal cavity size was normal in size. There is mild concentric left ventricular hypertrophy. Left ventricular diastolic parameters are consistent with Grade I diastolic dysfunction (impaired relaxation). Elevated left ventricular end-diastolic pressure. Right Ventricle: The right ventricular size is normal. No increase in right ventricular wall thickness.  Right ventricular systolic function is normal. There is normal pulmonary artery systolic pressure. The tricuspid regurgitant velocity is 2.67 m/s, and  with an assumed right atrial pressure of 3 mmHg, the estimated right ventricular systolic pressure is 29.5 mmHg. Left Atrium: Left atrial size was normal in size. Right Atrium: Right atrial size was normal in size. Pericardium: There is no evidence of pericardial effusion. Mitral Valve: The mitral valve is normal in structure. Trivial mitral valve regurgitation. No evidence of mitral valve stenosis. Tricuspid Valve: The tricuspid valve is normal in structure. Tricuspid valve regurgitation is trivial. No evidence of tricuspid stenosis. Aortic Valve: The aortic valve is calcified. Aortic valve regurgitation is not visualized. Mild to moderate aortic valve sclerosis/calcification is present, without any evidence of aortic stenosis. Pulmonic Valve: The pulmonic valve was normal in structure. Pulmonic valve regurgitation is not visualized. No evidence of pulmonic stenosis. Aorta: The aortic root is normal in size and structure. Venous: The inferior vena cava is normal in size with greater than 50% respiratory variability, suggesting right atrial pressure of 3 mmHg. IAS/Shunts: No atrial level shunt detected by color flow Doppler. Additional Comments: A device lead is visualized.  LEFT VENTRICLE PLAX 2D LVIDd:         5.10 cm  Diastology LVIDs:         4.50 cm  LV e' medial:    4.46 cm/s LV PW:         1.30 cm  LV E/e' medial:  14.0 LV IVS:        1.30 cm  LV e' lateral:   4.24 cm/s LVOT diam:     2.00 cm  LV E/e' lateral: 14.7 LV SV:         60 LV SV Index:   34 LVOT Area:     3.14 cm  RIGHT VENTRICLE RV Basal diam:  2.70 cm RV S prime:     10.40 cm/s TAPSE (M-mode): 2.1 cm LEFT ATRIUM             Index  RIGHT ATRIUM           Index LA diam:        3.30 cm 1.85 cm/m  RA Area:     13.80 cm LA Vol (A2C):   35.5 ml 19.86 ml/m RA Volume:   37.60 ml  21.03 ml/m LA  Vol (A4C):   29.6 ml 16.56 ml/m LA Biplane Vol: 35.7 ml 19.97 ml/m  AORTIC VALVE LVOT Vmax:   101.00 cm/s LVOT Vmean:  55.900 cm/s LVOT VTI:    0.192 m  AORTA Ao Root diam: 2.90 cm MITRAL VALVE               TRICUSPID VALVE MV Area (PHT): 3.08 cm    TR Peak grad:   28.5 mmHg MV Decel Time: 246 msec    TR Vmax:        267.00 cm/s MV E velocity: 62.40 cm/s MV A velocity: 73.30 cm/s  SHUNTS MV E/A ratio:  0.85        Systemic VTI:  0.19 m                            Systemic Diam: 2.00 cm Fransico Him MD Electronically signed by Fransico Him MD Signature Date/Time: 10/11/2020/3:22:47 PM    Final    CUP PACEART REMOTE DEVICE CHECK  Result Date: 10/26/2020 Scheduled remote reviewed. Normal device function.  Presenting rhythm is sustained VT, pt. is currently in the ED There have been numerous sustained VT events with ATP, one HV event 9/21 @ 07:05.  Events show only temporary resolution.  Next remote to be determined Route to triage for awareness. LR  Disposition   Pt is being discharged home today in good condition.  Follow-up Plans & Appointments    Follow-up Information     Thompson Grayer, MD. Go on 11/06/2020.   Specialty: Cardiology Why: at 4:15pm. Please arrive by 4:00pm Contact information: [address] [place] [address] [phone]                Discharge Instructions     Call MD for:  difficulty breathing, headache or visual disturbances   Complete by: As directed    Call MD for:  extreme fatigue   Complete by: As directed    Call MD for:  hives   Complete by: As directed    Call MD for:  persistant dizziness or light-headedness   Complete by: As directed    Call MD for:  persistant nausea and vomiting   Complete by: As directed    Call MD for:  redness, tenderness, or signs of infection (pain, swelling, redness, odor or green/yellow discharge around incision site)   Complete by: As directed    Call MD for:  severe uncontrolled pain   Complete by: As  directed    Call MD for:  temperature >100.4   Complete by: As directed    Diet - low sodium heart healthy   Complete by: As directed    Increase activity slowly   Complete by: As directed       Discharge Medications   Allergies as of 10/28/2020   No Known Allergies      Medication List     STOP taking these medications    hydrALAZINE 25 MG tablet Commonly known as: APRESOLINE       TAKE these medications    acetaminophen 500 MG tablet Commonly known as: TYLENOL Take  500-1,000 mg by mouth every 8 (eight) hours as needed (for pain).   amiodarone 200 MG tablet Commonly known as: PACERONE Take 1 tablet (200 mg total) by mouth 2 (two) times daily. What changed:  how much to take how to take this when to take this additional instructions   atorvastatin 80 MG tablet Commonly known as: LIPITOR TAKE 1 TABLET BY MOUTH  DAILY AT 6 PM What changed:  how much to take how to take this when to take this additional instructions   carvedilol 6.25 MG tablet Commonly known as: COREG Take 1 tablet (6.25 mg total) by mouth 2 (two) times daily with a meal.   Dulera 100-5 MCG/ACT Aero Generic drug: mometasone-formoterol Inhale 2 puffs into the lungs in the morning and at bedtime.   feeding supplement Liqd Try to drink 1 can 1-2 times a day. What changed:  how much to take how to take this when to take this additional instructions   Fluticasone-Salmeterol 100-50 MCG/DOSE Aepb Commonly known as: ADVAIR Inhale 1 puff into the lungs 2 (two) times daily.   furosemide 40 MG tablet Commonly known as: LASIX Take 0.5 tablets (20 mg total) by mouth daily.   isosorbide mononitrate 30 MG 24 hr tablet Commonly known as: IMDUR Take 0.5 tablets (15 mg total) by mouth daily.   Jardiance 10 MG Tabs tablet Generic drug: empagliflozin Take 1 tablet (10 mg total) by mouth daily.   melatonin 5 MG Tabs Take 1 tablet (5 mg total) by mouth at bedtime.   mexiletine 150 MG  capsule Commonly known as: MEXITIL Take 2 capsules (300 mg total) by mouth every 12 (twelve) hours. What changed:  medication strength how much to take   multivitamin capsule Take 1 capsule by mouth daily.   nitroGLYCERIN 0.4 MG SL tablet Commonly known as: NITROSTAT Place 1 tablet (0.4 mg total) under the tongue every 5 (five) minutes x 3 doses as needed for chest pain.   NYQUIL PO Take 1 Dose by mouth at bedtime as needed (sleep). Heart healty nyquil   pantoprazole 40 MG tablet Commonly known as: Protonix Take 1 tablet (40 mg total) by mouth daily.   potassium chloride SA 20 MEQ tablet Commonly known as: KLOR-CON TAKE 1 TABLET BY MOUTH  DAILY   rivaroxaban 20 MG Tabs tablet Commonly known as: Xarelto Take 1 tablet (20 mg total) by mouth daily with supper. What changed: when to take this   sacubitril-valsartan 97-103 MG Commonly known as: ENTRESTO Take 1 tablet by mouth 2 (two) times daily.   spironolactone 25 MG tablet Commonly known as: ALDACTONE Take 1 tablet (25 mg total) by mouth at bedtime.        Outstanding Labs/Studies   None   Duration of Discharge Encounter   Greater than 30 minutes including physician time.  Signed, Kathyrn Drown, NP 10/28/2020, 9:52 AM

## 2020-10-28 NOTE — Progress Notes (Signed)
Progress Note  Patient Name: Benjamin Cook Date of Encounter: 10/28/2020  [facility] Cardiologist: Kirk Ruths, MD   Subjective   NAEO. No VT.  Inpatient Medications    Scheduled Meds:  amiodarone  200 mg Oral BID   atorvastatin  80 mg Oral QPM   carvedilol  6.25 mg Oral BID WC   Chlorhexidine Gluconate Cloth  6 each Topical Daily   empagliflozin  10 mg Oral Daily   furosemide  20 mg Oral Daily   isosorbide mononitrate  15 mg Oral Daily   mouth rinse  15 mL Mouth Rinse BID   melatonin  5 mg Oral QHS   mexiletine  300 mg Oral Q12H   mometasone-formoterol  2 puff Inhalation BID   optichamber diamond  1 each Other Once   pantoprazole  40 mg Oral Daily   potassium chloride SA  20 mEq Oral Daily   rivaroxaban  20 mg Oral Q supper   sacubitril-valsartan  1 tablet Oral BID   sodium chloride flush  3 mL Intravenous Q12H   spironolactone  25 mg Oral QHS   Continuous Infusions:  sodium chloride     sodium chloride     PRN Meds: sodium chloride, acetaminophen, nitroGLYCERIN, ondansetron (ZOFRAN) IV, sodium chloride flush   Vital Signs    Vitals:   10/27/20 1606 10/27/20 1645 10/27/20 2017 10/28/20 0342  BP:  (!) 149/75 138/74 135/73  Pulse:  67 73 73  Resp:  20 (!) 22 19  Temp: 97.8 F (36.6 C) 98.5 F (36.9 C) 98.1 F (36.7 C) 98.9 F (37.2 C)  TempSrc: Oral Oral Oral Oral  SpO2:  95% 92% 92%  Weight:  59.7 kg  59.2 kg  Height:  5\' 10"  (1.778 m)      Intake/Output Summary (Last 24 hours) at 10/28/2020 0825 Last data filed at 10/28/2020 0347 Gross per 24 hour  Intake 404.37 ml  Output 1350 ml  Net -945.63 ml    Last 3 Weights 10/28/2020 10/27/2020 10/27/2020  Weight (lbs) 130 lb 9.6 oz 131 lb 9.8 oz 135 lb 2.3 oz  Weight (kg) 59.24 kg 59.7 kg 61.3 kg      Telemetry    SR, NO VT - Personally Reviewed  ECG    Physical Exam   GEN: No acute distress, chronically ill appearing.   Neck: No JVD Cardiac: RRR, no murmurs, rubs, or gallops.   Respiratory: CTA b/l, not wheezing. GI: Soft, nontender, non-distended  MS: No edema; No deformity, advanced atrophy Neuro:  Nonfocal  Psych: Normal affect     Labs    High Sensitivity Troponin:   Recent Labs  Lab 10/25/20 1038 10/25/20 1228  TROPONINIHS 19* 22*      Chemistry Recent Labs  Lab 10/25/20 1038 10/26/20 0105 10/26/20 0153 10/27/20 0653 10/28/20 0140  NA 137 137  --  135 134*  K 3.7 4.2  --  4.5 3.7  CL 106 106  --  105 103  CO2 22 24  --  22 24  GLUCOSE 103* 107*  --  101* 104*  BUN 9 10  --  10 10  CREATININE 1.22 1.06  --  0.94 1.06  CALCIUM 8.6* 8.6*  --  8.5* 8.7*  MG 2.7*  --  2.1  --   --   GFRNONAA >60 >60  --  >60 >60  ANIONGAP 9 7  --  8 7     Lipids No results for input(s): CHOL, TRIG, HDL,  LABVLDL, LDLCALC, CHOLHDL in the last 168 hours.  Hematology Recent Labs  Lab 10/25/20 1038 10/26/20 2016  WBC 10.6*  --   RBC 4.46  --   HGB 14.2 12.6*  HCT 41.8 37.5*  MCV 93.7  --   MCH 31.8  --   MCHC 34.0  --   RDW 15.3  --   PLT 286  --     Thyroid No results for input(s): TSH, FREET4 in the last 168 hours.  BNPNo results for input(s): BNP, PROBNP in the last 168 hours.  DDimer No results for input(s): DDIMER in the last 168 hours.   Radiology     DG Chest Port 1 View Result Date: 10/25/2020 CLINICAL DATA:  Weakness. EXAM: PORTABLE CHEST 1 VIEW COMPARISON:  10/10/2020 FINDINGS: Left chest wall ICD noted with leads in the right ventricle and right atrial appendage. Aortic atherosclerotic calcifications. Normal heart size. No pleural effusion or edema. No airspace densities identified. IMPRESSION: No acute cardiopulmonary abnormalities. Aortic Atherosclerosis (ICD10-I70.0). Electronically Signed   By: Kerby Moors M.D.   On: 10/25/2020 10:31   Cardiac Studies   No new  Patient Profile     75 y.o. male with a hx of CAD (s/p PCI to LAD 2001), ICM, chronic CHF (systolic), ICD, HTN, HLD, VT, AFib, AAA (s/p endovascular repair, Dr.  Donzetta Matters), COPD, VT admitted with VT storm    VT storm Amiodarone 200mg  PO BID Increase mexiletine to 300mg  BID Device reprogrammed for more aggressive ATP schemes and NID lowered to 12   2. CAD Cath 10/12/20 with CAD as discussed Not felt to have good intervention targets, no plans for CABG Continue nitrate No CP   3. ICM 4. Chronic CHF (systolic) Does not appear volume OL entresto Resume home Entresto today No SOB    5. Paroxysmal AFib CHA2DS2Vasc is 5 xarelto   OK to discharge today.    For questions or updates, please contact [place] Please consult [email or web address] for contact info under     Signed, Vickie Epley, MD  10/28/2020, 8:25 AM

## 2020-10-30 ENCOUNTER — Other Ambulatory Visit (HOSPITAL_COMMUNITY): Payer: Self-pay

## 2020-10-30 ENCOUNTER — Telehealth: Payer: Self-pay | Admitting: Cardiology

## 2020-10-30 NOTE — Telephone Encounter (Signed)
Spoke with the pts wife and she reports the pts dose Entresto was increased in the hospital to 97-103. She has been doubling his dose by the D/C provider... but she is asking how to change the dose with the pt assistance program. I advised that I will forward to our nurse that takes care of our pt assistance to see if she needs to sign any other forms... if so she can do that that at his ICD check 11/06/20 with Dr. Rayann Heman.

## 2020-10-30 NOTE — Telephone Encounter (Signed)
Pt c/o medication issue:  1. Name of Medication: sacubitril-valsartan (ENTRESTO) 97-103 MG  2. How are you currently taking this medication (dosage and times per day)? 1 tablets twice a day  3. Are you having a reaction (difficulty breathing--STAT)? no  4. What is your medication issue? Patient's wife states the patient's entresto dose was increased. She says he has enough medication for 90 days, but wants to know if the new prescription will need to be sent to the pharmacy for his next refill. She also would like to know if the novart[employer] form needs to be filled out again for the higher dose.

## 2020-11-01 NOTE — Progress Notes (Signed)
Remote ICD transmission.   

## 2020-11-02 ENCOUNTER — Other Ambulatory Visit (HOSPITAL_COMMUNITY): Payer: Self-pay

## 2020-11-06 ENCOUNTER — Encounter: Payer: Self-pay | Admitting: Internal Medicine

## 2020-11-06 ENCOUNTER — Other Ambulatory Visit: Payer: Self-pay

## 2020-11-06 ENCOUNTER — Ambulatory Visit (INDEPENDENT_AMBULATORY_CARE_PROVIDER_SITE_OTHER): Payer: Medicare (Managed Care)

## 2020-11-06 ENCOUNTER — Ambulatory Visit (INDEPENDENT_AMBULATORY_CARE_PROVIDER_SITE_OTHER): Payer: Medicare (Managed Care) | Admitting: Internal Medicine

## 2020-11-06 VITALS — BP 92/60 | HR 62 | Ht 70.0 in | Wt 133.0 lb

## 2020-11-06 DIAGNOSIS — F17200 Nicotine dependence, unspecified, uncomplicated: Secondary | ICD-10-CM

## 2020-11-06 DIAGNOSIS — Z95810 Presence of automatic (implantable) cardiac defibrillator: Secondary | ICD-10-CM

## 2020-11-06 DIAGNOSIS — I5022 Chronic systolic (congestive) heart failure: Secondary | ICD-10-CM | POA: Diagnosis not present

## 2020-11-06 DIAGNOSIS — I10 Essential (primary) hypertension: Secondary | ICD-10-CM

## 2020-11-06 DIAGNOSIS — I480 Paroxysmal atrial fibrillation: Secondary | ICD-10-CM

## 2020-11-06 DIAGNOSIS — I255 Ischemic cardiomyopathy: Secondary | ICD-10-CM

## 2020-11-06 LAB — CUP PACEART INCLINIC DEVICE CHECK
Battery Remaining Longevity: 25 mo
Brady Statistic RA Percent Paced: 40 %
Brady Statistic RV Percent Paced: 0.59 %
Date Time Interrogation Session: 20221003165926
HighPow Impedance: 72 Ohm
Implantable Lead Implant Date: 20120420
Implantable Lead Implant Date: 20180613
Implantable Lead Location: 753859
Implantable Lead Location: 753860
Implantable Pulse Generator Implant Date: 20160212
Lead Channel Impedance Value: 337.5 Ohm
Lead Channel Impedance Value: 637.5 Ohm
Lead Channel Sensing Intrinsic Amplitude: 1.8 mV
Lead Channel Sensing Intrinsic Amplitude: 11.6 mV
Lead Channel Setting Pacing Amplitude: 2 V
Lead Channel Setting Pacing Amplitude: 2.5 V
Lead Channel Setting Pacing Pulse Width: 0.5 ms
Lead Channel Setting Sensing Sensitivity: 0.5 mV
Pulse Gen Serial Number: 7225079

## 2020-11-06 MED ORDER — AMIODARONE HCL 200 MG PO TABS
200.0000 mg | ORAL_TABLET | Freq: Every day | ORAL | 3 refills | Status: DC
Start: 2020-11-06 — End: 2021-01-24

## 2020-11-06 NOTE — Patient Instructions (Addendum)
Medication Instructions:  Hold Jardiance Stop Spironolactone Stop Mexiletin   Reduce Amiodarone to 200 mg daily Your physician recommends that you continue on your current medications as directed. Please refer to the Current Medication list given to you today. *If you need a refill on your cardiac medications before your next appointment, please call your pharmacy*  Lab Work: None. If you have labs (blood work) drawn today and your tests are completely normal, you will receive your results only by: [place] (if you have MyChart) OR A paper copy in the mail If you have any lab test that is abnormal or we need to change your treatment, we will call you to review the results.  Testing/Procedures: Non-Cardiac CT Angiography (CTA), is a special type of CT scan that uses a computer to produce multi-dimensional views of major blood vessels throughout the body. In CT angiography, a contrast material is injected through an IV to help visualize the blood vessels   Follow-Up: At [facility], you and your health needs are our priority.  As part of our continuing mission to provide you with exceptional heart care, we have created designated Provider Care Teams.  These Care Teams include your primary Cardiologist (physician) and Advanced Practice Providers (APPs -  Physician Assistants and Nurse Practitioners) who all work together to provide you with the care you need, when you need it.  Your physician wants you to follow-up in: 11/24/20 at 1:45 pm with  Thompson Grayer, MD   Remote monitoring is used to monitor your ICD from home. This monitoring reduces the number of office visits required to check your device to one time per year. It allows [place] to keep an eye on the functioning of your device to ensure it is working properly. You are scheduled for a device check from home on 01/24/21. You may send your transmission at any time that day. If you have a wireless device, the transmission will be sent  automatically. After your physician reviews your transmission, you will receive a postcard with your next transmission date.  We recommend signing up for the patient portal called "MyChart".  Sign up information is provided on this After Visit Summary.  MyChart is used to connect with patients for Virtual Visits (Telemedicine).  Patients are able to view lab/test results, encounter notes, upcoming appointments, etc.  Non-urgent messages can be sent to your provider as well.   To learn more about what you can do with MyChart, go to [email or web address].    Any Other Special Instructions Will Be Listed Below (If Applicable).

## 2020-11-06 NOTE — Progress Notes (Signed)
PCP: Roselee Nova, MD Primary Cardiologist: Dr [name] Primary EP: Dr Jeralyn Ruths is a 75 y.o. male who presents today for routine electrophysiology followup.  Since his hospital discharge, the patient reports doing reasonably well.  No further VT!  He has had unsteadiness and profound gait instability.  He also feels week.  His wife is concerned that he cannot walk without "Staggering".  This has been present for over a week.  He has not had confusion, slurred speech, aphasia, numbness, motor deficit, visual change, or other sequelae of stroke.  Today, he denies symptoms of palpitations, chest pain, shortness of breath,  lower extremity edema, dizziness, presyncope, syncope, or ICD shocks.  The patient is otherwise without complaint today.   Past Medical History:  Diagnosis Date   Adrenal mass ([place])    per pt this is remote (10 years) and benign by biopsy   AICD (automatic cardioverter/defibrillator) present    Anxiety    Arthritis    CAD (coronary artery disease)    a. s/p PCI to LAD 2001 b. myoview 2014 high risk with scar LAD/RCA territory but no ischemia   Cardiomyopathy, ischemic    CHF (congestive heart failure) ([name])    class II/III   COPD (chronic obstructive pulmonary disease) ([place])    smokes cigars but has quit cigarettes   Defibrillator discharge 04/21/2019   Diverticulitis    Dyspnea    Flu 03/2015   HTN (hypertension)    Hyperlipidemia    NSVT (nonsustained ventricular tachycardia) 03/05/2017   Paroxysmal atrial fibrillation ([place]) 03/2015   chads2vasc score is 4   PSA (psoriatic arthritis) ([place])    increased   PVD (peripheral vascular disease) ([place])    Past Surgical History:  Procedure Laterality Date   ABDOMINAL AORTIC ENDOVASCULAR STENT GRAFT N/A 03/03/2017   Procedure: ABDOMINAL AORTIC ENDOVASCULAR STENT GRAFT;  Surgeon: Waynetta Sandy, MD;  Location: [place];  Service: Vascular;  Laterality: N/A;   cataract surgery     CORONARY  ANGIOGRAPHY N/A 10/12/2020   Procedure: CORONARY ANGIOGRAPHY;  Surgeon: Lorretta Harp, MD;  Location: [place] [facility];  Service: Cardiovascular;  Laterality: N/A;   CORONARY ANGIOPLASTY WITH STENT PLACEMENT  2001   a. PCI to LAD   IMPLANTABLE CARDIOVERTER DEFIBRILLATOR (ICD) GENERATOR CHANGE N/A 03/18/2014   a. SJM Fortify ST DR ICD implanted by [facility] for primary prevention b. gen change 03/2014    LEAD REVISION/REPAIR N/A 07/17/2016   Procedure: Lead Revision/Repair;  Surgeon: Evans Lance, MD;  Location: [place] [facility];  Service: Cardiovascular;  Laterality: N/A;   LEFT HEART CATHETERIZATION WITH CORONARY ANGIOGRAM N/A 05/27/2014   Procedure: LEFT HEART CATHETERIZATION WITH CORONARY ANGIOGRAM;  Surgeon: Sherren Mocha, MD;  Location: [facility] C[facility];  Service: Cardiovascular;  Laterality: N/A;   REPAIR KNEE LIGAMENT     V TACH ABLATION N/A 10/26/2020   Procedure: V TACH ABLATION;  Surgeon: Evans Lance, MD;  Location: [place] [facility];  Service: Cardiovascular;  Laterality: N/A;    ROS- all systems are reviewed and negative except as per HPI above  Current Outpatient Medications  Medication Sig Dispense Refill   acetaminophen (TYLENOL) 500 MG tablet Take 500-1,000 mg by mouth every 8 (eight) hours as needed (for pain).      amiodarone (PACERONE) 200 MG tablet Take 1 tablet (200 mg total) by mouth 2 (two) times daily. 120 tablet 2   atorvastatin (LIPITOR) 80 MG tablet TAKE 1 TABLET BY  MOUTH  DAILY AT 6 PM (Patient taking differently: TAKE 1 TABLET BY MOUTH  DAILY AT 6 PM) 90 tablet 3   carvedilol (COREG) 6.25 MG tablet Take 1 tablet (6.25 mg total) by mouth 2 (two) times daily with a meal. 60 tablet 3   empagliflozin (JARDIANCE) 10 MG TABS tablet Take 1 tablet (10 mg total) by mouth daily. 30 tablet 3   feeding supplement (ENSURE ENLIVE / ENSURE PLUS) LIQD Try to drink 1 can 1-2 times a day.     Fluticasone-Salmeterol (ADVAIR) 100-50 MCG/DOSE AEPB Inhale 1 puff into the lungs 2 (two)  times daily. 1 each 0   furosemide (LASIX) 40 MG tablet Take 0.5 tablets (20 mg total) by mouth daily. 45 tablet 3   isosorbide mononitrate (IMDUR) 30 MG 24 hr tablet Take 0.5 tablets (15 mg total) by mouth daily. 45 tablet 3   melatonin 5 MG TABS Take 1 tablet (5 mg total) by mouth at bedtime. 30 tablet 0   mexiletine (MEXITIL) 150 MG capsule Take 2 capsules (300 mg total) by mouth every 12 (twelve) hours. 120 capsule 2   mometasone-formoterol (DULERA) 100-5 MCG/ACT AERO Inhale 2 puffs into the lungs in the morning and at bedtime. 1 each 1   Multiple Vitamin (MULTIVITAMIN) capsule Take 1 capsule by mouth daily.     nitroGLYCERIN (NITROSTAT) 0.4 MG SL tablet Place 1 tablet (0.4 mg total) under the tongue every 5 (five) minutes x 3 doses as needed for chest pain. 25 tablet 12   pantoprazole (PROTONIX) 40 MG tablet Take 1 tablet (40 mg total) by mouth daily. 30 tablet 1   potassium chloride SA (KLOR-CON) 20 MEQ tablet TAKE 1 TABLET BY MOUTH  DAILY (Patient taking differently: Take 20 mEq by mouth daily.) 90 tablet 3   Pseudoeph-Doxylamine-DM-APAP (NYQUIL PO) Take 1 Dose by mouth at bedtime as needed (sleep). Heart healty nyquil     rivaroxaban (XARELTO) 20 MG TABS tablet Take 1 tablet (20 mg total) by mouth daily with supper. (Patient taking differently: Take 20 mg by mouth at bedtime.) 90 tablet 3   sacubitril-valsartan (ENTRESTO) 97-103 MG Take 1 tablet by mouth 2 (two) times daily. 60 tablet 0   spironolactone (ALDACTONE) 25 MG tablet Take 1 tablet (25 mg total) by mouth at bedtime. 90 tablet 3   No current facility-administered medications for this visit.    Physical Exam: Vitals:   11/06/20 1651  BP: 92/60  Pulse: 62  SpO2: 95%  Weight: 133 lb (60.3 kg)  Height: 5\' 10"  (1.778 m)    GEN- The patient is well appearing, alert and oriented x 3 today.   Head- normocephalic, atraumatic Eyes-  Sclera clear, conjunctiva pink Ears- hearing intact Oropharynx- clear Lungs- Clear to  ausculation bilaterally, normal work of breathing Chest- ICD pocket is well healed Heart- Regular rate and rhythm, no murmurs, rubs or gallops, PMI not laterally displaced GI- soft, NT, ND, + BS Extremities- no clubbing, cyanosis, or edema Neuro- CN II-XII intact, except for chronic eye muscle abnormality, strenght/ sensation intact.  + rhomberg, + dysdiodokenesis bilaterally with some flapping of hands after intentional movements,  very unsteady with heel to shin and requires assistance to prevent fall  ICD interrogation- reviewed in detail today,  See PACEART report    Wt Readings from Last 3 Encounters:  11/06/20 133 lb (60.3 kg)  10/28/20 130 lb 9.6 oz (59.2 kg)  10/13/20 137 lb 2 oz (62.2 kg)    Assessment and Plan:  1.  VT Well controlled post VT ablation by Dr Lovena Le No change required today Reduce amiodarone to 200mg  daily Stop mexiletine   2. Chronic systolic dysfunction euvolemic today Stable on an appropriate medical regimen Normal ICD function See Claudia Desanctis Art report he is not device dependant today followed in ICM device clinic See below medicine changes  3. HTN Stable No change required today  4. Afib Burden is <1% Continue xarelto for chads2vasc score of 5  5. Gait instability, unsteadiness Unclear etiology He has started several new medicines recently.  Could be due to low BP, metabolic cause, or stroke.  I will obtain bmet, cbc, tsh Head CT to evaluate urgently for stroke. Not able to get an MRI currently with ICD though we could consider if he does not improve clinically  Stop mexiletine, reduce amiodarone Stop spironolactone and jardiance as his BP is also low.  We can try restarting these later once he is improved.  Risks, benefits and potential toxicities for medications prescribed and/or refilled reviewed with patient today.   Return in 1-2 weeks to follow-up with me May need neurology referral pending clinical change and findings of urgent  head CT.  Very complicated patient.  A high level of decision making was required for the visit today.  Thompson Grayer MD, [facility] 11/06/2020 4:54 PM

## 2020-11-07 ENCOUNTER — Telehealth: Payer: Self-pay | Admitting: *Deleted

## 2020-11-07 ENCOUNTER — Other Ambulatory Visit: Payer: Medicare (Managed Care) | Admitting: *Deleted

## 2020-11-07 ENCOUNTER — Ambulatory Visit
Admission: RE | Admit: 2020-11-07 | Discharge: 2020-11-07 | Disposition: A | Discharge status: Home / Self Care | Payer: Medicare (Managed Care) | Source: Ambulatory Visit | Attending: Internal Medicine | Admitting: Internal Medicine

## 2020-11-07 DIAGNOSIS — I255 Ischemic cardiomyopathy: Secondary | ICD-10-CM

## 2020-11-07 DIAGNOSIS — I480 Paroxysmal atrial fibrillation: Secondary | ICD-10-CM

## 2020-11-07 DIAGNOSIS — F17200 Nicotine dependence, unspecified, uncomplicated: Secondary | ICD-10-CM

## 2020-11-07 DIAGNOSIS — I5022 Chronic systolic (congestive) heart failure: Secondary | ICD-10-CM

## 2020-11-07 DIAGNOSIS — I10 Essential (primary) hypertension: Secondary | ICD-10-CM

## 2020-11-07 LAB — CBC
Hematocrit: 34.3 % — ABNORMAL LOW (ref 37.5–51.0)
Hemoglobin: 11.5 g/dL — ABNORMAL LOW (ref 13.0–17.7)
MCH: 30.9 pg (ref 26.6–33.0)
MCHC: 33.5 g/dL (ref 31.5–35.7)
MCV: 92 fL (ref 79–97)
Platelets: 230 10*3/uL (ref 150–450)
RBC: 3.72 x10E6/uL — ABNORMAL LOW (ref 4.14–5.80)
RDW: 14.5 % (ref 11.6–15.4)
WBC: 8.5 10*3/uL (ref 3.4–10.8)

## 2020-11-07 MED ORDER — IOHEXOL 350 MG/ML SOLN: 100.0000 mL | Freq: Once | INTRAVENOUS | Status: DC | PRN

## 2020-11-07 NOTE — Telephone Encounter (Signed)
Advised to come by office today at 1:30 pm for labs and a CT scan.  Verbalized understanding

## 2020-11-08 LAB — COMPREHENSIVE METABOLIC PANEL
ALT: 47 IU/L — ABNORMAL HIGH (ref 0–44)
AST: 48 IU/L — ABNORMAL HIGH (ref 0–40)
Albumin/Globulin Ratio: 1.1 — ABNORMAL LOW (ref 1.2–2.2)
Albumin: 3.6 g/dL — ABNORMAL LOW (ref 3.7–4.7)
Alkaline Phosphatase: 92 IU/L (ref 44–121)
BUN/Creatinine Ratio: 9 — ABNORMAL LOW (ref 10–24)
BUN: 13 mg/dL (ref 8–27)
Bilirubin Total: 0.3 mg/dL (ref 0.0–1.2)
CO2: 17 mmol/L — ABNORMAL LOW (ref 20–29)
Calcium: 9.1 mg/dL (ref 8.6–10.2)
Chloride: 108 mmol/L — ABNORMAL HIGH (ref 96–106)
Creatinine, Ser: 1.45 mg/dL — ABNORMAL HIGH (ref 0.76–1.27)
Globulin, Total: 3.2 g/dL (ref 1.5–4.5)
Glucose: 104 mg/dL — ABNORMAL HIGH (ref 70–99)
Potassium: 4.4 mmol/L (ref 3.5–5.2)
Sodium: 145 mmol/L — ABNORMAL HIGH (ref 134–144)
Total Protein: 6.8 g/dL (ref 6.0–8.5)
eGFR: 50 mL/min/{1.73_m2} — ABNORMAL LOW (ref >59)

## 2020-11-08 LAB — TSH: TSH: 0.787 u[IU]/mL (ref 0.450–4.500)

## 2020-11-08 NOTE — Addendum Note (Signed)
Addended by: Darrell Jewel on: 11/08/2020 03:41 PM   Modules accepted: Orders

## 2020-11-08 NOTE — Progress Notes (Signed)
EPIC Encounter for ICM Monitoring  Patient Name: Benjamin Cook is a 75 y.o. male Date: 11/08/2020 Primary Care Physican: Roselee Nova, MD Primary Cardiologist: [name] Electrophysiologist: Allred 11/06/2020 Office Weight: 133 lbs     Transmission reviewed.  Pt seen in office 10/3.    CorVue thoracic impedance suggesting normal fluid levels.    Prescribed:  Furosemide 40 mg 0.5 tablet (20 mg total) daily.   Potassium 20 mEq take 1 tablet daily.   Labs: 11/07/2020 Creatinine 1.45, BUN 13, Potassium 4.4, Sodium 145 10/28/2020 Creatinine 1.06, BUN 10, Potassium 3.7, Sodium 134, GFR >60  10/27/2020 Creatinine 0.94, BUN 10, Potassium 4.5, Sodium 135, GFR >60  10/26/2020 Creatinine 1.06, BUN 10, Potassium 4.2, Sodium 137, GFR >60  10/25/2020 Creatinine 1.22, BUN 9,   Potassium 3.7, Sodium 137, GFR >60  10/14/2020 Creatinine 1.08, BUN 7,   Potassium 3.9, Sodium 136, GFR >60  A complete set of results can be found in Results Review.   Recommendations:  No changes.   Follow-up plan: ICM clinic phone appointment on 12/11/2020.   91 day device clinic remote transmission 01/24/2021.     EP/Cardiology Office Visits:  Recall 09/13/2020 with Dr. [name].  11/24/2020 with Dr Rayann Heman.   Copy of ICM check sent to Dr. Rayann Heman.      3 month ICM trend: 11/01/2020.    [name], RN 11/08/2020 3:31 PM

## 2020-11-09 ENCOUNTER — Telehealth: Payer: Self-pay | Admitting: Internal Medicine

## 2020-11-09 ENCOUNTER — Other Ambulatory Visit: Payer: Self-pay

## 2020-11-09 ENCOUNTER — Encounter: Payer: Self-pay | Admitting: Nurse Practitioner

## 2020-11-09 ENCOUNTER — Other Ambulatory Visit: Payer: Medicare (Managed Care) | Admitting: Nurse Practitioner

## 2020-11-09 DIAGNOSIS — R531 Weakness: Secondary | ICD-10-CM

## 2020-11-09 DIAGNOSIS — Z515 Encounter for palliative care: Secondary | ICD-10-CM

## 2020-11-09 NOTE — Progress Notes (Signed)
[facility] Palliative Care Consult Note Telephone: [phone]  Fax: [phone]   Date of encounter: 11/09/20 10:25 PM PATIENT NAME: Benjamin Cook [address] [place] [address]   [phone] (home)  DOB: 04/04/1945 MRN: [id number] PRIMARY CARE PROVIDER:    Roselee Nova, MD,  [address] [place] [address] [phone]  RESPONSIBLE PARTY:    Contact Information     Name Relation Home Work Benjamin Cook Spouse [phone]  [phone]      I met face to face with patient and family in home. Palliative Care was asked to follow this patient by consultation request of  Benjamin Cook, Benjamin Bors, MD to address advance care planning and complex medical decision making. This is the initial visit.                          ASSESSMENT AND PLAN / RECOMMENDATIONS:   Advance Care Planning/Goals of Care: Goals include to maximize quality of life and symptom management. Patient/health care surrogate gave his/her permission to discuss.Our advance care planning conversation included a discussion about:    The value and importance of advance care planning  Experiences with loved ones who have been seriously ill or have died  Exploration of personal, cultural or spiritual beliefs that might influence medical decisions  Exploration of goals of care in the event of a sudden injury or illness  Identification and preparation of a healthcare agent  Review and updating or creation of an  advance directive document . Decision not to resuscitate or to de-escalate disease focused treatments due to poor prognosis. CODE STATUS: DNR  Symptom Management/Plan: 1. Advance Care Planning; Discussed and wishes are to be DNR, golden rod form completed, placed in vynca; Reviewed MOST form, blank copy left for further family discussion, will revisit at next [facility] visit  2. Goals of Care: Goals include to maximize quality of life and symptom  management. Our advance care planning conversation included a discussion about:    The value and importance of advance care planning  Exploration of personal, cultural or spiritual beliefs that might influence medical decisions  Exploration of goals of care in the event of a sudden injury or illness  Identification and preparation of a healthcare agent  Review and updating or creation of an advance directive document.  3. Generalized weakness secondary to decompensations due to CHF recent hospitalizations with VT; discussed option of PT but Mr. Benjamin Cook wanted to wait at this time. Continue to encourage mobility, fall risk.   4. Palliative care encounter; Palliative care encounter; Palliative medicine team will continue to support patient, patient's family, and medical team. Visit consisted of counseling and education dealing with the complex and emotionally intense issues of symptom management and palliative care in the setting of serious and potentially life-threatening illness  5. f/u 6 weeks for ongoing monitoring chronic disease progression, ongoing discussions complex medical decision making   Follow up Palliative Care Visit: Palliative care will continue to follow for complex medical decision making, advance care planning, and clarification of goals. Return 6 weeks or prn.  I spent 78 minutes providing this consultation. More than 50% of the time in this consultation was spent in counseling and care coordination. PPS: 40%  Chief Complaint: Initial palliative consult for complex medical decision making  HISTORY OF PRESENT ILLNESS:  Benjamin Cook is a 75 y.o. year old male  with multiple medical problems including CHF,  CAD s/p PCI, COPD, NSVT s/p AICD, afib, adrenal mass (>33yr benign per [name], HTN, HLD, PVD, tobacco abuse. I called [name] confirm PC initial visit and covid screening which was negative. I visited Mr and Mrs MGoostreein their home. [name]  sitting in the dining room chair. We talked about purpose of PC visit. We talked about how [name] been feeling. We talked about life review. Mr. Benjamin Cook at home with his wife of 573years. We talked about family dynamics. We talked about pmh, chronic disease with recent hospitalization. We talked about symptoms which have improved since last procedure. [name] he has not had a syncope episode since returning home. We talked about his ability to perform ADL's, functioning level. [name] feed himself with declined appetite and reported weight loss. We talked about pending CT head, [name] insurance required them to go to a different location. [name] have CT this afternoon. We talked about option of neurology consult for unsteady gait with episodes of syncope (though syncope likely cardiac related). We talked about option of CSara Lee what is provided. Will contact Cardiology to see if that would be an option. Mrs. MPrittrequested PC SW to contact to look at insurance with her to see what plan would be best rather than what they have currently. [name] he saw the ad on TV that it appeared it would have covered more healthcare costs, convinces, resources but they are finding it is not the case. Will do PC SW consult. We talked about chronic disease progression. We talked about medical goals of care. We talked about code status as currently a full code. Reviewed scenarios. [name] he wishes to be a DNR., goldenrod form completed and placed in vynca. We talked about MOST form, blank copy left for Mrs and Mr MCarllto further discuss and review with adult children. We talked about role pc in plan of care with f/u visits, scheduled. Therapeutic listening, emotional support provided. Questions answered.   History obtained from review of EMR, discussion with Mrs and Mr. MBarge  I reviewed available labs,  medications, imaging, studies and related documents from the EMR.  Records reviewed and summarized above.   ROS Full 10 system review of systems performed and negative with exception of: as per HPI.   Physical Exam: Constitutional: NAD General: frail appearing, thin chronically ill, pleasant male EYES: lids intact ENMT: oral mucous membranes moist CV: S1S2, RRR Pulmonary: LCTA, no increased work of breathing, no cough, room air Abdomen: normo-active BS + 4 quadrants, soft and non tender, MSK: ambulatory Skin: warm and dry= Neuro:  + generalized weakness,  no cognitive impairment Psych: non-anxious affect, A and O x 3  CURRENT PROBLEM LIST:  Patient Active Problem List   Diagnosis Date Noted   V tach 10/11/2020   Abdominal pain 10/11/2020   Ventricular tachycardia 10/11/2020   Hypokalemia 06/04/2020   Elevated troponin 06/04/2020   Malnutrition of moderate degree 06/04/2020   Defibrillator discharge 04/22/2019   Hypocalcemia 08/08/2017   Goals of care, counseling/discussion 08/08/2017   Anemia, iron deficiency 08/08/2017   NSVT (nonsustained ventricular tachycardia) 03/05/2017   AAA (abdominal aortic aneurysm) 03/03/2017   Low back pain 01/01/2017   Adrenal mass ([place] 11/24/2016   Acute Sigmoid diverticulitis 11/15/2016   Diverticulitis 11/15/2016   ICD (implantable cardioverter-defibrillator) lead failure 07/16/2016   Paroxysmal atrial fibrillation ([place] 06/26/2015   Dyspnea 04/10/2015  Acute respiratory failure ([place]) 03/28/2015   Essential hypertension 01/74/9449   Systolic CHF ([place]) 67/59/1638   Pyrexia    Shortness of breath    Influenza A    VT (ventricular tachycardia) ([place])    ICD (implantable cardioverter-defibrillator) in place 06/08/2010   HFrEF (heart failure with reduced ejection fraction) ([place]) 04/18/2010   WEIGHT LOSS 11/01/2008   TOBACCO ABUSE 09/06/2008   Aneurysm of abdominal vessel (5.6 CM) 09/06/2008   HLD (hyperlipidemia) 09/05/2008   COUGH  04/08/2007   PSA, INCREASED 04/08/2007   OTHER ABNORMAL BLOOD CHEMISTRY 03/24/2007   Adrenal nodule ([place]) 10/01/2006   Anxiety state 10/01/2006   HYPERTENSION 10/01/2006   CAD (coronary artery disease) 10/01/2006   Cardiomyopathy, ischemic 10/01/2006   COPD, severe ([place]) 10/01/2006   HYPERGLYCEMIA 10/01/2006   HEMATURIA, HX OF 10/01/2006   PAST MEDICAL HISTORY:  Active Ambulatory Problems    Diagnosis Date Noted   Adrenal nodule ([place]) 10/01/2006   HLD (hyperlipidemia) 09/05/2008   Anxiety state 10/01/2006   TOBACCO ABUSE 09/06/2008   HYPERTENSION 10/01/2006   CAD (coronary artery disease) 10/01/2006   Cardiomyopathy, ischemic 10/01/2006   Aneurysm of abdominal vessel (5.6 CM) 09/06/2008   COPD, severe ([place]) 10/01/2006   WEIGHT LOSS 11/01/2008   COUGH 04/08/2007   HYPERGLYCEMIA 10/01/2006   OTHER ABNORMAL BLOOD CHEMISTRY 03/24/2007   PSA, INCREASED 04/08/2007   HEMATURIA, HX OF 10/01/2006   HFrEF (heart failure with reduced ejection fraction) ([place]) 04/18/2010   ICD (implantable cardioverter-defibrillator) in place 06/08/2010   VT (ventricular tachycardia) ([name])    Acute respiratory failure ([place]) 03/28/2015   Essential hypertension 46/65/9935   Systolic CHF ([place]) 70/17/7939   Pyrexia    Shortness of breath    Influenza A    Dyspnea 04/10/2015   Paroxysmal atrial fibrillation ([place]) 06/26/2015   ICD (implantable cardioverter-defibrillator) lead failure 07/16/2016   Acute Sigmoid diverticulitis 11/15/2016   Diverticulitis 11/15/2016   Adrenal mass ([place]) 11/24/2016   Low back pain 01/01/2017   AAA (abdominal aortic aneurysm) 03/03/2017   NSVT (nonsustained ventricular tachycardia) 03/05/2017   Hypocalcemia 08/08/2017   Goals of care, counseling/discussion 08/08/2017   Anemia, iron deficiency 08/08/2017   Defibrillator discharge 04/22/2019   Hypokalemia 06/04/2020   Elevated troponin 06/04/2020   Malnutrition of moderate degree 06/04/2020   V tach 10/11/2020    Abdominal pain 10/11/2020   Ventricular tachycardia 10/11/2020   Resolved Ambulatory Problems    Diagnosis Date Noted   Preoperative clearance 04/08/2012   ICD (implantable cardioverter-defibrillator) malfunction 03/18/2014   Past Medical History:  Diagnosis Date   AICD (automatic cardioverter/defibrillator) present    Anxiety    Arthritis    CHF (congestive heart failure) ([name])    COPD (chronic obstructive pulmonary disease) ([place])    Flu 03/2015   HTN (hypertension)    Hyperlipidemia    PSA (psoriatic arthritis) ([name])    PVD (peripheral vascular disease) ([place])    SOCIAL HX:  Social History   Tobacco Use   Smoking status: Every Day    Types: Cigars   Smokeless tobacco: Never   Tobacco comments:    smokes cigars now, no longer smokes cigarettes but previously smoked 2ppd  Substance Use Topics   Alcohol use: No   FAMILY HX:  Family History  Problem Relation Age of Onset   Cirrhosis Mother        due to ETOH   Cancer Neg Hx     reviewed  ALLERGIES: No Known Allergies   PERTINENT MEDICATIONS:  Outpatient  Encounter Medications as of 11/09/2020  Medication Sig   acetaminophen (TYLENOL) 500 MG tablet Take 500-1,000 mg by mouth every 8 (eight) hours as needed (for pain).    amiodarone (PACERONE) 200 MG tablet Take 1 tablet (200 mg total) by mouth daily.   atorvastatin (LIPITOR) 80 MG tablet TAKE 1 TABLET BY MOUTH  DAILY AT 6 PM (Patient taking differently: TAKE 1 TABLET BY MOUTH  DAILY AT 6 PM)   carvedilol (COREG) 6.25 MG tablet Take 1 tablet (6.25 mg total) by mouth 2 (two) times daily with a meal.   empagliflozin (JARDIANCE) 10 MG TABS tablet Take 1 tablet (10 mg total) by mouth daily.   feeding supplement (ENSURE ENLIVE / ENSURE PLUS) LIQD Try to drink 1 can 1-2 times a day.   Fluticasone-Salmeterol (ADVAIR) 100-50 MCG/DOSE AEPB Inhale 1 puff into the lungs 2 (two) times daily.   furosemide (LASIX) 40 MG tablet Take 0.5 tablets (20 mg total) by mouth daily.   isosorbide  mononitrate (IMDUR) 30 MG 24 hr tablet Take 0.5 tablets (15 mg total) by mouth daily.   melatonin 5 MG TABS Take 1 tablet (5 mg total) by mouth at bedtime.   mometasone-formoterol (DULERA) 100-5 MCG/ACT AERO Inhale 2 puffs into the lungs in the morning and at bedtime.   Multiple Vitamin (MULTIVITAMIN) capsule Take 1 capsule by mouth daily.   nitroGLYCERIN (NITROSTAT) 0.4 MG SL tablet Place 1 tablet (0.4 mg total) under the tongue every 5 (five) minutes x 3 doses as needed for chest pain.   pantoprazole (PROTONIX) 40 MG tablet Take 1 tablet (40 mg total) by mouth daily.   potassium chloride SA (KLOR-CON) 20 MEQ tablet TAKE 1 TABLET BY MOUTH  DAILY (Patient taking differently: Take 20 mEq by mouth daily.)   Pseudoeph-Doxylamine-DM-APAP (NYQUIL PO) Take 1 Dose by mouth at bedtime as needed (sleep). Heart healty nyquil   rivaroxaban (XARELTO) 20 MG TABS tablet Take 1 tablet (20 mg total) by mouth daily with supper. (Patient taking differently: Take 20 mg by mouth at bedtime.)   sacubitril-valsartan (ENTRESTO) 97-103 MG Take 1 tablet by mouth 2 (two) times daily.   No facility-administered encounter medications on file as of 11/09/2020.  Questions and concerns were addressed. The patient/family was encouraged to call with questions and/or concerns. My business card was provided. Provided general support and encouragement, no other unmet needs identified   Thank you for the opportunity to participate in the care of Mr. Lonzo.  The palliative care team will continue to follow. Please call our office at [phone] if we can be of additional assistance.   This chart was dictated using voice recognition software.  Despite best efforts to proofread,  errors can occur which can change the documentation meaning.   Maci Eickholt Z Jahmiya Guidotti, NP ,   COVID-19 PATIENT SCREENING TOOL Asked and negative response unless otherwise noted:  Have you had symptoms of covid, tested positive or been in contact with someone  with symptoms/positive test in the past 5-10 days? NO

## 2020-11-09 NOTE — Addendum Note (Signed)
Addended by: Darrell Jewel on: 11/09/2020 10:28 AM   Modules accepted: Orders

## 2020-11-09 NOTE — Telephone Encounter (Signed)
Received a call from [facility] with [facility] calling to have a order for head ct faxed.Dr.Allred's 10/3 office note with head ct order electronically signed faxed to [facility] at fax # [phone].

## 2020-11-15 ENCOUNTER — Other Ambulatory Visit: Payer: Medicare (Managed Care)

## 2020-11-15 ENCOUNTER — Telehealth: Payer: Self-pay

## 2020-11-15 NOTE — Telephone Encounter (Signed)
145 pm.  Request received from Hollis, NP to schedule a follow up visit in 3 weeks to assess syncopal episodes, CT scan and overall condition.  Phone call made to wife to schedule a visit.  She reports patient is doing really well right now and has requested a visit for November.  Visit scheduled for 11/7 @ 11 am.

## 2020-11-21 ENCOUNTER — Other Ambulatory Visit: Payer: Self-pay | Admitting: Physician Assistant

## 2020-11-21 NOTE — Telephone Encounter (Signed)
Pt last saw Dr Rayann Heman 11/06/20, last labs 11/07/20 Creat 1.45, age 75, weight 60.3kg, CrCl 36.39, based on CrCl pt is not on appropriate dosage of Xarelto.  Most recent Creat elevated, but historically has not been the case, steady at  1.06 on 10/28/20, 0.95 on 10/13/20.  Pt has an upcoming appt on 11/24/20 to see Dr Rayann Heman, placed note on appt to repeat BMP, if serum Creat remains elevated may need to reduce Xarelto dosage to 15mg  QD given most recent CrCl 36.39.  Will await lab results to address refill.

## 2020-11-22 NOTE — Telephone Encounter (Signed)
Called patient to verify pharmacy.

## 2020-11-23 ENCOUNTER — Other Ambulatory Visit: Payer: Self-pay | Admitting: Student

## 2020-11-24 ENCOUNTER — Ambulatory Visit: Payer: Medicare (Managed Care) | Admitting: Internal Medicine

## 2020-11-24 ENCOUNTER — Other Ambulatory Visit: Payer: Self-pay

## 2020-11-24 VITALS — BP 160/92 | HR 60 | Ht 70.0 in | Wt 137.8 lb

## 2020-11-24 DIAGNOSIS — I480 Paroxysmal atrial fibrillation: Secondary | ICD-10-CM | POA: Diagnosis not present

## 2020-11-24 DIAGNOSIS — I10 Essential (primary) hypertension: Secondary | ICD-10-CM | POA: Diagnosis not present

## 2020-11-24 DIAGNOSIS — I472 Ventricular tachycardia, unspecified: Secondary | ICD-10-CM | POA: Diagnosis not present

## 2020-11-24 DIAGNOSIS — I5022 Chronic systolic (congestive) heart failure: Secondary | ICD-10-CM | POA: Diagnosis not present

## 2020-11-24 DIAGNOSIS — R2681 Unsteadiness on feet: Secondary | ICD-10-CM

## 2020-11-24 NOTE — Patient Instructions (Addendum)
Medication Instructions:  Your physician recommends that you continue on your current medications as directed. Please refer to the Current Medication list given to you today. *If you need a refill on your cardiac medications before your next appointment, please call your pharmacy*  Lab Work: BMP, CBC If you have labs (blood work) drawn today and your tests are completely normal, you will receive your results only by: [place] (if you have MyChart) OR A paper copy in the mail If you have any lab test that is abnormal or we need to change your treatment, we will call you to review the results.  Testing/Procedures: None.  Follow-Up: At [facility], you and your health needs are our priority.  As part of our continuing mission to provide you with exceptional heart care, we have created designated Provider Care Teams.  These Care Teams include your primary Cardiologist (physician) and Advanced Practice Providers (APPs -  Physician Assistants and Nurse Practitioners) who all work together to provide you with the care you need, when you need it.  Your physician wants you to follow-up in: 01/24/21 at 11am with   one of the following Advanced Practice Providers on your designated Care Team:     Legrand Como "Jonni Sanger" [place], [place]    Remote monitoring is used to monitor your ICD from home. This monitoring reduces the number of office visits required to check your device to one time per year. It allows [place] to keep an eye on the functioning of your device to ensure it is working properly. You are scheduled for a device check from home on 12/11/20. You may send your transmission at any time that day. If you have a wireless device, the transmission will be sent automatically. After your physician reviews your transmission, you will receive a postcard with your next transmission date.  We recommend signing up for the patient portal called "MyChart".  Sign up information is provided on this After Visit  Summary.  MyChart is used to connect with patients for Virtual Visits (Telemedicine).  Patients are able to view lab/test results, encounter notes, upcoming appointments, etc.  Non-urgent messages can be sent to your provider as well.   To learn more about what you can do with MyChart, go to [email or web address].    Any Other Special Instructions Will Be Listed Below (If Applicable).

## 2020-11-24 NOTE — Progress Notes (Signed)
PCP: Roselee Nova, MD Primary Cardiologist: Dr [name] Primary EP: Dr Jeralyn Ruths is a 75 y.o. male who presents today for routine electrophysiology followup.  Since last being seen in our clinic, the patient reports doing very well.  Dizziness is improved.  VT has resolved post ablation.  Today, he denies symptoms of palpitations, chest pain, shortness of breath,  lower extremity edema, presyncope, syncope, or ICD shocks.  The patient is otherwise without complaint today.   Past Medical History:  Diagnosis Date   Adrenal mass ([place])    per pt this is remote (10 years) and benign by biopsy   AICD (automatic cardioverter/defibrillator) present    Anxiety    Arthritis    CAD (coronary artery disease)    a. s/p PCI to LAD 2001 b. myoview 2014 high risk with scar LAD/RCA territory but no ischemia   Cardiomyopathy, ischemic    CHF (congestive heart failure) ([name])    class II/III   COPD (chronic obstructive pulmonary disease) ([place])    smokes cigars but has quit cigarettes   Defibrillator discharge 04/21/2019   Diverticulitis    Dyspnea    Flu 03/2015   HTN (hypertension)    Hyperlipidemia    NSVT (nonsustained ventricular tachycardia) 03/05/2017   Paroxysmal atrial fibrillation ([place]) 03/2015   chads2vasc score is 4   PSA (psoriatic arthritis) ([place])    increased   PVD (peripheral vascular disease) ([place])    Past Surgical History:  Procedure Laterality Date   ABDOMINAL AORTIC ENDOVASCULAR STENT GRAFT N/A 03/03/2017   Procedure: ABDOMINAL AORTIC ENDOVASCULAR STENT GRAFT;  Surgeon: Waynetta Sandy, MD;  Location: [facility];  Service: Vascular;  Laterality: N/A;   cataract surgery     CORONARY ANGIOGRAPHY N/A 10/12/2020   Procedure: CORONARY ANGIOGRAPHY;  Surgeon: Lorretta Harp, MD;  Location: [facility];  Service: Cardiovascular;  Laterality: N/A;   CORONARY ANGIOPLASTY WITH STENT PLACEMENT  2001   a. PCI to LAD   IMPLANTABLE CARDIOVERTER  DEFIBRILLATOR (ICD) GENERATOR CHANGE N/A 03/18/2014   a. SJM Fortify ST DR ICD implanted by [facility] for primary prevention b. gen change 03/2014    LEAD REVISION/REPAIR N/A 07/17/2016   Procedure: Lead Revision/Repair;  Surgeon: Evans Lance, MD;  Location: [facility];  Service: Cardiovascular;  Laterality: N/A;   LEFT HEART CATHETERIZATION WITH CORONARY ANGIOGRAM N/A 05/27/2014   Procedure: LEFT HEART CATHETERIZATION WITH CORONARY ANGIOGRAM;  Surgeon: Sherren Mocha, MD;  Location: [facility];  Service: Cardiovascular;  Laterality: N/A;   REPAIR KNEE LIGAMENT     V TACH ABLATION N/A 10/26/2020   Procedure: V TACH ABLATION;  Surgeon: Evans Lance, MD;  Location: [facility];  Service: Cardiovascular;  Laterality: N/A;    ROS- all systems are reviewed and negative except as per HPI above  Current Outpatient Medications  Medication Sig Dispense Refill   acetaminophen (TYLENOL) 500 MG tablet Take 500-1,000 mg by mouth every 8 (eight) hours as needed (for pain).      amiodarone (PACERONE) 200 MG tablet Take 1 tablet (200 mg total) by mouth daily. 90 tablet 3   atorvastatin (LIPITOR) 80 MG tablet TAKE 1 TABLET BY MOUTH  DAILY AT 6 PM (Patient taking differently: TAKE 1 TABLET BY MOUTH  DAILY AT 6 PM) 90 tablet 3   carvedilol (COREG) 6.25 MG tablet Take 1 tablet (6.25 mg total) by mouth 2 (two) times daily with a meal. 60 tablet 3  feeding supplement (ENSURE ENLIVE / ENSURE PLUS) LIQD Try to drink 1 can 1-2 times a day.     Fluticasone-Salmeterol (ADVAIR) 100-50 MCG/DOSE AEPB Inhale 1 puff into the lungs 2 (two) times daily. 1 each 0   furosemide (LASIX) 40 MG tablet Take 0.5 tablets (20 mg total) by mouth daily. 45 tablet 3   isosorbide mononitrate (IMDUR) 30 MG 24 hr tablet TAKE 1/2 OF A TABLET (15 MG TOTAL) BY MOUTH DAILY 45 tablet 3   melatonin 5 MG TABS Take 1 tablet (5 mg total) by mouth at bedtime. 30 tablet 0   mometasone-formoterol (DULERA) 100-5 MCG/ACT AERO Inhale 2 puffs  into the lungs in the morning and at bedtime. 1 each 1   Multiple Vitamin (MULTIVITAMIN) capsule Take 1 capsule by mouth daily.     nitroGLYCERIN (NITROSTAT) 0.4 MG SL tablet Place 1 tablet (0.4 mg total) under the tongue every 5 (five) minutes x 3 doses as needed for chest pain. 25 tablet 12   pantoprazole (PROTONIX) 40 MG tablet Take 1 tablet (40 mg total) by mouth daily. 30 tablet 1   potassium chloride SA (KLOR-CON) 20 MEQ tablet TAKE 1 TABLET BY MOUTH  DAILY (Patient taking differently: Take 20 mEq by mouth daily.) 90 tablet 3   Pseudoeph-Doxylamine-DM-APAP (NYQUIL PO) Take 1 Dose by mouth at bedtime as needed (sleep). Heart healty nyquil     rivaroxaban (XARELTO) 20 MG TABS tablet Take 1 tablet (20 mg total) by mouth daily with supper. (Patient taking differently: Take 20 mg by mouth at bedtime.) 90 tablet 3   sacubitril-valsartan (ENTRESTO) 97-103 MG Take 1 tablet by mouth 2 (two) times daily. 60 tablet 0   empagliflozin (JARDIANCE) 10 MG TABS tablet Take 1 tablet (10 mg total) by mouth daily. (Patient not taking: Reported on 11/24/2020) 30 tablet 3   No current facility-administered medications for this visit.    Physical Exam: Vitals:   11/24/20 1122  BP: (!) 160/92  Pulse: 60  SpO2: 99%  Weight: 137 lb 12.8 oz (62.5 kg)  Height: 5\' 10"  (1.778 m)    GEN- The patient is well appearing, alert and oriented x 3 today.   Head- normocephalic, atraumatic Eyes-  Sclera clear, conjunctiva pink Ears- hearing intact Oropharynx- clear Lungs- Clear to ausculation bilaterally, normal work of breathing Chest- ICD pocket is well healed Heart- Regular rate and rhythm, no murmurs, rubs or gallops, PMI not laterally displaced GI- soft, NT, ND, + BS Extremities- no clubbing, cyanosis, or edema  ICD interrogation- reviewed in detail today,  See PACEART report  ekg tracing ordered today is personally reviewed and shows atrial paced, RBBB  Wt Readings from Last 3 Encounters:  11/24/20 137 lb  12.8 oz (62.5 kg)  11/06/20 133 lb (60.3 kg)  10/28/20 130 lb 9.6 oz (59.2 kg)    Assessment and Plan:  1.  Chronic systolic dysfunction euvolemic today Stable on an appropriate medical regimen Normal ICD function See [facility] report No changes today he is not device dependant today followed in ICM device clinic He had dehydration, unsteadiness, ARF with jardiance previously.  I have stopped this medicine.  Cardiology team can rechallenge in the future.  2. VT Resolved post ablation Continue amiodarone 200mg  daily Hopefully eventually we can wean this to 100mg  daily and subsequently to off  3. HTN Stable No change required today  4. Afib Burden <1% Doing well On xarelto for chads2vasc score of 5  5. Gait instability/ dizziness Resolved Head CT reviewed with  him today Likely due to dehydration initially  6. Acute renal failure Noted by labs 2 weeks ago Medicines adjusted Repeat bmet today  Could consider rechallenging with jardiance in the future.  Temporally seems have occurred with addition of this medicine   Risks, benefits and potential toxicities for medications prescribed and/or refilled reviewed with patient today.   Return in 20months for EP follow-up and then with Dr Lovena Le thereafter.  Thompson Grayer MD, [facility] 11/24/2020 11:26 AM

## 2020-11-25 LAB — CBC
Hematocrit: 35.7 % — ABNORMAL LOW (ref 37.5–51.0)
Hemoglobin: 11.9 g/dL — ABNORMAL LOW (ref 13.0–17.7)
MCH: 30.5 pg (ref 26.6–33.0)
MCHC: 33.3 g/dL (ref 31.5–35.7)
MCV: 92 fL (ref 79–97)
Platelets: 253 10*3/uL (ref 150–450)
RBC: 3.9 x10E6/uL — ABNORMAL LOW (ref 4.14–5.80)
RDW: 13.4 % (ref 11.6–15.4)
WBC: 7.2 10*3/uL (ref 3.4–10.8)

## 2020-11-25 LAB — BASIC METABOLIC PANEL
BUN/Creatinine Ratio: 10 (ref 10–24)
BUN: 11 mg/dL (ref 8–27)
CO2: 24 mmol/L (ref 20–29)
Calcium: 9.1 mg/dL (ref 8.6–10.2)
Chloride: 103 mmol/L (ref 96–106)
Creatinine, Ser: 1.06 mg/dL (ref 0.76–1.27)
Glucose: 91 mg/dL (ref 70–99)
Potassium: 4.3 mmol/L (ref 3.5–5.2)
Sodium: 141 mmol/L (ref 134–144)
eGFR: 72 mL/min/{1.73_m2} (ref >59)

## 2020-11-27 NOTE — Telephone Encounter (Signed)
Prescription refill request for Xarelto received.  Indication: Afib  Last office visit: 11/24/20 (Allred)  Weight: 62.5kg Age: 75 Scr: 1.06 (11/24/20) CrCl:51.67ml/min  Appropriate dose and refill sent to requested pharmacy.

## 2020-12-01 ENCOUNTER — Telehealth: Payer: Self-pay | Admitting: Nurse Practitioner

## 2020-12-01 NOTE — Telephone Encounter (Signed)
Spoke with patient's wife to see if we could reschedule the Palliative f/u visit on 12/21/20 to 01/17/21 @ 2 PM and she was in agreement with this.  Wife stated that patient was doing really well.

## 2020-12-06 ENCOUNTER — Telehealth: Payer: Self-pay | Admitting: Cardiology

## 2020-12-06 MED ORDER — POTASSIUM CHLORIDE CRYS ER 20 MEQ PO TBCR: 20.0000 meq | EXTENDED_RELEASE_TABLET | Freq: Every day | ORAL | 3 refills | Status: DC

## 2020-12-06 NOTE — Telephone Encounter (Signed)
Spoke with pt and spouse. They are aware that Potassium Chloride was sent into pts pharmacy, per pts request.

## 2020-12-06 NOTE — Telephone Encounter (Signed)
*  STAT* If patient is at the pharmacy, call can be transferred to refill team.   1. Which medications need to be refilled? (please list name of each medication and dose if known)  potassium chloride SA (KLOR-CON) 20 MEQ tablet  2. Which pharmacy/location (including street and city if local pharmacy) is medication to be sent to? [id number] - [place], [place] - [address]  3. Do they need a 30 day or 90 day supply? 90 with refills

## 2020-12-11 ENCOUNTER — Other Ambulatory Visit: Payer: Medicare (Managed Care)

## 2020-12-11 ENCOUNTER — Ambulatory Visit (INDEPENDENT_AMBULATORY_CARE_PROVIDER_SITE_OTHER): Payer: Medicare (Managed Care)

## 2020-12-11 ENCOUNTER — Other Ambulatory Visit: Payer: Self-pay

## 2020-12-11 VITALS — BP 128/60 | HR 67 | Temp 97.6°F | Resp 20 | Wt 134.6 lb

## 2020-12-11 DIAGNOSIS — Z515 Encounter for palliative care: Secondary | ICD-10-CM

## 2020-12-11 DIAGNOSIS — Z95810 Presence of automatic (implantable) cardiac defibrillator: Secondary | ICD-10-CM | POA: Diagnosis not present

## 2020-12-11 DIAGNOSIS — I5022 Chronic systolic (congestive) heart failure: Secondary | ICD-10-CM | POA: Diagnosis not present

## 2020-12-11 NOTE — Progress Notes (Signed)
PATIENT NAME: Benjamin Cook DOB: 11-18-1945 MRN: [id number]  PRIMARY CARE PROVIDER: Roselee Nova, MD  RESPONSIBLE PARTY:  Acct ID - Guarantor Home Phone Work Phone Relationship Acct Type  [id number] Benjamin Cook, [phone]  Self P/F     [address], [place], [place] [address]    PLAN OF CARE and INTERVENTIONS:               1.  GOALS OF CARE/ ADVANCE CARE PLANNING:  Remain home under the care of his wife.                2.  PATIENT/CAREGIVER EDUCATION:  Remeron               4. PERSONAL EMERGENCY PLAN:  Activate 911 for emergencies.               5.  DISEASE STATUS:  Joint visit completed with patient, wife and [place], [place].   Anorexia:  Patient endorses declining appetite over the last 2 weeks.  Yesterday he consumed a sausage, egg biscuit with a cup of fruit and then another cup of fruit for dinner.  Weight obtained on today's visit and there has been a 2.4 lb weight loss since his last MD visit on 11/24/20. Patient endorses drinking boost 2 x daily with his medications  We discussed the use of Remeron as an appetite stimulant.  Patient will be meeting with his new PCP tomorrow and will further discuss weight loss.    CHF:  Patient does not regularly check his weight.  He does have a new scale in the home and is encouraged to utilize this routinely.  Patient denies shortness of breath at rest.  Mild shortness of breath with exertion.   No issues with chest pain, dizziness, or syncopal episodes.   HISTORY OF PRESENT ILLNESS:  Benjamin Cook is a 75 y.o. year old male  with multiple medical problems including CHF, CAD s/p PCI, COPD, NSVT s/p AICD, afib, adrenal mass (>64yrs benign per Mr. Divis), HTN, HLD, PVD, tobacco abuse  CODE STATUS: Unable to access Vynca ADVANCED DIRECTIVES: Unable to access Vynca MOST FORM: Unable to access Vynca PPS: 50%   PHYSICAL EXAM:   VITALS: Today's Vitals   12/11/20 1320  BP: 128/60  Pulse: 67  Resp: 20  Temp: 97.6 F  (36.4 C)  SpO2: 95%  Weight: 134 lb 9.6 oz (61.1 kg)  PainSc: 0-No pain    LUNGS: clear to auscultation  CARDIAC: Cor RRR EXTREMITIES: - for edema. SKIN: Skin color, texture, turgor normal. No rashes or lesions or normal  NEURO: A & O x 3       Lorenza Burton, RN

## 2020-12-11 NOTE — Progress Notes (Signed)
COMMUNITY PALLIATIVE CARE SW NOTE  PATIENT NAME: Benjamin Cook DOB: 03-05-1945 MRN: [id number]  PRIMARY CARE PROVIDER: Roselee Nova, MD  RESPONSIBLE PARTY:  Acct ID - Guarantor Home Phone Work Phone Relationship Acct Type  [id number] AMOS, [phone]  Self P/F     [address], [place], [place] [address]     PLAN OF CARE and INTERVENTIONS:             GOALS OF CARE/ ADVANCE CARE PLANNING:  Patient is a DNR. Patient's goals include to maximize quality of life and symptom management and avoid hospitalizations.  2.         SOCIAL/EMOTIONAL/SPIRITUAL ASSESSMENT/ INTERVENTIONS:  SW and RN met with patient in patients home for monthly follow up visit for [facility] NP. Patient lives in a story home with spouse.   Patient is 75 y.o. year old male with multiple medical problems including CHF, CAD s/p PCI, COPD and more. Patient had recent hospitalization at the end of Sept due to Ventricular tachycardia. Patient share that he feels that he has been pretty stable since his hospitalization. Patient and spouse share that recently patient was outside doing yard work and did not feel well afterwards, however today patient feels better.   Patient share his appetite has been poor for the past 2 weeks. Patient shares that he does not have much of an appetite and his taste for things he used to enjoy has changed. Patient drinks ensure with his medications BID. Patients current weight is 134.6.   RN reviewed medications and took vitals. RN discussed possibility of appetite stimulant for patient as he has had a loss of appetite of the past few weeks. Patient as new patient appointment with new PCP, Dr. Manuella Ghazi tomorrow 11/8. RN encouraged patient to discuss loss of appetite at appointment.  Psychosocial assessment completed. No physical needs identified at this time. Patient shared that he encountered a situation at his church in discussion with some of board members that made him sad initially  but now feels better about the situation as they have discussed the topic more and came to a resolution. Patient share that he has always been the person people depend on and reach out to for needs, but has not felt adequate support or sympathy from friends or associates in relation to his current medical ailments. Ongoing support will continued to be offered. 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  SW discussed open enrollment period with patient and spouse as they shared patient currently has [facility] but had [employer] in the past and was considering switching back.  PC will continue to follow and provide ongoing support.  3.         PATIENT/CAREGIVER EDUCATION/ COPING:  Patient A&O x3, able to answer questions appropriately. Patient in good spirits today. Patient denies any anxiety or depression at this time. PHQ 9 completed and indicated 0; which suggest no depression and no treatment is required at this time. Patients family is supportive. Patient shared that he has 4 children and number of grandchildren and great grandchildren that love locally. 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  4.         PERSONAL EMERGENCY PLAN:  Patient will call 9-1-1 for emergencies.   5.  COMMUNITY RESOURCES COORDINATION/ HEALTH CARE NAVIGATION:  Patient and spouse manages care.   6.         FINANCIAL/LEGAL CONCERNS/INTERVENTIONS:  None.      SOCIAL HX:  Social History   Tobacco Use   Smoking status: Every Day    Types: Cigars   Smokeless tobacco: Never   Tobacco comments:    smokes cigars now, no longer smokes cigarettes but previously smoked 2ppd  Substance Use Topics   Alcohol use: No    CODE STATUS: DNR ADVANCED DIRECTIVES: N MOST FORM COMPLETE:  N HOSPICE EDUCATION PROVIDED: N  PPS: Patient is independent with all ADL's at  this time, patient has a SPC but does not use an AD a this time. Patient is A&O x3 with average insight and judgement.    Times spent: [place], [place]

## 2020-12-12 ENCOUNTER — Telehealth: Payer: Self-pay | Admitting: Cardiology

## 2020-12-12 MED ORDER — SACUBITRIL-VALSARTAN 97-103 MG PO TABS: 1.0000 | ORAL_TABLET | Freq: Two times a day (BID) | ORAL | 3 refills | Status: DC

## 2020-12-12 NOTE — Telephone Encounter (Signed)
Pt c/o medication issue:  1. Name of Medication: sacubitril-valsartan (ENTRESTO) 97-103 MG  2. How are you currently taking this medication (dosage and times per day)? 1 tablet twice a day  3. Are you having a reaction (difficulty breathing--STAT)? no  4. What is your medication issue? Patient's wife states they were able to get the entresto through [employer], but his dose was increased. She says it was 49-51 mg and was increased to 97-103 mg. She says the new prescription needs to be sent to Fax: [phone]

## 2020-12-12 NOTE — Progress Notes (Signed)
EPIC Encounter for ICM Monitoring  Patient Name: Benjamin Cook is a 75 y.o. male Date: 12/12/2020 Primary Care Physican: Roselee Nova, MD Primary Cardiologist: [name] Electrophysiologist: Allred 11/06/2020 Office Weight: 133 lbs     Spoke with wife per DPR and heart failure questions reviewed.  She reports patient is feeling pretty good but is weak.  Discussed transmission results and diet.  He does eat potato chips and cheese which are both high in salt.   CorVue thoracic impedance suggesting possible fluid accumulation starting 10/30.    Prescribed:  Furosemide 40 mg 0.5 tablet (20 mg total) daily.   Potassium 20 mEq take 1 tablet daily.   Labs: 11/24/2020 Creatinine 1.06, BUN 11, Potassium 4.3, Sodium 141, GFR 72 11/07/2020 Creatinine 1.45, BUN 13, Potassium 4.4, Sodium 145 10/28/2020 Creatinine 1.06, BUN 10, Potassium 3.7, Sodium 134, GFR >60  10/27/2020 Creatinine 0.94, BUN 10, Potassium 4.5, Sodium 135, GFR >60  10/26/2020 Creatinine 1.06, BUN 10, Potassium 4.2, Sodium 137, GFR >60  10/25/2020 Creatinine 1.22, BUN 9,   Potassium 3.7, Sodium 137, GFR >60  10/14/2020 Creatinine 1.08, BUN 7,   Potassium 3.9, Sodium 136, GFR >60  A complete set of results can be found in Results Review.   Recommendations:  Advised to have patient avoid foods high in salt such as the potato chips and cheese.  Encouraged to call if he experiences any fluid symptoms.    Follow-up plan: ICM clinic phone appointment on 12/20/2020 to recheck fluid levels.   91 day device clinic remote transmission 01/24/2021.     EP/Cardiology Office Visits:  Recall 09/13/2020 with Dr. [name].  01/24/2021 with Oda Kilts, PA.   Copy of ICM check sent to Dr. Rayann Heman and Dr [name] for review.   3 month ICM trend: 12/11/2020.    1 Year ICM trend:       [name], RN 12/12/2020 8:42 AM

## 2020-12-12 NOTE — Telephone Encounter (Signed)
Rx updated and sent to [facility].  Wife made aware.

## 2020-12-18 NOTE — Telephone Encounter (Signed)
Benjamin Cook is calling back stating that [facility] did not receive the faxed prescription. She also states a new claim is needing to be submitted for this medication by 12/15. [facility] advised her the have mailed the paperwork to the office. She is requesting a callback from the nurse to discuss it.

## 2020-12-18 NOTE — Telephone Encounter (Signed)
Left message to call back  

## 2020-12-19 NOTE — Telephone Encounter (Signed)
Left message for patient wife, aware I have the provider paperwork for the patient assistance for entresto. Ask her to call and let me know if I need to go ahead in send into the company.

## 2020-12-20 ENCOUNTER — Ambulatory Visit: Payer: Medicare (Managed Care)

## 2020-12-20 ENCOUNTER — Other Ambulatory Visit: Payer: Self-pay | Admitting: *Deleted

## 2020-12-20 DIAGNOSIS — I5022 Chronic systolic (congestive) heart failure: Secondary | ICD-10-CM

## 2020-12-20 DIAGNOSIS — Z95810 Presence of automatic (implantable) cardiac defibrillator: Secondary | ICD-10-CM

## 2020-12-20 MED ORDER — SACUBITRIL-VALSARTAN 97-103 MG PO TABS: 1.0000 | ORAL_TABLET | Freq: Two times a day (BID) | ORAL | 0 refills | Status: DC

## 2020-12-20 NOTE — Progress Notes (Signed)
EPIC Encounter for ICM Monitoring  Patient Name: Benjamin Cook is a 75 y.o. male Date: 12/20/2020 Primary Care Physican: Roselee Nova, MD Primary Cardiologist: [name] Electrophysiologist: Allred 11/06/2020 Office Weight: 133 lbs     Spoke with patient and heart failure questions reviewed.  Pt asymptomatic for fluid accumulation and feeling well.  He said he cut back on eating potato chips.    CorVue thoracic impedance suggesting fluid levels returned to normal.    Prescribed:  Furosemide 40 mg 0.5 tablet (20 mg total) daily.   Potassium 20 mEq take 1 tablet daily.   Labs: 11/24/2020 Creatinine 1.06, BUN 11, Potassium 4.3, Sodium 141, GFR 72 11/07/2020 Creatinine 1.45, BUN 13, Potassium 4.4, Sodium 145 10/28/2020 Creatinine 1.06, BUN 10, Potassium 3.7, Sodium 134, GFR >60  10/27/2020 Creatinine 0.94, BUN 10, Potassium 4.5, Sodium 135, GFR >60  10/26/2020 Creatinine 1.06, BUN 10, Potassium 4.2, Sodium 137, GFR >60  10/25/2020 Creatinine 1.22, BUN 9,   Potassium 3.7, Sodium 137, GFR >60  10/14/2020 Creatinine 1.08, BUN 7,   Potassium 3.9, Sodium 136, GFR >60  A complete set of results can be found in Results Review.   Recommendations:  Recommendation to limit salt intake.  Encouraged to call if experiencing any fluid symptoms.    Follow-up plan: ICM clinic phone appointment on 01/15/2021.   91 day device clinic remote transmission 01/24/2021.     EP/Cardiology Office Visits:  Recall 09/13/2020 with Dr. [name].  01/24/2021 with Oda Kilts, PA.   Copy of ICM check sent to Dr. Rayann Heman.  3 month ICM trend: 12/20/2020.    12-14 Month ICM trend:       [name], RN 12/20/2020 10:13 AM

## 2020-12-21 ENCOUNTER — Other Ambulatory Visit: Payer: Medicare (Managed Care) | Admitting: Nurse Practitioner

## 2020-12-26 ENCOUNTER — Telehealth: Payer: Self-pay | Admitting: *Deleted

## 2020-12-26 NOTE — Telephone Encounter (Signed)
Patient assistance application for entresto for year 2023 faxed to the company.

## 2021-01-01 ENCOUNTER — Other Ambulatory Visit: Payer: Self-pay | Admitting: Student

## 2021-01-15 ENCOUNTER — Ambulatory Visit (INDEPENDENT_AMBULATORY_CARE_PROVIDER_SITE_OTHER): Payer: Medicare (Managed Care)

## 2021-01-15 DIAGNOSIS — Z95810 Presence of automatic (implantable) cardiac defibrillator: Secondary | ICD-10-CM

## 2021-01-15 DIAGNOSIS — I5022 Chronic systolic (congestive) heart failure: Secondary | ICD-10-CM | POA: Diagnosis not present

## 2021-01-17 ENCOUNTER — Other Ambulatory Visit: Payer: Medicare (Managed Care) | Admitting: Nurse Practitioner

## 2021-01-19 NOTE — Progress Notes (Signed)
EPIC Encounter for ICM Monitoring  Patient Name: Benjamin Cook is a 75 y.o. male Date: 01/19/2021 Primary Care Physican: Roselee Nova, MD Primary Cardiologist: [name] Electrophysiologist: Allred 11/06/2020 Office Weight: 133 lbs     Transmission reviewed.    CorVue thoracic impedance suggesting normal fluid levels with interruption of recording 11/9-11/23.    Prescribed:  Furosemide 40 mg 0.5 tablet (20 mg total) daily.   Potassium 20 mEq take 1 tablet daily.   Labs: 11/24/2020 Creatinine 1.06, BUN 11, Potassium 4.3, Sodium 141, GFR 72 11/07/2020 Creatinine 1.45, BUN 13, Potassium 4.4, Sodium 145 10/28/2020 Creatinine 1.06, BUN 10, Potassium 3.7, Sodium 134, GFR >60  10/27/2020 Creatinine 0.94, BUN 10, Potassium 4.5, Sodium 135, GFR >60  10/26/2020 Creatinine 1.06, BUN 10, Potassium 4.2, Sodium 137, GFR >60  10/25/2020 Creatinine 1.22, BUN 9,   Potassium 3.7, Sodium 137, GFR >60  10/14/2020 Creatinine 1.08, BUN 7,   Potassium 3.9, Sodium 136, GFR >60  A complete set of results can be found in Results Review.   Recommendations:  No changes.   Follow-up plan: ICM clinic phone appointment on 02/19/2021.   91 day device clinic remote transmission 01/24/2021.     EP/Cardiology Office Visits:  Recall 09/13/2020 with Dr. [name].  01/24/2021 with Oda Kilts, PA.   Copy of ICM check sent to Dr. Rayann Heman.  3 month ICM trend: 01/15/2021.    12-14 Month ICM trend:       [name], RN 01/19/2021 2:13 PM

## 2021-01-23 NOTE — Progress Notes (Signed)
Electrophysiology Office Note Date: 01/23/2021  ID:  Maher, Shon [birth year], MRN [id number]  PCP: Roselee Nova, MD Primary Cardiologist: Kirk Ruths, MD Electrophysiologist: Thompson Grayer, MD   CC: Routine ICD follow-up  Benjamin Cook is a 75 y.o. male seen today for Thompson Grayer, MD for routine electrophysiology followup.  Since last being seen in our clinic the patient reports doing very well.  he denies chest pain, palpitations, dyspnea, PND, orthopnea, nausea, vomiting, dizziness, syncope, edema, weight gain, or early satiety. He has not had ICD shocks.   Device History: [employer], [employer] ICD implanted 2016 for CHF History of AAD therapy: Yes; currently on amiodarone    Past Medical History:  Diagnosis Date   Adrenal mass ([place])    per pt this is remote (10 years) and benign by biopsy   AICD (automatic cardioverter/defibrillator) present    Anxiety    Arthritis    CAD (coronary artery disease)    a. s/p PCI to LAD 2001 b. myoview 2014 high risk with scar LAD/RCA territory but no ischemia   Cardiomyopathy, ischemic    CHF (congestive heart failure) ([name])    class II/III   COPD (chronic obstructive pulmonary disease) ([place])    smokes cigars but has quit cigarettes   Defibrillator discharge 04/21/2019   Diverticulitis    Dyspnea    Flu 03/2015   HTN (hypertension)    Hyperlipidemia    NSVT (nonsustained ventricular tachycardia) 03/05/2017   Paroxysmal atrial fibrillation ([place]) 03/2015   chads2vasc score is 4   PSA (psoriatic arthritis) ([place])    increased   PVD (peripheral vascular disease) ([place])    Past Surgical History:  Procedure Laterality Date   ABDOMINAL AORTIC ENDOVASCULAR STENT GRAFT N/A 03/03/2017   Procedure: ABDOMINAL AORTIC ENDOVASCULAR STENT GRAFT;  Surgeon: Waynetta Sandy, MD;  Location: [place];  Service: Vascular;  Laterality: N/A;   cataract surgery     CORONARY ANGIOGRAPHY N/A 10/12/2020   Procedure: CORONARY  ANGIOGRAPHY;  Surgeon: Lorretta Harp, MD;  Location: [facility];  Service: Cardiovascular;  Laterality: N/A;   CORONARY ANGIOPLASTY WITH STENT PLACEMENT  2001   a. PCI to LAD   IMPLANTABLE CARDIOVERTER DEFIBRILLATOR (ICD) GENERATOR CHANGE N/A 03/18/2014   a. SJM Fortify ST DR ICD implanted by [facility] for primary prevention b. gen change 03/2014    LEAD REVISION/REPAIR N/A 07/17/2016   Procedure: Lead Revision/Repair;  Surgeon: Evans Lance, MD;  Location: [facility];  Service: Cardiovascular;  Laterality: N/A;   LEFT HEART CATHETERIZATION WITH CORONARY ANGIOGRAM N/A 05/27/2014   Procedure: LEFT HEART CATHETERIZATION WITH CORONARY ANGIOGRAM;  Surgeon: Sherren Mocha, MD;  Location: [facility];  Service: Cardiovascular;  Laterality: N/A;   REPAIR KNEE LIGAMENT     V TACH ABLATION N/A 10/26/2020   Procedure: V TACH ABLATION;  Surgeon: Evans Lance, MD;  Location: [place] [facility];  Service: Cardiovascular;  Laterality: N/A;    Current Outpatient Medications  Medication Sig Dispense Refill   acetaminophen (TYLENOL) 500 MG tablet Take 500-1,000 mg by mouth every 8 (eight) hours as needed (for pain).      amiodarone (PACERONE) 200 MG tablet Take 1 tablet (200 mg total) by mouth daily. 90 tablet 3   atorvastatin (LIPITOR) 80 MG tablet TAKE 1 TABLET BY MOUTH  DAILY AT 6 PM (Patient taking differently: TAKE 1 TABLET BY MOUTH  DAILY AT 6 PM) 90 tablet 3   carvedilol (COREG)  6.25 MG tablet Take 1 tablet (6.25 mg total) by mouth 2 (two) times daily with a meal. 60 tablet 3   empagliflozin (JARDIANCE) 10 MG TABS tablet Take 1 tablet (10 mg total) by mouth daily. (Patient not taking: Reported on 11/24/2020) 30 tablet 3   feeding supplement (ENSURE ENLIVE / ENSURE PLUS) LIQD Try to drink 1 can 1-2 times a day.     Fluticasone-Salmeterol (ADVAIR) 100-50 MCG/DOSE AEPB Inhale 1 puff into the lungs 2 (two) times daily. 1 each 0   furosemide (LASIX) 40 MG tablet Take 0.5 tablets (20 mg total)  by mouth daily. 45 tablet 3   isosorbide mononitrate (IMDUR) 30 MG 24 hr tablet TAKE 1/2 OF A TABLET (15 MG TOTAL) BY MOUTH DAILY 45 tablet 3   melatonin 5 MG TABS Take 1 tablet (5 mg total) by mouth at bedtime. 30 tablet 0   mometasone-formoterol (DULERA) 100-5 MCG/ACT AERO Inhale 2 puffs into the lungs in the morning and at bedtime. 1 each 1   Multiple Vitamin (MULTIVITAMIN) capsule Take 1 capsule by mouth daily.     nitroGLYCERIN (NITROSTAT) 0.4 MG SL tablet Place 1 tablet (0.4 mg total) under the tongue every 5 (five) minutes x 3 doses as needed for chest pain. 25 tablet 12   pantoprazole (PROTONIX) 40 MG tablet Take 1 tablet (40 mg total) by mouth daily. 30 tablet 1   potassium chloride SA (KLOR-CON) 20 MEQ tablet Take 1 tablet (20 mEq total) by mouth daily. 90 tablet 3   Pseudoeph-Doxylamine-DM-APAP (NYQUIL PO) Take 1 Dose by mouth at bedtime as needed (sleep). Heart healty nyquil     sacubitril-valsartan (ENTRESTO) 97-103 MG Take 1 tablet by mouth 2 (two) times daily. 60 tablet 0   XARELTO 20 MG TABS tablet TAKE 1 TABLET BY MOUTH EVERY DAY WITH SUPPER 90 tablet 0   No current facility-administered medications for this visit.    Allergies:   Patient has no known allergies.   Social History: Social History   Socioeconomic History   Marital status: Married    Spouse name: Not on file   Number of children: Not on file   Years of education: Not on file   Highest education level: Not on file  Occupational History   Occupation: Lawn care  Tobacco Use   Smoking status: Every Day    Types: Cigars   Smokeless tobacco: Never   Tobacco comments:    smokes cigars now, no longer smokes cigarettes but previously smoked 2ppd  Vaping Use   Vaping Use: Never used  Substance and Sexual Activity   Alcohol use: No   Drug use: No   Sexual activity: Not Currently    Partners: Female    Birth control/protection: None  Other Topics Concern   Not on file  Social History Narrative   Not on  file   Social Determinants of Health   Financial Resource Strain: Not on file  Food Insecurity: Not on file  Transportation Needs: Not on file  Physical Activity: Not on file  Stress: Not on file  Social Connections: Not on file  Intimate Partner Violence: Not on file    Family History: Family History  Problem Relation Age of Onset   Cirrhosis Mother        due to ETOH   Cancer Neg Hx     Review of Systems: All other systems reviewed and are otherwise negative except as noted above.   Physical Exam: Vitals:   01/24/21 1131  BP:  130/70  Pulse: 60  SpO2: 96%  Weight: 146 lb (66.2 kg)  Height: 5\' 10"  (1.778 m)     GEN- The patient is well appearing, alert and oriented x 3 today.   HEENT: normocephalic, atraumatic; sclera clear, conjunctiva pink; hearing intact; oropharynx clear; neck supple, no JVP Lymph- no cervical lymphadenopathy Lungs- Clear to ausculation bilaterally, normal work of breathing.  No wheezes, rales, rhonchi Heart- Regular rate and rhythm, no murmurs, rubs or gallops, PMI not laterally displaced GI- soft, non-tender, non-distended, bowel sounds present, no hepatosplenomegaly Extremities- no clubbing or cyanosis. No edema; DP/PT/radial pulses 2+ bilaterally MS- no significant deformity or atrophy Skin- warm and dry, no rash or lesion; ICD pocket well healed Psych- euthymic mood, full affect Neuro- strength and sensation are intact  ICD interrogation- reviewed in detail today,  See PACEART report  EKG:  EKG is not ordered today.  Recent Labs: 06/01/2020: B Natriuretic Peptide 127.2 07/12/2020: NT-Pro BNP 309 10/26/2020: Magnesium 2.1 11/07/2020: ALT 47; TSH 0.787 11/24/2020: BUN 11; Creatinine, Ser 1.06; Hemoglobin 11.9; Platelets 253; Potassium 4.3; Sodium 141   Wt Readings from Last 3 Encounters:  12/11/20 134 lb 9.6 oz (61.1 kg)  11/24/20 137 lb 12.8 oz (62.5 kg)  11/06/20 133 lb (60.3 kg)     Other studies Reviewed: Additional studies/  records that were reviewed today include: Previous EP office notes.   Assessment and Plan:  1.  Chronic systolic dysfunction s/p St. Jude dual chamber ICD  euvolemic today Stable on an appropriate medical regimen Normal ICD function See P[facility] Art report No changes today Followed in ICM device clinic Has had issues on and off with dehydration, unsteadiness, ARF with jardiance previously. We have stopped this medicine.  Cardiology team can rechallenge in the future.   2. VT Resolved post ablation Decrease amiodarone to 100 mg daily  Hopefully eventually we can wean this to 100mg  daily and subsequently to off   3. HTN Stable on current regimen    4. Afib Burden <1% Doing well On xarelto for chads2vasc score of 5. No change. Labs today.    5. Gait instability/ dizziness Per PCP.    6. Acute renal failure Update labs today. Could consider rechallenging with jardiance in the future.  Temporally seems have occurred with addition of this medicine   Current medicines are reviewed at length with the patient today.     Labs/ tests ordered today include:  Orders Placed This Encounter  Procedures   CUP PACEART INCLINIC DEVICE CHECK     Disposition:   Follow up with Dr. Lovena Le per Dr. Rayann Heman   Signed, Shirley Friar, PA-C  01/23/2021 11:30 AM  [facility] [address] [place]  [address] [phone] (office) [phone] (fax)

## 2021-01-24 ENCOUNTER — Other Ambulatory Visit: Payer: Self-pay

## 2021-01-24 ENCOUNTER — Encounter: Payer: Self-pay | Admitting: Student

## 2021-01-24 ENCOUNTER — Ambulatory Visit: Payer: Medicare (Managed Care) | Admitting: Student

## 2021-01-24 VITALS — BP 130/70 | HR 60 | Ht 70.0 in | Wt 146.0 lb

## 2021-01-24 DIAGNOSIS — Z95810 Presence of automatic (implantable) cardiac defibrillator: Secondary | ICD-10-CM | POA: Diagnosis not present

## 2021-01-24 DIAGNOSIS — I10 Essential (primary) hypertension: Secondary | ICD-10-CM | POA: Diagnosis not present

## 2021-01-24 DIAGNOSIS — I5022 Chronic systolic (congestive) heart failure: Secondary | ICD-10-CM | POA: Diagnosis not present

## 2021-01-24 DIAGNOSIS — I472 Ventricular tachycardia, unspecified: Secondary | ICD-10-CM

## 2021-01-24 LAB — CUP PACEART INCLINIC DEVICE CHECK
Battery Remaining Longevity: 27 mo
Brady Statistic RA Percent Paced: 27 %
Brady Statistic RV Percent Paced: 1.5 %
Date Time Interrogation Session: 20221221125304
HighPow Impedance: 63 Ohm
Implantable Lead Implant Date: 20120420
Implantable Lead Implant Date: 20180613
Implantable Lead Location: 753859
Implantable Lead Location: 753860
Implantable Pulse Generator Implant Date: 20160212
Lead Channel Impedance Value: 325 Ohm
Lead Channel Impedance Value: 562.5 Ohm
Lead Channel Pacing Threshold Amplitude: 0.5 V
Lead Channel Pacing Threshold Amplitude: 0.5 V
Lead Channel Pacing Threshold Amplitude: 0.75 V
Lead Channel Pacing Threshold Amplitude: 0.75 V
Lead Channel Pacing Threshold Pulse Width: 0.5 ms
Lead Channel Pacing Threshold Pulse Width: 0.5 ms
Lead Channel Pacing Threshold Pulse Width: 0.5 ms
Lead Channel Pacing Threshold Pulse Width: 0.5 ms
Lead Channel Sensing Intrinsic Amplitude: 11.6 mV
Lead Channel Sensing Intrinsic Amplitude: 2.4 mV
Lead Channel Setting Pacing Amplitude: 2 V
Lead Channel Setting Pacing Amplitude: 2.5 V
Lead Channel Setting Pacing Pulse Width: 0.5 ms
Lead Channel Setting Sensing Sensitivity: 0.5 mV
Pulse Gen Serial Number: 7225079

## 2021-01-24 MED ORDER — AMIODARONE HCL 100 MG PO TABS: 100.0000 mg | ORAL_TABLET | Freq: Every day | ORAL | 3 refills | Status: DC

## 2021-01-24 NOTE — Patient Instructions (Signed)
Medication Instructions:  Your physician has recommended you make the following change in your medication:   DECREASE: Amiodarone to 100mg  daily  *If you need a refill on your cardiac medications before your next appointment, please call your pharmacy*   Lab Work: None If you have labs (blood work) drawn today and your tests are completely normal, you will receive your results only by: [place] (if you have MyChart) OR A paper copy in the mail If you have any lab test that is abnormal or we need to change your treatment, we will call you to review the results.   Follow-Up: At [facility], you and your health needs are our priority.  As part of our continuing mission to provide you with exceptional heart care, we have created designated Provider Care Teams.  These Care Teams include your primary Cardiologist (physician) and Advanced Practice Providers (APPs -  Physician Assistants and Nurse Practitioners) who all work together to provide you with the care you need, when you need it.   Your next appointment:   4 month(s)  The format for your next appointment:   In Person  Provider:   You may see Crissie Sickles, MD or one of the following Advanced Practice Providers on your designated Care Team:   Tommye Standard, [place] "[facility]" [place], [place]

## 2021-01-26 ENCOUNTER — Other Ambulatory Visit: Payer: Self-pay | Admitting: Internal Medicine

## 2021-01-26 NOTE — Telephone Encounter (Signed)
Prescription refill request for Xarelto received.  Indication: Afib  Last office visit: 01/24/21 Chalmers Cater)  Weight: 66.2kg Age: 75 Scr: 1.06 (11/24/20)  CrCl: 54.58ml/min  Appropriate dose and refill sent to requested pharmacy.

## 2021-02-08 ENCOUNTER — Other Ambulatory Visit: Payer: Medicare (Managed Care) | Admitting: Nurse Practitioner

## 2021-02-08 ENCOUNTER — Encounter: Payer: Self-pay | Admitting: Nurse Practitioner

## 2021-02-08 ENCOUNTER — Other Ambulatory Visit: Payer: Self-pay

## 2021-02-08 DIAGNOSIS — Z515 Encounter for palliative care: Secondary | ICD-10-CM

## 2021-02-08 DIAGNOSIS — R531 Weakness: Secondary | ICD-10-CM

## 2021-02-08 DIAGNOSIS — R0602 Shortness of breath: Secondary | ICD-10-CM

## 2021-02-08 NOTE — Progress Notes (Signed)
[facility] Palliative Care Consult Note Telephone: [phone]  Fax: [phone]    Date of encounter: 02/08/21 4:50 PM PATIENT NAME: Benjamin Cook [address] [place] [address]   [phone] (home)  DOB: 03-Mar-1945 MRN: [id number] PRIMARY CARE PROVIDER:    Roselee Nova, MD,  [address] [place] [address] [phone]  RESPONSIBLE PARTY:    Contact Information     Name Relation Home Work [place] Spouse [phone]  [phone]      I met face to face with patient and family in home. Palliative Care was asked to follow this patient by consultation request of  Roselee Nova, MD to address advance care planning and complex medical decision making. This is a follow up visit.                                  ASSESSMENT AND PLAN / RECOMMENDATIONS:  Symptom Management/Plan: 1. Advance Care Planning; DNR, in vynca   2. Goals of Care: Goals include to maximize quality of life and symptom management. Our advance care planning conversation included a discussion about:    The value and importance of advance care planning  Exploration of personal, cultural or spiritual beliefs that might influence medical decisions  Exploration of goals of care in the event of a sudden injury or illness  Identification and preparation of a healthcare agent  Review and updating or creation of an advance directive document.   3. Generalized weakness with shortness of breath secondary to due to CHF stable, improved though continues to have fatigue and dyspnea when he tries to walk to mailbox. We talked about energy conservation, importance of self care, rest and sleep pattern/hygiene.   4. Palliative care encounter; Palliative care encounter; Palliative medicine team will continue to support patient, patient's family, and medical team. Visit consisted of counseling and education dealing with the complex and emotionally  intense issues of symptom management and palliative care in the setting of serious and potentially life-threatening illness   5. f/u 12 weeks for ongoing monitoring chronic disease progression, ongoing discussions complex medical decision making   Follow up Palliative Care Visit: Palliative care will continue to follow for complex medical decision making, advance care planning, and clarification of goals. Return 12 weeks or prn.  I spent 62 minutes providing this consultation. More than 50% of the time in this consultation was spent in counseling and care coordination. PPS: 50%  Chief Complaint: Follow up palliative consult for complex medical decision making  HISTORY OF PRESENT ILLNESS:  Benjamin Cook is a 76 y.o. year old male  with multiple medical problems including CHF, CAD s/p PCI, COPD, NSVT s/p AICD, afib, adrenal mass (>26yr benign per Mr. MSpink, HTN, HLD, PVD, tobacco abuse. I [facility]o [facility] and covid screening negative. I visited Mr and Mrs MLarshin their home. We talked about how Mr. MProvincehas been feeling. Mr. MNichelsonendorses since his ablation he has been doing great. Mr. MDeboisendorses no further syncope, dizzy episodes. Mr. MHannersendorses he does have some shortness of breath with exertion. Mr. 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 MMetayerendorses when he tries to walk to the mailbox he has to sit down, he becomes short of breath and fatigue. We talked about endurance, resting, energy conservation. Denies edema.  We talked about chronic disease CHF, Mr. Wirt talked about ablation. We talked about appetite which has improved reflective of weight gain. We talked about medical goals with wishes for quality of life. We talked about what that looks like to Mr. Zollner. Mr. Sleight talked about his adult children, grandchildren with great grandchildren with holidays. We talked about role pc in poc. We talked  about f/u pc visit in 3 months or sooner if declines. Mr. Devereux in agreement, visit scheduled. Therapeutic listening, emotional support provided. Questions answered.   History obtained from review of EMR, discussion with Mrs and Mr. Litzinger.  I reviewed available labs, medications, imaging, studies and related documents from the EMR.  Records reviewed and summarized above.   ROS Reviewed 10point systems all negative except in HPI  Physical Exam: Constitutional: NAD General: frail appearing, thin, pleasant male EYES: lids intact ENMT: oral mucous membranes moist CV: S1S2, RRR, no LE edema Pulmonary: LCTA, no increased work of breathing, no cough, room air Abdomen: soft and non tender MSK: ambulatory Skin: warm and dry Neuro:  no generalized weakness,  no cognitive impairment Psych: non-anxious affect, A and O x 3 Thank you for the opportunity to participate in the care of Mr. Bisono.  The palliative care team will continue to follow. Please call our office at [phone] if we can be of additional assistance.   Questions and concerns were addressed. The patient/family was encouraged to call with questions and/or concerns. My business card was provided. Provided general support and encouragement, no other unmet needs identified   This chart was dictated using voice recognition software.  Despite best efforts to proofread,  errors can occur which can change the documentation meaning.   Zykia Walla Z Hiroko Tregre, NP   COVID-19 PATIENT SCREENING TOOL Asked and negative response unless otherwise noted:   Have you had symptoms of covid, tested positive or been in contact with someone with symptoms/positive test in the past 5-10 days?  NO

## 2021-02-13 NOTE — Telephone Encounter (Signed)
Patient has been approved for entresto patient assistance until 02/03/22.

## 2021-02-16 ENCOUNTER — Other Ambulatory Visit: Payer: Self-pay | Admitting: Cardiology

## 2021-02-19 ENCOUNTER — Ambulatory Visit (INDEPENDENT_AMBULATORY_CARE_PROVIDER_SITE_OTHER): Payer: Medicare (Managed Care)

## 2021-02-19 DIAGNOSIS — I5022 Chronic systolic (congestive) heart failure: Secondary | ICD-10-CM | POA: Diagnosis not present

## 2021-02-19 DIAGNOSIS — Z95810 Presence of automatic (implantable) cardiac defibrillator: Secondary | ICD-10-CM | POA: Diagnosis not present

## 2021-02-21 ENCOUNTER — Telehealth: Payer: Self-pay

## 2021-02-21 NOTE — Telephone Encounter (Signed)
Remote ICM transmission received.  Attempted call to patient regarding ICM remote transmission and no answer or voice mail option.  

## 2021-02-21 NOTE — Progress Notes (Signed)
EPIC Encounter for ICM Monitoring  Patient Name: Benjamin Cook is a 76 y.o. male Date: 02/21/2021 Primary Care Physican: Roselee Nova, MD Primary Cardiologist: [name] Electrophysiologist: Allred 01/24/2021 Office Weight: 146 lbs     Attempted call to patient and unable to reach.   Transmission reviewed.    CorVue thoracic impedance suggesting normal fluid levels.    Prescribed:  Furosemide 40 mg 0.5 tablet (20 mg total) daily.   Potassium 20 mEq take 1 tablet daily.   Labs: 11/24/2020 Creatinine 1.06, BUN 11, Potassium 4.3, Sodium 141, GFR 72 11/07/2020 Creatinine 1.45, BUN 13, Potassium 4.4, Sodium 145 10/28/2020 Creatinine 1.06, BUN 10, Potassium 3.7, Sodium 134, GFR >60  10/27/2020 Creatinine 0.94, BUN 10, Potassium 4.5, Sodium 135, GFR >60  10/26/2020 Creatinine 1.06, BUN 10, Potassium 4.2, Sodium 137, GFR >60  10/25/2020 Creatinine 1.22, BUN 9,   Potassium 3.7, Sodium 137, GFR >60  10/14/2020 Creatinine 1.08, BUN 7,   Potassium 3.9, Sodium 136, GFR >60  A complete set of results can be found in Results Review.   Recommendations:  Unable to reach.     Follow-up plan: ICM clinic phone appointment on 03/26/2021.   91 day device clinic remote transmission 04/25/2021.     EP/Cardiology Office Visits:  Recall 09/13/2020 with Dr. [name].  05/25/2021 with Dr Lovena Le   Copy of ICM check sent to Dr. Rayann Heman.   3 month ICM trend: 02/19/2021.    12-14 Month ICM trend:     [name], RN 02/21/2021 12:14 PM

## 2021-03-07 ENCOUNTER — Other Ambulatory Visit: Payer: Self-pay | Admitting: Cardiology

## 2021-03-26 ENCOUNTER — Ambulatory Visit (INDEPENDENT_AMBULATORY_CARE_PROVIDER_SITE_OTHER): Payer: Medicare (Managed Care)

## 2021-03-26 DIAGNOSIS — Z95810 Presence of automatic (implantable) cardiac defibrillator: Secondary | ICD-10-CM

## 2021-03-26 DIAGNOSIS — I5022 Chronic systolic (congestive) heart failure: Secondary | ICD-10-CM | POA: Diagnosis not present

## 2021-03-28 NOTE — Progress Notes (Signed)
EPIC Encounter for ICM Monitoring  Patient Name: Benjamin Cook is a 76 y.o. male Date: 03/28/2021 Primary Care Physican: Roselee Nova, MD Primary Cardiologist: [name] Electrophysiologist: Allred 01/24/2021 Office Weight: 146 lbs     Transmission reviewed.    CorVue thoracic impedance suggesting normal fluid levels.    Prescribed:  Furosemide 40 mg 0.5 tablet (20 mg total) daily.   Potassium 20 mEq take 1 tablet daily.   Labs: 11/24/2020 Creatinine 1.06, BUN 11, Potassium 4.3, Sodium 141, GFR 72 11/07/2020 Creatinine 1.45, BUN 13, Potassium 4.4, Sodium 145 10/28/2020 Creatinine 1.06, BUN 10, Potassium 3.7, Sodium 134, GFR >60  10/27/2020 Creatinine 0.94, BUN 10, Potassium 4.5, Sodium 135, GFR >60  10/26/2020 Creatinine 1.06, BUN 10, Potassium 4.2, Sodium 137, GFR >60  10/25/2020 Creatinine 1.22, BUN 9,   Potassium 3.7, Sodium 137, GFR >60  10/14/2020 Creatinine 1.08, BUN 7,   Potassium 3.9, Sodium 136, GFR >60  A complete set of results can be found in Results Review.   Recommendations:  No changes.   Follow-up plan: ICM clinic phone appointment on 04/30/2021.   91 day device clinic remote transmission 04/25/2021.     EP/Cardiology Office Visits:  Recall 09/13/2020 with Dr. [name].  05/25/2021 with Dr Lovena Le   Copy of ICM check sent to Dr. Rayann Heman.  3 month ICM trend: 03/26/2021.    12-14 Month ICM trend:     [name], RN 03/28/2021 8:54 AM

## 2021-03-30 ENCOUNTER — Encounter (HOSPITAL_COMMUNITY): Payer: Self-pay | Admitting: Emergency Medicine

## 2021-03-30 ENCOUNTER — Telehealth: Payer: Self-pay | Admitting: Cardiology

## 2021-03-30 ENCOUNTER — Telehealth: Payer: Self-pay

## 2021-03-30 ENCOUNTER — Emergency Department (HOSPITAL_COMMUNITY)
Admission: EM | Admit: 2021-03-30 | Discharge: 2021-03-30 | Disposition: A | Discharge status: Home / Self Care | Payer: Medicare (Managed Care) | Attending: Emergency Medicine | Admitting: Emergency Medicine

## 2021-03-30 ENCOUNTER — Emergency Department (HOSPITAL_COMMUNITY): Payer: Medicare (Managed Care)

## 2021-03-30 ENCOUNTER — Other Ambulatory Visit: Payer: Self-pay

## 2021-03-30 DIAGNOSIS — Z7984 Long term (current) use of oral hypoglycemic drugs: Secondary | ICD-10-CM | POA: Diagnosis not present

## 2021-03-30 DIAGNOSIS — Z7901 Long term (current) use of anticoagulants: Secondary | ICD-10-CM | POA: Diagnosis not present

## 2021-03-30 DIAGNOSIS — I509 Heart failure, unspecified: Secondary | ICD-10-CM | POA: Diagnosis not present

## 2021-03-30 DIAGNOSIS — I2510 Atherosclerotic heart disease of native coronary artery without angina pectoris: Secondary | ICD-10-CM | POA: Insufficient documentation

## 2021-03-30 DIAGNOSIS — R42 Dizziness and giddiness: Secondary | ICD-10-CM | POA: Insufficient documentation

## 2021-03-30 DIAGNOSIS — I110 Hypertensive heart disease with heart failure: Secondary | ICD-10-CM | POA: Insufficient documentation

## 2021-03-30 LAB — COMPREHENSIVE METABOLIC PANEL
ALT: 17 U/L (ref 0–44)
AST: 23 U/L (ref 15–41)
Albumin: 3.4 g/dL — ABNORMAL LOW (ref 3.5–5.0)
Alkaline Phosphatase: 53 U/L (ref 38–126)
Anion gap: 8 (ref 5–15)
BUN: 12 mg/dL (ref 8–23)
CO2: 25 mmol/L (ref 22–32)
Calcium: 8.7 mg/dL — ABNORMAL LOW (ref 8.9–10.3)
Chloride: 105 mmol/L (ref 98–111)
Creatinine, Ser: 1.25 mg/dL — ABNORMAL HIGH (ref 0.61–1.24)
GFR, Estimated: 59 mL/min — ABNORMAL LOW (ref >60)
Glucose, Bld: 129 mg/dL — ABNORMAL HIGH (ref 70–99)
Potassium: 3.9 mmol/L (ref 3.5–5.1)
Sodium: 138 mmol/L (ref 135–145)
Total Bilirubin: 0.6 mg/dL (ref 0.3–1.2)
Total Protein: 6.7 g/dL (ref 6.5–8.1)

## 2021-03-30 LAB — CBC WITH DIFFERENTIAL/PLATELET
Abs Immature Granulocytes: 0.01 10*3/uL (ref 0.00–0.07)
Basophils Absolute: 0.1 10*3/uL (ref 0.0–0.1)
Basophils Relative: 1 %
Eosinophils Absolute: 0.3 10*3/uL (ref 0.0–0.5)
Eosinophils Relative: 5 %
HCT: 35.9 % — ABNORMAL LOW (ref 39.0–52.0)
Hemoglobin: 11.7 g/dL — ABNORMAL LOW (ref 13.0–17.0)
Immature Granulocytes: 0 %
Lymphocytes Relative: 23 %
Lymphs Abs: 1.2 10*3/uL (ref 0.7–4.0)
MCH: 27.2 pg (ref 26.0–34.0)
MCHC: 32.6 g/dL (ref 30.0–36.0)
MCV: 83.5 fL (ref 80.0–100.0)
Monocytes Absolute: 0.6 10*3/uL (ref 0.1–1.0)
Monocytes Relative: 12 %
Neutro Abs: 3.2 10*3/uL (ref 1.7–7.7)
Neutrophils Relative %: 59 %
Platelets: 254 10*3/uL (ref 150–400)
RBC: 4.3 MIL/uL (ref 4.22–5.81)
RDW: 16.9 % — ABNORMAL HIGH (ref 11.5–15.5)
WBC: 5.4 10*3/uL (ref 4.0–10.5)
nRBC: 0 % (ref 0.0–0.2)

## 2021-03-30 LAB — URINALYSIS, ROUTINE W REFLEX MICROSCOPIC
Bilirubin Urine: NEGATIVE
Glucose, UA: NEGATIVE mg/dL
Hgb urine dipstick: NEGATIVE
Ketones, ur: NEGATIVE mg/dL
Leukocytes,Ua: NEGATIVE
Nitrite: NEGATIVE
Protein, ur: NEGATIVE mg/dL
Specific Gravity, Urine: 1.013 (ref 1.005–1.030)
pH: 5 (ref 5.0–8.0)

## 2021-03-30 LAB — TROPONIN I (HIGH SENSITIVITY): Troponin I (High Sensitivity): 19 ng/L — ABNORMAL HIGH (ref <18)

## 2021-03-30 MED ORDER — IOHEXOL 350 MG/ML SOLN
75.0000 mL | Freq: Once | INTRAVENOUS | Status: AC | PRN
  Administered 2021-03-30: 75 mL via INTRAVENOUS

## 2021-03-30 NOTE — Telephone Encounter (Signed)
STAT if patient feels like he/she is going to faint   Are you dizzy now? yes  Do you feel faint or have you passed out? no  Do you have any other symptoms? no  Have you checked your HR and BP (record if available)? 129/65   Feel dizzy when he gets up and tries to walk.  This started yesterday when he jumped out the bed and ran out the door when he though he forgot something.  When he came back he just didn't feel good.

## 2021-03-30 NOTE — ED Provider Notes (Signed)
[facility] EMERGENCY DEPARTMENT Provider Note   CSN: [id number] Arrival date & time: 03/30/21  1406     History  Chief Complaint  Patient presents with   Dizziness    Benjamin Cook is a 76 y.o. male with PMHx CAD, CHF with EF 30-35%, s/p pacemaker AICD, HTN, HLD, PAF on Xarelto who presents to the ED today with complaint of gradual onset, intermittent, dizziness/feeling off balanced that started yesterday around 5 PM. Pt reports he was laying in bed when it began raining. He states he got up very quickly as he was worried about the fertilizer he left out in the yard. He states he noticed that he felt off balanced and felt like he was leaning towards his right side. He states that this has occurred mostly with change in position since that time. He denies any room spinning dizziness. He does report he feels somewhat drunk with ambulation however then states he feels like he could pass out. He called his cardiologist today regarding symptoms who advised he come to the ED for further eval. Report ran on pt's AICD without any events last night. Pt denies specific chest pain. He does mention similar issues in the past when he had "heart issues" prior to having pacemaker/defibrillator placed.   The history is provided by the patient and medical records.      Home Medications Prior to Admission medications   Medication Sig Start Date End Date Taking? Authorizing Provider  acetaminophen (TYLENOL) 500 MG tablet Take 500-1,000 mg by mouth every 8 (eight) hours as needed (for pain).     [provider]  amiodarone (PACERONE) 100 MG tablet Take 1 tablet (100 mg total) by mouth daily. 01/24/21   Shirley Friar, PA-C  atorvastatin (LIPITOR) 80 MG tablet TAKE 1 TABLET BY MOUTH  DAILY AT 6 PM Patient taking differently: TAKE 1 TABLET BY MOUTH  DAILY AT 6 PM 04/19/20   Lelon Perla, MD  carvedilol (COREG) 6.25 MG tablet TAKE 1 TABLET BY MOUTH TWICE A DAY WITH A  MEAL 02/16/21   Tillery, Satira Mccallum, PA-C  empagliflozin (JARDIANCE) 10 MG TABS tablet Take 1 tablet (10 mg total) by mouth daily. Patient not taking: Reported on 11/24/2020 10/17/20   Charlynne Cousins, MD  ENTRESTO 97-103 MG TAKE 1 TABLET BY MOUTH TWICE A DAY 03/07/21   Lelon Perla, MD  feeding supplement (ENSURE ENLIVE / ENSURE PLUS) LIQD Try to drink 1 can 1-2 times a day. 06/04/20   Richardson Dopp T, PA-C  Fluticasone-Salmeterol (ADVAIR) 100-50 MCG/DOSE AEPB Inhale 1 puff into the lungs 2 (two) times daily. 01/07/20   Shirley Friar, PA-C  furosemide (LASIX) 40 MG tablet Take 0.5 tablets (20 mg total) by mouth daily. 04/19/20   Allred, Jeneen Rinks, MD  isosorbide mononitrate (IMDUR) 30 MG 24 hr tablet TAKE 1/2 OF A TABLET (15 MG TOTAL) BY MOUTH DAILY 11/24/20   Allred, Jeneen Rinks, MD  melatonin 5 MG TABS Take 1 tablet (5 mg total) by mouth at bedtime. 10/16/20   Charlynne Cousins, MD  mometasone-formoterol (DULERA) 100-5 MCG/ACT AERO Inhale 2 puffs into the lungs in the morning and at bedtime. 07/12/20   Allred, Jeneen Rinks, MD  Multiple Vitamin (MULTIVITAMIN) capsule Take 1 capsule by mouth daily.    [provider]  nitroGLYCERIN (NITROSTAT) 0.4 MG SL tablet Place 1 tablet (0.4 mg total) under the tongue every 5 (five) minutes x 3 doses as needed for chest pain. 06/04/20 06/04/21  Kathlen Mody, Scott T, PA-C  pantoprazole (PROTONIX) 40 MG tablet Take 1 tablet (40 mg total) by mouth daily. 10/16/20 10/16/21  Charlynne Cousins, MD  potassium chloride SA (KLOR-CON) 20 MEQ tablet Take 1 tablet (20 mEq total) by mouth daily. 12/06/20   Allred, Jeneen Rinks, MD  Pseudoeph-Doxylamine-DM-APAP (NYQUIL PO) Take 1 Dose by mouth at bedtime as needed (sleep). Heart healty nyquil    [provider]  XARELTO 20 MG TABS tablet TAKE 1 TABLET BY MOUTH EVERY DAY WITH SUPPER 01/26/21   Lelon Perla, MD      Allergies    Patient has no known allergies.    Review of Systems   Review of Systems   Constitutional:  Negative for chills and fever.  Eyes:  Negative for visual disturbance.  Respiratory:  Negative for shortness of breath.   Cardiovascular:  Negative for chest pain.  Gastrointestinal:  Negative for nausea and vomiting.  Neurological:  Positive for dizziness and light-headedness. Negative for syncope, speech difficulty, weakness, numbness and headaches.  All other systems reviewed and are negative.  Physical Exam Updated Vital Signs BP (!) 158/75    Pulse (!) 59    Temp 97.6 F (36.4 C) (Oral)    Resp (!) 24    SpO2 98%  Physical Exam Vitals and nursing note reviewed.  Constitutional:      Appearance: He is not ill-appearing.  HENT:     Head: Normocephalic and atraumatic.  Eyes:     Extraocular Movements: Extraocular movements intact.     Conjunctiva/sclera: Conjunctivae normal.     Pupils: Pupils are equal, round, and reactive to light.  Cardiovascular:     Rate and Rhythm: Normal rate and regular rhythm.  Pulmonary:     Effort: Pulmonary effort is normal.     Breath sounds: Normal breath sounds. No wheezing, rhonchi or rales.  Abdominal:     Palpations: Abdomen is soft.     Tenderness: There is no abdominal tenderness. There is no guarding or rebound.  Musculoskeletal:     Cervical back: Neck supple.  Skin:    General: Skin is warm and dry.  Neurological:     Mental Status: He is alert.     Comments: Alert and oriented to self, place, time and event.   Speech is fluent, clear without dysarthria or dysphasia.   Strength 5/5 in upper/lower extremities   Sensation intact in upper/lower extremities   Ambulated with steady gait.  Negative Romberg. No pronator drift.  Normal finger-to-nose and feet tapping.  CN I not tested  CN II grossly intact visual fields bilaterally. Did not visualize posterior eye.  CN III, IV, VI PERRLA and EOMs intact bilaterally  CN V Intact sensation to sharp and light touch to the face  CN VII facial movements symmetric  CN  VIII not tested  CN IX, X no uvula deviation, symmetric rise of soft palate  CN XI 5/5 SCM and trapezius strength bilaterally  CN XII Midline tongue protrusion, symmetric L/R movements     ED Results / Procedures / Treatments   Labs (all labs ordered are listed, but only abnormal results are displayed) Labs Reviewed  COMPREHENSIVE METABOLIC PANEL - Abnormal; Notable for the following components:      Result Value   Glucose, Bld 129 (*)    Creatinine, Ser 1.25 (*)    Calcium 8.7 (*)    Albumin 3.4 (*)    GFR, Estimated 59 (*)    All other components within normal  limits  CBC WITH DIFFERENTIAL/PLATELET - Abnormal; Notable for the following components:   Hemoglobin 11.7 (*)    HCT 35.9 (*)    RDW 16.9 (*)    All other components within normal limits  TROPONIN I (HIGH SENSITIVITY) - Abnormal; Notable for the following components:   Troponin I (High Sensitivity) 19 (*)    All other components within normal limits  URINALYSIS, ROUTINE W REFLEX MICROSCOPIC    EKG EKG Interpretation  Date/Time:  Friday March 30 2021 14:16:45 EST Ventricular Rate:  63 PR Interval:  178 QRS Duration: 178 QT Interval:  490 QTC Calculation: 501 R Axis:   -68 Text Interpretation: Normal sinus rhythm Right bundle branch block Left anterior fascicular block ** Bifascicular block ** Inferior infarct , age undetermined T wave abnormality, consider lateral ischemia Abnormal ECG when compared to prior, similar abnormalities to prior. No STEMI Confirmed by Antony Blackbird [phone]) on 03/30/2021 4:44:17 PM  Radiology CT Angio Head W or Wo Contrast  Result Date: 03/30/2021 CLINICAL DATA:  Dizziness, prior strokes EXAM: CT ANGIOGRAPHY HEAD AND NECK TECHNIQUE: Multidetector CT imaging of the head and neck was performed using the standard protocol during bolus administration of intravenous contrast. Multiplanar CT image reconstructions and MIPs were obtained to evaluate the vascular anatomy. Carotid stenosis  measurements (when applicable) are obtained utilizing NASCET criteria, using the distal internal carotid diameter as the denominator. RADIATION DOSE REDUCTION: This exam was performed according to the departmental dose-optimization program which includes automated exposure control, adjustment of the mA and/or kV according to patient size and/or use of iterative reconstruction technique. CONTRAST:  18mL OMNIPAQUE IOHEXOL 350 MG/ML SOLN COMPARISON:  No prior CTA available, correlation is made with CT head 03/30/2021. FINDINGS: CT HEAD FINDINGS For noncontrast findings, please see same day CT head. CTA NECK FINDINGS Aortic arch: Standard branching. Imaged portion shows no evidence of aneurysm or dissection. Aortic atherosclerosis, which extends into the arch branch vessels, without hemodynamically significant stenosis. Approximately 30% narrowing in the proximal left subclavian secondary to calcified and noncalcified plaque. Right carotid system: No dissection or occlusion. Multifocal calcified and noncalcified narrowing in the common carotid artery, which causes approximately 50% stenosis. Multifocal calcifications in the proximal and distal right ICA are not hemodynamically significant (less than 50% stenosis). Moderate narrowing is also seen at the origin and in the proximal right ECA. Left carotid system: No dissection or occlusion. Calcifications in the common and internal carotid artery are not hemodynamically significant. Mild-to-moderate narrowing at the origin and in the proximal left ECA. Vertebral arteries: Moderate to severe narrowing at the origin of the bilateral vertebral arteries, secondary to calcified and noncalcified plaque. Mild narrowing in the proximal right V2 segment, secondary to noncalcified plaque. Multifocal calcifications in the vertebral arteries do not cause greater than 50% stenosis. No dissection or occlusion. Skeleton: Multifocal sclerotic lesions in the cervical spine, most likely  degenerative given the absence of other sclerotic lesions in the upper thoracic spine or on the 10/14/2020 abdomen pelvis CT. Reversal of the normal cervical lordosis, with degenerative fusion of C3 and C4. Other neck: No acute finding. Upper chest: Severe centrilobular and paraseptal emphysema. No focal pulmonary opacity or pleural effusion. Apical pleural-parenchymal scarring. Probably just deep Review of the MIP images confirms the above findings CTA HEAD Anterior circulation: Both internal carotid arteries are patent to the termini, with multifocal calcifications in the left-greater-than-right cavernous and right-greater-than-left supraclinoid segments, which cause up to moderate stenosis. A1 segments patent, although the left A1 is hypoplastic or  stenotic. Azygous ACA. Anterior cerebral arteries are patent to their distal aspects. No M1 stenosis or occlusion. Normal MCA bifurcations. Distal MCA branches perfused and symmetric. Posterior circulation: Vertebral arteries patent to the vertebrobasilar junction, with multifocal calcifications, which cause mild stenosis bilaterally. Posterior inferior cerebral arteries patent bilaterally. Basilar patent to its distal aspect. Superior cerebellar arteries patent bilaterally. Patent P1 segments. PCAs perfused to their distal aspects without stenosis. The bilateral posterior communicating arteries are not visualized. Venous sinuses: As permitted by contrast timing, patent. Anatomic variants: None significant. Review of the MIP images confirms the above findings IMPRESSION: 1. No intracranial large vessel occlusion. Moderate stenosis in the right supraclinoid and left cavernous ICAs, with additional mild stenosis in the bilateral V4 segments. 2. Approximate 50% stenosis of the right common carotid artery, with moderate narrowing at the origin in the proximal right ECA but no hemodynamically significant stenosis of the right ICA. 3. No hemodynamically significant stenosis  in the left common or internal carotid. Mild-to-moderate narrowing of the origin and in the proximal left ECA. 4. Moderate to severe narrowing at the origin of the bilateral vertebral arteries, with additional mild narrowing in the proximal right V2 segment. 5. 30% narrowing of the proximal left subclavian artery, secondary to calcified and noncalcified plaque. 6. Aortic Atherosclerosis (ICD10-I70.0) and Emphysema (ICD10-J43.9). 7. Sclerotic vertebral bodies in the cervical spine, which are favored to be degenerative given the absence of other sclerotic lesions in the upper thoracic spine on the current exam or in the lumbar spine on the 10/14/2020 abdomen pelvis CT. If the patient has a history of prostate cancer, however, these could represent metastatic disease. Electronically Signed   By: Merilyn Baba M.D.   On: 03/30/2021 21:06   CT HEAD WO CONTRAST (5MM)  Result Date: 03/30/2021 CLINICAL DATA:  Ataxia EXAM: CT HEAD WITHOUT CONTRAST TECHNIQUE: Contiguous axial images were obtained from the base of the skull through the vertex without intravenous contrast. RADIATION DOSE REDUCTION: This exam was performed according to the departmental dose-optimization program which includes automated exposure control, adjustment of the mA and/or kV according to patient size and/or use of iterative reconstruction technique. COMPARISON:  None. FINDINGS: Brain: No acute intracranial findings are seen. There is no significant dilation of ventricles. Cortical sulci are prominent. There is decreased density in the subcortical white matter in the right frontal lobe, possibly encephalomalacia. There is decreased density in the posteromedial left parietal cortex. This may suggest encephalomalacia from previous infarction or cortical atrophy. Similar finding to a lesser extent is seen in the posteromedial right parietal cortex. There is decreased density in the periventricular and subcortical white matter. Vascular: There are  scattered arterial calcifications. Skull: No fracture is seen. Sinuses/Orbits: Unremarkable. Other: Degenerative changes are noted in the visualized upper cervical spine with facet hypertrophy. IMPRESSION: No acute intracranial findings are seen in noncontrast CT brain. Possible small old infarcts are seen in the right frontal and both parietal lobes. Atrophy. Cervical spondylosis. Electronically Signed   By: Elmer Picker M.D.   On: 03/30/2021 15:00   CT Angio Neck W and/or Wo Contrast  Result Date: 03/30/2021 CLINICAL DATA:  Dizziness, prior strokes EXAM: CT ANGIOGRAPHY HEAD AND NECK TECHNIQUE: Multidetector CT imaging of the head and neck was performed using the standard protocol during bolus administration of intravenous contrast. Multiplanar CT image reconstructions and MIPs were obtained to evaluate the vascular anatomy. Carotid stenosis measurements (when applicable) are obtained utilizing NASCET criteria, using the distal internal carotid diameter as the denominator. RADIATION DOSE  REDUCTION: This exam was performed according to the departmental dose-optimization program which includes automated exposure control, adjustment of the mA and/or kV according to patient size and/or use of iterative reconstruction technique. CONTRAST:  80mL OMNIPAQUE IOHEXOL 350 MG/ML SOLN COMPARISON:  No prior CTA available, correlation is made with CT head 03/30/2021. FINDINGS: CT HEAD FINDINGS For noncontrast findings, please see same day CT head. CTA NECK FINDINGS Aortic arch: Standard branching. Imaged portion shows no evidence of aneurysm or dissection. Aortic atherosclerosis, which extends into the arch branch vessels, without hemodynamically significant stenosis. Approximately 30% narrowing in the proximal left subclavian secondary to calcified and noncalcified plaque. Right carotid system: No dissection or occlusion. Multifocal calcified and noncalcified narrowing in the common carotid artery, which causes  approximately 50% stenosis. Multifocal calcifications in the proximal and distal right ICA are not hemodynamically significant (less than 50% stenosis). Moderate narrowing is also seen at the origin and in the proximal right ECA. Left carotid system: No dissection or occlusion. Calcifications in the common and internal carotid artery are not hemodynamically significant. Mild-to-moderate narrowing at the origin and in the proximal left ECA. Vertebral arteries: Moderate to severe narrowing at the origin of the bilateral vertebral arteries, secondary to calcified and noncalcified plaque. Mild narrowing in the proximal right V2 segment, secondary to noncalcified plaque. Multifocal calcifications in the vertebral arteries do not cause greater than 50% stenosis. No dissection or occlusion. Skeleton: Multifocal sclerotic lesions in the cervical spine, most likely degenerative given the absence of other sclerotic lesions in the upper thoracic spine or on the 10/14/2020 abdomen pelvis CT. Reversal of the normal cervical lordosis, with degenerative fusion of C3 and C4. Other neck: No acute finding. Upper chest: Severe centrilobular and paraseptal emphysema. No focal pulmonary opacity or pleural effusion. Apical pleural-parenchymal scarring. Probably just deep Review of the MIP images confirms the above findings CTA HEAD Anterior circulation: Both internal carotid arteries are patent to the termini, with multifocal calcifications in the left-greater-than-right cavernous and right-greater-than-left supraclinoid segments, which cause up to moderate stenosis. A1 segments patent, although the left A1 is hypoplastic or stenotic. Azygous ACA. Anterior cerebral arteries are patent to their distal aspects. No M1 stenosis or occlusion. Normal MCA bifurcations. Distal MCA branches perfused and symmetric. Posterior circulation: Vertebral arteries patent to the vertebrobasilar junction, with multifocal calcifications, which cause mild  stenosis bilaterally. Posterior inferior cerebral arteries patent bilaterally. Basilar patent to its distal aspect. Superior cerebellar arteries patent bilaterally. Patent P1 segments. PCAs perfused to their distal aspects without stenosis. The bilateral posterior communicating arteries are not visualized. Venous sinuses: As permitted by contrast timing, patent. Anatomic variants: None significant. Review of the MIP images confirms the above findings IMPRESSION: 1. No intracranial large vessel occlusion. Moderate stenosis in the right supraclinoid and left cavernous ICAs, with additional mild stenosis in the bilateral V4 segments. 2. Approximate 50% stenosis of the right common carotid artery, with moderate narrowing at the origin in the proximal right ECA but no hemodynamically significant stenosis of the right ICA. 3. No hemodynamically significant stenosis in the left common or internal carotid. Mild-to-moderate narrowing of the origin and in the proximal left ECA. 4. Moderate to severe narrowing at the origin of the bilateral vertebral arteries, with additional mild narrowing in the proximal right V2 segment. 5. 30% narrowing of the proximal left subclavian artery, secondary to calcified and noncalcified plaque. 6. Aortic Atherosclerosis (ICD10-I70.0) and Emphysema (ICD10-J43.9). 7. Sclerotic vertebral bodies in the cervical spine, which are favored to be degenerative given the  absence of other sclerotic lesions in the upper thoracic spine on the current exam or in the lumbar spine on the 10/14/2020 abdomen pelvis CT. If the patient has a history of prostate cancer, however, these could represent metastatic disease. Electronically Signed   By: Merilyn Baba M.D.   On: 03/30/2021 21:06    Procedures Procedures    Medications Ordered in ED Medications  iohexol (OMNIPAQUE) 350 MG/ML injection 75 mL (75 mLs Intravenous Contrast Given 03/30/21 2036)    ED Course/ Medical Decision Making/ A&P                            Medical Decision Making 76 year old male who presents to the ED with complaint of dizziness/feeling like leaning towards right side that began yesterday around 5 PM.  No other complaints.  Patient advised by cardiology to come here for further eval.  History of pacemaker and AICD, cardiology ran report prior to patient coming to ED, no acute events.  On arrival to the ED vitals are stable.  Patient appears to be no acute distress.  He does report feeling improved from previous however still slightly is dizzy.  Patient was medically screened and work-up started in the waiting room including CT head which did not show any acute findings however does show possible small old infarcts in the right frontal and both parietal lobes.  Patient denies history of stroke in the past.  No obvious focal neurodeficits on exam today however with concern for old strokes on CT scan do feel patient requires additional work-up with concern for stroke.  MRI ordered at this time as patient appears to have MRI compatible device.  He did have a CBC and CMP done in the waiting room however with history of CAD will add on troponin at this time and plan for urinalysis to assess for any UTI causing symptoms.  Work-up overall reassuring at this time.  No acute findings to account for patient's dizziness.  Unfortunately he is unable to have MRI until Monday however do not feel patient requires admission at this time for same.  Have consulted neurology who agrees.  Patient can follow-up outpatient he is properly anticoagulated on Xarelto.  Recommended to follow-up with his PCP for same as well as findings on CT scan of carotic vertebral bodies and the C-spine for further work-up.  He is in agreement with plan and stable for discharge.  Amount and/or Complexity of Data Reviewed Labs: ordered.    Details: CBC without leukocytosis. Hgb stable at 11.7. CMP with glucose 129. Creatinine only slightly elevated at 1.25; not consistent  with AKI as previous around 1.0.  Troponin of 19; unchanged from previous U/A negative Radiology: ordered.    Details: CTA head and neck with out signs of LVO.  Patient is noted to have moderate stenosis throughout various vessels.  He also has sclerotic vertebral bodies in the C-spine.  Radiology states these are favored to be degenerative given absence of other sclerotic lesions in the upper T-spine however if patient has a history of prostate cancer these could represent metastatic disease.  Patient denies history of metastatic disease.  We will have patient follow-up outpatient with PCP for same. Discussion of management or test interpretation with external provider(s): Discussed case with MRI tech.  Patient does have MRI compatible device however given pacemaker they are only able to do MRI starting Monday.  We will plan to consult neurology at this time to  see if patient requires CTAs versus admission for stroke work-up.  Discussed case with neurologist Dr. Rory Percy regarding inability to obtain MRI until Monday. Recommends CTA Head and Neck at this time.  If negative plan to discharge home with outpatient follow-up as patient is anticoagulated on Xarelto.  Risk Prescription drug management.          Final Clinical Impression(s) / ED Diagnoses Final diagnoses:  Dizziness    Rx / DC Orders ED Discharge Orders     None        Discharge Instructions      Please follow-up with your PCP for further evaluation of your dizziness.  Your CT scan did show findings concerning for old strokes in the past.  You may require an MRI for complete further evaluation.   You were found to have bony lesions in your neck that require further evaluation by PCP as well.  Return to the ED for any new/worsening symptoms.         Eustaquio Maize, PA-C 03/30/21 2151    Tegeler, Gwenyth Allegra, MD 03/30/21 2322

## 2021-03-30 NOTE — Telephone Encounter (Signed)
Transmission received and reviewed. Normal device function. Presenting rhythm AS/VS 63. No arrhythmias noted last evening.

## 2021-03-30 NOTE — Discharge Instructions (Signed)
Please follow-up with your PCP for further evaluation of your dizziness.  Your CT scan did show findings concerning for old strokes in the past.  You may require an MRI for complete further evaluation.   You were found to have bony lesions in your neck that require further evaluation by PCP as well.  Return to the ED for any new/worsening symptoms.

## 2021-03-30 NOTE — ED Provider Triage Note (Signed)
Emergency Medicine Provider Triage Evaluation Note  EADEN HETTINGER , a 76 y.o. male  was evaluated in triage.  Pt complains of ataxia.  Ataxia has been present since yesterday afternoon at 2 PM.  Patient reports that he quickly got out of bed to check on his house and since then he has had a feeling that his balance was off and he was going to fall over.  Patient denies any recent falls or injury.  Denies any visual disturbance, lightheadedness, dizziness, syncope, chest pain, shortness of breath, palpitations.  Patient has Environmental health practitioner ICD.  Review of Systems  Positive: Ataxia Negative: See above  Physical Exam  BP (!) 144/67    Pulse 66    Temp 97.6 F (36.4 C) (Oral)    Resp 14    SpO2 98%  Gen:   Awake, no distress   Resp:  Normal effort  MSK:   Moves extremities without difficulty  Other:  No facial asymmetry or dysarthria.  Normal finger-nose, negative pronator drift.  Sensation to light touch grossly intact to bilateral upper and lower extremities.  Patient moves all limbs equally without difficulty.  Medical Decision Making  Medically screening exam initiated at 2:21 PM.  Appropriate orders placed.  [name] was informed that the remainder of the evaluation will be completed by another provider, this initial triage assessment does not replace that evaluation, and the importance of remaining in the ED until their evaluation is complete.     Loni Beckwith, [place] 03/30/21 1423

## 2021-03-30 NOTE — ED Triage Notes (Signed)
Pt here POV d/t dizziness and loss of balance. Pt feels as if he will just fall over. Denies CP, denies weakness to extremities. Denies visual disturbances. Had [employer] pacemake/defib.

## 2021-03-30 NOTE — Telephone Encounter (Signed)
Patient reports that last night he got up quickly to run outside for something and has felt dizzy ever since. Last pm BP 129/65 and now 155/74, P 65. He feels like he wants to faint and said he wants to go to the ED. I had them send a transmission to [facility] device. Patient and spouse want to go to the ED. I told them to let the ED know he has a device when they get there.

## 2021-04-11 ENCOUNTER — Other Ambulatory Visit: Payer: Self-pay | Admitting: Cardiology

## 2021-04-24 ENCOUNTER — Non-Acute Institutional Stay: Payer: Medicare (Managed Care) | Admitting: Nurse Practitioner

## 2021-04-24 ENCOUNTER — Telehealth: Payer: Self-pay | Admitting: Nurse Practitioner

## 2021-04-24 ENCOUNTER — Other Ambulatory Visit: Payer: Self-pay

## 2021-04-24 NOTE — Telephone Encounter (Signed)
I called Benjamin Cook to confirm [facility] f/u visit, Ms. Henricks endorses Mr Aspinall has been feeling great. Ms. Lankford requested to reschedule. Rescheduled per request ?

## 2021-04-25 ENCOUNTER — Ambulatory Visit (INDEPENDENT_AMBULATORY_CARE_PROVIDER_SITE_OTHER): Payer: Medicare (Managed Care)

## 2021-04-25 DIAGNOSIS — I472 Ventricular tachycardia, unspecified: Secondary | ICD-10-CM

## 2021-04-26 LAB — CUP PACEART REMOTE DEVICE CHECK
Battery Remaining Longevity: 26 mo
Battery Remaining Percentage: 27 %
Battery Voltage: 2.87 V
Brady Statistic AP VP Percent: 1.5 %
Brady Statistic AP VS Percent: 29 %
Brady Statistic AS VP Percent: 2.4 %
Brady Statistic AS VS Percent: 67 %
Brady Statistic RA Percent Paced: 29 %
Brady Statistic RV Percent Paced: 3.9 %
Date Time Interrogation Session: 20230322030046
HighPow Impedance: 66 Ohm
HighPow Impedance: 66 Ohm
Implantable Lead Implant Date: 20120420
Implantable Lead Implant Date: 20180613
Implantable Lead Location: 753859
Implantable Lead Location: 753860
Implantable Pulse Generator Implant Date: 20160212
Lead Channel Impedance Value: 310 Ohm
Lead Channel Impedance Value: 600 Ohm
Lead Channel Pacing Threshold Amplitude: 0.5 V
Lead Channel Pacing Threshold Amplitude: 0.75 V
Lead Channel Pacing Threshold Pulse Width: 0.5 ms
Lead Channel Pacing Threshold Pulse Width: 0.5 ms
Lead Channel Sensing Intrinsic Amplitude: 1.2 mV
Lead Channel Sensing Intrinsic Amplitude: 11.6 mV
Lead Channel Setting Pacing Amplitude: 2 V
Lead Channel Setting Pacing Amplitude: 2.5 V
Lead Channel Setting Pacing Pulse Width: 0.5 ms
Lead Channel Setting Sensing Sensitivity: 0.5 mV
Pulse Gen Serial Number: 7225079

## 2021-04-30 ENCOUNTER — Other Ambulatory Visit: Payer: Self-pay | Admitting: Physician Assistant

## 2021-04-30 ENCOUNTER — Ambulatory Visit (INDEPENDENT_AMBULATORY_CARE_PROVIDER_SITE_OTHER): Payer: Medicare (Managed Care)

## 2021-04-30 ENCOUNTER — Telehealth: Payer: Self-pay | Admitting: Cardiology

## 2021-04-30 ENCOUNTER — Other Ambulatory Visit: Payer: Self-pay | Admitting: Cardiology

## 2021-04-30 ENCOUNTER — Other Ambulatory Visit: Payer: Self-pay | Admitting: Student

## 2021-04-30 DIAGNOSIS — I480 Paroxysmal atrial fibrillation: Secondary | ICD-10-CM

## 2021-04-30 DIAGNOSIS — I5022 Chronic systolic (congestive) heart failure: Secondary | ICD-10-CM

## 2021-04-30 DIAGNOSIS — Z95810 Presence of automatic (implantable) cardiac defibrillator: Secondary | ICD-10-CM

## 2021-04-30 NOTE — Telephone Encounter (Signed)
Pt last saw Oda Kilts, [place] on 01/24/21, last labs 03/30/21 Creat 1.25, age 76, weight 66.2kg, CrCl 46.34, based on CrCl pt is not on appropriate dosage of Xarelto '20mg'$  QD.  CrCl 15-50 pt should be on Xarelto '15mg'$  QD.  Please advise if change appropriate. Thanks ?

## 2021-04-30 NOTE — Telephone Encounter (Signed)
?*  STAT* If patient is at the pharmacy, call can be transferred to refill team. ? ? ?1. Which medications need to be refilled? (please list name of each medication and dose if known)  ?XARELTO 20 MG TABS tablet ? ?2. Which pharmacy/location (including street and city if local pharmacy) is medication to be sent to? ?[id number] [place], [place] - [address]? ?3. Do they need a 30 day or 90 day supply?  ?30 day supply ? ?

## 2021-04-30 NOTE — Telephone Encounter (Signed)
Prescription refill request for Xarelto received.  ?Indication:Afib ?Last office visit:12/22 ?Weight:66.2 kg ?Age:76 ?Scr:1.2 ?CrCl:48.27 ml/min ? ?May need to reduce dose. ? ?

## 2021-05-01 MED ORDER — RIVAROXABAN 15 MG PO TABS: 15.0000 mg | ORAL_TABLET | Freq: Every day | ORAL | 2 refills | Status: DC

## 2021-05-01 NOTE — Telephone Encounter (Signed)
Prescription refill request for Xarelto received. ? ?Indication: afib  ?Last office visit: Chalmers Cater 01/24/2021 ?Weight: 66.2 kg  ?Age: 76 yo  ?Scr: 1.25, 03/30/2020 ?CrCl: 46 ml/min  ? ? ?Per dosing criteria pt qualifies for  a dose change. Per Rutha Bouchard D okay to change pt's Xarelto's dose to Xarelto '15mg'$  daily.  With lab follow up in 3 months.  ? ?Called and spoke to pt, who stated that he is out of Xarelto. Made him aware that Xarelto dose would be changing to Xarelto '15mg'$  daily ( 1 tablet daily.) Pt verbalized understanding. Informed pt and scheduled him to come in June for follow up blood work. ?

## 2021-05-01 NOTE — Telephone Encounter (Signed)
Yes, ok to reduce dose. Recommend recheck BMP in 3 months ?

## 2021-05-02 NOTE — Progress Notes (Signed)
EPIC Encounter for ICM Monitoring ? ?Patient Name: Benjamin Cook is a 76 y.o. male ?Date: 05/02/2021 ?Primary Care Physican: Roselee Nova, MD ?Primary Cardiologist: [name] ?Electrophysiologist: Allred ?01/24/2021 Office Weight: 146 lbs ?  ?  ?Transmission reviewed.  ?  ?CorVue thoracic impedance suggesting normal fluid levels. ?   ?Prescribed:  ?Furosemide 40 mg 0.5 tablet (20 mg total) daily.   ?Potassium 20 mEq take 1 tablet daily. ?  ?Labs: ?03/30/2021 Creatinine 1.25, BUN 12, Potassium 3.9, Sodium 138, GFR 59 ?A complete set of results can be found in Results Review. ?  ?Recommendations:  No changes. ?  ?Follow-up plan: ICM clinic phone appointment on 06/04/2021.   91 day device clinic remote transmission 07/20/2021.   ?  ?EP/Cardiology Office Visits:  Recall 09/13/2020 with Dr. [name].  05/25/2021 with Dr Lovena Le ?  ?Copy of ICM check sent to Dr. Rayann Heman.  ? ?3 month ICM trend: 04/30/2021. ? ? ? ?12-14 Month ICM trend:  ? ? ? ?[name], RN ?05/02/2021 ?4:37 PM ? ?

## 2021-05-08 NOTE — Progress Notes (Signed)
Remote ICD transmission.   

## 2021-05-25 ENCOUNTER — Ambulatory Visit (INDEPENDENT_AMBULATORY_CARE_PROVIDER_SITE_OTHER): Payer: Medicare (Managed Care) | Admitting: Internal Medicine

## 2021-05-25 ENCOUNTER — Other Ambulatory Visit: Payer: Self-pay | Admitting: Internal Medicine

## 2021-05-25 ENCOUNTER — Encounter: Payer: Self-pay | Admitting: Internal Medicine

## 2021-05-25 VITALS — BP 122/68 | HR 60 | Ht 71.0 in | Wt 150.6 lb

## 2021-05-25 DIAGNOSIS — I5022 Chronic systolic (congestive) heart failure: Secondary | ICD-10-CM

## 2021-05-25 DIAGNOSIS — I10 Essential (primary) hypertension: Secondary | ICD-10-CM

## 2021-05-25 DIAGNOSIS — I472 Ventricular tachycardia, unspecified: Secondary | ICD-10-CM | POA: Diagnosis not present

## 2021-05-25 DIAGNOSIS — I480 Paroxysmal atrial fibrillation: Secondary | ICD-10-CM | POA: Diagnosis not present

## 2021-05-25 DIAGNOSIS — Z95810 Presence of automatic (implantable) cardiac defibrillator: Secondary | ICD-10-CM

## 2021-05-25 NOTE — Patient Instructions (Signed)
Medication Instructions:  ?Your physician recommends that you continue on your current medications as directed. Please refer to the Current Medication list given to you today. ? ?Labwork: ?None ordered. ? ?Testing/Procedures: ?None ordered. ? ?Follow-Up: ?Your physician wants you to follow-up in: one year with Cristopher Peru, MD or one of the following Advanced Practice Providers on your designated Care Team:   ?Tommye Standard, PA-C ?Legrand Como "Jonni Sanger" [place], PA-C ? ?Remote monitoring is used to monitor your ICD from home. This monitoring reduces the number of office visits required to check your device to one time per year. It allows [place] to keep an eye on the functioning of your device to ensure it is working properly. You are scheduled for a device check from home on 06/04/2021. You may send your transmission at any time that day. If you have a wireless device, the transmission will be sent automatically. After your physician reviews your transmission, you will receive a postcard with your next transmission date. ? ?Any Other Special Instructions Will Be Listed Below (If Applicable). ? ?If you need a refill on your cardiac medications before your next appointment, please call your pharmacy.  ? ?Important Information About Sugar ? ? ? ? ? ? ? ?

## 2021-05-27 NOTE — Progress Notes (Signed)
? ? ? ? ?HPI ?Benjamin Cook returns today for followup. He is a pleasant 76 yo man who has been followed by Dr. Greggory Brandy in the past. He was referred to me in the fall for incessant VT and underwent VT ablation which has been very successful with no recurrent VT. He is on only low dose amiodarone. He feels well though he has been sedentary. No chest pain or sob. No ICD therapies. ?No Known Allergies ? ? ?Current Outpatient Medications  ?Medication Sig Dispense Refill  ? acetaminophen (TYLENOL) 500 MG tablet Take 500-1,000 mg by mouth every 8 (eight) hours as needed (for pain).     ? amiodarone (PACERONE) 100 MG tablet Take 1 tablet (100 mg total) by mouth daily. 90 tablet 3  ? atorvastatin (LIPITOR) 80 MG tablet TAKE 1 TABLET BY MOUTH DAILY AT 6 PM 90 tablet 1  ? carvedilol (COREG) 6.25 MG tablet TAKE 1 TABLET BY MOUTH TWICE A DAY WITH MEALS 180 tablet 2  ? empagliflozin (JARDIANCE) 10 MG TABS tablet Take 1 tablet (10 mg total) by mouth daily. 30 tablet 3  ? feeding supplement (ENSURE ENLIVE / ENSURE PLUS) LIQD Try to drink 1 can 1-2 times a day.    ? Fluticasone-Salmeterol (ADVAIR) 100-50 MCG/DOSE AEPB Inhale 1 puff into the lungs 2 (two) times daily. 1 each 0  ? isosorbide mononitrate (IMDUR) 30 MG 24 hr tablet TAKE 1/2 OF A TABLET (15 MG TOTAL) BY MOUTH DAILY 45 tablet 3  ? melatonin 5 MG TABS Take 1 tablet (5 mg total) by mouth at bedtime. 30 tablet 0  ? mometasone-formoterol (DULERA) 100-5 MCG/ACT AERO Inhale 2 puffs into the lungs in the morning and at bedtime. 1 each 1  ? Multiple Vitamin (MULTIVITAMIN) capsule Take 1 capsule by mouth daily.    ? nitroGLYCERIN (NITROSTAT) 0.4 MG SL tablet Place 1 tablet (0.4 mg total) under the tongue every 5 (five) minutes x 3 doses as needed for chest pain. 25 tablet 12  ? pantoprazole (PROTONIX) 40 MG tablet Take 1 tablet (40 mg total) by mouth daily. 30 tablet 1  ? potassium chloride SA (KLOR-CON) 20 MEQ tablet Take 1 tablet (20 mEq total) by mouth daily. 90 tablet 3  ?  Pseudoeph-Doxylamine-DM-APAP (NYQUIL PO) Take 1 Dose by mouth at bedtime as needed (sleep). Heart healty nyquil    ? Rivaroxaban (XARELTO) 15 MG TABS tablet Take 1 tablet (15 mg total) by mouth daily with supper. 30 tablet 2  ? sacubitril-valsartan (ENTRESTO) 97-103 MG TAKE 1 TABLET BY MOUTH TWICE A DAY 180 tablet 1  ? furosemide (LASIX) 40 MG tablet TAKE 1/2 TABLET BY MOUTH DAILY 45 tablet 3  ? ?No current facility-administered medications for this visit.  ? ? ? ?Past Medical History:  ?Diagnosis Date  ? Adrenal mass ([place])   ? per pt this is remote (10 years) and benign by biopsy  ? AICD (automatic cardioverter/defibrillator) present   ? Anxiety   ? Arthritis   ? CAD (coronary artery disease)   ? a. s/p PCI to LAD 2001 b. myoview 2014 high risk with scar LAD/RCA territory but no ischemia  ? Cardiomyopathy, ischemic   ? CHF (congestive heart failure) ([place])   ? class II/III  ? COPD (chronic obstructive pulmonary disease) ([place])   ? smokes cigars but has quit cigarettes  ? Defibrillator discharge 04/21/2019  ? Diverticulitis   ? Dyspnea   ? Flu 03/2015  ? HTN (hypertension)   ? Hyperlipidemia   ? NSVT (  nonsustained ventricular tachycardia) ([place]) 03/05/2017  ? Paroxysmal atrial fibrillation ([place]) 03/2015  ? chads2vasc score is 4  ? PSA (psoriatic arthritis) ([place])   ? increased  ? PVD (peripheral vascular disease) ([place])   ? ? ?ROS: ? ? All systems reviewed and negative except as noted in the HPI. ? ? ?Past Surgical History:  ?Procedure Laterality Date  ? ABDOMINAL AORTIC ENDOVASCULAR STENT GRAFT N/A 03/03/2017  ? Procedure: ABDOMINAL AORTIC ENDOVASCULAR STENT GRAFT;  Surgeon: Waynetta Sandy, MD;  Location: [facility];  Service: Vascular;  Laterality: N/A;  ? cataract surgery    ? CORONARY ANGIOGRAPHY N/A 10/12/2020  ? Procedure: CORONARY ANGIOGRAPHY;  Surgeon: Lorretta Harp, MD;  Location: [facility];  Service: Cardiovascular;  Laterality: N/A;  ? CORONARY ANGIOPLASTY WITH STENT PLACEMENT  2001  ? a. PCI to  LAD  ? IMPLANTABLE CARDIOVERTER DEFIBRILLATOR (ICD) GENERATOR CHANGE N/A 03/18/2014  ? a. SJM Fortify ST DR ICD implanted by [facility] for primary prevention b. gen change 03/2014   ? LEAD REVISION/REPAIR N/A 07/17/2016  ? Procedure: Lead Revision/Repair;  Surgeon: Evans Lance, MD;  Location: [facility];  Service: Cardiovascular;  Laterality: N/A;  ? LEFT HEART CATHETERIZATION WITH CORONARY ANGIOGRAM N/A 05/27/2014  ? Procedure: LEFT HEART CATHETERIZATION WITH CORONARY ANGIOGRAM;  Surgeon: Sherren Mocha, MD;  Location: [facility];  Service: Cardiovascular;  Laterality: N/A;  ? REPAIR KNEE LIGAMENT    ? V TACH ABLATION N/A 10/26/2020  ? Procedure: V TACH ABLATION;  Surgeon: Evans Lance, MD;  Location: [facility];  Service: Cardiovascular;  Laterality: N/A;  ? ? ? ?Family History  ?Problem Relation Age of Onset  ? Cirrhosis Mother   ?     due to ETOH  ? Cancer Neg Hx   ? ? ? ?Social History  ? ?Socioeconomic History  ? Marital status: Married  ?  Spouse name: Not on file  ? Number of children: Not on file  ? Years of education: Not on file  ? Highest education level: Not on file  ?Occupational History  ? Occupation: Lawn care  ?Tobacco Use  ? Smoking status: Every Day  ?  Types: Cigars  ? Smokeless tobacco: Never  ? Tobacco comments:  ?  smokes cigars now, no longer smokes cigarettes but previously smoked 2ppd  ?Vaping Use  ? Vaping Use: Never used  ?Substance and Sexual Activity  ? Alcohol use: No  ? Drug use: No  ? Sexual activity: Not Currently  ?  Partners: Female  ?  Birth control/protection: None  ?Other Topics Concern  ? Not on file  ?Social History Narrative  ? Not on file  ? ?Social Determinants of Health  ? ?Financial Resource Strain: Not on file  ?Food Insecurity: Not on file  ?Transportation Needs: Not on file  ?Physical Activity: Not on file  ?Stress: Not on file  ?Social Connections: Not on file  ?Intimate Partner Violence: Not on file  ? ? ? ?BP 122/68   Pulse 60   Ht '5\' 11"'$  (1.803 m)    Wt 150 lb 9.6 oz (68.3 kg)   SpO2 94%   BMI 21.00 kg/m?  ? ?Physical Exam: ? ?Well appearing 76yo man, NAD ?HEENT: Unremarkable ?Neck:  No JVD, no thyromegally ?Lymphatics:  No adenopathy ?Back:  No CVA tenderness ?Lungs:  Clear with no wheezes ?HEART:  Regular rate rhythm, no murmurs, no rubs, no clicks ?Abd:  soft, positive bowel sounds, no organomegally, no rebound,  no guarding ?Ext:  2 plus pulses, no edema, no cyanosis, no clubbing ?Skin:  No rashes no nodules ?Neuro:  CN II through XII intact, motor grossly intact ? ?EKG - nsr with atrial pacing and RBBB ? ?DEVICE  ?Normal device function.  See PaceArt for details.  ? ?Assess/Plan:  ?VT - he has not had any additional VT since his VT ablation. Continue low dose amio and watchful waiting. ?Chronic systolic heart failure - his symptoms are class 2. No change in his meds. ?ICM - he denies anginal symptoms. ?PAF - he is maintaining NSR. Continue xarelto. ? ?Carleene Overlie Duru Reiger,MD ?

## 2021-05-30 ENCOUNTER — Other Ambulatory Visit: Payer: Self-pay | Admitting: Student

## 2021-05-30 ENCOUNTER — Other Ambulatory Visit: Payer: Self-pay | Admitting: Physician Assistant

## 2021-05-30 NOTE — Telephone Encounter (Signed)
Xarelto '20mg'$  refill request received. Pt is 76 years old, weight-68.3kg, Crea-1.25 on 03/30/2021, last seen by Dr. Lovena Le on 05/25/2021, Diagnosis-Afib, [place].74m/min; Dose is inappropriate based on dosing criteria. On 05/01/2021 Xarelto '15mg'$  was sent as dose was changed at that time (please see that refill note).  ? ?Called the pharmacy to investigate the reason we are receiving the '20mg'$  since the dose was changed last month and she stated they were both on the profile. She will remove the '20mg'$  and confirmed she had the '15mg'$  dose with refills; pt is pending labs at that last refill to ensure correct dosing (see previous refill note) therefore, will not send in any at this time.  ?

## 2021-06-04 ENCOUNTER — Ambulatory Visit (INDEPENDENT_AMBULATORY_CARE_PROVIDER_SITE_OTHER): Payer: Medicare (Managed Care)

## 2021-06-04 DIAGNOSIS — Z95810 Presence of automatic (implantable) cardiac defibrillator: Secondary | ICD-10-CM

## 2021-06-04 DIAGNOSIS — I5022 Chronic systolic (congestive) heart failure: Secondary | ICD-10-CM | POA: Diagnosis not present

## 2021-06-12 NOTE — Progress Notes (Signed)
EPIC Encounter for ICM Monitoring ? ?Patient Name: Benjamin Cook is a 76 y.o. male ?Date: 06/12/2021 ?Primary Care Physican: Roselee Nova, MD ?Primary Cardiologist: [name] ?Electrophysiologist: Lovena Le ?01/24/2021 Office Weight: 146 lbs ?  ?  ?Transmission reviewed.  ?  ?CorVue thoracic impedance suggesting normal fluid levels but has only recorded since 5/2 in last month.   Unable to follow fluid levels due to lack of recordings.  ?   ?Prescribed:  ?Furosemide 40 mg 0.5 tablet (20 mg total) daily.   ?Potassium 20 mEq take 1 tablet daily. ?  ?Labs: ?03/30/2021 Creatinine 1.25, BUN 12, Potassium 3.9, Sodium 138, GFR 59 ?A complete set of results can be found in Results Review. ?  ?Recommendations:  No changes. ?  ?Follow-up plan: No further ICM clinic phone appointments due inconsistency in thoracic impedance recordings.   91 day device clinic remote transmission 07/20/2021.   ?  ?EP/Cardiology Office Visits:  Recall 09/13/2020 with Dr. [name].   ?  ?Copy of ICM check sent to Dr. Lovena Le.  ? ?3 month ICM trend: 06/11/2021. ? ? ? ?12-14 Month ICM trend:  ? ? ? ?[name], RN ?06/12/2021 ?8:19 AM ? ?

## 2021-06-20 ENCOUNTER — Other Ambulatory Visit: Payer: Medicare (Managed Care) | Admitting: Nurse Practitioner

## 2021-06-25 ENCOUNTER — Telehealth: Payer: Self-pay | Admitting: Cardiology

## 2021-06-25 MED ORDER — RIVAROXABAN 15 MG PO TABS: 15.0000 mg | ORAL_TABLET | Freq: Every day | ORAL | 0 refills | Status: DC

## 2021-06-25 NOTE — Telephone Encounter (Signed)
Pt c/o medication issue:  1. Name of Medication: Rivaroxaban (XARELTO) 15 MG TABS tablet  2. How are you currently taking this medication (dosage and times per day)? As prescribed   3. Are you having a reaction (difficulty breathing--STAT)? No  4. What is your medication issue? Pt's pharmacy states that medication shows the 20 mg, but it is calling for 15 mg and they need this corrected.   [place]  Fax # [phone]

## 2021-06-25 NOTE — Telephone Encounter (Signed)
Will send in enough until he has his recheck in June

## 2021-07-10 ENCOUNTER — Other Ambulatory Visit: Payer: Self-pay | Admitting: Internal Medicine

## 2021-07-10 NOTE — Telephone Encounter (Signed)
Prescription refill request for Xarelto received.  Indication: PAF Last office visit:  05/25/21  Beckie Salts MD Weight: 68.3kg Age: 76 Scr: 1.25 on 03/30/21 CrCl: 47.81  Based on above findings Xarelto '15mg'$  daily is the appropriate dose.  Refill approved.

## 2021-07-16 ENCOUNTER — Other Ambulatory Visit: Payer: Medicare (Managed Care)

## 2021-07-20 ENCOUNTER — Other Ambulatory Visit: Payer: Medicare (Managed Care) | Admitting: *Deleted

## 2021-07-20 DIAGNOSIS — I480 Paroxysmal atrial fibrillation: Secondary | ICD-10-CM

## 2021-07-21 LAB — BASIC METABOLIC PANEL
BUN/Creatinine Ratio: 9 — ABNORMAL LOW (ref 10–24)
BUN: 11 mg/dL (ref 8–27)
CO2: 23 mmol/L (ref 20–29)
Calcium: 9.1 mg/dL (ref 8.6–10.2)
Chloride: 104 mmol/L (ref 96–106)
Creatinine, Ser: 1.23 mg/dL (ref 0.76–1.27)
Glucose: 113 mg/dL — ABNORMAL HIGH (ref 70–99)
Potassium: 4.1 mmol/L (ref 3.5–5.2)
Sodium: 138 mmol/L (ref 134–144)
eGFR: 60 mL/min/{1.73_m2} (ref >59)

## 2021-07-21 LAB — CBC
Hematocrit: 35.4 % — ABNORMAL LOW (ref 37.5–51.0)
Hemoglobin: 11.2 g/dL — ABNORMAL LOW (ref 13.0–17.7)
MCH: 26.5 pg — ABNORMAL LOW (ref 26.6–33.0)
MCHC: 31.6 g/dL (ref 31.5–35.7)
MCV: 84 fL (ref 79–97)
Platelets: 295 10*3/uL (ref 150–450)
RBC: 4.23 x10E6/uL (ref 4.14–5.80)
RDW: 16.6 % — ABNORMAL HIGH (ref 11.6–15.4)
WBC: 6.8 10*3/uL (ref 3.4–10.8)

## 2021-07-25 ENCOUNTER — Ambulatory Visit (INDEPENDENT_AMBULATORY_CARE_PROVIDER_SITE_OTHER): Payer: Medicare (Managed Care)

## 2021-07-25 DIAGNOSIS — I255 Ischemic cardiomyopathy: Secondary | ICD-10-CM | POA: Diagnosis not present

## 2021-07-26 LAB — CUP PACEART REMOTE DEVICE CHECK
Battery Remaining Longevity: 27 mo
Battery Remaining Percentage: 28 %
Battery Voltage: 2.84 V
Brady Statistic AP VP Percent: 3.2 %
Brady Statistic AP VS Percent: 28 %
Brady Statistic AS VP Percent: 6.5 %
Brady Statistic AS VS Percent: 62 %
Brady Statistic RA Percent Paced: 29 %
Brady Statistic RV Percent Paced: 9.7 %
Date Time Interrogation Session: 20230621073155
HighPow Impedance: 64 Ohm
HighPow Impedance: 64 Ohm
Implantable Lead Implant Date: 20120420
Implantable Lead Implant Date: 20180613
Implantable Lead Location: 753859
Implantable Lead Location: 753860
Implantable Pulse Generator Implant Date: 20160212
Lead Channel Impedance Value: 330 Ohm
Lead Channel Impedance Value: 530 Ohm
Lead Channel Pacing Threshold Amplitude: 0.5 V
Lead Channel Pacing Threshold Amplitude: 0.75 V
Lead Channel Pacing Threshold Pulse Width: 0.5 ms
Lead Channel Pacing Threshold Pulse Width: 0.5 ms
Lead Channel Sensing Intrinsic Amplitude: 1.7 mV
Lead Channel Sensing Intrinsic Amplitude: 11.6 mV
Lead Channel Setting Pacing Amplitude: 2 V
Lead Channel Setting Pacing Amplitude: 2.5 V
Lead Channel Setting Pacing Pulse Width: 0.5 ms
Lead Channel Setting Sensing Sensitivity: 0.5 mV
Pulse Gen Serial Number: 7225079

## 2021-08-08 NOTE — Progress Notes (Signed)
Remote ICD transmission.   

## 2021-08-22 ENCOUNTER — Other Ambulatory Visit: Payer: Self-pay | Admitting: Student

## 2021-08-22 ENCOUNTER — Other Ambulatory Visit: Payer: Self-pay | Admitting: Physician Assistant

## 2021-08-22 DIAGNOSIS — I5022 Chronic systolic (congestive) heart failure: Secondary | ICD-10-CM

## 2021-08-29 ENCOUNTER — Other Ambulatory Visit: Payer: Medicare (Managed Care) | Admitting: Nurse Practitioner

## 2021-08-29 ENCOUNTER — Telehealth: Payer: Self-pay | Admitting: Nurse Practitioner

## 2021-08-29 NOTE — Telephone Encounter (Signed)
I attempted to contact Benjamin Cook to confirm pc f/u visit, no answer, unable to leave a message, will continue to try to contact

## 2021-10-13 ENCOUNTER — Emergency Department (HOSPITAL_COMMUNITY): Payer: Medicare (Managed Care)

## 2021-10-13 ENCOUNTER — Observation Stay (HOSPITAL_COMMUNITY)
Admission: EM | Admit: 2021-10-13 | Discharge: 2021-10-14 | Disposition: A | Discharge status: Home / Self Care | Payer: Medicare (Managed Care) | Attending: Family Medicine | Admitting: Family Medicine

## 2021-10-13 ENCOUNTER — Encounter (HOSPITAL_COMMUNITY): Payer: Self-pay

## 2021-10-13 ENCOUNTER — Ambulatory Visit (HOSPITAL_COMMUNITY)
Admission: RE | Admit: 2021-10-13 | Discharge: 2021-10-13 | Disposition: A | Discharge status: Other Acute Inpatient Hospital | Payer: Medicare (Managed Care) | Source: Ambulatory Visit

## 2021-10-13 ENCOUNTER — Ambulatory Visit (INDEPENDENT_AMBULATORY_CARE_PROVIDER_SITE_OTHER): Payer: Medicare (Managed Care)

## 2021-10-13 VITALS — BP 133/74 | HR 73 | Temp 97.7°F | Resp 18

## 2021-10-13 DIAGNOSIS — R7989 Other specified abnormal findings of blood chemistry: Secondary | ICD-10-CM | POA: Insufficient documentation

## 2021-10-13 DIAGNOSIS — Z7984 Long term (current) use of oral hypoglycemic drugs: Secondary | ICD-10-CM | POA: Diagnosis not present

## 2021-10-13 DIAGNOSIS — R55 Syncope and collapse: Secondary | ICD-10-CM | POA: Diagnosis present

## 2021-10-13 DIAGNOSIS — J189 Pneumonia, unspecified organism: Secondary | ICD-10-CM

## 2021-10-13 DIAGNOSIS — J1282 Pneumonia due to coronavirus disease 2019: Secondary | ICD-10-CM | POA: Insufficient documentation

## 2021-10-13 DIAGNOSIS — R7401 Elevation of levels of liver transaminase levels: Secondary | ICD-10-CM | POA: Insufficient documentation

## 2021-10-13 DIAGNOSIS — I5042 Chronic combined systolic (congestive) and diastolic (congestive) heart failure: Secondary | ICD-10-CM | POA: Diagnosis not present

## 2021-10-13 DIAGNOSIS — R911 Solitary pulmonary nodule: Secondary | ICD-10-CM | POA: Insufficient documentation

## 2021-10-13 DIAGNOSIS — U071 COVID-19: Secondary | ICD-10-CM | POA: Diagnosis not present

## 2021-10-13 DIAGNOSIS — R42 Dizziness and giddiness: Secondary | ICD-10-CM | POA: Insufficient documentation

## 2021-10-13 DIAGNOSIS — R059 Cough, unspecified: Secondary | ICD-10-CM | POA: Diagnosis not present

## 2021-10-13 DIAGNOSIS — I110 Hypertensive heart disease with heart failure: Secondary | ICD-10-CM | POA: Insufficient documentation

## 2021-10-13 DIAGNOSIS — Z95810 Presence of automatic (implantable) cardiac defibrillator: Secondary | ICD-10-CM | POA: Insufficient documentation

## 2021-10-13 DIAGNOSIS — Z7901 Long term (current) use of anticoagulants: Secondary | ICD-10-CM | POA: Diagnosis not present

## 2021-10-13 DIAGNOSIS — Z955 Presence of coronary angioplasty implant and graft: Secondary | ICD-10-CM | POA: Insufficient documentation

## 2021-10-13 DIAGNOSIS — I480 Paroxysmal atrial fibrillation: Secondary | ICD-10-CM | POA: Insufficient documentation

## 2021-10-13 DIAGNOSIS — J440 Chronic obstructive pulmonary disease with acute lower respiratory infection: Secondary | ICD-10-CM | POA: Diagnosis not present

## 2021-10-13 DIAGNOSIS — D638 Anemia in other chronic diseases classified elsewhere: Secondary | ICD-10-CM | POA: Diagnosis not present

## 2021-10-13 DIAGNOSIS — Z79899 Other long term (current) drug therapy: Secondary | ICD-10-CM | POA: Insufficient documentation

## 2021-10-13 DIAGNOSIS — R918 Other nonspecific abnormal finding of lung field: Secondary | ICD-10-CM | POA: Diagnosis present

## 2021-10-13 DIAGNOSIS — Z952 Presence of prosthetic heart valve: Secondary | ICD-10-CM | POA: Diagnosis not present

## 2021-10-13 DIAGNOSIS — E785 Hyperlipidemia, unspecified: Secondary | ICD-10-CM

## 2021-10-13 DIAGNOSIS — J449 Chronic obstructive pulmonary disease, unspecified: Secondary | ICD-10-CM | POA: Diagnosis not present

## 2021-10-13 DIAGNOSIS — F17290 Nicotine dependence, other tobacco product, uncomplicated: Secondary | ICD-10-CM | POA: Insufficient documentation

## 2021-10-13 DIAGNOSIS — R509 Fever, unspecified: Secondary | ICD-10-CM

## 2021-10-13 DIAGNOSIS — I5020 Unspecified systolic (congestive) heart failure: Secondary | ICD-10-CM | POA: Insufficient documentation

## 2021-10-13 DIAGNOSIS — I2510 Atherosclerotic heart disease of native coronary artery without angina pectoris: Secondary | ICD-10-CM | POA: Diagnosis not present

## 2021-10-13 DIAGNOSIS — R531 Weakness: Principal | ICD-10-CM

## 2021-10-13 DIAGNOSIS — J188 Other pneumonia, unspecified organism: Secondary | ICD-10-CM

## 2021-10-13 DIAGNOSIS — D649 Anemia, unspecified: Secondary | ICD-10-CM | POA: Diagnosis present

## 2021-10-13 DIAGNOSIS — C3490 Malignant neoplasm of unspecified part of unspecified bronchus or lung: Secondary | ICD-10-CM | POA: Diagnosis present

## 2021-10-13 LAB — URINALYSIS, ROUTINE W REFLEX MICROSCOPIC
Bilirubin Urine: NEGATIVE
Glucose, UA: NEGATIVE mg/dL
Hgb urine dipstick: NEGATIVE
Ketones, ur: NEGATIVE mg/dL
Leukocytes,Ua: NEGATIVE
Nitrite: NEGATIVE
Protein, ur: NEGATIVE mg/dL
Specific Gravity, Urine: 1.016 (ref 1.005–1.030)
pH: 5 (ref 5.0–8.0)

## 2021-10-13 LAB — CBC WITH DIFFERENTIAL/PLATELET
Abs Immature Granulocytes: 0.03 10*3/uL (ref 0.00–0.07)
Basophils Absolute: 0 10*3/uL (ref 0.0–0.1)
Basophils Relative: 0 %
Eosinophils Absolute: 0.1 10*3/uL (ref 0.0–0.5)
Eosinophils Relative: 1 %
HCT: 33.7 % — ABNORMAL LOW (ref 39.0–52.0)
Hemoglobin: 11 g/dL — ABNORMAL LOW (ref 13.0–17.0)
Immature Granulocytes: 0 %
Lymphocytes Relative: 10 %
Lymphs Abs: 0.9 10*3/uL (ref 0.7–4.0)
MCH: 26.1 pg (ref 26.0–34.0)
MCHC: 32.6 g/dL (ref 30.0–36.0)
MCV: 79.9 fL — ABNORMAL LOW (ref 80.0–100.0)
Monocytes Absolute: 1.2 10*3/uL — ABNORMAL HIGH (ref 0.1–1.0)
Monocytes Relative: 14 %
Neutro Abs: 6.5 10*3/uL (ref 1.7–7.7)
Neutrophils Relative %: 75 %
Platelets: 286 10*3/uL (ref 150–400)
RBC: 4.22 MIL/uL (ref 4.22–5.81)
RDW: 17.2 % — ABNORMAL HIGH (ref 11.5–15.5)
WBC: 8.7 10*3/uL (ref 4.0–10.5)
nRBC: 0 % (ref 0.0–0.2)

## 2021-10-13 LAB — RESP PANEL BY RT-PCR (RSV, FLU A&B, COVID)  RVPGX2
Influenza A by PCR: NEGATIVE
Influenza B by PCR: NEGATIVE
Resp Syncytial Virus by PCR: NEGATIVE
SARS Coronavirus 2 by RT PCR: POSITIVE — AB

## 2021-10-13 LAB — COMPREHENSIVE METABOLIC PANEL
ALT: 67 U/L — ABNORMAL HIGH (ref 0–44)
AST: 73 U/L — ABNORMAL HIGH (ref 15–41)
Albumin: 3.2 g/dL — ABNORMAL LOW (ref 3.5–5.0)
Alkaline Phosphatase: 52 U/L (ref 38–126)
Anion gap: 10 (ref 5–15)
BUN: 9 mg/dL (ref 8–23)
CO2: 28 mmol/L (ref 22–32)
Calcium: 8.6 mg/dL — ABNORMAL LOW (ref 8.9–10.3)
Chloride: 101 mmol/L (ref 98–111)
Creatinine, Ser: 1.16 mg/dL (ref 0.61–1.24)
GFR, Estimated: >60 mL/min (ref >60)
Glucose, Bld: 118 mg/dL — ABNORMAL HIGH (ref 70–99)
Potassium: 4.2 mmol/L (ref 3.5–5.1)
Sodium: 139 mmol/L (ref 135–145)
Total Bilirubin: 0.6 mg/dL (ref 0.3–1.2)
Total Protein: 7 g/dL (ref 6.5–8.1)

## 2021-10-13 LAB — TROPONIN I (HIGH SENSITIVITY)
Troponin I (High Sensitivity): 25 ng/L — ABNORMAL HIGH (ref <18)
Troponin I (High Sensitivity): 26 ng/L — ABNORMAL HIGH (ref <18)

## 2021-10-13 LAB — BRAIN NATRIURETIC PEPTIDE: B Natriuretic Peptide: 159.5 pg/mL — ABNORMAL HIGH (ref 0.0–100.0)

## 2021-10-13 LAB — RESP PANEL BY RT-PCR (FLU A&B, COVID) ARPGX2
Influenza A by PCR: NEGATIVE
Influenza B by PCR: NEGATIVE
SARS Coronavirus 2 by RT PCR: POSITIVE — AB

## 2021-10-13 MED ORDER — ACETAMINOPHEN 650 MG RE SUPP: 650.0000 mg | Freq: Four times a day (QID) | RECTAL | Status: DC | PRN

## 2021-10-13 MED ORDER — METHYLPREDNISOLONE SODIUM SUCC 125 MG IJ SOLR
125.0000 mg | Freq: Once | INTRAMUSCULAR | Status: AC
  Administered 2021-10-13: 125 mg via INTRAVENOUS
  Filled 2021-10-13: qty 2

## 2021-10-13 MED ORDER — IOHEXOL 350 MG/ML SOLN
80.0000 mL | Freq: Once | INTRAVENOUS | Status: AC | PRN
  Administered 2021-10-13: 80 mL via INTRAVENOUS

## 2021-10-13 MED ORDER — MOLNUPIRAVIR EUA 200MG CAPSULE
4.0000 | ORAL_CAPSULE | Freq: Two times a day (BID) | ORAL | Status: DC
  Administered 2021-10-14: 800 mg via ORAL
  Filled 2021-10-13 (×2): qty 4

## 2021-10-13 MED ORDER — SODIUM CHLORIDE 0.9 % IV SOLN
500.0000 mg | Freq: Once | INTRAVENOUS | Status: AC
  Administered 2021-10-13: 500 mg via INTRAVENOUS
  Filled 2021-10-13: qty 5

## 2021-10-13 MED ORDER — IPRATROPIUM-ALBUTEROL 0.5-2.5 (3) MG/3ML IN SOLN
3.0000 mL | Freq: Once | RESPIRATORY_TRACT | Status: AC
  Administered 2021-10-13: 3 mL via RESPIRATORY_TRACT
  Filled 2021-10-13: qty 3

## 2021-10-13 MED ORDER — ALBUTEROL SULFATE HFA 108 (90 BASE) MCG/ACT IN AERS: 1.0000 | INHALATION_SPRAY | RESPIRATORY_TRACT | Status: DC | PRN

## 2021-10-13 MED ORDER — SODIUM CHLORIDE 0.9 % IV SOLN
2.0000 g | Freq: Once | INTRAVENOUS | Status: AC
  Administered 2021-10-13: 2 g via INTRAVENOUS
  Filled 2021-10-13: qty 20

## 2021-10-13 MED ORDER — ACETAMINOPHEN 325 MG PO TABS: 650.0000 mg | ORAL_TABLET | Freq: Four times a day (QID) | ORAL | Status: DC | PRN

## 2021-10-13 NOTE — ED Notes (Signed)
Attempt to draw blood and do covid swab. Pt became very anxious,diaphoretic states he was SOB, O2 Sat 100%,Erin Raspet PA aware. Pt offered ambulance to ED, pt agreed but refused to have anything else done. [facility] called for transport.

## 2021-10-13 NOTE — H&P (Signed)
History and Physical    PLEASE NOTE THAT DRAGON DICTATION SOFTWARE WAS USED IN THE CONSTRUCTION OF THIS NOTE.   Benjamin Cook MVE:[id number] DOB: May 30, 1945 DOA: 10/13/2021  PCP: Roselee Nova, MD *** Patient coming from: home ***  I have personally briefly reviewed patient's old medical records in [place]  Chief Complaint: ***  HPI: Benjamin Cook is a 76 y.o. male with medical history significant for *** who is admitted to [facility] on 10/13/2021 with *** after presenting from home*** to [facility] ED complaining of ***.    ***    ***SOB: Denies any associated orthopnea, PND, or new onset peripheral edema. No recent chest pain, diaphoresis, palpitations, N/V, pre-syncope, or syncope. Not associated with any recent cough, wheezing, hemoptysis, new lower extremity erythema, or calf tenderness. Denies any recent trauma, travel, surgical procedures, or periods of prolonged diminished ambulatory status. No recent melena or hematochezia.   Denies any associated subjective fever, chills, rigors, or generalized myalgias. No recent headache, neck stiffness, rhinitis, rhinorrhea, sore throat, abdominal pain, diarrhea, or rash. No known recent COVID-19 exposures. Denies dysuria, gross hematuria, or change in urinary urgency/frequency.  ***   ***misc/infectious: Denies any subjective fever, chills, rigors, or generalized myalgias. Denies any recent headache, neck stiffness, rhinitis, rhinorrhea, sore throat, sob, wheezing, cough, nausea, vomiting, abdominal pain, diarrhea, or rash. No recent traveling or known COVID-19 exposures. Denies dysuria, gross hematuria, or change in urinary urgency/frequency.  Denies any recent chest pain, diaphoresis, or palpitations. ***    ED Course:  Vital signs in the ED were notable for the following: ***  Labs were notable for the following: ***  Imaging and additional notable ED work-up: ***  While in the ED, the following were  administered: ***  Subsequently, the patient was admitted  ***  ***red    Review of Systems: As per HPI otherwise 10 point review of systems negative.   Past Medical History:  Diagnosis Date   Adrenal mass ([place])    per pt this is remote (10 years) and benign by biopsy   AICD (automatic cardioverter/defibrillator) present    Anxiety    Arthritis    CAD (coronary artery disease)    a. s/p PCI to LAD 2001 b. myoview 2014 high risk with scar LAD/RCA territory but no ischemia   Cardiomyopathy, ischemic    CHF (congestive heart failure) ([name])    class II/III   COPD (chronic obstructive pulmonary disease) ([place])    smokes cigars but has quit cigarettes   Defibrillator discharge 04/21/2019   Diverticulitis    Dyspnea    Flu 03/2015   HTN (hypertension)    Hyperlipidemia    NSVT (nonsustained ventricular tachycardia) ([place]) 03/05/2017   Paroxysmal atrial fibrillation ([place]) 03/2015   chads2vasc score is 4   PSA (psoriatic arthritis) ([name])    increased   PVD (peripheral vascular disease) ([place])     Past Surgical History:  Procedure Laterality Date   ABDOMINAL AORTIC ENDOVASCULAR STENT GRAFT N/A 03/03/2017   Procedure: ABDOMINAL AORTIC ENDOVASCULAR STENT GRAFT;  Surgeon: Waynetta Sandy, MD;  Location: [place];  Service: Vascular;  Laterality: N/A;   cataract surgery     CORONARY ANGIOGRAPHY N/A 10/12/2020   Procedure: CORONARY ANGIOGRAPHY;  Surgeon: Lorretta Harp, MD;  Location: [place] CV LAB;  Service: Cardiovascular;  Laterality: N/A;   CORONARY ANGIOPLASTY WITH STENT PLACEMENT  2001   a. PCI to LAD   IMPLANTABLE CARDIOVERTER DEFIBRILLATOR (ICD) GENERATOR CHANGE N/A  03/18/2014   a. SJM Fortify ST DR ICD implanted by [facility] for primary prevention b. gen change 03/2014    LEAD REVISION/REPAIR N/A 07/17/2016   Procedure: Lead Revision/Repair;  Surgeon: Evans Lance, MD;  Location: [place] [facility];  Service: Cardiovascular;  Laterality: N/A;   LEFT HEART CATHETERIZATION  WITH CORONARY ANGIOGRAM N/A 05/27/2014   Procedure: LEFT HEART CATHETERIZATION WITH CORONARY ANGIOGRAM;  Surgeon: Sherren Mocha, MD;  Location: [facility] CATH LAB;  Service: Cardiovascular;  Laterality: N/A;   REPAIR KNEE LIGAMENT     V TACH ABLATION N/A 10/26/2020   Procedure: V TACH ABLATION;  Surgeon: Evans Lance, MD;  Location: [place] CV LAB;  Service: Cardiovascular;  Laterality: N/A;    Social History:  reports that he has been smoking cigars. He has never used smokeless tobacco. He reports that he does not drink alcohol and does not use drugs.   No Known Allergies  Family History  Problem Relation Age of Onset   Cirrhosis Mother        due to ETOH   Cancer Neg Hx     Family history reviewed and not pertinent ***   Prior to Admission medications   Medication Sig Start Date End Date Taking? Authorizing Provider  acetaminophen (TYLENOL) 500 MG tablet Take 500-1,000 mg by mouth every 8 (eight) hours as needed (for pain).     [provider]  amiodarone (PACERONE) 100 MG tablet Take 1 tablet (100 mg total) by mouth daily. 01/24/21   Shirley Friar, PA-C  atorvastatin (LIPITOR) 80 MG tablet TAKE 1 TABLET BY MOUTH DAILY AT 6 PM 04/30/21   Lelon Perla, MD  carvedilol (COREG) 6.25 MG tablet TAKE 1 TABLET BY MOUTH TWICE A DAY WITH MEALS 05/01/21   Shirley Friar, PA-C  empagliflozin (JARDIANCE) 10 MG TABS tablet Take 1 tablet (10 mg total) by mouth daily. 10/17/20   Charlynne Cousins, MD  feeding supplement (ENSURE ENLIVE / ENSURE PLUS) LIQD Try to drink 1 can 1-2 times a day. 06/04/20   Richardson Dopp T, PA-C  Fluticasone-Salmeterol (ADVAIR) 100-50 MCG/DOSE AEPB Inhale 1 puff into the lungs 2 (two) times daily. 01/07/20   Shirley Friar, PA-C  furosemide (LASIX) 40 MG tablet TAKE 1/2 TABLET BY MOUTH DAILY 05/25/21   Evans Lance, MD  isosorbide mononitrate (IMDUR) 30 MG 24 hr tablet TAKE 1/2 OF A TABLET (15 MG TOTAL) BY MOUTH DAILY 11/24/20    Allred, Jeneen Rinks, MD  melatonin 5 MG TABS Take 1 tablet (5 mg total) by mouth at bedtime. 10/16/20   Charlynne Cousins, MD  mometasone-formoterol (DULERA) 100-5 MCG/ACT AERO Inhale 2 puffs into the lungs in the morning and at bedtime. 07/12/20   Allred, Jeneen Rinks, MD  Multiple Vitamin (MULTIVITAMIN) capsule Take 1 capsule by mouth daily.    [provider]  nitroGLYCERIN (NITROSTAT) 0.4 MG SL tablet Place 1 tablet (0.4 mg total) under the tongue every 5 (five) minutes x 3 doses as needed for chest pain. 06/04/20 06/04/21  Richardson Dopp T, PA-C  pantoprazole (PROTONIX) 40 MG tablet Take 1 tablet (40 mg total) by mouth daily. 10/16/20 10/16/21  Charlynne Cousins, MD  potassium chloride SA (KLOR-CON) 20 MEQ tablet Take 1 tablet (20 mEq total) by mouth daily. 12/06/20   Allred, Jeneen Rinks, MD  Pseudoeph-Doxylamine-DM-APAP (NYQUIL PO) Take 1 Dose by mouth at bedtime as needed (sleep). Heart healty nyquil    [provider]  Rivaroxaban (XARELTO) 15 MG TABS tablet TAKE  1 TABLET BY MOUTH EVERY DAY WITH SUPPER 07/10/21   Lelon Perla, MD  sacubitril-valsartan (ENTRESTO) 97-103 MG TAKE 1 TABLET BY MOUTH TWICE A DAY 04/11/21   Lelon Perla, MD     Objective    Physical Exam: Vitals:   10/13/21 1505 10/13/21 1954  BP: 128/73 121/67  Pulse: 68 74  Resp: 18 14  Temp: 97.6 F (36.4 C) 98.2 F (36.8 C)  TempSrc: Oral Oral  SpO2: 98% 96%    General: appears to be stated age; alert, oriented Skin: warm, dry, no rash Head:  AT/ Mouth:  Oral mucosa membranes appear moist, normal dentition Neck: supple; trachea midline Heart:  RRR; did not appreciate any M/R/G Lungs: CTAB, did not appreciate any wheezes, rales, or rhonchi Abdomen: + BS; soft, ND, NT Vascular: 2+ pedal pulses b/l; 2+ radial pulses b/l Extremities: no peripheral edema, no muscle wasting Neuro: strength and sensation intact in upper and lower extremities b/l ***   *** Neuro: 5/5 strength of the proximal and distal  flexors and extensors of the upper and lower extremities bilaterally; sensation intact in upper and lower extremities b/l; cranial nerves II through XII grossly intact; no pronator drift; no evidence suggestive of slurred speech, dysarthria, or facial droop; Normal muscle tone. No tremors.  *** Neuro: In the setting of the patient's current mental status and associated inability to follow instructions, unable to perform full neurologic exam at this time.  As such, assessment of strength, sensation, and cranial nerves is limited at this time. Patient noted to spontaneously move all 4 extremities. No tremors.  ***    Labs on Admission: I have personally reviewed following labs and imaging studies  CBC: Recent Labs  Lab 10/13/21 1819  WBC 8.7  NEUTROABS 6.5  HGB 11.0*  HCT 33.7*  MCV 79.9*  PLT 417   Basic Metabolic Panel: Recent Labs  Lab 10/13/21 1819  NA 139  K 4.2  CL 101  CO2 28  GLUCOSE 118*  BUN 9  CREATININE 1.16  CALCIUM 8.6*   GFR: CrCl cannot be calculated (Unknown ideal weight.). Liver Function Tests: Recent Labs  Lab 10/13/21 1819  AST 73*  ALT 67*  ALKPHOS 52  BILITOT 0.6  PROT 7.0  ALBUMIN 3.2*   No results for input(s): "LIPASE", "AMYLASE" in the last 168 hours. No results for input(s): "AMMONIA" in the last 168 hours. Coagulation Profile: No results for input(s): "INR", "PROTIME" in the last 168 hours. Cardiac Enzymes: No results for input(s): "CKTOTAL", "CKMB", "CKMBINDEX", "TROPONINI" in the last 168 hours. BNP (last 3 results) No results for input(s): "PROBNP" in the last 8760 hours. HbA1C: No results for input(s): "HGBA1C" in the last 72 hours. CBG: No results for input(s): "GLUCAP" in the last 168 hours. Lipid Profile: No results for input(s): "CHOL", "HDL", "LDLCALC", "TRIG", "CHOLHDL", "LDLDIRECT" in the last 72 hours. Thyroid Function Tests: No results for input(s): "TSH", "T4TOTAL", "FREET4", "T3FREE", "THYROIDAB" in the last 72  hours. Anemia Panel: No results for input(s): "VITAMINB12", "FOLATE", "FERRITIN", "TIBC", "IRON", "RETICCTPCT" in the last 72 hours. Urine analysis:    Component Value Date/Time   COLORURINE YELLOW 10/13/2021 2040   APPEARANCEUR HAZY (A) 10/13/2021 2040   LABSPEC 1.016 10/13/2021 2040   PHURINE 5.0 10/13/2021 2040   GLUCOSEU NEGATIVE 10/13/2021 2040   GLUCOSEU NEGATIVE 12/09/2016 1100   HGBUR NEGATIVE 10/13/2021 2040   BILIRUBINUR NEGATIVE 10/13/2021 2040   KETONESUR NEGATIVE 10/13/2021 2040   PROTEINUR NEGATIVE 10/13/2021 2040   UROBILINOGEN 0.2  12/09/2016 1100   NITRITE NEGATIVE 10/13/2021 2040   LEUKOCYTESUR NEGATIVE 10/13/2021 2040    Radiological Exams on Admission: CT Angio Chest PE W and/or Wo Contrast  Result Date: 10/13/2021 CLINICAL DATA:  High probability for pulmonary embolism.  Fever. EXAM: CT ANGIOGRAPHY CHEST WITH CONTRAST TECHNIQUE: Multidetector CT imaging of the chest was performed using the standard protocol during bolus administration of intravenous contrast. Multiplanar CT image reconstructions and MIPs were obtained to evaluate the vascular anatomy. RADIATION DOSE REDUCTION: This exam was performed according to the departmental dose-optimization program which includes automated exposure control, adjustment of the mA and/or kV according to patient size and/or use of iterative reconstruction technique. CONTRAST:  26m OMNIPAQUE IOHEXOL 350 MG/ML SOLN COMPARISON:  CT angiogram chest 06/03/2007 FINDINGS: Cardiovascular: There is adequate opacification of the pulmonary arteries to the segmental level. There is no evidence for pulmonary embolism. The heart is moderately enlarged. There is no pericardial effusion. Left-sided pacemaker is present. Aorta is normal in size. There are atherosclerotic calcifications of the aorta and coronary arteries. Mediastinum/Nodes: There is a small hiatal hernia. Esophagus is within normal limits. Visualized thyroid gland is within normal  limits. No enlarged lymph nodes are seen. Lungs/Pleura: Moderate emphysematous changes have progressed. There is mucous plugging and bronchial wall thickening throughout the left lower lobe. There is a new nodular density abutting the right hilum in the posterior right upper lobe image 6/77 measuring 1.8 x 1.9 by 2.0 cm. There is a new band of likely scarring in the right upper lobe. No pleural effusion or pneumothorax. Scarring in both lung apices persists. Upper Abdomen: Right adrenal mass containing some punctate calcifications measuring 4.2 by 2.6 cm appears unchanged from 2009 and is favored as benign given stability. Right renal cyst is partially visualized measuring 16 mm. Musculoskeletal: No chest wall abnormality. No acute or significant osseous findings. Review of the MIP images confirms the above findings. IMPRESSION: 1. No evidence for pulmonary embolism. 2. New 20 mm right solid pulmonary nodule within the upper lobe. Per Fleischner Society Guidelines, consider a non-contrast Chest CT at 3 months, a PET/CT, or tissue sampling. These guidelines do not apply to immunocompromised patients and patients with cancer. Follow up in patients with significant comorbidities as clinically warranted. For lung cancer screening, adhere to Lung-RADS guidelines. Reference: Radiology. 2017; 284(1):228-43. 3. Left lower lobe peribronchial wall thickening and mucous plugging may related to infection/aspiration. 4. 1.6 cm right Bosniak 1 benign simple cyst. No follow-up imaging is recommended. JACR 2018 Feb; 264-273, Management of the Incidental RenalMass on CT, [employer] 2021; 814-848, Bosniak Classification of Cystic Renal Masses, Version 2019. 5. Cardiomegaly. Aortic Atherosclerosis (ICD10-I70.0) and Emphysema (ICD10-J43.9). Electronically Signed   By: ARonney AstersM.D.   On: 10/13/2021 21:21   DG Chest 2 View  Result Date: 10/13/2021 CLINICAL DATA:  Cough, fever EXAM: CHEST - 2 VIEW COMPARISON:  10/25/2020  FINDINGS: Cardiac size is within normal limits. Low position of diaphragms suggests COPD. There are new patchy interstitial markings in right upper and left lower lung fields. There are no signs of alveolar pulmonary edema. Pacemaker/defibrillator battery is seen in left infraclavicular region. There is no pleural effusion or pneumothorax. Asymmetric pleural density in the left apex has not changed. IMPRESSION: New small patchy interstitial infiltrates are seen in right upper and left lower lung fields suggesting multifocal atelectasis/pneumonia. Part of this finding may suggest underlying scarring. Electronically Signed   By: PElmer PickerM.D.   On: 10/13/2021 14:09     EKG: Independently  reviewed, with result as described above. ***   Assessment/Plan   Principal Problem:   Generalized weakness   ***       ***   ***INCIDENTAL COVID; SYMPTOMS, LESS THAN 5 DAYS DURATION #) Incidental positive COVID-19 result: COVID-19 PCR performed in the ED today found to be positive, which appears to meet criteria for incidental finding, as COVID-19 is not the primary reason for admission.  However, in the context of increased risk for progression of COVID-19 infection in the setting of patient's age and multiple comorbidities, including  ***, criteria appear to be met in the setting of this incidental COVID-19 finding with symptoms of  *** starting less than than 5 days ago, for initiation of Paxlovid.  Not on any DOAC's, although use of statin is noted. will require holding of the statin over the course of paxlovid treatment.  Does not meet criteria for sepsis.  No evidence of acute hypoxia to warrant initiation of systemic corticosteroids.   Plan: Start paxlovid , as above.  prn albuterol.  Check/trend CRP.  Repeat CBC/CMP in the AM.  Airborne precautions.  Flutter valve/incentive spirometry. prn supplemental O2 to maintain O2 sats greater than or equal to 94%. PRN acetaminophen for fever. Check  serum mag and phos levels. Check procalcitonin, which, if non-elevated in the context of the pro inflammatory state associated with COVID-19, would provide a high degree of negative predictive value that would further decrease the likelihood of any contribution from bacterial pneumonia.    *** molnupiravir *** In setting of DM2, will start linagliptin 5 mg PO Qdaily for associated mortality benefit, as above.   ***In the setting of patient's report of 2 to 3 days of new onset cough and sore throat                 ***  DVT prophylaxis: SCD's ***  Code Status: Full code*** Family Communication: none*** Disposition Plan: Per Rounding Team Consults called: none***;  Admission status: ***    PLEASE NOTE THAT DRAGON DICTATION SOFTWARE WAS USED IN THE CONSTRUCTION OF THIS NOTE.   [place] DO [facility]  From [place]   10/13/2021, 11:43 PM   ***

## 2021-10-13 NOTE — ED Notes (Signed)
Patient is being discharged from the Urgent Care and sent to the Emergency Department via carelink . Per E. Raspet PA, patient is in need of higher level of care for further evaluation of his symptoms. Patient is aware and verbalizes understanding of plan of care.  Vitals:   10/13/21 1335  BP: 133/74  Pulse: 73  Resp: 18  Temp: 97.7 F (36.5 C)  SpO2: 98%

## 2021-10-13 NOTE — ED Triage Notes (Signed)
Pt states fever,cough,sore throat,body aches,fatigue and no appetite  for the past 4 days. States he is feeling a little bit  better today.

## 2021-10-13 NOTE — ED Notes (Signed)
Blood not drawn, patient very anxious, refused to have blood drawn by this nurse.  Provider at bedside

## 2021-10-13 NOTE — ED Notes (Signed)
Report to Roselyn Reef RN at [facility].

## 2021-10-13 NOTE — ED Provider Notes (Signed)
Ultrasound ED Peripheral IV (Provider)  Date/Time: 10/13/2021 6:17 PM  Performed by: Tedd Sias, PA Authorized by: Tedd Sias, PA   Procedure details:    Indications: multiple failed IV attempts     Skin Prep: chlorhexidine gluconate     Location:  Right forearm   Angiocath:  20 G   Bedside Ultrasound Guided: Yes     Images: not archived     Patient tolerated procedure without complications: Yes     Dressing applied: Yes       Tedd Sias, PA 10/13/21 1817    Isla Pence, MD 10/13/21 2212

## 2021-10-13 NOTE — ED Triage Notes (Signed)
Bib carelink from urgent care; cold sx and fever since wed; son sick;  near syncopal episode when blood drawn; extensive cardiac hx,  ambulatory to stretcher, 137/89, HR 60s, RR 24, 97% RA; hx copd, afib, chf, AICD

## 2021-10-13 NOTE — ED Provider Notes (Signed)
[facility]    CSN: [id number] Arrival date & time: 10/13/21  1310      History   Chief Complaint Chief Complaint  Patient presents with   Cough    HPI Benjamin Cook is a 76 y.o. male.   Patient presents today with a 4-day history of subjective fever, chills, body aches, fatigue, cough, sore throat.  Denies any chest pain, shortness of breath, syncope, nausea, vomiting.  Denies any known sick contacts.  Reports symptoms were worse and have improved today.  He has not had COVID in the past.  He has had COVID-19 vaccinations including boosters.  He has not been taking any over-the-counter medication for symptom management.  Denies any recent steroid or antibiotic use.  He does have a significant past medical history for extensive cardiovascular disease, COPD.  He is having difficulty with daily activities as result of symptoms but reports that cough has been productive.    Past Medical History:  Diagnosis Date   Adrenal mass ([place])    per pt this is remote (10 years) and benign by biopsy   AICD (automatic cardioverter/defibrillator) present    Anxiety    Arthritis    CAD (coronary artery disease)    a. s/p PCI to LAD 2001 b. myoview 2014 high risk with scar LAD/RCA territory but no ischemia   Cardiomyopathy, ischemic    CHF (congestive heart failure) ([name])    class II/III   COPD (chronic obstructive pulmonary disease) ([place])    smokes cigars but has quit cigarettes   Defibrillator discharge 04/21/2019   Diverticulitis    Dyspnea    Flu 03/2015   HTN (hypertension)    Hyperlipidemia    NSVT (nonsustained ventricular tachycardia) ([place]) 03/05/2017   Paroxysmal atrial fibrillation ([place]) 03/2015   chads2vasc score is 4   PSA (psoriatic arthritis) ([place])    increased   PVD (peripheral vascular disease) ([place])     Patient Active Problem List   Diagnosis Date Noted   V tach ([place]) 10/11/2020   Abdominal pain 10/11/2020   Ventricular tachycardia ([place]) 10/11/2020    Hypokalemia 06/04/2020   Elevated troponin 06/04/2020   Malnutrition of moderate degree 06/04/2020   Defibrillator discharge 04/22/2019   Hypocalcemia 08/08/2017   Goals of care, counseling/discussion 08/08/2017   Anemia, iron deficiency 08/08/2017   NSVT (nonsustained ventricular tachycardia) ([place]) 03/05/2017   AAA (abdominal aortic aneurysm) ([place]) 03/03/2017   Low back pain 01/01/2017   Adrenal mass ([place]) 11/24/2016   Acute Sigmoid diverticulitis 11/15/2016   Diverticulitis 11/15/2016   ICD (implantable cardioverter-defibrillator) lead failure 07/16/2016   Paroxysmal atrial fibrillation ([place]) 06/26/2015   Dyspnea 04/10/2015   Acute respiratory failure ([place]) 03/28/2015   Essential hypertension 61/95/0932   Systolic CHF ([place]) 67/01/4579   Pyrexia    Shortness of breath    Influenza A    VT (ventricular tachycardia) ([place])    ICD (implantable cardioverter-defibrillator) in place 06/08/2010   HFrEF (heart failure with reduced ejection fraction) ([place]) 04/18/2010   WEIGHT LOSS 11/01/2008   TOBACCO ABUSE 09/06/2008   Aneurysm of abdominal vessel (5.6 CM) 09/06/2008   HLD (hyperlipidemia) 09/05/2008   COUGH 04/08/2007   PSA, INCREASED 04/08/2007   OTHER ABNORMAL BLOOD CHEMISTRY 03/24/2007   Adrenal nodule ([place]) 10/01/2006   Anxiety state 10/01/2006   HYPERTENSION 10/01/2006   CAD (coronary artery disease) 10/01/2006   Cardiomyopathy, ischemic 10/01/2006   COPD, severe ([place]) 10/01/2006   HYPERGLYCEMIA 10/01/2006   HEMATURIA, HX OF 10/01/2006  Past Surgical History:  Procedure Laterality Date   ABDOMINAL AORTIC ENDOVASCULAR STENT GRAFT N/A 03/03/2017   Procedure: ABDOMINAL AORTIC ENDOVASCULAR STENT GRAFT;  Surgeon: Waynetta Sandy, MD;  Location: [facility];  Service: Vascular;  Laterality: N/A;   cataract surgery     CORONARY ANGIOGRAPHY N/A 10/12/2020   Procedure: CORONARY ANGIOGRAPHY;  Surgeon: Lorretta Harp, MD;  Location: [facility];  Service:  Cardiovascular;  Laterality: N/A;   CORONARY ANGIOPLASTY WITH STENT PLACEMENT  2001   a. PCI to LAD   IMPLANTABLE CARDIOVERTER DEFIBRILLATOR (ICD) GENERATOR CHANGE N/A 03/18/2014   a. SJM Fortify ST DR ICD implanted by [facility] for primary prevention b. gen change 03/2014    LEAD REVISION/REPAIR N/A 07/17/2016   Procedure: Lead Revision/Repair;  Surgeon: Evans Lance, MD;  Location: [facility];  Service: Cardiovascular;  Laterality: N/A;   LEFT HEART CATHETERIZATION WITH CORONARY ANGIOGRAM N/A 05/27/2014   Procedure: LEFT HEART CATHETERIZATION WITH CORONARY ANGIOGRAM;  Surgeon: Sherren Mocha, MD;  Location[facility];  Service: Cardiovascular;  Laterality: N/A;   REPAIR KNEE LIGAMENT     V TACH ABLATION N/A 10/26/2020   Procedure: V TACH ABLATION;  Surgeon: Evans Lance, MD;  Location: [facility];  Service: Cardiovascular;  Laterality: N/A;       Home Medications    Prior to Admission medications   Medication Sig Start Date End Date Taking? Authorizing Provider  acetaminophen (TYLENOL) 500 MG tablet Take 500-1,000 mg by mouth every 8 (eight) hours as needed (for pain).     [provider]  amiodarone (PACERONE) 100 MG tablet Take 1 tablet (100 mg total) by mouth daily. 01/24/21   Shirley Friar, PA-C  atorvastatin (LIPITOR) 80 MG tablet TAKE 1 TABLET BY MOUTH DAILY AT 6 PM 04/30/21   Lelon Perla, MD  carvedilol (COREG) 6.25 MG tablet TAKE 1 TABLET BY MOUTH TWICE A DAY WITH MEALS 05/01/21   Shirley Friar, PA-C  empagliflozin (JARDIANCE) 10 MG TABS tablet Take 1 tablet (10 mg total) by mouth daily. 10/17/20   Charlynne Cousins, MD  feeding supplement (ENSURE ENLIVE / ENSURE PLUS) LIQD Try to drink 1 can 1-2 times a day. 06/04/20   Richardson Dopp T, PA-C  Fluticasone-Salmeterol (ADVAIR) 100-50 MCG/DOSE AEPB Inhale 1 puff into the lungs 2 (two) times daily. 01/07/20   Shirley Friar, PA-C  furosemide (LASIX) 40 MG tablet TAKE 1/2 TABLET BY  MOUTH DAILY 05/25/21   Evans Lance, MD  isosorbide mononitrate (IMDUR) 30 MG 24 hr tablet TAKE 1/2 OF A TABLET (15 MG TOTAL) BY MOUTH DAILY 11/24/20   Allred, Jeneen Rinks, MD  melatonin 5 MG TABS Take 1 tablet (5 mg total) by mouth at bedtime. 10/16/20   Charlynne Cousins, MD  mometasone-formoterol (DULERA) 100-5 MCG/ACT AERO Inhale 2 puffs into the lungs in the morning and at bedtime. 07/12/20   Allred, Jeneen Rinks, MD  Multiple Vitamin (MULTIVITAMIN) capsule Take 1 capsule by mouth daily.    [provider]  nitroGLYCERIN (NITROSTAT) 0.4 MG SL tablet Place 1 tablet (0.4 mg total) under the tongue every 5 (five) minutes x 3 doses as needed for chest pain. 06/04/20 06/04/21  Richardson Dopp T, PA-C  pantoprazole (PROTONIX) 40 MG tablet Take 1 tablet (40 mg total) by mouth daily. 10/16/20 10/16/21  Charlynne Cousins, MD  potassium chloride SA (KLOR-CON) 20 MEQ tablet Take 1 tablet (20 mEq total) by mouth daily. 12/06/20   Allred, Jeneen Rinks,  MD  Pseudoeph-Doxylamine-DM-APAP (NYQUIL PO) Take 1 Dose by mouth at bedtime as needed (sleep). Heart healty nyquil    [provider]  Rivaroxaban (XARELTO) 15 MG TABS tablet TAKE 1 TABLET BY MOUTH EVERY DAY WITH SUPPER 07/10/21   Crenshaw, Denice Bors, MD  sacubitril-valsartan (ENTRESTO) 97-103 MG TAKE 1 TABLET BY MOUTH TWICE A DAY 04/11/21   Lelon Perla, MD    Family History Family History  Problem Relation Age of Onset   Cirrhosis Mother        due to ETOH   Cancer Neg Hx     Social History Social History   Tobacco Use   Smoking status: Every Day    Types: Cigars   Smokeless tobacco: Never   Tobacco comments:    smokes cigars now, no longer smokes cigarettes but previously smoked 2ppd  Vaping Use   Vaping Use: Never used  Substance Use Topics   Alcohol use: No   Drug use: No     Allergies   Patient has no known allergies.   Review of Systems Review of Systems  Constitutional:  Positive for activity change, fatigue and fever. Negative  for appetite change.  HENT:  Positive for congestion and sore throat. Negative for sinus pressure and sneezing.   Respiratory:  Positive for cough. Negative for shortness of breath.   Cardiovascular:  Negative for chest pain.  Gastrointestinal:  Negative for abdominal pain, diarrhea, nausea and vomiting.  Neurological:  Negative for dizziness, light-headedness and headaches.     Physical Exam Triage Vital Signs ED Triage Vitals  Enc Vitals Group     BP 10/13/21 1335 133/74     Pulse Rate 10/13/21 1335 73     Resp 10/13/21 1335 18     Temp 10/13/21 1335 97.7 F (36.5 C)     Temp src --      SpO2 10/13/21 1335 98 %     Weight --      Height --      Head Circumference --      Peak Flow --      Pain Score 10/13/21 1337 0     Pain Loc --      Pain Edu? --      Excl. in [place]? --    No data found.  Updated Vital Signs BP 133/74 (BP Location: Left Arm)   Pulse 73   Temp 97.7 F (36.5 C)   Resp 18   SpO2 98%   Visual Acuity Right Eye Distance:   Left Eye Distance:   Bilateral Distance:    Right Eye Near:   Left Eye Near:    Bilateral Near:     Physical Exam Vitals reviewed.  Constitutional:      General: He is awake.     Appearance: Normal appearance. He is well-developed. He is not ill-appearing.     Comments: Very pleasant male appears stated age in no acute distress sitting comfortably in exam room  HENT:     Head: Normocephalic and atraumatic.     Right Ear: Tympanic membrane, ear canal and external ear normal. Tympanic membrane is not erythematous or bulging.     Left Ear: Tympanic membrane, ear canal and external ear normal. Tympanic membrane is not erythematous or bulging.     Nose: Nose normal.     Mouth/Throat:     Pharynx: Uvula midline. Posterior oropharyngeal erythema present. No oropharyngeal exudate.  Cardiovascular:     Rate and Rhythm: Normal rate and  regular rhythm.     Heart sounds: Normal heart sounds, S1 normal and S2 normal. No murmur  heard. Pulmonary:     Effort: Pulmonary effort is normal. No accessory muscle usage or respiratory distress.     Breath sounds: No stridor. Rhonchi present. No wheezing or rales.     Comments: Scattered rhonchi Abdominal:     General: Bowel sounds are normal.     Palpations: Abdomen is soft.     Tenderness: There is no abdominal tenderness.  Neurological:     Mental Status: He is alert.  Psychiatric:        Behavior: Behavior is cooperative.      UC Treatments / Results  Labs (all labs ordered are listed, but only abnormal results are displayed) Labs Reviewed  RESP PANEL BY RT-PCR (RSV, FLU A&B, COVID)  [id number]    EKG   Radiology DG Chest 2 View  Result Date: 10/13/2021 CLINICAL DATA:  Cough, fever EXAM: CHEST - 2 VIEW COMPARISON:  10/25/2020 FINDINGS: Cardiac size is within normal limits. Low position of diaphragms suggests COPD. There are new patchy interstitial markings in right upper and left lower lung fields. There are no signs of alveolar pulmonary edema. Pacemaker/defibrillator battery is seen in left infraclavicular region. There is no pleural effusion or pneumothorax. Asymmetric pleural density in the left apex has not changed. IMPRESSION: New small patchy interstitial infiltrates are seen in right upper and left lower lung fields suggesting multifocal atelectasis/pneumonia. Part of this finding may suggest underlying scarring. Electronically Signed   By: Elmer Picker M.D.   On: 10/13/2021 14:09    Procedures Procedures (including critical care time)  Medications Ordered in UC Medications - No data to display  Initial Impression / Assessment and Plan / UC Course  I have reviewed the triage vital signs and the nursing notes.  Pertinent labs & imaging results that were available during my care of the patient were reviewed by me and considered in my medical decision making (see chart for details).     On presentation patient was well-appearing and acting his  normal self.  Chest x-ray was obtained that showed multifocal pneumonia.  Lab work was ordered and when nursing staff went to get this patient began to state he was short of breath and feeling as though he was going to pass out.  His feet were raised and he was laid back with cool compress on his head and neck.  Vital signs are stable with oxygen saturation of 100% and pulse of 70 to 80 bpm during episode.  Suspect vasovagal episode as etiology of symptoms, however, he continued to feel lightheaded for several minutes despite these interventions and after further discussion we discussed he may benefit from going to the emergency room particularly as based on a curb 65 score he may need admission for pneumonia treatment.  Patient was agreeable and so [facility] was called to transport him to the ER.  He was stable at the time of discharge.  Final Clinical Impressions(s) / UC Diagnoses   Final diagnoses:  Multifocal pneumonia  Lightheadedness   Discharge Instructions   None    ED Prescriptions   None    PDMP not reviewed this encounter.   Terrilee Croak, PA-C 10/13/21 1442

## 2021-10-13 NOTE — ED Provider Notes (Signed)
[facility] Provider Note   CSN: [id number] Arrival date & time: 10/13/21  1452     History  Chief Complaint  Patient presents with   vagal/fatigue/UCC    Benjamin Cook is a 76 y.o. male history of CAD, heart failure, ICD, V. tach, hypertension, A-fib, AAA here for evaluation of near syncope.  Patient went to urgent care for fatigue, cough over the last few days.  She was diagnosed with multifocal pneumonia.  They went to get blood on him when he had a syncopal episode. Stated that he felt hot, flushed all over and felt like his defibrillator was going to go off however he did not feel it fire.  Says he occasionally feels short of breath however this is chronic in nature.  He is unsure if he feels more short of breath than normal.  He has had hot and cold chills or is not taken his temperature.  No recent COVID exposures.  No headache, vision changes, numbness, lateral weakness, Abdominal pain, nausea or vomiting.  No PND, orthopnea.  Sx started on Wed 3 days PTA  On Xarelto for Afib  HPI     Home Medications Prior to Admission medications   Medication Sig Start Date End Date Taking? Authorizing Provider  acetaminophen (TYLENOL) 500 MG tablet Take 500-1,000 mg by mouth every 8 (eight) hours as needed (for pain).     [provider]  amiodarone (PACERONE) 100 MG tablet Take 1 tablet (100 mg total) by mouth daily. 01/24/21   Shirley Friar, PA-C  atorvastatin (LIPITOR) 80 MG tablet TAKE 1 TABLET BY MOUTH DAILY AT 6 PM 04/30/21   Lelon Perla, MD  carvedilol (COREG) 6.25 MG tablet TAKE 1 TABLET BY MOUTH TWICE A DAY WITH MEALS 05/01/21   Shirley Friar, PA-C  empagliflozin (JARDIANCE) 10 MG TABS tablet Take 1 tablet (10 mg total) by mouth daily. 10/17/20   Charlynne Cousins, MD  feeding supplement (ENSURE ENLIVE / ENSURE PLUS) LIQD Try to drink 1 can 1-2 times a day. 06/04/20   Richardson Dopp T, PA-C   Fluticasone-Salmeterol (ADVAIR) 100-50 MCG/DOSE AEPB Inhale 1 puff into the lungs 2 (two) times daily. 01/07/20   Shirley Friar, PA-C  furosemide (LASIX) 40 MG tablet TAKE 1/2 TABLET BY MOUTH DAILY 05/25/21   Evans Lance, MD  isosorbide mononitrate (IMDUR) 30 MG 24 hr tablet TAKE 1/2 OF A TABLET (15 MG TOTAL) BY MOUTH DAILY 11/24/20   Allred, Jeneen Rinks, MD  melatonin 5 MG TABS Take 1 tablet (5 mg total) by mouth at bedtime. 10/16/20   Charlynne Cousins, MD  mometasone-formoterol (DULERA) 100-5 MCG/ACT AERO Inhale 2 puffs into the lungs in the morning and at bedtime. 07/12/20   Allred, Jeneen Rinks, MD  Multiple Vitamin (MULTIVITAMIN) capsule Take 1 capsule by mouth daily.    [provider]  nitroGLYCERIN (NITROSTAT) 0.4 MG SL tablet Place 1 tablet (0.4 mg total) under the tongue every 5 (five) minutes x 3 doses as needed for chest pain. 06/04/20 06/04/21  Richardson Dopp T, PA-C  pantoprazole (PROTONIX) 40 MG tablet Take 1 tablet (40 mg total) by mouth daily. 10/16/20 10/16/21  Charlynne Cousins, MD  potassium chloride SA (KLOR-CON) 20 MEQ tablet Take 1 tablet (20 mEq total) by mouth daily. 12/06/20   Allred, Jeneen Rinks, MD  Pseudoeph-Doxylamine-DM-APAP (NYQUIL PO) Take 1 Dose by mouth at bedtime as needed (sleep). Heart healty nyquil    [provider]  Rivaroxaban Alveda Reasons)  15 MG TABS tablet TAKE 1 TABLET BY MOUTH EVERY DAY WITH SUPPER 07/10/21   Lelon Perla, MD  sacubitril-valsartan (ENTRESTO) 97-103 MG TAKE 1 TABLET BY MOUTH TWICE A DAY 04/11/21   Lelon Perla, MD      Allergies    Patient has no known allergies.    Review of Systems   Review of Systems  Constitutional:  Positive for activity change and fatigue.  HENT:  Positive for congestion and rhinorrhea.   Respiratory:  Positive for cough and shortness of breath (chronic).   Gastrointestinal: Negative.   Genitourinary: Negative.   Musculoskeletal: Negative.   Skin: Negative.   Neurological:  Positive for syncope  (near syncope) and weakness (generalized).  All other systems reviewed and are negative.   Physical Exam Updated Vital Signs BP 121/67 (BP Location: Left Arm)   Pulse 74   Temp 98.2 F (36.8 C) (Oral)   Resp 14   SpO2 96%  Physical Exam Vitals and nursing note reviewed.  Constitutional:      General: He is not in acute distress.    Appearance: He is well-developed. He is not ill-appearing, toxic-appearing or diaphoretic.  HENT:     Head: Normocephalic and atraumatic.     Nose: Congestion and rhinorrhea present.     Mouth/Throat:     Mouth: Mucous membranes are moist.  Eyes:     Pupils: Pupils are equal, round, and reactive to light.  Cardiovascular:     Rate and Rhythm: Normal rate and regular rhythm.     Pulses: Normal pulses.     Heart sounds: Normal heart sounds.  Pulmonary:     Effort: Pulmonary effort is normal. No respiratory distress.     Breath sounds: Wheezing present.     Comments: Expiratory wheezing and rhonchi at lower lobes. Abdominal:     General: There is no distension.     Palpations: Abdomen is soft.     Tenderness: There is no abdominal tenderness. There is no right CVA tenderness, left CVA tenderness, guarding or rebound.  Musculoskeletal:        General: No swelling, tenderness, deformity or signs of injury. Normal range of motion.     Cervical back: Normal range of motion and neck supple.     Right lower leg: No edema.     Left lower leg: No edema.     Comments: Soft, nontender, full range of motion, no lower extremity edema.  Skin:    General: Skin is warm and dry.     Capillary Refill: Capillary refill takes less than 2 seconds.  Neurological:     General: No focal deficit present.     Mental Status: He is alert and oriented to person, place, and time.     ED Results / Procedures / Treatments   Labs (all labs ordered are listed, but only abnormal results are displayed) Labs Reviewed  RESP PANEL BY RT-PCR (FLU A&B, COVID) ARPGX2 -  Abnormal; Notable for the following components:      Result Value   SARS Coronavirus 2 by RT PCR POSITIVE (*)    All other components within normal limits  CBC WITH DIFFERENTIAL/PLATELET - Abnormal; Notable for the following components:   Hemoglobin 11.0 (*)    HCT 33.7 (*)    MCV 79.9 (*)    RDW 17.2 (*)    Monocytes Absolute 1.2 (*)    All other components within normal limits  COMPREHENSIVE METABOLIC PANEL - Abnormal; Notable for the following  components:   Glucose, Bld 118 (*)    Calcium 8.6 (*)    Albumin 3.2 (*)    AST 73 (*)    ALT 67 (*)    All other components within normal limits  BRAIN NATRIURETIC PEPTIDE - Abnormal; Notable for the following components:   B Natriuretic Peptide 159.5 (*)    All other components within normal limits  URINALYSIS, ROUTINE W REFLEX MICROSCOPIC - Abnormal; Notable for the following components:   APPearance HAZY (*)    All other components within normal limits  TROPONIN I (HIGH SENSITIVITY) - Abnormal; Notable for the following components:   Troponin I (High Sensitivity) 25 (*)    All other components within normal limits  TROPONIN I (HIGH SENSITIVITY) - Abnormal; Notable for the following components:   Troponin I (High Sensitivity) 26 (*)    All other components within normal limits    EKG None  Radiology CT Angio Chest PE W and/or Wo Contrast  Result Date: 10/13/2021 CLINICAL DATA:  High probability for pulmonary embolism.  Fever. EXAM: CT ANGIOGRAPHY CHEST WITH CONTRAST TECHNIQUE: Multidetector CT imaging of the chest was performed using the standard protocol during bolus administration of intravenous contrast. Multiplanar CT image reconstructions and MIPs were obtained to evaluate the vascular anatomy. RADIATION DOSE REDUCTION: This exam was performed according to the departmental dose-optimization program which includes automated exposure control, adjustment of the mA and/or kV according to patient size and/or use of iterative  reconstruction technique. CONTRAST:  74m OMNIPAQUE IOHEXOL 350 MG/ML SOLN COMPARISON:  CT angiogram chest 06/03/2007 FINDINGS: Cardiovascular: There is adequate opacification of the pulmonary arteries to the segmental level. There is no evidence for pulmonary embolism. The heart is moderately enlarged. There is no pericardial effusion. Left-sided pacemaker is present. Aorta is normal in size. There are atherosclerotic calcifications of the aorta and coronary arteries. Mediastinum/Nodes: There is a small hiatal hernia. Esophagus is within normal limits. Visualized thyroid gland is within normal limits. No enlarged lymph nodes are seen. Lungs/Pleura: Moderate emphysematous changes have progressed. There is mucous plugging and bronchial wall thickening throughout the left lower lobe. There is a new nodular density abutting the right hilum in the posterior right upper lobe image 6/77 measuring 1.8 x 1.9 by 2.0 cm. There is a new band of likely scarring in the right upper lobe. No pleural effusion or pneumothorax. Scarring in both lung apices persists. Upper Abdomen: Right adrenal mass containing some punctate calcifications measuring 4.2 by 2.6 cm appears unchanged from 2009 and is favored as benign given stability. Right renal cyst is partially visualized measuring 16 mm. Musculoskeletal: No chest wall abnormality. No acute or significant osseous findings. Review of the MIP images confirms the above findings. IMPRESSION: 1. No evidence for pulmonary embolism. 2. New 20 mm right solid pulmonary nodule within the upper lobe. Per Fleischner Society Guidelines, consider a non-contrast Chest CT at 3 months, a PET/CT, or tissue sampling. These guidelines do not apply to immunocompromised patients and patients with cancer. Follow up in patients with significant comorbidities as clinically warranted. For lung cancer screening, adhere to Lung-RADS guidelines. Reference: Radiology. 2017; 284(1):228-43. 3. Left lower lobe  peribronchial wall thickening and mucous plugging may related to infection/aspiration. 4. 1.6 cm right Bosniak 1 benign simple cyst. No follow-up imaging is recommended. JACR 2018 Feb; 264-273, Management of the Incidental RenalMass on CT, [employer] 2021; 814-848, Bosniak Classification of Cystic Renal Masses, Version 2019. 5. Cardiomegaly. Aortic Atherosclerosis (ICD10-I70.0) and Emphysema (ICD10-J43.9). Electronically Signed   By: AWarren Lacy  Dagoberto Reef M.D.   On: 10/13/2021 21:21   DG Chest 2 View  Result Date: 10/13/2021 CLINICAL DATA:  Cough, fever EXAM: CHEST - 2 VIEW COMPARISON:  10/25/2020 FINDINGS: Cardiac size is within normal limits. Low position of diaphragms suggests COPD. There are new patchy interstitial markings in right upper and left lower lung fields. There are no signs of alveolar pulmonary edema. Pacemaker/defibrillator battery is seen in left infraclavicular region. There is no pleural effusion or pneumothorax. Asymmetric pleural density in the left apex has not changed. IMPRESSION: New small patchy interstitial infiltrates are seen in right upper and left lower lung fields suggesting multifocal atelectasis/pneumonia. Part of this finding may suggest underlying scarring. Electronically Signed   By: Elmer Picker M.D.   On: 10/13/2021 14:09    Procedures Procedures    Medications Ordered in ED Medications  methylPREDNISolone sodium succinate (SOLU-MEDROL) 125 mg/2 mL injection 125 mg (has no administration in time range)  ipratropium-albuterol (DUONEB) 0.5-2.5 (3) MG/3ML nebulizer solution 3 mL (3 mLs Nebulization Given 10/13/21 1627)  cefTRIAXone (ROCEPHIN) 2 g in sodium chloride 0.9 % 100 mL IVPB (0 g Intravenous Stopped 10/13/21 2046)  azithromycin (ZITHROMAX) 500 mg in sodium chloride 0.9 % 250 mL IVPB (0 mg Intravenous Stopped 10/13/21 2245)  iohexol (OMNIPAQUE) 350 MG/ML injection 80 mL (80 mLs Intravenous Contrast Given 10/13/21 2103)    ED Course/ Medical Decision Making/  A&P Clinical Course as of 10/13/21 2330  Sat Oct 13, 2021  1920 Cardiac device interrogated, spoke w/ rep; no arrhythmia or defib activation reported.  [SG]    Clinical Course User Index [SG] Jeanell Sparrow, DO    76 year old here with extensive cardiac history including A-fib on chronic anticoagulation, AAA, CAD, CHF here for evaluation of feeling unwell and near syncope.  Has had 3 days of subjective fever, cough, congestion rhinorrhea and overall fatigue.  Was seen in urgent care had chest x-ray which showed multifocal pneumonia.  They attempted to draw blood when he became diaphoretic, hot, lightheaded dizzy and thought he was going to pass out.  He was placed in the Trendelenburg position.  Patient states it felt like his defibrillator was going to go off and he was going to pass out.  He did not feel his defibrillator fired at that time.  He is no PND orthopnea.  He does not appear grossly fluid overloaded.  Does have some wheeze and rhonchi at lower lobes.  Plan on labs, imaging and reassess, interrogate ICD  Labs and imaging personally viewed and interpreted:  X-ray shows multifocal pneumonia and scarring CBC without significant abnormality CMP AST 73, ALT 67 BNP 159 UA neg COVID>> From UC positive EKG without ischemic changes  Reassessed.  CTA reassuring without evidence of PE.  We will admit for COVID and syncope work-up. Patient and family agreeable  CONSULT with Dr. Velia Meyer with [facility] who I agreeable to evaluate patient for admission  The patient appears reasonably stabilized for admission considering the current resources, flow, and capabilities available in the ED at this time, and I doubt any other [facility] requiring further screening and/or treatment in the ED prior to admission.                            Medical Decision Making Amount and/or Complexity of Data Reviewed Labs: ordered. Radiology: ordered.  Risk Prescription drug management. Decision regarding  hospitalization.         Final Clinical Impression(s) / ED Diagnoses  Final diagnoses:  COVID  Syncope, unspecified syncope type  Multifocal pneumonia    Rx / DC Orders ED Discharge Orders     None         Hazim Treadway A, PA-C 10/13/21 2330    Jeanell Sparrow, DO 10/15/21 0006

## 2021-10-14 ENCOUNTER — Encounter (HOSPITAL_COMMUNITY): Payer: Self-pay | Admitting: Internal Medicine

## 2021-10-14 DIAGNOSIS — D638 Anemia in other chronic diseases classified elsewhere: Secondary | ICD-10-CM | POA: Diagnosis present

## 2021-10-14 DIAGNOSIS — I5042 Chronic combined systolic (congestive) and diastolic (congestive) heart failure: Secondary | ICD-10-CM | POA: Diagnosis present

## 2021-10-14 DIAGNOSIS — R531 Weakness: Secondary | ICD-10-CM | POA: Diagnosis not present

## 2021-10-14 DIAGNOSIS — U071 COVID-19: Secondary | ICD-10-CM | POA: Diagnosis not present

## 2021-10-14 DIAGNOSIS — R55 Syncope and collapse: Secondary | ICD-10-CM | POA: Diagnosis present

## 2021-10-14 DIAGNOSIS — R918 Other nonspecific abnormal finding of lung field: Secondary | ICD-10-CM | POA: Diagnosis present

## 2021-10-14 DIAGNOSIS — C3490 Malignant neoplasm of unspecified part of unspecified bronchus or lung: Secondary | ICD-10-CM | POA: Diagnosis present

## 2021-10-14 DIAGNOSIS — R911 Solitary pulmonary nodule: Secondary | ICD-10-CM | POA: Diagnosis present

## 2021-10-14 DIAGNOSIS — R7401 Elevation of levels of liver transaminase levels: Secondary | ICD-10-CM | POA: Diagnosis present

## 2021-10-14 DIAGNOSIS — D649 Anemia, unspecified: Secondary | ICD-10-CM | POA: Diagnosis present

## 2021-10-14 LAB — CBC WITH DIFFERENTIAL/PLATELET
Abs Immature Granulocytes: 0.02 10*3/uL (ref 0.00–0.07)
Basophils Absolute: 0 10*3/uL (ref 0.0–0.1)
Basophils Relative: 0 %
Eosinophils Absolute: 0 10*3/uL (ref 0.0–0.5)
Eosinophils Relative: 0 %
HCT: 32.4 % — ABNORMAL LOW (ref 39.0–52.0)
Hemoglobin: 10 g/dL — ABNORMAL LOW (ref 13.0–17.0)
Immature Granulocytes: 0 %
Lymphocytes Relative: 8 %
Lymphs Abs: 0.4 10*3/uL — ABNORMAL LOW (ref 0.7–4.0)
MCH: 25.1 pg — ABNORMAL LOW (ref 26.0–34.0)
MCHC: 30.9 g/dL (ref 30.0–36.0)
MCV: 81.4 fL (ref 80.0–100.0)
Monocytes Absolute: 0.1 10*3/uL (ref 0.1–1.0)
Monocytes Relative: 2 %
Neutro Abs: 4.8 10*3/uL (ref 1.7–7.7)
Neutrophils Relative %: 90 %
Platelets: 279 10*3/uL (ref 150–400)
RBC: 3.98 MIL/uL — ABNORMAL LOW (ref 4.22–5.81)
RDW: 17.2 % — ABNORMAL HIGH (ref 11.5–15.5)
WBC: 5.3 10*3/uL (ref 4.0–10.5)
nRBC: 0 % (ref 0.0–0.2)

## 2021-10-14 LAB — FERRITIN: Ferritin: 35 ng/mL (ref 24–336)

## 2021-10-14 LAB — COMPREHENSIVE METABOLIC PANEL
ALT: 61 U/L — ABNORMAL HIGH (ref 0–44)
AST: 62 U/L — ABNORMAL HIGH (ref 15–41)
Albumin: 3 g/dL — ABNORMAL LOW (ref 3.5–5.0)
Alkaline Phosphatase: 59 U/L (ref 38–126)
Anion gap: 12 (ref 5–15)
BUN: 9 mg/dL (ref 8–23)
CO2: 23 mmol/L (ref 22–32)
Calcium: 8.5 mg/dL — ABNORMAL LOW (ref 8.9–10.3)
Chloride: 102 mmol/L (ref 98–111)
Creatinine, Ser: 1.29 mg/dL — ABNORMAL HIGH (ref 0.61–1.24)
GFR, Estimated: 57 mL/min — ABNORMAL LOW (ref >60)
Glucose, Bld: 218 mg/dL — ABNORMAL HIGH (ref 70–99)
Potassium: 3.7 mmol/L (ref 3.5–5.1)
Sodium: 137 mmol/L (ref 135–145)
Total Bilirubin: 0.3 mg/dL (ref 0.3–1.2)
Total Protein: 7 g/dL (ref 6.5–8.1)

## 2021-10-14 LAB — MAGNESIUM
Magnesium: 1.9 mg/dL (ref 1.7–2.4)
Magnesium: 1.9 mg/dL (ref 1.7–2.4)

## 2021-10-14 LAB — I-STAT VENOUS BLOOD GAS, ED
Acid-Base Excess: 1 mmol/L (ref 0.0–2.0)
Bicarbonate: 25.2 mmol/L (ref 20.0–28.0)
Calcium, Ion: 1.08 mmol/L — ABNORMAL LOW (ref 1.15–1.40)
HCT: 37 % — ABNORMAL LOW (ref 39.0–52.0)
Hemoglobin: 12.6 g/dL — ABNORMAL LOW (ref 13.0–17.0)
O2 Saturation: 98 %
Potassium: 3.7 mmol/L (ref 3.5–5.1)
Sodium: 139 mmol/L (ref 135–145)
TCO2: 26 mmol/L (ref 22–32)
pCO2, Ven: 40 mmHg — ABNORMAL LOW (ref 44–60)
pH, Ven: 7.408 (ref 7.25–7.43)
pO2, Ven: 97 mmHg — ABNORMAL HIGH (ref 32–45)

## 2021-10-14 LAB — PHOSPHORUS: Phosphorus: 2.5 mg/dL (ref 2.5–4.6)

## 2021-10-14 LAB — PROCALCITONIN: Procalcitonin: <0.1 ng/mL

## 2021-10-14 LAB — C-REACTIVE PROTEIN: CRP: 4.4 mg/dL — ABNORMAL HIGH (ref <1.0)

## 2021-10-14 LAB — PROTIME-INR
INR: 1.2 (ref 0.8–1.2)
Prothrombin Time: 15.3 seconds — ABNORMAL HIGH (ref 11.4–15.2)

## 2021-10-14 LAB — ACETAMINOPHEN LEVEL: Acetaminophen (Tylenol), Serum: <10 ug/mL — ABNORMAL LOW (ref 10–30)

## 2021-10-14 LAB — TSH: TSH: 0.263 u[IU]/mL — ABNORMAL LOW (ref 0.350–4.500)

## 2021-10-14 LAB — LACTATE DEHYDROGENASE: LDH: 164 U/L (ref 98–192)

## 2021-10-14 MED ORDER — POTASSIUM CHLORIDE CRYS ER 20 MEQ PO TBCR
20.0000 meq | EXTENDED_RELEASE_TABLET | Freq: Every day | ORAL | Status: DC
Start: 2021-10-14 — End: 2021-10-14
  Administered 2021-10-14: 20 meq via ORAL
  Filled 2021-10-14: qty 1

## 2021-10-14 MED ORDER — AMIODARONE HCL 200 MG PO TABS
100.0000 mg | ORAL_TABLET | Freq: Every day | ORAL | Status: DC
  Administered 2021-10-14: 100 mg via ORAL
  Filled 2021-10-14: qty 1

## 2021-10-14 MED ORDER — MOMETASONE FURO-FORMOTEROL FUM 100-5 MCG/ACT IN AERO
2.0000 | INHALATION_SPRAY | Freq: Two times a day (BID) | RESPIRATORY_TRACT | Status: DC
  Filled 2021-10-14: qty 8.8

## 2021-10-14 MED ORDER — FUROSEMIDE 20 MG PO TABS
20.0000 mg | ORAL_TABLET | Freq: Every day | ORAL | Status: DC
  Administered 2021-10-14: 20 mg via ORAL
  Filled 2021-10-14: qty 1

## 2021-10-14 MED ORDER — MOLNUPIRAVIR EUA 200MG CAPSULE: 4.0000 | ORAL_CAPSULE | Freq: Two times a day (BID) | ORAL | 0 refills | Status: AC

## 2021-10-14 MED ORDER — PANTOPRAZOLE SODIUM 40 MG PO TBEC
40.0000 mg | DELAYED_RELEASE_TABLET | Freq: Every day | ORAL | Status: DC
  Administered 2021-10-14: 40 mg via ORAL
  Filled 2021-10-14: qty 1

## 2021-10-14 MED ORDER — RIVAROXABAN 15 MG PO TABS: 15.0000 mg | ORAL_TABLET | Freq: Every day | ORAL | Status: DC

## 2021-10-14 MED ORDER — CARVEDILOL 3.125 MG PO TABS
6.2500 mg | ORAL_TABLET | Freq: Two times a day (BID) | ORAL | Status: DC
  Administered 2021-10-14: 6.25 mg via ORAL
  Filled 2021-10-14: qty 2

## 2021-10-14 MED ORDER — SACUBITRIL-VALSARTAN 97-103 MG PO TABS
1.0000 | ORAL_TABLET | Freq: Two times a day (BID) | ORAL | Status: DC
  Filled 2021-10-14: qty 1

## 2021-10-14 MED ORDER — ISOSORBIDE MONONITRATE ER 30 MG PO TB24
15.0000 mg | ORAL_TABLET | Freq: Every day | ORAL | Status: DC
  Administered 2021-10-14: 15 mg via ORAL
  Filled 2021-10-14: qty 1

## 2021-10-14 NOTE — Discharge Instructions (Signed)
Follow up with your doctor as we discussed and make sure they know about the lung nodule. You will need another CT scan in 3 months to make sure it's not cancer.

## 2021-10-14 NOTE — Discharge Summary (Signed)
Physician Discharge Summary   Patient: Benjamin Cook MRN: [id number] DOB: Nov 04, 1945  Admit date:     10/13/2021  Discharge date: 10/14/21  Discharge Physician: Patrecia Pour   PCP: Roselee Nova, MD   Recommendations at discharge:  Follow up with PCP for continued thyroid function testing. TSH noted to be mildly low with free T3 and T4 pending at discharge. Nondedicated CT showed no significant thyroid abnormality.  Will need repeat noncontrast chest CT in 2 months to monitor RUL pulmonary nodule.  Suggest repeat CBC (monitor lymphopenia, anemia of chronic disease) and CMP (mild LFT elevation consistent with covid-19 infection).  Continue molnupiravir x5 days for covid-19 infection without hypoxia or pneumonia.   Discharge Diagnoses: Principal Problem:   Generalized weakness Active Problems:   HLD (hyperlipidemia)   COPD, severe ([name])   Paroxysmal atrial fibrillation ([name])   COVID-19 virus infection   Syncope, vasovagal   Transaminitis   Pulmonary nodule   Chronic combined systolic and diastolic heart failure ([name])   Anemia of chronic disease  Hospital Course: H&P from last night by Dr. Velia Meyer: HPI: Benjamin Cook is a 76 y.o. male with medical history significant for chronic systolic/diastolic heart failure, status post ICD, paroxysmal atrial fibrillation chronically anticoagulated on Xarelto, COPD, essential hypertension, hyperlipidemia, chronically elevated troponin, anemia of chronic disease associated baseline hemoglobin range of 11-13, who is admitted to [facility] on 10/13/2021 with generalized weakness after presenting from home to [facility] ED complaining of such.    The patient reports 3 days of generalized weakness in the absence of any acute focal weakness.  He notes that this has been associated with new onset nonproductive cough as well as mild shortness of breath in the absence of any orthopnea, PND, or worsening of peripheral edema.  Denies any associated  hemoptysis, wheezing.  Conveys associated subjective fever in the absence of chills, full body rigors, or generalized myalgias.  No recent headache, neck stiffness, sore throat, nausea, vomiting, abdominal pain, diarrhea, or rash.  Denies any recent dysuria or gross hematuria.  No recent chest pain, diaphoresis, or palpitations.   In the setting of this generalized weakness, the patient presented to urgent care on 10/13/2021, where COVID-19 PCR was found to be positive.  As the patient's blood was being drawn for laboratory analysis, the patient conveys that he experienced a single episode of syncope with prodrome.  This was witnessed, and reportedly was not associated with any tonic-clonic activity nor any tongue biting or any loss of bowel/bladder function.  Patient did not hit his head as a component of this fall.  Denies any ensuing headache or neck pain.  Also denies any ensuing acute focal arthralgias or myalgias.   In the context of a history of paroxysmal atrial fibrillation, the patient reports good compliance with chronic anticoagulation on Xarelto.   Per chart review, most recent prior liver enzymes were checked in February 2023 and were notable for the following: AST 23, ALT 17.   Following this syncopal episode, patient was instructed to present to the emergency department for further evaluation, prompting him to present to [facility] emergency department for this purpose.       ED Course:  While in the ED, the patient's ICD was interrogated and showed no evidence of arrhythmia corresponding to the timing of a single episode of syncope with prodrome.   Vital signs in the ED were notable for the following: Afebrile; heart rate 09-38; systolic blood pressure in the 120s  140s; respiratory rate 16-23, oxygen saturation 95 to 90% on room air.   Labs were notable for the following: COVID-19 PCR positive, will influenza AMB PCR negative.  CMP notable for the following: Sodium 139, potassium 4.2,  bicarbonate 28, creatinine 1.16, glucose 118, calcium, adjusted for mild hypoalbuminemia noted to be 9.2, albumin 3.2, alkaline phosphatase and total bilirubin within normal limits.  AST 73, ALT 67.  BNP 160 compared to most recent prior value of 130 in April 2022.  High-sensitivity troponin I 25, with repeat value noted to be 26, which is relative to most recent prior value of 19 in February 2023 as well as 22 in September 2022.  CBC notable for white cell count 8700, hemoglobin 11.0 associated with normocytic/normochromic findings, which is relative to most recent prior hemoglobin value of 11.2 in June 2023.  Procalcitonin less than 0.10.  Urinalysis notable for no white blood cells.   Imaging and additional notable ED work-up: EKG shows sinus rhythm with frequent branch block, heart rate 65 and no evidence of T wave or ST changes, including no evidence of ST elevation.  CTA chest with PE protocol showed no evidence of acute pulmonary embolism, while showing evidence of new 20 mm right upper lobe solid pulmonary nodule, with associated with radiology recommendation for follow-up noncontrast CT chest in 3 months, while the CTA chest showed no evidence of infiltrate, edema, effusion, or pneumothorax.   While in the ED, the following were administered: Duo nebulizer treatment, Solu-Medrol 125 mg IV x1.  At azithromycin, Rocephin.   Subsequently, the patient was admitted for further evaluation management of presenting generalized weakness, with incidental COVID-19 positive finding.   Hospital Course: When examined the following morning the patient is in good spirits, denying any dyspnea or chest pain or cough. His fatigue seems to have improved as well. He wants to be discharged home this morning. PT and OT evaluated him and agree that he is safe for discharge at this time. He will be given molnupiravir to complete a 5 day course, understands isolation guidelines, and will follow up with his PCP at the end of  the isolation period, or return for care if symptoms worsen.   Assessment and Plan: Generalized weakness: Without focal findings on exam, suspect viral syndrome in elderly patient. No alternative/additional etiology identified at this time.  - PT/OT have cleared him for discharge with significant improvement since initiating covid treatment.   Covid-19 infection: This was tested due to cough and weakness, positive PCR on 9/9, symptoms began 9/6 per patient. This is not incidental.  - Complete 5 day course of molnupiravir - Follow CDC isolation guidelines - No pneumonia noted on CT chest, and no hypoxia. Respiratory symptoms have resolved.  - PCT negative arguing against superimposed bacterial infection at this time.   Syncope: while having blood drawn, no complete loss of consciousness, so not true syncope. AICD interrogated and showed no concomitant dysrhythmia in ED. Perhaps vasovagal response accentuated by covid infection. Has not recurred.   LFT elevation:  - Recheck at follow up   Right upper lobe pulmonary nodule: 9/9 CT chest revealed 20 mm right upper lobe pulmonary nodule, which is reportedly new relative to previous imaging, with corresponding radiology recommendation for follow-up noncontrast CT chest in 3 months.  - Recommend follow-up noncontrast CT chest in 3 months, per radiology recommendations, as above.   Chronic combined systolic and diastolic heart failure: Appears euvolemic/compensated at this time. Most recent echocardiogram performed in September 2022, which is  notable for LVEF 30 to 35%, no focal wall motion abnormalities, grade 1 diastolic dysfunction, normal right ventricular systolic function, trivial mitral vegetation.  - Continue home medications  CAD: No chest pain or ischemic ECG changes. Troponin 25 > 26 consistent with very very mild demand myocardial ischemia.    Paroxysmal atrial fibrillation: NSR during admission. - Continue xarelto, amiodarone, and  coreg.   COPD: Former smoker. No wheezing.  - Continue home Tx.    HLD: Ok to continue statin, LFTs declining already and mild.    Anemia of chronic disease: Documented history of such, a/w with baseline hgb range 11-13, at that baseline.    Consultants: None Procedures performed: None  Disposition: Home Diet recommendation:  Discharge Diet Orders (From admission, onward)     Start     Ordered   10/14/21 0000  Diet - low sodium heart healthy        10/14/21 0956           Cardiac diet DISCHARGE MEDICATION: Allergies as of 10/14/2021   No Known Allergies      Medication List     TAKE these medications    acetaminophen 500 MG tablet Commonly known as: TYLENOL Take 500-1,000 mg by mouth every 8 (eight) hours as needed (for pain).   amiodarone 100 MG tablet Commonly known as: PACERONE Take 1 tablet (100 mg total) by mouth daily.   atorvastatin 80 MG tablet Commonly known as: LIPITOR TAKE 1 TABLET BY MOUTH DAILY AT 6 PM What changed:  when to take this additional instructions   carvedilol 6.25 MG tablet Commonly known as: COREG TAKE 1 TABLET BY MOUTH TWICE A DAY WITH MEALS   Dulera 100-5 MCG/ACT Aero Generic drug: mometasone-formoterol Inhale 2 puffs into the lungs in the morning and at bedtime.   empagliflozin 10 MG Tabs tablet Commonly known as: JARDIANCE Take 1 tablet (10 mg total) by mouth daily.   Entresto 97-103 MG Generic drug: sacubitril-valsartan TAKE 1 TABLET BY MOUTH TWICE A DAY   feeding supplement Liqd Try to drink 1 can 1-2 times a day.   Fluticasone-Salmeterol 100-50 MCG/DOSE Aepb Commonly known as: ADVAIR Inhale 1 puff into the lungs 2 (two) times daily. What changed: how much to take   furosemide 40 MG tablet Commonly known as: LASIX TAKE 1/2 TABLET BY MOUTH DAILY   isosorbide mononitrate 30 MG 24 hr tablet Commonly known as: IMDUR TAKE 1/2 OF A TABLET (15 MG TOTAL) BY MOUTH DAILY What changed: See the new instructions.    melatonin 5 MG Tabs Take 1 tablet (5 mg total) by mouth at bedtime.   molnupiravir EUA 200 mg Caps capsule Commonly known as: LAGEVRIO Take 4 capsules (800 mg total) by mouth 2 (two) times daily for 5 days.   multivitamin capsule Take 1 capsule by mouth daily.   nitroGLYCERIN 0.4 MG SL tablet Commonly known as: NITROSTAT Place 1 tablet (0.4 mg total) under the tongue every 5 (five) minutes x 3 doses as needed for chest pain.   pantoprazole 40 MG tablet Commonly known as: Protonix Take 1 tablet (40 mg total) by mouth daily.   potassium chloride SA 20 MEQ tablet Commonly known as: KLOR-CON M Take 1 tablet (20 mEq total) by mouth daily.   Xarelto 15 MG Tabs tablet Generic drug: Rivaroxaban TAKE 1 TABLET BY MOUTH EVERY DAY WITH SUPPER What changed: See the new instructions.        Follow-up Information     Keith Rake Asad A,  MD. Schedule an appointment as soon as possible for a visit in 10 day(s).   Specialty: Family Medicine Contact information: [place] [address] [phone]                Discharge Exam: BP (!) 140/82 (BP Location: Left Arm)   Pulse 74   Temp 98.3 F (36.8 C) (Oral)   Resp 18   SpO2 96%   Elderly, well-appearing gentleman sitting on EOB in no distress RRR without MRG. No significant edema.  Clear and nonlabored with normal rate and SpO2 >94% at this time.  No palpable thyroid nodules  Condition at discharge: stable  The results of significant diagnostics from this hospitalization (including imaging, microbiology, ancillary and laboratory) are listed below for reference.   Imaging Studies: CT Angio Chest PE W and/or Wo Contrast  Result Date: 10/13/2021 CLINICAL DATA:  High probability for pulmonary embolism.  Fever. EXAM: CT ANGIOGRAPHY CHEST WITH CONTRAST TECHNIQUE: Multidetector CT imaging of the chest was performed using the standard protocol during bolus administration of intravenous contrast. Multiplanar CT  image reconstructions and MIPs were obtained to evaluate the vascular anatomy. RADIATION DOSE REDUCTION: This exam was performed according to the departmental dose-optimization program which includes automated exposure control, adjustment of the mA and/or kV according to patient size and/or use of iterative reconstruction technique. CONTRAST:  16m OMNIPAQUE IOHEXOL 350 MG/ML SOLN COMPARISON:  CT angiogram chest 06/03/2007 FINDINGS: Cardiovascular: There is adequate opacification of the pulmonary arteries to the segmental level. There is no evidence for pulmonary embolism. The heart is moderately enlarged. There is no pericardial effusion. Left-sided pacemaker is present. Aorta is normal in size. There are atherosclerotic calcifications of the aorta and coronary arteries. Mediastinum/Nodes: There is a small hiatal hernia. Esophagus is within normal limits. Visualized thyroid gland is within normal limits. No enlarged lymph nodes are seen. Lungs/Pleura: Moderate emphysematous changes have progressed. There is mucous plugging and bronchial wall thickening throughout the left lower lobe. There is a new nodular density abutting the right hilum in the posterior right upper lobe image 6/77 measuring 1.8 x 1.9 by 2.0 cm. There is a new band of likely scarring in the right upper lobe. No pleural effusion or pneumothorax. Scarring in both lung apices persists. Upper Abdomen: Right adrenal mass containing some punctate calcifications measuring 4.2 by 2.6 cm appears unchanged from 2009 and is favored as benign given stability. Right renal cyst is partially visualized measuring 16 mm. Musculoskeletal: No chest wall abnormality. No acute or significant osseous findings. Review of the MIP images confirms the above findings. IMPRESSION: 1. No evidence for pulmonary embolism. 2. New 20 mm right solid pulmonary nodule within the upper lobe. Per Fleischner Society Guidelines, consider a non-contrast Chest CT at 3 months, a PET/CT, or  tissue sampling. These guidelines do not apply to immunocompromised patients and patients with cancer. Follow up in patients with significant comorbidities as clinically warranted. For lung cancer screening, adhere to Lung-RADS guidelines. Reference: Radiology. 2017; 284(1):228-43. 3. Left lower lobe peribronchial wall thickening and mucous plugging may related to infection/aspiration. 4. 1.6 cm right Bosniak 1 benign simple cyst. No follow-up imaging is recommended. JACR 2018 Feb; 264-273, Management of the Incidental RenalMass on CT, RadioGraphics 2021; 814-848, Bosniak Classification of Cystic Renal Masses, Version 2019. 5. Cardiomegaly. Aortic Atherosclerosis (ICD10-I70.0) and Emphysema (ICD10-J43.9). Electronically Signed   By: ARonney AstersM.D.   On: 10/13/2021 21:21   DG Chest 2 View  Result Date: 10/13/2021 CLINICAL DATA:  Cough, fever  EXAM: CHEST - 2 VIEW COMPARISON:  10/25/2020 FINDINGS: Cardiac size is within normal limits. Low position of diaphragms suggests COPD. There are new patchy interstitial markings in right upper and left lower lung fields. There are no signs of alveolar pulmonary edema. Pacemaker/defibrillator battery is seen in left infraclavicular region. There is no pleural effusion or pneumothorax. Asymmetric pleural density in the left apex has not changed. IMPRESSION: New small patchy interstitial infiltrates are seen in right upper and left lower lung fields suggesting multifocal atelectasis/pneumonia. Part of this finding may suggest underlying scarring. Electronically Signed   By: Elmer Picker M.D.   On: 10/13/2021 14:09    Microbiology: Results for orders placed or performed during the hospital encounter of 10/13/21  Resp Panel by RT-PCR (Flu A&B, Covid) Anterior Nasal Swab     Status: Abnormal   Collection Time: 10/13/21  3:24 PM   Specimen: Anterior Nasal Swab  Result Value Ref Range Status   SARS Coronavirus 2 by RT PCR POSITIVE (A) NEGATIVE Final    Comment:  (NOTE) SARS-CoV-2 target nucleic acids are DETECTED.  The SARS-CoV-2 RNA is generally detectable in upper respiratory specimens during the acute phase of infection. Positive results are indicative of the presence of the identified virus, but do not rule out bacterial infection or co-infection with other pathogens not detected by the test. Clinical correlation with patient history and other diagnostic information is necessary to determine patient infection status. The expected result is Negative.  Fact Sheet for Patients: EntrepreneurPulse.com.au  Fact Sheet for Healthcare Providers: IncredibleEmployment.be  This test is not yet approved or cleared by the [place] FDA and  has been authorized for detection and/or diagnosis of SARS-CoV-2 by FDA under an Emergency Use Authorization (EUA).  This EUA will remain in effect (meaning this test can be used) for the duration of  the COVID-19 declaration under Section 564(b)(1) of the A ct, 21 U.S.C. section 360bbb-3(b)(1), unless the authorization is terminated or revoked sooner.     Influenza A by PCR NEGATIVE NEGATIVE Final   Influenza B by PCR NEGATIVE NEGATIVE Final    Comment: (NOTE) The Xpert Xpress SARS-CoV-2/FLU/RSV plus assay is intended as an aid in the diagnosis of influenza from Nasopharyngeal swab specimens and should not be used as a sole basis for treatment. Nasal washings and aspirates are unacceptable for Xpert Xpress SARS-CoV-2/FLU/RSV testing.  Fact Sheet for Patients: EntrepreneurPulse.com.au  Fact Sheet for Healthcare Providers: IncredibleEmployment.be  This test is not yet approved or cleared by the [place] FDA and has been authorized for detection and/or diagnosis of SARS-CoV-2 by FDA under an Emergency Use Authorization (EUA). This EUA will remain in effect (meaning this test can be used) for the duration of the COVID-19  declaration under Section 564(b)(1) of the Act, 21 U.S.C. section 360bbb-3(b)(1), unless the authorization is terminated or revoked.  Performed at [facility], [place] [address], [place], [place] [address]     Labs: CBC: Recent Labs  Lab 10/13/21 1819 10/14/21 0550 10/14/21 0611  WBC 8.7 5.3  --   NEUTROABS 6.5 4.8  --   HGB 11.0* 10.0* 12.6*  HCT 33.7* 32.4* 37.0*  MCV 79.9* 81.4  --   PLT 286 279  --    Basic Metabolic Panel: Recent Labs  Lab 10/13/21 1819 10/13/21 2039 10/14/21 0550 10/14/21 0611  NA 139  --  137 139  K 4.2  --  3.7 3.7  CL 101  --  102  --   CO2  28  --  23  --   GLUCOSE 118*  --  218*  --   BUN 9  --  9  --   CREATININE 1.16  --  1.29*  --   CALCIUM 8.6*  --  8.5*  --   MG  --  1.9 1.9  --   PHOS  --   --  2.5  --    Liver Function Tests: Recent Labs  Lab 10/13/21 1819 10/14/21 0550  AST 73* 62*  ALT 67* 61*  ALKPHOS 52 59  BILITOT 0.6 0.3  PROT 7.0 7.0  ALBUMIN 3.2* 3.0*   CBG: No results for input(s): "GLUCAP" in the last 168 hours.  Discharge time spent: greater than 30 minutes.  Signed: Patrecia Pour, MD [facility] 10/14/2021

## 2021-10-14 NOTE — Evaluation (Signed)
Occupational Therapy Evaluation and Discharge Patient Details Name: Benjamin Cook MRN: [id number] DOB: Aug 24, 1945 Today's Date: 10/14/2021   History of Present Illness Benjamin Cook is a 76 y.o. male admitted to [facility] on 10/13/2021 with generalized weakness after presenting from home to [facility] ED and near syncopal episode with blood drawn. Found to be COVID +. PHMx: chronic systolic/diastolic heart failure, status post ICD, paroxysmal atrial fibrillation, COPD, essential hypertension, hyperlipidemia, chronically elevated troponin, anemia of chronic disease associated baseline hemoglobin range of 11-13.   Clinical Impression   This 76 yo male admitted and found to have above presents to acute OT stating he is feeling much better--back to baseline and pt demonstrates independency for basic ADLs and mobility in his hospital room on RA. No further OT needs and no PT needs identified. Acute OT will sign off.      Recommendations for follow up therapy are one component of a multi-disciplinary discharge planning process, led by the attending physician.  Recommendations may be updated based on patient status, additional functional criteria and insurance authorization.   Follow Up Recommendations  No OT follow up    Assistance Recommended at Discharge PRN  Patient can return home with the following Assistance with cooking/housework;Assist for transportation    Functional Status Assessment  Patient has not had a recent decline in their functional status  Equipment Recommendations  None recommended by OT       Precautions / Restrictions Precautions Precautions: None Restrictions Weight Bearing Restrictions: No      Mobility Bed Mobility Overal bed mobility: Independent                  Transfers Overall transfer level: Independent                        Balance Overall balance assessment: No apparent balance deficits (not formally assessed)                                          ADL either performed or assessed with clinical judgement   ADL Overall ADL's : Independent                                             Vision Patient Visual Report: No change from baseline              Pertinent Vitals/Pain Pain Assessment Pain Assessment: No/denies pain     Hand Dominance Right   Extremity/Trunk Assessment Upper Extremity Assessment Upper Extremity Assessment: Overall WFL for tasks assessed           Communication Communication Communication: No difficulties   Cognition Arousal/Alertness: Awake/alert Behavior During Therapy: WFL for tasks assessed/performed Overall Cognitive Status: Within Functional Limits for tasks assessed                                       General Comments  VSS on RA            Home Living Family/patient expects to be discharged to:: Private residence Living Arrangements: Spouse/significant other;Children Available Help at Discharge: Family;Available 24 hours/day Type of Home: House Home Access: Level entry  Home Layout: One level     Bathroom Shower/Tub:  (takes baths in jacuzzi tub--wife helps him in and out)   Biochemist, clinical: Standard     Home Equipment: Conservation officer, nature (2 wheels);Crutches;Cane - single point;Hand held shower head          Prior Functioning/Environment Prior Level of Function : Independent/Modified Independent               ADLs Comments: except A with in and out of jacuzzi tub                 OT Goals(Current goals can be found in the care plan section) Acute Rehab OT Goals Patient Stated Goal: to go home         AM-PAC OT "6 Clicks" Daily Activity     Outcome Measure Help from another person eating meals?: None Help from another person taking care of personal grooming?: None Help from another person toileting, which includes using toliet, bedpan, or urinal?: None Help from  another person bathing (including washing, rinsing, drying)?: None Help from another person to put on and taking off regular upper body clothing?: None Help from another person to put on and taking off regular lower body clothing?: None 6 Click Score: 24   End of Session Nurse Communication:  (pt good to go, no further OT needs and not PT needs identifed)  Activity Tolerance: Patient tolerated treatment well Patient left: in bed  OT Visit Diagnosis: Muscle weakness (generalized) (M62.81)                Time: 6948-5462 OT Time Calculation (min): 13 min Charges:  OT General Charges $OT Visit: 1 Visit OT Evaluation $OT Eval Low Complexity: [place], OTR/L Acute Rehab Services [facility] [phone] Office [phone]    Almon Register 10/14/2021, 10:08 AM

## 2021-10-16 LAB — HCV INTERPRETATION

## 2021-10-17 LAB — HEPATITIS PANEL, ACUTE: Hep A IgM: NEGATIVE — AB

## 2021-10-24 ENCOUNTER — Ambulatory Visit (INDEPENDENT_AMBULATORY_CARE_PROVIDER_SITE_OTHER): Payer: Medicare (Managed Care)

## 2021-10-24 DIAGNOSIS — I255 Ischemic cardiomyopathy: Secondary | ICD-10-CM | POA: Diagnosis not present

## 2021-10-24 LAB — CUP PACEART REMOTE DEVICE CHECK
Battery Remaining Longevity: 23 mo
Battery Remaining Percentage: 23 %
Battery Voltage: 2.8 V
Brady Statistic AP VP Percent: 2.9 %
Brady Statistic AP VS Percent: 24 %
Brady Statistic AS VP Percent: 7.1 %
Brady Statistic AS VS Percent: 65 %
Brady Statistic RA Percent Paced: 25 %
Brady Statistic RV Percent Paced: 10 %
Date Time Interrogation Session: 20230920040029
HighPow Impedance: 61 Ohm
HighPow Impedance: 61 Ohm
Implantable Lead Implant Date: 20120420
Implantable Lead Implant Date: 20180613
Implantable Lead Location: 753859
Implantable Lead Location: 753860
Implantable Pulse Generator Implant Date: 20160212
Lead Channel Impedance Value: 330 Ohm
Lead Channel Impedance Value: 510 Ohm
Lead Channel Pacing Threshold Amplitude: 0.5 V
Lead Channel Pacing Threshold Amplitude: 0.75 V
Lead Channel Pacing Threshold Pulse Width: 0.5 ms
Lead Channel Pacing Threshold Pulse Width: 0.5 ms
Lead Channel Sensing Intrinsic Amplitude: 11.6 mV
Lead Channel Sensing Intrinsic Amplitude: 2.1 mV
Lead Channel Setting Pacing Amplitude: 2 V
Lead Channel Setting Pacing Amplitude: 2.5 V
Lead Channel Setting Pacing Pulse Width: 0.5 ms
Lead Channel Setting Sensing Sensitivity: 0.5 mV
Pulse Gen Serial Number: 7225079

## 2021-11-06 NOTE — Progress Notes (Signed)
Remote ICD transmission.   

## 2021-11-23 ENCOUNTER — Other Ambulatory Visit: Payer: Self-pay | Admitting: Cardiology

## 2021-11-23 ENCOUNTER — Other Ambulatory Visit: Payer: Self-pay | Admitting: Internal Medicine

## 2021-12-05 ENCOUNTER — Telehealth: Payer: Self-pay | Admitting: Cardiology

## 2021-12-05 ENCOUNTER — Other Ambulatory Visit: Payer: Self-pay | Admitting: Cardiology

## 2021-12-05 DIAGNOSIS — I480 Paroxysmal atrial fibrillation: Secondary | ICD-10-CM

## 2021-12-05 NOTE — Telephone Encounter (Signed)
Pt c/o medication issue:  1. Name of Medication:   sacubitril-valsartan (ENTRESTO) 97-103 MG  2. How are you currently taking this medication (dosage and times per day)?   As prescribed  3. Are you having a reaction (difficulty breathing--STAT)?   No  4. What is your medication issue?   Wife stated patient is completely out of this medication and will need assistance getting this medication.  Wife stated she can pick up some samples tomorrow if available.

## 2021-12-05 NOTE — Telephone Encounter (Signed)
Prescription refill request for Xarelto received.  Indication: Afib  Last office visit: 05/25/21 Lovena Le) Weight: 68.3kg Age: 76 Scr: 1.29 (10/14/21)  CrCl: 45.65m/min  Appropriate dose and refill sent to requested pharmacy.

## 2021-12-05 NOTE — Telephone Encounter (Signed)
Called patient spoke with wife, advised that we had patient assistance options (patient wife will come pick up forms and fill them out and give back) I advised I would route to pharmd in regards to samples, I did advise they did not make 91/103 dosage. However, I would see what could be done until we get patient assistance information filled out.   Patient wife verbalized understanding.   Thanks!

## 2021-12-06 NOTE — Telephone Encounter (Signed)
Reached out to social work to see if there were any options but there are not any. If patient's copay is unaffordable, we can switch to a more affordable blood pressure medication while her patient assistance is processing.

## 2021-12-06 NOTE — Telephone Encounter (Signed)
Can anything be done for this patient? We are not provided with Entresto 97-'103mg'$  samples

## 2021-12-07 NOTE — Telephone Encounter (Signed)
Spoke with pt wife, aware samples placed at the front desk for pick up.

## 2021-12-17 ENCOUNTER — Other Ambulatory Visit: Payer: Self-pay | Admitting: Internal Medicine

## 2021-12-18 ENCOUNTER — Other Ambulatory Visit: Payer: Self-pay | Admitting: Internal Medicine

## 2021-12-31 ENCOUNTER — Other Ambulatory Visit: Payer: Self-pay | Admitting: Student

## 2021-12-31 ENCOUNTER — Telehealth: Payer: Self-pay | Admitting: Cardiology

## 2021-12-31 NOTE — Telephone Encounter (Signed)
Spoke with pt's wife regarding samples of entresto. We do not have samples for 97-'103mg'$ . Wife does mention that they submitted for pt assistance and they still have not heard back. Will forward to primary nurse to follow up on this. Wife verbalizes understanding.

## 2021-12-31 NOTE — Telephone Encounter (Signed)
Patient calling the office for samples of medication:   1.  What medication and dosage are you requesting samples for?   sacubitril-valsartan (ENTRESTO) 97-103 MG    2.  Are you currently out of this medication? Yes

## 2022-01-01 NOTE — Telephone Encounter (Signed)
Application has been faxed. Patient made aware.

## 2022-01-04 ENCOUNTER — Encounter: Payer: Self-pay | Admitting: *Deleted

## 2022-01-21 ENCOUNTER — Telehealth: Payer: Self-pay

## 2022-01-21 NOTE — Telephone Encounter (Signed)
Following alert received from [employer] received for Device alert for sustained VT with successful ATP therapy. Event occurred 12/17 @ 09:46, EGM shows onset of sustained VT, rate 164 falling into the VT-1 zone, ATP delivered successfully x1 converting to regular AS/VS.  Patient reports he was bending over tieing his shoes, when he raised up he felt dizzy and had near syncope event. States he took several deep breaths and felt improvement. States after he was able to go to church after. Denies any chest pain, new or worsening shortness of breath (has chronic), palpitations or any other complaints. Reports compliance with meds on file including doses.   Patient was seen in office 05/25/2021 and noted no VT since VT ablation.   Patient does not drive, advised no driving per [place] DMV x6 months. Shock plan reviewed with patient, voiced understanding.

## 2022-01-22 ENCOUNTER — Encounter (HOSPITAL_COMMUNITY): Payer: Self-pay

## 2022-01-22 ENCOUNTER — Other Ambulatory Visit: Payer: Self-pay

## 2022-01-22 ENCOUNTER — Emergency Department (HOSPITAL_COMMUNITY)
Admission: EM | Admit: 2022-01-22 | Discharge: 2022-01-22 | Disposition: A | Discharge status: Home / Self Care | Payer: Medicare (Managed Care) | Attending: Emergency Medicine | Admitting: Emergency Medicine

## 2022-01-22 ENCOUNTER — Emergency Department (HOSPITAL_COMMUNITY): Payer: Medicare (Managed Care)

## 2022-01-22 DIAGNOSIS — I110 Hypertensive heart disease with heart failure: Secondary | ICD-10-CM | POA: Insufficient documentation

## 2022-01-22 DIAGNOSIS — I5042 Chronic combined systolic (congestive) and diastolic (congestive) heart failure: Secondary | ICD-10-CM | POA: Diagnosis not present

## 2022-01-22 DIAGNOSIS — Z7901 Long term (current) use of anticoagulants: Secondary | ICD-10-CM | POA: Insufficient documentation

## 2022-01-22 DIAGNOSIS — I472 Ventricular tachycardia, unspecified: Secondary | ICD-10-CM | POA: Insufficient documentation

## 2022-01-22 DIAGNOSIS — J449 Chronic obstructive pulmonary disease, unspecified: Secondary | ICD-10-CM | POA: Insufficient documentation

## 2022-01-22 DIAGNOSIS — Z79899 Other long term (current) drug therapy: Secondary | ICD-10-CM | POA: Insufficient documentation

## 2022-01-22 DIAGNOSIS — Z95810 Presence of automatic (implantable) cardiac defibrillator: Secondary | ICD-10-CM | POA: Diagnosis not present

## 2022-01-22 DIAGNOSIS — R55 Syncope and collapse: Secondary | ICD-10-CM | POA: Insufficient documentation

## 2022-01-22 DIAGNOSIS — R7401 Elevation of levels of liver transaminase levels: Secondary | ICD-10-CM | POA: Insufficient documentation

## 2022-01-22 DIAGNOSIS — D649 Anemia, unspecified: Secondary | ICD-10-CM | POA: Diagnosis not present

## 2022-01-22 DIAGNOSIS — I951 Orthostatic hypotension: Secondary | ICD-10-CM | POA: Diagnosis not present

## 2022-01-22 DIAGNOSIS — R7989 Other specified abnormal findings of blood chemistry: Secondary | ICD-10-CM | POA: Insufficient documentation

## 2022-01-22 LAB — COMPREHENSIVE METABOLIC PANEL
ALT: 49 U/L — ABNORMAL HIGH (ref 0–44)
AST: 47 U/L — ABNORMAL HIGH (ref 15–41)
Albumin: 2.9 g/dL — ABNORMAL LOW (ref 3.5–5.0)
Alkaline Phosphatase: 63 U/L (ref 38–126)
Anion gap: 8 (ref 5–15)
BUN: 7 mg/dL — ABNORMAL LOW (ref 8–23)
CO2: 25 mmol/L (ref 22–32)
Calcium: 9 mg/dL (ref 8.9–10.3)
Chloride: 103 mmol/L (ref 98–111)
Creatinine, Ser: 0.98 mg/dL (ref 0.61–1.24)
GFR, Estimated: >60 mL/min (ref >60)
Glucose, Bld: 113 mg/dL — ABNORMAL HIGH (ref 70–99)
Potassium: 4 mmol/L (ref 3.5–5.1)
Sodium: 136 mmol/L (ref 135–145)
Total Bilirubin: 0.7 mg/dL (ref 0.3–1.2)
Total Protein: 7.7 g/dL (ref 6.5–8.1)

## 2022-01-22 LAB — CBC WITH DIFFERENTIAL/PLATELET
Abs Immature Granulocytes: 0.02 10*3/uL (ref 0.00–0.07)
Basophils Absolute: 0 10*3/uL (ref 0.0–0.1)
Basophils Relative: 0 %
Eosinophils Absolute: 0.3 10*3/uL (ref 0.0–0.5)
Eosinophils Relative: 4 %
HCT: 39.3 % (ref 39.0–52.0)
Hemoglobin: 12.3 g/dL — ABNORMAL LOW (ref 13.0–17.0)
Immature Granulocytes: 0 %
Lymphocytes Relative: 14 %
Lymphs Abs: 1.1 10*3/uL (ref 0.7–4.0)
MCH: 25 pg — ABNORMAL LOW (ref 26.0–34.0)
MCHC: 31.3 g/dL (ref 30.0–36.0)
MCV: 79.9 fL — ABNORMAL LOW (ref 80.0–100.0)
Monocytes Absolute: 1.2 10*3/uL — ABNORMAL HIGH (ref 0.1–1.0)
Monocytes Relative: 15 %
Neutro Abs: 5.2 10*3/uL (ref 1.7–7.7)
Neutrophils Relative %: 67 %
Platelets: 286 10*3/uL (ref 150–400)
RBC: 4.92 MIL/uL (ref 4.22–5.81)
RDW: 18.5 % — ABNORMAL HIGH (ref 11.5–15.5)
WBC: 7.8 10*3/uL (ref 4.0–10.5)
nRBC: 0 % (ref 0.0–0.2)

## 2022-01-22 LAB — I-STAT VENOUS BLOOD GAS, ED
Acid-Base Excess: 0 mmol/L (ref 0.0–2.0)
Bicarbonate: 23.6 mmol/L (ref 20.0–28.0)
Calcium, Ion: 1.07 mmol/L — ABNORMAL LOW (ref 1.15–1.40)
HCT: 34 % — ABNORMAL LOW (ref 39.0–52.0)
Hemoglobin: 11.6 g/dL — ABNORMAL LOW (ref 13.0–17.0)
O2 Saturation: 96 %
Potassium: 3.8 mmol/L (ref 3.5–5.1)
Sodium: 139 mmol/L (ref 135–145)
TCO2: 25 mmol/L (ref 22–32)
pCO2, Ven: 34.3 mmHg — ABNORMAL LOW (ref 44–60)
pH, Ven: 7.446 — ABNORMAL HIGH (ref 7.25–7.43)
pO2, Ven: 79 mmHg — ABNORMAL HIGH (ref 32–45)

## 2022-01-22 LAB — URINALYSIS, ROUTINE W REFLEX MICROSCOPIC
Bacteria, UA: NONE SEEN
Bilirubin Urine: NEGATIVE
Glucose, UA: NEGATIVE mg/dL
Ketones, ur: NEGATIVE mg/dL
Leukocytes,Ua: NEGATIVE
Nitrite: NEGATIVE
Protein, ur: NEGATIVE mg/dL
Specific Gravity, Urine: 1.006 (ref 1.005–1.030)
pH: 7 (ref 5.0–8.0)

## 2022-01-22 LAB — TROPONIN I (HIGH SENSITIVITY)
Troponin I (High Sensitivity): 19 ng/L — ABNORMAL HIGH (ref <18)
Troponin I (High Sensitivity): 19 ng/L — ABNORMAL HIGH (ref <18)

## 2022-01-22 LAB — MAGNESIUM: Magnesium: 1.9 mg/dL (ref 1.7–2.4)

## 2022-01-22 LAB — BRAIN NATRIURETIC PEPTIDE: B Natriuretic Peptide: 228.2 pg/mL — ABNORMAL HIGH (ref 0.0–100.0)

## 2022-01-22 MED ORDER — AMIODARONE HCL 200 MG PO TABS
100.0000 mg | ORAL_TABLET | Freq: Once | ORAL | Status: AC
  Administered 2022-01-22: 100 mg via ORAL
  Filled 2022-01-22: qty 1

## 2022-01-22 NOTE — ED Provider Notes (Signed)
Cleared by cardiology for DC home.    Valarie Merino, MD 01/22/22 [phone]

## 2022-01-22 NOTE — Discharge Instructions (Addendum)
Make sure to take all of your medications as prescribed.  You are having episodes of V-Tach that do not require shocking, but that is what is causing your episodes of faintness or dizziness. Your defibrillator will shock you if it happens for too long.  If this does happen, call your cardiologist or come back to the ER.  Make sure to make an appointment with your cardiologist regarding your visit to the ER today.

## 2022-01-22 NOTE — ED Notes (Signed)
[employer] pacemaker interrogation report received, PT had a self-corrected round of V-tach today at 647. Caller name Richardson Landry.

## 2022-01-22 NOTE — Consult Note (Addendum)
Cardiology Consultation   Patient ID: DARREK LEASURE MRN: [id number]; DOB: 04-28-1945  Admit date: 01/22/2022 Date of Consult: 01/22/2022  PCP:  Roselee Nova, MD   [facility] Cardiologist:  Kirk Ruths, MD  Electrophysiologist:  Thompson Grayer, MD  Electrophysiology APP:  Patsey Berthold, NP (Inactive)    Patient Profile:   IVO MOGA is a 76 y.o. male with a hx of CAD, ischemic cardiomyopathy, PAF, V-tach s/p ICD, HTN, HLD who is being seen 01/22/2022 for the evaluation of near syncope at the request of Dr. Nechama Guard.  History of Present Illness:   Mr. Landstrom is a 76 year old male with above medical history who is followed by Dr. [name]. Per chart review, patient's cardiac history began in 07/1999. Patient was found to have an abnormal EKG, followed by an abnormal nuclear stress test. Had a cardiac catheterization that showed 95% stenosis in the mid LAD and diffuse 70% stenosis in the RCA. Had PCI of the LAD. EF was 25-30%. ICD implanted for ischemic cardiomyopathy in 2012. More recently, patient had a cardiac catheterization in 05/2014 that showed diffuse nonobstructive CAD with EF 35%. ICD generator was changed out in 2016, new RV lead was placed in 2018. ICD has detected paroxysmal atrial fibrillation in the past.   Patient was admitted to the hospital in 04/2019 after his device shocked him for VT.  Echocardiogram 04/27/2019 showed EF 35%, mild LVH, grade I diastolic dysfunction. Nuclear stress test in 11/2019 showed EF 22%, inferior and apical defect consistent with scar and with minimal peri-infarct ischemia. Admitted again in 05/2020 for ICD shocks and VT storm. Followed by EP and was discharged on amiodarone.   Had recurrent VT in 10/2020 and was admitted to the hospital a third time. Echocardiogram on 10/11/2020 showed EF 30-35%, no regional wall motion abnormalities, mild LVH. LHC on 10/12/2020 showed 75% calcified distal left main, patent mid LAD  stent, 70% mid AV groove circumflex and RCA disease. He was not an operative candidate. Underwent V Tach ablation on 10/26/20. ICD was last interrogated on 10/24/21 and showed normal device function.   Patient presented to the ED on 12/19 complaining that he had sudden onset dizziness and weakness this AM. Thought that he may have been shocked by his ICD. Device was interrogated and showed an 8 second episode of NSVT around 7 AM today, but no shocks administered. Labs in the ED showed Na 136, K 4.0, creatinine 0.98, WBC 7.8, hemoglobin 12.3, platelets 286. hsTn 19>19. BNP 228.2.   CXR showed findings suggestive of COPD. EKG showed atrial paced rhythm with RBBB present.   On interview, patient reports that he was getting ready for church on Sunday when he had an episode of dizziness.  Patient had an event over to tie his shoes and when he stood up, he felt dizzy and lightheaded.  Denied chest pain, palpitations during the episode.  Dizziness improved after he sat down and rested for a few moments.  He denied having any ICD shocks. he was able to go to church and go back to stay as usual.  Today, patient was lying down and sat up very quickly.  When he sat up, he again felt dizzy and lightheaded.  Again, denied having any chest pain, palpitation.  Did not receive a shock from his ICD.  Patient reports that he has shortness of breath on exertion at baseline, this has been stable and has not been worsening over the past several  weeks.  He also has had a decreased appetite recently.  Wife reports that he "does not eat enough to live on".  Patient does drink coffee and soda, but does not drink much water.  Past Medical History:  Diagnosis Date   Adrenal mass ([place])    per pt this is remote (10 years) and benign by biopsy   AICD (automatic cardioverter/defibrillator) present    Anxiety    Arthritis    CAD (coronary artery disease)    a. s/p PCI to LAD 2001 b. myoview 2014 high risk with scar LAD/RCA territory  but no ischemia   Cardiomyopathy, ischemic    CHF (congestive heart failure) ([name])    class II/III   COPD (chronic obstructive pulmonary disease) ([place])    smokes cigars but has quit cigarettes   Defibrillator discharge 04/21/2019   Diverticulitis    Dyspnea    Flu 03/2015   HTN (hypertension)    Hyperlipidemia    NSVT (nonsustained ventricular tachycardia) ([place]) 03/05/2017   Paroxysmal atrial fibrillation ([place]) 03/2015   chads2vasc score is 4   PSA (psoriatic arthritis) ([name])    increased   PVD (peripheral vascular disease) ([place])     Past Surgical History:  Procedure Laterality Date   ABDOMINAL AORTIC ENDOVASCULAR STENT GRAFT N/A 03/03/2017   Procedure: ABDOMINAL AORTIC ENDOVASCULAR STENT GRAFT;  Surgeon: Waynetta Sandy, MD;  Location: [facility];  Service: Vascular;  Laterality: N/A;   cataract surgery     CORONARY ANGIOGRAPHY N/A 10/12/2020   Procedure: CORONARY ANGIOGRAPHY;  Surgeon: Lorretta Harp, MD;  Location: [facility];  Service: Cardiovascular;  Laterality: N/A;   CORONARY ANGIOPLASTY WITH STENT PLACEMENT  2001   a. PCI to LAD   IMPLANTABLE CARDIOVERTER DEFIBRILLATOR (ICD) GENERATOR CHANGE N/A 03/18/2014   a. SJM Fortify ST DR ICD implanted by [facility] for primary prevention b. gen change 03/2014    LEAD REVISION/REPAIR N/A 07/17/2016   Procedure: Lead Revision/Repair;  Surgeon: Evans Lance, MD;  Location: [facility];  Service: Cardiovascular;  Laterality: N/A;   LEFT HEART CATHETERIZATION WITH CORONARY ANGIOGRAM N/A 05/27/2014   Procedure: LEFT HEART CATHETERIZATION WITH CORONARY ANGIOGRAM;  Surgeon: Sherren Mocha, MD;  Location: [facility];  Service: Cardiovascular;  Laterality: N/A;   REPAIR KNEE LIGAMENT     V TACH ABLATION N/A 10/26/2020   Procedure: V TACH ABLATION;  Surgeon: Evans Lance, MD;  Location: [facility];  Service: Cardiovascular;  Laterality: N/A;     Home Medications:  Prior to Admission medications   Medication Sig Start  Date End Date Taking? Authorizing Provider  XARELTO 15 MG TABS tablet TAKE 1 TABLET BY MOUTH EVERY DAY WITH SUPPER 12/05/21   Evans Lance, MD  acetaminophen (TYLENOL) 500 MG tablet Take 500-1,000 mg by mouth every 8 (eight) hours as needed (for pain).     [provider]  amiodarone (PACERONE) 100 MG tablet Take 1 tablet (100 mg total) by mouth daily. 01/24/21   Shirley Friar, PA-C  atorvastatin (LIPITOR) 80 MG tablet TAKE 1 TABLET BY MOUTH DAILY AT 6 PM 11/23/21   Lelon Perla, MD  carvedilol (COREG) 6.25 MG tablet TAKE 1 TABLET BY MOUTH TWICE A DAY WITH MEALS 12/31/21   Evans Lance, MD  empagliflozin (JARDIANCE) 10 MG TABS tablet Take 1 tablet (10 mg total) by mouth daily. Patient not taking: Reported on 10/14/2021 10/17/20   Charlynne Cousins, MD  feeding supplement (ENSURE ENLIVE /  ENSURE PLUS) LIQD Try to drink 1 can 1-2 times a day. 06/04/20   Richardson Dopp T, PA-C  Fluticasone-Salmeterol (ADVAIR) 100-50 MCG/DOSE AEPB Inhale 1 puff into the lungs 2 (two) times daily. Patient taking differently: Inhale 2 puffs into the lungs 2 (two) times daily. 01/07/20   Shirley Friar, PA-C  furosemide (LASIX) 40 MG tablet TAKE 1/2 TABLET BY MOUTH DAILY Patient taking differently: Take 20 mg by mouth daily. 05/25/21   Evans Lance, MD  isosorbide mononitrate (IMDUR) 30 MG 24 hr tablet TAKE 1/2 OF A TABLET (15 MG TOTAL) BY MOUTH DAILY 12/18/21   Lelon Perla, MD  melatonin 5 MG TABS Take 1 tablet (5 mg total) by mouth at bedtime. 10/16/20   Charlynne Cousins, MD  mometasone-formoterol (DULERA) 100-5 MCG/ACT AERO Inhale 2 puffs into the lungs in the morning and at bedtime. Patient not taking: Reported on 10/14/2021 07/12/20   Thompson Grayer, MD  Multiple Vitamin (MULTIVITAMIN) capsule Take 1 capsule by mouth daily.    [provider]  nitroGLYCERIN (NITROSTAT) 0.4 MG SL tablet Place 1 tablet (0.4 mg total) under the tongue every 5 (five) minutes x 3 doses as  needed for chest pain. 06/04/20 06/04/21  Richardson Dopp T, PA-C  pantoprazole (PROTONIX) 40 MG tablet Take 1 tablet (40 mg total) by mouth daily. 10/16/20 10/16/21  Charlynne Cousins, MD  potassium chloride SA (KLOR-CON M20) 20 MEQ tablet Take 1 tablet (20 mEq total) by mouth daily. Please schedule appointment for additional refills. 11/23/21   Lelon Perla, MD  sacubitril-valsartan (ENTRESTO) 97-103 MG TAKE 1 TABLET BY MOUTH TWICE A DAY Patient taking differently: Take 1 tablet by mouth 2 (two) times daily. 04/11/21   Lelon Perla, MD    Inpatient Medications: Scheduled Meds:  Continuous Infusions:  PRN Meds:   Allergies:   No Known Allergies  Social History:   Social History   Socioeconomic History   Marital status: Married    Spouse name: Not on file   Number of children: Not on file   Years of education: Not on file   Highest education level: Not on file  Occupational History   Occupation: Lawn care  Tobacco Use   Smoking status: Every Day    Types: Cigars   Smokeless tobacco: Never   Tobacco comments:    smokes cigars now, no longer smokes cigarettes but previously smoked 2ppd  Vaping Use   Vaping Use: Never used  Substance and Sexual Activity   Alcohol use: No   Drug use: No   Sexual activity: Not Currently    Partners: Female    Birth control/protection: None  Other Topics Concern   Not on file  Social History Narrative   Not on file   Social Determinants of Health   Financial Resource Strain: Not on file  Food Insecurity: Not on file  Transportation Needs: Not on file  Physical Activity: Not on file  Stress: Not on file  Social Connections: Not on file  Intimate Partner Violence: Not on file    Family History:    Family History  Problem Relation Age of Onset   Cirrhosis Mother        due to ETOH   Cancer Neg Hx      ROS:  Please see the history of present illness.   All other ROS reviewed and negative.     Physical Exam/Data:    Vitals:   01/22/22 1100 01/22/22 1115 01/22/22 1215 01/22/22 1430  BP: (!) 148/78 122/88 (!) 140/67 139/72  Pulse: 65 66 65 71  Resp: (!) 24 (!) 24 (!) 28 (!) 26  Temp: 97.9 F (36.6 C)     TempSrc:      SpO2: 96% 95% 99% 96%  Weight:      Height:       No intake or output data in the 24 hours ending 01/22/22 1633    01/22/2022    8:42 AM 05/25/2021    9:35 AM 01/24/2021   11:31 AM  Last 3 Weights  Weight (lbs) 147 lb 150 lb 9.6 oz 146 lb  Weight (kg) 66.679 kg 68.312 kg 66.225 kg     Body mass index is 20.5 kg/m.  General:  Well nourished, well developed, in no acute distress. Laying flat in the bed HEENT: normal Neck: no JVD Vascular: No carotid bruits; Radial pulses 2+ bilaterally  Cardiac:  normal S1, S2; RRR; no murmur  Lungs:  expiratory wheezing heard throughout. Normal WOB on room air  Abd: soft, nontender, no hepatomegaly  Ext: no edema Musculoskeletal:  No deformities, BUE and BLE strength normal and equal Skin: warm and dry  Neuro:  CNs 2-12 intact, no focal abnormalities noted Psych:  Normal affect   EKG:  The EKG was personally reviewed and demonstrates:  sinus rhythm with RBBB present.  Telemetry:  Telemetry was personally reviewed and demonstrates:  predominantly NSR, occasional PVCs, occasional pacing   Relevant CV Studies:   Laboratory Data:  High Sensitivity Troponin:   Recent Labs  Lab 01/22/22 0829 01/22/22 1019  TROPONINIHS 19* 19*     Chemistry Recent Labs  Lab 01/22/22 0829 01/22/22 1040  NA 136 139  K 4.0 3.8  CL 103  --   CO2 25  --   GLUCOSE 113*  --   BUN 7*  --   CREATININE 0.98  --   CALCIUM 9.0  --   MG 1.9  --   GFRNONAA >60  --   ANIONGAP 8  --     Recent Labs  Lab 01/22/22 0829  PROT 7.7  ALBUMIN 2.9*  AST 47*  ALT 49*  ALKPHOS 63  BILITOT 0.7   Lipids No results for input(s): "CHOL", "TRIG", "HDL", "LABVLDL", "LDLCALC", "CHOLHDL" in the last 168 hours.  Hematology Recent Labs  Lab 01/22/22 0829  01/22/22 1040  WBC 7.8  --   RBC 4.92  --   HGB 12.3* 11.6*  HCT 39.3 34.0*  MCV 79.9*  --   MCH 25.0*  --   MCHC 31.3  --   RDW 18.5*  --   PLT 286  --    Thyroid No results for input(s): "TSH", "FREET4" in the last 168 hours.  BNP Recent Labs  Lab 01/22/22 0833  BNP 228.2*    DDimer No results for input(s): "DDIMER" in the last 168 hours.   Radiology/Studies:  DG Chest 2 View  Result Date: 01/22/2022 CLINICAL DATA:  SOB chronic cough EXAM: CHEST - 2 VIEW COMPARISON:  10/13/2021. FINDINGS: The heart size and mediastinal contours are within normal limits. Lungs are hyperinflated suggesting COPD. There is no focal consolidation. No pneumothorax or pleural effusion. Central pulmonary artery prominence noted suggestive of pulmonary arterial hypertension. Aorta is calcified. There is a left-sided pacemaker. IMPRESSION: Findings suggest COPD. Possible pulmonary arterial hypertension. Otherwise no active cardiopulmonary disease. Electronically Signed   By: Sammie Bench M.D.   On: 01/22/2022 09:25     Assessment and Plan:   V-Tach  S/p ICD - Device was interrogated and showed an 8 second episode of NSVT around 7 AM today, but no shocks administered.  - Today, patient's episode of dizziness/lightheadedness was associated with position change (laying to sitting quickly). Unlikely that his symptoms were associated with 8 second episode of NSVT - Continue amiodarone 100 mg daily and carvedilol 6.25 mg BID   Near Syncope, Dizziness - Patient presented complaining of an episode of dizziness that occurred after he went from laying down to sitting upright. Had an episode on Sunday that occurred when he stood up quickly after bending over - Reports that he has had a decreased appetite recently and has not been eating enough. He also reports drinking coffee and soda, but not much water - Encouraged patient to drink 40 ounces of water daily. Also encouraged him to make sure he is getting  adequate nutrition  - Orthostatic vital signs negative in the ED. However, given his poor PO intake and treatment with both jardiance and lasix, I am concerned that he is dehydrated and this is contributing to his dizziness.  - Stop lasix   CAD  - Patient denies chest pain or worsening of DOE  - Continue lipitor 80 mg daily, carvedilol 6.25 mg BID, imdur 30 mg daily. Not on ASA as patient is no xarelto   PAF  - Patient maintaining NSR on telemetry  - Continue xarelto, carvedilol   Chronic HFrEF  Ischemic cardiomyopathy  - Patient is euvolemic on exam. Denies orthopnea, ankle edema, DOE  - Continue jardiance, carvedilol, entresto      Risk Assessment/Risk Scores:      New York Heart Association (NYHA) Functional Class NYHA Class II  CHA2DS2-VASc Score = 6   This indicates a 9.7% annual risk of stroke. The patient's score is based upon: CHF History: 1 HTN History: 1 Diabetes History: 1 Stroke History: 0 Vascular Disease History: 1 Age Score: 2 Gender Score: 0      For questions or updates, please contact [facility] Please consult [email or web address] for contact info under    Signed, Margie Billet, PA-C  01/22/2022 4:33 PM   Personally seen and examined. Agree with above.  77 year old with extensive cardiac history including prior VT storm status post ablation who came in with orthostasis.  When bending over and standing up he felt lightheaded.  Felt it again and ended up coming into the hospital for further evaluation.  He did have on interrogation of his device and 8 beat run of nonsustained VT earlier today but this was asymptomatic.  He is on carvedilol.  On exam appears euvolemic.  Orthostatic hypotension - Symptoms are classic for orthostasis. - He admits that he has not eaten or drunk as much as he should the last couple days. - We will go ahead and stop his 20 mg of Lasix daily.  He is taking high-dose Entresto as well as Jardiance.  Spoke to  his wife.  Understands instructions.  I am comfortable with discharge home.  Candee Furbish, MD

## 2022-01-22 NOTE — ED Triage Notes (Signed)
Coming from home, woke this morning, suddenly after felt dizziness and weakness. Third episode this week. For past two  were brief(10-60mn) episodes he received a call after his defibrillator fired without him noticed; today he felt the same way. EMS was called today after 437m without symptoms relief. On arrival [facility] confused about time.

## 2022-01-22 NOTE — ED Provider Notes (Signed)
[facility] EMERGENCY DEPARTMENT Provider Note   CSN: [id number] Arrival date & time: 01/22/22  0355     History  Chief Complaint  Patient presents with   Weakness    ACDI fired x3     Benjamin Cook is a 76 y.o. male. With pmh chronic systolic/diastolic heart failure, VT s/p ablation and ICD, paroxysmal Afib on Xarelto, COPD, HTN ,HLD who presents after 3 episodes of presyncope and generalized fatigue reporting that the first episode he was called and told that his defibrillator went off.  He said episode started this past Sunday he was at church when he had bent over to tie his shoe and then he began to feel lightheaded like he was going to pass out and generally unwell and sat down for a while and eventually the episode passed while his wife and him off.  He had been called later that night after the event and told that during this time his defibrillator had went off however he did not feel any palpitations or chest pain when this happened.  He said he had another episode yesterday morning but cannot remember exactly what happened and then today he had gotten up in the morning to get a cup of coffee and water and was feeling unwell and sat in bed feeling like he was going to pass out and eventually the episode passed.  He never fainted with any of these episodes.  He notes generalized decreased p.o. intake for ongoing for many years and intermittent vomiting also chronic.  No recent fevers or illness or medication changes.  Denies any chest pain or abdominal pain.  This is the first time his defibrillator has gone off since when he had it placed about a year ago.  He also endorses dyspnea on exertion after about 100 feet but no orthopnea or PND or dyspnea at rest. He endorses no weight gain and is compliant with home meds.  Of note, he did get his COVID-vaccine the Thursday prior to all this happening.   Weakness      Home Medications Prior to Admission medications    Medication Sig Start Date End Date Taking? Authorizing Provider  XARELTO 15 MG TABS tablet TAKE 1 TABLET BY MOUTH EVERY DAY WITH SUPPER 12/05/21   Evans Lance, MD  acetaminophen (TYLENOL) 500 MG tablet Take 500-1,000 mg by mouth every 8 (eight) hours as needed (for pain).     [provider]  amiodarone (PACERONE) 100 MG tablet Take 1 tablet (100 mg total) by mouth daily. 01/24/21   Shirley Friar, PA-C  atorvastatin (LIPITOR) 80 MG tablet TAKE 1 TABLET BY MOUTH DAILY AT 6 PM 11/23/21   Lelon Perla, MD  carvedilol (COREG) 6.25 MG tablet TAKE 1 TABLET BY MOUTH TWICE A DAY WITH MEALS 12/31/21   Evans Lance, MD  empagliflozin (JARDIANCE) 10 MG TABS tablet Take 1 tablet (10 mg total) by mouth daily. Patient not taking: Reported on 10/14/2021 10/17/20   Charlynne Cousins, MD  feeding supplement (ENSURE ENLIVE / ENSURE PLUS) LIQD Try to drink 1 can 1-2 times a day. 06/04/20   Richardson Dopp T, PA-C  Fluticasone-Salmeterol (ADVAIR) 100-50 MCG/DOSE AEPB Inhale 1 puff into the lungs 2 (two) times daily. Patient taking differently: Inhale 2 puffs into the lungs 2 (two) times daily. 01/07/20   Shirley Friar, PA-C  furosemide (LASIX) 40 MG tablet TAKE 1/2 TABLET BY MOUTH DAILY Patient taking differently: Take 20 mg by mouth  daily. 05/25/21   Evans Lance, MD  isosorbide mononitrate (IMDUR) 30 MG 24 hr tablet TAKE 1/2 OF A TABLET (15 MG TOTAL) BY MOUTH DAILY 12/18/21   Lelon Perla, MD  melatonin 5 MG TABS Take 1 tablet (5 mg total) by mouth at bedtime. 10/16/20   Charlynne Cousins, MD  mometasone-formoterol (DULERA) 100-5 MCG/ACT AERO Inhale 2 puffs into the lungs in the morning and at bedtime. Patient not taking: Reported on 10/14/2021 07/12/20   Thompson Grayer, MD  Multiple Vitamin (MULTIVITAMIN) capsule Take 1 capsule by mouth daily.    [provider]  nitroGLYCERIN (NITROSTAT) 0.4 MG SL tablet Place 1 tablet (0.4 mg total) under the tongue every 5  (five) minutes x 3 doses as needed for chest pain. 06/04/20 06/04/21  Richardson Dopp T, PA-C  pantoprazole (PROTONIX) 40 MG tablet Take 1 tablet (40 mg total) by mouth daily. 10/16/20 10/16/21  Charlynne Cousins, MD  potassium chloride SA (KLOR-CON M20) 20 MEQ tablet Take 1 tablet (20 mEq total) by mouth daily. Please schedule appointment for additional refills. 11/23/21   Lelon Perla, MD  sacubitril-valsartan (ENTRESTO) 97-103 MG TAKE 1 TABLET BY MOUTH TWICE A DAY Patient taking differently: Take 1 tablet by mouth 2 (two) times daily. 04/11/21   Lelon Perla, MD      Allergies    Patient has no known allergies.    Review of Systems   Review of Systems  Neurological:  Positive for weakness.    Physical Exam Updated Vital Signs BP 139/72   Pulse 71   Temp 97.9 F (36.6 C)   Resp (!) 26   Ht '5\' 11"'$  (1.803 m)   Wt 66.7 kg   SpO2 96%   BMI 20.50 kg/m  Physical Exam Constitutional: Alert and oriented.  No acute distress Eyes: Conjunctivae are normal. ENT      Head: Normocephalic and atraumatic.      Neck: No stridor. Cardiovascular: S1, S2,  regular rate, equal radial pulses Respiratory: Normal respiratory effort. Breath sounds are normal. Gastrointestinal: Soft and nontender.  Musculoskeletal: Normal range of motion in all extremities. No pitting edema of lower extremities Neurologic: Normal speech and language.  No facial droop.  Moving all extremities equally.  Sensation grossly intact.  No gross focal neurologic deficits are appreciated. Skin: Skin is warm, dry and intact. No rash noted. Psychiatric: Mood and affect are normal. Speech and behavior are normal.  ED Results / Procedures / Treatments   Labs (all labs ordered are listed, but only abnormal results are displayed) Labs Reviewed  CBC WITH DIFFERENTIAL/PLATELET - Abnormal; Notable for the following components:      Result Value   Hemoglobin 12.3 (*)    MCV 79.9 (*)    MCH 25.0 (*)    RDW 18.5 (*)     Monocytes Absolute 1.2 (*)    All other components within normal limits  COMPREHENSIVE METABOLIC PANEL - Abnormal; Notable for the following components:   Glucose, Bld 113 (*)    BUN 7 (*)    Albumin 2.9 (*)    AST 47 (*)    ALT 49 (*)    All other components within normal limits  BRAIN NATRIURETIC PEPTIDE - Abnormal; Notable for the following components:   B Natriuretic Peptide 228.2 (*)    All other components within normal limits  URINALYSIS, ROUTINE W REFLEX MICROSCOPIC - Abnormal; Notable for the following components:   Color, Urine STRAW (*)    Hgb  urine dipstick SMALL (*)    All other components within normal limits  I-STAT VENOUS BLOOD GAS, ED - Abnormal; Notable for the following components:   pH, Ven 7.446 (*)    pCO2, Ven 34.3 (*)    pO2, Ven 79 (*)    Calcium, Ion 1.07 (*)    HCT 34.0 (*)    Hemoglobin 11.6 (*)    All other components within normal limits  TROPONIN I (HIGH SENSITIVITY) - Abnormal; Notable for the following components:   Troponin I (High Sensitivity) 19 (*)    All other components within normal limits  TROPONIN I (HIGH SENSITIVITY) - Abnormal; Notable for the following components:   Troponin I (High Sensitivity) 19 (*)    All other components within normal limits  MAGNESIUM    EKG EKG Interpretation  Date/Time:  Tuesday January 22 2022 08:31:56 EST Ventricular Rate:  60 PR Interval:  173 QRS Duration: 168 QT Interval:  467 QTC Calculation: 467 R Axis:   -75 Text Interpretation: Sinus rhythm Right bundle branch block Inferior infarct, old No significant change since last tracing Confirmed by Georgina Snell [phone]) on 01/22/2022 8:32:47 AM  Radiology DG Chest 2 View  Result Date: 01/22/2022 CLINICAL DATA:  SOB chronic cough EXAM: CHEST - 2 VIEW COMPARISON:  10/13/2021. FINDINGS: The heart size and mediastinal contours are within normal limits. Lungs are hyperinflated suggesting COPD. There is no focal consolidation. No pneumothorax or  pleural effusion. Central pulmonary artery prominence noted suggestive of pulmonary arterial hypertension. Aorta is calcified. There is a left-sided pacemaker. IMPRESSION: Findings suggest COPD. Possible pulmonary arterial hypertension. Otherwise no active cardiopulmonary disease. Electronically Signed   By: Sammie Bench M.D.   On: 01/22/2022 09:25    Procedures Procedures  Patient remained on constant cardiac monitoring, sinus rhythm with intermittent PVCs. Medications Ordered in ED Medications  amiodarone (PACERONE) tablet 100 mg (100 mg Oral Given 01/22/22 1017)    ED Course/ Medical Decision Making/ A&P Clinical Course as of 01/22/22 1527  Tue Jan 22, 2022  0945 Spoke to on-call cardiology team cards Master Trish who will send a cardiology team member to come down to evaluate the patient for concerns of possible recurrent VTE.  Interrogation of patient's ICD still pending. [VB]  5188 Labs reviewed.  BNP 228 slightly elevated from prior but no pitting edema or significant fluid overload on exam, no pleural effusions on chest x-ray.  Troponin 19 at baseline.  Stable chronic anemia hemoglobin 12.3.  Mild transaminitis downtrended from baseline AST 47 ALT 49.  Potassium 4.0 within normal limits magnesium 1.9 within normal limits. [VB]  4166 Signed out to dr Francia Greaves pending cardiology evaluation and recommendations for episodes of nonsustained VT. [VB]    Clinical Course User Index [VB] Elgie Congo, MD                           Medical Decision Making VOLNEY REIERSON is a 76 y.o. male. With pmh chronic systolic/diastolic heart failure, VT s/p ablation and ICD, paroxysmal Afib on Xarelto, COPD, HTN ,HLD who presents after 3 episodes of presyncope and generalized fatigue reporting that the first episode he was called and told that his defibrillator went off.   Initial EKG obtained with sinus rhythm with right bundle branch block and nonspecific ST/T changes but no acute changes  since prior tracing.  With no chest pain, no acute ST/T changes, troponins reassuring initial 19 and repeat 19, less likely ACS.  Based on patient's history and presentation multiple underlying pathologies for patient's presyncopal episodes including but not limited to arrhythmia, atypical ACS, dehydration, acute electrolyte abnormalities among multiple other etiologies.  Based on patient's generally normal neurologic exam and no focal deficits, unlikely to be CVA or neurologically related.  Labs reviewed.  BNP 228 slightly elevated from prior but no pitting edema or significant fluid overload on exam, no pleural effusions on chest x-ray.  Troponin 19 at baseline, repeat 19.  Stable chronic anemia hemoglobin 12.3.  Mild transaminitis downtrended from baseline AST 47 ALT 49.  Potassium 4.0 within normal limits magnesium 1.9 within normal limits.  [employer] pacemaker interrogated.  He has had 2 episodes of VT both nonsustained last happened this morning at 6:47 AM consistent with the episode of presyncope another episode 12/17 at 9:46 AM.  Likely underlying cause for episodes is arrhythmia and underlying episodes of VT that correspond with times of these episodes.  Cardiology team was consulted. Signed out to dr Francia Greaves pending cardiology evaluation and recommendations for episodes of nonsustained VT    Amount and/or Complexity of Data Reviewed Labs: ordered. Radiology: ordered.  Risk Prescription drug management.    Final Clinical Impression(s) / ED Diagnoses Final diagnoses:  Near syncope  V-tach [facility])    Rx / DC Orders ED Discharge Orders     None         Elgie Congo, MD 01/22/22 1527

## 2022-01-23 ENCOUNTER — Ambulatory Visit (INDEPENDENT_AMBULATORY_CARE_PROVIDER_SITE_OTHER): Payer: Medicare (Managed Care)

## 2022-01-23 DIAGNOSIS — I255 Ischemic cardiomyopathy: Secondary | ICD-10-CM | POA: Diagnosis not present

## 2022-01-24 LAB — CUP PACEART REMOTE DEVICE CHECK
Battery Remaining Longevity: 25 mo
Battery Remaining Percentage: 25 %
Battery Voltage: 2.81 V
Brady Statistic AP VP Percent: 2.2 %
Brady Statistic AP VS Percent: 21 %
Brady Statistic AS VP Percent: 5.3 %
Brady Statistic AS VS Percent: 70 %
Brady Statistic RA Percent Paced: 22 %
Brady Statistic RV Percent Paced: 7.5 %
Date Time Interrogation Session: 20231220020022
HighPow Impedance: 65 Ohm
HighPow Impedance: 65 Ohm
Implantable Lead Connection Status: 753985
Implantable Lead Connection Status: 753985
Implantable Lead Implant Date: 20120420
Implantable Lead Implant Date: 20180613
Implantable Lead Location: 753859
Implantable Lead Location: 753860
Implantable Pulse Generator Implant Date: 20160212
Lead Channel Impedance Value: 330 Ohm
Lead Channel Impedance Value: 560 Ohm
Lead Channel Pacing Threshold Amplitude: 0.5 V
Lead Channel Pacing Threshold Amplitude: 0.75 V
Lead Channel Pacing Threshold Pulse Width: 0.5 ms
Lead Channel Pacing Threshold Pulse Width: 0.5 ms
Lead Channel Sensing Intrinsic Amplitude: 11.6 mV
Lead Channel Sensing Intrinsic Amplitude: 2 mV
Lead Channel Setting Pacing Amplitude: 2 V
Lead Channel Setting Pacing Amplitude: 2.5 V
Lead Channel Setting Pacing Pulse Width: 0.5 ms
Lead Channel Setting Sensing Sensitivity: 0.5 mV
Pulse Gen Serial Number: 7225079

## 2022-01-31 ENCOUNTER — Other Ambulatory Visit: Payer: Self-pay | Admitting: Internal Medicine

## 2022-02-05 ENCOUNTER — Other Ambulatory Visit: Payer: Self-pay | Admitting: Internal Medicine

## 2022-02-08 ENCOUNTER — Encounter: Payer: Self-pay | Admitting: Internal Medicine

## 2022-02-08 ENCOUNTER — Ambulatory Visit: Payer: Medicare HMO | Attending: Internal Medicine | Admitting: Internal Medicine

## 2022-02-08 VITALS — BP 148/80 | HR 72 | Ht 71.0 in | Wt 147.0 lb

## 2022-02-08 DIAGNOSIS — I5042 Chronic combined systolic (congestive) and diastolic (congestive) heart failure: Secondary | ICD-10-CM | POA: Diagnosis not present

## 2022-02-08 DIAGNOSIS — I472 Ventricular tachycardia, unspecified: Secondary | ICD-10-CM

## 2022-02-08 DIAGNOSIS — I255 Ischemic cardiomyopathy: Secondary | ICD-10-CM | POA: Diagnosis not present

## 2022-02-08 DIAGNOSIS — Z95810 Presence of automatic (implantable) cardiac defibrillator: Secondary | ICD-10-CM | POA: Diagnosis not present

## 2022-02-08 LAB — CUP PACEART INCLINIC DEVICE CHECK
Battery Remaining Longevity: 27 mo
Brady Statistic RA Percent Paced: 21 %
Brady Statistic RV Percent Paced: 7.2 %
Date Time Interrogation Session: 20240105113550
HighPow Impedance: 66.375
Implantable Lead Connection Status: 753985
Implantable Lead Connection Status: 753985
Implantable Lead Implant Date: 20120420
Implantable Lead Implant Date: 20180613
Implantable Lead Location: 753859
Implantable Lead Location: 753860
Implantable Pulse Generator Implant Date: 20160212
Lead Channel Impedance Value: 337.5 Ohm
Lead Channel Impedance Value: 662.5 Ohm
Lead Channel Pacing Threshold Amplitude: 0.5 V
Lead Channel Pacing Threshold Amplitude: 0.5 V
Lead Channel Pacing Threshold Amplitude: 0.75 V
Lead Channel Pacing Threshold Amplitude: 0.75 V
Lead Channel Pacing Threshold Pulse Width: 0.5 ms
Lead Channel Pacing Threshold Pulse Width: 0.5 ms
Lead Channel Pacing Threshold Pulse Width: 0.5 ms
Lead Channel Pacing Threshold Pulse Width: 0.5 ms
Lead Channel Sensing Intrinsic Amplitude: 1.8 mV
Lead Channel Sensing Intrinsic Amplitude: 11.6 mV
Lead Channel Setting Pacing Amplitude: 2 V
Lead Channel Setting Pacing Amplitude: 2.5 V
Lead Channel Setting Pacing Pulse Width: 0.5 ms
Lead Channel Setting Sensing Sensitivity: 0.5 mV
Pulse Gen Serial Number: 7225079

## 2022-02-08 LAB — SEDIMENTATION RATE: Sed Rate: 93 mm/hr — ABNORMAL HIGH (ref 0–30)

## 2022-02-08 NOTE — Progress Notes (Signed)
HPI Benjamin Cook returns today for followup. He is a pleasant 77 yo man who has been followed by Dr. Greggory Brandy in the past. He was referred to me in the fall for incessant VT and underwent VT ablation which has been very successful with no recurrent VT. He is on only low dose amiodarone. He feels well though he has been sedentary. No chest pain or sob. No ICD therapies.  No Known Allergies   Current Outpatient Medications  Medication Sig Dispense Refill   acetaminophen (TYLENOL) 500 MG tablet Take 500-1,000 mg by mouth every 8 (eight) hours as needed (for pain).      amiodarone (PACERONE) 100 MG tablet Take 1 tablet (100 mg total) by mouth daily. 90 tablet 3   atorvastatin (LIPITOR) 80 MG tablet TAKE 1 TABLET BY MOUTH DAILY AT 6 PM 90 tablet 3   carvedilol (COREG) 6.25 MG tablet TAKE 1 TABLET BY MOUTH TWICE A DAY WITH MEALS 180 tablet 2   feeding supplement (ENSURE ENLIVE / ENSURE PLUS) LIQD Try to drink 1 can 1-2 times a day.     Fluticasone-Salmeterol (ADVAIR) 100-50 MCG/DOSE AEPB Inhale 1 puff into the lungs 2 (two) times daily. (Patient taking differently: Inhale 2 puffs into the lungs 2 (two) times daily.) 1 each 0   isosorbide mononitrate (IMDUR) 30 MG 24 hr tablet TAKE 1/2 OF A TABLET (15 MG TOTAL) BY MOUTH DAILY 45 tablet 3   melatonin 5 MG TABS Take 1 tablet (5 mg total) by mouth at bedtime. 30 tablet 0   mometasone-formoterol (DULERA) 100-5 MCG/ACT AERO Inhale 2 puffs into the lungs in the morning and at bedtime. 8.8 each 1   Multiple Vitamin (MULTIVITAMIN) capsule Take 1 capsule by mouth daily.     potassium chloride SA (KLOR-CON M20) 20 MEQ tablet Take 1 tablet (20 mEq total) by mouth daily. Please schedule appointment for additional refills. 90 tablet 1   sacubitril-valsartan (ENTRESTO) 97-103 MG TAKE 1 TABLET BY MOUTH TWICE A DAY (Patient taking differently: Take 1 tablet by mouth 2 (two) times daily.) 180 tablet 1   XARELTO 15 MG TABS tablet TAKE 1 TABLET BY MOUTH EVERY DAY WITH  SUPPER 30 tablet 5   nitroGLYCERIN (NITROSTAT) 0.4 MG SL tablet Place 1 tablet (0.4 mg total) under the tongue every 5 (five) minutes x 3 doses as needed for chest pain. 25 tablet 12   pantoprazole (PROTONIX) 40 MG tablet Take 1 tablet (40 mg total) by mouth daily. 30 tablet 1   No current facility-administered medications for this visit.     Past Medical History:  Diagnosis Date   Adrenal mass ([place])    per pt this is remote (10 years) and benign by biopsy   AICD (automatic cardioverter/defibrillator) present    Anxiety    Arthritis    CAD (coronary artery disease)    a. s/p PCI to LAD 2001 b. myoview 2014 high risk with scar LAD/RCA territory but no ischemia   Cardiomyopathy, ischemic    CHF (congestive heart failure) ([name])    class II/III   COPD (chronic obstructive pulmonary disease) ([place])    smokes cigars but has quit cigarettes   Defibrillator discharge 04/21/2019   Diverticulitis    Dyspnea    Flu 03/2015   HTN (hypertension)    Hyperlipidemia    NSVT (nonsustained ventricular tachycardia) ([place]) 03/05/2017   Paroxysmal atrial fibrillation ([place]) 03/2015   chads2vasc score is 4   PSA (psoriatic arthritis) ([place])  increased   PVD (peripheral vascular disease) ([place])     ROS:   All systems reviewed and negative except as noted in the HPI.   Past Surgical History:  Procedure Laterality Date   ABDOMINAL AORTIC ENDOVASCULAR STENT GRAFT N/A 03/03/2017   Procedure: ABDOMINAL AORTIC ENDOVASCULAR STENT GRAFT;  Surgeon: Waynetta Sandy, MD;  Location: [facility];  Service: Vascular;  Laterality: N/A;   cataract surgery     CORONARY ANGIOGRAPHY N/A 10/12/2020   Procedure: CORONARY ANGIOGRAPHY;  Surgeon: Lorretta Harp, MD;  Location: [place] [facility];  Service: Cardiovascular;  Laterality: N/A;   CORONARY ANGIOPLASTY WITH STENT PLACEMENT  2001   a. PCI to LAD   IMPLANTABLE CARDIOVERTER DEFIBRILLATOR (ICD) GENERATOR CHANGE N/A 03/18/2014   a. SJM Fortify ST DR ICD  implanted by [facility] for primary prevention b. gen change 03/2014    LEAD REVISION/REPAIR N/A 07/17/2016   Procedure: Lead Revision/Repair;  Surgeon: Evans Lance, MD;  Location: [place] [facility];  Service: Cardiovascular;  Laterality: N/A;   LEFT HEART CATHETERIZATION WITH CORONARY ANGIOGRAM N/A 05/27/2014   Procedure: LEFT HEART CATHETERIZATION WITH CORONARY ANGIOGRAM;  Surgeon: Sherren Mocha, MD;  Location: [facility] CATH LAB;  Service: Cardiovascular;  Laterality: N/A;   REPAIR KNEE LIGAMENT     V TACH ABLATION N/A 10/26/2020   Procedure: V TACH ABLATION;  Surgeon: Evans Lance, MD;  Location: [place] [facility];  Service: Cardiovascular;  Laterality: N/A;     Family History  Problem Relation Age of Onset   Cirrhosis Mother        due to ETOH   Cancer Neg Hx      Social History   Socioeconomic History   Marital status: Married    Spouse name: Not on file   Number of children: Not on file   Years of education: Not on file   Highest education level: Not on file  Occupational History   Occupation: Lawn care  Tobacco Use   Smoking status: Every Day    Types: Cigars   Smokeless tobacco: Never   Tobacco comments:    smokes cigars now, no longer smokes cigarettes but previously smoked 2ppd  Vaping Use   Vaping Use: Never used  Substance and Sexual Activity   Alcohol use: No   Drug use: No   Sexual activity: Not Currently    Partners: Female    Birth control/protection: None  Other Topics Concern   Not on file  Social History Narrative   Not on file   Social Determinants of Health   Financial Resource Strain: Not on file  Food Insecurity: Not on file  Transportation Needs: Not on file  Physical Activity: Not on file  Stress: Not on file  Social Connections: Not on file  Intimate Partner Violence: Not on file     BP (!) 148/80   Pulse 72   Ht '5\' 11"'$  (1.803 m)   Wt 147 lb (66.7 kg)   SpO2 97%   BMI 20.50 kg/m   Physical Exam:  Well appearing NAD HEENT:  Unremarkable Neck:  No JVD, no thyromegally Lymphatics:  No adenopathy Back:  No CVA tenderness Lungs:  Clear HEART:  Regular rate rhythm, no murmurs, no rubs, no clicks Abd:  soft, positive bowel sounds, no organomegally, no rebound, no guarding Ext:  2 plus pulses, no edema, no cyanosis, no clubbing Skin:  No rashes no nodules Neuro:  CN II through XII intact, motor grossly intact  EKG  DEVICE  Normal device function.  See PaceArt for details.   Assess/Plan:  VT - he has had a single episode of VT since his VT ablation which was terminated with ATP. Continue low dose amio and watchful waiting. Chronic systolic heart failure - his symptoms are class 2. No change in his meds. ICM - he denies anginal symptoms. PAF - he is maintaining NSR. Continue xarelto.   Carleene Overlie Anieya Helman,MD

## 2022-02-08 NOTE — Patient Instructions (Addendum)
Medication Instructions:  Your physician recommends that you continue on your current medications as directed. Please refer to the Current Medication list given to you today.  *If you need a refill on your cardiac medications before your next appointment, please call your pharmacy*  Lab Work: You will have blood drawn today:  Sed Rate.   Testing/Procedures: None ordered.  Follow-Up: At [facility], you and your health needs are our priority.  As part of our continuing mission to provide you with exceptional heart care, we have created designated Provider Care Teams.  These Care Teams include your primary Cardiologist (physician) and Advanced Practice Providers (APPs -  Physician Assistants and Nurse Practitioners) who all work together to provide you with the care you need, when you need it.  We recommend signing up for the patient portal called "MyChart".  Sign up information is provided on this After Visit Summary.  MyChart is used to connect with patients for Virtual Visits (Telemedicine).  Patients are able to view lab/test results, encounter notes, upcoming appointments, etc.  Non-urgent messages can be sent to your provider as well.   To learn more about what you can do with MyChart, go to [email or web address].    Your next appointment:   1 year(s)  The format for your next appointment:   In Person  Provider:   Cristopher Peru, MD{or one of the following Advanced Practice Providers on your designated Care Team:   Tommye Standard, [place] Legrand Como "Jonni Sanger" Chalmers Cater, [place]  Remote monitoring is used to monitor your ICD from home. This monitoring reduces the number of office visits required to check your device to one time per year. It allows [place] to keep an eye on the functioning of your device to ensure it is working properly. You are scheduled for a device check from home on 04/24/22. You may send your transmission at any time that day. If you have a wireless device, the transmission will be  sent automatically. After your physician reviews your transmission, you will receive a postcard with your next transmission date.  Important Information About Sugar

## 2022-02-11 ENCOUNTER — Telehealth: Payer: Self-pay

## 2022-02-11 DIAGNOSIS — R700 Elevated erythrocyte sedimentation rate: Secondary | ICD-10-CM

## 2022-02-11 NOTE — Telephone Encounter (Signed)
His ESR is high. Repeat in 3 months. If still high he will need to stop the amiodarone. GT   Sed Rate 93 mm/hr.  Normal range 0-30 mm/hr.   Called Ms. Cislo pt spouse.  Made aware of Sed Rate being elevated, educated, and will return to [facility] for redraw on 04/14/2021.  Pt spouse understood, and will have labs drawn on 3/11.

## 2022-02-13 ENCOUNTER — Other Ambulatory Visit: Payer: Self-pay | Admitting: Internal Medicine

## 2022-02-18 NOTE — Progress Notes (Signed)
Remote ICD transmission.   

## 2022-02-26 ENCOUNTER — Telehealth: Payer: Self-pay

## 2022-02-26 DIAGNOSIS — I472 Ventricular tachycardia, unspecified: Secondary | ICD-10-CM

## 2022-02-26 MED ORDER — AMIODARONE HCL 200 MG PO TABS: ORAL_TABLET | ORAL | 3 refills | Status: DC

## 2022-02-26 NOTE — Telephone Encounter (Signed)
Alert received from [employer]:  Merlin alert for ICD shock. Presenting rhythm ApVs. Episode occurred on 02/25/22 @ 1120, VT (avg rate 164bpm) ATP (-), VT rate avg 200bpm, ATPx2 (-), VT rate 243bpm (VF zone, ICD 25J shock, AsVs. OAC- Xarelto, on Amiodarone.   Outreach made to Pt.  Pt was not home.  Spoke with wife per DPR.  Per wife Pt was aware of shock.  States "we were waiting for you to call us".  Advised Dr. Lovena Le had reviewed transmission and Pt should increase amiodarone to 200 mg PO BID for 1 month, then reduce to monthly.  Will continue to follow remotes.  Advised per [place] law Pt should not drive for 6 months post treatment from ICD.  Wife indicates understanding.  Has number to device clinic to call to report future shocks.  Per Dr. Elodia Florence TSH, BMP and magnesium to be done at same time as recheck ESR.

## 2022-04-15 ENCOUNTER — Ambulatory Visit: Payer: Medicare HMO | Attending: Internal Medicine

## 2022-04-15 DIAGNOSIS — I472 Ventricular tachycardia, unspecified: Secondary | ICD-10-CM

## 2022-04-15 DIAGNOSIS — R700 Elevated erythrocyte sedimentation rate: Secondary | ICD-10-CM

## 2022-04-16 LAB — BASIC METABOLIC PANEL
BUN/Creatinine Ratio: 8 — ABNORMAL LOW (ref 10–24)
BUN: 7 mg/dL — ABNORMAL LOW (ref 8–27)
CO2: 22 mmol/L (ref 20–29)
Calcium: 8.4 mg/dL — ABNORMAL LOW (ref 8.6–10.2)
Chloride: 105 mmol/L (ref 96–106)
Creatinine, Ser: 0.89 mg/dL (ref 0.76–1.27)
Glucose: 96 mg/dL (ref 70–99)
Potassium: 4.2 mmol/L (ref 3.5–5.2)
Sodium: 138 mmol/L (ref 134–144)
eGFR: 88 mL/min/{1.73_m2} (ref >59)

## 2022-04-16 LAB — TSH: TSH: 0.052 u[IU]/mL — ABNORMAL LOW (ref 0.450–4.500)

## 2022-04-16 LAB — SEDIMENTATION RATE: Sed Rate: 82 mm/hr — ABNORMAL HIGH (ref 0–30)

## 2022-04-16 LAB — MAGNESIUM: Magnesium: 1.8 mg/dL (ref 1.6–2.3)

## 2022-04-17 ENCOUNTER — Telehealth: Payer: Self-pay

## 2022-04-17 NOTE — Telephone Encounter (Signed)
-----   Message from Evans Lance, MD sent at 04/16/2022  6:19 PM EDT ----- Stop amiodarone.

## 2022-04-17 NOTE — Telephone Encounter (Signed)
Pt spouse and Pt called per Dr. Tanna Furry review of Sed rate, and order to Stop / discontinue taking Amiodarone.   Pt and spouse were made aware, and both parties understood / agreed to stop Amiodarone per Dr. Tanna Furry request.

## 2022-05-07 ENCOUNTER — Telehealth: Payer: Self-pay | Admitting: Cardiology

## 2022-05-07 NOTE — Telephone Encounter (Signed)
Pt c/o medication issue:  1. Name of Medication:   XARELTO 15 MG TABS tablet   2. How are you currently taking this medication (dosage and times per day)? As  prescribed  3. Are you having a reaction (difficulty breathing--STAT)?   4. What is your medication issue?   Wife stated patient is almost out of this medication and wants to get some samples until his medication comes in.

## 2022-05-08 ENCOUNTER — Other Ambulatory Visit: Payer: Self-pay

## 2022-05-08 DIAGNOSIS — I480 Paroxysmal atrial fibrillation: Secondary | ICD-10-CM

## 2022-05-08 MED ORDER — RIVAROXABAN 20 MG PO TABS: 20.0000 mg | ORAL_TABLET | Freq: Every day | ORAL | 3 refills | Status: DC

## 2022-05-08 MED ORDER — RIVAROXABAN 20 MG PO TABS: 20.0000 mg | ORAL_TABLET | Freq: Every day | ORAL | 0 refills | Status: DC

## 2022-05-08 NOTE — Telephone Encounter (Signed)
Called patient wife advised that we were waiting for pharmacy team to give approval for the samples- per protocol. They verbalized understanding, thankful for call back.

## 2022-05-08 NOTE — Telephone Encounter (Signed)
Called patients wife, advised of the changes to medication. Placed samples for pick up, patient wife aware-   RX sent to pharmacy via [facility].  Thanks!

## 2022-05-08 NOTE — Telephone Encounter (Signed)
Prescription refill request for Xarelto received.  Indication:  AFib Last office visit: 1/24 - Dr. Lovena Le Weight: 66.7 kg Age: 77 Scr: 0.89 CrCl:  27  Was previously dropped to 15 mg because of lower weight and higher SCr.  Now that those have normalized, he needs to increase back to 20 mg dose.  Ok to give samples, but please change to 20 mg and explain to patient

## 2022-05-08 NOTE — Telephone Encounter (Signed)
Pt's wife is calling to get update today on samples. Please advise.

## 2022-05-18 ENCOUNTER — Other Ambulatory Visit: Payer: Self-pay | Admitting: Cardiology

## 2022-05-21 ENCOUNTER — Other Ambulatory Visit: Payer: Self-pay | Admitting: Cardiology

## 2022-06-02 ENCOUNTER — Encounter (HOSPITAL_COMMUNITY): Payer: Self-pay

## 2022-06-02 ENCOUNTER — Emergency Department (HOSPITAL_COMMUNITY): Payer: Medicare HMO

## 2022-06-02 ENCOUNTER — Other Ambulatory Visit: Payer: Self-pay

## 2022-06-02 ENCOUNTER — Inpatient Hospital Stay (HOSPITAL_COMMUNITY)
Admission: EM | Admit: 2022-06-02 | Discharge: 2022-06-08 | DRG: 308 | Disposition: A | Discharge status: Home Health | Payer: Medicare HMO | Attending: Family Medicine | Admitting: Family Medicine

## 2022-06-02 DIAGNOSIS — I5023 Acute on chronic systolic (congestive) heart failure: Secondary | ICD-10-CM | POA: Diagnosis present

## 2022-06-02 DIAGNOSIS — I5020 Unspecified systolic (congestive) heart failure: Secondary | ICD-10-CM | POA: Diagnosis present

## 2022-06-02 DIAGNOSIS — Z7951 Long term (current) use of inhaled steroids: Secondary | ICD-10-CM

## 2022-06-02 DIAGNOSIS — I480 Paroxysmal atrial fibrillation: Secondary | ICD-10-CM | POA: Diagnosis present

## 2022-06-02 DIAGNOSIS — Z95810 Presence of automatic (implantable) cardiac defibrillator: Secondary | ICD-10-CM | POA: Diagnosis present

## 2022-06-02 DIAGNOSIS — I2510 Atherosclerotic heart disease of native coronary artery without angina pectoris: Secondary | ICD-10-CM | POA: Diagnosis present

## 2022-06-02 DIAGNOSIS — I110 Hypertensive heart disease with heart failure: Secondary | ICD-10-CM | POA: Diagnosis present

## 2022-06-02 DIAGNOSIS — Z1152 Encounter for screening for COVID-19: Secondary | ICD-10-CM

## 2022-06-02 DIAGNOSIS — F419 Anxiety disorder, unspecified: Secondary | ICD-10-CM | POA: Diagnosis present

## 2022-06-02 DIAGNOSIS — L4050 Arthropathic psoriasis, unspecified: Secondary | ICD-10-CM | POA: Diagnosis present

## 2022-06-02 DIAGNOSIS — I471 Supraventricular tachycardia, unspecified: Secondary | ICD-10-CM | POA: Diagnosis present

## 2022-06-02 DIAGNOSIS — E785 Hyperlipidemia, unspecified: Secondary | ICD-10-CM | POA: Diagnosis present

## 2022-06-02 DIAGNOSIS — I714 Abdominal aortic aneurysm, without rupture, unspecified: Secondary | ICD-10-CM | POA: Diagnosis present

## 2022-06-02 DIAGNOSIS — Z66 Do not resuscitate: Secondary | ICD-10-CM | POA: Diagnosis present

## 2022-06-02 DIAGNOSIS — R911 Solitary pulmonary nodule: Secondary | ICD-10-CM | POA: Diagnosis present

## 2022-06-02 DIAGNOSIS — I472 Ventricular tachycardia, unspecified: Secondary | ICD-10-CM | POA: Diagnosis not present

## 2022-06-02 DIAGNOSIS — D509 Iron deficiency anemia, unspecified: Secondary | ICD-10-CM | POA: Diagnosis present

## 2022-06-02 DIAGNOSIS — E279 Disorder of adrenal gland, unspecified: Secondary | ICD-10-CM | POA: Diagnosis present

## 2022-06-02 DIAGNOSIS — I739 Peripheral vascular disease, unspecified: Secondary | ICD-10-CM | POA: Diagnosis present

## 2022-06-02 DIAGNOSIS — Z8679 Personal history of other diseases of the circulatory system: Secondary | ICD-10-CM

## 2022-06-02 DIAGNOSIS — Z6827 Body mass index (BMI) 27.0-27.9, adult: Secondary | ICD-10-CM

## 2022-06-02 DIAGNOSIS — F17290 Nicotine dependence, other tobacco product, uncomplicated: Secondary | ICD-10-CM | POA: Diagnosis present

## 2022-06-02 DIAGNOSIS — R918 Other nonspecific abnormal finding of lung field: Secondary | ICD-10-CM | POA: Diagnosis present

## 2022-06-02 DIAGNOSIS — I255 Ischemic cardiomyopathy: Secondary | ICD-10-CM | POA: Diagnosis present

## 2022-06-02 DIAGNOSIS — I10 Essential (primary) hypertension: Secondary | ICD-10-CM | POA: Diagnosis present

## 2022-06-02 DIAGNOSIS — D500 Iron deficiency anemia secondary to blood loss (chronic): Secondary | ICD-10-CM | POA: Diagnosis present

## 2022-06-02 DIAGNOSIS — I5022 Chronic systolic (congestive) heart failure: Secondary | ICD-10-CM | POA: Diagnosis present

## 2022-06-02 DIAGNOSIS — M1990 Unspecified osteoarthritis, unspecified site: Secondary | ICD-10-CM | POA: Diagnosis present

## 2022-06-02 DIAGNOSIS — J449 Chronic obstructive pulmonary disease, unspecified: Secondary | ICD-10-CM | POA: Diagnosis present

## 2022-06-02 DIAGNOSIS — I891 Lymphangitis: Secondary | ICD-10-CM | POA: Insufficient documentation

## 2022-06-02 DIAGNOSIS — Z955 Presence of coronary angioplasty implant and graft: Secondary | ICD-10-CM

## 2022-06-02 DIAGNOSIS — Z7901 Long term (current) use of anticoagulants: Secondary | ICD-10-CM

## 2022-06-02 DIAGNOSIS — R42 Dizziness and giddiness: Secondary | ICD-10-CM | POA: Diagnosis not present

## 2022-06-02 DIAGNOSIS — Z79899 Other long term (current) drug therapy: Secondary | ICD-10-CM

## 2022-06-02 DIAGNOSIS — R008 Other abnormalities of heart beat: Secondary | ICD-10-CM | POA: Diagnosis present

## 2022-06-02 DIAGNOSIS — I493 Ventricular premature depolarization: Secondary | ICD-10-CM | POA: Diagnosis present

## 2022-06-02 DIAGNOSIS — E43 Unspecified severe protein-calorie malnutrition: Secondary | ICD-10-CM | POA: Diagnosis present

## 2022-06-02 DIAGNOSIS — C3490 Malignant neoplasm of unspecified part of unspecified bronchus or lung: Secondary | ICD-10-CM | POA: Diagnosis present

## 2022-06-02 DIAGNOSIS — Z539 Procedure and treatment not carried out, unspecified reason: Secondary | ICD-10-CM | POA: Diagnosis not present

## 2022-06-02 LAB — TROPONIN I (HIGH SENSITIVITY): Troponin I (High Sensitivity): 19 ng/L — ABNORMAL HIGH (ref <18)

## 2022-06-02 LAB — BASIC METABOLIC PANEL
Anion gap: 9 (ref 5–15)
BUN: 7 mg/dL — ABNORMAL LOW (ref 8–23)
CO2: 23 mmol/L (ref 22–32)
Calcium: 8.1 mg/dL — ABNORMAL LOW (ref 8.9–10.3)
Chloride: 105 mmol/L (ref 98–111)
Creatinine, Ser: 0.97 mg/dL (ref 0.61–1.24)
GFR, Estimated: >60 mL/min (ref >60)
Glucose, Bld: 100 mg/dL — ABNORMAL HIGH (ref 70–99)
Potassium: 4 mmol/L (ref 3.5–5.1)
Sodium: 137 mmol/L (ref 135–145)

## 2022-06-02 LAB — CBC
HCT: 30.5 % — ABNORMAL LOW (ref 39.0–52.0)
Hemoglobin: 9.8 g/dL — ABNORMAL LOW (ref 13.0–17.0)
MCH: 25.1 pg — ABNORMAL LOW (ref 26.0–34.0)
MCHC: 32.1 g/dL (ref 30.0–36.0)
MCV: 78 fL — ABNORMAL LOW (ref 80.0–100.0)
Platelets: 297 10*3/uL (ref 150–400)
RBC: 3.91 MIL/uL — ABNORMAL LOW (ref 4.22–5.81)
RDW: 19.1 % — ABNORMAL HIGH (ref 11.5–15.5)
WBC: 6.8 10*3/uL (ref 4.0–10.5)
nRBC: 0 % (ref 0.0–0.2)

## 2022-06-02 NOTE — ED Provider Notes (Signed)
[facility] Emergency Department Provider Note MRN:  [id number]  Arrival date & time: 06/03/22     Chief Complaint   Chest Pain (Pt arrived via ems from home c/o dizziness x 2 days and new cp in bigeminy hr 38)   History of Present Illness   Benjamin Cook is a 77 y.o. year-old male presents to the ED with chief complaint of dizziness.  States that over the past few days he has been having intermittent episodes of dizziness/lightheadedness, in which he says he feels like he is going to pass out.  He states that the symptoms come when he feels "excited."  He denies chest pain.  Denies any firing of his AICD.  Denies recent illness.  Anticoagulated on Xarelto for a-fib. No longer talking amiodarone.  History provided by patient.   Review of Systems  Pertinent positive and negative review of systems noted in HPI.    Physical Exam   Vitals:   06/03/22 0515 06/03/22 0530  BP: 113/63 (!) 143/58  Pulse: (!) 37 (!) 37  Resp: (!) 26 17  Temp:    SpO2: 100% 99%    CONSTITUTIONAL:  non toxic-appearing, NAD NEURO:  Alert and oriented x 3, CN 3-12 grossly intact EYES:  eyes equal and reactive ENT/NECK:  Supple, no stridor  CARDIO:  normal rate on my exam, irregular rhythm, appears well-perfused  PULM:  No respiratory distress, CTAB GI/GU:  non-distended,  MSK/SPINE:  No gross deformities, no edema, moves all extremities  SKIN:  no rash, atraumatic   *Additional and/or pertinent findings included in MDM below  Diagnostic and Interventional Summary    EKG Interpretation  Date/Time:  Sunday June 02 2022 23:05:08 EDT Ventricular Rate:  110 PR Interval:  186 QRS Duration: 176 QT Interval:  451 QTC Calculation: 480 R Axis:   -77 Text Interpretation: Sinus tachycardia Paired ventricular premature complexes Right bundle branch block Confirmed by Drema Pry [phone]) on 06/03/2022 2:24:50 AM       Labs Reviewed  BASIC METABOLIC PANEL - Abnormal;  Notable for the following components:      Result Value   Glucose, Bld 100 (*)    BUN 7 (*)    Calcium 8.1 (*)    All other components within normal limits  CBC - Abnormal; Notable for the following components:   RBC 3.91 (*)    Hemoglobin 9.8 (*)    HCT 30.5 (*)    MCV 78.0 (*)    MCH 25.1 (*)    RDW 19.1 (*)    All other components within normal limits  TROPONIN I (HIGH SENSITIVITY) - Abnormal; Notable for the following components:   Troponin I (High Sensitivity) 19 (*)    All other components within normal limits  LIPOPROTEIN A (LPA)  COMPREHENSIVE METABOLIC PANEL  CBC WITH DIFFERENTIAL/PLATELET  POC OCCULT BLOOD, ED  TROPONIN I (HIGH SENSITIVITY)    CT Chest W Contrast  Final Result    DG Chest 2 View  Final Result      Medications  amiodarone (NEXTERONE) 1.8 mg/mL load via infusion 150 mg (150 mg Intravenous Bolus from Bag 06/03/22 0418)    Followed by  amiodarone (NEXTERONE PREMIX) 360-4.14 MG/200ML-% (1.8 mg/mL) IV infusion (60 mg/hr Intravenous New Bag/Given 06/03/22 0418)    Followed by  amiodarone (NEXTERONE PREMIX) 360-4.14 MG/200ML-% (1.8 mg/mL) IV infusion (has no administration in time range)  atorvastatin (LIPITOR) tablet 80 mg (has no administration in time range)  carvedilol (COREG) tablet 6.25  mg (has no administration in time range)  isosorbide mononitrate (IMDUR) 24 hr tablet 15 mg (has no administration in time range)  mometasone-formoterol (DULERA) 100-5 MCG/ACT inhaler 2 puff (has no administration in time range)  sacubitril-valsartan (ENTRESTO) 97-103 mg per tablet (has no administration in time range)  nitroGLYCERIN (NITROSTAT) SL tablet 0.4 mg (has no administration in time range)  acetaminophen (TYLENOL) tablet 650 mg (has no administration in time range)  ondansetron (ZOFRAN) injection 4 mg (has no administration in time range)  iohexol (OMNIPAQUE) 350 MG/ML injection 50 mL (50 mLs Intravenous Contrast Given 06/03/22 0102)     Procedures  /   Critical Care .Critical Care  Performed by: Roxy Horseman, PA-C Authorized by: Roxy Horseman, PA-C   Critical care provider statement:    Critical care time (minutes):  35   Critical care was necessary to treat or prevent imminent or life-threatening deterioration of the following conditions:  Circulatory failure   Critical care was time spent personally by me on the following activities:  Development of treatment plan with patient or surrogate, discussions with consultants, evaluation of patient's response to treatment, examination of patient, ordering and review of laboratory studies, ordering and review of radiographic studies, ordering and performing treatments and interventions, pulse oximetry, re-evaluation of patient's condition and review of old charts   ED Course and Medical Decision Making  I have reviewed the triage vital signs, the nursing notes, and pertinent available records from the EMR.  Social Determinants Affecting Complexity of Care: Patient has no clinically significant social determinants affecting this chief complaint..   ED Course:    Medical Decision Making Patient here after having multiple episodes of dizziness. He states that it feels like he is going to pass out.  Runs of V-tach on interrogation of pacemaker.  Discussed with Dr. Eudelia Bunch.  Will consult cardiology.  Amount and/or Complexity of Data Reviewed Labs: ordered.    Details: Trop 19->17 No leukocytosis HGB 9.8, decreased from most recent POC occult negative Radiology: ordered.    Details: Patient has known chest mass and has declined workup for this.  Risk Prescription drug management. Decision regarding hospitalization.     Consultants: I consulted with cardiology, Dr. Jayme Cloud, who recommends amio infusion.  Appreciate him for admitting.  Appreciate Dr. Imogene Burn for consulting.   Treatment and Plan: Patient's exam and diagnostic results are concerning for v-tach.  Feel that patient  will need admission to the hospital for further treatment and evaluation.  Patient discussed with attending physician, Dr. Eudelia Bunch, who agrees with plan.  Final Clinical Impressions(s) / ED Diagnoses     ICD-10-CM   1. Ventricular tachyarrhythmia [facility])  I47.20       ED Discharge Orders     None         Discharge Instructions Discussed with and Provided to Patient:   Discharge Instructions   None      Jarelle, Ates, PA-C 06/03/22 0539    Nira Conn, MD 06/03/22 [phone]

## 2022-06-03 ENCOUNTER — Ambulatory Visit (INDEPENDENT_AMBULATORY_CARE_PROVIDER_SITE_OTHER): Payer: Medicare HMO

## 2022-06-03 ENCOUNTER — Emergency Department (HOSPITAL_COMMUNITY): Payer: Medicare HMO

## 2022-06-03 DIAGNOSIS — I110 Hypertensive heart disease with heart failure: Secondary | ICD-10-CM | POA: Diagnosis present

## 2022-06-03 DIAGNOSIS — I891 Lymphangitis: Secondary | ICD-10-CM | POA: Diagnosis present

## 2022-06-03 DIAGNOSIS — I255 Ischemic cardiomyopathy: Secondary | ICD-10-CM | POA: Diagnosis present

## 2022-06-03 DIAGNOSIS — I480 Paroxysmal atrial fibrillation: Secondary | ICD-10-CM

## 2022-06-03 DIAGNOSIS — I472 Ventricular tachycardia, unspecified: Secondary | ICD-10-CM

## 2022-06-03 DIAGNOSIS — R55 Syncope and collapse: Secondary | ICD-10-CM | POA: Diagnosis not present

## 2022-06-03 DIAGNOSIS — L4050 Arthropathic psoriasis, unspecified: Secondary | ICD-10-CM | POA: Diagnosis present

## 2022-06-03 DIAGNOSIS — I5022 Chronic systolic (congestive) heart failure: Secondary | ICD-10-CM | POA: Diagnosis present

## 2022-06-03 DIAGNOSIS — Z95810 Presence of automatic (implantable) cardiac defibrillator: Secondary | ICD-10-CM | POA: Diagnosis not present

## 2022-06-03 DIAGNOSIS — I471 Supraventricular tachycardia, unspecified: Secondary | ICD-10-CM | POA: Diagnosis present

## 2022-06-03 DIAGNOSIS — I493 Ventricular premature depolarization: Secondary | ICD-10-CM | POA: Diagnosis present

## 2022-06-03 DIAGNOSIS — Z79899 Other long term (current) drug therapy: Secondary | ICD-10-CM | POA: Diagnosis not present

## 2022-06-03 DIAGNOSIS — I714 Abdominal aortic aneurysm, without rupture, unspecified: Secondary | ICD-10-CM | POA: Diagnosis present

## 2022-06-03 DIAGNOSIS — D500 Iron deficiency anemia secondary to blood loss (chronic): Secondary | ICD-10-CM | POA: Diagnosis present

## 2022-06-03 DIAGNOSIS — I10 Essential (primary) hypertension: Secondary | ICD-10-CM | POA: Diagnosis not present

## 2022-06-03 DIAGNOSIS — E279 Disorder of adrenal gland, unspecified: Secondary | ICD-10-CM | POA: Diagnosis present

## 2022-06-03 DIAGNOSIS — Z1152 Encounter for screening for COVID-19: Secondary | ICD-10-CM | POA: Diagnosis not present

## 2022-06-03 DIAGNOSIS — R42 Dizziness and giddiness: Secondary | ICD-10-CM | POA: Diagnosis present

## 2022-06-03 DIAGNOSIS — I2510 Atherosclerotic heart disease of native coronary artery without angina pectoris: Secondary | ICD-10-CM | POA: Diagnosis present

## 2022-06-03 DIAGNOSIS — R911 Solitary pulmonary nodule: Secondary | ICD-10-CM | POA: Diagnosis present

## 2022-06-03 DIAGNOSIS — R918 Other nonspecific abnormal finding of lung field: Secondary | ICD-10-CM | POA: Diagnosis not present

## 2022-06-03 DIAGNOSIS — I5020 Unspecified systolic (congestive) heart failure: Secondary | ICD-10-CM

## 2022-06-03 DIAGNOSIS — Z955 Presence of coronary angioplasty implant and graft: Secondary | ICD-10-CM | POA: Diagnosis not present

## 2022-06-03 DIAGNOSIS — E785 Hyperlipidemia, unspecified: Secondary | ICD-10-CM | POA: Diagnosis present

## 2022-06-03 DIAGNOSIS — F17290 Nicotine dependence, other tobacco product, uncomplicated: Secondary | ICD-10-CM | POA: Diagnosis present

## 2022-06-03 DIAGNOSIS — J449 Chronic obstructive pulmonary disease, unspecified: Secondary | ICD-10-CM | POA: Diagnosis present

## 2022-06-03 DIAGNOSIS — Z66 Do not resuscitate: Secondary | ICD-10-CM | POA: Diagnosis present

## 2022-06-03 DIAGNOSIS — I739 Peripheral vascular disease, unspecified: Secondary | ICD-10-CM | POA: Diagnosis present

## 2022-06-03 DIAGNOSIS — E43 Unspecified severe protein-calorie malnutrition: Secondary | ICD-10-CM | POA: Diagnosis present

## 2022-06-03 LAB — CUP PACEART REMOTE DEVICE CHECK
Battery Remaining Longevity: 20 mo
Battery Remaining Percentage: 20 %
Battery Voltage: 2.77 V
Brady Statistic AP VP Percent: 1.1 %
Brady Statistic AP VS Percent: 14 %
Brady Statistic AS VP Percent: 2 %
Brady Statistic AS VS Percent: 82 %
Brady Statistic RA Percent Paced: 14 %
Brady Statistic RV Percent Paced: 3 %
Date Time Interrogation Session: 20240429001309
HighPow Impedance: 56 Ohm
HighPow Impedance: 56 Ohm
Implantable Lead Connection Status: 753985
Implantable Lead Connection Status: 753985
Implantable Lead Implant Date: 20120420
Implantable Lead Implant Date: 20180613
Implantable Lead Location: 753859
Implantable Lead Location: 753860
Implantable Pulse Generator Implant Date: 20160212
Lead Channel Impedance Value: 340 Ohm
Lead Channel Impedance Value: 530 Ohm
Lead Channel Pacing Threshold Amplitude: 0.5 V
Lead Channel Pacing Threshold Amplitude: 0.75 V
Lead Channel Pacing Threshold Pulse Width: 0.5 ms
Lead Channel Pacing Threshold Pulse Width: 0.5 ms
Lead Channel Sensing Intrinsic Amplitude: 11.6 mV
Lead Channel Sensing Intrinsic Amplitude: 2.3 mV
Lead Channel Setting Pacing Amplitude: 2 V
Lead Channel Setting Pacing Amplitude: 2.5 V
Lead Channel Setting Pacing Pulse Width: 0.5 ms
Lead Channel Setting Sensing Sensitivity: 0.5 mV
Pulse Gen Serial Number: 7225079

## 2022-06-03 LAB — COMPREHENSIVE METABOLIC PANEL
ALT: 14 U/L (ref 0–44)
AST: 20 U/L (ref 15–41)
Albumin: 2.2 g/dL — ABNORMAL LOW (ref 3.5–5.0)
Alkaline Phosphatase: 51 U/L (ref 38–126)
Anion gap: 10 (ref 5–15)
BUN: 6 mg/dL — ABNORMAL LOW (ref 8–23)
CO2: 22 mmol/L (ref 22–32)
Calcium: 8.4 mg/dL — ABNORMAL LOW (ref 8.9–10.3)
Chloride: 105 mmol/L (ref 98–111)
Creatinine, Ser: 0.93 mg/dL (ref 0.61–1.24)
GFR, Estimated: >60 mL/min (ref >60)
Glucose, Bld: 111 mg/dL — ABNORMAL HIGH (ref 70–99)
Potassium: 3.9 mmol/L (ref 3.5–5.1)
Sodium: 137 mmol/L (ref 135–145)
Total Bilirubin: 0.5 mg/dL (ref 0.3–1.2)
Total Protein: 6.6 g/dL (ref 6.5–8.1)

## 2022-06-03 LAB — ECHOCARDIOGRAM COMPLETE
AR max vel: 2.08 cm2
AV Peak grad: 4.1 mmHg
Ao pk vel: 1.01 m/s
Area-P 1/2: 4.39 cm2
Calc EF: 30.1 %
Height: 71 in
S' Lateral: 4.8 cm
Single Plane A2C EF: 28.5 %
Single Plane A4C EF: 32.9 %
Weight: 2320 oz

## 2022-06-03 LAB — CBC WITH DIFFERENTIAL/PLATELET
Abs Immature Granulocytes: 0.02 10*3/uL (ref 0.00–0.07)
Basophils Absolute: 0 10*3/uL (ref 0.0–0.1)
Basophils Relative: 0 %
Eosinophils Absolute: 0.2 10*3/uL (ref 0.0–0.5)
Eosinophils Relative: 2 %
HCT: 31.2 % — ABNORMAL LOW (ref 39.0–52.0)
Hemoglobin: 10.2 g/dL — ABNORMAL LOW (ref 13.0–17.0)
Immature Granulocytes: 0 %
Lymphocytes Relative: 13 %
Lymphs Abs: 0.9 10*3/uL (ref 0.7–4.0)
MCH: 25 pg — ABNORMAL LOW (ref 26.0–34.0)
MCHC: 32.7 g/dL (ref 30.0–36.0)
MCV: 76.5 fL — ABNORMAL LOW (ref 80.0–100.0)
Monocytes Absolute: 1.1 10*3/uL — ABNORMAL HIGH (ref 0.1–1.0)
Monocytes Relative: 16 %
Neutro Abs: 4.8 10*3/uL (ref 1.7–7.7)
Neutrophils Relative %: 69 %
Platelets: 301 10*3/uL (ref 150–400)
RBC: 4.08 MIL/uL — ABNORMAL LOW (ref 4.22–5.81)
RDW: 19.1 % — ABNORMAL HIGH (ref 11.5–15.5)
WBC: 7 10*3/uL (ref 4.0–10.5)
nRBC: 0 % (ref 0.0–0.2)

## 2022-06-03 LAB — POC OCCULT BLOOD, ED: Fecal Occult Bld: NEGATIVE

## 2022-06-03 LAB — TROPONIN I (HIGH SENSITIVITY): Troponin I (High Sensitivity): 17 ng/L (ref <18)

## 2022-06-03 LAB — MAGNESIUM: Magnesium: 1.8 mg/dL (ref 1.7–2.4)

## 2022-06-03 MED ORDER — ONDANSETRON HCL 4 MG/2ML IJ SOLN: 4.0000 mg | Freq: Four times a day (QID) | INTRAMUSCULAR | Status: DC | PRN

## 2022-06-03 MED ORDER — ACETAMINOPHEN 325 MG PO TABS
650.0000 mg | ORAL_TABLET | ORAL | Status: DC | PRN
  Administered 2022-06-05: 650 mg via ORAL
  Filled 2022-06-03: qty 2

## 2022-06-03 MED ORDER — AMIODARONE LOAD VIA INFUSION
150.0000 mg | Freq: Once | INTRAVENOUS | Status: AC
  Administered 2022-06-03: 150 mg via INTRAVENOUS
  Filled 2022-06-03: qty 83.34

## 2022-06-03 MED ORDER — NITROGLYCERIN 0.4 MG SL SUBL: 0.4000 mg | SUBLINGUAL_TABLET | SUBLINGUAL | Status: DC | PRN

## 2022-06-03 MED ORDER — ISOSORBIDE MONONITRATE ER 30 MG PO TB24
15.0000 mg | ORAL_TABLET | Freq: Every day | ORAL | Status: DC
  Administered 2022-06-03 – 2022-06-08 (×6): 15 mg via ORAL
  Filled 2022-06-03 (×6): qty 1

## 2022-06-03 MED ORDER — AMIODARONE HCL IN DEXTROSE 360-4.14 MG/200ML-% IV SOLN
60.0000 mg/h | INTRAVENOUS | Status: DC
  Administered 2022-06-03 (×2): 60 mg/h via INTRAVENOUS
  Filled 2022-06-03 (×2): qty 200

## 2022-06-03 MED ORDER — MOMETASONE FURO-FORMOTEROL FUM 100-5 MCG/ACT IN AERO
2.0000 | INHALATION_SPRAY | Freq: Two times a day (BID) | RESPIRATORY_TRACT | Status: DC
  Administered 2022-06-03 – 2022-06-08 (×10): 2 via RESPIRATORY_TRACT
  Filled 2022-06-03 (×2): qty 8.8

## 2022-06-03 MED ORDER — SACUBITRIL-VALSARTAN 97-103 MG PO TABS
1.0000 | ORAL_TABLET | Freq: Two times a day (BID) | ORAL | Status: DC
  Administered 2022-06-03 – 2022-06-08 (×11): 1 via ORAL
  Filled 2022-06-03 (×12): qty 1

## 2022-06-03 MED ORDER — PERFLUTREN LIPID MICROSPHERE
1.0000 mL | INTRAVENOUS | Status: AC | PRN
  Administered 2022-06-03: 6 mL via INTRAVENOUS

## 2022-06-03 MED ORDER — ATORVASTATIN CALCIUM 80 MG PO TABS
80.0000 mg | ORAL_TABLET | Freq: Every day | ORAL | Status: DC
  Administered 2022-06-03 – 2022-06-07 (×5): 80 mg via ORAL
  Filled 2022-06-03 (×5): qty 1

## 2022-06-03 MED ORDER — RIVAROXABAN 10 MG PO TABS: 20.0000 mg | ORAL_TABLET | Freq: Every day | ORAL | Status: DC

## 2022-06-03 MED ORDER — IOHEXOL 350 MG/ML SOLN
50.0000 mL | Freq: Once | INTRAVENOUS | Status: AC | PRN
  Administered 2022-06-03: 50 mL via INTRAVENOUS

## 2022-06-03 MED ORDER — AMIODARONE HCL IN DEXTROSE 360-4.14 MG/200ML-% IV SOLN
30.0000 mg/h | INTRAVENOUS | Status: DC
  Administered 2022-06-03 – 2022-06-07 (×9): 30 mg/h via INTRAVENOUS
  Filled 2022-06-03 (×8): qty 200

## 2022-06-03 MED ORDER — CARVEDILOL 6.25 MG PO TABS
6.2500 mg | ORAL_TABLET | Freq: Two times a day (BID) | ORAL | Status: DC
  Administered 2022-06-03 (×2): 6.25 mg via ORAL
  Filled 2022-06-03 (×2): qty 1

## 2022-06-03 NOTE — Progress Notes (Signed)
Echocardiogram 2D Echocardiogram has been performed.  [name] 06/03/2022, 1:40 PM

## 2022-06-03 NOTE — Consult Note (Signed)
Cardiology Consultation   Patient ID: Benjamin Cook MRN: [id number]; DOB: 1945/06/20  Admit date: 06/02/2022 Date of Consult: 06/03/2022  PCP:  [name], MD   [facility] Providers Cardiologist:  Olga Millers, MD  Electrophysiologist:  Hillis Range, MD (Inactive)  Electrophysiology APP:  Marily Lente, NP (Inactive)       Patient Profile:   Benjamin Cook is a 77 y.o. male with a hx of PAF, VT s/p ICD placement and ablation, chronic systolic HF, ICM, anemia, lung nodule who is being seen 06/03/2022 for the evaluation of lightheadedness and presyncope at the request of ER.  History of Present Illness:   Benjamin Cook has been feeling poorly over the past 2 weeks. He has been experiencing recurrent episodes of lightheadedness and presyncope which feels similar to his prior symptomatic VT. He denies chest pain, orthopnea or PND. Has baseline functional class III DOE. No ICD shocks. He was on chronic therapy with amiodarone but this was stopped in March. In the ER his device was interrogated and showed 3 episodes of VT (longest lasting 3 min) which terminated with ATP. Additionally multiple frequent non sustained VT as well as SVT labeled by the device. The majority of the EGMs are not available for evaluation.  Additionally, patient had a CT scan which showed progression of lung lesion measuring now 4.1 cm.   Past Medical History:  Diagnosis Date   Adrenal mass ([name])    per pt this is remote (10 years) and benign by biopsy   AICD (automatic cardioverter/defibrillator) present    Anxiety    Arthritis    CAD (coronary artery disease)    a. s/p PCI to LAD 2001 b. myoview 2014 high risk with scar LAD/RCA territory but no ischemia   Cardiomyopathy, ischemic    CHF (congestive heart failure) ([name])    class II/III   COPD (chronic obstructive pulmonary disease) ([name])    smokes cigars but has quit cigarettes   Defibrillator discharge 04/21/2019    Diverticulitis    Dyspnea    Flu 03/2015   HTN (hypertension)    Hyperlipidemia    NSVT (nonsustained ventricular tachycardia) ([name]) 03/05/2017   Paroxysmal atrial fibrillation ([name]) 03/2015   chads2vasc score is 4   PSA (psoriatic arthritis) ([name])    increased   PVD (peripheral vascular disease) ([name])     Past Surgical History:  Procedure Laterality Date   ABDOMINAL AORTIC ENDOVASCULAR STENT GRAFT N/A 03/03/2017   Procedure: ABDOMINAL AORTIC ENDOVASCULAR STENT GRAFT;  Surgeon: Maeola Harman, MD;  Location: [facility] OR;  Service: Vascular;  Laterality: N/A;   cataract surgery     CORONARY ANGIOGRAPHY N/A 10/12/2020   Procedure: CORONARY ANGIOGRAPHY;  Surgeon: Runell Gess, MD;  Location: [facility] INVASIVE CV LAB;  Service: Cardiovascular;  Laterality: N/A;   CORONARY ANGIOPLASTY WITH STENT PLACEMENT  2001   a. PCI to LAD   IMPLANTABLE CARDIOVERTER DEFIBRILLATOR (ICD) GENERATOR CHANGE N/A 03/18/2014   a. SJM Fortify ST DR ICD implanted by [facility] for primary prevention b. gen change 03/2014    LEAD REVISION/REPAIR N/A 07/17/2016   Procedure: Lead Revision/Repair;  Surgeon: Marinus Maw, MD;  Location: [facility] INVASIVE CV LAB;  Service: Cardiovascular;  Laterality: N/A;   LEFT HEART CATHETERIZATION WITH CORONARY ANGIOGRAM N/A 05/27/2014   Procedure: LEFT HEART CATHETERIZATION WITH CORONARY ANGIOGRAM;  Surgeon: Tonny Bollman, MD;  Location: [facility] CATH LAB;  Service: Cardiovascular;  Laterality: N/A;   REPAIR KNEE LIGAMENT  V TACH ABLATION N/A 10/26/2020   Procedure: V TACH ABLATION;  Surgeon: Marinus Maw, MD;  Location: [facility] INVASIVE CV LAB;  Service: Cardiovascular;  Laterality: N/A;     Home Medications:  Prior to Admission medications   Medication Sig Start Date End Date Taking? Authorizing Provider  acetaminophen (TYLENOL) 500 MG tablet Take 500-1,000 mg by mouth every 8 (eight) hours as needed (for pain).     [provider]  amiodarone (PACERONE) 200 MG tablet Take 1 tablet  (200 mg total) by mouth 2 (two) times daily for 30 days, THEN 1 tablet (200 mg total) daily. 02/26/22 02/21/23  Marinus Maw, MD  atorvastatin (LIPITOR) 80 MG tablet TAKE 1 TABLET BY MOUTH DAILY AT 6 PM 11/23/21   [name], MD  carvedilol (COREG) 6.25 MG tablet TAKE 1 TABLET BY MOUTH TWICE A DAY WITH MEALS 12/31/21   Marinus Maw, MD  feeding supplement (ENSURE ENLIVE / ENSURE PLUS) LIQD Try to drink 1 can 1-2 times a day. 06/04/20   Tereso Newcomer T, PA-C  Fluticasone-Salmeterol (ADVAIR) 100-50 MCG/DOSE AEPB Inhale 1 puff into the lungs 2 (two) times daily. Patient taking differently: Inhale 2 puffs into the lungs 2 (two) times daily. 01/07/20   Graciella Freer, PA-C  isosorbide mononitrate (IMDUR) 30 MG 24 hr tablet TAKE 1/2 OF A TABLET (15 MG TOTAL) BY MOUTH DAILY 12/18/21   [name], MD  melatonin 5 MG TABS Take 1 tablet (5 mg total) by mouth at bedtime. 10/16/20   Marinda Elk, MD  mometasone-formoterol (DULERA) 100-5 MCG/ACT AERO Inhale 2 puffs into the lungs in the morning and at bedtime. 01/31/22   Allred, Fayrene Fearing, MD  Multiple Vitamin (MULTIVITAMIN) capsule Take 1 capsule by mouth daily.    [provider]  nitroGLYCERIN (NITROSTAT) 0.4 MG SL tablet Place 1 tablet (0.4 mg total) under the tongue every 5 (five) minutes x 3 doses as needed for chest pain. 06/04/20 06/04/21  Tereso Newcomer T, PA-C  pantoprazole (PROTONIX) 40 MG tablet Take 1 tablet (40 mg total) by mouth daily. 10/16/20 10/16/21  Marinda Elk, MD  potassium chloride SA (KLOR-CON M20) 20 MEQ tablet TAKE 1 TABLET (20 MEQ TOTAL) BY MOUTH DAILY. PLEASE SCHEDULE APPOINTMENT FOR ADDITIONAL REFILLS. 05/20/22   Marinus Maw, MD  rivaroxaban (XARELTO) 20 MG TABS tablet Take 1 tablet (20 mg total) by mouth daily with supper. 05/08/22   [name], MD  rivaroxaban (XARELTO) 20 MG TABS tablet Take 1 tablet (20 mg total) by mouth daily with supper. 05/08/22   [name], MD   sacubitril-valsartan (ENTRESTO) 97-103 MG TAKE 1 TABLET BY MOUTH TWICE A DAY 05/21/22   [name], MD    Inpatient Medications: Scheduled Meds:  amiodarone  150 mg Intravenous Once   Continuous Infusions:  amiodarone     Followed by   amiodarone     PRN Meds:   Allergies:   No Known Allergies  Social History:   Social History   Socioeconomic History   Marital status: Married    Spouse name: Not on file   Number of children: Not on file   Years of education: Not on file   Highest education level: Not on file  Occupational History   Occupation: Lawn care  Tobacco Use   Smoking status: Every Day    Types: Cigars   Smokeless tobacco: Never   Tobacco comments:    smokes cigars now, no longer smokes cigarettes  but previously smoked 2ppd  Vaping Use   Vaping Use: Never used  Substance and Sexual Activity   Alcohol use: No   Drug use: No   Sexual activity: Not Currently    Partners: Female    Birth control/protection: None  Other Topics Concern   Not on file  Social History Narrative   Not on file   Social Determinants of Health   Financial Resource Strain: Not on file  Food Insecurity: Not on file  Transportation Needs: Not on file  Physical Activity: Not on file  Stress: Not on file  Social Connections: Not on file  Intimate Partner Violence: Not on file    Family History:   Family History  Problem Relation Age of Onset   Cirrhosis Mother        due to ETOH   Cancer Neg Hx      ROS:  Please see the history of present illness.  All other ROS reviewed and negative.     Physical Exam/Data:   Vitals:   06/03/22 0255 06/03/22 0300 06/03/22 0315 06/03/22 0330  BP: (!) 114/46 (!) 109/43 (!) 120/56 (!) 126/48  Pulse: (!) 34 (!) 56 (!) 52 71  Resp: (!) 29 (!) 24 (!) 24 (!) 26  Temp:      TempSrc:      SpO2: 99% 97% 96% 98%  Weight:      Height:        Intake/Output Summary (Last 24 hours) at 06/03/2022 0400 Last data filed at 06/02/2022  2152 Gross per 24 hour  Intake 500 ml  Output --  Net 500 ml      06/02/2022   10:00 PM 02/08/2022   10:36 AM 01/22/2022    8:42 AM  Last 3 Weights  Weight (lbs) 145 lb 147 lb 147 lb  Weight (kg) 65.772 kg 66.679 kg 66.679 kg     Body mass index is 20.22 kg/m.  General:  Well nourished, well developed, in no acute distress HEENT: normal Neck: no JVD Vascular: No carotid bruits; Distal pulses 2+ bilaterally Cardiac:  normal S1, S2; RRR; no murmur  Lungs:  clear to auscultation bilaterally, no wheezing, rhonchi or rales  Abd: soft, nontender, no hepatomegaly  Ext: no edema Musculoskeletal:  No deformities, BUE and BLE strength normal and equal Skin: warm and dry  Neuro:  CNs 2-12 intact, no focal abnormalities noted Psych:  Normal affect   EKG:  The EKG was personally reviewed and demonstrates:  NSR, NSVT Telemetry:  Telemetry was personally reviewed and demonstrates:  salvos of NSVT  Relevant CV Studies:   Laboratory Data:  High Sensitivity Troponin:   Recent Labs  Lab 06/02/22 2306 06/03/22 0256  TROPONINIHS 19* 17     Chemistry Recent Labs  Lab 06/02/22 2306  NA 137  K 4.0  CL 105  CO2 23  GLUCOSE 100*  BUN 7*  CREATININE 0.97  CALCIUM 8.1*  GFRNONAA >60  ANIONGAP 9    No results for input(s): "PROT", "ALBUMIN", "AST", "ALT", "ALKPHOS", "BILITOT" in the last 168 hours. Lipids No results for input(s): "CHOL", "TRIG", "HDL", "LABVLDL", "LDLCALC", "CHOLHDL" in the last 168 hours.  Hematology Recent Labs  Lab 06/02/22 2306  WBC 6.8  RBC 3.91*  HGB 9.8*  HCT 30.5*  MCV 78.0*  MCH 25.1*  MCHC 32.1  RDW 19.1*  PLT 297   Thyroid No results for input(s): "TSH", "FREET4" in the last 168 hours.  BNPNo results for input(s): "BNP", "PROBNP" in the  last 168 hours.  DDimer No results for input(s): "DDIMER" in the last 168 hours.   Radiology/Studies:  CT Chest W Contrast  Result Date: 06/03/2022 CLINICAL DATA:  Pleural effusion, malignancy suspected.  EXAM: CT CHEST WITH CONTRAST TECHNIQUE: Multidetector CT imaging of the chest was performed during intravenous contrast administration. RADIATION DOSE REDUCTION: This exam was performed according to the departmental dose-optimization program which includes automated exposure control, adjustment of the mA and/or kV according to patient size and/or use of iterative reconstruction technique. CONTRAST:  50mL OMNIPAQUE IOHEXOL 350 MG/ML SOLN COMPARISON:  10/13/2021. FINDINGS: Cardiovascular: The heart is enlarged and there is a small pericardial effusion. Pacemaker leads are noted in the heart. Three-vessel coronary artery calcifications are noted. There is atherosclerotic calcification of the aorta without evidence of aneurysm. The pulmonary trunk is normal in caliber. Mediastinum/Nodes: Prominent lymph node is present in the subcarinal space measuring 1 cm. No hilar or axillary lymphadenopathy is seen. The thyroid gland, trachea, and esophagus are within normal limits. There is a small hiatal hernia. Lungs/Pleura: Paraseptal and centrilobular emphysematous changes are present in the lungs. Debris is present in the left mainstem bronchus. There is bronchial wall thickening bilaterally with multifocal mucous plugging in the bilateral lower lobes, greater on the left than on the right. Atelectasis is noted at the lung bases. There are trace bilateral pleural effusions. Trace bilateral pleural effusions are noted. No pneumothorax. There is redemonstration of a mass in the suprahilar region on the right measuring 4.1 x 4.1 cm, increased in size from the prior exam. Pleural and parenchymal scarring is noted bilaterally. Upper Abdomen: There is a 4.1 cm mass in the right adrenal gland containing calcifications, unchanged from 2009. Cysts are present in the right kidney. Subcentimeter hypodensities are present in the left kidney which are too small to further characterize. An aortic endograft is noted in the upper abdomen. No  acute abnormality. Musculoskeletal: A pacemaker device is present in the anterior chest wall on the left. Degenerative changes are present in the thoracic spine. No acute or suspicious osseous abnormality. IMPRESSION: 1. Interval enlargement of suprahilar mass on the right now measuring 4.1 x 4.1 cm, suspicious for neoplasm. PET-CT or biopsy is recommended for further evaluation. 2. Bilateral bronchial wall thickening with multifocal mucous plugging in the lower lobes bilaterally, greater on the left than on the right. 3. Emphysema. 4. Aortic atherosclerosis and coronary artery calcifications. Electronically Signed   By: Thornell Sartorius M.D.   On: 06/03/2022 01:28   DG Chest 2 View  Result Date: 06/02/2022 CLINICAL DATA:  Chest pain EXAM: CHEST - 2 VIEW COMPARISON:  01/22/2022 FINDINGS: Cardiac shadow is within normal limits. Defibrillator is again noted. Aortic calcifications are seen. Mild scarring is noted in the right apex stable from the prior exam. Fullness in the right hilar region is noted which corresponds to a soft tissue density seen on prior CT examination from 10/13/2021. This may represent enlarging right hilar mass. No bony abnormality is noted. No other focal abnormality is seen. IMPRESSION: Prominent right hilum with focal mass lesion on the lateral projection similar to that seen on prior CT examination. Again this is suspicious for neoplasm. Follow-up CT is recommended for further evaluation. Electronically Signed   By: Alcide Clever M.D.   On: 06/02/2022 23:32     Assessment and Plan:   77 y/o with hx of VT s/p ablation in 2022, s/p ICD placement, ICM, multivessel CAD (patient deemed non CABG candidate), HFrEF 35%, anemia and lung nodule  who was admitted with presyncope.  #Presyncope #Recurrent episodes of VT #VT s/p ablation in 2022 #S/p ICD placement - Frequent salvos of non sustained VT in the ER - 3 documented episodes in the past 24 hrs per device interrogation, that terminated  with ATP - On amiodarone in the past and now discontinued Plan: - Given frequent salvos and 3 episodes of VT requiring therapy in the last 24 hrs (longest episode lasting 3 min) will keep him on IV amiodarone overnight - Keep K>4 and Mg>2 - EP consultation in am  #Non valvular afib: - Continue a/c  #Multivessel CAD #HFrEF from ICM (35%) - Considered non cabg candidate in 2022 - No angina - Baseline NYHA III - Euvolemic on exam Plan: - Continue Coreg 6.25 BID - Cotninue Entresto 97-103 BID  #Lung mass: - Workup per IM team   Risk Assessment/Risk Scores:                For questions or updates, please contact [facility] Please consult [email or web address] for contact info under    Signed, Regan Rakers, MD  06/03/2022 4:00 AM

## 2022-06-03 NOTE — Assessment & Plan Note (Addendum)
Ischemic cardiomyopathy.  EF 20-25% this hospitalization.  Appears euvolemic - Continue Imdur, metoprolol, Entresto - Start spironolactone

## 2022-06-03 NOTE — Assessment & Plan Note (Addendum)
-   Transition to PO amiodarone - Continue metoprolol - Consult EP, appreciate expertise

## 2022-06-03 NOTE — Consult Note (Addendum)
ELECTROPHYSIOLOGY CONSULT NOTE    Patient ID: Benjamin Cook MRN: [id number], DOB/AGE: Jun 10, 1945 77 y.o.  Admit date: 06/02/2022 Date of Consult: 06/03/2022  Primary Physician: [name], MD Primary Cardiologist: Olga Millers, MD  Electrophysiologist: Dr. Johney Frame -> Dr. Ladona Ridgel   Referring Provider: Dr. Mayford Knife  Patient Profile: Benjamin Cook is a 77 y.o. male with a history of PAF, VT s/p ICD placement, VT ablation 10/2020, Chronic systolic CHF, Anemia, lung nodule who is being seen today for the evaluation of VT at the request of Dr. Mayford Knife.  HPI:  Benjamin Cook is a 77 y.o. male medical history as above.  Pt previously had VT with ATP 01/2022, had otherwise been relatively quiescent since ablation 10/2020.  Seen by Dr. Ladona Ridgel 02/2022. ESR drawn as part of surveillance of amiodarone.  This came back as elevated so planned for recheck x 3 month, which was also elevated. Felt possible that amio could be contributing, so was stopped.   Pt presented to [facility] 4/[facility] feeling poorly. He was having recurrent lightheadedness and presyncope consistent with prior VT episodes. No CP, orthopnea, or PND. Has NYHA III symptoms at baseline. Denies ICD shocks  Device interrogation showed 3 episodes of VT (longest ~ 3 minute) terminated with ATP. Had multiple frequent NSVT and bigeminal couplets marked as SVT by the device.  Loaded on IV amiodarone.   In addition, CT noted that lung lesion has increased in size from ~2cm to now > 4 cm  Pt is feeling OK in bed today. His wife was not previously aware of his lung lesion, and is very interested in work up and possible treatment. No CP currently and no SOB at rest. Denies syncope.   Labs Potassium3.9 (04/29 0700)   Creatinine, ser  0.93 (04/29 0700) PLT  301 (04/29 0700) HGB  10.2* (04/29 0700) WBC 7.0 (04/29 0700) Troponin I (High Sensitivity)17 (04/29 0256).    Past Medical History:  Diagnosis Date   Adrenal mass ([name])    per  pt this is remote (10 years) and benign by biopsy   AICD (automatic cardioverter/defibrillator) present    Anxiety    Arthritis    CAD (coronary artery disease)    a. s/p PCI to LAD 2001 b. myoview 2014 high risk with scar LAD/RCA territory but no ischemia   Cardiomyopathy, ischemic    CHF (congestive heart failure) ([name])    class II/III   COPD (chronic obstructive pulmonary disease) ([name])    smokes cigars but has quit cigarettes   Defibrillator discharge 04/21/2019   Diverticulitis    Dyspnea    Flu 03/2015   HTN (hypertension)    Hyperlipidemia    NSVT (nonsustained ventricular tachycardia) ([name]) 03/05/2017   Paroxysmal atrial fibrillation ([name]) 03/2015   chads2vasc score is 4   PSA (psoriatic arthritis) ([name])    increased   PVD (peripheral vascular disease) ([name])      Surgical History:  Past Surgical History:  Procedure Laterality Date   ABDOMINAL AORTIC ENDOVASCULAR STENT GRAFT N/A 03/03/2017   Procedure: ABDOMINAL AORTIC ENDOVASCULAR STENT GRAFT;  Surgeon: Maeola Harman, MD;  Location: [facility] OR;  Service: Vascular;  Laterality: N/A;   cataract surgery     CORONARY ANGIOGRAPHY N/A 10/12/2020   Procedure: CORONARY ANGIOGRAPHY;  Surgeon: Runell Gess, MD;  Location: [facility] INVASIVE CV LAB;  Service: Cardiovascular;  Laterality: N/A;   CORONARY ANGIOPLASTY WITH STENT PLACEMENT  2001   a. PCI to LAD   IMPLANTABLE CARDIOVERTER  DEFIBRILLATOR (ICD) GENERATOR CHANGE N/A 03/18/2014   a. SJM Fortify ST DR ICD implanted by [facility] for primary prevention b. gen change 03/2014    LEAD REVISION/REPAIR N/A 07/17/2016   Procedure: Lead Revision/Repair;  Surgeon: Marinus Maw, MD;  Location: [facility] INVASIVE CV LAB;  Service: Cardiovascular;  Laterality: N/A;   LEFT HEART CATHETERIZATION WITH CORONARY ANGIOGRAM N/A 05/27/2014   Procedure: LEFT HEART CATHETERIZATION WITH CORONARY ANGIOGRAM;  Surgeon: Tonny Bollman, MD;  Location: [facility] CATH LAB;  Service: Cardiovascular;  Laterality: N/A;   REPAIR  KNEE LIGAMENT     V TACH ABLATION N/A 10/26/2020   Procedure: V TACH ABLATION;  Surgeon: Marinus Maw, MD;  Location: [facility] INVASIVE CV LAB;  Service: Cardiovascular;  Laterality: N/A;     Medications Prior to Admission  Medication Sig Dispense Refill Last Dose   acetaminophen (TYLENOL) 500 MG tablet Take 500-1,000 mg by mouth every 8 (eight) hours as needed (for pain).    Past Week   atorvastatin (LIPITOR) 80 MG tablet TAKE 1 TABLET BY MOUTH DAILY AT 6 PM (Patient taking differently: Take 80 mg by mouth daily. TAKE 1 TABLET BY MOUTH  DAILY AT 6 PM) 90 tablet 3 06/02/2022   carvedilol (COREG) 6.25 MG tablet TAKE 1 TABLET BY MOUTH TWICE A DAY WITH MEALS (Patient taking differently: Take 6.25 mg by mouth 2 (two) times daily with a meal.) 180 tablet 2 06/02/2022   isosorbide mononitrate (IMDUR) 30 MG 24 hr tablet TAKE 1/2 OF A TABLET (15 MG TOTAL) BY MOUTH DAILY (Patient taking differently: Take 30 mg by mouth daily.) 45 tablet 3 06/02/2022   melatonin 5 MG TABS Take 1 tablet (5 mg total) by mouth at bedtime. 30 tablet 0 06/02/2022   mometasone-formoterol (DULERA) 100-5 MCG/ACT AERO Inhale 2 puffs into the lungs in the morning and at bedtime. 8.8 each 1 Past Week   Multiple Vitamin (MULTIVITAMIN) capsule Take 1 capsule by mouth daily.   06/02/2022   potassium chloride SA (KLOR-CON M20) 20 MEQ tablet TAKE 1 TABLET (20 MEQ TOTAL) BY MOUTH DAILY. PLEASE SCHEDULE APPOINTMENT FOR ADDITIONAL REFILLS. (Patient taking differently: Take 20 mEq by mouth daily.) 90 tablet 2 06/02/2022   rivaroxaban (XARELTO) 20 MG TABS tablet Take 1 tablet (20 mg total) by mouth daily with supper. 30 tablet 3 06/02/2022   sacubitril-valsartan (ENTRESTO) 97-103 MG TAKE 1 TABLET BY MOUTH TWICE A DAY (Patient taking differently: Take 1 tablet by mouth 2 (two) times daily.) 180 tablet 3 06/02/2022   amiodarone (PACERONE) 200 MG tablet Take 1 tablet (200 mg total) by mouth 2 (two) times daily for 30 days, THEN 1 tablet (200 mg total) daily.  (Patient not taking: Reported on 06/03/2022) 90 tablet 3 Completed Course   feeding supplement (ENSURE ENLIVE / ENSURE PLUS) LIQD Try to drink 1 can 1-2 times a day. (Patient not taking: Reported on 06/03/2022)   Not Taking   Fluticasone-Salmeterol (ADVAIR) 100-50 MCG/DOSE AEPB Inhale 1 puff into the lungs 2 (two) times daily. (Patient not taking: Reported on 06/03/2022) 1 each 0 Not Taking   nitroGLYCERIN (NITROSTAT) 0.4 MG SL tablet Place 1 tablet (0.4 mg total) under the tongue every 5 (five) minutes x 3 doses as needed for chest pain. (Patient not taking: Reported on 06/03/2022) 25 tablet 12 Not Taking   pantoprazole (PROTONIX) 40 MG tablet Take 1 tablet (40 mg total) by mouth daily. (Patient not taking: Reported on 06/03/2022) 30 tablet 1 Not Taking   rivaroxaban (XARELTO) 20 MG TABS tablet  Take 1 tablet (20 mg total) by mouth daily with supper. (Patient not taking: Reported on 06/03/2022) 14 tablet 0 Not Taking    Inpatient Medications:   atorvastatin  80 mg Oral q1800   carvedilol  6.25 mg Oral BID WC   isosorbide mononitrate  15 mg Oral Daily   mometasone-formoterol  2 puff Inhalation BID   sacubitril-valsartan  1 tablet Oral BID    Allergies: No Known Allergies  Family History  Problem Relation Age of Onset   Cirrhosis Mother        due to ETOH   Cancer Neg Hx      Physical Exam: Vitals:   06/03/22 0542 06/03/22 0551 06/03/22 0741 06/03/22 1204  BP: (!) 130/57 (!) 124/55 (!) 125/57 128/66  Pulse: 63 66 66 65  Resp: 20 18 (!) 21 20  Temp:  97.7 F (36.5 C) 98.4 F (36.9 C) 98.5 F (36.9 C)  TempSrc:  Oral Oral Oral  SpO2: 98% 98% 100% 99%  Weight:      Height:        GEN- NAD, A&O x 3, normal affect HEENT: Normocephalic, atraumatic Lungs- CTAB, Normal effort.  Heart- Regular rate and rhythm, No M/G/R.  GI- Soft, NT, ND.  Extremities- No clubbing, cyanosis, or edema   Radiology/Studies: CT Chest W Contrast  Result Date: 06/03/2022 CLINICAL DATA:  Pleural effusion,  malignancy suspected. EXAM: CT CHEST WITH CONTRAST TECHNIQUE: Multidetector CT imaging of the chest was performed during intravenous contrast administration. RADIATION DOSE REDUCTION: This exam was performed according to the departmental dose-optimization program which includes automated exposure control, adjustment of the mA and/or kV according to patient size and/or use of iterative reconstruction technique. CONTRAST:  50mL OMNIPAQUE IOHEXOL 350 MG/ML SOLN COMPARISON:  10/13/2021. FINDINGS: Cardiovascular: The heart is enlarged and there is a small pericardial effusion. Pacemaker leads are noted in the heart. Three-vessel coronary artery calcifications are noted. There is atherosclerotic calcification of the aorta without evidence of aneurysm. The pulmonary trunk is normal in caliber. Mediastinum/Nodes: Prominent lymph node is present in the subcarinal space measuring 1 cm. No hilar or axillary lymphadenopathy is seen. The thyroid gland, trachea, and esophagus are within normal limits. There is a small hiatal hernia. Lungs/Pleura: Paraseptal and centrilobular emphysematous changes are present in the lungs. Debris is present in the left mainstem bronchus. There is bronchial wall thickening bilaterally with multifocal mucous plugging in the bilateral lower lobes, greater on the left than on the right. Atelectasis is noted at the lung bases. There are trace bilateral pleural effusions. Trace bilateral pleural effusions are noted. No pneumothorax. There is redemonstration of a mass in the suprahilar region on the right measuring 4.1 x 4.1 cm, increased in size from the prior exam. Pleural and parenchymal scarring is noted bilaterally. Upper Abdomen: There is a 4.1 cm mass in the right adrenal gland containing calcifications, unchanged from 2009. Cysts are present in the right kidney. Subcentimeter hypodensities are present in the left kidney which are too small to further characterize. An aortic endograft is noted in  the upper abdomen. No acute abnormality. Musculoskeletal: A pacemaker device is present in the anterior chest wall on the left. Degenerative changes are present in the thoracic spine. No acute or suspicious osseous abnormality. IMPRESSION: 1. Interval enlargement of suprahilar mass on the right now measuring 4.1 x 4.1 cm, suspicious for neoplasm. PET-CT or biopsy is recommended for further evaluation. 2. Bilateral bronchial wall thickening with multifocal mucous plugging in the lower lobes bilaterally, greater  on the left than on the right. 3. Emphysema. 4. Aortic atherosclerosis and coronary artery calcifications. Electronically Signed   By: Thornell Sartorius M.D.   On: 06/03/2022 01:28   DG Chest 2 View  Result Date: 06/02/2022 CLINICAL DATA:  Chest pain EXAM: CHEST - 2 VIEW COMPARISON:  01/22/2022 FINDINGS: Cardiac shadow is within normal limits. Defibrillator is again noted. Aortic calcifications are seen. Mild scarring is noted in the right apex stable from the prior exam. Fullness in the right hilar region is noted which corresponds to a soft tissue density seen on prior CT examination from 10/13/2021. This may represent enlarging right hilar mass. No bony abnormality is noted. No other focal abnormality is seen. IMPRESSION: Prominent right hilum with focal mass lesion on the lateral projection similar to that seen on prior CT examination. Again this is suspicious for neoplasm. Follow-up CT is recommended for further evaluation. Electronically Signed   By: Alcide Clever M.D.   On: 06/02/2022 23:32    EKG:on arrival showed NSR at 88 with very frequent ectopy in couplets and triplets (personally reviewed)  TELEMETRY: NSR 60s with frequent PVCs, earlier in stay had frequent couplets and triplets, and times bigeminal.  (personally reviewed)  DEVICE HISTORY: [employer] Dual Chamber ICD implanted 2016 for CHF History of AAD therapy: Yes; currently on amiodarone   + h/o appropriate  therapy.  Assessment/Plan:   VT Had previously been quiescent s/p ablation apart from episode 01/2022  Amiodarone stopped 04/2022 for ESR+. No clear symptoms were documented.  EGM up ATP treated episode is not available. "SVT" episodes are consistent with dense ectopy. (Couplets/triplets in bigeminy) Continue IV amiodarone. Most recent dense ectopy/NSVT was overnight ~ 0330. Continue at least overnight. Potassium3.9 (04/29 0700) Mg pending Creatinine, ser  0.93 (04/29 0700) Keep K > 4.0 and Mg > 2.0  Continue coreg 6.25 mg BID, titrate as tolerated.   Chronic systolic CHF ICM Denies s/s ischemia, HS trop flat and near baseline Continue statin Continue GDMT  CAD h/o LAD stent HS trop negative as above.  Mod to severe non-obstructive disease 10/2020  HTN Stable on current regimen    Afib Burden low chronically Continue xarelto for chads2vasc score of 5.   Lung mass Highly concerning for neoplasm given size change from 10/2021 Per medicine team If pt declines work up will need palliative care.    For questions or updates, please contact [facility] Please consult [email or web address] for contact info under Cardiology/STEMI.  Dustin Flock, PA-C  06/03/2022 12:09 PM

## 2022-06-03 NOTE — Assessment & Plan Note (Signed)
Patient has been placed on IV amiodarone per cardiology.  Further management per cardiology team.

## 2022-06-03 NOTE — Consult Note (Addendum)
Hospitalist Consultation History and Physical    Benjamin Cook:[id number] DOB: 1945-12-01 DOA: 06/02/2022  DOS: the patient was seen and examined on 06/02/2022  PCP: [name], MD   Patient coming from: Home  I have personally briefly reviewed patient's old medical records in [facility]  CC: chest pain, dizziness HPI: 77 year old male history of ventricular tachycardia status post VT ablation September 2022 status post ICD placement, known adrenal mass, hypertension, COPD, history of atrial fibrillation on anticoagulation, chronic combined heart failure, who presents to the ER with chest pain and dizziness for about a weeks duration.  He states that he feels very dizzy suddenly at times.  He has not had a discharge of his ICD.  Denies any fever or chills.  He states that he has been losing weight about 5 pounds in the last several weeks.  Patient states that he was told about this pulmonary nodule he had in September 2023.  He states that he did not want it worked up at that time.  Patient had his defibrillator interrogated this evening.  It shows he has had runs of ventricular tachycardia without shock.  CT chest was performed which shows increase in the previously known right lung mass.  Now measuring 4.1 x 4.1 cm.  Back in September 2023, this mass was 1.8 x 1.9 x 2.0 cm.  [facility] hospital[facility] consulted for his lung mass.  Patient states that he did not want this worked up back in 2023.  I told him that his lung mass has doubled in size since September 2023 and most likely this is a neoplastic process.  Patient states that he would never want chemotherapy because he knows it would make him sick.  He states that he would not want to know if he has cancer or not because this would make him extremely anxious.  He states he will think about workup of this lung mass after talking with his family but at this point he does not want any further investigation of this lung mass.   He understands that it is most likely a neoplastic process.   ED Course: ICD interrogated. Per verbal report from EDP, shows pt with multiple runs of VT without shock. CT chest shows Right hilar mass. Now increase to 4.0 cm. Was 2.1 cm in 10/2021  Review of Systems:  Review of Systems  Constitutional:  Positive for weight loss.  HENT: Negative.    Eyes: Negative.   Respiratory:  Positive for shortness of breath.   Cardiovascular: Negative.  Negative for chest pain.  Gastrointestinal: Negative.   Genitourinary: Negative.   Musculoskeletal: Negative.   Skin: Negative.   Neurological:  Positive for dizziness.  Endo/Heme/Allergies: Negative.   Psychiatric/Behavioral: Negative.    All other systems reviewed and are negative.   Past Medical History:  Diagnosis Date   Adrenal mass ([name])    per pt this is remote (10 years) and benign by biopsy   AICD (automatic cardioverter/defibrillator) present    Anxiety    Arthritis    CAD (coronary artery disease)    a. s/p PCI to LAD 2001 b. myoview 2014 high risk with scar LAD/RCA territory but no ischemia   Cardiomyopathy, ischemic    CHF (congestive heart failure) ([name])    class II/III   COPD (chronic obstructive pulmonary disease) ([name])    smokes cigars but has quit cigarettes   Defibrillator discharge 04/21/2019   Diverticulitis    Dyspnea    Flu 03/2015  HTN (hypertension)    Hyperlipidemia    NSVT (nonsustained ventricular tachycardia) ([name]) 03/05/2017   Paroxysmal atrial fibrillation ([name]) 03/2015   chads2vasc score is 4   PSA (psoriatic arthritis) ([name])    increased   PVD (peripheral vascular disease) ([name])     Past Surgical History:  Procedure Laterality Date   ABDOMINAL AORTIC ENDOVASCULAR STENT GRAFT N/A 03/03/2017   Procedure: ABDOMINAL AORTIC ENDOVASCULAR STENT GRAFT;  Surgeon: Maeola Harman, MD;  Location: [facility] OR;  Service: Vascular;  Laterality: N/A;   cataract surgery     CORONARY ANGIOGRAPHY N/A 10/12/2020    Procedure: CORONARY ANGIOGRAPHY;  Surgeon: Runell Gess, MD;  Location: [facility] INVASIVE CV LAB;  Service: Cardiovascular;  Laterality: N/A;   CORONARY ANGIOPLASTY WITH STENT PLACEMENT  2001   a. PCI to LAD   IMPLANTABLE CARDIOVERTER DEFIBRILLATOR (ICD) GENERATOR CHANGE N/A 03/18/2014   a. SJM Fortify ST DR ICD implanted by [facility] for primary prevention b. gen change 03/2014    LEAD REVISION/REPAIR N/A 07/17/2016   Procedure: Lead Revision/Repair;  Surgeon: Marinus Maw, MD;  Location: [facility] INVASIVE CV LAB;  Service: Cardiovascular;  Laterality: N/A;   LEFT HEART CATHETERIZATION WITH CORONARY ANGIOGRAM N/A 05/27/2014   Procedure: LEFT HEART CATHETERIZATION WITH CORONARY ANGIOGRAM;  Surgeon: Tonny Bollman, MD;  Location: [facility] CATH LAB;  Service: Cardiovascular;  Laterality: N/A;   REPAIR KNEE LIGAMENT     V TACH ABLATION N/A 10/26/2020   Procedure: V TACH ABLATION;  Surgeon: Marinus Maw, MD;  Location: [facility] INVASIVE CV LAB;  Service: Cardiovascular;  Laterality: N/A;     reports that he has been smoking cigars. He has never used smokeless tobacco. He reports that he does not drink alcohol and does not use drugs.  No Known Allergies  Family History  Problem Relation Age of Onset   Cirrhosis Mother        due to ETOH   Cancer Neg Hx     Prior to Admission medications   Medication Sig Start Date End Date Taking? Authorizing Provider  acetaminophen (TYLENOL) 500 MG tablet Take 500-1,000 mg by mouth every 8 (eight) hours as needed (for pain).     [provider]  amiodarone (PACERONE) 200 MG tablet Take 1 tablet (200 mg total) by mouth 2 (two) times daily for 30 days, THEN 1 tablet (200 mg total) daily. 02/26/22 02/21/23  Marinus Maw, MD  atorvastatin (LIPITOR) 80 MG tablet TAKE 1 TABLET BY MOUTH DAILY AT 6 PM 11/23/21   Benjamin Bunting, MD  carvedilol (COREG) 6.25 MG tablet TAKE 1 TABLET BY MOUTH TWICE A DAY WITH MEALS 12/31/21   Marinus Maw, MD  feeding supplement (ENSURE ENLIVE  / ENSURE PLUS) LIQD Try to drink 1 can 1-2 times a day. 06/04/20   Tereso Newcomer T, PA-C  Fluticasone-Salmeterol (ADVAIR) 100-50 MCG/DOSE AEPB Inhale 1 puff into the lungs 2 (two) times daily. Patient taking differently: Inhale 2 puffs into the lungs 2 (two) times daily. 01/07/20   Graciella Freer, PA-C  isosorbide mononitrate (IMDUR) 30 MG 24 hr tablet TAKE 1/2 OF A TABLET (15 MG TOTAL) BY MOUTH DAILY 12/18/21   Benjamin Bunting, MD  melatonin 5 MG TABS Take 1 tablet (5 mg total) by mouth at bedtime. 10/16/20   Marinda Elk, MD  mometasone-formoterol (DULERA) 100-5 MCG/ACT AERO Inhale 2 puffs into the lungs in the morning and at bedtime. 01/31/22   Hillis Range, MD  Multiple Vitamin (MULTIVITAMIN) capsule Take 1  capsule by mouth daily.    [provider]  nitroGLYCERIN (NITROSTAT) 0.4 MG SL tablet Place 1 tablet (0.4 mg total) under the tongue every 5 (five) minutes x 3 doses as needed for chest pain. 06/04/20 06/04/21  Tereso Newcomer T, PA-C  pantoprazole (PROTONIX) 40 MG tablet Take 1 tablet (40 mg total) by mouth daily. 10/16/20 10/16/21  Marinda Elk, MD  potassium chloride SA (KLOR-CON M20) 20 MEQ tablet TAKE 1 TABLET (20 MEQ TOTAL) BY MOUTH DAILY. PLEASE SCHEDULE APPOINTMENT FOR ADDITIONAL REFILLS. 05/20/22   Marinus Maw, MD  rivaroxaban (XARELTO) 20 MG TABS tablet Take 1 tablet (20 mg total) by mouth daily with supper. 05/08/22   Benjamin Bunting, MD  rivaroxaban (XARELTO) 20 MG TABS tablet Take 1 tablet (20 mg total) by mouth daily with supper. 05/08/22   Benjamin Bunting, MD  sacubitril-valsartan (ENTRESTO) 97-103 MG TAKE 1 TABLET BY MOUTH TWICE A DAY 05/21/22   Benjamin Bunting, MD    Physical Exam: Vitals:   06/03/22 0345 06/03/22 0400 06/03/22 0415 06/03/22 0431  BP: 123/64 (!) 124/48 (!) 115/43 (!) 123/56  Pulse: (!) 31 (!) 38 (!) 44 (!) 36  Resp: 18 (!) 21 (!) 23 (!) 22  Temp:      TempSrc:      SpO2: 98% 96% 99% 99%  Weight:      Height:         Physical Exam Vitals and nursing note reviewed.  Constitutional:      General: He is not in acute distress.    Appearance: Normal appearance. He is not toxic-appearing or diaphoretic.  HENT:     Head: Atraumatic.     Nose: Nose normal.  Cardiovascular:     Rate and Rhythm: Normal rate. Rhythm irregular.     Pulses: Normal pulses.  Pulmonary:     Effort: Pulmonary effort is normal. No respiratory distress.     Breath sounds: Normal breath sounds.  Abdominal:     General: Abdomen is flat. Bowel sounds are normal. There is no distension.     Palpations: Abdomen is soft.  Musculoskeletal:     Right lower leg: No edema.     Left lower leg: No edema.  Skin:    General: Skin is warm and dry.     Capillary Refill: Capillary refill takes less than 2 seconds.  Neurological:     General: No focal deficit present.     Mental Status: He is alert and oriented to person, place, and time.      Labs on Admission: I have personally reviewed following labs and imaging studies  CBC: Recent Labs  Lab 06/02/22 2306  WBC 6.8  HGB 9.8*  HCT 30.5*  MCV 78.0*  PLT 297   Basic Metabolic Panel: Recent Labs  Lab 06/02/22 2306  NA 137  K 4.0  CL 105  CO2 23  GLUCOSE 100*  BUN 7*  CREATININE 0.97  CALCIUM 8.1*   GFR: Estimated Creatinine Clearance: 58.4 mL/min (by C-G formula based on SCr of 0.97 mg/dL).  Cardiac Enzymes: Recent Labs  Lab 06/02/22 2306 06/03/22 0256  TROPONINIHS 19* 17   BNP (last 3 results) Recent Labs    10/13/21 1819 01/22/22 0833  BNP 159.5* 228.2*   Radiological Exams on Admission: I have personally reviewed images CT Chest W Contrast  Result Date: 06/03/2022 CLINICAL DATA:  Pleural effusion, malignancy suspected. EXAM: CT CHEST WITH CONTRAST TECHNIQUE: Multidetector CT imaging of the chest was performed  during intravenous contrast administration. RADIATION DOSE REDUCTION: This exam was performed according to the departmental dose-optimization  program which includes automated exposure control, adjustment of the mA and/or kV according to patient size and/or use of iterative reconstruction technique. CONTRAST:  50mL OMNIPAQUE IOHEXOL 350 MG/ML SOLN COMPARISON:  10/13/2021. FINDINGS: Cardiovascular: The heart is enlarged and there is a small pericardial effusion. Pacemaker leads are noted in the heart. Three-vessel coronary artery calcifications are noted. There is atherosclerotic calcification of the aorta without evidence of aneurysm. The pulmonary trunk is normal in caliber. Mediastinum/Nodes: Prominent lymph node is present in the subcarinal space measuring 1 cm. No hilar or axillary lymphadenopathy is seen. The thyroid gland, trachea, and esophagus are within normal limits. There is a small hiatal hernia. Lungs/Pleura: Paraseptal and centrilobular emphysematous changes are present in the lungs. Debris is present in the left mainstem bronchus. There is bronchial wall thickening bilaterally with multifocal mucous plugging in the bilateral lower lobes, greater on the left than on the right. Atelectasis is noted at the lung bases. There are trace bilateral pleural effusions. Trace bilateral pleural effusions are noted. No pneumothorax. There is redemonstration of a mass in the suprahilar region on the right measuring 4.1 x 4.1 cm, increased in size from the prior exam. Pleural and parenchymal scarring is noted bilaterally. Upper Abdomen: There is a 4.1 cm mass in the right adrenal gland containing calcifications, unchanged from 2009. Cysts are present in the right kidney. Subcentimeter hypodensities are present in the left kidney which are too small to further characterize. An aortic endograft is noted in the upper abdomen. No acute abnormality. Musculoskeletal: A pacemaker device is present in the anterior chest wall on the left. Degenerative changes are present in the thoracic spine. No acute or suspicious osseous abnormality. IMPRESSION: 1. Interval  enlargement of suprahilar mass on the right now measuring 4.1 x 4.1 cm, suspicious for neoplasm. PET-CT or biopsy is recommended for further evaluation. 2. Bilateral bronchial wall thickening with multifocal mucous plugging in the lower lobes bilaterally, greater on the left than on the right. 3. Emphysema. 4. Aortic atherosclerosis and coronary artery calcifications. Electronically Signed   By: Thornell Sartorius M.D.   On: 06/03/2022 01:28   DG Chest 2 View  Result Date: 06/02/2022 CLINICAL DATA:  Chest pain EXAM: CHEST - 2 VIEW COMPARISON:  01/22/2022 FINDINGS: Cardiac shadow is within normal limits. Defibrillator is again noted. Aortic calcifications are seen. Mild scarring is noted in the right apex stable from the prior exam. Fullness in the right hilar region is noted which corresponds to a soft tissue density seen on prior CT examination from 10/13/2021. This may represent enlarging right hilar mass. No bony abnormality is noted. No other focal abnormality is seen. IMPRESSION: Prominent right hilum with focal mass lesion on the lateral projection similar to that seen on prior CT examination. Again this is suspicious for neoplasm. Follow-up CT is recommended for further evaluation. Electronically Signed   By: Alcide Clever M.D.   On: 06/02/2022 23:32    EKG: My personal interpretation of EKG shows: NSR, frequent PVCs     Assessment/Plan Active Problems:   Mass of right lung   VT (ventricular tachycardia) ([name])   HFrEF (heart failure with reduced ejection fraction) ([name])   ICD (implantable cardioverter-defibrillator) in place   Essential hypertension   Paroxysmal atrial fibrillation ([name])    Assessment and Plan: Mass of right lung Increasing size of known lung mass from September 2023.  It is about doubled in  size in the last 7 months.  Patient did not want this worked up in September 2023.  He states that he thinks his PCP told him about it.  Patient still feels he does not want to know if it  is cancer or not.  He states that he would feel very anxious if he knew he had cancer.  He states that he would never want to have chemotherapy because he knows that it would make him very ill.  He request no further workup of his lung mass.  He states that he will talk to his family and tell the medical team if he changes his mind.  VT (ventricular tachycardia) ([name]) Patient has been placed on IV amiodarone per cardiology.  Further management per cardiology team.  Essential hypertension Continue his Coreg, Imdur  ICD (implantable cardioverter-defibrillator) in place Chronic.  HFrEF (heart failure with reduced ejection fraction) ([name]) .  Continue his Coreg, Entresto.  Paroxysmal atrial fibrillation ([name]) Continue Xarelto.   DVT prophylaxis: Xarelto Code Status: Full Code Family Communication: no family at bedside  Disposition Plan: return home  Admission status:  pt to be admitted by cardiology ,  [facility] will follow for any medical concerns, but pt does not want his lung mass to be investigated at this time .  If he changes his mind, please consult pulmonology for lung mass workup.   Carollee Herter, DO [facility] 06/03/2022, 4:49 AM

## 2022-06-03 NOTE — Assessment & Plan Note (Addendum)
Previously noted lung nodule, now doubled in size to 4cm.  Pulmonary consulted, recommended EBUS.  Was to have EBUS 5/3 but inadvertently given breakfast, procedure canceled.  Rescheduled as an outpatient. - EBUS on 5/16 - Oncology will arrange for outpatient PET/CT and follow up appointment

## 2022-06-03 NOTE — ED Notes (Signed)
Pacemaker interrogated. 

## 2022-06-03 NOTE — Assessment & Plan Note (Addendum)
-   Resume Xarelto - Continue amiodarone, back to oral - Continue metoprolol

## 2022-06-03 NOTE — Assessment & Plan Note (Addendum)
BP near controlled - Continue Imdur, metoprolol, Entresto - Start spironolactone

## 2022-06-03 NOTE — Subjective & Objective (Signed)
CC: chest pain, dizziness HPI: 77 year old male history of ventricular tachycardia status post VT ablation September 2022 status post ICD placement, known adrenal mass, hypertension, COPD, history of atrial fibrillation on anticoagulation, chronic combined heart failure, who presents to the ER with chest pain and dizziness for about a weeks duration.  He states that he feels very dizzy suddenly at times.  He has not had a discharge of his ICD.  Denies any fever or chills.  He states that he has been losing weight about 5 pounds in the last several weeks.  Patient states that he was told about this pulmonary nodule he had in September 2023.  He states that he did not want it worked up at that time.  Patient had his defibrillator interrogated this evening.  It shows he has had runs of ventricular tachycardia without shock.  CT chest was performed which shows increase in the previously known right lung mass.  Now measuring 4.1 x 4.1 cm.  Back in September 2023, this mass was 1.8 x 1.9 x 2.0 cm.  [facility] hospitalist consulted for his lung mass.  Patient states that he did not want this worked up back in 2023.  I told him that his lung mass has doubled in size since September 2023 and most likely this is a neoplastic process.  Patient states that he would never want chemotherapy because he knows it would make him sick.  He states that he would not want to know if he has cancer or not because this would make him extremely anxious.  He states he will think about workup of this lung mass after talking with his family but at this point he does not want any further investigation of this lung mass.  He understands that it is most likely a neoplastic process.

## 2022-06-03 NOTE — H&P (Addendum)
Cardiology HPI   Patient ID: Benjamin Cook MRN: [id number]; DOB: 17-Dec-1945  Admit date: 06/02/2022 Date of Consult: 06/03/2022  PCP:  [name], MD   [facility] Providers Cardiologist:  Olga Millers, MD  Electrophysiologist:  Hillis Range, MD (Inactive)  Electrophysiology APP:  Marily Lente, NP (Inactive)       Patient Profile:   Benjamin Cook is a 77 y.o. male with a hx of PAF, VT s/p ICD placement and ablation, chronic systolic HF, ICM, anemia, lung nodule who is being seen 06/03/2022 for the evaluation of lightheadedness and presyncope at the request of ER.  History of Present Illness:   Benjamin Cook has been feeling poorly over the past 2 weeks. He has been experiencing recurrent episodes of lightheadedness and presyncope which feels similar to his prior symptomatic VT. He denies chest pain, orthopnea or PND. Has baseline functional class III DOE. No ICD shocks. He was on chronic therapy with amiodarone but this was stopped in March. In the ER his device was interrogated and showed 3 episodes of VT (longest lasting 3 min) which terminated with ATP. Additionally multiple frequent non sustained VT as well as SVT labeled by the device. The majority of the EGMs are not available for evaluation.  Additionally, patient had a CT scan which showed progression of lung lesion measuring now 4.1 cm.   Past Medical History:  Diagnosis Date   Adrenal mass ([name])    per pt this is remote (10 years) and benign by biopsy   AICD (automatic cardioverter/defibrillator) present    Anxiety    Arthritis    CAD (coronary artery disease)    a. s/p PCI to LAD 2001 b. myoview 2014 high risk with scar LAD/RCA territory but no ischemia   Cardiomyopathy, ischemic    CHF (congestive heart failure) ([name])    class II/III   COPD (chronic obstructive pulmonary disease) ([name])    smokes cigars but has quit cigarettes   Defibrillator discharge 04/21/2019   Diverticulitis     Dyspnea    Flu 03/2015   HTN (hypertension)    Hyperlipidemia    NSVT (nonsustained ventricular tachycardia) ([name]) 03/05/2017   Paroxysmal atrial fibrillation ([name]) 03/2015   chads2vasc score is 4   PSA (psoriatic arthritis) ([name])    increased   PVD (peripheral vascular disease) ([name])     Past Surgical History:  Procedure Laterality Date   ABDOMINAL AORTIC ENDOVASCULAR STENT GRAFT N/A 03/03/2017   Procedure: ABDOMINAL AORTIC ENDOVASCULAR STENT GRAFT;  Surgeon: Maeola Harman, MD;  Location: [facility] OR;  Service: Vascular;  Laterality: N/A;   cataract surgery     CORONARY ANGIOGRAPHY N/A 10/12/2020   Procedure: CORONARY ANGIOGRAPHY;  Surgeon: Runell Gess, MD;  Location: [facility] INVASIVE CV LAB;  Service: Cardiovascular;  Laterality: N/A;   CORONARY ANGIOPLASTY WITH STENT PLACEMENT  2001   a. PCI to LAD   IMPLANTABLE CARDIOVERTER DEFIBRILLATOR (ICD) GENERATOR CHANGE N/A 03/18/2014   a. SJM Fortify ST DR ICD implanted by [facility] for primary prevention b. gen change 03/2014    LEAD REVISION/REPAIR N/A 07/17/2016   Procedure: Lead Revision/Repair;  Surgeon: Marinus Maw, MD;  Location: [facility] INVASIVE CV LAB;  Service: Cardiovascular;  Laterality: N/A;   LEFT HEART CATHETERIZATION WITH CORONARY ANGIOGRAM N/A 05/27/2014   Procedure: LEFT HEART CATHETERIZATION WITH CORONARY ANGIOGRAM;  Surgeon: Tonny Bollman, MD;  Location: [facility] CATH LAB;  Service: Cardiovascular;  Laterality: N/A;   REPAIR KNEE LIGAMENT  V TACH ABLATION N/A 10/26/2020   Procedure: V TACH ABLATION;  Surgeon: Marinus Maw, MD;  Location: [facility] INVASIVE CV LAB;  Service: Cardiovascular;  Laterality: N/A;     Home Medications:  Prior to Admission medications   Medication Sig Start Date End Date Taking? Authorizing Provider  acetaminophen (TYLENOL) 500 MG tablet Take 500-1,000 mg by mouth every 8 (eight) hours as needed (for pain).     [provider]  amiodarone (PACERONE) 200 MG tablet Take 1 tablet (200 mg total) by  mouth 2 (two) times daily for 30 days, THEN 1 tablet (200 mg total) daily. 02/26/22 02/21/23  Marinus Maw, MD  atorvastatin (LIPITOR) 80 MG tablet TAKE 1 TABLET BY MOUTH DAILY AT 6 PM 11/23/21   [name], MD  carvedilol (COREG) 6.25 MG tablet TAKE 1 TABLET BY MOUTH TWICE A DAY WITH MEALS 12/31/21   Marinus Maw, MD  feeding supplement (ENSURE ENLIVE / ENSURE PLUS) LIQD Try to drink 1 can 1-2 times a day. 06/04/20   Tereso Newcomer T, PA-C  Fluticasone-Salmeterol (ADVAIR) 100-50 MCG/DOSE AEPB Inhale 1 puff into the lungs 2 (two) times daily. Patient taking differently: Inhale 2 puffs into the lungs 2 (two) times daily. 01/07/20   Graciella Freer, PA-C  isosorbide mononitrate (IMDUR) 30 MG 24 hr tablet TAKE 1/2 OF A TABLET (15 MG TOTAL) BY MOUTH DAILY 12/18/21   [name], MD  melatonin 5 MG TABS Take 1 tablet (5 mg total) by mouth at bedtime. 10/16/20   Marinda Elk, MD  mometasone-formoterol (DULERA) 100-5 MCG/ACT AERO Inhale 2 puffs into the lungs in the morning and at bedtime. 01/31/22   Allred, Fayrene Fearing, MD  Multiple Vitamin (MULTIVITAMIN) capsule Take 1 capsule by mouth daily.    [provider]  nitroGLYCERIN (NITROSTAT) 0.4 MG SL tablet Place 1 tablet (0.4 mg total) under the tongue every 5 (five) minutes x 3 doses as needed for chest pain. 06/04/20 06/04/21  Tereso Newcomer T, PA-C  pantoprazole (PROTONIX) 40 MG tablet Take 1 tablet (40 mg total) by mouth daily. 10/16/20 10/16/21  Marinda Elk, MD  potassium chloride SA (KLOR-CON M20) 20 MEQ tablet TAKE 1 TABLET (20 MEQ TOTAL) BY MOUTH DAILY. PLEASE SCHEDULE APPOINTMENT FOR ADDITIONAL REFILLS. 05/20/22   Marinus Maw, MD  rivaroxaban (XARELTO) 20 MG TABS tablet Take 1 tablet (20 mg total) by mouth daily with supper. 05/08/22   [name], MD  rivaroxaban (XARELTO) 20 MG TABS tablet Take 1 tablet (20 mg total) by mouth daily with supper. 05/08/22   [name], MD  sacubitril-valsartan (ENTRESTO)  97-103 MG TAKE 1 TABLET BY MOUTH TWICE A DAY 05/21/22   [name], MD    Inpatient Medications: Scheduled Meds:  amiodarone  150 mg Intravenous Once   Continuous Infusions:  amiodarone     Followed by   amiodarone     PRN Meds:   Allergies:   No Known Allergies  Social History:   Social History   Socioeconomic History   Marital status: Married    Spouse name: Not on file   Number of children: Not on file   Years of education: Not on file   Highest education level: Not on file  Occupational History   Occupation: Lawn care  Tobacco Use   Smoking status: Every Day    Types: Cigars   Smokeless tobacco: Never   Tobacco comments:    smokes cigars now, no longer smokes cigarettes  but previously smoked 2ppd  Vaping Use   Vaping Use: Never used  Substance and Sexual Activity   Alcohol use: No   Drug use: No   Sexual activity: Not Currently    Partners: Female    Birth control/protection: None  Other Topics Concern   Not on file  Social History Narrative   Not on file   Social Determinants of Health   Financial Resource Strain: Not on file  Food Insecurity: Not on file  Transportation Needs: Not on file  Physical Activity: Not on file  Stress: Not on file  Social Connections: Not on file  Intimate Partner Violence: Not on file    Family History:   Family History  Problem Relation Age of Onset   Cirrhosis Mother        due to ETOH   Cancer Neg Hx      ROS:  Please see the history of present illness.  All other ROS reviewed and negative.     Physical Exam/Data:   Vitals:   06/03/22 0255 06/03/22 0300 06/03/22 0315 06/03/22 0330  BP: (!) 114/46 (!) 109/43 (!) 120/56 (!) 126/48  Pulse: (!) 34 (!) 56 (!) 52 71  Resp: (!) 29 (!) 24 (!) 24 (!) 26  Temp:      TempSrc:      SpO2: 99% 97% 96% 98%  Weight:      Height:        Intake/Output Summary (Last 24 hours) at 06/03/2022 0400 Last data filed at 06/02/2022 2152 Gross per 24 hour  Intake 500  ml  Output --  Net 500 ml      06/02/2022   10:00 PM 02/08/2022   10:36 AM 01/22/2022    8:42 AM  Last 3 Weights  Weight (lbs) 145 lb 147 lb 147 lb  Weight (kg) 65.772 kg 66.679 kg 66.679 kg     Body mass index is 20.22 kg/m.  General:  Well nourished, well developed, in no acute distress HEENT: normal Neck: no JVD Vascular: No carotid bruits; Distal pulses 2+ bilaterally Cardiac:  normal S1, S2; RRR; no murmur  Lungs:  clear to auscultation bilaterally, no wheezing, rhonchi or rales  Abd: soft, nontender, no hepatomegaly  Ext: no edema Musculoskeletal:  No deformities, BUE and BLE strength normal and equal Skin: warm and dry  Neuro:  CNs 2-12 intact, no focal abnormalities noted Psych:  Normal affect   EKG:  The EKG was personally reviewed and demonstrates:  NSR, NSVT Telemetry:  Telemetry was personally reviewed and demonstrates:  salvos of NSVT  Relevant CV Studies:   Laboratory Data:  High Sensitivity Troponin:   Recent Labs  Lab 06/02/22 2306 06/03/22 0256  TROPONINIHS 19* 17     Chemistry Recent Labs  Lab 06/02/22 2306  NA 137  K 4.0  CL 105  CO2 23  GLUCOSE 100*  BUN 7*  CREATININE 0.97  CALCIUM 8.1*  GFRNONAA >60  ANIONGAP 9    No results for input(s): "PROT", "ALBUMIN", "AST", "ALT", "ALKPHOS", "BILITOT" in the last 168 hours. Lipids No results for input(s): "CHOL", "TRIG", "HDL", "LABVLDL", "LDLCALC", "CHOLHDL" in the last 168 hours.  Hematology Recent Labs  Lab 06/02/22 2306  WBC 6.8  RBC 3.91*  HGB 9.8*  HCT 30.5*  MCV 78.0*  MCH 25.1*  MCHC 32.1  RDW 19.1*  PLT 297   Thyroid No results for input(s): "TSH", "FREET4" in the last 168 hours.  BNPNo results for input(s): "BNP", "PROBNP" in the  last 168 hours.  DDimer No results for input(s): "DDIMER" in the last 168 hours.   Radiology/Studies:  CT Chest W Contrast  Result Date: 06/03/2022 CLINICAL DATA:  Pleural effusion, malignancy suspected. EXAM: CT CHEST WITH CONTRAST  TECHNIQUE: Multidetector CT imaging of the chest was performed during intravenous contrast administration. RADIATION DOSE REDUCTION: This exam was performed according to the departmental dose-optimization program which includes automated exposure control, adjustment of the mA and/or kV according to patient size and/or use of iterative reconstruction technique. CONTRAST:  50mL OMNIPAQUE IOHEXOL 350 MG/ML SOLN COMPARISON:  10/13/2021. FINDINGS: Cardiovascular: The heart is enlarged and there is a small pericardial effusion. Pacemaker leads are noted in the heart. Three-vessel coronary artery calcifications are noted. There is atherosclerotic calcification of the aorta without evidence of aneurysm. The pulmonary trunk is normal in caliber. Mediastinum/Nodes: Prominent lymph node is present in the subcarinal space measuring 1 cm. No hilar or axillary lymphadenopathy is seen. The thyroid gland, trachea, and esophagus are within normal limits. There is a small hiatal hernia. Lungs/Pleura: Paraseptal and centrilobular emphysematous changes are present in the lungs. Debris is present in the left mainstem bronchus. There is bronchial wall thickening bilaterally with multifocal mucous plugging in the bilateral lower lobes, greater on the left than on the right. Atelectasis is noted at the lung bases. There are trace bilateral pleural effusions. Trace bilateral pleural effusions are noted. No pneumothorax. There is redemonstration of a mass in the suprahilar region on the right measuring 4.1 x 4.1 cm, increased in size from the prior exam. Pleural and parenchymal scarring is noted bilaterally. Upper Abdomen: There is a 4.1 cm mass in the right adrenal gland containing calcifications, unchanged from 2009. Cysts are present in the right kidney. Subcentimeter hypodensities are present in the left kidney which are too small to further characterize. An aortic endograft is noted in the upper abdomen. No acute abnormality.  Musculoskeletal: A pacemaker device is present in the anterior chest wall on the left. Degenerative changes are present in the thoracic spine. No acute or suspicious osseous abnormality. IMPRESSION: 1. Interval enlargement of suprahilar mass on the right now measuring 4.1 x 4.1 cm, suspicious for neoplasm. PET-CT or biopsy is recommended for further evaluation. 2. Bilateral bronchial wall thickening with multifocal mucous plugging in the lower lobes bilaterally, greater on the left than on the right. 3. Emphysema. 4. Aortic atherosclerosis and coronary artery calcifications. Electronically Signed   By: Thornell Sartorius M.D.   On: 06/03/2022 01:28   DG Chest 2 View  Result Date: 06/02/2022 CLINICAL DATA:  Chest pain EXAM: CHEST - 2 VIEW COMPARISON:  01/22/2022 FINDINGS: Cardiac shadow is within normal limits. Defibrillator is again noted. Aortic calcifications are seen. Mild scarring is noted in the right apex stable from the prior exam. Fullness in the right hilar region is noted which corresponds to a soft tissue density seen on prior CT examination from 10/13/2021. This may represent enlarging right hilar mass. No bony abnormality is noted. No other focal abnormality is seen. IMPRESSION: Prominent right hilum with focal mass lesion on the lateral projection similar to that seen on prior CT examination. Again this is suspicious for neoplasm. Follow-up CT is recommended for further evaluation. Electronically Signed   By: Alcide Clever M.D.   On: 06/02/2022 23:32     Assessment and Plan:   77 y/o with hx of VT s/p ablation in 2022, s/p ICD placement, ICM, multivessel CAD (patient deemed non CABG candidate), HFrEF 35%, anemia and lung nodule  who was admitted with presyncope.  #Presyncope #Recurrent episodes of VT #VT s/p ablation in 2022 #S/p ICD placement - Frequent salvos of non sustained VT in the ER - 3 documented episodes in the past 24 hrs per device interrogation, that terminated with ATP - On  amiodarone in the past and now discontinued Plan: - Given frequent salvos and 3 episodes of VT requiring therapy in the last 24 hrs (longest episode lasting 3 min) will keep him on IV amiodarone overnight - Keep K>4 and Mg>2 - EP consultation in am  #Non valvular afib: - Hold xarelto for now in case patient is agreeable to undergo lung mass workup  #Multivessel CAD #HFrEF from ICM (35%) - Considered non cabg candidate in 2022 - No angina - Baseline NYHA III - Euvolemic on exam Plan: - Continue Coreg 6.25 BID - Cotninue Entresto 97-103 BID  #Lung mass - IM coordinating care  Risk Assessment/Risk Scores:      For questions or updates, please contact [facility] Please consult [email or web address] for contact info under    Signed, Regan Rakers, MD  06/03/2022 4:00 AM

## 2022-06-03 NOTE — ED Notes (Signed)
Report given to Neldon Newport, RN of [phone]

## 2022-06-03 NOTE — Care Plan (Addendum)
This 77 years old Male with PMH significant for ventricular tachycardia s/p VT ablation in September 2022, s/p ICD placement, known adrenal mass, hypertension, COPD, history of atrial fibrillation on anticoagulation, chronic combined heart failure presented in the ED with complaints of chest pain and dizziness for about a week. He denies any discharge of his AICD.  He states he has been losing weight for about 5 pounds in the last several weeks.  He reports he was told that he has a pulmonary nodule in September 2023 and he did not want workup at that time. CT chest showed an increase in the previously known right lung mass. Patient states he did not want this worked up in 2023.  Patient states he would not want to know if he has a cancer or not because this would make him extremely anxious.  Patient states he will talk to his family and discuss and will decide about the workup.  Patient was evaluated by electrophysiologist.  AICD is interrogated.  Seen and examined at bedside.  Assessment and plan:  Mass of right lung: Patient has pulmonary nodule diagnosed in September 2023. He did not want workup for that mass at the time.  It is about doubled in size in the last 7 months.   His PCP has also explained to him about it.  Patient still feels he does not want to know if it is cancer or not.   He states that he would feel very anxious if he knew he had cancer.  He states that he would never want to have chemotherapy because he knows that it would make him very ill.  He request no further workup of his lung mass.  He states that he will talk to his family and tell the medical team if he changes his mind.   VT (ventricular tachycardia) ([name]) Patient was evaluated by cardiology. Continue IV amiodarone.  Further management per cardiology team.   Essential hypertension Continue his Coreg, Imdur   ICD (implantable cardioverter-defibrillator) in place Chronic.   HFrEF (heart failure with reduced ejection  fraction) ([name]) .  Continue his Coreg, Entresto.   Paroxysmal atrial fibrillation ([name]) Continue Xarelto.

## 2022-06-03 NOTE — ED Notes (Signed)
ED TO INPATIENT HANDOFF REPORT  S Name/Age/Gender Benjamin Cook 77 y.o. male Room/Bed: TRACC/TRACC  Code Status   Code Status: DNR  Home/SNF/Other Home Patient oriented to: self, place, time, and situation Is this baseline? Yes   Triage Complete: Triage complete  Chief Complaint Ventricular tachyarrhythmia ([name]) [I47.20]  Triage Note No notes on file   Allergies No Known Allergies  Level of Care/Admitting Diagnosis ED Disposition     ED Disposition  Admit   Condition  --   Comment  Hospital Area: [facility] [[id number]]  Level of Care: Telemetry Cardiac [[id number]]  May place patient in observation at [facility] or [facility] if equivalent level of care is available:: Yes  Covid Evaluation: Asymptomatic - no recent exposure (last 10 days) testing not required  Diagnosis: Ventricular tachyarrhythmia [facility]) [[id number]]  Admitting Physician: Regan Rakers [[id number]]  Attending Physician: Regan Rakers [[id number]]          B Medical/Surgery History Past Medical History:  Diagnosis Date   Adrenal mass ([name])    per pt this is remote (10 years) and benign by biopsy   AICD (automatic cardioverter/defibrillator) present    Anxiety    Arthritis    CAD (coronary artery disease)    a. s/p PCI to LAD 2001 b. myoview 2014 high risk with scar LAD/RCA territory but no ischemia   Cardiomyopathy, ischemic    CHF (congestive heart failure) ([name])    class II/III   COPD (chronic obstructive pulmonary disease) ([name])    smokes cigars but has quit cigarettes   Defibrillator discharge 04/21/2019   Diverticulitis    Dyspnea    Flu 03/2015   HTN (hypertension)    Hyperlipidemia    NSVT (nonsustained ventricular tachycardia) ([name]) 03/05/2017   Paroxysmal atrial fibrillation ([name]) 03/2015   chads2vasc score is 4   PSA (psoriatic arthritis) ([name])    increased   PVD (peripheral vascular disease) ([name])    Past Surgical History:  Procedure  Laterality Date   ABDOMINAL AORTIC ENDOVASCULAR STENT GRAFT N/A 03/03/2017   Procedure: ABDOMINAL AORTIC ENDOVASCULAR STENT GRAFT;  Surgeon: Maeola Harman, MD;  Location: [facility] OR;  Service: Vascular;  Laterality: N/A;   cataract surgery     CORONARY ANGIOGRAPHY N/A 10/12/2020   Procedure: CORONARY ANGIOGRAPHY;  Surgeon: Runell Gess, MD;  [facility]: [facility] INVASIVE CV LAB;  Service: Cardiovascular;  Laterality: N/A;   CORONARY ANGIOPLASTY WITH STENT PLACEMENT  2001   a. PCI to LAD   IMPLANTABLE CARDIOVERTER DEFIBRILLATOR (ICD) GENERATOR CHANGE N/A 03/18/2014   a. SJM Fortify ST DR ICD implanted by [facility] for primary prevention b. gen change 03/2014    LEAD REVISION/REPAIR N/A 07/17/2016   Procedure: Lead Revision/Repair;  Surgeon: Marinus Maw, MD;  Location: [facility] INVASIVE CV LAB;  Service: Cardiovascular;  Laterality: N/A;   LEFT HEART CATHETERIZATION WITH CORONARY ANGIOGRAM N/A 05/27/2014   Procedure: LEFT HEART CATHETERIZATION WITH CORONARY ANGIOGRAM;  Surgeon: Tonny Bollman, MD;  Location: [facility] CATH LAB;  Service: Cardiovascular;  Laterality: N/A;   REPAIR KNEE LIGAMENT     V TACH ABLATION N/A 10/26/2020   Procedure: V TACH ABLATION;  Surgeon: Marinus Maw, MD;  Location: [facility] INVASIVE CV LAB;  Service: Cardiovascular;  Laterality: N/A;     A IV Location/Drains/Wounds Patient Lines/Drains/Airways Status     Active Line/Drains/Airways     Name Placement date Placement time Site Days   Peripheral IV 06/03/22 20 G 1" Anterior;Left;Proximal Forearm 06/03/22  0431  Forearm  less than 1            Intake/Output Last 24 hours  Intake/Output Summary (Last 24 hours) at 06/03/2022 0533 Last data filed at 06/02/2022 2152 Gross per 24 hour  Intake 500 ml  Output --  Net 500 ml    Labs/Imaging Results for orders placed or performed during the hospital encounter of 06/02/22 (from the past 48 hour(s))  Basic metabolic panel     Status: Abnormal   Collection Time: 06/02/22 11:06 PM   Result Value Ref Range   Sodium 137 135 - 145 mmol/L   Potassium 4.0 3.5 - 5.1 mmol/L   Chloride 105 98 - 111 mmol/L   CO2 23 22 - 32 mmol/L   Glucose, Bld 100 (H) 70 - 99 mg/dL    Comment: Glucose reference range applies only to samples taken after fasting for at least 8 hours.   BUN 7 (L) 8 - 23 mg/dL   Creatinine, Ser 1.61 0.61 - 1.24 mg/dL   Calcium 8.1 (L) 8.9 - 10.3 mg/dL   GFR, Estimated >09 >60 mL/min    Comment: (NOTE) Calculated using the CKD-EPI Creatinine Equation (2021)    Anion gap 9 5 - 15    Comment: Performed at [facility], [address], [place], [place] [address]  CBC     Status: Abnormal   Collection Time: 06/02/22 11:06 PM  Result Value Ref Range   WBC 6.8 4.0 - 10.5 K/uL   RBC 3.91 (L) 4.22 - 5.81 MIL/uL   Hemoglobin 9.8 (L) 13.0 - 17.0 g/dL   HCT 81.1 (L) 91.4 - 78.2 %   MCV 78.0 (L) 80.0 - 100.0 fL   MCH 25.1 (L) 26.0 - 34.0 pg   MCHC 32.1 30.0 - 36.0 g/dL   RDW 95.6 (H) 21.3 - 08.6 %   Platelets 297 150 - 400 K/uL   nRBC 0.0 0.0 - 0.2 %    Comment: Performed at [facility], [address], [place], [place] [address]  Troponin I (High Sensitivity)     Status: Abnormal   Collection Time: 06/02/22 11:06 PM  Result Value Ref Range   Troponin I (High Sensitivity) 19 (H) <18 ng/L    Comment: (NOTE) Elevated high sensitivity troponin I (hsTnI) values and significant  changes across serial measurements may suggest ACS but many other  chronic and acute conditions are known to elevate hsTnI results.  Refer to the "Links" section for chest pain algorithms and additional  guidance. Performed at [facility], [address] [place], [place] [address]   POC occult blood, ED     Status: None   Collection Time: 06/03/22  2:50 AM  Result Value Ref Range   Fecal Occult Bld NEGATIVE NEGATIVE  Troponin I (High Sensitivity)     Status: None   Collection Time: 06/03/22  2:56 AM  Result Value Ref Range   Troponin I (High Sensitivity) 17 <18  ng/L    Comment: (NOTE) Elevated high sensitivity troponin I (hsTnI) values and significant  changes across serial measurements may suggest ACS but many other  chronic and acute conditions are known to elevate hsTnI results.  Refer to the "Links" section for chest pain algorithms and additional  guidance. Performed at [facility], [address], [place], [place] [address]    CT Chest W Contrast  Result Date: 06/03/2022 CLINICAL DATA:  Pleural effusion, malignancy suspected. EXAM: CT CHEST WITH CONTRAST  TECHNIQUE: Multidetector CT imaging of the chest was performed during intravenous contrast administration. RADIATION DOSE REDUCTION: This exam was performed according to the departmental dose-optimization program which includes automated exposure control, adjustment of the mA and/or kV according to patient size and/or use of iterative reconstruction technique. CONTRAST:  50mL OMNIPAQUE IOHEXOL 350 MG/ML SOLN COMPARISON:  10/13/2021. FINDINGS: Cardiovascular: The heart is enlarged and there is a small pericardial effusion. Pacemaker leads are noted in the heart. Three-vessel coronary artery calcifications are noted. There is atherosclerotic calcification of the aorta without evidence of aneurysm. The pulmonary trunk is normal in caliber. Mediastinum/Nodes: Prominent lymph node is present in the subcarinal space measuring 1 cm. No hilar or axillary lymphadenopathy is seen. The thyroid gland, trachea, and esophagus are within normal limits. There is a small hiatal hernia. Lungs/Pleura: Paraseptal and centrilobular emphysematous changes are present in the lungs. Debris is present in the left mainstem bronchus. There is bronchial wall thickening bilaterally with multifocal mucous plugging in the bilateral lower lobes, greater on the left than on the right. Atelectasis is noted at the lung bases. There are trace bilateral pleural effusions. Trace bilateral pleural effusions are noted. No pneumothorax.  There is redemonstration of a mass in the suprahilar region on the right measuring 4.1 x 4.1 cm, increased in size from the prior exam. Pleural and parenchymal scarring is noted bilaterally. Upper Abdomen: There is a 4.1 cm mass in the right adrenal gland containing calcifications, unchanged from 2009. Cysts are present in the right kidney. Subcentimeter hypodensities are present in the left kidney which are too small to further characterize. An aortic endograft is noted in the upper abdomen. No acute abnormality. Musculoskeletal: A pacemaker device is present in the anterior chest wall on the left. Degenerative changes are present in the thoracic spine. No acute or suspicious osseous abnormality. IMPRESSION: 1. Interval enlargement of suprahilar mass on the right now measuring 4.1 x 4.1 cm, suspicious for neoplasm. PET-CT or biopsy is recommended for further evaluation. 2. Bilateral bronchial wall thickening with multifocal mucous plugging in the lower lobes bilaterally, greater on the left than on the right. 3. Emphysema. 4. Aortic atherosclerosis and coronary artery calcifications. Electronically Signed   By: Thornell Sartorius M.D.   On: 06/03/2022 01:28   DG Chest 2 View  Result Date: 06/02/2022 CLINICAL DATA:  Chest pain EXAM: CHEST - 2 VIEW COMPARISON:  01/22/2022 FINDINGS: Cardiac shadow is within normal limits. Defibrillator is again noted. Aortic calcifications are seen. Mild scarring is noted in the right apex stable from the prior exam. Fullness in the right hilar region is noted which corresponds to a soft tissue density seen on prior CT examination from 10/13/2021. This may represent enlarging right hilar mass. No bony abnormality is noted. No other focal abnormality is seen. IMPRESSION: Prominent right hilum with focal mass lesion on the lateral projection similar to that seen on prior CT examination. Again this is suspicious for neoplasm. Follow-up CT is recommended for further evaluation.  Electronically Signed   By: Alcide Clever M.D.   On: 06/02/2022 23:32    Pending Labs Unresulted Labs (From admission, onward)     Start     Ordered   06/03/22 0505  Comprehensive metabolic panel  Once,   R        06/03/22 0506   06/03/22 0505  CBC with Differential/Platelet  Once,   R        06/03/22 0506   06/03/22 0500  Lipoprotein A (LPA)  Tomorrow morning,  R        06/03/22 0506            Vitals/Pain Today's Vitals   06/03/22 0431 06/03/22 0445 06/03/22 0500 06/03/22 0515  BP: (!) 123/56 (!) 135/51 131/64 113/63  Pulse: (!) 36 68 63 (!) 37  Resp: (!) 22 18 (!) 21 (!) 26  Temp:      TempSrc:      SpO2: 99% 97% 98% 100%  Weight:      Height:      PainSc:        Isolation Precautions No active isolations  Medications Medications  amiodarone (NEXTERONE) 1.8 mg/mL load via infusion 150 mg (150 mg Intravenous Bolus from Bag 06/03/22 0418)    Followed by  amiodarone (NEXTERONE PREMIX) 360-4.14 MG/200ML-% (1.8 mg/mL) IV infusion (60 mg/hr Intravenous New Bag/Given 06/03/22 0418)    Followed by  amiodarone (NEXTERONE PREMIX) 360-4.14 MG/200ML-% (1.8 mg/mL) IV infusion (has no administration in time range)  atorvastatin (LIPITOR) tablet 80 mg (has no administration in time range)  carvedilol (COREG) tablet 6.25 mg (has no administration in time range)  isosorbide mononitrate (IMDUR) 24 hr tablet 15 mg (has no administration in time range)  mometasone-formoterol (DULERA) 100-5 MCG/ACT inhaler 2 puff (has no administration in time range)  sacubitril-valsartan (ENTRESTO) 97-103 mg per tablet (has no administration in time range)  nitroGLYCERIN (NITROSTAT) SL tablet 0.4 mg (has no administration in time range)  acetaminophen (TYLENOL) tablet 650 mg (has no administration in time range)  ondansetron (ZOFRAN) injection 4 mg (has no administration in time range)  iohexol (OMNIPAQUE) 350 MG/ML injection 50 mL (50 mLs Intravenous Contrast Given 06/03/22 0102)     Mobility walks     Focused Assessments Cardiac Assessment Handoff:    Lab Results  Component Value Date   CKTOTAL 144 05/26/2010   CKMB 2.4 05/26/2010   TROPONINI (H) 05/26/2010    0.07        PERSISTENTLY INCREASED TROPONIN VALUES IN THE RANGE OF 0.06-0.49 ng/mL CAN BE SEEN IN:       -UNSTABLE ANGINA       -CONGESTIVE HEART FAILURE       -MYOCARDITIS       -CHEST TRAUMA       -ARRYHTHMIAS       -LATE PRESENTING MI       -COPD   CLINICAL FOLLOW-UP RECOMMENDED.   Lab Results  Component Value Date   DDIMER (H) 06/03/2007    1.59        AT THE INHOUSE ESTABLISHED CUTOFF VALUE OF 0.48 ug/mL FEU, THIS ASSAY HAS BEEN DOCUMENTED IN THE LITERATURE TO HAVE   Does the Patient currently have chest pain? No    R Recommendations: See Admitting Provider Note  Report given to: Neldon Newport, RN of [phone]

## 2022-06-04 DIAGNOSIS — I472 Ventricular tachycardia, unspecified: Secondary | ICD-10-CM | POA: Diagnosis not present

## 2022-06-04 LAB — LIPOPROTEIN A (LPA): Lipoprotein (a): 54 nmol/L — ABNORMAL HIGH (ref <75.0)

## 2022-06-04 LAB — BASIC METABOLIC PANEL
Anion gap: 6 (ref 5–15)
BUN: <5 mg/dL — ABNORMAL LOW (ref 8–23)
CO2: 23 mmol/L (ref 22–32)
Calcium: 8.1 mg/dL — ABNORMAL LOW (ref 8.9–10.3)
Chloride: 107 mmol/L (ref 98–111)
Creatinine, Ser: 0.88 mg/dL (ref 0.61–1.24)
GFR, Estimated: >60 mL/min (ref >60)
Glucose, Bld: 99 mg/dL (ref 70–99)
Potassium: 3.6 mmol/L (ref 3.5–5.1)
Sodium: 136 mmol/L (ref 135–145)

## 2022-06-04 LAB — MAGNESIUM: Magnesium: 1.8 mg/dL (ref 1.7–2.4)

## 2022-06-04 MED ORDER — IPRATROPIUM-ALBUTEROL 0.5-2.5 (3) MG/3ML IN SOLN: 3.0000 mL | RESPIRATORY_TRACT | Status: DC | PRN

## 2022-06-04 MED ORDER — METOPROLOL SUCCINATE ER 50 MG PO TB24
50.0000 mg | ORAL_TABLET | Freq: Two times a day (BID) | ORAL | Status: DC
  Administered 2022-06-04 – 2022-06-08 (×9): 50 mg via ORAL
  Filled 2022-06-04 (×9): qty 1

## 2022-06-04 MED ORDER — MAGNESIUM SULFATE 4 GM/100ML IV SOLN
4.0000 g | Freq: Once | INTRAVENOUS | Status: AC
  Administered 2022-06-04: 4 g via INTRAVENOUS
  Filled 2022-06-04: qty 100

## 2022-06-04 MED ORDER — POTASSIUM CHLORIDE CRYS ER 20 MEQ PO TBCR
40.0000 meq | EXTENDED_RELEASE_TABLET | Freq: Once | ORAL | Status: AC
  Administered 2022-06-04: 40 meq via ORAL
  Filled 2022-06-04: qty 2

## 2022-06-04 NOTE — Consult Note (Signed)
NAME:  Benjamin Cook, MRN:  [id number], DOB:  25-Dec-1945, LOS: 1 ADMISSION DATE:  06/02/2022, CONSULTATION DATE:  4/30 REFERRING MD:  Dr. Idelle Leech, CHIEF COMPLAINT:  Lung mass   History of Present Illness:  Patient is a 77 yo M w/ pertinent PMH VT s/p ablation 2022 s/p icd placement, HTN, Afib on AC, HFrEF, adrenal mass, COPD, tobacco abuse presents to [facility] w/ chest pain.  Patient was recently told about a 2.1 cm lung nodule on September 2023 but deferred further work up. Patient states he has been having chest pain w/ dizziness over the past week. Also having presyncope. Denies any fevers/chills. States he has lost 5 pounds in the last few weeks as he hasn't had an appetite. Denies any night sweats but had an isolated episode of hemoptysis several months ago.   On 4/28 patient came to [facility] ED. Cards consulted. Defibrillator interrogated showing 3 runs of vtach and was admitted for further work-up.   Pt reports about a 60 pack yr history of smoking, but quit about three years ago  He takes Xarelto which was held on admission   Pertinent  Medical History   Past Medical History:  Diagnosis Date   Adrenal mass ([name])    per pt this is remote (10 years) and benign by biopsy   AICD (automatic cardioverter/defibrillator) present    Anxiety    Arthritis    CAD (coronary artery disease)    a. s/p PCI to LAD 2001 b. myoview 2014 high risk with scar LAD/RCA territory but no ischemia   Cardiomyopathy, ischemic    CHF (congestive heart failure) ([name])    class II/III   COPD (chronic obstructive pulmonary disease) ([name])    smokes cigars but has quit cigarettes   Defibrillator discharge 04/21/2019   Diverticulitis    Dyspnea    Flu 03/2015   HTN (hypertension)    Hyperlipidemia    NSVT (nonsustained ventricular tachycardia) ([name]) 03/05/2017   Paroxysmal atrial fibrillation ([name]) 03/2015   chads2vasc score is 4   PSA (psoriatic arthritis) ([name])    increased   PVD (peripheral vascular disease)  ([name])      Significant Hospital Events: Including procedures, antibiotic start and stop dates in addition to other pertinent events   4/30 pccm consulted for lung mass work up  Interim History / Subjective:  No further chest pain   Objective   Blood pressure (!) 133/42, pulse 76, temperature 98 F (36.7 C), temperature source Oral, resp. rate 19, height 5\' 11"  (1.803 m), weight 65.8 kg, SpO2 99 %.        Intake/Output Summary (Last 24 hours) at 06/04/2022 1051 Last data filed at 06/04/2022 2536 Gross per 24 hour  Intake 811.59 ml  Output 925 ml  Net -113.41 ml   Filed Weights   06/02/22 2200  Weight: 65.8 kg    General:  well-nourished elderly M, resting in bed in no acute distress HEENT: MM pink/moist, sclera anicteric  Neuro: awake, alert and oriented  CV: s1s2 rrr, no m/r/g PULM:  clear bilaterally  GI: soft, bsx4 active  Extremities: warm/dry, No edema  Skin: no rashes or lesions   Resolved Hospital Problem list     Assessment & Plan:    RUL Lung Mass History of Tobacco abuse Ct chest 4.1 x 4.1 cm suprahilar mass -Pulm nodule diagnosed September 2023 but deferred further workup; now increased in size  -Pt was initially hesitant to pursue further work-up, he is still not sure whether  he would want chemo or radiation but would like to proceed with EBUS to understand what his treatment options are and would like to have this done as soon as possible pending clearance from cardiology -continue to hold Xarelto   COPD Multifocal mucous plugging L>R -pulm toiletry: IS/Flutter/pt/ot -on home dulera -prn duoneb for wheezing  Presyncope  Recurrent VT VT s/p ablation 2022 HTN CAD ICD in place HFrEF from ICM: EF 35% Paroxysmal atrial fibrillation on xarelto P: -per cards and EP  Best Practice (right click and "Reselect all SmartList Selections" daily)   Per primary  Labs   CBC: Recent Labs  Lab 06/02/22 2306 06/03/22 0700  WBC 6.8 7.0  NEUTROABS   --  4.8  HGB 9.8* 10.2*  HCT 30.5* 31.2*  MCV 78.0* 76.5*  PLT 297 301    Basic Metabolic Panel: Recent Labs  Lab 06/02/22 2306 06/03/22 0700 06/03/22 1406 06/04/22 0235  NA 137 137  --  136  K 4.0 3.9  --  3.6  CL 105 105  --  107  CO2 23 22  --  23  GLUCOSE 100* 111*  --  99  BUN 7* 6*  --  <5*  CREATININE 0.97 0.93  --  0.88  CALCIUM 8.1* 8.4*  --  8.1*  MG  --   --  1.8 1.8   GFR: Estimated Creatinine Clearance: 64.4 mL/min (by C-G formula based on SCr of 0.88 mg/dL). Recent Labs  Lab 06/02/22 2306 06/03/22 0700  WBC 6.8 7.0    Liver Function Tests: Recent Labs  Lab 06/03/22 0700  AST 20  ALT 14  ALKPHOS 51  BILITOT 0.5  PROT 6.6  ALBUMIN 2.2*   No results for input(s): "LIPASE", "AMYLASE" in the last 168 hours. No results for input(s): "AMMONIA" in the last 168 hours.  ABG    Component Value Date/Time   PHART 7.432 03/28/2015 1702   PCO2ART 36.8 03/28/2015 1702   PO2ART 48.0 (L) 03/28/2015 1702   HCO3 23.6 01/22/2022 1040   TCO2 25 01/22/2022 1040   O2SAT 96 01/22/2022 1040     Coagulation Profile: No results for input(s): "INR", "PROTIME" in the last 168 hours.  Cardiac Enzymes: No results for input(s): "CKTOTAL", "CKMB", "CKMBINDEX", "TROPONINI" in the last 168 hours.  HbA1C: Hgb A1c MFr Bld  Date/Time Value Ref Range Status  08/08/2017 09:04 AM 6.0 4.6 - 6.5 % Final    Comment:    Glycemic Control Guidelines for People with Diabetes:Non Diabetic:  <6%Goal of Therapy: <7%Additional Action Suggested:  >8%   12/09/2016 11:00 AM 6.4 4.6 - 6.5 % Final    Comment:    Glycemic Control Guidelines for People with Diabetes:Non Diabetic:  <6%Goal of Therapy: <7%Additional Action Suggested:  >8%     CBG: No results for input(s): "GLUCAP" in the last 168 hours.  Review of Systems:   Please see the history of present illness. All other systems reviewed and are negative    Past Medical History:  He,  has a past medical history of Adrenal  mass ([name]), AICD (automatic cardioverter/defibrillator) present, Anxiety, Arthritis, CAD (coronary artery disease), Cardiomyopathy, ischemic, CHF (congestive heart failure) ([name]), COPD (chronic obstructive pulmonary disease) ([name]), Defibrillator discharge (04/21/2019), Diverticulitis, Dyspnea, Flu (03/2015), HTN (hypertension), Hyperlipidemia, NSVT (nonsustained ventricular tachycardia) ([name]) (03/05/2017), Paroxysmal atrial fibrillation ([name]) (03/2015), PSA (psoriatic arthritis) ([name]), and PVD (peripheral vascular disease) ([name]).   Surgical History:   Past Surgical History:  Procedure Laterality Date   ABDOMINAL AORTIC ENDOVASCULAR STENT  GRAFT N/A 03/03/2017   Procedure: ABDOMINAL AORTIC ENDOVASCULAR STENT GRAFT;  Surgeon: Maeola Harman, MD;  Location: [facility] OR;  Service: Vascular;  Laterality: N/A;   cataract surgery     CORONARY ANGIOGRAPHY N/A 10/12/2020   Procedure: CORONARY ANGIOGRAPHY;  Surgeon: Runell Gess, MD;  [facility] INVASIVE CV LAB;  Service: Cardiovascular;  Laterality: N/A;   CORONARY ANGIOPLASTY WITH STENT PLACEMENT  2001   a. PCI to LAD   IMPLANTABLE CARDIOVERTER DEFIBRILLATOR (ICD) GENERATOR CHANGE N/A 03/18/2014   a. SJM Fortify ST DR ICD implanted by [facility] for primary prevention b. gen change 03/2014    LEAD REVISION/REPAIR N/A 07/17/2016   Procedure: Lead Revision/Repair;  Surgeon: Marinus Maw, MD;  [facility] INVASIVE CV LAB;  Service: Cardiovascular;  Laterality: N/A;   LEFT HEART CATHETERIZATION WITH CORONARY ANGIOGRAM N/A 05/27/2014   Procedure: LEFT HEART CATHETERIZATION WITH CORONARY ANGIOGRAM;  Surgeon: Tonny Bollman, MD;  Location: [facility] CATH LAB;  Service: Cardiovascular;  Laterality: N/A;   REPAIR KNEE LIGAMENT     V TACH ABLATION N/A 10/26/2020   Procedure: V TACH ABLATION;  Surgeon: Marinus Maw, MD;  Location: [facility] INVASIVE CV LAB;  Service: Cardiovascular;  Laterality: N/A;     Social History:   reports that he has been smoking cigars. He has never  used smokeless tobacco. He reports that he does not drink alcohol and does not use drugs.   Family History:  His family history includes Cirrhosis in his mother. There is no history of Cancer.   Allergies No Known Allergies   Home Medications  Prior to Admission medications   Medication Sig Start Date End Date Taking? Authorizing Provider  acetaminophen (TYLENOL) 500 MG tablet Take 500-1,000 mg by mouth every 8 (eight) hours as needed (for pain).    Yes [provider]  atorvastatin (LIPITOR) 80 MG tablet TAKE 1 TABLET BY MOUTH DAILY AT 6 PM Patient taking differently: Take 80 mg by mouth daily. TAKE 1 TABLET BY MOUTH  DAILY AT 6 PM 11/23/21  Yes Crenshaw, Madolyn Frieze, MD  carvedilol (COREG) 6.25 MG tablet TAKE 1 TABLET BY MOUTH TWICE A DAY WITH MEALS Patient taking differently: Take 6.25 mg by mouth 2 (two) times daily with a meal. 12/31/21  Yes Marinus Maw, MD  isosorbide mononitrate (IMDUR) 30 MG 24 hr tablet TAKE 1/2 OF A TABLET (15 MG TOTAL) BY MOUTH DAILY Patient taking differently: Take 30 mg by mouth daily. 12/18/21  Yes [name], MD  melatonin 5 MG TABS Take 1 tablet (5 mg total) by mouth at bedtime. 10/16/20  Yes Marinda Elk, MD  mometasone-formoterol (DULERA) 100-5 MCG/ACT AERO Inhale 2 puffs into the lungs in the morning and at bedtime. 01/31/22  Yes Allred, Fayrene Fearing, MD  Multiple Vitamin (MULTIVITAMIN) capsule Take 1 capsule by mouth daily.   Yes [provider]  potassium chloride SA (KLOR-CON M20) 20 MEQ tablet TAKE 1 TABLET (20 MEQ TOTAL) BY MOUTH DAILY. PLEASE SCHEDULE APPOINTMENT FOR ADDITIONAL REFILLS. Patient taking differently: Take 20 mEq by mouth daily. 05/20/22  Yes Marinus Maw, MD  rivaroxaban (XARELTO) 20 MG TABS tablet Take 1 tablet (20 mg total) by mouth daily with supper. 05/08/22  Yes Crenshaw, Madolyn Frieze, MD  sacubitril-valsartan (ENTRESTO) 97-103 MG TAKE 1 TABLET BY MOUTH TWICE A DAY Patient taking differently: Take 1 tablet by  mouth 2 (two) times daily. 05/21/22  Yes [name], MD  amiodarone (PACERONE) 200 MG tablet Take 1 tablet (200  mg total) by mouth 2 (two) times daily for 30 days, THEN 1 tablet (200 mg total) daily. Patient not taking: Reported on 06/03/2022 02/26/22 02/21/23  Marinus Maw, MD  feeding supplement (ENSURE ENLIVE / ENSURE PLUS) LIQD Try to drink 1 can 1-2 times a day. Patient not taking: Reported on 06/03/2022 06/04/20   Tereso Newcomer T, PA-C  Fluticasone-Salmeterol (ADVAIR) 100-50 MCG/DOSE AEPB Inhale 1 puff into the lungs 2 (two) times daily. Patient not taking: Reported on 06/03/2022 01/07/20   Graciella Freer, PA-C  nitroGLYCERIN (NITROSTAT) 0.4 MG SL tablet Place 1 tablet (0.4 mg total) under the tongue every 5 (five) minutes x 3 doses as needed for chest pain. Patient not taking: Reported on 06/03/2022 06/04/20 06/04/21  Tereso Newcomer T, PA-C  pantoprazole (PROTONIX) 40 MG tablet Take 1 tablet (40 mg total) by mouth daily. Patient not taking: Reported on 06/03/2022 10/16/20 10/16/21  Marinda Elk, MD  rivaroxaban (XARELTO) 20 MG TABS tablet Take 1 tablet (20 mg total) by mouth daily with supper. Patient not taking: Reported on 06/03/2022 05/08/22   [name], MD         Darcella Gasman Ardella Chhim, PA-C [facility] See Amion for pager If no response to pager , please call [phone] until 7pm After 7:00 pm call Elink  [phone]

## 2022-06-04 NOTE — Progress Notes (Signed)
PROGRESS NOTE    Benjamin Cook  ZOX:[id number] DOB: 02-14-1945 DOA: 06/02/2022 PCP: [name], MD    Brief Narrative:  This 77 years old Male with PMH significant for ventricular tachycardia s/p VT ablation in September 2022, s/p ICD placement, known adrenal mass, hypertension, COPD, history of atrial fibrillation on anticoagulation, chronic combined heart failure presented in the ED with complaints of chest pain and dizziness for about a week. He denies any discharge of his AICD.  He states he has been losing weight for about 5 pounds in the last several weeks.  He reports he was told that he has a pulmonary nodule in September 2023 and he did not want workup at that time. CT chest showed an increase in the previously known right lung mass. Patient states he did not want this worked up in 2023.  Patient states he would not want to know if he has a cancer or not because this would make him extremely anxious.  Patient states he will talk to his family and discuss and will decide about the workup.  Patient was evaluated by electrophysiologist.  AICD is interrogated.   Pulmonology and oncology consulted,  Patient agreed to have a workup.  Assessment & Plan:   Principal Problem:   Ventricular tachyarrhythmia [facility]) Active Problems:   Mass of right lung   VT (ventricular tachycardia) ([name])   HFrEF (heart failure with reduced ejection fraction) ([name])   ICD (implantable cardioverter-defibrillator) in place   Essential hypertension   Paroxysmal atrial fibrillation ([name])  Mass of right lung: Patient has pulmonary nodule diagnosed in September 2023. He did not want workup for that mass at that time.  It is about doubled in size in the last 7 months.   His PCP has also explained to him about it.  Patient still feels he does not want to know if it is cancer or not.   He states that he would feel very anxious if he knew he had cancer.  He states that he would never want to have chemotherapy  because he knows that it would make him very ill.  He request no further workup of his lung mass.   He states that he will talk to his family and tell the medical team if he changes his mind. Patient finally agreed to have a lung biopsy, states he is not interested in chemotherapy. Pulmonology and oncology is consulted.   VT (ventricular tachycardia) ([name]) Patient was evaluated by cardiology. Continue IV amiodarone.  ICD interrogated. Change Coreg to Toprol 50 mg twice daily and titrate as needed.   Essential hypertension Change Coreg to Toprol 50 mg twice daily.   ICD (implantable cardioverter-defibrillator) in place> Chronic.   HFrEF (heart failure with reduced ejection fraction) ([name]) Continue Entresto. Coreg changed with Toprol-XL   Paroxysmal atrial fibrillation ([name]) Continue Xarelto, and Toprol-XL  DVT prophylaxis:  Xarelto Code Status:Full code Family Communication: Wife at bed side. Disposition Plan:    Status is: Inpatient Remains inpatient appropriate because:   Admitted for ventricular tachycardia, also have increasing size of lung mass.  Patient finally agreed to have a workup.  Pulmonology and oncology is consulted.  Consultants:  Cardiology Pulmonology Oncology  Procedures: None Antimicrobials: None  Subjective: Patient was seen and examined at bedside.  Overnight events noted.   Patient finally agreed to have lung mass worked up.   He wants to have biopsy but does not want chemotherapy and radiation therapy.  Objective: Vitals:   06/04/22  0345 06/04/22 0720 06/04/22 0807 06/04/22 1226  BP: (!) 121/36 (!) 133/42  132/66  Pulse: 75 92 76 65  Resp: (!) 22 19 19 18   Temp: 98.2 F (36.8 C) 98 F (36.7 C)  97.7 F (36.5 C)  TempSrc: Oral Oral  Oral  SpO2: 98% 97% 99% 99%  Weight:      Height:        Intake/Output Summary (Last 24 hours) at 06/04/2022 1255 Last data filed at 06/04/2022 0858 Gross per 24 hour  Intake 811.59 ml  Output 925 ml   Net -113.41 ml   Filed Weights   06/02/22 2200  Weight: 65.8 kg    Examination:  General exam: Appears calm and comfortable , deconditioned, not in any distress. Respiratory system: Clear to auscultation. Respiratory effort normal.  RR 15 Cardiovascular system: S1 & S2 heard, regular rate and rhythm, no murmur. Gastrointestinal system: Abdomen is soft, nontender, nondistended, BS+ Central nervous system: Alert and oriented x 3. No focal neurological deficits. Extremities: No edema, No cyanosis, no clubbing Skin: No rashes, lesions or ulcers Psychiatry: Judgement and insight appear normal. Mood & affect appropriate.     Data Reviewed: I have personally reviewed following labs and imaging studies  CBC: Recent Labs  Lab 06/02/22 2306 06/03/22 0700  WBC 6.8 7.0  NEUTROABS  --  4.8  HGB 9.8* 10.2*  HCT 30.5* 31.2*  MCV 78.0* 76.5*  PLT 297 301   Basic Metabolic Panel: Recent Labs  Lab 06/02/22 2306 06/03/22 0700 06/03/22 1406 06/04/22 0235  NA 137 137  --  136  K 4.0 3.9  --  3.6  CL 105 105  --  107  CO2 23 22  --  23  GLUCOSE 100* 111*  --  99  BUN 7* 6*  --  <5*  CREATININE 0.97 0.93  --  0.88  CALCIUM 8.1* 8.4*  --  8.1*  MG  --   --  1.8 1.8   GFR: Estimated Creatinine Clearance: 64.4 mL/min (by C-G formula based on SCr of 0.88 mg/dL). Liver Function Tests: Recent Labs  Lab 06/03/22 0700  AST 20  ALT 14  ALKPHOS 51  BILITOT 0.5  PROT 6.6  ALBUMIN 2.2*   No results for input(s): "LIPASE", "AMYLASE" in the last 168 hours. No results for input(s): "AMMONIA" in the last 168 hours. Coagulation Profile: No results for input(s): "INR", "PROTIME" in the last 168 hours. Cardiac Enzymes: No results for input(s): "CKTOTAL", "CKMB", "CKMBINDEX", "TROPONINI" in the last 168 hours. BNP (last 3 results) No results for input(s): "PROBNP" in the last 8760 hours. HbA1C: No results for input(s): "HGBA1C" in the last 72 hours. CBG: No results for input(s):  "GLUCAP" in the last 168 hours. Lipid Profile: No results for input(s): "CHOL", "HDL", "LDLCALC", "TRIG", "CHOLHDL", "LDLDIRECT" in the last 72 hours. Thyroid Function Tests: No results for input(s): "TSH", "T4TOTAL", "FREET4", "T3FREE", "THYROIDAB" in the last 72 hours. Anemia Panel: No results for input(s): "VITAMINB12", "FOLATE", "FERRITIN", "TIBC", "IRON", "RETICCTPCT" in the last 72 hours. Sepsis Labs: No results for input(s): "PROCALCITON", "LATICACIDVEN" in the last 168 hours.  No results found for this or any previous visit (from the past 240 hour(s)).   Radiology Studies: CUP PACEART REMOTE DEVICE CHECK  Result Date: 06/03/2022 [employer] remote reviewed. Normal device function.  6 AMS for <1%, <1 min.  Intermittent RA lead noise. 1 RA noise reversion due to RA lead noise 57 SVT classified event, of those listed, longest  1 min 48 sec with V-rates 120's.  Few EGMS. Frequent bigeminy PVC couplets. 32 NSVT clasified events, longest 26 sec, V rates 130s.  One EGMs to view with frequent PVC couplets and triplets. 3 VT-1 events, all on 4/28.  Longset 3 min 2 sec with V-rates 120s.  Total of 28 sequence of ATP delivered.  No EGMs to view.  To triage. Next remote 91 days. [place], [facility].  ECHOCARDIOGRAM COMPLETE  Result Date: 06/03/2022    ECHOCARDIOGRAM REPORT   Patient Name:   OSBORN PULLIN Date of Exam: 06/03/2022 Medical Rec #:  [id number]         Height:       71.0 in Accession #:    [id number]        Weight:       145.0 lb Date of Birth:  November 15, 1945         BSA:          1.839 m Patient Age:    75 years          BP:           125/57 mmHg Patient Gender: M                 HR:           84 bpm. Exam Location:  Inpatient Procedure: 2D Echo, Cardiac Doppler, Color Doppler and Intracardiac            Opacification Agent Indications:    Ventricular Tachycardia I47.2  History:        Patient has prior history of Echocardiogram examinations, most                 recent 10/11/2020.  Cardiomyopathy and CHF, CAD, COPD,                 Arrythmias:Tachycardia and Atrial Fibrillation,                 Signs/Symptoms:Dyspnea and Shortness of Breath; Risk                 Factors:Hypertension, Dyslipidemia and Current Smoker.  Sonographer:    [name] Referring Phys: [id number] MICHAEL ANDREW TILLERY IMPRESSIONS  1. Global hypokinesis with akinesis of the septum and aneurysm formation of apex;overall severe LV dysfunction.  2. Left ventricular ejection fraction, by estimation, is 20 to 25%. The left ventricle has severely decreased function. The left ventricle demonstrates regional wall motion abnormalities (see scoring diagram/findings for description). The left ventricular internal cavity size was mildly dilated. Left ventricular diastolic parameters are consistent with Grade I diastolic dysfunction (impaired relaxation).  3. Right ventricular systolic function is normal. The right ventricular size is normal.  4. The mitral valve is normal in structure. No evidence of mitral valve regurgitation. No evidence of mitral stenosis.  5. The aortic valve is tricuspid. Aortic valve regurgitation is not visualized. Aortic valve sclerosis is present, with no evidence of aortic valve stenosis.  6. The inferior vena cava is normal in size with greater than 50% respiratory variability, suggesting right atrial pressure of 3 mmHg. FINDINGS  Left Ventricle: Left ventricular ejection fraction, by estimation, is 20 to 25%. The left ventricle has severely decreased function. The left ventricle demonstrates regional wall motion abnormalities. Definity contrast agent was given IV to delineate the left ventricular endocardial borders. The left ventricular internal cavity size was mildly dilated. There is no left ventricular hypertrophy. Left ventricular diastolic parameters are consistent with Grade I diastolic dysfunction (impaired  relaxation). Right Ventricle: The right ventricular size is normal. Right ventricular  systolic function is normal. Left Atrium: Left atrial size was normal in size. Right Atrium: Right atrial size was normal in size. Pericardium: Trivial pericardial effusion is present. Mitral Valve: The mitral valve is normal in structure. Mild mitral annular calcification. No evidence of mitral valve regurgitation. No evidence of mitral valve stenosis. Tricuspid Valve: The tricuspid valve is normal in structure. Tricuspid valve regurgitation is trivial. No evidence of tricuspid stenosis. Aortic Valve: The aortic valve is tricuspid. Aortic valve regurgitation is not visualized. Aortic valve sclerosis is present, with no evidence of aortic valve stenosis. Aortic valve peak gradient measures 4.1 mmHg. Pulmonic Valve: The pulmonic valve was normal in structure. Pulmonic valve regurgitation is not visualized. No evidence of pulmonic stenosis. Aorta: The aortic root is normal in size and structure. Venous: The inferior vena cava is normal in size with greater than 50% respiratory variability, suggesting right atrial pressure of 3 mmHg. IAS/Shunts: No atrial level shunt detected by color flow Doppler. Additional Comments: Global hypokinesis with akinesis of the septum and aneurysm formation of apex;overall severe LV dysfunction. A device lead is visualized.  LEFT VENTRICLE PLAX 2D LVIDd:         5.90 cm      Diastology LVIDs:         4.80 cm      LV e' medial:   5.44 cm/s LV PW:         1.00 cm      LV E/e' medial: 8.1 LV IVS:        0.80 cm LVOT diam:     1.80 cm LV SV:         39 LV SV Index:   21 LVOT Area:     2.54 cm  LV Volumes (MOD) LV vol d, MOD A2C: 153.5 ml LV vol d, MOD A4C: 207.0 ml LV vol s, MOD A2C: 109.8 ml LV vol s, MOD A4C: 139.0 ml LV SV MOD A2C:     43.8 ml LV SV MOD A4C:     207.0 ml LV SV MOD BP:      53.6 ml RIGHT VENTRICLE             IVC RV Basal diam:  3.10 cm     IVC diam: 0.90 cm RV S prime:     11.20 cm/s TAPSE (M-mode): 1.3 cm LEFT ATRIUM             Index        RIGHT ATRIUM           Index  LA diam:        3.75 cm 2.04 cm/m   RA Area:     14.20 cm LA Vol (A2C):   34.7 ml 18.87 ml/m  RA Volume:   38.00 ml  20.66 ml/m LA Vol (A4C):   29.4 ml 15.99 ml/m LA Biplane Vol: 34.2 ml 18.59 ml/m  AORTIC VALVE AV Area (Vmax): 2.08 cm AV Vmax:        101.00 cm/s AV Peak Grad:   4.1 mmHg LVOT Vmax:      82.43 cm/s LVOT Vmean:     50.800 cm/s LVOT VTI:       0.153 m  AORTA Ao Root diam: 2.90 cm Ao Asc diam:  3.00 cm MITRAL VALVE               TRICUSPID VALVE MV Area (PHT): 4.39 cm  TR Peak grad:   14.9 mmHg MV Decel Time: 173 msec    TR Vmax:        193.00 cm/s MV E velocity: 44.10 cm/s MV A velocity: 86.30 cm/s  SHUNTS MV E/A ratio:  0.51        Systemic VTI:  0.15 m                            Systemic Diam: 1.80 cm Olga Millers MD Electronically signed by Olga Millers MD Signature Date/Time: 06/03/2022/2:58:23 PM    Final    CT Chest W Contrast  Result Date: 06/03/2022 CLINICAL DATA:  Pleural effusion, malignancy suspected. EXAM: CT CHEST WITH CONTRAST TECHNIQUE: Multidetector CT imaging of the chest was performed during intravenous contrast administration. RADIATION DOSE REDUCTION: This exam was performed according to the departmental dose-optimization program which includes automated exposure control, adjustment of the mA and/or kV according to patient size and/or use of iterative reconstruction technique. CONTRAST:  50mL OMNIPAQUE IOHEXOL 350 MG/ML SOLN COMPARISON:  10/13/2021. FINDINGS: Cardiovascular: The heart is enlarged and there is a small pericardial effusion. Pacemaker leads are noted in the heart. Three-vessel coronary artery calcifications are noted. There is atherosclerotic calcification of the aorta without evidence of aneurysm. The pulmonary trunk is normal in caliber. Mediastinum/Nodes: Prominent lymph node is present in the subcarinal space measuring 1 cm. No hilar or axillary lymphadenopathy is seen. The thyroid gland, trachea, and esophagus are within normal limits. There is a  small hiatal hernia. Lungs/Pleura: Paraseptal and centrilobular emphysematous changes are present in the lungs. Debris is present in the left mainstem bronchus. There is bronchial wall thickening bilaterally with multifocal mucous plugging in the bilateral lower lobes, greater on the left than on the right. Atelectasis is noted at the lung bases. There are trace bilateral pleural effusions. Trace bilateral pleural effusions are noted. No pneumothorax. There is redemonstration of a mass in the suprahilar region on the right measuring 4.1 x 4.1 cm, increased in size from the prior exam. Pleural and parenchymal scarring is noted bilaterally. Upper Abdomen: There is a 4.1 cm mass in the right adrenal gland containing calcifications, unchanged from 2009. Cysts are present in the right kidney. Subcentimeter hypodensities are present in the left kidney which are too small to further characterize. An aortic endograft is noted in the upper abdomen. No acute abnormality. Musculoskeletal: A pacemaker device is present in the anterior chest wall on the left. Degenerative changes are present in the thoracic spine. No acute or suspicious osseous abnormality. IMPRESSION: 1. Interval enlargement of suprahilar mass on the right now measuring 4.1 x 4.1 cm, suspicious for neoplasm. PET-CT or biopsy is recommended for further evaluation. 2. Bilateral bronchial wall thickening with multifocal mucous plugging in the lower lobes bilaterally, greater on the left than on the right. 3. Emphysema. 4. Aortic atherosclerosis and coronary artery calcifications. Electronically Signed   By: Thornell Sartorius M.D.   On: 06/03/2022 01:28   DG Chest 2 View  Result Date: 06/02/2022 CLINICAL DATA:  Chest pain EXAM: CHEST - 2 VIEW COMPARISON:  01/22/2022 FINDINGS: Cardiac shadow is within normal limits. Defibrillator is again noted. Aortic calcifications are seen. Mild scarring is noted in the right apex stable from the prior exam. Fullness in the right  hilar region is noted which corresponds to a soft tissue density seen on prior CT examination from 10/13/2021. This may represent enlarging right hilar mass. No bony abnormality is noted. No other  focal abnormality is seen. IMPRESSION: Prominent right hilum with focal mass lesion on the lateral projection similar to that seen on prior CT examination. Again this is suspicious for neoplasm. Follow-up CT is recommended for further evaluation. Electronically Signed   By: Alcide Clever M.D.   On: 06/02/2022 23:32     Scheduled Meds:  atorvastatin  80 mg Oral q1800   isosorbide mononitrate  15 mg Oral Daily   metoprolol succinate  50 mg Oral BID   mometasone-formoterol  2 puff Inhalation BID   sacubitril-valsartan  1 tablet Oral BID   Continuous Infusions:  amiodarone 30 mg/hr (06/04/22 0539)   magnesium sulfate bolus IVPB 4 g (06/04/22 1138)     LOS: 1 day    Time spent: 50 mins    Willeen Niece, MD [facility]   If 7PM-7AM, please contact night-coverage

## 2022-06-04 NOTE — H&P (View-Only) (Signed)
 NAME:  Benjamin Cook, MRN:  [id number], DOB:  10/15/1945, LOS: 1 ADMISSION DATE:  06/02/2022, CONSULTATION DATE:  4/30 REFERRING MD:  Dr. Khatri, CHIEF COMPLAINT:  Lung mass   History of Present Illness:  Patient is a 76 yo M w/ pertinent PMH VT s/p ablation 2022 s/p icd placement, HTN, Afib on AC, HFrEF, adrenal mass, COPD, tobacco abuse presents to [facility] w/ chest pain.  Patient was recently told about a 2.1 cm lung nodule on September 2023 but deferred further work up. Patient states he has been having chest pain w/ dizziness over the past week. Also having presyncope. Denies any fevers/chills. States he has lost 5 pounds in the last few weeks as he hasn't had an appetite. Denies any night sweats but had an isolated episode of hemoptysis several months ago.   On 4/28 patient came to [facility] ED. Cards consulted. Defibrillator interrogated showing 3 runs of vtach and was admitted for further work-up.   Pt reports about a 60 pack yr history of smoking, but quit about three years ago  He takes Xarelto which was held on admission   Pertinent  Medical History   Past Medical History:  Diagnosis Date   Adrenal mass ([name])    per pt this is remote (10 years) and benign by biopsy   AICD (automatic cardioverter/defibrillator) present    Anxiety    Arthritis    CAD (coronary artery disease)    a. s/p PCI to LAD 2001 b. myoview 2014 high risk with scar LAD/RCA territory but no ischemia   Cardiomyopathy, ischemic    CHF (congestive heart failure) ([name])    class II/III   COPD (chronic obstructive pulmonary disease) ([name])    smokes cigars but has quit cigarettes   Defibrillator discharge 04/21/2019   Diverticulitis    Dyspnea    Flu 03/2015   HTN (hypertension)    Hyperlipidemia    NSVT (nonsustained ventricular tachycardia) ([name]) 03/05/2017   Paroxysmal atrial fibrillation ([name]) 03/2015   chads2vasc score is 4   PSA (psoriatic arthritis) ([name])    increased   PVD (peripheral vascular disease)  ([name])      Significant Hospital Events: Including procedures, antibiotic start and stop dates in addition to other pertinent events   4/30 pccm consulted for lung mass work up  Interim History / Subjective:  No further chest pain   Objective   Blood pressure (!) 133/42, pulse 76, temperature 98 F (36.7 C), temperature source Oral, resp. rate 19, height 5' 11" (1.803 m), weight 65.8 kg, SpO2 99 %.        Intake/Output Summary (Last 24 hours) at 06/04/2022 1051 Last data filed at 06/04/2022 0858 Gross per 24 hour  Intake 811.59 ml  Output 925 ml  Net -113.41 ml   Filed Weights   06/02/22 2200  Weight: 65.8 kg    General:  well-nourished elderly M, resting in bed in no acute distress HEENT: MM pink/moist, sclera anicteric  Neuro: awake, alert and oriented  CV: s1s2 rrr, no m/r/g PULM:  clear bilaterally  GI: soft, bsx4 active  Extremities: warm/dry, No edema  Skin: no rashes or lesions   Resolved Hospital Problem list     Assessment & Plan:    RUL Lung Mass History of Tobacco abuse Ct chest 4.1 x 4.1 cm suprahilar mass -Pulm nodule diagnosed September 2023 but deferred further workup; now increased in size  -Pt was initially hesitant to pursue further work-up, he is still not sure whether   he would want chemo or radiation but would like to proceed with EBUS to understand what his treatment options are and would like to have this done as soon as possible pending clearance from cardiology -continue to hold Xarelto   COPD Multifocal mucous plugging L>R -pulm toiletry: IS/Flutter/pt/ot -on home dulera -prn duoneb for wheezing  Presyncope  Recurrent VT VT s/p ablation 2022 HTN CAD ICD in place HFrEF from ICM: EF 35% Paroxysmal atrial fibrillation on xarelto P: -per cards and EP  Best Practice (right click and "Reselect all SmartList Selections" daily)   Per primary  Labs   CBC: Recent Labs  Lab 06/02/22 2306 06/03/22 0700  WBC 6.8 7.0  NEUTROABS   --  4.8  HGB 9.8* 10.2*  HCT 30.5* 31.2*  MCV 78.0* 76.5*  PLT 297 301    Basic Metabolic Panel: Recent Labs  Lab 06/02/22 2306 06/03/22 0700 06/03/22 1406 06/04/22 0235  NA 137 137  --  136  K 4.0 3.9  --  3.6  CL 105 105  --  107  CO2 23 22  --  23  GLUCOSE 100* 111*  --  99  BUN 7* 6*  --  <5*  CREATININE 0.97 0.93  --  0.88  CALCIUM 8.1* 8.4*  --  8.1*  MG  --   --  1.8 1.8   GFR: Estimated Creatinine Clearance: 64.4 mL/min (by C-G formula based on SCr of 0.88 mg/dL). Recent Labs  Lab 06/02/22 2306 06/03/22 0700  WBC 6.8 7.0    Liver Function Tests: Recent Labs  Lab 06/03/22 0700  AST 20  ALT 14  ALKPHOS 51  BILITOT 0.5  PROT 6.6  ALBUMIN 2.2*   No results for input(s): "LIPASE", "AMYLASE" in the last 168 hours. No results for input(s): "AMMONIA" in the last 168 hours.  ABG    Component Value Date/Time   PHART 7.432 03/28/2015 1702   PCO2ART 36.8 03/28/2015 1702   PO2ART 48.0 (L) 03/28/2015 1702   HCO3 23.6 01/22/2022 1040   TCO2 25 01/22/2022 1040   O2SAT 96 01/22/2022 1040     Coagulation Profile: No results for input(s): "INR", "PROTIME" in the last 168 hours.  Cardiac Enzymes: No results for input(s): "CKTOTAL", "CKMB", "CKMBINDEX", "TROPONINI" in the last 168 hours.  HbA1C: Hgb A1c MFr Bld  Date/Time Value Ref Range Status  08/08/2017 09:04 AM 6.0 4.6 - 6.5 % Final    Comment:    Glycemic Control Guidelines for People with Diabetes:Non Diabetic:  <6%Goal of Therapy: <7%Additional Action Suggested:  >8%   12/09/2016 11:00 AM 6.4 4.6 - 6.5 % Final    Comment:    Glycemic Control Guidelines for People with Diabetes:Non Diabetic:  <6%Goal of Therapy: <7%Additional Action Suggested:  >8%     CBG: No results for input(s): "GLUCAP" in the last 168 hours.  Review of Systems:   Please see the history of present illness. All other systems reviewed and are negative    Past Medical History:  He,  has a past medical history of Adrenal  mass ([name]), AICD (automatic cardioverter/defibrillator) present, Anxiety, Arthritis, CAD (coronary artery disease), Cardiomyopathy, ischemic, CHF (congestive heart failure) ([name]), COPD (chronic obstructive pulmonary disease) ([name]), Defibrillator discharge (04/21/2019), Diverticulitis, Dyspnea, Flu (03/2015), HTN (hypertension), Hyperlipidemia, NSVT (nonsustained ventricular tachycardia) ([name]) (03/05/2017), Paroxysmal atrial fibrillation ([name]) (03/2015), PSA (psoriatic arthritis) ([name]), and PVD (peripheral vascular disease) ([name]).   Surgical History:   Past Surgical History:  Procedure Laterality Date   ABDOMINAL AORTIC ENDOVASCULAR STENT   GRAFT N/A 03/03/2017   Procedure: ABDOMINAL AORTIC ENDOVASCULAR STENT GRAFT;  Surgeon: Cain, Brandon Christopher, MD;  Location: [facility] OR;  Service: Vascular;  Laterality: N/A;   cataract surgery     CORONARY ANGIOGRAPHY N/A 10/12/2020   Procedure: CORONARY ANGIOGRAPHY;  Surgeon: Berry, Jonathan J, MD;  Location: [facility] INVASIVE CV LAB;  Service: Cardiovascular;  Laterality: N/A;   CORONARY ANGIOPLASTY WITH STENT PLACEMENT  2001   a. PCI to LAD   IMPLANTABLE CARDIOVERTER DEFIBRILLATOR (ICD) GENERATOR CHANGE N/A 03/18/2014   a. SJM Fortify ST DR ICD implanted by JA for primary prevention b. gen change 03/2014    LEAD REVISION/REPAIR N/A 07/17/2016   Procedure: Lead Revision/Repair;  Surgeon: Taylor, Gregg W, MD;  Location: [facility] INVASIVE CV LAB;  Service: Cardiovascular;  Laterality: N/A;   LEFT HEART CATHETERIZATION WITH CORONARY ANGIOGRAM N/A 05/27/2014   Procedure: LEFT HEART CATHETERIZATION WITH CORONARY ANGIOGRAM;  Surgeon: Michael Cooper, MD;  Location: [facility] CATH LAB;  Service: Cardiovascular;  Laterality: N/A;   REPAIR KNEE LIGAMENT     V TACH ABLATION N/A 10/26/2020   Procedure: V TACH ABLATION;  Surgeon: Taylor, Gregg W, MD;  Location: [facility]AS[facility] CV [facility];  Service: Cardiovascular;  Laterality: N/A;     Social History:   reports that he has been smoking cigars. He has never  used smokeless tobacco. He reports that he does not drink alcohol and does not use drugs.   Family History:  His family history includes Cirrhosis in his mother. There is no history of Cancer.   Allergies No Known Allergies   Home Medications  Prior to Admission medications   Medication Sig Start Date End Date Taking? Authorizing Provider  acetaminophen (TYLENOL) 500 MG tablet Take 500-1,000 mg by mouth every 8 (eight) hours as needed (for pain).    Yes [provider]  atorvastatin (LIPITOR) 80 MG tablet TAKE 1 TABLET BY MOUTH DAILY AT 6 PM Patient taking differently: Take 80 mg by mouth daily. TAKE 1 TABLET BY MOUTH  DAILY AT 6 PM 11/23/21  Yes Crenshaw, Brian S, MD  carvedilol (COREG) 6.25 MG tablet TAKE 1 TABLET BY MOUTH TWICE A DAY WITH MEALS Patient taking differently: Take 6.25 mg by mouth 2 (two) times daily with a meal. 12/31/21  Yes Taylor, Gregg W, MD  isosorbide mononitrate (IMDUR) 30 MG 24 hr tablet TAKE 1/2 OF A TABLET (15 MG TOTAL) BY MOUTH DAILY Patient taking differently: Take 30 mg by mouth daily. 12/18/21  Yes Crenshaw, Brian S, MD  melatonin 5 MG TABS Take 1 tablet (5 mg total) by mouth at bedtime. 10/16/20  Yes Feliz Ortiz, Abraham, MD  mometasone-formoterol (DULERA) 100-5 MCG/ACT AERO Inhale 2 puffs into the lungs in the morning and at bedtime. 01/31/22  Yes Allred, James, MD  Multiple Vitamin (MULTIVITAMIN) capsule Take 1 capsule by mouth daily.   Yes [provider]  potassium chloride SA (KLOR-CON M20) 20 MEQ tablet TAKE 1 TABLET (20 MEQ TOTAL) BY MOUTH DAILY. PLEASE SCHEDULE APPOINTMENT FOR ADDITIONAL REFILLS. Patient taking differently: Take 20 mEq by mouth daily. 05/20/22  Yes Taylor, Gregg W, MD  rivaroxaban (XARELTO) 20 MG TABS tablet Take 1 tablet (20 mg total) by mouth daily with supper. 05/08/22  Yes Crenshaw, Brian S, MD  sacubitril-valsartan (ENTRESTO) 97-103 MG TAKE 1 TABLET BY MOUTH TWICE A DAY Patient taking differently: Take 1 tablet by  mouth 2 (two) times daily. 05/21/22  Yes Crenshaw, Brian S, MD  amiodarone (PACERONE) 200 MG tablet Take 1 tablet (200   mg total) by mouth 2 (two) times daily for 30 days, THEN 1 tablet (200 mg total) daily. Patient not taking: Reported on 06/03/2022 02/26/22 02/21/23  Taylor, Gregg W, MD  feeding supplement (ENSURE ENLIVE / ENSURE PLUS) LIQD Try to drink 1 can 1-2 times a day. Patient not taking: Reported on 06/03/2022 06/04/20   Weaver, Scott T, PA-C  Fluticasone-Salmeterol (ADVAIR) 100-50 MCG/DOSE AEPB Inhale 1 puff into the lungs 2 (two) times daily. Patient not taking: Reported on 06/03/2022 01/07/20   Tillery, Michael Andrew, PA-C  nitroGLYCERIN (NITROSTAT) 0.4 MG SL tablet Place 1 tablet (0.4 mg total) under the tongue every 5 (five) minutes x 3 doses as needed for chest pain. Patient not taking: Reported on 06/03/2022 06/04/20 06/04/21  Weaver, Scott T, PA-C  pantoprazole (PROTONIX) 40 MG tablet Take 1 tablet (40 mg total) by mouth daily. Patient not taking: Reported on 06/03/2022 10/16/20 10/16/21  Feliz Ortiz, Abraham, MD  rivaroxaban (XARELTO) 20 MG TABS tablet Take 1 tablet (20 mg total) by mouth daily with supper. Patient not taking: Reported on 06/03/2022 05/08/22   Crenshaw, Brian S, MD         Franck Vinal R Sirena Riddle, PA-C [facility] See Amion for pager If no response to pager , please call [phone] until 7pm After 7:00 pm call Elink  [phone]     

## 2022-06-04 NOTE — Plan of Care (Signed)

## 2022-06-04 NOTE — Progress Notes (Signed)
Brief PCCM Progress Note  # R hilar mass - if remains inpatient then will plan EBUS at 12pm on 5/3 - needs to be off of DOAC for 2 days before procedure, heparin gtt for 6 hours, lovenox hold evening dose night before procedure and on AM of procedure - NPO after midnight the night before procedure - if discharges before that then can set up for 5/16 as outpatient  [facility]

## 2022-06-04 NOTE — Progress Notes (Signed)
  Patient Name: Benjamin Cook Date of Encounter: 06/04/2022  Primary Cardiologist: Olga Millers, MD Electrophysiologist: Dr. Ladona Ridgel  Interval Summary   Feeling OK this am.   No further sustained VT but continues to have frequent couplets/triplets as often as bigeminal.   Inpatient Medications    Scheduled Meds:  atorvastatin  80 mg Oral q1800   isosorbide mononitrate  15 mg Oral Daily   metoprolol succinate  50 mg Oral BID   mometasone-formoterol  2 puff Inhalation BID   sacubitril-valsartan  1 tablet Oral BID   Continuous Infusions:  amiodarone 30 mg/hr (06/04/22 0539)   PRN Meds: acetaminophen, nitroGLYCERIN, ondansetron (ZOFRAN) IV   Vital Signs    Vitals:   06/03/22 2307 06/04/22 0345 06/04/22 0720 06/04/22 0807  BP: 134/60 (!) 121/36 (!) 133/42   Pulse: 64 75 92 76  Resp: (!) 22 (!) 22 19 19   Temp: 98.6 F (37 C) 98.2 F (36.8 C) 98 F (36.7 C)   TempSrc: Oral Oral Oral   SpO2: 98% 98% 97% 99%  Weight:      Height:        Intake/Output Summary (Last 24 hours) at 06/04/2022 0902 Last data filed at 06/04/2022 0858 Gross per 24 hour  Intake 811.59 ml  Output 925 ml  Net -113.41 ml   Filed Weights   06/02/22 2200  Weight: 65.8 kg    Physical Exam    GEN- The patient is well appearing, alert and oriented x 3 today.   Lungs- Clear to ausculation bilaterally, normal work of breathing Cardiac-  Regularly irregular  rate and rhythm, no murmurs, rubs or gallops GI- soft, NT, ND, + BS Extremities- no clubbing or cyanosis. No edema  Telemetry    NSR 60-90s with very frequent PVCs/couplets/triplets (personally reviewed)  Hospital Course    Benjamin Cook is a 77 y.o. male with a history of PAF, VT s/p ICD placement, VT ablation 10/2020, Chronic systolic CHF, Anemia, lung nodule who is being seen today for the evaluation of VT at the request of Dr. Mayford Knife.   Assessment & Plan    Recurrent VT S/p ablation 10/2020 Had previously been quiescent s/p  ablation apart from episode 01/2022  Amiodarone stopped 04/2022 for ESR+. No clear symptoms were documented.  No further sustained but continues to have frequent NSVT/PVCs.  Continue IV amiodarone for now Change coreg to toprol 50 mg BID. Titrate as needed.  Potassium3.6 (04/30 0235) - supp ordered Magnesium  1.8 (04/30 0235) - supp ordered Creatinine, ser  0.88 (04/30 0235) Keep K > 4.0 and Mg > 2.0    Chronic systolic CHF Echo 4/29 LVEF 20-25%, global HK with akinesis of the septum and aneurysm formation of apex, normal RV, no significant valvular disease.  Denies s/s ischemia, HS trop flat and near baseline Continue statin Continue GDMT   CAD h/o LAD stent HS trop negative as above.  Mod to severe non-obstructive disease 10/2020   HTN Follow with HF med adjustment.    Afib Burden low chronically Continue xarelto for chads2vasc score of 5.    Lung mass Highly concerning for neoplasm given size change from 10/2021 Per medicine team If pt declines work up will need palliative care. He is discussing with his family.   For questions or updates, please contact [facility] Please consult [email or web address] for contact info under Cardiology/STEMI.  Signed, Graciella Freer, PA-C  06/04/2022, 9:02 AM

## 2022-06-04 NOTE — Care Management (Signed)
  Transition of Care [facility]) Screening Note   Patient Details  Name: Benjamin Cook Date of Birth: Jan 30, 1946   Transition of Care [facility]) CM/SW Contact:    Gala Lewandowsky, RN Phone Number: 06/04/2022, 11:13 AM    Transition of Care Department [facility]) has reviewed the patient and no TOC needs have been identified at this time. Patient presented for lightheadedness and presyncope. Patient has insurance and PCP. Patient initiated on IV Amio. We will continue to monitor patient advancement through interdisciplinary progression rounds. If new patient transition needs arise, please place a TOC consult.

## 2022-06-04 NOTE — Consult Note (Signed)
[facility] CONSULT NOTE  Patient Care Team: [name], [name], [name], [name]  ASSESSMENT & PLAN:  Left lung mass The size of the lung mass has doubled since last year This is malignant until proven otherwise Appreciate pulmonary consult He could benefit from imaging study of the head; due to presence of defibrillator, I will order CT imaging of the head with contrast He would benefit from outpatient PET/CT imaging after discharge I will go ahead and set up outpatient appointment next week to follow-up on results of lung biopsy  Right adrenal mass This has been present for a very long time Unlikely to be malignant  Recent weight loss, protein calorie malnutrition Could be due to untreated cancer Recommend frequent small meals  Microcytic anemia Could be due to iron deficiency Will order additional test tomorrow  Coronary artery disease status ICD implantation, paroxysmal atrial fibrillation, VT Will continue medical management as directed Per request by pulmonologist, his anticoagulation will be placed on hold until after biopsy, plan for Friday  Discharge planning I am hopeful he can be discharged and of the week I will set up outpatient follow-up on May 10 Please call if questions arise  The total time spent in the appointment was 80 minutes encounter with patients including review of chart and various tests results, discussions about plan of care and coordination of care plan   All questions were answered. The patient knows to call the clinic with any problems, questions or concerns. No barriers to learning was detected.  Artis Delay, MD 4/30/20244:43 PM  CHIEF COMPLAINTS/PURPOSE OF CONSULTATION:  Left lung mass, possible malignancy, for further evaluation  HISTORY OF PRESENTING ILLNESS:  Benjamin Cook 77 y.o.  male is seen at the request by primary service The patient have a significant smoking history, quit 3 years ago Last year, he was noted to have abnormal CT imaging of the chest He was recommended further evaluation and follow-up but declined workup Yesterday, the patient complained of lightheadedness with symptoms of presyncopal episode.  At baseline, he has significant shortness of breath on minimal exertion He was subsequently admitted to the hospital for further evaluation Imaging study of the chest was performed which show abnormalities leading to follow-up CT imaging He is noted to have a left lung mass that is now almost doubled the size in comparison from prior imaging study from last year The patient is known to have adrenal mass for a very long time At baseline, he is somewhat functional at home with the help from his wife He has noticed 4 episodes of hemoptysis in the last 2 months; at baseline, due to COPD, he has mild productive cough with clear phlegm He has loss of appetite and has lost an unknown amount of weight He denies nausea or changes in bowel habits Denies abdominal pain or bone pain Apart from weakness and dizziness, he denies recent headache or neurological deficits  MEDICAL HISTORY:  Past Medical History:  Diagnosis Date   Adrenal mass ([name])    per pt this is remote (10 years) and benign by biopsy   AICD (automatic cardioverter/defibrillator) present    Anxiety    Arthritis    CAD (coronary artery disease)    a. s/p PCI to LAD 2001 b. myoview 2014 high risk with scar LAD/RCA territory but no ischemia   Cardiomyopathy, ischemic  CHF (congestive heart failure) ([name])    class II/III   COPD (chronic obstructive pulmonary disease) ([name])    smokes cigars but has quit cigarettes   Defibrillator discharge 04/21/2019   Diverticulitis    Dyspnea    Flu 03/2015   HTN (hypertension)    Hyperlipidemia    NSVT (nonsustained ventricular tachycardia) ([name]) 03/05/2017    Paroxysmal atrial fibrillation ([name]) 03/2015   chads2vasc score is 4   PSA (psoriatic arthritis) ([name])    increased   PVD (peripheral vascular disease) ([name])     SURGICAL HISTORY: Past Surgical History:  Procedure Laterality Date   ABDOMINAL AORTIC ENDOVASCULAR STENT GRAFT N/A 03/03/2017   Procedure: ABDOMINAL AORTIC ENDOVASCULAR STENT GRAFT;  Surgeon: Maeola Harman, MD;  Location: [facility] OR;  Service: Vascular;  Laterality: N/A;   cataract surgery     CORONARY ANGIOGRAPHY N/A 10/12/2020   Procedure: CORONARY ANGIOGRAPHY;  Surgeon: Runell Gess, MD;  Location: [facility] INVASIVE CV LAB;  Service: Cardiovascular;  Laterality: N/A;   CORONARY ANGIOPLASTY WITH STENT PLACEMENT  2001   a. PCI to LAD   IMPLANTABLE CARDIOVERTER DEFIBRILLATOR (ICD) GENERATOR CHANGE N/A 03/18/2014   a. SJM Fortify ST DR ICD implanted by [facility] for primary prevention b. gen change 03/2014    LEAD REVISION/REPAIR N/A 07/17/2016   Procedure: Lead Revision/Repair;  Surgeon: Marinus Maw, MD;  Location: [facility] INVASIVE CV LAB;  Service: Cardiovascular;  Laterality: N/A;   LEFT HEART CATHETERIZATION WITH CORONARY ANGIOGRAM N/A 05/27/2014   Procedure: LEFT HEART CATHETERIZATION WITH CORONARY ANGIOGRAM;  Surgeon: Tonny Bollman, MD;  Location: [facility] CATH LAB;  Service: Cardiovascular;  Laterality: N/A;   REPAIR KNEE LIGAMENT     V TACH ABLATION N/A 10/26/2020   Procedure: V TACH ABLATION;  Surgeon: Marinus Maw, MD;  Location: [facility] INVASIVE CV LAB;  Service: Cardiovascular;  Laterality: N/A;    SOCIAL HISTORY: Social History   Socioeconomic History   Marital status: Married    Spouse name: Not on file   Number of children: Not on file   Years of education: Not on file   Highest education level: Not on file  Occupational History   Occupation: Lawn care  Tobacco Use   Smoking status: Every Day    Types: Cigars   Smokeless tobacco: Never   Tobacco comments:    smokes cigars now, no longer smokes cigarettes but  previously smoked 2ppd  Vaping Use   Vaping Use: Never used  Substance and Sexual Activity   Alcohol use: No   Drug use: No   Sexual activity: Not Currently    Partners: Female    Birth control/protection: None  Other Topics Concern   Not on file  Social History Narrative   Not on file   Social Determinants of Health   Financial Resource Strain: Not on file  Food Insecurity: No Food Insecurity (06/03/2022)   Hunger Vital Sign    Worried About Running Out of Food in the Last Year: Never true    Ran Out of Food in the Last Year: Never true  Transportation Needs: No Transportation Needs (06/03/2022)   PRAPARE - Administrator, Civil Service (Medical): No    Lack of Transportation (Non-Medical): No  Physical Activity: Not on file  Stress: Not on file  Social Connections: Not on file  Intimate Partner Violence: Not At Risk (06/03/2022)   Humiliation, Afraid, Rape, and Kick questionnaire    Fear of Current or Ex-Partner: No    Emotionally  Abused: No    Physically Abused: No    Sexually Abused: No    FAMILY HISTORY: Family History  Problem Relation Age of Onset   Cirrhosis Mother        due to ETOH   Cancer Neg Hx     ALLERGIES:  has No Known Allergies.  MEDICATIONS:  Current Facility-Administered Medications  Medication Dose Route Frequency Provider Last Rate Last Admin   acetaminophen (TYLENOL) tablet 650 mg  650 mg Oral Q4H PRN Regan Rakers, MD       amiodarone (NEXTERONE PREMIX) 360-4.14 MG/200ML-% (1.8 mg/mL) IV infusion  30 mg/hr Intravenous Continuous Regan Rakers, MD 16.67 mL/hr at 06/04/22 1524 30 mg/hr at 06/04/22 1524   atorvastatin (LIPITOR) tablet 80 mg  80 mg Oral q1800 Carollee Herter, DO   80 mg at 06/03/22 1721   ipratropium-albuterol (DUONEB) 0.5-2.5 (3) MG/3ML nebulizer solution 3 mL  3 mL Nebulization Q4H PRN Lidia Collum, PA-C       isosorbide mononitrate (IMDUR) 24 hr tablet 15 mg  15 mg Oral Daily Carollee Herter, DO   15  mg at 06/04/22 9604   metoprolol succinate (TOPROL-XL) 24 hr tablet 50 mg  50 mg Oral BID Graciella Freer, PA-C   50 mg at 06/04/22 5409   mometasone-formoterol (DULERA) 100-5 MCG/ACT inhaler 2 puff  2 puff Inhalation BID Carollee Herter, DO   2 puff at 06/04/22 0807   nitroGLYCERIN (NITROSTAT) SL tablet 0.4 mg  0.4 mg Sublingual Q5 Min x 3 PRN Regan Rakers, MD       ondansetron [facility] injection 4 mg  4 mg Intravenous Q6H PRN Regan Rakers, MD       sacubitril-valsartan [facility]) 97-103 mg per tablet  1 tablet Oral BID Regan Rakers, MD   1 tablet at 06/04/22 [phone]    REVIEW OF SYSTEMS:   Constitutional: Denies fevers, chills or abnormal night sweats Eyes: Denies blurriness of vision, double vision or watery eyes Ears, nose, mouth, throat, and face: Denies mucositis or sore throat Cardiovascular: Denies palpitation, chest discomfort or lower extremity swelling Gastrointestinal:  Denies nausea, heartburn or change in bowel habits Skin: Denies abnormal skin rashes Lymphatics: Denies new lymphadenopathy or easy bruising Behavioral/Psych: Mood is stable, no new changes  All other systems were reviewed with the patient and are negative.  PHYSICAL EXAMINATION: ECOG PERFORMANCE STATUS: 2 - Symptomatic, <50% confined to bed  Vitals:   06/04/22 0807 06/04/22 1226  BP:  132/66  Pulse: 76 65  Resp: 19 18  Temp:  97.7 F (36.5 C)  SpO2: 99% 99%   Filed Weights   06/02/22 2200  Weight: 145 lb (65.8 kg)    GENERAL:alert, no distress and comfortable.  He looks a little thin SKIN: skin color, texture, turgor are normal, no rashes or significant lesions EYES: normal, conjunctiva are pink and non-injected, sclera clear OROPHARYNX:no exudate, no erythema and lips, buccal mucosa, and tongue normal  NECK: supple, thyroid normal size, non-tender, without nodularity LYMPH:  no palpable lymphadenopathy in the cervical, axillary or inguinal LUNGS: clear  to auscultation and percussion with normal breathing effort HEART: regular rate & rhythm and no murmurs and no lower extremity edema ABDOMEN:abdomen soft, non-tender and normal bowel sounds Musculoskeletal:no cyanosis of digits and no clubbing  PSYCH: alert & oriented x 3 with fluent speech NEURO: no focal motor/sensory deficits  LABORATORY DATA:  I have reviewed the data as listed Lab Results  Component  Value Date   WBC 7.0 06/03/2022   HGB 10.2 (L) 06/03/2022   HCT 31.2 (L) 06/03/2022   MCV 76.5 (L) 06/03/2022   PLT 301 06/03/2022   Recent Labs    10/14/21 0550 10/14/21 0611 01/22/22 0829 01/22/22 1040 06/02/22 2306 06/03/22 0700 06/04/22 0235  NA 137   < > 136   < > 137 137 136  K 3.7   < > 4.0   < > 4.0 3.9 3.6  CL 102  --  103   < > 105 105 107  CO2 23  --  25   < > 23 22 23   GLUCOSE 218*  --  113*   < > 100* 111* 99  BUN 9  --  7*   < > 7* 6* <5*  CREATININE 1.29*  --  0.98   < > 0.97 0.93 0.88  CALCIUM 8.5*  --  9.0   < > 8.1* 8.4* 8.1*  GFRNONAA 57*  --  >60  --  >60 >60 >60  PROT 7.0  --  7.7  --   --  6.6  --   ALBUMIN 3.0*  --  2.9*  --   --  2.2*  --   AST 62*  --  47*  --   --  20  --   ALT 61*  --  49*  --   --  14  --   ALKPHOS 59  --  63  --   --  51  --   BILITOT 0.3  --  0.7  --   --  0.5  --    < > = values in this interval not displayed.    RADIOGRAPHIC STUDIES: I have personally reviewed the radiological images as listed and agreed with the findings in the report. CUP PACEART REMOTE DEVICE CHECK  Result Date: 06/03/2022 Bill[employer] remote reviewed. Normal device function.  6 AMS for <1%, <1 min.  Intermittent RA lead noise. 1 RA noise reversion due to RA lead noise 57 SVT classified event, of those listed, longest 1 min 48 sec with V-rates 120's.  Few EGMS. Frequent bigeminy PVC couplets. 32 NSVT clasified events, longest 26 sec, V rates 130s.  One EGMs to view with frequent PVC couplets and triplets. 3 VT-1 events, all on 4/28.   Longset 3 min 2 sec with V-rates 120s.  Total of 28 sequence of ATP delivered.  No EGMs to view.  To triage. Next remote 91 days. [place], CV[facility].  ECHOCARDIOGRAM COMPLETE  Result Date: 06/03/2022    ECHOCARDIOGRAM REPORT   Patient Name:   Benjamin Cook Date of Exam: 06/03/2022 Medical Rec #:  [id number]         Height:       71.0 in Accession #:    [id number]        Weight:       145.0 lb Date of Birth:  [birth year]         BSA:          1.839 m Patient Age:    77 years          BP:           125/57 mmHg Patient Gender: M                 HR:           84 bpm. Exam Location:  Inpatient Procedure: 2D Echo, Cardiac Doppler, Color Doppler and  Intracardiac            Opacification Agent Indications:    Ventricular Tachycardia I47.2  History:        Patient has prior history of Echocardiogram examinations, most                 recent 10/11/2020. Cardiomyopathy and CHF, CAD, COPD,                 Arrythmias:Tachycardia and Atrial Fibrillation,                 Signs/Symptoms:Dyspnea and Shortness of Breath; Risk                 Factors:Hypertension, Dyslipidemia and Current Smoker.  Sonographer:    [name] Referring Phys: [id number] MICHAEL ANDREW TILLERY IMPRESSIONS  1. Global hypokinesis with akinesis of the septum and aneurysm formation of apex;overall severe LV dysfunction.  2. Left ventricular ejection fraction, by estimation, is 20 to 25%. The left ventricle has severely decreased function. The left ventricle demonstrates regional wall motion abnormalities (see scoring diagram/findings for description). The left ventricular internal cavity size was mildly dilated. Left ventricular diastolic parameters are consistent with Grade I diastolic dysfunction (impaired relaxation).  3. Right ventricular systolic function is normal. The right ventricular size is normal.  4. The mitral valve is normal in structure. No evidence of mitral valve regurgitation. No evidence of mitral stenosis.  5. The aortic valve is tricuspid.  Aortic valve regurgitation is not visualized. Aortic valve sclerosis is present, with no evidence of aortic valve stenosis.  6. The inferior vena cava is normal in size with greater than 50% respiratory variability, suggesting right atrial pressure of 3 mmHg. FINDINGS  Left Ventricle: Left ventricular ejection fraction, by estimation, is 20 to 25%. The left ventricle has severely decreased function. The left ventricle demonstrates regional wall motion abnormalities. Definity contrast agent was given IV to delineate the left ventricular endocardial borders. The left ventricular internal cavity size was mildly dilated. There is no left ventricular hypertrophy. Left ventricular diastolic parameters are consistent with Grade I diastolic dysfunction (impaired relaxation). Right Ventricle: The right ventricular size is normal. Right ventricular systolic function is normal. Left Atrium: Left atrial size was normal in size. Right Atrium: Right atrial size was normal in size. Pericardium: Trivial pericardial effusion is present. Mitral Valve: The mitral valve is normal in structure. Mild mitral annular calcification. No evidence of mitral valve regurgitation. No evidence of mitral valve stenosis. Tricuspid Valve: The tricuspid valve is normal in structure. Tricuspid valve regurgitation is trivial. No evidence of tricuspid stenosis. Aortic Valve: The aortic valve is tricuspid. Aortic valve regurgitation is not visualized. Aortic valve sclerosis is present, with no evidence of aortic valve stenosis. Aortic valve peak gradient measures 4.1 mmHg. Pulmonic Valve: The pulmonic valve was normal in structure. Pulmonic valve regurgitation is not visualized. No evidence of pulmonic stenosis. Aorta: The aortic root is normal in size and structure. Venous: The inferior vena cava is normal in size with greater than 50% respiratory variability, suggesting right atrial pressure of 3 mmHg. IAS/Shunts: No atrial level shunt detected by color  flow Doppler. Additional Comments: Global hypokinesis with akinesis of the septum and aneurysm formation of apex;overall severe LV dysfunction. A device lead is visualized.  LEFT VENTRICLE PLAX 2D LVIDd:         5.90 cm      Diastology LVIDs:         4.80 cm      LV  e' medial:   5.44 cm/s LV PW:         1.00 cm      LV E/e' medial: 8.1 LV IVS:        0.80 cm LVOT diam:     1.80 cm LV SV:         39 LV SV Index:   21 LVOT Area:     2.54 cm  LV Volumes (MOD) LV vol d, MOD A2C: 153.5 ml LV vol d, MOD A4C: 207.0 ml LV vol s, MOD A2C: 109.8 ml LV vol s, MOD A4C: 139.0 ml LV SV MOD A2C:     43.8 ml LV SV MOD A4C:     207.0 ml LV SV MOD BP:      53.6 ml RIGHT VENTRICLE             IVC RV Basal diam:  3.10 cm     IVC diam: 0.90 cm RV S prime:     11.20 cm/s TAPSE (M-mode): 1.3 cm LEFT ATRIUM             Index        RIGHT ATRIUM           Index LA diam:        3.75 cm 2.04 cm/m   RA Area:     14.20 cm LA Vol (A2C):   34.7 ml 18.87 ml/m  RA Volume:   38.00 ml  20.66 ml/m LA Vol (A4C):   29.4 ml 15.99 ml/m LA Biplane Vol: 34.2 ml 18.59 ml/m  AORTIC VALVE AV Area (Vmax): 2.08 cm AV Vmax:        101.00 cm/s AV Peak Grad:   4.1 mmHg LVOT Vmax:      82.43 cm/s LVOT Vmean:     50.800 cm/s LVOT VTI:       0.153 m  AORTA Ao Root diam: 2.90 cm Ao Asc diam:  3.00 cm MITRAL VALVE               TRICUSPID VALVE MV Area (PHT): 4.39 cm    TR Peak grad:   14.9 mmHg MV Decel Time: 173 msec    TR Vmax:        193.00 cm/s MV E velocity: 44.10 cm/s MV A velocity: 86.30 cm/s  SHUNTS MV E/A ratio:  0.51        Systemic VTI:  0.15 m                            Systemic Diam: 1.80 cm Olga Millers MD Electronically signed by Olga Millers MD Signature Date/Time: 06/03/2022/2:58:23 PM    Final    CT Chest W Contrast  Result Date: 06/03/2022 CLINICAL DATA:  Pleural effusion, malignancy suspected. EXAM: CT CHEST WITH CONTRAST TECHNIQUE: Multidetector CT imaging of the chest was performed during intravenous contrast administration.  RADIATION DOSE REDUCTION: This exam was performed according to the departmental dose-optimization program which includes automated exposure control, adjustment of the mA and/or kV according to patient size and/or use of iterative reconstruction technique. CONTRAST:  50mL OMNIPAQUE IOHEXOL 350 MG/ML SOLN COMPARISON:  10/13/2021. FINDINGS: Cardiovascular: The heart is enlarged and there is a small pericardial effusion. Pacemaker leads are noted in the heart. Three-vessel coronary artery calcifications are noted. There is atherosclerotic calcification of the aorta without evidence of aneurysm. The pulmonary trunk is normal in caliber. Mediastinum/Nodes: Prominent lymph node is present in the subcarinal space  measuring 1 cm. No hilar or axillary lymphadenopathy is seen. The thyroid gland, trachea, and esophagus are within normal limits. There is a small hiatal hernia. Lungs/Pleura: Paraseptal and centrilobular emphysematous changes are present in the lungs. Debris is present in the left mainstem bronchus. There is bronchial wall thickening bilaterally with multifocal mucous plugging in the bilateral lower lobes, greater on the left than on the right. Atelectasis is noted at the lung bases. There are trace bilateral pleural effusions. Trace bilateral pleural effusions are noted. No pneumothorax. There is redemonstration of a mass in the suprahilar region on the right measuring 4.1 x 4.1 cm, increased in size from the prior exam. Pleural and parenchymal scarring is noted bilaterally. Upper Abdomen: There is a 4.1 cm mass in the right adrenal gland containing calcifications, unchanged from 2009. Cysts are present in the right kidney. Subcentimeter hypodensities are present in the left kidney which are too small to further characterize. An aortic endograft is noted in the upper abdomen. No acute abnormality. Musculoskeletal: A pacemaker device is present in the anterior chest wall on the left. Degenerative changes are  present in the thoracic spine. No acute or suspicious osseous abnormality. IMPRESSION: 1. Interval enlargement of suprahilar mass on the right now measuring 4.1 x 4.1 cm, suspicious for neoplasm. PET-CT or biopsy is recommended for further evaluation. 2. Bilateral bronchial wall thickening with multifocal mucous plugging in the lower lobes bilaterally, greater on the left than on the right. 3. Emphysema. 4. Aortic atherosclerosis and coronary artery calcifications. Electronically Signed   By: Thornell Sartorius M.D.   On: 06/03/2022 01:28   DG Chest 2 View  Result Date: 06/02/2022 CLINICAL DATA:  Chest pain EXAM: CHEST - 2 VIEW COMPARISON:  01/22/2022 FINDINGS: Cardiac shadow is within normal limits. Defibrillator is again noted. Aortic calcifications are seen. Mild scarring is noted in the right apex stable from the prior exam. Fullness in the right hilar region is noted which corresponds to a soft tissue density seen on prior CT examination from 10/13/2021. This may represent enlarging right hilar mass. No bony abnormality is noted. No other focal abnormality is seen. IMPRESSION: Prominent right hilum with focal mass lesion on the lateral projection similar to that seen on prior CT examination. Again this is suspicious for neoplasm. Follow-up CT is recommended for further evaluation. Electronically Signed   By: Alcide Clever M.D.   On: 06/02/2022 23:32

## 2022-06-04 NOTE — Progress Notes (Signed)
IVT consult for new PIV placement. Upon arrival, patients current PIV had significant infiltration of amio gtt with medication continuing to run. Paused medication and removed PIV. Requested primary RN to contact pharmacy and notify provider to determine if treatment was needed. 2/3 infiltration score.   2 PIV's placed. Patient instructed to notify team if he notices any burning with any PIV's.   Ailene Royal Loyola Mast, RN

## 2022-06-05 ENCOUNTER — Telehealth (HOSPITAL_COMMUNITY): Payer: Self-pay | Admitting: Pharmacy Technician

## 2022-06-05 ENCOUNTER — Inpatient Hospital Stay (HOSPITAL_COMMUNITY): Payer: Medicare HMO

## 2022-06-05 ENCOUNTER — Other Ambulatory Visit (HOSPITAL_COMMUNITY): Payer: Self-pay

## 2022-06-05 DIAGNOSIS — R918 Other nonspecific abnormal finding of lung field: Secondary | ICD-10-CM | POA: Diagnosis not present

## 2022-06-05 DIAGNOSIS — I5020 Unspecified systolic (congestive) heart failure: Secondary | ICD-10-CM | POA: Diagnosis not present

## 2022-06-05 DIAGNOSIS — I10 Essential (primary) hypertension: Secondary | ICD-10-CM | POA: Diagnosis not present

## 2022-06-05 DIAGNOSIS — I472 Ventricular tachycardia, unspecified: Secondary | ICD-10-CM | POA: Diagnosis not present

## 2022-06-05 LAB — BASIC METABOLIC PANEL
Anion gap: 6 (ref 5–15)
BUN: <5 mg/dL — ABNORMAL LOW (ref 8–23)
CO2: 24 mmol/L (ref 22–32)
Calcium: 8.1 mg/dL — ABNORMAL LOW (ref 8.9–10.3)
Chloride: 105 mmol/L (ref 98–111)
Creatinine, Ser: 0.9 mg/dL (ref 0.61–1.24)
GFR, Estimated: >60 mL/min (ref >60)
Glucose, Bld: 99 mg/dL (ref 70–99)
Potassium: 4.1 mmol/L (ref 3.5–5.1)
Sodium: 135 mmol/L (ref 135–145)

## 2022-06-05 LAB — IRON AND TIBC
Iron: 10 ug/dL — ABNORMAL LOW (ref 45–182)
Saturation Ratios: 4 % — ABNORMAL LOW (ref 17.9–39.5)
TIBC: 228 ug/dL — ABNORMAL LOW (ref 250–450)
UIBC: 218 ug/dL

## 2022-06-05 LAB — RETICULOCYTES
Immature Retic Fract: 12.7 % (ref 2.3–15.9)
RBC.: 3.96 MIL/uL — ABNORMAL LOW (ref 4.22–5.81)
Retic Count, Absolute: 43 10*3/uL (ref 19.0–186.0)
Retic Ct Pct: 1.1 % (ref 0.4–3.1)

## 2022-06-05 LAB — MAGNESIUM: Magnesium: 2.2 mg/dL (ref 1.7–2.4)

## 2022-06-05 LAB — FERRITIN: Ferritin: 45 ng/mL (ref 24–336)

## 2022-06-05 LAB — VITAMIN B12: Vitamin B-12: 806 pg/mL (ref 180–914)

## 2022-06-05 MED ORDER — IOHEXOL 350 MG/ML SOLN
75.0000 mL | Freq: Once | INTRAVENOUS | Status: AC | PRN
  Administered 2022-06-05: 75 mL via INTRAVENOUS

## 2022-06-05 MED ORDER — SODIUM CHLORIDE 0.9 % IV SOLN
125.0000 mg | Freq: Every day | INTRAVENOUS | Status: AC
  Administered 2022-06-05: 125 mg via INTRAVENOUS
  Filled 2022-06-05 (×2): qty 10

## 2022-06-05 NOTE — Care Plan (Signed)
Discrepancy between old DNR form and current code status noted.  Discussed with patient, who doesn't understand concept of DNR or "code status".  Discussed with wife, who believes he would "want everything done."  Will leave FULL CODE.

## 2022-06-05 NOTE — Progress Notes (Signed)
  Progress Note   Patient: Benjamin Cook ZOX:[id number] DOB: 1945/03/13 DOA: 06/02/2022     2 DOS: the patient was seen and examined on 06/05/2022 at 11:20AM      Brief hospital course: Mr. Dyar is a 77 y.o. M with hx Afib on Xarelto, VT s/p ablation and ICD placement, HTN, COPD, sCHF E20-25%, and known adrenal mass who presented with chest pain and near syncope found to have symptomatic VT and a new lung mass.     Assessment and Plan: Mass of right lung -Hold DOAC for EBUS on Friday  VT (ventricular tachycardia) ([name]) -Continue IV amiodarone infusion - Continue metoprolol  Essential hypertension -Hold home Coreg - Continue new metoprolol -Continue Entresto  HFrEF (heart failure with reduced ejection fraction) ([name]) Appears compensated - Continue metoprolol, Entresto, Imdur  Paroxysmal atrial fibrillation ([name]) -Hold Xarelto - Continue metoprolol  Hyperlipidemia - Continue atorvastatin  Iron deficiency anemia due to chronic blood loss Hgb stably low - IV iron      Subjective: Patient is tired, but no focal chest pain, dyspnea, confusion.     Physical Exam: BP 130/76 (BP Location: Left Arm)   Pulse 60   Temp 98.2 F (36.8 C) (Oral)   Resp 18   Ht 5\' 11"  (1.803 m)   Wt 65.8 kg   SpO2 96%   BMI 20.22 kg/m   Adult male, lying in bed, no acute distress RRR, no murmurs, no peripheral edema Respiratory rate normal, lungs clear without rales or wheezes Abdomen soft without tenderness palpation or guarding, no ascites or distention Attention normal, affect normal, judgment insight appear normal    Data Reviewed: Telemetry shows occasional PVCs Iron studies show iron deficiency Basic metabolic panel unremarkable Microcytic anemia noted on CBC 2 days ago   Family Communication: Wife at the bedside    Disposition: Status is: Inpatient Plan for EBUS on Friday and then home after        Author: Alberteen Sam, MD 06/05/2022 2:38  PM  For on call review [email or web address].

## 2022-06-05 NOTE — Progress Notes (Signed)
  Patient Name: Benjamin Cook Date of Encounter: 06/05/2022  Primary Cardiologist: Olga Millers, MD Electrophysiologist: [name], MD  Interval Summary   The patient is doing well today.  At this time, the patient denies chest pain, shortness of breath, or any new concerns.  Inpatient Medications    Scheduled Meds:  atorvastatin  80 mg Oral q1800   isosorbide mononitrate  15 mg Oral Daily   metoprolol succinate  50 mg Oral BID   mometasone-formoterol  2 puff Inhalation BID   sacubitril-valsartan  1 tablet Oral BID   Continuous Infusions:  amiodarone 30 mg/hr (06/05/22 0454)   PRN Meds: acetaminophen, ipratropium-albuterol, nitroGLYCERIN, ondansetron (ZOFRAN) IV   Vital Signs    Vitals:   06/04/22 1226 06/04/22 1946 06/04/22 2010 06/05/22 0430  BP: 132/66  (!) 139/58 (!) 140/70  Pulse: 65 68 65 (!) 57  Resp: 18 18 18 18   Temp: 97.7 F (36.5 C)  98.7 F (37.1 C) 98.4 F (36.9 C)  TempSrc: Oral  Oral Oral  SpO2: 99% 100% 98% 99%  Weight:      Height:        Intake/Output Summary (Last 24 hours) at 06/05/2022 0738 Last data filed at 06/05/2022 0431 Gross per 24 hour  Intake 591.98 ml  Output 650 ml  Net -58.02 ml   Filed Weights   06/02/22 2200  Weight: 65.8 kg    Physical Exam    GEN- The patient is well appearing, alert and oriented x 3 today.   Lungs- Clear to ausculation bilaterally, normal work of breathing Cardiac- Regular rate and rhythm, no murmurs, rubs or gallops GI- soft, NT, ND, + BS Extremities- no clubbing or cyanosis. No edema  Telemetry    NSR 60-80s with now rare PVCs. No further frequent NSVT (personally reviewed)  Hospital Course    Benjamin Cook is a 77 y.o. male with a history of PAF, VT s/p ICD placement, VT ablation 10/2020, Chronic systolic CHF, Anemia, lung nodule who is being seen today for the evaluation of VT at the request of Dr. Mayford Knife.   Assessment & Plan    Recurrent VT S/p ablation 10/2020 Had previously  been quiescent s/p ablation apart from episode 01/2022  Amiodarone previously stopped 04/2022 for ESR+. No clear symptoms were documented.  Ectopy and NSVT much improved on toprol and with continued amio load.  Continue IV amiodarone for now pending EBUS Friday.  Continue toprol 50 mg BID.  Potassium4.1 (05/01 0427) Magnesium  2.2 (05/01 0427) Creatinine, ser  0.90 (05/01 0427) Keep K > 4.0 and Mg > 2.0    Chronic systolic CHF Echo 4/29 LVEF 20-25%, global HK with akinesis of the septum and aneurysm formation of apex, normal RV, no significant valvular disease.  Denies s/s ischemia, HS trop flat and near baseline Continue statin Continue GDMT   CAD h/o LAD stent HS trop negative as above.  Mod to severe non-obstructive disease 10/2020 No CP   HTN Follow with HF med adjustment.    Afib Burden low chronically Xarelto on hold for procedures.    Lung mass Highly concerning for neoplasm given size change from 10/2021 Planning EBUS Friday.   For questions or updates, please contact [facility] Please consult [email or web address] for contact info under Cardiology/STEMI.  Signed, Graciella Freer, PA-C  06/05/2022, 7:38 AM

## 2022-06-05 NOTE — Telephone Encounter (Signed)
Pharmacy Patient Advocate Encounter  Insurance verification completed.    The patient is insured through [employer] Part D   The patient is currently admitted and ran test claims for the following: Farxiga, Jardiance.  Copays and coinsurance results were relayed to Inpatient clinical team.  

## 2022-06-05 NOTE — Hospital Course (Signed)
Benjamin Cook is a 77 y.o. M with hx Afib on Xarelto, VT s/p ablation and ICD placement, HTN, COPD, sCHF E20-25%, and known adrenal mass who presented with chest pain and near syncope found to have symptomatic VT and a new lung mass.

## 2022-06-05 NOTE — Progress Notes (Signed)
Piv attempted to rt upper arm, unsuccessful. Pressure drsg applied. Iv team consult ordered.

## 2022-06-05 NOTE — TOC Benefit Eligibility Note (Signed)
Patient Product/process development scientist completed.    The patient is currently admitted and upon discharge could be taking Farxiga 10 mg.  The current 30 day co-pay is $47.00.   The patient is currently admitted and upon discharge could be taking Jardiance 10 mg.  The current 30 day co-pay is $47.00.   The patient is insured through [employer] part D   This test claim was processed through [facility]- copay amounts may vary at other pharmacies due to pharmacy/plan contracts, or as the patient moves through the different stages of their insurance plan.  Roland Earl, CPHT Pharmacy Patient Advocate Specialist [facility] Patient Advocate Team Direct Number: [phone]  Fax: [phone]

## 2022-06-05 NOTE — Plan of Care (Signed)

## 2022-06-06 ENCOUNTER — Telehealth: Payer: Self-pay | Admitting: Hematology and Oncology

## 2022-06-06 ENCOUNTER — Inpatient Hospital Stay (HOSPITAL_COMMUNITY): Payer: Medicare HMO

## 2022-06-06 DIAGNOSIS — I5022 Chronic systolic (congestive) heart failure: Secondary | ICD-10-CM | POA: Diagnosis not present

## 2022-06-06 DIAGNOSIS — I472 Ventricular tachycardia, unspecified: Secondary | ICD-10-CM | POA: Diagnosis not present

## 2022-06-06 DIAGNOSIS — D509 Iron deficiency anemia, unspecified: Secondary | ICD-10-CM

## 2022-06-06 DIAGNOSIS — I10 Essential (primary) hypertension: Secondary | ICD-10-CM | POA: Diagnosis not present

## 2022-06-06 DIAGNOSIS — J449 Chronic obstructive pulmonary disease, unspecified: Secondary | ICD-10-CM

## 2022-06-06 DIAGNOSIS — R918 Other nonspecific abnormal finding of lung field: Secondary | ICD-10-CM | POA: Diagnosis not present

## 2022-06-06 DIAGNOSIS — E785 Hyperlipidemia, unspecified: Secondary | ICD-10-CM

## 2022-06-06 LAB — MAGNESIUM: Magnesium: 2 mg/dL (ref 1.7–2.4)

## 2022-06-06 LAB — BASIC METABOLIC PANEL
Anion gap: 5 (ref 5–15)
BUN: 7 mg/dL — ABNORMAL LOW (ref 8–23)
CO2: 24 mmol/L (ref 22–32)
Calcium: 8.1 mg/dL — ABNORMAL LOW (ref 8.9–10.3)
Chloride: 107 mmol/L (ref 98–111)
Creatinine, Ser: 0.76 mg/dL (ref 0.61–1.24)
GFR, Estimated: >60 mL/min (ref >60)
Glucose, Bld: 106 mg/dL — ABNORMAL HIGH (ref 70–99)
Potassium: 3.9 mmol/L (ref 3.5–5.1)
Sodium: 136 mmol/L (ref 135–145)

## 2022-06-06 LAB — CBC
HCT: 29.3 % — ABNORMAL LOW (ref 39.0–52.0)
Hemoglobin: 9.7 g/dL — ABNORMAL LOW (ref 13.0–17.0)
MCH: 24.9 pg — ABNORMAL LOW (ref 26.0–34.0)
MCHC: 33.1 g/dL (ref 30.0–36.0)
MCV: 75.1 fL — ABNORMAL LOW (ref 80.0–100.0)
Platelets: 268 10*3/uL (ref 150–400)
RBC: 3.9 MIL/uL — ABNORMAL LOW (ref 4.22–5.81)
RDW: 19.2 % — ABNORMAL HIGH (ref 11.5–15.5)
WBC: 7.2 10*3/uL (ref 4.0–10.5)
nRBC: 0 % (ref 0.0–0.2)

## 2022-06-06 LAB — SARS CORONAVIRUS 2 BY RT PCR: SARS Coronavirus 2 by RT PCR: NEGATIVE

## 2022-06-06 NOTE — Progress Notes (Addendum)
Patient Name: Benjamin Cook Date of Encounter: 06/06/2022  Primary Cardiologist: Olga Millers, MD Electrophysiologist: [name], MD  Interval Summary   The patient is doing well today.  He reports a "funny" pain center of chest, just comes and goes, though none here  Inpatient Medications    Scheduled Meds:  atorvastatin  80 mg Oral q1800   isosorbide mononitrate  15 mg Oral Daily   metoprolol succinate  50 mg Oral BID   mometasone-formoterol  2 puff Inhalation BID   sacubitril-valsartan  1 tablet Oral BID   Continuous Infusions:  amiodarone 30 mg/hr (06/06/22 0656)   ferric gluconate (FERRLECIT) IVPB Stopped (06/05/22 0945)   PRN Meds: acetaminophen, ipratropium-albuterol, nitroGLYCERIN, ondansetron (ZOFRAN) IV   Vital Signs    Vitals:   06/05/22 2108 06/06/22 0045 06/06/22 0654 06/06/22 0802  BP:  (!) 148/97 129/67   Pulse:  74 60   Resp:  17 20   Temp:  97.6 F (36.4 C) 98.3 F (36.8 C)   TempSrc:  Oral Oral   SpO2: 97% 95% 94% 96%  Weight:  90.6 kg    Height:  5\' 11"  (1.803 m)      Intake/Output Summary (Last 24 hours) at 06/06/2022 0810 Last data filed at 06/06/2022 0500 Gross per 24 hour  Intake 415.02 ml  Output 680 ml  Net -264.98 ml   Filed Weights   06/02/22 2200 06/06/22 0045  Weight: 65.8 kg 90.6 kg    Physical Exam    Unchanged exam GEN- The patient is well appearing, alert and oriented x 3 today.   Lungs- Clear to ausculation bilaterally, normal work of breathing Cardiac- Regular rate and rhythm, no murmurs, rubs or gallops GI- soft, NT, ND Extremities- no clubbing or cyanosis. No edema  Telemetry    NSR 60-80s with now rare PVCs. No further frequent NSVT (personally reviewed)  CARDIAC DATA  06/03/22: TTE 1. Global hypokinesis with akinesis of the septum and aneurysm formation  of apex;overall severe LV dysfunction.   2. Left ventricular ejection fraction, by estimation, is 20 to 25%. The  left ventricle has severely  decreased function. The left ventricle  demonstrates regional wall motion abnormalities (see scoring  diagram/findings for description). The left  ventricular internal cavity size was mildly dilated. Left ventricular  diastolic parameters are consistent with Grade I diastolic dysfunction  (impaired relaxation).   3. Right ventricular systolic function is normal. The right ventricular  size is normal.   4. The mitral valve is normal in structure. No evidence of mitral valve  regurgitation. No evidence of mitral stenosis.   5. The aortic valve is tricuspid. Aortic valve regurgitation is not  visualized. Aortic valve sclerosis is present, with no evidence of aortic  valve stenosis.   6. The inferior vena cava is normal in size with greater than 50%  respiratory variability, suggesting right atrial pressure of 3 mmHg.   Hospital Course    Benjamin Cook is a 77 y.o. male with a history of PAF, VT s/p ICD placement, VT ablation 10/2020, Chronic systolic CHF, Anemia, lung nodule who is being seen today for the evaluation of VT at the request of Dr. Mayford Knife.   Device information Abbott dual chamber ICD implanted 07/18/2014 RA lead is from 05/25/2010 He has an abandoned RV pacing lead  Assessment & Plan    Recurrent VT S/p ablation 10/2020 Had previously been quiescent s/p ablation apart from episode 01/2022  Amiodarone previously stopped 04/2022 for ESR+. No clear symptoms  were documented.  Ectopy and NSVT well suppressed on toprol and amio.  Continue IV amiodarone for now pending EBUS Friday, consider transition to PO once procedures completed  Lytes look ok   Chronic systolic CHF Echo 4/29 LVEF 20-25%, global HK with akinesis of the septum and aneurysm formation of apex, normal RV, no significant valvular disease.  HS trop flat and near baseline Continue statin Continue GDMT   CAD h/o LAD stent HS trop negative as above.  Mod to severe non-obstructive disease 10/2020 CP not  typical of angina   HTN Follow with HF med adjustment.    Afib Burden low chronically Maintaining SR here Xarelto on hold for procedures.    Lung mass Highly concerning for neoplasm given size change from 10/2021 Planning EBUS Friday/tomorrow  For questions or updates, please contact [facility] Please consult [email or web address] for contact info under Cardiology/STEMI.  Signed, Sheilah Pigeon, PA-C  06/06/2022, 8:10 AM

## 2022-06-06 NOTE — Progress Notes (Signed)
  Progress Note   Patient: Benjamin Cook IEP:[id number] DOB: 1945-06-22 DOA: 06/02/2022     3 DOS: the patient was seen and examined on 06/06/2022 at 9:47AM      Brief hospital course: Mr. Biello is a 77 y.o. M with hx Afib on Xarelto, VT s/p ablation and ICD placement, HTN, COPD, sCHF E20-25%, and known adrenal mass who presented with chest pain and near syncope found to have symptomatic VT and a new lung mass.     Assessment and Plan: * Ventricular tachyarrhythmia ([name]) - IV amiodarone per Cardiology - Continue metoprolol    Mass of right lung Now up to 4cm.  Pulmonary consulted, recommended EBUS.  Oncology consulted, recommended outpatient follow up next week. - EBUS tomorrow - Hold DOAC     Essential hypertension BP controlled - Continue Imdur, metop, Entresto  ICD (implantable cardioverter-defibrillator) in place    Chronic systolic CHF (congestive heart failure) ([name]) Ischemic cardiomyopathy.  EF 20-25% this hospitalization.  Appears euvolemic - Continue Imdur, metop, Entresto - Defer spironolactone to cardiology - Not on diuretic at home   Anemia, iron deficiency Hgb stable 9.7 g/dL - Finish IV iron  AAA (abdominal aortic aneurysm) ([name]) S/P endograft in 2019 with Dr. Randie Heinz.  No follow up since 2019 with Vascular.  Reports vague lower abdominal pain here.  No imaging in our system since 2022.  Discussed with [facility], they recommend US duplex to start - Obtain US duplex AAA  Paroxysmal atrial fibrillation ([name]) - Hold Xarelto - Continue amiodarone, dose/route per Cardiology - Continue metoprolol  COPD, severe ([name]) No flare - Continue home Dulera  HLD (hyperlipidemia) - Continue atorvastatin          Subjective: Feeling unchanged.  Left arm still swollen, reddish.  Family think it is not warm, I think it is warm.  No chest pain, no syncope.  Vague lower abdominal pain.  Frequent ectopy on tele.     Physical Exam: BP (!) 140/59   Pulse 63    Temp 98.3 F (36.8 C) (Oral)   Resp 20   Ht 5\' 11"  (1.803 m)   Wt 90.6 kg   SpO2 96%   BMI 27.85 kg/m   Frail elderly adult male, sitting in the edge of the bed, interactive and appropriate RRR with occasional premature beats, I do not appreciate murmurs, no peripheral edema Respiratory rate seems normal, lungs clear without rales or wheezes Abdomen soft, vague abdominal discomfort, no focal tenderness to palpation, no rigidity or rebound Attention normal, affect blunted by age and fatigue, oriented to person, place, time, generalized weakness The left arm has some edematous area around prior IV, seems warm to me, slightly reddish    Data Reviewed: Telemetry shows nonsustained ventricular tachycardia, frequent ectopy Patient metabolic panel unchanged CBC unchanged with hemoglobin 9.7 CT angiogram of the head and neck showed no evidence of metastatic disease    Family Communication: Wife and daughter at the bedside    Disposition: Status is: Inpatient         Author: Alberteen Sam, MD 06/06/2022 11:28 AM  For on call review [email or web address].

## 2022-06-06 NOTE — Assessment & Plan Note (Addendum)
Hgb stable 9.7 g/dL.  Administered IV iron.

## 2022-06-06 NOTE — Plan of Care (Signed)
  Problem: Education: Goal: Understanding of cardiac disease, CV risk reduction, and recovery process will improve Outcome: Progressing Goal: Individualized Educational Video(s) Outcome: Progressing   Problem: Activity: Goal: Ability to tolerate increased activity will improve Outcome: Progressing   Problem: Cardiac: Goal: Ability to achieve and maintain adequate cardiovascular perfusion will improve Outcome: Progressing   

## 2022-06-06 NOTE — Telephone Encounter (Signed)
Spoke with patient confirming upcoming appointments  

## 2022-06-06 NOTE — Care Management Important Message (Signed)
Important Message  Patient Details  Name: Benjamin Cook MRN: [id number] Date of Birth: 01/28/1946   Medicare Important Message Given:  Yes     Renie Ora 06/06/2022, 9:50 AM

## 2022-06-06 NOTE — Assessment & Plan Note (Signed)
No flare - Continue home [facility]

## 2022-06-06 NOTE — Assessment & Plan Note (Addendum)
S/P endograft in 2019 with Dr. Randie Heinz.  No follow up since 2019 with Vascular.  Reports vague lower abdominal pain here.  No imaging in our system since 2022.  Discussed with [facility], obtained US AAA that showed up to 6.1 - Vascuar surgery follow up at d/c - [facility] to obtain CTA outpateint

## 2022-06-06 NOTE — Assessment & Plan Note (Signed)
Continue atorvastatin

## 2022-06-07 ENCOUNTER — Encounter (HOSPITAL_COMMUNITY)
Admission: EM | Disposition: A | Discharge status: Home Health | Payer: Self-pay | Source: Home / Self Care | Attending: Family Medicine

## 2022-06-07 ENCOUNTER — Encounter (HOSPITAL_COMMUNITY): Payer: Self-pay | Admitting: Anesthesiology

## 2022-06-07 DIAGNOSIS — I891 Lymphangitis: Secondary | ICD-10-CM | POA: Insufficient documentation

## 2022-06-07 DIAGNOSIS — I10 Essential (primary) hypertension: Secondary | ICD-10-CM | POA: Diagnosis not present

## 2022-06-07 DIAGNOSIS — I714 Abdominal aortic aneurysm, without rupture, unspecified: Secondary | ICD-10-CM

## 2022-06-07 DIAGNOSIS — I5020 Unspecified systolic (congestive) heart failure: Secondary | ICD-10-CM | POA: Diagnosis not present

## 2022-06-07 DIAGNOSIS — I472 Ventricular tachycardia, unspecified: Secondary | ICD-10-CM | POA: Diagnosis not present

## 2022-06-07 DIAGNOSIS — R918 Other nonspecific abnormal finding of lung field: Secondary | ICD-10-CM | POA: Diagnosis not present

## 2022-06-07 LAB — CBC
HCT: 29.5 % — ABNORMAL LOW (ref 39.0–52.0)
Hemoglobin: 9.6 g/dL — ABNORMAL LOW (ref 13.0–17.0)
MCH: 24.9 pg — ABNORMAL LOW (ref 26.0–34.0)
MCHC: 32.5 g/dL (ref 30.0–36.0)
MCV: 76.6 fL — ABNORMAL LOW (ref 80.0–100.0)
Platelets: 288 10*3/uL (ref 150–400)
RBC: 3.85 MIL/uL — ABNORMAL LOW (ref 4.22–5.81)
RDW: 19.2 % — ABNORMAL HIGH (ref 11.5–15.5)
WBC: 7.8 10*3/uL (ref 4.0–10.5)
nRBC: 0 % (ref 0.0–0.2)

## 2022-06-07 LAB — MAGNESIUM: Magnesium: 1.7 mg/dL (ref 1.7–2.4)

## 2022-06-07 LAB — BASIC METABOLIC PANEL
Anion gap: 9 (ref 5–15)
BUN: <5 mg/dL — ABNORMAL LOW (ref 8–23)
CO2: 23 mmol/L (ref 22–32)
Calcium: 8.1 mg/dL — ABNORMAL LOW (ref 8.9–10.3)
Chloride: 102 mmol/L (ref 98–111)
Creatinine, Ser: 0.84 mg/dL (ref 0.61–1.24)
GFR, Estimated: >60 mL/min (ref >60)
Glucose, Bld: 93 mg/dL (ref 70–99)
Potassium: 3.7 mmol/L (ref 3.5–5.1)
Sodium: 134 mmol/L — ABNORMAL LOW (ref 135–145)

## 2022-06-07 SURGERY — BRONCHOSCOPY, WITH EBUS
Anesthesia: General

## 2022-06-07 MED ORDER — SPIRONOLACTONE 25 MG PO TABS
25.0000 mg | ORAL_TABLET | Freq: Every day | ORAL | Status: DC
  Administered 2022-06-07 – 2022-06-08 (×2): 25 mg via ORAL
  Filled 2022-06-07 (×2): qty 1

## 2022-06-07 MED ORDER — AMIODARONE HCL 200 MG PO TABS
400.0000 mg | ORAL_TABLET | Freq: Two times a day (BID) | ORAL | Status: DC
  Administered 2022-06-07 – 2022-06-08 (×3): 400 mg via ORAL
  Filled 2022-06-07 (×3): qty 2

## 2022-06-07 MED ORDER — CEFADROXIL 500 MG PO CAPS
500.0000 mg | ORAL_CAPSULE | Freq: Two times a day (BID) | ORAL | Status: DC
  Administered 2022-06-07 – 2022-06-08 (×3): 500 mg via ORAL
  Filled 2022-06-07 (×4): qty 1

## 2022-06-07 MED ORDER — RIVAROXABAN 20 MG PO TABS
20.0000 mg | ORAL_TABLET | Freq: Every day | ORAL | Status: DC
  Administered 2022-06-07: 20 mg via ORAL
  Filled 2022-06-07: qty 1

## 2022-06-07 NOTE — Evaluation (Signed)
Physical Therapy Evaluation and Discharge  Patient Details Name: YEFRI VANVLIET MRN: [id number] DOB: [birth year] Today's Date: 06/07/2022  History of Present Illness  Patient is a 77 year old male presenting with chest pain and near syncope found to have symptomatic VT and a new lung mass. History of Afib, VT s/p ablation, ICD, HTN, COPD, CHF, adrenal mass.  Clinical Impression  Patient is agreeable to PT evaluation. He lives with his spouse in a single story home. Patient reports he has dyspnea with exertion when walking more than 73ft at baseline and does not typically use DME for ambulation.  Today the patient is modified independent with all activity. He walked 168ft in the hallway with no device and only mild dyspnea reported with activity. He feels he is near his baseline level of functional mobility. Encouraged routine, short distance ambulation for conditioning at home and monitor for signs of fatigue.  No further acute PT needs at this time.      Recommendations for follow up therapy are one component of a multi-disciplinary discharge planning process, led by the attending physician.  Recommendations may be updated based on patient status, additional functional criteria and insurance authorization.  Follow Up Recommendations       Assistance Recommended at Discharge PRN  Patient can return home with the following  Assist for transportation    Equipment Recommendations None recommended by PT  Recommendations for Other Services       Functional Status Assessment Patient has not had a recent decline in their functional status     Precautions / Restrictions Precautions Precautions: None Restrictions Weight Bearing Restrictions: No      Mobility  Bed Mobility Overal bed mobility: Modified Independent Bed Mobility: Supine to Sit, Sit to Supine     Supine to sit: Modified independent (Device/Increase time) Sit to supine: Modified independent (Device/Increase time)         Transfers Overall transfer level: Modified independent                 General transfer comment: standing from bed and from toilet    Ambulation/Gait Ambulation/Gait assistance: Modified independent (Device/Increase time) Gait Distance (Feet): 100 Feet Assistive device: None Gait Pattern/deviations: Step-through pattern Gait velocity: decreased     General Gait Details: no loss of balance with hallway ambulation. mild self reported dyspnea after ambulation which patient reports is normal for him after walking around 66ft  Stairs            Wheelchair Mobility    Modified Rankin (Stroke Patients Only)       Balance Overall balance assessment: Needs assistance Sitting-balance support: Feet supported, No upper extremity supported Sitting balance-Leahy Scale: Good     Standing balance support: No upper extremity supported, During functional activity Standing balance-Leahy Scale: Fair                               Pertinent Vitals/Pain Pain Assessment Pain Assessment: No/denies pain    Home Living Family/patient expects to be discharged to:: Private residence Living Arrangements: Spouse/significant other Available Help at Discharge: Family;Available 24 hours/day Type of Home: House Home Access: Level entry       Home Layout: One level Home Equipment: Agricultural consultant (2 wheels);Cane - single point      Prior Function Prior Level of Function : Independent/Modified Independent             Mobility Comments: dyspnea with walking 75  ft or more ADLs Comments: wife helps him stand from the tub as he prefers tub bath     Hand Dominance        Extremity/Trunk Assessment   Upper Extremity Assessment Upper Extremity Assessment: Overall WFL for tasks assessed    Lower Extremity Assessment Lower Extremity Assessment: Overall WFL for tasks assessed (endurance impaired for sustianed activity)       Communication    Communication: No difficulties  Cognition Arousal/Alertness: Awake/alert Behavior During Therapy: WFL for tasks assessed/performed Overall Cognitive Status: Within Functional Limits for tasks assessed                                          General Comments General comments (skin integrity, edema, etc.): heart rate monitored throughout session. encouraged patient to perform routine ambulation for conditioning at home    Exercises     Assessment/Plan    PT Assessment Patient does not need any further PT services  PT Problem List         PT Treatment Interventions      PT Goals (Current goals can be found in the Care Plan section)  Acute Rehab PT Goals PT Goal Formulation: All assessment and education complete, DC therapy    Frequency       Co-evaluation               AM-PAC PT "6 Clicks" Mobility  Outcome Measure Help needed turning from your back to your side while in a flat bed without using bedrails?: None Help needed moving from lying on your back to sitting on the side of a flat bed without using bedrails?: None Help needed moving to and from a bed to a chair (including a wheelchair)?: None Help needed standing up from a chair using your arms (e.g., wheelchair or bedside chair)?: None Help needed to walk in hospital room?: None Help needed climbing 3-5 steps with a railing? : None 6 Click Score: 24    End of Session   Activity Tolerance: Patient tolerated treatment well Patient left: in bed;with call bell/phone within reach;with family/visitor present   PT Visit Diagnosis: Muscle weakness (generalized) (M62.81)    Time: 5621-3086 PT Time Calculation (min) (ACUTE ONLY): 12 min   Charges:   PT Evaluation $PT Eval Low Complexity: 1 Low          Donna Bernard, PT, MPT   Ina Homes 06/07/2022, 10:34 AM

## 2022-06-07 NOTE — Consult Note (Signed)
Hospital Consult    Reason for Consult: Enlarging abdominal aortic aneurysm Referring Physician: Dr. Maryfrances Bunnell MRN #:  [id number]  History of Present Illness: This is a 77 y.o. male with history of EVAR for large abdominal aortic aneurysm performed in 2019.  Aneurysm originally found on CT scan for diverticulitis.  Last follow-up was later in 2019 he has had CT scans since the last visit.  He denies any back or abdominal pain now.  He is admitted with chest pain and syncopal event and was found to have a new lung mass actually plan for endobronchial ultrasound today.  He is taking Xarelto for atrial fibrillation.  Past Medical History:  Diagnosis Date   Adrenal mass ([name])    per pt this is remote (10 years) and benign by biopsy   AICD (automatic cardioverter/defibrillator) present    Anxiety    Arthritis    CAD (coronary artery disease)    a. s/p PCI to LAD 2001 b. myoview 2014 high risk with scar LAD/RCA territory but no ischemia   Cardiomyopathy, ischemic    CHF (congestive heart failure) ([name])    class II/III   COPD (chronic obstructive pulmonary disease) ([name])    smokes cigars but has quit cigarettes   Defibrillator discharge 04/21/2019   Diverticulitis    Dyspnea    Flu 03/2015   HTN (hypertension)    Hyperlipidemia    NSVT (nonsustained ventricular tachycardia) ([name]) 03/05/2017   Paroxysmal atrial fibrillation ([name]) 03/2015   chads2vasc score is 4   PSA (psoriatic arthritis) ([name])    increased   PVD (peripheral vascular disease) ([name])     Past Surgical History:  Procedure Laterality Date   ABDOMINAL AORTIC ENDOVASCULAR STENT GRAFT N/A 03/03/2017   Procedure: ABDOMINAL AORTIC ENDOVASCULAR STENT GRAFT;  Surgeon: Maeola Harman, MD;  Location: [facility] OR;  Service: Vascular;  Laterality: N/A;   cataract surgery     CORONARY ANGIOGRAPHY N/A 10/12/2020   Procedure: CORONARY ANGIOGRAPHY;  Surgeon: Runell Gess, MD;  Location: [facility] INVASIVE CV LAB;  Service:  Cardiovascular;  Laterality: N/A;   CORONARY ANGIOPLASTY WITH STENT PLACEMENT  2001   a. PCI to LAD   IMPLANTABLE CARDIOVERTER DEFIBRILLATOR (ICD) GENERATOR CHANGE N/A 03/18/2014   a. SJM Fortify ST DR ICD implanted by [facility] for primary prevention b. gen change 03/2014    LEAD REVISION/REPAIR N/A 07/17/2016   Procedure: Lead Revision/Repair;  Surgeon: Marinus Maw, MD;  Location: [facility] INVASIVE CV LAB;  Service: Cardiovascular;  Laterality: N/A;   LEFT HEART CATHETERIZATION WITH CORONARY ANGIOGRAM N/A 05/27/2014   Procedure: LEFT HEART CATHETERIZATION WITH CORONARY ANGIOGRAM;  Surgeon: Tonny Bollman, MD;  Location: [facility] CATH LAB;  Service: Cardiovascular;  Laterality: N/A;   REPAIR KNEE LIGAMENT     V TACH ABLATION N/A 10/26/2020   Procedure: V TACH ABLATION;  Surgeon: Marinus Maw, MD;  Location: [facility] INVASIVE CV LAB;  Service: Cardiovascular;  Laterality: N/A;    No Known Allergies  Prior to Admission medications   Medication Sig Start Date End Date Taking? Authorizing Provider  acetaminophen (TYLENOL) 500 MG tablet Take 500-1,000 mg by mouth every 8 (eight) hours as needed (for pain).    Yes [provider]  atorvastatin (LIPITOR) 80 MG tablet TAKE 1 TABLET BY MOUTH DAILY AT 6 PM Patient taking differently: Take 80 mg by mouth daily. TAKE 1 TABLET BY MOUTH  DAILY AT 6 PM 11/23/21  Yes [name], MD  carvedilol (COREG) 6.25 MG tablet TAKE 1 TABLET  BY MOUTH TWICE A DAY WITH MEALS Patient taking differently: Take 6.25 mg by mouth 2 (two) times daily with a meal. 12/31/21  Yes Marinus Maw, MD  isosorbide mononitrate (IMDUR) 30 MG 24 hr tablet TAKE 1/2 OF A TABLET (15 MG TOTAL) BY MOUTH DAILY Patient taking differently: Take 30 mg by mouth daily. 12/18/21  Yes [name], MD  melatonin 5 MG TABS Take 1 tablet (5 mg total) by mouth at bedtime. 10/16/20  Yes Marinda Elk, MD  mometasone-formoterol (DULERA) 100-5 MCG/ACT AERO Inhale 2 puffs into the lungs in the  morning and at bedtime. 01/31/22  Yes Allred, Fayrene Fearing, MD  Multiple Vitamin (MULTIVITAMIN) capsule Take 1 capsule by mouth daily.   Yes [provider]  potassium chloride SA (KLOR-CON M20) 20 MEQ tablet TAKE 1 TABLET (20 MEQ TOTAL) BY MOUTH DAILY. PLEASE SCHEDULE APPOINTMENT FOR ADDITIONAL REFILLS. Patient taking differently: Take 20 mEq by mouth daily. 05/20/22  Yes Marinus Maw, MD  rivaroxaban (XARELTO) 20 MG TABS tablet Take 1 tablet (20 mg total) by mouth daily with supper. 05/08/22  Yes Crenshaw, Madolyn Frieze, MD  sacubitril-valsartan (ENTRESTO) 97-103 MG TAKE 1 TABLET BY MOUTH TWICE A DAY Patient taking differently: Take 1 tablet by mouth 2 (two) times daily. 05/21/22  Yes [name], MD  amiodarone (PACERONE) 200 MG tablet Take 1 tablet (200 mg total) by mouth 2 (two) times daily for 30 days, THEN 1 tablet (200 mg total) daily. Patient not taking: Reported on 06/03/2022 02/26/22 02/21/23  Marinus Maw, MD  nitroGLYCERIN (NITROSTAT) 0.4 MG SL tablet Place 1 tablet (0.4 mg total) under the tongue every 5 (five) minutes x 3 doses as needed for chest pain. Patient not taking: Reported on 06/03/2022 06/04/20 06/04/21  Beatrice Lecher, PA-C    Social History   Socioeconomic History   Marital status: Married    Spouse name: Not on file   Number of children: Not on file   Years of education: Not on file   Highest education level: Not on file  Occupational History   Occupation: Lawn care  Tobacco Use   Smoking status: Every Day    Types: Cigars   Smokeless tobacco: Never   Tobacco comments:    smokes cigars now, no longer smokes cigarettes but previously smoked 2ppd  Vaping Use   Vaping Use: Never used  Substance and Sexual Activity   Alcohol use: No   Drug use: No   Sexual activity: Not Currently    Partners: Female    Birth control/protection: None  Other Topics Concern   Not on file  Social History Narrative   Not on file   Social Determinants of Health   Financial  Resource Strain: Not on file  Food Insecurity: No Food Insecurity (06/03/2022)   Hunger Vital Sign    Worried About Running Out of Food in the Last Year: Never true    Ran Out of Food in the Last Year: Never true  Transportation Needs: No Transportation Needs (06/03/2022)   PRAPARE - Administrator, Civil Service (Medical): No    Lack of Transportation (Non-Medical): No  Physical Activity: Not on file  Stress: Not on file  Social Connections: Not on file  Intimate Partner Violence: Not At Risk (06/03/2022)   Humiliation, Afraid, Rape, and Kick questionnaire    Fear of Current or Ex-Partner: No    Emotionally Abused: No    Physically Abused: No    Sexually Abused:  No     Family History  Problem Relation Age of Onset   Cirrhosis Mother        due to ETOH   Cancer Neg Hx     Review of Systems  Constitutional: Negative.   HENT: Negative.    Eyes: Negative.   Respiratory:  Positive for shortness of breath.   Cardiovascular: Negative.   Gastrointestinal: Negative.   Genitourinary: Negative.   Musculoskeletal: Negative.   Skin: Negative.   Neurological:  Positive for weakness.  Endo/Heme/Allergies:  Bruises/bleeds easily.  Psychiatric/Behavioral: Negative.        Physical Examination  Vitals:   06/07/22 0728 06/07/22 0853  BP: 137/62   Pulse:    Resp:    Temp: 98.2 F (36.8 C)   SpO2:  95%   Body mass index is 27.85 kg/m.  Physical Exam HENT:     Head: Normocephalic.  Cardiovascular:     Rate and Rhythm: Normal rate.     Comments: Bilateral common femoral arteries are palpable No palpable popliteal pulse on the right or pedal pulses, 3+ palpable left popliteal pulse Abdominal:     Palpations: Abdomen is soft. There is no mass.  Musculoskeletal:        General: Normal range of motion.     Right lower leg: No edema.     Left lower leg: No edema.  Skin:    General: Skin is warm.     Capillary Refill: Capillary refill takes less than 2 seconds.   Neurological:     Mental Status: He is alert.      CBC    Component Value Date/Time   WBC 7.8 06/07/2022 0414   RBC 3.85 (L) 06/07/2022 0414   HGB 9.6 (L) 06/07/2022 0414   HGB 11.2 (L) 07/20/2021 0938   HCT 29.5 (L) 06/07/2022 0414   HCT 35.4 (L) 07/20/2021 0938   PLT 288 06/07/2022 0414   PLT 295 07/20/2021 0938   MCV 76.6 (L) 06/07/2022 0414   MCV 84 07/20/2021 0938   MCH 24.9 (L) 06/07/2022 0414   MCHC 32.5 06/07/2022 0414   RDW 19.2 (H) 06/07/2022 0414   RDW 16.6 (H) 07/20/2021 0938   LYMPHSABS 0.9 06/03/2022 0700   MONOABS 1.1 (H) 06/03/2022 0700   EOSABS 0.2 06/03/2022 0700   BASOSABS 0.0 06/03/2022 0700    BMET    Component Value Date/Time   NA 134 (L) 06/07/2022 0414   NA 138 04/15/2022 0913   K 3.7 06/07/2022 0414   CL 102 06/07/2022 0414   CO2 23 06/07/2022 0414   GLUCOSE 93 06/07/2022 0414   BUN <5 (L) 06/07/2022 0414   BUN 7 (L) 04/15/2022 0913   CREATININE 0.84 06/07/2022 0414   CREATININE 0.88 09/15/2015 0959   CALCIUM 8.1 (L) 06/07/2022 0414   GFRNONAA >60 06/07/2022 0414   GFRAA 96 03/17/2020 1016    COAGS: Lab Results  Component Value Date   INR 1.2 10/14/2021   INR 1.18 03/03/2017   INR 1.10 03/03/2017     Non-Invasive Vascular Imaging:   ULTRASOUND OF ABDOMINAL AORTA   TECHNIQUE: Ultrasound examination of the abdominal aorta and proximal common iliac arteries was performed to evaluate for aneurysm. Additional color and Doppler images of the distal aorta were obtained to document patency.   COMPARISON:  October 14, 2020.   FINDINGS: Abdominal aortic measurements as follows:   Proximal:  2.3 cm   Mid:  2.4 cm   Distal:  6.1 cm Patent: Yes, peak systolic  velocity is 61 cm/s   Status post endograft placement.   Right common iliac artery: 1.4 cm   Left common iliac artery: 1.5 cm   IMPRESSION: Status post abdominal aortic endograft placement. Distal portion of occluded aneurysmal sac measures 6.1 cm. Endoleak  cannot be excluded on the basis of this exam. If there is clinical concern for endoleak, CT angiography is recommended for further evaluation.    ASSESSMENT/PLAN: This is a 77 y.o. male with history of EVAR now with some concern for enlarging aneurysm sac by ultrasound with possibility endoleak.  On physical exam there is no pulsatility in the aortic aneurysm palpation to suggest any concerning features of the aneurysm sac at this time.  Plan will be for outpatient CT scan in 4 to 6 weeks and will also obtain bilateral lower extremity runoff given large left popliteal pulse which 6 years ago was noted to not be aneurysmal.  I discussed the plan with the patient and his wife at bedside they demonstrate adequate understanding.  Benjamin Cook C. Randie Heinz, MD [facility] Office: [phone] Pager: [phone]

## 2022-06-07 NOTE — Assessment & Plan Note (Addendum)
Left arm with red streaking from infiltrated IV.  Still quite tender and warm today. - Start cefadroxil

## 2022-06-07 NOTE — Progress Notes (Signed)
  Progress Note   Patient: Benjamin Cook ZOX:[id number] DOB: December 15, 1945 DOA: 06/02/2022     4 DOS: the patient was seen and examined on 06/07/2022        Brief hospital course: Mr. Booras is a 77 y.o. M with hx Afib on Xarelto, VT s/p ablation and ICD placement, HTN, COPD, sCHF E20-25%, and known adrenal mass who presented with chest pain and near syncope found to have symptomatic VT and a new lung mass.     Assessment and Plan: * Ventricular tachyarrhythmia ([name]) - Transition to PO amiodarone - Continue metoprolol - Consult EP, appreciate expertise    Mass of right lung Previously noted lung nodule, now doubled in size to 4cm.  Pulmonary consulted, recommended EBUS.  Was to have EBUS 5/3 but inadvertently given breakfast, procedure canceled.  Rescheduled as an outpatient. - EBUS on 5/16 - Oncology will arrange for outpatient PET/CT and follow up appointment       Essential hypertension BP near controlled - Continue Imdur, metoprolol, Entresto - Start spironolactone  ICD (implantable cardioverter-defibrillator) in place    Chronic systolic CHF (congestive heart failure) ([name]) Ischemic cardiomyopathy.  EF 20-25% this hospitalization.  Appears euvolemic - Continue Imdur, metoprolol, Entresto - Start spironolactone    Lymphangitis Left arm with red streaking from infiltrated IV.  Still quite tender and warm today. - Start cefadroxil  Anemia, iron deficiency Hgb stable 9.7 g/dL.  Administered IV iron.  AAA (abdominal aortic aneurysm) ([name]) S/P endograft in 2019 with Dr. Randie Heinz.  No follow up since 2019 with Vascular.  Reports vague lower abdominal pain here.  No imaging in our system since 2022.  Discussed with [facility]VS, obtained US AAA that showed up to 6.1 - Vascuar surgery follow up at d/c - VVS to obtain CTA outpateint  Paroxysmal atrial fibrillation ([name]) - Resume Xarelto - Continue amiodarone, back to oral - Continue metoprolol  COPD, severe ([name]) No  flare - Continue home Dulera  HLD (hyperlipidemia) - Continue atorvastatin          Subjective: No change, no nursing concerns.     Physical Exam: BP 128/60 (BP Location: Right Arm)   Pulse 71   Temp 98.2 F (36.8 C) (Oral)   Resp 17   Ht 5\' 11"  (1.803 m)   Wt 90.6 kg   SpO2 95%   BMI 27.85 kg/m   Frail elderly adult male, sitting in the edge of the bed, interactive and appropriate RRR with occasional premature beats, I do not appreciate murmurs, no peripheral edema Respiratory rate seems normal, lungs clear without rales or wheezes Abdomen soft, vague abdominal discomfort, no focal tenderness to palpation, no rigidity or rebound Attention normal, affect blunted by age and fatigue, oriented to person, place, time, generalized weakness The left arm has some edematous area around prior IV, seems warm to me, slightly reddish  Data Reviewed: BMP normal Hgb 9.6 stable US aorta shows 6.1cm aneurysm   Family Communication: Wife at bedside, daughter by phone    Disposition: Status is: Inpatient EP recommend monitoring overnight on amiodarone, if stable, d/c tomorrow         Author: Alberteen Sam, MD 06/07/2022 4:13 PM  For on call review [email or web address].

## 2022-06-07 NOTE — Progress Notes (Signed)
Brief PCCM Progress Note  Reportedly got a breakfast tray and had some eggs, potatoes. EBUS was scheduled for 12 but as he was already high risk (albeit for low risk procedure) I think we won't be able to do it today.   - ok to continue regular diet - ok to resume [facility]  - Unfortunately no room in OR or endo to schedule this case this coming Monday or Tuesday. We will schedule his EBUS as an outpatient tentatively 5/16. He will need to hold DOAC for 3 days before procedure, aspirin for 5 days before procedure.   Laroy Apple Pulmonary/Critical Care

## 2022-06-07 NOTE — Progress Notes (Addendum)
Patient Name: Benjamin Cook Date of Encounter: 06/07/2022  Primary Cardiologist: Olga Millers, MD Electrophysiologist: [name], MD  Interval Summary   "I'm fine", denies CP, palpitations, SOB  Inpatient Medications    Scheduled Meds:  atorvastatin  80 mg Oral q1800   isosorbide mononitrate  15 mg Oral Daily   metoprolol succinate  50 mg Oral BID   mometasone-formoterol  2 puff Inhalation BID   sacubitril-valsartan  1 tablet Oral BID   Continuous Infusions:  amiodarone 30 mg/hr (06/07/22 0740)   PRN Meds: acetaminophen, ipratropium-albuterol, nitroGLYCERIN, ondansetron (ZOFRAN) IV   Vital Signs    Vitals:   06/06/22 2017 06/07/22 0617 06/07/22 0728 06/07/22 0853  BP: (!) 141/66 (!) 122/54 137/62   Pulse: 75 61    Resp: 18 17    Temp: 98.3 F (36.8 C) 98.8 F (37.1 C) 98.2 F (36.8 C)   TempSrc: Oral Oral Oral   SpO2: 94% 96%  95%  Weight:      Height:        Intake/Output Summary (Last 24 hours) at 06/07/2022 1001 Last data filed at 06/07/2022 0500 Gross per 24 hour  Intake 390.49 ml  Output 340 ml  Net 50.49 ml   Filed Weights   06/02/22 2200 06/06/22 0045  Weight: 65.8 kg 90.6 kg    Physical Exam    Unchanged exam GEN- The patient is well appearing, alert and oriented x 3 today.   Lungs- CTA b/l, normal work of breathing Cardiac- RRR, no murmurs, rubs or gallops GI- soft, NT, ND Extremities- no clubbing or cyanosis. No edema  Telemetry    NSR 60-80s occ PVCs/couplets, No further frequent NSVT (personally reviewed)  CARDIAC DATA  06/03/22: TTE 1. Global hypokinesis with akinesis of the septum and aneurysm formation  of apex;overall severe LV dysfunction.   2. Left ventricular ejection fraction, by estimation, is 20 to 25%. The  left ventricle has severely decreased function. The left ventricle  demonstrates regional wall motion abnormalities (see scoring  diagram/findings for description). The left  ventricular internal cavity size  was mildly dilated. Left ventricular  diastolic parameters are consistent with Grade I diastolic dysfunction  (impaired relaxation).   3. Right ventricular systolic function is normal. The right ventricular  size is normal.   4. The mitral valve is normal in structure. No evidence of mitral valve  regurgitation. No evidence of mitral stenosis.   5. The aortic valve is tricuspid. Aortic valve regurgitation is not  visualized. Aortic valve sclerosis is present, with no evidence of aortic  valve stenosis.   6. The inferior vena cava is normal in size with greater than 50%  respiratory variability, suggesting right atrial pressure of 3 mmHg.   Hospital Course    Benjamin Cook is a 77 y.o. male with a history of PAF, VT s/p ICD placement, VT ablation 10/2020, Chronic systolic CHF, Anemia, lung nodule who is being seen today for the evaluation of VT at the request of Dr. Mayford Knife.   Device information [employer] dual chamber ICD implanted 07/18/2014 RA lead is from 05/25/2010 He has an abandoned RV pacing lead Known hx of VT (2021, April and September 2022)  Assessment & Plan    Recurrent VT S/p ablation 10/2020 Had previously been quiescent s/p ablation apart from episode 01/2022  Amiodarone previously stopped 04/2022 for ESR+. No clear symptoms were documented.  Ectopy and NSVT well suppressed on toprol and amio PVC/couplets with waxing/waning frequency.   Transition to PO amiodarone today  If no VT on PO could discharge from EP perspective tomorrow   Chronic systolic CHF Echo 4/29 LVEF 20-25%, global HK with akinesis of the septum and aneurysm formation of apex, normal RV, no significant valvular disease.  HS trop flat and near baseline Continue statin Continue GDMT Agree with addition of spironolactone Appears volume stable   CAD h/o LAD stent HS trop negative as above.  Mod to severe non-obstructive disease 10/2020 CP not typical of angina   HTN Follow with HF med adjustment.     Afib Burden low chronically Maintaining SR here IM will resume Xarelto today   Lung mass Highly concerning for neoplasm given size change from 10/2021 Unfortunately he apparently had some food and is cancelled, perhaps for out patient w/u    Dr. Nelly Laurence has seen the patient Anticipate discharge tomorrow if rhythm stable, we will sign off though remain available,  please call if needed over the weekend/or back to case   For questions or updates, please contact [facility] Please consult [email or web address] for contact info under Cardiology/STEMI.  Signed, Sheilah Pigeon, PA-C  06/07/2022, 10:01 AM

## 2022-06-08 DIAGNOSIS — I472 Ventricular tachycardia, unspecified: Secondary | ICD-10-CM | POA: Diagnosis not present

## 2022-06-08 LAB — BASIC METABOLIC PANEL
Anion gap: 7 (ref 5–15)
BUN: 5 mg/dL — ABNORMAL LOW (ref 8–23)
CO2: 23 mmol/L (ref 22–32)
Calcium: 8 mg/dL — ABNORMAL LOW (ref 8.9–10.3)
Chloride: 104 mmol/L (ref 98–111)
Creatinine, Ser: 0.9 mg/dL (ref 0.61–1.24)
GFR, Estimated: >60 mL/min (ref >60)
Glucose, Bld: 99 mg/dL (ref 70–99)
Potassium: 3.6 mmol/L (ref 3.5–5.1)
Sodium: 134 mmol/L — ABNORMAL LOW (ref 135–145)

## 2022-06-08 LAB — CBC
HCT: 28 % — ABNORMAL LOW (ref 39.0–52.0)
Hemoglobin: 9.1 g/dL — ABNORMAL LOW (ref 13.0–17.0)
MCH: 24.7 pg — ABNORMAL LOW (ref 26.0–34.0)
MCHC: 32.5 g/dL (ref 30.0–36.0)
MCV: 75.9 fL — ABNORMAL LOW (ref 80.0–100.0)
Platelets: 251 10*3/uL (ref 150–400)
RBC: 3.69 MIL/uL — ABNORMAL LOW (ref 4.22–5.81)
RDW: 19.3 % — ABNORMAL HIGH (ref 11.5–15.5)
WBC: 8 10*3/uL (ref 4.0–10.5)
nRBC: 0 % (ref 0.0–0.2)

## 2022-06-08 LAB — MAGNESIUM: Magnesium: 1.7 mg/dL (ref 1.7–2.4)

## 2022-06-08 MED ORDER — CEFADROXIL 500 MG PO CAPS: 500.0000 mg | ORAL_CAPSULE | Freq: Two times a day (BID) | ORAL | 0 refills | Status: AC

## 2022-06-08 MED ORDER — SPIRONOLACTONE 25 MG PO TABS: 25.0000 mg | ORAL_TABLET | Freq: Every day | ORAL | 3 refills | Status: DC

## 2022-06-08 MED ORDER — METOPROLOL SUCCINATE ER 50 MG PO TB24: 50.0000 mg | ORAL_TABLET | Freq: Two times a day (BID) | ORAL | 3 refills | Status: DC

## 2022-06-08 MED ORDER — AMIODARONE HCL 400 MG PO TABS: 200.0000 mg | ORAL_TABLET | Freq: Two times a day (BID) | ORAL | 0 refills | Status: DC

## 2022-06-08 NOTE — Progress Notes (Signed)
   Rounding Note    Patient Name: Benjamin Cook Date of Encounter: 06/08/2022  [facility] Cardiologist: Olga Millers, MD   Subjective   NAEO.   Vital Signs    Vitals:   06/07/22 2046 06/07/22 2122 06/08/22 0433 06/08/22 0854  BP:  120/81 126/62   Pulse:  66 60   Resp:  (!) 25 15   Temp:  99 F (37.2 C) 98.5 F (36.9 C)   TempSrc:  Oral Oral   SpO2: 96% 96% 96% 95%  Weight:      Height:        Intake/Output Summary (Last 24 hours) at 06/08/2022 0950 Last data filed at 06/08/2022 0429 Gross per 24 hour  Intake 161.83 ml  Output 400 ml  Net -238.17 ml      06/06/2022   12:45 AM 06/02/2022   10:00 PM 02/08/2022   10:36 AM  Last 3 Weights  Weight (lbs) 199 lb 11.2 oz 145 lb 147 lb  Weight (kg) 90.583 kg 65.772 kg 66.679 kg      Telemetry    PVCs and short NSVT. No sustained VT. - Personally Reviewed  ECG    Personally Reviewed  Physical Exam    GEN: No acute distress.   Cardiac: RRR, no murmurs, rubs, or gallops.  Respiratory: Clear to auscultation bilaterally. Psych: Normal affect   Assessment & Plan    #VT Quiescent on amiodarone and metroprolol. Continue amiodarone 200mg  PO BID until follow up  #Chronic systolic HF Continue GDMT  #CAD No ischemic symptoms today.  OK to discharge.    Sheria Lang T. Lalla Brothers, MD, [facility], [facility] Cardiac Electrophysiology

## 2022-06-08 NOTE — Discharge Summary (Signed)
Physician Discharge Summary   Patient: Benjamin Cook MRN: [id number] DOB: [birth year]  Admit date:     06/02/2022  Discharge date: 06/08/22  Discharge Physician: Alberteen Sam   PCP: [name], MD     Recommendations at discharge:  Follow up with Pulmonology Dr. Thora Lance for EBUS and biopsy on 5/16 Follow up with Oncology Dr. Bertis Ruddy for PET/CT and biopsy follow up as directed Follow up with EP Dr. Nelly Laurence in 10 days for ventricular tachycardia Follow up with Dr. Randie Heinz for CT angiogram abdomen and AAA Please obtain a BMPin 1 week on new spironolactone     Discharge Diagnoses: Principal Problem:   Ventricular tachyarrhythmia ([name]) Active Problems:   Mass of right lung   Chronic systolic CHF (congestive heart failure) ([name])   ICD (implantable cardioverter-defibrillator) in place   Essential hypertension   HLD (hyperlipidemia)   COPD, severe ([name])   Paroxysmal atrial fibrillation ([name])   AAA (abdominal aortic aneurysm) ([name])   Anemia, iron deficiency   Lymphangitis      Hospital Course: Benjamin Cook is a 77 y.o. M with hx Afib on Xarelto, VT s/p ablation and ICD placement, HTN, COPD, sCHF E20-25%, and known adrenal mass who presented with chest pain and near syncope found to have symptomatic VT and a new lung mass.       * Ventricular tachyarrhythmia ([name]) Essential hypertension ICD (implantable cardioverter-defibrillator) in place Chronic systolic CHF (congestive heart failure) ([name]) Admitted, EP consulted, started on amiodarone infusion.  EP recommended change carvedilol to metoprolol, amiodarone to oral, and follow-up in 2 weeks.     Mass of right lung Previously noted lung nodule, now doubled in size to 4cm.  Pulmonary consulted, recommended EBUS.   - EBUS on 5/16 - Oncology will arrange for outpatient PET/CT and follow up appointment    Ischemic cardiomyopathy.  EF 20-25% this hospitalization.  Appears euvolemic - Continue Imdur,  metoprolol, Entresto - Start spironolactone    Lymphangitis Left arm with red streaking from infiltrated IV.  Tender and warm.  Discharged on short course antibiotics.  Anemia, iron deficiency Hgb stable 9.7 g/dL.  Administered IV iron.  AAA (abdominal aortic aneurysm) ([name]) S/P endograft in 2019 with Dr. Randie Heinz.  No follow up since 2019 with Vascular.  Obtained US AAA that showed up to 6.1, possible endoleak.  Vascular surgery saw and arranged outpatient follow up.     Paroxysmal atrial fibrillation ([name]) On amiodarone, metoprolol, Xarelto.  COPD, severe ([name]) No flare  HLD (hyperlipidemia)            The [facility] Controlled Substances Registry was reviewed for this patient prior to discharge.  Consultants:  Electrophysiology  Procedures performed:  Echo Device check   Disposition: Home health Diet recommendation:  Cardiac diet  DISCHARGE MEDICATION: Allergies as of 06/08/2022   No Known Allergies      Medication List     STOP taking these medications    carvedilol 6.25 MG tablet Commonly known as: COREG       TAKE these medications    acetaminophen 500 MG tablet Commonly known as: TYLENOL Take 500-1,000 mg by mouth every 8 (eight) hours as needed (for pain).   amiodarone 400 MG tablet Commonly known as: PACERONE Take 0.5 tablets (200 mg total) by mouth 2 (two) times daily.   atorvastatin 80 MG tablet Commonly known as: LIPITOR TAKE 1 TABLET BY MOUTH DAILY AT 6 PM What changed:  when to take this additional instructions  cefadroxil 500 MG capsule Commonly known as: DURICEF Take 1 capsule (500 mg total) by mouth 2 (two) times daily for 5 days.   Dulera 100-5 MCG/ACT Aero Generic drug: mometasone-formoterol Inhale 2 puffs into the lungs in the morning and at bedtime.   Entresto 97-103 MG Generic drug: sacubitril-valsartan TAKE 1 TABLET BY MOUTH TWICE A DAY   isosorbide mononitrate 30 MG 24 hr tablet Commonly known as:  IMDUR TAKE 1/2 OF A TABLET (15 MG TOTAL) BY MOUTH DAILY What changed: See the new instructions.   Klor-Con M20 20 MEQ tablet Generic drug: potassium chloride SA TAKE 1 TABLET (20 MEQ TOTAL) BY MOUTH DAILY. PLEASE SCHEDULE APPOINTMENT FOR ADDITIONAL REFILLS. What changed: See the new instructions.   melatonin 5 MG Tabs Take 1 tablet (5 mg total) by mouth at bedtime.   metoprolol succinate 50 MG 24 hr tablet Commonly known as: TOPROL-XL Take 1 tablet (50 mg total) by mouth 2 (two) times daily. Take with or immediately following a meal.   multivitamin capsule Take 1 capsule by mouth daily.   rivaroxaban 20 MG Tabs tablet Commonly known as: XARELTO Take 1 tablet (20 mg total) by mouth daily with supper.   spironolactone 25 MG tablet Commonly known as: ALDACTONE Take 1 tablet (25 mg total) by mouth daily.        Follow-up Information     Artis Delay, MD Follow up.   Specialty: Hematology and Oncology Contact information: [address] [place] [address] [phone]         Omar Person, MD .   Specialty: Pulmonary Disease Contact information: [address] [place] [address] [phone]         Marinus Maw, MD .   Specialty: Cardiology Contact information: [phone] [address] [place] [address] [phone]         Maeola Harman, MD .   Specialties: Vascular Surgery, Cardiology Contact information: [address] [place] [address] [phone]                 Discharge Instructions     Discharge instructions   Complete by: As directed    **IMPORTANT DISCHARGE INSTRUCTIONS**   From Dr. Maryfrances Bunnell: You were admitted for episodes of dizziness and almost passing out.  Here, we found that this was from abnormal heart rhythms again.  We did perform a CT image of the chest, an echocardiogram (ultrasound of heart) and blood tests to rule out infection or organ failure and found  that these were all normal.  The EP doctors (heart electrical specialists like Dr. Ladona Ridgel) adjusted your medicines to suppress these abnormal heart rhythms, and it seems to be working  CHANGE carvedilol to metoprolol Metoprolol is a cousin of carvedilol, use it instead  RESUME amiodarone This is a heart rhythm medicine Take it 200 mg twice daily for the next two weeks Dr. Nelly Laurence will see you on May 15 (see below) and you will need to GET MORE of the medicine from him, he may adjust the dose    In addition, we found the lung mass.   For the lung mass, the next steps are:  With the Oncologist: 1. Get a PET scan with Dr. Bertis Ruddy 2. See Dr. Bertis Ruddy in the office  (Your May 10th appointment with Dr. Bertis Ruddy may get changed, call her office to confirm)   With the pulmonologist Dr. Thora Lance: 1. Get a biopsy  You should have the biopsy on May 16  If they have not called you by the end of this week, call Dr. Paulina Fusi office at the number below When your biopsy is coming up, you will have to STOP XARELTO THREE DAYS BEFORE   Lastly, we did find that your aneurysm in your belly has gotten bigger Go see Dr. Randie Heinz about this and get imaging.   You don't have to decide about surgery or procedures to do anything, but you should get the imaging.   Also, you may have a small "phlebitis" in your left arm from the IV.  Phlebitis is sometimes from infection, and so I recommend a few more days antibiotics with cefadroxil 500 mg twice daily   Increase activity slowly   Complete by: As directed        Discharge Exam: Filed Weights   06/02/22 2200 06/06/22 0045  Weight: 65.8 kg 90.6 kg    General: Pt is alert, awake, not in acute distress Cardiovascular: RRR, nl S1-S2, no murmurs appreciated.   No LE edema.   Respiratory: Normal respiratory rate and rhythm.  CTAB without rales or wheezes. Abdominal: Abdomen soft and non-tender.  No distension or HSM.   Neuro/Psych: Strength symmetric in upper and  lower extremities.  Judgment and insight appear normal.   Condition at discharge: stable  The results of significant diagnostics from this hospitalization (including imaging, microbiology, ancillary and laboratory) are listed below for reference.   Imaging Studies: US AORTA DUPLEX LIMITED  Result Date: 06/06/2022 CLINICAL DATA:  Abdominal aortic aneurysm. EXAM: ULTRASOUND OF ABDOMINAL AORTA TECHNIQUE: Ultrasound examination of the abdominal aorta and proximal common iliac arteries was performed to evaluate for aneurysm. Additional color and Doppler images of the distal aorta were obtained to document patency. COMPARISON:  October 14, 2020. FINDINGS: Abdominal aortic measurements as follows: Proximal:  2.3 cm Mid:  2.4 cm Distal:  6.1 cm Patent: Yes, peak systolic velocity is 61 cm/s Status post endograft placement. Right common iliac artery: 1.4 cm Left common iliac artery: 1.5 cm IMPRESSION: Status post abdominal aortic endograft placement. Distal portion of occluded aneurysmal sac measures 6.1 cm. Endoleak cannot be excluded on the basis of this exam. If there is clinical concern for endoleak, CT angiography is recommended for further evaluation. Electronically Signed   By: Lupita Raider M.D.   On: 06/06/2022 15:02   CT ANGIO HEAD NECK W WO CM  Result Date: 06/05/2022 CLINICAL DATA:  Lung cancer staging EXAM: CT ANGIOGRAPHY HEAD AND NECK WITH AND WITHOUT CONTRAST TECHNIQUE: Multidetector CT imaging of the head and neck was performed using the standard protocol during bolus administration of intravenous contrast. Multiplanar CT image reconstructions and MIPs were obtained to evaluate the vascular anatomy. Carotid stenosis measurements (when applicable) are obtained utilizing NASCET criteria, using the distal internal carotid diameter as the denominator. RADIATION DOSE REDUCTION: This exam was performed according to the departmental dose-optimization program which includes automated exposure control,  adjustment of the mA and/or kV according to patient size and/or use of iterative reconstruction technique. CONTRAST:  75mL OMNIPAQUE IOHEXOL 350 MG/ML SOLN COMPARISON:  CT CHest 06/03/22, CTA chest 10/13/21, CT head 03/30/21, CTA head/neck 03/30/21 FINDINGS: CT HEAD FINDINGS Brain: Unchanged hyperdensities in the medial aspect of bilateral occipital lobes. No CT evidence of an acute cortical infarct. There is redemonstration of slight prominence of the extra-axial space along the right frontal lobe, which could represent a small chronic subdural hematoma or a central hygroma. No hemorrhage. Vascular: See below Skull: Normal. Negative for fracture or focal lesion.  Sinuses/Orbits: No middle ear mastoid effusion. Paranasal sinuses noted focal 4 trace mucosal thickening in the posterior ethmoid air cells on the right. Bilateral lens replacement orbits are otherwise unremarkable. Other: None Review of the MIP images confirms the above findings CTA NECK FINDINGS Aortic arch: Standard branching. Imaged portion shows no evidence of aneurysm or dissection. No significant stenosis of the major arch vessel origins. Mild atherosclerotic plaque in the proximal aspect of the left subclavian artery. Right carotid system: No evidence of dissection, stenosis (50% or greater), or occlusion. Left carotid system: No evidence of dissection, stenosis (50% or greater), or occlusion. Vertebral arteries: Is likely moderate to severe multifocal stenosis in the V1 segment of the right vertebral artery. Origin of the left vertebral artery is not assessed due to a combination of streak and motion artifact. There is calcified atherosclerotic plaque in the bilateral vertebral arteries. Skeleton: Unchanged sclerotic appearance of the vertebral bodies in the lower cervical spinal C5-C7. Similar findings are seen at the C2 level and the posterior elements of C3 on C4. Other neck: Patient is edentulous. Upper chest: Severe centrilobular emphysema. See  prior CT chest dated 06/03/2022 for additional intrathoracic findings. Review of the MIP images confirms the above findings CTA HEAD FINDINGS Anterior circulation: No significant proximal occlusion, aneurysm, or vascular malformation. There is mild-to-moderate narrowing of the bifurcation of the right MCA. Posterior circulation: No significant stenosis, proximal occlusion, aneurysm, or vascular malformation. Venous sinuses: As permitted by contrast timing, patent. Anatomic variants: Review of the MIP images confirms the above findings IMPRESSION: 1. Combination of CT technique and arterial timing of the contras bolus significantly limits and essentially precludes assessment for intracranial metastatic disease. Within this limitation, no contrast enhancing lesion is visualized. 2. No CT evidence of an acute cortical infarct. 3. No significant proximal occlusion, aneurysm, or vascular malformation in the intracranial circulation. Mild-to-moderate narrowing of the bifurcation of the right MCA. 4. Likely moderate to severe multifocal stenosis in the V1 segment of the right vertebral artery. Origin of the left vertebral artery is not assessed due to a combination of streak and motion artifact. 5. Severe centrilobular emphysema. See prior CT chest dated 06/03/2022 for additional intrathoracic findings. 6. Unchanged appearance of the cervical spine vertebral bodies compared to 03/30/21. Emphysema (ICD10-J43.9). Electronically Signed   By: Lorenza Cambridge M.D.   On: 06/05/2022 15:33   CUP PACEART REMOTE DEVICE CHECK  Result Date: 06/03/2022 Bill[employer] remote reviewed. Normal device function.  6 AMS for <1%, <1 min.  Intermittent RA lead noise. 1 RA noise reversion due to RA lead noise 57 SVT classified event, of those listed, longest 1 min 48 sec with V-rates 120's.  Few EGMS. Frequent bigeminy PVC couplets. 32 NSVT clasified events, longest 26 sec, V rates 130s.  One EGMs to view with frequent PVC couplets  and triplets. 3 VT-1 events, all on 4/28.  Longset 3 min 2 sec with V-rates 120s.  Total of 28 sequence of ATP delivered.  No EGMs to view.  To triage. Next remote 91 days. [place], CV[facility].  ECHOCARDIOGRAM COMPLETE  Result Date: 06/03/2022    ECHOCARDIOGRAM REPORT   Patient Name:   LYNNOX BOYKO Date of Exam: 06/03/2022 Medical Rec #:  [id number]         Height:       71.0 in Accession #:    [id number]        Weight:       145.0 lb Date of Birth:  [birth year]  BSA:          1.839 m Patient Age:    76 years          BP:           125/57 mmHg Patient Gender: M                 HR:           84 bpm. Exam Location:  Inpatient Procedure: 2D Echo, Cardiac Doppler, Color Doppler and Intracardiac            Opacification Agent Indications:    Ventricular Tachycardia I47.2  History:        Patient has prior history of Echocardiogram examinations, most                 recent 10/11/2020. Cardiomyopathy and CHF, CAD, COPD,                 Arrythmias:Tachycardia and Atrial Fibrillation,                 Signs/Symptoms:Dyspnea and Shortness of Breath; Risk                 Factors:Hypertension, Dyslipidemia and Current Smoker.  Sonographer:    [name] Referring Phys: [id number] MICHAEL ANDREW TILLERY IMPRESSIONS  1. Global hypokinesis with akinesis of the septum and aneurysm formation of apex;overall severe LV dysfunction.  2. Left ventricular ejection fraction, by estimation, is 20 to 25%. The left ventricle has severely decreased function. The left ventricle demonstrates regional wall motion abnormalities (see scoring diagram/findings for description). The left ventricular internal cavity size was mildly dilated. Left ventricular diastolic parameters are consistent with Grade I diastolic dysfunction (impaired relaxation).  3. Right ventricular systolic function is normal. The right ventricular size is normal.  4. The mitral valve is normal in structure. No evidence of mitral valve regurgitation. No evidence of mitral  stenosis.  5. The aortic valve is tricuspid. Aortic valve regurgitation is not visualized. Aortic valve sclerosis is present, with no evidence of aortic valve stenosis.  6. The inferior vena cava is normal in size with greater than 50% respiratory variability, suggesting right atrial pressure of 3 mmHg. FINDINGS  Left Ventricle: Left ventricular ejection fraction, by estimation, is 20 to 25%. The left ventricle has severely decreased function. The left ventricle demonstrates regional wall motion abnormalities. Definity contrast agent was given IV to delineate the left ventricular endocardial borders. The left ventricular internal cavity size was mildly dilated. There is no left ventricular hypertrophy. Left ventricular diastolic parameters are consistent with Grade I diastolic dysfunction (impaired relaxation). Right Ventricle: The right ventricular size is normal. Right ventricular systolic function is normal. Left Atrium: Left atrial size was normal in size. Right Atrium: Right atrial size was normal in size. Pericardium: Trivial pericardial effusion is present. Mitral Valve: The mitral valve is normal in structure. Mild mitral annular calcification. No evidence of mitral valve regurgitation. No evidence of mitral valve stenosis. Tricuspid Valve: The tricuspid valve is normal in structure. Tricuspid valve regurgitation is trivial. No evidence of tricuspid stenosis. Aortic Valve: The aortic valve is tricuspid. Aortic valve regurgitation is not visualized. Aortic valve sclerosis is present, with no evidence of aortic valve stenosis. Aortic valve peak gradient measures 4.1 mmHg. Pulmonic Valve: The pulmonic valve was normal in structure. Pulmonic valve regurgitation is not visualized. No evidence of pulmonic stenosis. Aorta: The aortic root is normal in size and structure. Venous: The inferior vena cava is normal in size  with greater than 50% respiratory variability, suggesting right atrial pressure of 3 mmHg.  IAS/Shunts: No atrial level shunt detected by color flow Doppler. Additional Comments: Global hypokinesis with akinesis of the septum and aneurysm formation of apex;overall severe LV dysfunction. A device lead is visualized.  LEFT VENTRICLE PLAX 2D LVIDd:         5.90 cm      Diastology LVIDs:         4.80 cm      LV e' medial:   5.44 cm/s LV PW:         1.00 cm      LV E/e' medial: 8.1 LV IVS:        0.80 cm LVOT diam:     1.80 cm LV SV:         39 LV SV Index:   21 LVOT Area:     2.54 cm  LV Volumes (MOD) LV vol d, MOD A2C: 153.5 ml LV vol d, MOD A4C: 207.0 ml LV vol s, MOD A2C: 109.8 ml LV vol s, MOD A4C: 139.0 ml LV SV MOD A2C:     43.8 ml LV SV MOD A4C:     207.0 ml LV SV MOD BP:      53.6 ml RIGHT VENTRICLE             IVC RV Basal diam:  3.10 cm     IVC diam: 0.90 cm RV S prime:     11.20 cm/s TAPSE (M-mode): 1.3 cm LEFT ATRIUM             Index        RIGHT ATRIUM           Index LA diam:        3.75 cm 2.04 cm/m   RA Area:     14.20 cm LA Vol (A2C):   34.7 ml 18.87 ml/m  RA Volume:   38.00 ml  20.66 ml/m LA Vol (A4C):   29.4 ml 15.99 ml/m LA Biplane Vol: 34.2 ml 18.59 ml/m  AORTIC VALVE AV Area (Vmax): 2.08 cm AV Vmax:        101.00 cm/s AV Peak Grad:   4.1 mmHg LVOT Vmax:      82.43 cm/s LVOT Vmean:     50.800 cm/s LVOT VTI:       0.153 m  AORTA Ao Root diam: 2.90 cm Ao Asc diam:  3.00 cm MITRAL VALVE               TRICUSPID VALVE MV Area (PHT): 4.39 cm    TR Peak grad:   14.9 mmHg MV Decel Time: 173 msec    TR Vmax:        193.00 cm/s MV E velocity: 44.10 cm/s MV A velocity: 86.30 cm/s  SHUNTS MV E/A ratio:  0.51        Systemic VTI:  0.15 m                            Systemic Diam: 1.80 cm Olga Millers MD Electronically signed by Olga Millers MD Signature Date/Time: 06/03/2022/2:58:23 PM    Final    CT Chest W Contrast  Result Date: 06/03/2022 CLINICAL DATA:  Pleural effusion, malignancy suspected. EXAM: CT CHEST WITH CONTRAST TECHNIQUE: Multidetector CT imaging of the chest was performed  during intravenous contrast administration. RADIATION DOSE REDUCTION: This exam was performed according to the departmental dose-optimization program which  includes automated exposure control, adjustment of the mA and/or kV according to patient size and/or use of iterative reconstruction technique. CONTRAST:  50mL OMNIPAQUE IOHEXOL 350 MG/ML SOLN COMPARISON:  10/13/2021. FINDINGS: Cardiovascular: The heart is enlarged and there is a small pericardial effusion. Pacemaker leads are noted in the heart. Three-vessel coronary artery calcifications are noted. There is atherosclerotic calcification of the aorta without evidence of aneurysm. The pulmonary trunk is normal in caliber. Mediastinum/Nodes: Prominent lymph node is present in the subcarinal space measuring 1 cm. No hilar or axillary lymphadenopathy is seen. The thyroid gland, trachea, and esophagus are within normal limits. There is a small hiatal hernia. Lungs/Pleura: Paraseptal and centrilobular emphysematous changes are present in the lungs. Debris is present in the left mainstem bronchus. There is bronchial wall thickening bilaterally with multifocal mucous plugging in the bilateral lower lobes, greater on the left than on the right. Atelectasis is noted at the lung bases. There are trace bilateral pleural effusions. Trace bilateral pleural effusions are noted. No pneumothorax. There is redemonstration of a mass in the suprahilar region on the right measuring 4.1 x 4.1 cm, increased in size from the prior exam. Pleural and parenchymal scarring is noted bilaterally. Upper Abdomen: There is a 4.1 cm mass in the right adrenal gland containing calcifications, unchanged from 2009. Cysts are present in the right kidney. Subcentimeter hypodensities are present in the left kidney which are too small to further characterize. An aortic endograft is noted in the upper abdomen. No acute abnormality. Musculoskeletal: A pacemaker device is present in the anterior chest wall  on the left. Degenerative changes are present in the thoracic spine. No acute or suspicious osseous abnormality. IMPRESSION: 1. Interval enlargement of suprahilar mass on the right now measuring 4.1 x 4.1 cm, suspicious for neoplasm. PET-CT or biopsy is recommended for further evaluation. 2. Bilateral bronchial wall thickening with multifocal mucous plugging in the lower lobes bilaterally, greater on the left than on the right. 3. Emphysema. 4. Aortic atherosclerosis and coronary artery calcifications. Electronically Signed   By: Thornell Sartorius M.D.   On: 06/03/2022 01:28   DG Chest 2 View  Result Date: 06/02/2022 CLINICAL DATA:  Chest pain EXAM: CHEST - 2 VIEW COMPARISON:  01/22/2022 FINDINGS: Cardiac shadow is within normal limits. Defibrillator is again noted. Aortic calcifications are seen. Mild scarring is noted in the right apex stable from the prior exam. Fullness in the right hilar region is noted which corresponds to a soft tissue density seen on prior CT examination from 10/13/2021. This may represent enlarging right hilar mass. No bony abnormality is noted. No other focal abnormality is seen. IMPRESSION: Prominent right hilum with focal mass lesion on the lateral projection similar to that seen on prior CT examination. Again this is suspicious for neoplasm. Follow-up CT is recommended for further evaluation. Electronically Signed   By: Alcide Clever M.D.   On: 06/02/2022 23:32    Microbiology: Results for orders placed or performed during the hospital encounter of 06/02/22  SARS Coronavirus 2 by RT PCR (hospital order, performed in [facility] hospital lab) *cepheid single result test* Anterior Nasal Swab     Status: None   Collection Time: 06/06/22  5:47 PM   Specimen: Anterior Nasal Swab  Result Value Ref Range Status   SARS Coronavirus 2 by RT PCR NEGATIVE NEGATIVE Final    Comment: Performed at [facility], [address], [place], [place] [address]    Labs: CBC: Recent Labs   Lab 06/02/22  2306 06/03/22 0700 06/06/22 0336 06/07/22 0414 06/08/22 0200  WBC 6.8 7.0 7.2 7.8 8.0  NEUTROABS  --  4.8  --   --   --   HGB 9.8* 10.2* 9.7* 9.6* 9.1*  HCT 30.5* 31.2* 29.3* 29.5* 28.0*  MCV 78.0* 76.5* 75.1* 76.6* 75.9*  PLT 297 301 268 288 251   Basic Metabolic Panel: Recent Labs  Lab 06/04/22 0235 06/05/22 0427 06/06/22 0336 06/07/22 0414 06/08/22 0200  NA 136 135 136 134* 134*  K 3.6 4.1 3.9 3.7 3.6  CL 107 105 107 102 104  CO2 23 24 24 23 23   GLUCOSE 99 99 106* 93 99  BUN <5* <5* 7* <5* 5*  CREATININE 0.88 0.90 0.76 0.84 0.90  CALCIUM 8.1* 8.1* 8.1* 8.1* 8.0*  MG 1.8 2.2 2.0 1.7 1.7   Liver Function Tests: Recent Labs  Lab 06/03/22 0700  AST 20  ALT 14  ALKPHOS 51  BILITOT 0.5  PROT 6.6  ALBUMIN 2.2*   CBG: No results for input(s): "GLUCAP" in the last 168 hours.  Discharge time spent: approximately 35 minutes spent on discharge counseling, evaluation of patient on day of discharge, and coordination of discharge planning with nursing, social work, pharmacy and case management  Signed: Alberteen Sam, MD [facility] 06/08/2022

## 2022-06-09 ENCOUNTER — Telehealth: Payer: Self-pay | Admitting: Student

## 2022-06-09 DIAGNOSIS — R918 Other nonspecific abnormal finding of lung field: Secondary | ICD-10-CM

## 2022-06-10 ENCOUNTER — Other Ambulatory Visit: Payer: Self-pay | Admitting: *Deleted

## 2022-06-10 ENCOUNTER — Encounter: Payer: Self-pay | Admitting: Student

## 2022-06-10 DIAGNOSIS — Z01812 Encounter for preprocedural laboratory examination: Secondary | ICD-10-CM

## 2022-06-11 ENCOUNTER — Telehealth: Payer: Self-pay

## 2022-06-11 ENCOUNTER — Other Ambulatory Visit: Payer: Self-pay | Admitting: Hematology and Oncology

## 2022-06-11 DIAGNOSIS — C3490 Malignant neoplasm of unspecified part of unspecified bronchus or lung: Secondary | ICD-10-CM

## 2022-06-11 DIAGNOSIS — C3491 Malignant neoplasm of unspecified part of right bronchus or lung: Secondary | ICD-10-CM

## 2022-06-11 NOTE — Telephone Encounter (Signed)
Called and spoke with wife, moved appt with Dr. Bertis Ruddy to after PET scan on 5/28 at 1 pm, wife is aware of appt.

## 2022-06-12 ENCOUNTER — Other Ambulatory Visit: Payer: Self-pay

## 2022-06-12 ENCOUNTER — Telehealth: Payer: Self-pay | Admitting: Vascular Surgery

## 2022-06-12 DIAGNOSIS — I723 Aneurysm of iliac artery: Secondary | ICD-10-CM

## 2022-06-12 NOTE — Telephone Encounter (Signed)
Patient wife will call back to schedule apt once she is with the patient to better coordinate a time.

## 2022-06-12 NOTE — Telephone Encounter (Signed)
-----   Message from Maeola Harman, MD sent at 06/07/2022 11:14 AM EDT ----- Benjamin Cook [id number] 16-Nov-1945  Inpatient consult Diagnosis: Abdominal aortic aneurysm status post EVAR with concern for endoleak  Please have patient follow-up in 4 to 6 weeks with me with CT angio with bifemoral runoff to evaluate for EVAR endoleak and left popliteal aneurysm.  Benjamin Cook

## 2022-06-14 ENCOUNTER — Ambulatory Visit: Payer: Medicare HMO | Admitting: Hematology and Oncology

## 2022-06-17 ENCOUNTER — Other Ambulatory Visit: Payer: Medicare HMO

## 2022-06-17 DIAGNOSIS — Z01812 Encounter for preprocedural laboratory examination: Secondary | ICD-10-CM

## 2022-06-18 ENCOUNTER — Encounter (HOSPITAL_COMMUNITY): Payer: Self-pay | Admitting: Student

## 2022-06-18 ENCOUNTER — Other Ambulatory Visit: Payer: Self-pay

## 2022-06-18 ENCOUNTER — Encounter: Payer: Self-pay | Admitting: Internal Medicine

## 2022-06-18 NOTE — Progress Notes (Signed)
PERIOPERATIVE PRESCRIPTION FOR IMPLANTED CARDIAC DEVICE PROGRAMMING  Patient Information: Name:  Benjamin Cook  DOB:  Dec 12, 1945  MRN:  [id number]    Planned Procedure:  Endobronchial Ultrasound  Surgeon:  Dr. Felisa Bonier  Date of Procedure:  06/20/2022  Cautery will be used.  Position during surgery:  Supine   Please send documentation back to:  [facility] (Fax # [phone])  Device Information:  Clinic EP Physician:  [name], MD   Device Type:  Defibrillator Manufacturer and Phone #:  St. Jude/Abbott: [phone] Pacemaker Dependent?:  No. Date of Last Device Check:  06/03/2022 Normal Device Function?:  Yes.    Electrophysiologist's Recommendations:  Have magnet available. Provide continuous ECG monitoring when magnet is used or reprogramming is to be performed.  Procedure may interfere with device function.  Magnet should be placed over device during procedure.  Per Device Clinic Standing Orders, [name], RN  12:32 PM 06/18/2022

## 2022-06-18 NOTE — Progress Notes (Signed)
SDW call  Patient was given pre-op instructions over the phone. Patient verbalized understanding of instructions provided.    PCP - Dr. Velta Addison Cardiologist - Dr. [name] EP: Dr. [name] Vascular: Dr Lemar Livings Oncology: Dr. Bertis Ruddy Pulmonary:    PPM/ICD - Yes.  St. Jude [employer].   Device Orders - Received Rep Notified - Yes, spoke to Lenox Ahr, will have a rep available DOS   Chest x-ray - 06/02/2022 EKG -  06/04/2022 Stress Test - ECHO - 06/03/2022 Cardiac Cath - 10/12/2020  Sleep Study/sleep apnea/CPAP: Denies  Non-diabetic  Blood Thinner Instructions: Xarelto, last dose 06/16/2022 Aspirin Instructions: Denies   ERAS Protcol - No, NPO PRE-SURGERY Ensure or G2-    COVID TEST- [place] 06/17/2022, awaiting results    Anesthesia review: Yes. AAA, CHF, ICD, HTN, A-Fib, COPD, symptomatic VT, PVD   Patient denies shortness of breath, fever, cough and chest pain over the phone call  Your procedure is scheduled on Thursday Jun 20, 2022  Report to [facility] Main Entrance "A" at  0645  A.M., then check in with the Admitting office.  Call this number if you have problems the morning of surgery:  [phone]   If you have any questions prior to your surgery date call [phone]: Open Monday-Friday 8am-4pm If you experience any cold or flu symptoms such as cough, fever, chills, shortness of breath, etc. between now and your scheduled surgery, please notify us at the above number    Remember:  Do not eat or drink after midnight the night before your surgery  Take these medicines the morning of surgery with A SIP OF WATER:  Amiodarone, isosorbide, metoprolol, dulera inhaler, spironolactone  As needed: tylenol  As of today, STOP taking any Aspirin (unless otherwise instructed by your surgeon) Aleve, Naproxen, Ibuprofen, Motrin, Advil, Goody's, BC's, all herbal medications, fish oil, and all vitamins.

## 2022-06-19 ENCOUNTER — Ambulatory Visit: Payer: Medicare HMO | Attending: Cardiovascular Disease | Admitting: Cardiovascular Disease

## 2022-06-19 ENCOUNTER — Encounter: Payer: Self-pay | Admitting: Cardiovascular Disease

## 2022-06-19 ENCOUNTER — Encounter (HOSPITAL_COMMUNITY): Payer: Self-pay | Admitting: Student

## 2022-06-19 VITALS — BP 126/80 | HR 62 | Ht 71.0 in | Wt 132.0 lb

## 2022-06-19 DIAGNOSIS — I480 Paroxysmal atrial fibrillation: Secondary | ICD-10-CM

## 2022-06-19 LAB — NOVEL CORONAVIRUS, NAA: SARS-CoV-2, NAA: NOT DETECTED

## 2022-06-19 NOTE — Anesthesia Preprocedure Evaluation (Signed)
Anesthesia Evaluation  Patient identified by MRN, date of birth, ID band Patient awake    Reviewed: Allergy & Precautions, H&P , NPO status , Patient's Chart, lab work & pertinent test results  Airway Mallampati: IV  TM Distance: >3 FB Neck ROM: Full    Dental  (+) Edentulous Upper, Edentulous Lower   Pulmonary COPD, Current Smoker R hilar mass   Pulmonary exam normal breath sounds clear to auscultation       Cardiovascular hypertension, Pt. on medications + CAD (PCI 2001, cath 2022 with multivessel disease but no targets and not surgical candidate), + Peripheral Vascular Disease and +CHF (LVEF 20-25%)  Normal cardiovascular exam+ dysrhythmias (xarelto) Atrial Fibrillation + Cardiac Defibrillator  Rhythm:Regular Rate:Normal   CAD (silent anterior MI, s/p LAD stent 08/02/99), ischemic cardiomyopathy, HFrEF, PAF, NSVT (ICD shocks for VT 04/21/19,06/01/20, 10/10/20, s/p VT ablation 10/26/20; recurrent NSVT 01/22/22, 06/02/22), AICD (05/25/10, generator replacement and pocket revision 03/18/14; implant of new ICD RV lead 07/17/16 due to lead fracture), HTN, HLD, AAA (s/p Aortoiliac endograft/EVAR 03/03/17)    admission 06/02/22 - 06/08/22 for dizziness, presyncope with ICD interrogation showing runs for VT and started on IV amiodarone. Imaging showed previously noted lung nodule to have doubled in size since 10/2021 to 4 cm. Pulmonology consulted and recommended EBUS which was planned while in-patient but given breakfast, so rescheduled as out-patient for 06/20/22. IV iron gave for HGB 9.7. [place] of abdominal aorta showed 6.1 cm AAA, possible endoleak, so Dr. Randie Heinz consulted and will plan for CTA with BLE runoff in 4-6 weeks planned. He noted, "there is no pulsatility in the aortic aneurysm palpation to suggest any concerning features of the aneurysm sac at this time."    Echo 06/03/22: IMPRESSIONS  1. Global hypokinesis with akinesis of the septum  and aneurysm formation  of apex;overall severe LV dysfunction.  2. Left ventricular ejection fraction, by estimation, is 20 to 25%. The  left ventricle has severely decreased function. The left ventricle  demonstrates regional wall motion abnormalities (see scoring  diagram/findings for description). The left  ventricular internal cavity size was mildly dilated. Left ventricular  diastolic parameters are consistent with Grade I diastolic dysfunction  (impaired relaxation).  3. Right ventricular systolic function is normal. The right ventricular  size is normal.  4. The mitral valve is normal in structure. No evidence of mitral valve  regurgitation. No evidence of mitral stenosis.  5. The aortic valve is tricuspid. Aortic valve regurgitation is not  visualized. Aortic valve sclerosis is present, with no evidence of aortic  valve stenosis.  6. The inferior vena cava is normal in size with greater than 50%  respiratory variability, suggesting right atrial pressure of 3 mmHg.  - Comparison LVEF 10-15% 06/04/07; 30% 10/28/08, 30-35% 03/29/15, 35% 04/09/19, 30-35% 10/11/20   Cardiac cath 10/12/20:   Mid Cx lesion is 70% stenosed.   Ost LM to Dist LM lesion is 75% stenosed.   Mid RCA lesion is 70% stenosed.   Prox RCA to Mid RCA lesion is 50% stenosed.   Previously placed Mid LAD stent (unknown type) is  widely patent. IMPRESSION:  Benjamin Cook has 75% calcified distal left main.  His previously placed mid LAD stent is widely patent.  He does have a 70% mid AV groove circumflex and RCA disease.  I do not think he is an operative candidate.  It is certainly possible that this is contributory to his ventricular tachycardia.  Medical therapy he will be recommended.  Neuro/Psych  PSYCHIATRIC DISORDERS Anxiety     negative neurological ROS     GI/Hepatic negative GI ROS, Neg liver ROS,,,  Endo/Other  negative endocrine ROS    Renal/GU negative Renal ROS  negative genitourinary    Musculoskeletal  (+) Arthritis , Osteoarthritis,    Abdominal   Peds negative pediatric ROS (+)  Hematology negative hematology ROS (+)   Anesthesia Other Findings   Reproductive/Obstetrics negative OB ROS                             Anesthesia Physical Anesthesia Plan  ASA: 4  Anesthesia Plan: General   Post-op Pain Management:    Induction: Intravenous  PONV Risk Score and Plan: 1 and Ondansetron, Dexamethasone and Treatment may vary due to age or medical condition  Airway Management Planned: Oral ETT  Additional Equipment: ClearSight  Intra-op Plan:   Post-operative Plan: Extubation in OR  Informed Consent: I have reviewed the patients History and Physical, chart, labs and discussed the procedure including the risks, benefits and alternatives for the proposed anesthesia with the patient or authorized representative who has indicated his/her understanding and acceptance.     Dental advisory given  Plan Discussed with: CRNA  Anesthesia Plan Comments: (Many cardiac and vascular comorbidities, d/w pt and wife that he is very high risk for perioperative CV complications and they have agreed to proceed. They still wish for him to be full code.  Clearsight for tight BP and HR control.)       Anesthesia Quick Evaluation

## 2022-06-19 NOTE — Progress Notes (Signed)
Anesthesia Chart Review: Benjamin Cook  Case: [id number] Date/Time: 06/20/22 0915   Procedure: VIDEO BRONCHOSCOPY WITH ENDOBRONCHIAL ULTRASOUND   Anesthesia type: General   Pre-op diagnosis: right hilar mass   [facility] / [facility]   Surgeons: Omar Person, MD       DISCUSSION: Patient is a 77 year old male scheduled for the above procedure.  History includes smoking (cigars, former cigarette smoker), CAD (silent anterior MI, s/p LAD stent 08/02/99), ischemic cardiomyopathy, HFrEF, PAF, NSVT (ICD shocks for VT 04/21/19,06/01/20, 10/10/20, s/p VT ablation 10/26/20; recurrent NSVT 01/22/22, 06/02/22), AICD (05/25/10, generator replacement and pocket revision 03/18/14; implant of new ICD RV lead 07/17/16 due to lead fracture), HTN, HLD, AAA (s/p Aortoiliac endograft/EVAR 03/03/17), COPD, dyspnea, psoriatic arthritis, right adrenal mass (4.2 X 2.6 cm, stable by 10/13/21 CT; reported previous benign biopsy), sigmoid diverticulitis (11/2016).    [place] admission 06/02/22 - 06/08/22 for dizziness, presyncope with ICD interrogation showing runs for VT and started on IV amiodarone. Imaging showed previously noted lung nodule to have doubled in size since 10/2021 to 4 cm. Pulmonology consulted and recommended EBUS which was planned while in-patient but given breakfast, so rescheduled as out-patient for 06/20/22. IV iron gave for HGB 9.7. [place] of abdominal aorta showed 6.1 cm AAA, possible endoleak, so Dr. Randie Heinz consulted and will plan for CTA with BLE runoff in 4-6 weeks planned. He noted, "there is no pulsatility in the aortic aneurysm palpation to suggest any concerning features of the aneurysm sac at this time." EP noted plans for EBUS. Discharge medication recommendations included Entresto, Imdur, metoprolol succinate, amiodarone, spironolactone, Xarelto, Lipitor, KCl.  EP perioperative ICD recommendations: Device Information: Clinic EP Physician:  [name], MD  Device Type:   Defibrillator Manufacturer and Phone #:  St. Jude/Abbott: [phone] Pacemaker Dependent?:  No. Date of Last Device Check:  06/03/2022       Normal Device Function?:  Yes.     Electrophysiologist's Recommendations: Have magnet available. Provide continuous ECG monitoring when magnet is used or reprogramming is to be performed.  Procedure may interfere with device function.  Magnet should be placed over device during procedure.  Last Xarelto reported as 06/16/2022.  Anesthesia team to evaluate on the day of surgery.    VS:  BP Readings from Last 3 Encounters:  06/08/22 126/62  02/08/22 (!) 148/80  01/22/22 (!) 153/75   Pulse Readings from Last 3 Encounters:  06/08/22 60  02/08/22 72  01/22/22 72     PROVIDERS: [name], MD is PCP Olga Millers, MD is cardiologist [name], MD is EP cardiologist Lemar Livings, MD is vascular surgeon Artis Delay, MD is HEM-ONC   LABS: Most recent lab results in [facility] include: Lab Results  Component Value Date   WBC 8.0 06/08/2022   HGB 9.1 (L) 06/08/2022   HCT 28.0 (L) 06/08/2022   PLT 251 06/08/2022   GLUCOSE 99 06/08/2022   ALT 14 06/03/2022   AST 20 06/03/2022   NA 134 (L) 06/08/2022   K 3.6 06/08/2022   CL 104 06/08/2022   CREATININE 0.90 06/08/2022   BUN 5 (L) 06/08/2022   CO2 23 06/08/2022   TSH 0.052 (L) 04/15/2022     IMAGES: CT Head & CTA Head/Neck 06/05/22: IMPRESSION: 1. Combination of CT technique and arterial timing of the contras bolus significantly limits and essentially precludes assessment for intracranial metastatic disease. Within this limitation, no contrast enhancing lesion is visualized. 2. No CT evidence of an  acute cortical infarct. 3. No significant proximal occlusion, aneurysm, or vascular malformation in the intracranial circulation. Mild-to-moderate narrowing of the bifurcation of the right MCA. 4. Likely moderate to severe multifocal stenosis in the V1 segment of the right  vertebral artery. Origin of the left vertebral artery is not assessed due to a combination of streak and motion artifact. 5. Severe centrilobular emphysema. See prior CT chest dated 06/03/2022 for additional intrathoracic findings. 6. Unchanged appearance of the cervical spine vertebral bodies compared to 03/30/21. - Emphysema (ICD10-J43.9).  CT Chest 06/03/22: IMPRESSION: 1. Interval enlargement of suprahilar mass on the right now measuring 4.1 x 4.1 cm, suspicious for neoplasm. PET-CT or biopsy is recommended for further evaluation. 2. Bilateral bronchial wall thickening with multifocal mucous plugging in the lower lobes bilaterally, greater on the left than on the right. 3. Emphysema. 4. Aortic atherosclerosis and coronary artery calcifications.    EKG: 06/04/22:  Normal sinus rhythm Left axis deviation Right bundle branch block T wave abnormality, consider lateral ischemia Abnormal ECG Confirmed by Julien Nordmann [phone]) on 06/04/2022 9:04:41 PM   CV: US Aorta 06/06/22: IMPRESSION: Status post abdominal aortic endograft placement. Distal portion of occluded aneurysmal sac measures 6.1 cm. Endoleak cannot be excluded on the basis of this exam. If there is clinical concern for endoleak, CT angiography is recommended for further evaluation. - Out-patient CTA with BLE runoff planned in ~ 4-6 weeks per vascular surgery.   Echo 06/03/22: IMPRESSIONS   1. Global hypokinesis with akinesis of the septum and aneurysm formation  of apex;overall severe LV dysfunction.   2. Left ventricular ejection fraction, by estimation, is 20 to 25%. The  left ventricle has severely decreased function. The left ventricle  demonstrates regional wall motion abnormalities (see scoring  diagram/findings for description). The left  ventricular internal cavity size was mildly dilated. Left ventricular  diastolic parameters are consistent with Grade I diastolic dysfunction  (impaired relaxation).   3.  Right ventricular systolic function is normal. The right ventricular  size is normal.   4. The mitral valve is normal in structure. No evidence of mitral valve  regurgitation. No evidence of mitral stenosis.   5. The aortic valve is tricuspid. Aortic valve regurgitation is not  visualized. Aortic valve sclerosis is present, with no evidence of aortic  valve stenosis.   6. The inferior vena cava is normal in size with greater than 50%  respiratory variability, suggesting right atrial pressure of 3 mmHg.  - Comparison LVEF 10-15% 06/04/07; 30% 10/28/08, 30-35% 03/29/15, 35% 04/09/19, 30-35% 10/11/20   Cardiac cath 10/12/20:   Mid Cx lesion is 70% stenosed.   Ost LM to Dist LM lesion is 75% stenosed.   Mid RCA lesion is 70% stenosed.   Prox RCA to Mid RCA lesion is 50% stenosed.   Previously placed Mid LAD stent (unknown type) is  widely patent. IMPRESSION:  [name] has 75% calcified distal left main.  His previously placed mid LAD stent is widely patent.  He does have a 70% mid AV groove circumflex and RCA disease.  I do not think he is an operative candidate.  It is certainly possible that this is contributory to his ventricular tachycardia.  Medical therapy he will be recommended.       Past Medical History:  Diagnosis Date   Adrenal mass ([name])    per pt this is remote (10 years) and benign by biopsy   AICD (automatic cardioverter/defibrillator) present    Anxiety    Arthritis  CAD (coronary artery disease)    a. s/p PCI to LAD 2001 b. myoview 2014 high risk with scar LAD/RCA territory but no ischemia   Cardiomyopathy, ischemic    CHF (congestive heart failure) ([name])    class II/III   COPD (chronic obstructive pulmonary disease) ([name])    smokes cigars but has quit cigarettes   Defibrillator discharge 04/21/2019   Diverticulitis    Dyspnea    Flu 03/2015   HTN (hypertension)    Hyperlipidemia    NSVT (nonsustained ventricular tachycardia) ([name]) 03/05/2017   Paroxysmal atrial  fibrillation ([name]) 03/2015   chads2vasc score is 4   PSA (psoriatic arthritis) ([name])    increased   PVD (peripheral vascular disease) ([name])     Past Surgical History:  Procedure Laterality Date   ABDOMINAL AORTIC ENDOVASCULAR STENT GRAFT N/A 03/03/2017   Procedure: ABDOMINAL AORTIC ENDOVASCULAR STENT GRAFT;  Surgeon: Maeola Harman, MD;  Location: [facility] OR;  Service: Vascular;  Laterality: N/A;   cataract surgery     CORONARY ANGIOGRAPHY N/A 10/12/2020   Procedure: CORONARY ANGIOGRAPHY;  Surgeon: Runell Gess, MD;  Location: [facility] INVASIVE CV LAB;  Service: Cardiovascular;  Laterality: N/A;   CORONARY ANGIOPLASTY WITH STENT PLACEMENT  2001   a. PCI to LAD   IMPLANTABLE CARDIOVERTER DEFIBRILLATOR (ICD) GENERATOR CHANGE N/A 03/18/2014   a. SJM Fortify ST DR ICD implanted by [facility] for primary prevention b. gen change 03/2014    LEAD REVISION/REPAIR N/A 07/17/2016   Procedure: Lead Revision/Repair;  Surgeon: Marinus Maw, MD;  Location: [facility] INVASIVE CV LAB;  Service: Cardiovascular;  Laterality: N/A;   LEFT HEART CATHETERIZATION WITH CORONARY ANGIOGRAM N/A 05/27/2014   Procedure: LEFT HEART CATHETERIZATION WITH CORONARY ANGIOGRAM;  Surgeon: Tonny Bollman, MD;  Location: [facility] CATH LAB;  Service: Cardiovascular;  Laterality: N/A;   REPAIR KNEE LIGAMENT     V TACH ABLATION N/A 10/26/2020   Procedure: V TACH ABLATION;  Surgeon: Marinus Maw, MD;  Location: [facility] INVASIVE CV LAB;  Service: Cardiovascular;  Laterality: N/A;    MEDICATIONS: No current facility-administered medications for this encounter.    acetaminophen (TYLENOL) 500 MG tablet   amiodarone (PACERONE) 400 MG tablet   atorvastatin (LIPITOR) 80 MG tablet   isosorbide mononitrate (IMDUR) 30 MG 24 hr tablet   melatonin 5 MG TABS   metoprolol succinate (TOPROL-XL) 50 MG 24 hr tablet   mometasone-formoterol (DULERA) 100-5 MCG/ACT AERO   Multiple Vitamin (MULTIVITAMIN) capsule   potassium chloride SA (KLOR-CON M20) 20 MEQ tablet    rivaroxaban (XARELTO) 20 MG TABS tablet   sacubitril-valsartan (ENTRESTO) 97-103 MG   spironolactone (ALDACTONE) 25 MG tablet    Shonna Chock, PA-C Surgical Short Stay/Anesthesiology [facility] Phone [phone] [facility] Phone [phone] 06/19/2022 3:15 PM

## 2022-06-19 NOTE — Progress Notes (Signed)
Electrophysiology Office Note:    Date:  06/19/2022   ID:  Benjamin Cook, DOB April 12, 1945, MRN [id number]  PCP:  [name], MD   [facility] Providers Cardiologist:  Olga Millers, MD Electrophysiologist:  [name], MD     Referring MD: [name], MD   History of Present Illness:    Benjamin Cook is a 77 y.o. male with a hx listed below, significant for VT, CHFrEF, CAD, EVAR for large abdominal aortic aneurysm in 2019 referred for arrhythmia management.  He has had an ablation of an apical left ventricular VT focus in December 2022.  He had been maintained on amiodarone, but this was stopped in March 2024 for positive ESR. He was admitted in May for multiple VT episodes terminated by ATP.  He was also noted to have a large lung mass concerning for neoplasm. He was reloaded with amiodarone with resolution of his sustained VT and significant improvement in burden of ventricular ectopy and NSVT.    Past Medical History:  Diagnosis Date   AAA (abdominal aortic aneurysm) ([name])    Adrenal mass ([name])    per pt this is remote (10 years) and benign by biopsy   AICD (automatic cardioverter/defibrillator) present    Anxiety    Arthritis    CAD (coronary artery disease)    a. s/p PCI to LAD 2001 b. myoview 2014 high risk with scar LAD/RCA territory but no ischemia   Cardiomyopathy, ischemic    CHF (congestive heart failure) ([name])    class II/III   COPD (chronic obstructive pulmonary disease) ([name])    smokes cigars but has quit cigarettes   Defibrillator discharge 04/21/2019   Diverticulitis    Dyspnea    Flu 03/2015   HTN (hypertension)    Hyperlipidemia    NSVT (nonsustained ventricular tachycardia) ([name]) 03/05/2017   Paroxysmal atrial fibrillation ([name]) 03/2015   chads2vasc score is 4   PSA (psoriatic arthritis) ([name])    increased   PVD (peripheral vascular disease) ([name])     Past Surgical History:  Procedure Laterality Date   ABDOMINAL  AORTIC ENDOVASCULAR STENT GRAFT N/A 03/03/2017   Procedure: ABDOMINAL AORTIC ENDOVASCULAR STENT GRAFT;  Surgeon: Maeola Harman, MD;  Location: [facility] OR;  Service: Vascular;  Laterality: N/A;   cataract surgery     CORONARY ANGIOGRAPHY N/A 10/12/2020   Procedure: CORONARY ANGIOGRAPHY;  Surgeon: Runell Gess, MD;  Location: [facility] INVASIVE CV LAB;  Service: Cardiovascular;  Laterality: N/A;   CORONARY ANGIOPLASTY WITH STENT PLACEMENT  2001   a. PCI to LAD   IMPLANTABLE CARDIOVERTER DEFIBRILLATOR (ICD) GENERATOR CHANGE N/A 03/18/2014   a. SJM Fortify ST DR ICD implanted by [facility] for primary prevention b. gen change 03/2014    LEAD REVISION/REPAIR N/A 07/17/2016   Procedure: Lead Revision/Repair;  Surgeon: Marinus Maw, MD;  Location: [facility] INVASIVE CV LAB;  Service: Cardiovascular;  Laterality: N/A;   LEFT HEART CATHETERIZATION WITH CORONARY ANGIOGRAM N/A 05/27/2014   Procedure: LEFT HEART CATHETERIZATION WITH CORONARY ANGIOGRAM;  Surgeon: Tonny Bollman, MD;  Location: [facility] CATH LAB;  Service: Cardiovascular;  Laterality: N/A;   REPAIR KNEE LIGAMENT     V TACH ABLATION N/A 10/26/2020   Procedure: V TACH ABLATION;  Surgeon: Marinus Maw, MD;  Location: [facility] INVASIVE CV LAB;  Service: Cardiovascular;  Laterality: N/A;    Current Medications: Current Meds  Medication Sig   acetaminophen (TYLENOL) 500 MG tablet Take 500-1,000 mg by mouth every 8 (  eight) hours as needed (for pain).    amiodarone (PACERONE) 400 MG tablet Take 0.5 tablets (200 mg total) by mouth 2 (two) times daily.   atorvastatin (LIPITOR) 80 MG tablet TAKE 1 TABLET BY MOUTH DAILY AT 6 PM (Patient taking differently: Take 80 mg by mouth daily. TAKE 1 TABLET BY MOUTH  DAILY AT 6 PM)   isosorbide mononitrate (IMDUR) 30 MG 24 hr tablet TAKE 1/2 OF A TABLET (15 MG TOTAL) BY MOUTH DAILY (Patient taking differently: Take 30 mg by mouth daily.)   melatonin 5 MG TABS Take 1 tablet (5 mg total) by mouth at bedtime.   metoprolol succinate  (TOPROL-XL) 50 MG 24 hr tablet Take 1 tablet (50 mg total) by mouth 2 (two) times daily. Take with or immediately following a meal.   mometasone-formoterol (DULERA) 100-5 MCG/ACT AERO Inhale 2 puffs into the lungs in the morning and at bedtime.   Multiple Vitamin (MULTIVITAMIN) capsule Take 1 capsule by mouth daily.   pantoprazole (PROTONIX) 40 MG tablet Take 40 mg by mouth daily.   potassium chloride SA (KLOR-CON M20) 20 MEQ tablet TAKE 1 TABLET (20 MEQ TOTAL) BY MOUTH DAILY. PLEASE SCHEDULE APPOINTMENT FOR ADDITIONAL REFILLS. (Patient taking differently: Take 20 mEq by mouth daily.)   rivaroxaban (XARELTO) 20 MG TABS tablet Take 1 tablet (20 mg total) by mouth daily with supper.   sacubitril-valsartan (ENTRESTO) 97-103 MG TAKE 1 TABLET BY MOUTH TWICE A DAY (Patient taking differently: Take 1 tablet by mouth 2 (two) times daily.)   spironolactone (ALDACTONE) 25 MG tablet Take 1 tablet (25 mg total) by mouth daily.     Allergies:   Patient has no known allergies.   Social and Family History: Reviewed in Epic  ROS:   Please see the history of present illness.    All other systems reviewed and are negative.  EKGs/Labs/Other Studies Reviewed Today:    Echocardiogram:  TTE 06/03/22 EF 20-25%   Monitors:   Stress testing:   Advanced imaging:   Cardiac catherization   EKG:  Last EKG results: sinus rhythm, RBBB    Recent Labs: 01/22/2022: B Natriuretic Peptide 228.2 04/15/2022: TSH 0.052 06/03/2022: ALT 14 06/08/2022: BUN 5; Creatinine, Ser 0.90; Hemoglobin 9.1; Magnesium 1.7; Platelets 251; Potassium 3.6; Sodium 134     Physical Exam:    VS:  BP 126/80   Pulse 62   Ht 5\' 11"  (1.803 m)   Wt 132 lb (59.9 kg)   SpO2 97%   BMI 18.41 kg/m     Wt Readings from Last 3 Encounters:  06/19/22 132 lb (59.9 kg)  06/06/22 199 lb 11.2 oz (90.6 kg)  02/08/22 147 lb (66.7 kg)     GEN:  Well nourished, well developed in no acute distress CARDIAC: RRR, no murmurs, rubs,  gallops RESPIRATORY:  Normal work of breathing MUSCULOSKELETAL: no edema    ASSESSMENT & PLAN:    Ventricular tachycardia Status post ablation December 2022 open apical LV focus Pt had recurrence shortly after discontinuation of amiodarone Continue amiodarone, benefit outweighs risk  Paroxysmal atrial fibrillation Maintaining sinus rhythm on amiodarone Continue xarelto 20 -- ok to hold for procedures  Pulmonary mass EBUS and biopsy is scheduled for May 16    CHFrEF Has appointment with Dr. [name] on May 2024 Imdur 30, Dr. 30, Entresto 97-100, metoprolol XL 50  Aortic aneurysm Status post EVAR, concern for leak           Medication Adjustments/Labs and Tests Ordered: Current medicines are reviewed  at length with the patient today.  Concerns regarding medicines are outlined above.  No orders of the defined types were placed in this encounter.  No orders of the defined types were placed in this encounter.    Signed, Maurice Small, MD  06/19/2022 4:26 PM    [facility]

## 2022-06-19 NOTE — Patient Instructions (Signed)
Medication Instructions:  Your physician recommends that you continue on your current medications as directed. Please refer to the Current Medication list given to you today. *If you need a refill on your cardiac medications before your next appointment, please call your pharmacy*   Follow-Up: At [facility], you and your health needs are our priority.  As part of our continuing mission to provide you with exceptional heart care, we have created designated Provider Care Teams.  These Care Teams include your primary Cardiologist (physician) and Advanced Practice Providers (APPs -  Physician Assistants and Nurse Practitioners) who all work together to provide you with the care you need, when you need it.  We recommend signing up for the patient portal called "MyChart".  Sign up information is provided on this After Visit Summary.  MyChart is used to connect with patients for Virtual Visits (Telemedicine).  Patients are able to view lab/test results, encounter notes, upcoming appointments, etc.  Non-urgent messages can be sent to your provider as well.   To learn more about what you can do with MyChart, go to [email or web address].    Your next appointment:   6 month(s)  Provider:   You will see one of the following Advanced Practice Providers on your designated Care Team:   Renee Ursuy, PA-C Michael "Andy" Tillery, PA-C 

## 2022-06-19 NOTE — Progress Notes (Signed)
HPI: FU coronary disease, ischemic cardiomyopathy, PAF, prior ICD, hypertension and hyperlipidemia. Cardiac history dates back to June of 2001. At that time the patient was found to have an abnormal electrocardiogram. Catheterization revealed a 95% mid LAD and a 70% diffuse right coronary artery. His ejection fraction was 25-30%. He had PCI of his LAD at that time. Patient also with paroxysmal atrial fibrillation noted on previous ICD interrogation.  Presented with ventricular tachycardia September 2022.  Catheterization September 2022 showed 70% mid circumflex, 75% left main, 70% right coronary artery. Had VT ablation September 2022.  Echocardiogram April 2024 showed global hypokinesis with aneurysm formation of the apex and ejection fraction 20 to 25%, mild left ventricular enlargement, grade 1 diastolic dysfunction.  Also with history of abdominal aortic aneurysm repair.  Abdominal ultrasound May 2024 showed abdominal aortic endograft placement and distal portion of occluded aneurysmal sac measuring 6.1 cm.  Again presented with recurrent ventricular tachycardia April 2024.  Amiodarone was resumed (amiodarone previously had been discontinued due to elevated ESR).  Patient also found to have lung mass during that admission.  Since last seen, he does have dyspnea on exertion.  He denies orthopnea, PND, pedal edema, chest pain or syncope.  Current Outpatient Medications  Medication Sig Dispense Refill   acetaminophen (TYLENOL) 500 MG tablet Take 500-1,000 mg by mouth every 8 (eight) hours as needed (for pain).      amiodarone (PACERONE) 400 MG tablet Take 0.5 tablets (200 mg total) by mouth 2 (two) times daily. 28 tablet 0   atorvastatin (LIPITOR) 80 MG tablet TAKE 1 TABLET BY MOUTH DAILY AT 6 PM (Patient taking differently: Take 80 mg by mouth daily. TAKE 1 TABLET BY MOUTH  DAILY AT 6 PM) 90 tablet 3   isosorbide mononitrate (IMDUR) 30 MG 24 hr tablet TAKE 1/2 OF A TABLET (15 MG TOTAL) BY MOUTH DAILY  (Patient taking differently: Take 30 mg by mouth daily.) 45 tablet 3   melatonin 5 MG TABS Take 1 tablet (5 mg total) by mouth at bedtime. 30 tablet 0   metoprolol succinate (TOPROL-XL) 50 MG 24 hr tablet Take 1 tablet (50 mg total) by mouth 2 (two) times daily. Take with or immediately following a meal. 30 tablet 3   mometasone-formoterol (DULERA) 100-5 MCG/ACT AERO Inhale 2 puffs into the lungs in the morning and at bedtime. 8.8 each 1   Multiple Vitamin (MULTIVITAMIN) capsule Take 1 capsule by mouth daily.     pantoprazole (PROTONIX) 40 MG tablet Take 40 mg by mouth daily.     potassium chloride SA (KLOR-CON M20) 20 MEQ tablet TAKE 1 TABLET (20 MEQ TOTAL) BY MOUTH DAILY. PLEASE SCHEDULE APPOINTMENT FOR ADDITIONAL REFILLS. (Patient taking differently: Take 20 mEq by mouth daily.) 90 tablet 2   rivaroxaban (XARELTO) 20 MG TABS tablet Take 1 tablet (20 mg total) by mouth daily with supper. 30 tablet 3   sacubitril-valsartan (ENTRESTO) 97-103 MG TAKE 1 TABLET BY MOUTH TWICE A DAY (Patient taking differently: Take 1 tablet by mouth 2 (two) times daily.) 180 tablet 3   spironolactone (ALDACTONE) 25 MG tablet Take 1 tablet (25 mg total) by mouth daily. 30 tablet 3   No current facility-administered medications for this visit.     Past Medical History:  Diagnosis Date   AAA (abdominal aortic aneurysm) ([name])    Adrenal mass ([name])    per pt this is remote (10 years) and benign by biopsy   AICD (automatic cardioverter/defibrillator) present  Anxiety    Arthritis    CAD (coronary artery disease)    a. s/p PCI to LAD 2001 b. myoview 2014 high risk with scar LAD/RCA territory but no ischemia   Cardiomyopathy, ischemic    CHF (congestive heart failure) ([name])    class II/III   COPD (chronic obstructive pulmonary disease) ([name])    smokes cigars but has quit cigarettes   Defibrillator discharge 04/21/2019   Diverticulitis    Dyspnea    Flu 03/2015   HTN (hypertension)    Hyperlipidemia    NSVT  (nonsustained ventricular tachycardia) ([name]) 03/05/2017   Paroxysmal atrial fibrillation ([name]) 03/2015   chads2vasc score is 4   PSA (psoriatic arthritis) ([name])    increased   PVD (peripheral vascular disease) ([name])     Past Surgical History:  Procedure Laterality Date   ABDOMINAL AORTIC ENDOVASCULAR STENT GRAFT N/A 03/03/2017   Procedure: ABDOMINAL AORTIC ENDOVASCULAR STENT GRAFT;  Surgeon: Maeola Harman, MD;  Location: [facility] OR;  Service: Vascular;  Laterality: N/A;   BRONCHIAL NEEDLE ASPIRATION BIOPSY  06/20/2022   Procedure: BRONCHIAL NEEDLE ASPIRATION BIOPSIES;  Surgeon: Omar Person, MD;  Location: [facility] ENDOSCOPY;  Service: Pulmonary;;   cataract surgery     CORONARY ANGIOGRAPHY N/A 10/12/2020   Procedure: CORONARY ANGIOGRAPHY;  Surgeon: Runell Gess, MD;  Location: [facility] INVASIVE CV LAB;  Service: Cardiovascular;  Laterality: N/A;   CORONARY ANGIOPLASTY WITH STENT PLACEMENT  2001   a. PCI to LAD   IMPLANTABLE CARDIOVERTER DEFIBRILLATOR (ICD) GENERATOR CHANGE N/A 03/18/2014   a. SJM Fortify ST DR ICD implanted by [facility] for primary prevention b. gen change 03/2014    LEAD REVISION/REPAIR N/A 07/17/2016   Procedure: Lead Revision/Repair;  Surgeon: Marinus Maw, MD;  Location: [facility] INVASIVE CV LAB;  Service: Cardiovascular;  Laterality: N/A;   LEFT HEART CATHETERIZATION WITH CORONARY ANGIOGRAM N/A 05/27/2014   Procedure: LEFT HEART CATHETERIZATION WITH CORONARY ANGIOGRAM;  Surgeon: Tonny Bollman, MD;  Location: [facility] CATH LAB;  Service: Cardiovascular;  Laterality: N/A;   REPAIR KNEE LIGAMENT     V TACH ABLATION N/A 10/26/2020   Procedure: V TACH ABLATION;  Surgeon: Marinus Maw, MD;  Location: [facility] INVASIVE CV LAB;  Service: Cardiovascular;  Laterality: N/A;   VIDEO BRONCHOSCOPY WITH ENDOBRONCHIAL ULTRASOUND N/A 06/20/2022   Procedure: VIDEO BRONCHOSCOPY WITH ENDOBRONCHIAL ULTRASOUND;  Surgeon: Omar Person, MD;  Location: [facility] ENDOSCOPY;  Service: Pulmonary;  Laterality: N/A;     Social History   Socioeconomic History   Marital status: Married    Spouse name: Not on file   Number of children: Not on file   Years of education: Not on file   Highest education level: Not on file  Occupational History   Occupation: Lawn care  Tobacco Use   Smoking status: Some Days    Types: Cigars    Last attempt to quit: 2020    Years since quitting: 4.3   Smokeless tobacco: Never   Tobacco comments:    smokes cigars now, no longer smokes cigarettes but previously smoked 2ppd  Vaping Use   Vaping Use: Never used  Substance and Sexual Activity   Alcohol use: No   Drug use: No   Sexual activity: Not Currently    Partners: Female    Birth control/protection: None  Other Topics Concern   Not on file  Social History Narrative   Not on file   Social Determinants of Health   Financial Resource Strain: Not on file  Food  Insecurity: No Food Insecurity (06/03/2022)   Hunger Vital Sign    Worried About Running Out of Food in the Last Year: Never true    Ran Out of Food in the Last Year: Never true  Transportation Needs: No Transportation Needs (06/03/2022)   PRAPARE - Administrator, Civil Service (Medical): No    Lack of Transportation (Non-Medical): No  Physical Activity: Not on file  Stress: Not on file  Social Connections: Not on file  Intimate Partner Violence: Not At Risk (06/03/2022)   Humiliation, Afraid, Rape, and Kick questionnaire    Fear of Current or Ex-Partner: No    Emotionally Abused: No    Physically Abused: No    Sexually Abused: No    Family History  Problem Relation Age of Onset   Cirrhosis Mother        due to ETOH   Cancer Neg Hx     ROS: no fevers or chills, productive cough, hemoptysis, dysphasia, odynophagia, melena, hematochezia, dysuria, hematuria, rash, seizure activity, orthopnea, PND, pedal edema, claudication. Remaining systems are negative.  Physical Exam: Well-developed well-nourished in no acute distress.  Skin  is warm and dry.  HEENT is normal.  Neck is supple.  Chest with diminished breath sounds and mild expiratory wheeze Cardiovascular exam is regular rate and rhythm.  Abdominal exam nontender or distended. No masses palpated. Extremities show no edema. neuro grossly intact   A/P  1 ventricular tachycardia-patient has done well on amiodarone.  Will continue.  2 coronary artery disease-patient denies chest pain.  Continue statin.  No aspirin given need for Xarelto.  3 ischemic cardiomyopathy-continue Entresto and beta-blocker.  4 status post repair of abdominal aortic aneurysm-follow-up vascular surgery.  5 hypertension-blood pressure controlled.  Continue present medications.  6 paroxysmal atrial fibrillation-Continue beta-blocker and Xarelto.  7 ICD-followed by Dr. Ladona Ridgel.  8 lung mass-Per pulmonary and oncology.  Biopsy consistent with non-small cell carcinoma.  9 hyperlipidemia-continue statin.  Olga Millers, MD

## 2022-06-20 ENCOUNTER — Encounter (HOSPITAL_COMMUNITY)
Admission: RE | Disposition: A | Discharge status: Home / Self Care | Payer: Self-pay | Source: Home / Self Care | Attending: Student

## 2022-06-20 ENCOUNTER — Ambulatory Visit (HOSPITAL_COMMUNITY)
Admission: RE | Admit: 2022-06-20 | Discharge: 2022-06-20 | Disposition: A | Discharge status: Home / Self Care | Payer: Medicare HMO | Attending: Student | Admitting: Student

## 2022-06-20 ENCOUNTER — Ambulatory Visit (HOSPITAL_COMMUNITY): Payer: Medicare HMO | Admitting: Vascular Surgery

## 2022-06-20 ENCOUNTER — Ambulatory Visit (HOSPITAL_COMMUNITY): Payer: Medicare HMO

## 2022-06-20 ENCOUNTER — Ambulatory Visit (HOSPITAL_BASED_OUTPATIENT_CLINIC_OR_DEPARTMENT_OTHER): Payer: Medicare HMO | Admitting: Vascular Surgery

## 2022-06-20 ENCOUNTER — Encounter (HOSPITAL_COMMUNITY): Payer: Self-pay | Admitting: Student

## 2022-06-20 DIAGNOSIS — Z95810 Presence of automatic (implantable) cardiac defibrillator: Secondary | ICD-10-CM | POA: Insufficient documentation

## 2022-06-20 DIAGNOSIS — I4729 Other ventricular tachycardia: Secondary | ICD-10-CM | POA: Diagnosis not present

## 2022-06-20 DIAGNOSIS — I509 Heart failure, unspecified: Secondary | ICD-10-CM | POA: Diagnosis not present

## 2022-06-20 DIAGNOSIS — Z7951 Long term (current) use of inhaled steroids: Secondary | ICD-10-CM | POA: Insufficient documentation

## 2022-06-20 DIAGNOSIS — Z7901 Long term (current) use of anticoagulants: Secondary | ICD-10-CM | POA: Diagnosis not present

## 2022-06-20 DIAGNOSIS — F17290 Nicotine dependence, other tobacco product, uncomplicated: Secondary | ICD-10-CM | POA: Insufficient documentation

## 2022-06-20 DIAGNOSIS — I2510 Atherosclerotic heart disease of native coronary artery without angina pectoris: Secondary | ICD-10-CM

## 2022-06-20 DIAGNOSIS — I739 Peripheral vascular disease, unspecified: Secondary | ICD-10-CM | POA: Insufficient documentation

## 2022-06-20 DIAGNOSIS — J449 Chronic obstructive pulmonary disease, unspecified: Secondary | ICD-10-CM | POA: Diagnosis not present

## 2022-06-20 DIAGNOSIS — F17200 Nicotine dependence, unspecified, uncomplicated: Secondary | ICD-10-CM

## 2022-06-20 DIAGNOSIS — I110 Hypertensive heart disease with heart failure: Secondary | ICD-10-CM | POA: Insufficient documentation

## 2022-06-20 DIAGNOSIS — C3491 Malignant neoplasm of unspecified part of right bronchus or lung: Secondary | ICD-10-CM | POA: Diagnosis not present

## 2022-06-20 DIAGNOSIS — I5022 Chronic systolic (congestive) heart failure: Secondary | ICD-10-CM | POA: Diagnosis not present

## 2022-06-20 DIAGNOSIS — C3401 Malignant neoplasm of right main bronchus: Secondary | ICD-10-CM | POA: Insufficient documentation

## 2022-06-20 DIAGNOSIS — E785 Hyperlipidemia, unspecified: Secondary | ICD-10-CM | POA: Insufficient documentation

## 2022-06-20 DIAGNOSIS — I480 Paroxysmal atrial fibrillation: Secondary | ICD-10-CM | POA: Diagnosis not present

## 2022-06-20 DIAGNOSIS — R918 Other nonspecific abnormal finding of lung field: Secondary | ICD-10-CM

## 2022-06-20 DIAGNOSIS — I255 Ischemic cardiomyopathy: Secondary | ICD-10-CM | POA: Diagnosis not present

## 2022-06-20 DIAGNOSIS — C3490 Malignant neoplasm of unspecified part of unspecified bronchus or lung: Secondary | ICD-10-CM | POA: Diagnosis present

## 2022-06-20 HISTORY — PX: VIDEO BRONCHOSCOPY WITH ENDOBRONCHIAL ULTRASOUND: SHX6177

## 2022-06-20 HISTORY — DX: Abdominal aortic aneurysm, without rupture, unspecified: I71.40

## 2022-06-20 HISTORY — PX: BRONCHIAL NEEDLE ASPIRATION BIOPSY: SHX5106

## 2022-06-20 LAB — BASIC METABOLIC PANEL
Anion gap: 8 (ref 5–15)
BUN: 9 mg/dL (ref 8–23)
CO2: 21 mmol/L — ABNORMAL LOW (ref 22–32)
Calcium: 8.5 mg/dL — ABNORMAL LOW (ref 8.9–10.3)
Chloride: 104 mmol/L (ref 98–111)
Creatinine, Ser: 0.87 mg/dL (ref 0.61–1.24)
GFR, Estimated: >60 mL/min (ref >60)
Glucose, Bld: 97 mg/dL (ref 70–99)
Potassium: 4.8 mmol/L (ref 3.5–5.1)
Sodium: 133 mmol/L — ABNORMAL LOW (ref 135–145)

## 2022-06-20 LAB — CBC
HCT: 33.8 % — ABNORMAL LOW (ref 39.0–52.0)
Hemoglobin: 10.3 g/dL — ABNORMAL LOW (ref 13.0–17.0)
MCH: 24.5 pg — ABNORMAL LOW (ref 26.0–34.0)
MCHC: 30.5 g/dL (ref 30.0–36.0)
MCV: 80.3 fL (ref 80.0–100.0)
Platelets: 323 10*3/uL (ref 150–400)
RBC: 4.21 MIL/uL — ABNORMAL LOW (ref 4.22–5.81)
RDW: 20.4 % — ABNORMAL HIGH (ref 11.5–15.5)
WBC: 9 10*3/uL (ref 4.0–10.5)
nRBC: 0 % (ref 0.0–0.2)

## 2022-06-20 SURGERY — BRONCHOSCOPY, WITH EBUS
Anesthesia: General

## 2022-06-20 MED ORDER — ONDANSETRON HCL 4 MG/2ML IJ SOLN
INTRAMUSCULAR | Status: DC | PRN
  Administered 2022-06-20: 4 mg via INTRAVENOUS

## 2022-06-20 MED ORDER — FENTANYL CITRATE (PF) 100 MCG/2ML IJ SOLN
INTRAMUSCULAR | Status: AC
  Filled 2022-06-20: qty 2

## 2022-06-20 MED ORDER — ONDANSETRON HCL 4 MG/2ML IJ SOLN: 4.0000 mg | Freq: Once | INTRAMUSCULAR | Status: DC | PRN

## 2022-06-20 MED ORDER — ESMOLOL HCL 100 MG/10ML IV SOLN
INTRAVENOUS | Status: DC | PRN
  Administered 2022-06-20 (×2): 20 mg via INTRAVENOUS
  Administered 2022-06-20 (×2): 10 mg via INTRAVENOUS

## 2022-06-20 MED ORDER — DEXAMETHASONE SODIUM PHOSPHATE 10 MG/ML IJ SOLN
INTRAMUSCULAR | Status: DC | PRN
  Administered 2022-06-20: 5 mg via INTRAVENOUS

## 2022-06-20 MED ORDER — FENTANYL CITRATE (PF) 100 MCG/2ML IJ SOLN: 25.0000 ug | INTRAMUSCULAR | Status: DC | PRN

## 2022-06-20 MED ORDER — LIDOCAINE 2% (20 MG/ML) 5 ML SYRINGE
INTRAMUSCULAR | Status: DC | PRN
  Administered 2022-06-20: 60 mg via INTRAVENOUS

## 2022-06-20 MED ORDER — ACETAMINOPHEN 10 MG/ML IV SOLN
1000.0000 mg | Freq: Once | INTRAVENOUS | Status: DC
  Filled 2022-06-20: qty 100

## 2022-06-20 MED ORDER — CHLORHEXIDINE GLUCONATE 0.12 % MT SOLN
15.0000 mL | OROMUCOSAL | Status: AC
  Administered 2022-06-20: 15 mL via OROMUCOSAL
  Filled 2022-06-20: qty 15

## 2022-06-20 MED ORDER — LACTATED RINGERS IV SOLN: INTRAVENOUS | Status: DC

## 2022-06-20 MED ORDER — ROCURONIUM BROMIDE 10 MG/ML (PF) SYRINGE
PREFILLED_SYRINGE | INTRAVENOUS | Status: DC | PRN
  Administered 2022-06-20: 50 mg via INTRAVENOUS

## 2022-06-20 MED ORDER — FENTANYL CITRATE (PF) 250 MCG/5ML IJ SOLN
INTRAMUSCULAR | Status: DC | PRN
  Administered 2022-06-20: 50 ug via INTRAVENOUS

## 2022-06-20 MED ORDER — PHENYLEPHRINE 80 MCG/ML (10ML) SYRINGE FOR IV PUSH (FOR BLOOD PRESSURE SUPPORT)
PREFILLED_SYRINGE | INTRAVENOUS | Status: DC | PRN
  Administered 2022-06-20 (×2): 80 ug via INTRAVENOUS

## 2022-06-20 MED ORDER — IPRATROPIUM-ALBUTEROL 0.5-2.5 (3) MG/3ML IN SOLN: 3.0000 mL | Freq: Once | RESPIRATORY_TRACT | Status: DC

## 2022-06-20 MED ORDER — ETOMIDATE 2 MG/ML IV SOLN
INTRAVENOUS | Status: DC | PRN
  Administered 2022-06-20: 6 mg via INTRAVENOUS
  Administered 2022-06-20: 2 mg via INTRAVENOUS

## 2022-06-20 MED ORDER — SUGAMMADEX SODIUM 200 MG/2ML IV SOLN
INTRAVENOUS | Status: DC | PRN
  Administered 2022-06-20: 200 mg via INTRAVENOUS

## 2022-06-20 NOTE — Anesthesia Procedure Notes (Signed)
Procedure Name: Intubation Date/Time: 06/20/2022 9:29 AM  Performed by: Carlos American, CRNAPre-anesthesia Checklist: Patient identified, Emergency Drugs available, Suction available and Patient being monitored Patient Re-evaluated:Patient Re-evaluated prior to induction Oxygen Delivery Method: Circle System Utilized Preoxygenation: Pre-oxygenation with 100% oxygen Induction Type: IV induction Ventilation: Mask ventilation without difficulty Laryngoscope Size: Mac and 4 Grade View: Grade I Tube type: Oral Tube size: 8.5 mm Number of attempts: 1 Airway Equipment and Method: Stylet Placement Confirmation: ETT inserted through vocal cords under direct vision, positive ETCO2 and breath sounds checked- equal and bilateral Secured at: 19 cm Tube secured with: Tape Dental Injury: Teeth and Oropharynx as per pre-operative assessment

## 2022-06-20 NOTE — Transfer of Care (Signed)
Immediate Anesthesia Transfer of Care Note  Patient: Benjamin Cook  Procedure(s) Performed: VIDEO BRONCHOSCOPY WITH ENDOBRONCHIAL ULTRASOUND BRONCHIAL NEEDLE ASPIRATION BIOPSIES  Patient Location: PACU  Anesthesia Type:General  Level of Consciousness: drowsy and patient cooperative  Airway & Oxygen Therapy: Patient Spontanous Breathing  Post-op Assessment: Report given to RN  Post vital signs: Reviewed and stable  Last Vitals:  Vitals Value Taken Time  BP 145/63 06/20/22 1015  Temp    Pulse 62 06/20/22 1016  Resp 16 06/20/22 1016  SpO2 93 % 06/20/22 1016  Vitals shown include unvalidated device data.  Last Pain:  Vitals:   06/20/22 0701  TempSrc:   PainSc: 0-No pain      Patients Stated Pain Goal: 0 (06/20/22 0701)  Complications: No notable events documented.

## 2022-06-20 NOTE — Anesthesia Postprocedure Evaluation (Signed)
Anesthesia Post Note  Patient: RAVINDRA ALTSCHULER  Procedure(s) Performed: VIDEO BRONCHOSCOPY WITH ENDOBRONCHIAL ULTRASOUND BRONCHIAL NEEDLE ASPIRATION BIOPSIES     Patient location during evaluation: PACU Anesthesia Type: General Level of consciousness: awake and alert, oriented and patient cooperative Pain management: pain level controlled Vital Signs Assessment: post-procedure vital signs reviewed and stable Respiratory status: spontaneous breathing, nonlabored ventilation and respiratory function stable Cardiovascular status: blood pressure returned to baseline and stable Postop Assessment: no apparent nausea or vomiting Anesthetic complications: no   No notable events documented.  Last Vitals:  Vitals:   06/20/22 1030 06/20/22 1045  BP: 133/64   Pulse: 60 60  Resp: (!) 22 (!) 23  Temp:    SpO2: 93% 92%    Last Pain:  Vitals:   06/20/22 1045  TempSrc:   PainSc: 0-No pain                 Lannie Fields

## 2022-06-20 NOTE — Interval H&P Note (Signed)
History and Physical Interval Note:  06/20/2022 8:59 AM  Benjamin Cook  has presented today for surgery, with the diagnosis of right hilar mass.  The various methods of treatment have been discussed with the patient and family. After consideration of risks, benefits and other options for treatment, the patient has consented to  Procedure(s): VIDEO BRONCHOSCOPY WITH ENDOBRONCHIAL ULTRASOUND (N/A) as a surgical intervention.  The patient's history has been reviewed, patient examined, no change in status, stable for surgery.  I have reviewed the patient's chart and labs.  Questions were answered to the patient's satisfaction.     Omar Person

## 2022-06-20 NOTE — Op Note (Signed)
Flexible and EBUS Bronchoscopy Procedure Note  ALBA SPANGLER  [id number]  1945/09/25  Date:06/20/22  Time:10:11 AM   Provider Performing:Shravan Salahuddin M Thora Lance   Procedure: Flexible bronchoscopy and EBUS Bronchoscopy  Indication(s) Right hilar mass  Consent Risks of the procedure as well as the alternatives and risks of each were explained to the patient and/or caregiver.  Consent for the procedure was obtained.  Anesthesia General Anesthesia   Time Out Verified patient identification, verified procedure, site/side was marked, verified correct patient position, special equipment/implants available, medications/allergies/relevant history reviewed, required imaging and test results available.   Sterile Technique Usual hand hygiene, masks, gowns, and gloves were used   Procedure Description Diagnostic bronchoscope advanced through endotracheal tube and into airway.  Airways were examined down to subsegmental level with findings noted below.  The diagnostic bronchoscope was then removed and the EBUS bronchoscope was advanced into airway with right hilar mass biopsied and sent for slide, cell block.  The EBUS bronchoscope was removed after assuring no active bleeding from biopsy site.  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 Chest X-ray is not needed post procedure.   EBL Minimal   Specimen(s) EBNA from right hilar mass sent for cytology

## 2022-06-20 NOTE — Discharge Instructions (Signed)
-   Normal to have cough with small blood clots, bloody streaks for first 1-3 days after bronchoscopy. If coughing up clot size of your thumb or larger then call our office [phone] and if persistent, especially if you're also developing worsening shortness of breath then would plan to head to ED. - OK to resume xarelto day after tomorrow as long as bloody cough subsiding - Fever is common in the first 1-2 days after bronchoscopy and often is simply your body's reaction to saline exposure in your lung - just take tylenol. - Will call you in 3-5 business days when results become available

## 2022-06-23 ENCOUNTER — Encounter (HOSPITAL_COMMUNITY): Payer: Self-pay | Admitting: Student

## 2022-06-25 LAB — CYTOLOGY - NON PAP

## 2022-06-26 ENCOUNTER — Telehealth: Payer: Self-pay | Admitting: Student

## 2022-06-26 NOTE — Telephone Encounter (Signed)
Called and discussed bronchoscopy results, PET/CT already ordered by Dr. Bertis Ruddy and clinic follow up with her has been arranged.

## 2022-06-28 ENCOUNTER — Ambulatory Visit: Payer: Medicare HMO | Attending: Cardiology | Admitting: Cardiology

## 2022-06-28 ENCOUNTER — Encounter: Payer: Self-pay | Admitting: Cardiology

## 2022-06-28 ENCOUNTER — Ambulatory Visit (HOSPITAL_COMMUNITY)
Admission: RE | Admit: 2022-06-28 | Discharge: 2022-06-28 | Disposition: A | Discharge status: Home / Self Care | Payer: Medicare HMO | Source: Ambulatory Visit | Attending: Hematology and Oncology | Admitting: Hematology and Oncology

## 2022-06-28 VITALS — BP 138/68 | HR 83 | Ht 70.0 in | Wt 130.2 lb

## 2022-06-28 DIAGNOSIS — I5042 Chronic combined systolic (congestive) and diastolic (congestive) heart failure: Secondary | ICD-10-CM

## 2022-06-28 DIAGNOSIS — C3490 Malignant neoplasm of unspecified part of unspecified bronchus or lung: Secondary | ICD-10-CM | POA: Insufficient documentation

## 2022-06-28 DIAGNOSIS — C3491 Malignant neoplasm of unspecified part of right bronchus or lung: Secondary | ICD-10-CM | POA: Diagnosis present

## 2022-06-28 DIAGNOSIS — I2510 Atherosclerotic heart disease of native coronary artery without angina pectoris: Secondary | ICD-10-CM

## 2022-06-28 DIAGNOSIS — Z95810 Presence of automatic (implantable) cardiac defibrillator: Secondary | ICD-10-CM

## 2022-06-28 DIAGNOSIS — I472 Ventricular tachycardia, unspecified: Secondary | ICD-10-CM | POA: Diagnosis not present

## 2022-06-28 DIAGNOSIS — I480 Paroxysmal atrial fibrillation: Secondary | ICD-10-CM

## 2022-06-28 LAB — GLUCOSE, CAPILLARY: Glucose-Capillary: 107 mg/dL — ABNORMAL HIGH (ref 70–99)

## 2022-06-28 MED ORDER — FLUDEOXYGLUCOSE F - 18 (FDG) INJECTION
6.2000 | Freq: Once | INTRAVENOUS | Status: AC
  Administered 2022-06-28: 6.4 via INTRAVENOUS

## 2022-06-28 NOTE — Patient Instructions (Signed)
    Follow-Up: At [facility], you and your health needs are our priority.  As part of our continuing mission to provide you with exceptional heart care, we have created designated Provider Care Teams.  These Care Teams include your primary Cardiologist (physician) and Advanced Practice Providers (APPs -  Physician Assistants and Nurse Practitioners) who all work together to provide you with the care you need, when you need it.  We recommend signing up for the patient portal called "MyChart".  Sign up information is provided on this After Visit Summary.  MyChart is used to connect with patients for Virtual Visits (Telemedicine).  Patients are able to view lab/test results, encounter notes, upcoming appointments, etc.  Non-urgent messages can be sent to your provider as well.   To learn more about what you can do with MyChart, go to [email or web address].    Your next appointment:   6 month(s)  Provider:   Brian Crenshaw, MD      

## 2022-07-01 NOTE — Telephone Encounter (Signed)
erroneous

## 2022-07-02 ENCOUNTER — Encounter: Payer: Self-pay | Admitting: Hematology and Oncology

## 2022-07-02 ENCOUNTER — Other Ambulatory Visit: Payer: Self-pay

## 2022-07-02 ENCOUNTER — Inpatient Hospital Stay: Payer: Medicare HMO

## 2022-07-02 ENCOUNTER — Inpatient Hospital Stay: Payer: Medicare HMO | Attending: Hematology and Oncology | Admitting: Hematology and Oncology

## 2022-07-02 ENCOUNTER — Telehealth: Payer: Self-pay | Admitting: Radiation Oncology

## 2022-07-02 DIAGNOSIS — C3491 Malignant neoplasm of unspecified part of right bronchus or lung: Secondary | ICD-10-CM | POA: Diagnosis not present

## 2022-07-02 DIAGNOSIS — I110 Hypertensive heart disease with heart failure: Secondary | ICD-10-CM | POA: Insufficient documentation

## 2022-07-02 DIAGNOSIS — Z7901 Long term (current) use of anticoagulants: Secondary | ICD-10-CM | POA: Insufficient documentation

## 2022-07-02 DIAGNOSIS — C3490 Malignant neoplasm of unspecified part of unspecified bronchus or lung: Secondary | ICD-10-CM | POA: Insufficient documentation

## 2022-07-02 DIAGNOSIS — I5040 Unspecified combined systolic (congestive) and diastolic (congestive) heart failure: Secondary | ICD-10-CM | POA: Insufficient documentation

## 2022-07-02 DIAGNOSIS — Z7951 Long term (current) use of inhaled steroids: Secondary | ICD-10-CM | POA: Insufficient documentation

## 2022-07-02 DIAGNOSIS — R634 Abnormal weight loss: Secondary | ICD-10-CM

## 2022-07-02 DIAGNOSIS — Z79899 Other long term (current) drug therapy: Secondary | ICD-10-CM | POA: Diagnosis not present

## 2022-07-02 DIAGNOSIS — I5022 Chronic systolic (congestive) heart failure: Secondary | ICD-10-CM | POA: Diagnosis not present

## 2022-07-02 DIAGNOSIS — D509 Iron deficiency anemia, unspecified: Secondary | ICD-10-CM | POA: Diagnosis not present

## 2022-07-02 LAB — CBC WITH DIFFERENTIAL (CANCER CENTER ONLY)
Abs Immature Granulocytes: 0.03 10*3/uL (ref 0.00–0.07)
Basophils Absolute: 0.1 10*3/uL (ref 0.0–0.1)
Basophils Relative: 1 %
Eosinophils Absolute: 0.1 10*3/uL (ref 0.0–0.5)
Eosinophils Relative: 1 %
HCT: 34.9 % — ABNORMAL LOW (ref 39.0–52.0)
Hemoglobin: 11.2 g/dL — ABNORMAL LOW (ref 13.0–17.0)
Immature Granulocytes: 0 %
Lymphocytes Relative: 11 %
Lymphs Abs: 1 10*3/uL (ref 0.7–4.0)
MCH: 25.6 pg — ABNORMAL LOW (ref 26.0–34.0)
MCHC: 32.1 g/dL (ref 30.0–36.0)
MCV: 79.7 fL — ABNORMAL LOW (ref 80.0–100.0)
Monocytes Absolute: 1.2 10*3/uL — ABNORMAL HIGH (ref 0.1–1.0)
Monocytes Relative: 15 %
Neutro Abs: 6.1 10*3/uL (ref 1.7–7.7)
Neutrophils Relative %: 72 %
Platelet Count: 361 10*3/uL (ref 150–400)
RBC: 4.38 MIL/uL (ref 4.22–5.81)
RDW: 20.2 % — ABNORMAL HIGH (ref 11.5–15.5)
WBC Count: 8.4 10*3/uL (ref 4.0–10.5)
nRBC: 0 % (ref 0.0–0.2)

## 2022-07-02 LAB — CMP (CANCER CENTER ONLY)
ALT: 13 U/L (ref 0–44)
AST: 18 U/L (ref 15–41)
Albumin: 3.2 g/dL — ABNORMAL LOW (ref 3.5–5.0)
Alkaline Phosphatase: 66 U/L (ref 38–126)
Anion gap: 7 (ref 5–15)
BUN: 10 mg/dL (ref 8–23)
CO2: 23 mmol/L (ref 22–32)
Calcium: 8.7 mg/dL — ABNORMAL LOW (ref 8.9–10.3)
Chloride: 106 mmol/L (ref 98–111)
Creatinine: 0.81 mg/dL (ref 0.61–1.24)
GFR, Estimated: >60 mL/min (ref >60)
Glucose, Bld: 105 mg/dL — ABNORMAL HIGH (ref 70–99)
Potassium: 4.5 mmol/L (ref 3.5–5.1)
Sodium: 136 mmol/L (ref 135–145)
Total Bilirubin: 0.6 mg/dL (ref 0.3–1.2)
Total Protein: 7.5 g/dL (ref 6.5–8.1)

## 2022-07-02 LAB — IRON AND IRON BINDING CAPACITY (CC-WL,HP ONLY)
Iron: 16 ug/dL — ABNORMAL LOW (ref 45–182)
Saturation Ratios: 6 % — ABNORMAL LOW (ref 17.9–39.5)
TIBC: 265 ug/dL (ref 250–450)
UIBC: 249 ug/dL (ref 117–376)

## 2022-07-02 LAB — FERRITIN: Ferritin: 120 ng/mL (ref 24–336)

## 2022-07-02 LAB — ABO/RH: ABO/RH(D): O POS

## 2022-07-02 NOTE — Progress Notes (Signed)
START ON PATHWAY REGIMEN - Non-Small Cell Lung     A cycle is every 7 days, concurrent with RT:     Paclitaxel      Carboplatin   **Always confirm dose/schedule in your pharmacy ordering system**  Patient Characteristics: Preoperative or Nonsurgical Candidate (Clinical Staging), Stage III - Nonsurgical Candidate (Nonsquamous and Squamous), PS = 0, 1 Therapeutic Status: Preoperative or Nonsurgical Candidate (Clinical Staging) AJCC T Category: cT2 AJCC N Category: cN2 AJCC M Category: cM0 AJCC 8 Stage Grouping: IIIA ECOG Performance Status: 1 Intent of Therapy: Curative Intent, Discussed with Patient

## 2022-07-02 NOTE — Assessment & Plan Note (Signed)
He is noted to have significant protein calorie malnutrition with profound weight loss Recommend frequent small meals and high-protein diet

## 2022-07-02 NOTE — Assessment & Plan Note (Signed)
I gave the patient and his wife copies of PET/CT imaging and pathology report The patient has stage III disease He is not a surgical candidate due to significant comorbidities I explained to the patient why surgery is not recommended We discussed the role of concurrent chemoradiation therapy There is insufficient tissue to add molecular testing I recommend foundation One testing We discussed the role of concurrent chemoradiation therapy I recommend port placement Depending on the timing of start date of radiation treatment, I will bring the patient back I would recommend concurrent chemoradiation therapy with weekly carboplatin and paclitaxel, followed by future consolidation treatment with durvalumab

## 2022-07-02 NOTE — Progress Notes (Signed)
[facility] OFFICE PROGRESS NOTE  Patient Care Team: Benjamin Hack, MD as PCP - General (Family Medicine) Benjamin Som Madolyn Frieze, MD as PCP - Cardiology (Cardiology) Benjamin Maw, [name] We discussed the role of concurrent chemoradiation therapy There is insufficient tissue to add molecular testing I recommend foundation One testing We discussed the role of concurrent chemoradiation therapy I recommend port placement Depending on the timing of start date of radiation treatment, I will bring the patient back I would recommend concurrent chemoradiation therapy with weekly carboplatin and paclitaxel, followed by future consolidation treatment with durvalumab  WEIGHT LOSS He is noted to have significant protein calorie malnutrition with profound weight loss Recommend frequent small meals and high-protein diet  Anemia, iron deficiency He was given recent IV iron supplement in the hospital I plan to recheck his iron studies  Chronic systolic CHF (congestive heart failure) ([name]) His recent echocardiogram shows significant cardiomyopathy He will continue medical management Today, he does not have signs or symptoms of congestive heart failure  Orders Placed This Encounter  Procedures   IR IMAGING GUIDED PORT INSERTION    Standing Status:   Future    Standing Expiration Date:   07/02/2023    Order Specific Question:   Reason for Exam (SYMPTOM  OR DIAGNOSIS REQUIRED)    Answer:   need port for chemo to start in 10 days    Order Specific Question:   Preferred Imaging Location?    Answer:   [facility]   CBC with Differential (Cancer Center  Only)    Standing Status:   Future    Number of Occurrences:   1    Standing Expiration Date:   07/02/2023   Iron and Iron Binding Capacity ([facility] only)    Standing Status:   Future    Number of Occurrences:   1    Standing Expiration Date:   07/02/2023   Ferritin    Standing Status:   Future    Number of Occurrences:   1    Standing Expiration Date:   07/02/2023   CMP (Cancer Center only)    Standing Status:   Future    Number of Occurrences:   1    Standing Expiration Date:   07/02/2023   FoundationAct    Standing Status:   Future    Number of Occurrences:   1    Standing Expiration Date:   07/02/2023   CBC with Differential (Cancer Center Only)    Standing Status:   Future    Standing Expiration Date:   07/16/2023   CMP (Cancer Center only)    Standing Status:   Future    Standing Expiration Date:   07/16/2023   CBC with Differential (Cancer Center Only)    Standing Status:   Future    Standing Expiration Date:   07/23/2023   CMP (Cancer Center only)    Standing Status:   Future    Standing Expiration Date:   07/23/2023   CBC with Differential (Cancer Center Only)    Standing Status:   Future    Standing Expiration Date:   07/30/2023   [facility]    Standing Status:   Future    Standing Expiration Date:   07/30/2023   CBC with Differential (Cancer Center Only)    Standing Status:   Future    Standing Expiration Date:   08/06/2023   CMP (Cancer Center only)    Standing Status:   Future    Standing Expiration Date:   08/06/2023   CBC with Differential (Cancer Center Only)    Standing Status:   Future    Standing Expiration Date:   08/13/2023   CMP (Cancer Center only)    Standing Status:   Future    Standing Expiration Date:   08/13/2023   CBC with Differential (Cancer Center Only)    Standing Status:   Future    Standing Expiration Date:   08/20/2023   CMP (Cancer Center only)    Standing Status:   Future    Standing Expiration Date:   08/20/2023   Ambulatory  referral to Radiation Oncology    Referral Priority:   Routine    Referral Type:   Consultation    Referral Reason:   Specialty Services Required    Requested Specialty:   Radiation Oncology    Number of Visits Requested:   1   ABO/Rh    Standing Status:   Future    Number of Occurrences:   1    Standing Expiration Date:   07/02/2023    All questions were answered. The patient knows to call the clinic with any problems, questions or concerns. The total time spent in the appointment was 40 minutes encounter with patients including review of chart and various tests results, discussions about plan of care and coordination of care plan   Benjamin Delay, MD 07/02/2022 2:10 PM  INTERVAL HISTORY: Please see below for problem oriented charting. he returns for hospital follow-up He was seen in the hospital initially on April 30 after presentation to the hospital and was noted to be anemic, along with abnormal CT imaging He was subsequently discharged from the hospital, underwent bronchoscopy and biopsy as well as PET/CT imaging He is here accompanied by his wife He felt weak since discharge from the hospital Denies recent cough, chest pain or shortness of breath We spent majority of our time reviewing test results today  REVIEW OF SYSTEMS:   Constitutional: Denies fevers, chills or abnormal weight loss Eyes: Denies blurriness of vision Ears, nose, mouth, throat, and face: Denies mucositis or sore throat Respiratory: Denies cough, dyspnea or wheezes Cardiovascular: Denies palpitation, chest discomfort or lower extremity swelling Gastrointestinal:  Denies nausea, heartburn or change in bowel habits Skin: Denies abnormal skin rashes Lymphatics: Denies new lymphadenopathy or easy bruising Neurological:Denies numbness, tingling or new weaknesses Behavioral/Psych: Mood is stable, no new changes  All other systems were reviewed with the patient and are negative.  I have reviewed the past medical  history, past surgical history, social history and family history with the patient and they are unchanged from previous note.  ALLERGIES:  has No Known Allergies.  MEDICATIONS:  Current Outpatient Medications  Medication Sig Dispense Refill   acetaminophen (TYLENOL) 500 MG tablet Take 500-1,000 mg by mouth every 8 (eight) hours as needed (for pain).      amiodarone (PACERONE) 400 MG tablet Take 0.5 tablets (200 mg total) by mouth 2 (two) times daily. 28 tablet 0   atorvastatin (LIPITOR) 80 MG tablet TAKE 1 TABLET BY MOUTH DAILY AT 6 PM (Patient taking differently: Take 80 mg by mouth  daily. TAKE 1 TABLET BY MOUTH  DAILY AT 6 PM) 90 tablet 3   isosorbide mononitrate (IMDUR) 30 MG 24 hr tablet TAKE 1/2 OF A TABLET (15 MG TOTAL) BY MOUTH DAILY (Patient taking differently: Take 30 mg by mouth daily.) 45 tablet 3   melatonin 5 MG TABS Take 1 tablet (5 mg total) by mouth at bedtime. 30 tablet 0   metoprolol succinate (TOPROL-XL) 50 MG 24 hr tablet Take 1 tablet (50 mg total) by mouth 2 (two) times daily. Take with or immediately following a meal. 30 tablet 3   mometasone-formoterol (DULERA) 100-5 MCG/ACT AERO Inhale 2 puffs into the lungs in the morning and at bedtime. 8.8 each 1   Multiple Vitamin (MULTIVITAMIN) capsule Take 1 capsule by mouth daily.     pantoprazole (PROTONIX) 40 MG tablet Take 40 mg by mouth daily.     potassium chloride SA (KLOR-CON M20) 20 MEQ tablet TAKE 1 TABLET (20 MEQ TOTAL) BY MOUTH DAILY. PLEASE SCHEDULE APPOINTMENT FOR ADDITIONAL REFILLS. (Patient taking differently: Take 20 mEq by mouth daily.) 90 tablet 2   rivaroxaban (XARELTO) 20 MG TABS tablet Take 1 tablet (20 mg total) by mouth daily with supper. 30 tablet 3   sacubitril-valsartan (ENTRESTO) 97-103 MG TAKE 1 TABLET BY MOUTH TWICE A DAY (Patient taking differently: Take 1 tablet by mouth 2 (two) times daily.) 180 tablet 3   spironolactone (ALDACTONE) 25 MG tablet Take 1 tablet (25 mg total) by mouth daily. 30 tablet 3    No current facility-administered medications for this visit.    SUMMARY OF ONCOLOGIC HISTORY: Oncology History  Non-small cell lung cancer ([name])  06/11/2022 Initial Diagnosis   Non-small cell lung cancer ([name])   06/20/2022 Pathology Results   CYTOLOGY - NON PAP  CASE: [id number]  PATIENT: Benjamin Cook  Non-Gynecological Cytology Report   Clinical History: Right hilar mass  Specimen Submitted:  A. LUNG, RIGHT HILAR MASS, FINE NEEDLE ASPIRATION:    FINAL MICROSCOPIC DIAGNOSIS:  - Malignant cells consistent with non-small cell carcinoma  - See comment   SPECIMEN ADEQUACY:  Satisfactory for evaluation   DIAGNOSTIC COMMENTS:  The specimen contains atypical cells with malignant appearance characterized by nuclear enlargement, pleomorphism, clumped chromatin and occasional nucleoli. Immunohistochemical stains were attempted however no cells were present on the stains. The morphology does not support a small cell carcinoma with differential diagnosis including adenocarcinoma, large cell neuroendocrine carcinoma, or large cell carcinoma, et al. More diagnostic material is needed for further classification and molecular tests.    07/02/2022 PET scan   1. Right suprahilar hypermetabolic mass, consistent with primary lung malignancy. 2. Subcarinal hypermetabolic activity which is contiguous with right hilar mass, possibly due to mediastinal extension or subcarinal lymphadenopathy. 3. No evidence of metastatic disease in the abdomen or pelvis. 4. Right adrenal gland mass with low-level FDG uptake, unchanged when compared with prior exams dating back to 2018 and consistent with benign adenoma. 5. Hypermetabolic right paraspinal consolidation, likely infectious or inflammatory. 6. Aortic Atherosclerosis (ICD10-I70.0) and Emphysema (ICD10-J43.9).     07/02/2022 Cancer Staging   Staging form: Lung, AJCC 8th Edition - Clinical stage from 07/02/2022: Stage IIIA (cT4, cN0, cM0) - Signed by  Benjamin Delay, MD on 07/02/2022 Stage prefix: Initial diagnosis     PHYSICAL EXAMINATION: ECOG PERFORMANCE STATUS: 1 - Symptomatic but completely ambulatory GENERAL:alert, no distress and comfortable NEURO: alert & oriented x 3 with fluent speech, no focal motor/sensory deficits  LABORATORY DATA:  I have reviewed the data as listed  Component Value Date/Time   NA 133 (L) 06/20/2022 0708   NA 138 04/15/2022 0913   K 4.8 06/20/2022 0708   CL 104 06/20/2022 0708   CO2 21 (L) 06/20/2022 0708   GLUCOSE 97 06/20/2022 0708   BUN 9 06/20/2022 0708   BUN 7 (L) 04/15/2022 0913   CREATININE 0.87 06/20/2022 0708   CREATININE 0.88 09/15/2015 0959   CALCIUM 8.5 (L) 06/20/2022 0708   PROT 6.6 06/03/2022 0700   PROT 6.8 11/07/2020 1303   ALBUMIN 2.2 (L) 06/03/2022 0700   ALBUMIN 3.6 (L) 11/07/2020 1303   AST 20 06/03/2022 0700   ALT 14 06/03/2022 0700   ALKPHOS 51 06/03/2022 0700   BILITOT 0.5 06/03/2022 0700   BILITOT 0.3 11/07/2020 1303   GFRNONAA >60 06/20/2022 0708   GFRAA 96 03/17/2020 1016    No results found for: "SPEP", "UPEP"  Lab Results  Component Value Date   WBC 9.0 06/20/2022   NEUTROABS 4.8 06/03/2022   HGB 10.3 (L) 06/20/2022   HCT 33.8 (L) 06/20/2022   MCV 80.3 06/20/2022   PLT 323 06/20/2022      Chemistry      Component Value Date/Time   NA 133 (L) 06/20/2022 0708   NA 138 04/15/2022 0913   K 4.8 06/20/2022 0708   CL 104 06/20/2022 0708   CO2 21 (L) 06/20/2022 0708   BUN 9 06/20/2022 0708   BUN 7 (L) 04/15/2022 0913   CREATININE 0.87 06/20/2022 0708   CREATININE 0.88 09/15/2015 0959      Component Value Date/Time   CALCIUM 8.5 (L) 06/20/2022 0708   ALKPHOS 51 06/03/2022 0700   AST 20 06/03/2022 0700   ALT 14 06/03/2022 0700   BILITOT 0.5 06/03/2022 0700   BILITOT 0.3 11/07/2020 1303       RADIOGRAPHIC STUDIES: I have reviewed imaging study with the patient and his wife I have personally reviewed the radiological images as listed and agreed  with the findings in the report. NM PET Image Initial (PI) Skull Base To Thigh  Result Date: 07/02/2022 CLINICAL DATA:  Initial treatment strategy for lung cancer. EXAM: NUCLEAR MEDICINE PET SKULL BASE TO THIGH TECHNIQUE: 6.5 mCi F-18 FDG was injected intravenously. Full-ring PET imaging was performed from the skull base to thigh after the radiotracer. CT data was obtained and used for attenuation correction and anatomic localization. Fasting blood glucose: 107 mg/dl COMPARISON:  Chest CT dated June 03, 2022; CT abdomen and pelvis dated November 15, 2016 FINDINGS: Mediastinal blood pool activity: SUV max 2.0 Liver activity: SUV max NA NECK: No hypermetabolic lymph nodes in the neck. Incidental CT findings: None. CHEST: Large right suprahilar hypermetabolic mass, SUV max of 23.4. Subcarinal hypermetabolic activity which is contiguous with right hilar mass seen on series 604, image 72, possibly due to mediastinal extension or subcarinal lymphadenopathy. No other hypermetabolic lymph nodes seen in the chest. Hypermetabolic right paraspinal consolidation, likely infectious or inflammatory. Incidental CT findings: Moderate centrilobular emphysema. Bilateral bronchial wall thickening and scattered areas of mucous plugging. ABDOMEN/PELVIS: No abnormal hypermetabolic activity within the liver, pancreas, adrenal glands, or spleen. No hypermetabolic lymph nodes in the abdomen or pelvis. Incidental CT findings: Atherosclerotic disease of the abdominal aorta. Abdominal aortic aneurysm status post endovascular repair. Right adrenal gland mass measuring up to 3.5 cm with low-level FDG uptake. Prostatomegaly. SKELETON: No focal hypermetabolic activity to suggest skeletal metastasis. Incidental CT findings: None. IMPRESSION: 1. Right suprahilar hypermetabolic mass, consistent with primary lung malignancy. 2. Subcarinal hypermetabolic activity which  is contiguous with right hilar mass, possibly due to mediastinal extension or  subcarinal lymphadenopathy. 3. No evidence of metastatic disease in the abdomen or pelvis. 4. Right adrenal gland mass with low-level FDG uptake, unchanged when compared with prior exams dating back to 2018 and consistent with benign adenoma. 5. Hypermetabolic right paraspinal consolidation, likely infectious or inflammatory. 6. Aortic Atherosclerosis (ICD10-I70.0) and Emphysema (ICD10-J43.9). Electronically Signed   By: Allegra Lai M.D.   On: 07/02/2022 10:46   DG Chest Port 1 View  Result Date: 06/24/2022 CLINICAL DATA:  77 year old male with congestive heart failure EXAM: PORTABLE CHEST 1 VIEW COMPARISON:  06/02/2022 FINDINGS: Cardiac diameter unchanged. Left chest wall pacing device/AICD, unchanged. Opacity in the right hilar region, compatible with known malignancy. No pneumothorax.  No pleural effusion. No interlobular septal thickening. Coarsened interstitial markings similar to the prior. Peripheral nodular changes of the right upper lung similar to the comparison and better characterized on recent chest CT imaging. No new confluent airspace disease. IMPRESSION: Negative for acute cardiopulmonary disease. Right hilar opacity, compatible with known malignancy. Electronically Signed   By: Gilmer Mor D.O.   On: 06/24/2022 09:15   US AORTA DUPLEX LIMITED  Result Date: 06/06/2022 CLINICAL DATA:  Abdominal aortic aneurysm. EXAM: ULTRASOUND OF ABDOMINAL AORTA TECHNIQUE: Ultrasound examination of the abdominal aorta and proximal common iliac arteries was performed to evaluate for aneurysm. Additional color and Doppler images of the distal aorta were obtained to document patency. COMPARISON:  October 14, 2020. FINDINGS: Abdominal aortic measurements as follows: Proximal:  2.3 cm Mid:  2.4 cm Distal:  6.1 cm Patent: Yes, peak systolic velocity is 61 cm/s Status post endograft placement. Right common iliac artery: 1.4 cm Left common iliac artery: 1.5 cm IMPRESSION: Status post abdominal aortic  endograft placement. Distal portion of occluded aneurysmal sac measures 6.1 cm. Endoleak cannot be excluded on the basis of this exam. If there is clinical concern for endoleak, CT angiography is recommended for further evaluation. Electronically Signed   By: Lupita Raider M.D.   On: 06/06/2022 15:02   CT ANGIO HEAD NECK W WO CM  Result Date: 06/05/2022 CLINICAL DATA:  Lung cancer staging EXAM: CT ANGIOGRAPHY HEAD AND NECK WITH AND WITHOUT CONTRAST TECHNIQUE: Multidetector CT imaging of the head and neck was performed using the standard protocol during bolus administration of intravenous contrast. Multiplanar CT image reconstructions and MIPs were obtained to evaluate the vascular anatomy. Carotid stenosis measurements (when applicable) are obtained utilizing NASCET criteria, using the distal internal carotid diameter as the denominator. RADIATION DOSE REDUCTION: This exam was performed according to the departmental dose-optimization program which includes automated exposure control, adjustment of the mA and/or kV according to patient size and/or use of iterative reconstruction technique. CONTRAST:  75mL OMNIPAQUE IOHEXOL 350 MG/ML SOLN COMPARISON:  CT CHest 06/03/22, CTA chest 10/13/21, CT head 03/30/21, CTA head/neck 03/30/21 FINDINGS: CT HEAD FINDINGS Brain: Unchanged hyperdensities in the medial aspect of bilateral occipital lobes. No CT evidence of an acute cortical infarct. There is redemonstration of slight prominence of the extra-axial space along the right frontal lobe, which could represent a small chronic subdural hematoma or a central hygroma. No hemorrhage. Vascular: See below Skull: Normal. Negative for fracture or focal lesion. Sinuses/Orbits: No middle ear mastoid effusion. Paranasal sinuses noted focal 4 trace mucosal thickening in the posterior ethmoid air cells on the right. Bilateral lens replacement orbits are otherwise unremarkable. Other: None Review of the MIP images confirms the above  findings CTA NECK FINDINGS Aortic  arch: Standard branching. Imaged portion shows no evidence of aneurysm or dissection. No significant stenosis of the major arch vessel origins. Mild atherosclerotic plaque in the proximal aspect of the left subclavian artery. Right carotid system: No evidence of dissection, stenosis (50% or greater), or occlusion. Left carotid system: No evidence of dissection, stenosis (50% or greater), or occlusion. Vertebral arteries: Is likely moderate to severe multifocal stenosis in the V1 segment of the right vertebral artery. Origin of the left vertebral artery is not assessed due to a combination of streak and motion artifact. There is calcified atherosclerotic plaque in the bilateral vertebral arteries. Skeleton: Unchanged sclerotic appearance of the vertebral bodies in the lower cervical spinal C5-C7. Similar findings are seen at the C2 level and the posterior elements of C3 on C4. Other neck: Patient is edentulous. Upper chest: Severe centrilobular emphysema. See prior CT chest dated 06/03/2022 for additional intrathoracic findings. Review of the MIP images confirms the above findings CTA HEAD FINDINGS Anterior circulation: No significant proximal occlusion, aneurysm, or vascular malformation. There is mild-to-moderate narrowing of the bifurcation of the right MCA. Posterior circulation: No significant stenosis, proximal occlusion, aneurysm, or vascular malformation. Venous sinuses: As permitted by contrast timing, patent. Anatomic variants: Review of the MIP images confirms the above findings IMPRESSION: 1. Combination of CT technique and arterial timing of the contras bolus significantly limits and essentially precludes assessment for intracranial metastatic disease. Within this limitation, no contrast enhancing lesion is visualized. 2. No CT evidence of an acute cortical infarct. 3. No significant proximal occlusion, aneurysm, or vascular malformation in the intracranial circulation.  Mild-to-moderate narrowing of the bifurcation of the right MCA. 4. Likely moderate to severe multifocal stenosis in the V1 segment of the right vertebral artery. Origin of the left vertebral artery is not assessed due to a combination of streak and motion artifact. 5. Severe centrilobular emphysema. See prior CT chest dated 06/03/2022 for additional intrathoracic findings. 6. Unchanged appearance of the cervical spine vertebral bodies compared to 03/30/21. Emphysema (ICD10-J43.9). Electronically Signed   By: Lorenza Cambridge M.D.   On: 06/05/2022 15:33   CUP PACEART REMOTE DEVICE CHECK  Result Date: 06/03/2022 Billable [employer] remote reviewed. Normal device function.  6 AMS for <1%, <1 min.  Intermittent RA lead noise. 1 RA noise reversion due to RA lead noise 57 SVT classified event, of those listed, longest 1 min 48 sec with V-rates 120's.  Few EGMS. Frequent bigeminy PVC couplets. 32 NSVT clasified events, longest 26 sec, V rates 130s.  One EGMs to view with frequent PVC couplets and triplets. 3 VT-1 events, all on 4/28.  Longset 3 min 2 sec with V-rates 120s.  Total of 28 sequence of ATP delivered.  No EGMs to view.  To triage. Next remote 91 days. [place]ag[place], CVRS.  ECHOCARDIOGRAM COMPLETE  Result Date: 06/03/2022    ECHOCARDIOGRAM REPORT   Patient Name:   Benjamin Cook Date of Exam: 06/03/2022 Medical Rec #:  [id number]         Height:       71.0 in Accession #:    [id number]        Weight:       145.0 lb Date of Birth:  04/29/1945         BSA:          1.839 m Patient Age:    76 years          BP:  125/57 mmHg Patient Gender: M                 HR:           84 bpm. Exam Location:  Inpatient Procedure: 2D Echo, Cardiac Doppler, Color Doppler and Intracardiac            Opacification Agent Indications:    Ventricular Tachycardia I47.2  History:        Patient has prior history of Echocardiogram examinations, most                 recent 10/11/2020. Cardiomyopathy and CHF, CAD, COPD,                  Arrythmias:Tachycardia and Atrial Fibrillation,                 Signs/Symptoms:Dyspnea and Shortness of Breath; Risk                 Factors:Hypertension, Dyslipidemia and Current Smoker.  Sonographer:    [name] Referring Phys: [id number] MICHAEL ANDREW TILLERY IMPRESSIONS  1. Global hypokinesis with akinesis of the septum and aneurysm formation of apex;overall severe LV dysfunction.  2. Left ventricular ejection fraction, by estimation, is 20 to 25%. The left ventricle has severely decreased function. The left ventricle demonstrates regional wall motion abnormalities (see scoring diagram/findings for description). The left ventricular internal cavity size was mildly dilated. Left ventricular diastolic parameters are consistent with Grade I diastolic dysfunction (impaired relaxation).  3. Right ventricular systolic function is normal. The right ventricular size is normal.  4. The mitral valve is normal in structure. No evidence of mitral valve regurgitation. No evidence of mitral stenosis.  5. The aortic valve is tricuspid. Aortic valve regurgitation is not visualized. Aortic valve sclerosis is present, with no evidence of aortic valve stenosis.  6. The inferior vena cava is normal in size with greater than 50% respiratory variability, suggesting right atrial pressure of 3 mmHg. FINDINGS  Left Ventricle: Left ventricular ejection fraction, by estimation, is 20 to 25%. The left ventricle has severely decreased function. The left ventricle demonstrates regional wall motion abnormalities. Definity contrast agent was given IV to delineate the left ventricular endocardial borders. The left ventricular internal cavity size was mildly dilated. There is no left ventricular hypertrophy. Left ventricular diastolic parameters are consistent with Grade I diastolic dysfunction (impaired relaxation). Right Ventricle: The right ventricular size is normal. Right ventricular systolic function is normal. Left Atrium: Left  atrial size was normal in size. Right Atrium: Right atrial size was normal in size. Pericardium: Trivial pericardial effusion is present. Mitral Valve: The mitral valve is normal in structure. Mild mitral annular calcification. No evidence of mitral valve regurgitation. No evidence of mitral valve stenosis. Tricuspid Valve: The tricuspid valve is normal in structure. Tricuspid valve regurgitation is trivial. No evidence of tricuspid stenosis. Aortic Valve: The aortic valve is tricuspid. Aortic valve regurgitation is not visualized. Aortic valve sclerosis is present, with no evidence of aortic valve stenosis. Aortic valve peak gradient measures 4.1 mmHg. Pulmonic Valve: The pulmonic valve was normal in structure. Pulmonic valve regurgitation is not visualized. No evidence of pulmonic stenosis. Aorta: The aortic root is normal in size and structure. Venous: The inferior vena cava is normal in size with greater than 50% respiratory variability, suggesting right atrial pressure of 3 mmHg. IAS/Shunts: No atrial level shunt detected by color flow Doppler. Additional Comments: Global hypokinesis with akinesis of the septum and aneurysm formation of apex;overall severe  LV dysfunction. A device lead is visualized.  LEFT VENTRICLE PLAX 2D LVIDd:         5.90 cm      Diastology LVIDs:         4.80 cm      LV e' medial:   5.44 cm/s LV PW:         1.00 cm      LV E/e' medial: 8.1 LV IVS:        0.80 cm LVOT diam:     1.80 cm LV SV:         39 LV SV Index:   21 LVOT Area:     2.54 cm  LV Volumes (MOD) LV vol d, MOD A2C: 153.5 ml LV vol d, MOD A4C: 207.0 ml LV vol s, MOD A2C: 109.8 ml LV vol s, MOD A4C: 139.0 ml LV SV MOD A2C:     43.8 ml LV SV MOD A4C:     207.0 ml LV SV MOD BP:      53.6 ml RIGHT VENTRICLE             IVC RV Basal diam:  3.10 cm     IVC diam: 0.90 cm RV S prime:     11.20 cm/s TAPSE (M-mode): 1.3 cm LEFT ATRIUM             Index        RIGHT ATRIUM           Index LA diam:        3.75 cm 2.04 cm/m   RA Area:      14.20 cm LA Vol (A2C):   34.7 ml 18.87 ml/m  RA Volume:   38.00 ml  20.66 ml/m LA Vol (A4C):   29.4 ml 15.99 ml/m LA Biplane Vol: 34.2 ml 18.59 ml/m  AORTIC VALVE AV Area (Vmax): 2.08 cm AV Vmax:        101.00 cm/s AV Peak Grad:   4.1 mmHg LVOT Vmax:      82.43 cm/s LVOT Vmean:     50.800 cm/s LVOT VTI:       0.153 m  AORTA Ao Root diam: 2.90 cm Ao Asc diam:  3.00 cm MITRAL VALVE               TRICUSPID VALVE MV Area (PHT): 4.39 cm    TR Peak grad:   14.9 mmHg MV Decel Time: 173 msec    TR Vmax:        193.00 cm/s MV E velocity: 44.10 cm/s MV A velocity: 86.30 cm/s  SHUNTS MV E/A ratio:  0.51        Systemic VTI:  0.15 m                            Systemic Diam: 1.80 cm Olga Millers MD Electronically signed by Olga Millers MD Signature Date/Time: 06/03/2022/2:58:23 PM    Final    CT Chest W Contrast  Result Date: 06/03/2022 CLINICAL DATA:  Pleural effusion, malignancy suspected. EXAM: CT CHEST WITH CONTRAST TECHNIQUE: Multidetector CT imaging of the chest was performed during intravenous contrast administration. RADIATION DOSE REDUCTION: This exam was performed according to the departmental dose-optimization program which includes automated exposure control, adjustment of the mA and/or kV according to patient size and/or use of iterative reconstruction technique. CONTRAST:  50mL OMNIPAQUE IOHEXOL 350 MG/ML SOLN COMPARISON:  10/13/2021. FINDINGS: Cardiovascular: The heart is enlarged and there  is a small pericardial effusion. Pacemaker leads are noted in the heart. Three-vessel coronary artery calcifications are noted. There is atherosclerotic calcification of the aorta without evidence of aneurysm. The pulmonary trunk is normal in caliber. Mediastinum/Nodes: Prominent lymph node is present in the subcarinal space measuring 1 cm. No hilar or axillary lymphadenopathy is seen. The thyroid gland, trachea, and esophagus are within normal limits. There is a small hiatal hernia. Lungs/Pleura: Paraseptal and  centrilobular emphysematous changes are present in the lungs. Debris is present in the left mainstem bronchus. There is bronchial wall thickening bilaterally with multifocal mucous plugging in the bilateral lower lobes, greater on the left than on the right. Atelectasis is noted at the lung bases. There are trace bilateral pleural effusions. Trace bilateral pleural effusions are noted. No pneumothorax. There is redemonstration of a mass in the suprahilar region on the right measuring 4.1 x 4.1 cm, increased in size from the prior exam. Pleural and parenchymal scarring is noted bilaterally. Upper Abdomen: There is a 4.1 cm mass in the right adrenal gland containing calcifications, unchanged from 2009. Cysts are present in the right kidney. Subcentimeter hypodensities are present in the left kidney which are too small to further characterize. An aortic endograft is noted in the upper abdomen. No acute abnormality. Musculoskeletal: A pacemaker device is present in the anterior chest wall on the left. Degenerative changes are present in the thoracic spine. No acute or suspicious osseous abnormality. IMPRESSION: 1. Interval enlargement of suprahilar mass on the right now measuring 4.1 x 4.1 cm, suspicious for neoplasm. PET-CT or biopsy is recommended for further evaluation. 2. Bilateral bronchial wall thickening with multifocal mucous plugging in the lower lobes bilaterally, greater on the left than on the right. 3. Emphysema. 4. Aortic atherosclerosis and coronary artery calcifications. Electronically Signed   By: Thornell Sartorius M.D.   On: 06/03/2022 01:28   DG Chest 2 View  Result Date: 06/02/2022 CLINICAL DATA:  Chest pain EXAM: CHEST - 2 VIEW COMPARISON:  01/22/2022 FINDINGS: Cardiac shadow is within normal limits. Defibrillator is again noted. Aortic calcifications are seen. Mild scarring is noted in the right apex stable from the prior exam. Fullness in the right hilar region is noted which corresponds to a soft  tissue density seen on prior CT examination from 10/13/2021. This may represent enlarging right hilar mass. No bony abnormality is noted. No other focal abnormality is seen. IMPRESSION: Prominent right hilum with focal mass lesion on the lateral projection similar to that seen on prior CT examination. Again this is suspicious for neoplasm. Follow-up CT is recommended for further evaluation. Electronically Signed   By: Alcide Clever M.D.   On: 06/02/2022 23:32

## 2022-07-02 NOTE — Assessment & Plan Note (Signed)
His recent echocardiogram shows significant cardiomyopathy He will continue medical management Today, he does not have signs or symptoms of congestive heart failure

## 2022-07-02 NOTE — Assessment & Plan Note (Signed)
He was given recent IV iron supplement in the hospital I plan to recheck his iron studies

## 2022-07-03 ENCOUNTER — Ambulatory Visit
Admission: RE | Admit: 2022-07-03 | Discharge: 2022-07-03 | Disposition: A | Discharge status: Home / Self Care | Payer: Medicare HMO | Source: Ambulatory Visit | Attending: Radiation Oncology | Admitting: Radiation Oncology

## 2022-07-03 ENCOUNTER — Encounter: Payer: Self-pay | Admitting: Cardiovascular Disease

## 2022-07-03 ENCOUNTER — Ambulatory Visit: Payer: Medicare HMO | Admitting: Radiation Oncology

## 2022-07-03 ENCOUNTER — Ambulatory Visit: Payer: Medicare HMO

## 2022-07-03 ENCOUNTER — Encounter: Payer: Self-pay | Admitting: Radiation Oncology

## 2022-07-03 VITALS — Ht 70.0 in | Wt 130.0 lb

## 2022-07-03 DIAGNOSIS — C3480 Malignant neoplasm of overlapping sites of unspecified bronchus and lung: Secondary | ICD-10-CM

## 2022-07-03 DIAGNOSIS — C3491 Malignant neoplasm of unspecified part of right bronchus or lung: Secondary | ICD-10-CM

## 2022-07-03 NOTE — Progress Notes (Addendum)
Nursing interview via telephone for a 77 yr old male w/ Non-small cell cancer of right lung ([name]). Patient identity verified x2.  Patient reports doing well. Denies any discomfort at this time.  Meaningful use complete.  Ht 5\' 10"  (1.778 m)   Wt 130 lb (59 kg)   BMI 18.65 kg/m   Patient contact [phone]  This concludes the interview.   Ruel Favors, LPN

## 2022-07-03 NOTE — Progress Notes (Signed)
TO BE COMPLETED BY RADIATION ONCOLOGIST OFFICE:   Patient Name: Benjamin Cook   Date of Birth: 04/15/1945   Radiation Oncologist: Dr. Mitzi Hansen   Site to be Treated: Right Lung   Will x-rays >10 MV be used? No   Will the radiation be >10 cm from the device? Yes   Planned Treatment Start Date: TBD   TO BE COMPLETED BY CARDIOLOGIST OFFICE:   Device Information:  Pacemaker [x]      ICD [x]    Brand: [employer]: [phone] Model #: Monika Salk DR [phone]  Serial Number: [id number]     Date of Placement: 03/18/2014  Site of Placement: L.Chest  Remote Device Check--Frequency: 91 days   Last Check: 06/19/22  Is the Patient Pacer Dependent?:  Yes []   No [x]   Does cardiologist request Radiation Oncology to schedule device testing by vendor for the following:  Prior to the Initiation of Treatments?  Yes []  No [x]  During Treatments?  Yes []  No [x]  Post Radiation Treatments?  Yes []  No [x]   Is device monitoring necessary by vendor/cardiologist team during treatments?  Yes []   No [x]   Is cardiac monitoring by Radiation Oncology nursing necessary during treatments? Yes [x]   No []   Do you recommend device be relocated prior to Radiation Treatment? Yes []   No [x]   **PLEASE LIST ANY NOTES OR SPECIAL REQUESTS:       CARDIOLOGIST SIGNATURE:  Dr. Nelly Laurence Per Device Clinic Standing Orders, Dorathy Daft  07/03/2022 3:32 PM  **Please route completed form back to Radiation Oncology Nursing and "P [facility] RAD ONC ADMIN", OR send an update if there will be a delay in having form completed by expected start date.  **Call [phone] if you have any questions or do not get an in-basket response from a Radiation Oncology staff member

## 2022-07-03 NOTE — Progress Notes (Signed)
Radiation Oncology         [phone] ________________________________  Initial Outpatient Consultation - Conducted via telephone at patient request.  I spoke with the patient to conduct this consult visit via telephone. The patient was notified in advance and was offered an in person or telemedicine meeting to allow for face to face communication but instead preferred to proceed with a telephone consult.    Name: Benjamin Cook        MRN: [id number]  Date of Service: 07/03/2022 DOB: [birth year]  HQ:IONG, Melanee Spry, MD  Artis Delay, MD     REFERRING PHYSICIAN: Artis Delay, MD   DIAGNOSIS: The primary encounter diagnosis was Non-small cell cancer of right lung [facility]). A diagnosis of Malignant neoplasm of overlapping sites of unspecified bronchus and lung ([name]) was also pertinent to this visit.   HISTORY OF PRESENT ILLNESS: Benjamin Cook is a 77 y.o. male seen at the request of Dr. Bertis Ruddy for a diagnosis of Stage IIIC, cT2N2M0, NSCLC, involving a suprahilar mass in the right lung. The patient has a history of cardiovascular disease and had been hospitalized in September 2023 after an episode of syncope.  While he was worked up at that time he was found to have a 2 cm nodule in the right upper lobe and it was recommended that he have this followed in 3 months time.  It did not appear that this was followed and he presented on 06/03/2022 complaining of chest pain and by chest x-ray a prominent lesion in the right hilum was appreciated.  By CT chest with contrast on 06/03/2022, the lesion had increased in size to 4.1 cm with bilateral bronchial wall thickening and right multifocal mucous plugging as well as emphysema and evidence of atherosclerotic disease.  An MRI could not be performed given his pacemaker so a CT head was performed with and without contrast on 06/05/2022 which did not show any evidence of brain metastases.  He underwent a PET scan on 06/28/2022 which showed hypermetabolic  activity within the right suprahilar mass and subcarinal adenopathy that was also hypermetabolic possibly due to mediastinal extension or subcarinal lymphadenopathy.  No evidence of metastatic disease in the abdomen or pelvis was appreciated, he does have a low-level FDG uptake within the right adrenal gland that has been compared to prior imaging and stable since 2018 felt to be a benign adenoma.  Hypermetabolic right paraspinal consolidation was felt to be inflammatory in nature.  He underwent bronchoscopy with Dr. Thora Lance on 06/20/2022 and cytology confirmed non-small cell lung cancer.  Further characterization could not be made.  He met with Dr. Bertis Ruddy and she recommended chemoradiation.  He is contacted today to discuss treatment recommendations.    PREVIOUS RADIATION THERAPY: No   PAST MEDICAL HISTORY:  Past Medical History:  Diagnosis Date   AAA (abdominal aortic aneurysm) ([name])    Adrenal mass ([name])    per pt this is remote (10 years) and benign by biopsy   AICD (automatic cardioverter/defibrillator) present    Anxiety    Arthritis    CAD (coronary artery disease)    a. s/p PCI to LAD 2001 b. myoview 2014 high risk with scar LAD/RCA territory but no ischemia   Cardiomyopathy, ischemic    CHF (congestive heart failure) ([name])    class II/III   COPD (chronic obstructive pulmonary disease) ([name])    smokes cigars but has quit cigarettes   Defibrillator discharge 04/21/2019   Diverticulitis    Dyspnea  Flu 03/2015   HTN (hypertension)    Hyperlipidemia    NSVT (nonsustained ventricular tachycardia) ([name]) 03/05/2017   Paroxysmal atrial fibrillation ([name]) 03/2015   chads2vasc score is 4   PSA (psoriatic arthritis) ([name])    increased   PVD (peripheral vascular disease) ([name])        PAST SURGICAL HISTORY: Past Surgical History:  Procedure Laterality Date   ABDOMINAL AORTIC ENDOVASCULAR STENT GRAFT N/A 03/03/2017   Procedure: ABDOMINAL AORTIC ENDOVASCULAR STENT GRAFT;  Surgeon:  Maeola Harman, MD;  Location: [facility] OR;  Service: Vascular;  Laterality: N/A;   BRONCHIAL NEEDLE ASPIRATION BIOPSY  06/20/2022   Procedure: BRONCHIAL NEEDLE ASPIRATION BIOPSIES;  Surgeon: Omar Person, MD;  Location: [facility] ENDOSCOPY;  Service: Pulmonary;;   cataract surgery     CORONARY ANGIOGRAPHY N/A 10/12/2020   Procedure: CORONARY ANGIOGRAPHY;  Surgeon: Runell Gess, MD;  Location: [facility] INVASIVE CV LAB;  Service: Cardiovascular;  Laterality: N/A;   CORONARY ANGIOPLASTY WITH STENT PLACEMENT  2001   a. PCI to LAD   IMPLANTABLE CARDIOVERTER DEFIBRILLATOR (ICD) GENERATOR CHANGE N/A 03/18/2014   a. SJM Fortify ST DR ICD implanted by [facility] for primary prevention b. gen change 03/2014    LEAD REVISION/REPAIR N/A 07/17/2016   Procedure: Lead Revision/Repair;  Surgeon: Marinus Maw, MD;  Location: [facility] INVASIVE CV LAB;  Service: Cardiovascular;  Laterality: N/A;   LEFT HEART CATHETERIZATION WITH CORONARY ANGIOGRAM N/A 05/27/2014   Procedure: LEFT HEART CATHETERIZATION WITH CORONARY ANGIOGRAM;  Surgeon: Tonny Bollman, MD;  Location: [facility] CATH LAB;  Service: Cardiovascular;  Laterality: N/A;   REPAIR KNEE LIGAMENT     V TACH ABLATION N/A 10/26/2020   Procedure: V TACH ABLATION;  Surgeon: Marinus Maw, MD;  Location: [facility] INVASIVE CV LAB;  Service: Cardiovascular;  Laterality: N/A;   VIDEO BRONCHOSCOPY WITH ENDOBRONCHIAL ULTRASOUND N/A 06/20/2022   Procedure: VIDEO BRONCHOSCOPY WITH ENDOBRONCHIAL ULTRASOUND;  Surgeon: Omar Person, MD;  Location: [facility] ENDOSCOPY;  Service: Pulmonary;  Laterality: N/A;     FAMILY HISTORY:  Family History  Problem Relation Age of Onset   Cirrhosis Mother        due to ETOH   Cancer Neg Hx      SOCIAL HISTORY:  reports that he has been smoking cigars. He has never used smokeless tobacco. He reports that he does not drink alcohol and does not use drugs.The patient is married and lives in [place]. 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 He and his  wife have a good network of friends and family members who live nearby.   ALLERGIES: Patient has no known allergies.   MEDICATIONS:  Current Outpatient Medications  Medication Sig Dispense Refill   acetaminophen (TYLENOL) 500 MG tablet Take 500-1,000 mg by mouth every 8 (eight) hours as needed (for pain).      amiodarone (PACERONE) 400 MG tablet Take 0.5 tablets (200 mg total) by mouth 2 (two) times daily. 28 tablet 0   atorvastatin (LIPITOR) 80 MG tablet TAKE 1 TABLET BY MOUTH DAILY AT 6 PM (Patient taking differently: Take 80 mg by mouth daily. TAKE 1 TABLET BY MOUTH  DAILY AT 6 PM) 90 tablet 3   isosorbide mononitrate (IMDUR) 30 MG 24 hr tablet TAKE 1/2 OF A TABLET (15 MG TOTAL) BY MOUTH DAILY (Patient taking differently: Take 30 mg by mouth daily.) 45 tablet 3   melatonin 5 MG TABS Take 1 tablet (5 mg total) by mouth at bedtime. 30 tablet 0   metoprolol succinate (  TOPROL-XL) 50 MG 24 hr tablet Take 1 tablet (50 mg total) by mouth 2 (two) times daily. Take with or immediately following a meal. 30 tablet 3   mometasone-formoterol (DULERA) 100-5 MCG/ACT AERO Inhale 2 puffs into the lungs in the morning and at bedtime. 8.8 each 1   Multiple Vitamin (MULTIVITAMIN) capsule Take 1 capsule by mouth daily.     pantoprazole (PROTONIX) 40 MG tablet Take 40 mg by mouth daily.     potassium chloride SA (KLOR-CON M20) 20 MEQ tablet TAKE 1 TABLET (20 MEQ TOTAL) BY MOUTH DAILY. PLEASE SCHEDULE APPOINTMENT FOR ADDITIONAL REFILLS. (Patient taking differently: Take 20 mEq by mouth daily.) 90 tablet 2   rivaroxaban (XARELTO) 20 MG TABS tablet Take 1 tablet (20 mg total) by mouth daily with supper. 30 tablet 3   sacubitril-valsartan (ENTRESTO) 97-103 MG TAKE 1 TABLET BY MOUTH TWICE A DAY (Patient taking differently: Take 1 tablet by mouth 2 (two) times daily.) 180 tablet 3   spironolactone (ALDACTONE) 25 MG tablet Take 1 tablet (25 mg total) by mouth daily. 30 tablet 3   No current facility-administered  medications for this encounter.     REVIEW OF SYSTEMS: On review of systems, the patient reports that he is doing okay overall. He's quite fatigued, and short of breath with exertion which is somewhat baseline with his cardiac history. He has lost about 14 pounds unintentionally in the last 8 months or so. He reports a good appetite, but has had some struggles on several occasions with regurgitation. He reports this has not been worked up previously. No other complaints are verbalized.      PHYSICAL EXAM:  Wt Readings from Last 3 Encounters:  07/03/22 130 lb (59 kg)  06/28/22 130 lb 3.2 oz (59.1 kg)  06/20/22 134 lb (60.8 kg)    Pain Assessment Pain Score: 0-No pain/10   Otherwise unable to assess due to encounter type.   ECOG = 1  0 - Asymptomatic (Fully active, able to carry on all predisease activities without restriction)  1 - Symptomatic but completely ambulatory (Restricted in physically strenuous activity but ambulatory and able to carry out work of a light or sedentary nature. For example, light housework, office work)  2 - Symptomatic, <50% in bed during the day (Ambulatory and capable of all self care but unable to carry out any work activities. Up and about more than 50% of waking hours)  3 - Symptomatic, >50% in bed, but not bedbound (Capable of only limited self-care, confined to bed or chair 50% or more of waking hours)  4 - Bedbound (Completely disabled. Cannot carry on any self-care. Totally confined to bed or chair)  5 - Death   Santiago Glad MM, Creech RH, Tormey DC, et al. [phone]). "Toxicity and response criteria of the [facility]". Am. Evlyn Clines. Oncol. 5 (6): 649-55    LABORATORY DATA:  Lab Results  Component Value Date   WBC 8.4 07/02/2022   HGB 11.2 (L) 07/02/2022   HCT 34.9 (L) 07/02/2022   MCV 79.7 (L) 07/02/2022   PLT 361 07/02/2022   Lab Results  Component Value Date   NA 136 07/02/2022   K 4.5 07/02/2022   CL 106 07/02/2022    CO2 23 07/02/2022   Lab Results  Component Value Date   ALT 13 07/02/2022   AST 18 07/02/2022   ALKPHOS 66 07/02/2022   BILITOT 0.6 07/02/2022      RADIOGRAPHY: NM PET Image Initial (PI) Skull Base  To Thigh  Result Date: 07/02/2022 CLINICAL DATA:  Initial treatment strategy for lung cancer. EXAM: NUCLEAR MEDICINE PET SKULL BASE TO THIGH TECHNIQUE: 6.5 mCi F-18 FDG was injected intravenously. Full-ring PET imaging was performed from the skull base to thigh after the radiotracer. CT data was obtained and used for attenuation correction and anatomic localization. Fasting blood glucose: 107 mg/dl COMPARISON:  Chest CT dated June 03, 2022; CT abdomen and pelvis dated November 15, 2016 FINDINGS: Mediastinal blood pool activity: SUV max 2.0 Liver activity: SUV max NA NECK: No hypermetabolic lymph nodes in the neck. Incidental CT findings: None. CHEST: Large right suprahilar hypermetabolic mass, SUV max of 23.4. Subcarinal hypermetabolic activity which is contiguous with right hilar mass seen on series 604, image 72, possibly due to mediastinal extension or subcarinal lymphadenopathy. No other hypermetabolic lymph nodes seen in the chest. Hypermetabolic right paraspinal consolidation, likely infectious or inflammatory. Incidental CT findings: Moderate centrilobular emphysema. Bilateral bronchial wall thickening and scattered areas of mucous plugging. ABDOMEN/PELVIS: No abnormal hypermetabolic activity within the liver, pancreas, adrenal glands, or spleen. No hypermetabolic lymph nodes in the abdomen or pelvis. Incidental CT findings: Atherosclerotic disease of the abdominal aorta. Abdominal aortic aneurysm status post endovascular repair. Right adrenal gland mass measuring up to 3.5 cm with low-level FDG uptake. Prostatomegaly. SKELETON: No focal hypermetabolic activity to suggest skeletal metastasis. Incidental CT findings: None. IMPRESSION: 1. Right suprahilar hypermetabolic mass, consistent with primary  lung malignancy. 2. Subcarinal hypermetabolic activity which is contiguous with right hilar mass, possibly due to mediastinal extension or subcarinal lymphadenopathy. 3. No evidence of metastatic disease in the abdomen or pelvis. 4. Right adrenal gland mass with low-level FDG uptake, unchanged when compared with prior exams dating back to 2018 and consistent with benign adenoma. 5. Hypermetabolic right paraspinal consolidation, likely infectious or inflammatory. 6. Aortic Atherosclerosis (ICD10-I70.0) and Emphysema (ICD10-J43.9). Electronically Signed   By: Allegra Lai M.D.   On: 07/02/2022 10:46   DG Chest Port 1 View  Result Date: 06/24/2022 CLINICAL DATA:  77 year old male with congestive heart failure EXAM: PORTABLE CHEST 1 VIEW COMPARISON:  06/02/2022 FINDINGS: Cardiac diameter unchanged. Left chest wall pacing device/AICD, unchanged. Opacity in the right hilar region, compatible with known malignancy. No pneumothorax.  No pleural effusion. No interlobular septal thickening. Coarsened interstitial markings similar to the prior. Peripheral nodular changes of the right upper lung similar to the comparison and better characterized on recent chest CT imaging. No new confluent airspace disease. IMPRESSION: Negative for acute cardiopulmonary disease. Right hilar opacity, compatible with known malignancy. Electronically Signed   By: Gilmer Mor D.O.   On: 06/24/2022 09:15   US AORTA DUPLEX LIMITED  Result Date: 06/06/2022 CLINICAL DATA:  Abdominal aortic aneurysm. EXAM: ULTRASOUND OF ABDOMINAL AORTA TECHNIQUE: Ultrasound examination of the abdominal aorta and proximal common iliac arteries was performed to evaluate for aneurysm. Additional color and Doppler images of the distal aorta were obtained to document patency. COMPARISON:  October 14, 2020. FINDINGS: Abdominal aortic measurements as follows: Proximal:  2.3 cm Mid:  2.4 cm Distal:  6.1 cm Patent: Yes, peak systolic velocity is 61 cm/s Status  post endograft placement. Right common iliac artery: 1.4 cm Left common iliac artery: 1.5 cm IMPRESSION: Status post abdominal aortic endograft placement. Distal portion of occluded aneurysmal sac measures 6.1 cm. Endoleak cannot be excluded on the basis of this exam. If there is clinical concern for endoleak, CT angiography is recommended for further evaluation. Electronically Signed   By: Zenda Alpers.D.  On: 06/06/2022 15:02   CT ANGIO HEAD NECK W WO CM  Result Date: 06/05/2022 CLINICAL DATA:  Lung cancer staging EXAM: CT ANGIOGRAPHY HEAD AND NECK WITH AND WITHOUT CONTRAST TECHNIQUE: Multidetector CT imaging of the head and neck was performed using the standard protocol during bolus administration of intravenous contrast. Multiplanar CT image reconstructions and MIPs were obtained to evaluate the vascular anatomy. Carotid stenosis measurements (when applicable) are obtained utilizing NASCET criteria, using the distal internal carotid diameter as the denominator. RADIATION DOSE REDUCTION: This exam was performed according to the departmental dose-optimization program which includes automated exposure control, adjustment of the mA and/or kV according to patient size and/or use of iterative reconstruction technique. CONTRAST:  75mL OMNIPAQUE IOHEXOL 350 MG/ML SOLN COMPARISON:  CT CHest 06/03/22, CTA chest 10/13/21, CT head 03/30/21, CTA head/neck 03/30/21 FINDINGS: CT HEAD FINDINGS Brain: Unchanged hyperdensities in the medial aspect of bilateral occipital lobes. No CT evidence of an acute cortical infarct. There is redemonstration of slight prominence of the extra-axial space along the right frontal lobe, which could represent a small chronic subdural hematoma or a central hygroma. No hemorrhage. Vascular: See below Skull: Normal. Negative for fracture or focal lesion. Sinuses/Orbits: No middle ear mastoid effusion. Paranasal sinuses noted focal 4 trace mucosal thickening in the posterior ethmoid air cells on  the right. Bilateral lens replacement orbits are otherwise unremarkable. Other: None Review of the MIP images confirms the above findings CTA NECK FINDINGS Aortic arch: Standard branching. Imaged portion shows no evidence of aneurysm or dissection. No significant stenosis of the major arch vessel origins. Mild atherosclerotic plaque in the proximal aspect of the left subclavian artery. Right carotid system: No evidence of dissection, stenosis (50% or greater), or occlusion. Left carotid system: No evidence of dissection, stenosis (50% or greater), or occlusion. Vertebral arteries: Is likely moderate to severe multifocal stenosis in the V1 segment of the right vertebral artery. Origin of the left vertebral artery is not assessed due to a combination of streak and motion artifact. There is calcified atherosclerotic plaque in the bilateral vertebral arteries. Skeleton: Unchanged sclerotic appearance of the vertebral bodies in the lower cervical spinal C5-C7. Similar findings are seen at the C2 level and the posterior elements of C3 on C4. Other neck: Patient is edentulous. Upper chest: Severe centrilobular emphysema. See prior CT chest dated 06/03/2022 for additional intrathoracic findings. Review of the MIP images confirms the above findings CTA HEAD FINDINGS Anterior circulation: No significant proximal occlusion, aneurysm, or vascular malformation. There is mild-to-moderate narrowing of the bifurcation of the right MCA. Posterior circulation: No significant stenosis, proximal occlusion, aneurysm, or vascular malformation. Venous sinuses: As permitted by contrast timing, patent. Anatomic variants: Review of the MIP images confirms the above findings IMPRESSION: 1. Combination of CT technique and arterial timing of the contras bolus significantly limits and essentially precludes assessment for intracranial metastatic disease. Within this limitation, no contrast enhancing lesion is visualized. 2. No CT evidence of an  acute cortical infarct. 3. No significant proximal occlusion, aneurysm, or vascular malformation in the intracranial circulation. Mild-to-moderate narrowing of the bifurcation of the right MCA. 4. Likely moderate to severe multifocal stenosis in the V1 segment of the right vertebral artery. Origin of the left vertebral artery is not assessed due to a combination of streak and motion artifact. 5. Severe centrilobular emphysema. See prior CT chest dated 06/03/2022 for additional intrathoracic findings. 6. Unchanged appearance of the cervical spine vertebral bodies compared to 03/30/21. Emphysema (ICD10-J43.9). Electronically Signed   By: Sandria Senter  Celine Mans M.D.   On: 06/05/2022 15:33       IMPRESSION/PLAN: 1. Stage IIIC, cT2N2M0, NSCLC, involving a suprahilar mass in the right lung. Dr. Mitzi Hansen discusses the pathology findings and reviews the nature of locally advanced lung cancer. He recommends a course of chemoradiation. We discussed the risks, benefits, short, and long term effects of radiotherapy, as well as the curative intent, and the patient is interested in proceeding. Dr. Mitzi Hansen discusses the delivery and logistics of radiotherapy and anticipates a course of 6 1/2 weeks of radiotherapy to the right lung and regional nodes of the chest. The patient will be contacted to coordinate treatment planning by our simulation department. We anticipate starting treatment on 07/15/22 if that works with Dr. Bertis Ruddy and her team as well.  2. Regurgitation. This has not been worked up in the past but if this persists, we would recommend evaluation with GI. The patient is in agreement.  3. Weight loss secondary to #1 and #2. We will refer him to nutrition for evaluation and management.  4. In Situ Pacemaker. The patient's cardiology team will be notified of our plans to offer radiation. His treatment should be >10 cm from his device. We will reach out to the device clinic to proceed.    This encounter was conducted via  telephone.  The patient has provided two factor identification and has given verbal consent for this type of encounter and has been advised to only accept a meeting of this type in a secure network environment. The time spent during this encounter was 60 minutes including preparation, discussion, and coordination of the patient's care. The attendants for this meeting include  Dr. Mitzi Hansen, Ronny Bacon  and [name] and Dellis Filbert.  During the encounter,  Dr. Mitzi Hansen, and Ronny Bacon were located at [facility] Radiation Oncology Department.  [name] was located at home with his wife Pinckney Ferlita.   The above documentation reflects my direct findings during this shared patient visit. Please see the separate note by Dr. Mitzi Hansen on this date for the remainder of the patient's plan of care.    Osker Mason, [facility]   **Disclaimer: This note was dictated with voice recognition software. Similar sounding words can inadvertently be transcribed and this note may contain transcription errors which may not have been corrected upon publication of note.**

## 2022-07-04 ENCOUNTER — Other Ambulatory Visit: Payer: Self-pay | Admitting: Hematology and Oncology

## 2022-07-04 ENCOUNTER — Other Ambulatory Visit: Payer: Self-pay

## 2022-07-04 ENCOUNTER — Telehealth: Payer: Self-pay | Admitting: Hematology and Oncology

## 2022-07-04 DIAGNOSIS — C3491 Malignant neoplasm of unspecified part of right bronchus or lung: Secondary | ICD-10-CM

## 2022-07-04 NOTE — Progress Notes (Signed)
Remote ICD transmission.   

## 2022-07-04 NOTE — Telephone Encounter (Signed)
Spoke with patient confirming upcoming appointment  

## 2022-07-05 ENCOUNTER — Other Ambulatory Visit: Payer: Self-pay

## 2022-07-05 ENCOUNTER — Telehealth: Payer: Self-pay | Admitting: Hematology and Oncology

## 2022-07-05 NOTE — Progress Notes (Signed)
The proposed treatment discussed in conference is for discussion purpose only and is not a binding recommendation.  The patients have not been physically examined, or presented with their treatment options.  Therefore, final treatment plans cannot be decided.  

## 2022-07-05 NOTE — Telephone Encounter (Signed)
Spoke with patient wife confirming upcoming appointment

## 2022-07-07 ENCOUNTER — Other Ambulatory Visit: Payer: Self-pay | Admitting: Radiology

## 2022-07-08 ENCOUNTER — Other Ambulatory Visit: Payer: Self-pay

## 2022-07-08 ENCOUNTER — Ambulatory Visit
Admission: RE | Admit: 2022-07-08 | Discharge: 2022-07-08 | Disposition: A | Discharge status: Home / Self Care | Payer: Medicare HMO | Source: Ambulatory Visit | Attending: Radiation Oncology | Admitting: Radiation Oncology

## 2022-07-08 ENCOUNTER — Encounter: Payer: Self-pay | Admitting: Internal Medicine

## 2022-07-08 DIAGNOSIS — Z510 Encounter for antineoplastic radiation therapy: Secondary | ICD-10-CM | POA: Diagnosis present

## 2022-07-08 DIAGNOSIS — C3480 Malignant neoplasm of overlapping sites of unspecified bronchus and lung: Secondary | ICD-10-CM | POA: Diagnosis present

## 2022-07-08 NOTE — Progress Notes (Signed)
TO BE COMPLETED BY RADIATION ONCOLOGIST OFFICE:   Patient Name: Benjamin Cook   Date of Birth: 1945/07/15   Radiation Oncologist:   Site to be Treated:   Will x-rays >10 MV be used? No  Will the radiation be >10 cm from the device? Yes  Planned Treatment Start Date:   TO BE COMPLETED BY CARDIOLOGIST OFFICE:   Device Information:  Pacemaker []      ICD [x]    Brand: [employer]: [phone] Model #: Satira Sark [employer] [id number] Fortify [address] DR  Serial Number: [id number]     Date of Placement: 03/18/2014  Site of Placement: Left Chest  Remote Device Check--Frequency: Every 91 days   Last Check: 5/30/52024  Is the Patient Pacer Dependent?:  Yes []   No [x]   Does cardiologist request Radiation Oncology to schedule device testing by vendor for the following:  Prior to the Initiation of Treatments?  Yes []  No [x]  During Treatments?  Yes []  No [x]  Post Radiation Treatments?  Yes []  No [x]   Is device monitoring necessary by vendor/cardiologist team during treatments?  Yes []   No [x]   Is cardiac monitoring by Radiation Oncology nursing necessary during treatments? Yes [x]   No []   Do you recommend device be relocated prior to Radiation Treatment? Yes []   No [x]   **PLEASE LIST ANY NOTES OR SPECIAL REQUESTS:       CARDIOLOGIST SIGNATURE:  Dr. [name] Per Device Clinic Standing Orders, [name]  07/08/2022 3:44 PM  **Please route completed form back to Radiation Oncology Nursing and "P [facility] RAD ONC ADMIN", OR send an update if there will be a delay in having form completed by expected start date.  **Call [phone] if you have any questions or do not get an in-basket response from a Radiation Oncology staff member

## 2022-07-08 NOTE — Consult Note (Signed)
Chief Complaint: Patient was seen in consultation today for port a cath placement  Referring Physician(s): Gorsuch,Ni  Supervising Physician: Mir, Mauri Reading  Patient Status: [facility] - Out-pt  History of Present Illness: Benjamin Cook is a 77 y.o. male smoker with past medical history of AAA, AICD, coronary artery disease, ischemic cardiomyopathy, CHF, COPD, diverticulosis, hypertension, hyperlipidemia, paroxysmal A-fib, psoriatic arthritis, peripheral vascular disease who presents now with newly diagnosed non-small cell carcinoma of the right lung.  He is scheduled today for Port-A-Cath placement to assist with treatment.  Past Medical History:  Diagnosis Date   AAA (abdominal aortic aneurysm) ([name])    Adrenal mass ([name])    per pt this is remote (10 years) and benign by biopsy   AICD (automatic cardioverter/defibrillator) present    Anxiety    Arthritis    CAD (coronary artery disease)    a. s/p PCI to LAD 2001 b. myoview 2014 high risk with scar LAD/RCA territory but no ischemia   Cardiomyopathy, ischemic    CHF (congestive heart failure) ([name])    class II/III   COPD (chronic obstructive pulmonary disease) ([name])    smokes cigars but has quit cigarettes   Defibrillator discharge 04/21/2019   Diverticulitis    Dyspnea    Flu 03/2015   HTN (hypertension)    Hyperlipidemia    NSVT (nonsustained ventricular tachycardia) ([name]) 03/05/2017   Paroxysmal atrial fibrillation ([name]) 03/2015   chads2vasc score is 4   PSA (psoriatic arthritis) ([name])    increased   PVD (peripheral vascular disease) ([name])     Past Surgical History:  Procedure Laterality Date   ABDOMINAL AORTIC ENDOVASCULAR STENT GRAFT N/A 03/03/2017   Procedure: ABDOMINAL AORTIC ENDOVASCULAR STENT GRAFT;  Surgeon: Maeola Harman, MD;  Location: [facility] OR;  Service: Vascular;  Laterality: N/A;   BRONCHIAL NEEDLE ASPIRATION BIOPSY  06/20/2022   Procedure: BRONCHIAL NEEDLE ASPIRATION BIOPSIES;  Surgeon: Omar Person, MD;  Location: [facility] ENDOSCOPY;  Service: Pulmonary;;   cataract surgery     CORONARY ANGIOGRAPHY N/A 10/12/2020   Procedure: CORONARY ANGIOGRAPHY;  Surgeon: Runell Gess, MD;  Location: [facility] INVASIVE CV LAB;  Service: Cardiovascular;  Laterality: N/A;   CORONARY ANGIOPLASTY WITH STENT PLACEMENT  2001   a. PCI to LAD   IMPLANTABLE CARDIOVERTER DEFIBRILLATOR (ICD) GENERATOR CHANGE N/A 03/18/2014   a. SJM Fortify ST DR ICD implanted by [facility] for primary prevention b. gen change 03/2014    LEAD REVISION/REPAIR N/A 07/17/2016   Procedure: Lead Revision/Repair;  Surgeon: Marinus Maw, MD;  Location: [facility] INVASIVE CV LAB;  Service: Cardiovascular;  Laterality: N/A;   LEFT HEART CATHETERIZATION WITH CORONARY ANGIOGRAM N/A 05/27/2014   Procedure: LEFT HEART CATHETERIZATION WITH CORONARY ANGIOGRAM;  Surgeon: Tonny Bollman, MD;  Location: [facility] CATH LAB;  Service: Cardiovascular;  Laterality: N/A;   REPAIR KNEE LIGAMENT     V TACH ABLATION N/A 10/26/2020   Procedure: V TACH ABLATION;  Surgeon: Marinus Maw, MD;  Location: [facility] INVASIVE CV LAB;  Service: Cardiovascular;  Laterality: N/A;   VIDEO BRONCHOSCOPY WITH ENDOBRONCHIAL ULTRASOUND N/A 06/20/2022   Procedure: VIDEO BRONCHOSCOPY WITH ENDOBRONCHIAL ULTRASOUND;  Surgeon: Omar Person, MD;  Location: [facility] ENDOSCOPY;  Service: Pulmonary;  Laterality: N/A;    Allergies: Patient has no known allergies.  Medications: Prior to Admission medications   Medication Sig Start Date End Date Taking? Authorizing Provider  acetaminophen (TYLENOL) 500 MG tablet Take 500-1,000 mg by mouth every 8 (eight) hours as needed (for pain).  [provider]  amiodarone (PACERONE) 400 MG tablet Take 0.5 tablets (200 mg total) by mouth 2 (two) times daily. 06/08/22   Danford, Earl Lites, MD  atorvastatin (LIPITOR) 80 MG tablet TAKE 1 TABLET BY MOUTH DAILY AT 6 PM Patient taking differently: Take 80 mg by mouth daily. TAKE 1 TABLET BY MOUTH  DAILY AT 6 PM  11/23/21   [name], MD  isosorbide mononitrate (IMDUR) 30 MG 24 hr tablet TAKE 1/2 OF A TABLET (15 MG TOTAL) BY MOUTH DAILY Patient taking differently: Take 30 mg by mouth daily. 12/18/21   [name], MD  melatonin 5 MG TABS Take 1 tablet (5 mg total) by mouth at bedtime. 10/16/20   Marinda Elk, MD  metoprolol succinate (TOPROL-XL) 50 MG 24 hr tablet Take 1 tablet (50 mg total) by mouth 2 (two) times daily. Take with or immediately following a meal. 06/08/22   Danford, Earl Lites, MD  mometasone-formoterol (DULERA) 100-5 MCG/ACT AERO Inhale 2 puffs into the lungs in the morning and at bedtime. 01/31/22   Rosalinda Seaman, Fayrene Fearing, MD  Multiple Vitamin (MULTIVITAMIN) capsule Take 1 capsule by mouth daily.    [provider]  pantoprazole (PROTONIX) 40 MG tablet Take 40 mg by mouth daily. 06/07/22   [provider]  potassium chloride SA (KLOR-CON M20) 20 MEQ tablet TAKE 1 TABLET (20 MEQ TOTAL) BY MOUTH DAILY. PLEASE SCHEDULE APPOINTMENT FOR ADDITIONAL REFILLS. Patient taking differently: Take 20 mEq by mouth daily. 05/20/22   Marinus Maw, MD  rivaroxaban (XARELTO) 20 MG TABS tablet Take 1 tablet (20 mg total) by mouth daily with supper. 05/08/22   [name], MD  sacubitril-valsartan (ENTRESTO) 97-103 MG TAKE 1 TABLET BY MOUTH TWICE A DAY Patient taking differently: Take 1 tablet by mouth 2 (two) times daily. 05/21/22   [name], MD  spironolactone (ALDACTONE) 25 MG tablet Take 1 tablet (25 mg total) by mouth daily. 06/08/22   Danford, Earl Lites, MD     Family History  Problem Relation Age of Onset   Cirrhosis Mother        due to ETOH   Cancer Neg Hx     Social History   Socioeconomic History   Marital status: Married    Spouse name: Not on file   Number of children: Not on file   Years of education: Not on file   Highest education level: Not on file  Occupational History   Occupation: Lawn care  Tobacco Use   Smoking status: Some  Days    Types: Cigars    Last attempt to quit: 2020    Years since quitting: 4.4   Smokeless tobacco: Never   Tobacco comments:    smokes cigars now, no longer smokes cigarettes but previously smoked 2ppd  Vaping Use   Vaping Use: Never used  Substance and Sexual Activity   Alcohol use: No   Drug use: No   Sexual activity: Not Currently    Partners: Female    Birth control/protection: None  Other Topics Concern   Not on file  Social History Narrative   Not on file   Social Determinants of Health   Financial Resource Strain: Not on file  Food Insecurity: No Food Insecurity (06/03/2022)   Hunger Vital Sign    Worried About Running Out of Food in the Last Year: Never true    Ran Out of Food in the Last Year: Never true  Transportation Needs: No Transportation Needs (06/03/2022)  PRAPARE - Administrator, Civil Service (Medical): No    Lack of Transportation (Non-Medical): No  Physical Activity: Not on file  Stress: Not on file  Social Connections: Not on file      Review of Systems  Vital Signs:   Code Status:   Advance Care Plan: {Advance Care ZOXW:96045}    Physical Exam  Imaging: NM PET Image Initial (PI) Skull Base To Thigh  Result Date: 07/02/2022 CLINICAL DATA:  Initial treatment strategy for lung cancer. EXAM: NUCLEAR MEDICINE PET SKULL BASE TO THIGH TECHNIQUE: 6.5 mCi F-18 FDG was injected intravenously. Full-ring PET imaging was performed from the skull base to thigh after the radiotracer. CT data was obtained and used for attenuation correction and anatomic localization. Fasting blood glucose: 107 mg/dl COMPARISON:  Chest CT dated June 03, 2022; CT abdomen and pelvis dated November 15, 2016 FINDINGS: Mediastinal blood pool activity: SUV max 2.0 Liver activity: SUV max NA NECK: No hypermetabolic lymph nodes in the neck. Incidental CT findings: None. CHEST: Large right suprahilar hypermetabolic mass, SUV max of 23.4. Subcarinal hypermetabolic  activity which is contiguous with right hilar mass seen on series 604, image 72, possibly due to mediastinal extension or subcarinal lymphadenopathy. No other hypermetabolic lymph nodes seen in the chest. Hypermetabolic right paraspinal consolidation, likely infectious or inflammatory. Incidental CT findings: Moderate centrilobular emphysema. Bilateral bronchial wall thickening and scattered areas of mucous plugging. ABDOMEN/PELVIS: No abnormal hypermetabolic activity within the liver, pancreas, adrenal glands, or spleen. No hypermetabolic lymph nodes in the abdomen or pelvis. Incidental CT findings: Atherosclerotic disease of the abdominal aorta. Abdominal aortic aneurysm status post endovascular repair. Right adrenal gland mass measuring up to 3.5 cm with low-level FDG uptake. Prostatomegaly. SKELETON: No focal hypermetabolic activity to suggest skeletal metastasis. Incidental CT findings: None. IMPRESSION: 1. Right suprahilar hypermetabolic mass, consistent with primary lung malignancy. 2. Subcarinal hypermetabolic activity which is contiguous with right hilar mass, possibly due to mediastinal extension or subcarinal lymphadenopathy. 3. No evidence of metastatic disease in the abdomen or pelvis. 4. Right adrenal gland mass with low-level FDG uptake, unchanged when compared with prior exams dating back to 2018 and consistent with benign adenoma. 5. Hypermetabolic right paraspinal consolidation, likely infectious or inflammatory. 6. Aortic Atherosclerosis (ICD10-I70.0) and Emphysema (ICD10-J43.9). Electronically Signed   By: Allegra Lai M.D.   On: 07/02/2022 10:46   DG Chest Port 1 View  Result Date: 06/24/2022 CLINICAL DATA:  77 year old male with congestive heart failure EXAM: PORTABLE CHEST 1 VIEW COMPARISON:  06/02/2022 FINDINGS: Cardiac diameter unchanged. Left chest wall pacing device/AICD, unchanged. Opacity in the right hilar region, compatible with known malignancy. No pneumothorax.  No pleural  effusion. No interlobular septal thickening. Coarsened interstitial markings similar to the prior. Peripheral nodular changes of the right upper lung similar to the comparison and better characterized on recent chest CT imaging. No new confluent airspace disease. IMPRESSION: Negative for acute cardiopulmonary disease. Right hilar opacity, compatible with known malignancy. Electronically Signed   By: Gilmer Mor D.O.   On: 06/24/2022 09:15    Labs:  CBC: Recent Labs    06/07/22 0414 06/08/22 0200 06/20/22 0708 07/02/22 1417  WBC 7.8 8.0 9.0 8.4  HGB 9.6* 9.1* 10.3* 11.2*  HCT 29.5* 28.0* 33.8* 34.9*  PLT 288 251 323 361    COAGS: Recent Labs    10/14/21 0550  INR 1.2    BMP: Recent Labs    06/07/22 0414 06/08/22 0200 06/20/22 0708 07/02/22 1417  NA 134* 134*  133* 136  K 3.7 3.6 4.8 4.5  CL 102 104 104 106  CO2 23 23 21* 23  GLUCOSE 93 99 97 105*  BUN <5* 5* 9 10  CALCIUM 8.1* 8.0* 8.5* 8.7*  CREATININE 0.84 0.90 0.87 0.81  GFRNONAA >60 >60 >60 >60    LIVER FUNCTION TESTS: Recent Labs    10/14/21 0550 01/22/22 0829 06/03/22 0700 07/02/22 1417  BILITOT 0.3 0.7 0.5 0.6  AST 62* 47* 20 18  ALT 61* 49* 14 13  ALKPHOS 59 63 51 66  PROT 7.0 7.7 6.6 7.5  ALBUMIN 3.0* 2.9* 2.2* 3.2*    TUMOR MARKERS: No results for input(s): "AFPTM", "CEA", "CA199", "CHROMGRNA" in the last 8760 hours.  Assessment and Plan: 77 y.o. male smoker with past medical history of AAA, AICD, coronary artery disease, ischemic cardiomyopathy, CHF, COPD, diverticulosis, hypertension, hyperlipidemia, paroxysmal A-fib, psoriatic arthritis, peripheral vascular disease who presents now with newly diagnosed non-small cell carcinoma of the right lung.  He is scheduled today for Port-A-Cath placement to assist with treatment.Risks and benefits of image guided port-a-catheter placement was discussed with the patient including, but not limited to bleeding, infection, pneumothorax, or fibrin sheath  development and need for additional procedures.  All of the patient's questions were answered, patient is agreeable to proceed. Consent signed and in chart.    Thank you for this interesting consult.  I greatly enjoyed meeting Benjamin Cook and look forward to participating in their care.  A copy of this report was sent to the requesting provider on this date.  Electronically Signed: D. Jeananne Rama, PA-C 07/08/2022, 4:05 PM   I spent a total of  25 minutes   in face to face in clinical consultation, greater than 50% of which was counseling/coordinating care for port a cath placement

## 2022-07-09 ENCOUNTER — Ambulatory Visit (HOSPITAL_COMMUNITY)
Admission: RE | Admit: 2022-07-09 | Discharge: 2022-07-09 | Disposition: A | Discharge status: Home / Self Care | Payer: Medicare HMO | Source: Ambulatory Visit | Attending: Hematology and Oncology | Admitting: Hematology and Oncology

## 2022-07-09 ENCOUNTER — Encounter (HOSPITAL_COMMUNITY): Payer: Self-pay

## 2022-07-09 ENCOUNTER — Inpatient Hospital Stay: Payer: Medicare HMO | Attending: Hematology and Oncology | Admitting: Dietician

## 2022-07-09 DIAGNOSIS — C3491 Malignant neoplasm of unspecified part of right bronchus or lung: Secondary | ICD-10-CM | POA: Insufficient documentation

## 2022-07-09 DIAGNOSIS — I2510 Atherosclerotic heart disease of native coronary artery without angina pectoris: Secondary | ICD-10-CM | POA: Diagnosis not present

## 2022-07-09 DIAGNOSIS — I714 Abdominal aortic aneurysm, without rupture, unspecified: Secondary | ICD-10-CM | POA: Insufficient documentation

## 2022-07-09 DIAGNOSIS — J449 Chronic obstructive pulmonary disease, unspecified: Secondary | ICD-10-CM | POA: Insufficient documentation

## 2022-07-09 DIAGNOSIS — Z7901 Long term (current) use of anticoagulants: Secondary | ICD-10-CM | POA: Insufficient documentation

## 2022-07-09 DIAGNOSIS — I110 Hypertensive heart disease with heart failure: Secondary | ICD-10-CM | POA: Insufficient documentation

## 2022-07-09 DIAGNOSIS — I509 Heart failure, unspecified: Secondary | ICD-10-CM | POA: Insufficient documentation

## 2022-07-09 DIAGNOSIS — K5909 Other constipation: Secondary | ICD-10-CM | POA: Insufficient documentation

## 2022-07-09 DIAGNOSIS — E785 Hyperlipidemia, unspecified: Secondary | ICD-10-CM | POA: Insufficient documentation

## 2022-07-09 DIAGNOSIS — Z7951 Long term (current) use of inhaled steroids: Secondary | ICD-10-CM | POA: Insufficient documentation

## 2022-07-09 DIAGNOSIS — Z87891 Personal history of nicotine dependence: Secondary | ICD-10-CM | POA: Insufficient documentation

## 2022-07-09 DIAGNOSIS — I480 Paroxysmal atrial fibrillation: Secondary | ICD-10-CM | POA: Insufficient documentation

## 2022-07-09 DIAGNOSIS — C3490 Malignant neoplasm of unspecified part of unspecified bronchus or lung: Secondary | ICD-10-CM | POA: Insufficient documentation

## 2022-07-09 DIAGNOSIS — Z79899 Other long term (current) drug therapy: Secondary | ICD-10-CM | POA: Insufficient documentation

## 2022-07-09 DIAGNOSIS — I255 Ischemic cardiomyopathy: Secondary | ICD-10-CM | POA: Insufficient documentation

## 2022-07-09 DIAGNOSIS — D509 Iron deficiency anemia, unspecified: Secondary | ICD-10-CM | POA: Insufficient documentation

## 2022-07-09 DIAGNOSIS — I739 Peripheral vascular disease, unspecified: Secondary | ICD-10-CM | POA: Insufficient documentation

## 2022-07-09 DIAGNOSIS — Z5111 Encounter for antineoplastic chemotherapy: Secondary | ICD-10-CM | POA: Insufficient documentation

## 2022-07-09 HISTORY — PX: IR IMAGING GUIDED PORT INSERTION: IMG5740

## 2022-07-09 MED ORDER — FENTANYL CITRATE (PF) 100 MCG/2ML IJ SOLN
INTRAMUSCULAR | Status: DC | PRN
  Administered 2022-07-09 (×2): 50 ug via INTRAVENOUS

## 2022-07-09 MED ORDER — HEPARIN SOD (PORK) LOCK FLUSH 100 UNIT/ML IV SOLN
INTRAVENOUS | Status: AC
  Filled 2022-07-09: qty 5

## 2022-07-09 MED ORDER — LIDOCAINE-EPINEPHRINE 1 %-1:100000 IJ SOLN
INTRAMUSCULAR | Status: AC
  Filled 2022-07-09: qty 1

## 2022-07-09 MED ORDER — MIDAZOLAM HCL 2 MG/2ML IJ SOLN
INTRAMUSCULAR | Status: DC | PRN
  Administered 2022-07-09 (×2): 1 mg via INTRAVENOUS

## 2022-07-09 MED ORDER — HEPARIN SOD (PORK) LOCK FLUSH 100 UNIT/ML IV SOLN
500.0000 [IU] | Freq: Once | INTRAVENOUS | Status: AC
  Administered 2022-07-09: 500 [IU] via INTRAVENOUS

## 2022-07-09 MED ORDER — FENTANYL CITRATE (PF) 100 MCG/2ML IJ SOLN
INTRAMUSCULAR | Status: AC
  Filled 2022-07-09: qty 2

## 2022-07-09 MED ORDER — SODIUM CHLORIDE 0.9 % IV SOLN: INTRAVENOUS | Status: DC

## 2022-07-09 MED ORDER — MIDAZOLAM HCL 2 MG/2ML IJ SOLN
INTRAMUSCULAR | Status: AC
  Filled 2022-07-09: qty 2

## 2022-07-09 MED ORDER — LIDOCAINE-EPINEPHRINE 1 %-1:100000 IJ SOLN
20.0000 mL | Freq: Once | INTRAMUSCULAR | Status: AC
  Administered 2022-07-09: 20 mL via INTRADERMAL

## 2022-07-09 NOTE — Procedures (Signed)
Interventional Radiology Procedure Note  Procedure: Port placement.  Indication: Lung Ca  Findings: Please refer to procedural dictation for full description.  Complications: None  EBL: < 10 mL  Lionel Woodberry, MD [phone]   

## 2022-07-09 NOTE — Progress Notes (Signed)
Nutrition Assessment   Reason for Assessment: Referral (wt loss)  ASSESSMENT: 77 year old male with newly diagnosed stage III non-small cell lung cancer of right lung. He is planning concurrent chemoradiation with weekly carboplatin + paclitaxel (first chemo 6/10). Patient is under the care of Dr. Bertis Ruddy and Dr. Mitzi Hansen.   Past medical history includes CAD, HTN, chronic combined CHF, COPD, AAA, ischemic cardiomyopathy, lymphangitis, HLD, anemia of chronic disease, ICD in place  Met with patient and wife in office. Patient reports good appetite and eating 2 meals daily as usual. Recalls eggs, bacon, and bowl of cornflakes with 2% milk for breakfast. He snacks during the day (popcorn, cheezits, peaches). Patient recalls ribs, yams, potato salad, tossed salad for dinner. Patient has one sweet cake daily. He reports having single vomiting episodes every 1-2 weeks. Patient relates this to overeating at meals and not chewing his food enough prior to swallowing. Recalls this occurring with steak recently. Patient does not like to wear his dentures when eating, often swallowing larger bites of food. Patient has one cup of coffee in the morning with cream and sugar. He drinks ginger ale, orange juice, and 2 bottles of water. Wife encourages him to drink Boost at home, but he does not like the taste. Patient denies constipation or diarrhea. Wife states she was informed about the food pantry and is requesting to take a look and see what we have.   Nutrition Focused Physical Exam: deferred   Medications: pacerone, lipitor, imdur, toprol, MVI, protonix, klor-con, xarelto, entresto, aldactone   Labs: 5/28 - glucose 105, albumin 3.2   Anthropometrics: Weights have decreased 10% (15lbs) in one month - this is severe for time frame  Height: 5'10" Weight: 130 lb  UBW: 145 lb (06/02/22) BMI: 18.65    NUTRITION DIAGNOSIS: Unintended weight loss related to cancer as evidenced by 10% decrease from usual weight  in one month   INTERVENTION:  Educated on importance of adequate calories and protein to maintain weights/strength during treatment Discussed foods with protein and educated to include protein source at all meals - handout provided  Encouraged high calorie high protein snacks in between meals and at bedtime Discussed ways to add calories/protein to foods (adding Boost to coffee/ice cream, using chicken broth in grits, adding cheese/butter/gravy to foods) Educated on soft moist high protein to foods for ease of intake - handout provided  Pt does not like Boost/Ensure. Suggested trying CIB powder mixed with whole milk as alternate oral supplement, recommend 1-2/day - samples provided  Informed pt wife bags of food are prepared to provide patients in need. She reports understanding and appreciative of bag provided today    MONITORING, EVALUATION, GOAL: Patient will tolerate increased calories and protein to minimize weight loss during treatment    Next Visit: Monday June 17 during infusion with Drema Halon

## 2022-07-09 NOTE — Discharge Instructions (Addendum)

## 2022-07-09 NOTE — Progress Notes (Signed)
Pharmacist Chemotherapy Monitoring - Initial Assessment    Anticipated start date: 07/15/22   The following has been reviewed per standard work regarding the patient's treatment regimen: The patient's diagnosis, treatment plan and drug doses, and organ/hematologic function Lab orders and baseline tests specific to treatment regimen  The treatment plan start date, drug sequencing, and pre-medications Prior authorization status  Patient's documented medication list, including drug-drug interaction screen and prescriptions for anti-emetics and supportive care specific to the treatment regimen The drug concentrations, fluid compatibility, administration routes, and timing of the medications to be used The patient's access for treatment and lifetime cumulative dose history, if applicable  The patient's medication allergies and previous infusion related reactions, if applicable   Changes made to treatment plan:  N/A  Follow up needed:  N/A   Demetrius Charity, RPH, 07/09/2022  11:20 AM

## 2022-07-11 ENCOUNTER — Other Ambulatory Visit: Payer: Medicare HMO

## 2022-07-11 ENCOUNTER — Ambulatory Visit: Payer: Medicare HMO | Admitting: Hematology and Oncology

## 2022-07-11 DIAGNOSIS — Z510 Encounter for antineoplastic radiation therapy: Secondary | ICD-10-CM | POA: Diagnosis not present

## 2022-07-12 ENCOUNTER — Inpatient Hospital Stay: Payer: Medicare HMO

## 2022-07-12 ENCOUNTER — Inpatient Hospital Stay: Payer: Medicare HMO | Admitting: Hematology and Oncology

## 2022-07-12 ENCOUNTER — Encounter: Payer: Self-pay | Admitting: Hematology and Oncology

## 2022-07-12 ENCOUNTER — Other Ambulatory Visit: Payer: Self-pay

## 2022-07-12 ENCOUNTER — Other Ambulatory Visit: Payer: Self-pay | Admitting: Hematology and Oncology

## 2022-07-12 VITALS — BP 120/66 | HR 98 | Temp 97.7°F | Resp 18 | Ht 70.0 in | Wt 131.2 lb

## 2022-07-12 DIAGNOSIS — C3491 Malignant neoplasm of unspecified part of right bronchus or lung: Secondary | ICD-10-CM

## 2022-07-12 DIAGNOSIS — D509 Iron deficiency anemia, unspecified: Secondary | ICD-10-CM | POA: Diagnosis not present

## 2022-07-12 DIAGNOSIS — K5909 Other constipation: Secondary | ICD-10-CM | POA: Diagnosis not present

## 2022-07-12 DIAGNOSIS — Z7951 Long term (current) use of inhaled steroids: Secondary | ICD-10-CM | POA: Diagnosis not present

## 2022-07-12 DIAGNOSIS — Z5111 Encounter for antineoplastic chemotherapy: Secondary | ICD-10-CM | POA: Diagnosis present

## 2022-07-12 DIAGNOSIS — Z7901 Long term (current) use of anticoagulants: Secondary | ICD-10-CM | POA: Diagnosis not present

## 2022-07-12 DIAGNOSIS — Z79899 Other long term (current) drug therapy: Secondary | ICD-10-CM | POA: Diagnosis not present

## 2022-07-12 DIAGNOSIS — C3490 Malignant neoplasm of unspecified part of unspecified bronchus or lung: Secondary | ICD-10-CM | POA: Diagnosis present

## 2022-07-12 LAB — CMP (CANCER CENTER ONLY)
ALT: 14 U/L (ref 0–44)
AST: 18 U/L (ref 15–41)
Albumin: 3 g/dL — ABNORMAL LOW (ref 3.5–5.0)
Alkaline Phosphatase: 66 U/L (ref 38–126)
Anion gap: 6 (ref 5–15)
BUN: 12 mg/dL (ref 8–23)
CO2: 27 mmol/L (ref 22–32)
Calcium: 8.8 mg/dL — ABNORMAL LOW (ref 8.9–10.3)
Chloride: 103 mmol/L (ref 98–111)
Creatinine: 0.85 mg/dL (ref 0.61–1.24)
GFR, Estimated: >60 mL/min (ref >60)
Glucose, Bld: 130 mg/dL — ABNORMAL HIGH (ref 70–99)
Potassium: 4.1 mmol/L (ref 3.5–5.1)
Sodium: 136 mmol/L (ref 135–145)
Total Bilirubin: 0.5 mg/dL (ref 0.3–1.2)
Total Protein: 7.6 g/dL (ref 6.5–8.1)

## 2022-07-12 LAB — CBC WITH DIFFERENTIAL (CANCER CENTER ONLY)
Abs Immature Granulocytes: 0.03 10*3/uL (ref 0.00–0.07)
Basophils Absolute: 0 10*3/uL (ref 0.0–0.1)
Basophils Relative: 0 %
Eosinophils Absolute: 0.1 10*3/uL (ref 0.0–0.5)
Eosinophils Relative: 1 %
HCT: 34.5 % — ABNORMAL LOW (ref 39.0–52.0)
Hemoglobin: 10.9 g/dL — ABNORMAL LOW (ref 13.0–17.0)
Immature Granulocytes: 0 %
Lymphocytes Relative: 14 %
Lymphs Abs: 1.2 10*3/uL (ref 0.7–4.0)
MCH: 25.1 pg — ABNORMAL LOW (ref 26.0–34.0)
MCHC: 31.6 g/dL (ref 30.0–36.0)
MCV: 79.3 fL — ABNORMAL LOW (ref 80.0–100.0)
Monocytes Absolute: 1.3 10*3/uL — ABNORMAL HIGH (ref 0.1–1.0)
Monocytes Relative: 15 %
Neutro Abs: 6 10*3/uL (ref 1.7–7.7)
Neutrophils Relative %: 70 %
Platelet Count: 299 10*3/uL (ref 150–400)
RBC: 4.35 MIL/uL (ref 4.22–5.81)
RDW: 19.9 % — ABNORMAL HIGH (ref 11.5–15.5)
WBC Count: 8.5 10*3/uL (ref 4.0–10.5)
nRBC: 0 % (ref 0.0–0.2)

## 2022-07-12 MED ORDER — ONDANSETRON HCL 8 MG PO TABS: 8.0000 mg | ORAL_TABLET | Freq: Three times a day (TID) | ORAL | 3 refills | Status: AC | PRN

## 2022-07-12 MED ORDER — LIDOCAINE-PRILOCAINE 2.5-2.5 % EX CREA
TOPICAL_CREAM | CUTANEOUS | 3 refills | Status: DC
Start: 2022-07-12 — End: 2022-08-26

## 2022-07-12 MED ORDER — SENNOSIDES-DOCUSATE SODIUM 8.6-50 MG PO TABS: 2.0000 | ORAL_TABLET | Freq: Every day | ORAL | 1 refills | Status: AC

## 2022-07-12 MED ORDER — PROCHLORPERAZINE MALEATE 10 MG PO TABS
10.0000 mg | ORAL_TABLET | Freq: Four times a day (QID) | ORAL | 1 refills | Status: DC | PRN
Start: 2022-07-12 — End: 2022-08-26

## 2022-07-12 MED FILL — Dexamethasone Sodium Phosphate Inj 100 MG/10ML: INTRAMUSCULAR | Qty: 1 | Status: AC

## 2022-07-12 NOTE — Progress Notes (Signed)
[facility] OFFICE PROGRESS NOTE  Patient Care Team: [name], [name], [name], [name]  ASSESSMENT & PLAN:  Non-small cell lung cancer [facility]) We discussed the role of concurrent chemoradiation therapy There is insufficient tissue to add molecular testing I recommend foundation One testing, result is pending We discussed the role of concurrent chemoradiation therapy  Discussed the risk, benefits, side effects of combination of carboplatin and paclitaxel and Benjamin Cook agreed to proceed, in the future, to be followed by future consolidation treatment with durvalumab The timing of the imaging study would be in about 2 to 3 months  Anemia, iron deficiency I plan to add IV iron along with her chemotherapy on week 2 and week 3 We will get insurance authorization  Other constipation I recommend regular laxatives   No orders of the defined types were placed in this encounter.   All questions were answered. The patient knows to call the clinic with any problems, questions or concerns. The total time spent in the appointment was 30 minutes encounter with patients including review of chart and various tests results, discussions about plan of care and coordination of care plan   Artis Delay, MD 07/12/2022 2:31 PM  INTERVAL HISTORY: Please see below for problem oriented charting. Benjamin Cook returns for treatment follow-up with his wife We discussed side effects of treatment to be expected Benjamin Cook had successful port placement Benjamin Cook complained of constipation  REVIEW OF SYSTEMS:   Constitutional: Denies fevers, chills or abnormal weight loss Eyes: Denies blurriness of vision Ears, nose, mouth, throat, and face: Denies mucositis or sore throat Respiratory: Denies cough, dyspnea or wheezes Cardiovascular: Denies palpitation, chest discomfort or lower extremity  swelling Gastrointestinal:  Denies nausea, heartburn or change in bowel habits Skin: Denies abnormal skin rashes Lymphatics: Denies new lymphadenopathy or easy bruising Neurological:Denies numbness, tingling or new weaknesses Behavioral/Psych: Mood is stable, no new changes  All other systems were reviewed with the patient and are negative.  I have reviewed the past medical history, past surgical history, social history and family history with the patient and they are unchanged from previous note.  ALLERGIES:  has No Known Allergies.  MEDICATIONS:  Current Outpatient Medications  Medication Sig Dispense Refill   ondansetron (ZOFRAN) 8 MG tablet Take 1 tablet (8 mg total) by mouth every 8 (eight) hours as needed for nausea. 30 tablet 3   senna-docusate (SENOKOT-S) 8.6-50 MG tablet Take 2 tablets by mouth daily. 60 tablet 1   acetaminophen (TYLENOL) 500 MG tablet Take 500-1,000 mg by mouth every 8 (eight) hours as needed (for pain).      amiodarone (PACERONE) 400 MG tablet Take 0.5 tablets (200 mg total) by mouth 2 (two) times daily. 28 tablet 0   atorvastatin (LIPITOR) 80 MG tablet TAKE 1 TABLET BY MOUTH DAILY AT 6 PM (Patient taking differently: Take 80 mg by mouth daily. TAKE 1 TABLET BY MOUTH  DAILY AT 6 PM) 90 tablet 3   isosorbide mononitrate (IMDUR) 30 MG 24 hr tablet TAKE 1/2 OF A TABLET (15 MG TOTAL) BY MOUTH DAILY (Patient taking differently: Take 30 mg by mouth daily.) 45 tablet 3   lidocaine-prilocaine (EMLA) cream Apply to affected area once 30 g 3   melatonin 5 MG TABS Take 1 tablet (5 mg total) by mouth at bedtime. 30 tablet 0   metoprolol succinate (TOPROL-XL) 50 MG 24  hr tablet Take 1 tablet (50 mg total) by mouth 2 (two) times daily. Take with or immediately following a meal. 30 tablet 3   mometasone-formoterol (DULERA) 100-5 MCG/ACT AERO Inhale 2 puffs into the lungs in the morning and at bedtime. 8.8 each 1   Multiple Vitamin (MULTIVITAMIN) capsule Take 1 capsule by mouth  daily.     pantoprazole (PROTONIX) 40 MG tablet Take 40 mg by mouth daily.     potassium chloride SA (KLOR-CON M20) 20 MEQ tablet TAKE 1 TABLET (20 MEQ TOTAL) BY MOUTH DAILY. PLEASE SCHEDULE APPOINTMENT FOR ADDITIONAL REFILLS. (Patient taking differently: Take 20 mEq by mouth daily.) 90 tablet 2   prochlorperazine (COMPAZINE) 10 MG tablet Take 1 tablet (10 mg total) by mouth every 6 (six) hours as needed for nausea or vomiting. 30 tablet 1   rivaroxaban (XARELTO) 20 MG TABS tablet Take 1 tablet (20 mg total) by mouth daily with supper. 30 tablet 3   sacubitril-valsartan (ENTRESTO) 97-103 MG TAKE 1 TABLET BY MOUTH TWICE A DAY (Patient taking differently: Take 1 tablet by mouth 2 (two) times daily.) 180 tablet 3   spironolactone (ALDACTONE) 25 MG tablet Take 1 tablet (25 mg total) by mouth daily. 30 tablet 3   No current facility-administered medications for this visit.    SUMMARY OF ONCOLOGIC HISTORY: Oncology History  Non-small cell lung cancer ([name])  06/11/2022 Initial Diagnosis   Non-small cell lung cancer ([name])   06/20/2022 Pathology Results   CYTOLOGY - NON PAP  CASE: [id number]  PATIENT: Benjamin Cook  Non-Gynecological Cytology Report   Clinical History: Right hilar mass  Specimen Submitted:  A. LUNG, RIGHT HILAR MASS, FINE NEEDLE ASPIRATION:    FINAL MICROSCOPIC DIAGNOSIS:  - Malignant cells consistent with non-small cell carcinoma  - See comment   SPECIMEN ADEQUACY:  Satisfactory for evaluation   DIAGNOSTIC COMMENTS:  The specimen contains atypical cells with malignant appearance characterized by nuclear enlargement, pleomorphism, clumped chromatin and occasional nucleoli. Immunohistochemical stains were attempted however no cells were present on the stains. The morphology does not support a small cell carcinoma with differential diagnosis including adenocarcinoma, large cell neuroendocrine carcinoma, or large cell carcinoma, et al. More diagnostic material is needed for  further classification and molecular tests.    07/02/2022 PET scan   1. Right suprahilar hypermetabolic mass, consistent with primary lung malignancy. 2. Subcarinal hypermetabolic activity which is contiguous with right hilar mass, possibly due to mediastinal extension or subcarinal lymphadenopathy. 3. No evidence of metastatic disease in the abdomen or pelvis. 4. Right adrenal gland mass with low-level FDG uptake, unchanged when compared with prior exams dating back to 2018 and consistent with benign adenoma. 5. Hypermetabolic right paraspinal consolidation, likely infectious or inflammatory. 6. Aortic Atherosclerosis (ICD10-I70.0) and Emphysema (ICD10-J43.9).     07/02/2022 Cancer Staging   Staging form: Lung, AJCC 8th Edition - Clinical stage from 07/02/2022: Stage IIIA (cT2, cN2, cM0) - Signed by Artis Delay, MD on 07/02/2022 Stage prefix: Initial diagnosis   07/09/2022 Procedure   Successful placement of a right internal jugular approach power injectable Port-A-Cath. The catheter is ready for immediate use.     07/15/2022 -  Chemotherapy   Patient is on Treatment Plan : LUNG Carboplatin + Paclitaxel + XRT q7d       PHYSICAL EXAMINATION: ECOG PERFORMANCE STATUS: 1 - Symptomatic but completely ambulatory  Vitals:   07/12/22 1358  BP: 120/66  Pulse: 98  Resp: 18  Temp: 97.7 F (36.5 C)  SpO2: 100%  Filed Weights   07/12/22 1358  Weight: 131 lb 3.2 oz (59.5 kg)    GENERAL:alert, no distress and comfortable NEURO: alert & oriented x 3 with fluent speech, no focal motor/sensory deficits  LABORATORY DATA:  I have reviewed the data as listed    Component Value Date/Time   NA 136 07/12/2022 1333   NA 138 04/15/2022 0913   K 4.1 07/12/2022 1333   CL 103 07/12/2022 1333   CO2 27 07/12/2022 1333   GLUCOSE 130 (H) 07/12/2022 1333   BUN 12 07/12/2022 1333   BUN 7 (L) 04/15/2022 0913   CREATININE 0.85 07/12/2022 1333   CREATININE 0.88 09/15/2015 0959   CALCIUM 8.8 (L)  07/12/2022 1333   PROT 7.6 07/12/2022 1333   PROT 6.8 11/07/2020 1303   ALBUMIN 3.0 (L) 07/12/2022 1333   ALBUMIN 3.6 (L) 11/07/2020 1303   AST 18 07/12/2022 1333   ALT 14 07/12/2022 1333   ALKPHOS 66 07/12/2022 1333   BILITOT 0.5 07/12/2022 1333   GFRNONAA >60 07/12/2022 1333   GFRAA 96 03/17/2020 1016    No results found for: "SPEP", "UPEP"  Lab Results  Component Value Date   WBC 8.5 07/12/2022   NEUTROABS 6.0 07/12/2022   HGB 10.9 (L) 07/12/2022   HCT 34.5 (L) 07/12/2022   MCV 79.3 (L) 07/12/2022   PLT 299 07/12/2022      Chemistry      Component Value Date/Time   NA 136 07/12/2022 1333   NA 138 04/15/2022 0913   K 4.1 07/12/2022 1333   CL 103 07/12/2022 1333   CO2 27 07/12/2022 1333   BUN 12 07/12/2022 1333   BUN 7 (L) 04/15/2022 0913   CREATININE 0.85 07/12/2022 1333   CREATININE 0.88 09/15/2015 0959      Component Value Date/Time   CALCIUM 8.8 (L) 07/12/2022 1333   ALKPHOS 66 07/12/2022 1333   AST 18 07/12/2022 1333   ALT 14 07/12/2022 1333   BILITOT 0.5 07/12/2022 1333       RADIOGRAPHIC STUDIES: I have personally reviewed the radiological images as listed and agreed with the findings in the report. IR IMAGING GUIDED PORT INSERTION  Result Date: 07/09/2022 INDICATION: Non-small cell lung cancer EXAM: IMPLANTED PORT A CATH PLACEMENT WITH ULTRASOUND AND FLUOROSCOPIC GUIDANCE MEDICATIONS: None ANESTHESIA/SEDATION: Moderate (conscious) sedation was employed during this procedure. A total of Versed 2 mg and Fentanyl 100 mcg was administered intravenously by the radiology nurse. Total intra-service moderate Sedation Time: 18 minutes. The patient's level of consciousness and vital signs were monitored continuously by radiology nursing throughout the procedure under my direct supervision. FLUOROSCOPY: Radiation Exposure Index (as provided by the fluoroscopic device): 3 mGy Kerma COMPLICATIONS: None immediate. PROCEDURE: The procedure, risks, benefits, and  alternatives were explained to the patient. Questions regarding the procedure were encouraged and answered. The patient understands and consents to the procedure. A timeout was performed prior to the initiation of the procedure. Patient positioned supine on the angiography table. Right neck and anterior upper chest prepped and draped in the usual sterile fashion. All elements of maximal sterile barrier were utilized including, cap, mask, sterile gown, sterile gloves, large sterile drape, hand scrubbing and 2% Chlorhexidine for skin cleaning. The right internal jugular vein was evaluated with ultrasound and shown to be patent. A permanent ultrasound image was obtained and placed in the patient's medical record. Local anesthesia was provided with 1% lidocaine with epinephrine. Using sterile gel and a sterile probe cover, the right internal jugular vein was  entered with a 21 ga needle during real time ultrasound guidance. 0.018 inch guidewire placed and 21 ga needle exchanged for transitional dilator set. Utilizing fluoroscopy, 0.035 inch guidewire advanced centrally without difficulty. Attention then turned to the right anterior upper chest. Following local lidocaine administration, a port pocket was created. The catheter was connected to the port and brought from the pocket to the venotomy site through a subcutaneous tunnel. The catheter was cut to size and inserted through the peel-away sheath. The catheter tip was positioned at the cavoatrial junction using fluoroscopic guidance. The port aspirated and flushed well. The port pocket was closed with deep and superficial absorbable suture. The port pocket incision and venotomy sites were also sealed with Dermabond. IMPRESSION: Successful placement of a right internal jugular approach power injectable Port-A-Cath. The catheter is ready for immediate use. Electronically Signed   By: Acquanetta Belling M.D.   On: 07/09/2022 14:37   NM PET Image Initial (PI) Skull Base To  Thigh  Result Date: 07/02/2022 CLINICAL DATA:  Initial treatment strategy for lung cancer. EXAM: NUCLEAR MEDICINE PET SKULL BASE TO THIGH TECHNIQUE: 6.5 mCi F-18 FDG was injected intravenously. Full-ring PET imaging was performed from the skull base to thigh after the radiotracer. CT data was obtained and used for attenuation correction and anatomic localization. Fasting blood glucose: 107 mg/dl COMPARISON:  Chest CT dated June 03, 2022; CT abdomen and pelvis dated November 15, 2016 FINDINGS: Mediastinal blood pool activity: SUV max 2.0 Liver activity: SUV max NA NECK: No hypermetabolic lymph nodes in the neck. Incidental CT findings: None. CHEST: Large right suprahilar hypermetabolic mass, SUV max of 23.4. Subcarinal hypermetabolic activity which is contiguous with right hilar mass seen on series 604, image 72, possibly due to mediastinal extension or subcarinal lymphadenopathy. No other hypermetabolic lymph nodes seen in the chest. Hypermetabolic right paraspinal consolidation, likely infectious or inflammatory. Incidental CT findings: Moderate centrilobular emphysema. Bilateral bronchial wall thickening and scattered areas of mucous plugging. ABDOMEN/PELVIS: No abnormal hypermetabolic activity within the liver, pancreas, adrenal glands, or spleen. No hypermetabolic lymph nodes in the abdomen or pelvis. Incidental CT findings: Atherosclerotic disease of the abdominal aorta. Abdominal aortic aneurysm status post endovascular repair. Right adrenal gland mass measuring up to 3.5 cm with low-level FDG uptake. Prostatomegaly. SKELETON: No focal hypermetabolic activity to suggest skeletal metastasis. Incidental CT findings: None. IMPRESSION: 1. Right suprahilar hypermetabolic mass, consistent with primary lung malignancy. 2. Subcarinal hypermetabolic activity which is contiguous with right hilar mass, possibly due to mediastinal extension or subcarinal lymphadenopathy. 3. No evidence of metastatic disease in the  abdomen or pelvis. 4. Right adrenal gland mass with low-level FDG uptake, unchanged when compared with prior exams dating back to 2018 and consistent with benign adenoma. 5. Hypermetabolic right paraspinal consolidation, likely infectious or inflammatory. 6. Aortic Atherosclerosis (ICD10-I70.0) and Emphysema (ICD10-J43.9). Electronically Signed   By: Allegra Lai M.D.   On: 07/02/2022 10:46   DG Chest Port 1 View  Result Date: 06/24/2022 CLINICAL DATA:  77 year old male with congestive heart failure EXAM: PORTABLE CHEST 1 VIEW COMPARISON:  06/02/2022 FINDINGS: Cardiac diameter unchanged. Left chest wall pacing device/AICD, unchanged. Opacity in the right hilar region, compatible with known malignancy. No pneumothorax.  No pleural effusion. No interlobular septal thickening. Coarsened interstitial markings similar to the prior. Peripheral nodular changes of the right upper lung similar to the comparison and better characterized on recent chest CT imaging. No new confluent airspace disease. IMPRESSION: Negative for acute cardiopulmonary disease. Right hilar opacity, compatible with known  malignancy. Electronically Signed   By: Gilmer Mor D.O.   On: 06/24/2022 09:15

## 2022-07-12 NOTE — Assessment & Plan Note (Signed)
I plan to add IV iron along with her chemotherapy on week 2 and week 3 We will get insurance authorization

## 2022-07-12 NOTE — Assessment & Plan Note (Signed)
We discussed the role of concurrent chemoradiation therapy There is insufficient tissue to add molecular testing I recommend foundation One testing, result is pending We discussed the role of concurrent chemoradiation therapy  Discussed the risk, benefits, side effects of combination of carboplatin and paclitaxel and he agreed to proceed, in the future, to be followed by future consolidation treatment with durvalumab The timing of the imaging study would be in about 2 to 3 months

## 2022-07-12 NOTE — Assessment & Plan Note (Signed)
I recommend regular laxatives 

## 2022-07-15 ENCOUNTER — Other Ambulatory Visit: Payer: Self-pay | Admitting: Hematology and Oncology

## 2022-07-15 ENCOUNTER — Ambulatory Visit
Admission: RE | Admit: 2022-07-15 | Discharge: 2022-07-15 | Disposition: A | Discharge status: Home / Self Care | Payer: Medicare HMO | Source: Ambulatory Visit | Attending: Radiation Oncology | Admitting: Radiation Oncology

## 2022-07-15 ENCOUNTER — Inpatient Hospital Stay: Payer: Medicare HMO

## 2022-07-15 ENCOUNTER — Other Ambulatory Visit: Payer: Self-pay | Admitting: Internal Medicine

## 2022-07-15 ENCOUNTER — Other Ambulatory Visit: Payer: Self-pay

## 2022-07-15 VITALS — BP 106/50 | HR 63 | Temp 98.1°F | Resp 14 | Ht 70.0 in | Wt 131.0 lb

## 2022-07-15 DIAGNOSIS — Z5111 Encounter for antineoplastic chemotherapy: Secondary | ICD-10-CM | POA: Diagnosis not present

## 2022-07-15 DIAGNOSIS — C3491 Malignant neoplasm of unspecified part of right bronchus or lung: Secondary | ICD-10-CM

## 2022-07-15 DIAGNOSIS — Z510 Encounter for antineoplastic radiation therapy: Secondary | ICD-10-CM | POA: Diagnosis not present

## 2022-07-15 DIAGNOSIS — I480 Paroxysmal atrial fibrillation: Secondary | ICD-10-CM

## 2022-07-15 LAB — RAD ONC ARIA SESSION SUMMARY
Course Elapsed Days: 0
Plan Fractions Treated to Date: 1
Plan Prescribed Dose Per Fraction: 2 Gy
Plan Total Fractions Prescribed: 30
Plan Total Prescribed Dose: 60 Gy
Reference Point Dosage Given to Date: 2 Gy
Reference Point Session Dosage Given: 2 Gy
Session Number: 1

## 2022-07-15 LAB — FOUNDATIONACT

## 2022-07-15 MED ORDER — SODIUM CHLORIDE 0.9 % IV SOLN
45.0000 mg/m2 | Freq: Once | INTRAVENOUS | Status: AC
  Administered 2022-07-15: 78 mg via INTRAVENOUS
  Filled 2022-07-15: qty 13

## 2022-07-15 MED ORDER — SODIUM CHLORIDE 0.9 % IV SOLN: Freq: Once | INTRAVENOUS | Status: AC

## 2022-07-15 MED ORDER — SODIUM CHLORIDE 0.9 % IV SOLN
151.8000 mg | Freq: Once | INTRAVENOUS | Status: AC
  Administered 2022-07-15: 150 mg via INTRAVENOUS
  Filled 2022-07-15: qty 15

## 2022-07-15 MED ORDER — PALONOSETRON HCL INJECTION 0.25 MG/5ML
0.2500 mg | Freq: Once | INTRAVENOUS | Status: AC
  Administered 2022-07-15: 0.25 mg via INTRAVENOUS
  Filled 2022-07-15: qty 5

## 2022-07-15 MED ORDER — HEPARIN SOD (PORK) LOCK FLUSH 100 UNIT/ML IV SOLN
500.0000 [IU] | Freq: Once | INTRAVENOUS | Status: AC | PRN
  Administered 2022-07-15: 500 [IU]

## 2022-07-15 MED ORDER — SODIUM CHLORIDE 0.9% FLUSH
10.0000 mL | INTRAVENOUS | Status: DC | PRN
  Administered 2022-07-15: 10 mL

## 2022-07-15 MED ORDER — FAMOTIDINE IN NACL 20-0.9 MG/50ML-% IV SOLN
20.0000 mg | Freq: Once | INTRAVENOUS | Status: AC
  Administered 2022-07-15: 20 mg via INTRAVENOUS
  Filled 2022-07-15: qty 50

## 2022-07-15 MED ORDER — DIPHENHYDRAMINE HCL 50 MG/ML IJ SOLN
50.0000 mg | Freq: Once | INTRAMUSCULAR | Status: AC
  Administered 2022-07-15: 50 mg via INTRAVENOUS
  Filled 2022-07-15: qty 1

## 2022-07-15 MED ORDER — SODIUM CHLORIDE 0.9 % IV SOLN
10.0000 mg | Freq: Once | INTRAVENOUS | Status: AC
  Administered 2022-07-15: 10 mg via INTRAVENOUS
  Filled 2022-07-15: qty 10

## 2022-07-15 NOTE — Patient Instructions (Signed)
[facility]  Discharge Instructions: Thank you for choosing [facility] to provide your oncology and hematology care.   If you have a lab appointment with the Cancer Center, please go directly to the Cancer Center and check in at the registration area.   Wear comfortable clothing and clothing appropriate for easy access to any Portacath or PICC line.   We strive to give you quality time with your provider. You may need to reschedule your appointment if you arrive late (15 or more minutes).  Arriving late affects you and other patients whose appointments are after yours.  Also, if you miss three or more appointments without notifying the office, you may be dismissed from the clinic at the provider's discretion.      For prescription refill requests, have your pharmacy contact our office and allow 72 hours for refills to be completed.    Today you received the following chemotherapy and/or immunotherapy agents: Paclitaxel (Taxol) & Carboplatin      To help prevent nausea and vomiting after your treatment, we encourage you to take your nausea medication as directed.  BELOW ARE SYMPTOMS THAT SHOULD BE REPORTED IMMEDIATELY: *FEVER GREATER THAN 100.4 F (38 C) OR HIGHER *CHILLS OR SWEATING *NAUSEA AND VOMITING THAT IS NOT CONTROLLED WITH YOUR NAUSEA MEDICATION *UNUSUAL SHORTNESS OF BREATH *UNUSUAL BRUISING OR BLEEDING *URINARY PROBLEMS (pain or burning when urinating, or frequent urination) *BOWEL PROBLEMS (unusual diarrhea, constipation, pain near the anus) TENDERNESS IN MOUTH AND THROAT WITH OR WITHOUT PRESENCE OF ULCERS (sore throat, sores in mouth, or a toothache) UNUSUAL RASH, SWELLING OR PAIN  UNUSUAL VAGINAL DISCHARGE OR ITCHING   Items with * indicate a potential emergency and should be followed up as soon as possible or go to the Emergency Department if any problems should occur.  Please show the CHEMOTHERAPY ALERT CARD or  IMMUNOTHERAPY ALERT CARD at check-in to the Emergency Department and triage nurse.  Should you have questions after your visit or need to cancel or reschedule your appointment, please contact [facility]  Dept: [phone]  and follow the prompts.  Office hours are 8:00 a.m. to 4:30 p.m. Monday - Friday. Please note that voicemails left after 4:00 p.m. may not be returned until the following business day.  We are closed weekends and major holidays. You have access to a nurse at all times for urgent questions. Please call the main number to the clinic Dept: [phone] and follow the prompts.   For any non-urgent questions, you may also contact your provider using MyChart. We now offer e-Visits for anyone 18 and older to request care online for non-urgent symptoms. For details visit [email or web address].   Also download the MyChart app! Go to the app store, search "MyChart", open the app, select [facility], and log in with your MyChart username and password.   

## 2022-07-16 ENCOUNTER — Encounter (HOSPITAL_COMMUNITY): Payer: Self-pay | Admitting: Hematology and Oncology

## 2022-07-16 ENCOUNTER — Ambulatory Visit
Admission: RE | Admit: 2022-07-16 | Discharge: 2022-07-16 | Disposition: A | Discharge status: Home / Self Care | Payer: Medicare HMO | Source: Ambulatory Visit | Attending: Radiation Oncology | Admitting: Radiation Oncology

## 2022-07-16 ENCOUNTER — Encounter: Payer: Self-pay | Admitting: Hematology and Oncology

## 2022-07-16 ENCOUNTER — Telehealth: Payer: Self-pay

## 2022-07-16 ENCOUNTER — Other Ambulatory Visit: Payer: Self-pay

## 2022-07-16 DIAGNOSIS — Z510 Encounter for antineoplastic radiation therapy: Secondary | ICD-10-CM | POA: Diagnosis not present

## 2022-07-16 LAB — RAD ONC ARIA SESSION SUMMARY
Course Elapsed Days: 1
Plan Fractions Treated to Date: 2
Plan Prescribed Dose Per Fraction: 2 Gy
Plan Total Fractions Prescribed: 30
Plan Total Prescribed Dose: 60 Gy
Reference Point Dosage Given to Date: 4 Gy
Reference Point Session Dosage Given: 2 Gy
Session Number: 2

## 2022-07-16 NOTE — Telephone Encounter (Signed)
The patient wife states he felt dizzy yesterday between 3&4 pm. She wants to know if anything showed up on his transmission. I helped her send a transmission with his home remote monitor.

## 2022-07-16 NOTE — Telephone Encounter (Signed)
-----   Message from Marylouise Stacks, RN sent at 07/15/2022  3:23 PM EDT ----- Regarding: Dr. Bertis Ruddy 1st time Taxol/Carbo f/u tol well Dr. Bertis Ruddy 1st time Taxol/Carbo, Pt call back due. Pt tolerated tx well without incident.

## 2022-07-16 NOTE — Progress Notes (Signed)
Received call from patient's spouse after receiving my card per my request.  Introduced myself as Dance movement psychotherapist and to offer available resources. Discussed one-time $1000 Marketing executive to assist with personal expenses while going through treatment. Advised what is needed to apply and asked if they can provide tomorrow 6/12. She states they can. Advised to provide to registration staff and they will scan and email to me. Discussed gas cards and how they are disbursed. They will be given grant paperwork to complete and green folder with expense sheet of other expenses. Advised to contact me at earliest convenience to discuss expense sheet in detail. She verbalized understanding.  They have my card to do so and for any additional financial questions or concerns.

## 2022-07-16 NOTE — Telephone Encounter (Signed)
Called & spoke to pt's wife regarding how he did with treatment yesterday.  She reports that she thinks he is doing well but got out on lawn mower yesterday & maybe overdid & didn't feel well after a while.  He drove himself to radiation today.  She reports knowing how to reach [place] & knows next appts.

## 2022-07-16 NOTE — Telephone Encounter (Signed)
Patient to patients wife who advises patient had his first chemo treatment yesterday, went home and tried to get out to do stuff in the heat. Advised his transmission did not show any episodes but he needs to rest as to much activity post 1st chemo treatment may have attributed. States she called and told the staff who gave the chemo. Advised I hoped pt felt better and assure he gets rest.

## 2022-07-17 ENCOUNTER — Ambulatory Visit: Payer: Medicare HMO | Admitting: Vascular Surgery

## 2022-07-17 ENCOUNTER — Ambulatory Visit
Admission: RE | Admit: 2022-07-17 | Discharge: 2022-07-17 | Disposition: A | Discharge status: Home / Self Care | Payer: Medicare HMO | Source: Ambulatory Visit | Attending: Radiation Oncology | Admitting: Radiation Oncology

## 2022-07-17 ENCOUNTER — Encounter: Payer: Self-pay | Admitting: Hematology and Oncology

## 2022-07-17 ENCOUNTER — Other Ambulatory Visit: Payer: Self-pay

## 2022-07-17 DIAGNOSIS — Z510 Encounter for antineoplastic radiation therapy: Secondary | ICD-10-CM | POA: Diagnosis not present

## 2022-07-17 LAB — RAD ONC ARIA SESSION SUMMARY
Course Elapsed Days: 2
Plan Fractions Treated to Date: 3
Plan Prescribed Dose Per Fraction: 2 Gy
Plan Total Fractions Prescribed: 30
Plan Total Prescribed Dose: 60 Gy
Reference Point Dosage Given to Date: 6 Gy
Reference Point Session Dosage Given: 2 Gy
Session Number: 3

## 2022-07-17 NOTE — Progress Notes (Signed)
Patient provided documentation for grant at check-in today.  Patient approved for one-time $1000 [employer] grant to assist with personal expenses while going through treatment. Patient signed grant paperwork and was given a copy of the approval letter and expense sheet in green folder. He received a gas card today from his grant. They were advised to contact me at earliest convenience to discuss expenses in detail.  They have my card to do so and for any additional financial questions or concerns.

## 2022-07-18 ENCOUNTER — Ambulatory Visit
Admission: RE | Admit: 2022-07-18 | Discharge: 2022-07-18 | Disposition: A | Discharge status: Home / Self Care | Payer: Medicare HMO | Source: Ambulatory Visit | Attending: Radiation Oncology | Admitting: Radiation Oncology

## 2022-07-18 ENCOUNTER — Other Ambulatory Visit: Payer: Self-pay

## 2022-07-18 DIAGNOSIS — Z510 Encounter for antineoplastic radiation therapy: Secondary | ICD-10-CM | POA: Diagnosis not present

## 2022-07-18 LAB — RAD ONC ARIA SESSION SUMMARY
Course Elapsed Days: 3
Plan Fractions Treated to Date: 4
Plan Prescribed Dose Per Fraction: 2 Gy
Plan Total Fractions Prescribed: 30
Plan Total Prescribed Dose: 60 Gy
Reference Point Dosage Given to Date: 8 Gy
Reference Point Session Dosage Given: 2 Gy
Session Number: 4

## 2022-07-19 ENCOUNTER — Ambulatory Visit
Admission: RE | Admit: 2022-07-19 | Discharge: 2022-07-19 | Disposition: A | Discharge status: Home / Self Care | Payer: Medicare HMO | Source: Ambulatory Visit | Attending: Radiation Oncology | Admitting: Radiation Oncology

## 2022-07-19 ENCOUNTER — Other Ambulatory Visit: Payer: Self-pay

## 2022-07-19 DIAGNOSIS — Z510 Encounter for antineoplastic radiation therapy: Secondary | ICD-10-CM | POA: Diagnosis not present

## 2022-07-19 LAB — RAD ONC ARIA SESSION SUMMARY
Course Elapsed Days: 4
Plan Fractions Treated to Date: 5
Plan Prescribed Dose Per Fraction: 2 Gy
Plan Total Fractions Prescribed: 30
Plan Total Prescribed Dose: 60 Gy
Reference Point Dosage Given to Date: 10 Gy
Reference Point Session Dosage Given: 2 Gy
Session Number: 5

## 2022-07-19 MED FILL — Dexamethasone Sodium Phosphate Inj 100 MG/10ML: INTRAMUSCULAR | Qty: 1 | Status: AC

## 2022-07-22 ENCOUNTER — Inpatient Hospital Stay: Payer: Medicare HMO

## 2022-07-22 ENCOUNTER — Ambulatory Visit
Admission: RE | Admit: 2022-07-22 | Discharge: 2022-07-22 | Disposition: A | Discharge status: Home / Self Care | Payer: Medicare HMO | Source: Ambulatory Visit | Attending: Radiation Oncology | Admitting: Radiation Oncology

## 2022-07-22 ENCOUNTER — Encounter: Payer: Self-pay | Admitting: Hematology and Oncology

## 2022-07-22 ENCOUNTER — Other Ambulatory Visit: Payer: Self-pay

## 2022-07-22 ENCOUNTER — Inpatient Hospital Stay (HOSPITAL_BASED_OUTPATIENT_CLINIC_OR_DEPARTMENT_OTHER): Payer: Medicare HMO | Admitting: Hematology and Oncology

## 2022-07-22 VITALS — BP 116/53 | HR 70 | Resp 18

## 2022-07-22 VITALS — BP 134/56 | HR 77 | Temp 97.4°F | Resp 18 | Ht 70.0 in | Wt 133.2 lb

## 2022-07-22 DIAGNOSIS — D509 Iron deficiency anemia, unspecified: Secondary | ICD-10-CM

## 2022-07-22 DIAGNOSIS — K5909 Other constipation: Secondary | ICD-10-CM

## 2022-07-22 DIAGNOSIS — C3491 Malignant neoplasm of unspecified part of right bronchus or lung: Secondary | ICD-10-CM

## 2022-07-22 DIAGNOSIS — Z5111 Encounter for antineoplastic chemotherapy: Secondary | ICD-10-CM | POA: Diagnosis not present

## 2022-07-22 DIAGNOSIS — Z510 Encounter for antineoplastic radiation therapy: Secondary | ICD-10-CM | POA: Diagnosis not present

## 2022-07-22 DIAGNOSIS — R634 Abnormal weight loss: Secondary | ICD-10-CM

## 2022-07-22 LAB — CMP (CANCER CENTER ONLY)
ALT: 24 U/L (ref 0–44)
AST: 26 U/L (ref 15–41)
Albumin: 2.7 g/dL — ABNORMAL LOW (ref 3.5–5.0)
Alkaline Phosphatase: 75 U/L (ref 38–126)
Anion gap: 5 (ref 5–15)
BUN: 10 mg/dL (ref 8–23)
CO2: 26 mmol/L (ref 22–32)
Calcium: 8.5 mg/dL — ABNORMAL LOW (ref 8.9–10.3)
Chloride: 105 mmol/L (ref 98–111)
Creatinine: 0.75 mg/dL (ref 0.61–1.24)
GFR, Estimated: >60 mL/min (ref >60)
Glucose, Bld: 104 mg/dL — ABNORMAL HIGH (ref 70–99)
Potassium: 4.6 mmol/L (ref 3.5–5.1)
Sodium: 136 mmol/L (ref 135–145)
Total Bilirubin: 0.5 mg/dL (ref 0.3–1.2)
Total Protein: 6.6 g/dL (ref 6.5–8.1)

## 2022-07-22 LAB — RAD ONC ARIA SESSION SUMMARY
Course Elapsed Days: 7
Plan Fractions Treated to Date: 6
Plan Prescribed Dose Per Fraction: 2 Gy
Plan Total Fractions Prescribed: 30
Plan Total Prescribed Dose: 60 Gy
Reference Point Dosage Given to Date: 12 Gy
Reference Point Session Dosage Given: 2 Gy
Session Number: 6

## 2022-07-22 LAB — CBC WITH DIFFERENTIAL (CANCER CENTER ONLY)
Abs Immature Granulocytes: 0.07 10*3/uL (ref 0.00–0.07)
Basophils Absolute: 0 10*3/uL (ref 0.0–0.1)
Basophils Relative: 0 %
Eosinophils Absolute: 0.1 10*3/uL (ref 0.0–0.5)
Eosinophils Relative: 1 %
HCT: 29.5 % — ABNORMAL LOW (ref 39.0–52.0)
Hemoglobin: 9.7 g/dL — ABNORMAL LOW (ref 13.0–17.0)
Immature Granulocytes: 1 %
Lymphocytes Relative: 9 %
Lymphs Abs: 0.6 10*3/uL — ABNORMAL LOW (ref 0.7–4.0)
MCH: 26.1 pg (ref 26.0–34.0)
MCHC: 32.9 g/dL (ref 30.0–36.0)
MCV: 79.5 fL — ABNORMAL LOW (ref 80.0–100.0)
Monocytes Absolute: 0.4 10*3/uL (ref 0.1–1.0)
Monocytes Relative: 7 %
Neutro Abs: 5.4 10*3/uL (ref 1.7–7.7)
Neutrophils Relative %: 82 %
Platelet Count: 256 10*3/uL (ref 150–400)
RBC: 3.71 MIL/uL — ABNORMAL LOW (ref 4.22–5.81)
RDW: 19.9 % — ABNORMAL HIGH (ref 11.5–15.5)
WBC Count: 6.6 10*3/uL (ref 4.0–10.5)
nRBC: 0 % (ref 0.0–0.2)

## 2022-07-22 MED ORDER — SODIUM CHLORIDE 0.9% FLUSH
10.0000 mL | INTRAVENOUS | Status: DC | PRN
  Administered 2022-07-22: 10 mL

## 2022-07-22 MED ORDER — HEPARIN SOD (PORK) LOCK FLUSH 100 UNIT/ML IV SOLN
500.0000 [IU] | Freq: Once | INTRAVENOUS | Status: AC | PRN
  Administered 2022-07-22: 500 [IU]

## 2022-07-22 MED ORDER — SODIUM CHLORIDE 0.9 % IV SOLN
151.8000 mg | Freq: Once | INTRAVENOUS | Status: AC
  Administered 2022-07-22: 150 mg via INTRAVENOUS
  Filled 2022-07-22: qty 15

## 2022-07-22 MED ORDER — DIPHENHYDRAMINE HCL 50 MG/ML IJ SOLN
50.0000 mg | Freq: Once | INTRAMUSCULAR | Status: AC
  Administered 2022-07-22: 50 mg via INTRAVENOUS
  Filled 2022-07-22: qty 1

## 2022-07-22 MED ORDER — SODIUM CHLORIDE 0.9 % IV SOLN
45.0000 mg/m2 | Freq: Once | INTRAVENOUS | Status: AC
  Administered 2022-07-22: 78 mg via INTRAVENOUS
  Filled 2022-07-22: qty 13

## 2022-07-22 MED ORDER — FAMOTIDINE IN NACL 20-0.9 MG/50ML-% IV SOLN
20.0000 mg | Freq: Once | INTRAVENOUS | Status: AC
  Administered 2022-07-22: 20 mg via INTRAVENOUS
  Filled 2022-07-22: qty 50

## 2022-07-22 MED ORDER — SODIUM CHLORIDE 0.9 % IV SOLN
10.0000 mg | Freq: Once | INTRAVENOUS | Status: AC
  Administered 2022-07-22: 10 mg via INTRAVENOUS
  Filled 2022-07-22: qty 10

## 2022-07-22 MED ORDER — SODIUM CHLORIDE 0.9% FLUSH
10.0000 mL | Freq: Once | INTRAVENOUS | Status: AC
  Administered 2022-07-22: 10 mL

## 2022-07-22 MED ORDER — PALONOSETRON HCL INJECTION 0.25 MG/5ML
0.2500 mg | Freq: Once | INTRAVENOUS | Status: AC
  Administered 2022-07-22: 0.25 mg via INTRAVENOUS
  Filled 2022-07-22: qty 5

## 2022-07-22 MED ORDER — SODIUM CHLORIDE 0.9 % IV SOLN: Freq: Once | INTRAVENOUS | Status: AC

## 2022-07-22 MED ORDER — SODIUM CHLORIDE 0.9 % IV SOLN
200.0000 mg | Freq: Once | INTRAVENOUS | Status: AC
  Administered 2022-07-22: 200 mg via INTRAVENOUS
  Filled 2022-07-22: qty 200

## 2022-07-22 NOTE — Patient Instructions (Signed)
[facility]  Discharge Instructions: Thank you for choosing [facility] to provide your oncology and hematology care.   If you have a lab appointment with the Cancer Center, please go directly to the Cancer Center and check in at the registration area.   Wear comfortable clothing and clothing appropriate for easy access to any Portacath or PICC line.   We strive to give you quality time with your provider. You may need to reschedule your appointment if you arrive late (15 or more minutes).  Arriving late affects you and other patients whose appointments are after yours.  Also, if you miss three or more appointments without notifying the office, you may be dismissed from the clinic at the provider's discretion.      For prescription refill requests, have your pharmacy contact our office and allow 72 hours for refills to be completed.    Today you received the following chemotherapy and/or immunotherapy agents: Paclitaxel, Carboplatin.       To help prevent nausea and vomiting after your treatment, we encourage you to take your nausea medication as directed.  BELOW ARE SYMPTOMS THAT SHOULD BE REPORTED IMMEDIATELY: *FEVER GREATER THAN 100.4 F (38 C) OR HIGHER *CHILLS OR SWEATING *NAUSEA AND VOMITING THAT IS NOT CONTROLLED WITH YOUR NAUSEA MEDICATION *UNUSUAL SHORTNESS OF BREATH *UNUSUAL BRUISING OR BLEEDING *URINARY PROBLEMS (pain or burning when urinating, or frequent urination) *BOWEL PROBLEMS (unusual diarrhea, constipation, pain near the anus) TENDERNESS IN MOUTH AND THROAT WITH OR WITHOUT PRESENCE OF ULCERS (sore throat, sores in mouth, or a toothache) UNUSUAL RASH, SWELLING OR PAIN  UNUSUAL VAGINAL DISCHARGE OR ITCHING   Items with * indicate a potential emergency and should be followed up as soon as possible or go to the Emergency Department if any problems should occur.  Please show the CHEMOTHERAPY ALERT CARD or IMMUNOTHERAPY  ALERT CARD at check-in to the Emergency Department and triage nurse.  Should you have questions after your visit or need to cancel or reschedule your appointment, please contact [facility]  Dept: [phone]  and follow the prompts.  Office hours are 8:00 a.m. to 4:30 p.m. Monday - Friday. Please note that voicemails left after 4:00 p.m. may not be returned until the following business day.  We are closed weekends and major holidays. You have access to a nurse at all times for urgent questions. Please call the main number to the clinic Dept: [phone] and follow the prompts.   For any non-urgent questions, you may also contact your provider using MyChart. We now offer e-Visits for anyone 18 and older to request care online for non-urgent symptoms. For details visit [email or web address].   Also download the MyChart app! Go to the app store, search "MyChart", open the app, select [facility], and log in with your MyChart username and password.   

## 2022-07-22 NOTE — Progress Notes (Signed)
Nutrition Follow-up:  Patient with newly diagnosed stage III non-small cell lung cancer of right lung.  Patient receiving concurrent chemotherapy and radiation.    Met with patient and family member during infusion.  Reports that appetite is good.  Has been drinking carnation instant breakfast mixed with whole milk each morning.  Breakfast is usually cornflakes and fruit most mornings.  Sometimes will have eggs, bacon, grits, biscuit and hashbrowns. Sometimes skips lunch.  Supper last night was soup.  Recently ate steak, potato and salad.  Tonight having meatloaf, green beans and mashed potatoes.  Likes grilled cheese sandwiches, hot dog and peanut butter and jelly sandwiches.  Denies nausea.      Medications: reviewed  Labs: reviewed  Anthropometrics:   Weight 133 lb 3.2 oz 6/17 130 lb on 6/3 UBW 145 lb (06/02/22)    NUTRITION DIAGNOSIS: Unintentional weight loss improved   INTERVENTION:  Continue carnation instant breakfast shake daily Continue high calorie, high protein foods to maintain weight    MONITORING, EVALUATION, GOAL: weight trends, intake   NEXT VISIT: Monday, July 1 during infusion  Orchid Glassberg B. Freida Busman, RD, LDN Registered Dietitian [phone]

## 2022-07-22 NOTE — Assessment & Plan Note (Signed)
Overall, he tolerated treatment well except for mild intermittent hemoptysis and anemia He will proceed with ongoing radiation therapy and chemotherapy today I will see him again next week for supportive care

## 2022-07-22 NOTE — Assessment & Plan Note (Signed)
The most likely cause of his anemia is due tochronic blood loss/malabsorption syndrome. We discussed some of the risks, benefits, and alternatives of intravenous iron infusions. The patient is symptomatic from anemia and the iron level is critically low. He tolerated oral iron supplement poorly and desires to achieved higher levels of iron faster for adequate hematopoesis. Some of the side-effects to be expected including risks of infusion reactions, phlebitis, headaches, nausea and fatigue.  The patient is willing to proceed. Patient education material was dispensed.  Goal is to keep ferritin level greater than 50 and resolution of anemia  

## 2022-07-22 NOTE — Progress Notes (Signed)
[facility] OFFICE PROGRESS NOTE  Patient Care Team: [name], [name], [name], [name]  ASSESSMENT & PLAN:  Non-small cell lung cancer ([name]) Overall, he tolerated treatment well except for mild intermittent hemoptysis and anemia He will proceed with ongoing radiation therapy and chemotherapy today I will see him again next week for supportive care  Anemia, iron deficiency The most likely cause of his anemia is due to chronic blood loss/malabsorption syndrome. We discussed some of the risks, benefits, and alternatives of intravenous iron infusions. The patient is symptomatic from anemia and the iron level is critically low. He tolerated oral iron supplement poorly and desires to achieved higher levels of iron faster for adequate hematopoesis. Some of the side-effects to be expected including risks of infusion reactions, phlebitis, headaches, nausea and fatigue.  The patient is willing to proceed. Patient education material was dispensed.  Goal is to keep ferritin level greater than 50 and resolution of anemia   Other constipation He had good bowel movement recently I recommend the patient to discontinue laxatives due to recent loose stool  WEIGHT LOSS He is eating better and is gaining weight He will see dietitian today for follow-up  No orders of the defined types were placed in this encounter.   All questions were answered. The patient knows to call the clinic with any problems, questions or concerns. The total time spent in the appointment was 30 minutes encounter with patients including review of chart and various tests results, discussions about plan of care and coordination of care plan   Artis Delay, MD 07/22/2022 12:38 PM  INTERVAL HISTORY: Please see below for problem oriented charting. he returns for treatment follow-up  with his wife His appointment was delayed today He has noticed some mild intermittent hemoptysis over the weekend but not significant amount He tolerated last cycle of therapy well Denies nausea He denies further constipation.  In fact, he had recent loose stool Denies chest pain or shortness of breath He has gained some weight since last time I saw him  REVIEW OF SYSTEMS:   Constitutional: Denies fevers, chills or abnormal weight loss Eyes: Denies blurriness of vision Ears, nose, mouth, throat, and face: Denies mucositis or sore throat Respiratory: Denies cough, dyspnea or wheezes Cardiovascular: Denies palpitation, chest discomfort or lower extremity swelling Gastrointestinal:  Denies nausea, heartburn or change in bowel habits Skin: Denies abnormal skin rashes Lymphatics: Denies new lymphadenopathy or easy bruising Neurological:Denies numbness, tingling or new weaknesses Behavioral/Psych: Mood is stable, no new changes  All other systems were reviewed with the patient and are negative.  I have reviewed the past medical history, past surgical history, social history and family history with the patient and they are unchanged from previous note.  ALLERGIES:  has No Known Allergies.  MEDICATIONS:  Current Outpatient Medications  Medication Sig Dispense Refill   acetaminophen (TYLENOL) 500 MG tablet Take 500-1,000 mg by mouth every 8 (eight) hours as needed (for pain).      amiodarone (PACERONE) 400 MG tablet Take 0.5 tablets (200 mg total) by mouth 2 (two) times daily. 28 tablet 0   atorvastatin (LIPITOR) 80 MG tablet TAKE 1 TABLET BY MOUTH DAILY AT 6 PM (Patient taking differently: Take 80 mg by mouth daily. TAKE 1 TABLET BY MOUTH  DAILY AT 6 PM) 90 tablet 3   isosorbide mononitrate (IMDUR) 30 MG  24 hr tablet TAKE 1/2 OF A TABLET (15 MG TOTAL) BY MOUTH DAILY (Patient taking differently: Take 30 mg by mouth daily.) 45 tablet 3   lidocaine-prilocaine (EMLA) cream Apply to affected area  once 30 g 3   melatonin 5 MG TABS Take 1 tablet (5 mg total) by mouth at bedtime. 30 tablet 0   metoprolol succinate (TOPROL-XL) 50 MG 24 hr tablet Take 1 tablet (50 mg total) by mouth 2 (two) times daily. Take with or immediately following a meal. 30 tablet 3   mometasone-formoterol (DULERA) 100-5 MCG/ACT AERO Inhale 2 puffs into the lungs in the morning and at bedtime. 8.8 each 1   Multiple Vitamin (MULTIVITAMIN) capsule Take 1 capsule by mouth daily.     ondansetron (ZOFRAN) 8 MG tablet Take 1 tablet (8 mg total) by mouth every 8 (eight) hours as needed for nausea. 30 tablet 3   pantoprazole (PROTONIX) 40 MG tablet Take 40 mg by mouth daily.     potassium chloride SA (KLOR-CON M20) 20 MEQ tablet TAKE 1 TABLET (20 MEQ TOTAL) BY MOUTH DAILY. PLEASE SCHEDULE APPOINTMENT FOR ADDITIONAL REFILLS. (Patient taking differently: Take 20 mEq by mouth daily.) 90 tablet 2   prochlorperazine (COMPAZINE) 10 MG tablet Take 1 tablet (10 mg total) by mouth every 6 (six) hours as needed for nausea or vomiting. 30 tablet 1   rivaroxaban (XARELTO) 20 MG TABS tablet Take 1 tablet (20 mg total) by mouth daily with supper. 30 tablet 3   sacubitril-valsartan (ENTRESTO) 97-103 MG TAKE 1 TABLET BY MOUTH TWICE A DAY (Patient taking differently: Take 1 tablet by mouth 2 (two) times daily.) 180 tablet 3   senna-docusate (SENOKOT-S) 8.6-50 MG tablet Take 2 tablets by mouth daily. 60 tablet 1   spironolactone (ALDACTONE) 25 MG tablet Take 1 tablet (25 mg total) by mouth daily. 30 tablet 3   No current facility-administered medications for this visit.   Facility-Administered Medications Ordered in Other Visits  Medication Dose Route Frequency Provider Last Rate Last Admin   CARBOplatin (PARAPLATIN) 150 mg in sodium chloride 0.9 % 100 mL chemo infusion  150 mg Intravenous Once Bertis Ruddy, Ossie Yebra, MD       iron sucrose (VENOFER) 200 mg in sodium chloride 0.9 % 100 mL IVPB  200 mg Intravenous Once Bertis Ruddy, Tykiera Raven, MD       PACLitaxel  (TAXOL) 78 mg in sodium chloride 0.9 % 250 mL chemo infusion (</= 80mg /m2)  45 mg/m2 (Treatment Plan Recorded) Intravenous Once Artis Delay, MD        SUMMARY OF ONCOLOGIC HISTORY: Oncology History Overview Note  Foundation One test showed no actionable mutations. 6TMB, MSI low   Non-small cell lung cancer ([name])  06/11/2022 Initial Diagnosis   Non-small cell lung cancer ([name])   06/20/2022 Pathology Results   CYTOLOGY - NON PAP  CASE: [id number]  PATIENT: Benjamin Cook  Non-Gynecological Cytology Report   Clinical History: Right hilar mass  Specimen Submitted:  A. LUNG, RIGHT HILAR MASS, FINE NEEDLE ASPIRATION:    FINAL MICROSCOPIC DIAGNOSIS:  - Malignant cells consistent with non-small cell carcinoma  - See comment   SPECIMEN ADEQUACY:  Satisfactory for evaluation   DIAGNOSTIC COMMENTS:  The specimen contains atypical cells with malignant appearance characterized by nuclear enlargement, pleomorphism, clumped chromatin and occasional nucleoli. Immunohistochemical stains were attempted however no cells were present on the stains. The morphology does not support a small cell carcinoma with differential diagnosis including adenocarcinoma, large cell neuroendocrine carcinoma, or large cell carcinoma, et al.  More diagnostic material is needed for further classification and molecular tests.    07/02/2022 PET scan   1. Right suprahilar hypermetabolic mass, consistent with primary lung malignancy. 2. Subcarinal hypermetabolic activity which is contiguous with right hilar mass, possibly due to mediastinal extension or subcarinal lymphadenopathy. 3. No evidence of metastatic disease in the abdomen or pelvis. 4. Right adrenal gland mass with low-level FDG uptake, unchanged when compared with prior exams dating back to 2018 and consistent with benign adenoma. 5. Hypermetabolic right paraspinal consolidation, likely infectious or inflammatory. 6. Aortic Atherosclerosis (ICD10-I70.0) and  Emphysema (ICD10-J43.9).     07/02/2022 Cancer Staging   Staging form: Lung, AJCC 8th Edition - Clinical stage from 07/02/2022: Stage IIIA (cT2, cN2, cM0) - Signed by Artis Delay, MD on 07/02/2022 Stage prefix: Initial diagnosis   07/09/2022 Procedure   Successful placement of a right internal jugular approach power injectable Port-A-Cath. The catheter is ready for immediate use.     07/15/2022 -  Chemotherapy   Patient is on Treatment Plan : LUNG Carboplatin + Paclitaxel + XRT q7d       PHYSICAL EXAMINATION: ECOG PERFORMANCE STATUS: 1 - Symptomatic but completely ambulatory  Vitals:   07/22/22 1125  BP: (!) 134/56  Pulse: 77  Resp: 18  Temp: (!) 97.4 F (36.3 C)  SpO2: 100%   Filed Weights   07/22/22 1125  Weight: 133 lb 3.2 oz (60.4 kg)    GENERAL:alert, no distress and comfortable SKIN: skin color, texture, turgor are normal, no rashes or significant lesions EYES: normal, Conjunctiva are pink and non-injected, sclera clear OROPHARYNX:no exudate, no erythema and lips, buccal mucosa, and tongue normal  NECK: supple, thyroid normal size, non-tender, without nodularity LYMPH:  no palpable lymphadenopathy in the cervical, axillary or inguinal LUNGS: clear to auscultation and percussion with normal breathing effort HEART: regular rate & rhythm and no murmurs and no lower extremity edema ABDOMEN:abdomen soft, non-tender and normal bowel sounds Musculoskeletal:no cyanosis of digits and no clubbing  NEURO: alert & oriented x 3 with fluent speech, no focal motor/sensory deficits  LABORATORY DATA:  I have reviewed the data as listed    Component Value Date/Time   NA 136 07/22/2022 1048   NA 138 04/15/2022 0913   K 4.6 07/22/2022 1048   CL 105 07/22/2022 1048   CO2 26 07/22/2022 1048   GLUCOSE 104 (H) 07/22/2022 1048   BUN 10 07/22/2022 1048   BUN 7 (L) 04/15/2022 0913   CREATININE 0.75 07/22/2022 1048   CREATININE 0.88 09/15/2015 0959   CALCIUM 8.5 (L) 07/22/2022 1048    PROT 6.6 07/22/2022 1048   PROT 6.8 11/07/2020 1303   ALBUMIN 2.7 (L) 07/22/2022 1048   ALBUMIN 3.6 (L) 11/07/2020 1303   AST 26 07/22/2022 1048   ALT 24 07/22/2022 1048   ALKPHOS 75 07/22/2022 1048   BILITOT 0.5 07/22/2022 1048   GFRNONAA >60 07/22/2022 1048   GFRAA 96 03/17/2020 1016    No results found for: "SPEP", "UPEP"  Lab Results  Component Value Date   WBC 6.6 07/22/2022   NEUTROABS 5.4 07/22/2022   HGB 9.7 (L) 07/22/2022   HCT 29.5 (L) 07/22/2022   MCV 79.5 (L) 07/22/2022   PLT 256 07/22/2022      Chemistry      Component Value Date/Time   NA 136 07/22/2022 1048   NA 138 04/15/2022 0913   K 4.6 07/22/2022 1048   CL 105 07/22/2022 1048   CO2 26 07/22/2022 1048   BUN 10  07/22/2022 1048   BUN 7 (L) 04/15/2022 0913   CREATININE 0.75 07/22/2022 1048   CREATININE 0.88 09/15/2015 0959      Component Value Date/Time   CALCIUM 8.5 (L) 07/22/2022 1048   ALKPHOS 75 07/22/2022 1048   AST 26 07/22/2022 1048   ALT 24 07/22/2022 1048   BILITOT 0.5 07/22/2022 1048       RADIOGRAPHIC STUDIES: I have personally reviewed the radiological images as listed and agreed with the findings in the report. IR IMAGING GUIDED PORT INSERTION  Result Date: 07/09/2022 INDICATION: Non-small cell lung cancer EXAM: IMPLANTED PORT A CATH PLACEMENT WITH ULTRASOUND AND FLUOROSCOPIC GUIDANCE MEDICATIONS: None ANESTHESIA/SEDATION: Moderate (conscious) sedation was employed during this procedure. A total of Versed 2 mg and Fentanyl 100 mcg was administered intravenously by the radiology nurse. Total intra-service moderate Sedation Time: 18 minutes. The patient's level of consciousness and vital signs were monitored continuously by radiology nursing throughout the procedure under my direct supervision. FLUOROSCOPY: Radiation Exposure Index (as provided by the fluoroscopic device): 3 mGy Kerma COMPLICATIONS: None immediate. PROCEDURE: The procedure, risks, benefits, and alternatives were explained to  the patient. Questions regarding the procedure were encouraged and answered. The patient understands and consents to the procedure. A timeout was performed prior to the initiation of the procedure. Patient positioned supine on the angiography table. Right neck and anterior upper chest prepped and draped in the usual sterile fashion. All elements of maximal sterile barrier were utilized including, cap, mask, sterile gown, sterile gloves, large sterile drape, hand scrubbing and 2% Chlorhexidine for skin cleaning. The right internal jugular vein was evaluated with ultrasound and shown to be patent. A permanent ultrasound image was obtained and placed in the patient's medical record. Local anesthesia was provided with 1% lidocaine with epinephrine. Using sterile gel and a sterile probe cover, the right internal jugular vein was entered with a 21 ga needle during real time ultrasound guidance. 0.018 inch guidewire placed and 21 ga needle exchanged for transitional dilator set. Utilizing fluoroscopy, 0.035 inch guidewire advanced centrally without difficulty. Attention then turned to the right anterior upper chest. Following local lidocaine administration, a port pocket was created. The catheter was connected to the port and brought from the pocket to the venotomy site through a subcutaneous tunnel. The catheter was cut to size and inserted through the peel-away sheath. The catheter tip was positioned at the cavoatrial junction using fluoroscopic guidance. The port aspirated and flushed well. The port pocket was closed with deep and superficial absorbable suture. The port pocket incision and venotomy sites were also sealed with Dermabond. IMPRESSION: Successful placement of a right internal jugular approach power injectable Port-A-Cath. The catheter is ready for immediate use. Electronically Signed   By: Acquanetta Belling M.D.   On: 07/09/2022 14:37   NM PET Image Initial (PI) Skull Base To Thigh  Result Date:  07/02/2022 CLINICAL DATA:  Initial treatment strategy for lung cancer. EXAM: NUCLEAR MEDICINE PET SKULL BASE TO THIGH TECHNIQUE: 6.5 mCi F-18 FDG was injected intravenously. Full-ring PET imaging was performed from the skull base to thigh after the radiotracer. CT data was obtained and used for attenuation correction and anatomic localization. Fasting blood glucose: 107 mg/dl COMPARISON:  Chest CT dated June 03, 2022; CT abdomen and pelvis dated November 15, 2016 FINDINGS: Mediastinal blood pool activity: SUV max 2.0 Liver activity: SUV max NA NECK: No hypermetabolic lymph nodes in the neck. Incidental CT findings: None. CHEST: Large right suprahilar hypermetabolic mass, SUV max of 23.4. Subcarinal  hypermetabolic activity which is contiguous with right hilar mass seen on series 604, image 72, possibly due to mediastinal extension or subcarinal lymphadenopathy. No other hypermetabolic lymph nodes seen in the chest. Hypermetabolic right paraspinal consolidation, likely infectious or inflammatory. Incidental CT findings: Moderate centrilobular emphysema. Bilateral bronchial wall thickening and scattered areas of mucous plugging. ABDOMEN/PELVIS: No abnormal hypermetabolic activity within the liver, pancreas, adrenal glands, or spleen. No hypermetabolic lymph nodes in the abdomen or pelvis. Incidental CT findings: Atherosclerotic disease of the abdominal aorta. Abdominal aortic aneurysm status post endovascular repair. Right adrenal gland mass measuring up to 3.5 cm with low-level FDG uptake. Prostatomegaly. SKELETON: No focal hypermetabolic activity to suggest skeletal metastasis. Incidental CT findings: None. IMPRESSION: 1. Right suprahilar hypermetabolic mass, consistent with primary lung malignancy. 2. Subcarinal hypermetabolic activity which is contiguous with right hilar mass, possibly due to mediastinal extension or subcarinal lymphadenopathy. 3. No evidence of metastatic disease in the abdomen or pelvis. 4. Right  adrenal gland mass with low-level FDG uptake, unchanged when compared with prior exams dating back to 2018 and consistent with benign adenoma. 5. Hypermetabolic right paraspinal consolidation, likely infectious or inflammatory. 6. Aortic Atherosclerosis (ICD10-I70.0) and Emphysema (ICD10-J43.9). Electronically Signed   By: Allegra Lai M.D.   On: 07/02/2022 10:46

## 2022-07-22 NOTE — Assessment & Plan Note (Signed)
He had good bowel movement recently I recommend the patient to discontinue laxatives due to recent loose stool

## 2022-07-22 NOTE — Assessment & Plan Note (Signed)
He is eating better and is gaining weight He will see dietitian today for follow-up

## 2022-07-23 ENCOUNTER — Ambulatory Visit
Admission: RE | Admit: 2022-07-23 | Discharge: 2022-07-23 | Disposition: A | Discharge status: Home / Self Care | Payer: Medicare HMO | Source: Ambulatory Visit | Attending: Radiation Oncology | Admitting: Radiation Oncology

## 2022-07-23 ENCOUNTER — Other Ambulatory Visit: Payer: Self-pay

## 2022-07-23 DIAGNOSIS — Z510 Encounter for antineoplastic radiation therapy: Secondary | ICD-10-CM | POA: Diagnosis not present

## 2022-07-23 LAB — RAD ONC ARIA SESSION SUMMARY
Course Elapsed Days: 8
Plan Fractions Treated to Date: 7
Plan Prescribed Dose Per Fraction: 2 Gy
Plan Total Fractions Prescribed: 30
Plan Total Prescribed Dose: 60 Gy
Reference Point Dosage Given to Date: 14 Gy
Reference Point Session Dosage Given: 2 Gy
Session Number: 7

## 2022-07-24 ENCOUNTER — Ambulatory Visit
Admission: RE | Admit: 2022-07-24 | Discharge: 2022-07-24 | Disposition: A | Discharge status: Home / Self Care | Payer: Medicare HMO | Source: Ambulatory Visit | Attending: Radiation Oncology | Admitting: Radiation Oncology

## 2022-07-24 ENCOUNTER — Other Ambulatory Visit: Payer: Self-pay

## 2022-07-24 DIAGNOSIS — Z510 Encounter for antineoplastic radiation therapy: Secondary | ICD-10-CM | POA: Diagnosis not present

## 2022-07-24 LAB — RAD ONC ARIA SESSION SUMMARY
Course Elapsed Days: 9
Plan Fractions Treated to Date: 8
Plan Prescribed Dose Per Fraction: 2 Gy
Plan Total Fractions Prescribed: 30
Plan Total Prescribed Dose: 60 Gy
Reference Point Dosage Given to Date: 16 Gy
Reference Point Session Dosage Given: 2 Gy
Session Number: 8

## 2022-07-25 ENCOUNTER — Telehealth: Payer: Self-pay | Admitting: Cardiology

## 2022-07-25 ENCOUNTER — Other Ambulatory Visit: Payer: Self-pay

## 2022-07-25 ENCOUNTER — Ambulatory Visit
Admission: RE | Admit: 2022-07-25 | Discharge: 2022-07-25 | Disposition: A | Discharge status: Home / Self Care | Payer: Medicare HMO | Source: Ambulatory Visit | Attending: Radiation Oncology | Admitting: Radiation Oncology

## 2022-07-25 DIAGNOSIS — Z510 Encounter for antineoplastic radiation therapy: Secondary | ICD-10-CM | POA: Diagnosis not present

## 2022-07-25 DIAGNOSIS — I480 Paroxysmal atrial fibrillation: Secondary | ICD-10-CM

## 2022-07-25 LAB — RAD ONC ARIA SESSION SUMMARY
Course Elapsed Days: 10
Plan Fractions Treated to Date: 9
Plan Prescribed Dose Per Fraction: 2 Gy
Plan Total Fractions Prescribed: 30
Plan Total Prescribed Dose: 60 Gy
Reference Point Dosage Given to Date: 18 Gy
Reference Point Session Dosage Given: 2 Gy
Session Number: 9

## 2022-07-25 NOTE — Telephone Encounter (Signed)
*  STAT* If patient is at the pharmacy, call can be transferred to refill team.   1. Which medications need to be refilled? (please list name of each medication and dose if known)  new prescription for Xarelto- changing pharmacy  2. Which pharmacy/location (including street and city if local pharmacy) is medication to be sent to?  [phone]  [phone]  3. Do they need a 30 day or 90 day supply? 30 days and refills

## 2022-07-26 ENCOUNTER — Other Ambulatory Visit: Payer: Self-pay

## 2022-07-26 ENCOUNTER — Ambulatory Visit
Admission: RE | Admit: 2022-07-26 | Discharge: 2022-07-26 | Disposition: A | Discharge status: Home / Self Care | Payer: Medicare HMO | Source: Ambulatory Visit | Attending: Radiation Oncology | Admitting: Radiation Oncology

## 2022-07-26 DIAGNOSIS — Z510 Encounter for antineoplastic radiation therapy: Secondary | ICD-10-CM | POA: Diagnosis not present

## 2022-07-26 LAB — RAD ONC ARIA SESSION SUMMARY
Course Elapsed Days: 11
Plan Fractions Treated to Date: 10
Plan Prescribed Dose Per Fraction: 2 Gy
Plan Total Fractions Prescribed: 30
Plan Total Prescribed Dose: 60 Gy
Reference Point Dosage Given to Date: 20 Gy
Reference Point Session Dosage Given: 2 Gy
Session Number: 10

## 2022-07-26 MED ORDER — RIVAROXABAN 20 MG PO TABS
20.0000 mg | ORAL_TABLET | Freq: Every day | ORAL | 1 refills | Status: AC
Start: 2022-07-26 — End: ?

## 2022-07-26 MED FILL — Dexamethasone Sodium Phosphate Inj 100 MG/10ML: INTRAMUSCULAR | Qty: 1 | Status: AC

## 2022-07-26 NOTE — Telephone Encounter (Signed)
Xarelto 20mg  refill request received. Pt is 77 years old, weight-60.4kg, Crea-0.75 on 07/22/22, last seen by Dr. [name] on 06/28/22, Diagnosis-Afib, CrCl-69.35 mL/min; Dose is appropriate based on dosing criteria. Will send in refill to requested pharmacy.

## 2022-07-29 ENCOUNTER — Ambulatory Visit
Admission: RE | Admit: 2022-07-29 | Discharge: 2022-07-29 | Disposition: A | Discharge status: Home / Self Care | Payer: Medicare HMO | Source: Ambulatory Visit | Attending: Radiation Oncology | Admitting: Radiation Oncology

## 2022-07-29 ENCOUNTER — Other Ambulatory Visit: Payer: Self-pay

## 2022-07-29 ENCOUNTER — Encounter: Payer: Self-pay | Admitting: Hematology and Oncology

## 2022-07-29 ENCOUNTER — Inpatient Hospital Stay: Payer: Medicare HMO

## 2022-07-29 ENCOUNTER — Inpatient Hospital Stay (HOSPITAL_BASED_OUTPATIENT_CLINIC_OR_DEPARTMENT_OTHER): Payer: Medicare HMO | Admitting: Hematology and Oncology

## 2022-07-29 VITALS — BP 128/59 | HR 78 | Resp 16

## 2022-07-29 VITALS — BP 108/48 | HR 92 | Temp 97.4°F | Resp 18 | Ht 70.0 in | Wt 132.6 lb

## 2022-07-29 DIAGNOSIS — R634 Abnormal weight loss: Secondary | ICD-10-CM

## 2022-07-29 DIAGNOSIS — D61818 Other pancytopenia: Secondary | ICD-10-CM | POA: Diagnosis not present

## 2022-07-29 DIAGNOSIS — C3491 Malignant neoplasm of unspecified part of right bronchus or lung: Secondary | ICD-10-CM

## 2022-07-29 DIAGNOSIS — Z510 Encounter for antineoplastic radiation therapy: Secondary | ICD-10-CM | POA: Diagnosis not present

## 2022-07-29 DIAGNOSIS — I952 Hypotension due to drugs: Secondary | ICD-10-CM | POA: Diagnosis not present

## 2022-07-29 DIAGNOSIS — Z5111 Encounter for antineoplastic chemotherapy: Secondary | ICD-10-CM | POA: Diagnosis not present

## 2022-07-29 LAB — RAD ONC ARIA SESSION SUMMARY
Course Elapsed Days: 14
Plan Fractions Treated to Date: 11
Plan Prescribed Dose Per Fraction: 2 Gy
Plan Total Fractions Prescribed: 30
Plan Total Prescribed Dose: 60 Gy
Reference Point Dosage Given to Date: 22 Gy
Reference Point Session Dosage Given: 2 Gy
Session Number: 11

## 2022-07-29 LAB — CBC WITH DIFFERENTIAL (CANCER CENTER ONLY)
Abs Immature Granulocytes: 0.03 10*3/uL (ref 0.00–0.07)
Basophils Absolute: 0 10*3/uL (ref 0.0–0.1)
Basophils Relative: 1 %
Eosinophils Absolute: 0 10*3/uL (ref 0.0–0.5)
Eosinophils Relative: 1 %
HCT: 28.2 % — ABNORMAL LOW (ref 39.0–52.0)
Hemoglobin: 9.1 g/dL — ABNORMAL LOW (ref 13.0–17.0)
Immature Granulocytes: 1 %
Lymphocytes Relative: 15 %
Lymphs Abs: 0.4 10*3/uL — ABNORMAL LOW (ref 0.7–4.0)
MCH: 25.7 pg — ABNORMAL LOW (ref 26.0–34.0)
MCHC: 32.3 g/dL (ref 30.0–36.0)
MCV: 79.7 fL — ABNORMAL LOW (ref 80.0–100.0)
Monocytes Absolute: 0.3 10*3/uL (ref 0.1–1.0)
Monocytes Relative: 12 %
Neutro Abs: 1.8 10*3/uL (ref 1.7–7.7)
Neutrophils Relative %: 70 %
Platelet Count: 227 10*3/uL (ref 150–400)
RBC: 3.54 MIL/uL — ABNORMAL LOW (ref 4.22–5.81)
RDW: 20.1 % — ABNORMAL HIGH (ref 11.5–15.5)
WBC Count: 2.5 10*3/uL — ABNORMAL LOW (ref 4.0–10.5)
nRBC: 0 % (ref 0.0–0.2)

## 2022-07-29 LAB — CMP (CANCER CENTER ONLY)
ALT: 25 U/L (ref 0–44)
AST: 23 U/L (ref 15–41)
Albumin: 2.6 g/dL — ABNORMAL LOW (ref 3.5–5.0)
Alkaline Phosphatase: 75 U/L (ref 38–126)
Anion gap: 5 (ref 5–15)
BUN: 13 mg/dL (ref 8–23)
CO2: 24 mmol/L (ref 22–32)
Calcium: 8.4 mg/dL — ABNORMAL LOW (ref 8.9–10.3)
Chloride: 103 mmol/L (ref 98–111)
Creatinine: 0.85 mg/dL (ref 0.61–1.24)
GFR, Estimated: >60 mL/min (ref >60)
Glucose, Bld: 120 mg/dL — ABNORMAL HIGH (ref 70–99)
Potassium: 4.3 mmol/L (ref 3.5–5.1)
Sodium: 132 mmol/L — ABNORMAL LOW (ref 135–145)
Total Bilirubin: 0.6 mg/dL (ref 0.3–1.2)
Total Protein: 6.3 g/dL — ABNORMAL LOW (ref 6.5–8.1)

## 2022-07-29 MED ORDER — PALONOSETRON HCL INJECTION 0.25 MG/5ML
0.2500 mg | Freq: Once | INTRAVENOUS | Status: AC
  Administered 2022-07-29: 0.25 mg via INTRAVENOUS
  Filled 2022-07-29: qty 5

## 2022-07-29 MED ORDER — SODIUM CHLORIDE 0.9 % IV SOLN: Freq: Once | INTRAVENOUS | Status: AC

## 2022-07-29 MED ORDER — SODIUM CHLORIDE 0.9% FLUSH
10.0000 mL | Freq: Once | INTRAVENOUS | Status: AC
  Administered 2022-07-29: 10 mL

## 2022-07-29 MED ORDER — SODIUM CHLORIDE 0.9 % IV SOLN
10.0000 mg | Freq: Once | INTRAVENOUS | Status: AC
  Administered 2022-07-29: 10 mg via INTRAVENOUS
  Filled 2022-07-29: qty 10

## 2022-07-29 MED ORDER — HEPARIN SOD (PORK) LOCK FLUSH 100 UNIT/ML IV SOLN
500.0000 [IU] | Freq: Once | INTRAVENOUS | Status: AC | PRN
  Administered 2022-07-29: 500 [IU]

## 2022-07-29 MED ORDER — SODIUM CHLORIDE 0.9% FLUSH
10.0000 mL | INTRAVENOUS | Status: DC | PRN
  Administered 2022-07-29: 10 mL

## 2022-07-29 MED ORDER — FAMOTIDINE IN NACL 20-0.9 MG/50ML-% IV SOLN
20.0000 mg | Freq: Once | INTRAVENOUS | Status: AC
  Administered 2022-07-29: 20 mg via INTRAVENOUS
  Filled 2022-07-29: qty 50

## 2022-07-29 MED ORDER — SODIUM CHLORIDE 0.9 % IV SOLN
45.0000 mg/m2 | Freq: Once | INTRAVENOUS | Status: AC
  Administered 2022-07-29: 78 mg via INTRAVENOUS
  Filled 2022-07-29: qty 13

## 2022-07-29 MED ORDER — SODIUM CHLORIDE 0.9 % IV SOLN
200.0000 mg | Freq: Once | INTRAVENOUS | Status: AC
  Administered 2022-07-29: 200 mg via INTRAVENOUS
  Filled 2022-07-29: qty 200

## 2022-07-29 MED ORDER — SODIUM CHLORIDE 0.9 % IV SOLN
150.0000 mg | Freq: Once | INTRAVENOUS | Status: AC
  Administered 2022-07-29: 150 mg via INTRAVENOUS
  Filled 2022-07-29: qty 15

## 2022-07-29 NOTE — Assessment & Plan Note (Signed)
He has recent profound weight loss, low protein status and poor oral intake We discussed importance of additional nutritional supplement as tolerated

## 2022-07-29 NOTE — Assessment & Plan Note (Signed)
This is likely due to his treatment He is not symptomatic He is receiving intravenous iron infusion for anemia We will proceed with treatment without delay

## 2022-07-29 NOTE — Patient Instructions (Signed)
[facility]  Discharge Instructions: Thank you for choosing [facility] to provide your oncology and hematology care.   If you have a lab appointment with the Cancer Center, please go directly to the Cancer Center and check in at the registration area.   Wear comfortable clothing and clothing appropriate for easy access to any Portacath or PICC line.   We strive to give you quality time with your provider. You may need to reschedule your appointment if you arrive late (15 or more minutes).  Arriving late affects you and other patients whose appointments are after yours.  Also, if you miss three or more appointments without notifying the office, you may be dismissed from the clinic at the provider's discretion.      For prescription refill requests, have your pharmacy contact our office and allow 72 hours for refills to be completed.    Today you received the following chemotherapy and/or immunotherapy agents: paclitaxel and carboplatin      To help prevent nausea and vomiting after your treatment, we encourage you to take your nausea medication as directed.  BELOW ARE SYMPTOMS THAT SHOULD BE REPORTED IMMEDIATELY: *FEVER GREATER THAN 100.4 F (38 C) OR HIGHER *CHILLS OR SWEATING *NAUSEA AND VOMITING THAT IS NOT CONTROLLED WITH YOUR NAUSEA MEDICATION *UNUSUAL SHORTNESS OF BREATH *UNUSUAL BRUISING OR BLEEDING *URINARY PROBLEMS (pain or burning when urinating, or frequent urination) *BOWEL PROBLEMS (unusual diarrhea, constipation, pain near the anus) TENDERNESS IN MOUTH AND THROAT WITH OR WITHOUT PRESENCE OF ULCERS (sore throat, sores in mouth, or a toothache) UNUSUAL RASH, SWELLING OR PAIN  UNUSUAL VAGINAL DISCHARGE OR ITCHING   Items with * indicate a potential emergency and should be followed up as soon as possible or go to the Emergency Department if any problems should occur.  Please show the CHEMOTHERAPY ALERT CARD or IMMUNOTHERAPY  ALERT CARD at check-in to the Emergency Department and triage nurse.  Should you have questions after your visit or need to cancel or reschedule your appointment, please contact [facility]  Dept: [phone]  and follow the prompts.  Office hours are 8:00 a.m. to 4:30 p.m. Monday - Friday. Please note that voicemails left after 4:00 p.m. may not be returned until the following business day.  We are closed weekends and major holidays. You have access to a nurse at all times for urgent questions. Please call the main number to the clinic Dept: [phone] and follow the prompts.   For any non-urgent questions, you may also contact your provider using MyChart. We now offer e-Visits for anyone 18 and older to request care online for non-urgent symptoms. For details visit [email or web address].   Also download the MyChart app! Go to the app store, search "MyChart", open the app, select [facility], and log in with your MyChart username and password.   

## 2022-07-29 NOTE — Progress Notes (Signed)
[facility] OFFICE PROGRESS NOTE  Patient Care Team: [name], [name], [name], [name]  ASSESSMENT & PLAN:  Non-small cell lung cancer ([name]) Overall, Benjamin Cook tolerated treatment well except for progressive pancytopenia and poor appetite We spent a significant amount of time reviewing importance of adequate oral intake Benjamin Cook will proceed with ongoing radiation therapy and chemotherapy today I will see him again next week for supportive care  Drug-induced hypotension Benjamin Cook is on multiple medications prescribed by cardiologist that could cause low blood pressure Benjamin Cook is somewhat symptomatic We discussed importance of adequate hydration I will reach out to his cardiologist to see if any of his medications can be adjusted or reduced  WEIGHT LOSS Benjamin Cook has recent profound weight loss, low protein status and poor oral intake We discussed importance of additional nutritional supplement as tolerated  Pancytopenia, acquired ([name]) This is likely due to his treatment Benjamin Cook is not symptomatic Benjamin Cook is receiving intravenous iron infusion for anemia We will proceed with treatment without delay  No orders of the defined types were placed in this encounter.   All questions were answered. The patient knows to call the clinic with any problems, questions or concerns. The total time spent in the appointment was 30 minutes encounter with patients including review of chart and various tests results, discussions about plan of care and coordination of care plan   Artis Delay, MD 07/29/2022 10:54 AM  INTERVAL HISTORY: Please see below for problem oriented charting. Benjamin Cook returns for treatment follow-up with his wife Benjamin Cook had recent bowel movement and slight loose stool since Benjamin Cook started taking MiraLAX on a regular basis Benjamin Cook has poor appetite and poor oral intake Benjamin Cook continues to  have weight loss Benjamin Cook felt weak overall His blood pressure is noted to be low Denies recent nausea or neuropathy from treatment  REVIEW OF SYSTEMS:   Constitutional: Denies fevers, chills  Eyes: Denies blurriness of vision Ears, nose, mouth, throat, and face: Denies mucositis or sore throat Respiratory: Denies cough, dyspnea or wheezes Cardiovascular: Denies palpitation, chest discomfort or lower extremity swelling Skin: Denies abnormal skin rashes Lymphatics: Denies new lymphadenopathy or easy bruising Neurological:Denies numbness, tingling or new weaknesses Behavioral/Psych: Mood is stable, no new changes  All other systems were reviewed with the patient and are negative.  I have reviewed the past medical history, past surgical history, social history and family history with the patient and they are unchanged from previous note.  ALLERGIES:  has No Known Allergies.  MEDICATIONS:  Current Outpatient Medications  Medication Sig Dispense Refill   acetaminophen (TYLENOL) 500 MG tablet Take 500-1,000 mg by mouth every 8 (eight) hours as needed (for pain).      amiodarone (PACERONE) 400 MG tablet Take 0.5 tablets (200 mg total) by mouth 2 (two) times daily. 28 tablet 0   atorvastatin (LIPITOR) 80 MG tablet TAKE 1 TABLET BY MOUTH DAILY AT 6 PM (Patient taking differently: Take 80 mg by mouth daily. TAKE 1 TABLET BY MOUTH  DAILY AT 6 PM) 90 tablet 3   isosorbide mononitrate (IMDUR) 30 MG 24 hr tablet TAKE 1/2 OF A TABLET (15 MG TOTAL) BY MOUTH DAILY (Patient taking differently: Take 30 mg by mouth daily.) 45 tablet 3   lidocaine-prilocaine (EMLA) cream Apply to affected area once 30 g 3   melatonin 5 MG TABS Take 1 tablet (5 mg total)  by mouth at bedtime. 30 tablet 0   metoprolol succinate (TOPROL-XL) 50 MG 24 hr tablet Take 1 tablet (50 mg total) by mouth 2 (two) times daily. Take with or immediately following a meal. 30 tablet 3   mometasone-formoterol (DULERA) 100-5 MCG/ACT AERO Inhale 2  puffs into the lungs in the morning and at bedtime. 8.8 each 1   Multiple Vitamin (MULTIVITAMIN) capsule Take 1 capsule by mouth daily.     ondansetron (ZOFRAN) 8 MG tablet Take 1 tablet (8 mg total) by mouth every 8 (eight) hours as needed for nausea. 30 tablet 3   pantoprazole (PROTONIX) 40 MG tablet Take 40 mg by mouth daily.     potassium chloride SA (KLOR-CON M20) 20 MEQ tablet TAKE 1 TABLET (20 MEQ TOTAL) BY MOUTH DAILY. PLEASE SCHEDULE APPOINTMENT FOR ADDITIONAL REFILLS. (Patient taking differently: Take 20 mEq by mouth daily.) 90 tablet 2   prochlorperazine (COMPAZINE) 10 MG tablet Take 1 tablet (10 mg total) by mouth every 6 (six) hours as needed for nausea or vomiting. 30 tablet 1   rivaroxaban (XARELTO) 20 MG TABS tablet Take 1 tablet (20 mg total) by mouth daily with supper. 90 tablet 1   sacubitril-valsartan (ENTRESTO) 97-103 MG TAKE 1 TABLET BY MOUTH TWICE A DAY (Patient taking differently: Take 1 tablet by mouth 2 (two) times daily.) 180 tablet 3   senna-docusate (SENOKOT-S) 8.6-50 MG tablet Take 2 tablets by mouth daily. 60 tablet 1   spironolactone (ALDACTONE) 25 MG tablet Take 1 tablet (25 mg total) by mouth daily. 30 tablet 3   No current facility-administered medications for this visit.    SUMMARY OF ONCOLOGIC HISTORY: Oncology History Overview Note  Foundation One test showed no actionable mutations. 6TMB, MSI low   Non-small cell lung cancer ([name])  06/11/2022 Initial Diagnosis   Non-small cell lung cancer ([name])   06/20/2022 Pathology Results   CYTOLOGY - NON PAP  CASE: [id number]  PATIENT: Benjamin Cook  Non-Gynecological Cytology Report   Clinical History: Right hilar mass  Specimen Submitted:  A. LUNG, RIGHT HILAR MASS, FINE NEEDLE ASPIRATION:    FINAL MICROSCOPIC DIAGNOSIS:  - Malignant cells consistent with non-small cell carcinoma  - See comment   SPECIMEN ADEQUACY:  Satisfactory for evaluation   DIAGNOSTIC COMMENTS:  The specimen contains atypical  cells with malignant appearance characterized by nuclear enlargement, pleomorphism, clumped chromatin and occasional nucleoli. Immunohistochemical stains were attempted however no cells were present on the stains. The morphology does not support a small cell carcinoma with differential diagnosis including adenocarcinoma, large cell neuroendocrine carcinoma, or large cell carcinoma, et al. More diagnostic material is needed for further classification and molecular tests.    07/02/2022 PET scan   1. Right suprahilar hypermetabolic mass, consistent with primary lung malignancy. 2. Subcarinal hypermetabolic activity which is contiguous with right hilar mass, possibly due to mediastinal extension or subcarinal lymphadenopathy. 3. No evidence of metastatic disease in the abdomen or pelvis. 4. Right adrenal gland mass with low-level FDG uptake, unchanged when compared with prior exams dating back to 2018 and consistent with benign adenoma. 5. Hypermetabolic right paraspinal consolidation, likely infectious or inflammatory. 6. Aortic Atherosclerosis (ICD10-I70.0) and Emphysema (ICD10-J43.9).     07/02/2022 Cancer Staging   Staging form: Lung, AJCC 8th Edition - Clinical stage from 07/02/2022: Stage IIIA (cT2, cN2, cM0) - Signed by Artis Delay, MD on 07/02/2022 Stage prefix: Initial diagnosis   07/09/2022 Procedure   Successful placement of a right internal jugular approach power injectable Port-A-Cath. The catheter  is ready for immediate use.     07/15/2022 -  Chemotherapy   Patient is on Treatment Plan : LUNG Carboplatin + Paclitaxel + XRT q7d       PHYSICAL EXAMINATION: ECOG PERFORMANCE STATUS: 2 - Symptomatic, <50% confined to bed  Vitals:   07/29/22 1001  BP: (!) 108/48  Pulse: 92  Resp: 18  Temp: (!) 97.4 F (36.3 C)  SpO2: 96%   Filed Weights   07/29/22 1001  Weight: 132 lb 9.6 oz (60.1 kg)    GENERAL:alert, no distress and comfortable.  Benjamin Cook looks thin and cachectic NEURO: alert &  oriented x 3 with fluent speech, no focal motor/sensory deficits  LABORATORY DATA:  I have reviewed the data as listed    Component Value Date/Time   NA 132 (L) 07/29/2022 0937   NA 138 04/15/2022 0913   K 4.3 07/29/2022 0937   CL 103 07/29/2022 0937   CO2 24 07/29/2022 0937   GLUCOSE 120 (H) 07/29/2022 0937   BUN 13 07/29/2022 0937   BUN 7 (L) 04/15/2022 0913   CREATININE 0.85 07/29/2022 0937   CREATININE 0.88 09/15/2015 0959   CALCIUM 8.4 (L) 07/29/2022 0937   PROT 6.3 (L) 07/29/2022 0937   PROT 6.8 11/07/2020 1303   ALBUMIN 2.6 (L) 07/29/2022 0937   ALBUMIN 3.6 (L) 11/07/2020 1303   AST 23 07/29/2022 0937   ALT 25 07/29/2022 0937   ALKPHOS 75 07/29/2022 0937   BILITOT 0.6 07/29/2022 0937   GFRNONAA >60 07/29/2022 0937   GFRAA 96 03/17/2020 1016    No results found for: "SPEP", "UPEP"  Lab Results  Component Value Date   WBC 2.5 (L) 07/29/2022   NEUTROABS 1.8 07/29/2022   HGB 9.1 (L) 07/29/2022   HCT 28.2 (L) 07/29/2022   MCV 79.7 (L) 07/29/2022   PLT 227 07/29/2022      Chemistry      Component Value Date/Time   NA 132 (L) 07/29/2022 0937   NA 138 04/15/2022 0913   K 4.3 07/29/2022 0937   CL 103 07/29/2022 0937   CO2 24 07/29/2022 0937   BUN 13 07/29/2022 0937   BUN 7 (L) 04/15/2022 0913   CREATININE 0.85 07/29/2022 0937   CREATININE 0.88 09/15/2015 0959      Component Value Date/Time   CALCIUM 8.4 (L) 07/29/2022 0937   ALKPHOS 75 07/29/2022 0937   AST 23 07/29/2022 0937   ALT 25 07/29/2022 0937   BILITOT 0.6 07/29/2022 8756

## 2022-07-29 NOTE — Assessment & Plan Note (Signed)
He is on multiple medications prescribed by cardiologist that could cause low blood pressure He is somewhat symptomatic We discussed importance of adequate hydration I will reach out to his cardiologist to see if any of his medications can be adjusted or reduced

## 2022-07-29 NOTE — Assessment & Plan Note (Signed)
Overall, he tolerated treatment well except for progressive pancytopenia and poor appetite We spent a significant amount of time reviewing importance of adequate oral intake He will proceed with ongoing radiation therapy and chemotherapy today I will see him again next week for supportive care

## 2022-07-30 ENCOUNTER — Telehealth: Payer: Self-pay | Admitting: Cardiology

## 2022-07-30 ENCOUNTER — Other Ambulatory Visit: Payer: Self-pay

## 2022-07-30 ENCOUNTER — Ambulatory Visit
Admission: RE | Admit: 2022-07-30 | Discharge: 2022-07-30 | Disposition: A | Discharge status: Home / Self Care | Payer: Medicare HMO | Source: Ambulatory Visit | Attending: Radiation Oncology | Admitting: Radiation Oncology

## 2022-07-30 ENCOUNTER — Telehealth: Payer: Self-pay | Admitting: *Deleted

## 2022-07-30 DIAGNOSIS — Z510 Encounter for antineoplastic radiation therapy: Secondary | ICD-10-CM | POA: Diagnosis not present

## 2022-07-30 LAB — RAD ONC ARIA SESSION SUMMARY
Course Elapsed Days: 15
Plan Fractions Treated to Date: 12
Plan Prescribed Dose Per Fraction: 2 Gy
Plan Total Fractions Prescribed: 30
Plan Total Prescribed Dose: 60 Gy
Reference Point Dosage Given to Date: 24 Gy
Reference Point Session Dosage Given: 2 Gy
Session Number: 12

## 2022-07-30 MED ORDER — ENTRESTO 24-26 MG PO TABS: 1.0000 | ORAL_TABLET | Freq: Two times a day (BID) | ORAL | 3 refills | Status: DC

## 2022-07-30 NOTE — Telephone Encounter (Signed)
-----   Message from [name], MD sent at 07/29/2022 12:38 PM EDT ----- Regarding: RE: mutual patient Decrease entresto to 24/26 BID, DC imdur and follow BP. Benjamin Cook  ----- Message ----- From: Artis Delay, MD Sent: 07/29/2022  10:56 AM EDT To: [name], MD Subject: mutual patient                                 Hi,  This is our mutual patient currently on chemo and radiation His BP is trending really low due to poor oral intake Can you take a look at his current medications and see if we can reduce/stop any of his cardiac meds?  Thanks, Ni

## 2022-07-30 NOTE — Telephone Encounter (Signed)
Spoke with pt, Aware of dr Ludwig Clarks recommendations. They get the entresto from [employer] so I will contact them. She is going to contact me when she gets back home to confirm the current mgs of the entresto they have. Spoke with [employer] patient assistance, verbal order for entresto 24/26 mg twice daily given to the pharmacist.

## 2022-07-30 NOTE — Telephone Encounter (Signed)
Wife aware entresto sent to cover my meds pharmacy on file

## 2022-07-30 NOTE — Telephone Encounter (Signed)
Pt c/o medication issue:  1. Name of Medication: cubitril-valsartan (ENTRESTO) 24-26 MG   2. How are you currently taking this medication (dosage and times per day)? Take 1 tablet by mouth 2 (two) times daily.   3. Are you having a reaction (difficulty breathing--STAT)? no  4. What is your medication issue? Wife states the medcation that he has 97-103 MG. Calling to see if the medication needs to be broken in half. Please advise

## 2022-07-31 ENCOUNTER — Other Ambulatory Visit: Payer: Self-pay

## 2022-07-31 ENCOUNTER — Ambulatory Visit
Admission: RE | Admit: 2022-07-31 | Discharge: 2022-07-31 | Disposition: A | Discharge status: Home / Self Care | Payer: Medicare HMO | Source: Ambulatory Visit | Attending: Radiation Oncology | Admitting: Radiation Oncology

## 2022-07-31 DIAGNOSIS — Z510 Encounter for antineoplastic radiation therapy: Secondary | ICD-10-CM | POA: Diagnosis not present

## 2022-07-31 LAB — RAD ONC ARIA SESSION SUMMARY
Course Elapsed Days: 16
Plan Fractions Treated to Date: 13
Plan Prescribed Dose Per Fraction: 2 Gy
Plan Total Fractions Prescribed: 30
Plan Total Prescribed Dose: 60 Gy
Reference Point Dosage Given to Date: 26 Gy
Reference Point Session Dosage Given: 2 Gy
Session Number: 13

## 2022-08-01 ENCOUNTER — Other Ambulatory Visit: Payer: Self-pay

## 2022-08-01 ENCOUNTER — Ambulatory Visit
Admission: RE | Admit: 2022-08-01 | Discharge: 2022-08-01 | Disposition: A | Discharge status: Home / Self Care | Payer: Medicare HMO | Source: Ambulatory Visit | Attending: Radiation Oncology | Admitting: Radiation Oncology

## 2022-08-01 DIAGNOSIS — Z510 Encounter for antineoplastic radiation therapy: Secondary | ICD-10-CM | POA: Diagnosis not present

## 2022-08-01 LAB — RAD ONC ARIA SESSION SUMMARY
Course Elapsed Days: 17
Plan Fractions Treated to Date: 14
Plan Prescribed Dose Per Fraction: 2 Gy
Plan Total Fractions Prescribed: 30
Plan Total Prescribed Dose: 60 Gy
Reference Point Dosage Given to Date: 28 Gy
Reference Point Session Dosage Given: 2 Gy
Session Number: 14

## 2022-08-02 ENCOUNTER — Ambulatory Visit
Admission: RE | Admit: 2022-08-02 | Discharge: 2022-08-02 | Disposition: A | Discharge status: Home / Self Care | Payer: Medicare HMO | Source: Ambulatory Visit | Attending: Radiation Oncology | Admitting: Radiation Oncology

## 2022-08-02 ENCOUNTER — Other Ambulatory Visit: Payer: Self-pay | Admitting: *Deleted

## 2022-08-02 ENCOUNTER — Inpatient Hospital Stay: Payer: Medicare HMO

## 2022-08-02 ENCOUNTER — Other Ambulatory Visit: Payer: Self-pay

## 2022-08-02 VITALS — BP 105/49 | HR 84 | Temp 97.8°F | Resp 18

## 2022-08-02 DIAGNOSIS — C3480 Malignant neoplasm of overlapping sites of unspecified bronchus and lung: Secondary | ICD-10-CM

## 2022-08-02 DIAGNOSIS — Z5111 Encounter for antineoplastic chemotherapy: Secondary | ICD-10-CM | POA: Diagnosis not present

## 2022-08-02 DIAGNOSIS — C3491 Malignant neoplasm of unspecified part of right bronchus or lung: Secondary | ICD-10-CM

## 2022-08-02 DIAGNOSIS — Z510 Encounter for antineoplastic radiation therapy: Secondary | ICD-10-CM | POA: Diagnosis not present

## 2022-08-02 LAB — RAD ONC ARIA SESSION SUMMARY
Course Elapsed Days: 18
Plan Fractions Treated to Date: 15
Plan Prescribed Dose Per Fraction: 2 Gy
Plan Total Fractions Prescribed: 30
Plan Total Prescribed Dose: 60 Gy
Reference Point Dosage Given to Date: 30 Gy
Reference Point Session Dosage Given: 2 Gy
Session Number: 15

## 2022-08-02 MED ORDER — HEPARIN SOD (PORK) LOCK FLUSH 100 UNIT/ML IV SOLN
250.0000 [IU] | Freq: Once | INTRAVENOUS | Status: AC
  Administered 2022-08-02: 250 [IU]

## 2022-08-02 MED ORDER — SODIUM CHLORIDE 0.9% FLUSH
10.0000 mL | Freq: Once | INTRAVENOUS | Status: AC
  Administered 2022-08-02: 10 mL

## 2022-08-02 MED FILL — Dexamethasone Sodium Phosphate Inj 100 MG/10ML: INTRAMUSCULAR | Qty: 1 | Status: AC

## 2022-08-02 NOTE — Patient Instructions (Signed)

## 2022-08-02 NOTE — Progress Notes (Signed)
Patient will be seen in the infusion suite for IV fluids.  He will receive 1 liter or normal saline over 1 hour per MD orders.  Lind Covert RN, BSN

## 2022-08-03 ENCOUNTER — Other Ambulatory Visit: Payer: Self-pay | Admitting: Internal Medicine

## 2022-08-05 ENCOUNTER — Inpatient Hospital Stay: Payer: Medicare HMO | Admitting: Nutrition

## 2022-08-05 ENCOUNTER — Inpatient Hospital Stay: Payer: Medicare HMO

## 2022-08-05 ENCOUNTER — Inpatient Hospital Stay: Payer: Medicare HMO | Attending: Hematology and Oncology | Admitting: Hematology and Oncology

## 2022-08-05 ENCOUNTER — Encounter: Payer: Self-pay | Admitting: Hematology and Oncology

## 2022-08-05 ENCOUNTER — Other Ambulatory Visit: Payer: Self-pay

## 2022-08-05 ENCOUNTER — Ambulatory Visit
Admission: RE | Admit: 2022-08-05 | Discharge: 2022-08-05 | Disposition: A | Discharge status: Home / Self Care | Payer: Medicare HMO | Source: Ambulatory Visit | Attending: Radiation Oncology | Admitting: Radiation Oncology

## 2022-08-05 VITALS — BP 145/65 | HR 63 | Temp 97.7°F | Resp 18 | Ht 70.0 in | Wt 128.4 lb

## 2022-08-05 DIAGNOSIS — Z7901 Long term (current) use of anticoagulants: Secondary | ICD-10-CM | POA: Insufficient documentation

## 2022-08-05 DIAGNOSIS — D61818 Other pancytopenia: Secondary | ICD-10-CM

## 2022-08-05 DIAGNOSIS — R634 Abnormal weight loss: Secondary | ICD-10-CM | POA: Diagnosis not present

## 2022-08-05 DIAGNOSIS — R110 Nausea: Secondary | ICD-10-CM | POA: Diagnosis not present

## 2022-08-05 DIAGNOSIS — C3480 Malignant neoplasm of overlapping sites of unspecified bronchus and lung: Secondary | ICD-10-CM | POA: Diagnosis present

## 2022-08-05 DIAGNOSIS — C3491 Malignant neoplasm of unspecified part of right bronchus or lung: Secondary | ICD-10-CM | POA: Diagnosis not present

## 2022-08-05 DIAGNOSIS — C3490 Malignant neoplasm of unspecified part of unspecified bronchus or lung: Secondary | ICD-10-CM | POA: Insufficient documentation

## 2022-08-05 DIAGNOSIS — Z7951 Long term (current) use of inhaled steroids: Secondary | ICD-10-CM | POA: Insufficient documentation

## 2022-08-05 DIAGNOSIS — Z5111 Encounter for antineoplastic chemotherapy: Secondary | ICD-10-CM | POA: Diagnosis present

## 2022-08-05 DIAGNOSIS — K5909 Other constipation: Secondary | ICD-10-CM | POA: Insufficient documentation

## 2022-08-05 DIAGNOSIS — Z510 Encounter for antineoplastic radiation therapy: Secondary | ICD-10-CM | POA: Insufficient documentation

## 2022-08-05 DIAGNOSIS — R531 Weakness: Secondary | ICD-10-CM | POA: Diagnosis not present

## 2022-08-05 DIAGNOSIS — Z79899 Other long term (current) drug therapy: Secondary | ICD-10-CM | POA: Diagnosis not present

## 2022-08-05 LAB — RAD ONC ARIA SESSION SUMMARY
Course Elapsed Days: 21
Plan Fractions Treated to Date: 16
Plan Prescribed Dose Per Fraction: 2 Gy
Plan Total Fractions Prescribed: 30
Plan Total Prescribed Dose: 60 Gy
Reference Point Dosage Given to Date: 32 Gy
Reference Point Session Dosage Given: 2 Gy
Session Number: 16

## 2022-08-05 LAB — CMP (CANCER CENTER ONLY)
ALT: 37 U/L (ref 0–44)
AST: 29 U/L (ref 15–41)
Albumin: 2.8 g/dL — ABNORMAL LOW (ref 3.5–5.0)
Alkaline Phosphatase: 80 U/L (ref 38–126)
Anion gap: 5 (ref 5–15)
BUN: 9 mg/dL (ref 8–23)
CO2: 22 mmol/L (ref 22–32)
Calcium: 8.1 mg/dL — ABNORMAL LOW (ref 8.9–10.3)
Chloride: 107 mmol/L (ref 98–111)
Creatinine: 0.82 mg/dL (ref 0.61–1.24)
GFR, Estimated: >60 mL/min (ref >60)
Glucose, Bld: 120 mg/dL — ABNORMAL HIGH (ref 70–99)
Potassium: 4.2 mmol/L (ref 3.5–5.1)
Sodium: 134 mmol/L — ABNORMAL LOW (ref 135–145)
Total Bilirubin: 0.5 mg/dL (ref 0.3–1.2)
Total Protein: 6.6 g/dL (ref 6.5–8.1)

## 2022-08-05 LAB — CBC WITH DIFFERENTIAL (CANCER CENTER ONLY)
Abs Immature Granulocytes: 0.03 10*3/uL (ref 0.00–0.07)
Basophils Absolute: 0 10*3/uL (ref 0.0–0.1)
Basophils Relative: 1 %
Eosinophils Absolute: 0 10*3/uL (ref 0.0–0.5)
Eosinophils Relative: 1 %
HCT: 29.1 % — ABNORMAL LOW (ref 39.0–52.0)
Hemoglobin: 9.2 g/dL — ABNORMAL LOW (ref 13.0–17.0)
Immature Granulocytes: 2 %
Lymphocytes Relative: 12 %
Lymphs Abs: 0.2 10*3/uL — ABNORMAL LOW (ref 0.7–4.0)
MCH: 25.6 pg — ABNORMAL LOW (ref 26.0–34.0)
MCHC: 31.6 g/dL (ref 30.0–36.0)
MCV: 81.1 fL (ref 80.0–100.0)
Monocytes Absolute: 0.3 10*3/uL (ref 0.1–1.0)
Monocytes Relative: 20 %
Neutro Abs: 1 10*3/uL — ABNORMAL LOW (ref 1.7–7.7)
Neutrophils Relative %: 64 %
Platelet Count: 224 10*3/uL (ref 150–400)
RBC: 3.59 MIL/uL — ABNORMAL LOW (ref 4.22–5.81)
RDW: 20.5 % — ABNORMAL HIGH (ref 11.5–15.5)
WBC Count: 1.6 10*3/uL — ABNORMAL LOW (ref 4.0–10.5)
nRBC: 0 % (ref 0.0–0.2)

## 2022-08-05 MED ORDER — HEPARIN SOD (PORK) LOCK FLUSH 100 UNIT/ML IV SOLN
500.0000 [IU] | Freq: Once | INTRAVENOUS | Status: AC | PRN
  Administered 2022-08-05: 500 [IU]

## 2022-08-05 MED ORDER — SODIUM CHLORIDE 0.9% FLUSH
10.0000 mL | Freq: Once | INTRAVENOUS | Status: AC | PRN
  Administered 2022-08-05: 10 mL

## 2022-08-05 MED ORDER — ALTEPLASE 2 MG IJ SOLR: 2.0000 mg | Freq: Once | INTRAMUSCULAR | Status: DC | PRN

## 2022-08-05 MED ORDER — SODIUM CHLORIDE 0.9 % IV SOLN: Freq: Once | INTRAVENOUS | Status: AC

## 2022-08-05 NOTE — Patient Instructions (Signed)

## 2022-08-05 NOTE — Assessment & Plan Note (Signed)
He has recent profound weight loss, low protein status and poor oral intake We discussed importance of additional nutritional supplement as tolerated We will get him to follow-up with dietitian today As above, we will cancel his treatment and focus on supportive care only

## 2022-08-05 NOTE — Assessment & Plan Note (Signed)
He is very weak and frail He is not strong enough to endure chemotherapy today He can continue radiation I recommend IV fluid support today and he is in agreement to get IV fluids 3 times a week on Mondays Wednesday and Friday this week I will reassess again next week We discussed importance of frequent small meals

## 2022-08-05 NOTE — Assessment & Plan Note (Signed)
This is worse due to his poor nutrition and recent weight loss I will hold off treatment today Continue supportive care

## 2022-08-05 NOTE — Progress Notes (Signed)
Per Dr. Bertis Ruddy, "Hold chemotherapy today d/t pt not doing well.  Pt will get 1L NS over 2hrs".

## 2022-08-05 NOTE — Progress Notes (Signed)
[facility] OFFICE PROGRESS NOTE  Patient Care Team: [name], [name], [name], [name]  ASSESSMENT & PLAN:  Non-small cell lung cancer [facility]) He is very weak and frail He is not strong enough to endure chemotherapy today He can continue radiation I recommend IV fluid support today and he is in agreement to get IV fluids 3 times a week on Mondays Wednesday and Friday this week I will reassess again next week We discussed importance of frequent small meals  WEIGHT LOSS He has recent profound weight loss, low protein status and poor oral intake We discussed importance of additional nutritional supplement as tolerated We will get him to follow-up with dietitian today As above, we will cancel his treatment and focus on supportive care only  Pancytopenia, acquired ([name]) This is worse due to his poor nutrition and recent weight loss I will hold off treatment today Continue supportive care  No orders of the defined types were placed in this encounter.   All questions were answered. The patient knows to call the clinic with any problems, questions or concerns. The total time spent in the appointment was 40 minutes encounter with patients including review of chart and various tests results, discussions about plan of care and coordination of care plan   Artis Delay, MD 08/05/2022 10:58 AM  INTERVAL HISTORY: Please see below for problem oriented charting. he returns for treatment follow-up seen prior to cycle 4 of treatment He is here accompanied by his wife He has lost weight His oral intake is very poor Last Friday, he was sent to receive IV fluids due to hypotension He continues to feel weak He has occasional nausea but no vomiting Denies constipation No recent fever, chills or signs of infection  REVIEW OF SYSTEMS:    Constitutional: Denies fevers, chills  Eyes: Denies blurriness of vision Ears, nose, mouth, throat, and face: Denies mucositis or sore throat Respiratory: Denies cough, dyspnea or wheezes Cardiovascular: Denies palpitation, chest discomfort or lower extremity swelling Skin: Denies abnormal skin rashes Lymphatics: Denies new lymphadenopathy or easy bruising Neurological:Denies numbness, tingling or new weaknesses Behavioral/Psych: Mood is stable, no new changes  All other systems were reviewed with the patient and are negative.  I have reviewed the past medical history, past surgical history, social history and family history with the patient and they are unchanged from previous note.  ALLERGIES:  has No Known Allergies.  MEDICATIONS:  Current Outpatient Medications  Medication Sig Dispense Refill   acetaminophen (TYLENOL) 500 MG tablet Take 500-1,000 mg by mouth every 8 (eight) hours as needed (for pain).      amiodarone (PACERONE) 400 MG tablet Take 0.5 tablets (200 mg total) by mouth 2 (two) times daily. 28 tablet 0   atorvastatin (LIPITOR) 80 MG tablet TAKE 1 TABLET BY MOUTH DAILY AT 6 PM (Patient taking differently: Take 80 mg by mouth daily. TAKE 1 TABLET BY MOUTH  DAILY AT 6 PM) 90 tablet 3   lidocaine-prilocaine (EMLA) cream Apply to affected area once 30 g 3   melatonin 5 MG TABS Take 1 tablet (5 mg total) by mouth at bedtime. 30 tablet 0   metoprolol succinate (TOPROL-XL) 50 MG 24 hr tablet Take 1 tablet (50 mg total) by mouth 2 (two) times daily. Take with or immediately following a meal. 30 tablet 3   mometasone-formoterol (DULERA) 100-5 MCG/ACT  AERO Inhale 2 puffs into the lungs in the morning and at bedtime. 8.8 each 1   Multiple Vitamin (MULTIVITAMIN) capsule Take 1 capsule by mouth daily.     ondansetron (ZOFRAN) 8 MG tablet Take 1 tablet (8 mg total) by mouth every 8 (eight) hours as needed for nausea. 30 tablet 3   pantoprazole (PROTONIX) 40 MG tablet Take 40 mg by mouth  daily.     prochlorperazine (COMPAZINE) 10 MG tablet Take 1 tablet (10 mg total) by mouth every 6 (six) hours as needed for nausea or vomiting. 30 tablet 1   rivaroxaban (XARELTO) 20 MG TABS tablet Take 1 tablet (20 mg total) by mouth daily with supper. 90 tablet 1   sacubitril-valsartan (ENTRESTO) 24-26 MG Take 1 tablet by mouth 2 (two) times daily. 180 tablet 3   senna-docusate (SENOKOT-S) 8.6-50 MG tablet Take 2 tablets by mouth daily. 60 tablet 1   spironolactone (ALDACTONE) 25 MG tablet Take 1 tablet (25 mg total) by mouth daily. 30 tablet 3   No current facility-administered medications for this visit.   Facility-Administered Medications Ordered in Other Visits  Medication Dose Route Frequency Provider Last Rate Last Admin   0.9 %  sodium chloride infusion   Intravenous Once Bertis Ruddy, Dajanae Brophy, MD       alteplase (CATHFLO ACTIVASE) injection 2 mg  2 mg Intracatheter Once PRN Bertis Ruddy, Starlit Raburn, MD       heparin lock flush 100 unit/mL  500 Units Intracatheter Once PRN Bertis Ruddy, Jovannie Ulibarri, MD       sodium chloride flush (NS) 0.9 % injection 10 mL  10 mL Intracatheter Once PRN Artis Delay, MD        SUMMARY OF ONCOLOGIC HISTORY: Oncology History Overview Note  Foundation One test showed no actionable mutations. 6TMB, MSI low   Non-small cell lung cancer ([name])  06/11/2022 Initial Diagnosis   Non-small cell lung cancer ([name])   06/20/2022 Pathology Results   CYTOLOGY - NON PAP  CASE: [id number]  PATIENT: Rahman Toor  Non-Gynecological Cytology Report   Clinical History: Right hilar mass  Specimen Submitted:  A. LUNG, RIGHT HILAR MASS, FINE NEEDLE ASPIRATION:    FINAL MICROSCOPIC DIAGNOSIS:  - Malignant cells consistent with non-small cell carcinoma  - See comment   SPECIMEN ADEQUACY:  Satisfactory for evaluation   DIAGNOSTIC COMMENTS:  The specimen contains atypical cells with malignant appearance characterized by nuclear enlargement, pleomorphism, clumped chromatin and occasional nucleoli.  Immunohistochemical stains were attempted however no cells were present on the stains. The morphology does not support a small cell carcinoma with differential diagnosis including adenocarcinoma, large cell neuroendocrine carcinoma, or large cell carcinoma, et al. More diagnostic material is needed for further classification and molecular tests.    07/02/2022 PET scan   1. Right suprahilar hypermetabolic mass, consistent with primary lung malignancy. 2. Subcarinal hypermetabolic activity which is contiguous with right hilar mass, possibly due to mediastinal extension or subcarinal lymphadenopathy. 3. No evidence of metastatic disease in the abdomen or pelvis. 4. Right adrenal gland mass with low-level FDG uptake, unchanged when compared with prior exams dating back to 2018 and consistent with benign adenoma. 5. Hypermetabolic right paraspinal consolidation, likely infectious or inflammatory. 6. Aortic Atherosclerosis (ICD10-I70.0) and Emphysema (ICD10-J43.9).     07/02/2022 Cancer Staging   Staging form: Lung, AJCC 8th Edition - Clinical stage from 07/02/2022: Stage IIIA (cT2, cN2, cM0) - Signed by Artis Delay, MD on 07/02/2022 Stage prefix: Initial diagnosis   07/09/2022 Procedure   Successful placement of a  right internal jugular approach power injectable Port-A-Cath. The catheter is ready for immediate use.     07/15/2022 -  Chemotherapy   Patient is on Treatment Plan : LUNG Carboplatin + Paclitaxel + XRT q7d       PHYSICAL EXAMINATION: ECOG PERFORMANCE STATUS: 2 - Symptomatic, <50% confined to bed  Vitals:   08/05/22 1038  BP: (!) 145/65  Pulse: 63  Resp: 18  Temp: 97.7 F (36.5 C)  SpO2: 100%   Filed Weights   08/05/22 1038  Weight: 128 lb 6.4 oz (58.2 kg)    GENERAL:alert, no distress and comfortable.  He appears weak and frail NEURO: alert & oriented x 3 with fluent speech, no focal motor/sensory deficits  LABORATORY DATA:  I have reviewed the data as listed     Component Value Date/Time   NA 132 (L) 07/29/2022 0937   NA 138 04/15/2022 0913   K 4.3 07/29/2022 0937   CL 103 07/29/2022 0937   CO2 24 07/29/2022 0937   GLUCOSE 120 (H) 07/29/2022 0937   BUN 13 07/29/2022 0937   BUN 7 (L) 04/15/2022 0913   CREATININE 0.85 07/29/2022 0937   CREATININE 0.88 09/15/2015 0959   CALCIUM 8.4 (L) 07/29/2022 0937   PROT 6.3 (L) 07/29/2022 0937   PROT 6.8 11/07/2020 1303   ALBUMIN 2.6 (L) 07/29/2022 0937   ALBUMIN 3.6 (L) 11/07/2020 1303   AST 23 07/29/2022 0937   ALT 25 07/29/2022 0937   ALKPHOS 75 07/29/2022 0937   BILITOT 0.6 07/29/2022 0937   GFRNONAA >60 07/29/2022 0937   GFRAA 96 03/17/2020 1016    No results found for: "SPEP", "UPEP"  Lab Results  Component Value Date   WBC 1.6 (L) 08/05/2022   NEUTROABS PENDING 08/05/2022   HGB 9.2 (L) 08/05/2022   HCT 29.1 (L) 08/05/2022   MCV 81.1 08/05/2022   PLT 224 08/05/2022      Chemistry      Component Value Date/Time   NA 132 (L) 07/29/2022 0937   NA 138 04/15/2022 0913   K 4.3 07/29/2022 0937   CL 103 07/29/2022 0937   CO2 24 07/29/2022 0937   BUN 13 07/29/2022 0937   BUN 7 (L) 04/15/2022 0913   CREATININE 0.85 07/29/2022 0937   CREATININE 0.88 09/15/2015 0959      Component Value Date/Time   CALCIUM 8.4 (L) 07/29/2022 0937   ALKPHOS 75 07/29/2022 0937   AST 23 07/29/2022 0937   ALT 25 07/29/2022 0937   BILITOT 0.6 07/29/2022 4098

## 2022-08-05 NOTE — Progress Notes (Signed)
Nutrition follow-up completed with patient receiving treatment for non-small cell lung cancer.  He is followed by Dr. Bertis Ruddy.  He is receiving concurrent chemoradiation therapy.  Weight decreased and documented as 128 pounds 6.4 ounces on July 1 down from 133 pounds 3.2 ounces June 17.  Labs noted: Sodium 134, glucose 120, albumin 2.8 on July 1.  Patient completed approximately 16 out of 30 radiation treatments. Reports decreased appetite.  States he is weak and can hardly walk.  He has had constipation lately.  States food just does not taste good to him anymore.  Finds it difficult to chew and has had trouble wearing his dentures.  Noted some abdominal pain.  Reports he drinks 1 nutrition drink a day which could be Carn[employer] breakfast essentials or boost plus.  Patient still enjoys some of food preferences stated at last visit including grilled cheese and peanut butter and jelly sandwiches.  Nutrition diagnosis: Unintentional weight loss continues.  Intervention: Reviewed strategies to improve taste alterations. Recommended bowel regimen and increased fluids. Encouraged increased calories and protein. Recommended increase oral nutrition supplements to 2-3 cartons daily as tolerated. Reviewed strategies for improving chewing and swallowing. Provided copies of nutrition facts sheets with contact information.  Also provided coupons.  Monitoring, evaluation, goals: Patient will tolerate increased calories and protein to minimize weight loss.  Next visit: Monday, July 15, during infusion with Joli.  **Disclaimer: This note was dictated with voice recognition software. Similar sounding words can inadvertently be transcribed and this note may contain transcription errors which may not have been corrected upon publication of note.**

## 2022-08-05 NOTE — Telephone Encounter (Signed)
Pt's pharmacy is requesting a refill on furosemide. This medication was D/C because pt stated he was not taking, provider did not D/C this medication. Is pt supposed to still be taking this medication? Please address

## 2022-08-06 ENCOUNTER — Ambulatory Visit
Admission: RE | Admit: 2022-08-06 | Discharge: 2022-08-06 | Disposition: A | Discharge status: Home / Self Care | Payer: Medicare HMO | Source: Ambulatory Visit | Attending: Radiation Oncology | Admitting: Radiation Oncology

## 2022-08-06 ENCOUNTER — Other Ambulatory Visit: Payer: Self-pay

## 2022-08-06 DIAGNOSIS — Z510 Encounter for antineoplastic radiation therapy: Secondary | ICD-10-CM | POA: Diagnosis not present

## 2022-08-06 LAB — RAD ONC ARIA SESSION SUMMARY
Course Elapsed Days: 22
Plan Fractions Treated to Date: 17
Plan Prescribed Dose Per Fraction: 2 Gy
Plan Total Fractions Prescribed: 30
Plan Total Prescribed Dose: 60 Gy
Reference Point Dosage Given to Date: 34 Gy
Reference Point Session Dosage Given: 2 Gy
Session Number: 17

## 2022-08-07 ENCOUNTER — Other Ambulatory Visit: Payer: Self-pay

## 2022-08-07 ENCOUNTER — Ambulatory Visit
Admission: RE | Admit: 2022-08-07 | Discharge: 2022-08-07 | Disposition: A | Discharge status: Home / Self Care | Payer: Medicare HMO | Source: Ambulatory Visit | Attending: Radiation Oncology | Admitting: Radiation Oncology

## 2022-08-07 ENCOUNTER — Inpatient Hospital Stay: Payer: Medicare HMO

## 2022-08-07 VITALS — BP 119/53 | HR 74 | Resp 16

## 2022-08-07 DIAGNOSIS — C3491 Malignant neoplasm of unspecified part of right bronchus or lung: Secondary | ICD-10-CM

## 2022-08-07 DIAGNOSIS — Z510 Encounter for antineoplastic radiation therapy: Secondary | ICD-10-CM | POA: Diagnosis not present

## 2022-08-07 DIAGNOSIS — Z5111 Encounter for antineoplastic chemotherapy: Secondary | ICD-10-CM | POA: Diagnosis not present

## 2022-08-07 LAB — RAD ONC ARIA SESSION SUMMARY
Course Elapsed Days: 23
Plan Fractions Treated to Date: 18
Plan Prescribed Dose Per Fraction: 2 Gy
Plan Total Fractions Prescribed: 30
Plan Total Prescribed Dose: 60 Gy
Reference Point Dosage Given to Date: 36 Gy
Reference Point Session Dosage Given: 2 Gy
Session Number: 18

## 2022-08-07 MED ORDER — HEPARIN SOD (PORK) LOCK FLUSH 100 UNIT/ML IV SOLN
500.0000 [IU] | Freq: Once | INTRAVENOUS | Status: AC | PRN
  Administered 2022-08-07: 500 [IU]

## 2022-08-07 MED ORDER — SODIUM CHLORIDE 0.9 % IV SOLN: Freq: Once | INTRAVENOUS | Status: AC

## 2022-08-07 MED ORDER — SODIUM CHLORIDE 0.9% FLUSH
10.0000 mL | Freq: Once | INTRAVENOUS | Status: AC | PRN
  Administered 2022-08-07: 10 mL

## 2022-08-07 NOTE — Patient Instructions (Signed)
Dehydration, Adult Dehydration is a condition in which there is not enough water or other fluids in the body. This happens when a person loses more fluids than they take in. Important organs cannot work right without the right amount of fluids. Any loss of fluids from the body can cause dehydration. Dehydration can be mild, worse, or very bad. It should be treated right away to keep it from getting very bad. What are the causes? Conditions that cause loss of water in the body. They include: Watery poop (diarrhea). Vomiting. Sweating a lot. Fever. Infection. Peeing (urinating) a lot. Not drinking enough fluids. Certain medicines, such as medicines that take extra fluid out of the body (diuretics). Lack of safe drinking water. Not being able to get enough water and food. What increases the risk? Having a long-term (chronic) illness that has not been treated the right way, such as: Diabetes. Heart disease. Kidney disease. Being 65 years of age or older. Having a disability. Living in a place that is high above the ground or sea (high in altitude). The thinner, drier air causes more fluid loss. Doing exercises that put stress on your body for a long time. Being active when in hot places. What are the signs or symptoms? Symptoms of dehydration depend on how bad it is. Mild or worse dehydration Thirst. Dry lips or dry mouth. Feeling dizzy or light-headed. Muscle cramps. Passing little pee or dark pee. Pee may be the color of tea. Headache. Very bad dehydration Changes in skin. Skin may: Be cold to the touch (clammy). Be blotchy or pale. Not go back to normal right after you pinch it and let it go. Little or no tears, pee, or sweat. Fast breathing. Low blood pressure. Weak pulse. Pulse that is more than 100 beats a minute when you are sitting still. Other changes, such as: Feeling very thirsty. Eyes that look hollow (sunken). Cold hands and feet. Being confused. Being very  tired (lethargic) or having trouble waking from sleep. Losing weight. Loss of consciousness. How is this treated? Treatment for this condition depends on how bad your dehydration is. Treatment should start right away. Do not wait until your condition gets very bad. Very bad dehydration is an emergency. You will need to go to a hospital. Mild or worse dehydration can be treated at home. You may be asked to: Drink more fluids. Drink an oral rehydration solution (ORS). This drink gives you the right amount of fluids, salts, and minerals (electrolytes). Very bad dehydration can be treated: With fluids through an IV tube. By correcting low levels of electrolytes in the body. By treating the problem that caused your dehydration. Follow these instructions at home: Oral rehydration solution If told by your doctor, drink an ORS: Make an ORS. Use instructions on the package. Start by drinking small amounts, about  cup (120 mL) every 5-10 minutes. Slowly drink more until you have had the amount that your doctor said to have.  Eating and drinking  Drink enough clear fluid to keep your pee pale yellow. If you were told to drink an ORS, finish the ORS first. Then, start slowly drinking other clear fluids. Drink fluids such as: Water. Do not drink only water. Doing that can make the salt (sodium) level in your body get too low. Water from ice chips you suck on. Fruit juice that you have added water to (diluted). Low-calorie sports drinks. Eat foods that have the right amounts of salts and minerals, such as bananas, oranges, potatoes,   tomatoes, or spinach. Do not drink alcohol. Avoid drinks that have caffeine or sugar. These include:: High-calorie sports drinks. Fruit juice that you did not add water to. Soda. Coffee or energy drinks. Avoid foods that are greasy or have a lot of fat or sugar. General instructions Take over-the-counter and prescription medicines only as told by your doctor. Do  not take sodium tablets. Doing that can make the salt level in your body get too high. Return to your normal activities as told by your doctor. Ask your doctor what activities are safe for you. Keep all follow-up visits. Your doctor may check and change your treatment. Contact a doctor if: You have pain in your belly (abdomen) and the pain: Gets worse. Stays in one place. You have a rash. You have a stiff neck. You get angry or annoyed more easily than normal. You are more tired or have a harder time waking than normal. You feel weak or dizzy. You feel very thirsty. Get help right away if: You have any symptoms of very bad dehydration. You vomit every time you eat or drink. Your vomiting gets worse, does not go away, or you vomit blood or green stuff. You are getting treatment, but symptoms are getting worse. You have a fever. You have a very bad headache. You have: Diarrhea that gets worse or does not go away. Blood in your poop (stool). This may cause poop to look black and tarry. No pee in 6-8 hours. Only a small amount of pee in 6-8 hours, and the pee is very dark. You have trouble breathing. These symptoms may be an emergency. Get help right away. Call 911. Do not wait to see if the symptoms will go away. Do not drive yourself to the hospital. This information is not intended to replace advice given to you by your health care provider. Make sure you discuss any questions you have with your health care provider. Document Revised: 08/20/2021 Document Reviewed: 08/20/2021 Elsevier Patient Education  2024 [employer]  

## 2022-08-09 ENCOUNTER — Other Ambulatory Visit: Payer: Self-pay

## 2022-08-09 ENCOUNTER — Ambulatory Visit
Admission: RE | Admit: 2022-08-09 | Discharge: 2022-08-09 | Disposition: A | Discharge status: Home / Self Care | Payer: Medicare HMO | Source: Ambulatory Visit | Attending: Radiation Oncology | Admitting: Radiation Oncology

## 2022-08-09 ENCOUNTER — Inpatient Hospital Stay: Payer: Medicare HMO

## 2022-08-09 ENCOUNTER — Ambulatory Visit: Admission: RE | Admit: 2022-08-09 | Payer: Medicare HMO | Source: Ambulatory Visit

## 2022-08-09 DIAGNOSIS — Z510 Encounter for antineoplastic radiation therapy: Secondary | ICD-10-CM | POA: Diagnosis not present

## 2022-08-09 DIAGNOSIS — C3480 Malignant neoplasm of overlapping sites of unspecified bronchus and lung: Secondary | ICD-10-CM

## 2022-08-09 DIAGNOSIS — Z5111 Encounter for antineoplastic chemotherapy: Secondary | ICD-10-CM | POA: Diagnosis not present

## 2022-08-09 LAB — RAD ONC ARIA SESSION SUMMARY
Course Elapsed Days: 25
Plan Fractions Treated to Date: 19
Plan Prescribed Dose Per Fraction: 2 Gy
Plan Total Fractions Prescribed: 30
Plan Total Prescribed Dose: 60 Gy
Reference Point Dosage Given to Date: 38 Gy
Reference Point Session Dosage Given: 2 Gy
Session Number: 19

## 2022-08-09 MED ORDER — SODIUM CHLORIDE 0.9% FLUSH
10.0000 mL | INTRAVENOUS | Status: DC | PRN
  Administered 2022-08-09: 10 mL via INTRAVENOUS

## 2022-08-09 MED ORDER — HEPARIN SOD (PORK) LOCK FLUSH 100 UNIT/ML IV SOLN
500.0000 [IU] | Freq: Once | INTRAVENOUS | Status: AC
  Administered 2022-08-09: 500 [IU] via INTRAVENOUS

## 2022-08-09 MED ORDER — SODIUM CHLORIDE 0.9 % IV SOLN: INTRAVENOUS | Status: DC

## 2022-08-09 MED FILL — Dexamethasone Sodium Phosphate Inj 100 MG/10ML: INTRAMUSCULAR | Qty: 1 | Status: AC

## 2022-08-12 ENCOUNTER — Inpatient Hospital Stay: Payer: Medicare HMO | Admitting: Hematology and Oncology

## 2022-08-12 ENCOUNTER — Ambulatory Visit
Admission: RE | Admit: 2022-08-12 | Discharge: 2022-08-12 | Disposition: A | Discharge status: Home / Self Care | Payer: Medicare HMO | Source: Ambulatory Visit | Attending: Radiation Oncology | Admitting: Radiation Oncology

## 2022-08-12 ENCOUNTER — Inpatient Hospital Stay: Payer: Medicare HMO

## 2022-08-12 ENCOUNTER — Other Ambulatory Visit: Payer: Self-pay | Admitting: Hematology and Oncology

## 2022-08-12 ENCOUNTER — Encounter: Payer: Self-pay | Admitting: Hematology and Oncology

## 2022-08-12 ENCOUNTER — Other Ambulatory Visit: Payer: Self-pay

## 2022-08-12 VITALS — BP 142/74 | HR 91 | Resp 18

## 2022-08-12 VITALS — BP 132/67 | HR 86 | Temp 97.6°F | Resp 17 | Wt 135.2 lb

## 2022-08-12 DIAGNOSIS — Z510 Encounter for antineoplastic radiation therapy: Secondary | ICD-10-CM | POA: Diagnosis not present

## 2022-08-12 DIAGNOSIS — D61818 Other pancytopenia: Secondary | ICD-10-CM

## 2022-08-12 DIAGNOSIS — C3491 Malignant neoplasm of unspecified part of right bronchus or lung: Secondary | ICD-10-CM

## 2022-08-12 DIAGNOSIS — T7840XA Allergy, unspecified, initial encounter: Secondary | ICD-10-CM | POA: Diagnosis not present

## 2022-08-12 DIAGNOSIS — K5909 Other constipation: Secondary | ICD-10-CM

## 2022-08-12 DIAGNOSIS — Z5111 Encounter for antineoplastic chemotherapy: Secondary | ICD-10-CM | POA: Diagnosis not present

## 2022-08-12 LAB — CBC WITH DIFFERENTIAL (CANCER CENTER ONLY)
Abs Immature Granulocytes: 0.36 10*3/uL — ABNORMAL HIGH (ref 0.00–0.07)
Basophils Absolute: 0.1 10*3/uL (ref 0.0–0.1)
Basophils Relative: 1 %
Eosinophils Absolute: 0 10*3/uL (ref 0.0–0.5)
Eosinophils Relative: 1 %
HCT: 28.8 % — ABNORMAL LOW (ref 39.0–52.0)
Hemoglobin: 9.2 g/dL — ABNORMAL LOW (ref 13.0–17.0)
Immature Granulocytes: 5 %
Lymphocytes Relative: 6 %
Lymphs Abs: 0.4 10*3/uL — ABNORMAL LOW (ref 0.7–4.0)
MCH: 26.5 pg (ref 26.0–34.0)
MCHC: 31.9 g/dL (ref 30.0–36.0)
MCV: 83 fL (ref 80.0–100.0)
Monocytes Absolute: 1 10*3/uL (ref 0.1–1.0)
Monocytes Relative: 15 %
Neutro Abs: 4.8 10*3/uL (ref 1.7–7.7)
Neutrophils Relative %: 72 %
Platelet Count: 231 10*3/uL (ref 150–400)
RBC: 3.47 MIL/uL — ABNORMAL LOW (ref 4.22–5.81)
RDW: 23.4 % — ABNORMAL HIGH (ref 11.5–15.5)
Smear Review: NORMAL
WBC Count: 6.7 10*3/uL (ref 4.0–10.5)
nRBC: 0.3 % — ABNORMAL HIGH (ref 0.0–0.2)

## 2022-08-12 LAB — CMP (CANCER CENTER ONLY)
ALT: 20 U/L (ref 0–44)
AST: 23 U/L (ref 15–41)
Albumin: 2.7 g/dL — ABNORMAL LOW (ref 3.5–5.0)
Alkaline Phosphatase: 82 U/L (ref 38–126)
Anion gap: 5 (ref 5–15)
BUN: 8 mg/dL (ref 8–23)
CO2: 26 mmol/L (ref 22–32)
Calcium: 8.2 mg/dL — ABNORMAL LOW (ref 8.9–10.3)
Chloride: 109 mmol/L (ref 98–111)
Creatinine: 0.78 mg/dL (ref 0.61–1.24)
GFR, Estimated: >60 mL/min (ref >60)
Glucose, Bld: 120 mg/dL — ABNORMAL HIGH (ref 70–99)
Potassium: 3.5 mmol/L (ref 3.5–5.1)
Sodium: 140 mmol/L (ref 135–145)
Total Bilirubin: 0.4 mg/dL (ref 0.3–1.2)
Total Protein: 6.2 g/dL — ABNORMAL LOW (ref 6.5–8.1)

## 2022-08-12 LAB — RAD ONC ARIA SESSION SUMMARY
Course Elapsed Days: 28
Plan Fractions Treated to Date: 20
Plan Prescribed Dose Per Fraction: 2 Gy
Plan Total Fractions Prescribed: 30
Plan Total Prescribed Dose: 60 Gy
Reference Point Dosage Given to Date: 40 Gy
Reference Point Session Dosage Given: 2 Gy
Session Number: 20

## 2022-08-12 MED ORDER — SODIUM CHLORIDE 0.9 % IV SOLN
510.0000 mg | Freq: Once | INTRAVENOUS | Status: AC
  Administered 2022-08-12: 510 mg via INTRAVENOUS
  Filled 2022-08-12: qty 510

## 2022-08-12 MED ORDER — SODIUM CHLORIDE 0.9 % IV SOLN
151.8000 mg | Freq: Once | INTRAVENOUS | Status: AC
  Administered 2022-08-12: 150 mg via INTRAVENOUS
  Filled 2022-08-12: qty 15

## 2022-08-12 MED ORDER — HEPARIN SOD (PORK) LOCK FLUSH 100 UNIT/ML IV SOLN
500.0000 [IU] | Freq: Once | INTRAVENOUS | Status: AC | PRN
  Administered 2022-08-12: 500 [IU]

## 2022-08-12 MED ORDER — SODIUM CHLORIDE 0.9 % IV SOLN
10.0000 mg | Freq: Once | INTRAVENOUS | Status: AC
  Administered 2022-08-12: 10 mg via INTRAVENOUS
  Filled 2022-08-12: qty 10

## 2022-08-12 MED ORDER — SODIUM CHLORIDE 0.9 % IV SOLN: Freq: Once | INTRAVENOUS | Status: AC

## 2022-08-12 MED ORDER — SODIUM CHLORIDE 0.9% FLUSH
10.0000 mL | INTRAVENOUS | Status: DC | PRN
  Administered 2022-08-12: 10 mL

## 2022-08-12 MED ORDER — FAMOTIDINE IN NACL 20-0.9 MG/50ML-% IV SOLN
20.0000 mg | Freq: Once | INTRAVENOUS | Status: AC
  Administered 2022-08-12: 20 mg via INTRAVENOUS
  Filled 2022-08-12: qty 50

## 2022-08-12 MED ORDER — SODIUM CHLORIDE 0.9 % IV SOLN
45.0000 mg/m2 | Freq: Once | INTRAVENOUS | Status: AC
  Administered 2022-08-12: 78 mg via INTRAVENOUS
  Filled 2022-08-12: qty 13

## 2022-08-12 MED ORDER — SODIUM CHLORIDE 0.9% FLUSH
10.0000 mL | Freq: Once | INTRAVENOUS | Status: AC
  Administered 2022-08-12: 10 mL

## 2022-08-12 MED ORDER — PALONOSETRON HCL INJECTION 0.25 MG/5ML
0.2500 mg | Freq: Once | INTRAVENOUS | Status: AC
  Administered 2022-08-12: 0.25 mg via INTRAVENOUS
  Filled 2022-08-12: qty 5

## 2022-08-12 NOTE — Assessment & Plan Note (Signed)
He is prone to get constipated I reinforced the importance of laxatives

## 2022-08-12 NOTE — Progress Notes (Addendum)
[facility] OFFICE PROGRESS NOTE  Patient Care Team: [name], [name], [name], [name]  ASSESSMENT & PLAN:  Non-small cell lung cancer [facility]) The patient recovered nicely since we took a break from chemo last week We will resume chemotherapy today I am hopeful next week would be his last dose of chemotherapy  Pancytopenia, acquired ([name]) This has improved since we took a break from treatment We will resume treatment today  Other constipation He is prone to get constipated I reinforced the importance of laxatives  Acute allergic reaction I was called to evaluate this patient who might have developed reaction to chemotherapy around 2:50 PM after completion of paclitaxel.  The patient complained of chest pain lasting about 15 minutes that resolved with oxygen.  EKG is unchanged compared to his previous EKG from May By 3:15 PM, his chest pain has resolved and his vital signs are stable.  We will resume chemotherapy with carboplatin.  I will enter paclitaxel as potential agent that caused allergic reaction and I will remove the order from his chemotherapy next week  No orders of the defined types were placed in this encounter.   All questions were answered. The patient knows to call the clinic with any problems, questions or concerns. The total time spent in the appointment was 20 minutes encounter with patients including review of chart and various tests results, discussions about plan of care and coordination of care plan   Artis Delay, MD 08/12/2022 3:18 PM  INTERVAL HISTORY: Please see below for problem oriented charting. he returns for treatment follow-up with his wife Since we took a break from treatment last week, he is improving He is able to tolerate more food His blood pressure has improved He is no longer weak He denies  shortness of breath  REVIEW OF SYSTEMS:   Constitutional: Denies fevers, chills or abnormal weight loss Eyes: Denies blurriness of vision Ears, nose, mouth, throat, and face: Denies mucositis or sore throat Respiratory: Denies cough, dyspnea or wheezes Cardiovascular: Denies palpitation, chest discomfort or lower extremity swelling Gastrointestinal:  Denies nausea, heartburn or change in bowel habits Skin: Denies abnormal skin rashes Lymphatics: Denies new lymphadenopathy or easy bruising Neurological:Denies numbness, tingling or new weaknesses Behavioral/Psych: Mood is stable, no new changes  All other systems were reviewed with the patient and are negative.  I have reviewed the past medical history, past surgical history, social history and family history with the patient and they are unchanged from previous note.  ALLERGIES:  is allergic to paclitaxel.  MEDICATIONS:  Current Outpatient Medications  Medication Sig Dispense Refill   furosemide (LASIX) 40 MG tablet TAKE 1/2 TABLET BY MOUTH EVERY DAY 45 tablet 3   acetaminophen (TYLENOL) 500 MG tablet Take 500-1,000 mg by mouth every 8 (eight) hours as needed (for pain).      amiodarone (PACERONE) 400 MG tablet Take 0.5 tablets (200 mg total) by mouth 2 (two) times daily. 28 tablet 0   atorvastatin (LIPITOR) 80 MG tablet TAKE 1 TABLET BY MOUTH DAILY AT 6 PM (Patient taking differently: Take 80 mg by mouth daily. TAKE 1 TABLET BY MOUTH  DAILY AT 6 PM) 90 tablet 3   lidocaine-prilocaine (EMLA) cream Apply to affected area once 30 g 3   melatonin 5 MG TABS Take 1 tablet (5 mg total) by mouth at bedtime. 30 tablet 0  metoprolol succinate (TOPROL-XL) 50 MG 24 hr tablet Take 1 tablet (50 mg total) by mouth 2 (two) times daily. Take with or immediately following a meal. 30 tablet 3   mometasone-formoterol (DULERA) 100-5 MCG/ACT AERO Inhale 2 puffs into the lungs in the morning and at bedtime. 8.8 each 1   Multiple Vitamin (MULTIVITAMIN)  capsule Take 1 capsule by mouth daily.     ondansetron (ZOFRAN) 8 MG tablet Take 1 tablet (8 mg total) by mouth every 8 (eight) hours as needed for nausea. 30 tablet 3   pantoprazole (PROTONIX) 40 MG tablet Take 40 mg by mouth daily.     prochlorperazine (COMPAZINE) 10 MG tablet Take 1 tablet (10 mg total) by mouth every 6 (six) hours as needed for nausea or vomiting. 30 tablet 1   rivaroxaban (XARELTO) 20 MG TABS tablet Take 1 tablet (20 mg total) by mouth daily with supper. 90 tablet 1   sacubitril-valsartan (ENTRESTO) 24-26 MG Take 1 tablet by mouth 2 (two) times daily. 180 tablet 3   senna-docusate (SENOKOT-S) 8.6-50 MG tablet Take 2 tablets by mouth daily. 60 tablet 1   spironolactone (ALDACTONE) 25 MG tablet Take 1 tablet (25 mg total) by mouth daily. 30 tablet 3   No current facility-administered medications for this visit.   Facility-Administered Medications Ordered in Other Visits  Medication Dose Route Frequency Provider Last Rate Last Admin   CARBOplatin (PARAPLATIN) 150 mg in sodium chloride 0.9 % 100 mL chemo infusion  150 mg Intravenous Once Bertis Ruddy, Sharolyn Weber, MD       heparin lock flush 100 unit/mL  500 Units Intracatheter Once PRN Bertis Ruddy, Kellie Murrill, MD       sodium chloride flush (NS) 0.9 % injection 10 mL  10 mL Intracatheter PRN Artis Delay, MD        SUMMARY OF ONCOLOGIC HISTORY: Oncology History Overview Note  Foundation One test showed no actionable mutations. 6TMB, MSI low   Non-small cell lung cancer ([name])  06/11/2022 Initial Diagnosis   Non-small cell lung cancer ([name])   06/20/2022 Pathology Results   CYTOLOGY - NON PAP  CASE: [id number]  PATIENT: Benjamin Cook  Non-Gynecological Cytology Report   Clinical History: Right hilar mass  Specimen Submitted:  A. LUNG, RIGHT HILAR MASS, FINE NEEDLE ASPIRATION:    FINAL MICROSCOPIC DIAGNOSIS:  - Malignant cells consistent with non-small cell carcinoma  - See comment   SPECIMEN ADEQUACY:  Satisfactory for evaluation    DIAGNOSTIC COMMENTS:  The specimen contains atypical cells with malignant appearance characterized by nuclear enlargement, pleomorphism, clumped chromatin and occasional nucleoli. Immunohistochemical stains were attempted however no cells were present on the stains. The morphology does not support a small cell carcinoma with differential diagnosis including adenocarcinoma, large cell neuroendocrine carcinoma, or large cell carcinoma, et al. More diagnostic material is needed for further classification and molecular tests.    07/02/2022 PET scan   1. Right suprahilar hypermetabolic mass, consistent with primary lung malignancy. 2. Subcarinal hypermetabolic activity which is contiguous with right hilar mass, possibly due to mediastinal extension or subcarinal lymphadenopathy. 3. No evidence of metastatic disease in the abdomen or pelvis. 4. Right adrenal gland mass with low-level FDG uptake, unchanged when compared with prior exams dating back to 2018 and consistent with benign adenoma. 5. Hypermetabolic right paraspinal consolidation, likely infectious or inflammatory. 6. Aortic Atherosclerosis (ICD10-I70.0) and Emphysema (ICD10-J43.9).     07/02/2022 Cancer Staging   Staging form: Lung, AJCC 8th Edition - Clinical stage from 07/02/2022: Stage IIIA (cT2, cN2, cM0) -  Signed by Artis Delay, MD on 07/02/2022 Stage prefix: Initial diagnosis   07/09/2022 Procedure   Successful placement of a right internal jugular approach power injectable Port-A-Cath. The catheter is ready for immediate use.     07/15/2022 -  Chemotherapy   Patient is on Treatment Plan : LUNG Carboplatin + Paclitaxel + XRT q7d       PHYSICAL EXAMINATION: ECOG PERFORMANCE STATUS: 2 - Symptomatic, <50% confined to bed  Vitals:   08/12/22 1045  BP: 132/67  Pulse: 86  Resp: 17  Temp: 97.6 F (36.4 C)  SpO2: 99%   Filed Weights   08/12/22 1045  Weight: 135 lb 3.2 oz (61.3 kg)    GENERAL:alert, no distress and  comfortable NEURO: alert & oriented x 3 with fluent speech, no focal motor/sensory deficits  LABORATORY DATA:  I have reviewed the data as listed    Component Value Date/Time   NA 140 08/12/2022 1026   NA 138 04/15/2022 0913   K 3.5 08/12/2022 1026   CL 109 08/12/2022 1026   CO2 26 08/12/2022 1026   GLUCOSE 120 (H) 08/12/2022 1026   BUN 8 08/12/2022 1026   BUN 7 (L) 04/15/2022 0913   CREATININE 0.78 08/12/2022 1026   CREATININE 0.88 09/15/2015 0959   CALCIUM 8.2 (L) 08/12/2022 1026   PROT 6.2 (L) 08/12/2022 1026   PROT 6.8 11/07/2020 1303   ALBUMIN 2.7 (L) 08/12/2022 1026   ALBUMIN 3.6 (L) 11/07/2020 1303   AST 23 08/12/2022 1026   ALT 20 08/12/2022 1026   ALKPHOS 82 08/12/2022 1026   BILITOT 0.4 08/12/2022 1026   GFRNONAA >60 08/12/2022 1026   GFRAA 96 03/17/2020 1016    No results found for: "SPEP", "UPEP"  Lab Results  Component Value Date   WBC 6.7 08/12/2022   NEUTROABS 4.8 08/12/2022   HGB 9.2 (L) 08/12/2022   HCT 28.8 (L) 08/12/2022   MCV 83.0 08/12/2022   PLT 231 08/12/2022      Chemistry      Component Value Date/Time   NA 140 08/12/2022 1026   NA 138 04/15/2022 0913   K 3.5 08/12/2022 1026   CL 109 08/12/2022 1026   CO2 26 08/12/2022 1026   BUN 8 08/12/2022 1026   BUN 7 (L) 04/15/2022 0913   CREATININE 0.78 08/12/2022 1026   CREATININE 0.88 09/15/2015 0959      Component Value Date/Time   CALCIUM 8.2 (L) 08/12/2022 1026   ALKPHOS 82 08/12/2022 1026   AST 23 08/12/2022 1026   ALT 20 08/12/2022 1026   BILITOT 0.4 08/12/2022 1026

## 2022-08-12 NOTE — Assessment & Plan Note (Signed)
I was called to evaluate this patient who might have developed reaction to chemotherapy around 2:50 PM after completion of paclitaxel.  The patient complained of chest pain lasting about 15 minutes that resolved with oxygen.  EKG is unchanged compared to his previous EKG from May By 3:15 PM, his chest pain has resolved and his vital signs are stable.  We will resume chemotherapy with carboplatin.  I will enter paclitaxel as potential agent that caused allergic reaction and I will remove the order from his chemotherapy next week

## 2022-08-12 NOTE — Assessment & Plan Note (Signed)
This has improved since we took a break from treatment We will resume treatment today

## 2022-08-12 NOTE — Addendum Note (Signed)
Addended byBertis Ruddy, Shayonna Ocampo on: 08/12/2022 03:18 PM   Modules accepted: Level of Service

## 2022-08-12 NOTE — Patient Instructions (Signed)
[facility]  Discharge Instructions: Thank you for choosing [facility] to provide your oncology and hematology care.   If you have a lab appointment with the Cancer Center, please go directly to the Cancer Center and check in at the registration area.   Wear comfortable clothing and clothing appropriate for easy access to any Portacath or PICC line.   We strive to give you quality time with your provider. You may need to reschedule your appointment if you arrive late (15 or more minutes).  Arriving late affects you and other patients whose appointments are after yours.  Also, if you miss three or more appointments without notifying the office, you may be dismissed from the clinic at the provider's discretion.      For prescription refill requests, have your pharmacy contact our office and allow 72 hours for refills to be completed.    Today you received the following chemotherapy and/or immunotherapy agents paclitaxel, carboplatin      To help prevent nausea and vomiting after your treatment, we encourage you to take your nausea medication as directed.  BELOW ARE SYMPTOMS THAT SHOULD BE REPORTED IMMEDIATELY: *FEVER GREATER THAN 100.4 F (38 C) OR HIGHER *CHILLS OR SWEATING *NAUSEA AND VOMITING THAT IS NOT CONTROLLED WITH YOUR NAUSEA MEDICATION *UNUSUAL SHORTNESS OF BREATH *UNUSUAL BRUISING OR BLEEDING *URINARY PROBLEMS (pain or burning when urinating, or frequent urination) *BOWEL PROBLEMS (unusual diarrhea, constipation, pain near the anus) TENDERNESS IN MOUTH AND THROAT WITH OR WITHOUT PRESENCE OF ULCERS (sore throat, sores in mouth, or a toothache) UNUSUAL RASH, SWELLING OR PAIN  UNUSUAL VAGINAL DISCHARGE OR ITCHING   Items with * indicate a potential emergency and should be followed up as soon as possible or go to the Emergency Department if any problems should occur.  Please show the CHEMOTHERAPY ALERT CARD or IMMUNOTHERAPY ALERT  CARD at check-in to the Emergency Department and triage nurse.  Should you have questions after your visit or need to cancel or reschedule your appointment, please contact [facility]  Dept: [phone]  and follow the prompts.  Office hours are 8:00 a.m. to 4:30 p.m. Monday - Friday. Please note that voicemails left after 4:00 p.m. may not be returned until the following business day.  We are closed weekends and major holidays. You have access to a nurse at all times for urgent questions. Please call the main number to the clinic Dept: [phone] and follow the prompts.   For any non-urgent questions, you may also contact your provider using MyChart. We now offer e-Visits for anyone 18 and older to request care online for non-urgent symptoms. For details visit [email or web address].   Also download the MyChart app! Go to the app store, search "MyChart", open the app, select [place], and log in with your MyChart username and password. 

## 2022-08-12 NOTE — Progress Notes (Signed)
Hypersensitivity Reaction note  Date of event: 08/12/22 Time of event: 1446 Generic name of drug involved: paclitaxel Name of provider notified of the hypersensitivity reaction: Bertis Ruddy Was agent that likely caused hypersensitivity reaction added to Allergies List within EMR? yes Chain of events including reaction signs/symptoms, treatment administered, and outcome (e.g., drug resumed; drug discontinued; sent to Emergency Department; etc.) Pt c/o sudden chest pain/discomfort with SOB at the completion of D1C4 paclitaxel. Pt SBP and RR elevated. All other vital signs WNL. EKG completed and MD at chairside. Chest pain relieved after 15 minutes and SBP and RR returned to normal. Pt at baseline and carboplatin given as ordered.   [place], RN 08/12/2022 3:25 PM

## 2022-08-12 NOTE — Assessment & Plan Note (Signed)
The patient recovered nicely since we took a break from chemo last week We will resume chemotherapy today I am hopeful next week would be his last dose of chemotherapy

## 2022-08-13 ENCOUNTER — Other Ambulatory Visit: Payer: Self-pay | Admitting: Hematology and Oncology

## 2022-08-13 ENCOUNTER — Other Ambulatory Visit: Payer: Self-pay

## 2022-08-13 ENCOUNTER — Ambulatory Visit
Admission: RE | Admit: 2022-08-13 | Discharge: 2022-08-13 | Disposition: A | Discharge status: Home / Self Care | Payer: Medicare HMO | Source: Ambulatory Visit | Attending: Radiation Oncology | Admitting: Radiation Oncology

## 2022-08-13 ENCOUNTER — Telehealth: Payer: Self-pay | Admitting: *Deleted

## 2022-08-13 DIAGNOSIS — Z510 Encounter for antineoplastic radiation therapy: Secondary | ICD-10-CM | POA: Diagnosis not present

## 2022-08-13 LAB — RAD ONC ARIA SESSION SUMMARY
Course Elapsed Days: 29
Plan Fractions Treated to Date: 21
Plan Prescribed Dose Per Fraction: 2 Gy
Plan Total Fractions Prescribed: 30
Plan Total Prescribed Dose: 60 Gy
Reference Point Dosage Given to Date: 42 Gy
Reference Point Session Dosage Given: 2 Gy
Session Number: 21

## 2022-08-13 NOTE — Telephone Encounter (Signed)
-----   Message from Artis Delay, MD sent at 08/13/2022  9:41 AM EDT ----- He had possible taxol reaction yesterday Can you call if he is feeling ok? Had chest pain that resolved

## 2022-08-13 NOTE — Telephone Encounter (Signed)
Spoke to patient's wife. He had no further chest pain or concerns from his treatment yesterday.  He had some nausea followed by vomiting this morning. He drank his Ensure too fast and stated it was too cold on his empty stomach. Following the emesis, patient had epitaxies that resolved quickly. He took Compazine. Patient was able to eat and drink normally the rest of the day. Discussed some ways to help avoid nausea first thing in the morning. Wife appreciated call. She was advised to reach out to RN with any other questions or concerns, especially if any further bleeding occurs.

## 2022-08-14 ENCOUNTER — Other Ambulatory Visit: Payer: Self-pay

## 2022-08-14 ENCOUNTER — Ambulatory Visit
Admission: RE | Admit: 2022-08-14 | Discharge: 2022-08-14 | Disposition: A | Discharge status: Home / Self Care | Payer: Medicare HMO | Source: Ambulatory Visit | Attending: Radiation Oncology | Admitting: Radiation Oncology

## 2022-08-14 DIAGNOSIS — Z510 Encounter for antineoplastic radiation therapy: Secondary | ICD-10-CM | POA: Diagnosis not present

## 2022-08-14 LAB — RAD ONC ARIA SESSION SUMMARY
Course Elapsed Days: 30
Plan Fractions Treated to Date: 22
Plan Prescribed Dose Per Fraction: 2 Gy
Plan Total Fractions Prescribed: 30
Plan Total Prescribed Dose: 60 Gy
Reference Point Dosage Given to Date: 44 Gy
Reference Point Session Dosage Given: 2 Gy
Session Number: 22

## 2022-08-15 ENCOUNTER — Other Ambulatory Visit: Payer: Self-pay

## 2022-08-15 ENCOUNTER — Ambulatory Visit
Admission: RE | Admit: 2022-08-15 | Discharge: 2022-08-15 | Disposition: A | Discharge status: Home / Self Care | Payer: Medicare HMO | Source: Ambulatory Visit | Attending: Radiation Oncology | Admitting: Radiation Oncology

## 2022-08-15 DIAGNOSIS — Z510 Encounter for antineoplastic radiation therapy: Secondary | ICD-10-CM | POA: Diagnosis not present

## 2022-08-15 LAB — RAD ONC ARIA SESSION SUMMARY
Course Elapsed Days: 31
Plan Fractions Treated to Date: 23
Plan Prescribed Dose Per Fraction: 2 Gy
Plan Total Fractions Prescribed: 30
Plan Total Prescribed Dose: 60 Gy
Reference Point Dosage Given to Date: 46 Gy
Reference Point Session Dosage Given: 2 Gy
Session Number: 23

## 2022-08-16 ENCOUNTER — Ambulatory Visit
Admission: RE | Admit: 2022-08-16 | Discharge: 2022-08-16 | Disposition: A | Discharge status: Home / Self Care | Payer: Medicare HMO | Source: Ambulatory Visit | Attending: Radiation Oncology | Admitting: Radiation Oncology

## 2022-08-16 ENCOUNTER — Other Ambulatory Visit: Payer: Self-pay

## 2022-08-16 DIAGNOSIS — Z510 Encounter for antineoplastic radiation therapy: Secondary | ICD-10-CM | POA: Diagnosis not present

## 2022-08-16 LAB — RAD ONC ARIA SESSION SUMMARY
Course Elapsed Days: 32
Plan Fractions Treated to Date: 24
Plan Prescribed Dose Per Fraction: 2 Gy
Plan Total Fractions Prescribed: 30
Plan Total Prescribed Dose: 60 Gy
Reference Point Dosage Given to Date: 48 Gy
Reference Point Session Dosage Given: 2 Gy
Session Number: 24

## 2022-08-16 MED FILL — Dexamethasone Sodium Phosphate Inj 100 MG/10ML: INTRAMUSCULAR | Qty: 1 | Status: AC

## 2022-08-19 ENCOUNTER — Ambulatory Visit
Admission: RE | Admit: 2022-08-19 | Discharge: 2022-08-19 | Disposition: A | Discharge status: Home / Self Care | Payer: Medicare HMO | Source: Ambulatory Visit | Attending: Radiation Oncology | Admitting: Radiation Oncology

## 2022-08-19 ENCOUNTER — Inpatient Hospital Stay: Payer: Medicare HMO

## 2022-08-19 ENCOUNTER — Encounter: Payer: Self-pay | Admitting: Hematology and Oncology

## 2022-08-19 ENCOUNTER — Other Ambulatory Visit: Payer: Self-pay

## 2022-08-19 ENCOUNTER — Inpatient Hospital Stay (HOSPITAL_BASED_OUTPATIENT_CLINIC_OR_DEPARTMENT_OTHER): Payer: Medicare HMO | Admitting: Hematology and Oncology

## 2022-08-19 VITALS — BP 133/64 | HR 83 | Temp 98.5°F | Resp 20 | Wt 129.8 lb

## 2022-08-19 VITALS — BP 124/61 | HR 79 | Temp 98.6°F | Resp 18

## 2022-08-19 DIAGNOSIS — C3491 Malignant neoplasm of unspecified part of right bronchus or lung: Secondary | ICD-10-CM

## 2022-08-19 DIAGNOSIS — R634 Abnormal weight loss: Secondary | ICD-10-CM | POA: Diagnosis not present

## 2022-08-19 DIAGNOSIS — Z5111 Encounter for antineoplastic chemotherapy: Secondary | ICD-10-CM | POA: Diagnosis not present

## 2022-08-19 DIAGNOSIS — D61818 Other pancytopenia: Secondary | ICD-10-CM

## 2022-08-19 DIAGNOSIS — Z510 Encounter for antineoplastic radiation therapy: Secondary | ICD-10-CM | POA: Diagnosis not present

## 2022-08-19 LAB — CBC WITH DIFFERENTIAL (CANCER CENTER ONLY)
Abs Immature Granulocytes: 0.05 10*3/uL (ref 0.00–0.07)
Basophils Absolute: 0 10*3/uL (ref 0.0–0.1)
Basophils Relative: 1 %
Eosinophils Absolute: 0 10*3/uL (ref 0.0–0.5)
Eosinophils Relative: 1 %
HCT: 26.1 % — ABNORMAL LOW (ref 39.0–52.0)
Hemoglobin: 8.4 g/dL — ABNORMAL LOW (ref 13.0–17.0)
Immature Granulocytes: 1 %
Lymphocytes Relative: 4 %
Lymphs Abs: 0.2 10*3/uL — ABNORMAL LOW (ref 0.7–4.0)
MCH: 26.7 pg (ref 26.0–34.0)
MCHC: 32.2 g/dL (ref 30.0–36.0)
MCV: 82.9 fL (ref 80.0–100.0)
Monocytes Absolute: 0.3 10*3/uL (ref 0.1–1.0)
Monocytes Relative: 6 %
Neutro Abs: 4.7 10*3/uL (ref 1.7–7.7)
Neutrophils Relative %: 87 %
Platelet Count: 138 10*3/uL — ABNORMAL LOW (ref 150–400)
RBC: 3.15 MIL/uL — ABNORMAL LOW (ref 4.22–5.81)
RDW: 23.8 % — ABNORMAL HIGH (ref 11.5–15.5)
WBC Count: 5.4 10*3/uL (ref 4.0–10.5)
nRBC: 0 % (ref 0.0–0.2)

## 2022-08-19 LAB — RAD ONC ARIA SESSION SUMMARY
Course Elapsed Days: 35
Plan Fractions Treated to Date: 25
Plan Prescribed Dose Per Fraction: 2 Gy
Plan Total Fractions Prescribed: 30
Plan Total Prescribed Dose: 60 Gy
Reference Point Dosage Given to Date: 50 Gy
Reference Point Session Dosage Given: 2 Gy
Session Number: 25

## 2022-08-19 LAB — CMP (CANCER CENTER ONLY)
ALT: 20 U/L (ref 0–44)
AST: 20 U/L (ref 15–41)
Albumin: 3 g/dL — ABNORMAL LOW (ref 3.5–5.0)
Alkaline Phosphatase: 67 U/L (ref 38–126)
Anion gap: 5 (ref 5–15)
BUN: 5 mg/dL — ABNORMAL LOW (ref 8–23)
CO2: 28 mmol/L (ref 22–32)
Calcium: 8.6 mg/dL — ABNORMAL LOW (ref 8.9–10.3)
Chloride: 104 mmol/L (ref 98–111)
Creatinine: 0.75 mg/dL (ref 0.61–1.24)
GFR, Estimated: >60 mL/min (ref >60)
Glucose, Bld: 91 mg/dL (ref 70–99)
Potassium: 4.2 mmol/L (ref 3.5–5.1)
Sodium: 137 mmol/L (ref 135–145)
Total Bilirubin: 0.5 mg/dL (ref 0.3–1.2)
Total Protein: 6.4 g/dL — ABNORMAL LOW (ref 6.5–8.1)

## 2022-08-19 MED ORDER — HEPARIN SOD (PORK) LOCK FLUSH 100 UNIT/ML IV SOLN
500.0000 [IU] | Freq: Once | INTRAVENOUS | Status: AC
  Administered 2022-08-19: 500 [IU]

## 2022-08-19 MED ORDER — SODIUM CHLORIDE 0.9 % IV SOLN
510.0000 mg | Freq: Once | INTRAVENOUS | Status: AC
  Administered 2022-08-19: 510 mg via INTRAVENOUS
  Filled 2022-08-19: qty 510

## 2022-08-19 MED ORDER — SODIUM CHLORIDE 0.9% FLUSH
10.0000 mL | Freq: Once | INTRAVENOUS | Status: AC
  Administered 2022-08-19: 10 mL

## 2022-08-19 MED ORDER — SODIUM CHLORIDE 0.9 % IV SOLN: Freq: Once | INTRAVENOUS | Status: AC

## 2022-08-19 MED ORDER — METOPROLOL SUCCINATE ER 50 MG PO TB24: 50.0000 mg | ORAL_TABLET | Freq: Two times a day (BID) | ORAL | 3 refills | Status: DC

## 2022-08-19 NOTE — Assessment & Plan Note (Signed)
This is worse since we resumed chemotherapy recently He does not need transfusion support We will defer his treatment until next week He will receive IV iron today

## 2022-08-19 NOTE — Progress Notes (Unsigned)
Nutrition  RD planning to see patient during infusion today but infusion cancelled.  Will send message to scheduling to reschedule nutrition appointment  Zaion Hreha B. Freida Busman, RD, LDN Registered Dietitian [phone]

## 2022-08-19 NOTE — Patient Instructions (Signed)

## 2022-08-19 NOTE — Assessment & Plan Note (Signed)
He has recent profound weight loss, low protein status and poor oral intake We discussed importance of additional nutritional supplement as tolerated We will get him to follow-up with dietitian today As above, we will cancel his treatment and focus on supportive care only

## 2022-08-19 NOTE — Progress Notes (Signed)
[facility] OFFICE PROGRESS NOTE  Patient Care Team: [name], [name], [name], [name]  ASSESSMENT & PLAN:  Non-small cell lung cancer [facility]) He is very frail He has lost 5 pounds due to poor appetite and has developed significant pancytopenia His performance status is very poor I do not recommend chemotherapy today and will defer to next week He will continue radiation therapy  Pancytopenia, acquired ([name]) This is worse since we resumed chemotherapy recently He does not need transfusion support We will defer his treatment until next week He will receive IV iron today  WEIGHT LOSS He has recent profound weight loss, low protein status and poor oral intake We discussed importance of additional nutritional supplement as tolerated We will get him to follow-up with dietitian today As above, we will cancel his treatment and focus on supportive care only  Orders Placed This Encounter  Procedures   CBC with Differential (Cancer Center Only)    Standing Status:   Future    Standing Expiration Date:   08/26/2023   CMP (Cancer Center only)    Standing Status:   Future    Standing Expiration Date:   08/26/2023    All questions were answered. The patient knows to call the clinic with any problems, questions or concerns. The total time spent in the appointment was 40 minutes encounter with patients including review of chart and various tests results, discussions about plan of care and coordination of care plan   Artis Delay, MD 08/19/2022 10:08 AM  INTERVAL HISTORY: Please see below for problem oriented charting. he returns for treatment follow-up with his wife He denies nausea or constipation but has poor appetite No recent bleeding He is eating very little and is noted to have lost a lot of weight  REVIEW OF SYSTEMS:    Constitutional: Denies fevers, chills Eyes: Denies blurriness of vision Ears, nose, mouth, throat, and face: Denies mucositis or sore throat Respiratory: Denies cough, dyspnea or wheezes Cardiovascular: Denies palpitation, chest discomfort or lower extremity swelling Gastrointestinal:  Denies nausea, heartburn or change in bowel habits Skin: Denies abnormal skin rashes Lymphatics: Denies new lymphadenopathy or easy bruising Neurological:Denies numbness, tingling or new weaknesses Behavioral/Psych: Mood is stable, no new changes  All other systems were reviewed with the patient and are negative.  I have reviewed the past medical history, past surgical history, social history and family history with the patient and they are unchanged from previous note.  ALLERGIES:  is allergic to paclitaxel.  MEDICATIONS:  Current Outpatient Medications  Medication Sig Dispense Refill   furosemide (LASIX) 40 MG tablet TAKE 1/2 TABLET BY MOUTH EVERY DAY 45 tablet 3   acetaminophen (TYLENOL) 500 MG tablet Take 500-1,000 mg by mouth every 8 (eight) hours as needed (for pain).      amiodarone (PACERONE) 400 MG tablet Take 0.5 tablets (200 mg total) by mouth 2 (two) times daily. 28 tablet 0   atorvastatin (LIPITOR) 80 MG tablet TAKE 1 TABLET BY MOUTH DAILY AT 6 PM (Patient taking differently: Take 80 mg by mouth daily. TAKE 1 TABLET BY MOUTH  DAILY AT 6 PM) 90 tablet 3   lidocaine-prilocaine (EMLA) cream Apply to affected area once 30 g 3   melatonin 5 MG TABS Take 1 tablet (5 mg total) by mouth at bedtime. 30 tablet 0   metoprolol succinate (TOPROL-XL) 50  MG 24 hr tablet Take 1 tablet (50 mg total) by mouth 2 (two) times daily. Take with or immediately following a meal. 60 tablet 3   mometasone-formoterol (DULERA) 100-5 MCG/ACT AERO Inhale 2 puffs into the lungs in the morning and at bedtime. 8.8 each 1   Multiple Vitamin (MULTIVITAMIN) capsule Take 1 capsule by mouth daily.     ondansetron (ZOFRAN) 8 MG  tablet Take 1 tablet (8 mg total) by mouth every 8 (eight) hours as needed for nausea. 30 tablet 3   pantoprazole (PROTONIX) 40 MG tablet Take 40 mg by mouth daily.     prochlorperazine (COMPAZINE) 10 MG tablet Take 1 tablet (10 mg total) by mouth every 6 (six) hours as needed for nausea or vomiting. 30 tablet 1   rivaroxaban (XARELTO) 20 MG TABS tablet Take 1 tablet (20 mg total) by mouth daily with supper. 90 tablet 1   sacubitril-valsartan (ENTRESTO) 24-26 MG Take 1 tablet by mouth 2 (two) times daily. 180 tablet 3   senna-docusate (SENOKOT-S) 8.6-50 MG tablet Take 2 tablets by mouth daily. 60 tablet 1   spironolactone (ALDACTONE) 25 MG tablet Take 1 tablet (25 mg total) by mouth daily. 30 tablet 3   No current facility-administered medications for this visit.   Facility-Administered Medications Ordered in Other Visits  Medication Dose Route Frequency Provider Last Rate Last Admin   0.9 %  sodium chloride infusion   Intravenous Once Bertis Ruddy, Endiya Klahr, MD       ferumoxytol (FERAHEME) 510 mg in sodium chloride 0.9 % 100 mL IVPB  510 mg Intravenous Once Bertis Ruddy, Kipp Shank, MD       heparin lock flush 100 unit/mL  500 Units Intracatheter Once Taje Littler, MD       sodium chloride flush (NS) 0.9 % injection 10 mL  10 mL Intracatheter Once Artis Delay, MD        SUMMARY OF ONCOLOGIC HISTORY: Oncology History Overview Note  Foundation One test showed no actionable mutations. 6TMB, MSI low   Non-small cell lung cancer ([name])  06/11/2022 Initial Diagnosis   Non-small cell lung cancer ([name])   06/20/2022 Pathology Results   CYTOLOGY - NON PAP  CASE: [id number]  PATIENT: Benjamin Cook  Non-Gynecological Cytology Report   Clinical History: Right hilar mass  Specimen Submitted:  A. LUNG, RIGHT HILAR MASS, FINE NEEDLE ASPIRATION:    FINAL MICROSCOPIC DIAGNOSIS:  - Malignant cells consistent with non-small cell carcinoma  - See comment   SPECIMEN ADEQUACY:  Satisfactory for evaluation   DIAGNOSTIC  COMMENTS:  The specimen contains atypical cells with malignant appearance characterized by nuclear enlargement, pleomorphism, clumped chromatin and occasional nucleoli. Immunohistochemical stains were attempted however no cells were present on the stains. The morphology does not support a small cell carcinoma with differential diagnosis including adenocarcinoma, large cell neuroendocrine carcinoma, or large cell carcinoma, et al. More diagnostic material is needed for further classification and molecular tests.    07/02/2022 PET scan   1. Right suprahilar hypermetabolic mass, consistent with primary lung malignancy. 2. Subcarinal hypermetabolic activity which is contiguous with right hilar mass, possibly due to mediastinal extension or subcarinal lymphadenopathy. 3. No evidence of metastatic disease in the abdomen or pelvis. 4. Right adrenal gland mass with low-level FDG uptake, unchanged when compared with prior exams dating back to 2018 and consistent with benign adenoma. 5. Hypermetabolic right paraspinal consolidation, likely infectious or inflammatory. 6. Aortic Atherosclerosis (ICD10-I70.0) and Emphysema (ICD10-J43.9).     07/02/2022 Cancer Staging   Staging form: Lung,  AJCC 8th Edition - Clinical stage from 07/02/2022: Stage IIIA (cT2, cN2, cM0) - Signed by Artis Delay, MD on 07/02/2022 Stage prefix: Initial diagnosis   07/09/2022 Procedure   Successful placement of a right internal jugular approach power injectable Port-A-Cath. The catheter is ready for immediate use.     07/15/2022 -  Chemotherapy   Patient is on Treatment Plan : LUNG Carboplatin + Paclitaxel + XRT q7d       PHYSICAL EXAMINATION: ECOG PERFORMANCE STATUS: 2 - Symptomatic, <50% confined to bed  Vitals:   08/19/22 0932  BP: 133/64  Pulse: 83  Resp: 20  Temp: 98.5 F (36.9 C)  SpO2: 98%   Filed Weights   08/19/22 0932  Weight: 129 lb 12.8 oz (58.9 kg)    GENERAL:alert, no distress and comfortable.  He looks  frail and pale NEURO: alert & oriented x 3 with fluent speech, no focal motor/sensory deficits  LABORATORY DATA:  I have reviewed the data as listed    Component Value Date/Time   NA 137 08/19/2022 0903   NA 138 04/15/2022 0913   K 4.2 08/19/2022 0903   CL 104 08/19/2022 0903   CO2 28 08/19/2022 0903   GLUCOSE 91 08/19/2022 0903   BUN 5 (L) 08/19/2022 0903   BUN 7 (L) 04/15/2022 0913   CREATININE 0.75 08/19/2022 0903   CREATININE 0.88 09/15/2015 0959   CALCIUM 8.6 (L) 08/19/2022 0903   PROT 6.4 (L) 08/19/2022 0903   PROT 6.8 11/07/2020 1303   ALBUMIN 3.0 (L) 08/19/2022 0903   ALBUMIN 3.6 (L) 11/07/2020 1303   AST 20 08/19/2022 0903   ALT 20 08/19/2022 0903   ALKPHOS 67 08/19/2022 0903   BILITOT 0.5 08/19/2022 0903   GFRNONAA >60 08/19/2022 0903   GFRAA 96 03/17/2020 1016    No results found for: "SPEP", "UPEP"  Lab Results  Component Value Date   WBC 5.4 08/19/2022   NEUTROABS 4.7 08/19/2022   HGB 8.4 (L) 08/19/2022   HCT 26.1 (L) 08/19/2022   MCV 82.9 08/19/2022   PLT 138 (L) 08/19/2022      Chemistry      Component Value Date/Time   NA 137 08/19/2022 0903   NA 138 04/15/2022 0913   K 4.2 08/19/2022 0903   CL 104 08/19/2022 0903   CO2 28 08/19/2022 0903   BUN 5 (L) 08/19/2022 0903   BUN 7 (L) 04/15/2022 0913   CREATININE 0.75 08/19/2022 0903   CREATININE 0.88 09/15/2015 0959      Component Value Date/Time   CALCIUM 8.6 (L) 08/19/2022 0903   ALKPHOS 67 08/19/2022 0903   AST 20 08/19/2022 0903   ALT 20 08/19/2022 0903   BILITOT 0.5 08/19/2022 2423

## 2022-08-19 NOTE — Assessment & Plan Note (Signed)
He is very frail He has lost 5 pounds due to poor appetite and has developed significant pancytopenia His performance status is very poor I do not recommend chemotherapy today and will defer to next week He will continue radiation therapy

## 2022-08-20 ENCOUNTER — Ambulatory Visit
Admission: RE | Admit: 2022-08-20 | Discharge: 2022-08-20 | Disposition: A | Discharge status: Home / Self Care | Payer: Medicare HMO | Source: Ambulatory Visit | Attending: Radiation Oncology | Admitting: Radiation Oncology

## 2022-08-20 ENCOUNTER — Telehealth: Payer: Self-pay | Admitting: Nutrition

## 2022-08-20 ENCOUNTER — Other Ambulatory Visit: Payer: Self-pay

## 2022-08-20 DIAGNOSIS — Z510 Encounter for antineoplastic radiation therapy: Secondary | ICD-10-CM | POA: Diagnosis not present

## 2022-08-20 LAB — RAD ONC ARIA SESSION SUMMARY
Course Elapsed Days: 36
Plan Fractions Treated to Date: 26
Plan Prescribed Dose Per Fraction: 2 Gy
Plan Total Fractions Prescribed: 30
Plan Total Prescribed Dose: 60 Gy
Reference Point Dosage Given to Date: 52 Gy
Reference Point Session Dosage Given: 2 Gy
Session Number: 26

## 2022-08-20 NOTE — Telephone Encounter (Signed)
Patient is aware of scheduled Nutrient appointment times/date

## 2022-08-21 ENCOUNTER — Ambulatory Visit
Admission: RE | Admit: 2022-08-21 | Discharge: 2022-08-21 | Disposition: A | Discharge status: Home / Self Care | Payer: Medicare HMO | Source: Ambulatory Visit | Attending: Radiation Oncology | Admitting: Radiation Oncology

## 2022-08-21 ENCOUNTER — Other Ambulatory Visit: Payer: Self-pay

## 2022-08-21 DIAGNOSIS — Z510 Encounter for antineoplastic radiation therapy: Secondary | ICD-10-CM | POA: Diagnosis not present

## 2022-08-21 LAB — RAD ONC ARIA SESSION SUMMARY
Course Elapsed Days: 37
Plan Fractions Treated to Date: 27
Plan Prescribed Dose Per Fraction: 2 Gy
Plan Total Fractions Prescribed: 30
Plan Total Prescribed Dose: 60 Gy
Reference Point Dosage Given to Date: 54 Gy
Reference Point Session Dosage Given: 2 Gy
Session Number: 27

## 2022-08-22 ENCOUNTER — Ambulatory Visit
Admission: RE | Admit: 2022-08-22 | Discharge: 2022-08-22 | Disposition: A | Discharge status: Home / Self Care | Payer: Medicare HMO | Source: Ambulatory Visit | Attending: Radiation Oncology | Admitting: Radiation Oncology

## 2022-08-22 ENCOUNTER — Other Ambulatory Visit: Payer: Self-pay

## 2022-08-22 DIAGNOSIS — Z510 Encounter for antineoplastic radiation therapy: Secondary | ICD-10-CM | POA: Diagnosis not present

## 2022-08-22 LAB — RAD ONC ARIA SESSION SUMMARY
Course Elapsed Days: 38
Plan Fractions Treated to Date: 28
Plan Prescribed Dose Per Fraction: 2 Gy
Plan Total Fractions Prescribed: 30
Plan Total Prescribed Dose: 60 Gy
Reference Point Dosage Given to Date: 56 Gy
Reference Point Session Dosage Given: 2 Gy
Session Number: 28

## 2022-08-23 ENCOUNTER — Other Ambulatory Visit: Payer: Self-pay

## 2022-08-23 ENCOUNTER — Ambulatory Visit
Admission: RE | Admit: 2022-08-23 | Discharge: 2022-08-23 | Disposition: A | Discharge status: Home / Self Care | Payer: Medicare HMO | Source: Ambulatory Visit | Attending: Radiation Oncology | Admitting: Radiation Oncology

## 2022-08-23 DIAGNOSIS — Z510 Encounter for antineoplastic radiation therapy: Secondary | ICD-10-CM | POA: Diagnosis not present

## 2022-08-23 LAB — RAD ONC ARIA SESSION SUMMARY
Course Elapsed Days: 39
Plan Fractions Treated to Date: 29
Plan Prescribed Dose Per Fraction: 2 Gy
Plan Total Fractions Prescribed: 30
Plan Total Prescribed Dose: 60 Gy
Reference Point Dosage Given to Date: 58 Gy
Reference Point Session Dosage Given: 2 Gy
Session Number: 29

## 2022-08-23 MED FILL — Dexamethasone Sodium Phosphate Inj 100 MG/10ML: INTRAMUSCULAR | Qty: 1 | Status: AC

## 2022-08-26 ENCOUNTER — Other Ambulatory Visit: Payer: Self-pay

## 2022-08-26 ENCOUNTER — Ambulatory Visit
Admission: RE | Admit: 2022-08-26 | Discharge: 2022-08-26 | Disposition: A | Discharge status: Home / Self Care | Payer: Medicare HMO | Source: Ambulatory Visit | Attending: Radiation Oncology | Admitting: Radiation Oncology

## 2022-08-26 ENCOUNTER — Encounter: Payer: Self-pay | Admitting: Hematology and Oncology

## 2022-08-26 ENCOUNTER — Inpatient Hospital Stay: Payer: Medicare HMO

## 2022-08-26 ENCOUNTER — Inpatient Hospital Stay (HOSPITAL_BASED_OUTPATIENT_CLINIC_OR_DEPARTMENT_OTHER): Payer: Medicare HMO | Admitting: Hematology and Oncology

## 2022-08-26 VITALS — BP 153/69 | HR 63 | Temp 98.1°F | Resp 18 | Ht 70.0 in | Wt 128.4 lb

## 2022-08-26 DIAGNOSIS — Z5111 Encounter for antineoplastic chemotherapy: Secondary | ICD-10-CM | POA: Diagnosis not present

## 2022-08-26 DIAGNOSIS — K5909 Other constipation: Secondary | ICD-10-CM | POA: Diagnosis not present

## 2022-08-26 DIAGNOSIS — Z510 Encounter for antineoplastic radiation therapy: Secondary | ICD-10-CM | POA: Diagnosis not present

## 2022-08-26 DIAGNOSIS — C3491 Malignant neoplasm of unspecified part of right bronchus or lung: Secondary | ICD-10-CM | POA: Diagnosis not present

## 2022-08-26 DIAGNOSIS — D61818 Other pancytopenia: Secondary | ICD-10-CM

## 2022-08-26 DIAGNOSIS — R531 Weakness: Secondary | ICD-10-CM | POA: Diagnosis not present

## 2022-08-26 DIAGNOSIS — E440 Moderate protein-calorie malnutrition: Secondary | ICD-10-CM

## 2022-08-26 LAB — CBC WITH DIFFERENTIAL (CANCER CENTER ONLY)
Abs Immature Granulocytes: 0.02 10*3/uL (ref 0.00–0.07)
Basophils Absolute: 0 10*3/uL (ref 0.0–0.1)
Basophils Relative: 1 %
Eosinophils Absolute: 0.1 10*3/uL (ref 0.0–0.5)
Eosinophils Relative: 4 %
HCT: 29 % — ABNORMAL LOW (ref 39.0–52.0)
Hemoglobin: 9.3 g/dL — ABNORMAL LOW (ref 13.0–17.0)
Immature Granulocytes: 1 %
Lymphocytes Relative: 8 %
Lymphs Abs: 0.3 10*3/uL — ABNORMAL LOW (ref 0.7–4.0)
MCH: 27.8 pg (ref 26.0–34.0)
MCHC: 32.1 g/dL (ref 30.0–36.0)
MCV: 86.8 fL (ref 80.0–100.0)
Monocytes Absolute: 1 10*3/uL (ref 0.1–1.0)
Monocytes Relative: 27 %
Neutro Abs: 2.2 10*3/uL (ref 1.7–7.7)
Neutrophils Relative %: 59 %
Platelet Count: 111 10*3/uL — ABNORMAL LOW (ref 150–400)
RBC: 3.34 MIL/uL — ABNORMAL LOW (ref 4.22–5.81)
RDW: 27.2 % — ABNORMAL HIGH (ref 11.5–15.5)
WBC Count: 3.6 10*3/uL — ABNORMAL LOW (ref 4.0–10.5)
nRBC: 0 % (ref 0.0–0.2)

## 2022-08-26 LAB — CMP (CANCER CENTER ONLY)
ALT: 17 U/L (ref 0–44)
AST: 21 U/L (ref 15–41)
Albumin: 3 g/dL — ABNORMAL LOW (ref 3.5–5.0)
Alkaline Phosphatase: 69 U/L (ref 38–126)
Anion gap: 5 (ref 5–15)
BUN: 9 mg/dL (ref 8–23)
CO2: 27 mmol/L (ref 22–32)
Calcium: 8.7 mg/dL — ABNORMAL LOW (ref 8.9–10.3)
Chloride: 105 mmol/L (ref 98–111)
Creatinine: 0.76 mg/dL (ref 0.61–1.24)
GFR, Estimated: >60 mL/min (ref >60)
Glucose, Bld: 92 mg/dL (ref 70–99)
Potassium: 4 mmol/L (ref 3.5–5.1)
Sodium: 137 mmol/L (ref 135–145)
Total Bilirubin: 0.4 mg/dL (ref 0.3–1.2)
Total Protein: 6.2 g/dL — ABNORMAL LOW (ref 6.5–8.1)

## 2022-08-26 LAB — RAD ONC ARIA SESSION SUMMARY
Course Elapsed Days: 42
Plan Fractions Treated to Date: 30
Plan Prescribed Dose Per Fraction: 2 Gy
Plan Total Fractions Prescribed: 30
Plan Total Prescribed Dose: 60 Gy
Reference Point Dosage Given to Date: 60 Gy
Reference Point Session Dosage Given: 2 Gy
Session Number: 30

## 2022-08-26 MED ORDER — SODIUM CHLORIDE 0.9% FLUSH
10.0000 mL | Freq: Once | INTRAVENOUS | Status: AC | PRN
  Administered 2022-08-26: 10 mL

## 2022-08-26 MED ORDER — SODIUM CHLORIDE 0.9 % IV SOLN: Freq: Once | INTRAVENOUS | Status: AC

## 2022-08-26 NOTE — Assessment & Plan Note (Signed)
He has chronic constipation We discussed importance of aggressive laxatives

## 2022-08-26 NOTE — Progress Notes (Signed)
DISCONTINUE ON PATHWAY REGIMEN - Non-Small Cell Lung     A cycle is every 7 days, concurrent with RT:     Paclitaxel      Carboplatin   **Always confirm dose/schedule in your pharmacy ordering system**  REASON: Continuation Of Treatment PRIOR TREATMENT: [id number]: Carboplatin AUC=2 + Paclitaxel 45 mg/m2 Weekly During Radiation TREATMENT RESPONSE: Unable to Evaluate  START ON PATHWAY REGIMEN - Non-Small Cell Lung     A cycle is every 28 days:     Durvalumab   **Always confirm dose/schedule in your pharmacy ordering system**  Patient Characteristics: Preoperative or Nonsurgical Candidate (Clinical Staging), Stage III - Nonsurgical Candidate (Nonsquamous and Squamous), PS = 0, 1 Therapeutic Status: Preoperative or Nonsurgical Candidate (Clinical Staging) AJCC T Category: cT2 AJCC N Category: cN2 AJCC M Category: cM0 AJCC 8 Stage Grouping: IIIA ECOG Performance Status: 1 Intent of Therapy: Curative Intent, Discussed with Patient

## 2022-08-26 NOTE — Patient Instructions (Signed)

## 2022-08-26 NOTE — Assessment & Plan Note (Signed)
He is very frail and weak Despite withholding treatment, he continues to lose weight He is also developing more pancytopenia I will omit his chemotherapy today He will complete his radiation treatment this week I plan to repeat PET/CT imaging next month before switching him over to immunotherapy

## 2022-08-26 NOTE — Progress Notes (Signed)
[facility] OFFICE PROGRESS NOTE  Patient Care Team: [name], [name], [name], [name]  ASSESSMENT & PLAN:  Non-small cell lung cancer [facility]) He is very frail and weak Despite withholding treatment, he continues to lose weight He is also developing more pancytopenia I will omit his chemotherapy today He will complete his radiation treatment this week I plan to repeat PET/CT imaging next month before switching him over to immunotherapy  Pancytopenia, acquired ([name]) This is due to recent treatment He is not symptomatic Observe closely  Other constipation He has chronic constipation We discussed importance of aggressive laxatives  Generalized weakness He has poor oral intake and generalized weakness I recommend IV fluid support today  Malnutrition of moderate degree He has protein calorie malnutrition and recent weight loss He will see dietitian today  Orders Placed This Encounter  Procedures   NM PET Image Restage (PS) Skull Base to Thigh (F-18 FDG)    Standing Status:   Future    Standing Expiration Date:   08/26/2023    Order Specific Question:   If indicated for the ordered procedure, I authorize the administration of a radiopharmaceutical per Radiology protocol    Answer:   Yes    Order Specific Question:   Preferred imaging location?    Answer:   [facility]    Order Specific Question:   Radiology Contrast Protocol - do NOT remove file path    Answer:   [email or web address]   CBC with Differential (Cancer Center Only)    Standing Status:   Future    Standing Expiration Date:   10/01/2023   CMP (Cancer Center only)    Standing Status:   Future    Standing Expiration Date:   10/01/2023   T4    Standing Status:   Future    Standing Expiration Date:   10/01/2023   TSH     Standing Status:   Future    Standing Expiration Date:   10/01/2023   CBC with Differential (Cancer Center Only)    Standing Status:   Future    Standing Expiration Date:   10/29/2023   CMP (Cancer Center only)    Standing Status:   Future    Standing Expiration Date:   10/29/2023   CBC with Differential (Cancer Center Only)    Standing Status:   Future    Standing Expiration Date:   11/26/2023   CMP (Cancer Center only)    Standing Status:   Future    Standing Expiration Date:   11/26/2023   T4    Standing Status:   Future    Standing Expiration Date:   11/26/2023   TSH    Standing Status:   Future    Standing Expiration Date:   11/26/2023   CBC with Differential (Cancer Center Only)    Standing Status:   Future    Standing Expiration Date:   12/24/2023   CMP (Cancer Center only)    Standing Status:   Future    Standing Expiration Date:   12/24/2023   CBC with Differential (Cancer Center Only)    Standing Status:   Future    Standing Expiration Date:   01/21/2024   CMP (Cancer Center only)    Standing Status:   Future    Standing Expiration Date:   01/21/2024   CBC with  Differential (Cancer Center Only)    Standing Status:   Future    Standing Expiration Date:   02/18/2024   CMP (Cancer Center only)    Standing Status:   Future    Standing Expiration Date:   02/18/2024   T4    Standing Status:   Future    Standing Expiration Date:   02/18/2024   TSH    Standing Status:   Future    Standing Expiration Date:   02/18/2024    All questions were answered. The patient knows to call the clinic with any problems, questions or concerns. The total time spent in the appointment was 40 minutes encounter with patients including review of chart and various tests results, discussions about plan of care and coordination of care plan   Artis Delay, MD 08/26/2022 12:03 PM  INTERVAL HISTORY: Please see below for problem oriented charting. he returns for treatment follow-up with his  wife He has lost some weight despite his best attempt to eat He denies recent fever or chills He continues to feel weak We discussed test results and plan of care for repeat imaging study and transitioning treatment to immunotherapy  REVIEW OF SYSTEMS:   Constitutional: Denies fevers, chills Eyes: Denies blurriness of vision Ears, nose, mouth, throat, and face: Denies mucositis or sore throat Respiratory: Denies cough, dyspnea or wheezes Cardiovascular: Denies palpitation, chest discomfort or lower extremity swelling Skin: Denies abnormal skin rashes Lymphatics: Denies new lymphadenopathy or easy bruising Behavioral/Psych: Mood is stable, no new changes  All other systems were reviewed with the patient and are negative.  I have reviewed the past medical history, past surgical history, social history and family history with the patient and they are unchanged from previous note.  ALLERGIES:  is allergic to paclitaxel.  MEDICATIONS:  Current Outpatient Medications  Medication Sig Dispense Refill   furosemide (LASIX) 40 MG tablet TAKE 1/2 TABLET BY MOUTH EVERY DAY 45 tablet 3   acetaminophen (TYLENOL) 500 MG tablet Take 500-1,000 mg by mouth every 8 (eight) hours as needed (for pain).      amiodarone (PACERONE) 400 MG tablet Take 0.5 tablets (200 mg total) by mouth 2 (two) times daily. 28 tablet 0   atorvastatin (LIPITOR) 80 MG tablet TAKE 1 TABLET BY MOUTH DAILY AT 6 PM (Patient taking differently: Take 80 mg by mouth daily. TAKE 1 TABLET BY MOUTH  DAILY AT 6 PM) 90 tablet 3   melatonin 5 MG TABS Take 1 tablet (5 mg total) by mouth at bedtime. 30 tablet 0   metoprolol succinate (TOPROL-XL) 50 MG 24 hr tablet Take 1 tablet (50 mg total) by mouth 2 (two) times daily. Take with or immediately following a meal. 60 tablet 3   mometasone-formoterol (DULERA) 100-5 MCG/ACT AERO Inhale 2 puffs into the lungs in the morning and at bedtime. 8.8 each 1   Multiple Vitamin (MULTIVITAMIN) capsule Take 1  capsule by mouth daily.     ondansetron (ZOFRAN) 8 MG tablet Take 1 tablet (8 mg total) by mouth every 8 (eight) hours as needed for nausea. 30 tablet 3   pantoprazole (PROTONIX) 40 MG tablet Take 40 mg by mouth daily.     rivaroxaban (XARELTO) 20 MG TABS tablet Take 1 tablet (20 mg total) by mouth daily with supper. 90 tablet 1   sacubitril-valsartan (ENTRESTO) 24-26 MG Take 1 tablet by mouth 2 (two) times daily. 180 tablet 3   senna-docusate (SENOKOT-S) 8.6-50 MG tablet Take 2 tablets by mouth daily. 60  tablet 1   spironolactone (ALDACTONE) 25 MG tablet Take 1 tablet (25 mg total) by mouth daily. 30 tablet 3   No current facility-administered medications for this visit.    SUMMARY OF ONCOLOGIC HISTORY: Oncology History Overview Note  Foundation One test showed no actionable mutations. 6TMB, MSI low   Non-small cell lung cancer ([name])  06/11/2022 Initial Diagnosis   Non-small cell lung cancer ([name])   06/20/2022 Pathology Results   CYTOLOGY - NON PAP  CASE: [id number]  PATIENT: Benjamin Cook  Non-Gynecological Cytology Report   Clinical History: Right hilar mass  Specimen Submitted:  A. LUNG, RIGHT HILAR MASS, FINE NEEDLE ASPIRATION:    FINAL MICROSCOPIC DIAGNOSIS:  - Malignant cells consistent with non-small cell carcinoma  - See comment   SPECIMEN ADEQUACY:  Satisfactory for evaluation   DIAGNOSTIC COMMENTS:  The specimen contains atypical cells with malignant appearance characterized by nuclear enlargement, pleomorphism, clumped chromatin and occasional nucleoli. Immunohistochemical stains were attempted however no cells were present on the stains. The morphology does not support a small cell carcinoma with differential diagnosis including adenocarcinoma, large cell neuroendocrine carcinoma, or large cell carcinoma, et al. More diagnostic material is needed for further classification and molecular tests.    07/02/2022 PET scan   1. Right suprahilar hypermetabolic mass,  consistent with primary lung malignancy. 2. Subcarinal hypermetabolic activity which is contiguous with right hilar mass, possibly due to mediastinal extension or subcarinal lymphadenopathy. 3. No evidence of metastatic disease in the abdomen or pelvis. 4. Right adrenal gland mass with low-level FDG uptake, unchanged when compared with prior exams dating back to 2018 and consistent with benign adenoma. 5. Hypermetabolic right paraspinal consolidation, likely infectious or inflammatory. 6. Aortic Atherosclerosis (ICD10-I70.0) and Emphysema (ICD10-J43.9).     07/02/2022 Cancer Staging   Staging form: Lung, AJCC 8th Edition - Clinical stage from 07/02/2022: Stage IIIA (cT2, cN2, cM0) - Signed by Artis Delay, MD on 07/02/2022 Stage prefix: Initial diagnosis   07/09/2022 Procedure   Successful placement of a right internal jugular approach power injectable Port-A-Cath. The catheter is ready for immediate use.     07/15/2022 - 08/12/2022 Chemotherapy   Patient is on Treatment Plan : LUNG Carboplatin + Paclitaxel + XRT q7d     10/01/2022 -  Chemotherapy   Patient is on Treatment Plan : LUNG NSCLC Durvalumab (1500) q28d       PHYSICAL EXAMINATION: ECOG PERFORMANCE STATUS: 2 - Symptomatic, <50% confined to bed  Vitals:   08/26/22 1100  BP: (!) 153/69  Pulse: 63  Resp: 18  Temp: 98.1 F (36.7 C)  SpO2: 100%   Filed Weights   08/26/22 1100  Weight: 128 lb 6.4 oz (58.2 kg)    GENERAL:alert, no distress and comfortable.  He looks thin and frail NEURO: alert & oriented x 3 with fluent speech, no focal motor/sensory deficits  LABORATORY DATA:  I have reviewed the data as listed    Component Value Date/Time   NA 137 08/26/2022 1001   NA 138 04/15/2022 0913   K 4.0 08/26/2022 1001   CL 105 08/26/2022 1001   CO2 27 08/26/2022 1001   GLUCOSE 92 08/26/2022 1001   BUN 9 08/26/2022 1001   BUN 7 (L) 04/15/2022 0913   CREATININE 0.76 08/26/2022 1001   CREATININE 0.88 09/15/2015 0959    CALCIUM 8.7 (L) 08/26/2022 1001   PROT 6.2 (L) 08/26/2022 1001   PROT 6.8 11/07/2020 1303   ALBUMIN 3.0 (L) 08/26/2022 1001   ALBUMIN 3.6 (L) 11/07/2020  1303   AST 21 08/26/2022 1001   ALT 17 08/26/2022 1001   ALKPHOS 69 08/26/2022 1001   BILITOT 0.4 08/26/2022 1001   GFRNONAA >60 08/26/2022 1001   GFRAA 96 03/17/2020 1016    No results found for: "SPEP", "UPEP"  Lab Results  Component Value Date   WBC 3.6 (L) 08/26/2022   NEUTROABS 2.2 08/26/2022   HGB 9.3 (L) 08/26/2022   HCT 29.0 (L) 08/26/2022   MCV 86.8 08/26/2022   PLT 111 (L) 08/26/2022      Chemistry      Component Value Date/Time   NA 137 08/26/2022 1001   NA 138 04/15/2022 0913   K 4.0 08/26/2022 1001   CL 105 08/26/2022 1001   CO2 27 08/26/2022 1001   BUN 9 08/26/2022 1001   BUN 7 (L) 04/15/2022 0913   CREATININE 0.76 08/26/2022 1001   CREATININE 0.88 09/15/2015 0959      Component Value Date/Time   CALCIUM 8.7 (L) 08/26/2022 1001   ALKPHOS 69 08/26/2022 1001   AST 21 08/26/2022 1001   ALT 17 08/26/2022 1001   BILITOT 0.4 08/26/2022 1001

## 2022-08-26 NOTE — Assessment & Plan Note (Signed)
He has protein calorie malnutrition and recent weight loss He will see dietitian today

## 2022-08-26 NOTE — Assessment & Plan Note (Signed)
He has poor oral intake and generalized weakness I recommend IV fluid support today

## 2022-08-26 NOTE — Assessment & Plan Note (Signed)
This is due to recent treatment He is not symptomatic Observe closely

## 2022-08-27 ENCOUNTER — Ambulatory Visit: Admission: RE | Admit: 2022-08-27 | Payer: Medicare HMO | Source: Ambulatory Visit

## 2022-08-27 ENCOUNTER — Other Ambulatory Visit: Payer: Self-pay

## 2022-08-27 DIAGNOSIS — Z510 Encounter for antineoplastic radiation therapy: Secondary | ICD-10-CM | POA: Diagnosis not present

## 2022-08-27 LAB — RAD ONC ARIA SESSION SUMMARY
Course Elapsed Days: 43
Plan Fractions Treated to Date: 1
Plan Prescribed Dose Per Fraction: 2 Gy
Plan Total Fractions Prescribed: 3
Plan Total Prescribed Dose: 6 Gy
Reference Point Dosage Given to Date: 2 Gy
Reference Point Session Dosage Given: 2 Gy
Session Number: 31

## 2022-08-28 ENCOUNTER — Ambulatory Visit: Payer: Medicare HMO

## 2022-08-28 ENCOUNTER — Ambulatory Visit: Admission: RE | Admit: 2022-08-28 | Payer: Medicare HMO | Source: Ambulatory Visit

## 2022-08-28 ENCOUNTER — Other Ambulatory Visit: Payer: Self-pay

## 2022-08-28 DIAGNOSIS — Z510 Encounter for antineoplastic radiation therapy: Secondary | ICD-10-CM | POA: Diagnosis not present

## 2022-08-28 LAB — RAD ONC ARIA SESSION SUMMARY
Course Elapsed Days: 44
Plan Fractions Treated to Date: 2
Plan Prescribed Dose Per Fraction: 2 Gy
Plan Total Fractions Prescribed: 3
Plan Total Prescribed Dose: 6 Gy
Reference Point Dosage Given to Date: 4 Gy
Reference Point Session Dosage Given: 2 Gy
Session Number: 32

## 2022-08-29 ENCOUNTER — Ambulatory Visit
Admission: RE | Admit: 2022-08-29 | Discharge: 2022-08-29 | Disposition: A | Discharge status: Home / Self Care | Payer: Medicare HMO | Source: Ambulatory Visit | Attending: Radiation Oncology | Admitting: Radiation Oncology

## 2022-08-29 ENCOUNTER — Other Ambulatory Visit: Payer: Self-pay

## 2022-08-29 ENCOUNTER — Ambulatory Visit: Payer: Medicare HMO

## 2022-08-29 DIAGNOSIS — Z510 Encounter for antineoplastic radiation therapy: Secondary | ICD-10-CM | POA: Diagnosis not present

## 2022-08-29 LAB — RAD ONC ARIA SESSION SUMMARY
Course Elapsed Days: 45
Plan Fractions Treated to Date: 3
Plan Prescribed Dose Per Fraction: 2 Gy
Plan Total Fractions Prescribed: 3
Plan Total Prescribed Dose: 6 Gy
Reference Point Dosage Given to Date: 6 Gy
Reference Point Session Dosage Given: 2 Gy
Session Number: 33

## 2022-08-30 ENCOUNTER — Ambulatory Visit: Payer: Medicare HMO

## 2022-08-31 NOTE — Radiation Completion Notes (Signed)
  Radiation Oncology         [phone] ________________________________  Name: Benjamin Cook MRN: [id number]  Date of Service: 08/29/2022  DOB: October 17, 1945  End of Treatment Note   Diagnosis: Stage IIIC, cT2N2M0, NSCLC, involving a suprahilar mass in the right lung   Intent: Curative     ==========DELIVERED PLANS==========  First Treatment Date: 2022-07-15 - Last Treatment Date: 2022-08-29   Plan Name: Lung_R Site: Lung, Right Technique: 3D Mode: Photon Dose Per Fraction: 2 Gy Prescribed Dose (Delivered / Prescribed): 60 Gy / 60 Gy Prescribed Fxs (Delivered / Prescribed): 30 / 30   Plan Name: Lung_R_Bst Site: Lung, Right Technique: 3D Mode: Photon Dose Per Fraction: 2 Gy Prescribed Dose (Delivered / Prescribed): 6 Gy / 6 Gy Prescribed Fxs (Delivered / Prescribed): 3 / 3     ==========ON TREATMENT VISIT DATES========== 2022-07-19, 2022-07-26, 2022-08-02, 2022-08-09, 2022-08-16, 2022-08-23, 2022-08-29    See weekly On Treatment Notes in Epic for details. The patient tolerated radiation. He developed fatigue and anticipated skin changes in the treatment field.   The patient will receive a call in about one month from the radiation oncology department. He will continue follow up with Dr. Bertis Ruddy as well.      Osker Mason, PAC

## 2022-09-09 ENCOUNTER — Other Ambulatory Visit: Payer: Self-pay

## 2022-09-09 ENCOUNTER — Inpatient Hospital Stay: Payer: Medicare HMO | Attending: Hematology and Oncology | Admitting: Nutrition

## 2022-09-09 DIAGNOSIS — Z5112 Encounter for antineoplastic immunotherapy: Secondary | ICD-10-CM | POA: Insufficient documentation

## 2022-09-09 DIAGNOSIS — C3490 Malignant neoplasm of unspecified part of unspecified bronchus or lung: Secondary | ICD-10-CM | POA: Insufficient documentation

## 2022-09-09 DIAGNOSIS — E440 Moderate protein-calorie malnutrition: Secondary | ICD-10-CM | POA: Insufficient documentation

## 2022-09-09 DIAGNOSIS — Z7901 Long term (current) use of anticoagulants: Secondary | ICD-10-CM | POA: Insufficient documentation

## 2022-09-09 DIAGNOSIS — I5022 Chronic systolic (congestive) heart failure: Secondary | ICD-10-CM | POA: Insufficient documentation

## 2022-09-09 DIAGNOSIS — Z79899 Other long term (current) drug therapy: Secondary | ICD-10-CM | POA: Insufficient documentation

## 2022-09-09 DIAGNOSIS — D61818 Other pancytopenia: Secondary | ICD-10-CM | POA: Insufficient documentation

## 2022-09-09 NOTE — Progress Notes (Signed)
Nutrition follow-up completed with patient and wife.  He is followed by Dr. Bertis Ruddy. Patient has completed radiation therapy for non-small cell lung cancer. Patient to begin Durvalumab every 28 days.  Weight decreased slightly to 127.6 pounds today from 128 pounds 6.4 ounces July 1.  Albumin documented is 3.0 on July 22.  Patient states he is slightly more hungry and has noticed small increase in his intake.  He still reports he is weak however he was able to stand to weigh today.  He still has issues with constipation and was taking Mira LAX every other day.  Reports he is finding foods that are beginning to taste good to him.  Denies abdominal pain.  He is drinking 1 oral nutrition supplement every other day.  He generally eats 2 meals a day and snacks on Doritos and fruit.  Nutrition diagnosis: Unintentional weight loss, ongoing.  Intervention: Recommended patient increase oral nutrition supplements to 1 carton daily.  Provided additional samples and coupons. Recommended patient add several snacks throughout the day and include protein with all meals and snacks. Provided a handout on improving poor appetite. Continue bowel regimen.  Consider Mira LAX daily and increase fluid intake. Increase activity safely per MD.  Monitoring, evaluation, goals: Patient will tolerate increased calories and protein to promote weight gain.  Next visit: Tuesday, August 27 during infusion.  **Disclaimer: This note was dictated with voice recognition software. Similar sounding words can inadvertently be transcribed and this note may contain transcription errors which may not have been corrected upon publication of note.**

## 2022-09-26 ENCOUNTER — Encounter (HOSPITAL_COMMUNITY)
Admission: RE | Admit: 2022-09-26 | Discharge: 2022-09-26 | Disposition: A | Discharge status: Home / Self Care | Payer: Medicare HMO | Source: Ambulatory Visit | Attending: Hematology and Oncology | Admitting: Hematology and Oncology

## 2022-09-26 DIAGNOSIS — C3491 Malignant neoplasm of unspecified part of right bronchus or lung: Secondary | ICD-10-CM | POA: Insufficient documentation

## 2022-09-26 LAB — GLUCOSE, CAPILLARY: Glucose-Capillary: 89 mg/dL (ref 70–99)

## 2022-09-26 MED ORDER — FLUDEOXYGLUCOSE F - 18 (FDG) INJECTION
6.3000 | Freq: Once | INTRAVENOUS | Status: AC
  Administered 2022-09-26: 6.3 via INTRAVENOUS

## 2022-09-27 ENCOUNTER — Ambulatory Visit (INDEPENDENT_AMBULATORY_CARE_PROVIDER_SITE_OTHER): Payer: Medicare HMO

## 2022-09-27 DIAGNOSIS — I5042 Chronic combined systolic (congestive) and diastolic (congestive) heart failure: Secondary | ICD-10-CM

## 2022-09-29 ENCOUNTER — Other Ambulatory Visit: Payer: Self-pay

## 2022-09-29 LAB — CUP PACEART REMOTE DEVICE CHECK
Battery Remaining Longevity: 10 mo
Battery Remaining Percentage: 11 %
Battery Voltage: 2.69 V
Brady Statistic AP VP Percent: 1 %
Brady Statistic AP VS Percent: 9 %
Brady Statistic AS VP Percent: 1 %
Brady Statistic AS VS Percent: 90 %
Brady Statistic RA Percent Paced: 8.7 %
Brady Statistic RV Percent Paced: 1 %
Date Time Interrogation Session: 20240823125657
HighPow Impedance: 60 Ohm
HighPow Impedance: 60 Ohm
Implantable Lead Connection Status: 753985
Implantable Lead Connection Status: 753985
Implantable Lead Implant Date: 20120420
Implantable Lead Implant Date: 20180613
Implantable Lead Location: 753859
Implantable Lead Location: 753860
Implantable Pulse Generator Implant Date: 20160212
Lead Channel Impedance Value: 310 Ohm
Lead Channel Impedance Value: 460 Ohm
Lead Channel Pacing Threshold Amplitude: 0.5 V
Lead Channel Pacing Threshold Amplitude: 0.75 V
Lead Channel Pacing Threshold Pulse Width: 0.5 ms
Lead Channel Pacing Threshold Pulse Width: 0.5 ms
Lead Channel Sensing Intrinsic Amplitude: 11.6 mV
Lead Channel Sensing Intrinsic Amplitude: 2.4 mV
Lead Channel Setting Pacing Amplitude: 2 V
Lead Channel Setting Pacing Amplitude: 2.5 V
Lead Channel Setting Pacing Pulse Width: 0.5 ms
Lead Channel Setting Sensing Sensitivity: 0.5 mV
Pulse Gen Serial Number: 7225079

## 2022-10-01 ENCOUNTER — Inpatient Hospital Stay: Payer: Medicare HMO | Admitting: Hematology and Oncology

## 2022-10-01 ENCOUNTER — Inpatient Hospital Stay: Payer: Medicare HMO

## 2022-10-01 ENCOUNTER — Inpatient Hospital Stay: Payer: Medicare HMO | Admitting: Nutrition

## 2022-10-01 ENCOUNTER — Encounter: Payer: Self-pay | Admitting: Hematology and Oncology

## 2022-10-01 VITALS — BP 132/68 | HR 60 | Temp 97.5°F | Resp 21

## 2022-10-01 VITALS — BP 134/72 | HR 64 | Temp 98.0°F | Resp 18 | Ht 70.0 in | Wt 127.2 lb

## 2022-10-01 DIAGNOSIS — Z79899 Other long term (current) drug therapy: Secondary | ICD-10-CM | POA: Diagnosis not present

## 2022-10-01 DIAGNOSIS — C3490 Malignant neoplasm of unspecified part of unspecified bronchus or lung: Secondary | ICD-10-CM | POA: Diagnosis present

## 2022-10-01 DIAGNOSIS — C3491 Malignant neoplasm of unspecified part of right bronchus or lung: Secondary | ICD-10-CM | POA: Diagnosis not present

## 2022-10-01 DIAGNOSIS — Z5112 Encounter for antineoplastic immunotherapy: Secondary | ICD-10-CM | POA: Diagnosis present

## 2022-10-01 DIAGNOSIS — D61818 Other pancytopenia: Secondary | ICD-10-CM | POA: Diagnosis not present

## 2022-10-01 DIAGNOSIS — E440 Moderate protein-calorie malnutrition: Secondary | ICD-10-CM | POA: Diagnosis not present

## 2022-10-01 DIAGNOSIS — I5022 Chronic systolic (congestive) heart failure: Secondary | ICD-10-CM | POA: Diagnosis not present

## 2022-10-01 DIAGNOSIS — Z7901 Long term (current) use of anticoagulants: Secondary | ICD-10-CM | POA: Diagnosis not present

## 2022-10-01 LAB — CBC WITH DIFFERENTIAL (CANCER CENTER ONLY)
Abs Immature Granulocytes: 0.02 10*3/uL (ref 0.00–0.07)
Basophils Absolute: 0 10*3/uL (ref 0.0–0.1)
Basophils Relative: 0 %
Eosinophils Absolute: 0.3 10*3/uL (ref 0.0–0.5)
Eosinophils Relative: 4 %
HCT: 35.8 % — ABNORMAL LOW (ref 39.0–52.0)
Hemoglobin: 11.5 g/dL — ABNORMAL LOW (ref 13.0–17.0)
Immature Granulocytes: 0 %
Lymphocytes Relative: 16 %
Lymphs Abs: 1.2 10*3/uL (ref 0.7–4.0)
MCH: 29.9 pg (ref 26.0–34.0)
MCHC: 32.1 g/dL (ref 30.0–36.0)
MCV: 93 fL (ref 80.0–100.0)
Monocytes Absolute: 1 10*3/uL (ref 0.1–1.0)
Monocytes Relative: 13 %
Neutro Abs: 5 10*3/uL (ref 1.7–7.7)
Neutrophils Relative %: 67 %
Platelet Count: 238 10*3/uL (ref 150–400)
RBC: 3.85 MIL/uL — ABNORMAL LOW (ref 4.22–5.81)
RDW: 23.3 % — ABNORMAL HIGH (ref 11.5–15.5)
WBC Count: 7.6 10*3/uL (ref 4.0–10.5)
nRBC: 0 % (ref 0.0–0.2)

## 2022-10-01 LAB — CMP (CANCER CENTER ONLY)
ALT: 21 U/L (ref 0–44)
AST: 27 U/L (ref 15–41)
Albumin: 3 g/dL — ABNORMAL LOW (ref 3.5–5.0)
Alkaline Phosphatase: 78 U/L (ref 38–126)
Anion gap: 5 (ref 5–15)
BUN: 10 mg/dL (ref 8–23)
CO2: 27 mmol/L (ref 22–32)
Calcium: 8.6 mg/dL — ABNORMAL LOW (ref 8.9–10.3)
Chloride: 106 mmol/L (ref 98–111)
Creatinine: 0.81 mg/dL (ref 0.61–1.24)
GFR, Estimated: >60 mL/min (ref >60)
Glucose, Bld: 100 mg/dL — ABNORMAL HIGH (ref 70–99)
Potassium: 4.2 mmol/L (ref 3.5–5.1)
Sodium: 138 mmol/L (ref 135–145)
Total Bilirubin: 0.4 mg/dL (ref 0.3–1.2)
Total Protein: 6.4 g/dL — ABNORMAL LOW (ref 6.5–8.1)

## 2022-10-01 LAB — TSH: TSH: 1.479 u[IU]/mL (ref 0.350–4.500)

## 2022-10-01 MED ORDER — SODIUM CHLORIDE 0.9% FLUSH
10.0000 mL | INTRAVENOUS | Status: DC | PRN
  Administered 2022-10-01: 10 mL

## 2022-10-01 MED ORDER — HEPARIN SOD (PORK) LOCK FLUSH 100 UNIT/ML IV SOLN
500.0000 [IU] | Freq: Once | INTRAVENOUS | Status: AC | PRN
  Administered 2022-10-01: 500 [IU]

## 2022-10-01 MED ORDER — SODIUM CHLORIDE 0.9 % IV SOLN: Freq: Once | INTRAVENOUS | Status: AC

## 2022-10-01 MED ORDER — SODIUM CHLORIDE 0.9% FLUSH
10.0000 mL | Freq: Once | INTRAVENOUS | Status: AC
  Administered 2022-10-01: 10 mL

## 2022-10-01 MED ORDER — SODIUM CHLORIDE 0.9 % IV SOLN
1500.0000 mg | Freq: Once | INTRAVENOUS | Status: AC
  Administered 2022-10-01: 1500 mg via INTRAVENOUS
  Filled 2022-10-01: qty 30

## 2022-10-01 NOTE — Patient Instructions (Signed)
[facility]  Discharge Instructions: Thank you for choosing [facility] to provide your oncology and hematology care.   If you have a lab appointment with the Cancer Center, please go directly to the Cancer Center and check in at the registration area.   Wear comfortable clothing and clothing appropriate for easy access to any Portacath or PICC line.   We strive to give you quality time with your provider. You may need to reschedule your appointment if you arrive late (15 or more minutes).  Arriving late affects you and other patients whose appointments are after yours.  Also, if you miss three or more appointments without notifying the office, you may be dismissed from the clinic at the provider's discretion.      For prescription refill requests, have your pharmacy contact our office and allow 72 hours for refills to be completed.    Today you received the following chemotherapy and/or immunotherapy agents: Durvalumab.      To help prevent nausea and vomiting after your treatment, we encourage you to take your nausea medication as directed.  BELOW ARE SYMPTOMS THAT SHOULD BE REPORTED IMMEDIATELY: *FEVER GREATER THAN 100.4 F (38 C) OR HIGHER *CHILLS OR SWEATING *NAUSEA AND VOMITING THAT IS NOT CONTROLLED WITH YOUR NAUSEA MEDICATION *UNUSUAL SHORTNESS OF BREATH *UNUSUAL BRUISING OR BLEEDING *URINARY PROBLEMS (pain or burning when urinating, or frequent urination) *BOWEL PROBLEMS (unusual diarrhea, constipation, pain near the anus) TENDERNESS IN MOUTH AND THROAT WITH OR WITHOUT PRESENCE OF ULCERS (sore throat, sores in mouth, or a toothache) UNUSUAL RASH, SWELLING OR PAIN  UNUSUAL VAGINAL DISCHARGE OR ITCHING   Items with * indicate a potential emergency and should be followed up as soon as possible or go to the Emergency Department if any problems should occur.  Please show the CHEMOTHERAPY ALERT CARD or IMMUNOTHERAPY ALERT CARD at  check-in to the Emergency Department and triage nurse.  Should you have questions after your visit or need to cancel or reschedule your appointment, please contact [facility]  Dept: [phone]  and follow the prompts.  Office hours are 8:00 a.m. to 4:30 p.m. Monday - Friday. Please note that voicemails left after 4:00 p.m. may not be returned until the following business day.  We are closed weekends and major holidays. You have access to a nurse at all times for urgent questions. Please call the main number to the clinic Dept: [phone] and follow the prompts.   For any non-urgent questions, you may also contact your provider using MyChart. We now offer e-Visits for anyone 18 and older to request care online for non-urgent symptoms. For details visit [email or web address].   Also download the MyChart app! Go to the app store, search "MyChart", open the app, select [facility], and log in with your MyChart username and password.  Durvalumab Injection What is this medication? DURVALUMAB (dur VAL ue mab) treats some types of cancer. It works by helping your immune system slow or stop the spread of cancer cells. It is a monoclonal antibody. This medicine may be used for other purposes; ask your health care provider or pharmacist if you have questions. COMMON BRAND NAME(S): IMFINZI What should I tell my care team before I take this medication? They need to know if you have any of these conditions: Allogeneic stem cell transplant (uses someone else's stem cells) Autoimmune diseases, such as Crohn disease, ulcerative colitis, lupus History of chest radiation Nervous system   problems, such as Guillain-Barre syndrome, myasthenia gravis Organ transplant An unusual or allergic reaction to durvalumab, other medications, foods, dyes, or preservatives Pregnant or trying to get pregnant Breast-feeding How should I use this medication? This medication is infused  into a vein. It is given by your care team in a hospital or clinic setting. A special MedGuide will be given to you before each treatment. Be sure to read this information carefully each time. Talk to your care team about the use of this medication in children. Special care may be needed. Overdosage: If you think you have taken too much of this medicine contact a poison control center or emergency room at once. NOTE: This medicine is only for you. Do not share this medicine with others. What if I miss a dose? Keep appointments for follow-up doses. It is important not to miss your dose. Call your care team if you are unable to keep an appointment. What may interact with this medication? Interactions have not been studied. This list may not describe all possible interactions. Give your health care provider a list of all the medicines, herbs, non-prescription drugs, or dietary supplements you use. Also tell them if you smoke, drink alcohol, or use illegal drugs. Some items may interact with your medicine. What should I watch for while using this medication? Your condition will be monitored carefully while you are receiving this medication. You may need blood work while taking this medication. This medication may cause serious skin reactions. They can happen weeks to months after starting the medication. Contact your care team right away if you notice fevers or flu-like symptoms with a rash. The rash may be red or purple and then turn into blisters or peeling of the skin. You may also notice a red rash with swelling of the face, lips, or lymph nodes in your neck or under your arms. Tell your care team right away if you have any change in your eyesight. Talk to your care team if you may be pregnant. Serious birth defects can occur if you take this medication during pregnancy and for 3 months after the last dose. You will need a negative pregnancy test before starting this medication. Contraception is  recommended while taking this medication and for 3 months after the last dose. Your care team can help you find the option that works for you. Do not breastfeed while taking this medication and for 3 months after the last dose. What side effects may I notice from receiving this medication? Side effects that you should report to your care team as soon as possible: Allergic reactions--skin rash, itching, hives, swelling of the face, lips, tongue, or throat Dry cough, shortness of breath or trouble breathing Eye pain, redness, irritation, or discharge with blurry or decreased vision Heart muscle inflammation--unusual weakness or fatigue, shortness of breath, chest pain, fast or irregular heartbeat, dizziness, swelling of the ankles, feet, or hands Hormone gland problems--headache, sensitivity to light, unusual weakness or fatigue, dizziness, fast or irregular heartbeat, increased sensitivity to cold or heat, excessive sweating, constipation, hair loss, increased thirst or amount of urine, tremors or shaking, irritability Infusion reactions--chest pain, shortness of breath or trouble breathing, feeling faint or lightheaded Kidney injury (glomerulonephritis)--decrease in the amount of urine, red or dark brown urine, foamy or bubbly urine, swelling of the ankles, hands, or feet Liver injury--right upper belly pain, loss of appetite, nausea, light-colored stool, dark yellow or brown urine, yellowing skin or eyes, unusual weakness or fatigue Pain, tingling, or numbness   in the hands or feet, muscle weakness, change in vision, confusion or trouble speaking, loss of balance or coordination, trouble walking, seizures Rash, fever, and swollen lymph nodes Redness, blistering, peeling, or loosening of the skin, including inside the mouth Sudden or severe stomach pain, bloody diarrhea, fever, nausea, vomiting Side effects that usually do not require medical attention (report these to your care team if they continue  or are bothersome): Bone, joint, or muscle pain Diarrhea Fatigue Loss of appetite Nausea Skin rash This list may not describe all possible side effects. Call your doctor for medical advice about side effects. You may report side effects to FDA at 1-800-FDA-1088. Where should I keep my medication? This medication is given in a hospital or clinic. It will not be stored at home. NOTE: This sheet is a summary. It may not cover all possible information. If you have questions about this medicine, talk to your doctor, pharmacist, or health care provider.  2024 Elsevier/Gold Standard (2021-06-05 00:00:00)  

## 2022-10-01 NOTE — Assessment & Plan Note (Signed)
His malnutrition overall is improving He has appointment to follow-up with dietitian

## 2022-10-01 NOTE — Assessment & Plan Note (Signed)
I have reviewed multiple imaging studies with the patient and his wife Overall, he has excellent response to therapy The hypermetabolic changes in his lungs consistent with radiation effect We will proceed with immunotherapy as scheduled I plan to repeat imaging study again at the end of the year

## 2022-10-01 NOTE — Assessment & Plan Note (Signed)
His pancytopenia has improved dramatically since discontinuation of chemo and radiation Observe closely

## 2022-10-01 NOTE — Progress Notes (Signed)
Nutrition follow up completed with patient and wife during infusion for NSCLC. He is followed by Dr. Bertis Ruddy.  Weight is stable at 127 pounds 3.2 oz on Aug 27. This is stable from 127.6 pounds on Aug 8. Remains underweight with BMI at 18.25.  Labs include Glucose 100, Albumin 3.0.  Patient reports things are about the same. No voiced nutrition impact symptoms. He continues to have some problems with taste. He is eating about the same. Continues one oral nutrition supplement every other day. Declines coupons.  Nutrition Diagnosis: Unintentional wt loss, stable.  Intervention: Continue small frequent meals and snacks. Increase oral nutrition supplements to one daily. Educated on strategies for improving taste alterations. Provided nutrition fact sheet.   Monitoring, Evaluation, Goals: Consume adequate calories and protein to minimize wt loss.  Next Visit: Tuesday, Sept 24, during infusion.  **Disclaimer: This note was dictated with voice recognition software. Similar sounding words can inadvertently be transcribed and this note may contain transcription errors which may not have been corrected upon publication of note.**

## 2022-10-01 NOTE — Progress Notes (Signed)
[facility] OFFICE PROGRESS NOTE  Patient Care Team: [name], [name], [name], [name]  ASSESSMENT & PLAN:  Non-small cell lung cancer [facility]) I have reviewed multiple imaging studies with the patient and his wife Overall, he has excellent response to therapy The hypermetabolic changes in his lungs consistent with radiation effect We will proceed with immunotherapy as scheduled I plan to repeat imaging study again at the end of the year  Pancytopenia, acquired [facility]) His pancytopenia has improved dramatically since discontinuation of chemo and radiation Observe closely  Chronic systolic CHF (congestive heart failure) ([name]) He is doing well with no signs or symptoms of congestive heart failure He will continue medical management  Malnutrition of moderate degree His malnutrition overall is improving He has appointment to follow-up with dietitian  No orders of the defined types were placed in this encounter.   All questions were answered. The patient knows to call the clinic with any problems, questions or concerns. The total time spent in the appointment was 40 minutes encounter with patients including review of chart and various tests results, discussions about plan of care and coordination of care plan   Artis Delay, MD 10/01/2022 1:26 PM  INTERVAL HISTORY: Please see below for problem oriented charting. he returns for treatment follow-up He is doing better He has not lost weight since last time I saw him He denies recent cough, chest pain or shortness of breath We reviewed test results and plan of care I reviewed PET/CT imaging with him and his wife  REVIEW OF SYSTEMS:   Constitutional: Denies fevers, chills or abnormal weight loss Eyes: Denies blurriness of vision Ears, nose, mouth, throat, and face: Denies mucositis or  sore throat Respiratory: Denies cough, dyspnea or wheezes Cardiovascular: Denies palpitation, chest discomfort or lower extremity swelling Gastrointestinal:  Denies nausea, heartburn or change in bowel habits Skin: Denies abnormal skin rashes Lymphatics: Denies new lymphadenopathy or easy bruising Neurological:Denies numbness, tingling or new weaknesses Behavioral/Psych: Mood is stable, no new changes  All other systems were reviewed with the patient and are negative.  I have reviewed the past medical history, past surgical history, social history and family history with the patient and they are unchanged from previous note.  ALLERGIES:  is allergic to paclitaxel.  MEDICATIONS:  Current Outpatient Medications  Medication Sig Dispense Refill   furosemide (LASIX) 40 MG tablet TAKE 1/2 TABLET BY MOUTH EVERY DAY 45 tablet 3   acetaminophen (TYLENOL) 500 MG tablet Take 500-1,000 mg by mouth every 8 (eight) hours as needed (for pain).      amiodarone (PACERONE) 400 MG tablet Take 0.5 tablets (200 mg total) by mouth 2 (two) times daily. 28 tablet 0   atorvastatin (LIPITOR) 80 MG tablet TAKE 1 TABLET BY MOUTH DAILY AT 6 PM (Patient taking differently: Take 80 mg by mouth daily. TAKE 1 TABLET BY MOUTH  DAILY AT 6 PM) 90 tablet 3   melatonin 5 MG TABS Take 1 tablet (5 mg total) by mouth at bedtime. 30 tablet 0   metoprolol succinate (TOPROL-XL) 50 MG 24 hr tablet Take 1 tablet (50 mg total) by mouth 2 (two) times daily. Take with or immediately following a meal. 60 tablet 3   mometasone-formoterol (DULERA) 100-5 MCG/ACT AERO Inhale 2 puffs into the lungs in the morning and at bedtime. 8.8 each 1   Multiple Vitamin (  MULTIVITAMIN) capsule Take 1 capsule by mouth daily.     ondansetron (ZOFRAN) 8 MG tablet Take 1 tablet (8 mg total) by mouth every 8 (eight) hours as needed for nausea. 30 tablet 3   pantoprazole (PROTONIX) 40 MG tablet Take 40 mg by mouth daily.     rivaroxaban (XARELTO) 20 MG TABS  tablet Take 1 tablet (20 mg total) by mouth daily with supper. 90 tablet 1   sacubitril-valsartan (ENTRESTO) 24-26 MG Take 1 tablet by mouth 2 (two) times daily. 180 tablet 3   senna-docusate (SENOKOT-S) 8.6-50 MG tablet Take 2 tablets by mouth daily. 60 tablet 1   spironolactone (ALDACTONE) 25 MG tablet Take 1 tablet (25 mg total) by mouth daily. 30 tablet 3   No current facility-administered medications for this visit.   Facility-Administered Medications Ordered in Other Visits  Medication Dose Route Frequency Provider Last Rate Last Admin   durvalumab (IMFINZI) 1,500 mg in sodium chloride 0.9 % 100 mL chemo infusion  1,500 mg Intravenous Once Bertis Ruddy, Angeliah Wisdom, MD 130 mL/hr at 10/01/22 1237 1,500 mg at 10/01/22 1237   heparin lock flush 100 unit/mL  500 Units Intracatheter Once PRN Bertis Ruddy, Cray Monnin, MD       sodium chloride flush (NS) 0.9 % injection 10 mL  10 mL Intracatheter PRN Artis Delay, MD        SUMMARY OF ONCOLOGIC HISTORY: Oncology History Overview Note  Foundation One test showed no actionable mutations. 6TMB, MSI low   Non-small cell lung cancer ([name])  06/11/2022 Initial Diagnosis   Non-small cell lung cancer ([name])   06/20/2022 Pathology Results   CYTOLOGY - NON PAP  CASE: [id number]  PATIENT: Jalien Keidel  Non-Gynecological Cytology Report   Clinical History: Right hilar mass  Specimen Submitted:  A. LUNG, RIGHT HILAR MASS, FINE NEEDLE ASPIRATION:    FINAL MICROSCOPIC DIAGNOSIS:  - Malignant cells consistent with non-small cell carcinoma  - See comment   SPECIMEN ADEQUACY:  Satisfactory for evaluation   DIAGNOSTIC COMMENTS:  The specimen contains atypical cells with malignant appearance characterized by nuclear enlargement, pleomorphism, clumped chromatin and occasional nucleoli. Immunohistochemical stains were attempted however no cells were present on the stains. The morphology does not support a small cell carcinoma with differential diagnosis including  adenocarcinoma, large cell neuroendocrine carcinoma, or large cell carcinoma, et al. More diagnostic material is needed for further classification and molecular tests.    07/02/2022 PET scan   1. Right suprahilar hypermetabolic mass, consistent with primary lung malignancy. 2. Subcarinal hypermetabolic activity which is contiguous with right hilar mass, possibly due to mediastinal extension or subcarinal lymphadenopathy. 3. No evidence of metastatic disease in the abdomen or pelvis. 4. Right adrenal gland mass with low-level FDG uptake, unchanged when compared with prior exams dating back to 2018 and consistent with benign adenoma. 5. Hypermetabolic right paraspinal consolidation, likely infectious or inflammatory. 6. Aortic Atherosclerosis (ICD10-I70.0) and Emphysema (ICD10-J43.9).     07/02/2022 Cancer Staging   Staging form: Lung, AJCC 8th Edition - Clinical stage from 07/02/2022: Stage IIIA (cT2, cN2, cM0) - Signed by Artis Delay, MD on 07/02/2022 Stage prefix: Initial diagnosis   07/09/2022 Procedure   Successful placement of a right internal jugular approach power injectable Port-A-Cath. The catheter is ready for immediate use.     07/15/2022 - 08/12/2022 Chemotherapy   Patient is on Treatment Plan : LUNG Carboplatin + Paclitaxel + XRT q7d     10/01/2022 -  Chemotherapy   Patient is on Treatment Plan : LUNG NSCLC Durvalumab (  1500) q28d     10/01/2022 PET scan   NM PET Image Restage (PS) Skull Base to Thigh (F-18 FDG)  Result Date: 09/30/2022 CLINICAL DATA:  Subsequent treatment strategy for non-small cell lung cancer. EXAM: NUCLEAR MEDICINE PET SKULL BASE TO THIGH TECHNIQUE: 6.33 mCi F-18 FDG was injected intravenously. Full-ring PET imaging was performed from the skull base to thigh after the radiotracer. CT data was obtained and used for attenuation correction and anatomic localization. Fasting blood glucose: 89 mg/dl COMPARISON:  PET-CT 21/30/8657.  Chest CT 06/03/2022. FINDINGS:  Mediastinal blood pool activity: SUV max 2.1 NECK: No hypermetabolic cervical lymph nodes are identified.Asymmetric activity within the left pterygoid musculature, likely physiologic. No suspicious activity identified within the pharyngeal mucosal space. Incidental CT findings: Prominent bilateral carotid atherosclerosis. CHEST: There is no residual hypermetabolic activity within the previously demonstrated right suprahilar mass which has significantly decreased in size in the interval. This currently measures approximately 2.5 x 2.0 cm on image 32/7 and has an SUV max of 3.5 (previously 23.4). Similar 1.2 cm short axis subcarinal node on image 71/4 within SUV max of 3.5 (previously 2.6). No other hypermetabolic mediastinal, hilar or axillary lymph nodes. There are new extensive bandlike opacities throughout the right lung with a perihilar distribution. These demonstrate hypermetabolic activity with an SUV max of 11.5 posteriorly. Based on distribution, these findings are most likely secondary to radiation pneumonitis. No abnormal metabolic activity or suspicious nodularity in the left lung. Incidental CT findings: Moderate to severe centrilobular emphysema with diffuse central airway thickening and scattered pulmonary scarring. Atherosclerosis of the aorta, great vessels and coronary arteries. Right IJ Port-A-Cath extends to the superior cavoatrial junction. Left subclavian pacemaker leads appear unchanged. ABDOMEN/PELVIS: There is no hypermetabolic activity within the liver, left adrenal gland, spleen or pancreas. A right adrenal nodule with low level metabolic activity is unchanged. This measures 4.2 x 3.1 cm on image 107/4 and has an SUV max of 3.2. There is no hypermetabolic nodal activity in the abdomen or pelvis. Incidental CT findings: Diffuse aortic and branch vessel atherosclerosis status post aortoiliac endograft stenting. The excluded aneurysm measures approximately 4.5 cm in AP diameter, unchanged.  Stable bilateral renal cysts for which no follow-up imaging is recommended. Stable marked enlargement of the prostate gland without hypermetabolic activity. SKELETON: There is no hypermetabolic activity to suggest osseous metastatic disease. Incidental CT findings: Multilevel spondylosis. IMPRESSION: 1. Significant interval response to therapy. The previously demonstrated right suprahilar mass has significantly decreased in size and is no longer hypermetabolic. 2. New hypermetabolic bandlike opacities throughout the right lung with a perihilar distribution, most consistent with radiation pneumonitis. Infection considered less likely. Attention on follow-up recommended. 3. No definite signs of metastatic disease. A mildly hypermetabolic subcarinal lymph node is similar to the prior study and likely reactive. 4. Stable right adrenal nodule with low level metabolic activity, likely an adenoma based on long-term stability from previous CTs dating back to 10/14/2003. 5. Aortic Atherosclerosis (ICD10-I70.0) and Emphysema (ICD10-J43.9). Electronically Signed   By: Carey Bullocks M.D.   On: 09/30/2022 15:09   CUP PACEART REMOTE DEVICE CHECK  Result Date: 09/29/2022 Scheduled remote reviewed. Normal device function.  The device estimates 10.4 months until ERI There was one atrial arrhythmia detected that was a short burst of noise There was one 30 second NSVT that 122 bpm and appear to be ST and not VT Next remote 91 days. Hassell Halim, RN, CCDS, [employer]       PHYSICAL EXAMINATION: ECOG PERFORMANCE  STATUS: 2 - Symptomatic, <50% confined to bed  Vitals:   10/01/22 1036  BP: 134/72  Pulse: 64  Resp: 18  Temp: 98 F (36.7 C)  SpO2: 99%   Filed Weights   10/01/22 1036  Weight: 127 lb 3.2 oz (57.7 kg)    GENERAL:alert, no distress and comfortable SKIN: skin color, texture, turgor are normal, no rashes or significant lesions EYES: normal, Conjunctiva are pink and non-injected, sclera  clear OROPHARYNX:no exudate, no erythema and lips, buccal mucosa, and tongue normal  NECK: supple, thyroid normal size, non-tender, without nodularity LYMPH:  no palpable lymphadenopathy in the cervical, axillary or inguinal LUNGS: clear to auscultation and percussion with normal breathing effort HEART: regular rate & rhythm and no murmurs and no lower extremity edema ABDOMEN:abdomen soft, non-tender and normal bowel sounds Musculoskeletal:no cyanosis of digits and no clubbing  NEURO: alert & oriented x 3 with fluent speech, no focal motor/sensory deficits  LABORATORY DATA:  I have reviewed the data as listed    Component Value Date/Time   NA 138 10/01/2022 1003   NA 138 04/15/2022 0913   K 4.2 10/01/2022 1003   CL 106 10/01/2022 1003   CO2 27 10/01/2022 1003   GLUCOSE 100 (H) 10/01/2022 1003   BUN 10 10/01/2022 1003   BUN 7 (L) 04/15/2022 0913   CREATININE 0.81 10/01/2022 1003   CREATININE 0.88 09/15/2015 0959   CALCIUM 8.6 (L) 10/01/2022 1003   PROT 6.4 (L) 10/01/2022 1003   PROT 6.8 11/07/2020 1303   ALBUMIN 3.0 (L) 10/01/2022 1003   ALBUMIN 3.6 (L) 11/07/2020 1303   AST 27 10/01/2022 1003   ALT 21 10/01/2022 1003   ALKPHOS 78 10/01/2022 1003   BILITOT 0.4 10/01/2022 1003   GFRNONAA >60 10/01/2022 1003   GFRAA 96 03/17/2020 1016    No results found for: "SPEP", "UPEP"  Lab Results  Component Value Date   WBC 7.6 10/01/2022   NEUTROABS 5.0 10/01/2022   HGB 11.5 (L) 10/01/2022   HCT 35.8 (L) 10/01/2022   MCV 93.0 10/01/2022   PLT 238 10/01/2022      Chemistry      Component Value Date/Time   NA 138 10/01/2022 1003   NA 138 04/15/2022 0913   K 4.2 10/01/2022 1003   CL 106 10/01/2022 1003   CO2 27 10/01/2022 1003   BUN 10 10/01/2022 1003   BUN 7 (L) 04/15/2022 0913   CREATININE 0.81 10/01/2022 1003   CREATININE 0.88 09/15/2015 0959      Component Value Date/Time   CALCIUM 8.6 (L) 10/01/2022 1003   ALKPHOS 78 10/01/2022 1003   AST 27 10/01/2022 1003    ALT 21 10/01/2022 1003   BILITOT 0.4 10/01/2022 1003       RADIOGRAPHIC STUDIES: I have reviewed imaging studies with the patient and his wife I have personally reviewed the radiological images as listed and agreed with the findings in the report. NM PET Image Restage (PS) Skull Base to Thigh (F-18 FDG)  Result Date: 09/30/2022 CLINICAL DATA:  Subsequent treatment strategy for non-small cell lung cancer. EXAM: NUCLEAR MEDICINE PET SKULL BASE TO THIGH TECHNIQUE: 6.33 mCi F-18 FDG was injected intravenously. Full-ring PET imaging was performed from the skull base to thigh after the radiotracer. CT data was obtained and used for attenuation correction and anatomic localization. Fasting blood glucose: 89 mg/dl COMPARISON:  PET-CT 21/30/8657.  Chest CT 06/03/2022. FINDINGS: Mediastinal blood pool activity: SUV max 2.1 NECK: No hypermetabolic cervical lymph nodes are identified.Asymmetric activity  within the left pterygoid musculature, likely physiologic. No suspicious activity identified within the pharyngeal mucosal space. Incidental CT findings: Prominent bilateral carotid atherosclerosis. CHEST: There is no residual hypermetabolic activity within the previously demonstrated right suprahilar mass which has significantly decreased in size in the interval. This currently measures approximately 2.5 x 2.0 cm on image 32/7 and has an SUV max of 3.5 (previously 23.4). Similar 1.2 cm short axis subcarinal node on image 71/4 within SUV max of 3.5 (previously 2.6). No other hypermetabolic mediastinal, hilar or axillary lymph nodes. There are new extensive bandlike opacities throughout the right lung with a perihilar distribution. These demonstrate hypermetabolic activity with an SUV max of 11.5 posteriorly. Based on distribution, these findings are most likely secondary to radiation pneumonitis. No abnormal metabolic activity or suspicious nodularity in the left lung. Incidental CT findings: Moderate to severe  centrilobular emphysema with diffuse central airway thickening and scattered pulmonary scarring. Atherosclerosis of the aorta, great vessels and coronary arteries. Right IJ Port-A-Cath extends to the superior cavoatrial junction. Left subclavian pacemaker leads appear unchanged. ABDOMEN/PELVIS: There is no hypermetabolic activity within the liver, left adrenal gland, spleen or pancreas. A right adrenal nodule with low level metabolic activity is unchanged. This measures 4.2 x 3.1 cm on image 107/4 and has an SUV max of 3.2. There is no hypermetabolic nodal activity in the abdomen or pelvis. Incidental CT findings: Diffuse aortic and branch vessel atherosclerosis status post aortoiliac endograft stenting. The excluded aneurysm measures approximately 4.5 cm in AP diameter, unchanged. Stable bilateral renal cysts for which no follow-up imaging is recommended. Stable marked enlargement of the prostate gland without hypermetabolic activity. SKELETON: There is no hypermetabolic activity to suggest osseous metastatic disease. Incidental CT findings: Multilevel spondylosis. IMPRESSION: 1. Significant interval response to therapy. The previously demonstrated right suprahilar mass has significantly decreased in size and is no longer hypermetabolic. 2. New hypermetabolic bandlike opacities throughout the right lung with a perihilar distribution, most consistent with radiation pneumonitis. Infection considered less likely. Attention on follow-up recommended. 3. No definite signs of metastatic disease. A mildly hypermetabolic subcarinal lymph node is similar to the prior study and likely reactive. 4. Stable right adrenal nodule with low level metabolic activity, likely an adenoma based on long-term stability from previous CTs dating back to 10/14/2003. 5. Aortic Atherosclerosis (ICD10-I70.0) and Emphysema (ICD10-J43.9). Electronically Signed   By: Carey Bullocks M.D.   On: 09/30/2022 15:09   CUP PACEART REMOTE DEVICE  CHECK  Result Date: 09/29/2022 Scheduled remote reviewed. Normal device function.  The device estimates 10.4 months until ERI There was one atrial arrhythmia detected that was a short burst of noise There was one 30 second NSVT that 122 bpm and appear to be ST and not VT Next remote 91 days. Hassell Halim, RN, CCDS, [employer]

## 2022-10-01 NOTE — Assessment & Plan Note (Signed)
He is doing well with no signs or symptoms of congestive heart failure He will continue medical management

## 2022-10-02 ENCOUNTER — Telehealth: Payer: Self-pay

## 2022-10-02 LAB — T4: T4, Total: 13.4 ug/dL — ABNORMAL HIGH (ref 4.5–12.0)

## 2022-10-02 NOTE — Telephone Encounter (Signed)
Dr. Bertis Ruddy - first time Imfinzi f/u call - pt tolerated well Received: Donney Dice, RN  P Onc Triage Nurse [facility] Caller: Unspecified (Yesterday,  1:49 PM)

## 2022-10-02 NOTE — Telephone Encounter (Signed)
Benjamin Cook states that her husband Dniel is doing fine. He is eating, drinking, and urinating well. They know to call the office at [phone] if he has any questions or concerns.

## 2022-10-02 NOTE — Progress Notes (Signed)
Remote ICD transmission.   

## 2022-10-09 ENCOUNTER — Encounter: Payer: Self-pay | Admitting: Hematology and Oncology

## 2022-10-09 NOTE — Progress Notes (Signed)
Pt's spouse left voicemail regarding financial resources.  Returned call back introducing myself and to listen to concerns. She states they are in need of home repairs.  Advised he has an active [employer] and provided the balance which may help free up some funds towards other expenses. Advised I do not have access to any other assistance.   Will send referral to social worker for any other resources.  She has my card for any additional financial questions or concerns.

## 2022-10-29 ENCOUNTER — Inpatient Hospital Stay: Payer: Medicare HMO

## 2022-10-29 ENCOUNTER — Other Ambulatory Visit: Payer: Self-pay | Admitting: Hematology and Oncology

## 2022-10-29 ENCOUNTER — Inpatient Hospital Stay (HOSPITAL_COMMUNITY)
Admission: EM | Admit: 2022-10-29 | Discharge: 2022-11-06 | DRG: 291 | Disposition: A | Discharge status: Home Health | Payer: Medicare HMO | Attending: Family Medicine | Admitting: Family Medicine

## 2022-10-29 ENCOUNTER — Telehealth: Payer: Self-pay

## 2022-10-29 ENCOUNTER — Inpatient Hospital Stay: Payer: Medicare HMO | Admitting: Nutrition

## 2022-10-29 ENCOUNTER — Other Ambulatory Visit: Payer: Self-pay

## 2022-10-29 ENCOUNTER — Emergency Department (HOSPITAL_COMMUNITY): Payer: Medicare HMO

## 2022-10-29 ENCOUNTER — Inpatient Hospital Stay (HOSPITAL_COMMUNITY): Payer: Medicare HMO

## 2022-10-29 ENCOUNTER — Inpatient Hospital Stay: Payer: Medicare HMO | Admitting: Hematology and Oncology

## 2022-10-29 ENCOUNTER — Encounter (HOSPITAL_COMMUNITY): Payer: Self-pay | Admitting: Family Medicine

## 2022-10-29 DIAGNOSIS — D6832 Hemorrhagic disorder due to extrinsic circulating anticoagulants: Secondary | ICD-10-CM | POA: Diagnosis present

## 2022-10-29 DIAGNOSIS — Z1152 Encounter for screening for COVID-19: Secondary | ICD-10-CM | POA: Diagnosis not present

## 2022-10-29 DIAGNOSIS — J440 Chronic obstructive pulmonary disease with acute lower respiratory infection: Secondary | ICD-10-CM | POA: Diagnosis present

## 2022-10-29 DIAGNOSIS — Z681 Body mass index (BMI) 19 or less, adult: Secondary | ICD-10-CM | POA: Diagnosis not present

## 2022-10-29 DIAGNOSIS — I110 Hypertensive heart disease with heart failure: Principal | ICD-10-CM | POA: Diagnosis present

## 2022-10-29 DIAGNOSIS — D649 Anemia, unspecified: Secondary | ICD-10-CM | POA: Diagnosis present

## 2022-10-29 DIAGNOSIS — F17290 Nicotine dependence, other tobacco product, uncomplicated: Secondary | ICD-10-CM | POA: Diagnosis present

## 2022-10-29 DIAGNOSIS — Z515 Encounter for palliative care: Secondary | ICD-10-CM | POA: Diagnosis not present

## 2022-10-29 DIAGNOSIS — D62 Acute posthemorrhagic anemia: Secondary | ICD-10-CM | POA: Diagnosis not present

## 2022-10-29 DIAGNOSIS — R739 Hyperglycemia, unspecified: Secondary | ICD-10-CM | POA: Diagnosis not present

## 2022-10-29 DIAGNOSIS — Z7189 Other specified counseling: Secondary | ICD-10-CM | POA: Diagnosis not present

## 2022-10-29 DIAGNOSIS — G629 Polyneuropathy, unspecified: Secondary | ICD-10-CM | POA: Diagnosis present

## 2022-10-29 DIAGNOSIS — Y738 Miscellaneous gastroenterology and urology devices associated with adverse incidents, not elsewhere classified: Secondary | ICD-10-CM | POA: Diagnosis present

## 2022-10-29 DIAGNOSIS — I5023 Acute on chronic systolic (congestive) heart failure: Secondary | ICD-10-CM | POA: Diagnosis present

## 2022-10-29 DIAGNOSIS — I739 Peripheral vascular disease, unspecified: Secondary | ICD-10-CM | POA: Diagnosis present

## 2022-10-29 DIAGNOSIS — J439 Emphysema, unspecified: Secondary | ICD-10-CM | POA: Diagnosis present

## 2022-10-29 DIAGNOSIS — E440 Moderate protein-calorie malnutrition: Secondary | ICD-10-CM | POA: Diagnosis present

## 2022-10-29 DIAGNOSIS — I480 Paroxysmal atrial fibrillation: Secondary | ICD-10-CM | POA: Diagnosis present

## 2022-10-29 DIAGNOSIS — K219 Gastro-esophageal reflux disease without esophagitis: Secondary | ICD-10-CM | POA: Diagnosis present

## 2022-10-29 DIAGNOSIS — T8383XA Hemorrhage of genitourinary prosthetic devices, implants and grafts, initial encounter: Secondary | ICD-10-CM | POA: Diagnosis not present

## 2022-10-29 DIAGNOSIS — J189 Pneumonia, unspecified organism: Secondary | ICD-10-CM | POA: Diagnosis present

## 2022-10-29 DIAGNOSIS — Z7901 Long term (current) use of anticoagulants: Secondary | ICD-10-CM

## 2022-10-29 DIAGNOSIS — K5900 Constipation, unspecified: Secondary | ICD-10-CM | POA: Diagnosis present

## 2022-10-29 DIAGNOSIS — L4050 Arthropathic psoriasis, unspecified: Secondary | ICD-10-CM | POA: Diagnosis present

## 2022-10-29 DIAGNOSIS — Z79899 Other long term (current) drug therapy: Secondary | ICD-10-CM

## 2022-10-29 DIAGNOSIS — R9431 Abnormal electrocardiogram [ECG] [EKG]: Secondary | ICD-10-CM | POA: Insufficient documentation

## 2022-10-29 DIAGNOSIS — C3491 Malignant neoplasm of unspecified part of right bronchus or lung: Secondary | ICD-10-CM | POA: Diagnosis not present

## 2022-10-29 DIAGNOSIS — R54 Age-related physical debility: Secondary | ICD-10-CM | POA: Diagnosis present

## 2022-10-29 DIAGNOSIS — R110 Nausea: Secondary | ICD-10-CM | POA: Diagnosis present

## 2022-10-29 DIAGNOSIS — J9601 Acute respiratory failure with hypoxia: Principal | ICD-10-CM | POA: Diagnosis present

## 2022-10-29 DIAGNOSIS — J449 Chronic obstructive pulmonary disease, unspecified: Secondary | ICD-10-CM | POA: Diagnosis not present

## 2022-10-29 DIAGNOSIS — I700 Atherosclerosis of aorta: Secondary | ICD-10-CM | POA: Diagnosis present

## 2022-10-29 DIAGNOSIS — E785 Hyperlipidemia, unspecified: Secondary | ICD-10-CM | POA: Diagnosis present

## 2022-10-29 DIAGNOSIS — Z923 Personal history of irradiation: Secondary | ICD-10-CM

## 2022-10-29 DIAGNOSIS — E876 Hypokalemia: Secondary | ICD-10-CM | POA: Diagnosis not present

## 2022-10-29 DIAGNOSIS — M792 Neuralgia and neuritis, unspecified: Secondary | ICD-10-CM | POA: Diagnosis not present

## 2022-10-29 DIAGNOSIS — C3490 Malignant neoplasm of unspecified part of unspecified bronchus or lung: Secondary | ICD-10-CM | POA: Diagnosis present

## 2022-10-29 DIAGNOSIS — Z7951 Long term (current) use of inhaled steroids: Secondary | ICD-10-CM

## 2022-10-29 DIAGNOSIS — R339 Retention of urine, unspecified: Secondary | ICD-10-CM | POA: Diagnosis not present

## 2022-10-29 DIAGNOSIS — Z8679 Personal history of other diseases of the circulatory system: Secondary | ICD-10-CM

## 2022-10-29 DIAGNOSIS — I456 Pre-excitation syndrome: Secondary | ICD-10-CM | POA: Diagnosis present

## 2022-10-29 DIAGNOSIS — T451X5A Adverse effect of antineoplastic and immunosuppressive drugs, initial encounter: Secondary | ICD-10-CM | POA: Diagnosis present

## 2022-10-29 DIAGNOSIS — I4510 Unspecified right bundle-branch block: Secondary | ICD-10-CM | POA: Diagnosis present

## 2022-10-29 DIAGNOSIS — Z66 Do not resuscitate: Secondary | ICD-10-CM | POA: Diagnosis not present

## 2022-10-29 DIAGNOSIS — Z955 Presence of coronary angioplasty implant and graft: Secondary | ICD-10-CM

## 2022-10-29 DIAGNOSIS — Z888 Allergy status to other drugs, medicaments and biological substances status: Secondary | ICD-10-CM

## 2022-10-29 DIAGNOSIS — I5022 Chronic systolic (congestive) heart failure: Secondary | ICD-10-CM | POA: Diagnosis present

## 2022-10-29 DIAGNOSIS — I255 Ischemic cardiomyopathy: Secondary | ICD-10-CM | POA: Diagnosis present

## 2022-10-29 DIAGNOSIS — Z7961 Long term (current) use of immunomodulator: Secondary | ICD-10-CM

## 2022-10-29 DIAGNOSIS — I5021 Acute systolic (congestive) heart failure: Secondary | ICD-10-CM | POA: Diagnosis not present

## 2022-10-29 DIAGNOSIS — Z95810 Presence of automatic (implantable) cardiac defibrillator: Secondary | ICD-10-CM

## 2022-10-29 DIAGNOSIS — I2510 Atherosclerotic heart disease of native coronary artery without angina pectoris: Secondary | ICD-10-CM | POA: Diagnosis present

## 2022-10-29 DIAGNOSIS — G4700 Insomnia, unspecified: Secondary | ICD-10-CM | POA: Diagnosis present

## 2022-10-29 HISTORY — DX: Chronic systolic (congestive) heart failure: I50.22

## 2022-10-29 HISTORY — DX: Ventricular tachycardia, unspecified: I47.20

## 2022-10-29 LAB — I-STAT VENOUS BLOOD GAS, ED
Acid-base deficit: 3 mmol/L — ABNORMAL HIGH (ref 0.0–2.0)
Bicarbonate: 23.2 mmol/L (ref 20.0–28.0)
Calcium, Ion: 1.16 mmol/L (ref 1.15–1.40)
HCT: 41 % (ref 39.0–52.0)
Hemoglobin: 13.9 g/dL (ref 13.0–17.0)
O2 Saturation: 72 %
Potassium: 3.7 mmol/L (ref 3.5–5.1)
Sodium: 140 mmol/L (ref 135–145)
TCO2: 24 mmol/L (ref 22–32)
pCO2, Ven: 43.6 mmHg — ABNORMAL LOW (ref 44–60)
pH, Ven: 7.333 (ref 7.25–7.43)
pO2, Ven: 40 mmHg (ref 32–45)

## 2022-10-29 LAB — RESPIRATORY PANEL BY PCR

## 2022-10-29 LAB — CBC WITH DIFFERENTIAL/PLATELET
Abs Immature Granulocytes: 0.08 10*3/uL — ABNORMAL HIGH (ref 0.00–0.07)
Basophils Absolute: 0 10*3/uL (ref 0.0–0.1)
Basophils Relative: 0 %
Eosinophils Absolute: 0.1 10*3/uL (ref 0.0–0.5)
Eosinophils Relative: 1 %
HCT: 40.8 % (ref 39.0–52.0)
Hemoglobin: 13.1 g/dL (ref 13.0–17.0)
Immature Granulocytes: 1 %
Lymphocytes Relative: 10 %
Lymphs Abs: 1.4 10*3/uL (ref 0.7–4.0)
MCH: 32.1 pg (ref 26.0–34.0)
MCHC: 32.1 g/dL (ref 30.0–36.0)
MCV: 100 fL (ref 80.0–100.0)
Monocytes Absolute: 0.9 10*3/uL (ref 0.1–1.0)
Monocytes Relative: 7 %
Neutro Abs: 11.4 10*3/uL — ABNORMAL HIGH (ref 1.7–7.7)
Neutrophils Relative %: 81 %
Platelets: 177 10*3/uL (ref 150–400)
RBC: 4.08 MIL/uL — ABNORMAL LOW (ref 4.22–5.81)
RDW: 18.3 % — ABNORMAL HIGH (ref 11.5–15.5)
WBC: 14 10*3/uL — ABNORMAL HIGH (ref 4.0–10.5)
nRBC: 0 % (ref 0.0–0.2)

## 2022-10-29 LAB — TROPONIN I (HIGH SENSITIVITY)
Troponin I (High Sensitivity): 31 ng/L — ABNORMAL HIGH (ref <18)
Troponin I (High Sensitivity): 33 ng/L — ABNORMAL HIGH (ref <18)

## 2022-10-29 LAB — COMPREHENSIVE METABOLIC PANEL
ALT: 32 U/L (ref 0–44)
AST: 40 U/L (ref 15–41)
Albumin: 2.6 g/dL — ABNORMAL LOW (ref 3.5–5.0)
Alkaline Phosphatase: 69 U/L (ref 38–126)
Anion gap: 12 (ref 5–15)
BUN: 12 mg/dL (ref 8–23)
CO2: 21 mmol/L — ABNORMAL LOW (ref 22–32)
Calcium: 8.3 mg/dL — ABNORMAL LOW (ref 8.9–10.3)
Chloride: 102 mmol/L (ref 98–111)
Creatinine, Ser: 1.05 mg/dL (ref 0.61–1.24)
GFR, Estimated: >60 mL/min (ref >60)
Glucose, Bld: 178 mg/dL — ABNORMAL HIGH (ref 70–99)
Potassium: 3.6 mmol/L (ref 3.5–5.1)
Sodium: 135 mmol/L (ref 135–145)
Total Bilirubin: 0.8 mg/dL (ref 0.3–1.2)
Total Protein: 6.3 g/dL — ABNORMAL LOW (ref 6.5–8.1)

## 2022-10-29 LAB — PROCALCITONIN: Procalcitonin: 0.81 ng/mL

## 2022-10-29 LAB — BRAIN NATRIURETIC PEPTIDE: B Natriuretic Peptide: 2456.6 pg/mL — ABNORMAL HIGH (ref 0.0–100.0)

## 2022-10-29 LAB — MRSA NEXT GEN BY PCR, NASAL: MRSA by PCR Next Gen: NOT DETECTED

## 2022-10-29 LAB — SARS CORONAVIRUS 2 BY RT PCR: SARS Coronavirus 2 by RT PCR: NEGATIVE

## 2022-10-29 LAB — LACTIC ACID, PLASMA: Lactic Acid, Venous: 1.5 mmol/L (ref 0.5–1.9)

## 2022-10-29 LAB — PROTIME-INR
INR: 3.4 — ABNORMAL HIGH (ref 0.8–1.2)
Prothrombin Time: 34.7 seconds — ABNORMAL HIGH (ref 11.4–15.2)

## 2022-10-29 MED ORDER — ACETAMINOPHEN 325 MG PO TABS
650.0000 mg | ORAL_TABLET | Freq: Four times a day (QID) | ORAL | Status: DC | PRN
  Administered 2022-11-04: 650 mg via ORAL
  Filled 2022-10-29: qty 2

## 2022-10-29 MED ORDER — ACETAMINOPHEN 650 MG RE SUPP: 650.0000 mg | Freq: Four times a day (QID) | RECTAL | Status: DC | PRN

## 2022-10-29 MED ORDER — MELATONIN 5 MG PO TABS
5.0000 mg | ORAL_TABLET | Freq: Every day | ORAL | Status: DC
  Administered 2022-10-29 – 2022-11-05 (×7): 5 mg via ORAL
  Filled 2022-10-29 (×7): qty 1

## 2022-10-29 MED ORDER — LORAZEPAM 2 MG/ML IJ SOLN
0.5000 mg | Freq: Once | INTRAMUSCULAR | Status: AC
  Administered 2022-10-29: 0.5 mg via INTRAVENOUS
  Filled 2022-10-29: qty 1

## 2022-10-29 MED ORDER — AMIODARONE HCL 200 MG PO TABS
200.0000 mg | ORAL_TABLET | Freq: Two times a day (BID) | ORAL | Status: DC
  Administered 2022-10-29 – 2022-11-06 (×14): 200 mg via ORAL
  Filled 2022-10-29 (×15): qty 1

## 2022-10-29 MED ORDER — ATORVASTATIN CALCIUM 80 MG PO TABS
80.0000 mg | ORAL_TABLET | Freq: Every day | ORAL | Status: DC
  Administered 2022-10-29 – 2022-11-06 (×8): 80 mg via ORAL
  Filled 2022-10-29 (×9): qty 1

## 2022-10-29 MED ORDER — IPRATROPIUM-ALBUTEROL 0.5-2.5 (3) MG/3ML IN SOLN
3.0000 mL | Freq: Once | RESPIRATORY_TRACT | Status: AC
  Administered 2022-10-29: 3 mL via RESPIRATORY_TRACT
  Filled 2022-10-29: qty 3

## 2022-10-29 MED ORDER — CHLORHEXIDINE GLUCONATE CLOTH 2 % EX PADS
6.0000 | MEDICATED_PAD | Freq: Every day | CUTANEOUS | Status: DC
  Administered 2022-10-30 – 2022-11-06 (×8): 6 via TOPICAL

## 2022-10-29 MED ORDER — IOHEXOL 350 MG/ML SOLN
75.0000 mL | Freq: Once | INTRAVENOUS | Status: AC | PRN
  Administered 2022-10-29: 75 mL via INTRAVENOUS

## 2022-10-29 MED ORDER — PANTOPRAZOLE SODIUM 40 MG PO TBEC
40.0000 mg | DELAYED_RELEASE_TABLET | Freq: Every day | ORAL | Status: DC
  Administered 2022-10-30 – 2022-11-06 (×7): 40 mg via ORAL
  Filled 2022-10-29 (×8): qty 1

## 2022-10-29 MED ORDER — SODIUM CHLORIDE 0.9 % IV SOLN
500.0000 mg | Freq: Once | INTRAVENOUS | Status: AC
  Administered 2022-10-29: 500 mg via INTRAVENOUS
  Filled 2022-10-29: qty 5

## 2022-10-29 MED ORDER — FUROSEMIDE 10 MG/ML IJ SOLN
40.0000 mg | Freq: Once | INTRAMUSCULAR | Status: AC
  Administered 2022-10-29: 40 mg via INTRAVENOUS
  Filled 2022-10-29: qty 4

## 2022-10-29 MED ORDER — IPRATROPIUM-ALBUTEROL 0.5-2.5 (3) MG/3ML IN SOLN: 3.0000 mL | RESPIRATORY_TRACT | Status: DC | PRN

## 2022-10-29 MED ORDER — SODIUM CHLORIDE 0.9 % IV SOLN
2.0000 g | Freq: Once | INTRAVENOUS | Status: AC
  Administered 2022-10-29: 2 g via INTRAVENOUS
  Filled 2022-10-29: qty 20

## 2022-10-29 MED ORDER — IPRATROPIUM-ALBUTEROL 0.5-2.5 (3) MG/3ML IN SOLN
3.0000 mL | Freq: Four times a day (QID) | RESPIRATORY_TRACT | Status: DC
  Administered 2022-10-29 – 2022-10-30 (×4): 3 mL via RESPIRATORY_TRACT
  Filled 2022-10-29 (×4): qty 3

## 2022-10-29 MED ORDER — SENNOSIDES-DOCUSATE SODIUM 8.6-50 MG PO TABS
2.0000 | ORAL_TABLET | Freq: Every day | ORAL | Status: DC
  Administered 2022-10-29 – 2022-11-06 (×6): 2 via ORAL
  Filled 2022-10-29 (×9): qty 2

## 2022-10-29 MED ORDER — RIVAROXABAN 20 MG PO TABS
20.0000 mg | ORAL_TABLET | Freq: Every day | ORAL | Status: DC
  Administered 2022-10-29: 20 mg via ORAL
  Filled 2022-10-29: qty 1

## 2022-10-29 NOTE — Assessment & Plan Note (Addendum)
Patient with long-standing COPD, home regimen includes Dulera and rescue albuterol inhaler. Less likely COPD exacerbation given patient only meets 1 of 3 cardinal symptoms, no increase in cough or sputum production/quality. VBG not consistent with CO2 retention.  - Hold home Dulera  - DuoNebs q6h scheduled

## 2022-10-29 NOTE — ED Notes (Signed)
ED TO INPATIENT HANDOFF REPORT  ED Nurse Name and Phone #: [phone]  S Name/Age/Gender Benjamin Cook 77 y.o. male Room/Bed: 022C/022C  Code Status   Code Status: Full Code  Home/SNF/Other Home Patient oriented to: self, place, time, and situation Is this baseline? Yes   Triage Complete: Triage complete  Chief Complaint Acute hypoxic respiratory failure ([name]) [J96.01]  Triage Note Pt was BIB [facility]  from home. EMS reported patient has been having shortness ofd breath of two months. He does have a history of lung cancer. Patient was in the 80's on room air and was initially placed on nonrebreather. After the nonrebreather was placed his saturation went in the lower 90's. EMS said patient had wheezing in the upper lobe and diminish in the lower. He was given a Duoneb neb at 0740 and saturation went to 95 to 97 while on the nonrebreather. He was given solumedrol 125 at 0745 and 2 gram of magnesium at 0748. Vital signs: BP: 180/90, HR 100 and patient has a pacemaker. He is A & O x 4.   Allergies Allergies  Allergen Reactions   Paclitaxel Other (See Comments)    Had chest pain after completion of chemotherapy on 08/12/2022    Level of Care/Admitting Diagnosis ED Disposition     ED Disposition  Admit   Condition  --   Comment  Hospital Area: [facility] [[id number]]  Level of Care: Progressive [102]  Admit to Progressive based on following criteria: RESPIRATORY PROBLEMS hypoxemic/hypercapnic respiratory failure that is responsive to NIPPV (BiPAP) or High Flow Nasal Cannula (6-80 lpm). Frequent assessment/intervention, no > Q2 hrs < Q4 hrs, to maintain oxygenation and pulmonary hygiene.  May admit patient to [facility] or [facility] if equivalent level of care is available:: No  Covid Evaluation: Asymptomatic - no recent exposure (last 10 days) testing not required  Diagnosis: Acute hypoxic respiratory failure [facility] [[id number]]  Admitting Physician: Lincoln Brigham [[id number]]  Attending Physician: Westley Chandler [[id number]]  Certification:: I certify this patient will need inpatient services for at least 2 midnights  Expected Medical Readiness: 11/01/2022          B Medical/Surgery History Past Medical History:  Diagnosis Date   AAA (abdominal aortic aneurysm) ([name])    Adrenal mass ([name])    per pt this is remote (10 years) and benign by biopsy   AICD (automatic cardioverter/defibrillator) present    Anxiety    Arthritis    CAD (coronary artery disease)    a. s/p PCI to LAD 2001 b. myoview 2014 high risk with scar LAD/RCA territory but no ischemia   Cardiomyopathy, ischemic    CHF (congestive heart failure) ([name])    class II/III   COPD (chronic obstructive pulmonary disease) ([name])    smokes cigars but has quit cigarettes   Defibrillator discharge 04/21/2019   Diverticulitis    Dyspnea    Flu 03/2015   HTN (hypertension)    Hyperlipidemia    NSVT (nonsustained ventricular tachycardia) ([name]) 03/05/2017   Paroxysmal atrial fibrillation ([name]) 03/2015   chads2vasc score is 4   PSA (psoriatic arthritis) ([name])    increased   PVD (peripheral vascular disease) ([name])    Past Surgical History:  Procedure Laterality Date   ABDOMINAL AORTIC ENDOVASCULAR STENT GRAFT N/A 03/03/2017   Procedure: ABDOMINAL AORTIC ENDOVASCULAR STENT GRAFT;  Surgeon: Maeola Harman, MD;  Location: [facility] OR;  Service: Vascular;  Laterality: N/A;   BRONCHIAL NEEDLE ASPIRATION BIOPSY  06/20/2022  Procedure: BRONCHIAL NEEDLE ASPIRATION BIOPSIES;  Surgeon: Omar Person, MD;  Location: [facility] ENDOSCOPY;  Service: Pulmonary;;   cataract surgery     CORONARY ANGIOGRAPHY N/A 10/12/2020   Procedure: CORONARY ANGIOGRAPHY;  Surgeon: Runell Gess, MD;  Location: [facility] INVASIVE CV LAB;  Service: Cardiovascular;  Laterality: N/A;   CORONARY ANGIOPLASTY WITH STENT PLACEMENT  2001   a. PCI to LAD   IMPLANTABLE CARDIOVERTER DEFIBRILLATOR (ICD) GENERATOR CHANGE N/A  03/18/2014   a. SJM Fortify ST DR ICD implanted by [facility] for primary prevention b. gen change 03/2014    IR IMAGING GUIDED PORT INSERTION  07/09/2022   LEAD REVISION/REPAIR N/A 07/17/2016   Procedure: Lead Revision/Repair;  Surgeon: Marinus Maw, MD;  Location: [facility] INVASIVE CV LAB;  Service: Cardiovascular;  Laterality: N/A;   LEFT HEART CATHETERIZATION WITH CORONARY ANGIOGRAM N/A 05/27/2014   Procedure: LEFT HEART CATHETERIZATION WITH CORONARY ANGIOGRAM;  Surgeon: Tonny Bollman, MD;  Location: [facility] C[facility] LAB;  Service: Cardiovascular;  Laterality: N/A;   REPAIR KNEE LIGAMENT     V TACH ABLATION N/A 10/26/2020   Procedure: V TACH ABLATION;  Surgeon: Marinus Maw, MD;  Location: [facility] INVASIVE CV LAB;  Service: Cardiovascular;  Laterality: N/A;   VIDEO BRONCHOSCOPY WITH ENDOBRONCHIAL ULTRASOUND N/A 06/20/2022   Procedure: VIDEO BRONCHOSCOPY WITH ENDOBRONCHIAL ULTRASOUND;  Surgeon: Omar Person, MD;  Location: [facility] ENDOSCOPY;  Service: Pulmonary;  Laterality: N/A;     A IV Location/Drains/Wounds Patient Lines/Drains/Airways Status     Active Line/Drains/Airways     Name Placement date Placement time Site Days   Implanted Port 07/09/22 Right Chest 07/09/22  1358  Chest  112            Intake/Output Last 24 hours No intake or output data in the 24 hours ending 10/29/22 1556  Labs/Imaging Results for orders placed or performed during the hospital encounter of 10/29/22 (from the past 48 hour(s))  Comprehensive metabolic panel     Status: Abnormal   Collection Time: 10/29/22  9:24 AM  Result Value Ref Range   Sodium 135 135 - 145 mmol/L   Potassium 3.6 3.5 - 5.1 mmol/L   Chloride 102 98 - 111 mmol/L   CO2 21 (L) 22 - 32 mmol/L   Glucose, Bld 178 (H) 70 - 99 mg/dL    Comment: Glucose reference range applies only to samples taken after fasting for at least 8 hours.   BUN 12 8 - 23 mg/dL   Creatinine, Ser 1.61 0.61 - 1.24 mg/dL   Calcium 8.3 (L) 8.9 - 10.3 mg/dL   Total Protein 6.3 (L)  6.5 - 8.1 g/dL   Albumin 2.6 (L) 3.5 - 5.0 g/dL   AST 40 15 - 41 U/L   ALT 32 0 - 44 U/L   Alkaline Phosphatase 69 38 - 126 U/L   Total Bilirubin 0.8 0.3 - 1.2 mg/dL   GFR, Estimated >09 >60 mL/min    Comment: (NOTE) Calculated using the CKD-EPI Creatinine Equation (2021)    Anion gap 12 5 - 15    Comment: Performed at [facility] Lab, [address] [place], [place] [address]  CBC with Differential     Status: Abnormal   Collection Time: 10/29/22  9:24 AM  Result Value Ref Range   WBC 14.0 (H) 4.0 - 10.5 K/uL   RBC 4.08 (L) 4.22 - 5.81 MIL/uL   Hemoglobin 13.1 13.0 - 17.0 g/dL   HCT 81.1 91.4 - 78.2 %   MCV 100.0  80.0 - 100.0 fL   MCH 32.1 26.0 - 34.0 pg   MCHC 32.1 30.0 - 36.0 g/dL   RDW 01.0 (H) 27.2 - 53.6 %   Platelets 177 150 - 400 K/uL   nRBC 0.0 0.0 - 0.2 %   Neutrophils Relative % 81 %   Neutro Abs 11.4 (H) 1.7 - 7.7 K/uL   Lymphocytes Relative 10 %   Lymphs Abs 1.4 0.7 - 4.0 K/uL   Monocytes Relative 7 %   Monocytes Absolute 0.9 0.1 - 1.0 K/uL   Eosinophils Relative 1 %   Eosinophils Absolute 0.1 0.0 - 0.5 K/uL   Basophils Relative 0 %   Basophils Absolute 0.0 0.0 - 0.1 K/uL   Immature Granulocytes 1 %   Abs Immature Granulocytes 0.08 (H) 0.00 - 0.07 K/uL    Comment: Performed at [facility], [address], [place], [place] [address]  Troponin I (High Sensitivity)     Status: Abnormal   Collection Time: 10/29/22  9:24 AM  Result Value Ref Range   Troponin I (High Sensitivity) 31 (H) <18 ng/L    Comment: (NOTE) Elevated high sensitivity troponin I (hsTnI) values and significant  changes across serial measurements may suggest ACS but many other  chronic and acute conditions are known to elevate hsTnI results.  Refer to the "Links" section for chest pain algorithms and additional  guidance. Performed at [facility], [address], [place], [place] [address]   Brain natriuretic peptide     Status: Abnormal   Collection Time: 10/29/22  9:24  AM  Result Value Ref Range   B Natriuretic Peptide 2,456.6 (H) 0.0 - 100.0 pg/mL    Comment: Performed at [facility], [address] [place], [place] [address]  Protime-INR     Status: Abnormal   Collection Time: 10/29/22  9:24 AM  Result Value Ref Range   Prothrombin Time 34.7 (H) 11.4 - 15.2 seconds   INR 3.4 (H) 0.8 - 1.2    Comment: (NOTE) INR goal varies based on device and disease states. Performed at [facility], [address], [place], [place] [address]   I-Stat venous blood gas, ED     Status: Abnormal   Collection Time: 10/29/22  9:36 AM  Result Value Ref Range   pH, Ven 7.333 7.25 - 7.43   pCO2, Ven 43.6 (L) 44 - 60 mmHg   pO2, Ven 40 32 - 45 mmHg   Bicarbonate 23.2 20.0 - 28.0 mmol/L   TCO2 24 22 - 32 mmol/L   O2 Saturation 72 %   Acid-base deficit 3.0 (H) 0.0 - 2.0 mmol/L   Sodium 140 135 - 145 mmol/L   Potassium 3.7 3.5 - 5.1 mmol/L   Calcium, Ion 1.16 1.15 - 1.40 mmol/L   HCT 41.0 39.0 - 52.0 %   Hemoglobin 13.9 13.0 - 17.0 g/dL   Sample type VENOUS   Troponin I (High Sensitivity)     Status: Abnormal   Collection Time: 10/29/22 10:40 AM  Result Value Ref Range   Troponin I (High Sensitivity) 33 (H) <18 ng/L    Comment: (NOTE) Elevated high sensitivity troponin I (hsTnI) values and significant  changes across serial measurements may suggest ACS but many other  chronic and acute conditions are known to elevate hsTnI results.  Refer to the "Links" section for chest pain algorithms and additional  guidance. Performed at [facility], [address], [place], [place] [address]  Respiratory (~20 pathogens) panel by PCR     Status: None   Collection Time: 10/29/22  1:00 PM   Specimen: Nasopharyngeal Swab; Respiratory  Result Value Ref Range   Adenovirus NOT DETECTED NOT DETECTED   Coronavirus 229E NOT DETECTED NOT DETECTED    Comment: (NOTE) The Coronavirus on the Respiratory Panel, DOES NOT test for the novel  Coronavirus (2019  nCoV)    Coronavirus HKU1 NOT DETECTED NOT DETECTED   Coronavirus NL63 NOT DETECTED NOT DETECTED   Coronavirus OC43 NOT DETECTED NOT DETECTED   Metapneumovirus NOT DETECTED NOT DETECTED   Rhinovirus / Enterovirus NOT DETECTED NOT DETECTED   Influenza A NOT DETECTED NOT DETECTED   Influenza B NOT DETECTED NOT DETECTED   Parainfluenza Virus 1 NOT DETECTED NOT DETECTED   Parainfluenza Virus 2 NOT DETECTED NOT DETECTED   Parainfluenza Virus 3 NOT DETECTED NOT DETECTED   Parainfluenza Virus 4 NOT DETECTED NOT DETECTED   Respiratory Syncytial Virus NOT DETECTED NOT DETECTED   Bordetella pertussis NOT DETECTED NOT DETECTED   Bordetella Parapertussis NOT DETECTED NOT DETECTED   Chlamydophila pneumoniae NOT DETECTED NOT DETECTED   Mycoplasma pneumoniae NOT DETECTED NOT DETECTED    Comment: Performed at [facility], [address] [place], [place] [address]  SARS Coronavirus 2 by RT PCR (hospital order, performed in [facility] hospital lab) *cepheid single result test* Anterior Nasal Swab     Status: None   Collection Time: 10/29/22  1:00 PM   Specimen: Anterior Nasal Swab  Result Value Ref Range   SARS Coronavirus 2 by RT PCR NEGATIVE NEGATIVE    Comment: Performed at [facility], [address] [place], [place] [address]   DG Chest Port 1 View  Result Date: 10/29/2022 CLINICAL DATA:  Cough.  Shortness of breath. EXAM: PORTABLE CHEST 1 VIEW COMPARISON:  06/20/2022. FINDINGS: There is a large triangular right hilar opacity, compatible with patient's known malignancy. There is significant interval increase since the prior radiograph from 06/20/2022 and mildly increased since the PET-CT scan from 09/26/2022. There is small right pleural effusion as well. Bilateral lungs otherwise exhibit increased interstitial markings, concerning for superimposed pulmonary edema. Left lung is grossly clear. Left lateral costophrenic angle is clear. Stable cardio-mediastinal silhouette. There is a  left sided 3-lead pacemaker. Right-sided CT Port-A-Cath is seen with its tip overlying the cavoatrial junction region. No acute osseous abnormalities. The soft tissues are within normal limits. IMPRESSION: 1. Large right hilar mass, compatible with patient's known malignancy. 2. Small right pleural effusion. 3. Increased interstitial markings, concerning for superimposed pulmonary edema. Electronically Signed   By: Jules Schick M.D.   On: 10/29/2022 11:45    Pending Labs Unresulted Labs (From admission, onward)     Start     Ordered   10/29/22 1405  Lactic acid, plasma  (Lactic Acid)  ONCE - STAT,   STAT        10/29/22 1404   10/29/22 1302  MRSA Next Gen by PCR, Nasal  Once,   R        10/29/22 1301   10/29/22 1258  Procalcitonin  Once,   R       References:    Procalcitonin Lower Respiratory Tract Infection AND Sepsis Procalcitonin Algorithm   10/29/22 1300   10/29/22 0955  Culture, blood (routine x 2)  BLOOD CULTURE X 2,   R (with STAT occurrences)      10/29/22 6440  Vitals/Pain Today's Vitals   10/29/22 1330 10/29/22 1400 10/29/22 1430 10/29/22 1500  BP: 116/61 116/66 135/66 120/65  Pulse: 67 69 69 72  Resp: (!) 24 (!) 30 (!) 27 (!) 28  Temp:      TempSrc:      SpO2: 92% 92% 94% 93%  PainSc:        Isolation Precautions Airborne and Contact precautions  Medications Medications  acetaminophen (TYLENOL) tablet 650 mg (has no administration in time range)    Or  acetaminophen (TYLENOL) suppository 650 mg (has no administration in time range)  amiodarone (PACERONE) tablet 200 mg (has no administration in time range)  melatonin tablet 5 mg (has no administration in time range)  pantoprazole (PROTONIX) EC tablet 40 mg (has no administration in time range)  senna-docusate (Senokot-S) tablet 2 tablet (2 tablets Oral Given 10/29/22 1548)  rivaroxaban (XARELTO) tablet 20 mg (has no administration in time range)  ipratropium-albuterol (DUONEB) 0.5-2.5 (3) MG/3ML  nebulizer solution 3 mL (3 mLs Nebulization Given 10/29/22 1550)  ipratropium-albuterol (DUONEB) 0.5-2.5 (3) MG/3ML nebulizer solution 3 mL (3 mLs Nebulization Given 10/29/22 0846)  LORazepam (ATIVAN) injection 0.5 mg (0.5 mg Intravenous Given 10/29/22 0943)  cefTRIAXone (ROCEPHIN) 2 g in sodium chloride 0.9 % 100 mL IVPB (0 g Intravenous Stopped 10/29/22 1112)  azithromycin (ZITHROMAX) 500 mg in sodium chloride 0.9 % 250 mL IVPB (0 mg Intravenous Stopped 10/29/22 1215)  furosemide (LASIX) injection 40 mg (40 mg Intravenous Given 10/29/22 1113)    Mobility walks     Focused Assessments Pulmonary Assessment Handoff:  Lung sounds: Bilateral Breath Sounds: Clear, Diminished L Breath Sounds: Diminished, Rhonchi R Breath Sounds: Rhonchi, Diminished O2 Device: Venturi Mask O2 Flow Rate (L/min): 12 L/min    R Recommendations: See Admitting Provider Note  Report given to:   Additional Notes: does not use a walker, patient is weak. He was on a Bipap, but now he is on a venti mask at 50% satting 93 %

## 2022-10-29 NOTE — ED Provider Notes (Signed)
[facility] EMERGENCY DEPARTMENT AT [facility] Provider Note  CSN: [id number] Arrival date & time: 10/29/22 0272  Chief Complaint(s) Shortness of Breath  HPI Benjamin Cook is a 77 y.o. male history of CHF, COPD, coronary artery disease, hypertension, A-fib on Eliquis presenting to the emergency department shortness of breath.  Benjamin Cook reports shortness of breath over the past 2 to 3 weeks, reports that this has been slowly worsening.  No sudden worsening.  No chest pain.  Reports cough, fevers and chills.  No nausea or vomiting.  No abdominal pain.  No back pain.  No leg swelling.  Paramedics found Benjamin Cook to be hypoxic in the 80s on room air.   Past Medical History Past Medical History:  Diagnosis Date   AAA (abdominal aortic aneurysm) ([name])    Adrenal mass ([name])    per pt this is remote (10 years) and benign by biopsy   AICD (automatic cardioverter/defibrillator) present    Anxiety    Arthritis    CAD (coronary artery disease)    a. s/p PCI to LAD 2001 b. myoview 2014 high risk with scar LAD/RCA territory but no ischemia   Cardiomyopathy, ischemic    CHF (congestive heart failure) ([name])    class II/III   COPD (chronic obstructive pulmonary disease) ([name])    smokes cigars but has quit cigarettes   Defibrillator discharge 04/21/2019   Diverticulitis    Dyspnea    Flu 03/2015   HTN (hypertension)    Hyperlipidemia    NSVT (nonsustained ventricular tachycardia) ([name]) 03/05/2017   Paroxysmal atrial fibrillation ([name]) 03/2015   chads2vasc score is 4   PSA (psoriatic arthritis) ([name])    increased   PVD (peripheral vascular disease) ([name])    Benjamin Cook Active Problem List   Diagnosis Date Noted   Acute allergic reaction 08/12/2022   Drug-induced hypotension 07/29/2022   Pancytopenia, acquired ([name]) 07/29/2022   Other constipation 07/12/2022   Non-small cell lung cancer ([name]) 06/11/2022   Lymphangitis 06/07/2022   Ventricular tachyarrhythmia ([name]) 06/03/2022    Hilar mass 10/14/2021   Chronic combined systolic and diastolic heart failure ([name]) 10/14/2021   Anemia of chronic disease 10/14/2021   Generalized weakness 10/13/2021   V tach ([name]) 10/11/2020   Abdominal pain 10/11/2020   Ventricular tachycardia ([name]) 10/11/2020   Malnutrition of moderate degree 06/04/2020   Defibrillator discharge 04/22/2019   Goals of care, counseling/discussion 08/08/2017   Anemia, iron deficiency 08/08/2017   NSVT (nonsustained ventricular tachycardia) ([name]) 03/05/2017   AAA (abdominal aortic aneurysm) ([name]) 03/03/2017   Low back pain 01/01/2017   Adrenal mass ([name]) 11/24/2016   ICD (implantable cardioverter-defibrillator) lead failure 07/16/2016   Paroxysmal atrial fibrillation ([name]) 06/26/2015   Dyspnea 04/10/2015   Acute respiratory failure ([name]) 03/28/2015   Essential hypertension 03/28/2015   Systolic CHF ([name]) 03/28/2015   Pyrexia    Shortness of breath    ICD (implantable cardioverter-defibrillator) in place 06/08/2010   Chronic systolic CHF (congestive heart failure) ([name]) 04/18/2010   WEIGHT LOSS 11/01/2008   TOBACCO ABUSE 09/06/2008   Aneurysm of abdominal vessel (5.6 CM) 09/06/2008   HLD (hyperlipidemia) 09/05/2008   COUGH 04/08/2007   PSA, INCREASED 04/08/2007   OTHER ABNORMAL BLOOD CHEMISTRY 03/24/2007   Adrenal nodule ([name]) 10/01/2006   Anxiety state 10/01/2006   HYPERTENSION 10/01/2006   CAD (coronary artery disease) 10/01/2006   Cardiomyopathy, ischemic 10/01/2006   COPD, severe ([name]) 10/01/2006   HYPERGLYCEMIA 10/01/2006   HEMATURIA, HX OF 10/01/2006   Home Medication(s) Prior  to Admission medications   Medication Sig Start Date End Date Taking? Authorizing Provider  furosemide (LASIX) 40 MG tablet TAKE 1/2 TABLET BY MOUTH EVERY DAY 08/06/22   Marinus Maw, MD  acetaminophen (TYLENOL) 500 MG tablet Take 500-1,000 mg by mouth every 8 (eight) hours as needed (for pain).     [provider]  amiodarone (PACERONE) 400 MG  tablet Take 0.5 tablets (200 mg total) by mouth 2 (two) times daily. 06/08/22   Danford, Earl Lites, MD  atorvastatin (LIPITOR) 80 MG tablet TAKE 1 TABLET BY MOUTH DAILY AT 6 PM Benjamin Cook taking differently: Take 80 mg by mouth daily. TAKE 1 TABLET BY MOUTH  DAILY AT 6 PM 11/23/21   [name], MD  melatonin 5 MG TABS Take 1 tablet (5 mg total) by mouth at bedtime. 10/16/20   Marinda Elk, MD  metoprolol succinate (TOPROL-XL) 50 MG 24 hr tablet Take 1 tablet (50 mg total) by mouth 2 (two) times daily. Take with or immediately following a meal. 08/19/22   Artis Delay, MD  mometasone-formoterol (DULERA) 100-5 MCG/ACT AERO Inhale 2 puffs into the lungs in the morning and at bedtime. 01/31/22   Allred, Fayrene Fearing, MD  Multiple Vitamin (MULTIVITAMIN) capsule Take 1 capsule by mouth daily.    [provider]  ondansetron (ZOFRAN) 8 MG tablet Take 1 tablet (8 mg total) by mouth every 8 (eight) hours as needed for nausea. 07/12/22   Artis Delay, MD  pantoprazole (PROTONIX) 40 MG tablet Take 40 mg by mouth daily. 06/07/22   [provider]  rivaroxaban (XARELTO) 20 MG TABS tablet Take 1 tablet (20 mg total) by mouth daily with supper. 07/26/22   [name], MD  sacubitril-valsartan (ENTRESTO) 24-26 MG Take 1 tablet by mouth 2 (two) times daily. 07/30/22 07/30/23  [name], MD  senna-docusate (SENOKOT-S) 8.6-50 MG tablet Take 2 tablets by mouth daily. 07/12/22   Artis Delay, MD  spironolactone (ALDACTONE) 25 MG tablet Take 1 tablet (25 mg total) by mouth daily. 06/08/22   Alberteen Sam, MD                                                                                                                                    Past Surgical History Past Surgical History:  Procedure Laterality Date   ABDOMINAL AORTIC ENDOVASCULAR STENT GRAFT N/A 03/03/2017   Procedure: ABDOMINAL AORTIC ENDOVASCULAR STENT GRAFT;  Surgeon: Maeola Harman, MD;  Location: [facility] OR;  Service:  Vascular;  Laterality: N/A;   BRONCHIAL NEEDLE ASPIRATION BIOPSY  06/20/2022   Procedure: BRONCHIAL NEEDLE ASPIRATION BIOPSIES;  Surgeon: Omar Person, MD;  Location: [facility] ENDOSCOPY;  Service: Pulmonary;;   cataract surgery     CORONARY ANGIOGRAPHY N/A 10/12/2020   Procedure: CORONARY ANGIOGRAPHY;  Surgeon: Runell Gess, MD;  Location: [facility] INVASIVE CV LAB;  Service: Cardiovascular;  Laterality: N/A;   CORONARY ANGIOPLASTY WITH STENT  PLACEMENT  2001   a. PCI to LAD   IMPLANTABLE CARDIOVERTER DEFIBRILLATOR (ICD) GENERATOR CHANGE N/A 03/18/2014   a. SJM Fortify ST DR ICD implanted by [facility] for primary prevention b. gen change 03/2014    IR IMAGING GUIDED PORT INSERTION  07/09/2022   LEAD REVISION/REPAIR N/A 07/17/2016   Procedure: Lead Revision/Repair;  Surgeon: Marinus Maw, MD;  Location: [facility] INVASIVE CV LAB;  Service: Cardiovascular;  Laterality: N/A;   LEFT HEART CATHETERIZATION WITH CORONARY ANGIOGRAM N/A 05/27/2014   Procedure: LEFT HEART CATHETERIZATION WITH CORONARY ANGIOGRAM;  Surgeon: Tonny Bollman, MD;  Location: [facility] CATH LAB;  Service: Cardiovascular;  Laterality: N/A;   REPAIR KNEE LIGAMENT     V TACH ABLATION N/A 10/26/2020   Procedure: V TACH ABLATION;  Surgeon: Marinus Maw, MD;  Location: [facility] INVASIVE CV LAB;  Service: Cardiovascular;  Laterality: N/A;   VIDEO BRONCHOSCOPY WITH ENDOBRONCHIAL ULTRASOUND N/A 06/20/2022   Procedure: VIDEO BRONCHOSCOPY WITH ENDOBRONCHIAL ULTRASOUND;  Surgeon: Omar Person, MD;  Location: [facility] ENDOSCOPY;  Service: Pulmonary;  Laterality: N/A;   Family History Family History  Problem Relation Age of Onset   Cirrhosis Mother        due to ETOH   Cancer Neg Hx     Social History Social History   Tobacco Use   Smoking status: Some Days    Types: Cigars    Last attempt to quit: 2020    Years since quitting: 4.7   Smokeless tobacco: Never   Tobacco comments:    smokes cigars now, no longer smokes cigarettes but previously smoked 2ppd   Vaping Use   Vaping status: Never Used  Substance Use Topics   Alcohol use: No   Drug use: No   Allergies Paclitaxel  Review of Systems Review of Systems  All other systems reviewed and are negative.   Physical Exam Vital Signs  I have reviewed the triage vital signs BP 114/70   Pulse 83   Resp (!) 25   SpO2 100%  Physical Exam Vitals and nursing note reviewed.  Constitutional:      General: He is in acute distress.     Appearance: Normal appearance. He is ill-appearing and diaphoretic.  HENT:     Mouth/Throat:     Mouth: Mucous membranes are moist.  Eyes:     Conjunctiva/sclera: Conjunctivae normal.  Cardiovascular:     Rate and Rhythm: Normal rate and regular rhythm.  Pulmonary:     Effort: Tachypnea and respiratory distress present.     Breath sounds: Examination of the right-upper field reveals rhonchi. Examination of the left-upper field reveals rhonchi. Examination of the right-middle field reveals rhonchi. Examination of the left-middle field reveals rhonchi. Examination of the right-lower field reveals rhonchi and rales. Examination of the left-lower field reveals rhonchi and rales. Rhonchi and rales present.  Abdominal:     General: Abdomen is flat.     Palpations: Abdomen is soft.     Tenderness: There is no abdominal tenderness.  Musculoskeletal:     Right lower leg: No edema.     Left lower leg: No edema.  Skin:    General: Skin is warm.     Capillary Refill: Capillary refill takes less than 2 seconds.  Neurological:     Mental Status: He is alert and oriented to person, place, and time. Mental status is at baseline.  Psychiatric:        Mood and Affect: Mood normal.  Behavior: Behavior normal.     ED Results and Treatments Labs (all labs ordered are listed, but only abnormal results are displayed) Labs Reviewed  COMPREHENSIVE METABOLIC PANEL - Abnormal; Notable for the following components:      Result Value   CO2 21 (*)    Glucose,  Bld 178 (*)    Calcium 8.3 (*)    Total Protein 6.3 (*)    Albumin 2.6 (*)    All other components within normal limits  CBC WITH DIFFERENTIAL/PLATELET - Abnormal; Notable for the following components:   WBC 14.0 (*)    RBC 4.08 (*)    RDW 18.3 (*)    Neutro Abs 11.4 (*)    Abs Immature Granulocytes 0.08 (*)    All other components within normal limits  BRAIN NATRIURETIC PEPTIDE - Abnormal; Notable for the following components:   B Natriuretic Peptide 2,456.6 (*)    All other components within normal limits  PROTIME-INR - Abnormal; Notable for the following components:   Prothrombin Time 34.7 (*)    INR 3.4 (*)    All other components within normal limits  I-STAT VENOUS BLOOD GAS, ED - Abnormal; Notable for the following components:   pCO2, Ven 43.6 (*)    Acid-base deficit 3.0 (*)    All other components within normal limits  TROPONIN I (HIGH SENSITIVITY) - Abnormal; Notable for the following components:   Troponin I (High Sensitivity) 31 (*)    All other components within normal limits  TROPONIN I (HIGH SENSITIVITY) - Abnormal; Notable for the following components:   Troponin I (High Sensitivity) 33 (*)    All other components within normal limits  CULTURE, BLOOD (ROUTINE X 2)  CULTURE, BLOOD (ROUTINE X 2)                                                                                                                          Radiology DG Chest Port 1 View  Result Date: 10/29/2022 CLINICAL DATA:  Cough.  Shortness of breath. EXAM: PORTABLE CHEST 1 VIEW COMPARISON:  06/20/2022. FINDINGS: There is a large triangular right hilar opacity, compatible with Benjamin Cook's known malignancy. There is significant interval increase since the prior radiograph from 06/20/2022 and mildly increased since the PET-CT scan from 09/26/2022. There is small right pleural effusion as well. Bilateral lungs otherwise exhibit increased interstitial markings, concerning for superimposed pulmonary edema. Left  lung is grossly clear. Left lateral costophrenic angle is clear. Stable cardio-mediastinal silhouette. There is a left sided 3-lead pacemaker. Right-sided CT Port-A-Cath is seen with its tip overlying the cavoatrial junction region. No acute osseous abnormalities. The soft tissues are within normal limits. IMPRESSION: 1. Large right hilar mass, compatible with Benjamin Cook's known malignancy. 2. Small right pleural effusion. 3. Increased interstitial markings, concerning for superimposed pulmonary edema. Electronically Signed   By: Jules Schick M.D.   On: 10/29/2022 11:45    Pertinent labs & imaging results that were available during my care of the  Benjamin Cook were reviewed by me and considered in my medical decision making (see MDM for details).  Medications Ordered in ED Medications  azithromycin (ZITHROMAX) 500 mg in sodium chloride 0.9 % 250 mL IVPB (500 mg Intravenous New Bag/Given 10/29/22 1115)  ipratropium-albuterol (DUONEB) 0.5-2.5 (3) MG/3ML nebulizer solution 3 mL (3 mLs Nebulization Given 10/29/22 0846)  LORazepam (ATIVAN) injection 0.5 mg (0.5 mg Intravenous Given 10/29/22 0943)  cefTRIAXone (ROCEPHIN) 2 g in sodium chloride 0.9 % 100 mL IVPB (0 g Intravenous Stopped 10/29/22 1112)  furosemide (LASIX) injection 40 mg (40 mg Intravenous Given 10/29/22 1113)                                                                                                                                     Procedures .Critical Care  Performed by: Lonell Grandchild, MD Authorized by: Lonell Grandchild, MD   Critical care provider statement:    Critical care time (minutes):  30   Critical care was necessary to treat or prevent imminent or life-threatening deterioration of the following conditions:  Respiratory failure   Critical care was time spent personally by me on the following activities:  Development of treatment plan with Benjamin Cook or surrogate, discussions with consultants, evaluation of Benjamin Cook's response  to treatment, examination of Benjamin Cook, ordering and review of laboratory studies, ordering and review of radiographic studies, ordering and performing treatments and interventions, pulse oximetry, re-evaluation of Benjamin Cook's condition and review of old charts   Care discussed with: admitting provider     (including critical care time)  Medical Decision Making / ED Course   MDM:  77 year old presenting to the emergency department shortness of breath.  Benjamin Cook in acute distress, with tachypnea.  Placed on BiPAP.  No leg swelling swelling but Benjamin Cook diaphoretic.  EKG with intraventricular conduction delay.  Has some inferior elevation likely due to intraventricular conduction delay, out of an abundance of caution, discussed with on-call STEMI doctor Dr. [place] who does not feel this is STEMI.  Benjamin Cook is not having chest pain.  Differential includes COPD exacerbation, Benjamin Cook is already received DuoNeb and steroids, magnesium from paramedics, will try additional DuoNeb.  Also suspect possible pneumonia, given rhonchi.  Will check chest x-ray.  Differential also includes CHF exacerbation although no significant lower extremity swelling.  Lower concern for PE, Benjamin Cook is on chronic anticoagulation and no signs of PE such as leg swelling or tenderness.  Less likely pneumothorax but will check chest x-ray.  Will reassess.  Given work of breathing may need ICU admission.    Clinical Course as of 10/29/22 1155  Tue Oct 29, 2022  1153 Signed out to the family medicine team who admit the Benjamin Cook.  BNP significantly elevated.  Chest x-ray shows some findings of possible hypervolemia.  Chest x-ray also with known hilar mass, on my initial interpretation is that this could be a pneumonia so Benjamin Cook was covered with antibiotics as well.  May need chest CT to further clarify.  Will be admitted for further workup. [WS]    Clinical Course User Index [WS] Lonell Grandchild, MD     Additional history  obtained: -Additional history obtained from Cook and spouse -External records from outside source obtained and reviewed including: Chart review including previous notes, labs, imaging, consultation notes including prior oncology notes   Lab Tests: -I ordered, reviewed, and interpreted labs.   The pertinent results include:   Labs Reviewed  COMPREHENSIVE METABOLIC PANEL - Abnormal; Notable for the following components:      Result Value   CO2 21 (*)    Glucose, Bld 178 (*)    Calcium 8.3 (*)    Total Protein 6.3 (*)    Albumin 2.6 (*)    All other components within normal limits  CBC WITH DIFFERENTIAL/PLATELET - Abnormal; Notable for the following components:   WBC 14.0 (*)    RBC 4.08 (*)    RDW 18.3 (*)    Neutro Abs 11.4 (*)    Abs Immature Granulocytes 0.08 (*)    All other components within normal limits  BRAIN NATRIURETIC PEPTIDE - Abnormal; Notable for the following components:   B Natriuretic Peptide 2,456.6 (*)    All other components within normal limits  PROTIME-INR - Abnormal; Notable for the following components:   Prothrombin Time 34.7 (*)    INR 3.4 (*)    All other components within normal limits  I-STAT VENOUS BLOOD GAS, ED - Abnormal; Notable for the following components:   pCO2, Ven 43.6 (*)    Acid-base deficit 3.0 (*)    All other components within normal limits  TROPONIN I (HIGH SENSITIVITY) - Abnormal; Notable for the following components:   Troponin I (High Sensitivity) 31 (*)    All other components within normal limits  TROPONIN I (HIGH SENSITIVITY) - Abnormal; Notable for the following components:   Troponin I (High Sensitivity) 33 (*)    All other components within normal limits  CULTURE, BLOOD (ROUTINE X 2)  CULTURE, BLOOD (ROUTINE X 2)    Notable for elevated BNP, mild leukocytosis, mild troponin elevation which is stable on re-check   EKG   EKG Interpretation Date/Time:  Tuesday October 29 2022 08:37:56 EDT Ventricular Rate:  100 PR  Interval:  80 QRS Duration:  186 QT Interval:  443 QTC Calculation: 572 R Axis:   -65  Text Interpretation: Sinus tachycardia Non-specific intra-ventricular conduction delay Confirmed by Alvino Blood ([id number]) on 10/29/2022 8:45:29 AM         Imaging Studies ordered: I ordered imaging studies including CXR On my interpretation imaging demonstrates hilar mass I independently visualized and interpreted imaging. I agree with the radiologist interpretation   Medicines ordered and prescription drug management: Meds ordered this encounter  Medications   ipratropium-albuterol (DUONEB) 0.5-2.5 (3) MG/3ML nebulizer solution 3 mL   LORazepam (ATIVAN) injection 0.5 mg   cefTRIAXone (ROCEPHIN) 2 g in sodium chloride 0.9 % 100 mL IVPB    Order Specific Question:   Antibiotic Indication:    Answer:   CAP   azithromycin (ZITHROMAX) 500 mg in sodium chloride 0.9 % 250 mL IVPB   furosemide (LASIX) injection 40 mg    -I have reviewed the patients home medicines and have made adjustments as needed   Cardiac Monitoring: The Benjamin Cook was maintained on a cardiac monitor.  I personally viewed and interpreted the cardiac monitored which showed an underlying rhythm of: NSR  Social Determinants of Health:  Diagnosis or treatment significantly limited by social determinants of health: current smoker   Reevaluation: After the interventions noted above, I reevaluated the Benjamin Cook and found that their symptoms have improved  Co morbidities that complicate the Benjamin Cook evaluation  Past Medical History:  Diagnosis Date   AAA (abdominal aortic aneurysm) ([name])    Adrenal mass ([name])    per pt this is remote (10 years) and benign by biopsy   AICD (automatic cardioverter/defibrillator) present    Anxiety    Arthritis    CAD (coronary artery disease)    a. s/p PCI to LAD 2001 b. myoview 2014 high risk with scar LAD/RCA territory but no ischemia   Cardiomyopathy, ischemic    CHF (congestive heart  failure) ([name])    class II/III   COPD (chronic obstructive pulmonary disease) ([name])    smokes cigars but has quit cigarettes   Defibrillator discharge 04/21/2019   Diverticulitis    Dyspnea    Flu 03/2015   HTN (hypertension)    Hyperlipidemia    NSVT (nonsustained ventricular tachycardia) ([name]) 03/05/2017   Paroxysmal atrial fibrillation ([name]) 03/2015   chads2vasc score is 4   PSA (psoriatic arthritis) ([name])    increased   PVD (peripheral vascular disease) ([name])       Dispostion: Disposition decision including need for hospitalization was considered, and Benjamin Cook admitted to the hospital.    Final Clinical Impression(s) / ED Diagnoses Final diagnoses:  Acute hypoxemic respiratory failure ([name])     This chart was dictated using voice recognition software.  Despite best efforts to proofread,  errors can occur which can change the documentation meaning.    Lonell Grandchild, MD 10/29/22 [phone]

## 2022-10-29 NOTE — Progress Notes (Addendum)
Patient transferred from stretcher to bed. Reports feeling too weak to walk. Says he holds on to his wife at home. Weight obtained while in bed. Vital signs obtained. Patient placed on telemetry. Has AICD. Patient has accessed R port. Reports he is receiving chemotherapy for lung cancer. States he was supposed to receive treatment today. CHG bath given on admission to floor.  Bed alarm on. Call button within reach. Patient informed of NPO order. Wife currently not at bedside at this time. Admission screening done prior to patient arriving to floor.   Belongings: pajama pants.

## 2022-10-29 NOTE — Assessment & Plan Note (Addendum)
Patient with diagnosis of Stage III NSCLC diagnosed on 06/03/2022 (followed by Dr. Bertis Ruddy), with 4.1 cm R hilar lesion with bilateral bronchial thickening. No evidence of metastatic disease per PET in 06/2022. Currently on chemotherapy with Durvalumab q28d, last dose on 10/01/2022. No recent weight loss, hemoptysis. No HA, abdominal pain, nausea, vomiting, diarrhea.  - Contact Oncologist in AM to determine if patient's presentation could be related to immunotherapy - Tylenol PRN for pain

## 2022-10-29 NOTE — Assessment & Plan Note (Signed)
Patient with diagnosis of Stage III NSCLC diagnosed on 06/03/2022 (followed by Dr. Bertis Ruddy), with 4.1 cm R hilar lesion with bilateral bronchial thickening. No evidence of metastatic disease per PET in 06/2022. Currently on chemotherapy with Durvalumab q28d, last dose on 10/01/2022. No recent weight loss, hemoptysis. No HA, abdominal pain, nausea, vomiting, diarrhea.

## 2022-10-29 NOTE — Progress Notes (Signed)
RT switched Pt's mask due to increase leak. Pt now on M Mask,leak resolved. Pt's respiratory status improving weaned FiO2 to 60% and weaned EPAP to +6 (previous settings 100%,+14). Pt tolerating well at this time.

## 2022-10-29 NOTE — Progress Notes (Signed)
Pt was weaned off Bipap to humidified [place] but did not tolerate due to mouth breathing. RT placed venturi mask 12L 50%. Pt tolerating fairly well.

## 2022-10-29 NOTE — Progress Notes (Signed)
   FMTS Attending Admission Note: Benjamin Starr, MD   For questions about this patient, please use [email or web address] to page the family medicine resident on call. Pager number [phone].    I  have personally seen and examined this patient, reviewed their chart. I have discussed this patient with the resident. I agree with the resident's findings, assessment and care plan as documented below.  77 year old gentleman with history significant for non-small cell lung cancer currently on PD-1 inhibitor, heart failure with severely reduced ejection fraction, ventricular tachycardia with ICD in place, atrial fibrillation and coronary artery disease status post stent many years ago presenting with dyspnea.  He reports intermittent and significant shortness of breath over the last several weeks.  This started after his infusion earlier this month with the Durvalumab.  He reports some orthopnea and paroxysmal nocturnal dyspnea.  He has no edema no weight gain.  No nausea and vomiting.  He feels cold most of the time and has had subjective fevers.  Medical history is reviewed, he is admitted for ventricular tachycardia in May.  He is not on any diuretic at home.  He has never been on home oxygen.  He does have a history of COPD.  For his non-small cell lung cancer he is status post chemo and radiation which he recently completed he received his first infusion ofDurvalumab in late August. Otherwise medical history notable for paroxysmal atrial fibrillation on Xarelto and psoriatic arthritis   Socially having his wife have been married for over 50 years.  He is a former cigar smoker he does not use any heavy alcohol.  He worked for [employer] for over 25 years.  He still likes to mow the grass.   Labs, chest x-ray prior echocardiogram and EKG reviewed.  EKG shows tachycardia with a right bundle branch block and ST and T wave changes, this was reviewed with cardiology who deemed this not to be a STEMI.  His troponins are  relatively flat in the 30s.  His BNP is markedly elevated.  On exam he is a slightly ill-appearing older gentleman.  He has a Venturi mask in place.  He speaks in 3-4 word sentences only.  He is alert and oriented to person place and situation.  Cardiac exam regular rate and rhythm.  Lungs he has rhonchi throughout the right lung field most notable in the right lower and middle lobe.  There is no wheezing on exam.  His abdomen is soft nontender nondistended.  There is no lower extremity edema.  JVP estimated to be 5 cm. Acute hypoxemic respiratory failure, this is new.  Differential is broad but most likely heart failure exacerbation, acute on chronic systolic heart failure.  Also considered pulmonary embolism, pneumonia less likely COPD exacerbation.   Other etiologies to consider given the timeline in relation to his infusion would be an autoimmune mediated lung injury or autoimmune myocarditis.  Will obtain CT imaging of the lungs to both evaluate the lungs and as well as for pulmonary embolism echocardiogram ordered.  Agree with empiric treatment of pneumonia given his elevated white count and immunocompromise state.  Will obtain echocardiogram.  Can go back on BiPAP tonight if he has worsening respiratory status.  Follow-up blood cultures.  He received antibiotics today.  Pending his CT we may continue to use tomorrow. Hyperglycemia---CBGs and sliding scale insulin. Will sign resident note as it is available.

## 2022-10-29 NOTE — Progress Notes (Addendum)
FMTS Brief Progress Note  S:Went bedside to check on patient w/ Dr. Weston Settle. Patient doing well, laying comfortably in bed. Reports his breathing has improved.    O: BP 118/61   Pulse 75   Temp (!) 97.5 F (36.4 C) (Axillary)   Resp (!) 21   Ht 5\' 10"  (1.778 m)   Wt 60.8 kg   SpO2 98%   BMI 19.23 kg/m   General: NAD, elderly, frail, conversant Resp: Good air movement, no crackles or wheezes Abd: Soft, NTTP, non-distended Ext: No pitting edema  A/P: CHF Exacerbation Patient comes in for CHF exacerbation, with elevated BNP, and dyspnea.  Patient was requiring BiPAP, has now since transitioned to 6 L Venturi mask.  Patient comfortable with mask on, satting around 98% however desats to 91% with mask off.  Will continue Venturi mask for now.  Patient lungs appear clear, with good WOB, but given desat off of max, will elect to re-dose lasix.  Will continue with day team plans. -Lasix 40 mg x 1 -Continue Venturi Mask -Plans per day team - AM labs - Continue to monitor - Will remain n.p.o. while on Venturi mask, consider diet if able to get to transition to nasal cannula - Orders reviewed. Labs for AM ordered, which was adjusted as needed.   Bess Kinds, MD 10/29/2022, 9:17 PM PGY-3, [facility] Night Resident  Please page [phone] with questions.

## 2022-10-29 NOTE — Assessment & Plan Note (Addendum)
Patient with history of paroxysmal atrial fibrillation, has had ICD placement in 2016. Currently controlled on amiodarone 200 mg BID as well as Xarelto 20 mg daily. Sinus rhythm on presentation, tachycardic to 100, but now HR in 90s. No chest pain or palpitations. EKG with no evidence of acute ischemia.  - Continue home Amiodarone 200 mg BID - Continue Xarelto 20 mg daily  - Cardiac monitoring

## 2022-10-29 NOTE — Assessment & Plan Note (Addendum)
Patient with chronic congestive heart failure (EF of 20-25% as of 05/2022). Reports he was told to stop taking his home Entresto, Furosemide, and Aldactone for the past 2-3 months, with worsening SOB episodes since discontinuation. BNP elevated markedly and CXR with pulmonary edema, but appears dry on exam with cold extremities.  - Consider further diuresis with additional dose of Lasix 40 mg IV if patient with inadequate output at 6 PM 9/24.  - Repeat Echo - Strict I/Os - Hold other GDMT including metoprolol, aldactone, and entresto in the setting of soft blood pressures

## 2022-10-29 NOTE — Assessment & Plan Note (Addendum)
Patient presented with acute shortness of breath and hypoxia to 80s, placed on BiPAP in ED, now satting in 90s on 60% FiO2. Shortness of breath worsening over 1 month, acutely worse today. No chest pain, leg pain/swelling, unintentional weight gain. Also with subjective fevers at home x 3-4 days, afebrile in ED. GDMT for HF discontinued in 07/2022 (including Entresto, home Lasix 20 mg, and Aldactone) 2/2 hypotension. Received DuoNebs x1 in ED, IV Abx, and Lasix 40 mg IV. Most likely etiology HF exacerbation with volume overload given CXR consistent with pulmonary edema and elevated BNP to 2.4K (bl 200). However, patient appears dry on clinical exam with cold extremities. Bacterial/viral PNA less likely given afebrile and CXR inconsistent with PNA, however, given subjective fevers at home and risk factors will continue Abx at this time. Need to rule out PE given hypercoagulable state and sudden onset of symptoms.   - Pro Cal - Lactic Acid   - if elevated, consult Cardiology - Continue IV Abx with Azithromycin and Rocephin  - Consider stopping if low c/f infxn - Follow up blood cultures - MRSA nares - RPP Panel  - COVID PCR - Wean to high flow [place] as tolerated per RT   - Consider resuming Bipap at night if needed for resp support - Consider further diuresis with additional dose of Lasix 40 mg IV if patient with inadequate output at 6 PM 9/24.  - Strict I/Os - CT PE to workup potential PE

## 2022-10-29 NOTE — ED Triage Notes (Signed)
Pt was BIB [facility]  from home. EMS reported patient has been having shortness ofd breath of two months. He does have a history of lung cancer. Patient was in the 80's on room air and was initially placed on nonrebreather. After the nonrebreather was placed his saturation went in the lower 90's. EMS said patient had wheezing in the upper lobe and diminish in the lower. He was given a Duoneb neb at 0740 and saturation went to 95 to 97 while on the nonrebreather. He was given solumedrol 125 at 0745 and 2 gram of magnesium at 0748. Vital signs: BP: 180/90, HR 100 and patient has a pacemaker. He is A & O x 4.

## 2022-10-29 NOTE — Telephone Encounter (Signed)
Pt's wife called and LVM to inform staff that pt will not be coming in for appts today due to him having to go to the ED. Pt's wife states that "they think its a heart attack."  This RN made Dr. Bertis Ruddy aware.

## 2022-10-29 NOTE — H&P (Signed)
Hospital Admission History and Physical Service Pager: [phone]  Patient name: Benjamin Cook Medical record number: [id number] Date of Birth: [birth year] Age: 77 y.o. Gender: male  Primary Care Provider: [name], MD Consultants: None Code Status: Full Code  Preferred Emergency Contact: Sacario, Reetz (Spouse), [phone] (Mobile)   Chief Complaint: Shortness of Breath  Assessment and Plan: Benjamin Cook is a 77 y.o. male with a past medical history of Stage 3 NSCLC (currently on immunotherapy with Durvalumab), CHF (EF 20-25% per Echo 05/2022), Afib on Xarelto, COPD, CAD, and HTN presenting here with acute hypoxic respiratory failure. Differential for this patient's presentation includes CHF exacerbation with volume overload. Suspect this is most likely etiology given patient's CXR findings consistent with pulmonary edema, markedly elevated BNP, and recent discontinuation of GDMT for heart failure. Less likely COPD exacerbation given that patient only meets 1 of 3 cardinal symptoms with hypoxia, no increased cough or sputum production/quality. Bacterial/viral PNA is possible given subjective fevers and cough at home, but less likely given patient is afebrile, with no evidence of PNA on CXR or focal adventitious lung sounds. Less likely PE, given normal lower extremities, however cannot rule out given hypercoagulable state. Hypoxia may also be related to worsening/developing interstitial lung disease or cardiomyopathy given chemotherapy treatment. Etiology is likely multifactorial given patient's significant comorbidities.  Assessment & Plan Acute hypoxic respiratory failure ([name]) Patient presented with acute shortness of breath and hypoxia to 80s, placed on BiPAP in ED, now satting in 90s on 60% FiO2. Shortness of breath worsening over 1 month, acutely worse today. No chest pain, leg pain/swelling, unintentional weight gain. Also with subjective fevers at home x 3-4 days,  afebrile in ED. GDMT for HF discontinued in 07/2022 (including Entresto, home Lasix 20 mg, and Aldactone) 2/2 hypotension. Received DuoNebs x1 in ED, IV Abx, and Lasix 40 mg IV. Most likely etiology HF exacerbation with volume overload given CXR consistent with pulmonary edema and elevated BNP to 2.4K (bl 200). However, patient appears dry on clinical exam with cold extremities. Bacterial/viral PNA less likely given afebrile and CXR inconsistent with PNA, however, given subjective fevers at home and risk factors will continue Abx at this time. Need to rule out PE given hypercoagulable state and sudden onset of symptoms.   - Pro Cal - Lactic Acid   - if elevated, consult Cardiology - Continue IV Abx with Azithromycin and Rocephin  - Consider stopping if low c/f infxn - Follow up blood cultures - MRSA nares - RPP Panel  - COVID PCR - Wean to high flow [place] as tolerated per RT   - Consider resuming Bipap at night if needed for resp support - Consider further diuresis with additional dose of Lasix 40 mg IV if patient with inadequate output at 6 PM 9/24.  - Strict I/Os - CT PE to workup potential PE Lung cancer [facility]) Patient with diagnosis of Stage III NSCLC diagnosed on 06/03/2022 (followed by Dr. Bertis Ruddy), with 4.1 cm R hilar lesion with bilateral bronchial thickening. No evidence of metastatic disease per PET in 06/2022. Currently on chemotherapy with Durvalumab q28d, last dose on 10/01/2022. No recent weight loss, hemoptysis. No HA, abdominal pain, nausea, vomiting, diarrhea.  - Contact Oncologist in AM to determine if patient's presentation could be related to immunotherapy - Tylenol PRN for pain Chronic systolic CHF (congestive heart failure) ([name]) Patient with chronic congestive heart failure (EF of 20-25% as of 05/2022). Reports he was told to stop taking his  home Entresto, Furosemide, and Aldactone for the past 2-3 months, with worsening SOB episodes since discontinuation. BNP elevated markedly and  CXR with pulmonary edema, but appears dry on exam with cold extremities.  - Consider further diuresis with additional dose of Lasix 40 mg IV if patient with inadequate output at 6 PM 9/24.  - Repeat Echo - Strict I/Os - Hold other GDMT including metoprolol, aldactone, and entresto in the setting of soft blood pressures  COPD, severe ([name]) Patient with long-standing COPD, home regimen includes Dulera and rescue albuterol inhaler. Less likely COPD exacerbation given patient only meets 1 of 3 cardinal symptoms, no increase in cough or sputum production/quality. VBG not consistent with CO2 retention.  - Hold home Dulera  - DuoNebs q6h scheduled Paroxysmal atrial fibrillation [facility]) Patient with history of paroxysmal atrial fibrillation, has had ICD placement in 2016. Currently controlled on amiodarone 200 mg BID as well as Xarelto 20 mg daily. Sinus rhythm on presentation, tachycardic to 100, but now HR in 90s. No chest pain or palpitations. EKG with no evidence of acute ischemia.  - Continue home Amiodarone 200 mg BID - Continue Xarelto 20 mg daily  - Cardiac monitoring  Chronic and Stable Conditions:  Hyperlipidemia: Continue Atorvastatin 80 mg daily  Constipation: Continue Senna 2 tabs daily  GERD: Continue PO Protonix 40 mg daily Insomnia: Continue Melatonin 5 mg nightly   FEN/GI: NPO, sips with meds  VTE Prophylaxis: Xarelto   Disposition: Discharge pending clinical improvement  History of Present Illness:  Benjamin Cook is a 77 y.o. male with a past medical history of Stage 3 NSCLC (currently on immunotherapy with Durvalumab), CHF (EF 20-25% per Echo 05/2022), Afib on Xarelto, COPD, CAD, and HTN presenting here with shortness of breath and hypoxia.   Patient reports that he woke up at 7 AM feeling short of breath and diaphoretic. Also had some central chest pain for a few minutes, but this has since resolved. Reports progressively worsening shortness of breath for the past 1 month,  but states it acutely worsened this morning on awakening. He tried using his Albuterol inhaler, this did not help. He denies any lower extremity edema, abdominal swelling, or unintentional weight gain. No calf pain, no recent travel or prolonged periods of inactivity. Further reports subjective fever and chills over the past few days, but no recorded temperature. Has a chronic productive cough at baseline, reports that his cough is not significantly worse than his baseline. No change in sputum color or production amount. No hemoptysis. Of note, patient reports he was told to stop several of his medications due to hypotension in 07/2022 by Oncologist, has stopped taking his home Entresto, Furosemide, and Aldactone for the past 2-3 months. Reports more episodes of shortness of breath since stopping these medications, but shortness of breath appeared to get worse in the past 1 month, and acutely worse this morning. No HA, vision changes, weakness, numbness, tingling. No abdominal pain, nausea, vomiting, diarrhea, urinary symptoms.   Upon arrival to ED, patient was tachycardic with HR 101, BP 131/115, tachypnic to the 40s and hypoxic to the 80s. Patient arrived on a NRB initiated by EMS, and was placed on BiPAP on arrival to the ED. Now satting 100% on BiPAP at 60% FiO2, no longer tachypnic with HR in the 80s. BNP elevated to 2.4K. Troponins 31 > 33. CBC with leukocytosis to 14.2, otherwise unremarkable. CMP with normal electrolytes, and glucose of 178. EKG with intraventricular conduction delay with some inferior lead elevations,  case was discussed with on-call STEMI doctor who felt it was not a STEMI. Patient received DuoNeb x 1, Ativan x 1, and Lasix 40 mg IV. Blood cultures pending. Started on IV Azithromycin and Ceftriaxone.   Review Of Systems: Per HPI  Pertinent Past Medical History: Non Small Cell Lung Cancer CAD (s/p PCI to LAD in 2001) CHF (EF 20-25% per Echo in  05/2022) COPD Hypertension Hyperlipidemia Paroxysmal Afib  Remainder reviewed in history tab.   Pertinent Past Surgical History: AAA stent graft (2019) Coronary Angioplasty w/stent placement (2001) Defibrillator placement (2016) L Heart Cath with coronary angiogram (2016)   Remainder reviewed in history tab.   Pertinent Social History: Tobacco use: Stopped last October Alcohol use: denies Other Substance use: denies Lives with wife, sometimes grandson visit.   Pertinent Family History: Family History  Problem Relation Age of Onset   Cirrhosis Mother        due to ETOH   Cancer Neg Hx    Remainder reviewed in history tab.   Important Outpatient Medications: Current Outpatient Medications  Medication Instructions   acetaminophen (TYLENOL) 500-1,000 mg, Oral, Every 8 hours PRN   amiodarone (PACERONE) 200 mg, Oral, 2 times daily   atorvastatin (LIPITOR) 80 MG tablet TAKE 1 TABLET BY MOUTH DAILY AT 6 PM   furosemide (LASIX) 20 mg, Oral, Daily   melatonin 5 mg, Oral, Daily at bedtime   metoprolol succinate (TOPROL-XL) 50 mg, Oral, 2 times daily, Take with or immediately following a meal.   mometasone-formoterol (DULERA) 100-5 MCG/ACT AERO 2 puffs, Inhalation, 2 times daily   Multiple Vitamin (MULTIVITAMIN) capsule 1 capsule, Oral, Daily,     ondansetron (ZOFRAN) 8 mg, Oral, Every 8 hours PRN   pantoprazole (PROTONIX) 40 mg, Oral, Daily   rivaroxaban (XARELTO) 20 mg, Oral, Daily with supper   sacubitril-valsartan (ENTRESTO) 24-26 MG 1 tablet, Oral, 2 times daily   senna-docusate (SENOKOT-S) 8.6-50 MG tablet 2 tablets, Oral, Daily   spironolactone (ALDACTONE) 25 mg, Oral, Daily   Chronic and Stable Conditions: Hyperlipidemia GERD Constipation Hypertension Insomnia   Remainder reviewed in medication history.   Objective: BP 114/70   Pulse 83   Resp (!) 25   SpO2 100%  Exam: General: Non-toxic appearing, breathing comfortably on BiPAP. Alert and oriented.  Eyes:  PERRL Neck: Supple with normal ROM Cardiovascular: Heart rate and rhythm regular. No murmurs, rubs, or gallops noted.  Respiratory: Satting well on BiPAP with no increased work of breathing at rest. Does have some conversational dyspnea. Lungs were clear to auscultation with no focal wheezing, rales, rhonchi. Poor air movement bilaterally.  Gastrointestinal: Soft, nontender, nondistended. No rebound or guarding.  Extremities: Cool hands and feet, but palpable distal pulses 2+. No lower extremity edema.  Derm: No rashes, bruises, or lesions noted. Skin is dry.  Neuro: Alert and oriented, no focal neurologic deficits.  Psych: Mood and behavior normal.   Labs:  CBC BMET  Recent Labs  Lab 10/29/22 0924 10/29/22 0936  WBC 14.0*  --   HGB 13.1 13.9  HCT 40.8 41.0  PLT 177  --    Recent Labs  Lab 10/29/22 0924 10/29/22 0936  NA 135 140  K 3.6 3.7  CL 102  --   CO2 21*  --   BUN 12  --   CREATININE 1.05  --   GLUCOSE 178*  --   CALCIUM 8.3*  --     Pertinent additional labs:  Troponin: 31 > 33 BNP: 2.4K PT/INR: 34.7/3.4 VBG:  pCO2 of 43.6, pH 7.33  EKG: EKG with intraventricular conduction delay with some inferior lead elevations, case was discussed with on-call STEMI doctor who felt it was not a STEMI.    Imaging Studies Performed:  Imaging Study CXR: IMPRESSION: 1. Large right hilar mass, compatible with patient's known malignancy. 2. Small right pleural effusion. 3. Increased interstitial markings, concerning for superimposed pulmonary edema.   [place], Medical Student 10/29/2022, 11:56 AM   I was personally present and performed or re-performed the history, physical exam and medical decision making activities of this service and have verified that the service and findings are accurately documented in the student's note.  Lincoln Brigham, MD                  10/29/2022, 5:51 PM   PGY-2, [facility]  FPTS Intern pager: [phone], text pages  welcome Secure chat group [facility] Teaching Service

## 2022-10-30 ENCOUNTER — Inpatient Hospital Stay (HOSPITAL_COMMUNITY): Payer: Medicare HMO

## 2022-10-30 ENCOUNTER — Other Ambulatory Visit (HOSPITAL_COMMUNITY): Payer: Self-pay

## 2022-10-30 ENCOUNTER — Encounter (HOSPITAL_COMMUNITY): Payer: Self-pay | Admitting: Family Medicine

## 2022-10-30 DIAGNOSIS — I5021 Acute systolic (congestive) heart failure: Secondary | ICD-10-CM | POA: Diagnosis not present

## 2022-10-30 DIAGNOSIS — C3491 Malignant neoplasm of unspecified part of right bronchus or lung: Secondary | ICD-10-CM

## 2022-10-30 DIAGNOSIS — J449 Chronic obstructive pulmonary disease, unspecified: Secondary | ICD-10-CM

## 2022-10-30 DIAGNOSIS — I5023 Acute on chronic systolic (congestive) heart failure: Secondary | ICD-10-CM

## 2022-10-30 DIAGNOSIS — J9601 Acute respiratory failure with hypoxia: Secondary | ICD-10-CM | POA: Diagnosis not present

## 2022-10-30 LAB — BLOOD GAS, ARTERIAL
Acid-Base Excess: 1 mmol/L (ref 0.0–2.0)
Bicarbonate: 23.6 mmol/L (ref 20.0–28.0)
Drawn by: 24610
O2 Saturation: 99.9 %
Patient temperature: 37
pCO2 arterial: 31 mmHg — ABNORMAL LOW (ref 32–48)
pH, Arterial: 7.49 — ABNORMAL HIGH (ref 7.35–7.45)
pO2, Arterial: 147 mmHg — ABNORMAL HIGH (ref 83–108)

## 2022-10-30 LAB — CBC
HCT: 34.3 % — ABNORMAL LOW (ref 39.0–52.0)
Hemoglobin: 11.3 g/dL — ABNORMAL LOW (ref 13.0–17.0)
MCH: 31.1 pg (ref 26.0–34.0)
MCHC: 32.9 g/dL (ref 30.0–36.0)
MCV: 94.5 fL (ref 80.0–100.0)
Platelets: 189 10*3/uL (ref 150–400)
RBC: 3.63 MIL/uL — ABNORMAL LOW (ref 4.22–5.81)
RDW: 18.5 % — ABNORMAL HIGH (ref 11.5–15.5)
WBC: 13 10*3/uL — ABNORMAL HIGH (ref 4.0–10.5)
nRBC: 0 % (ref 0.0–0.2)

## 2022-10-30 LAB — BASIC METABOLIC PANEL
Anion gap: 10 (ref 5–15)
BUN: 16 mg/dL (ref 8–23)
CO2: 26 mmol/L (ref 22–32)
Calcium: 8.7 mg/dL — ABNORMAL LOW (ref 8.9–10.3)
Chloride: 103 mmol/L (ref 98–111)
Creatinine, Ser: 1.1 mg/dL (ref 0.61–1.24)
GFR, Estimated: >60 mL/min (ref >60)
Glucose, Bld: 144 mg/dL — ABNORMAL HIGH (ref 70–99)
Potassium: 3.7 mmol/L (ref 3.5–5.1)
Sodium: 139 mmol/L (ref 135–145)

## 2022-10-30 LAB — ECHOCARDIOGRAM COMPLETE
Height: 70 in
S' Lateral: 5 cm
Single Plane A4C EF: 27.5 %
Weight: 2042.34 oz

## 2022-10-30 LAB — TROPONIN I (HIGH SENSITIVITY)
Troponin I (High Sensitivity): 38 ng/L — ABNORMAL HIGH (ref <18)
Troponin I (High Sensitivity): 50 ng/L — ABNORMAL HIGH (ref <18)
Troponin I (High Sensitivity): 63 ng/L — ABNORMAL HIGH (ref <18)

## 2022-10-30 LAB — PROTIME-INR
INR: 4.3 (ref 0.8–1.2)
Prothrombin Time: 41.2 seconds — ABNORMAL HIGH (ref 11.4–15.2)

## 2022-10-30 LAB — MAGNESIUM: Magnesium: 2 mg/dL (ref 1.7–2.4)

## 2022-10-30 MED ORDER — SODIUM CHLORIDE 0.9 % IV SOLN
500.0000 mg | INTRAVENOUS | Status: DC
  Administered 2022-10-30 – 2022-10-31 (×2): 500 mg via INTRAVENOUS
  Filled 2022-10-30 (×2): qty 5

## 2022-10-30 MED ORDER — IPRATROPIUM-ALBUTEROL 0.5-2.5 (3) MG/3ML IN SOLN: 3.0000 mL | Freq: Four times a day (QID) | RESPIRATORY_TRACT | Status: DC | PRN

## 2022-10-30 MED ORDER — RIVAROXABAN 15 MG PO TABS
15.0000 mg | ORAL_TABLET | Freq: Every day | ORAL | Status: DC
  Administered 2022-10-31 – 2022-11-03 (×4): 15 mg via ORAL
  Filled 2022-10-30 (×5): qty 1

## 2022-10-30 MED ORDER — SACUBITRIL-VALSARTAN 24-26 MG PO TABS
1.0000 | ORAL_TABLET | Freq: Two times a day (BID) | ORAL | Status: DC
  Administered 2022-10-30: 1 via ORAL
  Filled 2022-10-30: qty 1

## 2022-10-30 MED ORDER — ENSURE ENLIVE PO LIQD
237.0000 mL | Freq: Two times a day (BID) | ORAL | Status: DC
  Administered 2022-10-30 – 2022-11-06 (×13): 237 mL via ORAL

## 2022-10-30 MED ORDER — FUROSEMIDE 10 MG/ML IJ SOLN
40.0000 mg | Freq: Once | INTRAMUSCULAR | Status: AC
  Administered 2022-10-30: 40 mg via INTRAVENOUS
  Filled 2022-10-30: qty 4

## 2022-10-30 MED ORDER — MOMETASONE FURO-FORMOTEROL FUM 100-5 MCG/ACT IN AERO
2.0000 | INHALATION_SPRAY | Freq: Two times a day (BID) | RESPIRATORY_TRACT | Status: DC
  Administered 2022-10-30 – 2022-11-06 (×15): 2 via RESPIRATORY_TRACT
  Filled 2022-10-30 (×3): qty 8.8

## 2022-10-30 MED ORDER — SODIUM CHLORIDE 0.9 % IV SOLN
2.0000 g | Freq: Every day | INTRAVENOUS | Status: AC
  Administered 2022-10-30 – 2022-11-02 (×4): 2 g via INTRAVENOUS
  Filled 2022-10-30 (×4): qty 20

## 2022-10-30 MED ORDER — SPIRONOLACTONE 25 MG PO TABS
25.0000 mg | ORAL_TABLET | Freq: Every day | ORAL | Status: DC
  Administered 2022-11-01 – 2022-11-04 (×4): 25 mg via ORAL
  Filled 2022-10-30 (×6): qty 1

## 2022-10-30 MED ORDER — POTASSIUM CHLORIDE 20 MEQ PO PACK
20.0000 meq | PACK | Freq: Once | ORAL | Status: AC
  Administered 2022-10-30: 20 meq via ORAL
  Filled 2022-10-30: qty 1

## 2022-10-30 MED ORDER — METOPROLOL SUCCINATE ER 25 MG PO TB24
25.0000 mg | ORAL_TABLET | Freq: Two times a day (BID) | ORAL | Status: DC
  Administered 2022-10-31 – 2022-11-04 (×8): 25 mg via ORAL
  Filled 2022-10-30 (×9): qty 1

## 2022-10-30 MED ORDER — FUROSEMIDE 10 MG/ML IJ SOLN
60.0000 mg | Freq: Once | INTRAMUSCULAR | Status: AC
  Administered 2022-10-30: 60 mg via INTRAVENOUS
  Filled 2022-10-30: qty 6

## 2022-10-30 NOTE — Plan of Care (Signed)
Problem: Clinical Measurements: Goal: Respiratory complications will improve Outcome: Progressing

## 2022-10-30 NOTE — Progress Notes (Addendum)
FMTS Interim Progress Note  S: Paged by RN that patient was hypoxic and having work of breathing after getting up to go to the bathroom.  Seen at bedside, patient is working hard to breathe on 8 L of high flow nasal cannula.  This was bumped up to 12 L of high flow nasal cannula with no improvement in symptoms.  He was put on 15 L of nonrebreather salter.  Patient denies any chest pain.  He is very dyspneic and unable to speak full sentences.  He is alert and oriented.  I ordered chest x-ray, repeat EKG and troponin.  Rapid response was called.  EKG was obtained which appeared to be sinus tachycardia, chronic RBBB  Troponins were drawn  Chest x-ray was obtained which showed increased opacity at the right midlung from known cancer and new left midlung opacity  Lasix IV 40 given  Coarse breath sounds on exam 2+ pulses, no cool extremities No edema in LE Distended abdomen, no tenderness to palpation  Continue to monitor patient on high flow with not much improvement  Respiratory at bedside--I ordered BiPAP to be placed which was put on patient  After BiPAP patient symptomatically states that he has some improved shortness of breath  We had nursing place Foley catheter given patient did not have significant urinary output during the day and having some straining to urinate.  After placing Foley catheter patient had 650 mL urine output, likely component of retention  Plan to have night team reassessed shortly after signout Troponin initial was 38 which is reassuring, if large increase would recommend contacting cardiology If not improving on BiPAP I would recommend reaching out to CCM for further recommendations 60 mg IV Lasix ordered for midnight for continued diuresis A.M. BMP, MAP  BP (!) 179/81 (BP Location: Left Arm)   Pulse (!) 111   Temp 97.7 F (36.5 C) (Oral)   Resp (!) 35   Ht 5\' 10"  (1.778 m)   Wt 57.9 kg   SpO2 98%   BMI 18.32 kg/m     Levin Erp,  MD 10/30/2022, 7:16 PM PGY-3, [facility] Service pager [phone]

## 2022-10-30 NOTE — Hospital Course (Addendum)
Benjamin Cook is a 77 y.o.male with a history of Stage 3 NSCLC (currently on immunotherapy with Durvalumab), CHF (EF 20-25% per Echo 05/2022), Afib on Xarelto, COPD, CAD, and HTN who was admitted to the [facility] Medicine Teaching Service at [facility] for acute hypoxic respiratory failure. His hospital course is detailed below:  Acute Hypoxic Respiratory Failure Patient presented with acute SOB and hypoxic to 80s, placed on BiPAP in ED initially. CXR upon admission no PNA and EKG with no ischemia. Patient had difficulty maintaining his oxygen saturation even after trialing numerous noninvasive ventilation techniques.  Attempted steroid trial with Prednisone 40 mg x 5 days (9/29-10/3).  Was difficult to wean but upon discharge patient maintained stable O2 saturation on 3L low flow nasal cannula.  Etiology of respiratory status is multifactorial including worsening ability to compensate for underlying lung disease, chemoradiation, lung cancer, HFrEF, amiodarone therapy, COPD, and PNA. Diuresed with multiple doses of IV Lasix and transitioned to home PO Lasix to help stabilize his CHF exacerbation as well. Per PT/OT recommendations, patient to home with home health and supplemental oxygen.   Pneumonia Patient with subjective fevers and cough at home, afebrile on presentation. Initial CXR on admission with no evidence of PNA. RPP and COVID negative. Patient became febrile and repeat CXR demonstrated PNA. Blood cultures demontrated no growth at discharge. Patient received course of IV Abx including Azithromycin (9/25-9/27), and Rocephin (9/24-9/28). Afebrile, with no wheezing or productive cough on discharge.   Acute CHF Exacerbation GDMT for HF discontinued on 07/2022 (including Entresto, home Lasix 20 mg, and Aldactone) 2/2 hypotension. BNP elevated to 2.4K. CXR with pulmonary edema on presentation, but appears dry on exam with cold extremities. Echo with LVEF 20-25%, LV regional wall abnormalities, and mild aortic  calcification. As mentioned above, had difficulty weaning patient's oxygen requirement during admission even with adequate response to IV Lasix. Cardiology recommended restarting patient's home Entresto, Metoprolol, and Aldactone during admission but due to risks of falls w/ soft BPs versus benefits, GDMT was de-escalated during admission. Metoprolol reduced to 12.5 mg BID, Aldactone reduced to 12.5 mg daily, and Entresto discontinued 10/1. Was diuresed with multiple doses of IV Lasix and transitioned to home PO Lasix 20 mg daily. Euvolemic on exam, with adequate output. Recommend follow up with Cardiology for further management of GDMT for HF.   Urinary Retention Foley placement 9/24 due to urinary retention with good output, discontinued on 9/27, with successful void trail. Bladder scans since removal with < 300 cc PVR. Continue home Flomax 0.4 mg daily.   Anemia Hgb 13.9 on admission, downtrended to 11.3. Some blood noted in foley catheter, which was subsequently removed. Intermittent hematuria on persisted likely due to trauma from foley catheter placement. CBC stable at 10.1 on discharge. Patient asymptomatic with no active bleeding. Recommend outpatient CBC to trend Hgb.   Lung Cancer  GOC Patient with diagnosis of Stage III NSCLC diagnosed on 06/03/2022 (followed by Dr. Bertis Ruddy), with 4.1 cm R hilar lesion with bilateral bronchial thickening. No evidence of metastatic disease per PET in 06/2022. Currently on chemotherapy with Durvalumab q28d, last dose on 10/01/2022. Consulted Oncology, less concern for pneumonitis related to immunotherapy, they recommend outpatient immunotherapy, no further workup/recommendations for inpatient stay. Patient transitioned to DNR/DNI on 9/27. Palliative consulted to discuss his goals further and patient would like to continue being treated for his cardio/pulmonary problems, however may consider talking to oncology outpatient regarding discontinuing chemotherapy.  Recommend follow up with Oncology outpatient for further discussion of cancer treatment.  Other chronic conditions were medically managed with home medications and formulary alternatives as necessary: COPD: Stable, no increase in cough or sputum production. Continued home Dulera and albuterol inhaler. Atrial Fibrillation: stable and in sinus rhythm upon admission. Continued home medications including amiodarone and Xarelto. Xarelto decreased from 20 mg daily to 15 mg daily given her CrCl.  Neuropathy: improved during admission with addition of Gabapentin 100 mg nightly, continued on discharge.  Hyperlipidemia: stable, continue atorvastatin 80 mg daily   PCP Follow-up Recommendations: Recommend follow up with Cardiology for further management of GDMT for HF Recommend follow up with Oncology for further management of lung cancer Recommend outpatient CBC to continue to trend Hgb and for observation of hematuria.

## 2022-10-30 NOTE — Plan of Care (Signed)
Benjamin Cook is being followed in our clinic by Dr. Bertis Ruddy for his stage III non-small cell lung cancer.  He recently transitioned to immunotherapy with durvalumab, received dose on 10/01/2022.  Currently he is admitted with hypoxic respiratory failure, presumed to be from CHF exacerbation.  Our service was contacted today to see if there are any additional recommendations as far as immunotherapy is concerned, since patient was worried about missing a dose.  I reviewed his imaging studies.  I also evaluated the patient today.  Clinical picture is consistent with CHF exacerbation.  Imaging findings also indicate advanced emphysema.  No evidence of PE.  No findings to suggest pneumonitis.  Hence current presentation is less likely to be related to immunotherapy.  I do not see any indication for immunosuppressive therapy.  Agree with management of rest of the acute issues as per the primary team, including CHF exacerbation.  Immunotherapy will be resumed in the outpatient setting, pending resolution of acute issues.  I will update Dr. Bertis Ruddy tomorrow upon her return to office for any additional recommendations.

## 2022-10-30 NOTE — Progress Notes (Signed)
   Heart Failure Stewardship Pharmacist Progress Note   PCP: [name], MD PCP-Cardiologist: Olga Millers, MD    HPI:  77 yo M with PMH of CHF, afib, COPD, CAD, HTN, and stage 3 NSCLC.  Cardiac history dates back to June of 2001. At that time the patient was found to have an abnormal EKG. Catheterization revealed a 95% mid LAD and a 70% diffuse right coronary artery and underwent PCI of LAD. His EF was 25-30%.   Presented to the ED on 9/24 with shortness of breath for 2 months. CXR with concern for pulmonary edema. CTA with cardiomegaly, concern for PNA, moderate R and small L pleural effusions, and negative for PE. BNP 2456.6. ECHO pending. Last EF 20-25%.    Current HF Medications: None  Prior to admission HF Medications: Beta blocker: metoprolol XL 50 mg BID MRA: spironolactone 25 mg daily *not taking lasix or Entresto due to hypotension  Pertinent Lab Values: Serum creatinine 1.10, BUN 16, Potassium 3.7, Sodium 139, BNP 2456.6, Magnesium 2.0  Vital Signs: Weight: 127 lbs (admission weight: 134 lbs) Blood pressure: 130-140/70s  Heart rate: 70-80s  I/O: net even since admission  Medication Assistance / Insurance Benefits Check: Does the patient have prescription insurance?  Yes Type of insurance plan: [employer] P[employer] Medicare  Does the patient qualify for medication assistance through manufacturers or grants?   Pending Eligible grants and/or patient assistance programs: pending Medication assistance applications in progress: none  Medication assistance applications approved: none Approved medication assistance renewals will be completed by: pending  Outpatient Pharmacy:  Prior to admission outpatient [facility] Is the patient willing to use [facility] TOC pharmacy at discharge? Yes Is the patient willing to transition their outpatient pharmacy to utilize a [facility] outpatient pharmacy?   No    Assessment: 1. Acute on chronic systolic CHF (LVEF 20-25%), due to ICM.  NYHA class II symptoms. - Received furosemide 40 mg IV x 2. Recorded 700 mL of urine output. Does not have LE edema. Still on 4L of O2.  - Holding PTA metoprolol with initial concern for low output. Lactic acid 1.5. His legs are warm but his feet are cold. He says this is his baseline.  - Stopped Entresto 2/2 hypotension back in June - Consider restarting spironolactone 25 mg daily - Consider restarting Jardiance 10 mg daily. His wife said he was only on this for a short time before it was discontinued. It appears it was discontinued from med list 02/08/22. She does not recall any issues that he had that led to this being discontinued.    Plan: 1) Medication changes recommended at this time: - Restart metoprolol XL 25 mg BID - Restart spironolactone 25 mg daily - Start Jardiance 10 mg daily prior to discharge  2) Patient assistance: Sherryll Burger copay $47 - Farxiga/Jardiance copay $47  3)  Education  - Patient has been educated on current HF medications and potential additions to HF medication regimen - Patient verbalizes understanding that over the next few months, these medication doses may change and more medications may be added to optimize HF regimen - Patient has been educated on basic disease state pathophysiology and goals of therapy   Sharen Hones, PharmD, BCPS Heart Failure Stewardship Pharmacist Phone [phone]

## 2022-10-30 NOTE — Progress Notes (Incomplete)
FMTS Brief Progress Note  S:Met patient bedside with Dr Barbaraann Faster at approximately 8 pm. Pt was lying in bed with bipap mask in place. He reported that he had "been sicker" today, but was still pleasant and cooperative throughout the interview. He reported no chest pain, but increased work of breathing, and said that he had been feeling a good deal of pressure before the foley catheter was placed earlier today. He said that it was still having good output and he had no other major concerns.    O: BP (!) 146/70 (BP Location: Left Arm)   Pulse 91   Temp (!) 101.5 F (38.6 C) (Axillary)   Resp (!) 24   Ht 5\' 10"  (1.778 m)   Wt 57.9 kg   SpO2 100%   BMI 18.32 kg/m    General: Alert, pleasant man lying in bed. NAD. HEENT: NCAT. MMM. CV: RRR, no murmurs. Cap refill <2. Resp: CTAB, no wheezing or crackles. Normal WOB on RA.  Abm: Soft, nontender, nondistended. BS present. Ext: Moves all ext spontaneously Skin: Warm, well perfused   A/P: *** - Orders reviewed. Labs for AM {ordered not order:23822}, which was adjusted as needed.  - If condition changes, plan includes ***.   Margaretmary Dys, MD 10/30/2022, 8:24 PM PGY-1, [facility] Night Resident  Please page [phone] with questions.

## 2022-10-30 NOTE — Progress Notes (Signed)
  Echocardiogram 2D Echocardiogram has been performed.  Delcie Roch 10/30/2022, 3:20 PM

## 2022-10-30 NOTE — Assessment & Plan Note (Addendum)
Patient with diagnosis of Stage III NSCLC diagnosed on 06/03/2022 (followed by Dr. Bertis Ruddy), with 4.1 cm R hilar lesion with bilateral bronchial thickening. No evidence of metastatic disease per PET in 06/2022. Currently on chemotherapy with Durvalumab q28d, last dose on 10/01/2022. No recent weight loss, hemoptysis. No HA, abdominal pain, nausea, vomiting, diarrhea.  - Contact Oncologist in AM to determine if patient's presentation could be related to immunotherapy - Tylenol PRN for pain

## 2022-10-30 NOTE — Progress Notes (Signed)
Lab spoke to CN about a critical INR of 4.3. CN paged and spoke to the rounding team and notified about critical lab value.

## 2022-10-30 NOTE — Progress Notes (Signed)
Initial Nutrition Assessment  DOCUMENTATION CODES:   Non-severe (moderate) malnutrition in context of chronic illness, Underweight  INTERVENTION:  Liberalize diet to regular to promote adequate PO intake Ensure Enlive po BID, each supplement provides 350 kcal and 20 grams of protein. Magic cup TID with meals (chocolate and vanilla), each supplement provides 290 kcal and 9 grams of protein MVI with minerals daily "High Calorie, High Protein Nutrition Therapy" handout added to AVS  NUTRITION DIAGNOSIS:   Moderate Malnutrition related to cancer and cancer related treatments as evidenced by mild fat depletion, percent weight loss, severe muscle depletion.  GOAL:   Patient will meet greater than or equal to 90% of their needs  MONITOR:   PO intake, Supplement acceptance, Labs, Weight trends, Diet advancement  REASON FOR ASSESSMENT:   Consult Assessment of nutrition requirement/status  ASSESSMENT:   Pt admitted with acute on chronic respiratory failure in setting of CHF exacerbation. PMH significant for stage 3 NSCLC currently on immunotherapy, CHF, afib on xarelto, COPD, CAD and HTN.  Spoke with pt and his wife at bedside. Pt reports that since starting chemotherapy in July, he has had a very poor appetite. Denies n/v/altered taste. He recalls that he was eating 100% less than his baseline. Pt denies difficulty chewing/swallowing foods despite being edentulous.   He switched to immunotherapy about 1 month ago and since then has begun regaining his appetite. He recalls eating about 2 times per day but snacking frequently between meals as able. He enjoys eating a sausage and gravy biscuit from [employer], hamburgers, philly cheese steaks from [place] or [employer].   Meal completions: 9/25: 75% breakfast, 75% lunch  Pt states that he began losing weight rapidly while on chemo, 140 lbs down to 127 lbs within a months time frame. Therefore he began consuming Ensure/Boost  high calorie/protein supplements to augment intake.  Per review of weight history on file, it appears pt has had a weight loss of 13.2% within the last 8 months which is significant for time frame.   Pt wound benefit from most liberalized diet to promote adequate PO intake in the setting of malnutrition and NSCLC. Discussed ways to reduce sodium in diet to avoid fluid retention without sacrificing PO intake.   Medications: lasix (once), melatonin, protonix, senna  Labs: reviewed  NUTRITION - FOCUSED PHYSICAL EXAM:  Flowsheet Row Most Recent Value  Orbital Region Mild depletion  Upper Arm Region Severe depletion  Thoracic and Lumbar Region Moderate depletion  Buccal Region No depletion  Temple Region Mild depletion  Clavicle Bone Region Severe depletion  Clavicle and Acromion Bone Region Moderate depletion  Scapular Bone Region Severe depletion  Dorsal Hand Moderate depletion  Patellar Region Severe depletion  Anterior Thigh Region Severe depletion  Posterior Calf Region Severe depletion  Edema (RD Assessment) None  Hair Reviewed  Eyes Reviewed  Mouth Other (Comment)  [edentulous]  Skin Reviewed  Nails Reviewed       Diet Order:   Diet Order             Diet Heart Room service appropriate? Yes; Fluid consistency: Thin  Diet effective now                   EDUCATION NEEDS:   Education needs have been addressed  Skin:  Skin Assessment: Reviewed RN Assessment  Last BM:  PTA  Height:   Ht Readings from Last 1 Encounters:  10/29/22 5\' 10"  (1.778 m)    Weight:   Wt  Readings from Last 1 Encounters:  10/30/22 57.9 kg   BMI:  Body mass index is 18.32 kg/m.  Estimated Nutritional Needs:   Kcal:  1700-1900  Protein:  85-100g  Fluid:  >/=1.7L  Drusilla Kanner, RDN, LDN Clinical Nutrition

## 2022-10-30 NOTE — Assessment & Plan Note (Addendum)
Patient with long-standing COPD, home regimen includes Dulera and rescue albuterol inhaler. No increase in cough or sputum production/quality. No wheezing noted on lung exam.  - DuoNebs PRN - Restart home [facility]

## 2022-10-30 NOTE — Assessment & Plan Note (Addendum)
Patient with history of paroxysmal atrial fibrillation, has had ICD placement in 2016. Currently controlled on amiodarone 200 mg BID as well as Xarelto 20 mg daily. Sinus rhythm on presentation, tachycardic to 100, but now HR in 80s. EKG with no evidence of acute ischemia. No chest pain or palpitations. - Continue home Amiodarone 200 mg BID - REDUCE Xarelto to 15 mg daily, due to CrCl < 50 - EKG in AM - Cardiac monitoring

## 2022-10-30 NOTE — Assessment & Plan Note (Addendum)
Patient with chronic congestive heart failure (EF of 20-25% as of 05/2022). Reports he was told to stop taking his home Entresto, Furosemide, and Aldactone for the past 2-3 months, with worsening SOB episodes since discontinuation. BNP elevated markedly and CXR with pulmonary edema on presentation, but appears dry on exam with cold extremities. Initially on BiPAP, now on 4L [place] s/p Lasix 40 mg IV x 2. Wt down 1.5 kg. Cr stable at 1.1, K 3.7. Mg 2.0. - Lasix IV 40 mg x 2 today (Dose at 1200, 1800) - Strict I/Os - 20 meQ oral K - Goals K > 4, Mg > 2 - BMP, Mg in AM - Restart home Entresto 25 mg BID. Continue to hold other GDMT including metoprolol and aldactone

## 2022-10-30 NOTE — Progress Notes (Signed)
RN assisted patient back to [facility], patient RR increase during transfer to >40 while oxygen sats 70s on 8L HFNC. RN increased oxygen to 12L HFNC, sats maintaining 88%, educated on deep breathing techniques, labored and restless. RN paged RT, Rapid RN, and rounding team. RN administered scheduled IV lasix 40mg  as ordered. RT placed patient on 15L [place] and 15L NRB. RN noted rhythm change. MD rounded. EKG ordered and obtained. CXR ordered. BIPAP ordered.   1820:Foley ordered, patient educated on purpose of foley, patient wife and patient verbalize understanding. RN performed peri care and foley care with Rapid RN at bedside. RN inserted 14Fr foley and patient tolerated well. Patient yielded 650cc in foley bag. Foley bag dated.  1830: Patient is alert, shows no respiratory distress currently on BIPAP, call bell within reach with wife at bedside. Bed at lowest setting.

## 2022-10-30 NOTE — Assessment & Plan Note (Addendum)
Hgb 13.9 on admission, downtrended to 11.3 this AM. Patient asymptomatic with no signs or symptoms consistent with active bleed. Will continue to monitor.  - AM CBC

## 2022-10-30 NOTE — Progress Notes (Signed)
RN called d/t pt desaturating 87% on 6 lpm [place].  [place] HFNC initiated at 8 lpm, currently sat  now 92-93%.  BBS[facility] w/ fine crackles= RN is aware.

## 2022-10-30 NOTE — Progress Notes (Signed)
1845- ABG sent to lab, lab was called at 1845

## 2022-10-30 NOTE — Progress Notes (Addendum)
Daily Progress Note Intern Pager: [phone]  Patient name: Benjamin Cook Medical record number: [id number] Date of birth: April 30, 1945 Age: 77 y.o. Gender: male  Primary Care Provider: [name], MD Consultants: Oncology Code Status: Full Code  Pt Overview and Major Events to Date:  9/24: Admitted to FMTS 9/25: Weaned to 4L [place]  Assessment and Plan:  Benjamin Cook is a 77 y.o. male with a past medical history of Stage 3 NSCLC (currently on immunotherapy with Durvalumab), CHF (EF 20-25% per Echo 05/2022), Afib on Xarelto, COPD, CAD, and HTN presenting here with acute hypoxic respiratory failure in the setting of likely CHF exacerbation.  Assessment & Plan Acute hypoxic respiratory failure ([name]) Patient presented with acute shortness of breath and hypoxia to 80s, placed on BiPAP in ED initially. GDMT for HF discontinued in 07/2022 (including Entresto, home Lasix 20 mg, and Aldactone) 2/2 hypotension. BNP elevated to 2.4K, CXR consistent with pulmonary edema. ProCal 0.81, RPP  negative, COVID negative, MRSA nares negative. CT PE with superimposed airspace disease concerning for PNA, however clinically, low suspicion for PNA given no infectious symptoms, afebrile, with downtrending white count (14 > 13). Negative for PE. S/P Lasix IV 40 mg x 2 with improvement in respiratory status, now on 4L [place], satting in 90s. Etiology likely CHF exacerbation with volume overload, less concern for bacterial PNA at this time.  - Follow up Echo - Follow up Blood Cultures - STOP IV Abx (Azithromycin and Rocephin) - Wean oxygen as tolerated  - Consider BiPAP at night if needed for respiratory support - Will order Lasix 40 mg IV x 2 today (dose at 1200, 1800) - AM BMP - PT/OT - Orthostatics prior to discharge Lung cancer [name]) Patient with diagnosis of Stage III NSCLC diagnosed on 06/03/2022 (followed by Dr. Bertis Cook), with 4.1 cm R hilar lesion with bilateral bronchial thickening. No evidence of  metastatic disease per PET in 06/2022. Currently on chemotherapy with Durvalumab q28d, last dose on 10/01/2022. No recent weight loss, hemoptysis. No HA, abdominal pain, nausea, vomiting, diarrhea.  - Contact Oncologist in AM to determine if patient's presentation could be related to immunotherapy - Tylenol PRN for pain Chronic systolic CHF (congestive heart failure) ([name]) Patient with chronic congestive heart failure (EF of 20-25% as of 05/2022). Reports he was told to stop taking his home Entresto, Furosemide, and Aldactone for the past 2-3 months, with worsening SOB episodes since discontinuation. BNP elevated markedly and CXR with pulmonary edema on presentation, but appears dry on exam with cold extremities. Initially on BiPAP, now on 4L [place] s/p Lasix 40 mg IV x 2. Wt down 1.5 kg. Cr stable at 1.1, K 3.7. Mg 2.0. - Lasix IV 40 mg x 2 today (Dose at 1200, 1800) - Strict I/Os - 20 meQ oral K - Goals K > 4, Mg > 2 - BMP, Mg in AM - Restart home Entresto 25 mg BID. Continue to hold other GDMT including metoprolol and aldactone COPD, severe ([name]) Patient with long-standing COPD, home regimen includes Dulera and rescue albuterol inhaler. No increase in cough or sputum production/quality. No wheezing noted on lung exam.  - DuoNebs PRN - Restart home Dulera Paroxysmal atrial fibrillation [facility]) Patient with history of paroxysmal atrial fibrillation, has had ICD placement in 2016. Currently controlled on amiodarone 200 mg BID as well as Xarelto 20 mg daily. Sinus rhythm on presentation, tachycardic to 100, but now HR in 80s. EKG with no evidence of acute ischemia. No  chest pain or palpitations. - Continue home Amiodarone 200 mg BID - REDUCE Xarelto to 15 mg daily, due to CrCl < 50 - EKG in AM - Cardiac monitoring Anemia Hgb 13.9 on admission, downtrended to 11.3 this AM. Patient asymptomatic with no signs or symptoms consistent with active bleed. Will continue to monitor.  - AM CBC   Chronic and  Stable Issues: Hyperlipidemia: Continue Atorvastatin 80 mg daily  Constipation: Continue Senna 2 tabs daily  GERD: Continue PO Protonix 40 mg daily  Insomnia: Continue Melatonin 5 mg nightly   FEN/GI: Heart Healthy Diet PPx: Xarelto Dispo: Home pending clinical improvement  Subjective:  Patient is feeling much improved this morning. He is breathing comfortably on 4L [place], denies any chest pain, palpitations, shortness of breath. No leg swelling, leg pain, abdominal pain, n/v/d. No HA, weakness, numbness, tingling.   Objective: Temp:  [96.5 F (35.8 C)-98.5 F (36.9 C)] 98 F (36.7 C) (09/25 0419) Pulse Rate:  [67-101] 82 (09/25 0419) Resp:  [19-44] 20 (09/25 0419) BP: (114-160)/(61-115) 133/73 (09/25 0419) SpO2:  [91 %-100 %] 95 % (09/25 0419) FiO2 (%):  [40 %-100 %] 40 % (09/25 0112) Weight:  [58.2 kg-60.8 kg] 58.2 kg (09/25 0022) Physical Exam: General: Non toxic appearing, breathing comfortably on 4L [place], alert and oriented.  Cardiovascular: Heart rate and rhythm regular. No murmurs, rubs, gallops.  Respiratory: Lungs are clear to auscultation no focal wheezing, rales, rhonchi. No upper accessory muscle usage, breathing comfortably, no conversational dyspnea.  Abdomen: Soft, non-tender, non-distended. No rebound or guarding.  Extremities: Cool hands and feet, but palpable distal pulses 2+. No lower extremity edema.   Laboratory: Most recent CBC Lab Results  Component Value Date   WBC 13.0 (H) 10/30/2022   HGB 11.3 (L) 10/30/2022   HCT 34.3 (L) 10/30/2022   MCV 94.5 10/30/2022   PLT 189 10/30/2022   Most recent BMP    Latest Ref Rng & Units 10/30/2022    5:50 AM  BMP  Glucose 70 - 99 mg/dL 366   BUN 8 - 23 mg/dL 16   Creatinine 4.40 - 1.24 mg/dL 3.47   Sodium 425 - 956 mmol/L 139   Potassium 3.5 - 5.1 mmol/L 3.7   Chloride 98 - 111 mmol/L 103   CO2 22 - 32 mmol/L 26   Calcium 8.9 - 10.3 mg/dL 8.7    Other Pertinent Labs:   ProCal = 0.81 MRSA nares:  negative COVID = negative RPP: negative Lactate: 1.5 Troponin: 31 > 33 BNP (9/24): 2.5K PT/INR: 34.7/3.4 VBG: pCO2 of 43.6, pH 7.33  Imaging/Diagnostic Tests:  Imaging Study CXR: IMPRESSION: 1. Large right hilar mass, compatible with patient's known malignancy. 2. Small right pleural effusion. 3. Increased interstitial markings, concerning for superimposed pulmonary edema.  CT PE: No acute pulmonary embolus. Suspected underlying radiation fibrosis, suspected interval development of airspace disease suspicious for superimposed PNA.    [place], Medical Student 10/30/2022, 7:33 AM  PGY-, [facility] Family Medicine FPTS Intern pager: [phone], text pages welcome Secure chat group [facility] Teaching Service   Upper Level Addendum:  I have seen and evaluated this patient along with Student Doctor Meredith Pel and reviewed the above note, making necessary revisions as appropriate.  I agree with the medical decision making and physical exam as noted above.  Levin Erp, MD PGY-3 [facility] Residency

## 2022-10-30 NOTE — Progress Notes (Signed)
Heart Failure Nurse Navigator Progress Note  PCP: [name], MD PCP-Cardiologist: [name] Admission Diagnosis: Acute hypoxemic respiratory failure.  Admitted from: Home via EMS  Presentation:   Benjamin Cook presented with shortness of breath x 2 months, slowly getting worse, with history of lung cancer, placed on BiPap, EKG with intraventricular conduction delay, BNP 2,456, CXR concerning for pulmonary edema, CTA with cardiomegaly and concerns for PNA, with moderate right and small left pleural effusion.   Patient was educated on the sign and symptoms of heart failure, daily weights, when ot call his doctor or go to the ED, Diet/ fluid restrictions, taking all medications as prescribed and attending all medical appointments, patient verbalized his understanding of education, a HF TOC appointment was scheduled for 11/13/2022 @ 10 am.   ECHO/ LVEF: 20-25%  Clinical Course:  Past Medical History:  Diagnosis Date   AAA (abdominal aortic aneurysm) ([name])    Adrenal mass ([name])    per pt this is remote (10 years) and benign by biopsy   AICD (automatic cardioverter/defibrillator) present    Anxiety    Arthritis    CAD (coronary artery disease)    a. s/p PCI to LAD 2001 b. myoview 2014 high risk with scar LAD/RCA territory but no ischemia   Cardiomyopathy, ischemic    CHF (congestive heart failure) ([name])    class II/III   COPD (chronic obstructive pulmonary disease) ([name])    smokes cigars but has quit cigarettes   Defibrillator discharge 04/21/2019   Diverticulitis    Dyspnea    Flu 03/2015   HTN (hypertension)    Hyperlipidemia    NSVT (nonsustained ventricular tachycardia) ([name]) 03/05/2017   Paroxysmal atrial fibrillation ([name]) 03/2015   chads2vasc score is 4   PSA (psoriatic arthritis) ([name])    increased   PVD (peripheral vascular disease) ([name])      Social History   Socioeconomic History   Marital status: Married    Spouse name: Not on file   Number of  children: Not on file   Years of education: Not on file   Highest education level: Not on file  Occupational History   Occupation: Lawn care  Tobacco Use   Smoking status: Some Days    Types: Cigars    Last attempt to quit: 2020    Years since quitting: 4.7   Smokeless tobacco: Never   Tobacco comments:    smokes cigars now, no longer smokes cigarettes but previously smoked 2ppd  Vaping Use   Vaping status: Never Used  Substance and Sexual Activity   Alcohol use: No   Drug use: No   Sexual activity: Not Currently    Partners: Female    Birth control/protection: None  Other Topics Concern   Not on file  Social History Narrative   Not on file   Social Determinants of Health   Financial Resource Strain: Not on file  Food Insecurity: No Food Insecurity (10/29/2022)   Hunger Vital Sign    Worried About Running Out of Food in the Last Year: Never true    Ran Out of Food in the Last Year: Never true  Transportation Needs: No Transportation Needs (10/29/2022)   PRAPARE - Administrator, Civil Service (Medical): No    Lack of Transportation (Non-Medical): No  Physical Activity: Not on file  Stress: Not on file  Social Connections: Unknown (06/19/2021)   Received from [facility]   Social Network    Social Network:  Not on file   Education Assessment and Provision:  Detailed education and instructions provided on heart failure disease management including the following:  Signs and symptoms of Heart Failure When to call the physician Importance of daily weights Low sodium diet Fluid restriction Medication management Anticipated future follow-up appointments  Patient education given on each of the above topics.  Patient acknowledges understanding via teach back method and acceptance of all instructions.  Education Materials:  "Living Better With Heart Failure" Booklet, HF zone tool, & Daily Weight Tracker Tool.  Patient has scale at home:  yes Patient has pill box at home: yes    High Risk Criteria for Readmission and/or Poor Patient Outcomes: Heart failure hospital admissions (last 6 months): 0  No Show rate: 5% Difficult social situation: no, lives with wife Demonstrates medication adherence: yes Primary Language: English Literacy level: Reading, writing, and comprehension  Barriers of Care:   Diet/ fluid restrictions ( high salty foods)  Daily weights  Considerations/Referrals:   Referral made to Heart Failure Pharmacist Stewardship: yes Referral made to Heart Failure CSW/NCM TOC: No Referral made to Heart & Vascular TOC clinic: Yes, 11/13/2022 @ 10 am.   Items for Follow-up on DC/TOC: Diet/ fluid restrictions ( high salt diet, example , pigs feet, meat subs, [employer], etc) Daily weights Some HF medications stopped by PCP.    Rhae Hammock, BSN, Scientist, clinical (histocompatibility and immunogenetics) Only

## 2022-10-30 NOTE — Progress Notes (Signed)
Discussed with Dr. Arlana Pouch with oncology via secure chat:  Per him, imaging of lungs does not indicate pneumonitis picture and is more consistent with his advanced emphysema, CHF related changes less likely to be related to immunotherapy. There are no plans to treat him with immunotherapy during this hospitalization. He will update Dr. Bertis Ruddy his primary oncologist tomorrow once she returns to office. Agrees with continuing rest of the management and recommend reaching out to oncology again if any questions recur.

## 2022-10-30 NOTE — Significant Event (Signed)
Rapid Response Event Note   Reason for Call :  Respiratory Distress  Initial Focused Assessment:  On arrival, pt sitting up in bed with labored breathing on 15L [place] and 15L NRB. Skin warm/dry with mottling noted to BUE, BLE. Lungs coarse throughout. No edema noted to BLE.   Vitals: HR 107, BP 179/81, RR 41, spO2 96%  Per RN, pt just got back to bed from [facility] and became SOB and labored. Rhythm changed noted on monitor as well.    Interventions:  40mg  Lasix IV EKG CXR Bipap Foley Abg  Plan of Care:  Continue to monitor pt respiratory status.  RN instructed to call with any changes or concerns.   Event Summary:   MD Notified: PTA- at bedside Call Time: 1735 Arrival Time: 1738 End Time: 1835  Mordecai Rasmussen, RN

## 2022-10-30 NOTE — TOC Initial Note (Signed)
Transition of Care [facility]) - Initial/Assessment Note    Patient Details  Name: Benjamin Cook MRN: [id number] Date of Birth: 1945-07-18  Transition of Care [facility]) CM/SW Contact:    Leone Haven, RN Phone Number: 10/30/2022, 3:15 PM  Clinical Narrative:                 From home with spouse, has PCP and insurance on file, wife states has no HH services in place at this time , has a cane at home, but does not use it.    She States she will transport  him home at [employer] and family is support system, states [facility] [place].  Pta self ambulatory   Expected Discharge Plan: Home w Home Health Services Barriers to Discharge: Continued Medical Work up   Patient Goals and CMS Choice Patient states their goals for this hospitalization and ongoing recovery are:: return home with wife   Choice offered to / list presented to : NA      Expected Discharge Plan and Services In-house Referral: NA Discharge Planning Services: CM Consult Post Acute Care Choice: NA Living arrangements for the past 2 months: Single Family Home                 DME Arranged: N/A DME Agency: NA       HH Arranged: NA          Prior Living Arrangements/Services Living arrangements for the past 2 months: Single Family Home Lives with:: Spouse Patient language and need for interpreter reviewed:: Yes Do you feel safe going back to the place where you live?: Yes      Need for Family Participation in Patient Care: Yes (Comment) Care giver support system in place?: Yes (comment) Current home services: DME (cane) Criminal Activity/Legal Involvement Pertinent to Current Situation/Hospitalization: No - Comment as needed  Activities of Daily Living   ADL Screening (condition at time of admission) Does the patient have a NEW difficulty with bathing/dressing/toileting/self-feeding that is expected to last >3 days?: No Does the patient have a NEW difficulty with getting in/out of bed,  walking, or climbing stairs that is expected to last >3 days?: No Does the patient have a NEW difficulty with communication that is expected to last >3 days?: No Is the patient deaf or have difficulty hearing?: Yes Does the patient have difficulty seeing, even when wearing glasses/contacts?: Yes Does the patient have difficulty concentrating, remembering, or making decisions?: Yes  Permission Sought/Granted Permission sought to share information with : Case Manager Permission granted to share information with : Yes, Verbal Permission Granted              Emotional Assessment       Orientation: : Oriented to Self, Oriented to Place, Oriented to  Time, Oriented to Situation Alcohol / Substance Use: Not Applicable Psych Involvement: No (comment)  Admission diagnosis:  Acute hypoxemic respiratory failure ([name]) [J96.01] Acute hypoxic respiratory failure ([name]) [J96.01] Patient Active Problem List   Diagnosis Date Noted   Acute hypoxic respiratory failure ([name]) 10/29/2022   Acute hypoxemic respiratory failure ([name]) 10/29/2022   Acute allergic reaction 08/12/2022   Drug-induced hypotension 07/29/2022   Pancytopenia, acquired ([name]) 07/29/2022   Other constipation 07/12/2022   Non-small cell lung cancer ([name]) 06/11/2022   Lymphangitis 06/07/2022   Ventricular tachyarrhythmia ([name]) 06/03/2022   Lung cancer ([name]) 10/14/2021   Chronic combined systolic and diastolic heart failure ([name]) 10/14/2021   Anemia 10/14/2021   Generalized weakness  10/13/2021   V tach ([name]) 10/11/2020   Abdominal pain 10/11/2020   Ventricular tachycardia ([name]) 10/11/2020   Malnutrition of moderate degree 06/04/2020   Defibrillator discharge 04/22/2019   Goals of care, counseling/discussion 08/08/2017   Anemia, iron deficiency 08/08/2017   NSVT (nonsustained ventricular tachycardia) ([name]) 03/05/2017   AAA (abdominal aortic aneurysm) ([name]) 03/03/2017   Low back pain 01/01/2017   Adrenal mass ([name]) 11/24/2016    ICD (implantable cardioverter-defibrillator) lead failure 07/16/2016   Paroxysmal atrial fibrillation ([name]) 06/26/2015   Dyspnea 04/10/2015   Acute respiratory failure ([name]) 03/28/2015   Essential hypertension 03/28/2015   Systolic CHF ([name]) 03/28/2015   Pyrexia    Shortness of breath    ICD (implantable cardioverter-defibrillator) in place 06/08/2010   Acute on chronic systolic heart failure ([name]) 04/18/2010   WEIGHT LOSS 11/01/2008   TOBACCO ABUSE 09/06/2008   Aneurysm of abdominal vessel (5.6 CM) 09/06/2008   HLD (hyperlipidemia) 09/05/2008   COUGH 04/08/2007   PSA, INCREASED 04/08/2007   OTHER ABNORMAL BLOOD CHEMISTRY 03/24/2007   Adrenal nodule ([name]) 10/01/2006   Anxiety state 10/01/2006   HYPERTENSION 10/01/2006   CAD (coronary artery disease) 10/01/2006   Cardiomyopathy, ischemic 10/01/2006   COPD, severe ([name]) 10/01/2006   HYPERGLYCEMIA 10/01/2006   HEMATURIA, HX OF 10/01/2006   PCP:  [name], MD Pharmacy:   [id number] [address] [place] - [address] [place] [address] Phone: [phone] Fax: [phone]  [facility] [address] [place] - [address] [place] [address] Phone: [phone] Fax: [phone]  [facility] [place], [place] - [address] [place] [address] Phone: [phone] Fax: [phone]  [facility] [address] [place] [address] Phone: [phone] Fax: [phone]     Social Determinants of Health (SDOH) Social History: SDOH Screenings   Food Insecurity: No Food Insecurity (10/29/2022)  Housing: Low Risk  (10/30/2022)  Recent Concern: Housing - Medium Risk (10/29/2022)  Transportation Needs: No Transportation Needs (10/30/2022)  Utilities: Not At Risk (10/29/2022)  Alcohol Screen: Low Risk  (10/30/2022)  Financial Resource Strain: Low  Risk  (10/30/2022)  Social Connections: Unknown (06/19/2021)   Received from [facility]  Tobacco Use: High Risk (10/29/2022)   SDOH Interventions: Housing Interventions: Intervention Not Indicated Transportation Interventions: Intervention Not Indicated Alcohol Usage Interventions: Intervention Not Indicated (Score <7) Financial Strain Interventions: Intervention Not Indicated   Readmission Risk Interventions     No data to display

## 2022-10-30 NOTE — Progress Notes (Addendum)
FMTS Interim Progress Note  S: Went to assess patient at bedside after message that he was nauseous and vomited x 1.  Patient states he is no longer nauseous-states that he got nauseous when he was drinking his Ensure.  He states that maybe his breathing was contributing to this as well.  He has been coughing up sputum but denies any hemoptysis.  He denies any kind of chest pain.  He states that his breathing is similar to prior.  He denies any abdominal pain.  He does state that he has been having some looser stools today but no diarrhea.  O: BP 135/63 (BP Location: Right Arm)   Pulse 99   Temp 97.7 F (36.5 C) (Oral)   Resp (!) 24   Ht 5\' 10"  (1.778 m)   Wt 57.9 kg   SpO2 93%   BMI 18.32 kg/m    General: NAD, awake, alert, responsive to questions Head: Normocephalic atraumatic CV: Regular rate  Respiratory:  chest rises symmetrically,  mild diminished in anterior lung fields, saturating in mid 90s on 8L HFNC Abdomen: Soft, non-tender, non-distended Extremities: Moves upper and lower extremities freely, no edema in LE  A/P: Nausea Unclear cause. Denies any chest pain, benign abdominal exam. Echo does show similar EF as prior 25 to 30% with severely decreased function of LV (improved from prior study)--could not assess LV apex well, G1DD.  Does have left ventricular regional wall motion abnormalities however this was noted on last echocardiogram in April as well.  Valves otherwise look good.  Right atrial pressure of 3. EKG similar to previous, Qtc still elevated, chronic RBBB. If worsening nausea or develops chest pain will consider obtaining troponin as well. Less liekly intrabdominal process (obstruction given soft abdominal exam). Is having some loose stools and nausea ? virus but does not have fever.  -Entresto changed to spiro/metop d/t hx of DBP in 40s per HF pharmacist -Hold off on antiemetics for now -Monitor respiratory status and nausea closely -Consider troponin/repeat  EKG if nausea worsens or develops new symptoms -Low threshold to involve cardiology/HF team   Levin Erp, MD 10/30/2022, 4:48 PM PGY-3, [facility] Family Medicine Service pager [phone]

## 2022-10-30 NOTE — Assessment & Plan Note (Addendum)
Patient presented with acute shortness of breath and hypoxia to 80s, placed on BiPAP in ED initially. GDMT for HF discontinued in 07/2022 (including Entresto, home Lasix 20 mg, and Aldactone) 2/2 hypotension. BNP elevated to 2.4K, CXR consistent with pulmonary edema. ProCal 0.81, RPP  negative, COVID negative, MRSA nares negative. CT PE with superimposed airspace disease concerning for PNA, however clinically, low suspicion for PNA given no infectious symptoms, afebrile, with downtrending white count (14 > 13). Negative for PE. S/P Lasix IV 40 mg x 2 with improvement in respiratory status, now on 4L , satting in 90s. Etiology likely CHF exacerbation with volume overload, less concern for bacterial PNA at this time.  - Follow up Echo - Follow up Blood Cultures - STOP IV Abx (Azithromycin and Rocephin) - Wean oxygen as tolerated  - Consider BiPAP at night if needed for respiratory support - Will order Lasix 40 mg IV x 2 today (dose at 1200, 1800) - AM BMP - PT/OT - Orthostatics prior to discharge

## 2022-10-31 ENCOUNTER — Encounter (HOSPITAL_COMMUNITY): Payer: Self-pay | Admitting: Family Medicine

## 2022-10-31 DIAGNOSIS — J9601 Acute respiratory failure with hypoxia: Secondary | ICD-10-CM | POA: Diagnosis not present

## 2022-10-31 DIAGNOSIS — R9431 Abnormal electrocardiogram [ECG] [EKG]: Secondary | ICD-10-CM | POA: Insufficient documentation

## 2022-10-31 DIAGNOSIS — J189 Pneumonia, unspecified organism: Secondary | ICD-10-CM | POA: Diagnosis present

## 2022-10-31 DIAGNOSIS — I5023 Acute on chronic systolic (congestive) heart failure: Secondary | ICD-10-CM | POA: Diagnosis not present

## 2022-10-31 LAB — BASIC METABOLIC PANEL
Anion gap: 11 (ref 5–15)
Anion gap: 12 (ref 5–15)
BUN: 19 mg/dL (ref 8–23)
BUN: 16 mg/dL (ref 8–23)
CO2: 28 mmol/L (ref 22–32)
CO2: 27 mmol/L (ref 22–32)
Calcium: 8.2 mg/dL — ABNORMAL LOW (ref 8.9–10.3)
Calcium: 8.2 mg/dL — ABNORMAL LOW (ref 8.9–10.3)
Chloride: 100 mmol/L (ref 98–111)
Chloride: 99 mmol/L (ref 98–111)
Creatinine, Ser: 1.18 mg/dL (ref 0.61–1.24)
Creatinine, Ser: 1.12 mg/dL (ref 0.61–1.24)
GFR, Estimated: >60 mL/min (ref >60)
GFR, Estimated: >60 mL/min (ref >60)
Glucose, Bld: 137 mg/dL — ABNORMAL HIGH (ref 70–99)
Glucose, Bld: 127 mg/dL — ABNORMAL HIGH (ref 70–99)
Potassium: 3.2 mmol/L — ABNORMAL LOW (ref 3.5–5.1)
Potassium: 3.3 mmol/L — ABNORMAL LOW (ref 3.5–5.1)
Sodium: 139 mmol/L (ref 135–145)
Sodium: 138 mmol/L (ref 135–145)

## 2022-10-31 LAB — GLUCOSE, CAPILLARY: Glucose-Capillary: 108 mg/dL — ABNORMAL HIGH (ref 70–99)

## 2022-10-31 LAB — CULTURE, BLOOD (ROUTINE X 2): Special Requests: ADEQUATE

## 2022-10-31 LAB — CBC
HCT: 35.8 % — ABNORMAL LOW (ref 39.0–52.0)
Hemoglobin: 11.7 g/dL — ABNORMAL LOW (ref 13.0–17.0)
MCH: 30.7 pg (ref 26.0–34.0)
MCHC: 32.7 g/dL (ref 30.0–36.0)
MCV: 94 fL (ref 80.0–100.0)
Platelets: 179 10*3/uL (ref 150–400)
RBC: 3.81 MIL/uL — ABNORMAL LOW (ref 4.22–5.81)
RDW: 18.2 % — ABNORMAL HIGH (ref 11.5–15.5)
WBC: 12.9 10*3/uL — ABNORMAL HIGH (ref 4.0–10.5)
nRBC: 0 % (ref 0.0–0.2)

## 2022-10-31 LAB — MAGNESIUM
Magnesium: 1.6 mg/dL — ABNORMAL LOW (ref 1.7–2.4)
Magnesium: 2.1 mg/dL (ref 1.7–2.4)

## 2022-10-31 LAB — TROPONIN I (HIGH SENSITIVITY)
Troponin I (High Sensitivity): 67 ng/L — ABNORMAL HIGH (ref <18)
Troponin I (High Sensitivity): 62 ng/L — ABNORMAL HIGH (ref <18)

## 2022-10-31 MED ORDER — POTASSIUM CHLORIDE 20 MEQ PO PACK
60.0000 meq | PACK | Freq: Once | ORAL | Status: AC
  Administered 2022-10-31: 60 meq via ORAL
  Filled 2022-10-31: qty 3

## 2022-10-31 MED ORDER — MAGNESIUM SULFATE 2 GM/50ML IV SOLN
2.0000 g | Freq: Once | INTRAVENOUS | Status: AC
  Administered 2022-10-31: 2 g via INTRAVENOUS
  Filled 2022-10-31: qty 50

## 2022-10-31 MED ORDER — POTASSIUM CHLORIDE 10 MEQ/100ML IV SOLN
10.0000 meq | INTRAVENOUS | Status: AC
  Administered 2022-10-31 (×2): 10 meq via INTRAVENOUS
  Filled 2022-10-31 (×2): qty 100

## 2022-10-31 MED ORDER — FUROSEMIDE 10 MG/ML IJ SOLN
60.0000 mg | Freq: Once | INTRAMUSCULAR | Status: AC
  Administered 2022-10-31: 60 mg via INTRAVENOUS
  Filled 2022-10-31: qty 6

## 2022-10-31 MED ORDER — SACUBITRIL-VALSARTAN 24-26 MG PO TABS
1.0000 | ORAL_TABLET | Freq: Two times a day (BID) | ORAL | Status: DC
  Administered 2022-10-31 – 2022-11-05 (×10): 1 via ORAL
  Filled 2022-10-31 (×10): qty 1

## 2022-10-31 MED ORDER — POTASSIUM CHLORIDE 20 MEQ PO PACK
40.0000 meq | PACK | Freq: Once | ORAL | Status: AC
  Administered 2022-10-31: 40 meq via ORAL
  Filled 2022-10-31: qty 2

## 2022-10-31 NOTE — Progress Notes (Addendum)
Daily Progress Note Intern Pager: [phone]  Patient name: Benjamin Cook Medical record number: [id number] Date of birth: 1945/05/22 Age: 77 y.o. Gender: male  Primary Care Provider: [name], MD Consultants: Oncology Code Status: Full Code  Pt Overview and Major Events to Date:  9/24: Admitted to FMTS 9/25: Weaned to 4L [place] 9/25 PM: Put on BiPAP overnight   Assessment and Plan:  Benjamin Cook is a 77 y.o. male with a past medical history of Stage 3 NSCLC (currently on immunotherapy with Durvalumab), CHF (EF 20-25% per Echo 05/2022), Afib on Xarelto, COPD, CAD, and HTN presenting here with acute hypoxic respiratory failure in the setting of likely CHF exacerbation.  Assessment & Plan Acute hypoxic respiratory failure ([name]) Patient presented with acute shortness of breath and hypoxia to 80s, placed on BiPAP in ED initially. GDMT for HF discontinued in 07/2022 (including Entresto, home Lasix 20 mg, and Aldactone) 2/2 hypotension. BNP elevated to 2.4K. Was weaned to 4L [place], received Lasix 40 mg IV x 2 on 9/25. Overnight, patient became hypoxic on ambulation to 80s, placed back on BiPAP. Now on 15L [place], satting in 90s, with conversational dyspnea. Repeat CXR with evidence of PNA, repeat EKG with no evidence of acute ischemia, but preventricular excitation. Denies any chest pain this AM. Echo with LVEF of 25% similar to prior, with regional wall motion abnormalities. Etiology of persistent hypoxia is likely CHF exacerbation, however, mixed picture given concurrent PNA and underlying interstitial lung disease and malignancy. Patient also not does appear significantly fluid overloaded on exam. Output of 1.4L in last 24 hours. Will continue diuresis, but given comorbidities and advanced heart failure, will consult Cardiology.  - Consult Cardiology, appreciate recommendations - Wean oxygen as tolerated, BiPAP as needed for respiratory support - Lasix 60 mg IV - PM BMP and Mg -  Continue IV Abx (Azithromycin, Rocephin) - PT/OT - Orthostatics prior to discharge  Lung cancer [facility]) Patient with diagnosis of Stage III NSCLC diagnosed on 06/03/2022 (followed by Dr. Bertis Ruddy), with 4.1 cm R hilar lesion with bilateral bronchial thickening. No evidence of metastatic disease per PET in 06/2022. Currently on chemotherapy with Durvalumab q28d, last dose on 10/01/2022. No recent weight loss, hemoptysis. No HA, abdominal pain, vomiting, diarrhea. Consulted Oncology, less concern for pneumonitis related to immunotherapy, they recommend outpatient immunotherapy, no further workup/recommendations for inpatient stay.  - Tylenol PRN for pain  Acute on chronic systolic heart failure [facility]) Patient with chronic congestive heart failure (EF of 20-25% as of 05/2022). BNP elevated markedly and CXR with pulmonary edema on presentation, but appears dry on exam with cold extremities. Weaned to 4L  on 9/25, received Lasix IV 40 mg x 2 on 9/25. Overnight 9/25, became hypoxic on ambulation, placed back on BiPAP overnight 9/25. This AM, on 15L , satting in 90s. Conversational dyspnea, but no upper accessory muscle usage. Lungs with no wheezing. Foley placed overnight, Output of 1.4L in last 24 hours. Cr uptrending to 1.18, K of 3.2, Mg 1.6.  - Continue diuresis with Lasix 60 mg IV - Strict I/Os - 20 meQ IV K (2/2 nausea) - IV Mg 2g - Goal Mg > 2, Goal K > 4 - PM BMP and Mg to monitor after repletion - Per HF Pharmacy recommendations, restart home metoprolol 25 mg BID and Aldactone 25 mg daily. HOLD home Entresto due to soft diastolic blood pressures.  Pneumonia Patient with subjective fevers and cough at home, afebrile on presentation. Initial CXR  on admission with no evidence of PNA. ProCal 0.81, RPP  negative, COVID negative, MRSA nares negative. Abx initiated on presentation, but discontinued on 9/25. Overnight, patient became hypoxic on ambulation, repeat CXR with evidence of PNA. Patient febrile  overnight to 101F as well, Abx restarted overnight. WBC 12.9 today. Suspect that PNA is not primary cause of hypoxia, but given that patient was febrile and CXR with evidence of PNA will continue IV Abx at this time. Less concern for aspiration PNA given no productive cough/purulent sputum. Blood cultures with no growth at 24 hours.  - Continue IV Abx (Azithromycin and Rocephin) - Follow up blood cultures  COPD, severe ([name]) Patient with long-standing COPD, home regimen includes Dulera and rescue albuterol inhaler. No increase in cough or sputum production/quality. No wheezing noted on lung exam.  - DuoNebs PRN - Continue home Dulera Paroxysmal atrial fibrillation [facility]) Patient with history of paroxysmal atrial fibrillation, has had ICD placement in 2016. Currently controlled on amiodarone 200 mg BID as well as Xarelto 20 mg daily. Sinus rhythm on presentation, tachycardic to 100, but now HR in 80s. Overnight, with nausea and became hypoxic when ambulating. Repeat EKG with sinus rhythm and stable rate, but ventricular pre-excitation. Trops uptrended to 60, but now flat and downtrending (63 > 67 > 62). No acute ischemic changes. Patient with no chest pain this morning.  - Continue home Amiodarone 200 mg BID - Continue Xarelto 15 mg daily, due to CrCl < 50 - EKG in AM - Cardiac monitoring - Cardiology consulted, appreciate their recommendations regarding medication management  Anemia Hgb 13.9 on admission, downtrended to 11.3 9/25, stable this morning with Hgb of 11.7. Patient asymptomatic with no signs or symptoms consistent with active bleed. Will continue to monitor.  - AM CBC    Chronic and Stable Issues:  Hyperlipidemia: Continue Atorvastatin 80 mg daily  Constipation: Continue Senna 2 tabs daily  GERD: Continue PO Protonix 40 mg daily  Insomnia: Continue Melatonin 5 mg nightly  FEN/GI: Heart Healthy Diet PPx: Xarelto Dispo: Home pending clinical improvement  Subjective:  Patient is  feeling improved from last night, but still feeling fatigued and somewhat weak. Denies any chest pain or palpitations, states he still feels somewhat short of breath. No leg swelling, leg pain, abdominal pain. No HA, weakness, numbness, tingling.   Objective: Temp:  [97.7 F (36.5 C)-101.5 F (38.6 C)] 98.6 F (37 C) (09/26 1135) Pulse Rate:  [83-111] 92 (09/26 1135) Resp:  [19-41] 19 (09/26 1135) BP: (105-179)/(58-81) 131/70 (09/26 1135) SpO2:  [87 %-100 %] 100 % (09/26 1135) FiO2 (%):  [40 %-100 %] 40 % (09/26 0356) Weight:  [60 kg] 60 kg (09/26 0609) Physical Exam: General: Non toxic, but frail appearing. Alert and oriented. Cardiovascular: Heart rate and rhythm regular, No murmurs, rubs, gallops.  Respiratory: Lungs with no focal wheezing, rales, rhonchi. Poor air movement throughout. Has conversational dyspnea but no upper accessory muscle usage with breathing. Currently on 15 L [place] high flow.  Abdomen: Non-tender with no rebound or guarding. Somewhat firm and mildly distended, but not tympanitic.  Extremities: Cool hands and feet, but palpable distal pulses 2+. No lower extremity edema.   Laboratory: Most recent CBC Lab Results  Component Value Date   WBC 12.9 (H) 10/31/2022   HGB 11.7 (L) 10/31/2022   HCT 35.8 (L) 10/31/2022   MCV 94.0 10/31/2022   PLT 179 10/31/2022   Most recent BMP    Latest Ref Rng & Units 10/31/2022  2:15 AM  BMP  Glucose 70 - 99 mg/dL 161   BUN 8 - 23 mg/dL 19   Creatinine 0.96 - 1.24 mg/dL 0.45   Sodium 409 - 811 mmol/L 139   Potassium 3.5 - 5.1 mmol/L 3.2   Chloride 98 - 111 mmol/L 100   CO2 22 - 32 mmol/L 28   Calcium 8.9 - 10.3 mg/dL 8.2    Other Pertinent Labs:   ProCal = 0.81 MRSA nares: negative COVID = negative RPP: negative Lactate: 1.5 BNP (9/24): 2.5K VBG: pCO2 of 43.6, pH 7.33  Troponin: 31 > 33 > 38 > 50 > 63 > 67 > 62  INR: 4.3 Mg: 1.6   Blood Cultures (9/24): No growth at 24 hours   Imaging/Diagnostic  Tests:  CXR 9/24: Increased interstitial markings, concerning for superimposed pulmonary edema. CXR 9/26: New airspace disease in L mid lung, R mid lung infiltrate decreased in density but increased in size.    CT PE: No acute pulmonary embolus. Suspected underlying radiation fibrosis, suspected interval development of airspace disease suspicious for superimposed PNA.    [place], Medical Student 10/31/2022, 1:36 PM  PGY-, [facility] Family Medicine FPTS Intern pager: [phone], text pages welcome Secure chat group [facility] Teaching Service

## 2022-10-31 NOTE — Assessment & Plan Note (Addendum)
Hgb 13.9 on admission, downtrended to 11.3 9/25, stable this morning with Hgb of 11.7. Patient asymptomatic with no signs or symptoms consistent with active bleed. Will continue to monitor.  - AM CBC

## 2022-10-31 NOTE — Assessment & Plan Note (Addendum)
Patient with diagnosis of Stage III NSCLC diagnosed on 06/03/2022 (followed by Dr. Bertis Ruddy), with 4.1 cm R hilar lesion with bilateral bronchial thickening. No evidence of metastatic disease per PET in 06/2022. Currently on chemotherapy with Durvalumab q28d, last dose on 10/01/2022. No recent weight loss, hemoptysis. No HA, abdominal pain, vomiting, diarrhea. Consulted Oncology, less concern for pneumonitis related to immunotherapy, they recommend outpatient immunotherapy, no further workup/recommendations for inpatient stay.  - Tylenol PRN for pain

## 2022-10-31 NOTE — Assessment & Plan Note (Addendum)
Patient with chronic congestive heart failure (EF of 20-25% as of 05/2022). BNP elevated markedly and CXR with pulmonary edema on presentation, but appears dry on exam with cold extremities. Weaned to 4L [place] on 9/25, received Lasix IV 40 mg x 2 on 9/25. Overnight 9/25, became hypoxic on ambulation, placed back on BiPAP overnight 9/25. This AM, on 15L [place], satting in 90s. Conversational dyspnea, but no upper accessory muscle usage. Lungs with no wheezing. Foley placed overnight, Output of 1.4L in last 24 hours. Cr uptrending to 1.18, K of 3.2, Mg 1.6.  - Continue diuresis with Lasix 60 mg IV - Strict I/Os - 20 meQ IV K (2/2 nausea) - IV Mg 2g - Goal Mg > 2, Goal K > 4 - PM BMP and Mg to monitor after repletion - Per HF Pharmacy recommendations, restart home metoprolol 25 mg BID and Aldactone 25 mg daily. HOLD home Entresto due to soft diastolic blood pressures.

## 2022-10-31 NOTE — Assessment & Plan Note (Addendum)
Patient with long-standing COPD, home regimen includes Dulera and rescue albuterol inhaler. No increase in cough or sputum production/quality. No wheezing noted on lung exam.  - DuoNebs PRN - Continue home [facility]

## 2022-10-31 NOTE — Progress Notes (Signed)
Patient alerted nursing staff that he wants to eat. RN rounded on patient whom currently on BIPAP with wife at bedside. Oxygen sats trending 86-88% talking to RN, 90% at rest. RN educated patient and patient's wife on aspiration risk and verbalize understanding. BIPAP continued.

## 2022-10-31 NOTE — Assessment & Plan Note (Addendum)
Patient with subjective fevers and cough at home, afebrile on presentation. Initial CXR on admission with no evidence of PNA. ProCal 0.81, RPP  negative, COVID negative, MRSA nares negative. Abx initiated on presentation, but discontinued on 9/25. Overnight, patient became hypoxic on ambulation, repeat CXR with evidence of PNA. Patient febrile overnight to 101F as well, Abx restarted overnight. WBC 12.9 today. Suspect that PNA is not primary cause of hypoxia, but given that patient was febrile and CXR with evidence of PNA will continue IV Abx at this time. Less concern for aspiration PNA given no productive cough/purulent sputum. Blood cultures with no growth at 24 hours.  - Continue IV Abx (Azithromycin and Rocephin) - Follow up blood cultures

## 2022-10-31 NOTE — Progress Notes (Addendum)
FMTS Brief Progress Note  S: Went bedside with Dr. Janann August to evaluate patient.  Patient comfortable sitting in bed on BiPAP.  Patient conversant, explaining events that led to him going back on BiPAP.   O: BP (!) 127/58 (BP Location: Left Arm)   Pulse 90   Temp 100 F (37.8 C) (Axillary)   Resp (!) 22   Ht 5\' 10"  (1.778 m)   Wt 57.9 kg   SpO2 94%   BMI 18.32 kg/m   General: NAD, Frail, elderly, male, conversant, good mood Resp: Increased WOB, tachypneic, breath sounds covered by BIPAP  A/P: Dyspnea Patient noted to have dyspnea today after getting out of bed to go to the bathroom.  Patient has history of emphysema, here for CHF exacerbation, receiving diuresis.  Patient appears to be doing well after diuresis, and while being on BiPAP.  Although patient has increased work of breathing, this is thought to be secondary to him trying to talk while on BiPAP.  Patient Trops slightly elevated.  Will continue to monitor and trend labs. - CTM - Follow-up troponin - Redose Lasix, 60 mg, at midnight - Continue BiPAP - Reassess later in the evening  - Orders reviewed. Labs for AM ordered, which was adjusted as needed.   ~ 1150 pm check  S: Went bedside to check on patient.  Patient lying comfortably in bed.  Patient with comfortable work of breathing, reports no complaints.  Patient requesting to drink some water, but was told this could not be done while on BiPAP.    O: RR 20, O2 sat > 95%, comfortable WOB, NAD  AP: Will plan to continue patient on BiPAP overnight. Trops continue to rise slightly, will trend. (50>63>67). -F/u Trop -Continue BiPAP    Bess Kinds, MD 10/31/2022, 1:38 AM PGY-3, [facility] Night Resident  Please page [phone] with questions.

## 2022-10-31 NOTE — Progress Notes (Signed)
FMTS Brief Progress Note  S:Went bedside with Dr. Weston Settle to see patient. Patient laying comfortably in bed, no complaints. Report's he had a good day.    O: BP 123/61 (BP Location: Left Arm)   Pulse 86   Temp 99.2 F (37.3 C) (Oral)   Resp 17   Ht 5\' 10"  (1.778 m)   Wt 60 kg   SpO2 100%   BMI 18.98 kg/m   General: Frail, elderly, NAD Resp: Coarse breath sounds, no wheezes, comfortable WOB Ext: No pitting edema  A/P: CHF exacerbation Patient doing well, reports he had a good day.  Patient has improved since this morning, where he was on BiPAP, but is now on 13 L high flow nasal cannula, breathing comfortably.  Will continue patient on high flow, and wean as tolerated. - Plans per day team - Orders reviewed. Labs for AM ordered, which was adjusted as needed.   Bess Kinds, MD 10/31/2022, 8:54 PM PGY-3, [facility] Night Resident  Please page [phone] with questions.

## 2022-10-31 NOTE — Progress Notes (Addendum)
   Heart Failure Stewardship Pharmacist Progress Note   PCP: [name], MD PCP-Cardiologist: Olga Millers, MD    HPI:  77 yo M with PMH of CHF, afib, COPD, CAD, HTN, and stage 3 NSCLC.  Cardiac history dates back to June of 2001. At that time the patient was found to have an abnormal EKG. Catheterization revealed a 95% mid LAD and a 70% diffuse right coronary artery and underwent PCI of LAD. His EF was 25-30%.   Presented to the ED on 9/24 with shortness of breath for 2 months. CXR with concern for pulmonary edema. CTA with cardiomegaly, concern for PNA, moderate R and small L pleural effusions, and negative for PE. BNP 2456.6. ECHO 9/25 showed LVEF 25-30% (last EF 20-25%), RWMA, G1DD, RV normal.    Current HF Medications: Diuretic: furosemide 60 mg IV x 1 Beta Blocker: metoprolol XL 25 mg BID MRA: spironolactone 25 mg daily  Prior to admission HF Medications: Beta blocker: metoprolol XL 50 mg BID MRA: spironolactone 25 mg daily *not taking lasix or Entresto due to hypotension  Pertinent Lab Values: Serum creatinine 1.18, BUN 19, Potassium 3.2, Sodium 139, BNP 2456.6, Magnesium 1.6  Vital Signs: Weight: 132 lbs (admission weight: 134 lbs) Blood pressure: 130-140/70s  Heart rate: 80s  I/O: net -2.4L since admission  Medication Assistance / Insurance Benefits Check: Does the patient have prescription insurance?  Yes Type of insurance plan: [employer]ia Pouch Medicare  Does the patient qualify for medication assistance through manufacturers or grants?   Pending Eligible grants and/or patient assistance programs: pending Medication assistance applications in progress: none  Medication assistance applications approved: none Approved medication assistance renewals will be completed by: pending  Outpatient Pharmacy:  Prior to admission outpatient [facility] Is the patient willing to use [facility] pharmacy at discharge? Yes Is the patient willing to transition their outpatient  pharmacy to utilize a [facility] outpatient pharmacy?   No    Assessment: 1. Acute on chronic systolic CHF (LVEF 20-25%), due to ICM. NYHA class II symptoms. - Furosemide 60 mg IV x 1. Strict I/Os and daily weights. Keep K>4 and Mg>2. KCl 10 mEq IV x 2 and 40 mEq PO x 1 + magnesium 2g IV x 1 given for replacement.  - Continue metoprolol XL 25 mg BID - Stopped Entresto 2/2 hypotension back in June - Continue spironolactone 25 mg daily - Consider restarting Jardiance 10 mg daily. His wife said he was only on this for a short time before it was discontinued. It appears it was discontinued from med list 02/08/22. She does not recall any issues that he had that led to this being discontinued.    Plan: 1) Medication changes recommended at this time: - Start Jardiance 10 mg daily  2) Patient assistance: Sherryll Burger copay $47 - Farxiga/Jardiance copay $47  3)  Education  - Patient has been educated on current HF medications and potential additions to HF medication regimen - Patient verbalizes understanding that over the next few months, these medication doses may change and more medications may be added to optimize HF regimen - Patient has been educated on basic disease state pathophysiology and goals of therapy   Sharen Hones, PharmD, BCPS Heart Failure Stewardship Pharmacist Phone [phone]

## 2022-10-31 NOTE — Consult Note (Addendum)
Cardiology Consultation   Patient ID: Benjamin Cook MRN: [id number]; DOB: 26-May-1945  Admit date: 10/29/2022 Date of Consult: 10/31/2022  PCP:  [name], MD   [facility] Providers Cardiologist:  Olga Millers, MD  Electrophysiologist:  [name], MD       Patient Profile:   Benjamin Cook is a 77 y.o. male with a hx of CAD, ICM/chronic HFrEF s/p STJ ICD, VT (s/p ablation 01/2021 with recurrence), PAF, AAA s/p EVAR 2019 previously managed by vascular surgery, lung CA, anxiety, arthritis, HTN, RBBB, severe COPD who is being seen 10/31/2022 for the evaluation of CHF at the request of Dr. Meredith Pel.  History of Present Illness:   [name] has complex cardiac history as above dating back to 2001 with abnormal EKG prompting cath with subsequent PCI to LAD with residual disease managed medically. Last cath 10/2020 showed 70% mCx, 75% distal LM (clarified in body of impression), 70% mRCA, 50% prox-mid RCA, patent LAD stent, manged medically. He also has longstanding hx of ICM/low EF and underwent ICD implantation remotely for primary prevention though has a history of VT. He had VT ablation in 2022 but then developed recurrence. This was treated with amiodarone, later paused in setting of elevated ESR, then resumed after having recurrent VT in 05/2022. He was also found to have lung mass that admission and is followed by oncology for diagnosis of stage 3A NSCLC. He sees Dr. Bertis Ruddy with oncology, was treated with carboplatin + Paclitaxel + XRT q7d then subsequent immunotherapy with durvulamab. Regarding vascular disease, aortic duplex in 06/2022 also showed distal portion of occluded aneurysmal sac measures 6.1 cm, cannot exclude endoleak - recommended for vascular surgery follow-up as he had last seen them in 2019 - this is scheduled for 01/2023 with appt notes indicating plan to defer f/u for 6 more months given cancer treatment.    He was admitted 10/29/22 with  increasing SOB over the past 1 month. He also had had subjective fevers at home. He was afebrile in ED, but did develop fever overnight to 101.5. In ED he was also tachycardic, tachypneic and hypoxic requiring NRB->BiPAP. He had reported that the patient was told to stop several of his medications in June 2024 due to hypotension so had not been taking his Entresto, furosemide, and spironolactone for the last 2-3 months. Phone note 07/30/22 recommended decrease Entresto to 24/26mg  BID and stop Imdur. Do not see notation to stop the above meds completely. 2D echo shows EF 25-30% (prev 20-25%), G1DD, elevated LVEDP.  Covid/RVP neg. BNP 2456. hsTroponins have been trended x 3 days at 31-33-38-50-63-67, remaining low. Initial CXR showed small right effusion, increased interstitial markings concerning for pulm edema, large right hilar mass. 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 F/u CXR showed increased R mid lung infiltrate and new airspace disease in the L lung. He has received multiple doses of IV Lasix this admission. Cardiology asked to consult regarding CHF. He unfortunately vomited several of his medicines this morning per notes. I/O's -2.3L, weights quite variable 134->128->127->132, prior OP dry weight 130lb. Blood cx NTD.  Past Medical History:  Diagnosis Date   AAA (abdominal aortic aneurysm) ([name])    Adrenal mass ([name])    per pt this is remote (10 years) and  Cardiology Consultation   Patient ID: Benjamin Cook MRN: [id number]; DOB: 26-May-1945  Admit date: 10/29/2022 Date of Consult: 10/31/2022  PCP:  [name], MD   [facility] Providers Cardiologist:  Olga Millers, MD  Electrophysiologist:  [name], MD       Patient Profile:   Benjamin Cook is a 77 y.o. male with a hx of CAD, ICM/chronic HFrEF s/p STJ ICD, VT (s/p ablation 01/2021 with recurrence), PAF, AAA s/p EVAR 2019 previously managed by vascular surgery, lung CA, anxiety, arthritis, HTN, RBBB, severe COPD who is being seen 10/31/2022 for the evaluation of CHF at the request of Dr. Meredith Pel.  History of Present Illness:   [name] has complex cardiac history as above dating back to 2001 with abnormal EKG prompting cath with subsequent PCI to LAD with residual disease managed medically. Last cath 10/2020 showed 70% mCx, 75% distal LM (clarified in body of impression), 70% mRCA, 50% prox-mid RCA, patent LAD stent, manged medically. He also has longstanding hx of ICM/low EF and underwent ICD implantation remotely for primary prevention though has a history of VT. He had VT ablation in 2022 but then developed recurrence. This was treated with amiodarone, later paused in setting of elevated ESR, then resumed after having recurrent VT in 05/2022. He was also found to have lung mass that admission and is followed by oncology for diagnosis of stage 3A NSCLC. He sees Dr. Bertis Ruddy with oncology, was treated with carboplatin + Paclitaxel + XRT q7d then subsequent immunotherapy with durvulamab. Regarding vascular disease, aortic duplex in 06/2022 also showed distal portion of occluded aneurysmal sac measures 6.1 cm, cannot exclude endoleak - recommended for vascular surgery follow-up as he had last seen them in 2019 - this is scheduled for 01/2023 with appt notes indicating plan to defer f/u for 6 more months given cancer treatment.    He was admitted 10/29/22 with  increasing SOB over the past 1 month. He also had had subjective fevers at home. He was afebrile in ED, but did develop fever overnight to 101.5. In ED he was also tachycardic, tachypneic and hypoxic requiring NRB->BiPAP. He had reported that the patient was told to stop several of his medications in June 2024 due to hypotension so had not been taking his Entresto, furosemide, and spironolactone for the last 2-3 months. Phone note 07/30/22 recommended decrease Entresto to 24/26mg  BID and stop Imdur. Do not see notation to stop the above meds completely. 2D echo shows EF 25-30% (prev 20-25%), G1DD, elevated LVEDP.  Covid/RVP neg. BNP 2456. hsTroponins have been trended x 3 days at 31-33-38-50-63-67, remaining low. Initial CXR showed small right effusion, increased interstitial markings concerning for pulm edema, large right hilar mass. 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 F/u CXR showed increased R mid lung infiltrate and new airspace disease in the L lung. He has received multiple doses of IV Lasix this admission. Cardiology asked to consult regarding CHF. He unfortunately vomited several of his medicines this morning per notes. I/O's -2.3L, weights quite variable 134->128->127->132, prior OP dry weight 130lb. Blood cx NTD.  Past Medical History:  Diagnosis Date   AAA (abdominal aortic aneurysm) ([name])    Adrenal mass ([name])    per pt this is remote (10 years) and  Cardiology Consultation   Patient ID: Benjamin Cook MRN: [id number]; DOB: 26-May-1945  Admit date: 10/29/2022 Date of Consult: 10/31/2022  PCP:  [name], MD   [facility] Providers Cardiologist:  Olga Millers, MD  Electrophysiologist:  [name], MD       Patient Profile:   Benjamin Cook is a 77 y.o. male with a hx of CAD, ICM/chronic HFrEF s/p STJ ICD, VT (s/p ablation 01/2021 with recurrence), PAF, AAA s/p EVAR 2019 previously managed by vascular surgery, lung CA, anxiety, arthritis, HTN, RBBB, severe COPD who is being seen 10/31/2022 for the evaluation of CHF at the request of Dr. Meredith Pel.  History of Present Illness:   [name] has complex cardiac history as above dating back to 2001 with abnormal EKG prompting cath with subsequent PCI to LAD with residual disease managed medically. Last cath 10/2020 showed 70% mCx, 75% distal LM (clarified in body of impression), 70% mRCA, 50% prox-mid RCA, patent LAD stent, manged medically. He also has longstanding hx of ICM/low EF and underwent ICD implantation remotely for primary prevention though has a history of VT. He had VT ablation in 2022 but then developed recurrence. This was treated with amiodarone, later paused in setting of elevated ESR, then resumed after having recurrent VT in 05/2022. He was also found to have lung mass that admission and is followed by oncology for diagnosis of stage 3A NSCLC. He sees Dr. Bertis Ruddy with oncology, was treated with carboplatin + Paclitaxel + XRT q7d then subsequent immunotherapy with durvulamab. Regarding vascular disease, aortic duplex in 06/2022 also showed distal portion of occluded aneurysmal sac measures 6.1 cm, cannot exclude endoleak - recommended for vascular surgery follow-up as he had last seen them in 2019 - this is scheduled for 01/2023 with appt notes indicating plan to defer f/u for 6 more months given cancer treatment.    He was admitted 10/29/22 with  increasing SOB over the past 1 month. He also had had subjective fevers at home. He was afebrile in ED, but did develop fever overnight to 101.5. In ED he was also tachycardic, tachypneic and hypoxic requiring NRB->BiPAP. He had reported that the patient was told to stop several of his medications in June 2024 due to hypotension so had not been taking his Entresto, furosemide, and spironolactone for the last 2-3 months. Phone note 07/30/22 recommended decrease Entresto to 24/26mg  BID and stop Imdur. Do not see notation to stop the above meds completely. 2D echo shows EF 25-30% (prev 20-25%), G1DD, elevated LVEDP.  Covid/RVP neg. BNP 2456. hsTroponins have been trended x 3 days at 31-33-38-50-63-67, remaining low. Initial CXR showed small right effusion, increased interstitial markings concerning for pulm edema, large right hilar mass. 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 F/u CXR showed increased R mid lung infiltrate and new airspace disease in the L lung. He has received multiple doses of IV Lasix this admission. Cardiology asked to consult regarding CHF. He unfortunately vomited several of his medicines this morning per notes. I/O's -2.3L, weights quite variable 134->128->127->132, prior OP dry weight 130lb. Blood cx NTD.  Past Medical History:  Diagnosis Date   AAA (abdominal aortic aneurysm) ([name])    Adrenal mass ([name])    per pt this is remote (10 years) and  Cardiology Consultation   Patient ID: Benjamin Cook MRN: [id number]; DOB: 26-May-1945  Admit date: 10/29/2022 Date of Consult: 10/31/2022  PCP:  [name], MD   [facility] Providers Cardiologist:  Olga Millers, MD  Electrophysiologist:  [name], MD       Patient Profile:   Benjamin Cook is a 77 y.o. male with a hx of CAD, ICM/chronic HFrEF s/p STJ ICD, VT (s/p ablation 01/2021 with recurrence), PAF, AAA s/p EVAR 2019 previously managed by vascular surgery, lung CA, anxiety, arthritis, HTN, RBBB, severe COPD who is being seen 10/31/2022 for the evaluation of CHF at the request of Dr. Meredith Pel.  History of Present Illness:   [name] has complex cardiac history as above dating back to 2001 with abnormal EKG prompting cath with subsequent PCI to LAD with residual disease managed medically. Last cath 10/2020 showed 70% mCx, 75% distal LM (clarified in body of impression), 70% mRCA, 50% prox-mid RCA, patent LAD stent, manged medically. He also has longstanding hx of ICM/low EF and underwent ICD implantation remotely for primary prevention though has a history of VT. He had VT ablation in 2022 but then developed recurrence. This was treated with amiodarone, later paused in setting of elevated ESR, then resumed after having recurrent VT in 05/2022. He was also found to have lung mass that admission and is followed by oncology for diagnosis of stage 3A NSCLC. He sees Dr. Bertis Ruddy with oncology, was treated with carboplatin + Paclitaxel + XRT q7d then subsequent immunotherapy with durvulamab. Regarding vascular disease, aortic duplex in 06/2022 also showed distal portion of occluded aneurysmal sac measures 6.1 cm, cannot exclude endoleak - recommended for vascular surgery follow-up as he had last seen them in 2019 - this is scheduled for 01/2023 with appt notes indicating plan to defer f/u for 6 more months given cancer treatment.    He was admitted 10/29/22 with  increasing SOB over the past 1 month. He also had had subjective fevers at home. He was afebrile in ED, but did develop fever overnight to 101.5. In ED he was also tachycardic, tachypneic and hypoxic requiring NRB->BiPAP. He had reported that the patient was told to stop several of his medications in June 2024 due to hypotension so had not been taking his Entresto, furosemide, and spironolactone for the last 2-3 months. Phone note 07/30/22 recommended decrease Entresto to 24/26mg  BID and stop Imdur. Do not see notation to stop the above meds completely. 2D echo shows EF 25-30% (prev 20-25%), G1DD, elevated LVEDP.  Covid/RVP neg. BNP 2456. hsTroponins have been trended x 3 days at 31-33-38-50-63-67, remaining low. Initial CXR showed small right effusion, increased interstitial markings concerning for pulm edema, large right hilar mass. 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 F/u CXR showed increased R mid lung infiltrate and new airspace disease in the L lung. He has received multiple doses of IV Lasix this admission. Cardiology asked to consult regarding CHF. He unfortunately vomited several of his medicines this morning per notes. I/O's -2.3L, weights quite variable 134->128->127->132, prior OP dry weight 130lb. Blood cx NTD.  Past Medical History:  Diagnosis Date   AAA (abdominal aortic aneurysm) ([name])    Adrenal mass ([name])    per pt this is remote (10 years) and  mmHg.   Laboratory Data:  High Sensitivity Troponin:   Recent Labs  Lab 10/30/22 1733 10/30/22 1942 10/30/22 2204 10/31/22 0007 10/31/22 0215  TROPONINIHS 38* 50* 63* 67* 62*     Chemistry Recent Labs  Lab 10/29/22 0924 10/29/22 0936 10/30/22 0550  10/31/22 0215  NA 135 140 139 139  K 3.6 3.7 3.7 3.2*  CL 102  --  103 100  CO2 21*  --  26 28  GLUCOSE 178*  --  144* 137*  BUN 12  --  16 19  CREATININE 1.05  --  1.10 1.18  CALCIUM 8.3*  --  8.7* 8.2*  MG  --   --  2.0 1.6*  GFRNONAA >60  --  >60 >60  ANIONGAP 12  --  10 11    Recent Labs  Lab 10/29/22 0924  PROT 6.3*  ALBUMIN 2.6*  AST 40  ALT 32  ALKPHOS 69  BILITOT 0.8   Lipids No results for input(s): "CHOL", "TRIG", "HDL", "LABVLDL", "LDLCALC", "CHOLHDL" in the last 168 hours.  Hematology Recent Labs  Lab 10/29/22 0924 10/29/22 0936 10/30/22 0550 10/31/22 0531  WBC 14.0*  --  13.0* 12.9*  RBC 4.08*  --  3.63* 3.81*  HGB 13.1 13.9 11.3* 11.7*  HCT 40.8 41.0 34.3* 35.8*  MCV 100.0  --  94.5 94.0  MCH 32.1  --  31.1 30.7  MCHC 32.1  --  32.9 32.7  RDW 18.3*  --  18.5* 18.2*  PLT 177  --  189 179   Thyroid No results for input(s): "TSH", "FREET4" in the last 168 hours.  BNP Recent Labs  Lab 10/29/22 0924  BNP 2,456.6*    DDimer No results for input(s): "DDIMER" in the last 168 hours.   Radiology/Studies:  DG CHEST PORT 1 VIEW  Result Date: 10/30/2022 CLINICAL DATA:  Dyspnea EXAM: PORTABLE CHEST 1 VIEW COMPARISON:  Chest x-ray 10/29/2022.  Chest CT 10/29/2022. FINDINGS: Right chest port catheter tip projects over the distal SVC. Left-sided pacemaker is present. Right mid lung infiltrate has decreased in density, but has mildly increased in size. There is new airspace disease in the left mid lung. The costophrenic angles are clear. There is no pneumothorax or acute fracture. IMPRESSION: 1. Right mid lung infiltrate has decreased in density, but has mildly increased in size. 2. New airspace disease in the left mid lung. Electronically Signed   By: Darliss Cheney M.D.   On: 10/30/2022 19:20   ECHOCARDIOGRAM COMPLETE  Result Date: 10/30/2022    ECHOCARDIOGRAM REPORT   Patient Name:   Benjamin Cook Date of Exam: 10/30/2022 Medical Rec #:  [id number]          Height:       70.0 in Accession #:    [id number]        Weight:       127.6 lb Date of Birth:  July 23, 1945         BSA:          1.725 m Patient Age:    77 years          BP:           135/63 mmHg Patient Gender: M                 HR:           84 bpm. Exam Location:  Inpatient Procedure: 2D Echo, Color Doppler and Cardiac Doppler Indications:    acute systolic chf  mmHg.   Laboratory Data:  High Sensitivity Troponin:   Recent Labs  Lab 10/30/22 1733 10/30/22 1942 10/30/22 2204 10/31/22 0007 10/31/22 0215  TROPONINIHS 38* 50* 63* 67* 62*     Chemistry Recent Labs  Lab 10/29/22 0924 10/29/22 0936 10/30/22 0550  10/31/22 0215  NA 135 140 139 139  K 3.6 3.7 3.7 3.2*  CL 102  --  103 100  CO2 21*  --  26 28  GLUCOSE 178*  --  144* 137*  BUN 12  --  16 19  CREATININE 1.05  --  1.10 1.18  CALCIUM 8.3*  --  8.7* 8.2*  MG  --   --  2.0 1.6*  GFRNONAA >60  --  >60 >60  ANIONGAP 12  --  10 11    Recent Labs  Lab 10/29/22 0924  PROT 6.3*  ALBUMIN 2.6*  AST 40  ALT 32  ALKPHOS 69  BILITOT 0.8   Lipids No results for input(s): "CHOL", "TRIG", "HDL", "LABVLDL", "LDLCALC", "CHOLHDL" in the last 168 hours.  Hematology Recent Labs  Lab 10/29/22 0924 10/29/22 0936 10/30/22 0550 10/31/22 0531  WBC 14.0*  --  13.0* 12.9*  RBC 4.08*  --  3.63* 3.81*  HGB 13.1 13.9 11.3* 11.7*  HCT 40.8 41.0 34.3* 35.8*  MCV 100.0  --  94.5 94.0  MCH 32.1  --  31.1 30.7  MCHC 32.1  --  32.9 32.7  RDW 18.3*  --  18.5* 18.2*  PLT 177  --  189 179   Thyroid No results for input(s): "TSH", "FREET4" in the last 168 hours.  BNP Recent Labs  Lab 10/29/22 0924  BNP 2,456.6*    DDimer No results for input(s): "DDIMER" in the last 168 hours.   Radiology/Studies:  DG CHEST PORT 1 VIEW  Result Date: 10/30/2022 CLINICAL DATA:  Dyspnea EXAM: PORTABLE CHEST 1 VIEW COMPARISON:  Chest x-ray 10/29/2022.  Chest CT 10/29/2022. FINDINGS: Right chest port catheter tip projects over the distal SVC. Left-sided pacemaker is present. Right mid lung infiltrate has decreased in density, but has mildly increased in size. There is new airspace disease in the left mid lung. The costophrenic angles are clear. There is no pneumothorax or acute fracture. IMPRESSION: 1. Right mid lung infiltrate has decreased in density, but has mildly increased in size. 2. New airspace disease in the left mid lung. Electronically Signed   By: Darliss Cheney M.D.   On: 10/30/2022 19:20   ECHOCARDIOGRAM COMPLETE  Result Date: 10/30/2022    ECHOCARDIOGRAM REPORT   Patient Name:   Benjamin Cook Date of Exam: 10/30/2022 Medical Rec #:  [id number]          Height:       70.0 in Accession #:    [id number]        Weight:       127.6 lb Date of Birth:  July 23, 1945         BSA:          1.725 m Patient Age:    77 years          BP:           135/63 mmHg Patient Gender: M                 HR:           84 bpm. Exam Location:  Inpatient Procedure: 2D Echo, Color Doppler and Cardiac Doppler Indications:    acute systolic chf  Cardiology Consultation   Patient ID: Benjamin Cook MRN: [id number]; DOB: 26-May-1945  Admit date: 10/29/2022 Date of Consult: 10/31/2022  PCP:  [name], MD   [facility] Providers Cardiologist:  Olga Millers, MD  Electrophysiologist:  [name], MD       Patient Profile:   Benjamin Cook is a 77 y.o. male with a hx of CAD, ICM/chronic HFrEF s/p STJ ICD, VT (s/p ablation 01/2021 with recurrence), PAF, AAA s/p EVAR 2019 previously managed by vascular surgery, lung CA, anxiety, arthritis, HTN, RBBB, severe COPD who is being seen 10/31/2022 for the evaluation of CHF at the request of Dr. Meredith Pel.  History of Present Illness:   [name] has complex cardiac history as above dating back to 2001 with abnormal EKG prompting cath with subsequent PCI to LAD with residual disease managed medically. Last cath 10/2020 showed 70% mCx, 75% distal LM (clarified in body of impression), 70% mRCA, 50% prox-mid RCA, patent LAD stent, manged medically. He also has longstanding hx of ICM/low EF and underwent ICD implantation remotely for primary prevention though has a history of VT. He had VT ablation in 2022 but then developed recurrence. This was treated with amiodarone, later paused in setting of elevated ESR, then resumed after having recurrent VT in 05/2022. He was also found to have lung mass that admission and is followed by oncology for diagnosis of stage 3A NSCLC. He sees Dr. Bertis Ruddy with oncology, was treated with carboplatin + Paclitaxel + XRT q7d then subsequent immunotherapy with durvulamab. Regarding vascular disease, aortic duplex in 06/2022 also showed distal portion of occluded aneurysmal sac measures 6.1 cm, cannot exclude endoleak - recommended for vascular surgery follow-up as he had last seen them in 2019 - this is scheduled for 01/2023 with appt notes indicating plan to defer f/u for 6 more months given cancer treatment.    He was admitted 10/29/22 with  increasing SOB over the past 1 month. He also had had subjective fevers at home. He was afebrile in ED, but did develop fever overnight to 101.5. In ED he was also tachycardic, tachypneic and hypoxic requiring NRB->BiPAP. He had reported that the patient was told to stop several of his medications in June 2024 due to hypotension so had not been taking his Entresto, furosemide, and spironolactone for the last 2-3 months. Phone note 07/30/22 recommended decrease Entresto to 24/26mg  BID and stop Imdur. Do not see notation to stop the above meds completely. 2D echo shows EF 25-30% (prev 20-25%), G1DD, elevated LVEDP.  Covid/RVP neg. BNP 2456. hsTroponins have been trended x 3 days at 31-33-38-50-63-67, remaining low. Initial CXR showed small right effusion, increased interstitial markings concerning for pulm edema, large right hilar mass. 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 F/u CXR showed increased R mid lung infiltrate and new airspace disease in the L lung. He has received multiple doses of IV Lasix this admission. Cardiology asked to consult regarding CHF. He unfortunately vomited several of his medicines this morning per notes. I/O's -2.3L, weights quite variable 134->128->127->132, prior OP dry weight 130lb. Blood cx NTD.  Past Medical History:  Diagnosis Date   AAA (abdominal aortic aneurysm) ([name])    Adrenal mass ([name])    per pt this is remote (10 years) and  Cardiology Consultation   Patient ID: Benjamin Cook MRN: [id number]; DOB: 26-May-1945  Admit date: 10/29/2022 Date of Consult: 10/31/2022  PCP:  [name], MD   [facility] Providers Cardiologist:  Olga Millers, MD  Electrophysiologist:  [name], MD       Patient Profile:   Benjamin Cook is a 77 y.o. male with a hx of CAD, ICM/chronic HFrEF s/p STJ ICD, VT (s/p ablation 01/2021 with recurrence), PAF, AAA s/p EVAR 2019 previously managed by vascular surgery, lung CA, anxiety, arthritis, HTN, RBBB, severe COPD who is being seen 10/31/2022 for the evaluation of CHF at the request of Dr. Meredith Pel.  History of Present Illness:   [name] has complex cardiac history as above dating back to 2001 with abnormal EKG prompting cath with subsequent PCI to LAD with residual disease managed medically. Last cath 10/2020 showed 70% mCx, 75% distal LM (clarified in body of impression), 70% mRCA, 50% prox-mid RCA, patent LAD stent, manged medically. He also has longstanding hx of ICM/low EF and underwent ICD implantation remotely for primary prevention though has a history of VT. He had VT ablation in 2022 but then developed recurrence. This was treated with amiodarone, later paused in setting of elevated ESR, then resumed after having recurrent VT in 05/2022. He was also found to have lung mass that admission and is followed by oncology for diagnosis of stage 3A NSCLC. He sees Dr. Bertis Ruddy with oncology, was treated with carboplatin + Paclitaxel + XRT q7d then subsequent immunotherapy with durvulamab. Regarding vascular disease, aortic duplex in 06/2022 also showed distal portion of occluded aneurysmal sac measures 6.1 cm, cannot exclude endoleak - recommended for vascular surgery follow-up as he had last seen them in 2019 - this is scheduled for 01/2023 with appt notes indicating plan to defer f/u for 6 more months given cancer treatment.    He was admitted 10/29/22 with  increasing SOB over the past 1 month. He also had had subjective fevers at home. He was afebrile in ED, but did develop fever overnight to 101.5. In ED he was also tachycardic, tachypneic and hypoxic requiring NRB->BiPAP. He had reported that the patient was told to stop several of his medications in June 2024 due to hypotension so had not been taking his Entresto, furosemide, and spironolactone for the last 2-3 months. Phone note 07/30/22 recommended decrease Entresto to 24/26mg  BID and stop Imdur. Do not see notation to stop the above meds completely. 2D echo shows EF 25-30% (prev 20-25%), G1DD, elevated LVEDP.  Covid/RVP neg. BNP 2456. hsTroponins have been trended x 3 days at 31-33-38-50-63-67, remaining low. Initial CXR showed small right effusion, increased interstitial markings concerning for pulm edema, large right hilar mass. 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 F/u CXR showed increased R mid lung infiltrate and new airspace disease in the L lung. He has received multiple doses of IV Lasix this admission. Cardiology asked to consult regarding CHF. He unfortunately vomited several of his medicines this morning per notes. I/O's -2.3L, weights quite variable 134->128->127->132, prior OP dry weight 130lb. Blood cx NTD.  Past Medical History:  Diagnosis Date   AAA (abdominal aortic aneurysm) ([name])    Adrenal mass ([name])    per pt this is remote (10 years) and

## 2022-10-31 NOTE — Assessment & Plan Note (Addendum)
Patient presented with acute shortness of breath and hypoxia to 80s, placed on BiPAP in ED initially. GDMT for HF discontinued in 07/2022 (including Entresto, home Lasix 20 mg, and Aldactone) 2/2 hypotension. BNP elevated to 2.4K. Was weaned to 4L [place], received Lasix 40 mg IV x 2 on 9/25. Overnight, patient became hypoxic on ambulation to 80s, placed back on BiPAP. Now on 15L [place], satting in 90s, with conversational dyspnea. Repeat CXR with evidence of PNA, repeat EKG with no evidence of acute ischemia, but preventricular excitation. Denies any chest pain this AM. Echo with LVEF of 25% similar to prior, with regional wall motion abnormalities. Etiology of persistent hypoxia is likely CHF exacerbation, however, mixed picture given concurrent PNA and underlying interstitial lung disease and malignancy. Patient also not does appear significantly fluid overloaded on exam. Output of 1.4L in last 24 hours. Will continue diuresis, but given comorbidities and advanced heart failure, will consult Cardiology.  - Consult Cardiology, appreciate recommendations - Wean oxygen as tolerated, BiPAP as needed for respiratory support - Lasix 60 mg IV - PM BMP and Mg - Continue IV Abx (Azithromycin, Rocephin) - PT/OT - Orthostatics prior to discharge

## 2022-10-31 NOTE — Plan of Care (Signed)
Problem: Education: Goal: Understanding of cardiac disease, CV risk reduction, and recovery process will improve Outcome: Progressing   Problem: Activity: Goal: Ability to tolerate increased activity will improve Outcome: Progressing   Problem: Cardiac: Goal: Ability to achieve and maintain adequate cardiovascular perfusion will improve Outcome: Progressing

## 2022-10-31 NOTE — Evaluation (Signed)
Occupational Therapy Evaluation Patient Details Name: Benjamin Cook MRN: [id number] DOB: 11-03-1945 Today's Date: 10/31/2022   History of Present Illness 77 yo male presents to [facility] on 9/24 from home with SOB, workup for acute hypoxic respiratory failure secondary to HF exacerbation. Respiratory distress event 9/25 requiring bipap. Recent admission for covid +. PMH includes lung cancer dx 05/2022, AICD, anxiety, OA, CAD, CHF with EF 20-25%, COPD, HTN, HLD, PVD.   Clinical Impression   At baseline, pt receives assistance with tub transfer but is otherwise is Independent to Mod I with ADLs and functional mobility. Pt now presents with decreased activity tolerance, generalized B UE strength, decreased balance during functional tasks, cardiopulmonary status affecting functional level, and decreased safety and independence with functional tasks. Pt currently completing UB ADLs Independent to Supervision and LB ADLs and functional transfers with Contact guard assist for safety. Pt's O2 sat 86%-97% on 13L continuous O2 through HFNC. OT educated pt in pursed lip breathing with pt demonstrating understanding through teach back. All other VSS throughout session. Pt will benefit from acute skilled OT services to address deficits outlined below and increase safety and independence with functional tasks. Post acute discharge, pt will benefit from continued skilled OT services in the home to maximize rehab potential.       If plan is discharge home, recommend the following: A little help with walking and/or transfers;A little help with bathing/dressing/bathroom;Assistance with cooking/housework;Assist for transportation;Help with stairs or ramp for entrance    Functional Status Assessment  Patient has had a recent decline in their functional status and demonstrates the ability to make significant improvements in function in a reasonable and predictable amount of time.  Equipment Recommendations  None  recommended by OT (Pt already has needed equipment)    Recommendations for Other Services       Precautions / Restrictions Precautions Precautions: Fall Precaution Comments: on 13LO2 this session Restrictions Weight Bearing Restrictions: No      Mobility Bed Mobility Overal bed mobility: Needs Assistance Bed Mobility: Supine to Sit, Sit to Supine     Supine to sit: Supervision Sit to supine: Supervision   General bed mobility comments: for safety, HOB elevation and use of bedrails    Transfers Overall transfer level: Needs assistance Equipment used: 1 person hand held assist Transfers: Sit to/from Stand Sit to Stand: Min assist           General transfer comment: light rise and steady assist, tolerating x3 lateral steps towards HOB      Balance Overall balance assessment: Needs assistance Sitting-balance support: No upper extremity supported, Feet supported Sitting balance-Leahy Scale: Fair     Standing balance support: Single extremity supported, No upper extremity supported, During functional activity Standing balance-Leahy Scale: Fair                             ADL either performed or assessed with clinical judgement   ADL Overall ADL's : Needs assistance/impaired Eating/Feeding: Independent;Sitting   Grooming: Set up;Sitting   Upper Body Bathing: Supervision/ safety;Sitting   Lower Body Bathing: Contact guard assist;Sit to/from stand   Upper Body Dressing : Set up;Sitting   Lower Body Dressing: Contact guard assist;Sit to/from stand   Toilet Transfer: Contact guard assist;Stand-pivot;BSC/3in1   Toileting- Clothing Manipulation and Hygiene: Contact guard assist;Sit to/from stand         General ADL Comments: Pt with decreased activity tolerance and with O2 sat dropping to 86% on  13L continuous O2 through HFNC. Pt requiring frequent rest breaks during functional tasks.     Vision Baseline Vision/History: 1 Wears glasses (for  distance) Ability to See in Adequate Light: 0 Adequate Patient Visual Report: No change from baseline       Perception         Praxis         Pertinent Vitals/Pain Pain Assessment Pain Assessment: No/denies pain     Extremity/Trunk Assessment Upper Extremity Assessment Upper Extremity Assessment: Right hand dominant;Generalized weakness   Lower Extremity Assessment Lower Extremity Assessment: Defer to PT evaluation   Cervical / Trunk Assessment Cervical / Trunk Assessment: Normal   Communication Communication Communication: No apparent difficulties   Cognition Arousal: Alert Behavior During Therapy: WFL for tasks assessed/performed Overall Cognitive Status: Within Functional Limits for tasks assessed                                 General Comments: AAOx4 and pleasant throughout session. Pt demonstrates ability to follow multi-step commands consistantly.     General Comments  O2 sat 86%-97% on 13L continuous O2 through HFNC. OT educated pt in pursed lip breathing with pt demonstrating understanding through teach back. All other VSS throughout session. Pt's son-in-law present throughout session.    Exercises     Shoulder Instructions      Home Living Family/patient expects to be discharged to:: Private residence Living Arrangements: Spouse/significant other Available Help at Discharge: Family;Available 24 hours/day (Pt's daughter and son-in-law live nearby.) Type of Home: House Home Access: Level entry     Home Layout: One level     Bathroom Shower/Tub: Tub/shower unit;Walk-in shower   Bathroom Toilet: Standard Bathroom Accessibility: Yes How Accessible: Accessible via walker Home Equipment: Rolling Walker (2 wheels);Cane - single point;Shower seat - built in;BSC/3in1;Grab bars - tub/shower;Hand held shower head   Additional Comments: Pt reports he typically uses tub/shower combo with assist for tub transfer. OT educated pt on benefits of  using walk-in shower upon acute discharge. Pt reports he is open to the option of using the walk-in shower with built-in seat when returning home to improve safety and for energy conservation.      Prior Functioning/Environment Prior Level of Function : Independent/Modified Independent             Mobility Comments: pt reports walking 50-75 ft, then needs to sit and rest due to weakness and DOE. states he wasn't using a cane "...yet, but I probably should be" ADLs Comments: wife assists with in and out of tub otherwise Independent to Mod I        OT Problem List: Decreased strength;Decreased activity tolerance;Impaired balance (sitting and/or standing);Cardiopulmonary status limiting activity      OT Treatment/Interventions: Self-care/ADL training;Therapeutic exercise;Energy conservation;DME and/or AE instruction;Therapeutic activities;Patient/family education;Balance training    OT Goals(Current goals can be found in the care plan section) Acute Rehab OT Goals Patient Stated Goal: to feel better and return home OT Goal Formulation: With patient Time For Goal Achievement: 11/14/22 Potential to Achieve Goals: Good ADL Goals Pt Will Perform Lower Body Bathing: with modified independence;sit to/from stand Pt Will Perform Lower Body Dressing: with modified independence;sit to/from stand Pt Will Transfer to Toilet: with modified independence;ambulating;regular height toilet (with least restrictive AD) Pt Will Perform Toileting - Clothing Manipulation and hygiene: with modified independence;sit to/from stand;sitting/lateral leans Pt/caregiver will Perform Home Exercise Program: Increased strength;Both right and left upper extremity;With theraband;With  theraputty;Independently;With written HEP provided (Increased activity tolerance) Additional ADL Goal #1: Pt will demonstrate ability to Independently state 4 energy conservation strategies for increased safety and independence with  functional tasks in the home.  OT Frequency: Min 1X/week    Co-evaluation              AM-PAC OT "6 Clicks" Daily Activity     Outcome Measure Help from another person eating meals?: None Help from another person taking care of personal grooming?: A Little Help from another person toileting, which includes using toliet, bedpan, or urinal?: A Little Help from another person bathing (including washing, rinsing, drying)?: A Little Help from another person to put on and taking off regular upper body clothing?: A Little Help from another person to put on and taking off regular lower body clothing?: A Little 6 Click Score: 19   End of Session Equipment Utilized During Treatment: Gait belt;Oxygen Nurse Communication: Mobility status;Other (comment) (O2 sat)  Activity Tolerance: Patient tolerated treatment well;Treatment limited secondary to medical complications (Comment) (Limited by decreased O2 sat with activity) Patient left: in bed;with call bell/phone within reach;with bed alarm set;with family/visitor present  OT Visit Diagnosis: Other abnormalities of gait and mobility (R26.89);Muscle weakness (generalized) (M62.81);Other (comment) (Decreased activity tolerance)                Time: 4098-1191 OT Time Calculation (min): 18 min Charges:  OT General Charges $OT Visit: 1 Visit OT Evaluation $OT Eval Low Complexity: 1 Low  Keanen Dohse "Orson Eva., OTR/L, MA Acute Rehab [phone]  Lendon Colonel 10/31/2022, 5:56 PM

## 2022-10-31 NOTE — Progress Notes (Signed)
RN went to grab water for patient when NS notified RN that patient vomited. RN provided daily AM meds 10 mins prior. Meds in emesis basin. RN notified MD team for IV K run and IV nausea prn. Patient expresses that "I feel better after I threw up".   MD notified RN that patient is unable to receive prn nausea med due to prolonged Qtc.   Patient is comfortable, wife at bedside.

## 2022-10-31 NOTE — Assessment & Plan Note (Addendum)
Patient with history of paroxysmal atrial fibrillation, has had ICD placement in 2016. Currently controlled on amiodarone 200 mg BID as well as Xarelto 20 mg daily. Sinus rhythm on presentation, tachycardic to 100, but now HR in 80s. Overnight, with nausea and became hypoxic when ambulating. Repeat EKG with sinus rhythm and stable rate, but ventricular pre-excitation. Trops uptrended to 60, but now flat and downtrending (63 > 67 > 62). No acute ischemic changes. Patient with no chest pain this morning.  - Continue home Amiodarone 200 mg BID - Continue Xarelto 15 mg daily, due to CrCl < 50 - EKG in AM - Cardiac monitoring - Cardiology consulted, appreciate their recommendations regarding medication management

## 2022-10-31 NOTE — Evaluation (Signed)
Physical Therapy Evaluation Patient Details Name: Benjamin Cook MRN: [id number] DOB: 1945/11/26 Today's Date: 10/31/2022  History of Present Illness  77 yo male presents to [facility] on 9/24 from home with SOB, workup for acute hypoxic respiratory failure secondary to HF exacerbation. Respiratory distress event 9/25 requiring bipap. Recent admission for covid +. PMH includes lung cancer dx 05/2022, AICD, anxiety, OA, CAD, CHF with EF 20-25%, COPD, HTN, HLD, PVD.  Clinical Impression   Pt presents with generalized weakness, dyspnea on exertion on 13-15LO2 today, impaired activity tolerance, impaired balance. Pt to benefit from acute PT to address deficits. Pt requiring light assist for transfer-level mobility, not progressed given frequent desats <88% over the past 24 hours and RN recommendation. PT anticipates good progress when respiratory status improves. PT to progress mobility as tolerated, and will continue to follow acutely.          If plan is discharge home, recommend the following: A little help with walking and/or transfers;A little help with bathing/dressing/bathroom   Can travel by private vehicle        Equipment Recommendations None recommended by PT  Recommendations for Other Services       Functional Status Assessment Patient has had a recent decline in their functional status and demonstrates the ability to make significant improvements in function in a reasonable and predictable amount of time.     Precautions / Restrictions Precautions Precautions: Fall Precaution Comments: on 15LO2 at start of session, dropped down to 13LO2 by RRT midsession Restrictions Weight Bearing Restrictions: No      Mobility  Bed Mobility Overal bed mobility: Needs Assistance Bed Mobility: Supine to Sit, Sit to Supine     Supine to sit: Supervision Sit to supine: Supervision   General bed mobility comments: for safety, HOB elevation and use of bedrails    Transfers Overall  transfer level: Needs assistance Equipment used: 1 person hand held assist Transfers: Sit to/from Stand Sit to Stand: Min assist           General transfer comment: light rise and steady assist, tolerating x3 lateral steps towards HOB. SPO2 88-98 throughout session with O2 sensor moved to ear    Ambulation/Gait                  Stairs            Wheelchair Mobility     Tilt Bed    Modified Rankin (Stroke Patients Only)       Balance Overall balance assessment: Needs assistance Sitting-balance support: No upper extremity supported, Feet supported Sitting balance-Leahy Scale: Fair     Standing balance support: During functional activity, No upper extremity supported Standing balance-Leahy Scale: Fair                               Pertinent Vitals/Pain Pain Assessment Pain Assessment: No/denies pain    Home Living Family/patient expects to be discharged to:: Private residence Living Arrangements: Spouse/significant other Available Help at Discharge: Family;Available 24 hours/day Type of Home: House Home Access: Level entry       Home Layout: One level Home Equipment: Agricultural consultant (2 wheels);Cane - single point      Prior Function Prior Level of Function : Independent/Modified Independent             Mobility Comments: pt reports walking 50-75 ft, then needs to sit and rest due to weakness and DOE. states he wasn't using  a cane "...yet, but I probably should be" ADLs Comments: wife assists with in and out of tub     Extremity/Trunk Assessment   Upper Extremity Assessment Upper Extremity Assessment: Defer to OT evaluation    Lower Extremity Assessment Lower Extremity Assessment: Generalized weakness    Cervical / Trunk Assessment Cervical / Trunk Assessment: Normal  Communication   Communication Communication: No apparent difficulties  Cognition Arousal: Alert Behavior During Therapy: WFL for tasks  assessed/performed Overall Cognitive Status: Within Functional Limits for tasks assessed                                          General Comments General comments (skin integrity, edema, etc.): SpO2 88-98% on 13LO2, SpO2 sensor moved to ear for improved reading    Exercises     Assessment/Plan    PT Assessment Patient needs continued PT services  PT Problem List Decreased strength;Decreased mobility;Decreased activity tolerance;Decreased balance;Decreased knowledge of use of DME;Cardiopulmonary status limiting activity       PT Treatment Interventions DME instruction;Therapeutic activities;Therapeutic exercise;Gait training;Patient/family education;Balance training;Stair training;Functional mobility training;Neuromuscular re-education    PT Goals (Current goals can be found in the Care Plan section)  Acute Rehab PT Goals Patient Stated Goal: home PT Goal Formulation: With patient/family Time For Goal Achievement: 11/14/22 Potential to Achieve Goals: Good    Frequency Min 1X/week     Co-evaluation               AM-PAC PT "6 Clicks" Mobility  Outcome Measure Help needed turning from your back to your side while in a flat bed without using bedrails?: A Little Help needed moving from lying on your back to sitting on the side of a flat bed without using bedrails?: A Little Help needed moving to and from a bed to a chair (including a wheelchair)?: A Little Help needed standing up from a chair using your arms (e.g., wheelchair or bedside chair)?: A Little Help needed to walk in hospital room?: A Lot Help needed climbing 3-5 steps with a railing? : Total 6 Click Score: 15    End of Session Equipment Utilized During Treatment: Oxygen Activity Tolerance: Patient tolerated treatment well Patient left: in bed;with call bell/phone within reach;with bed alarm set;with family/visitor present;with nursing/sitter in room Nurse Communication: Mobility status PT  Visit Diagnosis: Other abnormalities of gait and mobility (R26.89);Muscle weakness (generalized) (M62.81)    Time: 1914-7829 PT Time Calculation (min) (ACUTE ONLY): 17 min   Charges:   PT Evaluation $PT Eval Low Complexity: 1 Low   PT General Charges $$ ACUTE PT VISIT: 1 Visit         Marye Round, PT DPT Acute Rehabilitation Services Secure Chat Preferred  Office [phone]   Nasirah Sachs E Christain [place] 10/31/2022, 1:31 PM

## 2022-11-01 ENCOUNTER — Other Ambulatory Visit: Payer: Self-pay | Admitting: Cardiology

## 2022-11-01 ENCOUNTER — Inpatient Hospital Stay (HOSPITAL_COMMUNITY): Payer: Medicare HMO

## 2022-11-01 ENCOUNTER — Telehealth (HOSPITAL_COMMUNITY): Payer: Self-pay

## 2022-11-01 DIAGNOSIS — I5021 Acute systolic (congestive) heart failure: Secondary | ICD-10-CM | POA: Diagnosis not present

## 2022-11-01 DIAGNOSIS — I5023 Acute on chronic systolic (congestive) heart failure: Secondary | ICD-10-CM | POA: Diagnosis not present

## 2022-11-01 DIAGNOSIS — R339 Retention of urine, unspecified: Secondary | ICD-10-CM | POA: Insufficient documentation

## 2022-11-01 DIAGNOSIS — J9601 Acute respiratory failure with hypoxia: Secondary | ICD-10-CM | POA: Diagnosis not present

## 2022-11-01 LAB — BASIC METABOLIC PANEL
Anion gap: 6 (ref 5–15)
BUN: 14 mg/dL (ref 8–23)
CO2: 27 mmol/L (ref 22–32)
Calcium: 8.2 mg/dL — ABNORMAL LOW (ref 8.9–10.3)
Chloride: 104 mmol/L (ref 98–111)
Creatinine, Ser: 1.05 mg/dL (ref 0.61–1.24)
GFR, Estimated: >60 mL/min (ref >60)
Glucose, Bld: 123 mg/dL — ABNORMAL HIGH (ref 70–99)
Potassium: 3.7 mmol/L (ref 3.5–5.1)
Sodium: 137 mmol/L (ref 135–145)

## 2022-11-01 LAB — MAGNESIUM: Magnesium: 2.2 mg/dL (ref 1.7–2.4)

## 2022-11-01 LAB — ECHOCARDIOGRAM LIMITED
Height: 70 in
S' Lateral: 3.9 cm
Weight: 2127 [oz_av]

## 2022-11-01 MED ORDER — PERFLUTREN LIPID MICROSPHERE
1.0000 mL | INTRAVENOUS | Status: AC | PRN
  Administered 2022-11-01: 8 mL via INTRAVENOUS

## 2022-11-01 MED ORDER — POTASSIUM CHLORIDE CRYS ER 20 MEQ PO TBCR: 30.0000 meq | EXTENDED_RELEASE_TABLET | Freq: Once | ORAL | Status: DC

## 2022-11-01 MED ORDER — FUROSEMIDE 20 MG PO TABS
20.0000 mg | ORAL_TABLET | Freq: Every day | ORAL | Status: DC
  Administered 2022-11-01 – 2022-11-06 (×6): 20 mg via ORAL
  Filled 2022-11-01 (×6): qty 1

## 2022-11-01 MED ORDER — ENTRESTO 24-26 MG PO TABS: 1.0000 | ORAL_TABLET | Freq: Two times a day (BID) | ORAL | 3 refills | Status: DC

## 2022-11-01 MED ORDER — TAMSULOSIN HCL 0.4 MG PO CAPS
0.4000 mg | ORAL_CAPSULE | Freq: Every day | ORAL | Status: DC
  Administered 2022-11-01 – 2022-11-06 (×6): 0.4 mg via ORAL
  Filled 2022-11-01 (×6): qty 1

## 2022-11-01 NOTE — Assessment & Plan Note (Addendum)
Patient with chronic congestive heart failure (EF of 20-25% as of 05/2022). BNP elevated markedly and CXR with pulmonary edema on presentation, but appears dry on exam with cold extremities. Weaned to 4L [place] on 9/25, but hypoxic to 80s with ambulation, placed back on BiPAP overnight 9/25. Now on 3L [place], satting in 90s. Still having conversational dyspnea but no increased work of breathing at rest. Lungs with no wheezing.  Cr. 1.05, K of 3.7, Mg 2.2. Still with no leg swelling, abdomen is soft, distension improved from yesterday. Plan to have void trial 9/28 - Cardiology consulted, recommendations below:  - STOP IV diuresis  - START PO Lasix at home dose 20 mg daily   - RESTART home Entresto 24-26 mg BID - CONTINUE Metoprolol Suc 25 mg daily, Aldactone 25 mg daily - Strict I/Os - 20 meQ oral K - Goal K > 4, Mg > 2 - BMP and Mg in AM

## 2022-11-01 NOTE — Progress Notes (Signed)
Cardiology Progress Note  Patient ID: Benjamin Cook MRN: [id number] DOB: 15-Apr-1945 Date of Encounter: 11/01/2022  Primary Cardiologist: Olga Millers, MD  Subjective   Chief Complaint: SOB  HPI: Still requiring 7 L of oxygen.  Appears euvolemic.  Recommend repeat chest x-ray.  ROS:  All other ROS reviewed and negative. Pertinent positives noted in the HPI.     Inpatient Medications  Scheduled Meds:  amiodarone  200 mg Oral BID   atorvastatin  80 mg Oral Daily   Chlorhexidine Gluconate Cloth  6 each Topical Daily   feeding supplement  237 mL Oral BID BM   melatonin  5 mg Oral QHS   metoprolol succinate  25 mg Oral BID   mometasone-formoterol  2 puff Inhalation BID   pantoprazole  40 mg Oral Daily   rivaroxaban  15 mg Oral Q supper   sacubitril-valsartan  1 tablet Oral BID   senna-docusate  2 tablet Oral Daily   spironolactone  25 mg Oral Daily   Continuous Infusions:  cefTRIAXone (ROCEPHIN)  IV 2 g (10/31/22 2131)   PRN Meds: acetaminophen **OR** acetaminophen, ipratropium-albuterol   Vital Signs   Vitals:   11/01/22 0100 11/01/22 0502 11/01/22 0737 11/01/22 0749  BP: (!) 116/57 (!) 118/59 121/69   Pulse: 76 79 79 73  Resp: 20 20 16  (!) 21  Temp: 98.6 F (37 C) 97.8 F (36.6 C)    TempSrc: Oral Oral    SpO2: 98% 98% 95% 98%  Weight:  60.3 kg    Height:        Intake/Output Summary (Last 24 hours) at 11/01/2022 1038 Last data filed at 11/01/2022 0505 Gross per 24 hour  Intake 686 ml  Output 1300 ml  Net -614 ml      11/01/2022    5:02 AM 10/31/2022    6:09 AM 10/30/2022    8:23 AM  Last 3 Weights  Weight (lbs) 132 lb 15 oz 132 lb 4.4 oz 127 lb 10.3 oz  Weight (kg) 60.3 kg 60 kg 57.9 kg      Telemetry  Overnight telemetry shows SR 80s, which I personally reviewed.    Physical Exam   Vitals:   11/01/22 0100 11/01/22 0502 11/01/22 0737 11/01/22 0749  BP: (!) 116/57 (!) 118/59 121/69   Pulse: 76 79 79 73  Resp: 20 20 16  (!) 21  Temp: 98.6 F  (37 C) 97.8 F (36.6 C)    TempSrc: Oral Oral    SpO2: 98% 98% 95% 98%  Weight:  60.3 kg    Height:        Intake/Output Summary (Last 24 hours) at 11/01/2022 1038 Last data filed at 11/01/2022 0505 Gross per 24 hour  Intake 686 ml  Output 1300 ml  Net -614 ml       11/01/2022    5:02 AM 10/31/2022    6:09 AM 10/30/2022    8:23 AM  Last 3 Weights  Weight (lbs) 132 lb 15 oz 132 lb 4.4 oz 127 lb 10.3 oz  Weight (kg) 60.3 kg 60 kg 57.9 kg    Body mass index is 19.07 kg/m.  General: Well nourished, well developed, in no acute distress Head: Atraumatic, normal size  Eyes: PEERLA, EOMI  Neck: Supple, no JVD Endocrine: No thryomegaly Cardiac: Normal S1, S2; RRR; no murmurs, rubs, or gallops Lungs: Diminished breath sounds bilaterally Abd: Soft, nontender, no hepatomegaly  Ext: No edema, pulses 2+ Musculoskeletal: No deformities, BUE and BLE strength normal and equal  Skin: Warm and dry, no rashes   Neuro: Alert and oriented to person, place, time, and situation, CNII-XII grossly intact, no focal deficits  Psych: Normal mood and affect   Labs  High Sensitivity Troponin:   Recent Labs  Lab 10/30/22 1733 10/30/22 1942 10/30/22 2204 10/31/22 0007 10/31/22 0215  TROPONINIHS 38* 50* 63* 67* 62*     Cardiac EnzymesNo results for input(s): "TROPONINI" in the last 168 hours. No results for input(s): "TROPIPOC" in the last 168 hours.  Chemistry Recent Labs  Lab 10/29/22 0924 10/29/22 0936 10/31/22 0215 10/31/22 1600 11/01/22 0545  NA 135   < > 139 138 137  K 3.6   < > 3.2* 3.3* 3.7  CL 102   < > 100 99 104  CO2 21*   < > 28 27 27   GLUCOSE 178*   < > 137* 127* 123*  BUN 12   < > 19 16 14   CREATININE 1.05   < > 1.18 1.12 1.05  CALCIUM 8.3*   < > 8.2* 8.2* 8.2*  PROT 6.3*  --   --   --   --   ALBUMIN 2.6*  --   --   --   --   AST 40  --   --   --   --   ALT 32  --   --   --   --   ALKPHOS 69  --   --   --   --   BILITOT 0.8  --   --   --   --   GFRNONAA >60   < > >60  >60 >60  ANIONGAP 12   < > 11 12 6    < > = values in this interval not displayed.    Hematology Recent Labs  Lab 10/29/22 0924 10/29/22 0936 10/30/22 0550 10/31/22 0531  WBC 14.0*  --  13.0* 12.9*  RBC 4.08*  --  3.63* 3.81*  HGB 13.1 13.9 11.3* 11.7*  HCT 40.8 41.0 34.3* 35.8*  MCV 100.0  --  94.5 94.0  MCH 32.1  --  31.1 30.7  MCHC 32.1  --  32.9 32.7  RDW 18.3*  --  18.5* 18.2*  PLT 177  --  189 179   BNP Recent Labs  Lab 10/29/22 0924  BNP 2,456.6*    DDimer No results for input(s): "DDIMER" in the last 168 hours.   Radiology  DG CHEST PORT 1 VIEW  Result Date: 10/30/2022 CLINICAL DATA:  Dyspnea EXAM: PORTABLE CHEST 1 VIEW COMPARISON:  Chest x-ray 10/29/2022.  Chest CT 10/29/2022. FINDINGS: Right chest port catheter tip projects over the distal SVC. Left-sided pacemaker is present. Right mid lung infiltrate has decreased in density, but has mildly increased in size. There is new airspace disease in the left mid lung. The costophrenic angles are clear. There is no pneumothorax or acute fracture. IMPRESSION: 1. Right mid lung infiltrate has decreased in density, but has mildly increased in size. 2. New airspace disease in the left mid lung. Electronically Signed   By: Darliss Cheney M.D.   On: 10/30/2022 19:20   ECHOCARDIOGRAM COMPLETE  Result Date: 10/30/2022    ECHOCARDIOGRAM REPORT   Patient Name:   Benjamin Cook Date of Exam: 10/30/2022 Medical Rec #:  [id number]         Height:       70.0 in Accession #:    [id number]        Weight:  127.6 lb Date of Birth:  29-Sep-1945         BSA:          1.725 m Patient Age:    77 years          BP:           135/63 mmHg Patient Gender: M                 HR:           84 bpm. Exam Location:  Inpatient Procedure: 2D Echo, Color Doppler and Cardiac Doppler Indications:    acute systolic chf  History:        Patient has prior history of Echocardiogram examinations, most                 recent 06/03/2022. Cardiomyopathy, Defibrillator,  COPD and lung                 cancer, Arrythmias:Atrial Fibrillation; Risk                 Factors:Hypertension and Dyslipidemia.  Sonographer:    Delcie Roch RDCS Referring Phys: [id number] Jerilee Field SCHEVING IMPRESSIONS  1. Left ventricular ejection fraction, by estimation, is 25 to 30%. The left ventricle has severely decreased function. The left ventricle demonstrates regional wall motion abnormalities (see scoring diagram/findings for description). The left ventricular internal cavity size was moderately dilated. Left ventricular diastolic parameters are consistent with Grade I diastolic dysfunction (impaired relaxation). Elevated left ventricular end-diastolic pressure.  2. Right ventricular systolic function is normal. The right ventricular size is normal. Tricuspid regurgitation signal is inadequate for assessing PA pressure.  3. The mitral valve is normal in structure. Trivial mitral valve regurgitation. No evidence of mitral stenosis.  4. The aortic valve is tricuspid. Aortic valve regurgitation is not visualized. Aortic valve sclerosis/calcification is present, without any evidence of aortic stenosis.  5. The inferior vena cava is normal in size with greater than 50% respiratory variability, suggesting right atrial pressure of 3 mmHg. Compared to prior study, LVF appears slightly improved. Cannot adequately assess the LV apex. Recommend repeat limited echo with definity contrast. FINDINGS  Left Ventricle: Left ventricular ejection fraction, by estimation, is 25 to 30%. The left ventricle has severely decreased function. The left ventricle demonstrates regional wall motion abnormalities. The left ventricular internal cavity size was moderately dilated. There is no left ventricular hypertrophy. Left ventricular diastolic parameters are consistent with Grade I diastolic dysfunction (impaired relaxation). Elevated left ventricular end-diastolic pressure. Right Ventricle: The right ventricular size is  normal. No increase in right ventricular wall thickness. Right ventricular systolic function is normal. Tricuspid regurgitation signal is inadequate for assessing PA pressure. Left Atrium: Left atrial size was normal in size. Right Atrium: Right atrial size was normal in size. Pericardium: There is no evidence of pericardial effusion. Mitral Valve: The mitral valve is normal in structure. Trivial mitral valve regurgitation. No evidence of mitral valve stenosis. Tricuspid Valve: The tricuspid valve is normal in structure. Tricuspid valve regurgitation is not demonstrated. No evidence of tricuspid stenosis. Aortic Valve: The aortic valve is tricuspid. Aortic valve regurgitation is not visualized. Aortic valve sclerosis/calcification is present, without any evidence of aortic stenosis. Pulmonic Valve: The pulmonic valve was normal in structure. Pulmonic valve regurgitation is not visualized. No evidence of pulmonic stenosis. Aorta: The aortic root is normal in size and structure. Venous: The inferior vena cava is normal in size with greater than 50% respiratory variability, suggesting right atrial  pressure of 3 mmHg. IAS/Shunts: No atrial level shunt detected by color flow Doppler. Additional Comments: A device lead is visualized.  LEFT VENTRICLE PLAX 2D LVIDd:         6.00 cm      Diastology LVIDs:         5.00 cm      LV e' medial:    3.70 cm/s LV PW:         1.00 cm      LV E/e' medial:  15.7 LV IVS:        0.90 cm      LV e' lateral:   3.26 cm/s LVOT diam:     2.00 cm      LV E/e' lateral: 17.9 LV SV:         47 LV SV Index:   28 LVOT Area:     3.14 cm  LV Volumes (MOD) LV vol d, MOD A4C: 153.0 ml LV vol s, MOD A4C: 111.0 ml LV SV MOD A4C:     153.0 ml RIGHT VENTRICLE RV Basal diam:  2.70 cm RV S prime:     7.07 cm/s TAPSE (M-mode): 1.1 cm LEFT ATRIUM             Index        RIGHT ATRIUM           Index LA diam:        3.40 cm 1.97 cm/m   RA Area:     13.30 cm LA Vol (A2C):   61.0 ml 35.37 ml/m  RA Volume:    34.10 ml  19.77 ml/m LA Vol (A4C):   52.4 ml 30.38 ml/m LA Biplane Vol: 58.7 ml 34.04 ml/m  AORTIC VALVE LVOT Vmax:   97.40 cm/s LVOT Vmean:  59.500 cm/s LVOT VTI:    0.151 m  AORTA Ao Root diam: 2.90 cm Ao Asc diam:  3.00 cm MV E velocity: 58.20 cm/s MV A velocity: 110.00 cm/s  SHUNTS MV E/A ratio:  0.53         Systemic VTI:  0.15 m                             Systemic Diam: 2.00 cm Armanda Magic MD Electronically signed by Armanda Magic MD Signature Date/Time: 10/30/2022/4:42:10 PM    Final     Cardiac Studies  TTE 10/30/2022  1. Left ventricular ejection fraction, by estimation, is 25 to 30%. The  left ventricle has severely decreased function. The left ventricle  demonstrates regional wall motion abnormalities (see scoring  diagram/findings for description). The left  ventricular internal cavity size was moderately dilated. Left ventricular  diastolic parameters are consistent with Grade I diastolic dysfunction  (impaired relaxation). Elevated left ventricular end-diastolic pressure.   2. Right ventricular systolic function is normal. The right ventricular  size is normal. Tricuspid regurgitation signal is inadequate for assessing  PA pressure.   3. The mitral valve is normal in structure. Trivial mitral valve  regurgitation. No evidence of mitral stenosis.   4. The aortic valve is tricuspid. Aortic valve regurgitation is not  visualized. Aortic valve sclerosis/calcification is present, without any  evidence of aortic stenosis.   5. The inferior vena cava is normal in size with greater than 50%  respiratory variability, suggesting right atrial pressure of 3 mmHg.   Patient Profile  77 year old male with history of systolic heart failure (EF 25-30%; ischemic etiology), severe  three-vessel CAD with left main disease not amendable to intervention, paroxysmal atrial fibrillation, ventricular tachycardia status post ICD, paroxysmal atrial fibrillation, AAA status post repair, lung cancer,  severe COPD who was admitted on 10/29/2022 for acute hypoxic respiratory failure secondary to acute decompensated systolic heart failure and pneumonia.   Assessment & Plan   # Acute on chronic systolic heart failure, EF 25 to 30% # Ischemic cardiomyopathy -Euvolemic on exam.  Still short of breath.  Would like to repeat a chest x-ray to see if he has had resolution of his right pleural effusion.  If not may need to consider thoracentesis.  Suspect this is all pneumonia.  Still requiring lots of oxygen. -No further IV diuresis. -Transition to Lasix 20 mg daily. -Continue metoprolol succinate 25 mg daily, Entresto 24 to 26 mg twice daily, Aldactone 25 mg daily.  Consider SGLT2 inhibitor. -Currently DNR with limited scope of care.  I do agree with this.  The patient has severe multivessel CAD and a severely reduced LVEF.  Now here with lung cancer and pneumonia.  Really unclear long-term prognosis. -He does have a limited echo to evaluate for LV thrombus.  # Severe CAD, able to PCI or CABG -Known left main disease that has not been revascularized. -No symptoms of chest pain. -Continue beta-blocker and statin.  Not on aspirin as he is on a DOAC.  # Lung cancer # Severe COPD # Pneumonia -Per hospital team.  # VT status post ICD -Continue amiodarone.  # Goals of care -DNR.  Agree.  Will continue with palliative care discussions.  [facility] will sign off.   Medication Recommendations: Medications as above Other recommendations (labs, testing, etc): None Follow up as an outpatient: 2 to 4 weeks with Dr. [name]  For questions or updates, please contact [facility] Please consult [email or web address] for contact info under        Signed, Gerri Spore T. Flora Lipps, MD, [facility]  [facility]  11/01/2022 10:38 AM

## 2022-11-01 NOTE — Progress Notes (Signed)
   11/01/22 0012  BiPAP/CPAP/SIPAP  Reason BIPAP/CPAP not in use Non-compliant (refused)  BiPAP/CPAP /SiPAP Vitals  Pulse Rate 79  Resp (!) 23  SpO2 99 %  MEWS Score/Color  MEWS Score 1  MEWS Score Color Green

## 2022-11-01 NOTE — TOC Progression Note (Signed)
Transition of Care [facility]) - Progression Note    Patient Details  Name: Benjamin Cook MRN: [id number] Date of Birth: [birth year]  Transition of Care [facility]) CM/SW Contact  Leone Haven, RN Phone Number: 11/01/2022, 8:51 AM  Clinical Narrative:    NCM spoke with wife on the phone who was at the bedside, offered choice for HHPT, HHOT, she states they do not have a preference.  NCM made referral to [facility] with [facility], she is able to take referral.  Soc will begin 24 to 48 hrs post dc.    Expected Discharge Plan: Home w Home Health Services Barriers to Discharge: Continued Medical Work up  Expected Discharge Plan and Services In-house Referral: NA Discharge Planning Services: CM Consult Post Acute Care Choice: Home Health Living arrangements for the past 2 months: Single Family Home                 DME Arranged: N/A DME Agency: NA       HH Arranged: PT, OT HH Agency: [facility] Date HH Agency Contacted: 11/01/22 Time HH Agency Contacted: [phone] Representative spoke with at [facility] Agency: Haywood Lasso   Social Determinants of Health (SDOH) Interventions SDOH Screenings   Food Insecurity: No Food Insecurity (10/29/2022)  Housing: Low Risk  (10/30/2022)  Recent Concern: Housing - Medium Risk (10/29/2022)  Transportation Needs: No Transportation Needs (10/30/2022)  Utilities: Not At Risk (10/29/2022)  Alcohol Screen: Low Risk  (10/30/2022)  Financial Resource Strain: Low Risk  (10/30/2022)  Social Connections: Unknown (06/19/2021)   Received from [facility]  Tobacco Use: High Risk (10/29/2022)    Readmission Risk Interventions     No data to display

## 2022-11-01 NOTE — Assessment & Plan Note (Signed)
Patient with history of paroxysmal atrial fibrillation, has had ICD placement in 2016. Currently controlled on amiodarone 200 mg BID as well as Xarelto 20 mg daily. Sinus rhythm on presentation, tachycardic to 100, but now HR in 80s. 9/25 overnight became hypoxic and nauseous. Repeat EKG with no ischemia. Uptrending trops to 67, but downtrended on 9/26 (63 > 67 > 62). No chest pain, palpitations.  - Cardiology consulted, recommendations below:  - Continue Amiodarone 200 mg BID  - Continue Xarelto 15 mg daily - Cardiac monitoring

## 2022-11-01 NOTE — Assessment & Plan Note (Signed)
Patient with subjective fevers and cough at home, afebrile on presentation. Initial CXR on admission with no evidence of PNA. ProCal 0.81, RPP  negative, COVID negative, MRSA nares negative. Abx initiated on presentation, but discontinued on 9/25 due to clinical improvement. Febrile overnight 9/25 to 101F and patient with hypoxia on ambulation, repeat CXR with evidence of PNA. IV Abx continued 9/26. Continue IV Abx at this time. Less concern for aspiration PNA given no productive cough/purulent sputum. Blood cultures with no growth at 48 hours.  - Continue IV Azithromycin (9/25-9/27), and Rocephin (9/24-9/28) - Follow up blood cultures

## 2022-11-01 NOTE — Assessment & Plan Note (Signed)
Patient with long-standing COPD, home regimen includes Dulera and rescue albuterol inhaler. No increase in cough or sputum production/quality. No wheezing noted on lung exam.  - DuoNebs PRN - Continue home [facility]

## 2022-11-01 NOTE — Progress Notes (Signed)
Went to bedside with medical student Susy Manor to discuss goals of care with patient. I discussed clinical course with him including restricted heart failure, pneumonia, known malignancy.  I discussed that given serious comorbidities that I do not believe that a meaningful recovery would be achieved with resuscitation in the case of if his heart were to stop.  He also does not want to be intubated in the case of respiratory failure.  Patient and wife agreed with this and would like to switch to DNR/DNI.  I discussed that we continue full medical care otherwise. I was able to wean his oxygen to 3L HFNC from 13 this AM and he was saturating at 94-95% without dyspnea. Discussed with nursing team to have incentive spirometer for patient and discussed with patient to use every hour while awake.

## 2022-11-01 NOTE — Telephone Encounter (Signed)
Confirmed he is still approved to receive Entresto through [employer] patient assistance foundation. The representative stated they need a new prescription to be sent to them.   Cardiology contacted and approved new prescription to be sent to [employer].  Will request that the patient also contacts [employer] to request the shipment of medication so it will hopefully be available once he is discharged from the hospital.   Informed primary team as well.

## 2022-11-01 NOTE — Progress Notes (Signed)
Echocardiogram 2D Echocardiogram has been performed.  [name] 11/01/2022, 5:41 PM

## 2022-11-01 NOTE — Progress Notes (Signed)
Asked to arrange gen cards f/u 2-4 weeks. Have arranged Monday Nov 25, 2022 and put on AVS. Also has HF TOC f/u 10/9, no changes made to this visit.

## 2022-11-01 NOTE — Care Management Important Message (Signed)
Important Message  Patient Details  Name: Benjamin Cook MRN: [id number] Date of Birth: November 07, 1945   Important Message Given:  Yes - Medicare IM     Dorena Bodo 11/01/2022, 3:14 PM

## 2022-11-01 NOTE — Progress Notes (Addendum)
   Heart Failure Stewardship Pharmacist Progress Note   PCP: [name], MD PCP-Cardiologist: Olga Millers, MD    HPI:  77 yo M with PMH of CHF, afib, COPD, CAD, HTN, and stage 3 NSCLC.  Cardiac history dates back to June of 2001. At that time the patient was found to have an abnormal EKG. Catheterization revealed a 95% mid LAD and a 70% diffuse right coronary artery and underwent PCI of LAD. His EF was 25-30%.   Presented to the ED on 9/24 with shortness of breath for 2 months. CXR with concern for pulmonary edema. CTA with cardiomegaly, concern for PNA, moderate R and small L pleural effusions, and negative for PE. BNP 2456.6. ECHO 9/25 showed LVEF 25-30% (last EF 20-25%), RWMA, G1DD, RV normal.    Current HF Medications: Beta Blocker: metoprolol XL 25 mg BID ACE/ARB/ARNI: Entresto 24/26 mg BID MRA: spironolactone 25 mg daily  Prior to admission HF Medications: Beta blocker: metoprolol XL 50 mg BID MRA: spironolactone 25 mg daily *not taking lasix or Entresto due to hypotension  Pertinent Lab Values: Serum creatinine 1.02, BUN 14, Potassium 3.7, Sodium 137, BNP 2456.6, Magnesium 2.2  Vital Signs: Weight: 132 lbs (admission weight: 134 lbs) Blood pressure: 120/70s  Heart rate: 70-80s  I/O: net -2.7L since admission  Medication Assistance / Insurance Benefits Check: Does the patient have prescription insurance?  Yes Type of insurance plan: Monia Pouch Medicare  Does the patient qualify for medication assistance through manufacturers or grants? Yes Eligible grants and/or patient assistance programs: Entresto Medication assistance applications in progress: Entresto  Medication assistance applications approved: none Approved medication assistance renewals will be completed by: [facility]  Outpatient Pharmacy:  Prior to admission [facility] Is the patient willing to use [facility] TOC pharmacy at discharge? Yes Is the patient willing to transition their outpatient  pharmacy to utilize a [facility] outpatient pharmacy?   No    Assessment: 1. Acute on chronic systolic CHF (LVEF 20-25%), due to ICM. NYHA class II symptoms. - Off IV lasix. Volume improved. Strict I/Os and daily weights. Keep K>4 and Mg>2. - Continue metoprolol XL 25 mg BID - Stopped Entresto 2/2 hypotension back in June with DBP 40s. Restarted 24/26 mg BID on 9/26. Monitor BP.  - Continue spironolactone 25 mg daily - Consider restarting Jardiance 10 mg daily. His wife said he was only on this for a short time before it was discontinued. It appears it was discontinued from med list 02/08/22. She does not recall any issues that he had that led to this being discontinued.    Plan: 1) Medication changes recommended at this time: - Start Jardiance 10 mg daily  2) Patient assistance: Sherryll Burger copay [phone] - previously received Entresto patient assistance as the copay for this + Xarelto too high. Will have patient advocate contact [employer] and confirm he is still approved and request delivery of the medication to his home.  - Farxiga/Jardiance copay $47  3)  Education  - Patient has been educated on current HF medications and potential additions to HF medication regimen - Patient verbalizes understanding that over the next few months, these medication doses may change and more medications may be added to optimize HF regimen - Patient has been educated on basic disease state pathophysiology and goals of therapy   Sharen Hones, PharmD, BCPS Heart Failure Stewardship Pharmacist Phone [phone]

## 2022-11-01 NOTE — Assessment & Plan Note (Signed)
Patient noted to have urinary retention with foley catheter placed 9/25, 650 cc output. Patient noted blood in foley catheter 9/27 AM. Small amount, with no clots. Denies any pain or burning with urination. Suspect this is due to trauma from foley catheter placement. - Continue to monitor for worsening clinical signs/symptoms.  - START 0.4 mg Flomax for urinary retention - REMOVE Foley catheter after monitoring output after administration of Flomax  - Bladder Scans q4h

## 2022-11-01 NOTE — Assessment & Plan Note (Signed)
Patient with diagnosis of Stage III NSCLC diagnosed on 06/03/2022 (followed by Dr. Bertis Ruddy), with 4.1 cm R hilar lesion with bilateral bronchial thickening. No evidence of metastatic disease per PET in 06/2022. Currently on chemotherapy with Durvalumab q28d, last dose on 10/01/2022. No recent weight loss, hemoptysis. No HA, abdominal pain, vomiting, diarrhea. Consulted Oncology, less concern for pneumonitis related to immunotherapy, they recommend outpatient immunotherapy, no further workup/recommendations for inpatient stay.  - Tylenol PRN for pain

## 2022-11-01 NOTE — Progress Notes (Addendum)
Daily Progress Note Intern Pager: [phone]  Patient name: Benjamin Cook Medical record number: [id number] Date of birth: [birth year] Age: 77 y.o. Gender: male  Primary Care Provider: [name], MD Consultants: Oncology, Cardiology Code Status: Full Code  Pt Overview and Major Events to Date:  9/24: Admitted to FMTS 9/25: Weaned to 4L [place] 9/25 PM: Put on BiPAP overnight 9/26: Weaned to high flow 15 L  Assessment and Plan:  Benjamin Cook is a 77 y.o. male with a past medical history of Stage 3 NSCLC (currently on immunotherapy with Durvalumab), CHF (EF 20-25% per Echo 05/2022), Afib on Xarelto, COPD, CAD, and HTN presenting here with acute hypoxic respiratory failure in the setting of likely CHF exacerbation.  Assessment & Plan Acute hypoxic respiratory failure ([name]) Multifactorial in setting of immunotherapy hx, known lung cancer, HFrEF, amiodarone, COPD, PNA. Appears euvolemic on exam today, with no leg swelling, abdominal distension improved. HHFNC weaned from 13>7. Goals of care conversation today > see Dr. Karen Kitchens note. - Cardiology consulted, recommendations below  - STOP IV diuresis  - START PO Lasix at home dose 20 mg daily  - Repeat Echo due to limited prior study cannot rule out presence of thrombus  - Continue IV Azithromycin (9/25-9/27), and Rocephin (9/24-9/28) - PT/OT - Orthostatics prior to discharge  - Incentive spirometry - Wean oxygen to 6L [name] with humidification and monitor respiratory status - See progress note 9/27 for discussion regarding goals of care Lung cancer [name]) Patient with diagnosis of Stage III NSCLC diagnosed on 06/03/2022 (followed by Dr. Bertis Ruddy), with 4.1 cm R hilar lesion with bilateral bronchial thickening. No evidence of metastatic disease per PET in 06/2022. Currently on chemotherapy with Durvalumab q28d, last dose on 10/01/2022. No recent weight loss, hemoptysis. No HA, abdominal pain, vomiting, diarrhea. Consulted Oncology,  less concern for pneumonitis related to immunotherapy, they recommend outpatient immunotherapy, no further workup/recommendations for inpatient stay.  - Tylenol PRN for pain  Acute on chronic systolic heart failure [name]) Patient with chronic congestive heart failure (EF of 20-25% as of 05/2022). BNP elevated markedly and CXR with pulmonary edema on presentation, but appears dry on exam with cold extremities. Weaned to 4L [name] on 9/25, but hypoxic to 80s with ambulation, placed back on BiPAP overnight 9/25. Now on 3L [name], satting in 90s. Still having conversational dyspnea but no increased work of breathing at rest. Lungs with no wheezing.  Cr. 1.05, K of 3.7, Mg 2.2. Still with no leg swelling, abdomen is soft, distension improved from yesterday. Plan to have void trial 9/28 - Cardiology consulted, recommendations below:  - STOP IV diuresis  - START PO Lasix at home dose 20 mg daily   - RESTART home Entresto 24-26 mg BID - CONTINUE Metoprolol Suc 25 mg daily, Aldactone 25 mg daily - Strict I/Os - 20 meQ oral K - Goal K > 4, Mg > 2 - BMP and Mg in AM Pneumonia Patient with subjective fevers and cough at home, afebrile on presentation. Initial CXR on admission with no evidence of PNA. ProCal 0.81, RPP  negative, COVID negative, MRSA nares negative. Abx initiated on presentation, but discontinued on 9/25 due to clinical improvement. Febrile overnight 9/25 to 101F and patient with hypoxia on ambulation, repeat CXR with evidence of PNA. IV Abx continued 9/26. Continue IV Abx at this time. Less concern for aspiration PNA given no productive cough/purulent sputum. Blood cultures with no growth at 48 hours.  - Continue IV Azithromycin (  9/25-9/27), and Rocephin (9/24-9/28) - Follow up blood cultures  COPD, severe ([name]) Patient with long-standing COPD, home regimen includes Dulera and rescue albuterol inhaler. No increase in cough or sputum production/quality. No wheezing noted on lung exam.  - DuoNebs PRN -  Continue home Dulera Paroxysmal atrial fibrillation [name]) Patient with history of paroxysmal atrial fibrillation, has had ICD placement in 2016. Currently controlled on amiodarone 200 mg BID as well as Xarelto 20 mg daily. Sinus rhythm on presentation, tachycardic to 100, but now HR in 80s. 9/25 overnight became hypoxic and nauseous. Repeat EKG with no ischemia. Uptrending trops to 67, but downtrended on 9/26 (63 > 67 > 62). No chest pain, palpitations.  - Cardiology consulted, recommendations below:  - Continue Amiodarone 200 mg BID  - Continue Xarelto 15 mg daily - Cardiac monitoring Anemia Hgb 13.9 on admission, downtrended to 11.3 9/25, stable this morning with Hgb of 11.7. Patient asymptomatic with no signs or symptoms consistent with active bleed. Will continue to monitor.  - AM CBC  QT prolongation Qtc prolonged to 570 on admission, improved to 512 on 9/24, most recent EKG with Qtc of 558 on 9/26. Patient on amiodarone for atrial fibrillation.  - Per Cardiology, continue Amiodarone due to benefit Urinary retention Patient noted to have urinary retention with foley catheter placed 9/25, 650 cc output. Patient noted blood in foley catheter 9/27 AM. Small amount, with no clots. Denies any pain or burning with urination. Suspect this is due to trauma from foley catheter placement. - Continue to monitor for worsening clinical signs/symptoms.  - START 0.4 mg Flomax for urinary retention - REMOVE Foley catheter after monitoring output after administration of Flomax  - Bladder Scans q4h    Chronic and Stable Issues:  Hyperlipidemia: Continue Atorvastatin 80 mg daily  Constipation: Continue Senna 2 tabs daily  GERD: Continue PO Protonix 40 mg daily  Insomnia: Continue Melatonin 5 mg nightly  FEN/GI: Heart Healthy Diet  PPx: Xarelto Dispo: Home pending clinical improvement  Subjective: Patient is feeling about the same as yesterday. Denies any chest pain, shortness of breath,  palpitations. No leg pain, swelling, abdominal pain. Feels like his abdomen is a little less swollen than yesterday. No abdominal pain, n/v/d. Does report noticing a little blood in his foley catheter, but no dysuria.   See separate note for GOC conversation   Objective: Temp:  [97.8 F (36.6 C)-99.2 F (37.3 C)] 97.8 F (36.6 C) (09/27 0502) Pulse Rate:  [73-86] 73 (09/27 0749) Resp:  [16-24] 21 (09/27 0749) BP: (116-134)/(57-69) 121/69 (09/27 0737) SpO2:  [94 %-100 %] 98 % (09/27 0749) Weight:  [60.3 kg] 60.3 kg (09/27 0502) Physical Exam: General: Non toxic, but frail appearing. Alert and oriented.  Cardiovascular: Heart rate and rhythm regular. No murmurs, rubs, gallops.  Respiratory: Lungs with no focal wheezing, rales, rhonchi. Poor air movement throughout. Has conversational dyspnea but no upper accessory muscle usage with breathing. Currently on 13L [place] high flow.  Abdomen: Non-tender with no rebound or guarding. Distension improved from yesterday, soft.  Extremities: Cool hands and feet, but palpable distal pulses 2+. No lower extremity edema.   Laboratory: Most recent CBC Lab Results  Component Value Date   WBC 12.9 (H) 10/31/2022   HGB 11.7 (L) 10/31/2022   HCT 35.8 (L) 10/31/2022   MCV 94.0 10/31/2022   PLT 179 10/31/2022   Most recent BMP    Latest Ref Rng & Units 11/01/2022    5:45 AM  BMP  Glucose 70 -  99 mg/dL 161   BUN 8 - 23 mg/dL 14   Creatinine 0.96 - 1.24 mg/dL 0.45   Sodium 409 - 811 mmol/L 137   Potassium 3.5 - 5.1 mmol/L 3.7   Chloride 98 - 111 mmol/L 104   CO2 22 - 32 mmol/L 27   Calcium 8.9 - 10.3 mg/dL 8.2    Other Pertinent Labs:  ProCal = 0.81 MRSA nares: negative COVID = negative RPP: negative Lactate: 1.5 BNP (9/24): 2.5K VBG: pCO2 of 43.6, pH 7.33   Troponin: 31 > 33 > 38 > 50 > 63 > 67 > 62  INR: 4.3 Mg: 1.6    Blood Cultures (9/24): No growth at 48 hours   Imaging/Diagnostic Tests: CXR 9/24: Increased interstitial markings,  concerning for superimposed pulmonary edema. CXR 9/26: New airspace disease in L mid lung, R mid lung infiltrate decreased in density but increased in size.    CT PE: No acute pulmonary embolus. Suspected underlying radiation fibrosis, suspected interval development of airspace disease suspicious for superimposed PNA.    [place]y [place], Medical Student 11/01/2022, 11:38 AM  [facility] FPTS Intern pager: [phone], text pages welcome Secure chat group [facility] Teaching Service  Upper Level Addendum:  I have seen and evaluated this patient along with Student Dr. Jon Billings and reviewed the above note, making necessary revisions as appropriate.  I agree with the medical decision making and physical exam as noted above.  Levin Erp, MD PGY-3 [facility] Family [facility] Residency

## 2022-11-01 NOTE — Assessment & Plan Note (Addendum)
Multifactorial in setting of immunotherapy hx, known lung cancer, HFrEF, amiodarone, COPD, PNA. Appears euvolemic on exam today, with no leg swelling, abdominal distension improved. HHFNC weaned from 13>7. Goals of care conversation today > see Dr. Karen Kitchens note. - Cardiology consulted, recommendations below  - STOP IV diuresis  - START PO Lasix at home dose 20 mg daily  - Repeat Echo due to limited prior study cannot rule out presence of thrombus  - Continue IV Azithromycin (9/25-9/27), and Rocephin (9/24-9/28) - PT/OT - Orthostatics prior to discharge  - Incentive spirometry - Wean oxygen to 6L [place] with humidification and monitor respiratory status - See progress note 9/27 for discussion regarding goals of care

## 2022-11-01 NOTE — Assessment & Plan Note (Signed)
Qtc prolonged to 570 on admission, improved to 512 on 9/24, most recent EKG with Qtc of 558 on 9/26. Patient on amiodarone for atrial fibrillation.  - Per Cardiology, continue Amiodarone due to benefit

## 2022-11-01 NOTE — Assessment & Plan Note (Signed)
Hgb 13.9 on admission, downtrended to 11.3 9/25, stable this morning with Hgb of 11.7. Patient asymptomatic with no signs or symptoms consistent with active bleed. Will continue to monitor.  - AM CBC

## 2022-11-02 DIAGNOSIS — J9601 Acute respiratory failure with hypoxia: Secondary | ICD-10-CM | POA: Diagnosis not present

## 2022-11-02 LAB — BASIC METABOLIC PANEL
Anion gap: 7 (ref 5–15)
Anion gap: 11 (ref 5–15)
BUN: 13 mg/dL (ref 8–23)
BUN: 13 mg/dL (ref 8–23)
CO2: 25 mmol/L (ref 22–32)
CO2: 24 mmol/L (ref 22–32)
Calcium: 8.1 mg/dL — ABNORMAL LOW (ref 8.9–10.3)
Calcium: 8.3 mg/dL — ABNORMAL LOW (ref 8.9–10.3)
Chloride: 102 mmol/L (ref 98–111)
Chloride: 102 mmol/L (ref 98–111)
Creatinine, Ser: 0.9 mg/dL (ref 0.61–1.24)
Creatinine, Ser: 0.94 mg/dL (ref 0.61–1.24)
GFR, Estimated: >60 mL/min (ref >60)
GFR, Estimated: >60 mL/min (ref >60)
Glucose, Bld: 123 mg/dL — ABNORMAL HIGH (ref 70–99)
Glucose, Bld: 118 mg/dL — ABNORMAL HIGH (ref 70–99)
Potassium: 3.1 mmol/L — ABNORMAL LOW (ref 3.5–5.1)
Potassium: 4.4 mmol/L (ref 3.5–5.1)
Sodium: 134 mmol/L — ABNORMAL LOW (ref 135–145)
Sodium: 137 mmol/L (ref 135–145)

## 2022-11-02 LAB — CBC
HCT: 33.3 % — ABNORMAL LOW (ref 39.0–52.0)
Hemoglobin: 10.9 g/dL — ABNORMAL LOW (ref 13.0–17.0)
MCH: 30.9 pg (ref 26.0–34.0)
MCHC: 32.7 g/dL (ref 30.0–36.0)
MCV: 94.3 fL (ref 80.0–100.0)
Platelets: 194 10*3/uL (ref 150–400)
RBC: 3.53 MIL/uL — ABNORMAL LOW (ref 4.22–5.81)
RDW: 16.9 % — ABNORMAL HIGH (ref 11.5–15.5)
WBC: 7.5 10*3/uL (ref 4.0–10.5)
nRBC: 0 % (ref 0.0–0.2)

## 2022-11-02 LAB — MAGNESIUM: Magnesium: 1.9 mg/dL (ref 1.7–2.4)

## 2022-11-02 MED ORDER — POTASSIUM CHLORIDE 10 MEQ/100ML IV SOLN
10.0000 meq | Freq: Once | INTRAVENOUS | Status: AC
  Administered 2022-11-02: 10 meq via INTRAVENOUS
  Filled 2022-11-02: qty 100

## 2022-11-02 MED ORDER — POTASSIUM CHLORIDE 20 MEQ PO PACK
40.0000 meq | PACK | Freq: Once | ORAL | Status: AC
  Administered 2022-11-02: 40 meq via ORAL
  Filled 2022-11-02: qty 2

## 2022-11-02 MED ORDER — POTASSIUM CHLORIDE 10 MEQ/100ML IV SOLN
10.0000 meq | INTRAVENOUS | Status: AC
  Administered 2022-11-02 (×4): 10 meq via INTRAVENOUS
  Filled 2022-11-02 (×3): qty 100

## 2022-11-02 MED ORDER — MAGNESIUM SULFATE 2 GM/50ML IV SOLN
2.0000 g | Freq: Once | INTRAVENOUS | Status: AC
  Administered 2022-11-02: 2 g via INTRAVENOUS
  Filled 2022-11-02: qty 50

## 2022-11-02 NOTE — Progress Notes (Signed)
Daily Progress Note Intern Pager: [phone]  Patient name: Benjamin Cook Medical record number: [id number] Date of birth: 1945/04/12 Age: 77 y.o. Gender: male  Primary Care Provider: [name], MD Consultants: Oncology, Cardiology, PAlliative Code Status: DNR/DNI  Pt Overview and Major Events to Date:  9/24 Admitted on Bipap 9/25 Weaned off Bipap 9/27 switch to DNR/DNI  Assessment and Plan: Benjamin Cook is a 77 y.o. male with a past medical history of Stage 3 NSCLC (currently on immunotherapy with Durvalumab), CHF (EF 20-25% per Echo 05/2022), Afib on Xarelto, COPD, CAD, and HTN presenting here with acute hypoxic respiratory failure in the setting of likely CHF exacerbation and PNA.  Assessment & Plan Acute hypoxic respiratory failure ([name]) Multifactorial including chemoradiation, lung cancer, HFrEF, amiodarone, COPD, PNA. Respiratory status is overall much improved compared to admission.  - 8L HFNC, cont to wean as tolerated. May have new O2 requirement at baseline (previously on RA) - Tx PNA as below - Tx CHF as below - Outpt f/u w/ Oncology for further management of lung cancer - DME order placed for home O2 Lung cancer [facility]) Patient with diagnosis of Stage III NSCLC diagnosed on 06/03/2022 (followed by Dr. Bertis Cook), with 4.1 cm R hilar lesion with bilateral bronchial thickening. No evidence of metastatic disease per PET in 06/2022. Currently on chemotherapy with Durvalumab q28d, last dose on 10/01/2022. No recent weight loss, hemoptysis. No HA, abdominal pain, vomiting, diarrhea. Consulted Oncology, less concern for pneumonitis related to immunotherapy, they recommend outpatient immunotherapy, no further workup/recommendations for inpatient stay.  - Tylenol PRN for pain  Acute on chronic systolic heart failure ([name]) Euvolemic on exam. Wt stable today.  - K and Mg repleted, f/u 1pm BMP - Cont home lasix 20mg  daily - Cont home Entresto - Cont home metop - Cont  home spirolactone - Cardiology consulted, appreciate recs - Strict I/Os - Goal K > 4, Mg > 2 - BMP and Mg in AM Pneumonia Remains afebrile. BloodCx NGTD 4days. - Finish CTX today (9/24-9/28) - s/p azithro x3days (9/25-9/27) COPD, severe ([name]) No wheezing on exam today.  - Duonebs prn - Cont home dulera Paroxysmal atrial fibrillation ([name]) Controlled, HR wnl. - Cardiology consulted, appreciate recs - Cont home Amiodarone - Cont home Xarelto - Cardiac monitoring Anemia Some hematuria noted in foley bag, suspect 2/2 catheter insertion trauma.  - f/u 1pm CBC - AM CBC QT prolongation Qtc 558 9/26. - caution with QT prolonging med Urinary retention Started flomax yesterday. Plan for void trial today. - Discontinue foley - bladder scan q4h - Cont flomax 0.4mg  daily Goals of care, counseling/discussion DNR/DNI. Pt and wife requesting palliative consult to guide GOC discussion. - Palliative consult, appreciate recs  Chronic and Stable Problems:  Hyperlipidemia: Continue Atorvastatin 80 mg daily  Constipation: Continue Senna 2 tabs daily  GERD: Continue PO Protonix 40 mg daily  Insomnia: Continue Melatonin 5 mg nightly  FEN/GI: Heart healthy diet PPx: Home Xarelto Dispo:Pending clinical improvement.  Benjamin Cook ongoing hospitalization due to increased O2 demands.  Subjective:  Reports that his breathing feels better today. Reports that he was having some soreness of his R heel, thinks it was from having his leg pressed against the bed board. He also reports some L sided abdominal discomfort this morning after he ate his cereal and ice cream. Had a loose BM this morning (confirmed by nurse). Pt and wife are agreeable to void trial and palliative consult.  Objective: Temp:  [97.5 F (36.4  C)-99.1 F (37.3 C)] 97.5 F (36.4 C) (09/28 0723) Pulse Rate:  [72-83] 72 (09/28 0759) Resp:  [19-25] 24 (09/28 0759) BP: (101-124)/(45-60) 117/60 (09/28 0723) SpO2:  [88 %-100 %] 100 %  (09/28 0759) Weight:  [60 kg] 60 kg (09/28 0444) Physical Exam: General: Alert, speaking in full sentences. Able to sit up. NAD. Cardiovascular: RRR Respiratory: CTAB. Normal WOB on 8L HFNC.  Abdomen: Mild discomfort to palpation in LUQ.  Extremities: Tender to palpation along R heel. No evidence of skin breakdown on R foot. BL LE symmetric, nonedematous, nonerythematous.   Laboratory: Most recent CBC Lab Results  Component Value Date   WBC 12.9 (H) 10/31/2022   HGB 11.7 (L) 10/31/2022   HCT 35.8 (L) 10/31/2022   MCV 94.0 10/31/2022   PLT 179 10/31/2022   Most recent BMP    Latest Ref Rng & Units 11/02/2022    4:19 AM  BMP  Glucose 70 - 99 mg/dL 213   BUN 8 - 23 mg/dL 13   Creatinine 0.86 - 1.24 mg/dL 5.78   Sodium 469 - 629 mmol/L 134   Potassium 3.5 - 5.1 mmol/L 3.1   Chloride 98 - 111 mmol/L 102   CO2 22 - 32 mmol/L 25   Calcium 8.9 - 10.3 mg/dL 8.1    Lincoln Brigham, MD 11/02/2022, 9:17 AM  PGY-2, [facility] FPTS Intern pager: [phone], text pages welcome Secure chat group [facility] Teaching Service

## 2022-11-02 NOTE — Assessment & Plan Note (Addendum)
Controlled, HR wnl. - Cardiology consulted, appreciate recs - Cont home Amiodarone - Cont home Xarelto - Cardiac monitoring

## 2022-11-02 NOTE — Assessment & Plan Note (Addendum)
Some hematuria noted in foley bag, suspect 2/2 catheter insertion trauma.  - f/u 1pm CBC - AM CBC

## 2022-11-02 NOTE — Assessment & Plan Note (Addendum)
No wheezing on exam today.  - Duonebs prn - Cont home dulera

## 2022-11-02 NOTE — Assessment & Plan Note (Addendum)
Remains afebrile. BloodCx NGTD 4days. - Finish CTX today (9/24-9/28) - s/p azithro x3days (9/25-9/27)

## 2022-11-02 NOTE — Assessment & Plan Note (Addendum)
Multifactorial including chemoradiation, lung cancer, HFrEF, amiodarone, COPD, PNA. Respiratory status is overall much improved compared to admission.  - 8L HFNC, cont to wean as tolerated. May have new O2 requirement at baseline (previously on RA) - Tx PNA as below - Tx CHF as below - Outpt f/u w/ Oncology for further management of lung cancer - DME order placed for home O2

## 2022-11-02 NOTE — Assessment & Plan Note (Signed)
DNR/DNI. Pt and wife requesting palliative consult to guide GOC discussion. - Palliative consult, appreciate recs

## 2022-11-02 NOTE — Plan of Care (Signed)
Benjamin Cook

## 2022-11-02 NOTE — Assessment & Plan Note (Signed)
Qtc 558 9/26. - caution with QT prolonging med

## 2022-11-02 NOTE — Assessment & Plan Note (Addendum)
Euvolemic on exam. Wt stable today.  - K and Mg repleted, f/u 1pm BMP - Cont home lasix 20mg  daily - Cont home Entresto - Cont home metop - Cont home spirolactone - Cardiology consulted, appreciate recs - Strict I/Os - Goal K > 4, Mg > 2 - BMP and Mg in AM

## 2022-11-02 NOTE — Assessment & Plan Note (Signed)
Started flomax yesterday. Plan for void trial today. - Discontinue foley - bladder scan q4h - Cont flomax 0.4mg  daily

## 2022-11-02 NOTE — Assessment & Plan Note (Addendum)
Patient with diagnosis of Stage III NSCLC diagnosed on 06/03/2022 (followed by Dr. Bertis Ruddy), with 4.1 cm R hilar lesion with bilateral bronchial thickening. No evidence of metastatic disease per PET in 06/2022. Currently on chemotherapy with Durvalumab q28d, last dose on 10/01/2022. No recent weight loss, hemoptysis. No HA, abdominal pain, vomiting, diarrhea. Consulted Oncology, less concern for pneumonitis related to immunotherapy, they recommend outpatient immunotherapy, no further workup/recommendations for inpatient stay.  - Tylenol PRN for pain

## 2022-11-03 ENCOUNTER — Inpatient Hospital Stay (HOSPITAL_COMMUNITY): Payer: Medicare HMO

## 2022-11-03 DIAGNOSIS — J9601 Acute respiratory failure with hypoxia: Secondary | ICD-10-CM | POA: Diagnosis not present

## 2022-11-03 LAB — CULTURE, BLOOD (ROUTINE X 2)
Culture: NO GROWTH
Culture: NO GROWTH

## 2022-11-03 LAB — BASIC METABOLIC PANEL
Anion gap: 6 (ref 5–15)
BUN: 14 mg/dL (ref 8–23)
CO2: 25 mmol/L (ref 22–32)
Calcium: 7.9 mg/dL — ABNORMAL LOW (ref 8.9–10.3)
Chloride: 104 mmol/L (ref 98–111)
Creatinine, Ser: 0.97 mg/dL (ref 0.61–1.24)
GFR, Estimated: >60 mL/min (ref >60)
Glucose, Bld: 122 mg/dL — ABNORMAL HIGH (ref 70–99)
Potassium: 3.4 mmol/L — ABNORMAL LOW (ref 3.5–5.1)
Sodium: 135 mmol/L (ref 135–145)

## 2022-11-03 LAB — CBC
HCT: 33.3 % — ABNORMAL LOW (ref 39.0–52.0)
Hemoglobin: 10.8 g/dL — ABNORMAL LOW (ref 13.0–17.0)
MCH: 31.4 pg (ref 26.0–34.0)
MCHC: 32.4 g/dL (ref 30.0–36.0)
MCV: 96.8 fL (ref 80.0–100.0)
Platelets: 210 10*3/uL (ref 150–400)
RBC: 3.44 MIL/uL — ABNORMAL LOW (ref 4.22–5.81)
RDW: 16.7 % — ABNORMAL HIGH (ref 11.5–15.5)
WBC: 6.2 10*3/uL (ref 4.0–10.5)
nRBC: 0 % (ref 0.0–0.2)

## 2022-11-03 LAB — MAGNESIUM: Magnesium: 2 mg/dL (ref 1.7–2.4)

## 2022-11-03 MED ORDER — FUROSEMIDE 10 MG/ML IJ SOLN
40.0000 mg | Freq: Once | INTRAMUSCULAR | Status: AC
  Administered 2022-11-03: 40 mg via INTRAVENOUS

## 2022-11-03 MED ORDER — PREDNISONE 20 MG PO TABS
40.0000 mg | ORAL_TABLET | Freq: Every day | ORAL | Status: DC
  Administered 2022-11-03 – 2022-11-06 (×4): 40 mg via ORAL
  Filled 2022-11-03 (×4): qty 2

## 2022-11-03 MED ORDER — POTASSIUM CHLORIDE 20 MEQ PO PACK
40.0000 meq | PACK | Freq: Once | ORAL | Status: AC
  Administered 2022-11-03: 40 meq via ORAL
  Filled 2022-11-03: qty 2

## 2022-11-03 MED ORDER — IPRATROPIUM-ALBUTEROL 0.5-2.5 (3) MG/3ML IN SOLN
3.0000 mL | Freq: Four times a day (QID) | RESPIRATORY_TRACT | Status: DC
  Administered 2022-11-03 – 2022-11-06 (×11): 3 mL via RESPIRATORY_TRACT
  Filled 2022-11-03 (×11): qty 3

## 2022-11-03 MED ORDER — FUROSEMIDE 10 MG/ML IJ SOLN
INTRAMUSCULAR | Status: AC
  Filled 2022-11-03: qty 4

## 2022-11-03 NOTE — Progress Notes (Signed)
Daily Progress Note Intern Pager: [phone]  Patient name: Benjamin Cook Medical record number: [id number] Date of birth: 1945-04-06 Age: 77 y.o. Gender: male  Primary Care Provider: [name], MD Consultants: Oncology, Cardiology, Palliative  Code Status: DNR/DNI   Pt Overview and Major Events to Date:  9/24 Admitted on Bipap 9/25 Weaned off Bipap 9/27 switch to DNR/DNI  Assessment and Plan: [name] is a 77 y.o. admitted for acute hypoxic respiratory failure 2/2 PNA, and CHF exacerbation. Male with PMHx of Stage 3 NSCLC (currently on immunotherapy with Durvalumab), CHF (EF 20-25% per Echo 05/2022), Afib on Xarelto, COPD, CAD, and HTN.  Assessment & Plan Acute hypoxic respiratory failure ([name]) Worsening overnight, escalated to NRB mask. CXR without new interim changes. Unclear cause of worsening status, may no longer be unable to compensate for multifactorial lung disease including chemoradiation, lung cancer, HFrEF, amiodarone, COPD, PNA. - NRB, cont to wean as tolerated, doubt will be able to wean to RA - Tx PNA as below - Tx CHF as below - Outpt f/u w/ Oncology for further management of lung cancer - DME order placed for home O2 Lung cancer [facility]) Oncology recommended to continue outpatient immunotherapy. - Tylenol PRN for pain  Acute on chronic systolic heart failure ([name]) Euvolemic on exam. Respiratory status mildly improved with Lasix overnight. - K and Mg replete as needed - Cont home lasix 20mg  daily - Cont home Entresto - Cont home metop - Cont home spirolactone - Cardiology consulted, appreciate recs - Strict I/Os - Goal K > 4, Mg > 2 - BMP and Mg in AM Pneumonia Remains afebrile. BloodCx NGTD 5 days. - CTX course complete (9/24-9/28) - s/p azithro x3days (9/25-9/27) COPD, severe ([name]) No wheezing on exam today.  - Duonebs prn - Cont home dulera Paroxysmal atrial fibrillation ([name]) Controlled, HR wnl. - Cardiology consulted,  appreciate recs - Cont home Amiodarone - Cont home Xarelto - Cardiac monitoring Anemia Some hematuria noted in foley bag, suspect 2/2 catheter insertion trauma. CBC stable.  - AM CBC QT prolongation Qtc 558 9/26. - caution with QT prolonging med Urinary retention U/O decreased after discontinuation of foley. Mild volume release with lasix. - bladder scan q4h - Cont flomax 0.4mg  daily, consider increasing Goals of care, counseling/discussion DNR/DNI. - Palliative consult, appreciate recs  Chronic and Stable Problems:  Hyperlipidemia: Continue Atorvastatin 80 mg daily  Constipation: Continue Senna 2 tabs daily  GERD: Continue PO Protonix 40 mg daily  Insomnia: Continue Melatonin 5 mg nightly   FEN/GI: Heart healthy diet  PPx: Home Xarelto  Dispo: Pending clinical improvement.  Subjective:  Overnight, increased WOB and oxygen requirement to using NRB. Feels he is doing better, but is still on NRB.   Objective: Temp:  [97.5 F (36.4 C)-98.7 F (37.1 C)] 98.3 F (36.8 C) (09/29 0100) Pulse Rate:  [37-98] 82 (09/29 0100) Resp:  [13-37] 20 (09/29 0100) BP: (101-149)/(53-85) 149/85 (09/29 0100) SpO2:  [53 %-100 %] 98 % (09/29 0100) Weight:  [60 kg] 60 kg (09/28 0444) Physical Exam: General: NAD  Neuro: A&O Cardiovascular: RRR, no murmurs, no peripheral edema Respiratory: increased WOB on NRB, CTAB, no wheezes, ronchi or rales Abdomen: soft, NTTP, no rebound or guarding Extremities: Moving all 4 extremities equally   Laboratory: Most recent CBC Lab Results  Component Value Date   WBC 7.5 11/02/2022   HGB 10.9 (L) 11/02/2022   HCT 33.3 (L) 11/02/2022   MCV 94.3 11/02/2022   PLT 194  11/02/2022   Most recent BMP    Latest Ref Rng & Units 11/02/2022    1:58 PM  BMP  Glucose 70 - 99 mg/dL 409   BUN 8 - 23 mg/dL 13   Creatinine 8.11 - 1.24 mg/dL 9.14   Sodium 782 - 956 mmol/L 137   Potassium 3.5 - 5.1 mmol/L 4.4   Chloride 98 - 111 mmol/L 102   CO2 22 - 32  mmol/L 24   Calcium 8.9 - 10.3 mg/dL 8.3     Other pertinent labs: none   Imaging/Diagnostic Tests: CXR - Bilateral middle lung field opacities, improved haziness from previous XR.  No change since previous study. Residual fibrosis or infiltration in the mid lungs. Possible post treatment changes in the right hilum.  Celine Mans, MD 11/03/2022, 2:14 AM  PGY-2, [facility] FPTS Intern pager: [phone], text pages welcome Secure chat group [facility] Teaching Service

## 2022-11-03 NOTE — Assessment & Plan Note (Addendum)
Worsening overnight, escalated to NRB mask. CXR without new interim changes. Unclear cause of worsening status, may no longer be unable to compensate for multifactorial lung disease including chemoradiation, lung cancer, HFrEF, amiodarone, COPD, PNA. - NRB, cont to wean as tolerated, doubt will be able to wean to RA - Tx PNA as below - Tx CHF as below - Outpt f/u w/ Oncology for further management of lung cancer - DME order placed for home O2

## 2022-11-03 NOTE — Assessment & Plan Note (Signed)
Controlled, HR wnl. - Cardiology consulted, appreciate recs - Cont home Amiodarone - Cont home Xarelto - Cardiac monitoring

## 2022-11-03 NOTE — Progress Notes (Signed)
FMTS Brief Progress Note  S:Called to bedside for increasing O2 requirement. Was previously on 8L HFNC but then desatted to the 70s after what he described as a "choking" event where he "got all caught up in this (the monitors/lines/gown)." RT and RN tried increasing HFNC to 15L with O2 sats only recovering to 80s. They trialed BiPAP but he was intolerant of this. He is now on NRB and satting 100%.  RN notes that his UOP has fallen off over the past few days since his IV lasix was discontinued.  Patient describes this "choking" event and says his breathing is improved since transitioning to the NRB. No other acute complaints at present other than wanting to sleep.    O: BP (!) 149/85 (BP Location: Left Arm)   Pulse 82   Temp 98.3 F (36.8 C) (Oral)   Resp 20   Ht 5\' 10"  (1.778 m)   Wt 60 kg   SpO2 98%   BMI 18.98 kg/m   Gen: Chronically ill appearing, alert and conversant  Cardio: RRR Pulm: Tachypneic to mid-high 20s, lung sounds diminished throughout, no wheezes appreciated  A/P: Increased O2 Requirement It sounds like this choking event woke him from sleep. Looking back it does seem that he tends to worsen overnight. ?sleep apnea vs some aspiration. CXR obtained which actually looks a bit better compared to film from three days ago. His last bladder scans have been appropriate.  - Given decreased UOP and correlated increase in WOB/O2 requirement, I think a one time dose of IV lasix is a reasonable trial for now - Has completed Abx for his PNA, do not see any reason to add these back at this time  - Should his WOB remain increased and we need to put him back on BiPAP, we will need to re-engage discussions around GOC because I suspect putting him on BiPAP would require some anxiolysis which would, of course, run the risk of decreasing his intrinsic respiratory drive.   Alicia Amel, MD 11/03/2022, 1:20 AM PGY-3, [facility] Night Resident  Please page [phone] with  questions.

## 2022-11-03 NOTE — Progress Notes (Signed)
FMTS Brief Progress Note  S:Went bedside to see patient w/ Dr. Fatima Blank, patient laying comfortably in bed. Report's breathing has been doing well this morning, and feels good on HFNC.   O: BP (!) 107/58 (BP Location: Left Arm)   Pulse 87   Temp 97.6 F (36.4 C) (Oral)   Resp (!) 21   Ht 5\' 10"  (1.778 m)   Wt 59 kg   SpO2 98%   BMI 18.66 kg/m   General: NAD, elderly, frail Resp: Coarse breath sounds, good wob  A/P: AHRF Respiratory situation appears to be stable today, patient had difficult night requiring escalation of respiratory care. Patient is DNR/DNI, thus highest level of respiratory support will be BiPAP.  Will have GOC conversation with patient/family if needing greater respiratory support than BiPAP. Will continue HFNC overnight. Respiratory status thought to be 2/2 COPD/CHF/Lung Cancer/PNA. -Continue HFNC -Plans per day team   - Orders reviewed. Labs for AM ordered, which was adjusted as needed.   Bess Kinds, MD 11/03/2022, 8:38 PM PGY-3, [place] [facility] Night Resident  Please page [phone] with questions.

## 2022-11-03 NOTE — Assessment & Plan Note (Signed)
Qtc 558 9/26. - caution with QT prolonging med

## 2022-11-03 NOTE — Assessment & Plan Note (Addendum)
Euvolemic on exam. Respiratory status mildly improved with Lasix overnight. - K and Mg replete as needed - Cont home lasix 20mg  daily - Cont home Entresto - Cont home metop - Cont home spirolactone - Cardiology consulted, appreciate recs - Strict I/Os - Goal K > 4, Mg > 2 - BMP and Mg in AM

## 2022-11-03 NOTE — Assessment & Plan Note (Signed)
DNR/DNI. - Palliative consult, appreciate recs

## 2022-11-03 NOTE — Progress Notes (Signed)
Called to bedside for desat. RN placed pt on NRBM. O2 Sats improved but pt work of breathing increased and RR low 30s. Attempted to place pt on BiPAP but pt did not tol. Pt on NRBM when MDs arrived @bedside .

## 2022-11-03 NOTE — Assessment & Plan Note (Addendum)
Remains afebrile. BloodCx NGTD 5 days. - CTX course complete (9/24-9/28) - s/p azithro x3days (9/25-9/27)

## 2022-11-03 NOTE — Assessment & Plan Note (Signed)
Oncology recommended to continue outpatient immunotherapy. - Tylenol PRN for pain

## 2022-11-03 NOTE — Assessment & Plan Note (Addendum)
U/O decreased after discontinuation of foley. Mild volume release with lasix. - bladder scan q4h - Cont flomax 0.4mg  daily, consider increasing

## 2022-11-03 NOTE — Progress Notes (Signed)
Went to bedside, patient unable to tolerate trial of HFNC, but oxygenating well on NRB. Unable to tolerate Bipap last night. DNI. Consider CTA although on Xarelto. Another dose of Lasix IV? Although not thought to be helpful last night. Palliative consult.   Alfredo Martinez, MD

## 2022-11-03 NOTE — Assessment & Plan Note (Signed)
No wheezing on exam today.  - Duonebs prn - Cont home dulera

## 2022-11-03 NOTE — Progress Notes (Signed)
Pt weaned from nrb to 10 lpm hfnc. Tolerating well at this time. Pt states he is comfortable.

## 2022-11-03 NOTE — Assessment & Plan Note (Signed)
Some hematuria noted in foley bag, suspect 2/2 catheter insertion trauma. CBC stable.  - AM CBC

## 2022-11-04 ENCOUNTER — Ambulatory Visit
Admission: RE | Admit: 2022-11-04 | Discharge: 2022-11-04 | Disposition: A | Discharge status: Home / Self Care | Payer: Medicare HMO | Source: Ambulatory Visit | Attending: Hematology and Oncology | Admitting: Hematology and Oncology

## 2022-11-04 DIAGNOSIS — J9601 Acute respiratory failure with hypoxia: Secondary | ICD-10-CM | POA: Diagnosis not present

## 2022-11-04 DIAGNOSIS — C3480 Malignant neoplasm of overlapping sites of unspecified bronchus and lung: Secondary | ICD-10-CM | POA: Insufficient documentation

## 2022-11-04 DIAGNOSIS — Z515 Encounter for palliative care: Secondary | ICD-10-CM

## 2022-11-04 DIAGNOSIS — Z510 Encounter for antineoplastic radiation therapy: Secondary | ICD-10-CM | POA: Insufficient documentation

## 2022-11-04 DIAGNOSIS — Z7189 Other specified counseling: Secondary | ICD-10-CM

## 2022-11-04 LAB — CBC
HCT: 33.5 % — ABNORMAL LOW (ref 39.0–52.0)
Hemoglobin: 11 g/dL — ABNORMAL LOW (ref 13.0–17.0)
MCH: 31.6 pg (ref 26.0–34.0)
MCHC: 32.8 g/dL (ref 30.0–36.0)
MCV: 96.3 fL (ref 80.0–100.0)
Platelets: 236 10*3/uL (ref 150–400)
RBC: 3.48 MIL/uL — ABNORMAL LOW (ref 4.22–5.81)
RDW: 16.4 % — ABNORMAL HIGH (ref 11.5–15.5)
WBC: 6.5 10*3/uL (ref 4.0–10.5)
nRBC: 0 % (ref 0.0–0.2)

## 2022-11-04 LAB — BASIC METABOLIC PANEL
Anion gap: 10 (ref 5–15)
BUN: 17 mg/dL (ref 8–23)
CO2: 24 mmol/L (ref 22–32)
Calcium: 8.5 mg/dL — ABNORMAL LOW (ref 8.9–10.3)
Chloride: 103 mmol/L (ref 98–111)
Creatinine, Ser: 0.97 mg/dL (ref 0.61–1.24)
GFR, Estimated: >60 mL/min (ref >60)
Glucose, Bld: 134 mg/dL — ABNORMAL HIGH (ref 70–99)
Potassium: 4 mmol/L (ref 3.5–5.1)
Sodium: 137 mmol/L (ref 135–145)

## 2022-11-04 MED ORDER — RIVAROXABAN 20 MG PO TABS
20.0000 mg | ORAL_TABLET | Freq: Every day | ORAL | Status: DC
  Administered 2022-11-04: 20 mg via ORAL
  Filled 2022-11-04: qty 1

## 2022-11-04 MED ORDER — GABAPENTIN 100 MG PO CAPS
100.0000 mg | ORAL_CAPSULE | Freq: Every day | ORAL | Status: DC
  Administered 2022-11-04 – 2022-11-05 (×2): 100 mg via ORAL
  Filled 2022-11-04 (×2): qty 1

## 2022-11-04 MED ORDER — SPIRONOLACTONE 12.5 MG HALF TABLET
12.5000 mg | ORAL_TABLET | Freq: Every day | ORAL | Status: DC
  Administered 2022-11-05 – 2022-11-06 (×2): 12.5 mg via ORAL
  Filled 2022-11-04 (×2): qty 1

## 2022-11-04 MED ORDER — METOPROLOL SUCCINATE ER 25 MG PO TB24
12.5000 mg | ORAL_TABLET | Freq: Two times a day (BID) | ORAL | Status: DC
  Administered 2022-11-04 – 2022-11-06 (×4): 12.5 mg via ORAL
  Filled 2022-11-04 (×4): qty 1

## 2022-11-04 NOTE — Assessment & Plan Note (Addendum)
No wheezing on exam today. Patient with no shortness of breath, change in cough/sputum production.  - Duonebs PRN - Continue home [facility]

## 2022-11-04 NOTE — Progress Notes (Signed)
Occupational Therapy Treatment Patient Details Name: Benjamin Cook MRN: [id number] DOB: 11-22-1945 Today's Date: 11/04/2022   History of present illness 77 yo male presents to [facility] on 9/24 from home with SOB, workup for acute hypoxic respiratory failure secondary to HF exacerbation. Respiratory distress event 9/25 requiring bipap. Recent admission for covid +. PMH includes lung cancer dx 05/2022, AICD, anxiety, OA, CAD, CHF with EF 20-25%, COPD, HTN, HLD, PVD.   OT comments  Focused session on increasing activity tolerance with ADLs at sink and mobility in room using RW, as well as implementing energy conservation strategies as needed. Overall, pt requires CGA to Min A for mobility with RW, Min-Mod A for LB ADLs in standing due to balance deficits and fatigue in standing.   Pt able to tolerate session with 4-5 L O2. BP soft 90s/50s      If plan is discharge home, recommend the following:  A little help with walking and/or transfers;A little help with bathing/dressing/bathroom;Assistance with cooking/housework;Assist for transportation;Help with stairs or ramp for entrance   Equipment Recommendations  BSC/3in1;Tub/shower seat (if does not already have)    Recommendations for Other Services      Precautions / Restrictions Precautions Precautions: Fall Precaution Comments: monitor O2, monitor BP Restrictions Weight Bearing Restrictions: No       Mobility Bed Mobility Overal bed mobility: Needs Assistance Bed Mobility: Supine to Sit     Supine to sit: Supervision          Transfers Overall transfer level: Needs assistance Equipment used: Rolling walker (2 wheels) Transfers: Sit to/from Stand Sit to Stand: Contact guard assist                 Balance Overall balance assessment: Needs assistance Sitting-balance support: No upper extremity supported, Feet supported Sitting balance-Leahy Scale: Fair     Standing balance support: No upper extremity supported,  During functional activity, Bilateral upper extremity supported Standing balance-Leahy Scale: Poor                             ADL either performed or assessed with clinical judgement   ADL Overall ADL's : Needs assistance/impaired     Grooming: Contact guard assist;Standing;Wash/dry face Grooming Details (indicate cue type and reason): for balance     Lower Body Bathing: Moderate assistance;Sitting/lateral leans;Sit to/from stand Lower Body Bathing Details (indicate cue type and reason): assist for balance in standing and posterior hygiene Upper Body Dressing : Minimal assistance;Sitting                   Functional mobility during ADLs: Minimal assistance;Rolling walker (2 wheels) General ADL Comments: Limited by quick respiratory fatigue with activity. provided energy conservation education handout    Extremity/Trunk Assessment Upper Extremity Assessment Upper Extremity Assessment: Right hand dominant;Generalized weakness   Lower Extremity Assessment Lower Extremity Assessment: Defer to PT evaluation        Vision   Vision Assessment?: No apparent visual deficits   Perception     Praxis      Cognition Arousal: Alert Behavior During Therapy: WFL for tasks assessed/performed Overall Cognitive Status: Within Functional Limits for tasks assessed                                          Exercises      Shoulder Instructions  General Comments on 7 L O2 on entry with O2 100% at rest. Able to tolerate light activity on 4 L O2 OOB with bump up to 5 L to assist in recovery after walking across room. left back on 4 L O2 at rest in recliner. BP low 90s/50s - RN present and aware. OT assisted to switch from high flow to regular nasal canula    Pertinent Vitals/ Pain       Pain Assessment Pain Assessment: No/denies pain  Home Living                                          Prior Functioning/Environment               Frequency  Min 1X/week        Progress Toward Goals  OT Goals(current goals can now be found in the care plan section)  Progress towards OT goals: Progressing toward goals  Acute Rehab OT Goals Patient Stated Goal: wean off of O2 if able OT Goal Formulation: With patient Time For Goal Achievement: 11/14/22 Potential to Achieve Goals: Good ADL Goals Pt Will Perform Lower Body Bathing: with modified independence;sit to/from stand Pt Will Perform Lower Body Dressing: with modified independence;sit to/from stand Pt Will Transfer to Toilet: with modified independence;ambulating;regular height toilet Pt Will Perform Toileting - Clothing Manipulation and hygiene: with modified independence;sit to/from stand;sitting/lateral leans Pt/caregiver will Perform Home Exercise Program: Increased strength;Both right and left upper extremity;With theraband;With theraputty;Independently;With written HEP provided Additional ADL Goal #1: Pt will demonstrate ability to Independently state 4 energy conservation strategies for increased safety and independence with functional tasks in the home.  Plan      Co-evaluation                 AM-PAC OT "6 Clicks" Daily Activity     Outcome Measure   Help from another person eating meals?: None Help from another person taking care of personal grooming?: A Little Help from another person toileting, which includes using toliet, bedpan, or urinal?: A Little Help from another person bathing (including washing, rinsing, drying)?: A Little Help from another person to put on and taking off regular upper body clothing?: A Little Help from another person to put on and taking off regular lower body clothing?: A Little 6 Click Score: 19    End of Session Equipment Utilized During Treatment: Gait belt;Rolling walker (2 wheels);Oxygen  OT Visit Diagnosis: Other abnormalities of gait and mobility (R26.89);Muscle weakness (generalized) (M62.81);Other  (comment)   Activity Tolerance Patient tolerated treatment well   Patient Left in chair;with call bell/phone within reach;with chair alarm set   Nurse Communication Mobility status        Time: 1307-1400 OT Time Calculation (min): 53 min  Charges: OT General Charges $OT Visit: 1 Visit OT Treatments $Self Care/Home Management : 23-37 mins $Therapeutic Activity: 8-22 mins  Bradd Canary, OTR/L Acute Rehab Services Office: [phone]   Lorre Munroe 11/04/2022, 2:13 PM

## 2022-11-04 NOTE — Assessment & Plan Note (Addendum)
EF of 25% during admission, similar to prior. Appears euvolemic on exam this morning, no complaints of chest pain, shortness of breath, leg pain/swelling. Wt stable. Cr stable 0.97, K of 4.0 this morning. BP continue to be soft this AM, but stable MAPs. As patient is at high risk of orthostasis/falls, will de-escalate GDMT given soft blood pressures.  - BMP and Mg in AM - Goal K > 4, Mg > 2 - Continue home Lasix 20 mg daily - Continue home Entresto 24-26 mg BID - REDUCE home Metoprolol Suc 12.5 mg BID - REDUCE home Spironolactone 12.5 mg daily - Strict I/Os

## 2022-11-04 NOTE — Progress Notes (Addendum)
Daily Progress Note Intern Pager: [phone]  Patient name: Benjamin Cook Medical record number: [id number] Date of birth: 06-17-1945 Age: 77 y.o. Gender: male  Primary Care Provider: [name], MD Consultants: Oncology, Cardiology, Palliative Code Status: DNR/DNI  Pt Overview and Major Events to Date:  9/24: Admitted on BiPAP 9/25: Weaned off BiPAP 9/27: Transitioned to DNR/DNI 9/29: Titrated up to NRB 9/30: Weaned to HFNC  Assessment and Plan:  AMEIR Cook is a 77 y.o. admitted for acute hypoxic respiratory failure 2/2 PNA, and CHF exacerbation. Male with PMHx of Stage 3 NSCLC (currently on immunotherapy with Durvalumab), CHF (EF 20-25% per Echo 05/2022), Afib on Xarelto, COPD, CAD, and HTN here for acute hypoxic respiratory failure.  Assessment & Plan Acute hypoxic respiratory failure ([name]) Improved since yesterday, breathing comfortably on 8L HFNC. Improvement after initiation of steroids on 9/29. No focal adventitious lung sounds on exam. Etiology of respiratory status is multifactorial including worsening ability to compensate for underlying lung disease, chemoradiation, lung cancer, HFrEF, amiodarone therapy, COPD, and PNA.  - Continue to wean O2 as tolerated, doubt will be able to wean to RA - Continue Prednisone 40 mg x5day course (9/29-10/3) - Treatment of CHF as below - Treatment of PNA as below - Outpatient follow up with Oncology for further management of lung cancer - DME order for home O2 Lung cancer [facility]) Patient with Stage III NSCLC (currently on immunotherapy Durvalumab q28d), no evidence of metastatic disease. Oncology recommended to continue outpatient immunotherapy. - Tylenol PRN for pain  Acute on chronic systolic heart failure ([name]) EF of 25% during admission, similar to prior. Appears euvolemic on exam this morning, no complaints of chest pain, shortness of breath, leg pain/swelling. Wt stable. Cr stable 0.97, K of 4.0 this morning. BP  continue to be soft this AM, but stable MAPs. As patient is at high risk of orthostasis/falls, will de-escalate GDMT given soft blood pressures.  - BMP and Mg in AM - Goal K > 4, Mg > 2 - Continue home Lasix 20 mg daily - Continue home Entresto 24-26 mg BID - REDUCE home Metoprolol Suc 12.5 mg BID - REDUCE home Spironolactone 12.5 mg daily - Strict I/Os Pneumonia Patient remains afebrile, feeling improved today as well. No leukocytosis on CBC. Antibiotic course completed.  - CTX course complete (9/24-9/28) - Azithromycin course complete (9/25-9/27) COPD, severe ([name]) No wheezing on exam today. Patient with no shortness of breath, change in cough/sputum production.  - Duonebs PRN - Continue home Dulera Paroxysmal atrial fibrillation ([name]) Currently controlled on home regimen of amiodarone 200 mg BID. Also on Xarelto 20 mg daily for anticoagulation. HR WNL overnight in the 70s-80s. Patient asymptomatic.  - Cardiology consulted, appreciate recs - Continue Amiodarone 200 mg BID - Continue Xarelto 15 mg daily - Cardiac monitoring Anemia Some hematuria noted in foley catheter. Foley catheter removed 9/27. Intermittent hematuria today as well, otherwise no symptoms. Suspect due to trauma from foley catheter placement. CBC stable, with Hgb of 11.0.  - AM CBC QT prolongation Qtc 558 9/26. Patient asymptomatic.  - Caution with Qtc prolonging medications Urinary retention U/O decreased after discontinuation of foley. Mild volume release with Lasix. Bladder scans overnight with volume <300 cc. Low suspicion for retention at this time.  - Continue Flomax 0.4mg  daily Goals of care, counseling/discussion DNR/DNI. - Palliative consult, appreciate recommendations   Chronic and Stable Issues: Hyperlipidemia: Continue Atorvastatin 80 mg daily  Constipation: Continue Senna 2 tabs daily  GERD:  Continue PO Protonix 40 mg daily  Insomnia: Continue Melatonin 5 mg nightly  FEN/GI: Heart Healthy  Diet PPx: Home Xarelto Dispo: Home with home health pending clinical improvement.   Subjective:  Patient is feeling improved from yesterday, breathing comfortably on 8LNC. Denies any shortness of breath, chest pain, palpitations. No nausea, vomiting, abdominal pain, leg pain/swelling. No HA, weakness. Has been able to stand with PT/OT.   Objective: Temp:  [97.4 F (36.3 C)-97.9 F (36.6 C)] 97.6 F (36.4 C) (09/30 0728) Pulse Rate:  [73-87] 75 (09/30 0728) Resp:  [19-21] 20 (09/30 0728) BP: (107-126)/(57-63) 126/57 (09/30 0728) SpO2:  [91 %-100 %] 100 % (09/30 0728) FiO2 (%):  [100 %] 100 % (09/29 1400) Weight:  [59.1 kg] 59.1 kg (09/30 0107) Physical Exam: General: NAD, non-toxic appearing, alert and oriented.  Cardiovascular: HRRR, no murmurs, rubs, gallops.  Respiratory: No increased work of breathing, no conversational dyspnea or upper accessory muscle usage. Comfortable on 8L [place], lungs with no focal wheezing, rales, rhonchi.  Abdomen: Soft, non-tender, non-distended, no rebound or guarding.  Extremities: Warm and well perfused with no LE edema.   Laboratory: Most recent CBC Lab Results  Component Value Date   WBC 6.5 11/04/2022   HGB 11.0 (L) 11/04/2022   HCT 33.5 (L) 11/04/2022   MCV 96.3 11/04/2022   PLT 236 11/04/2022   Most recent BMP    Latest Ref Rng & Units 11/04/2022    5:04 AM  BMP  Glucose 70 - 99 mg/dL 528   BUN 8 - 23 mg/dL 17   Creatinine 4.13 - 1.24 mg/dL 2.44   Sodium 010 - 272 mmol/L 137   Potassium 3.5 - 5.1 mmol/L 4.0   Chloride 98 - 111 mmol/L 103   CO2 22 - 32 mmol/L 24   Calcium 8.9 - 10.3 mg/dL 8.5     Other pertinent labs none   Imaging/Diagnostic Tests: CXR 9/29: Bilateral middle lung field opacities, improved haziness from previous XR. Residual fibrosis or infiltration in the mid lungs, possible post treatment changes in the R hilum.    [place], Medical Student 11/04/2022, 7:49 AM  I was personally present and performed  or re-performed the history, physical exam and medical decision making activities of this service and have verified that the service and findings are accurately documented in the student's note.  Lincoln Brigham, MD                  11/04/2022, 4:02 PM   PGY-2, [facility] FPTS Intern pager: [phone], text pages welcome Secure chat group [facility] Teaching Service

## 2022-11-04 NOTE — Assessment & Plan Note (Addendum)
Some hematuria noted in foley catheter. Foley catheter removed 9/27. Intermittent hematuria today as well, otherwise no symptoms. Suspect due to trauma from foley catheter placement. CBC stable, with Hgb of 11.0.  - AM CBC

## 2022-11-04 NOTE — Assessment & Plan Note (Addendum)
DNR/DNI. - Palliative consult, appreciate recommendations

## 2022-11-04 NOTE — Progress Notes (Signed)
Radiation Oncology         [phone] ________________________________  Name: Benjamin Cook MRN: [id number]  Date of Service: 11/04/2022  DOB: 06-27-1945  Post Treatment Telephone Note  Diagnosis:  Stage IIIC, cT2N2M0, NSCLC, involving a suprahilar mass in the right lung (as documented in provider EOT note)  The patient was not available for call today.   The patient has scheduled follow up with his medical oncologist Dr. Bertis Ruddy for ongoing care, and was encouraged to call if he develops concerns or questions regarding radiation.    Ruel Favors, LPN

## 2022-11-04 NOTE — Consult Note (Signed)
Palliative Care Consult Note                                  Date: 11/04/2022   Patient Name: Benjamin Cook  DOB: December 10, 1945  MRN: [id number]  Age / Sex: 77 y.o., male  PCP: Benjamin Hack, MD Referring Physician: Carney Living, MD  Reason for Consultation: Establishing goals of care  HPI/Patient Profile: 77 y.o. male  with past medical history of Stage 3 NSCLC (currently on immunotherapy with Durvalumab), CHF (EF 20-25% per Echo 05/2022), Afib on Xarelto, COPD, CAD, and HTN admitted on 10/29/2022 with worsening shortness of breath.  He is being treated for acute hypoxic respiratory failure due to pneumonia and CHF exacerbation.  Past Medical History:  Diagnosis Date   AAA (abdominal aortic aneurysm) ([name])    Adrenal mass ([name])    per pt this is remote (10 years) and benign by biopsy   AICD (automatic cardioverter/defibrillator) present    Anxiety    Arthritis    CAD (coronary artery disease)    a. s/p PCI to LAD 2001 b. myoview 2014 high risk with scar LAD/RCA territory but no ischemia   Cardiomyopathy, ischemic    Chronic HFrEF (heart failure with reduced ejection fraction) ([name])    COPD (chronic obstructive pulmonary disease) ([name])    smokes cigars but has quit cigarettes   Defibrillator discharge 04/21/2019   Diverticulitis    Dyspnea    Flu 03/2015   HTN (hypertension)    Hyperlipidemia    Paroxysmal atrial fibrillation ([name]) 03/2015   chads2vasc score is 4   PSA (psoriatic arthritis) ([name])    increased   PVD (peripheral vascular disease) ([name])    VT (ventricular tachycardia) ([name])     Subjective:   I have reviewed medical records including EPIC notes, labs and imaging, assessed the patient and then met with the patient and his spouse Benjamin in his room to discuss diagnosis prognosis, GOC, EOL wishes, disposition and options.  I introduced Palliative Medicine as specialized medical care for people  Cook with serious illness. It focuses on providing relief from symptoms and stress of a serious illness. The goal is to improve quality of life for both the patient and the family.  Patient faces treatment option decisions, advanced directive decisions, and anticipatory care needs.  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  The patient and his spouse have a good understanding of his chronic and acute illness. We discussed the disease trajectories of his conditions. He has already completed chemotherapy and radiation for his lung cancer and is now on an immunotherapy. He had a difficult time with radiation. Since chemotherapy he has had difficulty with the taste of foods.  We discussed ways patients can address this. He also describes neuropathy in his hands since his treatment. We discussed potentially starting gabapentin for this. Since starting his cancer treatment many of his heart failure medications have been discontinued or changed. He feels this has worsened his heart failure and his symptom burden.  He is thinking about stopping his immunotherapy but would like to speak with his oncologist first. I encouraged him to discuss options before making decisions. We discussed the importance of considering his quality of life, overall symptom  burden, what would be acceptable to him in the future.   We discussed seeing outpatient palliative at the cancer center. He is agreeable to this.  Confirmed his DNR and DNI status. He notes he would never want a feeding tube.   Discussed the importance of continued conversation with family and the medical providers regarding overall plan of care and treatment options, ensuring decisions are within the context of the patient's values and GOCs.  Questions and  concerns were addressed. The family was encouraged to call with questions or concerns. PMT will continue to support holistically.  1400: Went back to patient room to tell him about addition of gabapentin.  Review of Systems  Constitutional:  Positive for appetite change and fatigue.  Respiratory:  Positive for shortness of breath.   Neurological:        Neuropathy in hands    Objective:   Primary Diagnoses: Present on Admission:  Acute hypoxic respiratory failure ([name])  Lung cancer ([name])  Acute on chronic systolic heart failure ([name])  COPD, severe ([name])  Paroxysmal atrial fibrillation ([name])  Acute hypoxemic respiratory failure ([name])  Anemia  Pneumonia  Malnutrition of moderate degree   Physical Exam Vitals reviewed.  Constitutional:      Interventions: Nasal cannula in place.  HENT:     Head: Normocephalic and atraumatic.  Cardiovascular:     Rate and Rhythm: Normal rate.  Pulmonary:     Effort: Pulmonary effort is normal.  Skin:    General: Skin is warm and dry.  Neurological:     Mental Status: He is alert and oriented to person, place, and time.  Psychiatric:        Mood and Affect: Mood normal.        Behavior: Behavior normal.     Vital Signs:  BP (!) 105/44   Pulse 78   Temp 97.7 F (36.5 C) (Axillary)   Resp 20   Ht 5\' 10"  (1.778 m)   Wt 59.1 kg   SpO2 100%   BMI 18.69 kg/m    Advanced Care Planning:   Existing Vynca/ACP Documentation: HCPOA and Cook will  Primary Decision Maker: PATIENT. Patient is able to make decsions. His healthcare proxies are Benjamin Cook (wife) and Benjamin Cook (Daughter)  Code Status/Advance Care Planning: DNR   Assessment & Plan:   SUMMARY OF RECOMMENDATIONS   DNR/DNI Treat the treatable Patient is considering stopping his immunotherapy but wants to discuss with his oncologist Follow up with Benjamin Cook PMT at [facility] Continued PMT support  Symptom Management:  Neuropathy in hands.  Will add gabapentin 100 mg every night to see how he responds.   Discussed with: Attending team, pharmacist, and bedside RN  Time Total: 60 minutes  Thank you for allowing [place] to participate in the care of Benjamin Cook PMT will continue to support holistically.   Signed by: Sarina Ser, NP Palliative Medicine Team  Team Phone # [phone] (Nights/Weekends)  11/04/2022, 12:46 PM

## 2022-11-04 NOTE — Assessment & Plan Note (Addendum)
Currently controlled on home regimen of amiodarone 200 mg BID. Also on Xarelto 20 mg daily for anticoagulation. HR WNL overnight in the 70s-80s. Patient asymptomatic.  - Cardiology consulted, appreciate recs - Continue Amiodarone 200 mg BID - Continue Xarelto 15 mg daily - Cardiac monitoring

## 2022-11-04 NOTE — Assessment & Plan Note (Addendum)
Patient with Stage III NSCLC (currently on immunotherapy Durvalumab q28d), no evidence of metastatic disease. Oncology recommended to continue outpatient immunotherapy. - Tylenol PRN for pain

## 2022-11-04 NOTE — Assessment & Plan Note (Addendum)
Qtc 558 9/26. Patient asymptomatic.  - Caution with Qtc prolonging medications

## 2022-11-04 NOTE — Assessment & Plan Note (Addendum)
Patient remains afebrile, feeling improved today as well. No leukocytosis on CBC. Antibiotic course completed.  - CTX course complete (9/24-9/28) - Azithromycin course complete (9/25-9/27)

## 2022-11-04 NOTE — Progress Notes (Signed)
Heart Failure Stewardship Pharmacist Progress Note   PCP: [name], MD PCP-Cardiologist: Olga Millers, MD    HPI:  77 yo M with PMH of CHF, afib, COPD, CAD, HTN, and stage 3 NSCLC.  Cardiac history dates back to June of 2001. At that time the patient was found to have an abnormal EKG. Catheterization revealed a 95% mid LAD and a 70% diffuse right coronary artery and underwent PCI of LAD. His EF was 25-30%.   Presented to the ED on 9/24 with shortness of breath for 2 months. CXR with concern for pulmonary edema. CTA with cardiomegaly, concern for PNA, moderate R and small L pleural effusions, and negative for PE. BNP 2456.6. ECHO 9/25 showed LVEF 25-30% (last EF 20-25%), RWMA, G1DD, RV normal.    Current HF Medications: Diuretic: furosemide 20 mg PO daily Beta Blocker: metoprolol XL 12.5 mg BID ACE/ARB/ARNI: Entresto 24/26 mg BID MRA: spironolactone 12.5 mg daily  Prior to admission HF Medications: Beta blocker: metoprolol XL 50 mg BID MRA: spironolactone 25 mg daily *not taking lasix or Entresto due to hypotension  Pertinent Lab Values: Serum creatinine 0.97, BUN 17, Potassium 4.0, Sodium 137, BNP 2456.6, Magnesium 2.0  Vital Signs: Weight: 130 lbs (admission weight: 134 lbs) Blood pressure: 100-120/50s  Heart rate: 70-80s  I/O: net -4.8L since admission  Medication Assistance / Insurance Benefits Check: Does the patient have prescription insurance?  Yes Type of insurance plan: Monia Pouch Medicare  Does the patient qualify for medication assistance through manufacturers or grants? Yes Eligible grants and/or patient assistance programs: Entresto Medication assistance applications in progress: Entresto  Medication assistance applications approved: none Approved medication assistance renewals will be completed by: [facility]  Outpatient Pharmacy:  [facility] the patient willing to use [facility] TOC pharmacy at discharge? Yes Is the patient  willing to transition their outpatient pharmacy to utilize a [facility] outpatient pharmacy?   No    Assessment: 1. Acute on chronic systolic CHF (LVEF 20-25%), due to ICM. NYHA class II symptoms. - Off IV lasix. Volume improved. Continue furosemide 20 mg PO daily. Strict I/Os and daily weights. Keep K>4 and Mg>2. - Metoprolol XL reduced to 12.5 mg BID and spironolactone reduced to 12.5 mg daily with soft BP and higher fall risk. - Stopped Entresto 2/2 hypotension back in June with DBP 40s. Restarted 24/26 mg BID on 9/26. Monitor BP.  - Consider restarting Jardiance 10 mg daily. His wife said he was only on this for a short time before it was discontinued. It appears it was discontinued from med list 02/08/22. She does not recall any issues that he had that led to this being discontinued.    Plan: 1) Medication changes recommended at this time: - Start Jardiance 10 mg daily  2) Patient assistance: Sherryll Burger copay [phone] - previously received Entresto patient assistance as the copay for this + Xarelto too high. Confirmed with [employer] that is is still approved through the end of 2024. A new prescription for 24/26 mg was sent into the company. Patient's wife contacted the pharmacy to request shipment of this medication but initially there was question on what strength he needed. By the time she was able to call back on Friday, their pharmacy closed. Patient's wife said she will attempt to call back today.  - Farxiga/Jardiance copay $47  3)  Education  - Patient has been educated on current HF medications and potential additions to HF medication regimen - Patient verbalizes  understanding that over the next few months, these medication doses may change and more medications may be added to optimize HF regimen - Patient has been educated on basic disease state pathophysiology and goals of therapy   Sharen Hones, PharmD, BCPS Heart Failure Stewardship Pharmacist Phone [phone]

## 2022-11-04 NOTE — Plan of Care (Signed)

## 2022-11-04 NOTE — Assessment & Plan Note (Addendum)
U/O decreased after discontinuation of foley. Mild volume release with Lasix. Bladder scans overnight with volume <300 cc. Low suspicion for retention at this time.  - Continue Flomax 0.4mg  daily

## 2022-11-04 NOTE — Progress Notes (Signed)
Physical Therapy Treatment Patient Details Name: Benjamin Cook MRN: [id number] DOB: April 26, 1945 Today's Date: 11/04/2022   History of Present Illness 77 yo male presents to [facility] on 9/24 from home with SOB, workup for acute hypoxic respiratory failure secondary to HF exacerbation. Respiratory distress event 9/25 requiring bipap. Recent admission for covid +. PMH includes lung cancer dx 05/2022, AICD, anxiety, OA, CAD, CHF with EF 20-25%, COPD, HTN, HLD, PVD.    PT Comments  Pt up in chair upon PT arrival to room, states he wants to get back into bed but is agreeable to PT session. Pt ambulatory for short hallway distance, ~35 ft, with use of RW and close guard for safety. Pt on 4LO2 throughout session, with brief dip to 86% but recovers well >95% with pursed lip breathing and seated rest without increase in O2 needs. Pt tolerated repeated sit<>stands and seated exercise well. Pt with improving activity tolerance and strength, PT to continue to follow to progress pt.      If plan is discharge home, recommend the following: A little help with walking and/or transfers;A little help with bathing/dressing/bathroom   Can travel by private vehicle        Equipment Recommendations  None recommended by PT    Recommendations for Other Services       Precautions / Restrictions Precautions Precautions: Fall Precaution Comments: monitor O2, monitor BP Restrictions Weight Bearing Restrictions: No     Mobility  Bed Mobility Overal bed mobility: Needs Assistance Bed Mobility: Supine to Sit, Sit to Supine       Sit to supine: HOB elevated, Min assist   General bed mobility comments: up in chair upon PT arrival to room, LE lift assist back into bed    Transfers Overall transfer level: Needs assistance Equipment used: Rolling walker (2 wheels) Transfers: Sit to/from Stand Sit to Stand: Contact guard assist           General transfer comment: close guard for safety, cues for hand  placement when rising. stand x4 throughout session    Ambulation/Gait Ambulation/Gait assistance: Contact guard assist Gait Distance (Feet): 35 Feet Assistive device: Rolling walker (2 wheels) Gait Pattern/deviations: Step-through pattern, Decreased stride length, Trunk flexed Gait velocity: decr     General Gait Details: for safety, cues for RW proximity and upright posture. Additional cuing for pursed lip breathing technique when recovering. 4LO2   Stairs             Wheelchair Mobility     Tilt Bed    Modified Rankin (Stroke Patients Only)       Balance Overall balance assessment: Needs assistance Sitting-balance support: No upper extremity supported, Feet supported Sitting balance-Leahy Scale: Fair     Standing balance support: During functional activity, Bilateral upper extremity supported, Reliant on assistive device for balance Standing balance-Leahy Scale: Poor                              Cognition Arousal: Alert Behavior During Therapy: WFL for tasks assessed/performed Overall Cognitive Status: Within Functional Limits for tasks assessed                                 General Comments: pleasant, A&O        Exercises General Exercises - Lower Extremity Hip Flexion/Marching: AROM, Both, 10 reps, Seated Mini-Sqauts: AROM, Both, Seated, Standing (sit to stands from  EOB x3)    General Comments General comments (skin integrity, edema, etc.): 4LO2, SpO2 with brief dip to 86% but good recovery >95% with seated rest and breathing technique      Pertinent Vitals/Pain Pain Assessment Pain Assessment: Faces Faces Pain Scale: No hurt Pain Intervention(s): Monitored during session    Home Living                          Prior Function            PT Goals (current goals can now be found in the care plan section) Acute Rehab PT Goals Patient Stated Goal: home PT Goal Formulation: With patient/family Time For  Goal Achievement: 11/14/22 Potential to Achieve Goals: Good Progress towards PT goals: Progressing toward goals    Frequency    Min 1X/week      PT Plan      Co-evaluation              AM-PAC PT "6 Clicks" Mobility   Outcome Measure  Help needed turning from your back to your side while in a flat bed without using bedrails?: A Little Help needed moving from lying on your back to sitting on the side of a flat bed without using bedrails?: A Little Help needed moving to and from a bed to a chair (including a wheelchair)?: A Little Help needed standing up from a chair using your arms (e.g., wheelchair or bedside chair)?: A Little Help needed to walk in hospital room?: A Little Help needed climbing 3-5 steps with a railing? : A Lot 6 Click Score: 17    End of Session Equipment Utilized During Treatment: Oxygen Activity Tolerance: Patient tolerated treatment well Patient left: in bed;with call bell/phone within reach;with bed alarm set;with family/visitor present Nurse Communication: Mobility status PT Visit Diagnosis: Other abnormalities of gait and mobility (R26.89);Muscle weakness (generalized) (M62.81)     Time: 4132-4401 PT Time Calculation (min) (ACUTE ONLY): 26 min  Charges:    $Gait Training: 8-22 mins $Therapeutic Activity: 8-22 mins PT General Charges $$ ACUTE PT VISIT: 1 Visit                     Marye Round, PT DPT Acute Rehabilitation Services Secure Chat Preferred  Office [phone]    Chayah Mckee Sheliah Plane 11/04/2022, 4:41 PM

## 2022-11-04 NOTE — Assessment & Plan Note (Addendum)
Improved since yesterday, breathing comfortably on 8L HFNC. Improvement after initiation of steroids on 9/29. No focal adventitious lung sounds on exam. Etiology of respiratory status is multifactorial including worsening ability to compensate for underlying lung disease, chemoradiation, lung cancer, HFrEF, amiodarone therapy, COPD, and PNA.  - Continue to wean O2 as tolerated, doubt will be able to wean to RA - Continue Prednisone 40 mg x5day course (9/29-10/3) - Treatment of CHF as below - Treatment of PNA as below - Outpatient follow up with Oncology for further management of lung cancer - DME order for home O2

## 2022-11-05 ENCOUNTER — Encounter: Payer: Self-pay | Admitting: Hematology and Oncology

## 2022-11-05 ENCOUNTER — Other Ambulatory Visit (HOSPITAL_COMMUNITY): Payer: Self-pay

## 2022-11-05 DIAGNOSIS — G629 Polyneuropathy, unspecified: Secondary | ICD-10-CM

## 2022-11-05 DIAGNOSIS — Z7189 Other specified counseling: Secondary | ICD-10-CM | POA: Diagnosis not present

## 2022-11-05 DIAGNOSIS — Z515 Encounter for palliative care: Secondary | ICD-10-CM | POA: Diagnosis not present

## 2022-11-05 DIAGNOSIS — M792 Neuralgia and neuritis, unspecified: Secondary | ICD-10-CM

## 2022-11-05 LAB — CBC
HCT: 31.6 % — ABNORMAL LOW (ref 39.0–52.0)
Hemoglobin: 10.1 g/dL — ABNORMAL LOW (ref 13.0–17.0)
MCH: 30.6 pg (ref 26.0–34.0)
MCHC: 32 g/dL (ref 30.0–36.0)
MCV: 95.8 fL (ref 80.0–100.0)
Platelets: 247 10*3/uL (ref 150–400)
RBC: 3.3 MIL/uL — ABNORMAL LOW (ref 4.22–5.81)
RDW: 16.7 % — ABNORMAL HIGH (ref 11.5–15.5)
WBC: 7.2 10*3/uL (ref 4.0–10.5)
nRBC: 0 % (ref 0.0–0.2)

## 2022-11-05 LAB — BASIC METABOLIC PANEL
Anion gap: 6 (ref 5–15)
BUN: 22 mg/dL (ref 8–23)
CO2: 25 mmol/L (ref 22–32)
Calcium: 8.3 mg/dL — ABNORMAL LOW (ref 8.9–10.3)
Chloride: 108 mmol/L (ref 98–111)
Creatinine, Ser: 1.06 mg/dL (ref 0.61–1.24)
GFR, Estimated: >60 mL/min (ref >60)
Glucose, Bld: 122 mg/dL — ABNORMAL HIGH (ref 70–99)
Potassium: 3.6 mmol/L (ref 3.5–5.1)
Sodium: 139 mmol/L (ref 135–145)

## 2022-11-05 LAB — MAGNESIUM: Magnesium: 1.9 mg/dL (ref 1.7–2.4)

## 2022-11-05 MED ORDER — GABAPENTIN 100 MG PO CAPS
100.0000 mg | ORAL_CAPSULE | Freq: Every day | ORAL | 0 refills | Status: AC
  Filled 2022-11-05: qty 30, 30d supply, fill #0

## 2022-11-05 MED ORDER — PREDNISONE 20 MG PO TABS
ORAL_TABLET | ORAL | 0 refills | Status: AC
  Filled 2022-11-05: qty 15, 12d supply, fill #0

## 2022-11-05 MED ORDER — RIVAROXABAN 15 MG PO TABS
15.0000 mg | ORAL_TABLET | Freq: Every day | ORAL | Status: DC
  Administered 2022-11-05: 15 mg via ORAL
  Filled 2022-11-05: qty 1

## 2022-11-05 MED ORDER — SPIRONOLACTONE 25 MG PO TABS
12.5000 mg | ORAL_TABLET | Freq: Every day | ORAL | 0 refills | Status: DC
  Filled 2022-11-05: qty 15, 30d supply, fill #0

## 2022-11-05 MED ORDER — TAMSULOSIN HCL 0.4 MG PO CAPS
0.4000 mg | ORAL_CAPSULE | Freq: Every day | ORAL | 0 refills | Status: AC
  Filled 2022-11-05: qty 30, 30d supply, fill #0

## 2022-11-05 MED ORDER — METOPROLOL SUCCINATE ER 25 MG PO TB24
12.5000 mg | ORAL_TABLET | Freq: Two times a day (BID) | ORAL | 0 refills | Status: DC
  Filled 2022-11-05: qty 30, 30d supply, fill #0

## 2022-11-05 MED ORDER — RIVAROXABAN 15 MG PO TABS
15.0000 mg | ORAL_TABLET | Freq: Every day | ORAL | 0 refills | Status: DC
Start: 2022-11-05 — End: 2022-11-13
  Filled 2022-11-05: qty 30, 30d supply, fill #0

## 2022-11-05 MED ORDER — POTASSIUM CHLORIDE CRYS ER 20 MEQ PO TBCR
40.0000 meq | EXTENDED_RELEASE_TABLET | Freq: Once | ORAL | Status: AC
  Administered 2022-11-05: 40 meq via ORAL
  Filled 2022-11-05: qty 2

## 2022-11-05 MED ORDER — MAGNESIUM SULFATE 2 GM/50ML IV SOLN
2.0000 g | Freq: Once | INTRAVENOUS | Status: AC
  Administered 2022-11-05: 2 g via INTRAVENOUS
  Filled 2022-11-05: qty 50

## 2022-11-05 NOTE — Assessment & Plan Note (Signed)
U/O decreased after discontinuation of foley on 9/27. Mild volume release with Lasix. Bladder scans 9/30 volume < 300cc.  - Continue Flomax 0.4 mg daily

## 2022-11-05 NOTE — TOC Progression Note (Signed)
Transition of Care [facility]) - Progression Note    Patient Details  Name: Benjamin Cook MRN: [id number] Date of Birth: Jan 07, 1946  Transition of Care [facility]) CM/SW Contact  Leone Haven, RN Phone Number: 11/05/2022, 2:09 PM  Clinical Narrative:    NCM spoke with patient , offered choice, for oxygen, he has no preference.   NCM made referral to [facility] with [employer] for home oxygen.  A rep will bring a tank to patient's room and will set up a concentrator at patient's home.  He is also set up with [facility] for HHPT, HHOT.   Expected Discharge Plan: Home w Home Health Services Barriers to Discharge: Continued Medical Work up  Expected Discharge Plan and Services In-house Referral: NA Discharge Planning Services: CM Consult Post Acute Care Choice: Home Health Living arrangements for the past 2 months: Single Family Home                 DME Arranged: Oxygen DME Agency: [employer] Date DME Agency Contacted: 11/05/22 Time DME Agency Contacted: [id number] Representative spoke with at DME Agency: Vaughan Basta HH Arranged: PT, OT HH Agency: [facility] Date HH Agency Contacted: 11/01/22 Time HH Agency Contacted: [phone] Representative spoke with at [facility] Agency: Haywood Lasso   Social Determinants of Health (SDOH) Interventions SDOH Screenings   Food Insecurity: No Food Insecurity (10/29/2022)  Housing: Low Risk  (10/30/2022)  Recent Concern: Housing - Medium Risk (10/29/2022)  Transportation Needs: No Transportation Needs (10/30/2022)  Utilities: Not At Risk (10/29/2022)  Alcohol Screen: Low Risk  (10/30/2022)  Financial Resource Strain: Low Risk  (10/30/2022)  Social Connections: Unknown (06/19/2021)   Received from [facility], [facility]  Tobacco Use: High Risk (10/29/2022)    Readmission Risk Interventions     No data to display

## 2022-11-05 NOTE — Assessment & Plan Note (Addendum)
Euvolemic. Cr stable at 1.06 this AM. K of 3.6, Mg 1.9 this AM. BP soft this morning 103/45, patient is asymptomatic. As patient is at high risk of orthostasis/falls, will continue to de-escalate GDMT given soft blood pressures.  - 40 meQ K Oral - 2g IV Mg - Goal K > 4, Goal Mg > 2 - Continue home Lasix 20 mg daily - STOP Entresto 24-26 mg BID due to soft Bps - CONTINUE home Metoprolol Suc 12.5 mg BID - CONTINUE home Spironolactone 12.5 mg daily - Strict I/Os

## 2022-11-05 NOTE — Progress Notes (Addendum)
Heart Failure Stewardship Pharmacist Progress Note   PCP: [name], MD PCP-Cardiologist: Olga Millers, MD    HPI:  77 yo M with PMH of CHF, afib, COPD, CAD, HTN, and stage 3 NSCLC.  Cardiac history dates back to June of 2001. At that time the patient was found to have an abnormal EKG. Catheterization revealed a 95% mid LAD and a 70% diffuse right coronary artery and underwent PCI of LAD. His EF was 25-30%.   Presented to the ED on 9/24 with shortness of breath for 2 months. CXR with concern for pulmonary edema. CTA with cardiomegaly, concern for PNA, moderate R and small L pleural effusions, and negative for PE. BNP 2456.6. ECHO 9/25 showed LVEF 25-30% (last EF 20-25%), RWMA, G1DD, RV normal.    Current HF Medications: Diuretic: furosemide 20 mg PO daily Beta Blocker: metoprolol XL 12.5 mg BID MRA: spironolactone 12.5 mg daily  Prior to admission HF Medications: Beta blocker: metoprolol XL 50 mg BID MRA: spironolactone 25 mg daily *not taking lasix or Entresto due to hypotension  Pertinent Lab Values: Serum creatinine 1.06, BUN 22, Potassium 3.6, Sodium 139, BNP 2456.6, Magnesium 1.9  Vital Signs: Weight: 124 lbs (admission weight: 134 lbs) Blood pressure: 100/40-50s  Heart rate: 70s I/O: net -4.8L since admission  Medication Assistance / Insurance Benefits Check: Does the patient have prescription insurance?  Yes Type of insurance plan: Monia Pouch Medicare  Does the patient qualify for medication assistance through manufacturers or grants? Yes Eligible grants and/or patient assistance programs: Entresto Medication assistance applications in progress: none  Medication assistance applications approved: Entresto Approved medication assistance renewals will be completed by: [facility]  Outpatient Pharmacy:  [facility] the patient willing to use [facility] TOC pharmacy at discharge? Yes Is the patient willing to transition their outpatient  pharmacy to utilize a [facility] outpatient pharmacy?   No    Assessment: 1. Acute on chronic systolic CHF (LVEF 20-25%), due to ICM. NYHA class II symptoms. - Off IV lasix. Volume improved. Continue furosemide 20 mg PO daily. Strict I/Os and daily weights. Keep K>4 and Mg>2. - Continue metoprolol XL 12.5 mg BID - Holding Entresto with BP down to 100/40s today. Previously stopped Entresto 2/2 hypotension back in June with DBP 40s. Patient is a high fall risk.  - Continue spironolactone 12.5 mg daily - Consider restarting Jardiance 10 mg daily. His wife said he was only on this for a short time before it was discontinued. It appears it was discontinued from med list 02/08/22. She does not recall any issues that he had that led to this being discontinued. Foley was removed on 9/27. Noted intermittent hematuria and no dysuria.   Plan: 1) Medication changes recommended at this time: - Start Jardiance 10 mg daily  2) Patient assistance: Sherryll Burger copay [phone] - previously received Entresto patient assistance as the copay for this + Xarelto too high. Confirmed with [employer] that is is still approved through the end of 2024. A new prescription for 24/26 mg was sent into the company prior to the medication being held on 10/1. - Farxiga/Jardiance copay $47  3)  Education  - Patient has been educated on current HF medications and potential additions to HF medication regimen - Patient verbalizes understanding that over the next few months, these medication doses may change and more medications may be added to optimize HF regimen - Patient has been educated on basic disease state pathophysiology and goals of therapy  Sharen Hones, PharmD, BCPS Heart Failure Stewardship Pharmacist Phone [phone]

## 2022-11-05 NOTE — Assessment & Plan Note (Signed)
DNR/DNI.  - Palliative consult, appreciate recommendations  - Continue pain regimen and oxygen support as above - Outpatient follow up with Oncologist regarding further immunotherapy plans

## 2022-11-05 NOTE — Progress Notes (Addendum)
Daily Progress Note Intern Pager: [phone]  Patient name: Benjamin Cook Medical record number: [id number] Date of birth: 12-10-1945 Age: 77 y.o. Gender: male  Primary Care Provider: [name], MD Consultants: Oncology, Cardiology, Palliative  Code Status: DNR/DNI  Pt Overview and Major Events to Date:  9/24: Admitted on BiPAP 9/25: Weaned off BiPAP 9/27: Transitioned to DNR/DNI 9/29: Titrated up to NRB 9/30: Weaned to HFNC 10/1: Weaned to low flow nasal cannula   Assessment and Plan:  Benjamin Cook is a 77 y.o. admitted for acute hypoxic respiratory failure 2/2 PNA, and CHF exacerbation. Male with PMHx of Stage 3 NSCLC (currently on immunotherapy with Durvalumab), CHF (EF 20-25% per Echo 05/2022), Afib on Xarelto, COPD, CAD, and HTN here for acute hypoxic respiratory failure.  Assessment & Plan Acute hypoxic respiratory failure ([name]) Improved since yesterday, breathing comfortably on 3L low flow. - Continue to monitor O2 requirements, goal of home with <6L oxygen - Continue Prednisone 40 mg x 5 day course (9/29-10/3). Discharge on steroid taper - Walk test and orthostatics prior to discharge  - Treatment of CHF as below - Treatment of PNA as below - Outpatient follow up with Oncology for further management of lung cancer - DME order for home O2 Lung cancer ([name]) Stage III NSCLC -Per Palliative discussion, continue outpatient discussion with Oncology regarding immunotherapy continuation/discontinuation - Tylenol PRN for pain  Acute on chronic systolic heart failure ([name])  Euvolemic. Cr stable at 1.06 this AM. K of 3.6, Mg 1.9 this AM. BP soft this morning 103/45, patient is asymptomatic. As patient is at high risk of orthostasis/falls, will continue to de-escalate GDMT given soft blood pressures.  - 40 meQ K Oral - 2g IV Mg - Goal K > 4, Goal Mg > 2 - Continue home Lasix 20 mg daily - STOP Entresto 24-26 mg BID due to soft Bps - CONTINUE home Metoprolol  Suc 12.5 mg BID - CONTINUE home Spironolactone 12.5 mg daily - Strict I/Os Pneumonia Patient remains afebrile, feeling improved today as well. No leukocytosis on CBC. Antibiotic course completed.  - CTX course complete (9/24-9/28) - Azithromycin course complete (9/25-9/27) COPD, severe ([name]) No wheezing on exam today. Patient with no shortness of breath, change in cough/sputum production.  - Duonebs PRN - Continue home Dulera Paroxysmal atrial fibrillation ([name]) Currently controlled on home regimen of amiodarone 200 mg BID. Also on Xarelto 15 mg daily for anticoagulation. HR WNL overnight in the 70s-80s. Patient asymptomatic.  - Cardiology consulted, appreciate recs - Continue Amiodarone 200 mg BID - Continue Xarelto 15 mg daily - Cardiac monitoring Anemia Improving; asymptomatic. Suspect due to trauma from foley catheter placement. CBC with Hgb of 10.1, similar to baseline during admission (10-11).  - Continue to trend in outpatient setting  QT prolongation Qtc 558 9/26. - Caution with Qtc prolonging medications Urinary retention U/O decreased after discontinuation of foley on 9/27. Mild volume release with Lasix. Bladder scans 9/30 volume < 300cc.  - Continue Flomax 0.4 mg daily Neuropathy Patient with chronic neuropathic pain in hands. No focal weakness/numbness/tingling.  - Continue Gabapentin 100 mg nightly  Goals of care, counseling/discussion DNR/DNI.  - Palliative consult, appreciate recommendations  - Continue pain regimen and oxygen support as above - Outpatient follow up with Oncologist regarding further immunotherapy plans   Chronic and Stable Issues: Hyperlipidemia: Continue Atorvastatin 80 mg daily  Constipation: Continue Senna 2 tabs daily  GERD: Continue PO Protonix 40 mg daily  Insomnia: Continue Melatonin  5 mg nightly  FEN/GI: Heart Healthy Diet  PPx: Home Xarelto  Dispo: Home with home health 10/2  Subjective:  Patient is feeling improved this morning,  breathing comfortably on 3L nasal cannula at this time. Denies any shortness of breath, chest pain, palpitations. No nausea, vomiting, abdominal pain, leg pain/swelling. No HA/weakness. Has been able to walk to the door and back with PT. Reports that hematuria has lightened and is now intermittent, no dysuria.   Objective: Temp:  [97.5 F (36.4 C)-98 F (36.7 C)] 97.7 F (36.5 C) (10/01 0735) Pulse Rate:  [73-82] 76 (10/01 0735) Resp:  [18-25] 24 (10/01 0735) BP: (95-121)/(44-53) 103/45 (10/01 0735) SpO2:  [96 %-100 %] 96 % (10/01 0735) Weight:  [56.6 kg] 56.6 kg (10/01 0429) Physical Exam: General: NAD, non-toxic appearing, alert and oriented.  Cardiovascular: HRRR. No murmurs, rubs, gallops.  Respiratory: No increased work of breathing, no conversational dyspnea or upper accessory muscle usage. Comfortable on 3L low flow, lungs with no focal wheezing, rales, rhonchi.  Abdomen: Soft, non-tender, non-distended, no rebound or guarding.  Extremities: Warm and well-perfused with no LE edema.   Laboratory: Most recent CBC Lab Results  Component Value Date   WBC 7.2 11/05/2022   HGB 10.1 (L) 11/05/2022   HCT 31.6 (L) 11/05/2022   MCV 95.8 11/05/2022   PLT 247 11/05/2022   Most recent BMP    Latest Ref Rng & Units 11/05/2022    6:17 AM  BMP  Glucose 70 - 99 mg/dL 272   BUN 8 - 23 mg/dL 22   Creatinine 5.36 - 1.24 mg/dL 6.44   Sodium 034 - 742 mmol/L 139   Potassium 3.5 - 5.1 mmol/L 3.6   Chloride 98 - 111 mmol/L 108   CO2 22 - 32 mmol/L 25   Calcium 8.9 - 10.3 mg/dL 8.3     Other pertinent labs none    Imaging/Diagnostic Tests: CXR 9/29: Bilateral middle lung field opacities, improved haziness from previous XR. Residual fibrosis or infiltration in the mid lungs, possible post treatment changes in the R hilum.   [place], Medical Student 11/05/2022, 8:16 AM  PGY-, [facility] FPTS Intern pager: [phone], text pages welcome Secure chat group [facility] Teaching Service   I have reviewed and concur with this student's documentation.   Darral Dash, DO 11/05/2022 2:57 PM

## 2022-11-05 NOTE — Assessment & Plan Note (Addendum)
Improved since yesterday, breathing comfortably on 3L low flow. - Continue to monitor O2 requirements, goal of home with <6L oxygen - Continue Prednisone 40 mg x 5 day course (9/29-10/3). Discharge on steroid taper - Walk test and orthostatics prior to discharge  - Treatment of CHF as below - Treatment of PNA as below - Outpatient follow up with Oncology for further management of lung cancer - DME order for home O2

## 2022-11-05 NOTE — Assessment & Plan Note (Signed)
No wheezing on exam today. Patient with no shortness of breath, change in cough/sputum production.  - Duonebs PRN - Continue home [facility]

## 2022-11-05 NOTE — Assessment & Plan Note (Addendum)
Qtc 558 9/26. - Caution with Qtc prolonging medications

## 2022-11-05 NOTE — Assessment & Plan Note (Signed)
Patient with chronic neuropathic pain in hands. No focal weakness/numbness/tingling.  - Continue Gabapentin 100 mg nightly

## 2022-11-05 NOTE — Assessment & Plan Note (Signed)
Currently controlled on home regimen of amiodarone 200 mg BID. Also on Xarelto 15 mg daily for anticoagulation. HR WNL overnight in the 70s-80s. Patient asymptomatic.  - Cardiology consulted, appreciate recs - Continue Amiodarone 200 mg BID - Continue Xarelto 15 mg daily - Cardiac monitoring

## 2022-11-05 NOTE — Assessment & Plan Note (Signed)
Patient remains afebrile, feeling improved today as well. No leukocytosis on CBC. Antibiotic course completed.  - CTX course complete (9/24-9/28) - Azithromycin course complete (9/25-9/27)

## 2022-11-05 NOTE — Assessment & Plan Note (Addendum)
Improving; asymptomatic. Suspect due to trauma from foley catheter placement. CBC with Hgb of 10.1, similar to baseline during admission (10-11).  - Continue to trend in outpatient setting

## 2022-11-05 NOTE — Discharge Summary (Shared)
Family Medicine Teaching [facility] Discharge Summary  Patient name: Benjamin Cook Medical record number: [id number] Date of birth: January 23, 1946 Age: 77 y.o. Gender: male Date of Admission: 10/29/2022  Date of Discharge: 11/06/2022 Admitting Physician: Lincoln Brigham, MD  Primary Care Provider: [name], MD Consultants: Oncology, Cardiology, Palliative Care  Indication for Hospitalization: Acute Hypoxic Respiratory Failure  Brief Hospital Course:  Benjamin Cook is a 77 y.o.male with a history of Stage 3 NSCLC (currently on immunotherapy with Durvalumab), CHF (EF 20-25% per Echo 05/2022), Afib on Xarelto, COPD, CAD, and HTN who was admitted to the [facility] Ed Medicine Teaching Service at [facility] for acute hypoxic respiratory failure. His hospital course is detailed below:  Acute Hypoxic Respiratory Failure Patient presented with acute SOB and hypoxic to 80s, placed on BiPAP in ED initially. CXR upon admission no PNA and EKG with no ischemia. Patient had difficulty maintaining his oxygen saturation even after trialing numerous noninvasive ventilation techniques.  Attempted steroid trial with Prednisone 40 mg x 5 days (9/29-10/3).  Was difficult to wean but upon discharge patient maintained stable O2 saturation on 3L low flow nasal cannula.  Etiology of respiratory status is multifactorial including worsening ability to compensate for underlying lung disease, chemoradiation, lung cancer, HFrEF, amiodarone therapy, COPD, and PNA. Diuresed with multiple doses of IV Lasix and transitioned to home PO Lasix to help stabilize his CHF exacerbation as well. Per PT/OT recommendations, patient to home with home health and supplemental oxygen.   Pneumonia Patient with subjective fevers and cough at home, afebrile on presentation. Initial CXR on admission with no evidence of PNA. RPP and COVID negative. Patient became febrile and repeat CXR demonstrated PNA. Blood cultures demontrated no growth at  discharge. Patient received course of IV Abx including Azithromycin (9/25-9/27), and Rocephin (9/24-9/28). Afebrile, with no wheezing or productive cough on discharge.   Acute CHF Exacerbation GDMT for HF discontinued on 07/2022 (including Entresto, home Lasix 20 mg, and Aldactone) 2/2 hypotension. BNP elevated to 2.4K. CXR with pulmonary edema on presentation, but appears dry on exam with cold extremities. Echo with LVEF 20-25%, LV regional wall abnormalities, and mild aortic calcification. As mentioned above, had difficulty weaning patient's oxygen requirement during admission even with adequate response to IV Lasix. Cardiology recommended restarting patient's home Entresto, Metoprolol, and Aldactone during admission but due to risks of falls w/ soft BPs versus benefits, GDMT was de-escalated during admission. Metoprolol reduced to 12.5 mg BID, Aldactone reduced to 12.5 mg daily, and Entresto discontinued 10/1. Was diuresed with multiple doses of IV Lasix and transitioned to home PO Lasix 20 mg daily. Euvolemic on exam, with adequate output. Recommend follow up with Cardiology for further management of GDMT for HF.   Urinary Retention Foley placement 9/24 due to urinary retention with good output, discontinued on 9/27, with successful void trail. Bladder scans since removal with < 300 cc PVR. Continue home Flomax 0.4 mg daily.   Anemia Hgb 13.9 on admission, downtrended to 11.3. Some blood noted in foley catheter, which was subsequently removed. Intermittent hematuria on persisted likely due to trauma from foley catheter placement. CBC stable at 10.1 on discharge. Patient asymptomatic with no active bleeding. Recommend outpatient CBC to trend Hgb.   Lung Cancer  GOC Patient with diagnosis of Stage III NSCLC diagnosed on 06/03/2022 (followed by Dr. Bertis Ruddy), with 4.1 cm R hilar lesion with bilateral bronchial thickening. No evidence of metastatic disease per PET in 06/2022. Currently on chemotherapy with  Durvalumab q28d, last dose  on 10/01/2022. Consulted Oncology, less concern for pneumonitis related to immunotherapy, they recommend outpatient immunotherapy, no further workup/recommendations for inpatient stay. Patient transitioned to DNR/DNI on 9/27. Palliative consulted to discuss his goals further and patient would like to continue being treated for his cardio/pulmonary problems, however may consider talking to oncology outpatient regarding discontinuing chemotherapy. Recommend follow up with Oncology outpatient for further discussion of cancer treatment.  Other chronic conditions were medically managed with home medications and formulary alternatives as necessary: COPD: Stable, no increase in cough or sputum production. Continued home Dulera and albuterol inhaler. Atrial Fibrillation: stable and in sinus rhythm upon admission. Continued home medications including amiodarone and Xarelto. Xarelto decreased from 20 mg daily to 15 mg daily given her CrCl.  Neuropathy: improved during admission with addition of Gabapentin 100 mg nightly, continued on discharge.  Hyperlipidemia: stable, continue atorvastatin 80 mg daily   PCP Follow-up Recommendations: Recommend follow up with Cardiology for further management of GDMT for HF Recommend follow up with Oncology for further management of lung cancer Recommend outpatient CBC to continue to trend Hgb and for observation of hematuria.   Discharge Diagnoses/Problem List:  Acute Hypoxic Respiratory Failure Lung Cancer COPD Paroxysmal Atrial Fibrillation Neuropathy Anemia Pneumonia Urinary Retention  Disposition: Home with home health, physical therapy, and supplemental oxygen   Discharge Condition: Stable  Discharge Exam:  Physical Exam Vitals reviewed.  HENT:     Head: Normocephalic and atraumatic.     Mouth/Throat:     Pharynx: Oropharynx is clear.  Eyes:     Pupils: Pupils are equal, round, and reactive to light.  Cardiovascular:      Rate and Rhythm: Normal rate and regular rhythm.  Pulmonary:     Effort: Pulmonary effort is normal. No tachypnea or respiratory distress.     Comments: Breathing comfortably on 3L low flow.  Abdominal:     Palpations: Abdomen is soft.     Tenderness: There is no abdominal tenderness.  Musculoskeletal:     Cervical back: Normal range of motion and neck supple.     Right lower leg: No edema.     Left lower leg: No edema.  Skin:    General: Skin is warm and dry.     Capillary Refill: Capillary refill takes less than 2 seconds.  Neurological:     General: No focal deficit present.     Mental Status: He is alert and oriented to person, place, and time.    Issues for Follow Up:  Recommend follow up with Cardiology for further management of GDMT for HF Recommend follow up with Oncology for further management of lung cancer Recommend outpatient CBC to continue to trend Hgb and for observation of hematuria.   Significant Procedures: none  Significant Labs and Imaging:  Recent Labs  Lab 11/04/22 0504 11/05/22 0617  WBC 6.5 7.2  HGB 11.0* 10.1*  HCT 33.5* 31.6*  PLT 236 247   Recent Labs  Lab 11/04/22 0504 11/05/22 0617  NA 137 139  K 4.0 3.6  CL 103 108  CO2 24 25  GLUCOSE 134* 122*  BUN 17 22  CREATININE 0.97 1.06  CALCIUM 8.5* 8.3*  MG  --  1.9    Results/Tests Pending at Time of Discharge: none  Discharge Medications:  Allergies as of 11/05/2022       Reactions   Paclitaxel Other (See Comments)   Had chest pain after completion of chemotherapy on 08/12/2022     Med Rec must be completed prior to  using this SMARTLINK***        Durable Medical Equipment  (From admission, onward)           Start     Ordered   11/01/22 1130  For home use only DME oxygen  Once       Question Answer Comment  Length of Need 6 Months   Liters per Minute 6   Frequency Continuous (stationary and portable oxygen unit needed)   Oxygen delivery system Gas      11/01/22  1130            Discharge Instructions: Please refer to Patient Instructions section of EMR for full details.  Patient was counseled important signs and symptoms that should prompt return to medical care, changes in medications, dietary instructions, activity restrictions, and follow up appointments.   Follow-Up Appointments:  Follow-up Information     [facility]. Go in 12 day(s).   Specialty: Cardiology Why: Hospital follow up 11/13/2022 @ 10 am PLEASE bring a current medication list to appointment FREE valet parking, Entrance C, off [employer] information: [address] [place] [address] [phone]        Jobe Gibbon, [facility] Follow up.   Specialty: Home Health Services Why: Agency will call you to set up apt times Contact information: [address] [place] [address] [phone]         Azalee Course, [place] Follow up.   Specialties: Cardiology, Radiology Why: [facility] location - general cardiology follow-up on Monday Nov 25, 2022 at 10:55 AM. Arrive 15 minutes prior to appointment to check in. Wynema Birch is one of our PAs that works with Dr. [name]. Contact information: [address] [place] [address] [phone]                 Susy [place], Medical Student 11/05/2022, 11:34 AM  PGY-***, [facility]

## 2022-11-05 NOTE — Assessment & Plan Note (Addendum)
Stage III NSCLC -Per Palliative discussion, continue outpatient discussion with Oncology regarding immunotherapy continuation/discontinuation - Tylenol PRN for pain

## 2022-11-05 NOTE — Progress Notes (Signed)
   Palliative Medicine Inpatient Follow Up Note   HPI:  77 y.o. male  with past medical history of Stage 3 NSCLC (currently on immunotherapy with Durvalumab), CHF (EF 20-25% per Echo 05/2022), Afib on Xarelto, COPD, CAD, and HTN admitted on 10/29/2022 with worsening shortness of breath.   He is being treated for acute hypoxic respiratory failure due to pneumonia and CHF exacerbation.  Palliative care involved to discuss ongoing goals of care and symptom management.   Today's Discussion 11/05/2022  *Please note that this is a verbal dictation therefore any spelling or grammatical errors are due to the "Dragon Medical One" system interpretation.  Chart reviewed inclusive of vital signs, progress notes, laboratory results, and diagnostic images. O2 decreased to 3LPM [place].   I met with Arlyn and his spouse, Althene this morning.  Created space and opportunity for patient to explore thoughts feelings and fears regarding current medical situation.  Lyonel shares good understanding of his disease process.   From a symptom perspective Matisse expresses improvement in the nerve pain he has in his hands. We discussed a plan should it worsen in regards to medication titration. We reviewed the plan for follow up with [facility] at the cancer center.   Aleksander plans to speak to his oncologist about whether or not to continue immunotherapy.  Questions and concerns addressed/Palliative Support Provided.   Objective Assessment: Vital Signs Vitals:   11/05/22 0429 11/05/22 0735  BP: (!) 99/49 (!) 103/45  Pulse: 73 76  Resp: 18 (!) 24  Temp: 97.7 F (36.5 C) 97.7 F (36.5 C)  SpO2: 100% 96%   No intake or output data in the 24 hours ending 11/05/22 0956 Last Weight  Most recent update: 11/05/2022  4:30 AM    Weight  56.6 kg (124 lb 12.5 oz)            Gen:  Frail elderly AA M in NAD HEENT: moist mucous membranes CV: Regular rate and rhythm  PULM: On 3LPM , breathing is even and  nonlabored ABD: soft/nontender  EXT: No edema Neuro: Alert and oriented x3  SUMMARY OF RECOMMENDATIONS   DNR/DNI  Treat the treatable  Patient is considering stopping his immunotherapy but wants to discuss with his oncologist  Follow up with [facility] PMT at [facility]  Continued PMT support  Symptoms Management: Hypoxic Respiratory Failure: - Continue supplemental O2 - Additional PNA/COPD Management per primary  Neuropathy: - Continue Gabapentin 100mg  PO at bedtime  Billing based on MDM: Moderate  ______________________________________________________________________________________ Lamarr Lulas [place] Palliative Medicine Team Team Cell Phone: [phone] Please utilize secure chat with additional questions, if there is no response within 30 minutes please call the above phone number  Palliative Medicine Team providers are available by phone from 7am to 7pm daily and can be reached through the team cell phone.  Should this patient require assistance outside of these hours, please call the patient's attending physician.

## 2022-11-06 ENCOUNTER — Encounter: Payer: Self-pay | Admitting: Hematology and Oncology

## 2022-11-06 ENCOUNTER — Other Ambulatory Visit (HOSPITAL_COMMUNITY): Payer: Self-pay

## 2022-11-06 DIAGNOSIS — J9601 Acute respiratory failure with hypoxia: Secondary | ICD-10-CM | POA: Diagnosis not present

## 2022-11-06 DIAGNOSIS — I5023 Acute on chronic systolic (congestive) heart failure: Secondary | ICD-10-CM | POA: Diagnosis not present

## 2022-11-06 DIAGNOSIS — Z7189 Other specified counseling: Secondary | ICD-10-CM | POA: Diagnosis not present

## 2022-11-06 DIAGNOSIS — C3491 Malignant neoplasm of unspecified part of right bronchus or lung: Secondary | ICD-10-CM | POA: Diagnosis not present

## 2022-11-06 DIAGNOSIS — Z515 Encounter for palliative care: Secondary | ICD-10-CM | POA: Diagnosis not present

## 2022-11-06 DIAGNOSIS — G629 Polyneuropathy, unspecified: Secondary | ICD-10-CM | POA: Diagnosis not present

## 2022-11-06 MED ORDER — POTASSIUM CHLORIDE CRYS ER 20 MEQ PO TBCR
40.0000 meq | EXTENDED_RELEASE_TABLET | Freq: Once | ORAL | Status: AC
  Administered 2022-11-06: 40 meq via ORAL
  Filled 2022-11-06: qty 4

## 2022-11-06 MED ORDER — MAGNESIUM SULFATE 2 GM/50ML IV SOLN
2.0000 g | Freq: Once | INTRAVENOUS | Status: AC
  Administered 2022-11-06: 2 g via INTRAVENOUS
  Filled 2022-11-06: qty 50

## 2022-11-06 MED ORDER — EMPAGLIFLOZIN 10 MG PO TABS
10.0000 mg | ORAL_TABLET | Freq: Every day | ORAL | 0 refills | Status: DC
Start: 2022-11-06 — End: 2022-11-13
  Filled 2022-11-06: qty 30, 30d supply, fill #0

## 2022-11-06 MED ORDER — IPRATROPIUM-ALBUTEROL 0.5-2.5 (3) MG/3ML IN SOLN: 3.0000 mL | Freq: Two times a day (BID) | RESPIRATORY_TRACT | Status: DC

## 2022-11-06 MED ORDER — HEPARIN SOD (PORK) LOCK FLUSH 100 UNIT/ML IV SOLN
500.0000 [IU] | INTRAVENOUS | Status: AC | PRN
  Administered 2022-11-06: 500 [IU]
  Filled 2022-11-06: qty 5

## 2022-11-06 NOTE — Progress Notes (Signed)
Occupational Therapy Treatment Patient Details Name: Benjamin Cook MRN: [id number] DOB: 1945/04/16 Today's Date: 11/06/2022   History of present illness 77 yo male [facility] to [facility] 9/[facility] from home with SOB, workup for acute hypoxic respiratory failure secondary to HF exacerbation. Respiratory distress event 9/25 requiring bipap. Recent admission for covid +. PMH includes lung cancer dx 05/2022, AICD, anxiety, OA, CAD, CHF with EF 20-25%, COPD, HTN, HLD, PVD.   OT comments  OT educated pt in therapeutic B UE exercises to increase strength and activity tolerance and techniques for increased safety and independence with functional tasks, including training in energy conservation strategies and use of gait belt when ambulating with family in the home. Pt largely performing ADLs Independent to Contact guard assist and functional mobility with a RW with Contact guard assist with occasional cues for safety and technique and increased time. Pt participated well in session and is making progress toward goals. Pt is scheduled to discharge home today with continued skilled OT services in the home and support of family. If pt does not discharge from acute setting as planned, pt will benefit from continued acute skilled OT services to address deficits outlined below and increase safety and independence with functional tasks.       If plan is discharge home, recommend the following:  A little help with walking and/or transfers;A little help with bathing/dressing/bathroom;Assistance with cooking/housework;Assist for transportation;Help with stairs or ramp for entrance   Equipment Recommendations  Other (comment);None recommended by OT (OT provided gait belt this session. No additional equipment needs identified at this time.)    Recommendations for Other Services      Precautions / Restrictions Precautions Precautions: Fall Restrictions Weight Bearing Restrictions: No       Mobility Bed  Mobility Overal bed mobility: Needs Assistance Bed Mobility: Supine to Sit, Sit to Supine     Supine to sit: Supervision Sit to supine: Supervision        Transfers Overall transfer level: Needs assistance Equipment used: Rolling walker (2 wheels) Transfers: Sit to/from Stand, Bed to chair/wheelchair/BSC Sit to Stand: Contact guard assist     Step pivot transfers: Contact guard assist     General transfer comment: Verbal and visual cues for safety and hand placement /technique     Balance Overall balance assessment: Needs assistance Sitting-balance support: No upper extremity supported, Feet supported Sitting balance-Leahy Scale: Good     Standing balance support: Bilateral upper extremity supported, During functional activity, Reliant on assistive device for balance Standing balance-Leahy Scale: Poor Standing balance comment: UE support and CGA for static standing                           ADL either performed or assessed with clinical judgement   ADL Overall ADL's : Needs assistance/impaired Eating/Feeding: Independent;Sitting   Grooming: Contact guard assist;Standing           Upper Body Dressing : Set up;Sitting Upper Body Dressing Details (indicate cue type and reason): assistance for line management only Lower Body Dressing: Contact guard assist;Sit to/from stand   Toilet Transfer: Contact guard assist;Ambulation;Regular Toilet;Grab bars;Rolling walker (2 wheels);Cueing for safety   Toileting- Clothing Manipulation and Hygiene: Contact guard assist;Sit to/from stand       Functional mobility during ADLs: Contact guard assist;Rolling walker (2 wheels);Cueing for safety General ADL Comments: Pt continues to present with decreased activity tolerance and fatigues quickly. OT re-educated pt in energy conservation techniques during functional tasks  with pt verbalizing understanding.    Extremity/Trunk Assessment Upper Extremity Assessment Upper  Extremity Assessment: Right hand dominant;Generalized weakness   Lower Extremity Assessment Lower Extremity Assessment: Defer to PT evaluation        Vision       Perception     Praxis      Cognition Arousal: Alert Behavior During Therapy: WFL for tasks assessed/performed Overall Cognitive Status: Within Functional Limits for tasks assessed                                          Exercises Exercises: General Upper Extremity General Exercises - Upper Extremity Shoulder Flexion: AROM, Both, 10 reps, Strengthening, Seated (sitting EOB; increased activity tolerance) Shoulder Extension: AROM, Both, Strengthening, 10 reps, Seated (sitting EOB; increased activity tolerance) Shoulder ABduction: AROM, Both, Strengthening, 10 reps, Seated (sitting EOB; increased activity tolerance) Shoulder ADduction: AROM, Both, Strengthening, 10 reps, Seated (sitting EOB; increased activity tolerance) Shoulder Horizontal ABduction: AROM, Both, Strengthening, 10 reps, Seated (sitting EOB; increased activity tolerance) Shoulder Horizontal ADduction: AROM, Both, Strengthening, 10 reps, Seated (sitting EOB; increased activity tolerance) Elbow Flexion: AROM, Both, Strengthening, 10 reps, Seated (sitting EOB; increased activity tolerance) Elbow Extension: AROM, Both, Strengthening, 10 reps, Seated (sitting EOB; increased activity tolerance)    Shoulder Instructions       General Comments VSS on 3L continuous O2 through nasal cannula throughout session. Pt's wife present during session. OT provided pt with a gait belt and trained pt and wife in use with both verbalizing understanding.    Pertinent Vitals/ Pain       Pain Assessment Pain Assessment: No/denies pain  Home Living                                          Prior Functioning/Environment              Frequency  Min 1X/week        Progress Toward Goals  OT Goals(current goals can now be found  in the care plan section)  Progress towards OT goals: Progressing toward goals  Acute Rehab OT Goals Patient Stated Goal: to return home  Plan      Co-evaluation                 AM-PAC OT "6 Clicks" Daily Activity     Outcome Measure   Help from another person eating meals?: None Help from another person taking care of personal grooming?: A Little Help from another person toileting, which includes using toliet, bedpan, or urinal?: A Little Help from another person bathing (including washing, rinsing, drying)?: A Little Help from another person to put on and taking off regular upper body clothing?: A Little Help from another person to put on and taking off regular lower body clothing?: A Little 6 Click Score: 19    End of Session Equipment Utilized During Treatment: Gait belt;Rolling walker (2 wheels);Oxygen  OT Visit Diagnosis: Other abnormalities of gait and mobility (R26.89);Muscle weakness (generalized) (M62.81);Other (comment) (decreased activity tolerance)   Activity Tolerance Patient tolerated treatment well;Patient limited by fatigue   Patient Left in bed;with call bell/phone within reach;with bed alarm set;with family/visitor present   Nurse Communication Mobility status        Time: 7829-5621 OT Time Calculation (min): 23 min  Charges: OT General Charges $OT Visit: 1 Visit OT Treatments $Self Care/Home Management : 23-37 mins  Rexford Prevo "Orson Eva., OTR/L, MA Acute Rehab [phone]   Lendon Colonel 11/06/2022, 1:44 PM

## 2022-11-06 NOTE — Progress Notes (Signed)
Physical Therapy Treatment Patient Details Name: Benjamin Cook MRN: [id number] DOB: 06/21/1945 Today's Date: 11/06/2022   History of Present Illness 77 yo male presents to [facility] on 9/24 from home with SOB, workup for acute hypoxic respiratory failure secondary to HF exacerbation. Respiratory distress event 9/25 requiring bipap. Recent admission for covid +. PMH includes lung cancer dx 05/2022, AICD, anxiety, OA, CAD, CHF with EF 20-25%, COPD, HTN, HLD, PVD.    PT Comments  Pt making steady progress. Did very well with rollator. Instructed pt and wife on use of rollator including brakes and how to perform sit to stand to sit. Pt eager to return home today with [facility] services.     If plan is discharge home, recommend the following: A little help with walking and/or transfers;A little help with bathing/dressing/bathroom;Assistance with cooking/housework   Can travel by Pension scheme manager (4 wheels) (delivered)    Recommendations for Other Services       Precautions / Restrictions Precautions Precautions: Fall Restrictions Weight Bearing Restrictions: No     Mobility  Bed Mobility Overal bed mobility: Needs Assistance Bed Mobility: Supine to Sit, Sit to Supine     Supine to sit: Supervision Sit to supine: Supervision        Transfers Overall transfer level: Needs assistance Equipment used: Rollator (4 wheels) Transfers: Sit to/from Stand Sit to Stand: Contact guard assist           General transfer comment: Assist for safety and verbal cues for hand placement    Ambulation/Gait Ambulation/Gait assistance: Contact guard assist Gait Distance (Feet): 25 Feet Assistive device: Rollator (4 wheels) Gait Pattern/deviations: Step-through pattern, Decreased stride length, Trunk flexed Gait velocity: decr Gait velocity interpretation: <1.31 ft/sec, indicative of household ambulator   General Gait Details: Assist for safety. Verbal  cues for initial use of rollator   Stairs             Wheelchair Mobility     Tilt Bed    Modified Rankin (Stroke Patients Only)       Balance Overall balance assessment: Needs assistance Sitting-balance support: No upper extremity supported, Feet supported Sitting balance-Leahy Scale: Good     Standing balance support: Bilateral upper extremity supported, Single extremity supported Standing balance-Leahy Scale: Poor Standing balance comment: UE support and CGA for static standing                            Cognition Arousal: Alert Behavior During Therapy: WFL for tasks assessed/performed Overall Cognitive Status: Within Functional Limits for tasks assessed                                          Exercises      General Comments General comments (skin integrity, edema, etc.): VSS on 3L      Pertinent Vitals/Pain Pain Assessment Pain Assessment: No/denies pain    Home Living                          Prior Function            PT Goals (current goals can now be found in the care plan section) Progress towards PT goals: Progressing toward goals    Frequency    Min 1X/week  PT Plan      Co-evaluation              AM-PAC PT "6 Clicks" Mobility   Outcome Measure  Help needed turning from your back to your side while in a flat bed without using bedrails?: None Help needed moving from lying on your back to sitting on the side of a flat bed without using bedrails?: A Little Help needed moving to and from a bed to a chair (including a wheelchair)?: A Little Help needed standing up from a chair using your arms (e.g., wheelchair or bedside chair)?: A Little Help needed to walk in hospital room?: A Little Help needed climbing 3-5 steps with a railing? : A Lot 6 Click Score: 18    End of Session Equipment Utilized During Treatment: Oxygen Activity Tolerance: Patient tolerated treatment well Patient  left: in bed;with call bell/phone within reach;with bed alarm set;with family/visitor present   PT Visit Diagnosis: Other abnormalities of gait and mobility (R26.89);Muscle weakness (generalized) (M62.81)     Time: 1220-1250 PT Time Calculation (min) (ACUTE ONLY): 30 min  Charges:    $Gait Training: 23-37 mins PT General Charges $$ ACUTE PT VISIT: 1 Visit                     [facility] PT Acute Rehabilitation Services Office [phone]    Angelina Ok [facility] 11/06/2022, 1:20 PM

## 2022-11-06 NOTE — Progress Notes (Signed)
Heart Failure Stewardship Pharmacist Progress Note   PCP: [name], MD PCP-Cardiologist: Olga Millers, MD    HPI:  77 yo M with PMH of CHF, afib, COPD, CAD, HTN, and stage 3 NSCLC.  Cardiac history dates back to June of 2001. At that time the patient was found to have an abnormal EKG. Catheterization revealed a 95% mid LAD and a 70% diffuse right coronary artery and underwent PCI of LAD. His EF was 25-30%.   Presented to the ED on 9/24 with shortness of breath for 2 months. CXR with concern for pulmonary edema. CTA with cardiomegaly, concern for PNA, moderate R and small L pleural effusions, and negative for PE. BNP 2456.6. ECHO 9/25 showed LVEF 25-30% (last EF 20-25%), RWMA, G1DD, RV normal.    Discharge HF Medications: Diuretic: furosemide 20 mg PO daily Beta Blocker: metoprolol XL 12.5 mg BID MRA: spironolactone 12.5 mg daily SGLT2i: Jardiance 10 mg daily  Prior to admission HF Medications: Beta blocker: metoprolol XL 50 mg BID MRA: spironolactone 25 mg daily *not taking lasix or Entresto due to hypotension  Pertinent Lab Values: As of 10/1: Serum creatinine 1.06, BUN 22, Potassium 3.6, Sodium 139, BNP 2456.6, Magnesium 1.9  Vital Signs: Weight: 123 lbs (admission weight: 134 lbs) Blood pressure: 100/50s  Heart rate: 70s I/O: net -4.8L since admission  Medication Assistance / Insurance Benefits Check: Does the patient have prescription insurance?  Yes Type of insurance plan: Monia Pouch Medicare  Does the patient qualify for medication assistance through manufacturers or grants? Yes Eligible grants and/or patient assistance programs: Valla Leaver Medication assistance applications in progress: Jardiance  Medication assistance applications approved: Entresto Approved medication assistance renewals will be completed by: [facility]  Outpatient Pharmacy:  Prior to admission [facility] Is the patient willing to use [facility] TOC pharmacy at discharge?  Yes Is the patient willing to transition their outpatient pharmacy to utilize a [facility] outpatient pharmacy?   No    Assessment: 1. Acute on chronic systolic CHF (LVEF 20-25%), due to ICM. NYHA class II symptoms. - Off IV lasix. Volume improved. Continue furosemide 20 mg PO daily. Strict I/Os and daily weights. Keep K>4 and Mg>2. - Continue metoprolol XL 12.5 mg BID - Holding Entresto with BP down to 100/40s on 10/1. Previously stopped Entresto 2/2 hypotension back in June with DBP 40s. Patient is a high fall risk.  - Continue spironolactone 12.5 mg daily - Agree with restarting Jardiance 10 mg daily at discharge. His wife said he was only on this for a short time before it was discontinued. It appears it was discontinued from med list 02/08/22. She does not recall any issues that he had that led to this being discontinued. Foley was removed on 9/27. Noted intermittent hematuria and no dysuria.   Plan: 1) Medication changes recommended at this time: - Start Jardiance 10 mg daily  2) Patient assistance: Sherryll Burger copay [phone] - previously received Entresto patient assistance as the copay for this + Xarelto too high. Confirmed with [employer] that is is still approved through the end of 2024. A new prescription for 24/26 mg was sent into the company prior to the medication being held on 10/1. - Farxiga/Jardiance copay $47 - started patient assistance application for Jardiance  3)  Education  - Patient has been educated on current HF medications and potential additions to HF medication regimen - Patient verbalizes understanding that over the next few months, these medication doses may change and more medications may  be added to optimize HF regimen - Patient has been educated on basic disease state pathophysiology and goals of therapy   Benjamin Cook, PharmD, BCPS Heart Failure Stewardship Pharmacist Phone [phone]

## 2022-11-06 NOTE — Plan of Care (Signed)

## 2022-11-06 NOTE — Progress Notes (Signed)
   Palliative Medicine Inpatient Follow Up Note   HPI:  77 y.o. male  with past medical history of Stage 3 NSCLC (currently on immunotherapy with Durvalumab), CHF (EF 20-25% per Echo 05/2022), Afib on Xarelto, COPD, CAD, and HTN admitted on 10/29/2022 with worsening shortness of breath.   He is being treated for acute hypoxic respiratory failure due to pneumonia and CHF exacerbation.  Palliative care involved to discuss ongoing goals of care and symptom management.   Today's Discussion 11/06/2022  *Please note that this is a verbal dictation therefore any spelling or grammatical errors are due to the "Dragon Medical One" system interpretation.  Chart reviewed inclusive of vital signs, progress notes, laboratory results, and diagnostic images.   I met with Benjamin Cook and his wife, Benjamin Cook at bedside this morning.  Benjamin Cook shares that his breathing has improved. He shares improvement in hand neuropathy. He denies nausea.   Reviewed PNA course and anticipated symptom improvement with treatment.   Benjamin Cook expressed that he is going to be discharged to home this afternoon.   Questions and concerns addressed/Palliative Support Provided.   Objective Assessment: Vital Signs Vitals:   11/06/22 0720 11/06/22 0810  BP: (!) 106/50   Pulse:  79  Resp: (!) 21 20  Temp: 97.6 F (36.4 C)   SpO2: 100% 98%    Intake/Output Summary (Last 24 hours) at 11/06/2022 1034 Last data filed at 11/05/2022 1700 Gross per 24 hour  Intake 180 ml  Output 150 ml  Net 30 ml   Last Weight  Most recent update: 11/06/2022  4:41 AM    Weight  56 kg (123 lb 7.3 oz)            Gen:  Frail elderly AA M in NAD HEENT: moist mucous membranes CV: Regular rate and rhythm  PULM: On 3LPM [place], breathing is even and nonlabored ABD: soft/nontender  EXT: No edema Neuro: Alert and oriented x3  SUMMARY OF RECOMMENDATIONS   DNR/DNI  Treat the treatable  Patient is considering stopping his immunotherapy but wants to  discuss with his oncologist  Follow up with Royal Hawthorn PMT at [facility] for discharge this afternoon  Symptoms Management: Hypoxic Respiratory Failure: - Continue supplemental O2 - Additional PNA/COPD Management per primary  Neuropathy: - Continue Gabapentin 100mg  PO at bedtime  Time: 25 ______________________________________________________________________________________ Lamarr Lulas [place] Palliative Medicine Team Team Cell Phone: [phone] Please utilize secure chat with additional questions, if there is no response within 30 minutes please call the above phone number  Palliative Medicine Team providers are available by phone from 7am to 7pm daily and can be reached through the team cell phone.  Should this patient require assistance outside of these hours, please call the patient's attending physician.

## 2022-11-06 NOTE — Discharge Instructions (Signed)
Thank you for letting us care for you during your stay. You were admitted to the [facility] Medicine Teaching Service.   You were admitted for low oxygen levels. We suspect this was related to an exacerbation of your heart failure. We treated you with medication to help remove excess fluid off of your body. We also treated you with oxygen, and antibiotics for your pneumonia.   We recommend follow up specifically for the following issues: Follow up with your Cardiologist so they can further manage your blood pressure and heart failure medications Follow up with your Oncologist so they can further manage your lung cancer treatments.  Follow up with your primary care physician in 1 week    If your symptoms worsen or return, please return to the hospital. If you experience any of the following symptoms, please return to the hospital immediately: You are having trouble breathing. You lose consciousness. Your heart starts beating very fast. Your fingers, lips, or other areas of your body turn blue. You are confused.  Please let us know if you have questions about your stay at [facility].

## 2022-11-06 NOTE — TOC Transition Note (Addendum)
Transition of Care [facility]) - CM/SW Discharge Note   Patient Details  Name: Benjamin Cook MRN: [id number] Date of Birth: 11/08/1945  Transition of Care [facility]) CM/SW Contact:  Leone Haven, RN Phone Number: 11/06/2022, 10:19 AM   Clinical Narrative:    For dc today, has oxygen tank in room, has transportation.  TOC to fill meds.  Patient wants a rollator also, NCM made referral to [facility] for rollator. This will be brought up to the room. NCM notified Lynette with [employer].   Final next level of care: Home w Home Health Services Barriers to Discharge: Continued Medical Work up   Patient Goals and CMS Choice CMS Medicare.gov Compare Post Acute Care list provided to:: Patient Represenative (must comment) Choice offered to / list presented to : Spouse  Discharge Placement                         Discharge Plan and Services Additional resources added to the After Visit Summary for   In-house Referral: NA Discharge Planning Services: CM Consult Post Acute Care Choice: Home Health          DME Arranged: Oxygen DME Agency: [employer] Date DME Agency Contacted: 11/05/22 Time DME Agency Contacted: 1409 Representative spoke with at DME Agency: Vaughan Basta HH Arranged: PT, OT HH Agency: [facility] Date HH Agency Contacted: 11/01/22 Time HH Agency Contacted: [phone] Representative spoke with at [facility] Agency: Haywood Lasso  Social Determinants of Health (SDOH) Interventions SDOH Screenings   Food Insecurity: No Food Insecurity (10/29/2022)  Housing: Low Risk  (10/30/2022)  Recent Concern: Housing - Medium Risk (10/29/2022)  Transportation Needs: No Transportation Needs (10/30/2022)  Utilities: Not At Risk (10/29/2022)  Alcohol Screen: Low Risk  (10/30/2022)  Financial Resource Strain: Low Risk  (10/30/2022)  Social Connections: Unknown (06/19/2021)   Received from [facility]  Tobacco Use: High Risk (10/29/2022)     Readmission Risk  Interventions     No data to display

## 2022-11-08 ENCOUNTER — Telehealth (HOSPITAL_COMMUNITY): Payer: Self-pay

## 2022-11-08 NOTE — Telephone Encounter (Signed)
Heart Failure Patient Advocate Encounter  Medication assistance forms for Jardiance have been started. Patient has signed forms.  Provider information will need to be completed before submitting.  Forms have been attached to patient chart under 'Media' tab. Routing to appropriate office.  Stephaine H, CPhT Rx Patient Advocate Phone: [phone] 

## 2022-11-10 ENCOUNTER — Other Ambulatory Visit: Payer: Self-pay | Admitting: Internal Medicine

## 2022-11-12 NOTE — Telephone Encounter (Signed)
Patient assistance application for Jardiance completed and faxed to [facility]

## 2022-11-13 ENCOUNTER — Encounter (HOSPITAL_COMMUNITY): Payer: Self-pay

## 2022-11-13 ENCOUNTER — Ambulatory Visit (HOSPITAL_COMMUNITY)
Admit: 2022-11-13 | Discharge: 2022-11-13 | Disposition: A | Discharge status: Home / Self Care | Payer: Medicare HMO | Attending: Physician Assistant | Admitting: Physician Assistant

## 2022-11-13 ENCOUNTER — Other Ambulatory Visit: Payer: Self-pay

## 2022-11-13 VITALS — BP 140/80 | HR 61 | Wt 142.2 lb

## 2022-11-13 DIAGNOSIS — I472 Ventricular tachycardia, unspecified: Secondary | ICD-10-CM | POA: Insufficient documentation

## 2022-11-13 DIAGNOSIS — J9691 Respiratory failure, unspecified with hypoxia: Secondary | ICD-10-CM | POA: Insufficient documentation

## 2022-11-13 DIAGNOSIS — Z9981 Dependence on supplemental oxygen: Secondary | ICD-10-CM | POA: Diagnosis not present

## 2022-11-13 DIAGNOSIS — I2510 Atherosclerotic heart disease of native coronary artery without angina pectoris: Secondary | ICD-10-CM | POA: Diagnosis not present

## 2022-11-13 DIAGNOSIS — F17290 Nicotine dependence, other tobacco product, uncomplicated: Secondary | ICD-10-CM | POA: Diagnosis not present

## 2022-11-13 DIAGNOSIS — Z955 Presence of coronary angioplasty implant and graft: Secondary | ICD-10-CM | POA: Diagnosis not present

## 2022-11-13 DIAGNOSIS — I255 Ischemic cardiomyopathy: Secondary | ICD-10-CM | POA: Insufficient documentation

## 2022-11-13 DIAGNOSIS — Z7901 Long term (current) use of anticoagulants: Secondary | ICD-10-CM | POA: Diagnosis not present

## 2022-11-13 DIAGNOSIS — Z95810 Presence of automatic (implantable) cardiac defibrillator: Secondary | ICD-10-CM | POA: Diagnosis not present

## 2022-11-13 DIAGNOSIS — Z79899 Other long term (current) drug therapy: Secondary | ICD-10-CM | POA: Diagnosis not present

## 2022-11-13 DIAGNOSIS — J449 Chronic obstructive pulmonary disease, unspecified: Secondary | ICD-10-CM | POA: Diagnosis not present

## 2022-11-13 DIAGNOSIS — I110 Hypertensive heart disease with heart failure: Secondary | ICD-10-CM | POA: Diagnosis not present

## 2022-11-13 DIAGNOSIS — C3490 Malignant neoplasm of unspecified part of unspecified bronchus or lung: Secondary | ICD-10-CM | POA: Insufficient documentation

## 2022-11-13 DIAGNOSIS — Z66 Do not resuscitate: Secondary | ICD-10-CM | POA: Insufficient documentation

## 2022-11-13 DIAGNOSIS — I5020 Unspecified systolic (congestive) heart failure: Secondary | ICD-10-CM

## 2022-11-13 DIAGNOSIS — I5022 Chronic systolic (congestive) heart failure: Secondary | ICD-10-CM | POA: Diagnosis not present

## 2022-11-13 DIAGNOSIS — I480 Paroxysmal atrial fibrillation: Secondary | ICD-10-CM | POA: Insufficient documentation

## 2022-11-13 DIAGNOSIS — Z7984 Long term (current) use of oral hypoglycemic drugs: Secondary | ICD-10-CM | POA: Insufficient documentation

## 2022-11-13 LAB — COMPREHENSIVE METABOLIC PANEL
ALT: 31 U/L (ref 0–44)
AST: 29 U/L (ref 15–41)
Albumin: 2.4 g/dL — ABNORMAL LOW (ref 3.5–5.0)
Alkaline Phosphatase: 76 U/L (ref 38–126)
Anion gap: 8 (ref 5–15)
BUN: 16 mg/dL (ref 8–23)
CO2: 25 mmol/L (ref 22–32)
Calcium: 8.6 mg/dL — ABNORMAL LOW (ref 8.9–10.3)
Chloride: 106 mmol/L (ref 98–111)
Creatinine, Ser: 0.97 mg/dL (ref 0.61–1.24)
GFR, Estimated: >60 mL/min (ref >60)
Glucose, Bld: 91 mg/dL (ref 70–99)
Potassium: 4.1 mmol/L (ref 3.5–5.1)
Sodium: 139 mmol/L (ref 135–145)
Total Bilirubin: 0.8 mg/dL (ref 0.3–1.2)
Total Protein: 5.8 g/dL — ABNORMAL LOW (ref 6.5–8.1)

## 2022-11-13 LAB — BRAIN NATRIURETIC PEPTIDE: B Natriuretic Peptide: 265.2 pg/mL — ABNORMAL HIGH (ref 0.0–100.0)

## 2022-11-13 MED ORDER — RIVAROXABAN 15 MG PO TABS: 15.0000 mg | ORAL_TABLET | Freq: Every day | ORAL | 5 refills | Status: DC

## 2022-11-13 MED ORDER — METOPROLOL SUCCINATE ER 25 MG PO TB24: 12.5000 mg | ORAL_TABLET | Freq: Two times a day (BID) | ORAL | 2 refills | Status: DC

## 2022-11-13 MED ORDER — EMPAGLIFLOZIN 10 MG PO TABS: 10.0000 mg | ORAL_TABLET | Freq: Every day | ORAL | 2 refills | Status: DC

## 2022-11-13 MED ORDER — LOSARTAN POTASSIUM 25 MG PO TABS: 12.5000 mg | ORAL_TABLET | Freq: Every day | ORAL | 3 refills | Status: DC

## 2022-11-13 MED ORDER — AMIODARONE HCL 400 MG PO TABS: 200.0000 mg | ORAL_TABLET | Freq: Two times a day (BID) | ORAL | 2 refills | Status: DC

## 2022-11-13 MED ORDER — FUROSEMIDE 40 MG PO TABS: 20.0000 mg | ORAL_TABLET | Freq: Every day | ORAL | 2 refills | Status: DC

## 2022-11-13 MED ORDER — SPIRONOLACTONE 25 MG PO TABS: 12.5000 mg | ORAL_TABLET | Freq: Every day | ORAL | 2 refills | Status: DC

## 2022-11-13 MED ORDER — ATORVASTATIN CALCIUM 80 MG PO TABS: ORAL_TABLET | ORAL | 2 refills | Status: DC

## 2022-11-13 NOTE — Patient Instructions (Signed)
Medication Changes:  INCREASE LASIX (FUROSEMIDE) TO 40MG  DAILY FOR 4 DAYS ONLY THEN RETURN TO NORMAL DOSE OF 20MG  DAILY   START: LOSARTAN 12.5MG  ONCE DAILY   Lab Work:  Labs done today, your results will be available in MyChart, we will contact you for abnormal readings.  THEN PLEASE HAVE REPEAT LABS AT GENERAL CARDIOLOGY APPOINTMENT ON 10/21- MESSAGE SENT OVER TO THEM   Follow-Up in: AS NEEDED   At the [facility], you and your health needs are our priority. We have a designated team specialized in the treatment of Heart Failure. This Care Team includes your primary Heart Failure Specialized Cardiologist (physician), Advanced Practice Providers (APPs- Physician Assistants and Nurse Practitioners), and Pharmacist who all work together to provide you with the care you need, when you need it.   You may see any of the following providers on your designated Care Team at your next follow up:  Dr. Arvilla Meres Dr. Marca Ancona Dr. Dorthula Nettles Dr. Theresia Bough Tonye Becket, NP Robbie Lis, [place] [facility] [place], [place] Brynda Peon, NP [place] Lee, NP Karle Plumber, PharmD   Please be sure to bring in all your medications bottles to every appointment.   Need to Contact us:  If you have any questions or concerns before your next appointment please send [place] a message through [place] or call our office at [phone].    TO LEAVE A MESSAGE FOR THE NURSE SELECT OPTION 2, PLEASE LEAVE A MESSAGE INCLUDING: YOUR NAME DATE OF BIRTH CALL BACK NUMBER REASON FOR CALL**this is important as we prioritize the call backs  YOU WILL RECEIVE A CALL BACK THE SAME DAY AS LONG AS YOU CALL BEFORE 4:00 PM

## 2022-11-13 NOTE — Progress Notes (Addendum)
HEART & VASCULAR TRANSITION OF CARE CONSULT NOTE     Referring Physician: Dr. Deirdre Priest Primary Care: Dr. Sherryll Burger Primary Cardiologist: Dr. [name]  HPI: Referred to clinic by Dr. Deirdre Priest with [facility] Medicine teaching service for heart failure consultation. 77 y.o. male with history of CAD, ICM,/HFrEF s/p ICD, VT s/p ablation 09/22, PAF, AAA s/p EVAR in 2019, stage III NSCLC s/p radiation, chemotherapy and currently on immunotherapy, severe COPD.   He had PCI to LAD in 2001.  He was admitted with VT in 09/22. LHC demonstrated 70% mCx, 75% dLM, 70% m RCA, 50% p to m RCA, patent LAD stent. Med management recommended. Not felt to be a surgical or PCI candidate. He later underwent VT ablation in 09/22.   He was admitted 10/29/22 with acute respiratory failure requiring BiPAP. Respiratory failure felt to be 2/2 to combination of PNA in setting of baseline advanced COPD and lung cancer w/ possible radiation fibrosis + a/c HFrEF. Had been off entresto and spiro for 3 months d/t hypotension. Also had nausea related to chemotherapy limiting use of medications. He was diuresed with IV lasix. Treated with abx. Cardiology consulted. Goals of care discussion recommended d/t overall poor prognosis.  He was seen by Palliative Care. DNR/DNI status confirmed. He is considering stopping Immunotherapy.   He is here today for hospital follow-up. Accompanied by his wife who assists with the history. His appetite has improved significantly over the last couple of weeks. His weight had gotten down to 120s several months ago while on chemotherapy. Weight in hospital day of discharge 10/02 was 124 lb, he is 142 lb in clinic today and low 130s at home. Has been eating a lot of pizza. His wife tries to prepare some low sodium meals from scratch. Has stopped using a salt shaker. He got really weak towards end of summer and uses a wheelchair for far distances. Ambulates with a walker in the house. No dyspnea at rest. He is  now on supplemental O2.    Past Medical History:  Diagnosis Date   AAA (abdominal aortic aneurysm) ([name])    Adrenal mass ([name])    per pt this is remote (10 years) and benign by biopsy   AICD (automatic cardioverter/defibrillator) present    Anxiety    Arthritis    CAD (coronary artery disease)    a. s/p PCI to LAD 2001 b. myoview 2014 high risk with scar LAD/RCA territory but no ischemia   Cardiomyopathy, ischemic    Chronic HFrEF (heart failure with reduced ejection fraction) ([name])    COPD (chronic obstructive pulmonary disease) ([name])    smokes cigars but has quit cigarettes   Defibrillator discharge 04/21/2019   Diverticulitis    Dyspnea    Flu 03/2015   HTN (hypertension)    Hyperlipidemia    Paroxysmal atrial fibrillation ([name]) 03/2015   chads2vasc score is 4   PSA (psoriatic arthritis) ([name])    increased   PVD (peripheral vascular disease) ([name])    VT (ventricular tachycardia) ([name])     Current Outpatient Medications  Medication Sig Dispense Refill   acetaminophen (TYLENOL) 500 MG tablet Take 500-1,000 mg by mouth every 8 (eight) hours as needed (for pain).      amiodarone (PACERONE) 400 MG tablet Take 0.5 tablets (200 mg total) by mouth 2 (two) times daily. 90 tablet 2   atorvastatin (LIPITOR) 80 MG tablet TAKE 1 TABLET BY MOUTH  DAILY AT 6 PM 90 tablet 2   Chlorpheniramine Maleate (ALLERGY  RELIEF PO) Take 1 tablet by mouth daily as needed (allergies).     empagliflozin (JARDIANCE) 10 MG TABS tablet Take 1 tablet (10 mg total) by mouth daily before breakfast. 90 tablet 2   furosemide (LASIX) 40 MG tablet Take 0.5 tablets (20 mg total) by mouth daily. 45 tablet 2   gabapentin (NEURONTIN) 100 MG capsule Take 1 capsule (100 mg total) by mouth at bedtime. 30 capsule 0   losartan (COZAAR) 25 MG tablet Take 0.5 tablets (12.5 mg total) by mouth daily. 45 tablet 3   melatonin 5 MG TABS Take 1 tablet (5 mg total) by mouth at bedtime. 30 tablet 0   metoprolol succinate  (TOPROL-XL) 25 MG 24 hr tablet Take 0.5 tablets (12.5 mg total) by mouth 2 (two) times daily. 90 tablet 2   mometasone-formoterol (DULERA) 100-5 MCG/ACT AERO Inhale 2 puffs into the lungs in the morning and at bedtime. 8.8 each 1   Multiple Vitamin (MULTIVITAMIN) capsule Take 1 capsule by mouth daily.     ondansetron (ZOFRAN) 8 MG tablet Take 1 tablet (8 mg total) by mouth every 8 (eight) hours as needed for nausea. 30 tablet 3   pantoprazole (PROTONIX) 40 MG tablet Take 40 mg by mouth daily.     predniSONE (DELTASONE) 20 MG tablet Take 2 tablets (40 mg total) by mouth daily with breakfast for 3 days, THEN 1.5 tablets (30 mg total) daily with breakfast for 3 days, THEN 1 tablet (20 mg total) daily with breakfast for 3 days, THEN 0.5 tablets (10 mg total) daily with breakfast for 3 days. 15 tablet 0   Rivaroxaban (XARELTO) 15 MG TABS tablet Take 1 tablet (15 mg total) by mouth daily with supper. 30 tablet 5   senna-docusate (SENOKOT-S) 8.6-50 MG tablet Take 2 tablets by mouth daily. 60 tablet 1   spironolactone (ALDACTONE) 25 MG tablet Take 0.5 tablets (12.5 mg total) by mouth daily. 45 tablet 2   tamsulosin (FLOMAX) 0.4 MG CAPS capsule Take 1 capsule (0.4 mg total) by mouth daily. 30 capsule 0   No current facility-administered medications for this encounter.    Allergies  Allergen Reactions   Paclitaxel Other (See Comments)    Had chest pain after completion of chemotherapy on 08/12/2022      Social History   Socioeconomic History   Marital status: Married    Spouse name: Althene   Number of children: 3   Years of education: Not on file   Highest education level: 10th grade  Occupational History   Occupation: Therapist, music care   Occupation: Retired  Tobacco Use   Smoking status: Some Days    Types: Cigars    Last attempt to quit: 2020    Years since quitting: 4.7   Smokeless tobacco: Never   Tobacco comments:    smokes cigars now, no longer smokes cigarettes but previously smoked 2ppd   Vaping Use   Vaping status: Never Used  Substance and Sexual Activity   Alcohol use: No   Drug use: No   Sexual activity: Not Currently    Partners: Female    Birth control/protection: None  Other Topics Concern   Not on file  Social History Narrative   Not on file   Social Determinants of Health   Financial Resource Strain: Low Risk  (10/30/2022)   Overall Financial Resource Strain (CARDIA)    Difficulty of Paying Living Expenses: Not very hard  Food Insecurity: No Food Insecurity (10/29/2022)   Hunger Vital Sign  Worried About Programme researcher, broadcasting/film/video in the Last Year: Never true    Ran Out of Food in the Last Year: Never true  Transportation Needs: No Transportation Needs (10/30/2022)   PRAPARE - Administrator, Civil Service (Medical): No    Lack of Transportation (Non-Medical): No  Physical Activity: Not on file  Stress: Not on file  Social Connections: Unknown (06/19/2021)   [facility]   Social Network    Social Network: Not on file  Intimate Partner Violence: Patient Unable To Answer (10/29/2022)   Humiliation, Afraid, Rape, and Kick questionnaire    Fear of Current or Ex-Partner: Patient unable to answer    Emotionally Abused: Patient unable to answer    Physically Abused: Patient unable to answer    Sexually Abused: Patient unable to answer      Family History  Problem Relation Age of Onset   Cirrhosis Mother        due to ETOH   Cancer Neg Hx     Vitals:   11/13/22 1021  BP: (!) 140/80  Pulse: 61  SpO2: 98%  Weight: 64.5 kg (142 lb 3.2 oz)    PHYSICAL EXAM: General:  Chronically ill appearing, thin, elderly male HEENT: normal Neck: supple. no JVD.  Cor: PMI nondisplaced. Regular rate & rhythm. No rubs, gallops or murmurs. Lungs:crackles in bases, left > right Abdomen: soft, nontender, nondistended.  Extremities: no cyanosis, clubbing, rash, edema Neuro: alert & oriented x 3. Affect pleasant.  ECG: SR with  1st degree AVB 63 bpm, RBBB, QRS 160 ms, inferior Qs  ICD interrogation: no AT/AF, activity level < 1hr/day, thoracic impedance trending down over the last week   ASSESSMENT & PLAN: HFrEF/ICM -Echo 09/24: EF 25-30%, RWMA, RV okay -Limited echo 09/24: EF 20-25%, LV severely dilated, RV okay -NYHA IIIb -Volume looks okay on exam. However, his weight is up at least 10 lb since discharge and thoracic impedance has been trending down on his device since discharge. Taking 20 mg furosemide daily, increase to 40 mg daily X 4 days then resume usual dose. Cut back sodium intake. -Continue metoprolol xl 12.5 mg BID -Continue spironolactone 12.5 mg daily -Continue Jardiance 10 mg daily -Entresto recently stopped d/t hypotension. BP stable today. Start losartan 12.5 mg daily. If tolerated, could eventually transition back to [place]. -CMET/BNP today, repeat BMET 2 weeks (will see if this can be done at follow-up with Cardiology)  2. CAD -S/p PCI to LAD in 2001 -LHC in 09/22 with multivessel CAD including LM disease. Not felt to be candidate for PCI or CABG -Denies angina -No aspirin with need for anticoagulation. Continue Atorvastatin 80 mg daily  3. VT -has ICD -s/p VT ablation in 09/22 -On amiodarone 200 mg daily  4. ? Possible LV thrombus - Could not exclude LV thrombus on echo in 09/25, unable to assess LV apex. Limited echo obtained with definity, no LV thrombus noted - He has already been on Xarelto for PAF  5. PAF - No recent episodes on device check - Xarelto recently reduced from 20 mg to 15 mg daily d/t CrCl -On amiodarone. Recent LFTs okay. TSH okay but free T4 mildly elevated in 8/24. To be followed by EP  5. Stage III NSCLC - S/p chemotherapy and XRT - Started immunotherapy in 08/24  6. Advanced COPD Respiratory failure with hypoxia Recent PNA -Now on supplemental O2    Recommend ongoing goals of care discussions. He is now DNR/DNI.  Referred to HFSW (PCP,  Medications, Transportation, ETOH Abuse, Drug Abuse, Insurance, Financial ): Yes or No Refer to Pharmacy: Yes or No Refer to Home Health: Yes on No Refer to Advanced Heart Failure Clinic: Yes or no  Refer to General Cardiology: Yes or No  Follow up  PRN, has follow-up with Cardiology on 11/25/22

## 2022-11-13 NOTE — Telephone Encounter (Signed)
Prescription refill request for Xarelto received.  Indication: PAF Last office visit: 06/28/22  Velta Addison MD Weight: 59.0kg Age: 77 Scr: 1.06 on 11/05/22  Epic CrCl: 47.16  Based on above findings Xarelto 15mg  daily is the appropriate dose.  Refill approved.

## 2022-11-17 ENCOUNTER — Other Ambulatory Visit: Payer: Self-pay | Admitting: Cardiology

## 2022-11-24 NOTE — Progress Notes (Unsigned)
Cardiology Office Note:    Date:  11/25/2022  ID:  RAMIN FUMERO, DOB 07-10-1945, MRN [id number] PCP: [name], MD  [facility] Providers Cardiologist:  Olga Millers, MD Electrophysiologist:  [name], MD       Patient Profile:      Hyperlipidemia Hypertension Coronary artery disease Paroxysmal atrial fibrillation Ventricular tachycardia S/p ablation 9/22 Chronic combined systolic and diastolic heart failure Echo 4/24 EF 20 to 25% S/P ICD Stage III NSCLC S/p radiation, chemotherapy, currently on immunotherapy COPD Ischemic cardiomyopathy AAA S/p EVAR in 2019      History of Present Illness:  Discussed the use of AI scribe software for clinical note transcription with the patient, who gave verbal consent to proceed.  MCIHAEL BACH is a 77 y.o. male who presents for hospital admission follow-up regarding CHF exacerbation.  He has history of LAD stenting back in 2001.  Cardiac catheterization 10/12/2020 with 75% calcified distal left main, previously placed mid LAD stent is widely patent.  He has 70% mid AV groove circumflex and RCA disease.  At the time he was not an operative candidate.  On 10/29/2022 he was admitted and presented to the emergency room with complaints of acute shortness of breath for 1 month that had worsened the day of his admission, he was hypoxic to the 80s and placed on BiPAP in the ED.  Chest x-ray upon admission no pneumonia/pulmonary edema, EKG with no ischemia.  He became febrile during admission and repeat chest x-ray demonstrated pneumonia, blood cultures demonstrated no growth at discharge.  BNP elevated to 2.4K.  He has history of atrial fibrillation, on admission in sinus rhythm.  He had difficulty maintaining his oxygen saturation during admission after numerous noninvasive ventilation techniques.  He was trialed on steroids with prednisone 40 mg x 5 days.  It was noted he was difficult to wean but upon discharge patient  maintained stable O2 saturation on 3 L low flow nasal cannula.  Was found that the etiology of his hypoxemia is likely multifactorial including worsening ability compensate for underlying lung disease, chemoradiation, lung cancer, heart failure, amiodarone therapy, COPD.  He was diuresed with multiple doses of IV Lasix and then transition to home p.o. Lasix.  Goals of care discussion recommended due to his overall poor prognosis, he was seen by palliative care, DNR/DNI status was confirmed.  GDMT for heart failure discontinued in 6/24 including Entresto, home Lasix, spironolactone due to hypotension.  He had noted worsening shortness of breath episodes since discontinuation.  GDMT was de-escalated during admission due to risk of falls will soft blood pressure.  Toprol was reduced to 12.5 mg twice daily, spironolactone reduced to 12.5 mg daily, and his Sherryll Burger was discontinued on 11/05/2022.  Jardiance was started and admission at 10 mg daily and IV Lasix was transitioned to home p.o. Lasix 20 mg daily.  His Xarelto was decreased from 20 mg daily to 15 mg daily given his creatinine clearance.  He was seen by the advanced heart failure clinic on 11/13/2022.  At that time he was doing well.  His weight was 142 pounds while on discharge to the hospital on 11/06/2022 he was 124 pounds.  He was noted he was eating a lot of pizza.  His Lasix was increased to 40 mg daily for 4 days and then resume the 20 mg dose.  Much education was given on decreasing sodium intake.  His blood pressure was stable during this visit and he was started on  losartan 12.5 mg with a goal of eventually transitioning back to [place].  Today Mr. Peres is doing well from a cardiac standpoint.  He states that he is on 3 L O2 consistently at home.  He notes that he is sleeping better and has more of an appetite.  He denies any shortness of breath, DOE, orthopnea.  Of note his weight today is 139, dry weight dated on his discharge discharge from  hospital was 124 pounds.  He is taking his daily weights at home, he is averaging about 1 pound weight gain per day.  On 10/9 heart failure team increased his Lasix from 20 to 40 mg for 4 days he noted he lost a total of 3 pounds.  Of note he tells me that he has his appetite back and that he is eating spaghetti, pizza, cherry pies. He notes that he is not using the table salts but is not looking into the sodium levels on the box for food.  He reports that he has not received his losartan 12.5 mg from his mail-in pharmacy, so he has not started it yet.    He denies chest pain, shortness of breath, lower extremity edema, fatigue, palpitations, melena, hematuria, hemoptysis, diaphoresis, weakness, presyncope, syncope, orthopnea, and PND.       Review of Systems  Constitutional: Negative for weight gain and weight loss.  Cardiovascular:  Positive for dyspnea on exertion. Negative for chest pain, claudication, cyanosis, irregular heartbeat, leg swelling, near-syncope, orthopnea, palpitations, paroxysmal nocturnal dyspnea and syncope.  Respiratory:  Negative for cough, hemoptysis and shortness of breath.   Gastrointestinal:  Negative for abdominal pain, hematochezia and melena.  Genitourinary:  Negative for hematuria.     See HPI     Studies Reviewed:       Echocardiogram 10/30/2022  IMPRESSIONS   1. Left ventricular ejection fraction, by estimation, is 25 to 30%. The  left ventricle has severely decreased function. The left ventricle  demonstrates regional wall motion abnormalities (see scoring  diagram/findings for description). The left  ventricular internal cavity size was moderately dilated. Left ventricular  diastolic parameters are consistent with Grade I diastolic dysfunction  (impaired relaxation). Elevated left ventricular end-diastolic pressure.   2. Right ventricular systolic function is normal. The right ventricular  size is normal. Tricuspid regurgitation signal is inadequate  for assessing  PA pressure.   3. The mitral valve is normal in structure. Trivial mitral valve  regurgitation. No evidence of mitral stenosis.   4. The aortic valve is tricuspid. Aortic valve regurgitation is not  visualized. Aortic valve sclerosis/calcification is present, without any  evidence of aortic stenosis.   5. The inferior vena cava is normal in size with greater than 50%  respiratory variability, suggesting right atrial pressure of 3 mmHg.   Risk Assessment/Calculations:             Physical Exam:   VS:  BP (!) 110/56   Pulse 80   Ht 5\' 10"  (1.778 m)   Wt 139 lb (63 kg)   SpO2 95%   BMI 19.94 kg/m    Wt Readings from Last 3 Encounters:  11/25/22 139 lb (63 kg)  11/13/22 142 lb 3.2 oz (64.5 kg)  11/06/22 123 lb 7.3 oz (56 kg)    Constitutional:      Appearance: Normal and healthy appearance.  Neck:     Vascular: JVD normal.  Pulmonary:     Effort: Pulmonary effort is normal.     Breath sounds: Normal  breath sounds.  Chest:     Chest wall: Not tender to palpatation.  Cardiovascular:     PMI at left midclavicular line. Normal rate. Regular rhythm. Normal S1. Normal S2.      Murmurs: There is no murmur.     No gallop.  No click. No rub.  Pulses:    Intact distal pulses.  Edema:    Peripheral edema absent.  Musculoskeletal:     Cervical back: Neck supple. Skin:    General: Skin is warm.  Neurological:     General: No focal deficit present.     Mental Status: Oriented to person, place and time.  Psychiatric:        Behavior: Behavior is cooperative.        Assessment and Plan:  1.  Heart failure reduced ejection fraction -9/24 echo with EF 25 to 30%, left ventricle severely dilated -NYHA III -Euvolemic on exam, however still up nearly 18 pounds since discharge dry weight -Plan increase Lasix to 40 mg daily x 4 days, then to resume his 20 mg a day dose -I feel his diet is contributing to his weight gain as he is eating high carbohydrate meals, snacks,  dessert -Much education given on low-sodium less than 2 g diet -Was started on losartan 12.5 mg with heart failure clinic on 11/13/2022.  He tells me today that he has never received this prescription in the mail so he has not started it -We called his pharmacy today and they will overnight it.  He plans to start this tomorrow -BMET 2 weeks from now -Continue Jardiance 10 mg, Aldactone 25 mg, Toprol XL 12.5 mg twice daily  2.  Coronary artery disease -Status post PCI to LAD in 2001 -On 9/22, LHC showed multivessel CAD.  At the time not felt to be candidate for PCI or CABG -He denies any chest pain, exertional angina, DOE -Continue Lipitor 80 mg -Not on antiplatelet with need for anticoagulation, continue Xarelto 15 mg  3.  Paroxysmal atrial fibrillation -No complaints of palpitations, tachycardia -Continue Xarelto 15 mg daily -Continue amiodarone 200 mg twice daily  4 Ventricular tachycardia -S/p ablation 12/22 -Denies tachycardia, chest pain -Continue amiodarone 200 mg twice daily  5.  Hypertension -Blood pressure 110/56 -No dizziness/lightheadedness -Plan to have him start losartan 12.5 mg -Heart healthy diet encouraged  6.  NSCLC stage III -On immunotherapy -Continue to follow with oncology  7.  COPD -Now on continuous 3 L nasal cannula at home -He denies shortness of breath                Dispo:  Return in about 2 months (around 01/25/2023).  Signed, Denyce Daviyon, AGNP-C

## 2022-11-25 ENCOUNTER — Encounter: Payer: Self-pay | Admitting: Physician Assistant

## 2022-11-25 ENCOUNTER — Ambulatory Visit: Payer: Medicare HMO | Attending: Physician Assistant | Admitting: Emergency Medicine

## 2022-11-25 VITALS — BP 110/56 | HR 80 | Ht 70.0 in | Wt 139.0 lb

## 2022-11-25 DIAGNOSIS — I2510 Atherosclerotic heart disease of native coronary artery without angina pectoris: Secondary | ICD-10-CM

## 2022-11-25 DIAGNOSIS — I10 Essential (primary) hypertension: Secondary | ICD-10-CM | POA: Diagnosis not present

## 2022-11-25 DIAGNOSIS — I480 Paroxysmal atrial fibrillation: Secondary | ICD-10-CM

## 2022-11-25 DIAGNOSIS — I472 Ventricular tachycardia, unspecified: Secondary | ICD-10-CM

## 2022-11-25 DIAGNOSIS — I5020 Unspecified systolic (congestive) heart failure: Secondary | ICD-10-CM

## 2022-11-25 DIAGNOSIS — Z79899 Other long term (current) drug therapy: Secondary | ICD-10-CM

## 2022-11-25 NOTE — Patient Instructions (Addendum)
Medication Instructions:  TAKE 40 MG OF LASIX DAILY FOR 4 DAYS THEN BACK TO 20 MG DAILY.  *If you need a refill on your cardiac medications before your next appointment, please call your pharmacy*   Lab Work: BMP IN 2 WEEKS AFTER STARTING LOSARTAN If you have labs (blood work) drawn today and your tests are completely normal, you will receive your results only by: MyChart Message (if you have MyChart) OR A paper copy in the mail If you have any lab test that is abnormal or we need to change your treatment, we will call you to review the results.   Testing/Procedures: NO TESTING   Follow-Up: At [facility], you and your health needs are our priority.  As part of our continuing mission to provide you with exceptional heart care, we have created designated Provider Care Teams.  These Care Teams include your primary Cardiologist (physician) and Advanced Practice Providers (APPs -  Physician Assistants and Nurse Practitioners) who all work together to provide you with the care you need, when you need it.  Your next appointment:   2 month(s)  Provider:   Azalee Course, PA

## 2022-11-26 ENCOUNTER — Inpatient Hospital Stay: Payer: Medicare HMO | Admitting: Dietician

## 2022-11-26 ENCOUNTER — Inpatient Hospital Stay: Payer: Medicare HMO | Admitting: Hematology and Oncology

## 2022-11-26 ENCOUNTER — Encounter: Payer: Self-pay | Admitting: Hematology and Oncology

## 2022-11-26 ENCOUNTER — Inpatient Hospital Stay: Payer: Medicare HMO | Attending: Hematology and Oncology

## 2022-11-26 ENCOUNTER — Inpatient Hospital Stay: Payer: Medicare HMO

## 2022-11-26 VITALS — BP 128/50 | HR 67 | Temp 98.2°F | Resp 20

## 2022-11-26 VITALS — BP 124/57 | HR 97 | Temp 97.8°F | Resp 18 | Ht 70.0 in | Wt 140.2 lb

## 2022-11-26 DIAGNOSIS — D61818 Other pancytopenia: Secondary | ICD-10-CM

## 2022-11-26 DIAGNOSIS — Z79899 Other long term (current) drug therapy: Secondary | ICD-10-CM | POA: Diagnosis not present

## 2022-11-26 DIAGNOSIS — E440 Moderate protein-calorie malnutrition: Secondary | ICD-10-CM

## 2022-11-26 DIAGNOSIS — C3490 Malignant neoplasm of unspecified part of unspecified bronchus or lung: Secondary | ICD-10-CM | POA: Insufficient documentation

## 2022-11-26 DIAGNOSIS — I5023 Acute on chronic systolic (congestive) heart failure: Secondary | ICD-10-CM | POA: Diagnosis not present

## 2022-11-26 DIAGNOSIS — Z5112 Encounter for antineoplastic immunotherapy: Secondary | ICD-10-CM | POA: Diagnosis present

## 2022-11-26 DIAGNOSIS — C3491 Malignant neoplasm of unspecified part of right bronchus or lung: Secondary | ICD-10-CM

## 2022-11-26 LAB — CBC WITH DIFFERENTIAL (CANCER CENTER ONLY)
Abs Immature Granulocytes: 0.03 10*3/uL (ref 0.00–0.07)
Basophils Absolute: 0 10*3/uL (ref 0.0–0.1)
Basophils Relative: 1 %
Eosinophils Absolute: 0.1 10*3/uL (ref 0.0–0.5)
Eosinophils Relative: 3 %
HCT: 36.1 % — ABNORMAL LOW (ref 39.0–52.0)
Hemoglobin: 11.9 g/dL — ABNORMAL LOW (ref 13.0–17.0)
Immature Granulocytes: 1 %
Lymphocytes Relative: 10 %
Lymphs Abs: 0.5 10*3/uL — ABNORMAL LOW (ref 0.7–4.0)
MCH: 33.2 pg (ref 26.0–34.0)
MCHC: 33 g/dL (ref 30.0–36.0)
MCV: 100.8 fL — ABNORMAL HIGH (ref 80.0–100.0)
Monocytes Absolute: 0.8 10*3/uL (ref 0.1–1.0)
Monocytes Relative: 15 %
Neutro Abs: 3.8 10*3/uL (ref 1.7–7.7)
Neutrophils Relative %: 70 %
Platelet Count: 108 10*3/uL — ABNORMAL LOW (ref 150–400)
RBC: 3.58 MIL/uL — ABNORMAL LOW (ref 4.22–5.81)
RDW: 18.6 % — ABNORMAL HIGH (ref 11.5–15.5)
WBC Count: 5.3 10*3/uL (ref 4.0–10.5)
nRBC: 0 % (ref 0.0–0.2)

## 2022-11-26 LAB — CMP (CANCER CENTER ONLY)
ALT: 21 U/L (ref 0–44)
AST: 23 U/L (ref 15–41)
Albumin: 3.3 g/dL — ABNORMAL LOW (ref 3.5–5.0)
Alkaline Phosphatase: 65 U/L (ref 38–126)
Anion gap: 4 — ABNORMAL LOW (ref 5–15)
BUN: 11 mg/dL (ref 8–23)
CO2: 31 mmol/L (ref 22–32)
Calcium: 8.5 mg/dL — ABNORMAL LOW (ref 8.9–10.3)
Chloride: 104 mmol/L (ref 98–111)
Creatinine: 1.08 mg/dL (ref 0.61–1.24)
GFR, Estimated: >60 mL/min (ref >60)
Glucose, Bld: 122 mg/dL — ABNORMAL HIGH (ref 70–99)
Potassium: 4.2 mmol/L (ref 3.5–5.1)
Sodium: 139 mmol/L (ref 135–145)
Total Bilirubin: 0.4 mg/dL (ref 0.3–1.2)
Total Protein: 6.4 g/dL — ABNORMAL LOW (ref 6.5–8.1)

## 2022-11-26 LAB — TSH: TSH: 1.732 u[IU]/mL (ref 0.350–4.500)

## 2022-11-26 MED ORDER — SODIUM CHLORIDE 0.9 % IV SOLN: Freq: Once | INTRAVENOUS | Status: AC

## 2022-11-26 MED ORDER — HEPARIN SOD (PORK) LOCK FLUSH 100 UNIT/ML IV SOLN
500.0000 [IU] | Freq: Once | INTRAVENOUS | Status: AC | PRN
  Administered 2022-11-26: 500 [IU]

## 2022-11-26 MED ORDER — SODIUM CHLORIDE 0.9% FLUSH
10.0000 mL | INTRAVENOUS | Status: DC | PRN
  Administered 2022-11-26: 10 mL

## 2022-11-26 MED ORDER — SODIUM CHLORIDE 0.9 % IV SOLN
1500.0000 mg | Freq: Once | INTRAVENOUS | Status: AC
  Administered 2022-11-26: 1500 mg via INTRAVENOUS
  Filled 2022-11-26: qty 30

## 2022-11-26 NOTE — Progress Notes (Signed)
no pneumothorax or acute fracture. IMPRESSION: 1. Right mid lung infiltrate has decreased in density, but has mildly increased in size. 2. New airspace disease in the left mid lung. Electronically Signed   By: Darliss Cheney M.D.   On: 10/30/2022 19:20   ECHOCARDIOGRAM COMPLETE  Result Date: 10/30/2022    ECHOCARDIOGRAM REPORT   Patient Name:   Benjamin Cook Date of Exam: 10/30/2022 Medical Rec #:  [id number]         Height:       70.0 in Accession #:    [id number]        Weight:       127.6 lb Date of Birth:  28-Dec-1945         BSA:          1.725  m Patient Age:    77 years          BP:           135/63 mmHg Patient Gender: M                 HR:           84 bpm. Exam Location:  Inpatient Procedure: 2D Echo, Color Doppler and Cardiac Doppler Indications:    acute systolic chf  History:        Patient has prior history of Echocardiogram examinations, most                 recent 06/03/2022. Cardiomyopathy, Defibrillator, COPD and lung                 cancer, Arrythmias:Atrial Fibrillation; Risk                 Factors:Hypertension and Dyslipidemia.  Sonographer:    Delcie Roch RDCS Referring Phys: [id number] Jerilee Field SCHEVING IMPRESSIONS  1. Left ventricular ejection fraction, by estimation, is 25 to 30%. The left ventricle has severely decreased function. The left ventricle demonstrates regional wall motion abnormalities (see scoring diagram/findings for description). The left ventricular internal cavity size was moderately dilated. Left ventricular diastolic parameters are consistent with Grade I diastolic dysfunction (impaired relaxation). Elevated left ventricular end-diastolic pressure.  2. Right ventricular systolic function is normal. The right ventricular size is normal. Tricuspid regurgitation signal is inadequate for assessing PA pressure.  3. The mitral valve is normal in structure. Trivial mitral valve regurgitation. No evidence of mitral stenosis.  4. The aortic valve is tricuspid. Aortic valve regurgitation is not visualized. Aortic valve sclerosis/calcification is present, without any evidence of aortic stenosis.  5. The inferior vena cava is normal in size with greater than 50% respiratory variability, suggesting right atrial pressure of 3 mmHg. Compared to prior study, LVF appears slightly improved. Cannot adequately assess the LV apex. Recommend repeat limited echo with definity contrast. FINDINGS  Left Ventricle: Left ventricular ejection fraction, by estimation, is 25 to 30%. The left ventricle has severely decreased function. The left  ventricle demonstrates regional wall motion abnormalities. The left ventricular internal cavity size was moderately dilated. There is no left ventricular hypertrophy. Left ventricular diastolic parameters are consistent with Grade I diastolic dysfunction (impaired relaxation). Elevated left ventricular end-diastolic pressure. Right Ventricle: The right ventricular size is normal. No increase in right ventricular wall thickness. Right ventricular systolic function is normal. Tricuspid regurgitation signal is inadequate for assessing PA pressure. Left Atrium: Left atrial size was normal in size. Right Atrium: Right atrial size was normal in size. Pericardium: There is no evidence of pericardial  with tip over the low SVC region. No pneumothorax. Normal heart size and pulmonary vascularity. Bilateral mid and lower lung infiltrates are unchanged since prior studies and likely represent chronic fibrosis, possibly post treatment changes. Superimposed focal pneumonia would not be excluded in the appropriate clinical setting. No pleural effusions. No pneumothorax. Residual prominence of the right hilum could represent residual lesion or post treatment changes. IMPRESSION: No change since previous study. Residual fibrosis or infiltration in the mid lungs. Possible post treatment changes in the right hilum. Electronically Signed   By: Burman Nieves M.D.   On: 11/03/2022 01:21   ECHOCARDIOGRAM LIMITED  Result Date: 11/01/2022    ECHOCARDIOGRAM LIMITED REPORT   Patient Name:   Benjamin Cook Date of Exam: 11/01/2022 Medical Rec #:  [id number]         Height:       70.0 in Accession #:    [id number]        Weight:       132.9 lb Date of Birth:  79/04/1945         BSA:          1.755 m Patient Age:    77 years          BP:           124/58 mmHg Patient Gender: M                 HR:           83 bpm. Exam Location:  Inpatient  Procedure: Limited Echo, Limited Color Doppler and Intracardiac Opacification            Agent Indications:    CHF-Acute Systolic I50.21  History:        Patient has prior history of Echocardiogram examinations, most                 recent 10/30/2022. CHF and Cardiomyopathy, CAD, COPD,                 Arrythmias:Tachycardia and Atrial Fibrillation,                 Signs/Symptoms:Dyspnea and Shortness of Breath; Risk                 Factors:Hypertension, Dyslipidemia and Current Smoker.  Sonographer:    [name] Referring Phys: [id number]  THOMAS O'NEAL IMPRESSIONS  1. Left ventricular ejection fraction, by estimation, is 20 to 25%. Left ventricular ejection fraction by PLAX is 16 %. The left ventricle has severely decreased function. The left ventricle demonstrates regional wall motion abnormalities (see scoring diagram/findings for description). The left ventricular internal cavity size was severely dilated.  2. Right ventricular systolic function is normal. The right ventricular size is normal.  3. The mitral valve is normal in structure. No evidence of mitral valve regurgitation. No evidence of mitral stenosis.  4. The aortic valve is normal in structure. There is mild calcification of the aortic valve. There is mild thickening of the aortic valve. Aortic valve regurgitation is not visualized. No aortic stenosis is present.  5. The inferior vena cava is normal in size with greater than 50% respiratory variability, suggesting right atrial pressure of 3 mmHg. FINDINGS  Left Ventricle: Left ventricular ejection fraction, by estimation, is 20 to 25%. Left ventricular ejection fraction by PLAX is 16 %. The left ventricle has severely decreased function. The left ventricle demonstrates regional wall motion abnormalities. Definity contrast agent was given IV to delineate the left  no pneumothorax or acute fracture. IMPRESSION: 1. Right mid lung infiltrate has decreased in density, but has mildly increased in size. 2. New airspace disease in the left mid lung. Electronically Signed   By: Darliss Cheney M.D.   On: 10/30/2022 19:20   ECHOCARDIOGRAM COMPLETE  Result Date: 10/30/2022    ECHOCARDIOGRAM REPORT   Patient Name:   Benjamin Cook Date of Exam: 10/30/2022 Medical Rec #:  [id number]         Height:       70.0 in Accession #:    [id number]        Weight:       127.6 lb Date of Birth:  28-Dec-1945         BSA:          1.725  m Patient Age:    77 years          BP:           135/63 mmHg Patient Gender: M                 HR:           84 bpm. Exam Location:  Inpatient Procedure: 2D Echo, Color Doppler and Cardiac Doppler Indications:    acute systolic chf  History:        Patient has prior history of Echocardiogram examinations, most                 recent 06/03/2022. Cardiomyopathy, Defibrillator, COPD and lung                 cancer, Arrythmias:Atrial Fibrillation; Risk                 Factors:Hypertension and Dyslipidemia.  Sonographer:    Delcie Roch RDCS Referring Phys: [id number] Jerilee Field SCHEVING IMPRESSIONS  1. Left ventricular ejection fraction, by estimation, is 25 to 30%. The left ventricle has severely decreased function. The left ventricle demonstrates regional wall motion abnormalities (see scoring diagram/findings for description). The left ventricular internal cavity size was moderately dilated. Left ventricular diastolic parameters are consistent with Grade I diastolic dysfunction (impaired relaxation). Elevated left ventricular end-diastolic pressure.  2. Right ventricular systolic function is normal. The right ventricular size is normal. Tricuspid regurgitation signal is inadequate for assessing PA pressure.  3. The mitral valve is normal in structure. Trivial mitral valve regurgitation. No evidence of mitral stenosis.  4. The aortic valve is tricuspid. Aortic valve regurgitation is not visualized. Aortic valve sclerosis/calcification is present, without any evidence of aortic stenosis.  5. The inferior vena cava is normal in size with greater than 50% respiratory variability, suggesting right atrial pressure of 3 mmHg. Compared to prior study, LVF appears slightly improved. Cannot adequately assess the LV apex. Recommend repeat limited echo with definity contrast. FINDINGS  Left Ventricle: Left ventricular ejection fraction, by estimation, is 25 to 30%. The left ventricle has severely decreased function. The left  ventricle demonstrates regional wall motion abnormalities. The left ventricular internal cavity size was moderately dilated. There is no left ventricular hypertrophy. Left ventricular diastolic parameters are consistent with Grade I diastolic dysfunction (impaired relaxation). Elevated left ventricular end-diastolic pressure. Right Ventricle: The right ventricular size is normal. No increase in right ventricular wall thickness. Right ventricular systolic function is normal. Tricuspid regurgitation signal is inadequate for assessing PA pressure. Left Atrium: Left atrial size was normal in size. Right Atrium: Right atrial size was normal in size. Pericardium: There is no evidence of pericardial  no pneumothorax or acute fracture. IMPRESSION: 1. Right mid lung infiltrate has decreased in density, but has mildly increased in size. 2. New airspace disease in the left mid lung. Electronically Signed   By: Darliss Cheney M.D.   On: 10/30/2022 19:20   ECHOCARDIOGRAM COMPLETE  Result Date: 10/30/2022    ECHOCARDIOGRAM REPORT   Patient Name:   Benjamin Cook Date of Exam: 10/30/2022 Medical Rec #:  [id number]         Height:       70.0 in Accession #:    [id number]        Weight:       127.6 lb Date of Birth:  28-Dec-1945         BSA:          1.725  m Patient Age:    77 years          BP:           135/63 mmHg Patient Gender: M                 HR:           84 bpm. Exam Location:  Inpatient Procedure: 2D Echo, Color Doppler and Cardiac Doppler Indications:    acute systolic chf  History:        Patient has prior history of Echocardiogram examinations, most                 recent 06/03/2022. Cardiomyopathy, Defibrillator, COPD and lung                 cancer, Arrythmias:Atrial Fibrillation; Risk                 Factors:Hypertension and Dyslipidemia.  Sonographer:    Delcie Roch RDCS Referring Phys: [id number] Jerilee Field SCHEVING IMPRESSIONS  1. Left ventricular ejection fraction, by estimation, is 25 to 30%. The left ventricle has severely decreased function. The left ventricle demonstrates regional wall motion abnormalities (see scoring diagram/findings for description). The left ventricular internal cavity size was moderately dilated. Left ventricular diastolic parameters are consistent with Grade I diastolic dysfunction (impaired relaxation). Elevated left ventricular end-diastolic pressure.  2. Right ventricular systolic function is normal. The right ventricular size is normal. Tricuspid regurgitation signal is inadequate for assessing PA pressure.  3. The mitral valve is normal in structure. Trivial mitral valve regurgitation. No evidence of mitral stenosis.  4. The aortic valve is tricuspid. Aortic valve regurgitation is not visualized. Aortic valve sclerosis/calcification is present, without any evidence of aortic stenosis.  5. The inferior vena cava is normal in size with greater than 50% respiratory variability, suggesting right atrial pressure of 3 mmHg. Compared to prior study, LVF appears slightly improved. Cannot adequately assess the LV apex. Recommend repeat limited echo with definity contrast. FINDINGS  Left Ventricle: Left ventricular ejection fraction, by estimation, is 25 to 30%. The left ventricle has severely decreased function. The left  ventricle demonstrates regional wall motion abnormalities. The left ventricular internal cavity size was moderately dilated. There is no left ventricular hypertrophy. Left ventricular diastolic parameters are consistent with Grade I diastolic dysfunction (impaired relaxation). Elevated left ventricular end-diastolic pressure. Right Ventricle: The right ventricular size is normal. No increase in right ventricular wall thickness. Right ventricular systolic function is normal. Tricuspid regurgitation signal is inadequate for assessing PA pressure. Left Atrium: Left atrial size was normal in size. Right Atrium: Right atrial size was normal in size. Pericardium: There is no evidence of pericardial  with tip over the low SVC region. No pneumothorax. Normal heart size and pulmonary vascularity. Bilateral mid and lower lung infiltrates are unchanged since prior studies and likely represent chronic fibrosis, possibly post treatment changes. Superimposed focal pneumonia would not be excluded in the appropriate clinical setting. No pleural effusions. No pneumothorax. Residual prominence of the right hilum could represent residual lesion or post treatment changes. IMPRESSION: No change since previous study. Residual fibrosis or infiltration in the mid lungs. Possible post treatment changes in the right hilum. Electronically Signed   By: Burman Nieves M.D.   On: 11/03/2022 01:21   ECHOCARDIOGRAM LIMITED  Result Date: 11/01/2022    ECHOCARDIOGRAM LIMITED REPORT   Patient Name:   Benjamin Cook Date of Exam: 11/01/2022 Medical Rec #:  [id number]         Height:       70.0 in Accession #:    [id number]        Weight:       132.9 lb Date of Birth:  79/04/1945         BSA:          1.755 m Patient Age:    77 years          BP:           124/58 mmHg Patient Gender: M                 HR:           83 bpm. Exam Location:  Inpatient  Procedure: Limited Echo, Limited Color Doppler and Intracardiac Opacification            Agent Indications:    CHF-Acute Systolic I50.21  History:        Patient has prior history of Echocardiogram examinations, most                 recent 10/30/2022. CHF and Cardiomyopathy, CAD, COPD,                 Arrythmias:Tachycardia and Atrial Fibrillation,                 Signs/Symptoms:Dyspnea and Shortness of Breath; Risk                 Factors:Hypertension, Dyslipidemia and Current Smoker.  Sonographer:    [name] Referring Phys: [id number]  THOMAS O'NEAL IMPRESSIONS  1. Left ventricular ejection fraction, by estimation, is 20 to 25%. Left ventricular ejection fraction by PLAX is 16 %. The left ventricle has severely decreased function. The left ventricle demonstrates regional wall motion abnormalities (see scoring diagram/findings for description). The left ventricular internal cavity size was severely dilated.  2. Right ventricular systolic function is normal. The right ventricular size is normal.  3. The mitral valve is normal in structure. No evidence of mitral valve regurgitation. No evidence of mitral stenosis.  4. The aortic valve is normal in structure. There is mild calcification of the aortic valve. There is mild thickening of the aortic valve. Aortic valve regurgitation is not visualized. No aortic stenosis is present.  5. The inferior vena cava is normal in size with greater than 50% respiratory variability, suggesting right atrial pressure of 3 mmHg. FINDINGS  Left Ventricle: Left ventricular ejection fraction, by estimation, is 20 to 25%. Left ventricular ejection fraction by PLAX is 16 %. The left ventricle has severely decreased function. The left ventricle demonstrates regional wall motion abnormalities. Definity contrast agent was given IV to delineate the left  no pneumothorax or acute fracture. IMPRESSION: 1. Right mid lung infiltrate has decreased in density, but has mildly increased in size. 2. New airspace disease in the left mid lung. Electronically Signed   By: Darliss Cheney M.D.   On: 10/30/2022 19:20   ECHOCARDIOGRAM COMPLETE  Result Date: 10/30/2022    ECHOCARDIOGRAM REPORT   Patient Name:   Benjamin Cook Date of Exam: 10/30/2022 Medical Rec #:  [id number]         Height:       70.0 in Accession #:    [id number]        Weight:       127.6 lb Date of Birth:  28-Dec-1945         BSA:          1.725  m Patient Age:    77 years          BP:           135/63 mmHg Patient Gender: M                 HR:           84 bpm. Exam Location:  Inpatient Procedure: 2D Echo, Color Doppler and Cardiac Doppler Indications:    acute systolic chf  History:        Patient has prior history of Echocardiogram examinations, most                 recent 06/03/2022. Cardiomyopathy, Defibrillator, COPD and lung                 cancer, Arrythmias:Atrial Fibrillation; Risk                 Factors:Hypertension and Dyslipidemia.  Sonographer:    Delcie Roch RDCS Referring Phys: [id number] Jerilee Field SCHEVING IMPRESSIONS  1. Left ventricular ejection fraction, by estimation, is 25 to 30%. The left ventricle has severely decreased function. The left ventricle demonstrates regional wall motion abnormalities (see scoring diagram/findings for description). The left ventricular internal cavity size was moderately dilated. Left ventricular diastolic parameters are consistent with Grade I diastolic dysfunction (impaired relaxation). Elevated left ventricular end-diastolic pressure.  2. Right ventricular systolic function is normal. The right ventricular size is normal. Tricuspid regurgitation signal is inadequate for assessing PA pressure.  3. The mitral valve is normal in structure. Trivial mitral valve regurgitation. No evidence of mitral stenosis.  4. The aortic valve is tricuspid. Aortic valve regurgitation is not visualized. Aortic valve sclerosis/calcification is present, without any evidence of aortic stenosis.  5. The inferior vena cava is normal in size with greater than 50% respiratory variability, suggesting right atrial pressure of 3 mmHg. Compared to prior study, LVF appears slightly improved. Cannot adequately assess the LV apex. Recommend repeat limited echo with definity contrast. FINDINGS  Left Ventricle: Left ventricular ejection fraction, by estimation, is 25 to 30%. The left ventricle has severely decreased function. The left  ventricle demonstrates regional wall motion abnormalities. The left ventricular internal cavity size was moderately dilated. There is no left ventricular hypertrophy. Left ventricular diastolic parameters are consistent with Grade I diastolic dysfunction (impaired relaxation). Elevated left ventricular end-diastolic pressure. Right Ventricle: The right ventricular size is normal. No increase in right ventricular wall thickness. Right ventricular systolic function is normal. Tricuspid regurgitation signal is inadequate for assessing PA pressure. Left Atrium: Left atrial size was normal in size. Right Atrium: Right atrial size was normal in size. Pericardium: There is no evidence of pericardial  no pneumothorax or acute fracture. IMPRESSION: 1. Right mid lung infiltrate has decreased in density, but has mildly increased in size. 2. New airspace disease in the left mid lung. Electronically Signed   By: Darliss Cheney M.D.   On: 10/30/2022 19:20   ECHOCARDIOGRAM COMPLETE  Result Date: 10/30/2022    ECHOCARDIOGRAM REPORT   Patient Name:   Benjamin Cook Date of Exam: 10/30/2022 Medical Rec #:  [id number]         Height:       70.0 in Accession #:    [id number]        Weight:       127.6 lb Date of Birth:  28-Dec-1945         BSA:          1.725  m Patient Age:    77 years          BP:           135/63 mmHg Patient Gender: M                 HR:           84 bpm. Exam Location:  Inpatient Procedure: 2D Echo, Color Doppler and Cardiac Doppler Indications:    acute systolic chf  History:        Patient has prior history of Echocardiogram examinations, most                 recent 06/03/2022. Cardiomyopathy, Defibrillator, COPD and lung                 cancer, Arrythmias:Atrial Fibrillation; Risk                 Factors:Hypertension and Dyslipidemia.  Sonographer:    Delcie Roch RDCS Referring Phys: [id number] Jerilee Field SCHEVING IMPRESSIONS  1. Left ventricular ejection fraction, by estimation, is 25 to 30%. The left ventricle has severely decreased function. The left ventricle demonstrates regional wall motion abnormalities (see scoring diagram/findings for description). The left ventricular internal cavity size was moderately dilated. Left ventricular diastolic parameters are consistent with Grade I diastolic dysfunction (impaired relaxation). Elevated left ventricular end-diastolic pressure.  2. Right ventricular systolic function is normal. The right ventricular size is normal. Tricuspid regurgitation signal is inadequate for assessing PA pressure.  3. The mitral valve is normal in structure. Trivial mitral valve regurgitation. No evidence of mitral stenosis.  4. The aortic valve is tricuspid. Aortic valve regurgitation is not visualized. Aortic valve sclerosis/calcification is present, without any evidence of aortic stenosis.  5. The inferior vena cava is normal in size with greater than 50% respiratory variability, suggesting right atrial pressure of 3 mmHg. Compared to prior study, LVF appears slightly improved. Cannot adequately assess the LV apex. Recommend repeat limited echo with definity contrast. FINDINGS  Left Ventricle: Left ventricular ejection fraction, by estimation, is 25 to 30%. The left ventricle has severely decreased function. The left  ventricle demonstrates regional wall motion abnormalities. The left ventricular internal cavity size was moderately dilated. There is no left ventricular hypertrophy. Left ventricular diastolic parameters are consistent with Grade I diastolic dysfunction (impaired relaxation). Elevated left ventricular end-diastolic pressure. Right Ventricle: The right ventricular size is normal. No increase in right ventricular wall thickness. Right ventricular systolic function is normal. Tricuspid regurgitation signal is inadequate for assessing PA pressure. Left Atrium: Left atrial size was normal in size. Right Atrium: Right atrial size was normal in size. Pericardium: There is no evidence of pericardial  with tip over the low SVC region. No pneumothorax. Normal heart size and pulmonary vascularity. Bilateral mid and lower lung infiltrates are unchanged since prior studies and likely represent chronic fibrosis, possibly post treatment changes. Superimposed focal pneumonia would not be excluded in the appropriate clinical setting. No pleural effusions. No pneumothorax. Residual prominence of the right hilum could represent residual lesion or post treatment changes. IMPRESSION: No change since previous study. Residual fibrosis or infiltration in the mid lungs. Possible post treatment changes in the right hilum. Electronically Signed   By: Burman Nieves M.D.   On: 11/03/2022 01:21   ECHOCARDIOGRAM LIMITED  Result Date: 11/01/2022    ECHOCARDIOGRAM LIMITED REPORT   Patient Name:   Benjamin Cook Date of Exam: 11/01/2022 Medical Rec #:  [id number]         Height:       70.0 in Accession #:    [id number]        Weight:       132.9 lb Date of Birth:  79/04/1945         BSA:          1.755 m Patient Age:    77 years          BP:           124/58 mmHg Patient Gender: M                 HR:           83 bpm. Exam Location:  Inpatient  Procedure: Limited Echo, Limited Color Doppler and Intracardiac Opacification            Agent Indications:    CHF-Acute Systolic I50.21  History:        Patient has prior history of Echocardiogram examinations, most                 recent 10/30/2022. CHF and Cardiomyopathy, CAD, COPD,                 Arrythmias:Tachycardia and Atrial Fibrillation,                 Signs/Symptoms:Dyspnea and Shortness of Breath; Risk                 Factors:Hypertension, Dyslipidemia and Current Smoker.  Sonographer:    [name] Referring Phys: [id number]  THOMAS O'NEAL IMPRESSIONS  1. Left ventricular ejection fraction, by estimation, is 20 to 25%. Left ventricular ejection fraction by PLAX is 16 %. The left ventricle has severely decreased function. The left ventricle demonstrates regional wall motion abnormalities (see scoring diagram/findings for description). The left ventricular internal cavity size was severely dilated.  2. Right ventricular systolic function is normal. The right ventricular size is normal.  3. The mitral valve is normal in structure. No evidence of mitral valve regurgitation. No evidence of mitral stenosis.  4. The aortic valve is normal in structure. There is mild calcification of the aortic valve. There is mild thickening of the aortic valve. Aortic valve regurgitation is not visualized. No aortic stenosis is present.  5. The inferior vena cava is normal in size with greater than 50% respiratory variability, suggesting right atrial pressure of 3 mmHg. FINDINGS  Left Ventricle: Left ventricular ejection fraction, by estimation, is 20 to 25%. Left ventricular ejection fraction by PLAX is 16 %. The left ventricle has severely decreased function. The left ventricle demonstrates regional wall motion abnormalities. Definity contrast agent was given IV to delineate the left  with tip over the low SVC region. No pneumothorax. Normal heart size and pulmonary vascularity. Bilateral mid and lower lung infiltrates are unchanged since prior studies and likely represent chronic fibrosis, possibly post treatment changes. Superimposed focal pneumonia would not be excluded in the appropriate clinical setting. No pleural effusions. No pneumothorax. Residual prominence of the right hilum could represent residual lesion or post treatment changes. IMPRESSION: No change since previous study. Residual fibrosis or infiltration in the mid lungs. Possible post treatment changes in the right hilum. Electronically Signed   By: Burman Nieves M.D.   On: 11/03/2022 01:21   ECHOCARDIOGRAM LIMITED  Result Date: 11/01/2022    ECHOCARDIOGRAM LIMITED REPORT   Patient Name:   Benjamin Cook Date of Exam: 11/01/2022 Medical Rec #:  [id number]         Height:       70.0 in Accession #:    [id number]        Weight:       132.9 lb Date of Birth:  79/04/1945         BSA:          1.755 m Patient Age:    77 years          BP:           124/58 mmHg Patient Gender: M                 HR:           83 bpm. Exam Location:  Inpatient  Procedure: Limited Echo, Limited Color Doppler and Intracardiac Opacification            Agent Indications:    CHF-Acute Systolic I50.21  History:        Patient has prior history of Echocardiogram examinations, most                 recent 10/30/2022. CHF and Cardiomyopathy, CAD, COPD,                 Arrythmias:Tachycardia and Atrial Fibrillation,                 Signs/Symptoms:Dyspnea and Shortness of Breath; Risk                 Factors:Hypertension, Dyslipidemia and Current Smoker.  Sonographer:    [name] Referring Phys: [id number]  THOMAS O'NEAL IMPRESSIONS  1. Left ventricular ejection fraction, by estimation, is 20 to 25%. Left ventricular ejection fraction by PLAX is 16 %. The left ventricle has severely decreased function. The left ventricle demonstrates regional wall motion abnormalities (see scoring diagram/findings for description). The left ventricular internal cavity size was severely dilated.  2. Right ventricular systolic function is normal. The right ventricular size is normal.  3. The mitral valve is normal in structure. No evidence of mitral valve regurgitation. No evidence of mitral stenosis.  4. The aortic valve is normal in structure. There is mild calcification of the aortic valve. There is mild thickening of the aortic valve. Aortic valve regurgitation is not visualized. No aortic stenosis is present.  5. The inferior vena cava is normal in size with greater than 50% respiratory variability, suggesting right atrial pressure of 3 mmHg. FINDINGS  Left Ventricle: Left ventricular ejection fraction, by estimation, is 20 to 25%. Left ventricular ejection fraction by PLAX is 16 %. The left ventricle has severely decreased function. The left ventricle demonstrates regional wall motion abnormalities. Definity contrast agent was given IV to delineate the left

## 2022-11-26 NOTE — Assessment & Plan Note (Signed)
He had recent acute on chronic congestive heart failure and was hospitalized with medication changes On exam today, he appears to need continuous oxygen therapy but does not appear to be in heart failure Monitor closely

## 2022-11-26 NOTE — Assessment & Plan Note (Signed)
His last CT imaging show excellent response to therapy The hypermetabolic changes in his lungs consistent with radiation effect We will proceed with immunotherapy as scheduled I plan to repeat imaging study again at the end of the year

## 2022-11-26 NOTE — Assessment & Plan Note (Signed)
He has gained some weight since last time I saw him with improvement of his serum protein He has appointment to see dietitian today

## 2022-11-26 NOTE — Assessment & Plan Note (Signed)
His pancytopenia has improved dramatically since discontinuation of chemo and radiation Observe closely

## 2022-11-26 NOTE — Patient Instructions (Signed)
[facility]  Discharge Instructions: Thank you for choosing [facility] to provide your oncology and hematology care.   If you have a lab appointment with the Cancer Center, please go directly to the Cancer Center and check in at the registration area.   Wear comfortable clothing and clothing appropriate for easy access to any Portacath or PICC line.   We strive to give you quality time with your provider. You may need to reschedule your appointment if you arrive late (15 or more minutes).  Arriving late affects you and other patients whose appointments are after yours.  Also, if you miss three or more appointments without notifying the office, you may be dismissed from the clinic at the provider's discretion.      For prescription refill requests, have your pharmacy contact our office and allow 72 hours for refills to be completed.    Today you received the following chemotherapy and/or immunotherapy agent: Durvalumab (Imfinzi)    To help prevent nausea and vomiting after your treatment, we encourage you to take your nausea medication as directed.  BELOW ARE SYMPTOMS THAT SHOULD BE REPORTED IMMEDIATELY: *FEVER GREATER THAN 100.4 F (38 C) OR HIGHER *CHILLS OR SWEATING *NAUSEA AND VOMITING THAT IS NOT CONTROLLED WITH YOUR NAUSEA MEDICATION *UNUSUAL SHORTNESS OF BREATH *UNUSUAL BRUISING OR BLEEDING *URINARY PROBLEMS (pain or burning when urinating, or frequent urination) *BOWEL PROBLEMS (unusual diarrhea, constipation, pain near the anus) TENDERNESS IN MOUTH AND THROAT WITH OR WITHOUT PRESENCE OF ULCERS (sore throat, sores in mouth, or a toothache) UNUSUAL RASH, SWELLING OR PAIN  UNUSUAL VAGINAL DISCHARGE OR ITCHING   Items with * indicate a potential emergency and should be followed up as soon as possible or go to the Emergency Department if any problems should occur.  Please show the CHEMOTHERAPY ALERT CARD or IMMUNOTHERAPY ALERT CARD  at check-in to the Emergency Department and triage nurse.  Should you have questions after your visit or need to cancel or reschedule your appointment, please contact [facility]  Dept: [phone]  and follow the prompts.  Office hours are 8:00 a.m. to 4:30 p.m. Monday - Friday. Please note that voicemails left after 4:00 p.m. may not be returned until the following business day.  We are closed weekends and major holidays. You have access to a nurse at all times for urgent questions. Please call the main number to the clinic Dept: [phone] and follow the prompts.   For any non-urgent questions, you may also contact your provider using MyChart. We now offer e-Visits for anyone 18 and older to request care online for non-urgent symptoms. For details visit [email or web address].   Also download the MyChart app! Go to the app store, search "MyChart", open the app, select [facility], and log in with your MyChart username and password.  Durvalumab Injection What is this medication? DURVALUMAB (dur VAL ue mab) treats some types of cancer. It works by helping your immune system slow or stop the spread of cancer cells. It is a monoclonal antibody. This medicine may be used for other purposes; ask your health care provider or pharmacist if you have questions. COMMON BRAND NAME(S): IMFINZI What should I tell my care team before I take this medication? They need to know if you have any of these conditions: Allogeneic stem cell transplant (uses someone else's stem cells) Autoimmune diseases, such as Crohn disease, ulcerative colitis, lupus History of chest radiation Nervous system problems,   such as Guillain-Barre syndrome, myasthenia gravis Organ transplant An unusual or allergic reaction to durvalumab, other medications, foods, dyes, or preservatives Pregnant or trying to get pregnant Breast-feeding How should I use this medication? This medication is  infused into a vein. It is given by your care team in a hospital or clinic setting. A special MedGuide will be given to you before each treatment. Be sure to read this information carefully each time. Talk to your care team about the use of this medication in children. Special care may be needed. Overdosage: If you think you have taken too much of this medicine contact a poison control center or emergency room at once. NOTE: This medicine is only for you. Do not share this medicine with others. What if I miss a dose? Keep appointments for follow-up doses. It is important not to miss your dose. Call your care team if you are unable to keep an appointment. What may interact with this medication? Interactions have not been studied. This list may not describe all possible interactions. Give your health care provider a list of all the medicines, herbs, non-prescription drugs, or dietary supplements you use. Also tell them if you smoke, drink alcohol, or use illegal drugs. Some items may interact with your medicine. What should I watch for while using this medication? Your condition will be monitored carefully while you are receiving this medication. You may need blood work while taking this medication. This medication may cause serious skin reactions. They can happen weeks to months after starting the medication. Contact your care team right away if you notice fevers or flu-like symptoms with a rash. The rash may be red or purple and then turn into blisters or peeling of the skin. You may also notice a red rash with swelling of the face, lips, or lymph nodes in your neck or under your arms. Tell your care team right away if you have any change in your eyesight. Talk to your care team if you may be pregnant. Serious birth defects can occur if you take this medication during pregnancy and for 3 months after the last dose. You will need a negative pregnancy test before starting this medication. Contraception  is recommended while taking this medication and for 3 months after the last dose. Your care team can help you find the option that works for you. Do not breastfeed while taking this medication and for 3 months after the last dose. What side effects may I notice from receiving this medication? Side effects that you should report to your care team as soon as possible: Allergic reactions--skin rash, itching, hives, swelling of the face, lips, tongue, or throat Dry cough, shortness of breath or trouble breathing Eye pain, redness, irritation, or discharge with blurry or decreased vision Heart muscle inflammation--unusual weakness or fatigue, shortness of breath, chest pain, fast or irregular heartbeat, dizziness, swelling of the ankles, feet, or hands Hormone gland problems--headache, sensitivity to light, unusual weakness or fatigue, dizziness, fast or irregular heartbeat, increased sensitivity to cold or heat, excessive sweating, constipation, hair loss, increased thirst or amount of urine, tremors or shaking, irritability Infusion reactions--chest pain, shortness of breath or trouble breathing, feeling faint or lightheaded Kidney injury (glomerulonephritis)--decrease in the amount of urine, red or dark brown urine, foamy or bubbly urine, swelling of the ankles, hands, or feet Liver injury--right upper belly pain, loss of appetite, nausea, light-colored stool, dark yellow or brown urine, yellowing skin or eyes, unusual weakness or fatigue Pain, tingling, or numbness in   the hands or feet, muscle weakness, change in vision, confusion or trouble speaking, loss of balance or coordination, trouble walking, seizures Rash, fever, and swollen lymph nodes Redness, blistering, peeling, or loosening of the skin, including inside the mouth Sudden or severe stomach pain, bloody diarrhea, fever, nausea, vomiting Side effects that usually do not require medical attention (report these to your care team if they  continue or are bothersome): Bone, joint, or muscle pain Diarrhea Fatigue Loss of appetite Nausea Skin rash This list may not describe all possible side effects. Call your doctor for medical advice about side effects. You may report side effects to FDA at 1-800-FDA-1088. Where should I keep my medication? This medication is given in a hospital or clinic. It will not be stored at home. NOTE: This sheet is a summary. It may not cover all possible information. If you have questions about this medicine, talk to your doctor, pharmacist, or health care provider.  2024 Elsevier/Gold Standard (2021-06-05 00:00:00)   

## 2022-11-26 NOTE — Progress Notes (Signed)
Nutrition Follow-up:  Patient with NSCLC. He is receiving immunotherapy with durvalumab q28d.   9/24-10/2 admission with acute hypoxic respiratory failure; d/c with supplemental O2, HH PT/OT  Met with patient and wife in infusion. Patient sleeping soundly at visit. Recent history provided by wife. She reports appetite has improved and eating really well at home. Patient is working with PT/OT twice weekly. Wife states that patient is getting stronger. He is able to go without supplemental oxygen for short amounts of time. His goal is to be able to go without it for 1-2 hours so that he can get back to attending church. Wife reports patient had follow-up with cardiologist yesterday (10/21). He has restarted furosemide. Patient is weighing daily.    Medications: reviewed   Labs: albumin 3.3 (improved)  Anthropometrics: Wt 140 lb 3.2 oz today   10/9 - 142 lb 3.2 oz  NUTRITION DIAGNOSIS: Unintentional wt loss - stable   INTERVENTION:  Continue including good sources of protein at every meal Suggested high protein snack following PT Educated on potassium rich foods, recommend including good sources daily given lasix BID Continue daily AM weights     MONITORING, EVALUATION, GOAL: wt trends, intake   NEXT VISIT: To be scheduled as needed with treatment

## 2022-11-27 LAB — T4: T4, Total: 11.9 ug/dL (ref 4.5–12.0)

## 2022-12-02 ENCOUNTER — Ambulatory Visit (HOSPITAL_COMMUNITY)
Admission: RE | Admit: 2022-12-02 | Discharge: 2022-12-02 | Disposition: A | Discharge status: Home / Self Care | Payer: Medicare HMO | Source: Ambulatory Visit | Attending: Vascular Surgery | Admitting: Vascular Surgery

## 2022-12-02 ENCOUNTER — Encounter (HOSPITAL_COMMUNITY): Payer: Self-pay

## 2022-12-02 DIAGNOSIS — I723 Aneurysm of iliac artery: Secondary | ICD-10-CM | POA: Insufficient documentation

## 2022-12-02 HISTORY — DX: Malignant (primary) neoplasm, unspecified: C80.1

## 2022-12-02 MED ORDER — HEPARIN SOD (PORK) LOCK FLUSH 100 UNIT/ML IV SOLN
INTRAVENOUS | Status: AC
  Filled 2022-12-02: qty 5

## 2022-12-02 MED ORDER — IOHEXOL 350 MG/ML SOLN
100.0000 mL | Freq: Once | INTRAVENOUS | Status: AC | PRN
  Administered 2022-12-02: 100 mL via INTRAVENOUS

## 2022-12-02 MED ORDER — SODIUM CHLORIDE (PF) 0.9 % IJ SOLN
INTRAMUSCULAR | Status: AC
  Filled 2022-12-02: qty 100

## 2022-12-05 ENCOUNTER — Other Ambulatory Visit: Payer: Self-pay | Admitting: Cardiology

## 2022-12-05 DIAGNOSIS — I480 Paroxysmal atrial fibrillation: Secondary | ICD-10-CM

## 2022-12-05 NOTE — Telephone Encounter (Signed)
Xarelto 20mg  refill request received. Pt is 77 years old, weight-63.6kg, Crea-1.08 on 11/26/22, last seen by Rise Paganini NP on 11/25/22, Diagnosis-Afib, CrCl-49.47ml/min; Dose is inappropriate based on dosing criteria.  Pt has been on xarelto 15mg  daily will deny 20mg  xarelto as the 15mg  dose was sent on 11/13/22

## 2022-12-11 LAB — LAB REPORT - SCANNED: EGFR: 63

## 2022-12-14 ENCOUNTER — Other Ambulatory Visit: Payer: Self-pay | Admitting: Cardiology

## 2022-12-24 ENCOUNTER — Inpatient Hospital Stay: Payer: Medicare HMO | Attending: Hematology and Oncology

## 2022-12-24 ENCOUNTER — Inpatient Hospital Stay: Payer: Medicare HMO

## 2022-12-24 ENCOUNTER — Encounter: Payer: Self-pay | Admitting: Hematology and Oncology

## 2022-12-24 ENCOUNTER — Inpatient Hospital Stay (HOSPITAL_BASED_OUTPATIENT_CLINIC_OR_DEPARTMENT_OTHER): Payer: Medicare HMO | Admitting: Hematology and Oncology

## 2022-12-24 VITALS — BP 129/71 | HR 70 | Resp 18

## 2022-12-24 VITALS — BP 126/60 | HR 74 | Temp 97.8°F | Resp 18 | Ht 70.0 in | Wt 143.4 lb

## 2022-12-24 DIAGNOSIS — I5042 Chronic combined systolic (congestive) and diastolic (congestive) heart failure: Secondary | ICD-10-CM

## 2022-12-24 DIAGNOSIS — C3491 Malignant neoplasm of unspecified part of right bronchus or lung: Secondary | ICD-10-CM

## 2022-12-24 DIAGNOSIS — I714 Abdominal aortic aneurysm, without rupture, unspecified: Secondary | ICD-10-CM | POA: Diagnosis not present

## 2022-12-24 DIAGNOSIS — D61818 Other pancytopenia: Secondary | ICD-10-CM | POA: Diagnosis not present

## 2022-12-24 DIAGNOSIS — Z7901 Long term (current) use of anticoagulants: Secondary | ICD-10-CM | POA: Diagnosis not present

## 2022-12-24 DIAGNOSIS — C3490 Malignant neoplasm of unspecified part of unspecified bronchus or lung: Secondary | ICD-10-CM | POA: Insufficient documentation

## 2022-12-24 DIAGNOSIS — Z7951 Long term (current) use of inhaled steroids: Secondary | ICD-10-CM | POA: Diagnosis not present

## 2022-12-24 DIAGNOSIS — Z5112 Encounter for antineoplastic immunotherapy: Secondary | ICD-10-CM | POA: Insufficient documentation

## 2022-12-24 DIAGNOSIS — Z79899 Other long term (current) drug therapy: Secondary | ICD-10-CM | POA: Insufficient documentation

## 2022-12-24 LAB — CMP (CANCER CENTER ONLY)
ALT: 20 U/L (ref 0–44)
AST: 26 U/L (ref 15–41)
Albumin: 3.8 g/dL (ref 3.5–5.0)
Alkaline Phosphatase: 85 U/L (ref 38–126)
Anion gap: 5 (ref 5–15)
BUN: 12 mg/dL (ref 8–23)
CO2: 29 mmol/L (ref 22–32)
Calcium: 8.9 mg/dL (ref 8.9–10.3)
Chloride: 104 mmol/L (ref 98–111)
Creatinine: 1.01 mg/dL (ref 0.61–1.24)
GFR, Estimated: >60 mL/min (ref >60)
Glucose, Bld: 96 mg/dL (ref 70–99)
Potassium: 3.7 mmol/L (ref 3.5–5.1)
Sodium: 138 mmol/L (ref 135–145)
Total Bilirubin: 0.6 mg/dL (ref <1.2)
Total Protein: 6.9 g/dL (ref 6.5–8.1)

## 2022-12-24 LAB — CBC WITH DIFFERENTIAL (CANCER CENTER ONLY)
Abs Immature Granulocytes: 0.05 10*3/uL (ref 0.00–0.07)
Basophils Absolute: 0 10*3/uL (ref 0.0–0.1)
Basophils Relative: 1 %
Eosinophils Absolute: 0.2 10*3/uL (ref 0.0–0.5)
Eosinophils Relative: 3 %
HCT: 40.1 % (ref 39.0–52.0)
Hemoglobin: 13.2 g/dL (ref 13.0–17.0)
Immature Granulocytes: 1 %
Lymphocytes Relative: 13 %
Lymphs Abs: 1.1 10*3/uL (ref 0.7–4.0)
MCH: 31.9 pg (ref 26.0–34.0)
MCHC: 32.9 g/dL (ref 30.0–36.0)
MCV: 96.9 fL (ref 80.0–100.0)
Monocytes Absolute: 1.1 10*3/uL — ABNORMAL HIGH (ref 0.1–1.0)
Monocytes Relative: 13 %
Neutro Abs: 6 10*3/uL (ref 1.7–7.7)
Neutrophils Relative %: 69 %
Platelet Count: 162 10*3/uL (ref 150–400)
RBC: 4.14 MIL/uL — ABNORMAL LOW (ref 4.22–5.81)
RDW: 15.9 % — ABNORMAL HIGH (ref 11.5–15.5)
WBC Count: 8.5 10*3/uL (ref 4.0–10.5)
nRBC: 0 % (ref 0.0–0.2)

## 2022-12-24 MED ORDER — SODIUM CHLORIDE 0.9 % IV SOLN
1500.0000 mg | Freq: Once | INTRAVENOUS | Status: AC
  Administered 2022-12-24: 1500 mg via INTRAVENOUS
  Filled 2022-12-24: qty 30

## 2022-12-24 MED ORDER — LIDOCAINE-PRILOCAINE 2.5-2.5 % EX CREA: 1.0000 | TOPICAL_CREAM | CUTANEOUS | 3 refills | Status: DC | PRN

## 2022-12-24 MED ORDER — SODIUM CHLORIDE 0.9% FLUSH
10.0000 mL | INTRAVENOUS | Status: DC | PRN
  Administered 2022-12-24: 10 mL

## 2022-12-24 MED ORDER — SODIUM CHLORIDE 0.9% FLUSH
10.0000 mL | Freq: Once | INTRAVENOUS | Status: AC
  Administered 2022-12-24: 10 mL

## 2022-12-24 MED ORDER — HEPARIN SOD (PORK) LOCK FLUSH 100 UNIT/ML IV SOLN
500.0000 [IU] | Freq: Once | INTRAVENOUS | Status: AC | PRN
  Administered 2022-12-24: 500 [IU]

## 2022-12-24 MED ORDER — SODIUM CHLORIDE 0.9 % IV SOLN: Freq: Once | INTRAVENOUS | Status: AC

## 2022-12-24 NOTE — Assessment & Plan Note (Signed)
Recent CT imaging showed stability

## 2022-12-24 NOTE — Assessment & Plan Note (Signed)
This has resolved. Monitor closely.

## 2022-12-24 NOTE — Assessment & Plan Note (Signed)
His last CT imaging showed resolution of abnormal lung changes We will proceed with immunotherapy as scheduled I plan to repeat imaging study again in January

## 2022-12-24 NOTE — Progress Notes (Signed)
[name] OFFICE PROGRESS NOTE  Patient Care Team: [name], [name], [name], [name]  ASSESSMENT & PLAN:  Non-small cell lung cancer [facility]) His last CT imaging showed resolution of abnormal lung changes We will proceed with immunotherapy as scheduled I plan to repeat imaging study again in January  Pancytopenia, acquired [facility]) This has resolved Monitor closely  Chronic combined systolic and diastolic heart failure ([name]) His BP is good and he has no signs or symptoms of CHF Continue risk factor management  AAA (abdominal aortic aneurysm) ([name]) Recent CT imaging showed stability  No orders of the defined types were placed in this encounter.   All questions were answered. The patient knows to call the clinic with any problems, questions or concerns. The total time spent in the appointment was 30 minutes encounter with patients including review of chart and various tests results, discussions about plan of care and coordination of care plan   Artis Delay, MD 12/24/2022 11:36 AM  INTERVAL HISTORY: Please see below for problem oriented charting. he returns for surveillance follow-up and treatment today He felt better He has gained some weight and eating better However, he needs continuous oxygen therapy Denies recent cough No recent infection  REVIEW OF SYSTEMS:   Constitutional: Denies fevers, chills or abnormal weight loss Eyes: Denies blurriness of vision Ears, nose, mouth, throat, and face: Denies mucositis or sore throat Respiratory: Denies cough, dyspnea or wheezes Cardiovascular: Denies palpitation, chest discomfort or lower extremity swelling Gastrointestinal:  Denies nausea, heartburn or change in bowel habits Skin: Denies abnormal skin rashes Lymphatics: Denies new lymphadenopathy or easy  bruising Neurological:Denies numbness, tingling or new weaknesses Behavioral/Psych: Mood is stable, no new changes  All other systems were reviewed with the patient and are negative.  I have reviewed the past medical history, past surgical history, social history and family history with the patient and they are unchanged from previous note.  ALLERGIES:  is allergic to paclitaxel.  MEDICATIONS:  Current Outpatient Medications  Medication Sig Dispense Refill   lidocaine-prilocaine (EMLA) cream Apply 1 Application topically as needed. 30 g 3   acetaminophen (TYLENOL) 500 MG tablet Take 500-1,000 mg by mouth every 8 (eight) hours as needed (for pain).      amiodarone (PACERONE) 400 MG tablet Take 0.5 tablets (200 mg total) by mouth 2 (two) times daily. 90 tablet 2   atorvastatin (LIPITOR) 80 MG tablet TAKE 1 TABLET BY MOUTH DAILY AT 6 PM 90 tablet 3   Chlorpheniramine Maleate (ALLERGY RELIEF PO) Take 1 tablet by mouth daily as needed (allergies).     empagliflozin (JARDIANCE) 10 MG TABS tablet Take 1 tablet (10 mg total) by mouth daily before breakfast. 90 tablet 2   furosemide (LASIX) 40 MG tablet Take 0.5 tablets (20 mg total) by mouth daily. 45 tablet 2   gabapentin (NEURONTIN) 100 MG capsule Take 1 capsule (100 mg total) by mouth at bedtime. 30 capsule 0   losartan (COZAAR) 25 MG tablet Take 0.5 tablets (12.5 mg total) by mouth daily. (Patient not taking: Reported on 11/25/2022) 45 tablet 3   melatonin 5 MG TABS Take 1 tablet (5 mg total) by mouth at bedtime. 30 tablet 0   metoprolol succinate (TOPROL-XL) 25 MG 24 hr tablet Take 0.5 tablets (12.5 mg total) by mouth 2 (two) times daily. 90 tablet 2   mometasone-formoterol (DULERA)  100-5 MCG/ACT AERO Inhale 2 puffs into the lungs in the morning and at bedtime. 8.8 each 1   Multiple Vitamin (MULTIVITAMIN) capsule Take 1 capsule by mouth daily.     ondansetron (ZOFRAN) 8 MG tablet Take 1 tablet (8 mg total) by mouth every 8 (eight) hours as  needed for nausea. 30 tablet 3   pantoprazole (PROTONIX) 40 MG tablet Take 40 mg by mouth daily.     Rivaroxaban (XARELTO) 15 MG TABS tablet Take 1 tablet (15 mg total) by mouth daily with supper. 30 tablet 5   senna-docusate (SENOKOT-S) 8.6-50 MG tablet Take 2 tablets by mouth daily. 60 tablet 1   spironolactone (ALDACTONE) 25 MG tablet Take 0.5 tablets (12.5 mg total) by mouth daily. 45 tablet 2   No current facility-administered medications for this visit.   Facility-Administered Medications Ordered in Other Visits  Medication Dose Route Frequency Provider Last Rate Last Admin   heparin lock flush 100 unit/mL  500 Units Intracatheter Once PRN Bertis Ruddy, Arlan Birks, MD       sodium chloride flush (NS) 0.9 % injection 10 mL  10 mL Intracatheter PRN Artis Delay, MD        SUMMARY OF ONCOLOGIC HISTORY: Oncology History Overview Note  Foundation One test showed no actionable mutations. 6TMB, MSI low   Non-small cell lung cancer ([name])  06/11/2022 Initial Diagnosis   Non-small cell lung cancer ([name])   06/20/2022 Pathology Results   CYTOLOGY - NON PAP  CASE: [id number]  PATIENT: Benjamin Cook  Non-Gynecological Cytology Report   Clinical History: Right hilar mass  Specimen Submitted:  A. LUNG, RIGHT HILAR MASS, FINE NEEDLE ASPIRATION:    FINAL MICROSCOPIC DIAGNOSIS:  - Malignant cells consistent with non-small cell carcinoma  - See comment   SPECIMEN ADEQUACY:  Satisfactory for evaluation   DIAGNOSTIC COMMENTS:  The specimen contains atypical cells with malignant appearance characterized by nuclear enlargement, pleomorphism, clumped chromatin and occasional nucleoli. Immunohistochemical stains were attempted however no cells were present on the stains. The morphology does not support a small cell carcinoma with differential diagnosis including adenocarcinoma, large cell neuroendocrine carcinoma, or large cell carcinoma, et al. More diagnostic material is needed for further classification  and molecular tests.    07/02/2022 PET scan   1. Right suprahilar hypermetabolic mass, consistent with primary lung malignancy. 2. Subcarinal hypermetabolic activity which is contiguous with right hilar mass, possibly due to mediastinal extension or subcarinal lymphadenopathy. 3. No evidence of metastatic disease in the abdomen or pelvis. 4. Right adrenal gland mass with low-level FDG uptake, unchanged when compared with prior exams dating back to 2018 and consistent with benign adenoma. 5. Hypermetabolic right paraspinal consolidation, likely infectious or inflammatory. 6. Aortic Atherosclerosis (ICD10-I70.0) and Emphysema (ICD10-J43.9).     07/02/2022 Cancer Staging   Staging form: Lung, AJCC 8th Edition - Clinical stage from 07/02/2022: Stage IIIA (cT2, cN2, cM0) - Signed by Artis Delay, MD on 07/02/2022 Stage prefix: Initial diagnosis   07/09/2022 Procedure   Successful placement of a right internal jugular approach power injectable Port-A-Cath. The catheter is ready for immediate use.     07/15/2022 - 08/12/2022 Chemotherapy   Patient is on Treatment Plan : LUNG Carboplatin + Paclitaxel + XRT q7d     10/01/2022 -  Chemotherapy   Patient is on Treatment Plan : LUNG NSCLC Durvalumab (1500) q28d     10/01/2022 PET scan   NM PET Image Restage (PS) Skull Base to Thigh (F-18 FDG)  Result Date: 09/30/2022 CLINICAL DATA:  Subsequent treatment strategy for non-small cell lung cancer. EXAM: NUCLEAR MEDICINE PET SKULL BASE TO THIGH TECHNIQUE: 6.33 mCi F-18 FDG was injected intravenously. Full-ring PET imaging was performed from the skull base to thigh after the radiotracer. CT data was obtained and used for attenuation correction and anatomic localization. Fasting blood glucose: 89 mg/dl COMPARISON:  PET-CT 11/91/4782.  Chest CT 06/03/2022. FINDINGS: Mediastinal blood pool activity: SUV max 2.1 NECK: No hypermetabolic cervical lymph nodes are identified.Asymmetric activity within the left pterygoid  musculature, likely physiologic. No suspicious activity identified within the pharyngeal mucosal space. Incidental CT findings: Prominent bilateral carotid atherosclerosis. CHEST: There is no residual hypermetabolic activity within the previously demonstrated right suprahilar mass which has significantly decreased in size in the interval. This currently measures approximately 2.5 x 2.0 cm on image 32/7 and has an SUV max of 3.5 (previously 23.4). Similar 1.2 cm short axis subcarinal node on image 71/4 within SUV max of 3.5 (previously 2.6). No other hypermetabolic mediastinal, hilar or axillary lymph nodes. There are new extensive bandlike opacities throughout the right lung with a perihilar distribution. These demonstrate hypermetabolic activity with an SUV max of 11.5 posteriorly. Based on distribution, these findings are most likely secondary to radiation pneumonitis. No abnormal metabolic activity or suspicious nodularity in the left lung. Incidental CT findings: Moderate to severe centrilobular emphysema with diffuse central airway thickening and scattered pulmonary scarring. Atherosclerosis of the aorta, great vessels and coronary arteries. Right IJ Port-A-Cath extends to the superior cavoatrial junction. Left subclavian pacemaker leads appear unchanged. ABDOMEN/PELVIS: There is no hypermetabolic activity within the liver, left adrenal gland, spleen or pancreas. A right adrenal nodule with low level metabolic activity is unchanged. This measures 4.2 x 3.1 cm on image 107/4 and has an SUV max of 3.2. There is no hypermetabolic nodal activity in the abdomen or pelvis. Incidental CT findings: Diffuse aortic and branch vessel atherosclerosis status post aortoiliac endograft stenting. The excluded aneurysm measures approximately 4.5 cm in AP diameter, unchanged. Stable bilateral renal cysts for which no follow-up imaging is recommended. Stable marked enlargement of the prostate gland without hypermetabolic  activity. SKELETON: There is no hypermetabolic activity to suggest osseous metastatic disease. Incidental CT findings: Multilevel spondylosis. IMPRESSION: 1. Significant interval response to therapy. The previously demonstrated right suprahilar mass has significantly decreased in size and is no longer hypermetabolic. 2. New hypermetabolic bandlike opacities throughout the right lung with a perihilar distribution, most consistent with radiation pneumonitis. Infection considered less likely. Attention on follow-up recommended. 3. No definite signs of metastatic disease. A mildly hypermetabolic subcarinal lymph node is similar to the prior study and likely reactive. 4. Stable right adrenal nodule with low level metabolic activity, likely an adenoma based on long-term stability from previous CTs dating back to 10/14/2003. 5. Aortic Atherosclerosis (ICD10-I70.0) and Emphysema (ICD10-J43.9). Electronically Signed   By: Carey Bullocks M.D.   On: 09/30/2022 15:09   CUP PACEART REMOTE DEVICE CHECK  Result Date: 09/29/2022 Scheduled remote reviewed. Normal device function.  The device estimates 10.4 months until ERI There was one atrial arrhythmia detected that was a short burst of noise There was one 30 second NSVT that 122 bpm and appear to be ST and not VT Next remote 91 days. Hassell Halim, RN, CCDS, [employer]       PHYSICAL EXAMINATION: ECOG PERFORMANCE STATUS: 2 - Symptomatic, <50% confined to bed  Vitals:   12/24/22 0929  BP: 126/60  Pulse: 74  Resp: 18  Temp: 97.8 F (36.6 C)  SpO2: 98%   Filed Weights   12/24/22 0929  Weight: 143 lb 6.4 oz (65 kg)    GENERAL:alert, no distress and comfortable. He has oxygen delivered via nasal cannula NEURO: alert & oriented x 3 with fluent speech, no focal motor/sensory deficits  LABORATORY DATA:  I have reviewed the data as listed    Component Value Date/Time   NA 138 12/24/2022 0909   NA 138 04/15/2022 0913   K 3.7 12/24/2022 0909    CL 104 12/24/2022 0909   CO2 29 12/24/2022 0909   GLUCOSE 96 12/24/2022 0909   BUN 12 12/24/2022 0909   BUN 7 (L) 04/15/2022 0913   CREATININE 1.01 12/24/2022 0909   CREATININE 0.88 09/15/2015 0959   CALCIUM 8.9 12/24/2022 0909   PROT 6.9 12/24/2022 0909   PROT 6.8 11/07/2020 1303   ALBUMIN 3.8 12/24/2022 0909   ALBUMIN 3.6 (L) 11/07/2020 1303   AST 26 12/24/2022 0909   ALT 20 12/24/2022 0909   ALKPHOS 85 12/24/2022 0909   BILITOT 0.6 12/24/2022 0909   GFRNONAA >60 12/24/2022 0909   GFRAA 96 03/17/2020 1016    No results found for: "SPEP", "UPEP"  Lab Results  Component Value Date   WBC 8.5 12/24/2022   NEUTROABS 6.0 12/24/2022   HGB 13.2 12/24/2022   HCT 40.1 12/24/2022   MCV 96.9 12/24/2022   PLT 162 12/24/2022      Chemistry      Component Value Date/Time   NA 138 12/24/2022 0909   NA 138 04/15/2022 0913   K 3.7 12/24/2022 0909   CL 104 12/24/2022 0909   CO2 29 12/24/2022 0909   BUN 12 12/24/2022 0909   BUN 7 (L) 04/15/2022 0913   CREATININE 1.01 12/24/2022 0909   CREATININE 0.88 09/15/2015 0959      Component Value Date/Time   CALCIUM 8.9 12/24/2022 0909   ALKPHOS 85 12/24/2022 0909   AST 26 12/24/2022 0909   ALT 20 12/24/2022 0909   BILITOT 0.6 12/24/2022 0909       RADIOGRAPHIC STUDIES:I have reviewed imaging with him and his wife I have personally reviewed the radiological images as listed and agreed with the findings in the report. CT ANGIO AO+BIFEM W & OR WO CONTRAST  Result Date: 12/14/2022 CLINICAL DATA:  Aortic aneurysm follow-up EXAM: CT ANGIOGRAPHY OF ABDOMINAL AORTA WITH ILIOFEMORAL RUNOFF TECHNIQUE: Multidetector CT imaging of the abdomen, pelvis and lower extremities was performed using the standard protocol during bolus administration of intravenous contrast. Multiplanar CT image reconstructions and MIPs were obtained to evaluate the vascular anatomy. RADIATION DOSE REDUCTION: This exam was performed according to the departmental  dose-optimization program which includes automated exposure control, adjustment of the mA and/or kV according to patient size and/or use of iterative reconstruction technique. CONTRAST:  OMNIPAQUE IOHEXOL 350 MG/ML SOLN COMPARISON:  Prior PET-CT 06/29/2022; prior CT scan of the abdomen and pelvis 10/14/2020 FINDINGS: VASCULAR Aorta: Prior postsurgical changes of endovascular aortic repair of a fusiform infrarenal abdominal aortic aneurysm. The bifurcated endoprosthesis extends from just below the renal arteries into the common iliac arteries bilaterally. No visible endoleak on arterial or delayed phase imaging. The excluded aneurysm sac measures 4.9 x 4.9 cm. This is essentially unchanged compared to 4.8 cm measured in September of 2022. Focal non flow limiting dissection secondary to penetrating atherosclerotic ulceration at the transition from the descending thoracic aorta into the visceral abdominal aorta. Celiac: Critical stenosis at the origin of the celiac artery with mild poststenotic dilation. SMA:  Calcified atherosclerotic plaque results in mild stenosis of the origin of the SMA. Renals: Solitary renal arteries bilaterally. Calcified plaque results in moderate stenosis on the right and mild/minimal stenosis on the left. IMA: Occluded at the origin. RIGHT Lower Extremity Inflow: Stable aneurysmal dilation of the right internal iliac artery measuring up to 3.3 cm. Calcified plaque at the origin of the external iliac artery results in at least mild stenosis. Outflow: The common femoral and profunda femoral artery are both patent. Limited evaluation of the SFA due to delayed transit of contrast. Suspect chronic occlusion of the SFA given asymmetric decreased contrast enhancement compared to the left. Evaluation of the popliteal artery is limited. No popliteal aneurysm. Runoff: No contrast within the runoff arteries. Extensive scattered calcified atherosclerotic plaque. LEFT Lower Extremity Inflow: Buildup  of wall adherent thrombus along the left iliac limb. Aneurysmal dilation of the left common iliac artery to 2.2 cm is unchanged compared to prior. No internal iliac artery aneurysm. The external iliac artery remains patent. Outflow: Patent common femoral, profunda femoral and superficial femoral artery with extensive scattered atherosclerotic plaque. Non opacification of popliteal artery due to delayed transit of contrast material. No popliteal aneurysm. Runoff: No contrast within the runoff arteries. Extensive scattered atherosclerotic vascular calcifications. Veins: No focal venous abnormality. Review of the MIP images confirms the above findings. NON-VASCULAR Lower chest: Significant left ventricular dilation. Inferior ventricular aneurysm suggest prior myocardial infarction. Partially imaged cardiac rhythm maintenance device. Small hiatal hernia. Partially imaged centrilobular pulmonary emphysema and diffuse mild bronchial wall thickening. No large effusion. Hepatobiliary: No focal liver abnormality is seen. No gallstones, gallbladder wall thickening, or biliary dilatation. Pancreas: Unremarkable. No pancreatic ductal dilatation or surrounding inflammatory changes. Spleen: Normal in size without focal abnormality. Adrenals/Urinary Tract: Stable 3 cm right adrenal adenoma without interval change compared to prior imaging from September of 2022. The left adrenal gland is normal. No hydronephrosis, nephrolithiasis or enhancing renal mass. Stable circumscribed water attenuation lesions in the right kidney consistent with simple cysts. No imaging follow-up is recommended. Large lobular enhancing mass indents the base of the bladder. This appears to represent significant hypertrophy of the median lobe of the prostate gland. The imaging appearance is similar compared to prior imaging. No evidence of hypermetabolic activity on prior PET-CT. Stomach/Bowel: No evidence of bowel obstruction or focal bowel wall thickening.  Normal appendix in the right lower quadrant. Lymphatic: No suspicious lymphadenopathy. Reproductive: Marked prostatomegaly with significant hypertrophy of the median lobe extending into the bladder. Other: Bilateral fat containing inguinal hernias. The anterior aspect of the bladder is beginning to extend into the inguinal hernia on the left. Musculoskeletal: No acute fracture or aggressive appearing lytic or blastic osseous lesion. Multilevel degenerative disc disease. IMPRESSION: VASCULAR 1. Abdominal aortic aneurysm status successful post endovascular aortic repair without evidence of endoleak. The excluded aneurysm sac is stable at 4.9 cm. 2. Stable aneurysmal dilation of the right internal iliac artery at 3.3 cm. 3. Very limited evaluation of the outflow and runoff vessels bilaterally due to delayed transit of contrast material. This is likely due to poor cardiac output. 4. Within these limitations, suspect chronic occlusion of the right superficial femoral artery. 5. Extensive medial arteriosclerosis in the runoff (below the knee) arteries bilaterally. 6. Critical stenosis of the origin of the celiac artery. 7. Mild stenosis of the origin of the SMA. 8. Moderate stenosis of the origin of the right renal artery. NON-VASCULAR 1. Marked prostatomegaly with significant hyperplasia of the median lobe which indents into the bladder  base. Findings are similar compared to prior imaging. 2. Significant left ventricular dilatation with aneurysm formation along the inferior and inferoseptal walls consistent with sequelae of prior myocardial infarction. 3. Centrilobular pulmonary emphysema. 4. Bilateral inguinal hernias containing omental fat. The anterolateral aspect of the bladder is beginning to extend into the hernia sac on the left. 5. Additional ancillary findings as above without significant interval change. Signed, Sterling Big, MD, RPVI [facility]  Electronically Signed   By: Malachy Moan M.D.   On: 12/14/2022 14:38

## 2022-12-24 NOTE — Patient Instructions (Signed)
 [facility] - A DEPT OF [facility]  Discharge Instructions: Thank you for choosing [facility] to provide your oncology and hematology care.   If you have a lab appointment with the Cancer Center, please go directly to the Cancer Center and check in at the registration area.   Wear comfortable clothing and clothing appropriate for easy access to any Portacath or PICC line.   We strive to give you quality time with your provider. You may need to reschedule your appointment if you arrive late (15 or more minutes).  Arriving late affects you and other patients whose appointments are after yours.  Also, if you miss three or more appointments without notifying the office, you may be dismissed from the clinic at the provider's discretion.      For prescription refill requests, have your pharmacy contact our office and allow 72 hours for refills to be completed.    Today you received the following chemotherapy and/or immunotherapy agent: Durvalumab (Imfinzi)     To help prevent nausea and vomiting after your treatment, we encourage you to take your nausea medication as directed.  BELOW ARE SYMPTOMS THAT SHOULD BE REPORTED IMMEDIATELY: *FEVER GREATER THAN 100.4 F (38 C) OR HIGHER *CHILLS OR SWEATING *NAUSEA AND VOMITING THAT IS NOT CONTROLLED WITH YOUR NAUSEA MEDICATION *UNUSUAL SHORTNESS OF BREATH *UNUSUAL BRUISING OR BLEEDING *URINARY PROBLEMS (pain or burning when urinating, or frequent urination) *BOWEL PROBLEMS (unusual diarrhea, constipation, pain near the anus) TENDERNESS IN MOUTH AND THROAT WITH OR WITHOUT PRESENCE OF ULCERS (sore throat, sores in mouth, or a toothache) UNUSUAL RASH, SWELLING OR PAIN  UNUSUAL VAGINAL DISCHARGE OR ITCHING   Items with * indicate a potential emergency and should be followed up as soon as possible or go to the Emergency Department if any problems should occur.  Please show the CHEMOTHERAPY ALERT CARD or  IMMUNOTHERAPY ALERT CARD at check-in to the Emergency Department and triage nurse.  Should you have questions after your visit or need to cancel or reschedule your appointment, please contact [facility] - A DEPT OF [facility]  Dept: [phone]  and follow the prompts.  Office hours are 8:00 a.m. to 4:30 p.m. Monday - Friday. Please note that voicemails left after 4:00 p.m. may not be returned until the following business day.  We are closed weekends and major holidays. You have access to a nurse at all times for urgent questions. Please call the main number to the clinic Dept: [phone] and follow the prompts.   For any non-urgent questions, you may also contact your provider using MyChart. We now offer e-Visits for anyone 60 and older to request care online for non-urgent symptoms. For details visit [email or web address].   Also download the MyChart app! Go to the app store, search "MyChart", open the app, select [facility], and log in with your MyChart username and password.  Durvalumab Injection What is this medication? DURVALUMAB (dur VAL ue mab) treats some types of cancer. It works by helping your immune system slow or stop the spread of cancer cells. It is a monoclonal antibody. This medicine may be used for other purposes; ask your health care provider or pharmacist if you have questions. COMMON BRAND NAME(S): IMFINZI What should I tell my care team before I take this medication? They need to know if you have any of these conditions: Allogeneic stem cell transplant (uses someone else's stem cells) Autoimmune diseases, such as Crohn  disease, ulcerative colitis, lupus History of chest radiation Nervous system problems, such as Guillain-Barre syndrome, myasthenia gravis Organ transplant An unusual or allergic reaction to durvalumab, other medications, foods, dyes, or preservatives Pregnant or trying to get pregnant Breast-feeding How should I  use this medication? This medication is infused into a vein. It is given by your care team in a hospital or clinic setting. A special MedGuide will be given to you before each treatment. Be sure to read this information carefully each time. Talk to your care team about the use of this medication in children. Special care may be needed. Overdosage: If you think you have taken too much of this medicine contact a poison control center or emergency room at once. NOTE: This medicine is only for you. Do not share this medicine with others. What if I miss a dose? Keep appointments for follow-up doses. It is important not to miss your dose. Call your care team if you are unable to keep an appointment. What may interact with this medication? Interactions have not been studied. This list may not describe all possible interactions. Give your health care provider a list of all the medicines, herbs, non-prescription drugs, or dietary supplements you use. Also tell them if you smoke, drink alcohol, or use illegal drugs. Some items may interact with your medicine. What should I watch for while using this medication? Your condition will be monitored carefully while you are receiving this medication. You may need blood work while taking this medication. This medication may cause serious skin reactions. They can happen weeks to months after starting the medication. Contact your care team right away if you notice fevers or flu-like symptoms with a rash. The rash may be red or purple and then turn into blisters or peeling of the skin. You may also notice a red rash with swelling of the face, lips, or lymph nodes in your neck or under your arms. Tell your care team right away if you have any change in your eyesight. Talk to your care team if you may be pregnant. Serious birth defects can occur if you take this medication during pregnancy and for 3 months after the last dose. You will need a negative pregnancy test before  starting this medication. Contraception is recommended while taking this medication and for 3 months after the last dose. Your care team can help you find the option that works for you. Do not breastfeed while taking this medication and for 3 months after the last dose. What side effects may I notice from receiving this medication? Side effects that you should report to your care team as soon as possible: Allergic reactions--skin rash, itching, hives, swelling of the face, lips, tongue, or throat Dry cough, shortness of breath or trouble breathing Eye pain, redness, irritation, or discharge with blurry or decreased vision Heart muscle inflammation--unusual weakness or fatigue, shortness of breath, chest pain, fast or irregular heartbeat, dizziness, swelling of the ankles, feet, or hands Hormone gland problems--headache, sensitivity to light, unusual weakness or fatigue, dizziness, fast or irregular heartbeat, increased sensitivity to cold or heat, excessive sweating, constipation, hair loss, increased thirst or amount of urine, tremors or shaking, irritability Infusion reactions--chest pain, shortness of breath or trouble breathing, feeling faint or lightheaded Kidney injury (glomerulonephritis)--decrease in the amount of urine, red or dark brown urine, foamy or bubbly urine, swelling of the ankles, hands, or feet Liver injury--right upper belly pain, loss of appetite, nausea, light-colored stool, dark yellow or brown urine, yellowing skin  or eyes, unusual weakness or fatigue Pain, tingling, or numbness in the hands or feet, muscle weakness, change in vision, confusion or trouble speaking, loss of balance or coordination, trouble walking, seizures Rash, fever, and swollen lymph nodes Redness, blistering, peeling, or loosening of the skin, including inside the mouth Sudden or severe stomach pain, bloody diarrhea, fever, nausea, vomiting Side effects that usually do not require medical attention  (report these to your care team if they continue or are bothersome): Bone, joint, or muscle pain Diarrhea Fatigue Loss of appetite Nausea Skin rash This list may not describe all possible side effects. Call your doctor for medical advice about side effects. You may report side effects to FDA at 1-800-FDA-1088. Where should I keep my medication? This medication is given in a hospital or clinic. It will not be stored at home. NOTE: This sheet is a summary. It may not cover all possible information. If you have questions about this medicine, talk to your doctor, pharmacist, or health care provider.  2024 Elsevier/Gold Standard (2021-06-05 00:00:00)

## 2022-12-24 NOTE — Assessment & Plan Note (Addendum)
His BP is good and he has no signs or symptoms of CHF Continue risk factor management

## 2023-01-08 ENCOUNTER — Ambulatory Visit: Payer: Medicare HMO | Admitting: Vascular Surgery

## 2023-01-14 ENCOUNTER — Encounter: Payer: Self-pay | Admitting: Physician Assistant

## 2023-01-14 ENCOUNTER — Ambulatory Visit: Payer: Medicare HMO | Attending: Physician Assistant | Admitting: Physician Assistant

## 2023-01-14 VITALS — BP 106/62 | HR 76 | Ht 70.0 in | Wt 147.0 lb

## 2023-01-14 DIAGNOSIS — I2510 Atherosclerotic heart disease of native coronary artery without angina pectoris: Secondary | ICD-10-CM | POA: Diagnosis not present

## 2023-01-14 DIAGNOSIS — I5042 Chronic combined systolic (congestive) and diastolic (congestive) heart failure: Secondary | ICD-10-CM | POA: Diagnosis not present

## 2023-01-14 DIAGNOSIS — I10 Essential (primary) hypertension: Secondary | ICD-10-CM

## 2023-01-14 DIAGNOSIS — Z95810 Presence of automatic (implantable) cardiac defibrillator: Secondary | ICD-10-CM | POA: Diagnosis not present

## 2023-01-14 DIAGNOSIS — I480 Paroxysmal atrial fibrillation: Secondary | ICD-10-CM | POA: Diagnosis not present

## 2023-01-14 DIAGNOSIS — E785 Hyperlipidemia, unspecified: Secondary | ICD-10-CM

## 2023-01-14 NOTE — Patient Instructions (Signed)
 Medication Instructions:  NO CHANGES *If you need a refill on your cardiac medications before your next appointment, please call your pharmacy*   Lab Work: NO LABS If you have labs (blood work) drawn today and your tests are completely normal, you will receive your results only by: MyChart Message (if you have MyChart) OR A paper copy in the mail If you have any lab test that is abnormal or we need to change your treatment, we will call you to review the results.   Testing/Procedures: NO TESTING   Follow-Up: At [facility], you and your health needs are our priority.  As part of our continuing mission to provide you with exceptional heart care, we have created designated Provider Care Teams.  These Care Teams include your primary Cardiologist (physician) and Advanced Practice Providers (APPs -  Physician Assistants and Nurse Practitioners) who all work together to provide you with the care you need, when you need it.  Your next appointment:   4 month(s)  Provider:   Olga Millers, MD

## 2023-01-14 NOTE — Progress Notes (Unsigned)
Cardiology Office Note:  .   Date:  01/14/2023  ID:  [name], DOB 10/07/1945, MRN [id number] PCP: [name], MD  [facility] Providers Cardiologist:  Olga Millers, MD Electrophysiologist:  [name], MD { Click to update primary MD,subspecialty MD or APP then REFRESH:1}   History of Present Illness: .   Benjamin Cook is a 77 y.o. male with past medical history of hypertension, hyperlipidemia, CAD, PAF, history of VT s/p ablation 9/22, chronic combined systolic and diastolic heart failure with baseline EF 20 to 25% s/p AICD, stage III non-small cell lung cancer s/p rad/chemo, COPD, and AAA s/p EVAR 2019.  Patient had a history of LAD stenting in 2001.  Cardiac catheterization in September 2022 showed 75% calcified distal left main, previously placed mid LAD stent was widely patent.  Patient had a 70% mid AV groove left circumflex and RCA disease.  He was not considered a surgical candidate at the time. Patient underwent VT ablation in September 2022.  Echocardiogram in April 2024 showed global hypokinesis with aneurysm formation at the apex, EF 20 to 25%, grade 1 DD.  Abdominal ultrasound in May 2024 showed abdominal aortic endograft placement, and the distal portion of occluded aneurysmal sac measuring 6.1 cm.   He was seen in the ED in September 2024 with complaint of acute shortness of breath for 1 month.  He was hypoxic in the 80s and was placed on BiPAP.  Chest x-ray showed no pneumonia or edema.  EKG showed no ischemia.  He was febrile during the admission, repeat chest x-ray demonstrated pneumonia.  Blood culture showed no growth.  BNP elevated at 2.4K.  He was able to maintain sinus rhythm during the hospitalization.  He was trialed on steroid with prednisone 40 mg for 5 days.  It was noted he was difficult to wean and upon discharge patent show and was able to maintain stable O2 saturation on 3 L nasal cannula.  Etiology behind hypoxemia was likely  multifactorial including worsening ability to compensate for underlying lung disease, chemotherapy, lung cancer, heart failure and amiodarone therapy.  He underwent diuresis with IV Lasix and transition to oral Lasix.  He has been seen by palliative care service.  DNR/DNI status was confirmed.    Patient presents today for follow-up.  He denies any chest pain or shortness of breath.  He appears to be be euvolemic on exam.  His weight is stable.  He has no lower extremity edema, orthopnea or PND.  I do not think he can follow-up with Entresto.  He can follow-up with Dr. [name] in 4 months.  ROS: ***  Studies Reviewed: .        *** Risk Assessment/Calculations:   {Does this patient have ATRIAL FIBRILLATION?:[id number]}         Physical Exam:   VS:  BP 106/62   Pulse 76   Ht 5\' 10"  (1.778 m)   Wt 147 lb (66.7 kg)   SpO2 98%   BMI 21.09 kg/m    Wt Readings from Last 3 Encounters:  01/14/23 147 lb (66.7 kg)  12/24/22 143 lb 6.4 oz (65 kg)  11/26/22 140 lb 3.2 oz (63.6 kg)    GEN: Well nourished, well developed in no acute distress NECK: No JVD; No carotid bruits CARDIAC: ***RRR, no murmurs, rubs, gallops RESPIRATORY:  Clear to auscultation without rales, wheezing or rhonchi  ABDOMEN: Soft, non-tender, non-distended EXTREMITIES:  No edema; No deformity   ASSESSMENT AND PLAN: .   ***    {  Are you ordering a CV Procedure (e.g. stress test, cath, DCCV, TEE, etc)?   Press F2        :[id number]}  Dispo: ***  Signed, Azalee Course, PA

## 2023-01-16 NOTE — Addendum Note (Signed)
Addended byLisabeth Devoid, Ladarren Steiner on: 01/16/2023 11:51 AM   Modules accepted: Level of Service

## 2023-01-21 ENCOUNTER — Inpatient Hospital Stay: Payer: Medicare HMO

## 2023-01-21 ENCOUNTER — Encounter: Payer: Self-pay | Admitting: Hematology and Oncology

## 2023-01-21 ENCOUNTER — Inpatient Hospital Stay: Payer: Medicare HMO | Attending: Hematology and Oncology | Admitting: Hematology and Oncology

## 2023-01-21 VITALS — BP 126/79 | HR 85 | Temp 97.8°F | Resp 18 | Ht 70.0 in | Wt 144.8 lb

## 2023-01-21 DIAGNOSIS — I5042 Chronic combined systolic (congestive) and diastolic (congestive) heart failure: Secondary | ICD-10-CM | POA: Diagnosis not present

## 2023-01-21 DIAGNOSIS — Z5112 Encounter for antineoplastic immunotherapy: Secondary | ICD-10-CM | POA: Insufficient documentation

## 2023-01-21 DIAGNOSIS — C3491 Malignant neoplasm of unspecified part of right bronchus or lung: Secondary | ICD-10-CM

## 2023-01-21 DIAGNOSIS — C3490 Malignant neoplasm of unspecified part of unspecified bronchus or lung: Secondary | ICD-10-CM | POA: Diagnosis present

## 2023-01-21 LAB — CBC WITH DIFFERENTIAL (CANCER CENTER ONLY)
Abs Immature Granulocytes: 0.03 10*3/uL (ref 0.00–0.07)
Basophils Absolute: 0 10*3/uL (ref 0.0–0.1)
Basophils Relative: 1 %
Eosinophils Absolute: 0.3 10*3/uL (ref 0.0–0.5)
Eosinophils Relative: 4 %
HCT: 39.3 % (ref 39.0–52.0)
Hemoglobin: 13 g/dL (ref 13.0–17.0)
Immature Granulocytes: 1 %
Lymphocytes Relative: 14 %
Lymphs Abs: 0.9 10*3/uL (ref 0.7–4.0)
MCH: 31.6 pg (ref 26.0–34.0)
MCHC: 33.1 g/dL (ref 30.0–36.0)
MCV: 95.4 fL (ref 80.0–100.0)
Monocytes Absolute: 0.9 10*3/uL (ref 0.1–1.0)
Monocytes Relative: 15 %
Neutro Abs: 4.1 10*3/uL (ref 1.7–7.7)
Neutrophils Relative %: 65 %
Platelet Count: 171 10*3/uL (ref 150–400)
RBC: 4.12 MIL/uL — ABNORMAL LOW (ref 4.22–5.81)
RDW: 15.9 % — ABNORMAL HIGH (ref 11.5–15.5)
WBC Count: 6.2 10*3/uL (ref 4.0–10.5)
nRBC: 0 % (ref 0.0–0.2)

## 2023-01-21 LAB — CMP (CANCER CENTER ONLY)
ALT: 24 U/L (ref 0–44)
AST: 31 U/L (ref 15–41)
Albumin: 3.7 g/dL (ref 3.5–5.0)
Alkaline Phosphatase: 63 U/L (ref 38–126)
Anion gap: 7 (ref 5–15)
BUN: 11 mg/dL (ref 8–23)
CO2: 27 mmol/L (ref 22–32)
Calcium: 8.9 mg/dL (ref 8.9–10.3)
Chloride: 106 mmol/L (ref 98–111)
Creatinine: 0.99 mg/dL (ref 0.61–1.24)
GFR, Estimated: >60 mL/min (ref >60)
Glucose, Bld: 99 mg/dL (ref 70–99)
Potassium: 3.4 mmol/L — ABNORMAL LOW (ref 3.5–5.1)
Sodium: 140 mmol/L (ref 135–145)
Total Bilirubin: 0.6 mg/dL (ref <1.2)
Total Protein: 6.4 g/dL — ABNORMAL LOW (ref 6.5–8.1)

## 2023-01-21 MED ORDER — HEPARIN SOD (PORK) LOCK FLUSH 100 UNIT/ML IV SOLN
500.0000 [IU] | Freq: Once | INTRAVENOUS | Status: AC | PRN
  Administered 2023-01-21: 500 [IU]

## 2023-01-21 MED ORDER — SODIUM CHLORIDE 0.9 % IV SOLN: Freq: Once | INTRAVENOUS | Status: AC

## 2023-01-21 MED ORDER — SODIUM CHLORIDE 0.9% FLUSH
10.0000 mL | INTRAVENOUS | Status: DC | PRN
  Administered 2023-01-21: 10 mL

## 2023-01-21 MED ORDER — SODIUM CHLORIDE 0.9% FLUSH
10.0000 mL | Freq: Once | INTRAVENOUS | Status: AC
  Administered 2023-01-21: 10 mL

## 2023-01-21 MED ORDER — SODIUM CHLORIDE 0.9 % IV SOLN
1500.0000 mg | Freq: Once | INTRAVENOUS | Status: AC
  Administered 2023-01-21: 1500 mg via INTRAVENOUS
  Filled 2023-01-21: qty 30

## 2023-01-21 NOTE — Patient Instructions (Signed)
 [facility] - [facility]  Discharge Instructions: Thank you for choosing [facility] to provide your oncology and hematology care.   If you have a lab appointment with the Cancer Center, please go directly to the Cancer Center and check in at the registration area.   Wear comfortable clothing and clothing appropriate for easy access to any Portacath or PICC line.   We strive to give you quality time with your provider. You may need to reschedule your appointment if you arrive late (15 or more minutes).  Arriving late affects you and other patients whose appointments are after yours.  Also, if you miss three or more appointments without notifying the office, you may be dismissed from the clinic at the provider's discretion.      For prescription refill requests, have your pharmacy contact our office and allow 72 hours for refills to be completed.    Today you received the following chemotherapy and/or immunotherapy agents: Imfinzi      To help prevent nausea and vomiting after your treatment, we encourage you to take your nausea medication as directed.  BELOW ARE SYMPTOMS THAT SHOULD BE REPORTED IMMEDIATELY: *FEVER GREATER THAN 100.4 F (38 C) OR HIGHER *CHILLS OR SWEATING *NAUSEA AND VOMITING THAT IS NOT CONTROLLED WITH YOUR NAUSEA MEDICATION *UNUSUAL SHORTNESS OF BREATH *UNUSUAL BRUISING OR BLEEDING *URINARY PROBLEMS (pain or burning when urinating, or frequent urination) *BOWEL PROBLEMS (unusual diarrhea, constipation, pain near the anus) TENDERNESS IN MOUTH AND THROAT WITH OR WITHOUT PRESENCE OF ULCERS (sore throat, sores in mouth, or a toothache) UNUSUAL RASH, SWELLING OR PAIN  UNUSUAL VAGINAL DISCHARGE OR ITCHING   Items with * indicate a potential emergency and should be followed up as soon as possible or go to the Emergency Department if any problems should occur.  Please show the CHEMOTHERAPY ALERT CARD or IMMUNOTHERAPY  ALERT CARD at check-in to the Emergency Department and triage nurse.  Should you have questions after your visit or need to cancel or reschedule your appointment, please contact [facility] - [facility]  Dept: [phone]  and follow the prompts.  Office hours are 8:00 a.m. to 4:30 p.m. Monday - Friday. Please note that voicemails left after 4:00 p.m. may not be returned until the following business day.  We are closed weekends and major holidays. You have access to a nurse at all times for urgent questions. Please call the main number to the clinic Dept: [phone] and follow the prompts.   For any non-urgent questions, you may also contact your provider using MyChart. We now offer e-Visits for anyone 80 and older to request care online for non-urgent symptoms. For details visit [email or web address].   Also download the MyChart app! Go to the app store, search "MyChart", open the app, select , and log in with your MyChart username and password.

## 2023-01-21 NOTE — Progress Notes (Signed)
[facility] OFFICE PROGRESS NOTE  Patient Care Team: [name], [name], [name], [name]  ASSESSMENT & PLAN:  Non-small cell lung cancer [facility]) His last CT imaging showed resolution of abnormal lung changes Clinically, he is tolerating immunotherapy well We will proceed with immunotherapy as scheduled I plan to repeat imaging study again in January  Chronic combined systolic and diastolic heart failure ([name]) His BP is good and he has no signs or symptoms of CHF Continue risk factor management  Orders Placed This Encounter  Procedures   NM PET Image Restage (PS) Skull Base to Thigh (F-18 FDG)    Standing Status:   Future    Expected Date:   02/11/2023    Expiration Date:   01/21/2024    If indicated for the ordered procedure, I authorize the administration of a radiopharmaceutical per Radiology protocol:   Yes    Preferred imaging location?:   [facility]    Radiology Contrast Protocol - do NOT remove file path:   [email or web address]    All questions were answered. The patient knows to call the clinic with any problems, questions or concerns. The total time spent in the appointment was 30 minutes encounter with patients including review of chart and various tests results, discussions about plan of care and coordination of care plan   Artis Delay, MD 01/21/2023 10:14 AM  INTERVAL HISTORY: Please see below for problem oriented charting. he returns for treatment follow-up on immunotherapy He is here accompanied by his wife He is doing well His oxygen saturation is good without portable oxygen this morning Denies recent cough He is eating well with good appetite No signs or symptoms of congestive heart failure His labs are stable We discussed timing of next imaging  REVIEW OF SYSTEMS:    Constitutional: Denies fevers, chills or abnormal weight loss Eyes: Denies blurriness of vision Ears, nose, mouth, throat, and face: Denies mucositis or sore throat Respiratory: Denies cough, dyspnea or wheezes Cardiovascular: Denies palpitation, chest discomfort or lower extremity swelling Gastrointestinal:  Denies nausea, heartburn or change in bowel habits Skin: Denies abnormal skin rashes Lymphatics: Denies new lymphadenopathy or easy bruising Neurological:Denies numbness, tingling or new weaknesses Behavioral/Psych: Mood is stable, no new changes  All other systems were reviewed with the patient and are negative.  I have reviewed the past medical history, past surgical history, social history and family history with the patient and they are unchanged from previous note.  ALLERGIES:  is allergic to paclitaxel.  MEDICATIONS:  Current Outpatient Medications  Medication Sig Dispense Refill   acetaminophen (TYLENOL) 500 MG tablet Take 500-1,000 mg by mouth every 8 (eight) hours as needed (for pain).      amiodarone (PACERONE) 400 MG tablet Take 0.5 tablets (200 mg total) by mouth 2 (two) times daily. 90 tablet 2   atorvastatin (LIPITOR) 80 MG tablet TAKE 1 TABLET BY MOUTH DAILY AT 6 PM 90 tablet 3   Chlorpheniramine Maleate (ALLERGY RELIEF PO) Take 1 tablet by mouth daily as needed (allergies).     empagliflozin (JARDIANCE) 10 MG TABS tablet Take 1 tablet (10 mg total) by mouth daily before breakfast. 90 tablet 2   furosemide (LASIX) 40 MG tablet Take 0.5 tablets (20 mg total) by mouth daily. 45 tablet 2   gabapentin (NEURONTIN) 100 MG capsule Take 1 capsule (100 mg total) by mouth  at bedtime. 30 capsule 0   lidocaine-prilocaine (EMLA) cream Apply 1 Application topically as needed. 30 g 3   losartan (COZAAR) 25 MG tablet Take 0.5 tablets (12.5 mg total) by mouth daily. 45 tablet 3   melatonin 5 MG TABS Take 1 tablet (5 mg total) by mouth at bedtime. 30 tablet 0   metoprolol succinate  (TOPROL-XL) 25 MG 24 hr tablet Take 0.5 tablets (12.5 mg total) by mouth 2 (two) times daily. 90 tablet 2   mometasone-formoterol (DULERA) 100-5 MCG/ACT AERO Inhale 2 puffs into the lungs in the morning and at bedtime. 8.8 each 1   Multiple Vitamin (MULTIVITAMIN) capsule Take 1 capsule by mouth daily.     ondansetron (ZOFRAN) 8 MG tablet Take 1 tablet (8 mg total) by mouth every 8 (eight) hours as needed for nausea. 30 tablet 3   pantoprazole (PROTONIX) 40 MG tablet Take 40 mg by mouth daily.     Rivaroxaban (XARELTO) 15 MG TABS tablet Take 1 tablet (15 mg total) by mouth daily with supper. 30 tablet 5   senna-docusate (SENOKOT-S) 8.6-50 MG tablet Take 2 tablets by mouth daily. 60 tablet 1   spironolactone (ALDACTONE) 25 MG tablet Take 0.5 tablets (12.5 mg total) by mouth daily. 45 tablet 2   No current facility-administered medications for this visit.    SUMMARY OF ONCOLOGIC HISTORY: Oncology History Overview Note  Foundation One test showed no actionable mutations. 6TMB, MSI low   Non-small cell lung cancer ([name])  06/11/2022 Initial Diagnosis   Non-small cell lung cancer ([name])   06/20/2022 Pathology Results   CYTOLOGY - NON PAP  CASE: [id number]  PATIENT: [facility]  Non-Gynecological Cytology Report   Clinical History: Right hilar mass  Specimen Submitted:  A. LUNG, RIGHT HILAR MASS, FINE NEEDLE ASPIRATION:    FINAL MICROSCOPIC DIAGNOSIS:  - Malignant cells consistent with non-small cell carcinoma  - See comment   SPECIMEN ADEQUACY:  Satisfactory for evaluation   DIAGNOSTIC COMMENTS:  The specimen contains atypical cells with malignant appearance characterized by nuclear enlargement, pleomorphism, clumped chromatin and occasional nucleoli. Immunohistochemical stains were attempted however no cells were present on the stains. The morphology does not support a small cell carcinoma with differential diagnosis including adenocarcinoma, large cell neuroendocrine carcinoma, or  large cell carcinoma, et al. More diagnostic material is needed for further classification and molecular tests.    07/02/2022 PET scan   1. Right suprahilar hypermetabolic mass, consistent with primary lung malignancy. 2. Subcarinal hypermetabolic activity which is contiguous with right hilar mass, possibly due to mediastinal extension or subcarinal lymphadenopathy. 3. No evidence of metastatic disease in the abdomen or pelvis. 4. Right adrenal gland mass with low-level FDG uptake, unchanged when compared with prior exams dating back to 2018 and consistent with benign adenoma. 5. Hypermetabolic right paraspinal consolidation, likely infectious or inflammatory. 6. Aortic Atherosclerosis (ICD10-I70.0) and Emphysema (ICD10-J43.9).     07/02/2022 Cancer Staging   Staging form: Lung, AJCC 8th Edition - Clinical stage from 07/02/2022: Stage IIIA (cT2, cN2, cM0) - Signed by Artis Delay, MD on 07/02/2022 Stage prefix: Initial diagnosis   07/09/2022 Procedure   Successful placement of a right internal jugular approach power injectable Port-A-Cath. The catheter is ready for immediate use.     07/15/2022 - 08/12/2022 Chemotherapy   Patient is on Treatment Plan : LUNG Carboplatin + Paclitaxel + XRT q7d     10/01/2022 -  Chemotherapy   Patient is on Treatment Plan : LUNG NSCLC Durvalumab (1500) q28d  10/01/2022 PET scan   NM PET Image Restage (PS) Skull Base to Thigh (F-18 FDG)  Result Date: 09/30/2022 CLINICAL DATA:  Subsequent treatment strategy for non-small cell lung cancer. EXAM: NUCLEAR MEDICINE PET SKULL BASE TO THIGH TECHNIQUE: 6.33 mCi F-18 FDG was injected intravenously. Full-ring PET imaging was performed from the skull base to thigh after the radiotracer. CT data was obtained and used for attenuation correction and anatomic localization. Fasting blood glucose: 89 mg/dl COMPARISON:  PET-CT 56/21/3086.  Chest CT 06/03/2022. FINDINGS: Mediastinal blood pool activity: SUV max 2.1 NECK: No  hypermetabolic cervical lymph nodes are identified.Asymmetric activity within the left pterygoid musculature, likely physiologic. No suspicious activity identified within the pharyngeal mucosal space. Incidental CT findings: Prominent bilateral carotid atherosclerosis. CHEST: There is no residual hypermetabolic activity within the previously demonstrated right suprahilar mass which has significantly decreased in size in the interval. This currently measures approximately 2.5 x 2.0 cm on image 32/7 and has an SUV max of 3.5 (previously 23.4). Similar 1.2 cm short axis subcarinal node on image 71/4 within SUV max of 3.5 (previously 2.6). No other hypermetabolic mediastinal, hilar or axillary lymph nodes. There are new extensive bandlike opacities throughout the right lung with a perihilar distribution. These demonstrate hypermetabolic activity with an SUV max of 11.5 posteriorly. Based on distribution, these findings are most likely secondary to radiation pneumonitis. No abnormal metabolic activity or suspicious nodularity in the left lung. Incidental CT findings: Moderate to severe centrilobular emphysema with diffuse central airway thickening and scattered pulmonary scarring. Atherosclerosis of the aorta, great vessels and coronary arteries. Right IJ Port-A-Cath extends to the superior cavoatrial junction. Left subclavian pacemaker leads appear unchanged. ABDOMEN/PELVIS: There is no hypermetabolic activity within the liver, left adrenal gland, spleen or pancreas. A right adrenal nodule with low level metabolic activity is unchanged. This measures 4.2 x 3.1 cm on image 107/4 and has an SUV max of 3.2. There is no hypermetabolic nodal activity in the abdomen or pelvis. Incidental CT findings: Diffuse aortic and branch vessel atherosclerosis status post aortoiliac endograft stenting. The excluded aneurysm measures approximately 4.5 cm in AP diameter, unchanged. Stable bilateral renal cysts for which no follow-up  imaging is recommended. Stable marked enlargement of the prostate gland without hypermetabolic activity. SKELETON: There is no hypermetabolic activity to suggest osseous metastatic disease. Incidental CT findings: Multilevel spondylosis. IMPRESSION: 1. Significant interval response to therapy. The previously demonstrated right suprahilar mass has significantly decreased in size and is no longer hypermetabolic. 2. New hypermetabolic bandlike opacities throughout the right lung with a perihilar distribution, most consistent with radiation pneumonitis. Infection considered less likely. Attention on follow-up recommended. 3. No definite signs of metastatic disease. A mildly hypermetabolic subcarinal lymph node is similar to the prior study and likely reactive. 4. Stable right adrenal nodule with low level metabolic activity, likely an adenoma based on long-term stability from previous CTs dating back to 10/14/2003. 5. Aortic Atherosclerosis (ICD10-I70.0) and Emphysema (ICD10-J43.9). Electronically Signed   By: Carey Bullocks M.D.   On: 09/30/2022 15:09   CUP PACEART REMOTE DEVICE CHECK  Result Date: 09/29/2022 Scheduled remote reviewed. Normal device function.  The device estimates 10.4 months until ERI There was one atrial arrhythmia detected that was a short burst of noise There was one 30 second NSVT that 122 bpm and appear to be ST and not VT Next remote 91 days. Hassell Halim, RN, CCDS, [employer]       PHYSICAL EXAMINATION: ECOG PERFORMANCE STATUS: 2 - Symptomatic, <50% confined  to bed  Vitals:   01/21/23 1000  BP: 126/79  Pulse: 85  Resp: 18  Temp: 97.8 F (36.6 C)  SpO2: 96%   Filed Weights   01/21/23 1000  Weight: 144 lb 12.8 oz (65.7 kg)    GENERAL:alert, no distress and comfortable NEURO: alert & oriented x 3 with fluent speech, no focal motor/sensory deficits  LABORATORY DATA:  I have reviewed the data as listed    Component Value Date/Time   NA 138 12/24/2022 0909    NA 138 04/15/2022 0913   K 3.7 12/24/2022 0909   CL 104 12/24/2022 0909   CO2 29 12/24/2022 0909   GLUCOSE 96 12/24/2022 0909   BUN 12 12/24/2022 0909   BUN 7 (L) 04/15/2022 0913   CREATININE 1.01 12/24/2022 0909   CREATININE 0.88 09/15/2015 0959   CALCIUM 8.9 12/24/2022 0909   PROT 6.9 12/24/2022 0909   PROT 6.8 11/07/2020 1303   ALBUMIN 3.8 12/24/2022 0909   ALBUMIN 3.6 (L) 11/07/2020 1303   AST 26 12/24/2022 0909   ALT 20 12/24/2022 0909   ALKPHOS 85 12/24/2022 0909   BILITOT 0.6 12/24/2022 0909   GFRNONAA >60 12/24/2022 0909   GFRAA 96 03/17/2020 1016    No results found for: "SPEP", "UPEP"  Lab Results  Component Value Date   WBC 6.2 01/21/2023   NEUTROABS 4.1 01/21/2023   HGB 13.0 01/21/2023   HCT 39.3 01/21/2023   MCV 95.4 01/21/2023   PLT 171 01/21/2023      Chemistry      Component Value Date/Time   NA 138 12/24/2022 0909   NA 138 04/15/2022 0913   K 3.7 12/24/2022 0909   CL 104 12/24/2022 0909   CO2 29 12/24/2022 0909   BUN 12 12/24/2022 0909   BUN 7 (L) 04/15/2022 0913   CREATININE 1.01 12/24/2022 0909   CREATININE 0.88 09/15/2015 0959      Component Value Date/Time   CALCIUM 8.9 12/24/2022 0909   ALKPHOS 85 12/24/2022 0909   AST 26 12/24/2022 0909   ALT 20 12/24/2022 0909   BILITOT 0.6 12/24/2022 0909

## 2023-01-21 NOTE — Assessment & Plan Note (Signed)
 His BP is good and he has no signs or symptoms of CHF Continue risk factor management

## 2023-01-21 NOTE — Assessment & Plan Note (Signed)
His last CT imaging showed resolution of abnormal lung changes Clinically, he is tolerating immunotherapy well We will proceed with immunotherapy as scheduled I plan to repeat imaging study again in January

## 2023-01-22 ENCOUNTER — Ambulatory Visit (INDEPENDENT_AMBULATORY_CARE_PROVIDER_SITE_OTHER): Payer: Medicare (Managed Care)

## 2023-01-22 DIAGNOSIS — I5042 Chronic combined systolic (congestive) and diastolic (congestive) heart failure: Secondary | ICD-10-CM

## 2023-01-22 DIAGNOSIS — I255 Ischemic cardiomyopathy: Secondary | ICD-10-CM

## 2023-01-22 LAB — CUP PACEART REMOTE DEVICE CHECK
Battery Remaining Longevity: 7 mo
Battery Remaining Percentage: 7 %
Battery Voltage: 2.65 V
Brady Statistic AP VP Percent: 1.9 %
Brady Statistic AP VS Percent: 6.4 %
Brady Statistic AS VP Percent: 1 %
Brady Statistic AS VS Percent: 91 %
Brady Statistic RA Percent Paced: 8 %
Brady Statistic RV Percent Paced: 2.8 %
Date Time Interrogation Session: 20241218054435
HighPow Impedance: 63 Ohm
HighPow Impedance: 63 Ohm
Implantable Lead Connection Status: 753985
Implantable Lead Connection Status: 753985
Implantable Lead Implant Date: 20120420
Implantable Lead Implant Date: 20180613
Implantable Lead Location: 753859
Implantable Lead Location: 753860
Implantable Pulse Generator Implant Date: 20160212
Lead Channel Impedance Value: 300 Ohm
Lead Channel Impedance Value: 580 Ohm
Lead Channel Pacing Threshold Amplitude: 0.5 V
Lead Channel Pacing Threshold Amplitude: 0.75 V
Lead Channel Pacing Threshold Pulse Width: 0.5 ms
Lead Channel Pacing Threshold Pulse Width: 0.5 ms
Lead Channel Sensing Intrinsic Amplitude: 1.2 mV
Lead Channel Sensing Intrinsic Amplitude: 11.6 mV
Lead Channel Setting Pacing Amplitude: 2 V
Lead Channel Setting Pacing Amplitude: 2.5 V
Lead Channel Setting Pacing Pulse Width: 0.5 ms
Lead Channel Setting Sensing Sensitivity: 0.5 mV
Pulse Gen Serial Number: 7225079

## 2023-01-27 ENCOUNTER — Telehealth: Payer: Self-pay | Admitting: Cardiology

## 2023-01-27 MED ORDER — ATORVASTATIN CALCIUM 80 MG PO TABS: ORAL_TABLET | ORAL | 3 refills | Status: DC

## 2023-01-27 NOTE — Telephone Encounter (Signed)
Pt's medication was sent to pt's pharmacy as requested. Confirmation received.  °

## 2023-01-27 NOTE — Telephone Encounter (Signed)
 *  STAT* If patient is at the pharmacy, call can be transferred to refill team.   1. Which medications need to be refilled? (please list name of each medication and dose if known)   atorvastatin (LIPITOR) 80 MG tablet   2. Which pharmacy/location (including street and city if local pharmacy) is medication to be sent to?  New [facility]acy:  [facility] - [address] [place] - [address]   3. Do they need a 30 day or 90 day supply? 90 days

## 2023-02-11 ENCOUNTER — Encounter (HOSPITAL_COMMUNITY): Payer: Medicare HMO

## 2023-02-13 ENCOUNTER — Ambulatory Visit (HOSPITAL_COMMUNITY)
Admission: RE | Admit: 2023-02-13 | Discharge: 2023-02-13 | Disposition: A | Discharge status: Home / Self Care | Payer: Medicare HMO | Source: Ambulatory Visit | Attending: Hematology and Oncology | Admitting: Hematology and Oncology

## 2023-02-13 DIAGNOSIS — C3491 Malignant neoplasm of unspecified part of right bronchus or lung: Secondary | ICD-10-CM | POA: Diagnosis present

## 2023-02-13 LAB — GLUCOSE, CAPILLARY: Glucose-Capillary: 114 mg/dL — ABNORMAL HIGH (ref 70–99)

## 2023-02-13 MED ORDER — FLUDEOXYGLUCOSE F - 18 (FDG) INJECTION
6.0000 | Freq: Once | INTRAVENOUS | Status: AC | PRN
  Administered 2023-02-13: 6 via INTRAVENOUS

## 2023-02-18 ENCOUNTER — Inpatient Hospital Stay: Payer: Medicare HMO

## 2023-02-18 ENCOUNTER — Inpatient Hospital Stay (HOSPITAL_BASED_OUTPATIENT_CLINIC_OR_DEPARTMENT_OTHER): Payer: Medicare HMO | Admitting: Hematology and Oncology

## 2023-02-18 ENCOUNTER — Encounter: Payer: Self-pay | Admitting: Hematology and Oncology

## 2023-02-18 ENCOUNTER — Inpatient Hospital Stay: Payer: Medicare HMO | Attending: Hematology and Oncology

## 2023-02-18 VITALS — BP 111/57 | HR 70 | Resp 15

## 2023-02-18 VITALS — BP 140/91 | HR 89 | Temp 99.5°F | Resp 18 | Ht 70.0 in | Wt 145.6 lb

## 2023-02-18 DIAGNOSIS — C3490 Malignant neoplasm of unspecified part of unspecified bronchus or lung: Secondary | ICD-10-CM | POA: Diagnosis present

## 2023-02-18 DIAGNOSIS — Z5112 Encounter for antineoplastic immunotherapy: Secondary | ICD-10-CM | POA: Diagnosis present

## 2023-02-18 DIAGNOSIS — Z79899 Other long term (current) drug therapy: Secondary | ICD-10-CM | POA: Diagnosis not present

## 2023-02-18 DIAGNOSIS — C3491 Malignant neoplasm of unspecified part of right bronchus or lung: Secondary | ICD-10-CM

## 2023-02-18 DIAGNOSIS — I5042 Chronic combined systolic (congestive) and diastolic (congestive) heart failure: Secondary | ICD-10-CM

## 2023-02-18 LAB — CBC WITH DIFFERENTIAL (CANCER CENTER ONLY)
Abs Immature Granulocytes: 0.02 10*3/uL (ref 0.00–0.07)
Basophils Absolute: 0 10*3/uL (ref 0.0–0.1)
Basophils Relative: 1 %
Eosinophils Absolute: 0.2 10*3/uL (ref 0.0–0.5)
Eosinophils Relative: 2 %
HCT: 40.7 % (ref 39.0–52.0)
Hemoglobin: 13.7 g/dL (ref 13.0–17.0)
Immature Granulocytes: 0 %
Lymphocytes Relative: 14 %
Lymphs Abs: 1 10*3/uL (ref 0.7–4.0)
MCH: 31.5 pg (ref 26.0–34.0)
MCHC: 33.7 g/dL (ref 30.0–36.0)
MCV: 93.6 fL (ref 80.0–100.0)
Monocytes Absolute: 0.9 10*3/uL (ref 0.1–1.0)
Monocytes Relative: 13 %
Neutro Abs: 4.8 10*3/uL (ref 1.7–7.7)
Neutrophils Relative %: 70 %
Platelet Count: 222 10*3/uL (ref 150–400)
RBC: 4.35 MIL/uL (ref 4.22–5.81)
RDW: 14.8 % (ref 11.5–15.5)
WBC Count: 6.9 10*3/uL (ref 4.0–10.5)
nRBC: 0 % (ref 0.0–0.2)

## 2023-02-18 LAB — CMP (CANCER CENTER ONLY)
ALT: 30 U/L (ref 0–44)
AST: 31 U/L (ref 15–41)
Albumin: 3.9 g/dL (ref 3.5–5.0)
Alkaline Phosphatase: 66 U/L (ref 38–126)
Anion gap: 6 (ref 5–15)
BUN: 14 mg/dL (ref 8–23)
CO2: 27 mmol/L (ref 22–32)
Calcium: 8.9 mg/dL (ref 8.9–10.3)
Chloride: 106 mmol/L (ref 98–111)
Creatinine: 1.08 mg/dL (ref 0.61–1.24)
GFR, Estimated: >60 mL/min (ref >60)
Glucose, Bld: 112 mg/dL — ABNORMAL HIGH (ref 70–99)
Potassium: 3.4 mmol/L — ABNORMAL LOW (ref 3.5–5.1)
Sodium: 139 mmol/L (ref 135–145)
Total Bilirubin: 0.6 mg/dL (ref 0.0–1.2)
Total Protein: 7.1 g/dL (ref 6.5–8.1)

## 2023-02-18 LAB — TSH: TSH: 1.919 u[IU]/mL (ref 0.350–4.500)

## 2023-02-18 MED ORDER — SODIUM CHLORIDE 0.9 % IV SOLN: Freq: Once | INTRAVENOUS | Status: AC

## 2023-02-18 MED ORDER — DURVALUMAB 500 MG/10ML IV SOLN
1500.0000 mg | Freq: Once | INTRAVENOUS | Status: AC
  Administered 2023-02-18: 1500 mg via INTRAVENOUS
  Filled 2023-02-18: qty 30

## 2023-02-18 MED ORDER — SODIUM CHLORIDE 0.9% FLUSH
10.0000 mL | Freq: Once | INTRAVENOUS | Status: AC | PRN
  Administered 2023-02-18: 10 mL

## 2023-02-18 MED ORDER — SODIUM CHLORIDE 0.9% FLUSH
10.0000 mL | INTRAVENOUS | Status: DC | PRN
  Administered 2023-02-18: 10 mL

## 2023-02-18 MED ORDER — HEPARIN SOD (PORK) LOCK FLUSH 100 UNIT/ML IV SOLN
500.0000 [IU] | Freq: Once | INTRAVENOUS | Status: AC | PRN
  Administered 2023-02-18: 500 [IU]

## 2023-02-18 NOTE — Patient Instructions (Signed)

## 2023-02-18 NOTE — Assessment & Plan Note (Signed)
 I reviewed PET/CT imaging from January 2025 which show excellent response to therapy with no signs of cancer recurrence He will continue maintenance immunotherapy I plan to repeat imaging study again in July

## 2023-02-18 NOTE — Assessment & Plan Note (Signed)
 His BP is good and he has no signs or symptoms of CHF Continue risk factor management

## 2023-02-18 NOTE — Progress Notes (Signed)
 [facility] OFFICE PROGRESS NOTE  Patient Care Team: Maree Leni Edyth DELENA, MD as PCP - General (Family Medicine) Pietro Redell RAMAN, MD as PCP - Cardiology (Cardiology) Waddell Danelle ORN, [name]  ASSESSMENT & PLAN:  Non-small cell lung cancer [facility]) I reviewed PET/CT imaging from January 2025 which show excellent response to therapy with no signs of cancer recurrence He will continue maintenance immunotherapy I plan to repeat imaging study again in July  Chronic combined systolic and diastolic heart failure ([name]) His BP is good and he has no signs or symptoms of CHF Continue risk factor management  Orders Placed This Encounter  Procedures   CBC with Differential (Cancer Center Only)    Standing Status:   Future    Expected Date:   03/18/2023    Expiration Date:   03/17/2024   CMP (Cancer Center only)    Standing Status:   Future    Expected Date:   03/18/2023    Expiration Date:   03/17/2024   T4    Standing Status:   Future    Expected Date:   03/18/2023    Expiration Date:   03/17/2024   TSH    Standing Status:   Future    Expected Date:   03/18/2023    Expiration Date:   03/17/2024   CBC with Differential (Cancer Center Only)    Standing Status:   Future    Expected Date:   04/15/2023    Expiration Date:   04/14/2024   CMP (Cancer Center only)    Standing Status:   Future    Expected Date:   04/15/2023    Expiration Date:   04/14/2024   T4    Standing Status:   Future    Expected Date:   04/15/2023    Expiration Date:   04/14/2024   TSH    Standing Status:   Future    Expected Date:   04/15/2023    Expiration Date:   04/14/2024   CBC with Differential (Cancer Center Only)    Standing Status:   Future    Expected Date:   05/13/2023    Expiration Date:   05/12/2024   CMP (Cancer Center only)    Standing Status:   Future    Expected Date:   05/13/2023    Expiration Date:   05/12/2024   T4    Standing Status:   Future    Expected Date:    05/13/2023    Expiration Date:   05/12/2024   TSH    Standing Status:   Future    Expected Date:   05/13/2023    Expiration Date:   05/12/2024   CBC with Differential (Cancer Center Only)    Standing Status:   Future    Expected Date:   06/10/2023    Expiration Date:   06/09/2024   CMP (Cancer Center only)    Standing Status:   Future    Expected Date:   06/10/2023    Expiration Date:   06/09/2024   T4    Standing Status:   Future    Expected Date:   06/10/2023    Expiration Date:   06/09/2024   TSH    Standing Status:   Future    Expected Date:   06/10/2023    Expiration Date:   06/09/2024   CBC with Differential (Cancer Center Only)    Standing Status:   Future  Expected Date:   07/08/2023    Expiration Date:   07/07/2024   CMP (Cancer Center only)    Standing Status:   Future    Expected Date:   07/08/2023    Expiration Date:   07/07/2024   T4    Standing Status:   Future    Expected Date:   07/08/2023    Expiration Date:   07/07/2024   TSH    Standing Status:   Future    Expected Date:   07/08/2023    Expiration Date:   07/07/2024    All questions were answered. The patient knows to call the clinic with any problems, questions or concerns. The total time spent in the appointment was 30 minutes encounter with patients including review of chart and various tests results, discussions about plan of care and coordination of care plan   [name], MD 02/18/2023 1:02 PM  INTERVAL HISTORY: Please see below for problem oriented charting. he returns for treatment follow-up Is here accompanied by his wife He is doing well He is not needing regular oxygen during daytime He denies recent cough, chest pain or shortness of breath He is doing well overall and eating well We discussed PET/CT imaging results and future follow-up  REVIEW OF SYSTEMS:   Constitutional: Denies fevers, chills or abnormal weight loss Eyes: Denies blurriness of vision Ears, nose, mouth, throat, and face: Denies mucositis or sore  throat Respiratory: Denies cough, dyspnea or wheezes Cardiovascular: Denies palpitation, chest discomfort or lower extremity swelling Gastrointestinal:  Denies nausea, heartburn or change in bowel habits Skin: Denies abnormal skin rashes Lymphatics: Denies new lymphadenopathy or easy bruising Neurological:Denies numbness, tingling or new weaknesses Behavioral/Psych: Mood is stable, no new changes  All other systems were reviewed with the patient and are negative.  I have reviewed the past medical history, past surgical history, social history and family history with the patient and they are unchanged from previous note.  ALLERGIES:  is allergic to paclitaxel .  MEDICATIONS:  Current Outpatient Medications  Medication Sig Dispense Refill   acetaminophen  (TYLENOL ) 500 MG tablet Take 500-1,000 mg by mouth every 8 (eight) hours as needed (for pain).      amiodarone  (PACERONE ) 400 MG tablet Take 0.5 tablets (200 mg total) by mouth 2 (two) times daily. 90 tablet 2   atorvastatin  (LIPITOR ) 80 MG tablet TAKE 1 TABLET BY MOUTH  DAILY AT 6 PM 90 tablet 3   Chlorpheniramine Maleate (ALLERGY RELIEF PO) Take 1 tablet by mouth daily as needed (allergies).     empagliflozin  (JARDIANCE ) 10 MG TABS tablet Take 1 tablet (10 mg total) by mouth daily before breakfast. 90 tablet 2   furosemide  (LASIX ) 40 MG tablet Take 0.5 tablets (20 mg total) by mouth daily. 45 tablet 2   gabapentin  (NEURONTIN ) 100 MG capsule Take 1 capsule (100 mg total) by mouth at bedtime. 30 capsule 0   lidocaine -prilocaine  (EMLA ) cream Apply 1 Application topically as needed. 30 g 3   losartan  (COZAAR ) 25 MG tablet Take 0.5 tablets (12.5 mg total) by mouth daily. 45 tablet 3   melatonin 5 MG TABS Take 1 tablet (5 mg total) by mouth at bedtime. 30 tablet 0   metoprolol  succinate (TOPROL -XL) 25 MG 24 hr tablet Take 0.5 tablets (12.5 mg total) by mouth 2 (two) times daily. 90 tablet 2   mometasone -formoterol  (DULERA ) 100-5 MCG/ACT AERO  Inhale 2 puffs into the lungs in the morning and at bedtime. 8.8 each 1   Multiple Vitamin (  MULTIVITAMIN) capsule Take 1 capsule by mouth daily.     ondansetron  (ZOFRAN ) 8 MG tablet Take 1 tablet (8 mg total) by mouth every 8 (eight) hours as needed for nausea. 30 tablet 3   pantoprazole  (PROTONIX ) 40 MG tablet Take 40 mg by mouth daily.     Rivaroxaban  (XARELTO ) 15 MG TABS tablet Take 1 tablet (15 mg total) by mouth daily with supper. 30 tablet 5   senna-docusate (SENOKOT-S) 8.6-50 MG tablet Take 2 tablets by mouth daily. 60 tablet 1   spironolactone  (ALDACTONE ) 25 MG tablet Take 0.5 tablets (12.5 mg total) by mouth daily. 45 tablet 2   No current facility-administered medications for this visit.    SUMMARY OF ONCOLOGIC HISTORY: Oncology History Overview Note  Foundation One test showed no actionable mutations. 6TMB, MSI low   Non-small cell lung cancer ([name])  06/11/2022 Initial Diagnosis   Non-small cell lung cancer ([name])   06/20/2022 Pathology Results   CYTOLOGY - NON PAP  CASE: [id number]  PATIENT: Shaheim Ruhe  Non-Gynecological Cytology Report   Clinical History: Right hilar mass  Specimen Submitted:  A. LUNG, RIGHT HILAR MASS, FINE NEEDLE ASPIRATION:    FINAL MICROSCOPIC DIAGNOSIS:  - Malignant cells consistent with non-small cell carcinoma  - See comment   SPECIMEN ADEQUACY:  Satisfactory for evaluation   DIAGNOSTIC COMMENTS:  The specimen contains atypical cells with malignant appearance characterized by nuclear enlargement, pleomorphism, clumped chromatin and occasional nucleoli. Immunohistochemical stains were attempted however no cells were present on the stains. The morphology does not support a small cell carcinoma with differential diagnosis including adenocarcinoma, large cell neuroendocrine carcinoma, or large cell carcinoma, et al. More diagnostic material is needed for further classification and molecular tests.    07/02/2022 PET scan   1. Right suprahilar  hypermetabolic mass, consistent with primary lung malignancy. 2. Subcarinal hypermetabolic activity which is contiguous with right hilar mass, possibly due to mediastinal extension or subcarinal lymphadenopathy. 3. No evidence of metastatic disease in the abdomen or pelvis. 4. Right adrenal gland mass with low-level FDG uptake, unchanged when compared with prior exams dating back to 2018 and consistent with benign adenoma. 5. Hypermetabolic right paraspinal consolidation, likely infectious or inflammatory. 6. Aortic Atherosclerosis (ICD10-I70.0) and Emphysema (ICD10-J43.9).     07/02/2022 Cancer Staging   Staging form: Lung, AJCC 8th Edition - Clinical stage from 07/02/2022: Stage IIIA (cT2, cN2, cM0) - Signed by Lonn Hicks, MD on 07/02/2022 Stage prefix: Initial diagnosis   07/09/2022 Procedure   Successful placement of a right internal jugular approach power injectable Port-A-Cath. The catheter is ready for immediate use.     07/15/2022 - 08/12/2022 Chemotherapy   Patient is on Treatment Plan : LUNG Carboplatin  + Paclitaxel  + XRT q7d     10/01/2022 -  Chemotherapy   Patient is on Treatment Plan : LUNG NSCLC Durvalumab  (1500) q28d     10/01/2022 PET scan   NM PET Image Restage (PS) Skull Base to Thigh (F-18 FDG)  Result Date: 09/30/2022 CLINICAL DATA:  Subsequent treatment strategy for non-small cell lung cancer. EXAM: NUCLEAR MEDICINE PET SKULL BASE TO THIGH TECHNIQUE: 6.33 mCi F-18 FDG was injected intravenously. Full-ring PET imaging was performed from the skull base to thigh after the radiotracer. CT data was obtained and used for attenuation correction and anatomic localization. Fasting blood glucose: 89 mg/dl COMPARISON:  PET-CT 94/75/7975.  Chest CT 06/03/2022. FINDINGS: Mediastinal blood pool activity: SUV max 2.1 NECK: No hypermetabolic cervical lymph nodes are identified.Asymmetric activity within the left pterygoid  musculature, likely physiologic. No suspicious activity identified within  the pharyngeal mucosal space. Incidental CT findings: Prominent bilateral carotid atherosclerosis. CHEST: There is no residual hypermetabolic activity within the previously demonstrated right suprahilar mass which has significantly decreased in size in the interval. This currently measures approximately 2.5 x 2.0 cm on image 32/7 and has an SUV max of 3.5 (previously 23.4). Similar 1.2 cm short axis subcarinal node on image 71/4 within SUV max of 3.5 (previously 2.6). No other hypermetabolic mediastinal, hilar or axillary lymph nodes. There are new extensive bandlike opacities throughout the right lung with a perihilar distribution. These demonstrate hypermetabolic activity with an SUV max of 11.5 posteriorly. Based on distribution, these findings are most likely secondary to radiation pneumonitis. No abnormal metabolic activity or suspicious nodularity in the left lung. Incidental CT findings: Moderate to severe centrilobular emphysema with diffuse central airway thickening and scattered pulmonary scarring. Atherosclerosis of the aorta, great vessels and coronary arteries. Right IJ Port-A-Cath extends to the superior cavoatrial junction. Left subclavian pacemaker leads appear unchanged. ABDOMEN/PELVIS: There is no hypermetabolic activity within the liver, left adrenal gland, spleen or pancreas. A right adrenal nodule with low level metabolic activity is unchanged. This measures 4.2 x 3.1 cm on image 107/4 and has an SUV max of 3.2. There is no hypermetabolic nodal activity in the abdomen or pelvis. Incidental CT findings: Diffuse aortic and branch vessel atherosclerosis status post aortoiliac endograft stenting. The excluded aneurysm measures approximately 4.5 cm in AP diameter, unchanged. Stable bilateral renal cysts for which no follow-up imaging is recommended. Stable marked enlargement of the prostate gland without hypermetabolic activity. SKELETON: There is no hypermetabolic activity to suggest osseous  metastatic disease. Incidental CT findings: Multilevel spondylosis. IMPRESSION: 1. Significant interval response to therapy. The previously demonstrated right suprahilar mass has significantly decreased in size and is no longer hypermetabolic. 2. New hypermetabolic bandlike opacities throughout the right lung with a perihilar distribution, most consistent with radiation pneumonitis. Infection considered less likely. Attention on follow-up recommended. 3. No definite signs of metastatic disease. A mildly hypermetabolic subcarinal lymph node is similar to the prior study and likely reactive. 4. Stable right adrenal nodule with low level metabolic activity, likely an adenoma based on long-term stability from previous CTs dating back to 10/14/2003. 5. Aortic Atherosclerosis (ICD10-I70.0) and Emphysema (ICD10-J43.9). Electronically Signed   By: Elsie Perone M.D.   On: 09/30/2022 15:09   CUP PACEART REMOTE DEVICE CHECK  Result Date: 09/29/2022 Scheduled remote reviewed. Normal device function.  The device estimates 10.4 months until ERI There was one atrial arrhythmia detected that was a short burst of noise There was one 30 second NSVT that 122 bpm and appear to be ST and not VT Next remote 91 days. Benjamin Idell Flor, RN, CCDS, [employer]     02/13/2023 PET scan   NM PET Image Restage (PS) Skull Base to Thigh (F-18 FDG) Result Date: 02/16/2023 CLINICAL DATA:  Subsequent treatment strategy for non-small cell lung cancer. EXAM: NUCLEAR MEDICINE PET SKULL BASE TO THIGH TECHNIQUE: 7.2 mCi F-18 FDG was injected intravenously. Full-ring PET imaging was performed from the skull base to thigh after the radiotracer. CT data was obtained and used for attenuation correction and anatomic localization. Fasting blood glucose: 114 mg/dl COMPARISON:  91/77/7975 FINDINGS: Mediastinal blood pool activity: SUV max 2.7 Liver activity: SUV max NA NECK: No significant abnormal hypermetabolic activity in this region. Incidental  CT findings: Bilateral common carotid atheromatous vascular calcification. CHEST: Substantial reduction in the hypermetabolic activity  associated with treatment related radiation pneumonitis. Activity along the remaining radiation fibrosis/radiation pneumonitis in the vicinity of the previous tumor has a maximum SUV of 4.0. No differentially increased activity is currently visible at the site of the original tumor, which has markedly decreased in size compared to the prior exam appearance compatible with effective therapy. 9 mm subcarinal lymph node maximum SUV 3.6, formerly 3.3. Incidental CT findings: Coronary, aortic arch, and branch vessel atherosclerotic vascular disease. Pacer noted. Mild cardiomegaly. Prominent centrilobular emphysema. Biapical pleuroparenchymal scarring. Cylindrical bronchiectasis with airway plugging in the left lower lobe. ABDOMEN/PELVIS: No significant abnormal hypermetabolic activity in this region. Incidental CT findings: Atherosclerosis is present, including aortoiliac atherosclerotic disease. Infrarenal abdominal aortic aneurysm traversed by aorta bi-iliac stent graft. Right adrenal mass is not hypermetabolic and appears stable, likely adenoma although indeterminate density. Sigmoid colon diverticulosis. Marked prostatomegaly substantially protruding into the bladder. Small bilateral groin hernias are present containing adipose tissue and, on the left side, a small amount of small bowel. SKELETON: No significant abnormal hypermetabolic activity in this region. Incidental CT findings: Lower lumbar spondylosis and degenerative disc disease. IMPRESSION: 1. Substantial reduction in the hypermetabolic activity associated with treatment related radiation pneumonitis. Activity along the remaining radiation fibrosis/radiation pneumonitis in the vicinity of the previous tumor has a maximum SUV of 4.0, which is similar to the activity in the radiation pneumonitis elsewhere. No differentially  increased activity is currently visible at the site of the original tumor, which has markedly decreased in size compared to the prior exam appearance compatible with effective therapy. 2. 9 mm subcarinal lymph node maximum SUV 3.6, formerly 3.3. This is nonspecific and may be reactive. 3. Marked prostatomegaly substantially protruding into the bladder. 4. Small bilateral groin hernias containing adipose tissue and, on the left side, a small amount of small bowel. 5. Sigmoid colon diverticulosis. 6. Infrarenal abdominal aortic aneurysm traversed by aorta bi-iliac stent graft. 7. Coronary, aortic arch, and branch vessel atherosclerotic vascular disease. 8. Cylindrical bronchiectasis with airway plugging in the left lower lobe. 9. Prominent centrilobular emphysema. 10. Lower lumbar spondylosis and degenerative disc disease. Aortic Atherosclerosis (ICD10-I70.0) and Emphysema (ICD10-J43.9). Electronically Signed   By: Ryan Salvage M.D.   On: 02/16/2023 20:35   CUP PACEART REMOTE DEVICE CHECK Result Date: 01/22/2023 Scheduled remote reviewed. Normal device function.  5 AHR detections, longest lasted 16 seconds, EGMs demonstrate atrial lead noise.  Within the monitoring period, HF diagnostics have been abnormal.  Battery estimated 6.9 months remaining longevity. Routing to triage to initiate IFU for battery estimate 6 months. - CS, [facility]       PHYSICAL EXAMINATION: ECOG PERFORMANCE STATUS: 2 - Symptomatic, <50% confined to bed  Vitals:   02/18/23 1233  BP: (!) 140/91  Pulse: 89  Resp: 18  Temp: 99.5 F (37.5 C)  SpO2: 98%   Filed Weights   02/18/23 1233  Weight: 145 lb 9.6 oz (66 kg)    GENERAL:alert, no distress and comfortable  LABORATORY DATA:  I have reviewed the data as listed    Component Value Date/Time   NA 140 01/21/2023 0938   NA 138 04/15/2022 0913   K 3.4 (L) 01/21/2023 0938   CL 106 01/21/2023 0938   CO2 27 01/21/2023 0938   GLUCOSE 99 01/21/2023 0938   BUN 11  01/21/2023 0938   BUN 7 (L) 04/15/2022 0913   CREATININE 0.99 01/21/2023 0938   CREATININE 0.88 09/15/2015 0959   CALCIUM  8.9 01/21/2023 0938   PROT 6.4 (L) 01/21/2023 9061  PROT 6.8 11/07/2020 1303   ALBUMIN 3.7 01/21/2023 0938   ALBUMIN 3.6 (L) 11/07/2020 1303   AST 31 01/21/2023 0938   ALT 24 01/21/2023 0938   ALKPHOS 63 01/21/2023 0938   BILITOT 0.6 01/21/2023 0938   GFRNONAA >60 01/21/2023 0938   GFRAA 96 03/17/2020 1016    No results found for: SPEP, UPEP  Lab Results  Component Value Date   WBC 6.9 02/18/2023   NEUTROABS 4.8 02/18/2023   HGB 13.7 02/18/2023   HCT 40.7 02/18/2023   MCV 93.6 02/18/2023   PLT 222 02/18/2023      Chemistry      Component Value Date/Time   NA 140 01/21/2023 0938   NA 138 04/15/2022 0913   K 3.4 (L) 01/21/2023 0938   CL 106 01/21/2023 0938   CO2 27 01/21/2023 0938   BUN 11 01/21/2023 0938   BUN 7 (L) 04/15/2022 0913   CREATININE 0.99 01/21/2023 0938   CREATININE 0.88 09/15/2015 0959      Component Value Date/Time   CALCIUM  8.9 01/21/2023 0938   ALKPHOS 63 01/21/2023 0938   AST 31 01/21/2023 0938   ALT 24 01/21/2023 0938   BILITOT 0.6 01/21/2023 0938       RADIOGRAPHIC STUDIES: I have personally reviewed the radiological images as listed and agreed with the findings in the report. NM PET Image Restage (PS) Skull Base to Thigh (F-18 FDG) Result Date: 02/16/2023 CLINICAL DATA:  Subsequent treatment strategy for non-small cell lung cancer. EXAM: NUCLEAR MEDICINE PET SKULL BASE TO THIGH TECHNIQUE: 7.2 mCi F-18 FDG was injected intravenously. Full-ring PET imaging was performed from the skull base to thigh after the radiotracer. CT data was obtained and used for attenuation correction and anatomic localization. Fasting blood glucose: 114 mg/dl COMPARISON:  91/77/7975 FINDINGS: Mediastinal blood pool activity: SUV max 2.7 Liver activity: SUV max NA NECK: No significant abnormal hypermetabolic activity in this region. Incidental  CT findings: Bilateral common carotid atheromatous vascular calcification. CHEST: Substantial reduction in the hypermetabolic activity associated with treatment related radiation pneumonitis. Activity along the remaining radiation fibrosis/radiation pneumonitis in the vicinity of the previous tumor has a maximum SUV of 4.0. No differentially increased activity is currently visible at the site of the original tumor, which has markedly decreased in size compared to the prior exam appearance compatible with effective therapy. 9 mm subcarinal lymph node maximum SUV 3.6, formerly 3.3. Incidental CT findings: Coronary, aortic arch, and branch vessel atherosclerotic vascular disease. Pacer noted. Mild cardiomegaly. Prominent centrilobular emphysema. Biapical pleuroparenchymal scarring. Cylindrical bronchiectasis with airway plugging in the left lower lobe. ABDOMEN/PELVIS: No significant abnormal hypermetabolic activity in this region. Incidental CT findings: Atherosclerosis is present, including aortoiliac atherosclerotic disease. Infrarenal abdominal aortic aneurysm traversed by aorta bi-iliac stent graft. Right adrenal mass is not hypermetabolic and appears stable, likely adenoma although indeterminate density. Sigmoid colon diverticulosis. Marked prostatomegaly substantially protruding into the bladder. Small bilateral groin hernias are present containing adipose tissue and, on the left side, a small amount of small bowel. SKELETON: No significant abnormal hypermetabolic activity in this region. Incidental CT findings: Lower lumbar spondylosis and degenerative disc disease. IMPRESSION: 1. Substantial reduction in the hypermetabolic activity associated with treatment related radiation pneumonitis. Activity along the remaining radiation fibrosis/radiation pneumonitis in the vicinity of the previous tumor has a maximum SUV of 4.0, which is similar to the activity in the radiation pneumonitis elsewhere. No differentially  increased activity is currently visible at the site of the original tumor, which has markedly decreased  in size compared to the prior exam appearance compatible with effective therapy. 2. 9 mm subcarinal lymph node maximum SUV 3.6, formerly 3.3. This is nonspecific and may be reactive. 3. Marked prostatomegaly substantially protruding into the bladder. 4. Small bilateral groin hernias containing adipose tissue and, on the left side, a small amount of small bowel. 5. Sigmoid colon diverticulosis. 6. Infrarenal abdominal aortic aneurysm traversed by aorta bi-iliac stent graft. 7. Coronary, aortic arch, and branch vessel atherosclerotic vascular disease. 8. Cylindrical bronchiectasis with airway plugging in the left lower lobe. 9. Prominent centrilobular emphysema. 10. Lower lumbar spondylosis and degenerative disc disease. Aortic Atherosclerosis (ICD10-I70.0) and Emphysema (ICD10-J43.9). Electronically Signed   By: Ryan Salvage M.D.   On: 02/16/2023 20:35   CUP PACEART REMOTE DEVICE CHECK Result Date: 01/22/2023 Scheduled remote reviewed. Normal device function.  5 AHR detections, longest lasted 16 seconds, EGMs demonstrate atrial lead noise.  Within the monitoring period, HF diagnostics have been abnormal.  Battery estimated 6.9 months remaining longevity. Routing to triage to initiate IFU for battery estimate 6 months. - CS, [facility]

## 2023-02-19 LAB — T4: T4, Total: 13.2 ug/dL — ABNORMAL HIGH (ref 4.5–12.0)

## 2023-02-26 ENCOUNTER — Ambulatory Visit: Payer: Medicare HMO | Admitting: Vascular Surgery

## 2023-03-03 NOTE — Progress Notes (Signed)
Remote ICD transmission.

## 2023-03-10 ENCOUNTER — Encounter (HOSPITAL_COMMUNITY): Payer: Self-pay | Admitting: Emergency Medicine

## 2023-03-10 ENCOUNTER — Emergency Department (HOSPITAL_COMMUNITY): Payer: Medicare HMO

## 2023-03-10 ENCOUNTER — Emergency Department (HOSPITAL_COMMUNITY)
Admission: EM | Admit: 2023-03-10 | Discharge: 2023-03-11 | Disposition: A | Discharge status: Home / Self Care | Payer: Medicare HMO | Attending: Emergency Medicine | Admitting: Emergency Medicine

## 2023-03-10 DIAGNOSIS — M545 Low back pain, unspecified: Secondary | ICD-10-CM | POA: Insufficient documentation

## 2023-03-10 DIAGNOSIS — R0789 Other chest pain: Secondary | ICD-10-CM | POA: Diagnosis present

## 2023-03-10 DIAGNOSIS — Z7901 Long term (current) use of anticoagulants: Secondary | ICD-10-CM | POA: Insufficient documentation

## 2023-03-10 DIAGNOSIS — R079 Chest pain, unspecified: Secondary | ICD-10-CM

## 2023-03-10 DIAGNOSIS — Z85118 Personal history of other malignant neoplasm of bronchus and lung: Secondary | ICD-10-CM | POA: Insufficient documentation

## 2023-03-10 DIAGNOSIS — Z79899 Other long term (current) drug therapy: Secondary | ICD-10-CM | POA: Diagnosis not present

## 2023-03-10 DIAGNOSIS — M549 Dorsalgia, unspecified: Secondary | ICD-10-CM

## 2023-03-10 LAB — BASIC METABOLIC PANEL
Anion gap: 10 (ref 5–15)
BUN: 10 mg/dL (ref 8–23)
CO2: 25 mmol/L (ref 22–32)
Calcium: 9 mg/dL (ref 8.9–10.3)
Chloride: 106 mmol/L (ref 98–111)
Creatinine, Ser: 1.38 mg/dL — ABNORMAL HIGH (ref 0.61–1.24)
GFR, Estimated: 52 mL/min — ABNORMAL LOW (ref >60)
Glucose, Bld: 105 mg/dL — ABNORMAL HIGH (ref 70–99)
Potassium: 3.8 mmol/L (ref 3.5–5.1)
Sodium: 141 mmol/L (ref 135–145)

## 2023-03-10 LAB — CBC
HCT: 43.9 % (ref 39.0–52.0)
Hemoglobin: 14.4 g/dL (ref 13.0–17.0)
MCH: 31.2 pg (ref 26.0–34.0)
MCHC: 32.8 g/dL (ref 30.0–36.0)
MCV: 95.2 fL (ref 80.0–100.0)
Platelets: 181 10*3/uL (ref 150–400)
RBC: 4.61 MIL/uL (ref 4.22–5.81)
RDW: 15.6 % — ABNORMAL HIGH (ref 11.5–15.5)
WBC: 8.2 10*3/uL (ref 4.0–10.5)
nRBC: 0 % (ref 0.0–0.2)

## 2023-03-10 LAB — TROPONIN I (HIGH SENSITIVITY): Troponin I (High Sensitivity): 25 ng/L — ABNORMAL HIGH (ref <18)

## 2023-03-10 NOTE — ED Provider Triage Note (Signed)
Emergency Medicine Provider Triage Evaluation Note  Benjamin Cook , a 78 y.o. male  was evaluated in triage.  Pt complains of R sided chest pain and lower back pain since earlier today after picking up a wooden log from the back of his wood stove. Also having lower back pain which is chronic for him. Hx of lung cancer  Review of Systems  Positive: R sided chest pain Negative: SOB, abd pain, N/V/D  Physical Exam  There were no vitals taken for this visit. Gen:   Awake, no distress   Resp:  Normal effort  MSK:   Moves extremities without difficulty  Other:    Medical Decision Making  Medically screening exam initiated at 9:07 PM.  Appropriate orders placed.  [name] was informed that the remainder of the evaluation will be completed by another provider, this initial triage assessment does not replace that evaluation, and the importance of remaining in the ED until their evaluation is complete.     Su Monks, Cordelia Poche 03/10/23 2107

## 2023-03-10 NOTE — ED Triage Notes (Addendum)
Pt having R sided pain when he rolls to side. Says he picked up a wooden log from the back of his fireplace and pain started after that. Reports lower back -pain that is chronic. Pt states he came in for pain.

## 2023-03-11 LAB — TROPONIN I (HIGH SENSITIVITY): Troponin I (High Sensitivity): 25 ng/L — ABNORMAL HIGH (ref <18)

## 2023-03-11 MED ORDER — ACETAMINOPHEN 325 MG PO TABS
650.0000 mg | ORAL_TABLET | Freq: Once | ORAL | Status: AC
  Administered 2023-03-11: 650 mg via ORAL
  Filled 2023-03-11: qty 2

## 2023-03-11 MED ORDER — CYCLOBENZAPRINE HCL 5 MG PO TABS: 5.0000 mg | ORAL_TABLET | Freq: Two times a day (BID) | ORAL | 0 refills | Status: DC | PRN

## 2023-03-11 NOTE — ED Provider Notes (Signed)
 [place] EMERGENCY DEPARTMENT AT [facility] Provider Note   CSN: [id number] Arrival date & time: 03/10/23  1953     History  Chief Complaint  Patient presents with   Chest Pain    Benjamin Cook is a 78 y.o. male.  Patient here with right-sided chest wall pain, low back pain since yesterday now improved.  He was doing some manual labor things and might of pulled a muscle he thinks.  He has a history of lung cancer, high cholesterol A-fib a flutter on Xarelto .  He is feeling much better now.  States that he has having a hard time twisting and moving without discomfort.  Denies any cough or sputum production.  Denies any exertional symptoms.  Denies any weakness numbness tingling.  No loss of bowel or bladder.  History of chronic back pain.  Uses lidocaine  patches.  The history is provided by the patient.       Home Medications Prior to Admission medications   Medication Sig Start Date End Date Taking? Authorizing Provider  cyclobenzaprine  (FLEXERIL ) 5 MG tablet Take 1 tablet (5 mg total) by mouth 2 (two) times daily as needed for up to 10 doses for muscle spasms. 03/11/23  Yes Maxime Beckner, DO  acetaminophen  (TYLENOL ) 500 MG tablet Take 500-1,000 mg by mouth every 8 (eight) hours as needed (for pain).     [provider]  amiodarone  (PACERONE ) 400 MG tablet Take 0.5 tablets (200 mg total) by mouth 2 (two) times daily. 11/13/22   Pietro Redell RAMAN, MD  atorvastatin  (LIPITOR ) 80 MG tablet TAKE 1 TABLET BY MOUTH  DAILY AT 6 PM 01/27/23   Pietro Redell RAMAN, MD  Chlorpheniramine Maleate (ALLERGY RELIEF PO) Take 1 tablet by mouth daily as needed (allergies).    [provider]  empagliflozin  (JARDIANCE ) 10 MG TABS tablet Take 1 tablet (10 mg total) by mouth daily before breakfast. 11/13/22   Pietro Redell RAMAN, MD  furosemide  (LASIX ) 40 MG tablet Take 0.5 tablets (20 mg total) by mouth daily. 11/13/22   Pietro Redell RAMAN, MD  gabapentin  (NEURONTIN ) 100 MG  capsule Take 1 capsule (100 mg total) by mouth at bedtime. 11/05/22   Dameron, Marisa, DO  lidocaine -prilocaine  (EMLA ) cream Apply 1 Application topically as needed. 12/24/22   Lonn Hicks, MD  losartan  (COZAAR ) 25 MG tablet Take 0.5 tablets (12.5 mg total) by mouth daily. 11/13/22   Colletta Manuelita Garre, PA-C  melatonin 5 MG TABS Take 1 tablet (5 mg total) by mouth at bedtime. 10/16/20   Odell Celinda Balo, MD  metoprolol  succinate (TOPROL -XL) 25 MG 24 hr tablet Take 0.5 tablets (12.5 mg total) by mouth 2 (two) times daily. 11/13/22   Pietro Redell RAMAN, MD  mometasone -formoterol  (DULERA ) 100-5 MCG/ACT AERO Inhale 2 puffs into the lungs in the morning and at bedtime. 01/31/22   Allred, Lynwood, MD  Multiple Vitamin (MULTIVITAMIN) capsule Take 1 capsule by mouth daily.    [provider]  ondansetron  (ZOFRAN ) 8 MG tablet Take 1 tablet (8 mg total) by mouth every 8 (eight) hours as needed for nausea. 07/12/22   Lonn Hicks, MD  pantoprazole  (PROTONIX ) 40 MG tablet Take 40 mg by mouth daily. 06/07/22   [provider]  Rivaroxaban  (XARELTO ) 15 MG TABS tablet Take 1 tablet (15 mg total) by mouth daily with supper. 11/13/22   Pietro Redell RAMAN, MD  senna-docusate (SENOKOT-S) 8.6-50 MG tablet Take 2 tablets by mouth daily. 07/12/22   Lonn Hicks, MD  spironolactone  (  ALDACTONE ) 25 MG tablet Take 0.5 tablets (12.5 mg total) by mouth daily. 11/13/22   Pietro Redell RAMAN, MD      Allergies    Paclitaxel     Review of Systems   Review of Systems  Physical Exam Updated Vital Signs  ED Triage Vitals [03/10/23 2204]  Encounter Vitals Group     BP 126/64     Systolic BP Percentile      Diastolic BP Percentile      Pulse Rate 73     Resp 18     Temp 98.3 F (36.8 C)     Temp Source Oral     SpO2 99 %     Weight      Height      Head Circumference      Peak Flow      Pain Score      Pain Loc      Pain Education      Exclude from Growth Chart     Physical Exam Vitals and nursing note  reviewed.  Constitutional:      General: He is not in acute distress.    Appearance: He is well-developed.  HENT:     Head: Normocephalic and atraumatic.  Eyes:     Extraocular Movements: Extraocular movements intact.     Conjunctiva/sclera: Conjunctivae normal.     Pupils: Pupils are equal, round, and reactive to light.  Cardiovascular:     Rate and Rhythm: Normal rate and regular rhythm.     Heart sounds: Normal heart sounds. No murmur heard. Pulmonary:     Effort: Pulmonary effort is normal. No respiratory distress.     Breath sounds: Normal breath sounds.  Abdominal:     Palpations: Abdomen is soft.     Tenderness: There is no abdominal tenderness.  Musculoskeletal:        General: No swelling. Normal range of motion.     Cervical back: Normal range of motion and neck supple.     Comments: Tenderness to the anterior chest wall on the right, tenderness to the bilateral lumbar muscles in the paraspinal region but no midline spinal tenderness  Skin:    General: Skin is warm and dry.     Capillary Refill: Capillary refill takes less than 2 seconds.  Neurological:     General: No focal deficit present.     Mental Status: He is alert and oriented to person, place, and time.     Cranial Nerves: No cranial nerve deficit.     Motor: No weakness.     Comments: 5+ out of 5 strength throughout, normal sensation, no drift,normal speech  Psychiatric:        Mood and Affect: Mood normal.     ED Results / Procedures / Treatments   Labs (all labs ordered are listed, but only abnormal results are displayed) Labs Reviewed  BASIC METABOLIC PANEL - Abnormal; Notable for the following components:      Result Value   Glucose, Bld 105 (*)    Creatinine, Ser 1.38 (*)    GFR, Estimated 52 (*)    All other components within normal limits  CBC - Abnormal; Notable for the following components:   RDW 15.6 (*)    All other components within normal limits  TROPONIN I (HIGH SENSITIVITY) -  Abnormal; Notable for the following components:   Troponin I (High Sensitivity) 25 (*)    All other components within normal limits  TROPONIN I (HIGH SENSITIVITY) -  Abnormal; Notable for the following components:   Troponin I (High Sensitivity) 25 (*)    All other components within normal limits    EKG EKG Interpretation Date/Time:  Monday March 10 2023 21:05:49 EST Ventricular Rate:  78 PR Interval:    QRS Duration:  136 QT Interval:  446 QTC Calculation: 508 R Axis:   -89  Text Interpretation: Normal sinus rhythm Left axis deviation Right bundle branch block Possible Lateral infarct , age undetermined Inferior infarct , age undetermined Abnormal ECG When compared with ECG of 13-Nov-2022 10:31, PREVIOUS ECG IS PRESENT Confirmed by Ruthe Cornet [phone]) on 03/11/2023 7:34:24 AM  Radiology DG Chest 2 View Result Date: 03/10/2023 CLINICAL DATA:  Initial evaluation for acute chest pain. EXAM: CHEST - 2 VIEW COMPARISON:  Comparison made with prior PET-CT from 02/13/2023 and radiograph from 11/03/2022 FINDINGS: Left-sided transvenous pacemaker/AICD in place. Right-sided Port-A-Cath in place. Transverse heart size stable, and remains within normal limits. Aortic atherosclerosis noted. Emphysema noted. Asymmetric prominence of the right hilum with retractile fibrosis and/or scarring within the adjacent right perihilar region, consistent with post treatment changes, also seen on prior PET-CT. Linear subsegmental atelectasis noted at the left lung base. No focal infiltrates. No pulmonary edema or significant pleural effusion. No pneumothorax. Visualized osseous structures and soft tissues demonstrate no acute finding. IMPRESSION: 1. No radiographic evidence for active cardiopulmonary disease. 2. Post treatment changes within the right hilum/perihilar region. 3. Aortic Atherosclerosis (ICD10-I70.0) and Emphysema (ICD10-J43.9). Electronically Signed   By: Morene Hoard M.D.   On: 03/10/2023 21:22     Procedures Procedures    Medications Ordered in ED Medications  acetaminophen  (TYLENOL ) tablet 650 mg (650 mg Oral Given 03/11/23 9182)    ED Course/ Medical Decision Making/ A&P                                 Medical Decision Making Risk OTC drugs. Prescription drug management.   Benjamin [facility] is here with chest wall and back pain.  Started last night after doing some manual labor.  He has a history of chronic back pain history of lung cancer.  He is on Xarelto .  History of A-fib COPD.  History of AAA s/p repair.  He has reproducible pain on exam.  Pain to his right anterior ribs, low back.  No midline spinal.  Denies any abdominal pain exertional pain chest pain other than when he is twisting and moving.  He denies any nausea vomit diarrhea.  He is feeling much better now.  He can twist turn without much discomfort.  He is neurovascular neuromuscular intact on exam.  Good good pulses throughout.  I have no concern for AAA or other major acute pulmonary cardiac process but he is already had basic labs and troponin EKG and chest x-ray done prior to my evaluation.  Differential diagnosis likely muscle spasm, MSK pain but were able to evaluate for ACS infectious.  Lab work per my review interpretation unremarkable.  Troponin stable in the 20s x 2 which is baseline.  EKG shows sinus rhythm with bundle branch.  No ischemic changes.  Creatinine 1.3 but otherwise lab works unremarkable.  Chest x-ray with no evidence of pneumonia or pneumothorax.  I reviewed interpreted labs and imaging.  Overall he is improving.  He is given Tylenol .  Will prescribe Flexeril .  Recommend continued use of the pain patches/lidocaine  patches.  Follow-up with his primary care doctor.  Understands  return precautions.  Discharged in good condition.  This chart was dictated using voice recognition software.  Despite best efforts to proofread,  errors can occur which can change the documentation meaning.          Final Clinical Impression(s) / ED Diagnoses Final diagnoses:  Acute back pain, unspecified back location, unspecified back pain laterality  Chest wall pain  Nonspecific chest pain    Rx / DC Orders ED Discharge Orders          Ordered    cyclobenzaprine  (FLEXERIL ) 5 MG tablet  2 times daily PRN        03/11/23 0751              Ruthe Cornet, DO 03/11/23 0825

## 2023-03-11 NOTE — Discharge Instructions (Signed)
I have prescribed you Flexeril.  This medication is mildly sedating so please be careful with its use.  This is a muscle relaxant.  Continue your pain patches as well.  Continue Tylenol.  Follow-up with your primary care doctor for further management.

## 2023-03-18 ENCOUNTER — Inpatient Hospital Stay: Payer: Medicare HMO | Attending: Hematology and Oncology

## 2023-03-18 ENCOUNTER — Encounter: Payer: Self-pay | Admitting: Hematology and Oncology

## 2023-03-18 ENCOUNTER — Inpatient Hospital Stay: Payer: Medicare HMO

## 2023-03-18 ENCOUNTER — Inpatient Hospital Stay (HOSPITAL_BASED_OUTPATIENT_CLINIC_OR_DEPARTMENT_OTHER): Payer: Medicare HMO | Admitting: Hematology and Oncology

## 2023-03-18 VITALS — BP 134/77 | HR 73 | Temp 98.1°F | Resp 18 | Ht 70.0 in | Wt 144.6 lb

## 2023-03-18 DIAGNOSIS — Z5112 Encounter for antineoplastic immunotherapy: Secondary | ICD-10-CM | POA: Insufficient documentation

## 2023-03-18 DIAGNOSIS — R0789 Other chest pain: Secondary | ICD-10-CM

## 2023-03-18 DIAGNOSIS — C3491 Malignant neoplasm of unspecified part of right bronchus or lung: Secondary | ICD-10-CM

## 2023-03-18 DIAGNOSIS — C3490 Malignant neoplasm of unspecified part of unspecified bronchus or lung: Secondary | ICD-10-CM | POA: Diagnosis present

## 2023-03-18 DIAGNOSIS — Z79899 Other long term (current) drug therapy: Secondary | ICD-10-CM | POA: Insufficient documentation

## 2023-03-18 LAB — CBC WITH DIFFERENTIAL (CANCER CENTER ONLY)
Abs Immature Granulocytes: 0.02 10*3/uL (ref 0.00–0.07)
Basophils Absolute: 0 10*3/uL (ref 0.0–0.1)
Basophils Relative: 1 %
Eosinophils Absolute: 0.2 10*3/uL (ref 0.0–0.5)
Eosinophils Relative: 2 %
HCT: 41.1 % (ref 39.0–52.0)
Hemoglobin: 13.3 g/dL (ref 13.0–17.0)
Immature Granulocytes: 0 %
Lymphocytes Relative: 12 %
Lymphs Abs: 0.8 10*3/uL (ref 0.7–4.0)
MCH: 30.5 pg (ref 26.0–34.0)
MCHC: 32.4 g/dL (ref 30.0–36.0)
MCV: 94.3 fL (ref 80.0–100.0)
Monocytes Absolute: 0.8 10*3/uL (ref 0.1–1.0)
Monocytes Relative: 13 %
Neutro Abs: 4.6 10*3/uL (ref 1.7–7.7)
Neutrophils Relative %: 72 %
Platelet Count: 177 10*3/uL (ref 150–400)
RBC: 4.36 MIL/uL (ref 4.22–5.81)
RDW: 15.3 % (ref 11.5–15.5)
WBC Count: 6.4 10*3/uL (ref 4.0–10.5)
nRBC: 0 % (ref 0.0–0.2)

## 2023-03-18 LAB — CMP (CANCER CENTER ONLY)
ALT: 40 U/L (ref 0–44)
AST: 43 U/L — ABNORMAL HIGH (ref 15–41)
Albumin: 3.7 g/dL (ref 3.5–5.0)
Alkaline Phosphatase: 76 U/L (ref 38–126)
Anion gap: 6 (ref 5–15)
BUN: 15 mg/dL (ref 8–23)
CO2: 27 mmol/L (ref 22–32)
Calcium: 8.8 mg/dL — ABNORMAL LOW (ref 8.9–10.3)
Chloride: 106 mmol/L (ref 98–111)
Creatinine: 1.12 mg/dL (ref 0.61–1.24)
GFR, Estimated: >60 mL/min (ref >60)
Glucose, Bld: 128 mg/dL — ABNORMAL HIGH (ref 70–99)
Potassium: 3.5 mmol/L (ref 3.5–5.1)
Sodium: 139 mmol/L (ref 135–145)
Total Bilirubin: 0.6 mg/dL (ref 0.0–1.2)
Total Protein: 6.8 g/dL (ref 6.5–8.1)

## 2023-03-18 LAB — TSH: TSH: 1.09 u[IU]/mL (ref 0.350–4.500)

## 2023-03-18 MED ORDER — SODIUM CHLORIDE 0.9% FLUSH
10.0000 mL | Freq: Once | INTRAVENOUS | Status: AC | PRN
  Administered 2023-03-18: 10 mL

## 2023-03-18 MED ORDER — CYCLOBENZAPRINE HCL 5 MG PO TABS: 5.0000 mg | ORAL_TABLET | Freq: Two times a day (BID) | ORAL | 0 refills | Status: DC | PRN

## 2023-03-18 MED ORDER — HEPARIN SOD (PORK) LOCK FLUSH 100 UNIT/ML IV SOLN
500.0000 [IU] | Freq: Once | INTRAVENOUS | Status: AC | PRN
  Administered 2023-03-18: 500 [IU]

## 2023-03-18 MED ORDER — SODIUM CHLORIDE 0.9 % IV SOLN
1500.0000 mg | Freq: Once | INTRAVENOUS | Status: AC
  Administered 2023-03-18: 1500 mg via INTRAVENOUS
  Filled 2023-03-18: qty 30

## 2023-03-18 MED ORDER — SODIUM CHLORIDE 0.9 % IV SOLN: Freq: Once | INTRAVENOUS | Status: AC

## 2023-03-18 MED ORDER — SODIUM CHLORIDE 0.9% FLUSH
10.0000 mL | INTRAVENOUS | Status: DC | PRN
  Administered 2023-03-18: 10 mL

## 2023-03-18 NOTE — Assessment & Plan Note (Signed)
PET/CT imaging from January 2025 showed excellent response to therapy with no signs of cancer recurrence He will continue maintenance immunotherapy I plan to repeat imaging study again in July

## 2023-03-18 NOTE — Progress Notes (Signed)
[facility] OFFICE PROGRESS NOTE  Patient Care Team: [name], [name], [name], [name]  ASSESSMENT & PLAN:  Non-small cell lung cancer [facility]) PET/CT imaging from January 2025 showed excellent response to therapy with no signs of cancer recurrence He will continue maintenance immunotherapy I plan to repeat imaging study again in July  Atypical chest pain He has recent ER evaluation for atypical chest pain Cardiac enzymes are borderline elevated Chest x-ray showed no evidence of abnormalities except for persistent changes related to his radiation His pain is not reproducible on palpation of his chest wall I recommend the patient to reach out to his cardiologist It appears that he was prescribed Flexeril and that seems to alleviate some of his discomfort, indicating that could be a musculoskeletal component I refilled his prescription today  No orders of the defined types were placed in this encounter.   All questions were answered. The patient knows to call the clinic with any problems, questions or concerns. The total time spent in the appointment was 30 minutes encounter with patients including review of chart and various tests results, discussions about plan of care and coordination of care plan   Artis Delay, MD 03/18/2023 12:44 PM  INTERVAL HISTORY: Please see below for problem oriented charting. he returns for chemo follow-up He was evaluated in the emergency department recently due to chest pain Cardiac enzymes, chest x-ray and other test did not reveal evidence of cardiac ischemia He was discharged with Flexeril It seems to help with his pain somewhat but he still have intermittent discomfort Denies diaphoresis or shortness of breath I reviewed his workup from emergency room I do not believe his pain is related to his  current treatment  REVIEW OF SYSTEMS:   Constitutional: Denies fevers, chills or abnormal weight loss Eyes: Denies blurriness of vision Ears, nose, mouth, throat, and face: Denies mucositis or sore throat Gastrointestinal:  Denies nausea, heartburn or change in bowel habits Skin: Denies abnormal skin rashes Lymphatics: Denies new lymphadenopathy or easy bruising Neurological:Denies numbness, tingling or new weaknesses Behavioral/Psych: Mood is stable, no new changes  All other systems were reviewed with the patient and are negative.  I have reviewed the past medical history, past surgical history, social history and family history with the patient and they are unchanged from previous note.  ALLERGIES:  is allergic to paclitaxel.  MEDICATIONS:  Current Outpatient Medications  Medication Sig Dispense Refill   acetaminophen (TYLENOL) 500 MG tablet Take 500-1,000 mg by mouth every 8 (eight) hours as needed (for pain).      amiodarone (PACERONE) 400 MG tablet Take 0.5 tablets (200 mg total) by mouth 2 (two) times daily. 90 tablet 2   atorvastatin (LIPITOR) 80 MG tablet TAKE 1 TABLET BY MOUTH  DAILY AT 6 PM 90 tablet 3   Chlorpheniramine Maleate (ALLERGY RELIEF PO) Take 1 tablet by mouth daily as needed (allergies).     cyclobenzaprine (FLEXERIL) 5 MG tablet Take 1 tablet (5 mg total) by mouth 2 (two) times daily as needed for muscle spasms. 60 tablet 0   empagliflozin (JARDIANCE) 10 MG TABS tablet Take 1 tablet (10 mg total) by mouth daily before breakfast. 90 tablet 2   furosemide (LASIX) 40 MG tablet Take 0.5 tablets (20 mg total) by mouth daily. 45 tablet 2   gabapentin (NEURONTIN) 100 MG capsule Take 1 capsule (  100 mg total) by mouth at bedtime. 30 capsule 0   lidocaine-prilocaine (EMLA) cream Apply 1 Application topically as needed. 30 g 3   losartan (COZAAR) 25 MG tablet Take 0.5 tablets (12.5 mg total) by mouth daily. 45 tablet 3   melatonin 5 MG TABS Take 1 tablet (5 mg total) by  mouth at bedtime. 30 tablet 0   metoprolol succinate (TOPROL-XL) 25 MG 24 hr tablet Take 0.5 tablets (12.5 mg total) by mouth 2 (two) times daily. 90 tablet 2   mometasone-formoterol (DULERA) 100-5 MCG/ACT AERO Inhale 2 puffs into the lungs in the morning and at bedtime. 8.8 each 1   Multiple Vitamin (MULTIVITAMIN) capsule Take 1 capsule by mouth daily.     ondansetron (ZOFRAN) 8 MG tablet Take 1 tablet (8 mg total) by mouth every 8 (eight) hours as needed for nausea. 30 tablet 3   pantoprazole (PROTONIX) 40 MG tablet Take 40 mg by mouth daily.     Rivaroxaban (XARELTO) 15 MG TABS tablet Take 1 tablet (15 mg total) by mouth daily with supper. 30 tablet 5   senna-docusate (SENOKOT-S) 8.6-50 MG tablet Take 2 tablets by mouth daily. 60 tablet 1   spironolactone (ALDACTONE) 25 MG tablet Take 0.5 tablets (12.5 mg total) by mouth daily. 45 tablet 2   No current facility-administered medications for this visit.   Facility-Administered Medications Ordered in Other Visits  Medication Dose Route Frequency Provider Last Rate Last Admin   durvalumab (IMFINZI) 1,500 mg in sodium chloride 0.9 % 100 mL chemo infusion  1,500 mg Intravenous Once Bertis Ruddy, Helaman Mecca, MD       heparin lock flush 100 unit/mL  500 Units Intracatheter Once PRN Bertis Ruddy, Grasiela Jonsson, MD       sodium chloride flush (NS) 0.9 % injection 10 mL  10 mL Intracatheter PRN Artis Delay, MD        SUMMARY OF ONCOLOGIC HISTORY: Oncology History Overview Note  Foundation One test showed no actionable mutations. 6TMB, MSI low   Non-small cell lung cancer ([name])  06/11/2022 Initial Diagnosis   Non-small cell lung cancer ([name])   06/20/2022 Pathology Results   CYTOLOGY - NON PAP  CASE: [id number]  PATIENT: [facility] Cook  Non-Gynecological Cytology Report   Clinical History: Right hilar mass  Specimen Submitted:  A. LUNG, RIGHT HILAR MASS, FINE NEEDLE ASPIRATION:    FINAL MICROSCOPIC DIAGNOSIS:  - Malignant cells consistent with non-small cell  carcinoma  - See comment   SPECIMEN ADEQUACY:  Satisfactory for evaluation   DIAGNOSTIC COMMENTS:  The specimen contains atypical cells with malignant appearance characterized by nuclear enlargement, pleomorphism, clumped chromatin and occasional nucleoli. Immunohistochemical stains were attempted however no cells were present on the stains. The morphology does not support a small cell carcinoma with differential diagnosis including adenocarcinoma, large cell neuroendocrine carcinoma, or large cell carcinoma, et al. More diagnostic material is needed for further classification and molecular tests.    07/02/2022 PET scan   1. Right suprahilar hypermetabolic mass, consistent with primary lung malignancy. 2. Subcarinal hypermetabolic activity which is contiguous with right hilar mass, possibly due to mediastinal extension or subcarinal lymphadenopathy. 3. No evidence of metastatic disease in the abdomen or pelvis. 4. Right adrenal gland mass with low-level FDG uptake, unchanged when compared with prior exams dating back to 2018 and consistent with benign adenoma. 5. Hypermetabolic right paraspinal consolidation, likely infectious or inflammatory. 6. Aortic Atherosclerosis (ICD10-I70.0) and Emphysema (ICD10-J43.9).     07/02/2022 Cancer Staging   Staging form: Lung, AJCC 8th  Edition - Clinical stage from 07/02/2022: Stage IIIA (cT2, cN2, cM0) - Signed by Artis Delay, MD on 07/02/2022 Stage prefix: Initial diagnosis   07/09/2022 Procedure   Successful placement of a right internal jugular approach power injectable Port-A-Cath. The catheter is ready for immediate use.     07/15/2022 - 08/12/2022 Chemotherapy   Patient is on Treatment Plan : LUNG Carboplatin + Paclitaxel + XRT q7d     10/01/2022 -  Chemotherapy   Patient is on Treatment Plan : LUNG NSCLC Durvalumab (1500) q28d     10/01/2022 PET scan   NM PET Image Restage (PS) Skull Base to Thigh (F-18 FDG)  Result Date: 09/30/2022 CLINICAL DATA:   Subsequent treatment strategy for non-small cell lung cancer. EXAM: NUCLEAR MEDICINE PET SKULL BASE TO THIGH TECHNIQUE: 6.33 mCi F-18 FDG was injected intravenously. Full-ring PET imaging was performed from the skull base to thigh after the radiotracer. CT data was obtained and used for attenuation correction and anatomic localization. Fasting blood glucose: 89 mg/dl COMPARISON:  PET-CT 16/11/9602.  Chest CT 06/03/2022. FINDINGS: Mediastinal blood pool activity: SUV max 2.1 NECK: No hypermetabolic cervical lymph nodes are identified.Asymmetric activity within the left pterygoid musculature, likely physiologic. No suspicious activity identified within the pharyngeal mucosal space. Incidental CT findings: Prominent bilateral carotid atherosclerosis. CHEST: There is no residual hypermetabolic activity within the previously demonstrated right suprahilar mass which has significantly decreased in size in the interval. This currently measures approximately 2.5 x 2.0 cm on image 32/7 and has an SUV max of 3.5 (previously 23.4). Similar 1.2 cm short axis subcarinal node on image 71/4 within SUV max of 3.5 (previously 2.6). No other hypermetabolic mediastinal, hilar or axillary lymph nodes. There are new extensive bandlike opacities throughout the right lung with a perihilar distribution. These demonstrate hypermetabolic activity with an SUV max of 11.5 posteriorly. Based on distribution, these findings are most likely secondary to radiation pneumonitis. No abnormal metabolic activity or suspicious nodularity in the left lung. Incidental CT findings: Moderate to severe centrilobular emphysema with diffuse central airway thickening and scattered pulmonary scarring. Atherosclerosis of the aorta, great vessels and coronary arteries. Right IJ Port-A-Cath extends to the superior cavoatrial junction. Left subclavian pacemaker leads appear unchanged. ABDOMEN/PELVIS: There is no hypermetabolic activity within the liver, left adrenal  gland, spleen or pancreas. A right adrenal nodule with low level metabolic activity is unchanged. This measures 4.2 x 3.1 cm on image 107/4 and has an SUV max of 3.2. There is no hypermetabolic nodal activity in the abdomen or pelvis. Incidental CT findings: Diffuse aortic and branch vessel atherosclerosis status post aortoiliac endograft stenting. The excluded aneurysm measures approximately 4.5 cm in AP diameter, unchanged. Stable bilateral renal cysts for which no follow-up imaging is recommended. Stable marked enlargement of the prostate gland without hypermetabolic activity. SKELETON: There is no hypermetabolic activity to suggest osseous metastatic disease. Incidental CT findings: Multilevel spondylosis. IMPRESSION: 1. Significant interval response to therapy. The previously demonstrated right suprahilar mass has significantly decreased in size and is no longer hypermetabolic. 2. New hypermetabolic bandlike opacities throughout the right lung with a perihilar distribution, most consistent with radiation pneumonitis. Infection considered less likely. Attention on follow-up recommended. 3. No definite signs of metastatic disease. A mildly hypermetabolic subcarinal lymph node is similar to the prior study and likely reactive. 4. Stable right adrenal nodule with low level metabolic activity, likely an adenoma based on long-term stability from previous CTs dating back to 10/14/2003. 5. Aortic Atherosclerosis (ICD10-I70.0) and Emphysema (ICD10-J43.9). Electronically Signed  By: Carey Bullocks M.D.   On: 09/30/2022 15:09   CUP PACEART REMOTE DEVICE CHECK  Result Date: 09/29/2022 Scheduled remote reviewed. Normal device function.  The device estimates 10.4 months until ERI There was one atrial arrhythmia detected that was a short burst of noise There was one 30 second NSVT that 122 bpm and appear to be ST and not VT Next remote 91 days. Hassell Halim, RN, CCDS, [employer]     02/13/2023 PET scan   NM  PET Image Restage (PS) Skull Base to Thigh (F-18 FDG) Result Date: 02/16/2023 CLINICAL DATA:  Subsequent treatment strategy for non-small cell lung cancer. EXAM: NUCLEAR MEDICINE PET SKULL BASE TO THIGH TECHNIQUE: 7.2 mCi F-18 FDG was injected intravenously. Full-ring PET imaging was performed from the skull base to thigh after the radiotracer. CT data was obtained and used for attenuation correction and anatomic localization. Fasting blood glucose: 114 mg/dl COMPARISON:  16/11/9602 FINDINGS: Mediastinal blood pool activity: SUV max 2.7 Liver activity: SUV max NA NECK: No significant abnormal hypermetabolic activity in this region. Incidental CT findings: Bilateral common carotid atheromatous vascular calcification. CHEST: Substantial reduction in the hypermetabolic activity associated with treatment related radiation pneumonitis. Activity along the remaining radiation fibrosis/radiation pneumonitis in the vicinity of the previous tumor has a maximum SUV of 4.0. No differentially increased activity is currently visible at the site of the original tumor, which has markedly decreased in size compared to the prior exam appearance compatible with effective therapy. 9 mm subcarinal lymph node maximum SUV 3.6, formerly 3.3. Incidental CT findings: Coronary, aortic arch, and branch vessel atherosclerotic vascular disease. Pacer noted. Mild cardiomegaly. Prominent centrilobular emphysema. Biapical pleuroparenchymal scarring. Cylindrical bronchiectasis with airway plugging in the left lower lobe. ABDOMEN/PELVIS: No significant abnormal hypermetabolic activity in this region. Incidental CT findings: Atherosclerosis is present, including aortoiliac atherosclerotic disease. Infrarenal abdominal aortic aneurysm traversed by aorta bi-iliac stent graft. Right adrenal mass is not hypermetabolic and appears stable, likely adenoma although indeterminate density. Sigmoid colon diverticulosis. Marked prostatomegaly substantially  protruding into the bladder. Small bilateral groin hernias are present containing adipose tissue and, on the left side, a small amount of small bowel. SKELETON: No significant abnormal hypermetabolic activity in this region. Incidental CT findings: Lower lumbar spondylosis and degenerative disc disease. IMPRESSION: 1. Substantial reduction in the hypermetabolic activity associated with treatment related radiation pneumonitis. Activity along the remaining radiation fibrosis/radiation pneumonitis in the vicinity of the previous tumor has a maximum SUV of 4.0, which is similar to the activity in the radiation pneumonitis elsewhere. No differentially increased activity is currently visible at the site of the original tumor, which has markedly decreased in size compared to the prior exam appearance compatible with effective therapy. 2. 9 mm subcarinal lymph node maximum SUV 3.6, formerly 3.3. This is nonspecific and may be reactive. 3. Marked prostatomegaly substantially protruding into the bladder. 4. Small bilateral groin hernias containing adipose tissue and, on the left side, a small amount of small bowel. 5. Sigmoid colon diverticulosis. 6. Infrarenal abdominal aortic aneurysm traversed by aorta bi-iliac stent graft. 7. Coronary, aortic arch, and branch vessel atherosclerotic vascular disease. 8. Cylindrical bronchiectasis with airway plugging in the left lower lobe. 9. Prominent centrilobular emphysema. 10. Lower lumbar spondylosis and degenerative disc disease. Aortic Atherosclerosis (ICD10-I70.0) and Emphysema (ICD10-J43.9). Electronically Signed   By: Gaylyn Rong M.D.   On: 02/16/2023 20:35   CUP PACEART REMOTE DEVICE CHECK Result Date: 01/22/2023 Scheduled remote reviewed. Normal device function.  5 AHR detections, longest  lasted 16 seconds, EGMs demonstrate atrial lead noise.  Within the monitoring period, HF diagnostics have been abnormal.  Battery estimated 6.9 months remaining longevity. Routing  to triage to initiate IFU for battery estimate 6 months. - CS, [facility]       PHYSICAL EXAMINATION: ECOG PERFORMANCE STATUS: 1 - Symptomatic but completely ambulatory  Vitals:   03/18/23 1146  BP: 134/77  Pulse: 73  Resp: 18  Temp: 98.1 F (36.7 C)  SpO2: 98%   Filed Weights   03/18/23 1146  Weight: 144 lb 9.6 oz (65.6 kg)    GENERAL:alert, no distress and comfortable SKIN: skin color, texture, turgor are normal, no rashes or significant lesions EYES: normal, Conjunctiva are pink and non-injected, sclera clear OROPHARYNX:no exudate, no erythema and lips, buccal mucosa, and tongue normal  NECK: supple, thyroid normal size, non-tender, without nodularity LYMPH:  no palpable lymphadenopathy in the cervical, axillary or inguinal LUNGS: clear to auscultation and percussion with normal breathing effort HEART: regular rate & rhythm and no murmurs and no lower extremity edema ABDOMEN:abdomen soft, non-tender and normal bowel sounds Musculoskeletal:no cyanosis of digits and no clubbing  NEURO: alert & oriented x 3 with fluent speech, no focal motor/sensory deficits  LABORATORY DATA:  I have reviewed the data as listed    Component Value Date/Time   NA 139 03/18/2023 1121   NA 138 04/15/2022 0913   K 3.5 03/18/2023 1121   CL 106 03/18/2023 1121   CO2 27 03/18/2023 1121   GLUCOSE 128 (H) 03/18/2023 1121   BUN 15 03/18/2023 1121   BUN 7 (L) 04/15/2022 0913   CREATININE 1.12 03/18/2023 1121   CREATININE 0.88 09/15/2015 0959   CALCIUM 8.8 (L) 03/18/2023 1121   PROT 6.8 03/18/2023 1121   PROT 6.8 11/07/2020 1303   ALBUMIN 3.7 03/18/2023 1121   ALBUMIN 3.6 (L) 11/07/2020 1303   AST 43 (H) 03/18/2023 1121   ALT 40 03/18/2023 1121   ALKPHOS 76 03/18/2023 1121   BILITOT 0.6 03/18/2023 1121   GFRNONAA >60 03/18/2023 1121   GFRAA 96 03/17/2020 1016    No results found for: "SPEP", "UPEP"  Lab Results  Component Value Date   WBC 6.4 03/18/2023   NEUTROABS 4.6 03/18/2023    HGB 13.3 03/18/2023   HCT 41.1 03/18/2023   MCV 94.3 03/18/2023   PLT 177 03/18/2023      Chemistry      Component Value Date/Time   NA 139 03/18/2023 1121   NA 138 04/15/2022 0913   K 3.5 03/18/2023 1121   CL 106 03/18/2023 1121   CO2 27 03/18/2023 1121   BUN 15 03/18/2023 1121   BUN 7 (L) 04/15/2022 0913   CREATININE 1.12 03/18/2023 1121   CREATININE 0.88 09/15/2015 0959      Component Value Date/Time   CALCIUM 8.8 (L) 03/18/2023 1121   ALKPHOS 76 03/18/2023 1121   AST 43 (H) 03/18/2023 1121   ALT 40 03/18/2023 1121   BILITOT 0.6 03/18/2023 1121       RADIOGRAPHIC STUDIES: I have personally reviewed the radiological images as listed and agreed with the findings in the report. DG Chest 2 View Result Date: 03/10/2023 CLINICAL DATA:  Initial evaluation for acute chest pain. EXAM: CHEST - 2 VIEW COMPARISON:  Comparison made with prior PET-CT from 02/13/2023 and radiograph from 11/03/2022 FINDINGS: Left-sided transvenous pacemaker/AICD in place. Right-sided Port-A-Cath in place. Transverse heart size stable, and remains within normal limits. Aortic atherosclerosis noted. Emphysema noted. Asymmetric prominence of the right  hilum with retractile fibrosis and/or scarring within the adjacent right perihilar region, consistent with post treatment changes, also seen on prior PET-CT. Linear subsegmental atelectasis noted at the left lung base. No focal infiltrates. No pulmonary edema or significant pleural effusion. No pneumothorax. Visualized osseous structures and soft tissues demonstrate no acute finding. IMPRESSION: 1. No radiographic evidence for active cardiopulmonary disease. 2. Post treatment changes within the right hilum/perihilar region. 3. Aortic Atherosclerosis (ICD10-I70.0) and Emphysema (ICD10-J43.9). Electronically Signed   By: Rise Mu M.D.   On: 03/10/2023 21:22

## 2023-03-18 NOTE — Assessment & Plan Note (Signed)
He has recent ER evaluation for atypical chest pain Cardiac enzymes are borderline elevated Chest x-ray showed no evidence of abnormalities except for persistent changes related to his radiation His pain is not reproducible on palpation of his chest wall I recommend the patient to reach out to his cardiologist It appears that he was prescribed Flexeril and that seems to alleviate some of his discomfort, indicating that could be a musculoskeletal component I refilled his prescription today

## 2023-03-18 NOTE — Patient Instructions (Signed)

## 2023-03-19 ENCOUNTER — Other Ambulatory Visit: Payer: Self-pay | Admitting: Hematology and Oncology

## 2023-03-19 ENCOUNTER — Telehealth: Payer: Self-pay | Admitting: Internal Medicine

## 2023-03-19 DIAGNOSIS — C3491 Malignant neoplasm of unspecified part of right bronchus or lung: Secondary | ICD-10-CM

## 2023-03-19 LAB — T4: T4, Total: 13.5 ug/dL — ABNORMAL HIGH (ref 4.5–12.0)

## 2023-03-19 NOTE — Telephone Encounter (Signed)
Spoke with pt wife, Aware of dr Ludwig Clarks recommendations.

## 2023-03-19 NOTE — Telephone Encounter (Signed)
   Pt c/o of Chest Pain: STAT if active CP, including tightness, pressure, jaw pain, radiating pain to shoulder/upper arm/back, CP unrelieved by Nitro. Symptoms reported of SOB, nausea, vomiting, sweating.  1. Are you having CP right now? No    2. Are you experiencing any other symptoms (ex. SOB, nausea, vomiting, sweating)? No   3. Is your CP continuous or coming and going? Come and go when laying down and turns   4. Have you taken Nitroglycerin? No   5. How long have you been experiencing CP? 2 weeks    6. If NO CP at time of call then end call with telling Pt to call back or call 911 if Chest pain returns prior to return call from triage team.

## 2023-03-19 NOTE — Telephone Encounter (Signed)
Patient identification verified by 2 forms. Marilynn Rail, RN    Called and spoke to patients wife Althene Althene states:   -patient has chest pain   -chest pain feels like soreness   -pain on going for 2 weeks   -chest pain occurs with movements when going left to right   -he can't lay down on either side comfortable   -sometimes he has the pain when laying down flat with no movement   -presented to ED 2/3  -ED stated he likely had a pulled muscle  -ED provided muscle relaxer   -muscle relaxer is helping a little bit   -saw oncologist yesterday, recommends cardiology visit   -initially chest pain was on right side with movement but now it is all over  -recalls 2.5 weeks ago he was lifting a heavy wood behind his barn, shortly after he developed this pain across his chest   -has follow up appointment with PCP on 2/18 Althene denies:   -new worsening SOB   -chest pain at rest   -chest pressure  Advised patient to keep appointment with PCP  Informed patient message sent to Dr. [name] for recommendations  Patient verbalized understanding, no questions at this time

## 2023-03-20 ENCOUNTER — Telehealth: Payer: Self-pay | Admitting: Hematology and Oncology

## 2023-03-20 NOTE — Telephone Encounter (Signed)
Changed and scheduled appointments. Patient is aware of the changes made.

## 2023-04-03 ENCOUNTER — Telehealth: Payer: Self-pay

## 2023-04-03 ENCOUNTER — Other Ambulatory Visit: Payer: Self-pay | Admitting: Hematology and Oncology

## 2023-04-03 MED ORDER — OXYCODONE HCL 5 MG PO TABS: 5.0000 mg | ORAL_TABLET | Freq: Four times a day (QID) | ORAL | 0 refills | Status: DC | PRN

## 2023-04-03 NOTE — Progress Notes (Signed)
 err

## 2023-04-03 NOTE — Telephone Encounter (Signed)
 Does he want to try oxycodone pain medication? I can start him on small doses He need to stop flexeril and Robaxin Warn about constipation Let me know if he is interested and which pharmacy

## 2023-04-03 NOTE — Telephone Encounter (Signed)
 Returned call to wife and Cainan. His back pain is worse. He went to the ED on 2/3 for back pain/ pulled muscle. He is applying tylenol pain cream to back and taking tylenol OTC prn. He is taking flexeril that was given earlier. He applying a heating pad prn. Denies injury or fall.  PCP is going to refer him to Dr. Jordan Likes, neurosurgeon. Per wife, PCP does not want to give him pain medication due to his heart.  Clinten and wife wanted Dr. Bertis Ruddy to be aware of what is going on now.

## 2023-04-03 NOTE — Telephone Encounter (Signed)
 Called back and given message to wife/ Molly Maduro. They verbalized understanding. They will stop flexeril and Robaxin. He ask that oxycodone Rx be sent to [facility] in [place]. They both appreciated the call and said thank you so much.

## 2023-04-03 NOTE — Telephone Encounter (Signed)
 Rx sent Can you call his wife back on Monday to check on him? Remind her to call in the future a few days ahead of time when medicine is due for refill

## 2023-04-07 ENCOUNTER — Telehealth: Payer: Self-pay

## 2023-04-07 NOTE — Telephone Encounter (Signed)
 Called to follow up and see how is doing since starting Oxycodone. Pain is much better and he is only taking Oxycodone in the am and pm. Reviewed narcotic refill policy. He verbalized understanding.

## 2023-04-09 ENCOUNTER — Telehealth: Payer: Self-pay

## 2023-04-09 NOTE — Progress Notes (Deleted)
  Electrophysiology Office Note:   ID:  Benjamin, Cook [birth year], MRN [id number]  Primary Cardiologist: Olga Millers, MD Electrophysiologist: Maurice Small, MD (from [facility]) {Click to update primary MD,subspecialty MD or APP then REFRESH:1}    History of Present Illness:   Benjamin Cook is a 78 y.o. male with h/o significant VT, HFrEF, CAD, EVAR for large AAA in 2019 seen today for routine electrophysiology followup.   Amiodarone previously stopped in March 2024 for positive ESR. He was admitted in May for multiple VT episodes terminated by [facility].  He was also noted to have a large lung mass concerning for neoplasm. He was reloaded with amiodarone with resolution of his sustained VT and significant improvement in burden of ventricular ectopy and NSVT.   Since last being seen in our clinic the patient reports doing ***.  he denies chest pain, palpitations, dyspnea, PND, orthopnea, nausea, vomiting, dizziness, syncope, edema, weight gain, or early satiety.   Review of systems complete and found to be negative unless listed in HPI.   EP Information / Studies Reviewed:    {EKGtoday:28818}       ICD Interrogation-  reviewed in detail today,  See PACEART report.  Arrhythmia/Device History Abbott Dual Chamber ICD implanted 2016 for CHF  S/p VT ablation 01/2021 - open apical LV focus   Physical Exam:   VS:  There were no vitals taken for this visit.   Wt Readings from Last 3 Encounters:  03/18/23 144 lb 9.6 oz (65.6 kg)  02/18/23 145 lb 9.6 oz (66 kg)  01/21/23 144 lb 12.8 oz (65.7 kg)     GEN: No acute distress *** NECK: No JVD; No carotid bruits CARDIAC: {EPRHYTHM:28826}, no murmurs, rubs, gallops RESPIRATORY:  Clear to auscultation without rales, wheezing or rhonchi  ABDOMEN: Soft, non-tender, non-distended EXTREMITIES:  {EDEMA LEVEL:28147::"No"} edema; No deformity   ASSESSMENT AND PLAN:    Ventricular arrhythmia  s/p Abbott dual chamber ICD  euvolemic  today Stable on an appropriate medical regimen Normal ICD function See Pace Art report No changes today Has been on amiodarone 200 mg BID since 06/2022. ***  Paroxysmal AF EKG today shows *** Continue Xarelto 20 mg BID for CHA2DS2/VASc of at least 5  HFrEF Continue GDMT Follows with Dr. [name]  Echo 11/01/23 LVEF 20-25%     Disposition:   Follow up with {EPPROVIDERS:28135} {EPFOLLOW UP:28173}   Signed, Graciella Freer, PA-C

## 2023-04-09 NOTE — Telephone Encounter (Signed)
 Received VM from pt's daughter, Marcelino Duster stating she would like a call back as she has questions. Attempted to call her back. LVM for call back .

## 2023-04-10 ENCOUNTER — Encounter: Payer: Self-pay | Admitting: Hematology and Oncology

## 2023-04-10 ENCOUNTER — Encounter (HOSPITAL_BASED_OUTPATIENT_CLINIC_OR_DEPARTMENT_OTHER): Payer: Self-pay

## 2023-04-11 ENCOUNTER — Ambulatory Visit: Payer: Medicare HMO | Attending: Student | Admitting: Student

## 2023-04-11 DIAGNOSIS — I5042 Chronic combined systolic (congestive) and diastolic (congestive) heart failure: Secondary | ICD-10-CM

## 2023-04-11 DIAGNOSIS — Z95810 Presence of automatic (implantable) cardiac defibrillator: Secondary | ICD-10-CM

## 2023-04-11 DIAGNOSIS — I255 Ischemic cardiomyopathy: Secondary | ICD-10-CM

## 2023-04-11 DIAGNOSIS — I472 Ventricular tachycardia, unspecified: Secondary | ICD-10-CM

## 2023-04-11 DIAGNOSIS — I2510 Atherosclerotic heart disease of native coronary artery without angina pectoris: Secondary | ICD-10-CM

## 2023-04-15 ENCOUNTER — Encounter: Payer: Self-pay | Admitting: Hematology and Oncology

## 2023-04-15 ENCOUNTER — Inpatient Hospital Stay: Payer: Medicare HMO | Attending: Hematology and Oncology | Admitting: Hematology and Oncology

## 2023-04-15 ENCOUNTER — Inpatient Hospital Stay: Payer: Medicare HMO

## 2023-04-15 ENCOUNTER — Ambulatory Visit: Payer: Medicare HMO

## 2023-04-15 VITALS — BP 127/67 | HR 77 | Temp 97.4°F | Resp 18 | Ht 70.0 in | Wt 140.8 lb

## 2023-04-15 DIAGNOSIS — M7918 Myalgia, other site: Secondary | ICD-10-CM | POA: Insufficient documentation

## 2023-04-15 DIAGNOSIS — Z79899 Other long term (current) drug therapy: Secondary | ICD-10-CM | POA: Diagnosis not present

## 2023-04-15 DIAGNOSIS — C3491 Malignant neoplasm of unspecified part of right bronchus or lung: Secondary | ICD-10-CM | POA: Insufficient documentation

## 2023-04-15 DIAGNOSIS — R0789 Other chest pain: Secondary | ICD-10-CM | POA: Diagnosis not present

## 2023-04-15 DIAGNOSIS — Z5112 Encounter for antineoplastic immunotherapy: Secondary | ICD-10-CM | POA: Diagnosis present

## 2023-04-15 LAB — CBC WITH DIFFERENTIAL (CANCER CENTER ONLY)
Abs Immature Granulocytes: 0.02 10*3/uL (ref 0.00–0.07)
Basophils Absolute: 0 10*3/uL (ref 0.0–0.1)
Basophils Relative: 1 %
Eosinophils Absolute: 0.1 10*3/uL (ref 0.0–0.5)
Eosinophils Relative: 1 %
HCT: 41.1 % (ref 39.0–52.0)
Hemoglobin: 13.9 g/dL (ref 13.0–17.0)
Immature Granulocytes: 0 %
Lymphocytes Relative: 13 %
Lymphs Abs: 0.8 10*3/uL (ref 0.7–4.0)
MCH: 30.5 pg (ref 26.0–34.0)
MCHC: 33.8 g/dL (ref 30.0–36.0)
MCV: 90.1 fL (ref 80.0–100.0)
Monocytes Absolute: 0.9 10*3/uL (ref 0.1–1.0)
Monocytes Relative: 14 %
Neutro Abs: 4.6 10*3/uL (ref 1.7–7.7)
Neutrophils Relative %: 71 %
Platelet Count: 185 10*3/uL (ref 150–400)
RBC: 4.56 MIL/uL (ref 4.22–5.81)
RDW: 15.3 % (ref 11.5–15.5)
WBC Count: 6.4 10*3/uL (ref 4.0–10.5)
nRBC: 0 % (ref 0.0–0.2)

## 2023-04-15 LAB — CMP (CANCER CENTER ONLY)
ALT: 44 U/L (ref 0–44)
AST: 51 U/L — ABNORMAL HIGH (ref 15–41)
Albumin: 3.7 g/dL (ref 3.5–5.0)
Alkaline Phosphatase: 81 U/L (ref 38–126)
Anion gap: 6 (ref 5–15)
BUN: 10 mg/dL (ref 8–23)
CO2: 30 mmol/L (ref 22–32)
Calcium: 8.7 mg/dL — ABNORMAL LOW (ref 8.9–10.3)
Chloride: 102 mmol/L (ref 98–111)
Creatinine: 0.99 mg/dL (ref 0.61–1.24)
GFR, Estimated: >60 mL/min (ref >60)
Glucose, Bld: 112 mg/dL — ABNORMAL HIGH (ref 70–99)
Potassium: 3.5 mmol/L (ref 3.5–5.1)
Sodium: 138 mmol/L (ref 135–145)
Total Bilirubin: 0.8 mg/dL (ref 0.0–1.2)
Total Protein: 6.8 g/dL (ref 6.5–8.1)

## 2023-04-15 LAB — TSH: TSH: 1.339 u[IU]/mL (ref 0.350–4.500)

## 2023-04-15 MED ORDER — SODIUM CHLORIDE 0.9 % IV SOLN
1500.0000 mg | Freq: Once | INTRAVENOUS | Status: AC
  Administered 2023-04-15: 1500 mg via INTRAVENOUS
  Filled 2023-04-15: qty 30

## 2023-04-15 MED ORDER — SODIUM CHLORIDE 0.9% FLUSH
10.0000 mL | Freq: Once | INTRAVENOUS | Status: AC
  Administered 2023-04-15: 10 mL

## 2023-04-15 MED ORDER — HEPARIN SOD (PORK) LOCK FLUSH 100 UNIT/ML IV SOLN
500.0000 [IU] | Freq: Once | INTRAVENOUS | Status: AC | PRN
Start: 2023-04-15 — End: 2023-04-15
  Administered 2023-04-15: 500 [IU]

## 2023-04-15 MED ORDER — SODIUM CHLORIDE 0.9% FLUSH
10.0000 mL | INTRAVENOUS | Status: DC | PRN
  Administered 2023-04-15: 10 mL

## 2023-04-15 MED ORDER — SODIUM CHLORIDE 0.9 % IV SOLN
Freq: Once | INTRAVENOUS | Status: AC
Start: 2023-04-15 — End: 2023-04-15

## 2023-04-15 MED ORDER — OXYCODONE HCL 5 MG PO TABS: 5.0000 mg | ORAL_TABLET | Freq: Four times a day (QID) | ORAL | 0 refills | Status: DC | PRN

## 2023-04-15 NOTE — Assessment & Plan Note (Addendum)
 He was diagnosed with stage III non-small cell lung cancer in May 2024 Pathology: Non-small cell lung cancer, limited material for subtype, Foundation One test showed no actionable mutations. 6TMB, MSI low  It was unresectable at the time of diagnosis He received concurrent chemotherapy with combination of carboplatin and paclitaxel along with radiation treatment He began treatment with maintenance durvalumab since August 2024 He tolerated durvalumab well, we will continue maintenance treatment every 4 weeks I plan to repeat imaging study in July

## 2023-04-15 NOTE — Assessment & Plan Note (Addendum)
 He had ER evaluation for atypical chest pain Cardiac enzymes are borderline elevated Chest x-ray showed no evidence of abnormalities except for persistent changes related to his radiation His pain is not reproducible on palpation of his chest wall His examination is benign and most consistent with possible musculoskeletal pain It is alleviated by low-dose oxycodone I refilled his prescription today

## 2023-04-15 NOTE — Progress Notes (Signed)
 [facility] OFFICE PROGRESS NOTE  Patient Care Team: [name], [name], MD as PCP - Cardiology (Cardiology) Mealor, Roberts Gaudy, [name]  Assessment & Plan Non-small cell cancer of right lung [facility]) He was diagnosed with stage III non-small cell lung cancer in May 2024 Pathology: Non-small cell lung cancer, limited material for subtype, Foundation One test showed no actionable mutations. 6TMB, MSI low  It was unresectable at the time of diagnosis He received concurrent chemotherapy with combination of carboplatin and paclitaxel along with radiation treatment He began treatment with maintenance durvalumab since August 2024 He tolerated durvalumab well, we will continue maintenance treatment every 4 weeks I plan to repeat imaging study in July Atypical chest pain He had ER evaluation for atypical chest pain Cardiac enzymes are borderline elevated Chest x-ray showed no evidence of abnormalities except for persistent changes related to his radiation His pain is not reproducible on palpation of his chest wall His examination is benign and most consistent with possible musculoskeletal pain It is alleviated by low-dose oxycodone I refilled his prescription today  No orders of the defined types were placed in this encounter.    Artis Delay, MD  INTERVAL HISTORY: he returns for treatment follow-up Complications related to previous cycle of chemotherapy included bone aches, He denies side effects from treatment He continues to have musculoskeletal pain especially in his upper chest and back Denies recent cough, or shortness of breath  PHYSICAL EXAMINATION: ECOG PERFORMANCE STATUS: 1 - Symptomatic but completely ambulatory  Vitals:   04/15/23 1148  BP: 127/67  Pulse: 77  Resp: 18  Temp: (!) 97.4 F (36.3 C)  SpO2: 100%   Filed Weights   04/15/23 1148  Weight: 140 lb 12.8 oz  (63.9 kg)    Relevant data reviewed during this visit included CBC and CMP

## 2023-04-15 NOTE — Progress Notes (Signed)
 HPI: FU coronary disease, ischemic cardiomyopathy, PAF, prior ICD, hypertension and hyperlipidemia. Cardiac history dates back to June of 2001. At that time the patient was found to have an abnormal electrocardiogram. Catheterization revealed a 95% mid LAD and a 70% diffuse right coronary artery. His ejection fraction was 25-30%. He had PCI of his LAD at that time. Patient also with paroxysmal atrial fibrillation noted on previous ICD interrogation.  Presented with ventricular tachycardia September 2022.  Catheterization September 2022 showed 70% mid circumflex, 75% left main, 70% right coronary artery. Had VT ablation September 2022.   Also with history of abdominal aortic aneurysm repair. Again presented with recurrent ventricular tachycardia April 2024.  Amiodarone was resumed (amiodarone previously had been discontinued due to elevated ESR).  Repeat echocardiogram September 2024 showed ejection fraction 20 to 25%, severe left ventricular enlargement.  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  Since last seen, he has dyspnea after walking longer distances but not in the house.  He denies orthopnea, PND, pedal edema, chest pain or syncope.  No ICD discharges.  Current Outpatient Medications  Medication Sig Dispense Refill   acetaminophen (TYLENOL) 500 MG tablet Take 500-1,000 mg by mouth every 8 (eight) hours as needed (for pain).      amiodarone (PACERONE) 400 MG tablet Take 0.5 tablets (200 mg total) by mouth 2 (two) times daily. 90 tablet 2   atorvastatin (LIPITOR) 80 MG tablet TAKE 1 TABLET BY MOUTH  DAILY AT 6 PM 90 tablet 3   Chlorpheniramine Maleate (ALLERGY RELIEF PO) Take 1 tablet by mouth daily as needed (allergies).     empagliflozin (JARDIANCE) 10 MG TABS tablet Take 1 tablet (10 mg total)  by mouth daily before breakfast. 90 tablet 2   furosemide (LASIX) 40 MG tablet Take 0.5 tablets (20 mg total) by mouth daily. 45 tablet 2   gabapentin (NEURONTIN) 100 MG capsule Take 1 capsule (100 mg total) by mouth at bedtime. 30 capsule 0   lidocaine-prilocaine (EMLA) cream Apply 1 Application topically as needed. 30 g 3   losartan (COZAAR) 25 MG tablet Take 0.5 tablets (12.5 mg total) by mouth daily. 45 tablet 3   melatonin 5 MG TABS Take 1 tablet (5 mg total) by mouth at bedtime. 30 tablet 0   metoprolol succinate (TOPROL-XL) 25 MG 24 hr tablet Take 0.5 tablets (12.5 mg total) by mouth 2 (two) times daily. 90 tablet 2   mometasone-formoterol (DULERA) 100-5 MCG/ACT AERO Inhale 2 puffs into the lungs in the morning and at bedtime. 8.8 each 1   Multiple Vitamin (MULTIVITAMIN) capsule Take 1 capsule by mouth daily.     ondansetron (ZOFRAN) 8 MG tablet Take 1 tablet (8 mg total) by mouth every 8 (eight) hours as needed for nausea. 30 tablet 3   oxyCODONE (OXY IR/ROXICODONE) 5 MG immediate release tablet Take 1 tablet (5 mg total) by mouth every 6 (six) hours as needed for severe pain (pain score 7-10). 60 tablet 0   pantoprazole (PROTONIX) 40 MG tablet Take 40 mg by mouth daily.     Rivaroxaban (XARELTO) 15 MG TABS tablet Take 1 tablet (15 mg total) by mouth daily with supper. 30 tablet 5   senna-docusate (SENOKOT-S) 8.6-50 MG tablet Take 2 tablets by mouth daily. 60 tablet 1   spironolactone (ALDACTONE) 25 MG tablet  Take 0.5 tablets (12.5 mg total) by mouth daily. 45 tablet 2   No current facility-administered medications for this visit.     Past Medical History:  Diagnosis Date   AAA (abdominal aortic aneurysm) ([name])    Adrenal mass ([name])    per pt this is remote (10 years) and benign by biopsy   AICD (automatic cardioverter/defibrillator) present    Anxiety    Arthritis    CAD (coronary artery disease)    a. s/p PCI to LAD 2001 b. myoview 2014 high risk with scar LAD/RCA territory but  no ischemia   Cancer ([name])    Cardiomyopathy, ischemic    Chronic HFrEF (heart failure with reduced ejection fraction) ([name])    COPD (chronic obstructive pulmonary disease) ([name])    smokes cigars but has quit cigarettes   Defibrillator discharge 04/21/2019   Diverticulitis    Dyspnea    Flu 03/2015   HTN (hypertension)    Hyperlipidemia    Paroxysmal atrial fibrillation ([name]) 03/2015   chads2vasc score is 4   PSA (psoriatic arthritis) ([name])    increased   PVD (peripheral vascular disease) ([name])    VT (ventricular tachycardia) ([name])     Past Surgical History:  Procedure Laterality Date   ABDOMINAL AORTIC ENDOVASCULAR STENT GRAFT N/A 03/03/2017   Procedure: ABDOMINAL AORTIC ENDOVASCULAR STENT GRAFT;  Surgeon: Maeola Harman, MD;  Location: [facility] OR;  Service: Vascular;  Laterality: N/A;   BRONCHIAL NEEDLE ASPIRATION BIOPSY  06/20/2022   Procedure: BRONCHIAL NEEDLE ASPIRATION BIOPSIES;  Surgeon: Omar Person, MD;  Location: [facility] ENDOSCOPY;  Service: Pulmonary;;   cataract surgery     CORONARY ANGIOGRAPHY N/A 10/12/2020   Procedure: CORONARY ANGIOGRAPHY;  Surgeon: Runell Gess, MD;  Location: [facility] INVASIVE CV LAB;  Service: Cardiovascular;  Laterality: N/A;   CORONARY ANGIOPLASTY WITH STENT PLACEMENT  2001   a. PCI to LAD   IMPLANTABLE CARDIOVERTER DEFIBRILLATOR (ICD) GENERATOR CHANGE N/A 03/18/2014   a. SJM Fortify ST DR ICD implanted by [facility] for primary prevention b. gen change 03/2014    IR IMAGING GUIDED PORT INSERTION  07/09/2022   LEAD REVISION/REPAIR N/A 07/17/2016   Procedure: Lead Revision/Repair;  Surgeon: Marinus Maw, MD;  Location: [facility] INVASIVE CV LAB;  Service: Cardiovascular;  Laterality: N/A;   LEFT HEART CATHETERIZATION WITH CORONARY ANGIOGRAM N/A 05/27/2014   Procedure: LEFT HEART CATHETERIZATION WITH CORONARY ANGIOGRAM;  Surgeon: Tonny Bollman, MD;  Location: [facility] CATH LAB;  Service: Cardiovascular;  Laterality: N/A;   REPAIR KNEE LIGAMENT     V TACH ABLATION  N/A 10/26/2020   Procedure: V TACH ABLATION;  Surgeon: Marinus Maw, MD;  Location: [facility] INVASIVE CV LAB;  Service: Cardiovascular;  Laterality: N/A;   VIDEO BRONCHOSCOPY WITH ENDOBRONCHIAL ULTRASOUND N/A 06/20/2022   Procedure: VIDEO BRONCHOSCOPY WITH ENDOBRONCHIAL ULTRASOUND;  Surgeon: Omar Person, MD;  Location[facility] ENDOSCOPY;  Service: Pulmonary;  Laterality: N/A;    Social History   Socioeconomic History   Marital status: Married    Spouse name: Althene   Number of children: 3   Years of education: Not on file   Highest education level: 10th grade  Occupational History   Occupation: Therapist, music care   Occupation: Retired  Tobacco Use   Smoking status: Some Days    Types: Cigars    Last attempt to quit: 2020    Years since quitting: 5.2   Smokeless tobacco: Never   Tobacco comments:    smokes cigars now, no longer smokes cigarettes  but previously smoked 2ppd  Vaping Use   Vaping status: Never Used  Substance and Sexual Activity   Alcohol use: No   Drug use: No   Sexual activity: Not Currently    Partners: Female    Birth control/protection: None  Other Topics Concern   Not on file  Social History Narrative   Not on file   Social Drivers of Health   Financial Resource Strain: Low Risk  (10/30/2022)   Overall Financial Resource Strain (CARDIA)    Difficulty of Paying Living Expenses: Not very hard  Food Insecurity: No Food Insecurity (10/29/2022)   Hunger Vital Sign    Worried About Running Out of Food in the Last Year: Never true    Ran Out of Food in the Last Year: Never true  Transportation Needs: No Transportation Needs (10/30/2022)   PRAPARE - Administrator, Civil Service (Medical): No    Lack of Transportation (Non-Medical): No  Physical Activity: Not on file  Stress: Not on file  Social Connections: Unknown (06/19/2021)   [facility]   Social Network    Social Network: Not on file  Intimate Partner Violence:  Patient Unable To Answer (10/29/2022)   Humiliation, Afraid, Rape, and Kick questionnaire    Fear of Current or Ex-Partner: Patient unable to answer    Emotionally Abused: Patient unable to answer    Physically Abused: Patient unable to answer    Sexually Abused: Patient unable to answer    Family History  Problem Relation Age of Onset   Cirrhosis Mother        due to ETOH   Cancer Neg Hx     ROS: no fevers or chills, productive cough, hemoptysis, dysphasia, odynophagia, melena, hematochezia, dysuria, hematuria, rash, seizure activity, orthopnea, PND, pedal edema, claudication. Remaining systems are negative.  Physical Exam: Well-developed well-nourished in no acute distress.  Skin is warm and dry.  HEENT is normal.  Neck is supple.  Chest is clear to auscultation with normal expansion.  Cardiovascular exam is regular rate and rhythm.  Abdominal exam nontender or distended. No masses palpated. Extremities show no edema. neuro grossly intact  A/P  1 ventricular tachycardia-I will decrease amiodarone to 200 mg daily.  2 ischemic cardiomyopathy-continue losartan, Toprol, Jardiance and spironolactone.  Patient's blood pressure would not allow Entresto.  3 coronary artery disease-patient doing well with no chest pain.  Continue statin.  4 paroxysmal atrial fibrillation-continue Toprol at present dose.  Continue Xarelto.  5 history of abdominal aortic aneurysm repair-stable repair on recent CTA.  Followed by vascular surgery.  6 ICD-managed by electrophysiology.  We will arrange follow-up appointment.  7 hypertension-patient's blood pressure is controlled.  Continue present medical regimen.  8 hyperlipidemia-continue statin.  9 lung cancer-Per oncology.  Olga Millers, MD

## 2023-04-15 NOTE — Patient Instructions (Addendum)

## 2023-04-16 LAB — T4: T4, Total: 17.2 ug/dL — ABNORMAL HIGH (ref 4.5–12.0)

## 2023-04-23 ENCOUNTER — Ambulatory Visit (INDEPENDENT_AMBULATORY_CARE_PROVIDER_SITE_OTHER): Payer: Medicare (Managed Care)

## 2023-04-23 DIAGNOSIS — I255 Ischemic cardiomyopathy: Secondary | ICD-10-CM | POA: Diagnosis not present

## 2023-04-25 LAB — CUP PACEART REMOTE DEVICE CHECK
Battery Remaining Longevity: 6 mo
Battery Remaining Percentage: 6 %
Battery Voltage: 2.63 V
Brady Statistic AP VP Percent: 4.1 %
Brady Statistic AP VS Percent: 5.2 %
Brady Statistic AS VP Percent: 1 %
Brady Statistic AS VS Percent: 90 %
Brady Statistic RA Percent Paced: 9.1 %
Brady Statistic RV Percent Paced: 5 %
Date Time Interrogation Session: 20250321102450
HighPow Impedance: 65 Ohm
HighPow Impedance: 65 Ohm
Implantable Lead Connection Status: 753985
Implantable Lead Connection Status: 753985
Implantable Lead Implant Date: 20120420
Implantable Lead Implant Date: 20180613
Implantable Lead Location: 753859
Implantable Lead Location: 753860
Implantable Pulse Generator Implant Date: 20160212
Lead Channel Impedance Value: 350 Ohm
Lead Channel Impedance Value: 530 Ohm
Lead Channel Pacing Threshold Amplitude: 0.5 V
Lead Channel Pacing Threshold Amplitude: 0.75 V
Lead Channel Pacing Threshold Pulse Width: 0.5 ms
Lead Channel Pacing Threshold Pulse Width: 0.5 ms
Lead Channel Sensing Intrinsic Amplitude: 1.8 mV
Lead Channel Sensing Intrinsic Amplitude: 11.6 mV
Lead Channel Setting Pacing Amplitude: 2 V
Lead Channel Setting Pacing Amplitude: 2.5 V
Lead Channel Setting Pacing Pulse Width: 0.5 ms
Lead Channel Setting Sensing Sensitivity: 0.5 mV
Pulse Gen Serial Number: 7225079

## 2023-04-28 ENCOUNTER — Encounter: Payer: Self-pay | Admitting: Internal Medicine

## 2023-04-29 ENCOUNTER — Encounter: Payer: Self-pay | Admitting: Cardiology

## 2023-04-29 ENCOUNTER — Ambulatory Visit: Payer: Medicare HMO | Attending: Cardiology | Admitting: Cardiology

## 2023-04-29 VITALS — BP 115/64 | HR 59 | Ht 70.0 in | Wt 141.0 lb

## 2023-04-29 DIAGNOSIS — I255 Ischemic cardiomyopathy: Secondary | ICD-10-CM

## 2023-04-29 DIAGNOSIS — Z95810 Presence of automatic (implantable) cardiac defibrillator: Secondary | ICD-10-CM

## 2023-04-29 DIAGNOSIS — I2510 Atherosclerotic heart disease of native coronary artery without angina pectoris: Secondary | ICD-10-CM | POA: Diagnosis not present

## 2023-04-29 DIAGNOSIS — I480 Paroxysmal atrial fibrillation: Secondary | ICD-10-CM

## 2023-04-29 DIAGNOSIS — E785 Hyperlipidemia, unspecified: Secondary | ICD-10-CM

## 2023-04-29 DIAGNOSIS — I10 Essential (primary) hypertension: Secondary | ICD-10-CM | POA: Diagnosis not present

## 2023-04-29 DIAGNOSIS — I5042 Chronic combined systolic (congestive) and diastolic (congestive) heart failure: Secondary | ICD-10-CM

## 2023-04-29 MED ORDER — AMIODARONE HCL 200 MG PO TABS: 200.0000 mg | ORAL_TABLET | Freq: Every day | ORAL | Status: DC

## 2023-04-29 NOTE — Patient Instructions (Signed)
 Medication Instructions:   DECREASE AMIODARONE TO 200 MG ONCE DAILY=1/2 OF THE 400 MG TABLET ONCE DAILY  *If you need a refill on your cardiac medications before your next appointment, please call your pharmacy*   Follow-Up: At [facility], you and your health needs are our priority.  As part of our continuing mission to provide you with exceptional heart care, we have created designated Provider Care Teams.  These Care Teams include your primary Cardiologist (physician) and Advanced Practice Providers (APPs -  Physician Assistants and Nurse Practitioners) who all work together to provide you with the care you need, when you need it.  We recommend signing up for the patient portal called "MyChart".  Sign up information is provided on this After Visit Summary.  MyChart is used to connect with patients for Virtual Visits (Telemedicine).  Patients are able to view lab/test results, encounter notes, upcoming appointments, etc.  Non-urgent messages can be sent to your provider as well.   To learn more about what you can do with MyChart, go to [email or web address].    Your next appointment:   6 month(s)  Provider:   Olga Millers, MD     Other Instructions  NEEDS FOLLOW UP WITH EP PER RECALL

## 2023-05-13 ENCOUNTER — Inpatient Hospital Stay: Payer: Medicare HMO

## 2023-05-13 ENCOUNTER — Inpatient Hospital Stay: Payer: Medicare HMO | Attending: Hematology and Oncology | Admitting: Hematology and Oncology

## 2023-05-13 ENCOUNTER — Encounter: Payer: Self-pay | Admitting: Hematology and Oncology

## 2023-05-13 VITALS — BP 114/70 | HR 55 | Temp 97.8°F | Resp 18 | Ht 70.0 in | Wt 135.8 lb

## 2023-05-13 DIAGNOSIS — R748 Abnormal levels of other serum enzymes: Secondary | ICD-10-CM | POA: Diagnosis not present

## 2023-05-13 DIAGNOSIS — K5900 Constipation, unspecified: Secondary | ICD-10-CM | POA: Insufficient documentation

## 2023-05-13 DIAGNOSIS — G8929 Other chronic pain: Secondary | ICD-10-CM | POA: Diagnosis not present

## 2023-05-13 DIAGNOSIS — Z79899 Other long term (current) drug therapy: Secondary | ICD-10-CM | POA: Diagnosis not present

## 2023-05-13 DIAGNOSIS — C3491 Malignant neoplasm of unspecified part of right bronchus or lung: Secondary | ICD-10-CM | POA: Diagnosis present

## 2023-05-13 DIAGNOSIS — Z5112 Encounter for antineoplastic immunotherapy: Secondary | ICD-10-CM | POA: Insufficient documentation

## 2023-05-13 DIAGNOSIS — K5909 Other constipation: Secondary | ICD-10-CM

## 2023-05-13 DIAGNOSIS — M549 Dorsalgia, unspecified: Secondary | ICD-10-CM | POA: Diagnosis not present

## 2023-05-13 LAB — CBC WITH DIFFERENTIAL (CANCER CENTER ONLY)
Abs Immature Granulocytes: 0.03 10*3/uL (ref 0.00–0.07)
Basophils Absolute: 0 10*3/uL (ref 0.0–0.1)
Basophils Relative: 0 %
Eosinophils Absolute: 0.2 10*3/uL (ref 0.0–0.5)
Eosinophils Relative: 2 %
HCT: 40.9 % (ref 39.0–52.0)
Hemoglobin: 14.1 g/dL (ref 13.0–17.0)
Immature Granulocytes: 0 %
Lymphocytes Relative: 16 %
Lymphs Abs: 1.2 10*3/uL (ref 0.7–4.0)
MCH: 30.9 pg (ref 26.0–34.0)
MCHC: 34.5 g/dL (ref 30.0–36.0)
MCV: 89.5 fL (ref 80.0–100.0)
Monocytes Absolute: 0.8 10*3/uL (ref 0.1–1.0)
Monocytes Relative: 10 %
Neutro Abs: 5.5 10*3/uL (ref 1.7–7.7)
Neutrophils Relative %: 72 %
Platelet Count: 181 10*3/uL (ref 150–400)
RBC: 4.57 MIL/uL (ref 4.22–5.81)
RDW: 15.6 % — ABNORMAL HIGH (ref 11.5–15.5)
WBC Count: 7.8 10*3/uL (ref 4.0–10.5)
nRBC: 0 % (ref 0.0–0.2)

## 2023-05-13 LAB — CMP (CANCER CENTER ONLY)
ALT: 46 U/L — ABNORMAL HIGH (ref 0–44)
AST: 47 U/L — ABNORMAL HIGH (ref 15–41)
Albumin: 3.9 g/dL (ref 3.5–5.0)
Alkaline Phosphatase: 87 U/L (ref 38–126)
Anion gap: 6 (ref 5–15)
BUN: 14 mg/dL (ref 8–23)
CO2: 27 mmol/L (ref 22–32)
Calcium: 9.2 mg/dL (ref 8.9–10.3)
Chloride: 105 mmol/L (ref 98–111)
Creatinine: 1.06 mg/dL (ref 0.61–1.24)
GFR, Estimated: >60 mL/min (ref >60)
Glucose, Bld: 114 mg/dL — ABNORMAL HIGH (ref 70–99)
Potassium: 3.9 mmol/L (ref 3.5–5.1)
Sodium: 138 mmol/L (ref 135–145)
Total Bilirubin: 0.8 mg/dL (ref 0.0–1.2)
Total Protein: 7.3 g/dL (ref 6.5–8.1)

## 2023-05-13 LAB — TSH: TSH: 1.64 u[IU]/mL (ref 0.350–4.500)

## 2023-05-13 MED ORDER — SODIUM CHLORIDE 0.9 % IV SOLN: Freq: Once | INTRAVENOUS | Status: AC

## 2023-05-13 MED ORDER — SODIUM CHLORIDE 0.9% FLUSH
10.0000 mL | Freq: Once | INTRAVENOUS | Status: AC | PRN
  Administered 2023-05-13: 10 mL

## 2023-05-13 MED ORDER — SODIUM CHLORIDE 0.9% FLUSH
10.0000 mL | INTRAVENOUS | Status: DC | PRN
  Administered 2023-05-13: 10 mL

## 2023-05-13 MED ORDER — DURVALUMAB 500 MG/10ML IV SOLN
1500.0000 mg | Freq: Once | INTRAVENOUS | Status: AC
  Administered 2023-05-13: 1500 mg via INTRAVENOUS
  Filled 2023-05-13: qty 30

## 2023-05-13 MED ORDER — HEPARIN SOD (PORK) LOCK FLUSH 100 UNIT/ML IV SOLN
500.0000 [IU] | Freq: Once | INTRAVENOUS | Status: AC | PRN
  Administered 2023-05-13: 500 [IU]

## 2023-05-13 NOTE — Assessment & Plan Note (Addendum)
 Cause unknown, could be related to his medication Observe only

## 2023-05-13 NOTE — Assessment & Plan Note (Addendum)
 He was diagnosed with stage III non-small cell lung cancer in May 2024 Pathology: Non-small cell lung cancer, limited material for subtype, Foundation One test showed no actionable mutations. 6TMB, MSI low  It was unresectable at the time of diagnosis He received concurrent chemotherapy with combination of carboplatin and paclitaxel along with radiation treatment, followed by maintenance durvalumab since August 2024  He tolerated durvalumab well, we will continue maintenance treatment every 4 weeks I plan to repeat imaging study in July

## 2023-05-13 NOTE — Progress Notes (Signed)
 [facility] OFFICE PROGRESS NOTE  Patient Care Team: [name], [name], MD as PCP - Cardiology (Cardiology) Mealor, Roberts Gaudy, [name]  Assessment & Plan Non-small cell cancer of right lung [facility]) He was diagnosed with stage III non-small cell lung cancer in May 2024 Pathology: Non-small cell lung cancer, limited material for subtype, Foundation One test showed no actionable mutations. 6TMB, MSI low  It was unresectable at the time of diagnosis He received concurrent chemotherapy with combination of carboplatin and paclitaxel along with radiation treatment, followed by maintenance durvalumab since August 2024  He tolerated durvalumab well, we will continue maintenance treatment every 4 weeks I plan to repeat imaging study in July Other constipation He has chronic constipation We discussed importance of aggressive laxatives Suspect this might be contributing to his recent nausea Elevated liver enzymes Cause unknown, could be related to his medication Observe only  No orders of the defined types were placed in this encounter.    Artis Delay, MD  INTERVAL HISTORY: he returns for treatment follow-up Complications related to previous cycle of chemotherapy included nausea with/without vomiting,, constipation,, weight loss,, and elevated liver enzymes He had recent nausea He cannot recall the frequency of his bowel habits He denies side effects from treatment He has chronic back pain According to his wife, he lies on his back a lot He was seen by orthopedics surgeon for evaluation recently  PHYSICAL EXAMINATION: ECOG PERFORMANCE STATUS: 2 - Symptomatic, <50% confined to bed  Vitals:   05/13/23 1215  BP: 114/70  Pulse: (!) 55  Resp: 18  Temp: 97.8 F (36.6 C)  SpO2: 100%   Filed Weights   05/13/23 1215  Weight: 135 lb 12.8 oz (61.6 kg)    Relevant data reviewed  during this visit included CBC and CMP

## 2023-05-13 NOTE — Patient Instructions (Signed)
 [facility] - A DEPT OF [facility]  Discharge Instructions: Thank you for choosing [facility] to provide your oncology and hematology care.   If you have a lab appointment with the Cancer Center, please go directly to the Cancer Center and check in at the registration area.   Wear comfortable clothing and clothing appropriate for easy access to any Portacath or PICC line.   We strive to give you quality time with your provider. You may need to reschedule your appointment if you arrive late (15 or more minutes).  Arriving late affects you and other patients whose appointments are after yours.  Also, if you miss three or more appointments without notifying the office, you may be dismissed from the clinic at the provider's discretion.      For prescription refill requests, have your pharmacy contact our office and allow 72 hours for refills to be completed.    Today you received the following chemotherapy and/or immunotherapy agents: imfinzi   To help prevent nausea and vomiting after your treatment, we encourage you to take your nausea medication as directed.  BELOW ARE SYMPTOMS THAT SHOULD BE REPORTED IMMEDIATELY: *FEVER GREATER THAN 100.4 F (38 C) OR HIGHER *CHILLS OR SWEATING *NAUSEA AND VOMITING THAT IS NOT CONTROLLED WITH YOUR NAUSEA MEDICATION *UNUSUAL SHORTNESS OF BREATH *UNUSUAL BRUISING OR BLEEDING *URINARY PROBLEMS (pain or burning when urinating, or frequent urination) *BOWEL PROBLEMS (unusual diarrhea, constipation, pain near the anus) TENDERNESS IN MOUTH AND THROAT WITH OR WITHOUT PRESENCE OF ULCERS (sore throat, sores in mouth, or a toothache) UNUSUAL RASH, SWELLING OR PAIN  UNUSUAL VAGINAL DISCHARGE OR ITCHING   Items with * indicate a potential emergency and should be followed up as soon as possible or go to the Emergency Department if any problems should occur.  Please show the CHEMOTHERAPY ALERT CARD or IMMUNOTHERAPY ALERT  CARD at check-in to the Emergency Department and triage nurse.  Should you have questions after your visit or need to cancel or reschedule your appointment, please contact [facility] - A DEPT OF [facility]  Dept: [phone]  and follow the prompts.  Office hours are 8:00 a.m. to 4:30 p.m. Monday - Friday. Please note that voicemails left after 4:00 p.m. may not be returned until the following business day.  We are closed weekends and major holidays. You have access to a nurse at all times for urgent questions. Please call the main number to the clinic Dept: [phone] and follow the prompts.   For any non-urgent questions, you may also contact your provider using MyChart. We now offer e-Visits for anyone 65 and older to request care online for non-urgent symptoms. For details visit [email or web address].   Also download the MyChart app! Go to the app store, search "MyChart", open the app, select [facility], and log in with your MyChart username and password.

## 2023-05-13 NOTE — Assessment & Plan Note (Addendum)
 He has chronic constipation We discussed importance of aggressive laxatives Suspect this might be contributing to his recent nausea

## 2023-05-14 LAB — T4: T4, Total: 15.1 ug/dL — ABNORMAL HIGH (ref 4.5–12.0)

## 2023-05-21 ENCOUNTER — Other Ambulatory Visit: Payer: Self-pay

## 2023-05-21 MED ORDER — AMIODARONE HCL 200 MG PO TABS: 200.0000 mg | ORAL_TABLET | Freq: Every day | ORAL | 3 refills | Status: AC

## 2023-06-03 NOTE — Progress Notes (Unsigned)
 Cardiology Office Note Date:  06/03/2023  Patient ID:  Benjamin Cook, Benjamin Cook [birth year], MRN [id number] PCP:  Ulysees Gander, MD  Cardiologist:  Dr. Audery Blazing Electrophysiologist: Dr. Nunzio Belch >>> Dr. Arlester Ladd    Chief Complaint:  6 mo EP visit  History of Present Illness: Benjamin Cook is a 78 y.o. male with history of  anxiety, HTN, HLD, COPD Lung Cancer (found May 2024) stage III non-small cell lung cancer  unresectable at the time of diagnosis received concurrent chemotherapy with combination of carboplatin  and paclitaxel  along with radiation treatment, followed by maintenance durvalumab  follows with heme-on  CAD (s/p PCI to LAD 2001),  ICM, chronic CHF (systolic), ICD, VT, AFib,  AAA (s/p endovascular repair, Dr. Vikki Graves) PVD: mod right RAS   Last saw EP, Dr. Arlester Ladd 06/19/22, no recurrent sustained VT, continued amiodarone  with benefits felt to outweigh risks  Saw Dr. Audery Blazing 04/29/23, DOE with long distances, doing well Amiodarone  dose reduced  TODAY  He is accompanied by his wife All in all doing well, appertite waxes/wanes, but on an jupswing currently, has lost some weight though.  No CP, palpitations or cardiac awareness No near syncope or syncope Denies SOB No bleeding or signs of bleeding   Device information SJM dual chamber ICD,  Implanted 2012, gen change 03/18/2014 Has an abandoned RV lead (fracture), new lead placed 2018  Known A lead noise   Arrhythmia Hx AFib  VT: 04/21/2019  VT episode, MMVT, 203bpm, one ATP delivered failed, , one HV therapy, 25J with clean break ATP therapies were added 10/30/19: VT w/HV tx (pt preferred to hold off AAD), stress with only peri-infarct ischemia, not felt to need cath  VT ablation apical LV focus, Dec 2022  VT April 2024 > started on amiodarone   AAD Given amiodarone  in ER March 2021, None to date otherwise   Past Medical History:  Diagnosis Date   AAA (abdominal aortic aneurysm) ([name])    Adrenal mass  ([name])    per pt this is remote (10 years) and benign by biopsy   AICD (automatic cardioverter/defibrillator) present    Anxiety    Arthritis    CAD (coronary artery disease)    a. s/p PCI to LAD 2001 b. myoview  2014 high risk with scar LAD/RCA territory but no ischemia   Cancer ([name])    Cardiomyopathy, ischemic    Chronic HFrEF (heart failure with reduced ejection fraction) ([name])    COPD (chronic obstructive pulmonary disease) ([name])    smokes cigars but has quit cigarettes   Defibrillator discharge 04/21/2019   Diverticulitis    Dyspnea    Flu 03/2015   HTN (hypertension)    Hyperlipidemia    Paroxysmal atrial fibrillation ([name]) 03/2015   chads2vasc score is 4   PSA (psoriatic arthritis) ([name])    increased   PVD (peripheral vascular disease) ([name])    VT (ventricular tachycardia) ([name])     Past Surgical History:  Procedure Laterality Date   ABDOMINAL AORTIC ENDOVASCULAR STENT GRAFT N/A 03/03/2017   Procedure: ABDOMINAL AORTIC ENDOVASCULAR STENT GRAFT;  Surgeon: Adine Hoof, MD;  Location: [facility] OR;  Service: Vascular;  Laterality: N/A;   BRONCHIAL NEEDLE ASPIRATION BIOPSY  06/20/2022   Procedure: BRONCHIAL NEEDLE ASPIRATION BIOPSIES;  Surgeon: Gloriajean Large, MD;  Location: [facility] ENDOSCOPY;  Service: Pulmonary;;   cataract surgery     CORONARY ANGIOGRAPHY N/A 10/12/2020   Procedure: CORONARY ANGIOGRAPHY;  Surgeon: Avanell Leigh, MD;  Location: [facility] INVASIVE CV LAB;  Service:  Cardiovascular;  Laterality: N/A;   CORONARY ANGIOPLASTY WITH STENT PLACEMENT  2001   a. PCI to LAD   IMPLANTABLE CARDIOVERTER DEFIBRILLATOR (ICD) GENERATOR CHANGE N/A 03/18/2014   a. SJM Fortify ST DR ICD implanted by [facility] for primary prevention b. gen change 03/2014    IR IMAGING GUIDED PORT INSERTION  07/09/2022   LEAD REVISION/REPAIR N/A 07/17/2016   Procedure: Lead Revision/Repair;  Surgeon: Tammie Fall, MD;  Location: [facility] INVASIVE CV LAB;  Service: Cardiovascular;  Laterality: N/A;   LEFT  HEART CATHETERIZATION WITH CORONARY ANGIOGRAM N/A 05/27/2014   Procedure: LEFT HEART CATHETERIZATION WITH CORONARY ANGIOGRAM;  Surgeon: Arnoldo Lapping, MD;  Location: [facility] CATH LAB;  Service: Cardiovascular;  Laterality: N/A;   REPAIR KNEE LIGAMENT     V TACH ABLATION N/A 10/26/2020   Procedure: V TACH ABLATION;  Surgeon: Tammie Fall, MD;  Location: [facility] INVASIVE CV LAB;  Service: Cardiovascular;  Laterality: N/A;   VIDEO BRONCHOSCOPY WITH ENDOBRONCHIAL ULTRASOUND N/A 06/20/2022   Procedure: VIDEO BRONCHOSCOPY WITH ENDOBRONCHIAL ULTRASOUND;  Surgeon: Gloriajean Large, MD;  Location: [facility] ENDOSCOPY;  Service: Pulmonary;  Laterality: N/A;    Current Outpatient Medications  Medication Sig Dispense Refill   acetaminophen  (TYLENOL ) 500 MG tablet Take 500-1,000 mg by mouth every 8 (eight) hours as needed (for pain).      amiodarone  (PACERONE ) 200 MG tablet Take 1 tablet (200 mg total) by mouth daily. 90 tablet 3   atorvastatin  (LIPITOR ) 80 MG tablet TAKE 1 TABLET BY MOUTH  DAILY AT 6 PM 90 tablet 3   Chlorpheniramine Maleate (ALLERGY RELIEF PO) Take 1 tablet by mouth daily as needed (allergies).     empagliflozin  (JARDIANCE ) 10 MG TABS tablet Take 1 tablet (10 mg total) by mouth daily before breakfast. 90 tablet 2   furosemide  (LASIX ) 40 MG tablet Take 0.5 tablets (20 mg total) by mouth daily. 45 tablet 2   gabapentin  (NEURONTIN ) 100 MG capsule Take 1 capsule (100 mg total) by mouth at bedtime. 30 capsule 0   lidocaine -prilocaine  (EMLA ) cream Apply 1 Application topically as needed. 30 g 3   losartan  (COZAAR ) 25 MG tablet Take 0.5 tablets (12.5 mg total) by mouth daily. 45 tablet 3   melatonin 5 MG TABS Take 1 tablet (5 mg total) by mouth at bedtime. 30 tablet 0   metoprolol  succinate (TOPROL -XL) 25 MG 24 hr tablet Take 0.5 tablets (12.5 mg total) by mouth 2 (two) times daily. 90 tablet 2   mometasone -formoterol  (DULERA ) 100-5 MCG/ACT AERO Inhale 2 puffs into the lungs in the morning and at bedtime. 8.8  each 1   Multiple Vitamin (MULTIVITAMIN) capsule Take 1 capsule by mouth daily.     ondansetron  (ZOFRAN ) 8 MG tablet Take 1 tablet (8 mg total) by mouth every 8 (eight) hours as needed for nausea. 30 tablet 3   oxyCODONE  (OXY IR/ROXICODONE ) 5 MG immediate release tablet Take 1 tablet (5 mg total) by mouth every 6 (six) hours as needed for severe pain (pain score 7-10). 60 tablet 0   pantoprazole  (PROTONIX ) 40 MG tablet Take 40 mg by mouth daily.     Rivaroxaban  (XARELTO ) 15 MG TABS tablet Take 1 tablet (15 mg total) by mouth daily with supper. 30 tablet 5   senna-docusate (SENOKOT-S) 8.6-50 MG tablet Take 2 tablets by mouth daily. 60 tablet 1   spironolactone  (ALDACTONE ) 25 MG tablet Take 0.5 tablets (12.5 mg total) by mouth daily. 45 tablet 2   No current facility-administered medications for this visit.  Allergies:   Paclitaxel    Social History:  The patient  reports that he has been smoking cigars. He has never used smokeless tobacco. He reports that he does not drink alcohol and does not use drugs.   Family History:  The patient's family history includes Cirrhosis in his mother.  ROS:  Please see the history of present illness.    All other systems are reviewed and otherwise negative.   PHYSICAL EXAM:  VS:  There were no vitals taken for this visit. BMI: There is no height or weight on file to calculate BMI. Well nourished, well developed, in no acute distress HEENT: normocephalic, atraumatic Neck: no JVD, carotid bruits or masses Cardiac:   RRR; no significant murmurs, no rubs, or gallops Lungs:  CTA b/l, with slight end exp wheezing b/l, movinng air well,  no rhonchi or rales Abd: soft, nontender MS: no deformity, age appropriateatrophy Ext:  no edema Skin: warm and dry, no rash Neuro:  No gross deficits appreciated Psych: euthymic mood, full affect  ICD site is stable, no tethering or discomfort   EKG:  Not done today  Device interrogation done today and reviewed by  myself:  Battery ~ 56mo to ERI,  lead measurements are good AMS episodes: All available EGMs reviewed, all are false for noise, and some FF No true AF  No VT Known A lead noise, unable to reproduce today   11/23/2019: stress myoview  Nuclear stress EF: 22%. No T wave inversion was noted during stress. There was no ST segment deviation noted during stress. Defect 1: There is a large defect of severe severity present in the basal inferior, mid anteroseptal, mid inferoseptal, mid inferior, apical septal, apical inferior, apical lateral and apex location. Findings consistent with prior myocardial infarction with peri-infarct ischemia. This is a high risk study. The left ventricular ejection fraction is severely decreased (<30%).   Large size, severe severity mostly fixed (SDS 3) apical to mid inferior, apical, apical septal and anteroapical perfusion defect suggestive of mid to distal LAD and RCA territory scar with minimal peri-infarct ischemia. LVEF 22% with akinesis of the previously mentioned walls. This is a high risk study. Compared to a prior study on 12/21/2012, there is no significant change.    04/09/2019; TTE IMPRESSIONS   1. Left ventricular ejection fraction, by estimation, is 35%%. The left  ventricle has moderate to severely decreased function. There is mild left  ventricular hypertrophy. Left ventricular diastolic parameters are  consistent with Grade I diastolic  dysfunction (impaired relaxation).   2. Right ventricular systolic function is normal. The right ventricular  size is normal.   3. The mitral valve is normal in structure and function. Trivial mitral  valve regurgitation.   4. The aortic valve is abnormal. Aortic valve regurgitation is trivial.  Mild aortic valve sclerosis is present, with no evidence of aortic valve  stenosis.   5. The inferior vena cava is normal in size with greater than 50%  respiratory variability, suggesting right atrial pressure of 3  mmHg.     Recent Labs: 11/05/2022: Magnesium  1.9 11/13/2022: B Natriuretic Peptide 265.2 05/13/2023: ALT 46; BUN 14; Creatinine 1.06; Hemoglobin 14.1; Platelet Count 181; Potassium 3.9; Sodium 138; TSH 1.640  No results found for requested labs within last 365 days.   CrCl cannot be calculated (Patient's most recent lab result is older than the maximum 21 days allowed.).   Wt Readings from Last 3 Encounters:  05/13/23 135 lb 12.8 oz (61.6 kg)  04/29/23 141  lb (64 kg)  04/15/23 140 lb 12.8 oz (63.9 kg)     Other studies reviewed: Additional studies/records reviewed today include: summarized above  ASSESSMENT AND PLAN:  1. ICD     Intact function     no programming changes made  Battery is nearing ERI A lead noise with stable measurements Would not anticipate lead revision at gen change  2. VT     None on device check today     Amiodarone      Labs are UTD  3. ICM 4. Chronic CHF (systolic)      CorVue is at baseline/stable      No symptoms or exam findings to suggest volume OL      BP limits GDMT     C/w Dr. Audery Blazing  5. Paroxysmal Afib     CHA2DS2Vasc is 5-6, onXarelto, appropriately dosed     Zero %  6. CAD     No anginal symptoms     On statin, BB, no ASA with xarelto      C/w Dr. Audery Blazing  7. Secondary hypercoagulable state    Disposition: monthly remotes, back in 5-39mo, sooner if needed   Current medicines are reviewed at length with the patient today.  The patient did not have any concerns regarding medicines.  [place], PA-C 06/03/2023 12:17 PM     [facility] [address] [place] [address] [phone] (office)  [phone] (fax)

## 2023-06-04 ENCOUNTER — Encounter: Payer: Self-pay | Admitting: Physician Assistant

## 2023-06-04 ENCOUNTER — Ambulatory Visit: Attending: Physician Assistant | Admitting: Physician Assistant

## 2023-06-04 VITALS — BP 100/64 | HR 69 | Ht 70.0 in | Wt 133.6 lb

## 2023-06-04 DIAGNOSIS — I2510 Atherosclerotic heart disease of native coronary artery without angina pectoris: Secondary | ICD-10-CM

## 2023-06-04 DIAGNOSIS — Z95810 Presence of automatic (implantable) cardiac defibrillator: Secondary | ICD-10-CM | POA: Diagnosis not present

## 2023-06-04 DIAGNOSIS — I480 Paroxysmal atrial fibrillation: Secondary | ICD-10-CM

## 2023-06-04 DIAGNOSIS — I255 Ischemic cardiomyopathy: Secondary | ICD-10-CM

## 2023-06-04 DIAGNOSIS — D6869 Other thrombophilia: Secondary | ICD-10-CM

## 2023-06-04 DIAGNOSIS — I472 Ventricular tachycardia, unspecified: Secondary | ICD-10-CM | POA: Diagnosis not present

## 2023-06-04 DIAGNOSIS — I5022 Chronic systolic (congestive) heart failure: Secondary | ICD-10-CM

## 2023-06-04 LAB — CUP PACEART INCLINIC DEVICE CHECK
Battery Remaining Longevity: 7 mo
Brady Statistic RA Percent Paced: 9.2 %
Brady Statistic RV Percent Paced: 5.5 %
Date Time Interrogation Session: 20250430150041
HighPow Impedance: 70.875
Implantable Lead Connection Status: 753985
Implantable Lead Connection Status: 753985
Implantable Lead Implant Date: 20120420
Implantable Lead Implant Date: 20180613
Implantable Lead Location: 753859
Implantable Lead Location: 753860
Implantable Pulse Generator Implant Date: 20160212
Lead Channel Impedance Value: 350 Ohm
Lead Channel Impedance Value: 637.5 Ohm
Lead Channel Pacing Threshold Amplitude: 0.5 V
Lead Channel Pacing Threshold Amplitude: 0.5 V
Lead Channel Pacing Threshold Amplitude: 1 V
Lead Channel Pacing Threshold Amplitude: 1 V
Lead Channel Pacing Threshold Pulse Width: 0.5 ms
Lead Channel Pacing Threshold Pulse Width: 0.5 ms
Lead Channel Pacing Threshold Pulse Width: 0.5 ms
Lead Channel Pacing Threshold Pulse Width: 0.5 ms
Lead Channel Sensing Intrinsic Amplitude: 11.6 mV
Lead Channel Sensing Intrinsic Amplitude: 2.6 mV
Lead Channel Setting Pacing Amplitude: 2 V
Lead Channel Setting Pacing Amplitude: 2.5 V
Lead Channel Setting Pacing Pulse Width: 0.5 ms
Lead Channel Setting Sensing Sensitivity: 0.5 mV
Pulse Gen Serial Number: 7225079

## 2023-06-04 NOTE — Patient Instructions (Signed)
 Medication Instructions:   Your physician recommends that you continue on your current medications as directed. Please refer to the Current Medication list given to you today.   *If you need a refill on your cardiac medications before your next appointment, please call your pharmacy*  Lab Work: NONE ORDERED  TODAY     If you have labs (blood work) drawn today and your tests are completely normal, you will receive your results only by: MyChart Message (if you have MyChart) OR A paper copy in the mail If you have any lab test that is abnormal or we need to change your treatment, we will call you to review the results.  Testing/Procedures: NONE ORDERED  TODAY      Follow-Up: At [facility], you and your health needs are our priority.  As part of our continuing mission to provide you with exceptional heart care, our providers are all part of one team.  This team includes your primary Cardiologist (physician) and Advanced Practice Providers or APPs (Physician Assistants and Nurse Practitioners) who all work together to provide you with the care you need, when you need it.  Your next appointment:    5 -6 month(s)   Provider:    You may see  Dr Arlester Ladd , MD or one of the following Advanced Practice Providers on your designated Care Team:   Mertha Abrahams, [place] Bambi Lever "Jonelle Neri" [place], PA-C Suzann Riddle, NP Creighton Doffing, NP     We recommend signing up for the patient portal called "MyChart".  Sign up information is provided on this After Visit Summary.  MyChart is used to connect with patients for Virtual Visits (Telemedicine).  Patients are able to view lab/test results, encounter notes, upcoming appointments, etc.  Non-urgent messages can be sent to your provider as well.   To learn more about what you can do with MyChart, go to [email or web address].   Other Instructions

## 2023-06-06 NOTE — Addendum Note (Signed)
 Addended by: Lott Rouleau A on: 06/06/2023 12:25 PM   Modules accepted: Orders

## 2023-06-06 NOTE — Progress Notes (Signed)
 Remote ICD transmission.

## 2023-06-10 ENCOUNTER — Inpatient Hospital Stay: Payer: Medicare HMO | Admitting: Hematology and Oncology

## 2023-06-10 ENCOUNTER — Encounter: Payer: Self-pay | Admitting: Hematology and Oncology

## 2023-06-10 ENCOUNTER — Inpatient Hospital Stay: Payer: Medicare HMO | Attending: Hematology and Oncology

## 2023-06-10 ENCOUNTER — Inpatient Hospital Stay: Payer: Medicare HMO

## 2023-06-10 VITALS — BP 121/62 | HR 73 | Temp 97.7°F | Resp 18 | Ht 70.0 in | Wt 134.4 lb

## 2023-06-10 DIAGNOSIS — Z5112 Encounter for antineoplastic immunotherapy: Secondary | ICD-10-CM | POA: Insufficient documentation

## 2023-06-10 DIAGNOSIS — R748 Abnormal levels of other serum enzymes: Secondary | ICD-10-CM

## 2023-06-10 DIAGNOSIS — Z9221 Personal history of antineoplastic chemotherapy: Secondary | ICD-10-CM | POA: Diagnosis not present

## 2023-06-10 DIAGNOSIS — G8929 Other chronic pain: Secondary | ICD-10-CM

## 2023-06-10 DIAGNOSIS — C3491 Malignant neoplasm of unspecified part of right bronchus or lung: Secondary | ICD-10-CM

## 2023-06-10 DIAGNOSIS — Z79899 Other long term (current) drug therapy: Secondary | ICD-10-CM | POA: Diagnosis not present

## 2023-06-10 DIAGNOSIS — Z923 Personal history of irradiation: Secondary | ICD-10-CM | POA: Diagnosis not present

## 2023-06-10 DIAGNOSIS — M545 Low back pain, unspecified: Secondary | ICD-10-CM

## 2023-06-10 LAB — CBC WITH DIFFERENTIAL (CANCER CENTER ONLY)
Abs Immature Granulocytes: 0.04 10*3/uL (ref 0.00–0.07)
Basophils Absolute: 0.1 10*3/uL (ref 0.0–0.1)
Basophils Relative: 1 %
Eosinophils Absolute: 0.2 10*3/uL (ref 0.0–0.5)
Eosinophils Relative: 2 %
HCT: 43.3 % (ref 39.0–52.0)
Hemoglobin: 14.7 g/dL (ref 13.0–17.0)
Immature Granulocytes: 1 %
Lymphocytes Relative: 12 %
Lymphs Abs: 0.9 10*3/uL (ref 0.7–4.0)
MCH: 30.6 pg (ref 26.0–34.0)
MCHC: 33.9 g/dL (ref 30.0–36.0)
MCV: 90 fL (ref 80.0–100.0)
Monocytes Absolute: 1 10*3/uL (ref 0.1–1.0)
Monocytes Relative: 13 %
Neutro Abs: 5.4 10*3/uL (ref 1.7–7.7)
Neutrophils Relative %: 71 %
Platelet Count: 177 10*3/uL (ref 150–400)
RBC: 4.81 MIL/uL (ref 4.22–5.81)
RDW: 16.6 % — ABNORMAL HIGH (ref 11.5–15.5)
WBC Count: 7.5 10*3/uL (ref 4.0–10.5)
nRBC: 0 % (ref 0.0–0.2)

## 2023-06-10 LAB — CMP (CANCER CENTER ONLY)
ALT: 47 U/L — ABNORMAL HIGH (ref 0–44)
AST: 52 U/L — ABNORMAL HIGH (ref 15–41)
Albumin: 3.9 g/dL (ref 3.5–5.0)
Alkaline Phosphatase: 82 U/L (ref 38–126)
Anion gap: 5 (ref 5–15)
BUN: 12 mg/dL (ref 8–23)
CO2: 28 mmol/L (ref 22–32)
Calcium: 8.9 mg/dL (ref 8.9–10.3)
Chloride: 107 mmol/L (ref 98–111)
Creatinine: 1.13 mg/dL (ref 0.61–1.24)
GFR, Estimated: >60 mL/min (ref >60)
Glucose, Bld: 117 mg/dL — ABNORMAL HIGH (ref 70–99)
Potassium: 3.7 mmol/L (ref 3.5–5.1)
Sodium: 140 mmol/L (ref 135–145)
Total Bilirubin: 1 mg/dL (ref 0.0–1.2)
Total Protein: 7.1 g/dL (ref 6.5–8.1)

## 2023-06-10 LAB — TSH: TSH: 1.5 u[IU]/mL (ref 0.350–4.500)

## 2023-06-10 MED ORDER — OXYCODONE HCL 5 MG PO TABS: 5.0000 mg | ORAL_TABLET | Freq: Four times a day (QID) | ORAL | 0 refills | Status: DC | PRN

## 2023-06-10 MED ORDER — SODIUM CHLORIDE 0.9 % IV SOLN
Freq: Once | INTRAVENOUS | Status: AC
Start: 2023-06-10 — End: 2023-06-10

## 2023-06-10 MED ORDER — SODIUM CHLORIDE 0.9% FLUSH
10.0000 mL | Freq: Once | INTRAVENOUS | Status: AC
  Administered 2023-06-10: 10 mL

## 2023-06-10 MED ORDER — LIDOCAINE-PRILOCAINE 2.5-2.5 % EX CREA: 1.0000 | TOPICAL_CREAM | CUTANEOUS | 3 refills | Status: DC | PRN

## 2023-06-10 MED ORDER — DURVALUMAB 500 MG/10ML IV SOLN
1500.0000 mg | Freq: Once | INTRAVENOUS | Status: AC
  Administered 2023-06-10: 1500 mg via INTRAVENOUS
  Filled 2023-06-10: qty 30

## 2023-06-10 NOTE — Assessment & Plan Note (Addendum)
 He has chronic lower back pain for a long time He has been taking intermittent doses of oxycodone  and Tylenol  as needed I recommend he continues the same He has no major side effects from these medications I refilled his prescription of oxycodone  today

## 2023-06-10 NOTE — Assessment & Plan Note (Addendum)
 Cause unknown, could be related to his medication Observe only

## 2023-06-10 NOTE — Progress Notes (Signed)
 [facility] OFFICE PROGRESS NOTE  Patient Care Team: Benjamin Gander, MD as PCP - General (Family Medicine) Audery Blazing Deannie Fabian, MD as PCP - Cardiology (Cardiology) Mealor, Donnamae Gaba, [name]  Assessment & Plan Non-small cell cancer of right lung [name]) He was diagnosed with stage III non-small cell lung cancer in May 2024 Pathology: Non-small cell lung cancer, limited material for subtype, Foundation One test showed no actionable mutations. 6TMB, MSI low  It was unresectable at the time of diagnosis He received concurrent chemotherapy with combination of carboplatin  and paclitaxel  along with radiation treatment, followed by maintenance durvalumab  since August 2024  He tolerated durvalumab  well, we will continue maintenance treatment every 4 weeks I plan to repeat imaging study in July Elevated liver enzymes Cause unknown, could be related to his medication Observe only Chronic midline low back pain without sciatica He has chronic lower back pain for a long time He has been taking intermittent doses of oxycodone  and Tylenol  as needed I recommend he continues the same He has no major side effects from these medications I refilled his prescription of oxycodone  today  Orders Placed This Encounter  Procedures   CBC with Differential (Cancer Center Only)    Standing Status:   Future    Expected Date:   08/05/2023    Expiration Date:   08/04/2024   CMP (Cancer Center only)    Standing Status:   Future    Expected Date:   08/05/2023    Expiration Date:   08/04/2024   T4    Standing Status:   Future    Expected Date:   08/05/2023    Expiration Date:   08/04/2024   TSH    Standing Status:   Future    Expected Date:   08/05/2023    Expiration Date:   08/04/2024     Almeda Jacobs, MD  INTERVAL HISTORY: he returns for treatment follow-up Complications related to previous cycle of chemotherapy included bone aches, and elevated liver enzymes He  denies recent cough, chest pain or shortness of breath Overall, he tolerated well We discussed timing of his next imaging and management of his chronic pain His pain is at the lower back without sciatica radiation  PHYSICAL EXAMINATION: ECOG PERFORMANCE STATUS: 1 - Symptomatic but completely ambulatory  Vitals:   06/10/23 1233  BP: 121/62  Pulse: 73  Resp: 18  Temp: 97.7 F (36.5 C)  SpO2: 100%   Filed Weights   06/10/23 1233  Weight: 134 lb 6.4 oz (61 kg)    Relevant data reviewed during this visit included CBC, CMP and thyroid  function test

## 2023-06-10 NOTE — Assessment & Plan Note (Addendum)
 He was diagnosed with stage III non-small cell lung cancer in May 2024 Pathology: Non-small cell lung cancer, limited material for subtype, Foundation One test showed no actionable mutations. 6TMB, MSI low  It was unresectable at the time of diagnosis He received concurrent chemotherapy with combination of carboplatin and paclitaxel along with radiation treatment, followed by maintenance durvalumab since August 2024  He tolerated durvalumab well, we will continue maintenance treatment every 4 weeks I plan to repeat imaging study in July

## 2023-06-10 NOTE — Patient Instructions (Signed)

## 2023-06-11 LAB — T4: T4, Total: 13.2 ug/dL — ABNORMAL HIGH (ref 4.5–12.0)

## 2023-06-20 ENCOUNTER — Encounter

## 2023-06-23 ENCOUNTER — Ambulatory Visit (INDEPENDENT_AMBULATORY_CARE_PROVIDER_SITE_OTHER)

## 2023-06-23 DIAGNOSIS — I255 Ischemic cardiomyopathy: Secondary | ICD-10-CM

## 2023-06-23 DIAGNOSIS — I5022 Chronic systolic (congestive) heart failure: Secondary | ICD-10-CM

## 2023-06-24 ENCOUNTER — Ambulatory Visit: Payer: Self-pay | Admitting: Internal Medicine

## 2023-06-24 LAB — CUP PACEART REMOTE DEVICE CHECK
Battery Remaining Longevity: 5 mo
Battery Remaining Percentage: 5 %
Battery Voltage: 2.63 V
Brady Statistic AP VP Percent: 12 %
Brady Statistic AP VS Percent: 2 %
Brady Statistic AS VP Percent: 1.5 %
Brady Statistic AS VS Percent: 85 %
Brady Statistic RA Percent Paced: 14 %
Brady Statistic RV Percent Paced: 13 %
Date Time Interrogation Session: 20250519033348
HighPow Impedance: 70 Ohm
HighPow Impedance: 70 Ohm
Implantable Lead Connection Status: 753985
Implantable Lead Connection Status: 753985
Implantable Lead Implant Date: 20120420
Implantable Lead Implant Date: 20180613
Implantable Lead Location: 753859
Implantable Lead Location: 753860
Implantable Pulse Generator Implant Date: 20160212
Lead Channel Impedance Value: 340 Ohm
Lead Channel Impedance Value: 600 Ohm
Lead Channel Pacing Threshold Amplitude: 0.5 V
Lead Channel Pacing Threshold Amplitude: 1 V
Lead Channel Pacing Threshold Pulse Width: 0.5 ms
Lead Channel Pacing Threshold Pulse Width: 0.5 ms
Lead Channel Sensing Intrinsic Amplitude: 1.6 mV
Lead Channel Sensing Intrinsic Amplitude: 11.6 mV
Lead Channel Setting Pacing Amplitude: 2 V
Lead Channel Setting Pacing Amplitude: 2.5 V
Lead Channel Setting Pacing Pulse Width: 0.5 ms
Lead Channel Setting Sensing Sensitivity: 0.5 mV
Pulse Gen Serial Number: 7225079

## 2023-07-04 ENCOUNTER — Other Ambulatory Visit: Payer: Self-pay | Admitting: Cardiology

## 2023-07-08 ENCOUNTER — Encounter: Payer: Self-pay | Admitting: Hematology and Oncology

## 2023-07-08 ENCOUNTER — Inpatient Hospital Stay: Attending: Hematology and Oncology

## 2023-07-08 ENCOUNTER — Inpatient Hospital Stay

## 2023-07-08 ENCOUNTER — Inpatient Hospital Stay (HOSPITAL_BASED_OUTPATIENT_CLINIC_OR_DEPARTMENT_OTHER): Admitting: Hematology and Oncology

## 2023-07-08 VITALS — BP 114/63 | HR 69 | Temp 97.5°F | Resp 18 | Ht 70.0 in | Wt 137.0 lb

## 2023-07-08 DIAGNOSIS — Z79899 Other long term (current) drug therapy: Secondary | ICD-10-CM | POA: Insufficient documentation

## 2023-07-08 DIAGNOSIS — D696 Thrombocytopenia, unspecified: Secondary | ICD-10-CM | POA: Insufficient documentation

## 2023-07-08 DIAGNOSIS — C3491 Malignant neoplasm of unspecified part of right bronchus or lung: Secondary | ICD-10-CM | POA: Insufficient documentation

## 2023-07-08 DIAGNOSIS — M545 Low back pain, unspecified: Secondary | ICD-10-CM

## 2023-07-08 DIAGNOSIS — Z5112 Encounter for antineoplastic immunotherapy: Secondary | ICD-10-CM | POA: Insufficient documentation

## 2023-07-08 DIAGNOSIS — G8929 Other chronic pain: Secondary | ICD-10-CM | POA: Diagnosis not present

## 2023-07-08 LAB — CBC WITH DIFFERENTIAL (CANCER CENTER ONLY)
Abs Immature Granulocytes: 0.02 10*3/uL (ref 0.00–0.07)
Basophils Absolute: 0 10*3/uL (ref 0.0–0.1)
Basophils Relative: 1 %
Eosinophils Absolute: 0.1 10*3/uL (ref 0.0–0.5)
Eosinophils Relative: 2 %
HCT: 40.2 % (ref 39.0–52.0)
Hemoglobin: 13.5 g/dL (ref 13.0–17.0)
Immature Granulocytes: 0 %
Lymphocytes Relative: 11 %
Lymphs Abs: 0.7 10*3/uL (ref 0.7–4.0)
MCH: 30.8 pg (ref 26.0–34.0)
MCHC: 33.6 g/dL (ref 30.0–36.0)
MCV: 91.8 fL (ref 80.0–100.0)
Monocytes Absolute: 0.7 10*3/uL (ref 0.1–1.0)
Monocytes Relative: 12 %
Neutro Abs: 4.4 10*3/uL (ref 1.7–7.7)
Neutrophils Relative %: 74 %
Platelet Count: 139 10*3/uL — ABNORMAL LOW (ref 150–400)
RBC: 4.38 MIL/uL (ref 4.22–5.81)
RDW: 16.5 % — ABNORMAL HIGH (ref 11.5–15.5)
WBC Count: 5.9 10*3/uL (ref 4.0–10.5)
nRBC: 0 % (ref 0.0–0.2)

## 2023-07-08 LAB — CMP (CANCER CENTER ONLY)
ALT: 41 U/L (ref 0–44)
AST: 45 U/L — ABNORMAL HIGH (ref 15–41)
Albumin: 3.6 g/dL (ref 3.5–5.0)
Alkaline Phosphatase: 75 U/L (ref 38–126)
Anion gap: 6 (ref 5–15)
BUN: 10 mg/dL (ref 8–23)
CO2: 28 mmol/L (ref 22–32)
Calcium: 8.7 mg/dL — ABNORMAL LOW (ref 8.9–10.3)
Chloride: 106 mmol/L (ref 98–111)
Creatinine: 0.97 mg/dL (ref 0.61–1.24)
GFR, Estimated: >60 mL/min (ref >60)
Glucose, Bld: 161 mg/dL — ABNORMAL HIGH (ref 70–99)
Potassium: 3.3 mmol/L — ABNORMAL LOW (ref 3.5–5.1)
Sodium: 140 mmol/L (ref 135–145)
Total Bilirubin: 1 mg/dL (ref 0.0–1.2)
Total Protein: 6.8 g/dL (ref 6.5–8.1)

## 2023-07-08 LAB — TSH: TSH: 1.34 u[IU]/mL (ref 0.350–4.500)

## 2023-07-08 MED ORDER — OXYCODONE HCL 5 MG PO TABS: 5.0000 mg | ORAL_TABLET | Freq: Four times a day (QID) | ORAL | 0 refills | Status: DC | PRN

## 2023-07-08 MED ORDER — SODIUM CHLORIDE 0.9% FLUSH
10.0000 mL | INTRAVENOUS | Status: DC | PRN
  Administered 2023-07-08: 10 mL

## 2023-07-08 MED ORDER — SODIUM CHLORIDE 0.9 % IV SOLN: Freq: Once | INTRAVENOUS | Status: AC

## 2023-07-08 MED ORDER — SODIUM CHLORIDE 0.9% FLUSH
10.0000 mL | Freq: Once | INTRAVENOUS | Status: AC
  Administered 2023-07-08: 10 mL

## 2023-07-08 MED ORDER — SODIUM CHLORIDE 0.9 % IV SOLN
1500.0000 mg | Freq: Once | INTRAVENOUS | Status: AC
  Administered 2023-07-08: 1500 mg via INTRAVENOUS
  Filled 2023-07-08: qty 30

## 2023-07-08 MED ORDER — HEPARIN SOD (PORK) LOCK FLUSH 100 UNIT/ML IV SOLN
500.0000 [IU] | Freq: Once | INTRAVENOUS | Status: AC | PRN
  Administered 2023-07-08: 500 [IU]

## 2023-07-08 NOTE — Assessment & Plan Note (Addendum)
 He has chronic lower back pain for a long time He has been taking intermittent doses of oxycodone  and Tylenol  as needed I recommend he continues the same He has no major side effects from these medications I refilled his prescription of oxycodone  today

## 2023-07-08 NOTE — Assessment & Plan Note (Addendum)
 He was diagnosed with stage III non-small cell lung cancer in May 2024 Pathology: Non-small cell lung cancer, limited material for subtype, Foundation One test showed no actionable mutations. 6TMB, MSI low  It was unresectable at the time of diagnosis He received concurrent chemotherapy with combination of carboplatin  and paclitaxel  along with radiation treatment, followed by maintenance durvalumab  since August 2024  He tolerated durvalumab  well, we will continue maintenance treatment every 4 weeks I plan to repeat imaging study end of this month to reassess response to therapy

## 2023-07-08 NOTE — Progress Notes (Signed)
 Benjamin Cook OFFICE PROGRESS NOTE  Patient Care Team: Ulysees Gander, MD as PCP - General (Family Medicine) Audery Blazing Deannie Fabian, MD as PCP - Cardiology (Cardiology) Mealor, Donnamae Gaba, [name]  Assessment & Plan Non-small cell cancer of right lung [facility]) He was diagnosed with stage III non-small cell lung cancer in May 2024 Pathology: Non-small cell lung cancer, limited material for subtype, Foundation One test showed no actionable mutations. 6TMB, MSI low  It was unresectable at the time of diagnosis He received concurrent chemotherapy with combination of carboplatin  and paclitaxel  along with radiation treatment, followed by maintenance durvalumab  since August 2024  He tolerated durvalumab  well, we will continue maintenance treatment every 4 weeks I plan to repeat imaging study end of this month to reassess response to therapy Chronic midline low back pain without sciatica He has chronic lower back pain for a long time He has been taking intermittent doses of oxycodone  and Tylenol  as needed I recommend he continues the same He has no major side effects from these medications I refilled his prescription of oxycodone  today Thrombocytopenia ([name]) This is likely due to recent treatment. The patient denies recent history of bleeding such as epistaxis, hematuria or hematochezia. He is asymptomatic from the low platelet count. I will observe for now.   Orders Placed This Encounter  Procedures   NM PET Image Restage (PS) Skull Base to Thigh (F-18 FDG)    Standing Status:   Future    Expected Date:   07/29/2023    Expiration Date:   07/07/2024    If indicated for the ordered procedure, I authorize the administration of a radiopharmaceutical per Radiology protocol:   Yes    Preferred imaging location?:   [facility]    Radiology Contrast Protocol - do NOT remove file path:   [email or web address]      Almeda Jacobs, MD  INTERVAL HISTORY: he returns for treatment follow-up Complications related to previous cycle of chemotherapy included thrombocytopenia, and bone aches, He denies recent cough, chest pain or shortness of breath We discussed timing of imaging and duration of future treatment  PHYSICAL EXAMINATION: ECOG PERFORMANCE STATUS: 1 - Symptomatic but completely ambulatory  No results found for: "CAN125"    Latest Ref Rng & Units 07/08/2023   11:52 AM 06/10/2023   12:04 PM 05/13/2023   11:35 AM  CBC  WBC 4.0 - 10.5 K/uL 5.9  7.5  7.8   Hemoglobin 13.0 - 17.0 g/dL 16.1  09.6  04.5   Hematocrit 39.0 - 52.0 % 40.2  43.3  40.9   Platelets 150 - 400 K/uL 139  177  181       Chemistry      Component Value Date/Time   NA 140 06/10/2023 1204   NA 138 04/15/2022 0913   K 3.7 06/10/2023 1204   CL 107 06/10/2023 1204   CO2 28 06/10/2023 1204   BUN 12 06/10/2023 1204   BUN 7 (L) 04/15/2022 0913   CREATININE 1.13 06/10/2023 1204   CREATININE 0.88 09/15/2015 0959      Component Value Date/Time   CALCIUM  8.9 06/10/2023 1204   ALKPHOS 82 06/10/2023 1204   AST 52 (H) 06/10/2023 1204   ALT 47 (H) 06/10/2023 1204   BILITOT 1.0 06/10/2023 1204       Vitals:   07/08/23 1213  BP: 114/63  Pulse: 69  Resp: 18  Temp: (!) 97.5 F (36.4 C)  SpO2: 98%  Filed Weights   07/08/23 1213  Weight: 137 lb (62.1 kg)   Other relevant data reviewed during this visit included CBC, CMP and TSH

## 2023-07-08 NOTE — Patient Instructions (Signed)

## 2023-07-08 NOTE — Assessment & Plan Note (Addendum)
This is likely due to recent treatment. The patient denies recent history of bleeding such as epistaxis, hematuria or hematochezia. He is asymptomatic from the low platelet count. I will observe for now.  

## 2023-07-09 LAB — T4: T4, Total: 14.2 ug/dL — ABNORMAL HIGH (ref 4.5–12.0)

## 2023-07-17 ENCOUNTER — Other Ambulatory Visit: Payer: Self-pay | Admitting: Hematology and Oncology

## 2023-07-18 ENCOUNTER — Encounter: Payer: Self-pay | Admitting: Hematology and Oncology

## 2023-07-21 ENCOUNTER — Encounter

## 2023-07-24 ENCOUNTER — Ambulatory Visit (INDEPENDENT_AMBULATORY_CARE_PROVIDER_SITE_OTHER)

## 2023-07-24 DIAGNOSIS — I255 Ischemic cardiomyopathy: Secondary | ICD-10-CM

## 2023-07-24 LAB — CUP PACEART REMOTE DEVICE CHECK
Battery Remaining Longevity: 6 mo
Battery Remaining Percentage: 6 %
Battery Voltage: 2.63 V
Brady Statistic AP VP Percent: 14 %
Brady Statistic AP VS Percent: 3.6 %
Brady Statistic AS VP Percent: 2 %
Brady Statistic AS VS Percent: 80 %
Brady Statistic RA Percent Paced: 17 %
Brady Statistic RV Percent Paced: 16 %
Date Time Interrogation Session: 20250619040019
HighPow Impedance: 66 Ohm
HighPow Impedance: 66 Ohm
Implantable Lead Connection Status: 753985
Implantable Lead Connection Status: 753985
Implantable Lead Implant Date: 20120420
Implantable Lead Implant Date: 20180613
Implantable Lead Location: 753860
Implantable Lead Location: 753859
Implantable Pulse Generator Implant Date: 20160212
Lead Channel Impedance Value: 340 Ohm
Lead Channel Impedance Value: 580 Ohm
Lead Channel Pacing Threshold Amplitude: 0.5 V
Lead Channel Pacing Threshold Amplitude: 1 V
Lead Channel Pacing Threshold Pulse Width: 0.5 ms
Lead Channel Pacing Threshold Pulse Width: 0.5 ms
Lead Channel Sensing Intrinsic Amplitude: 11.6 mV
Lead Channel Sensing Intrinsic Amplitude: 2.3 mV
Lead Channel Setting Pacing Amplitude: 2 V
Lead Channel Setting Pacing Amplitude: 2.5 V
Lead Channel Setting Pacing Pulse Width: 0.5 ms
Lead Channel Setting Sensing Sensitivity: 0.5 mV
Pulse Gen Serial Number: 7225079

## 2023-07-27 ENCOUNTER — Ambulatory Visit: Payer: Self-pay | Admitting: Internal Medicine

## 2023-07-31 ENCOUNTER — Ambulatory Visit (HOSPITAL_COMMUNITY)
Admission: RE | Admit: 2023-07-31 | Discharge: 2023-07-31 | Disposition: A | Discharge status: Home / Self Care | Source: Ambulatory Visit | Attending: Hematology and Oncology | Admitting: Hematology and Oncology

## 2023-07-31 DIAGNOSIS — C3491 Malignant neoplasm of unspecified part of right bronchus or lung: Secondary | ICD-10-CM | POA: Insufficient documentation

## 2023-07-31 LAB — GLUCOSE, CAPILLARY: Glucose-Capillary: 105 mg/dL — ABNORMAL HIGH (ref 70–99)

## 2023-07-31 MED ORDER — FLUDEOXYGLUCOSE F - 18 (FDG) INJECTION
7.6000 | Freq: Once | INTRAVENOUS | Status: AC
  Administered 2023-07-31: 7.6 via INTRAVENOUS

## 2023-08-05 ENCOUNTER — Encounter: Payer: Self-pay | Admitting: Hematology and Oncology

## 2023-08-05 ENCOUNTER — Inpatient Hospital Stay (HOSPITAL_BASED_OUTPATIENT_CLINIC_OR_DEPARTMENT_OTHER): Admitting: Hematology and Oncology

## 2023-08-05 ENCOUNTER — Inpatient Hospital Stay

## 2023-08-05 ENCOUNTER — Inpatient Hospital Stay: Attending: Hematology and Oncology

## 2023-08-05 VITALS — BP 123/62 | HR 52 | Temp 98.1°F | Resp 18 | Ht 70.0 in | Wt 132.0 lb

## 2023-08-05 DIAGNOSIS — C3491 Malignant neoplasm of unspecified part of right bronchus or lung: Secondary | ICD-10-CM | POA: Insufficient documentation

## 2023-08-05 DIAGNOSIS — R748 Abnormal levels of other serum enzymes: Secondary | ICD-10-CM | POA: Diagnosis not present

## 2023-08-05 DIAGNOSIS — Z79899 Other long term (current) drug therapy: Secondary | ICD-10-CM | POA: Diagnosis not present

## 2023-08-05 DIAGNOSIS — Z5112 Encounter for antineoplastic immunotherapy: Secondary | ICD-10-CM | POA: Diagnosis present

## 2023-08-05 LAB — CBC WITH DIFFERENTIAL (CANCER CENTER ONLY)
Abs Immature Granulocytes: 0.04 10*3/uL (ref 0.00–0.07)
Basophils Absolute: 0 10*3/uL (ref 0.0–0.1)
Basophils Relative: 1 %
Eosinophils Absolute: 0.1 10*3/uL (ref 0.0–0.5)
Eosinophils Relative: 2 %
HCT: 40 % (ref 39.0–52.0)
Hemoglobin: 13.8 g/dL (ref 13.0–17.0)
Immature Granulocytes: 1 %
Lymphocytes Relative: 12 %
Lymphs Abs: 0.8 10*3/uL (ref 0.7–4.0)
MCH: 31.4 pg (ref 26.0–34.0)
MCHC: 34.5 g/dL (ref 30.0–36.0)
MCV: 91.1 fL (ref 80.0–100.0)
Monocytes Absolute: 0.9 10*3/uL (ref 0.1–1.0)
Monocytes Relative: 12 %
Neutro Abs: 5.1 10*3/uL (ref 1.7–7.7)
Neutrophils Relative %: 72 %
Platelet Count: 182 10*3/uL (ref 150–400)
RBC: 4.39 MIL/uL (ref 4.22–5.81)
RDW: 15.7 % — ABNORMAL HIGH (ref 11.5–15.5)
WBC Count: 6.9 10*3/uL (ref 4.0–10.5)
nRBC: 0 % (ref 0.0–0.2)

## 2023-08-05 LAB — CMP (CANCER CENTER ONLY)
ALT: 46 U/L — ABNORMAL HIGH (ref 0–44)
AST: 44 U/L — ABNORMAL HIGH (ref 15–41)
Albumin: 3.6 g/dL (ref 3.5–5.0)
Alkaline Phosphatase: 74 U/L (ref 38–126)
Anion gap: 5 (ref 5–15)
BUN: 12 mg/dL (ref 8–23)
CO2: 28 mmol/L (ref 22–32)
Calcium: 8.9 mg/dL (ref 8.9–10.3)
Chloride: 106 mmol/L (ref 98–111)
Creatinine: 0.95 mg/dL (ref 0.61–1.24)
GFR, Estimated: >60 mL/min (ref >60)
Glucose, Bld: 111 mg/dL — ABNORMAL HIGH (ref 70–99)
Potassium: 3.6 mmol/L (ref 3.5–5.1)
Sodium: 139 mmol/L (ref 135–145)
Total Bilirubin: 0.9 mg/dL (ref 0.0–1.2)
Total Protein: 6.8 g/dL (ref 6.5–8.1)

## 2023-08-05 LAB — TSH: TSH: 1.5 u[IU]/mL (ref 0.350–4.500)

## 2023-08-05 MED ORDER — SODIUM CHLORIDE 0.9 % IV SOLN
1500.0000 mg | Freq: Once | INTRAVENOUS | Status: AC
  Administered 2023-08-05: 1500 mg via INTRAVENOUS
  Filled 2023-08-05: qty 30

## 2023-08-05 MED ORDER — SODIUM CHLORIDE 0.9 % IV SOLN: Freq: Once | INTRAVENOUS | Status: AC

## 2023-08-05 MED ORDER — HEPARIN SOD (PORK) LOCK FLUSH 100 UNIT/ML IV SOLN
500.0000 [IU] | Freq: Once | INTRAVENOUS | Status: DC | PRN
Start: 2023-08-05 — End: 2023-08-05

## 2023-08-05 MED ORDER — SODIUM CHLORIDE 0.9% FLUSH
10.0000 mL | Freq: Once | INTRAVENOUS | Status: AC
  Administered 2023-08-05: 10 mL

## 2023-08-05 MED ORDER — SODIUM CHLORIDE 0.9% FLUSH: 10.0000 mL | INTRAVENOUS | Status: DC | PRN

## 2023-08-05 NOTE — Progress Notes (Signed)
 [facility] OFFICE PROGRESS NOTE  Patient Care Team: Maree Leni Edyth DELENA, MD as PCP - General (Family Medicine) Pietro Redell RAMAN, MD as PCP - Cardiology (Cardiology) Mealor, Eulas BRAVO, [name]  Assessment & Plan Non-small cell cancer of right lung [facility]) He was diagnosed with stage III non-small cell lung cancer in May 2024 Pathology: Non-small cell lung cancer, limited material for subtype, Foundation One test showed no actionable mutations. 6TMB, MSI low  It was unresectable at the time of diagnosis He received concurrent chemotherapy with combination of carboplatin  and paclitaxel  along with radiation treatment, followed by maintenance durvalumab  since August 2024  He tolerated durvalumab  well, without major side effects I have reviewed multiple PET/CT imaging with the patient and family which showed no definitive evidence of cancer recurrence We discussed risk and benefits of continuing on maintenance durvalumab  versus stopping and the patient would like to continue treatment every 4 weeks I plan to repeat imaging study again at the end of the year around November Elevated liver enzymes Cause unknown, could be related to his medication Observe only  Orders Placed This Encounter  Procedures   CBC with Differential (Cancer Center Only)    Standing Status:   Future    Expected Date:   09/02/2023    Expiration Date:   09/01/2024   CMP (Cancer Center only)    Standing Status:   Future    Expected Date:   09/02/2023    Expiration Date:   09/01/2024   T4    Standing Status:   Future    Expected Date:   09/02/2023    Expiration Date:   09/01/2024   TSH    Standing Status:   Future    Expected Date:   09/02/2023    Expiration Date:   09/01/2024   CBC with Differential (Cancer Center Only)    Standing Status:   Future    Expected Date:   09/30/2023    Expiration Date:   09/29/2024   CMP (Cancer Center only)    Standing Status:   Future     Expected Date:   09/30/2023    Expiration Date:   09/29/2024   T4    Standing Status:   Future    Expected Date:   09/30/2023    Expiration Date:   09/29/2024   TSH    Standing Status:   Future    Expected Date:   09/30/2023    Expiration Date:   09/29/2024     [name], MD  INTERVAL HISTORY: he returns for treatment follow-up Complications related to previous cycle of chemotherapy included abnormal liver enzymes He has occasional cough No hemoptysis Denies shortness of breath We discussed imaging studies and plan of care  PHYSICAL EXAMINATION: ECOG PERFORMANCE STATUS: 1 - Symptomatic but completely ambulatory  No results found for: CAN125    Latest Ref Rng & Units 08/05/2023   11:39 AM 07/08/2023   11:52 AM 06/10/2023   12:04 PM  CBC  WBC 4.0 - 10.5 K/uL 6.9  5.9  7.5   Hemoglobin 13.0 - 17.0 g/dL 86.1  86.4  85.2   Hematocrit 39.0 - 52.0 % 40.0  40.2  43.3   Platelets 150 - 400 K/uL 182  139  177       Chemistry      Component Value Date/Time   NA 139 08/05/2023 1139   NA 138 04/15/2022 0913   K 3.6 08/05/2023 1139  CL 106 08/05/2023 1139   CO2 28 08/05/2023 1139   BUN 12 08/05/2023 1139   BUN 7 (L) 04/15/2022 0913   CREATININE 0.95 08/05/2023 1139   CREATININE 0.88 09/15/2015 0959      Component Value Date/Time   CALCIUM  8.9 08/05/2023 1139   ALKPHOS 74 08/05/2023 1139   AST 44 (H) 08/05/2023 1139   ALT 46 (H) 08/05/2023 1139   BILITOT 0.9 08/05/2023 1139       Vitals:   08/05/23 1201  BP: 123/62  Pulse: (!) 52  Resp: 18  Temp: 98.1 F (36.7 C)  SpO2: 100%   Filed Weights   08/05/23 1201  Weight: 132 lb (59.9 kg)   Other relevant data reviewed during this visit included CBC, CMP, PET/CT imaging from June 2025, August 2024 and January 2025

## 2023-08-05 NOTE — Assessment & Plan Note (Addendum)
 Cause unknown, could be related to his medication Observe only

## 2023-08-05 NOTE — Assessment & Plan Note (Addendum)
 He was diagnosed with stage III non-small cell lung cancer in May 2024 Pathology: Non-small cell lung cancer, limited material for subtype, Foundation One test showed no actionable mutations. 6TMB, MSI low  It was unresectable at the time of diagnosis He received concurrent chemotherapy with combination of carboplatin  and paclitaxel  along with radiation treatment, followed by maintenance durvalumab  since August 2024  He tolerated durvalumab  well, without major side effects I have reviewed multiple PET/CT imaging with the patient and family which showed no definitive evidence of cancer recurrence We discussed risk and benefits of continuing on maintenance durvalumab  versus stopping and the patient would like to continue treatment every 4 weeks I plan to repeat imaging study again at the end of the year around November

## 2023-08-05 NOTE — Patient Instructions (Signed)

## 2023-08-06 ENCOUNTER — Other Ambulatory Visit: Payer: Self-pay | Admitting: Cardiology

## 2023-08-06 LAB — T4: T4, Total: 12.2 ug/dL — ABNORMAL HIGH (ref 4.5–12.0)

## 2023-08-12 NOTE — Progress Notes (Signed)
 Remote ICD transmission.

## 2023-08-12 NOTE — Addendum Note (Signed)
 Addended by: TAWNI DRILLING D on: 08/12/2023 01:21 PM   Modules accepted: Orders, Level of Service

## 2023-08-21 ENCOUNTER — Encounter

## 2023-08-25 ENCOUNTER — Ambulatory Visit

## 2023-08-26 LAB — CUP PACEART REMOTE DEVICE CHECK
Battery Remaining Longevity: 4 mo
Battery Remaining Percentage: 4 %
Battery Voltage: 2.62 V
Brady Statistic AP VP Percent: 11 %
Brady Statistic AP VS Percent: 4.6 %
Brady Statistic AS VP Percent: 2.2 %
Brady Statistic AS VS Percent: 82 %
Brady Statistic RA Percent Paced: 16 %
Brady Statistic RV Percent Paced: 13 %
Date Time Interrogation Session: 20250721020032
HighPow Impedance: 71 Ohm
HighPow Impedance: 71 Ohm
Implantable Lead Connection Status: 753985
Implantable Lead Connection Status: 753985
Implantable Lead Implant Date: 20120420
Implantable Lead Implant Date: 20180613
Implantable Lead Location: 753859
Implantable Lead Location: 753860
Implantable Pulse Generator Implant Date: 20160212
Lead Channel Impedance Value: 330 Ohm
Lead Channel Impedance Value: 530 Ohm
Lead Channel Pacing Threshold Amplitude: 0.5 V
Lead Channel Pacing Threshold Amplitude: 1 V
Lead Channel Pacing Threshold Pulse Width: 0.5 ms
Lead Channel Pacing Threshold Pulse Width: 0.5 ms
Lead Channel Sensing Intrinsic Amplitude: 1.1 mV
Lead Channel Sensing Intrinsic Amplitude: 11.6 mV
Lead Channel Setting Pacing Amplitude: 2 V
Lead Channel Setting Pacing Amplitude: 2.5 V
Lead Channel Setting Pacing Pulse Width: 0.5 ms
Lead Channel Setting Sensing Sensitivity: 0.5 mV
Pulse Gen Serial Number: 7225079

## 2023-08-27 ENCOUNTER — Ambulatory Visit: Payer: Self-pay | Admitting: Internal Medicine

## 2023-09-02 ENCOUNTER — Encounter: Payer: Self-pay | Admitting: Hematology and Oncology

## 2023-09-02 ENCOUNTER — Inpatient Hospital Stay

## 2023-09-02 ENCOUNTER — Inpatient Hospital Stay: Admitting: Hematology and Oncology

## 2023-09-02 VITALS — BP 114/58 | HR 64 | Temp 97.6°F | Resp 18 | Ht 70.0 in | Wt 135.4 lb

## 2023-09-02 VITALS — BP 120/67 | HR 71 | Resp 18 | Ht 70.0 in | Wt 135.4 lb

## 2023-09-02 DIAGNOSIS — R0789 Other chest pain: Secondary | ICD-10-CM

## 2023-09-02 DIAGNOSIS — C3491 Malignant neoplasm of unspecified part of right bronchus or lung: Secondary | ICD-10-CM

## 2023-09-02 DIAGNOSIS — Z5112 Encounter for antineoplastic immunotherapy: Secondary | ICD-10-CM | POA: Diagnosis not present

## 2023-09-02 DIAGNOSIS — R748 Abnormal levels of other serum enzymes: Secondary | ICD-10-CM | POA: Diagnosis not present

## 2023-09-02 LAB — CBC WITH DIFFERENTIAL (CANCER CENTER ONLY)
Abs Immature Granulocytes: 0.01 K/uL (ref 0.00–0.07)
Basophils Absolute: 0 K/uL (ref 0.0–0.1)
Basophils Relative: 1 %
Eosinophils Absolute: 0.2 K/uL (ref 0.0–0.5)
Eosinophils Relative: 3 %
HCT: 41.1 % (ref 39.0–52.0)
Hemoglobin: 13.9 g/dL (ref 13.0–17.0)
Immature Granulocytes: 0 %
Lymphocytes Relative: 12 %
Lymphs Abs: 0.7 K/uL (ref 0.7–4.0)
MCH: 31.4 pg (ref 26.0–34.0)
MCHC: 33.8 g/dL (ref 30.0–36.0)
MCV: 93 fL (ref 80.0–100.0)
Monocytes Absolute: 0.8 K/uL (ref 0.1–1.0)
Monocytes Relative: 13 %
Neutro Abs: 4.3 K/uL (ref 1.7–7.7)
Neutrophils Relative %: 71 %
Platelet Count: 158 K/uL (ref 150–400)
RBC: 4.42 MIL/uL (ref 4.22–5.81)
RDW: 15.8 % — ABNORMAL HIGH (ref 11.5–15.5)
WBC Count: 6 K/uL (ref 4.0–10.5)
nRBC: 0 % (ref 0.0–0.2)

## 2023-09-02 LAB — CMP (CANCER CENTER ONLY)
ALT: 29 U/L (ref 0–44)
AST: 37 U/L (ref 15–41)
Albumin: 3.5 g/dL (ref 3.5–5.0)
Alkaline Phosphatase: 73 U/L (ref 38–126)
Anion gap: 4 — ABNORMAL LOW (ref 5–15)
BUN: 12 mg/dL (ref 8–23)
CO2: 28 mmol/L (ref 22–32)
Calcium: 8.7 mg/dL — ABNORMAL LOW (ref 8.9–10.3)
Chloride: 108 mmol/L (ref 98–111)
Creatinine: 1.03 mg/dL (ref 0.61–1.24)
GFR, Estimated: >60 mL/min (ref >60)
Glucose, Bld: 127 mg/dL — ABNORMAL HIGH (ref 70–99)
Potassium: 3.8 mmol/L (ref 3.5–5.1)
Sodium: 140 mmol/L (ref 135–145)
Total Bilirubin: 0.7 mg/dL (ref 0.0–1.2)
Total Protein: 6.9 g/dL (ref 6.5–8.1)

## 2023-09-02 LAB — TSH: TSH: 1.94 u[IU]/mL (ref 0.350–4.500)

## 2023-09-02 MED ORDER — SODIUM CHLORIDE 0.9 % IV SOLN: Freq: Once | INTRAVENOUS | Status: AC

## 2023-09-02 MED ORDER — HEPARIN SOD (PORK) LOCK FLUSH 100 UNIT/ML IV SOLN
500.0000 [IU] | Freq: Once | INTRAVENOUS | Status: AC | PRN
  Administered 2023-09-02: 500 [IU]

## 2023-09-02 MED ORDER — SODIUM CHLORIDE 0.9% FLUSH
10.0000 mL | INTRAVENOUS | Status: DC | PRN
Start: 2023-09-02 — End: 2023-09-02
  Administered 2023-09-02: 10 mL

## 2023-09-02 MED ORDER — SODIUM CHLORIDE 0.9 % IV SOLN
1500.0000 mg | Freq: Once | INTRAVENOUS | Status: AC
  Administered 2023-09-02: 1500 mg via INTRAVENOUS
  Filled 2023-09-02: qty 30

## 2023-09-02 MED ORDER — OXYCODONE HCL 5 MG PO TABS: 5.0000 mg | ORAL_TABLET | Freq: Four times a day (QID) | ORAL | 0 refills | Status: DC | PRN

## 2023-09-02 MED ORDER — SODIUM CHLORIDE 0.9% FLUSH
10.0000 mL | Freq: Once | INTRAVENOUS | Status: AC
Start: 2023-09-02 — End: 2023-09-02
  Administered 2023-09-02: 10 mL

## 2023-09-02 NOTE — Patient Instructions (Signed)

## 2023-09-02 NOTE — Assessment & Plan Note (Addendum)
 He had extensive evaluation in the past  his examination is benign and most consistent with possible musculoskeletal pain It is alleviated by low-dose oxycodone  I refilled his prescription today

## 2023-09-02 NOTE — Progress Notes (Signed)
 [facility] OFFICE PROGRESS NOTE  Patient Care Team: Maree Leni Edyth DELENA, MD as PCP - General (Family Medicine) Pietro Redell RAMAN, MD as PCP - Cardiology (Cardiology) Mealor, Eulas BRAVO, [name]  Assessment & Plan Non-small cell cancer of right lung [facility]) He was diagnosed with stage III non-small cell lung cancer in May 2024 Pathology: Non-small cell lung cancer, limited material for subtype, Foundation One test showed no actionable mutations. 6TMB, MSI low  It was unresectable at the time of diagnosis He received concurrent chemotherapy with combination of carboplatin  and paclitaxel  along with radiation treatment, followed by maintenance durvalumab  since August 2024  He tolerated durvalumab  well, without major side effects PET/CT imaging in June 2025 showed no definitive evidence of cancer recurrence We discussed risk and benefits of continuing on maintenance durvalumab  versus stopping and the patient would like to continue treatment every 4 weeks I plan to repeat imaging study again at the end of the year around November Elevated liver enzymes Cause unknown, could be related to his medication Observe only Atypical chest pain He had extensive evaluation in the past  his examination is benign and most consistent with possible musculoskeletal pain It is alleviated by low-dose oxycodone  I refilled his prescription today  Orders Placed This Encounter  Procedures   CBC with Differential (Cancer Center Only)    Standing Status:   Future    Expected Date:   10/28/2023    Expiration Date:   10/27/2024   CMP (Cancer Center only)    Standing Status:   Future    Expected Date:   10/28/2023    Expiration Date:   10/27/2024   T4    Standing Status:   Future    Expected Date:   10/28/2023    Expiration Date:   10/27/2024   TSH    Standing Status:   Future    Expected Date:   10/28/2023    Expiration Date:   10/27/2024     [name],  MD  INTERVAL HISTORY: he returns for treatment follow-up Complications related to previous cycle of chemotherapy included fatigue, and abnormal liver enzymes, chest wall pain He requested pain medicine refill Overall, he is only needing oxycodone  half a tablet once or twice a day for pain management  PHYSICAL EXAMINATION: ECOG PERFORMANCE STATUS: 1 - Symptomatic but completely ambulatory  No results found for: CAN125    Latest Ref Rng & Units 09/02/2023   10:46 AM 08/05/2023   11:39 AM 07/08/2023   11:52 AM  CBC  WBC 4.0 - 10.5 K/uL 6.0  6.9  5.9   Hemoglobin 13.0 - 17.0 g/dL 86.0  86.1  86.4   Hematocrit 39.0 - 52.0 % 41.1  40.0  40.2   Platelets 150 - 400 K/uL 158  182  139       Chemistry      Component Value Date/Time   NA 139 08/05/2023 1139   NA 138 04/15/2022 0913   K 3.6 08/05/2023 1139   CL 106 08/05/2023 1139   CO2 28 08/05/2023 1139   BUN 12 08/05/2023 1139   BUN 7 (L) 04/15/2022 0913   CREATININE 0.95 08/05/2023 1139   CREATININE 0.88 09/15/2015 0959      Component Value Date/Time   CALCIUM  8.9 08/05/2023 1139   ALKPHOS 74 08/05/2023 1139   AST 44 (H) 08/05/2023 1139   ALT 46 (H) 08/05/2023 1139   BILITOT 0.9 08/05/2023 1139  There were no vitals filed for this visit. There were no vitals filed for this visit. Other relevant data reviewed during this visit included CBC and CMP

## 2023-09-02 NOTE — Assessment & Plan Note (Addendum)
 Cause unknown, could be related to his medication Observe only

## 2023-09-02 NOTE — Assessment & Plan Note (Addendum)
 He was diagnosed with stage III non-small cell lung cancer in May 2024 Pathology: Non-small cell lung cancer, limited material for subtype, Foundation One test showed no actionable mutations. 6TMB, MSI low  It was unresectable at the time of diagnosis He received concurrent chemotherapy with combination of carboplatin  and paclitaxel  along with radiation treatment, followed by maintenance durvalumab  since August 2024  He tolerated durvalumab  well, without major side effects PET/CT imaging in June 2025 showed no definitive evidence of cancer recurrence We discussed risk and benefits of continuing on maintenance durvalumab  versus stopping and the patient would like to continue treatment every 4 weeks I plan to repeat imaging study again at the end of the year around November

## 2023-09-03 LAB — T4: T4, Total: 12.8 ug/dL — ABNORMAL HIGH (ref 4.5–12.0)

## 2023-09-08 NOTE — Progress Notes (Unsigned)
 HPI: FU coronary disease, ischemic cardiomyopathy, PAF, prior ICD, hypertension and hyperlipidemia. Cardiac history dates back to June of 2001. At that time the patient was found to have an abnormal electrocardiogram. Catheterization revealed a 95% mid LAD and a 70% diffuse right coronary artery. His ejection fraction was 25-30%. He had PCI of his LAD at that time. Patient also with paroxysmal atrial fibrillation noted on previous ICD interrogation.  Presented with ventricular tachycardia September 2022.  Catheterization September 2022 showed 70% mid circumflex, 75% left main, 70% right coronary artery. Had VT ablation September 2022.   Also with history of abdominal aortic aneurysm repair. Again presented with recurrent ventricular tachycardia April 2024.  Amiodarone  was resumed (amiodarone  previously had been discontinued due to elevated ESR).  Repeat echocardiogram September 2024 showed ejection fraction 20 to 25%, severe left ventricular enlargement.  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  Since last seen, patient has mild dyspnea on exertion but no orthopnea, PND, pedal edema, chest pain or syncope.  No bleeding.  Current Outpatient Medications  Medication Sig Dispense Refill   acetaminophen  (TYLENOL ) 500 MG tablet Take 500-1,000 mg by mouth every 8 (eight) hours as needed (for pain).      amiodarone  (PACERONE ) 200 MG tablet Take 1 tablet (200 mg total) by mouth daily. 90 tablet 3   atorvastatin  (LIPITOR ) 80 MG tablet TAKE 1 TABLET BY MOUTH  DAILY AT 6 PM 90 tablet 3   Chlorpheniramine Maleate (ALLERGY RELIEF PO) Take 1 tablet by mouth daily as needed (allergies).     empagliflozin  (JARDIANCE ) 10 MG TABS tablet Take 1 tablet (10 mg total) by mouth daily before breakfast. 90 tablet 2   furosemide   (LASIX ) 40 MG tablet TAKE 1/2 TABLET BY MOUTH DAILY 45 tablet 11   gabapentin  (NEURONTIN ) 100 MG capsule Take 1 capsule (100 mg total) by mouth at bedtime. 30 capsule 0   lidocaine -prilocaine  (EMLA ) cream APPLY TO AFFECTED AREA(S) ONCE AS DIRECTED 30 g 11   losartan  (COZAAR ) 25 MG tablet Take 0.5 tablets (12.5 mg total) by mouth daily. 45 tablet 3   melatonin 5 MG TABS Take 1 tablet (5 mg total) by mouth at bedtime. 30 tablet 0   metoprolol  succinate (TOPROL -XL) 25 MG 24 hr tablet TAKE 1/2 TABLET BY MOUTH 2 TIMES DAILY 90 tablet 3   mometasone -formoterol  (DULERA ) 100-5 MCG/ACT AERO Inhale 2 puffs into the lungs in the morning and at bedtime. 8.8 each 1   Multiple Vitamin (MULTIVITAMIN) capsule Take 1 capsule by mouth daily.     ondansetron  (ZOFRAN ) 8 MG tablet Take 1 tablet (8 mg total) by mouth every 8 (eight) hours as needed for nausea. 30 tablet 3   oxyCODONE  (OXY IR/ROXICODONE ) 5 MG immediate release tablet Take 1 tablet (5 mg total) by mouth every 6 (six) hours as needed for severe pain (pain score 7-10). 60 tablet 0   pantoprazole  (PROTONIX ) 40 MG tablet Take 40 mg by mouth daily.     Rivaroxaban  (XARELTO ) 15 MG TABS tablet Take 1 tablet (15 mg total) by mouth daily with supper. 30 tablet 5   senna-docusate (SENOKOT-S) 8.6-50 MG tablet Take 2 tablets by mouth daily. 60 tablet 1   spironolactone  (ALDACTONE ) 25 MG tablet TAKE 1/2 TABLET BY MOUTH DAILY 45 tablet 11   No current facility-administered medications for this  visit.     Past Medical History:  Diagnosis Date   AAA (abdominal aortic aneurysm) ([name])    Adrenal mass ([name])    per pt this is remote (10 years) and benign by biopsy   AICD (automatic cardioverter/defibrillator) present    Anxiety    Arthritis    CAD (coronary artery disease)    a. s/p PCI to LAD 2001 b. myoview  2014 high risk with scar LAD/RCA territory but no ischemia   Cancer ([name])    Cardiomyopathy, ischemic    Chronic HFrEF (heart failure with reduced ejection  fraction) ([name])    COPD (chronic obstructive pulmonary disease) ([name])    smokes cigars but has quit cigarettes   Defibrillator discharge 04/21/2019   Diverticulitis    Dyspnea    Flu 03/2015   HTN (hypertension)    Hyperlipidemia    Paroxysmal atrial fibrillation ([name]) 03/2015   chads2vasc score is 4   PSA (psoriatic arthritis) ([name])    increased   PVD (peripheral vascular disease) ([name])    VT (ventricular tachycardia) ([name])     Past Surgical History:  Procedure Laterality Date   ABDOMINAL AORTIC ENDOVASCULAR STENT GRAFT N/A 03/03/2017   Procedure: ABDOMINAL AORTIC ENDOVASCULAR STENT GRAFT;  Surgeon: Sheree Penne Bruckner, MD;  Location: [facility] OR;  Service: Vascular;  Laterality: N/A;   BRONCHIAL NEEDLE ASPIRATION BIOPSY  06/20/2022   Procedure: BRONCHIAL NEEDLE ASPIRATION BIOPSIES;  Surgeon: Gladis Leonor HERO, MD;  Location: [facility] ENDOSCOPY;  Service: Pulmonary;;   cataract surgery     CORONARY ANGIOGRAPHY N/A 10/12/2020   Procedure: CORONARY ANGIOGRAPHY;  Surgeon: Court Dorn PARAS, MD;  Location: [facility] INVASIVE CV LAB;  Service: Cardiovascular;  Laterality: N/A;   CORONARY ANGIOPLASTY WITH STENT PLACEMENT  2001   a. PCI to LAD   IMPLANTABLE CARDIOVERTER DEFIBRILLATOR (ICD) GENERATOR CHANGE N/A 03/18/2014   a. SJM Fortify ST DR ICD implanted by [facility] for primary prevention b. gen change 03/2014    IR IMAGING GUIDED PORT INSERTION  07/09/2022   LEAD REVISION/REPAIR N/A 07/17/2016   Procedure: Lead Revision/Repair;  Surgeon: Waddell Danelle ORN, MD;  Location: [facility] INVASIVE CV LAB;  Service: Cardiovascular;  Laterality: N/A;   LEFT HEART CATHETERIZATION WITH CORONARY ANGIOGRAM N/A 05/27/2014   Procedure: LEFT HEART CATHETERIZATION WITH CORONARY ANGIOGRAM;  Surgeon: Ozell Fell, MD;  Location: [facility] CATH LAB;  Service: Cardiovascular;  Laterality: N/A;   REPAIR KNEE LIGAMENT     V TACH ABLATION N/A 10/26/2020   Procedure: V TACH ABLATION;  Surgeon: Waddell Danelle ORN, MD;  Location: [facility] INVASIVE CV LAB;   Service: Cardiovascular;  Laterality: N/A;   VIDEO BRONCHOSCOPY WITH ENDOBRONCHIAL ULTRASOUND N/A 06/20/2022   Procedure: VIDEO BRONCHOSCOPY WITH ENDOBRONCHIAL ULTRASOUND;  Surgeon: Gladis Leonor HERO, MD;  Location: [facility] ENDOSCOPY;  Service: Pulmonary;  Laterality: N/A;    Social History   Socioeconomic History   Marital status: Married    Spouse name: Althene   Number of children: 3   Years of education: Not on file   Highest education level: 10th grade  Occupational History   Occupation: Therapist, music care   Occupation: Retired  Tobacco Use   Smoking status: Some Days    Types: Cigars    Last attempt to quit: 2020    Years since quitting: 5.6   Smokeless tobacco: Never   Tobacco comments:    smokes cigars now, no longer smokes cigarettes but previously smoked 2ppd  Vaping Use   Vaping status: Never Used  Substance and Sexual Activity  Alcohol use: No   Drug use: No   Sexual activity: Not Currently    Partners: Female    Birth control/protection: None  Other Topics Concern   Not on file  Social History Narrative   Not on file   Social Drivers of Health   Financial Resource Strain: Low Risk  (10/30/2022)   Overall Financial Resource Strain (CARDIA)    Difficulty of Paying Living Expenses: Not very hard  Food Insecurity: No Food Insecurity (10/29/2022)   Hunger Vital Sign    Worried About Running Out of Food in the Last Year: Never true    Ran Out of Food in the Last Year: Never true  Transportation Needs: No Transportation Needs (10/30/2022)   PRAPARE - Administrator, Civil Service (Medical): No    Lack of Transportation (Non-Medical): No  Physical Activity: Not on file  Stress: Not on file  Social Connections: Unknown (06/19/2021)   Received from [facility]   Social Network    Social Network: Not on file  Intimate Partner Violence: Patient Unable To Answer (10/29/2022)   Humiliation, Afraid, Rape, and Kick questionnaire    Fear of Current or Ex-Partner:  Patient unable to answer    Emotionally Abused: Patient unable to answer    Physically Abused: Patient unable to answer    Sexually Abused: Patient unable to answer    Family History  Problem Relation Age of Onset   Cirrhosis Mother        due to ETOH   Cancer Neg Hx     ROS: no fevers or chills, productive cough, hemoptysis, dysphasia, odynophagia, melena, hematochezia, dysuria, hematuria, rash, seizure activity, orthopnea, PND, pedal edema, claudication. Remaining systems are negative.  Physical Exam: Well-developed well-nourished in no acute distress.  Skin is warm and dry.  HEENT is normal.  Neck is supple.  Chest is clear to auscultation with normal expansion.  Cardiovascular exam is regular rate and rhythm.  Abdominal exam nontender or distended. No masses palpated. Extremities show no edema. neuro grossly intact  EKG Interpretation Date/Time:  Wednesday September 10 2023 10:46:26 EDT Ventricular Rate:  70 PR Interval:  226 QRS Duration:  182 QT Interval:  504 QTC Calculation: 544 R Axis:   -53  Text Interpretation: Sinus rhythm with 1st degree A-V block Left axis deviation Right bundle branch block Inferior infarct T wave abnormality, consider lateral ischemia Confirmed by Pietro Rogue ([id number]) on 09/10/2023 10:50:12 AM    A/P  1 history of ventricular tachycardia-continue amiodarone  at 200 mg daily.  2 ischemic cardiomyopathy-will continue losartan , Toprol , spironolactone  and Jardiance  at present dose.  Blood pressure did not allow Entresto .  3 paroxysmal atrial fibrillation-will continue Toprol  and Xarelto  at present dose.  4 coronary artery disease-he denies chest pain.  Continue statin.  5 ICD-followed by electrophysiology.  6 status post abdominal aortic aneurysm repair-patient is followed by vascular surgery.  7 hypertension-blood pressure controlled.  Continue present medications.  8 hyperlipidemia-continue statin.  9 lung cancer-followed by  oncology.  Rogue Pietro, MD

## 2023-09-10 ENCOUNTER — Encounter: Payer: Self-pay | Admitting: Cardiology

## 2023-09-10 ENCOUNTER — Ambulatory Visit: Attending: Cardiology | Admitting: Cardiology

## 2023-09-10 VITALS — BP 118/66 | HR 70 | Ht 70.0 in | Wt 135.0 lb

## 2023-09-10 DIAGNOSIS — Z95810 Presence of automatic (implantable) cardiac defibrillator: Secondary | ICD-10-CM | POA: Diagnosis not present

## 2023-09-10 DIAGNOSIS — I255 Ischemic cardiomyopathy: Secondary | ICD-10-CM | POA: Diagnosis not present

## 2023-09-10 DIAGNOSIS — E785 Hyperlipidemia, unspecified: Secondary | ICD-10-CM

## 2023-09-10 DIAGNOSIS — I5022 Chronic systolic (congestive) heart failure: Secondary | ICD-10-CM

## 2023-09-10 DIAGNOSIS — I480 Paroxysmal atrial fibrillation: Secondary | ICD-10-CM | POA: Diagnosis not present

## 2023-09-10 DIAGNOSIS — I10 Essential (primary) hypertension: Secondary | ICD-10-CM

## 2023-09-10 NOTE — Patient Instructions (Signed)

## 2023-09-22 ENCOUNTER — Encounter

## 2023-09-25 ENCOUNTER — Ambulatory Visit

## 2023-09-26 LAB — CUP PACEART REMOTE DEVICE CHECK
Battery Remaining Longevity: 4 mo
Battery Remaining Percentage: 4 %
Battery Voltage: 2.62 V
Brady Statistic AP VP Percent: 11 %
Brady Statistic AP VS Percent: 5.9 %
Brady Statistic AS VP Percent: 2.1 %
Brady Statistic AS VS Percent: 81 %
Brady Statistic RA Percent Paced: 16 %
Brady Statistic RV Percent Paced: 13 %
Date Time Interrogation Session: 20250821040025
HighPow Impedance: 70 Ohm
HighPow Impedance: 70 Ohm
Implantable Lead Connection Status: 753985
Implantable Lead Connection Status: 753985
Implantable Lead Implant Date: 20120420
Implantable Lead Implant Date: 20180613
Implantable Lead Location: 753859
Implantable Lead Location: 753860
Implantable Pulse Generator Implant Date: 20160212
Lead Channel Impedance Value: 350 Ohm
Lead Channel Impedance Value: 550 Ohm
Lead Channel Pacing Threshold Amplitude: 0.5 V
Lead Channel Pacing Threshold Amplitude: 1 V
Lead Channel Pacing Threshold Pulse Width: 0.5 ms
Lead Channel Pacing Threshold Pulse Width: 0.5 ms
Lead Channel Sensing Intrinsic Amplitude: 11.6 mV
Lead Channel Sensing Intrinsic Amplitude: 2 mV
Lead Channel Setting Pacing Amplitude: 2 V
Lead Channel Setting Pacing Amplitude: 2.5 V
Lead Channel Setting Pacing Pulse Width: 0.5 ms
Lead Channel Setting Sensing Sensitivity: 0.5 mV
Pulse Gen Serial Number: 7225079

## 2023-09-28 ENCOUNTER — Ambulatory Visit: Payer: Self-pay | Admitting: Internal Medicine

## 2023-09-29 NOTE — Assessment & Plan Note (Signed)
 He was diagnosed with stage III non-small cell lung cancer in May 2024 Pathology: Non-small cell lung cancer, limited material for subtype, Foundation One test showed no actionable mutations. 6TMB, MSI low  It was unresectable at the time of diagnosis He received concurrent chemotherapy with combination of carboplatin  and paclitaxel  along with radiation treatment, followed by maintenance durvalumab  since August 2024  PET/CT imaging in June 2025 showed no definitive evidence of cancer recurrence He tolerated durvalumab  well, without major side effects We will proceed without delay I plan to repeat imaging study again at the end of the year around November

## 2023-09-30 ENCOUNTER — Inpatient Hospital Stay: Attending: Hematology and Oncology

## 2023-09-30 ENCOUNTER — Inpatient Hospital Stay: Admitting: Hematology and Oncology

## 2023-09-30 ENCOUNTER — Encounter: Payer: Self-pay | Admitting: Hematology and Oncology

## 2023-09-30 ENCOUNTER — Inpatient Hospital Stay

## 2023-09-30 VITALS — BP 119/61 | HR 69 | Temp 97.4°F | Resp 18 | Ht 70.0 in | Wt 131.6 lb

## 2023-09-30 DIAGNOSIS — Z79899 Other long term (current) drug therapy: Secondary | ICD-10-CM | POA: Insufficient documentation

## 2023-09-30 DIAGNOSIS — G8929 Other chronic pain: Secondary | ICD-10-CM | POA: Diagnosis not present

## 2023-09-30 DIAGNOSIS — M545 Low back pain, unspecified: Secondary | ICD-10-CM

## 2023-09-30 DIAGNOSIS — Z5112 Encounter for antineoplastic immunotherapy: Secondary | ICD-10-CM | POA: Insufficient documentation

## 2023-09-30 DIAGNOSIS — I5042 Chronic combined systolic (congestive) and diastolic (congestive) heart failure: Secondary | ICD-10-CM | POA: Diagnosis not present

## 2023-09-30 DIAGNOSIS — C3491 Malignant neoplasm of unspecified part of right bronchus or lung: Secondary | ICD-10-CM

## 2023-09-30 LAB — CBC WITH DIFFERENTIAL (CANCER CENTER ONLY)
Abs Immature Granulocytes: 0.03 K/uL (ref 0.00–0.07)
Basophils Absolute: 0.1 K/uL (ref 0.0–0.1)
Basophils Relative: 1 %
Eosinophils Absolute: 0.2 K/uL (ref 0.0–0.5)
Eosinophils Relative: 3 %
HCT: 41.8 % (ref 39.0–52.0)
Hemoglobin: 14.1 g/dL (ref 13.0–17.0)
Immature Granulocytes: 0 %
Lymphocytes Relative: 12 %
Lymphs Abs: 0.8 K/uL (ref 0.7–4.0)
MCH: 31.2 pg (ref 26.0–34.0)
MCHC: 33.7 g/dL (ref 30.0–36.0)
MCV: 92.5 fL (ref 80.0–100.0)
Monocytes Absolute: 0.8 K/uL (ref 0.1–1.0)
Monocytes Relative: 11 %
Neutro Abs: 5.1 K/uL (ref 1.7–7.7)
Neutrophils Relative %: 73 %
Platelet Count: 162 K/uL (ref 150–400)
RBC: 4.52 MIL/uL (ref 4.22–5.81)
RDW: 15.7 % — ABNORMAL HIGH (ref 11.5–15.5)
WBC Count: 7 K/uL (ref 4.0–10.5)
nRBC: 0 % (ref 0.0–0.2)

## 2023-09-30 LAB — CMP (CANCER CENTER ONLY)
ALT: 29 U/L (ref 0–44)
AST: 35 U/L (ref 15–41)
Albumin: 3.6 g/dL (ref 3.5–5.0)
Alkaline Phosphatase: 72 U/L (ref 38–126)
Anion gap: 4 — ABNORMAL LOW (ref 5–15)
BUN: 13 mg/dL (ref 8–23)
CO2: 29 mmol/L (ref 22–32)
Calcium: 8.8 mg/dL — ABNORMAL LOW (ref 8.9–10.3)
Chloride: 107 mmol/L (ref 98–111)
Creatinine: 0.91 mg/dL (ref 0.61–1.24)
GFR, Estimated: >60 mL/min (ref >60)
Glucose, Bld: 123 mg/dL — ABNORMAL HIGH (ref 70–99)
Potassium: 3.8 mmol/L (ref 3.5–5.1)
Sodium: 140 mmol/L (ref 135–145)
Total Bilirubin: 0.8 mg/dL (ref 0.0–1.2)
Total Protein: 6.8 g/dL (ref 6.5–8.1)

## 2023-09-30 LAB — TSH: TSH: 1.63 u[IU]/mL (ref 0.350–4.500)

## 2023-09-30 MED ORDER — SODIUM CHLORIDE 0.9 % IV SOLN
1500.0000 mg | Freq: Once | INTRAVENOUS | Status: AC
  Administered 2023-09-30: 1500 mg via INTRAVENOUS
  Filled 2023-09-30: qty 30

## 2023-09-30 MED ORDER — SODIUM CHLORIDE 0.9% FLUSH
10.0000 mL | Freq: Once | INTRAVENOUS | Status: AC
  Administered 2023-09-30: 10 mL

## 2023-09-30 MED ORDER — SODIUM CHLORIDE 0.9 % IV SOLN: Freq: Once | INTRAVENOUS | Status: AC

## 2023-09-30 NOTE — Progress Notes (Signed)
 [facility] OFFICE PROGRESS NOTE  Patient Care Team: Maree Leni Edyth DELENA, MD as PCP - General (Family Medicine) Pietro Redell RAMAN, MD as PCP - Cardiology (Cardiology) Mealor, Eulas BRAVO, [name]  Assessment & Plan Non-small cell cancer of right lung [facility]) He was diagnosed with stage III non-small cell lung cancer in May 2024 Pathology: Non-small cell lung cancer, limited material for subtype, Foundation One test showed no actionable mutations. 6TMB, MSI low  It was unresectable at the time of diagnosis He received concurrent chemotherapy with combination of carboplatin  and paclitaxel  along with radiation treatment, followed by maintenance durvalumab  since August 2024  PET/CT imaging in June 2025 showed no definitive evidence of cancer recurrence He tolerated durvalumab  well, without major side effects We will proceed without delay I plan to repeat imaging study again at the end of the year around November Chronic midline low back pain without sciatica He has chronic lower back pain for a long time He has been taking intermittent doses of oxycodone  and Tylenol  as needed I recommend he continues the same He has no major side effects from these medications He will continue the oxycodone  I prescribed Chronic combined systolic and diastolic heart failure ([name]) His BP is good and he has no signs or symptoms of CHF Continue risk factor management  Orders Placed This Encounter  Procedures   CBC with Differential (Cancer Center Only)    Standing Status:   Future    Expected Date:   11/25/2023    Expiration Date:   11/24/2024   CMP (Cancer Center only)    Standing Status:   Future    Expected Date:   11/25/2023    Expiration Date:   11/24/2024   T4    Standing Status:   Future    Expected Date:   11/25/2023    Expiration Date:   11/24/2024   TSH    Standing Status:   Future    Expected Date:   11/25/2023    Expiration Date:    11/24/2024     [name], MD  INTERVAL HISTORY: he returns for treatment follow-up Complications related to previous cycle of chemotherapy included bone aches, Denies recent infection No recent cough, chest pain or shortness of breath Denies leg swelling  PHYSICAL EXAMINATION: ECOG PERFORMANCE STATUS: 1 - Symptomatic but completely ambulatory  No results found for: CAN125    Latest Ref Rng & Units 09/30/2023   11:39 AM 09/02/2023   10:46 AM 08/05/2023   11:39 AM  CBC  WBC 4.0 - 10.5 K/uL 7.0  6.0  6.9   Hemoglobin 13.0 - 17.0 g/dL 85.8  86.0  86.1   Hematocrit 39.0 - 52.0 % 41.8  41.1  40.0   Platelets 150 - 400 K/uL 162  158  182       Chemistry      Component Value Date/Time   NA 140 09/02/2023 1046   NA 138 04/15/2022 0913   K 3.8 09/02/2023 1046   CL 108 09/02/2023 1046   CO2 28 09/02/2023 1046   BUN 12 09/02/2023 1046   BUN 7 (L) 04/15/2022 0913   CREATININE 1.03 09/02/2023 1046   CREATININE 0.88 09/15/2015 0959      Component Value Date/Time   CALCIUM  8.7 (L) 09/02/2023 1046   ALKPHOS 73 09/02/2023 1046   AST 37 09/02/2023 1046   ALT 29 09/02/2023 1046   BILITOT 0.7 09/02/2023 1046       Vitals:  09/30/23 1206  BP: 119/61  Pulse: 69  Resp: 18  Temp: (!) 97.4 F (36.3 C)  SpO2: 99%   Filed Weights   09/30/23 1206  Weight: 131 lb 9.6 oz (59.7 kg)   Other relevant data reviewed during this visit included CBC, CMP, TSH

## 2023-09-30 NOTE — Progress Notes (Signed)
 Remote ICD transmission.

## 2023-09-30 NOTE — Assessment & Plan Note (Addendum)
 His BP is good and he has no signs or symptoms of CHF Continue risk factor management

## 2023-09-30 NOTE — Patient Instructions (Signed)

## 2023-09-30 NOTE — Assessment & Plan Note (Addendum)
 He has chronic lower back pain for a long time He has been taking intermittent doses of oxycodone  and Tylenol  as needed I recommend he continues the same He has no major side effects from these medications He will continue the oxycodone  I prescribed

## 2023-10-01 LAB — T4: T4, Total: 12.8 ug/dL — ABNORMAL HIGH (ref 4.5–12.0)

## 2023-10-03 ENCOUNTER — Other Ambulatory Visit (HOSPITAL_COMMUNITY): Payer: Self-pay | Admitting: Physician Assistant

## 2023-10-07 ENCOUNTER — Ambulatory Visit: Admitting: Cardiology

## 2023-10-23 ENCOUNTER — Encounter

## 2023-10-27 ENCOUNTER — Encounter

## 2023-10-28 ENCOUNTER — Encounter: Payer: Self-pay | Admitting: Hematology and Oncology

## 2023-10-28 ENCOUNTER — Inpatient Hospital Stay: Attending: Hematology and Oncology

## 2023-10-28 ENCOUNTER — Inpatient Hospital Stay: Admitting: Hematology and Oncology

## 2023-10-28 ENCOUNTER — Inpatient Hospital Stay

## 2023-10-28 VITALS — BP 97/69 | HR 74 | Temp 97.9°F | Resp 18 | Ht 70.0 in | Wt 133.4 lb

## 2023-10-28 DIAGNOSIS — Z79899 Other long term (current) drug therapy: Secondary | ICD-10-CM | POA: Diagnosis not present

## 2023-10-28 DIAGNOSIS — C3491 Malignant neoplasm of unspecified part of right bronchus or lung: Secondary | ICD-10-CM

## 2023-10-28 DIAGNOSIS — Z5112 Encounter for antineoplastic immunotherapy: Secondary | ICD-10-CM | POA: Diagnosis present

## 2023-10-28 DIAGNOSIS — I5042 Chronic combined systolic (congestive) and diastolic (congestive) heart failure: Secondary | ICD-10-CM

## 2023-10-28 LAB — CMP (CANCER CENTER ONLY)
ALT: 26 U/L (ref 0–44)
AST: 28 U/L (ref 15–41)
Albumin: 3.7 g/dL (ref 3.5–5.0)
Alkaline Phosphatase: 71 U/L (ref 38–126)
Anion gap: 5 (ref 5–15)
BUN: 13 mg/dL (ref 8–23)
CO2: 27 mmol/L (ref 22–32)
Calcium: 8.7 mg/dL — ABNORMAL LOW (ref 8.9–10.3)
Chloride: 107 mmol/L (ref 98–111)
Creatinine: 0.93 mg/dL (ref 0.61–1.24)
GFR, Estimated: >60 mL/min (ref >60)
Glucose, Bld: 150 mg/dL — ABNORMAL HIGH (ref 70–99)
Potassium: 3.6 mmol/L (ref 3.5–5.1)
Sodium: 139 mmol/L (ref 135–145)
Total Bilirubin: 0.8 mg/dL (ref 0.0–1.2)
Total Protein: 7 g/dL (ref 6.5–8.1)

## 2023-10-28 LAB — CBC WITH DIFFERENTIAL (CANCER CENTER ONLY)
Abs Immature Granulocytes: 0.03 K/uL (ref 0.00–0.07)
Basophils Absolute: 0 K/uL (ref 0.0–0.1)
Basophils Relative: 1 %
Eosinophils Absolute: 0.1 K/uL (ref 0.0–0.5)
Eosinophils Relative: 2 %
HCT: 41.3 % (ref 39.0–52.0)
Hemoglobin: 14.2 g/dL (ref 13.0–17.0)
Immature Granulocytes: 0 %
Lymphocytes Relative: 10 %
Lymphs Abs: 0.7 K/uL (ref 0.7–4.0)
MCH: 31.3 pg (ref 26.0–34.0)
MCHC: 34.4 g/dL (ref 30.0–36.0)
MCV: 91.2 fL (ref 80.0–100.0)
Monocytes Absolute: 0.7 K/uL (ref 0.1–1.0)
Monocytes Relative: 10 %
Neutro Abs: 5.2 K/uL (ref 1.7–7.7)
Neutrophils Relative %: 77 %
Platelet Count: 161 K/uL (ref 150–400)
RBC: 4.53 MIL/uL (ref 4.22–5.81)
RDW: 15.8 % — ABNORMAL HIGH (ref 11.5–15.5)
WBC Count: 6.8 K/uL (ref 4.0–10.5)
nRBC: 0 % (ref 0.0–0.2)

## 2023-10-28 LAB — TSH: TSH: 2.06 u[IU]/mL (ref 0.350–4.500)

## 2023-10-28 MED ORDER — SODIUM CHLORIDE 0.9 % IV SOLN: Freq: Once | INTRAVENOUS | Status: AC

## 2023-10-28 MED ORDER — SODIUM CHLORIDE 0.9 % IV SOLN
1500.0000 mg | Freq: Once | INTRAVENOUS | Status: AC
  Administered 2023-10-28: 1500 mg via INTRAVENOUS
  Filled 2023-10-28: qty 30

## 2023-10-28 NOTE — Patient Instructions (Signed)
 [facility] - A DEPT OF [facility]   Discharge Instructions: Thank you for choosing [facility] to provide your oncology and hematology care.   If you have a lab appointment with the Cancer Center, please go directly to the Cancer Center and check in at the registration area.   Wear comfortable clothing and clothing appropriate for easy access to any Portacath or PICC line.   We strive to give you quality time with your provider. You may need to reschedule your appointment if you arrive late (15 or more minutes).  Arriving late affects you and other patients whose appointments are after yours.  Also, if you miss three or more appointments without notifying the office, you may be dismissed from the clinic at the provider's discretion.      For prescription refill requests, have your pharmacy contact our office and allow 72 hours for refills to be completed.    Today you received the following chemotherapy and/or immunotherapy agents: Durvalumab (Imfinzi)      To help prevent nausea and vomiting after your treatment, we encourage you to take your nausea medication as directed.  BELOW ARE SYMPTOMS THAT SHOULD BE REPORTED IMMEDIATELY: *FEVER GREATER THAN 100.4 F (38 C) OR HIGHER *CHILLS OR SWEATING *NAUSEA AND VOMITING THAT IS NOT CONTROLLED WITH YOUR NAUSEA MEDICATION *UNUSUAL SHORTNESS OF BREATH *UNUSUAL BRUISING OR BLEEDING *URINARY PROBLEMS (pain or burning when urinating, or frequent urination) *BOWEL PROBLEMS (unusual diarrhea, constipation, pain near the anus) TENDERNESS IN MOUTH AND THROAT WITH OR WITHOUT PRESENCE OF ULCERS (sore throat, sores in mouth, or a toothache) UNUSUAL RASH, SWELLING OR PAIN  UNUSUAL VAGINAL DISCHARGE OR ITCHING   Items with * indicate a potential emergency and should be followed up as soon as possible or go to the Emergency Department if any problems should occur.  Please show the CHEMOTHERAPY ALERT CARD or  IMMUNOTHERAPY ALERT CARD at check-in to the Emergency Department and triage nurse.  Should you have questions after your visit or need to cancel or reschedule your appointment, please contact [facility] - A DEPT OF [facility]  Dept: [phone]  and follow the prompts.  Office hours are 8:00 a.m. to 4:30 p.m. Monday - Friday. Please note that voicemails left after 4:00 p.m. may not be returned until the following business day.  We are closed weekends and major holidays. You have access to a nurse at all times for urgent questions. Please call the main number to the clinic Dept: [phone] and follow the prompts.   For any non-urgent questions, you may also contact your provider using MyChart. We now offer e-Visits for anyone 24 and older to request care online for non-urgent symptoms. For details visit [email or web address].   Also download the MyChart app! Go to the app store, search "MyChart", open the app, select [facility], and log in with your MyChart username and password.

## 2023-10-28 NOTE — Assessment & Plan Note (Addendum)
 His BP is good and he has no signs or symptoms of CHF Continue risk factor management

## 2023-10-28 NOTE — Assessment & Plan Note (Addendum)
 He was diagnosed with stage III non-small cell lung cancer in May 2024 Pathology: Non-small cell lung cancer, limited material for subtype, Foundation One test showed no actionable mutations. 6TMB, MSI low  It was unresectable at the time of diagnosis He received concurrent chemotherapy with combination of carboplatin  and paclitaxel  along with radiation treatment, followed by maintenance durvalumab  since August 2024  PET/CT imaging in June 2025 showed no definitive evidence of cancer recurrence He tolerated durvalumab  well, without major side effects We will proceed without delay I plan to repeat imaging study again at the end of the year around November

## 2023-10-28 NOTE — Progress Notes (Signed)
 [facility] OFFICE PROGRESS NOTE  Patient Care Team: Maree Leni Edyth DELENA, MD as PCP - General (Family Medicine) Pietro Redell RAMAN, MD as PCP - Cardiology (Cardiology) Mealor, Eulas BRAVO, [name]  Assessment & Plan Non-small cell cancer of right lung [facility]) He was diagnosed with stage III non-small cell lung cancer in May 2024 Pathology: Non-small cell lung cancer, limited material for subtype, Foundation One test showed no actionable mutations. 6TMB, MSI low  It was unresectable at the time of diagnosis He received concurrent chemotherapy with combination of carboplatin  and paclitaxel  along with radiation treatment, followed by maintenance durvalumab  since August 2024  PET/CT imaging in June 2025 showed no definitive evidence of cancer recurrence He tolerated durvalumab  well, without major side effects We will proceed without delay I plan to repeat imaging study again at the end of the year around November Chronic combined systolic and diastolic heart failure ([name]) His BP is good and he has no signs or symptoms of CHF Continue risk factor management  No orders of the defined types were placed in this encounter.    [name], MD  INTERVAL HISTORY: he returns for treatment follow-up Complications related to previous cycle of chemotherapy included none  PHYSICAL EXAMINATION: ECOG PERFORMANCE STATUS: 0 - Asymptomatic  No results found for: CAN125    Latest Ref Rng & Units 10/28/2023   11:42 AM 09/30/2023   11:39 AM 09/02/2023   10:46 AM  CBC  WBC 4.0 - 10.5 K/uL 6.8  7.0  6.0   Hemoglobin 13.0 - 17.0 g/dL 85.7  85.8  86.0   Hematocrit 39.0 - 52.0 % 41.3  41.8  41.1   Platelets 150 - 400 K/uL 161  162  158       Chemistry      Component Value Date/Time   NA 140 09/30/2023 1139   NA 138 04/15/2022 0913   K 3.8 09/30/2023 1139   CL 107 09/30/2023 1139   CO2 29 09/30/2023 1139   BUN 13 09/30/2023 1139   BUN 7 (L) 04/15/2022  0913   CREATININE 0.91 09/30/2023 1139   CREATININE 0.88 09/15/2015 0959      Component Value Date/Time   CALCIUM  8.8 (L) 09/30/2023 1139   ALKPHOS 72 09/30/2023 1139   AST 35 09/30/2023 1139   ALT 29 09/30/2023 1139   BILITOT 0.8 09/30/2023 1139       Vitals:   10/28/23 1205  BP: 97/69  Pulse: 74  Resp: 18  Temp: 97.9 F (36.6 C)  SpO2: 98%   Filed Weights   10/28/23 1205  Weight: 133 lb 6.4 oz (60.5 kg)   Other relevant data reviewed during this visit included CBC and CMP

## 2023-10-29 ENCOUNTER — Other Ambulatory Visit: Payer: Self-pay

## 2023-10-29 LAB — T4: T4, Total: 10.6 ug/dL (ref 4.5–12.0)

## 2023-11-05 ENCOUNTER — Ambulatory Visit

## 2023-11-05 DIAGNOSIS — I480 Paroxysmal atrial fibrillation: Secondary | ICD-10-CM

## 2023-11-06 LAB — CUP PACEART REMOTE DEVICE CHECK
Battery Remaining Longevity: 3 mo
Battery Remaining Percentage: 3 %
Battery Voltage: 2.62 V
Brady Statistic AP VP Percent: 11 %
Brady Statistic AP VS Percent: 6.4 %
Brady Statistic AS VP Percent: 2.1 %
Brady Statistic AS VS Percent: 81 %
Brady Statistic RA Percent Paced: 17 %
Brady Statistic RV Percent Paced: 13 %
Date Time Interrogation Session: 20251001153331
HighPow Impedance: 70 Ohm
HighPow Impedance: 70 Ohm
Implantable Lead Connection Status: 753985
Implantable Lead Connection Status: 753985
Implantable Lead Implant Date: 20120420
Implantable Lead Implant Date: 20180613
Implantable Lead Location: 753859
Implantable Lead Location: 753860
Implantable Pulse Generator Implant Date: 20160212
Lead Channel Impedance Value: 340 Ohm
Lead Channel Impedance Value: 550 Ohm
Lead Channel Pacing Threshold Amplitude: 0.5 V
Lead Channel Pacing Threshold Amplitude: 1 V
Lead Channel Pacing Threshold Pulse Width: 0.5 ms
Lead Channel Pacing Threshold Pulse Width: 0.5 ms
Lead Channel Sensing Intrinsic Amplitude: 1.4 mV
Lead Channel Sensing Intrinsic Amplitude: 11.6 mV
Lead Channel Setting Pacing Amplitude: 2 V
Lead Channel Setting Pacing Amplitude: 2.5 V
Lead Channel Setting Pacing Pulse Width: 0.5 ms
Lead Channel Setting Sensing Sensitivity: 0.5 mV
Pulse Gen Serial Number: 7225079

## 2023-11-09 ENCOUNTER — Ambulatory Visit: Payer: Self-pay | Admitting: Internal Medicine

## 2023-11-10 ENCOUNTER — Telehealth: Payer: Self-pay

## 2023-11-10 NOTE — Telephone Encounter (Signed)
 Alert remote transmission:  Device reached ERI 11/08/23   Outreach made to Pt.  Spoke with Pt and wife.  Advised device ERI.  Advised would forward to scheduling and they should expect a call to schedule an appointment to discuss gen change.  All questions answered.

## 2023-11-10 NOTE — Progress Notes (Signed)
 Remote ICD Transmission

## 2023-11-11 NOTE — Telephone Encounter (Signed)
 Spoke w/ patient - he scheduled to see Dr. Nancey on 10/14 for this discussion for gen change.

## 2023-11-13 ENCOUNTER — Telehealth: Payer: Self-pay | Admitting: Cardiology

## 2023-11-13 NOTE — Telephone Encounter (Signed)
*  STAT* If patient is at the pharmacy, call can be transferred to refill team.   1. Which medications need to be refilled? (please list name of each medication and dose if known)  Rivaroxaban  (XARELTO ) 15 MG TABS tablet    2. Which pharmacy/location (including street and city if local pharmacy) is medication to be sent to? [id number] - [place], [place] - [address]    3. Do they need a 30 day or 90 day supply?   30 day supply   Wife says patient is completely out of medication.

## 2023-11-14 ENCOUNTER — Telehealth: Payer: Self-pay | Admitting: Cardiology

## 2023-11-14 MED ORDER — RIVAROXABAN 15 MG PO TABS: 15.0000 mg | ORAL_TABLET | Freq: Every day | ORAL | 5 refills | Status: AC

## 2023-11-14 NOTE — Telephone Encounter (Signed)
Rx sent earlier today.

## 2023-11-14 NOTE — Telephone Encounter (Signed)
*  STAT* If patient is at the pharmacy, call can be transferred to refill team.   1. Which medications need to be refilled? (please list name of each medication and dose if known)   Rivaroxaban  (XARELTO ) 15 MG TABS tablet     2. Would you like to learn more about the convenience, safety, & potential cost savings by using the [facility]? no   3. Are you open to using the [facility] (Type [facility].  ).no    4. Which pharmacy/location (including street and city if local pharmacy) is medication to be sent to? [id number] - [place], [place] - [address]     5. Do they need a 30 day or 90 day supply? 30 day   Medication was sent to wrong pharmacy on 11/14/23 and currently out of medication

## 2023-11-14 NOTE — Telephone Encounter (Deleted)
 Prescription refill request for Xarelto  received.  Indication: a fib Last office visit: 09/10/23 Weight: 133# Age: 78 Scr: 0.93 CrCl: 54 ml/min

## 2023-11-18 ENCOUNTER — Ambulatory Visit: Attending: Cardiovascular Disease | Admitting: Cardiovascular Disease

## 2023-11-18 ENCOUNTER — Encounter: Payer: Self-pay | Admitting: Cardiovascular Disease

## 2023-11-18 VITALS — BP 120/75 | HR 68 | Ht 70.0 in | Wt 130.0 lb

## 2023-11-18 DIAGNOSIS — I5022 Chronic systolic (congestive) heart failure: Secondary | ICD-10-CM | POA: Diagnosis not present

## 2023-11-18 DIAGNOSIS — I480 Paroxysmal atrial fibrillation: Secondary | ICD-10-CM

## 2023-11-18 DIAGNOSIS — I472 Ventricular tachycardia, unspecified: Secondary | ICD-10-CM

## 2023-11-18 DIAGNOSIS — Z01812 Encounter for preprocedural laboratory examination: Secondary | ICD-10-CM

## 2023-11-18 DIAGNOSIS — Z95810 Presence of automatic (implantable) cardiac defibrillator: Secondary | ICD-10-CM

## 2023-11-18 DIAGNOSIS — I255 Ischemic cardiomyopathy: Secondary | ICD-10-CM

## 2023-11-18 LAB — CUP PACEART INCLINIC DEVICE CHECK
Battery Remaining Longevity: 0 mo
Brady Statistic RA Percent Paced: 0.09 %
Brady Statistic RV Percent Paced: 0.09 %
Date Time Interrogation Session: 20251014114550
Implantable Lead Connection Status: 753985
Implantable Lead Connection Status: 753985
Implantable Lead Implant Date: 20120420
Implantable Lead Implant Date: 20180613
Implantable Lead Location: 753859
Implantable Lead Location: 753860
Implantable Pulse Generator Implant Date: 20160212
Lead Channel Sensing Intrinsic Amplitude: 1.6 mV
Lead Channel Sensing Intrinsic Amplitude: 11.6 mV
Lead Channel Setting Pacing Amplitude: 2 V
Lead Channel Setting Pacing Amplitude: 2.5 V
Lead Channel Setting Pacing Pulse Width: 0.5 ms
Lead Channel Setting Sensing Sensitivity: 0.5 mV
Pulse Gen Serial Number: 7225079

## 2023-11-18 NOTE — H&P (View-Only) (Signed)
  Electrophysiology Office Note:    Date:  11/18/2023   ID:  Benjamin Cook, DOB 09/03/1945, MRN [id number]  PCP:  Maree Leni Edyth DELENA, MD   [facility] Providers Cardiologist:  Redell Shallow, MD Electrophysiologist:  Eulas FORBES Furbish, MD     Referring MD: Maree Leni Edyth DELENA, MD   History of Present Illness:    Benjamin Cook is a 78 y.o. male with a hx listed below, significant for VT, CHFrEF, CAD, EVAR for large abdominal aortic aneurysm in 2019 referred for arrhythmia management.  He has had an ablation of an apical left ventricular VT focus in December 2022.  He had been maintained on amiodarone , but this was stopped in March 2024 for positive ESR. He was admitted in May for multiple VT episodes terminated by ATP.  He was also noted to have a large lung mass concerning for neoplasm. He was reloaded with amiodarone  with resolution of his sustained VT and significant improvement in burden of ventricular ectopy and NSVT.     EKGs/Labs/Other Studies Reviewed Today:    Echocardiogram:  TTE 06/03/22 EF 20-25%   Monitors:   Stress testing:   Advanced imaging:   Cardiac catherization   EKG:  Last EKG results: sinus rhythm, RBBB    Recent Labs: 10/28/2023: ALT 26; BUN 13; Creatinine 0.93; Hemoglobin 14.2; Platelet Count 161; Potassium 3.6; Sodium 139; TSH 2.060     Physical Exam:    VS:  There were no vitals taken for this visit.    Wt Readings from Last 3 Encounters:  10/28/23 133 lb 6.4 oz (60.5 kg)  09/30/23 131 lb 9.6 oz (59.7 kg)  09/10/23 135 lb (61.2 kg)     GEN:  Well nourished, well developed in no acute distress CARDIAC: RRR, no murmurs, rubs, gallops RESPIRATORY:  Normal work of breathing MUSCULOSKELETAL: no edema    ASSESSMENT & PLAN:    Ventricular tachycardia Status post ablation December 2022 open apical LV focus Pt had recurrence shortly after discontinuation of amiodarone  Continue amiodarone , benefit outweighs  risk  Abbott dual chamber ICD  Device is at ERI I reviewed today's device interrogation.  See Paceart for details. There has been some noise on the atrial lead that has been stable over time and nonreproducible during interrogations.  He is rarely paced in the atrium. I explained the generator change procedure to the patient though he has been through it before.  I explained the risk of infection, bleeding, and damage to an existing lead.  The risk of serious complications including life-threatening complications is very low.  We will schedule the procedure today   Paroxysmal atrial fibrillation Maintaining sinus rhythm on amiodarone  Continue xarelto  20 -- ok to hold for procedures  Pulmonary mass EBUS and biopsy is scheduled for May 16    CHFrEF Has appointment with Dr. Shallow on May 2024 Imdur  30, Dr. 30, Entresto  97-100, metoprolol  XL 50  Aortic aneurysm Status post EVAR, concern for leak           Medication Adjustments/Labs and Tests Ordered: Current medicines are reviewed at length with the patient today.  Concerns regarding medicines are outlined above.  No orders of the defined types were placed in this encounter.  No orders of the defined types were placed in this encounter.    Signed, Eulas FORBES Furbish, MD  11/18/2023 11:23 AM    [facility]

## 2023-11-18 NOTE — Addendum Note (Signed)
 Addended by: CASIMIR ALDONA BRAVO on: 11/18/2023 02:22 PM   Modules accepted: Orders

## 2023-11-18 NOTE — Patient Instructions (Signed)
 Medication Instructions:  Your physician recommends that you continue on your current medications as directed. Please refer to the Current Medication list given to you today.  *If you need a refill on your cardiac medications before your next appointment, please call your pharmacy*  Lab Work: CBC and BMET today  If you have labs (blood work) drawn today and your tests are completely normal, you will receive your results only by: MyChart Message (if you have MyChart) OR A paper copy in the mail If you have any lab test that is abnormal or we need to change your treatment, we will call you to review the results.  Testing/Procedures: None ordered.   Follow-Up: At [facility], you and your health needs are our priority.  As part of our continuing mission to provide you with exceptional heart care, our providers are all part of one team.  This team includes your primary Cardiologist (physician) and Advanced Practice Providers or APPs (Physician Assistants and Nurse Practitioners) who all work together to provide you with the care you need, when you need it.  Your next appointment:   To be scheduled

## 2023-11-18 NOTE — Progress Notes (Signed)
  Electrophysiology Office Note:    Date:  11/18/2023   ID:  Benjamin Cook, DOB 09/03/1945, MRN [id number]  PCP:  Maree Leni Edyth DELENA, MD   [facility] Providers Cardiologist:  Redell Shallow, MD Electrophysiologist:  Eulas FORBES Furbish, MD     Referring MD: Maree Leni Edyth DELENA, MD   History of Present Illness:    Benjamin Cook is a 78 y.o. male with a hx listed below, significant for VT, CHFrEF, CAD, EVAR for large abdominal aortic aneurysm in 2019 referred for arrhythmia management.  He has had an ablation of an apical left ventricular VT focus in December 2022.  He had been maintained on amiodarone , but this was stopped in March 2024 for positive ESR. He was admitted in May for multiple VT episodes terminated by ATP.  He was also noted to have a large lung mass concerning for neoplasm. He was reloaded with amiodarone  with resolution of his sustained VT and significant improvement in burden of ventricular ectopy and NSVT.     EKGs/Labs/Other Studies Reviewed Today:    Echocardiogram:  TTE 06/03/22 EF 20-25%   Monitors:   Stress testing:   Advanced imaging:   Cardiac catherization   EKG:  Last EKG results: sinus rhythm, RBBB    Recent Labs: 10/28/2023: ALT 26; BUN 13; Creatinine 0.93; Hemoglobin 14.2; Platelet Count 161; Potassium 3.6; Sodium 139; TSH 2.060     Physical Exam:    VS:  There were no vitals taken for this visit.    Wt Readings from Last 3 Encounters:  10/28/23 133 lb 6.4 oz (60.5 kg)  09/30/23 131 lb 9.6 oz (59.7 kg)  09/10/23 135 lb (61.2 kg)     GEN:  Well nourished, well developed in no acute distress CARDIAC: RRR, no murmurs, rubs, gallops RESPIRATORY:  Normal work of breathing MUSCULOSKELETAL: no edema    ASSESSMENT & PLAN:    Ventricular tachycardia Status post ablation December 2022 open apical LV focus Pt had recurrence shortly after discontinuation of amiodarone  Continue amiodarone , benefit outweighs  risk  Abbott dual chamber ICD  Device is at ERI I reviewed today's device interrogation.  See Paceart for details. There has been some noise on the atrial lead that has been stable over time and nonreproducible during interrogations.  He is rarely paced in the atrium. I explained the generator change procedure to the patient though he has been through it before.  I explained the risk of infection, bleeding, and damage to an existing lead.  The risk of serious complications including life-threatening complications is very low.  We will schedule the procedure today   Paroxysmal atrial fibrillation Maintaining sinus rhythm on amiodarone  Continue xarelto  20 -- ok to hold for procedures  Pulmonary mass EBUS and biopsy is scheduled for May 16    CHFrEF Has appointment with Dr. Shallow on May 2024 Imdur  30, Dr. 30, Entresto  97-100, metoprolol  XL 50  Aortic aneurysm Status post EVAR, concern for leak           Medication Adjustments/Labs and Tests Ordered: Current medicines are reviewed at length with the patient today.  Concerns regarding medicines are outlined above.  No orders of the defined types were placed in this encounter.  No orders of the defined types were placed in this encounter.    Signed, Eulas FORBES Furbish, MD  11/18/2023 11:23 AM    [facility]

## 2023-11-19 ENCOUNTER — Encounter: Payer: Self-pay | Admitting: Hematology and Oncology

## 2023-11-19 LAB — BASIC METABOLIC PANEL WITH GFR
BUN/Creatinine Ratio: 13 (ref 10–24)
BUN: 13 mg/dL (ref 8–27)
CO2: 17 mmol/L — AB (ref 20–29)
Calcium: 9.4 mg/dL (ref 8.6–10.2)
Chloride: 100 mmol/L (ref 96–106)
Creatinine, Ser: 1 mg/dL (ref 0.76–1.27)
Glucose: 89 mg/dL (ref 70–99)
Potassium: 4.2 mmol/L (ref 3.5–5.2)
Sodium: 141 mmol/L (ref 134–144)
eGFR: 76 mL/min/1.73 (ref >59)

## 2023-11-19 LAB — CBC
Hematocrit: 48.4 % (ref 37.5–51.0)
Hemoglobin: 16.2 g/dL (ref 13.0–17.7)
MCH: 31.5 pg (ref 26.6–33.0)
MCHC: 33.5 g/dL (ref 31.5–35.7)
MCV: 94 fL (ref 79–97)
Platelets: 154 x10E3/uL (ref 150–450)
RBC: 5.15 x10E6/uL (ref 4.14–5.80)
RDW: 14 % (ref 11.6–15.4)
WBC: 10.6 x10E3/uL (ref 3.4–10.8)

## 2023-11-20 NOTE — Telephone Encounter (Signed)
 Pt scheduled on 11/10 for ICD Gen Change with Dr. Nancey.

## 2023-11-21 ENCOUNTER — Telehealth: Payer: Self-pay

## 2023-11-21 NOTE — Telephone Encounter (Signed)
-----   Message from Nurse Aldona R sent at 11/18/2023  2:59 PM EDT ----- Regarding: 12/15/23 ICD-Gen change Mealor Important: list procedure date as first item in subject line, followed by procedure type (e.g., 10/17/23 PPM implant)  Precert:  MD: Mealor Type of implant: ICD Device manufacturer: Abbott Diagnosis: ERI CPT code: ICD change out, single - 33262 C-code(s), including quantity (if indicated):  Procedure scheduled (date/time): 11/10 130  Procedure:  Scrub given? Yes  Medication instructions: hold your Xarelto  (Rivaroxaban ) for 2 day(s) prior to your procedure. Your last dose will be Friday, November 7, PM dose. Message sent to [facility]? No Added to calendar? Yes Orders entered? Yes Letter complete? Yes Scheduled with cath lab? Yes Labs ordered (CBC, BMET, PT/INR if on warfarin)? No Dye allergy? No Pre-meds ordered and instructions given? No,  Letter method: Given to patient  Special instructions: none H&P: 10/14  Follow-up:  Cassie/Angel, please schedule Routine.  Covering RN:  Please send this message to [employer], EP scheduler, EP Scheduling pool, and EP [employer].

## 2023-11-21 NOTE — Telephone Encounter (Signed)
Work up complete. 

## 2023-11-24 ENCOUNTER — Encounter (HOSPITAL_COMMUNITY): Payer: Self-pay

## 2023-11-24 ENCOUNTER — Encounter

## 2023-11-25 ENCOUNTER — Encounter: Payer: Self-pay | Admitting: Hematology and Oncology

## 2023-11-25 ENCOUNTER — Inpatient Hospital Stay

## 2023-11-25 ENCOUNTER — Inpatient Hospital Stay: Attending: Hematology and Oncology

## 2023-11-25 ENCOUNTER — Inpatient Hospital Stay: Admitting: Hematology and Oncology

## 2023-11-25 VITALS — BP 118/67 | HR 70 | Temp 97.5°F | Resp 18 | Ht 70.0 in | Wt 131.0 lb

## 2023-11-25 DIAGNOSIS — M545 Low back pain, unspecified: Secondary | ICD-10-CM | POA: Diagnosis not present

## 2023-11-25 DIAGNOSIS — C3491 Malignant neoplasm of unspecified part of right bronchus or lung: Secondary | ICD-10-CM | POA: Insufficient documentation

## 2023-11-25 DIAGNOSIS — Z23 Encounter for immunization: Secondary | ICD-10-CM | POA: Diagnosis not present

## 2023-11-25 DIAGNOSIS — Z299 Encounter for prophylactic measures, unspecified: Secondary | ICD-10-CM | POA: Insufficient documentation

## 2023-11-25 DIAGNOSIS — G8929 Other chronic pain: Secondary | ICD-10-CM

## 2023-11-25 DIAGNOSIS — Z79899 Other long term (current) drug therapy: Secondary | ICD-10-CM | POA: Diagnosis not present

## 2023-11-25 DIAGNOSIS — Z5112 Encounter for antineoplastic immunotherapy: Secondary | ICD-10-CM | POA: Insufficient documentation

## 2023-11-25 LAB — CMP (CANCER CENTER ONLY)
ALT: 28 U/L (ref 0–44)
AST: 32 U/L (ref 15–41)
Albumin: 3.7 g/dL (ref 3.5–5.0)
Alkaline Phosphatase: 71 U/L (ref 38–126)
Anion gap: 7 (ref 5–15)
BUN: 15 mg/dL (ref 8–23)
CO2: 27 mmol/L (ref 22–32)
Calcium: 9.3 mg/dL (ref 8.9–10.3)
Chloride: 105 mmol/L (ref 98–111)
Creatinine: 0.96 mg/dL (ref 0.61–1.24)
GFR, Estimated: >60 mL/min (ref >60)
Glucose, Bld: 119 mg/dL — ABNORMAL HIGH (ref 70–99)
Potassium: 3.4 mmol/L — ABNORMAL LOW (ref 3.5–5.1)
Sodium: 139 mmol/L (ref 135–145)
Total Bilirubin: 0.8 mg/dL (ref 0.0–1.2)
Total Protein: 7.2 g/dL (ref 6.5–8.1)

## 2023-11-25 LAB — CBC WITH DIFFERENTIAL (CANCER CENTER ONLY)
Abs Immature Granulocytes: 0.03 K/uL (ref 0.00–0.07)
Basophils Absolute: 0.1 K/uL (ref 0.0–0.1)
Basophils Relative: 1 %
Eosinophils Absolute: 0.2 K/uL (ref 0.0–0.5)
Eosinophils Relative: 3 %
HCT: 41.9 % (ref 39.0–52.0)
Hemoglobin: 14 g/dL (ref 13.0–17.0)
Immature Granulocytes: 0 %
Lymphocytes Relative: 16 %
Lymphs Abs: 1.1 K/uL (ref 0.7–4.0)
MCH: 30.4 pg (ref 26.0–34.0)
MCHC: 33.4 g/dL (ref 30.0–36.0)
MCV: 90.9 fL (ref 80.0–100.0)
Monocytes Absolute: 0.7 K/uL (ref 0.1–1.0)
Monocytes Relative: 10 %
Neutro Abs: 5 K/uL (ref 1.7–7.7)
Neutrophils Relative %: 70 %
Platelet Count: 176 K/uL (ref 150–400)
RBC: 4.61 MIL/uL (ref 4.22–5.81)
RDW: 15.5 % (ref 11.5–15.5)
WBC Count: 7.2 K/uL (ref 4.0–10.5)
nRBC: 0 % (ref 0.0–0.2)

## 2023-11-25 LAB — TSH: TSH: 3.03 u[IU]/mL (ref 0.350–4.500)

## 2023-11-25 MED ORDER — SODIUM CHLORIDE 0.9% FLUSH: 10.0000 mL | INTRAVENOUS | Status: DC | PRN

## 2023-11-25 MED ORDER — OXYCODONE HCL 5 MG PO TABS: 5.0000 mg | ORAL_TABLET | Freq: Four times a day (QID) | ORAL | 0 refills | Status: DC | PRN

## 2023-11-25 MED ORDER — SODIUM CHLORIDE 0.9 % IV SOLN: Freq: Once | INTRAVENOUS | Status: AC

## 2023-11-25 MED ORDER — SODIUM CHLORIDE 0.9 % IV SOLN
1500.0000 mg | Freq: Once | INTRAVENOUS | Status: AC
  Administered 2023-11-25: 1500 mg via INTRAVENOUS
  Filled 2023-11-25: qty 30

## 2023-11-25 MED ORDER — INFLUENZA VAC SPLIT HIGH-DOSE 0.5 ML IM SUSY: 0.5000 mL | PREFILLED_SYRINGE | INTRAMUSCULAR | Status: DC

## 2023-11-25 MED ORDER — INFLUENZA VAC SPLIT HIGH-DOSE 0.5 ML IM SUSY
0.5000 mL | PREFILLED_SYRINGE | Freq: Once | INTRAMUSCULAR | Status: AC
  Administered 2023-11-25: 0.5 mL via INTRAMUSCULAR
  Filled 2023-11-25: qty 0.5

## 2023-11-25 NOTE — Patient Instructions (Signed)
 [facility] - A DEPT OF [facility]  Discharge Instructions: You received your influenza shot today. Thank you for choosing [facility] to provide your oncology and hematology care.   If you have a lab appointment with the Cancer Center, please go directly to the Cancer Center and check in at the registration area.   Wear comfortable clothing and clothing appropriate for easy access to any Portacath or PICC line.   We strive to give you quality time with your provider. You may need to reschedule your appointment if you arrive late (15 or more minutes).  Arriving late affects you and other patients whose appointments are after yours.  Also, if you miss three or more appointments without notifying the office, you may be dismissed from the clinic at the provider's discretion.      For prescription refill requests, have your pharmacy contact our office and allow 72 hours for refills to be completed.    Today you received the following chemotherapy and/or immunotherapy agents: Durvalumab  (Imfinzi )    To help prevent nausea and vomiting after your treatment, we encourage you to take your nausea medication as directed.  BELOW ARE SYMPTOMS THAT SHOULD BE REPORTED IMMEDIATELY: *FEVER GREATER THAN 100.4 F (38 C) OR HIGHER *CHILLS OR SWEATING *NAUSEA AND VOMITING THAT IS NOT CONTROLLED WITH YOUR NAUSEA MEDICATION *UNUSUAL SHORTNESS OF BREATH *UNUSUAL BRUISING OR BLEEDING *URINARY PROBLEMS (pain or burning when urinating, or frequent urination) *BOWEL PROBLEMS (unusual diarrhea, constipation, pain near the anus) TENDERNESS IN MOUTH AND THROAT WITH OR WITHOUT PRESENCE OF ULCERS (sore throat, sores in mouth, or a toothache) UNUSUAL RASH, SWELLING OR PAIN  UNUSUAL VAGINAL DISCHARGE OR ITCHING   Items with * indicate a potential emergency and should be followed up as soon as possible or go to the Emergency Department if any problems should occur.  Please  show the CHEMOTHERAPY ALERT CARD or IMMUNOTHERAPY ALERT CARD at check-in to the Emergency Department and triage nurse.  Should you have questions after your visit or need to cancel or reschedule your appointment, please contact [facility] - A DEPT OF [facility]  Dept: [phone]  and follow the prompts.  Office hours are 8:00 a.m. to 4:30 p.m. Monday - Friday. Please note that voicemails left after 4:00 p.m. may not be returned until the following business day.  We are closed weekends and major holidays. You have access to a nurse at all times for urgent questions. Please call the main number to the clinic Dept: [phone] and follow the prompts.   For any non-urgent questions, you may also contact your provider using MyChart. We now offer e-Visits for anyone 4 and older to request care online for non-urgent symptoms. For details visit [email or web address].   Also download the MyChart app! Go to the app store, search MyChart, open the app, select [place], and log in with your MyChart username and password.  Influenza, Adult Influenza is also called the flu. It's an infection that affects your respiratory tract. This includes your nose, throat, windpipe, and lungs. The flu is contagious. This means it spreads easily from person to person. It causes symptoms that are like a cold. It can also cause a high fever and body aches. What are the causes? The flu is caused by the influenza virus. You can get it by: Breathing in droplets that are in the air after an infected person coughs or sneezes.  Touching something that has the virus on it and then touching your mouth, nose, or eyes. What increases the risk? You may be more likely to get the flu if: You don't wash your hands often. You're near a lot of people during cold and flu season. You touch your mouth, eyes, or nose without washing your hands first. You don't get a flu shot each year. You may also  be more at risk for the flu and serious problems, such as a lung infection called pneumonia, if: You're older than 65. You're pregnant. Your immune system is weak. Your immune system is your body's defense system. You have a long-term, or chronic, condition, such as: Heart, kidney, or lung disease. Diabetes. A liver disorder. Asthma. You're very overweight. You have anemia. This is when you don't have enough red blood cells in your body. What are the signs or symptoms? Flu symptoms often start all of a sudden. They may last 4-14 days and include: Fever and chills. Headaches, body aches, or muscle aches. Sore throat. Cough. Runny or stuffy nose. Discomfort in your chest. Not wanting to eat as much as normal. Feeling weak or tired. Feeling dizzy. Nausea or vomiting. How is this diagnosed? The flu may be diagnosed based on your symptoms and medical history. You may also have a physical exam. A swab may be taken from your nose or throat and tested for the virus. How is this treated? If the flu is found early, you can be treated with antiviral medicine. This may be given to you by mouth or through an IV. It can help you feel less sick and get better faster. Taking care of yourself at home can also help your symptoms get better. Your health care provider may tell you to: Take over-the-counter medicines. Drink lots of fluids. The flu often goes away on its own. If you have very bad symptoms or problems caused by the flu, you may need to be treated in a hospital. Follow these instructions at home: Activity Rest as needed. Get lots of sleep. Stay home from work or school as told by your provider. Leave home only to go see your provider. Do not leave home for other reasons until you don't have a fever for 24 hours without taking medicine. Eating and drinking Take an oral rehydration solution (ORS). This is a drink that is sold at pharmacies and stores. Drink enough fluid to keep your pee  pale yellow. Try to drink small amounts of clear fluids. These include water, ice chips, fruit juice mixed with water, and low-calorie sports drinks. Try to eat bland foods that are easy to digest. These include bananas, applesauce, rice, lean meats, toast, and crackers. Avoid drinks that have a lot of sugar or caffeine in them. These include energy drinks, regular sports drinks, and soda. Do not drink alcohol. Do not eat spicy or fatty foods. General instructions     Take your medicines only as told by your provider. Use a cool mist humidifier to add moisture to the air in your home. This can make it easier for you to breathe. You should also clean the humidifier every day. To do so: Empty the water. Pour clean water in. Cover your mouth and nose when you cough or sneeze. Wash your hands with soap and water often and for at least 20 seconds. It's extra important to do so after you cough or sneeze. If you can't use soap and water, use hand sanitizer. How is this prevented?  Get a flu shot every year. Ask your provider when you should get your flu shot. Stay away from people who are sick during fall and winter. Fall and winter are cold and flu season. Contact a health care provider if: You get new symptoms. You have chest pain. You have watery poop, also called diarrhea. You have a fever. Your cough gets worse. You start to have more mucus. You feel like you may vomit, or you vomit. Get help right away if: You become short of breath or have trouble breathing. Your skin or nails turn blue. You have very bad pain or stiffness in your neck. You get a sudden headache or pain in your face or ear. You vomit each time you eat or drink. These symptoms may be an emergency. Call 911 right away. Do not wait to see if the symptoms will go away. Do not drive yourself to the hospital. This information is not intended to replace advice given to you by your health care provider. Make sure you  discuss any questions you have with your health care provider. Document Revised: 10/24/2022 Document Reviewed: 02/28/2022 Elsevier Patient Education  2024 [employer].

## 2023-11-25 NOTE — Progress Notes (Signed)
 [facility] OFFICE PROGRESS NOTE  Patient Care Team: Maree Leni Edyth DELENA, MD as PCP - General (Family Medicine) Pietro Redell RAMAN, MD as PCP - Cardiology (Cardiology) Mealor, Eulas BRAVO, [name]  Assessment & Plan Non-small cell cancer of right lung [facility]) He was diagnosed with stage III non-small cell lung cancer in May 2024 Pathology: Non-small cell lung cancer, limited material for subtype, Foundation One test showed no actionable mutations. 6TMB, MSI low  It was unresectable at the time of diagnosis He received concurrent chemotherapy with combination of carboplatin  and paclitaxel  along with radiation treatment, followed by maintenance durvalumab  since August 2024  PET/CT imaging in June 2025 showed no definitive evidence of cancer recurrence He tolerated durvalumab  well, without major side effects We will proceed without delay I plan to repeat imaging study again at the end of the year  Chronic midline low back pain without sciatica He has chronic lower back pain for a long time He has been taking intermittent doses of oxycodone  and Tylenol  as needed I recommend he continues the same He has no major side effects from these medications He will continue the oxycodone  I prescribed, refill today Preventive measure We discussed the importance of preventive care and reviewed the vaccination programs. He does not have any prior allergic reactions to influenza vaccination. He agrees to proceed with influenza vaccination today and we will administer it today at the clinic.   Orders Placed This Encounter  Procedures   CBC with Differential (Cancer Center Only)    Standing Status:   Future    Expected Date:   12/23/2023    Expiration Date:   12/22/2024   CMP (Cancer Center only)    Standing Status:   Future    Expected Date:   12/23/2023    Expiration Date:   12/22/2024   T4    Standing Status:   Future    Expected Date:   12/23/2023     Expiration Date:   12/22/2024   TSH    Standing Status:   Future    Expected Date:   12/23/2023    Expiration Date:   12/22/2024   CBC with Differential (Cancer Center Only)    Standing Status:   Future    Expected Date:   01/20/2024    Expiration Date:   01/19/2025   CMP (Cancer Center only)    Standing Status:   Future    Expected Date:   01/20/2024    Expiration Date:   01/19/2025   T4    Standing Status:   Future    Expected Date:   01/20/2024    Expiration Date:   01/19/2025   TSH    Standing Status:   Future    Expected Date:   01/20/2024    Expiration Date:   01/19/2025     [name], MD  INTERVAL HISTORY: he returns for treatment follow-up Complications related to previous cycle of chemotherapy included none except for chronic low back pain which is unrelated to treatment Denies recent cough or infection We discussed importance of influenza vaccination  PHYSICAL EXAMINATION: ECOG PERFORMANCE STATUS: 1 - Symptomatic but completely ambulatory  No results found for: CAN125    Latest Ref Rng & Units 11/25/2023   10:00 AM 11/18/2023    2:02 PM 10/28/2023   11:42 AM  CBC  WBC 4.0 - 10.5 K/uL 7.2  10.6  6.8   Hemoglobin 13.0 - 17.0 g/dL 85.9  83.7  14.2   Hematocrit 39.0 - 52.0 % 41.9  48.4  41.3   Platelets 150 - 400 K/uL 176  154  161       Chemistry      Component Value Date/Time   NA 141 11/18/2023 1402   K 4.2 11/18/2023 1402   CL 100 11/18/2023 1402   CO2 17 (L) 11/18/2023 1402   BUN 13 11/18/2023 1402   CREATININE 1.00 11/18/2023 1402   CREATININE 0.93 10/28/2023 1142   CREATININE 0.88 09/15/2015 0959      Component Value Date/Time   CALCIUM  9.4 11/18/2023 1402   ALKPHOS 71 10/28/2023 1142   AST 28 10/28/2023 1142   ALT 26 10/28/2023 1142   BILITOT 0.8 10/28/2023 1142       Vitals:   11/25/23 1026  BP: 118/67  Pulse: 70  Resp: 18  Temp: (!) 97.5 F (36.4 C)  SpO2: 95%   Filed Weights   11/25/23 1026  Weight: 131 lb (59.4 kg)    Other relevant data reviewed during this visit included CBC, CMP, TSH

## 2023-11-25 NOTE — Assessment & Plan Note (Addendum)
We discussed the importance of preventive care and reviewed the vaccination programs. He does not have any prior allergic reactions to influenza vaccination. He agrees to proceed with influenza vaccination today and we will administer it today at the clinic.  

## 2023-11-25 NOTE — Assessment & Plan Note (Addendum)
 He has chronic lower back pain for a long time He has been taking intermittent doses of oxycodone  and Tylenol  as needed I recommend he continues the same He has no major side effects from these medications He will continue the oxycodone  I prescribed, refill today

## 2023-11-25 NOTE — Assessment & Plan Note (Addendum)
 He was diagnosed with stage III non-small cell lung cancer in May 2024 Pathology: Non-small cell lung cancer, limited material for subtype, Foundation One test showed no actionable mutations. 6TMB, MSI low  It was unresectable at the time of diagnosis He received concurrent chemotherapy with combination of carboplatin  and paclitaxel  along with radiation treatment, followed by maintenance durvalumab  since August 2024  PET/CT imaging in June 2025 showed no definitive evidence of cancer recurrence He tolerated durvalumab  well, without major side effects We will proceed without delay I plan to repeat imaging study again at the end of the year

## 2023-11-26 LAB — T4: T4, Total: 12.2 ug/dL — ABNORMAL HIGH (ref 4.5–12.0)

## 2023-11-27 ENCOUNTER — Ambulatory Visit: Payer: Self-pay | Admitting: Cardiovascular Disease

## 2023-11-27 ENCOUNTER — Encounter

## 2023-12-08 ENCOUNTER — Telehealth (HOSPITAL_COMMUNITY): Payer: Self-pay

## 2023-12-08 ENCOUNTER — Ambulatory Visit

## 2023-12-08 DIAGNOSIS — I480 Paroxysmal atrial fibrillation: Secondary | ICD-10-CM

## 2023-12-08 NOTE — Telephone Encounter (Addendum)
 Spoke with patient to discuss upcoming procedure. He reports he does not use MyChart.   Confirmed patient is scheduled for a ICD generator change on Monday, November 10 with Dr. Dr. Nancey. Instructed patient to arrive at the Main Entrance A at [facility]: [address] [place], [place] [address] and check in at Admitting at 11:30 AM.   Labs completed  Any recent signs of acute illness or been started on antibiotics? No  Any new medications started? No Any medications to hold? Yes  HOLD Empagliflozin  (Jardiance ) for 3 days prior to the procedure. Last dose will be on Thursday, November 06.  HOLD Xarelto  (Rivaroxaban ) for 2 day(s) prior to your procedure. Last dose will be Friday, November 7, PM dose.  NO eating or drinking after midnight (NPO after 12:00am) except for sips of water with your medications. **DO NOT take Furosemide  or Spironolactone  the morning of procedure  No eating or drinking after midnight prior to procedure.   The night before your procedure and the morning of your procedure, wash thoroughly with the CHG surgical soap from the neck down, paying special attention to the area where your procedure will be performed.  Advised of plan to go home the same day and will only stay overnight if medically necessary. You MUST have a responsible adult to drive you home and MUST be with you the first 24 hours after you arrive home.  Informed patient a nurse will call a day before the procedure to confirm arrival time and ensure instructions are followed.  Patient verbalized understanding to all instructions provided and agreed to proceed with procedure.

## 2023-12-11 NOTE — Progress Notes (Unsigned)
 Remote ICD Transmission

## 2023-12-14 NOTE — Pre-Procedure Instructions (Signed)
 Instructed patient on the following items: Arrival time 1130 Nothing to eat or drink after midnight No meds AM of procedure Responsible person to drive you home and stay with you for 24 hrs Wash with special soap night before and morning of procedure If on anti-coagulant drug instructions Xarelto - last dose 11/7.

## 2023-12-15 ENCOUNTER — Encounter: Payer: Self-pay | Admitting: Emergency Medicine

## 2023-12-15 ENCOUNTER — Ambulatory Visit (HOSPITAL_COMMUNITY)
Admission: RE | Admit: 2023-12-15 | Discharge: 2023-12-15 | Disposition: A | Discharge status: Home / Self Care | Attending: Cardiovascular Disease | Admitting: Cardiovascular Disease

## 2023-12-15 ENCOUNTER — Encounter (HOSPITAL_COMMUNITY): Payer: Self-pay | Admitting: Cardiovascular Disease

## 2023-12-15 ENCOUNTER — Other Ambulatory Visit: Payer: Self-pay

## 2023-12-15 ENCOUNTER — Ambulatory Visit (HOSPITAL_COMMUNITY)
Admission: RE | Disposition: A | Discharge status: Home / Self Care | Payer: Self-pay | Source: Home / Self Care | Attending: Cardiovascular Disease

## 2023-12-15 DIAGNOSIS — Z7901 Long term (current) use of anticoagulants: Secondary | ICD-10-CM | POA: Diagnosis not present

## 2023-12-15 DIAGNOSIS — R918 Other nonspecific abnormal finding of lung field: Secondary | ICD-10-CM | POA: Insufficient documentation

## 2023-12-15 DIAGNOSIS — I255 Ischemic cardiomyopathy: Secondary | ICD-10-CM

## 2023-12-15 DIAGNOSIS — Z4502 Encounter for adjustment and management of automatic implantable cardiac defibrillator: Secondary | ICD-10-CM | POA: Insufficient documentation

## 2023-12-15 DIAGNOSIS — I480 Paroxysmal atrial fibrillation: Secondary | ICD-10-CM | POA: Insufficient documentation

## 2023-12-15 DIAGNOSIS — I2510 Atherosclerotic heart disease of native coronary artery without angina pectoris: Secondary | ICD-10-CM | POA: Diagnosis not present

## 2023-12-15 DIAGNOSIS — I472 Ventricular tachycardia, unspecified: Secondary | ICD-10-CM | POA: Diagnosis not present

## 2023-12-15 DIAGNOSIS — I719 Aortic aneurysm of unspecified site, without rupture: Secondary | ICD-10-CM | POA: Diagnosis not present

## 2023-12-15 DIAGNOSIS — I5022 Chronic systolic (congestive) heart failure: Secondary | ICD-10-CM | POA: Insufficient documentation

## 2023-12-15 DIAGNOSIS — Z95810 Presence of automatic (implantable) cardiac defibrillator: Secondary | ICD-10-CM

## 2023-12-15 DIAGNOSIS — Z79899 Other long term (current) drug therapy: Secondary | ICD-10-CM | POA: Diagnosis not present

## 2023-12-15 HISTORY — PX: ICD GENERATOR CHANGEOUT: EP1231

## 2023-12-15 SURGERY — ICD GENERATOR CHANGEOUT

## 2023-12-15 MED ORDER — CEFAZOLIN SODIUM-DEXTROSE 2-4 GM/100ML-% IV SOLN
INTRAVENOUS | Status: DC
Start: 2023-12-15 — End: 2023-12-15
  Filled 2023-12-15: qty 100

## 2023-12-15 MED ORDER — SODIUM CHLORIDE 0.9 % IV SOLN: INTRAVENOUS | Status: DC

## 2023-12-15 MED ORDER — ACETAMINOPHEN 325 MG PO TABS: 325.0000 mg | ORAL_TABLET | ORAL | Status: DC | PRN

## 2023-12-15 MED ORDER — LIDOCAINE HCL (PF) 1 % IJ SOLN
INTRAMUSCULAR | Status: AC
  Filled 2023-12-15: qty 60

## 2023-12-15 MED ORDER — POVIDONE-IODINE 10 % EX SWAB: 2.0000 | Freq: Once | CUTANEOUS | Status: DC

## 2023-12-15 MED ORDER — MIDAZOLAM HCL 2 MG/2ML IJ SOLN
INTRAMUSCULAR | Status: AC
  Filled 2023-12-15: qty 2

## 2023-12-15 MED ORDER — FENTANYL CITRATE (PF) 100 MCG/2ML IJ SOLN
INTRAMUSCULAR | Status: DC | PRN
  Administered 2023-12-15 (×3): 25 ug via INTRAVENOUS

## 2023-12-15 MED ORDER — SODIUM CHLORIDE 0.9 % IV SOLN
80.0000 mg | INTRAVENOUS | Status: AC
  Administered 2023-12-15: 80 mg

## 2023-12-15 MED ORDER — SODIUM CHLORIDE 0.9 % IV SOLN
INTRAVENOUS | Status: AC
  Filled 2023-12-15: qty 2

## 2023-12-15 MED ORDER — MIDAZOLAM HCL 5 MG/5ML IJ SOLN
INTRAMUSCULAR | Status: DC | PRN
  Administered 2023-12-15 (×3): 1 mg via INTRAVENOUS

## 2023-12-15 MED ORDER — FENTANYL CITRATE (PF) 100 MCG/2ML IJ SOLN
INTRAMUSCULAR | Status: AC
  Filled 2023-12-15: qty 2

## 2023-12-15 MED ORDER — CHLORHEXIDINE GLUCONATE 4 % EX SOLN
4.0000 | Freq: Once | CUTANEOUS | Status: DC
  Filled 2023-12-15: qty 60

## 2023-12-15 MED ORDER — CEFAZOLIN SODIUM-DEXTROSE 2-4 GM/100ML-% IV SOLN
2.0000 g | INTRAVENOUS | Status: AC
  Administered 2023-12-15: 2 g via INTRAVENOUS

## 2023-12-15 MED ORDER — LIDOCAINE HCL (PF) 1 % IJ SOLN
INTRAMUSCULAR | Status: DC | PRN
Start: 2023-12-15 — End: 2023-12-15
  Administered 2023-12-15: 60 mL

## 2023-12-15 SURGICAL SUPPLY — 5 items
CABLE SURGICAL S-101-97-12 (CABLE) ×1 IMPLANT
ICD GALLANT DR CDDRA500Q (ICD Generator) IMPLANT
PAD DEFIB RADIO PHYSIO CONN (PAD) ×1 IMPLANT
POUCH AIGIS-R ANTIBACT ICD LRG (Mesh General) IMPLANT
TRAY PACEMAKER INSERTION (PACKS) ×1 IMPLANT

## 2023-12-15 NOTE — Discharge Instructions (Signed)

## 2023-12-15 NOTE — Progress Notes (Signed)
 Patient discharge teaching completed with patient and family member. Teach back performed. Device representative has spoken with the patient. Patient ambulated to the bathroom at 1530, no issues. Patient was assisted putting their clothes on by family member. Iv removed, catheter intact, no complications, patient escorted out via wheelchair to family member's vehicle.

## 2023-12-15 NOTE — Interval H&P Note (Signed)
 History and Physical Interval Note:  12/15/2023 1:01 PM  Benjamin Cook  has presented today for surgery, with the diagnosis of eri.  The various methods of treatment have been discussed with the patient and family. After consideration of risks, benefits and other options for treatment, the patient has consented to  Procedure(s): ICD GENERATOR CHANGEOUT (N/A) as a surgical intervention.  The patient's history has been reviewed, patient examined, no change in status, stable for surgery.  I have reviewed the patient's chart and labs.  Questions were answered to the patient's satisfaction.     Hilario Robarts E Natika Geyer

## 2023-12-16 MED FILL — Midazolam HCl Inj 2 MG/2ML (Base Equivalent): INTRAMUSCULAR | Qty: 2 | Status: AC

## 2023-12-16 MED FILL — Midazolam HCl Inj 2 MG/2ML (Base Equivalent): INTRAMUSCULAR | Qty: 1 | Status: AC

## 2023-12-17 ENCOUNTER — Other Ambulatory Visit: Payer: Self-pay

## 2023-12-23 ENCOUNTER — Inpatient Hospital Stay: Attending: Hematology and Oncology

## 2023-12-23 ENCOUNTER — Inpatient Hospital Stay (HOSPITAL_BASED_OUTPATIENT_CLINIC_OR_DEPARTMENT_OTHER): Admitting: Hematology and Oncology

## 2023-12-23 ENCOUNTER — Encounter: Payer: Self-pay | Admitting: Hematology and Oncology

## 2023-12-23 ENCOUNTER — Inpatient Hospital Stay

## 2023-12-23 VITALS — HR 65 | Temp 97.9°F | Resp 18

## 2023-12-23 VITALS — BP 115/58 | HR 18 | Ht 70.0 in | Wt 131.6 lb

## 2023-12-23 DIAGNOSIS — C3491 Malignant neoplasm of unspecified part of right bronchus or lung: Secondary | ICD-10-CM | POA: Diagnosis present

## 2023-12-23 DIAGNOSIS — I5042 Chronic combined systolic (congestive) and diastolic (congestive) heart failure: Secondary | ICD-10-CM

## 2023-12-23 DIAGNOSIS — Z950 Presence of cardiac pacemaker: Secondary | ICD-10-CM | POA: Insufficient documentation

## 2023-12-23 DIAGNOSIS — Z5112 Encounter for antineoplastic immunotherapy: Secondary | ICD-10-CM | POA: Insufficient documentation

## 2023-12-23 DIAGNOSIS — Z79899 Other long term (current) drug therapy: Secondary | ICD-10-CM | POA: Diagnosis not present

## 2023-12-23 LAB — CMP (CANCER CENTER ONLY)
ALT: 21 U/L (ref 0–44)
AST: 30 U/L (ref 15–41)
Albumin: 3.6 g/dL (ref 3.5–5.0)
Alkaline Phosphatase: 79 U/L (ref 38–126)
Anion gap: 5 (ref 5–15)
BUN: 10 mg/dL (ref 8–23)
CO2: 27 mmol/L (ref 22–32)
Calcium: 8.8 mg/dL — ABNORMAL LOW (ref 8.9–10.3)
Chloride: 107 mmol/L (ref 98–111)
Creatinine: 0.94 mg/dL (ref 0.61–1.24)
GFR, Estimated: >60 mL/min (ref >60)
Glucose, Bld: 118 mg/dL — ABNORMAL HIGH (ref 70–99)
Potassium: 3.9 mmol/L (ref 3.5–5.1)
Sodium: 139 mmol/L (ref 135–145)
Total Bilirubin: 0.7 mg/dL (ref 0.0–1.2)
Total Protein: 6.9 g/dL (ref 6.5–8.1)

## 2023-12-23 LAB — CBC WITH DIFFERENTIAL (CANCER CENTER ONLY)
Abs Immature Granulocytes: 0.06 K/uL (ref 0.00–0.07)
Basophils Absolute: 0.1 K/uL (ref 0.0–0.1)
Basophils Relative: 1 %
Eosinophils Absolute: 0.2 K/uL (ref 0.0–0.5)
Eosinophils Relative: 4 %
HCT: 40.1 % (ref 39.0–52.0)
Hemoglobin: 13.5 g/dL (ref 13.0–17.0)
Immature Granulocytes: 1 %
Lymphocytes Relative: 18 %
Lymphs Abs: 1.1 K/uL (ref 0.7–4.0)
MCH: 30.7 pg (ref 26.0–34.0)
MCHC: 33.7 g/dL (ref 30.0–36.0)
MCV: 91.1 fL (ref 80.0–100.0)
Monocytes Absolute: 0.8 K/uL (ref 0.1–1.0)
Monocytes Relative: 13 %
Neutro Abs: 3.9 K/uL (ref 1.7–7.7)
Neutrophils Relative %: 63 %
Platelet Count: 162 K/uL (ref 150–400)
RBC: 4.4 MIL/uL (ref 4.22–5.81)
RDW: 15.7 % — ABNORMAL HIGH (ref 11.5–15.5)
WBC Count: 6.1 K/uL (ref 4.0–10.5)
nRBC: 0 % (ref 0.0–0.2)

## 2023-12-23 LAB — TSH: TSH: 4.26 u[IU]/mL (ref 0.350–4.500)

## 2023-12-23 MED ORDER — SODIUM CHLORIDE 0.9 % IV SOLN: Freq: Once | INTRAVENOUS | Status: AC

## 2023-12-23 MED ORDER — SODIUM CHLORIDE 0.9 % IV SOLN
1500.0000 mg | Freq: Once | INTRAVENOUS | Status: AC
  Administered 2023-12-23: 1500 mg via INTRAVENOUS
  Filled 2023-12-23: qty 30

## 2023-12-23 NOTE — Progress Notes (Signed)
 [facility] OFFICE PROGRESS NOTE  Patient Care Team: Maree Leni Edyth DELENA, MD as PCP - General (Family Medicine) Pietro Redell RAMAN, MD as PCP - Cardiology (Cardiology) Mealor, Eulas BRAVO, [name]  Assessment & Plan Non-small cell cancer of right lung [facility]) He was diagnosed with stage III non-small cell lung cancer in May 2024 Pathology: Non-small cell lung cancer, limited material for subtype, Foundation One test showed no actionable mutations. 6TMB, MSI low  It was unresectable at the time of diagnosis He received concurrent chemotherapy with combination of carboplatin  and paclitaxel  along with radiation treatment, followed by maintenance durvalumab  since August 2024  PET/CT imaging in June 2025 showed no definitive evidence of cancer recurrence He tolerated durvalumab  well, without major side effects We will proceed without delay I plan to repeat imaging study again next month Chronic combined systolic and diastolic heart failure ([name]) He had recent pacemaker implanted without difficulties He has no signs or symptoms of congestive heart failure and we will continue medical management  Orders Placed This Encounter  Procedures   NM PET Image Restag (PS) Skull Base To Thigh    Standing Status:   Future    Expected Date:   01/12/2024    Expiration Date:   12/22/2024    If indicated for the ordered procedure, I authorize the administration of a radiopharmaceutical per Radiology protocol:   Yes    Preferred imaging location?:   [facility]   CBC with Differential (Cancer Center Only)    Standing Status:   Future    Expected Date:   02/17/2024    Expiration Date:   02/16/2025   CMP (Cancer Center only)    Standing Status:   Future    Expected Date:   02/17/2024    Expiration Date:   02/16/2025   T4    Standing Status:   Future    Expected Date:   02/17/2024    Expiration Date:   02/16/2025   TSH    Standing Status:   Future    Expected  Date:   02/17/2024    Expiration Date:   02/16/2025     [name], MD  INTERVAL HISTORY: he returns for treatment follow-up Complications related to previous cycle of chemotherapy included none and recent hospitalization/ ER visit, since last time I saw him, he has pacemaker implanted He has no problems No side effects from treatment so far  PHYSICAL EXAMINATION: ECOG PERFORMANCE STATUS: 0 - Asymptomatic  No results found for: CAN125    Latest Ref Rng & Units 12/23/2023    9:56 AM 11/25/2023   10:00 AM 11/18/2023    2:02 PM  CBC  WBC 4.0 - 10.5 K/uL 6.1  7.2  10.6   Hemoglobin 13.0 - 17.0 g/dL 86.4  85.9  83.7   Hematocrit 39.0 - 52.0 % 40.1  41.9  48.4   Platelets 150 - 400 K/uL 162  176  154       Chemistry      Component Value Date/Time   NA 139 12/23/2023 0956   NA 141 11/18/2023 1402   K 3.9 12/23/2023 0956   CL 107 12/23/2023 0956   CO2 27 12/23/2023 0956   BUN 10 12/23/2023 0956   BUN 13 11/18/2023 1402   CREATININE 0.94 12/23/2023 0956   CREATININE 0.88 09/15/2015 0959      Component Value Date/Time   CALCIUM  8.8 (L) 12/23/2023 0956   ALKPHOS 79 12/23/2023 0956  AST 30 12/23/2023 0956   ALT 21 12/23/2023 0956   BILITOT 0.7 12/23/2023 0956       Vitals:   12/23/23 1027  BP: (!) 115/58  Pulse: (!) 18  SpO2: 96%   Filed Weights   12/23/23 1027  Weight: 131 lb 9.6 oz (59.7 kg)   Other relevant data reviewed during this visit included CBC and CMP

## 2023-12-23 NOTE — Assessment & Plan Note (Addendum)
 He was diagnosed with stage III non-small cell lung cancer in May 2024 Pathology: Non-small cell lung cancer, limited material for subtype, Foundation One test showed no actionable mutations. 6TMB, MSI low  It was unresectable at the time of diagnosis He received concurrent chemotherapy with combination of carboplatin  and paclitaxel  along with radiation treatment, followed by maintenance durvalumab  since August 2024  PET/CT imaging in June 2025 showed no definitive evidence of cancer recurrence He tolerated durvalumab  well, without major side effects We will proceed without delay I plan to repeat imaging study again next month

## 2023-12-23 NOTE — Patient Instructions (Addendum)
 [facility] - A DEPT OF [facility]  Discharge Instructions: You received your influenza shot today. Thank you for choosing [facility] to provide your oncology and hematology care.   If you have a lab appointment with the Cancer Center, please go directly to the Cancer Center and check in at the registration area.   Wear comfortable clothing and clothing appropriate for easy access to any Portacath or PICC line.   We strive to give you quality time with your provider. You may need to reschedule your appointment if you arrive late (15 or more minutes).  Arriving late affects you and other patients whose appointments are after yours.  Also, if you miss three or more appointments without notifying the office, you may be dismissed from the clinic at the provider's discretion.      For prescription refill requests, have your pharmacy contact our office and allow 72 hours for refills to be completed.    Today you received the following chemotherapy and/or immunotherapy agents: Durvalumab  (Imfinzi )    To help prevent nausea and vomiting after your treatment, we encourage you to take your nausea medication as directed.  BELOW ARE SYMPTOMS THAT SHOULD BE REPORTED IMMEDIATELY: *FEVER GREATER THAN 100.4 F (38 C) OR HIGHER *CHILLS OR SWEATING *NAUSEA AND VOMITING THAT IS NOT CONTROLLED WITH YOUR NAUSEA MEDICATION *UNUSUAL SHORTNESS OF BREATH *UNUSUAL BRUISING OR BLEEDING *URINARY PROBLEMS (pain or burning when urinating, or frequent urination) *BOWEL PROBLEMS (unusual diarrhea, constipation, pain near the anus) TENDERNESS IN MOUTH AND THROAT WITH OR WITHOUT PRESENCE OF ULCERS (sore throat, sores in mouth, or a toothache) UNUSUAL RASH, SWELLING OR PAIN  UNUSUAL VAGINAL DISCHARGE OR ITCHING   Items with * indicate a potential emergency and should be followed up as soon as possible or go to the Emergency Department if any problems should occur.  Please  show the CHEMOTHERAPY ALERT CARD or IMMUNOTHERAPY ALERT CARD at check-in to the Emergency Department and triage nurse.  Should you have questions after your visit or need to cancel or reschedule your appointment, please contact [facility] - A DEPT OF [facility]  Dept: [phone]  and follow the prompts.  Office hours are 8:00 a.m. to 4:30 p.m. Monday - Friday. Please note that voicemails left after 4:00 p.m. may not be returned until the following business day.  We are closed weekends and major holidays. You have access to a nurse at all times for urgent questions. Please call the main number to the clinic Dept: [phone] and follow the prompts.   For any non-urgent questions, you may also contact your provider using MyChart. We now offer e-Visits for anyone 65 and older to request care online for non-urgent symptoms. For details visit [email or web address].   Also download the MyChart app! Go to the app store, search MyChart, open the app, select [place], and log in with your MyChart username and password.  Influenza, Adult Influenza is also called the flu. It's an infection that affects your respiratory tract. This includes your nose, throat, windpipe, and lungs. The flu is contagious. This means it spreads easily from person to person. It causes symptoms that are like a cold. It can also cause a high fever and body aches. What are the causes? The flu is caused by the influenza virus. You can get it by: Breathing in droplets that are in the air after an infected person coughs or sneezes.  Touching something that has the virus on it and then touching your mouth, nose, or eyes. What increases the risk? You may be more likely to get the flu if: You don't wash your hands often. You're near a lot of people during cold and flu season. You touch your mouth, eyes, or nose without washing your hands first. You don't get a flu shot each year. You may also  be more at risk for the flu and serious problems, such as a lung infection called pneumonia, if: You're older than 65. You're pregnant. Your immune system is weak. Your immune system is your body's defense system. You have a long-term, or chronic, condition, such as: Heart, kidney, or lung disease. Diabetes. A liver disorder. Asthma. You're very overweight. You have anemia. This is when you don't have enough red blood cells in your body. What are the signs or symptoms? Flu symptoms often start all of a sudden. They may last 4-14 days and include: Fever and chills. Headaches, body aches, or muscle aches. Sore throat. Cough. Runny or stuffy nose. Discomfort in your chest. Not wanting to eat as much as normal. Feeling weak or tired. Feeling dizzy. Nausea or vomiting. How is this diagnosed? The flu may be diagnosed based on your symptoms and medical history. You may also have a physical exam. A swab may be taken from your nose or throat and tested for the virus. How is this treated? If the flu is found early, you can be treated with antiviral medicine. This may be given to you by mouth or through an IV. It can help you feel less sick and get better faster. Taking care of yourself at home can also help your symptoms get better. Your health care provider may tell you to: Take over-the-counter medicines. Drink lots of fluids. The flu often goes away on its own. If you have very bad symptoms or problems caused by the flu, you may need to be treated in a hospital. Follow these instructions at home: Activity Rest as needed. Get lots of sleep. Stay home from work or school as told by your provider. Leave home only to go see your provider. Do not leave home for other reasons until you don't have a fever for 24 hours without taking medicine. Eating and drinking Take an oral rehydration solution (ORS). This is a drink that is sold at pharmacies and stores. Drink enough fluid to keep your pee  pale yellow. Try to drink small amounts of clear fluids. These include water, ice chips, fruit juice mixed with water, and low-calorie sports drinks. Try to eat bland foods that are easy to digest. These include bananas, applesauce, rice, lean meats, toast, and crackers. Avoid drinks that have a lot of sugar or caffeine in them. These include energy drinks, regular sports drinks, and soda. Do not drink alcohol. Do not eat spicy or fatty foods. General instructions     Take your medicines only as told by your provider. Use a cool mist humidifier to add moisture to the air in your home. This can make it easier for you to breathe. You should also clean the humidifier every day. To do so: Empty the water. Pour clean water in. Cover your mouth and nose when you cough or sneeze. Wash your hands with soap and water often and for at least 20 seconds. It's extra important to do so after you cough or sneeze. If you can't use soap and water, use hand sanitizer. How is this prevented?  Get a flu shot every year. Ask your provider when you should get your flu shot. Stay away from people who are sick during fall and winter. Fall and winter are cold and flu season. Contact a health care provider if: You get new symptoms. You have chest pain. You have watery poop, also called diarrhea. You have a fever. Your cough gets worse. You start to have more mucus. You feel like you may vomit, or you vomit. Get help right away if: You become short of breath or have trouble breathing. Your skin or nails turn blue. You have very bad pain or stiffness in your neck. You get a sudden headache or pain in your face or ear. You vomit each time you eat or drink. These symptoms may be an emergency. Call 911 right away. Do not wait to see if the symptoms will go away. Do not drive yourself to the hospital. This information is not intended to replace advice given to you by your health care provider. Make sure you  discuss any questions you have with your health care provider. Document Revised: 10/24/2022 Document Reviewed: 02/28/2022 Elsevier Patient Education  2024 [employer] - A DEPT OF [facility]  Discharge Instructions: You received your influenza shot today. Thank you for choosing [facility] to provide your oncology and hematology care.   If you have a lab appointment with the Cancer Center, please go directly to the Cancer Center and check in at the registration area.   Wear comfortable clothing and clothing appropriate for easy access to any Portacath or PICC line.   We strive to give you quality time with your provider. You may need to reschedule your appointment if you arrive late (15 or more minutes).  Arriving late affects you and other patients whose appointments are after yours.  Also, if you miss three or more appointments without notifying the office, you may be dismissed from the clinic at the provider's discretion.      For prescription refill requests, have your pharmacy contact our office and allow 72 hours for refills to be completed.    Today you received the following chemotherapy and/or immunotherapy agents: Durvalumab  (Imfinzi )    To help prevent nausea and vomiting after your treatment, we encourage you to take your nausea medication as directed.  BELOW ARE SYMPTOMS THAT SHOULD BE REPORTED IMMEDIATELY: *FEVER GREATER THAN 100.4 F (38 C) OR HIGHER *CHILLS OR SWEATING *NAUSEA AND VOMITING THAT IS NOT CONTROLLED WITH YOUR NAUSEA MEDICATION *UNUSUAL SHORTNESS OF BREATH *UNUSUAL BRUISING OR BLEEDING *URINARY PROBLEMS (pain or burning when urinating, or frequent urination) *BOWEL PROBLEMS (unusual diarrhea, constipation, pain near the anus) TENDERNESS IN MOUTH AND THROAT WITH OR WITHOUT PRESENCE OF ULCERS (sore throat, sores in mouth, or a toothache) UNUSUAL RASH, SWELLING OR PAIN  UNUSUAL VAGINAL DISCHARGE OR  ITCHING   Items with * indicate a potential emergency and should be followed up as soon as possible or go to the Emergency Department if any problems should occur.  Please show the CHEMOTHERAPY ALERT CARD or IMMUNOTHERAPY ALERT CARD at check-in to the Emergency Department and triage nurse.  Should you have questions after your visit or need to cancel or reschedule your appointment, please contact [facility] - A DEPT OF [facility]  Dept: [phone]  and follow the prompts.  Office hours are 8:00 a.m. to 4:30 p.m. Monday - Friday. Please note that  voicemails left after 4:00 p.m. may not be returned until the following business day.  We are closed weekends and major holidays. You have access to a nurse at all times for urgent questions. Please call the main number to the clinic Dept: [phone] and follow the prompts.   For any non-urgent questions, you may also contact your provider using MyChart. We now offer e-Visits for anyone 60 and older to request care online for non-urgent symptoms. For details visit [email or web address].   Also download the MyChart app! Go to the app store, search MyChart, open the app, select [place], and log in with your MyChart username and password.  Influenza, Adult Influenza is also called the flu. It's an infection that affects your respiratory tract. This includes your nose, throat, windpipe, and lungs. The flu is contagious. This means it spreads easily from person to person. It causes symptoms that are like a cold. It can also cause a high fever and body aches. What are the causes? The flu is caused by the influenza virus. You can get it by: Breathing in droplets that are in the air after an infected person coughs or sneezes. Touching something that has the virus on it and then touching your mouth, nose, or eyes. What increases the risk? You may be more likely to get the flu if: You don't wash your hands  often. You're near a lot of people during cold and flu season. You touch your mouth, eyes, or nose without washing your hands first. You don't get a flu shot each year. You may also be more at risk for the flu and serious problems, such as a lung infection called pneumonia, if: You're older than 65. You're pregnant. Your immune system is weak. Your immune system is your body's defense system. You have a long-term, or chronic, condition, such as: Heart, kidney, or lung disease. Diabetes. A liver disorder. Asthma. You're very overweight. You have anemia. This is when you don't have enough red blood cells in your body. What are the signs or symptoms? Flu symptoms often start all of a sudden. They may last 4-14 days and include: Fever and chills. Headaches, body aches, or muscle aches. Sore throat. Cough. Runny or stuffy nose. Discomfort in your chest. Not wanting to eat as much as normal. Feeling weak or tired. Feeling dizzy. Nausea or vomiting. How is this diagnosed? The flu may be diagnosed based on your symptoms and medical history. You may also have a physical exam. A swab may be taken from your nose or throat and tested for the virus. How is this treated? If the flu is found early, you can be treated with antiviral medicine. This may be given to you by mouth or through an IV. It can help you feel less sick and get better faster. Taking care of yourself at home can also help your symptoms get better. Your health care provider may tell you to: Take over-the-counter medicines. Drink lots of fluids. The flu often goes away on its own. If you have very bad symptoms or problems caused by the flu, you may need to be treated in a hospital. Follow these instructions at home: Activity Rest as needed. Get lots of sleep. Stay home from work or school as told by your provider. Leave home only to go see your provider. Do not leave home for other reasons until you don't have a fever for 24  hours without taking medicine. Eating and drinking Take an oral rehydration solution (ORS). This  is a drink that is sold at pharmacies and stores. Drink enough fluid to keep your pee pale yellow. Try to drink small amounts of clear fluids. These include water, ice chips, fruit juice mixed with water, and low-calorie sports drinks. Try to eat bland foods that are easy to digest. These include bananas, applesauce, rice, lean meats, toast, and crackers. Avoid drinks that have a lot of sugar or caffeine in them. These include energy drinks, regular sports drinks, and soda. Do not drink alcohol. Do not eat spicy or fatty foods. General instructions     Take your medicines only as told by your provider. Use a cool mist humidifier to add moisture to the air in your home. This can make it easier for you to breathe. You should also clean the humidifier every day. To do so: Empty the water. Pour clean water in. Cover your mouth and nose when you cough or sneeze. Wash your hands with soap and water often and for at least 20 seconds. It's extra important to do so after you cough or sneeze. If you can't use soap and water, use hand sanitizer. How is this prevented?  Get a flu shot every year. Ask your provider when you should get your flu shot. Stay away from people who are sick during fall and winter. Fall and winter are cold and flu season. Contact a health care provider if: You get new symptoms. You have chest pain. You have watery poop, also called diarrhea. You have a fever. Your cough gets worse. You start to have more mucus. You feel like you may vomit, or you vomit. Get help right away if: You become short of breath or have trouble breathing. Your skin or nails turn blue. You have very bad pain or stiffness in your neck. You get a sudden headache or pain in your face or ear. You vomit each time you eat or drink. These symptoms may be an emergency. Call 911 right away. Do not wait to  see if the symptoms will go away. Do not drive yourself to the hospital. This information is not intended to replace advice given to you by your health care provider. Make sure you discuss any questions you have with your health care provider. Document Revised: 10/24/2022 Document Reviewed: 02/28/2022 Elsevier Patient Education  2024 [employer].

## 2023-12-23 NOTE — Assessment & Plan Note (Addendum)
 He had recent pacemaker implanted without difficulties He has no signs or symptoms of congestive heart failure and we will continue medical management

## 2023-12-24 LAB — T4: T4, Total: 11.6 ug/dL (ref 4.5–12.0)

## 2023-12-25 ENCOUNTER — Ambulatory Visit: Attending: Cardiovascular Disease | Admitting: *Deleted

## 2023-12-25 DIAGNOSIS — I472 Ventricular tachycardia, unspecified: Secondary | ICD-10-CM

## 2023-12-25 DIAGNOSIS — I5022 Chronic systolic (congestive) heart failure: Secondary | ICD-10-CM | POA: Diagnosis not present

## 2023-12-25 LAB — CUP PACEART INCLINIC DEVICE CHECK
Battery Remaining Longevity: 111 mo
Brady Statistic RA Percent Paced: 16 %
Brady Statistic RV Percent Paced: 15 %
Date Time Interrogation Session: 20251120173456
HighPow Impedance: 69.75 Ohm
Implantable Lead Connection Status: 753985
Implantable Lead Connection Status: 753985
Implantable Lead Implant Date: 20120420
Implantable Lead Implant Date: 20180613
Implantable Lead Location: 753859
Implantable Lead Location: 753860
Implantable Pulse Generator Implant Date: 20251110
Lead Channel Impedance Value: 350 Ohm
Lead Channel Impedance Value: 575 Ohm
Lead Channel Pacing Threshold Amplitude: 0.75 V
Lead Channel Pacing Threshold Amplitude: 0.75 V
Lead Channel Pacing Threshold Amplitude: 1.25 V
Lead Channel Pacing Threshold Amplitude: 1.25 V
Lead Channel Pacing Threshold Pulse Width: 0.5 ms
Lead Channel Pacing Threshold Pulse Width: 0.5 ms
Lead Channel Pacing Threshold Pulse Width: 0.5 ms
Lead Channel Pacing Threshold Pulse Width: 0.5 ms
Lead Channel Sensing Intrinsic Amplitude: 1.6 mV
Lead Channel Sensing Intrinsic Amplitude: 12 mV
Lead Channel Setting Pacing Amplitude: 2 V
Lead Channel Setting Pacing Amplitude: 2.5 V
Lead Channel Setting Pacing Pulse Width: 0.5 ms
Lead Channel Setting Sensing Sensitivity: 0.5 mV
Pulse Gen Serial Number: 211051218

## 2023-12-25 NOTE — Progress Notes (Signed)
 Normal dual chamber ICD wound check. Wound well healed. Presenting rhythm: AS/VS. Routine testing performed. Thresholds, sensing, and impedance consistent with previous measurements with previous safety margins settings. No treated arrhythmias. Several false AT/AF episodes noted due to known noise on atrial lead. No arm or lifting restrictions as this was only a generator change out. Reviewed shock plan.  Pt enrolled in remote follow-up.

## 2023-12-25 NOTE — Patient Instructions (Signed)

## 2023-12-29 ENCOUNTER — Encounter

## 2024-01-02 ENCOUNTER — Other Ambulatory Visit: Payer: Self-pay | Admitting: Cardiology

## 2024-01-05 ENCOUNTER — Ambulatory Visit: Payer: Self-pay | Admitting: Cardiovascular Disease

## 2024-01-08 ENCOUNTER — Encounter

## 2024-01-12 ENCOUNTER — Telehealth: Payer: Self-pay | Admitting: Pharmacy Technician

## 2024-01-12 ENCOUNTER — Other Ambulatory Visit (HOSPITAL_COMMUNITY): Payer: Self-pay

## 2024-01-12 ENCOUNTER — Ambulatory Visit (HOSPITAL_COMMUNITY): Admission: RE | Admit: 2024-01-12 | Discharge: 2024-01-12 | Attending: Hematology and Oncology

## 2024-01-12 ENCOUNTER — Telehealth: Payer: Self-pay | Admitting: Cardiology

## 2024-01-12 ENCOUNTER — Encounter: Payer: Self-pay | Admitting: Hematology and Oncology

## 2024-01-12 DIAGNOSIS — C3491 Malignant neoplasm of unspecified part of right bronchus or lung: Secondary | ICD-10-CM

## 2024-01-12 LAB — GLUCOSE, CAPILLARY: Glucose-Capillary: 96 mg/dL (ref 70–99)

## 2024-01-12 MED ORDER — FLUDEOXYGLUCOSE F - 18 (FDG) INJECTION
6.9000 | Freq: Once | INTRAVENOUS | Status: AC
  Administered 2024-01-12: 6.51 via INTRAVENOUS

## 2024-01-12 MED ORDER — EMPAGLIFLOZIN 10 MG PO TABS: 10.0000 mg | ORAL_TABLET | Freq: Every day | ORAL | 3 refills | Status: AC

## 2024-01-12 NOTE — Telephone Encounter (Signed)
 Refill Jardiance  sent to [facility].

## 2024-01-12 NOTE — Telephone Encounter (Signed)
   Patient Advocate Encounter   The patient was approved for a [employer] grant that will help cover the cost of XARELTO /JARDIANCE  Total amount awarded, 7500.  Effective: 12/13/23 - 12/11/24   APW:[id number] ERW:[id number] Hmnle:[id number] PI:[id number] [employer] ID: [id number]   Pharmacy provided with approval and processing information. Patient informed via TELEPHONE   Xarelto  now free. [facility] needed [facility] jardiance  rx. Message sent. I called the wife to make her aware

## 2024-01-12 NOTE — Telephone Encounter (Signed)
 Pt c/o medication issue:  1. Name of Medication: empagliflozin  (JARDIANCE ) 10 MG TABS tablet  Rivaroxaban  (XARELTO ) 15 MG TABS tablet   2. How are you currently taking this medication (dosage and times per day)? As written   3. Are you having a reaction (difficulty breathing--STAT)? no  4. What is your medication issue? Pt spouse called in stating these meds are around $200. She asked if they can get some assistance with cost. Please advise.

## 2024-01-20 ENCOUNTER — Inpatient Hospital Stay: Attending: Hematology and Oncology | Admitting: Hematology and Oncology

## 2024-01-20 ENCOUNTER — Inpatient Hospital Stay

## 2024-01-20 ENCOUNTER — Inpatient Hospital Stay: Attending: Hematology and Oncology

## 2024-01-20 ENCOUNTER — Encounter: Payer: Self-pay | Admitting: Hematology and Oncology

## 2024-01-20 VITALS — BP 114/60 | HR 75 | Resp 18

## 2024-01-20 VITALS — BP 139/66 | HR 74 | Temp 97.5°F | Resp 18 | Ht 70.0 in | Wt 132.8 lb

## 2024-01-20 DIAGNOSIS — C3491 Malignant neoplasm of unspecified part of right bronchus or lung: Secondary | ICD-10-CM | POA: Insufficient documentation

## 2024-01-20 DIAGNOSIS — R9389 Abnormal findings on diagnostic imaging of other specified body structures: Secondary | ICD-10-CM | POA: Diagnosis not present

## 2024-01-20 DIAGNOSIS — Z5112 Encounter for antineoplastic immunotherapy: Secondary | ICD-10-CM | POA: Diagnosis present

## 2024-01-20 DIAGNOSIS — E278 Other specified disorders of adrenal gland: Secondary | ICD-10-CM | POA: Insufficient documentation

## 2024-01-20 DIAGNOSIS — M545 Low back pain, unspecified: Secondary | ICD-10-CM | POA: Diagnosis not present

## 2024-01-20 DIAGNOSIS — Z79899 Other long term (current) drug therapy: Secondary | ICD-10-CM | POA: Diagnosis not present

## 2024-01-20 DIAGNOSIS — G8929 Other chronic pain: Secondary | ICD-10-CM

## 2024-01-20 LAB — CBC WITH DIFFERENTIAL (CANCER CENTER ONLY)
Abs Immature Granulocytes: 0.02 K/uL (ref 0.00–0.07)
Basophils Absolute: 0 K/uL (ref 0.0–0.1)
Basophils Relative: 1 %
Eosinophils Absolute: 0.2 K/uL (ref 0.0–0.5)
Eosinophils Relative: 4 %
HCT: 39.3 % (ref 39.0–52.0)
Hemoglobin: 13.5 g/dL (ref 13.0–17.0)
Immature Granulocytes: 0 %
Lymphocytes Relative: 17 %
Lymphs Abs: 0.9 K/uL (ref 0.7–4.0)
MCH: 31 pg (ref 26.0–34.0)
MCHC: 34.4 g/dL (ref 30.0–36.0)
MCV: 90.3 fL (ref 80.0–100.0)
Monocytes Absolute: 0.6 K/uL (ref 0.1–1.0)
Monocytes Relative: 11 %
Neutro Abs: 3.7 K/uL (ref 1.7–7.7)
Neutrophils Relative %: 67 %
Platelet Count: 167 K/uL (ref 150–400)
RBC: 4.35 MIL/uL (ref 4.22–5.81)
RDW: 15.9 % — ABNORMAL HIGH (ref 11.5–15.5)
WBC Count: 5.6 K/uL (ref 4.0–10.5)
nRBC: 0 % (ref 0.0–0.2)

## 2024-01-20 LAB — CMP (CANCER CENTER ONLY)
ALT: 25 U/L (ref 0–44)
AST: 31 U/L (ref 15–41)
Albumin: 3.9 g/dL (ref 3.5–5.0)
Alkaline Phosphatase: 77 U/L (ref 38–126)
Anion gap: 10 (ref 5–15)
BUN: 11 mg/dL (ref 8–23)
CO2: 24 mmol/L (ref 22–32)
Calcium: 8.8 mg/dL — ABNORMAL LOW (ref 8.9–10.3)
Chloride: 104 mmol/L (ref 98–111)
Creatinine: 1.03 mg/dL (ref 0.61–1.24)
GFR, Estimated: >60 mL/min (ref >60)
Glucose, Bld: 107 mg/dL — ABNORMAL HIGH (ref 70–99)
Potassium: 3.6 mmol/L (ref 3.5–5.1)
Sodium: 138 mmol/L (ref 135–145)
Total Bilirubin: 1.1 mg/dL (ref 0.0–1.2)
Total Protein: 7.4 g/dL (ref 6.5–8.1)

## 2024-01-20 LAB — TSH: TSH: 4.22 u[IU]/mL (ref 0.350–4.500)

## 2024-01-20 MED ORDER — OXYCODONE HCL 5 MG PO TABS: 5.0000 mg | ORAL_TABLET | Freq: Four times a day (QID) | ORAL | 0 refills | Status: AC | PRN

## 2024-01-20 MED ORDER — SODIUM CHLORIDE 0.9 % IV SOLN
1500.0000 mg | Freq: Once | INTRAVENOUS | Status: AC
  Administered 2024-01-20: 13:00:00 1500 mg via INTRAVENOUS
  Filled 2024-01-20: qty 30

## 2024-01-20 MED ORDER — SODIUM CHLORIDE 0.9 % IV SOLN: Freq: Once | INTRAVENOUS | Status: AC

## 2024-01-20 NOTE — Assessment & Plan Note (Addendum)
 He has adrenal nodule that is stable on exam Incidentally, he has abnormal imaging in the sigmoid colon region Colonoscopy is recommended and I will refer him back to GI for colonoscopy evaluation to rule out malignancy

## 2024-01-20 NOTE — Progress Notes (Signed)
 [facility] OFFICE PROGRESS NOTE  Patient Care Team: Maree Leni Edyth DELENA, MD as PCP - General (Family Medicine) Pietro Redell RAMAN, MD as PCP - Cardiology (Cardiology) Mealor, Eulas BRAVO, [name]  Assessment & Plan Non-small cell cancer of right lung [name]) He was diagnosed with stage III non-small cell lung cancer in May 2024 Pathology: Non-small cell lung cancer, limited material for subtype, Foundation One test showed no actionable mutations. 6TMB, MSI low  It was unresectable at the time of diagnosis He received concurrent chemotherapy with combination of carboplatin  and paclitaxel  along with radiation treatment, followed by maintenance durvalumab  since August 2024  PET/CT imaging in December 2025 showed no definitive evidence of cancer recurrence His lungs still look abnormal from postradiation changes, I plan to monitor every 6 months, next due in June 2026 He tolerated durvalumab  well, without major side effects We will proceed without delay  Chronic midline low back pain without sciatica He has chronic lower back pain for a long time He has been taking intermittent doses of oxycodone  and Tylenol  as needed I recommend he continues the same He has no major side effects from these medications He will continue the oxycodone  I prescribed, refill today Adrenal mass He has adrenal nodule that is stable on exam Incidentally, he has abnormal imaging in the sigmoid colon region Colonoscopy is recommended and I will refer him back to GI for colonoscopy evaluation to rule out malignancy  Orders Placed This Encounter  Procedures   CBC with Differential (Cancer Center Only)    Standing Status:   Future    Expected Date:   03/16/2024    Expiration Date:   03/16/2025   CMP (Cancer Center only)    Standing Status:   Future    Expected Date:   03/16/2024    Expiration Date:   03/16/2025   T4    Standing Status:   Future    Expected Date:    03/16/2024    Expiration Date:   03/16/2025   TSH    Standing Status:   Future    Expected Date:   03/16/2024    Expiration Date:   03/16/2025   Ambulatory referral to Gastroenterology    Referral Priority:   Routine    Referral Type:   Consultation    Referral Reason:   Specialty Services Required    Number of Visits Requested:   1     [name], MD  INTERVAL HISTORY: he returns for treatment follow-up Complications related to previous cycle of chemotherapy included bone aches,  PHYSICAL EXAMINATION: ECOG PERFORMANCE STATUS: 1 - Symptomatic but completely ambulatory  No results found for: CAN125    Latest Ref Rng & Units 01/20/2024   10:43 AM 12/23/2023    9:56 AM 11/25/2023   10:00 AM  CBC  WBC 4.0 - 10.5 K/uL 5.6  6.1  7.2   Hemoglobin 13.0 - 17.0 g/dL 86.4  86.4  85.9   Hematocrit 39.0 - 52.0 % 39.3  40.1  41.9   Platelets 150 - 400 K/uL 167  162  176       Chemistry      Component Value Date/Time   NA 138 01/20/2024 1043   NA 141 11/18/2023 1402   K 3.6 01/20/2024 1043   CL 104 01/20/2024 1043   CO2 24 01/20/2024 1043   BUN 11 01/20/2024 1043   BUN 13 11/18/2023 1402   CREATININE 1.03 01/20/2024 1043   CREATININE  0.88 09/15/2015 0959      Component Value Date/Time   CALCIUM  8.8 (L) 01/20/2024 1043   ALKPHOS 77 01/20/2024 1043   AST 31 01/20/2024 1043   ALT 25 01/20/2024 1043   BILITOT 1.1 01/20/2024 1043       Vitals:   01/20/24 1102  BP: 139/66  Pulse: 74  Resp: 18  Temp: (!) 97.5 F (36.4 C)  SpO2: 98%   Filed Weights   01/20/24 1102  Weight: 132 lb 12.8 oz (60.2 kg)   Other relevant data reviewed during this visit included CBC, CMP, PET/CT imaging

## 2024-01-20 NOTE — Assessment & Plan Note (Addendum)
 He was diagnosed with stage III non-small cell lung cancer in May 2024 Pathology: Non-small cell lung cancer, limited material for subtype, Foundation One test showed no actionable mutations. 6TMB, MSI low  It was unresectable at the time of diagnosis He received concurrent chemotherapy with combination of carboplatin  and paclitaxel  along with radiation treatment, followed by maintenance durvalumab  since August 2024  PET/CT imaging in December 2025 showed no definitive evidence of cancer recurrence His lungs still look abnormal from postradiation changes, I plan to monitor every 6 months, next due in June 2026 He tolerated durvalumab  well, without major side effects We will proceed without delay

## 2024-01-20 NOTE — Patient Instructions (Signed)
 [facility] - A DEPT OF [facility]  Discharge Instructions: You received your influenza shot today. Thank you for choosing [facility] to provide your oncology and hematology care.   If you have a lab appointment with the Cancer Center, please go directly to the Cancer Center and check in at the registration area.   Wear comfortable clothing and clothing appropriate for easy access to any Portacath or PICC line.   We strive to give you quality time with your provider. You may need to reschedule your appointment if you arrive late (15 or more minutes).  Arriving late affects you and other patients whose appointments are after yours.  Also, if you miss three or more appointments without notifying the office, you may be dismissed from the clinic at the provider's discretion.      For prescription refill requests, have your pharmacy contact our office and allow 72 hours for refills to be completed.    Today you received the following chemotherapy and/or immunotherapy agents: Durvalumab  (Imfinzi )    To help prevent nausea and vomiting after your treatment, we encourage you to take your nausea medication as directed.  BELOW ARE SYMPTOMS THAT SHOULD BE REPORTED IMMEDIATELY: *FEVER GREATER THAN 100.4 F (38 C) OR HIGHER *CHILLS OR SWEATING *NAUSEA AND VOMITING THAT IS NOT CONTROLLED WITH YOUR NAUSEA MEDICATION *UNUSUAL SHORTNESS OF BREATH *UNUSUAL BRUISING OR BLEEDING *URINARY PROBLEMS (pain or burning when urinating, or frequent urination) *BOWEL PROBLEMS (unusual diarrhea, constipation, pain near the anus) TENDERNESS IN MOUTH AND THROAT WITH OR WITHOUT PRESENCE OF ULCERS (sore throat, sores in mouth, or a toothache) UNUSUAL RASH, SWELLING OR PAIN  UNUSUAL VAGINAL DISCHARGE OR ITCHING   Items with * indicate a potential emergency and should be followed up as soon as possible or go to the Emergency Department if any problems should occur.  Please  show the CHEMOTHERAPY ALERT CARD or IMMUNOTHERAPY ALERT CARD at check-in to the Emergency Department and triage nurse.  Should you have questions after your visit or need to cancel or reschedule your appointment, please contact [facility] - A DEPT OF [facility]  Dept: [phone]  and follow the prompts.  Office hours are 8:00 a.m. to 4:30 p.m. Monday - Friday. Please note that voicemails left after 4:00 p.m. may not be returned until the following business day.  We are closed weekends and major holidays. You have access to a nurse at all times for urgent questions. Please call the main number to the clinic Dept: [phone] and follow the prompts.   For any non-urgent questions, you may also contact your provider using MyChart. We now offer e-Visits for anyone 65 and older to request care online for non-urgent symptoms. For details visit [email or web address].   Also download the MyChart app! Go to the app store, search MyChart, open the app, select [place], and log in with your MyChart username and password.  Influenza, Adult Influenza is also called the flu. It's an infection that affects your respiratory tract. This includes your nose, throat, windpipe, and lungs. The flu is contagious. This means it spreads easily from person to person. It causes symptoms that are like a cold. It can also cause a high fever and body aches. What are the causes? The flu is caused by the influenza virus. You can get it by: Breathing in droplets that are in the air after an infected person coughs or sneezes.  Touching something that has the virus on it and then touching your mouth, nose, or eyes. What increases the risk? You may be more likely to get the flu if: You don't wash your hands often. You're near a lot of people during cold and flu season. You touch your mouth, eyes, or nose without washing your hands first. You don't get a flu shot each year. You may also  be more at risk for the flu and serious problems, such as a lung infection called pneumonia, if: You're older than 65. You're pregnant. Your immune system is weak. Your immune system is your body's defense system. You have a long-term, or chronic, condition, such as: Heart, kidney, or lung disease. Diabetes. A liver disorder. Asthma. You're very overweight. You have anemia. This is when you don't have enough red blood cells in your body. What are the signs or symptoms? Flu symptoms often start all of a sudden. They may last 4-14 days and include: Fever and chills. Headaches, body aches, or muscle aches. Sore throat. Cough. Runny or stuffy nose. Discomfort in your chest. Not wanting to eat as much as normal. Feeling weak or tired. Feeling dizzy. Nausea or vomiting. How is this diagnosed? The flu may be diagnosed based on your symptoms and medical history. You may also have a physical exam. A swab may be taken from your nose or throat and tested for the virus. How is this treated? If the flu is found early, you can be treated with antiviral medicine. This may be given to you by mouth or through an IV. It can help you feel less sick and get better faster. Taking care of yourself at home can also help your symptoms get better. Your health care provider may tell you to: Take over-the-counter medicines. Drink lots of fluids. The flu often goes away on its own. If you have very bad symptoms or problems caused by the flu, you may need to be treated in a hospital. Follow these instructions at home: Activity Rest as needed. Get lots of sleep. Stay home from work or school as told by your provider. Leave home only to go see your provider. Do not leave home for other reasons until you don't have a fever for 24 hours without taking medicine. Eating and drinking Take an oral rehydration solution (ORS). This is a drink that is sold at pharmacies and stores. Drink enough fluid to keep your pee  pale yellow. Try to drink small amounts of clear fluids. These include water, ice chips, fruit juice mixed with water, and low-calorie sports drinks. Try to eat bland foods that are easy to digest. These include bananas, applesauce, rice, lean meats, toast, and crackers. Avoid drinks that have a lot of sugar or caffeine in them. These include energy drinks, regular sports drinks, and soda. Do not drink alcohol. Do not eat spicy or fatty foods. General instructions     Take your medicines only as told by your provider. Use a cool mist humidifier to add moisture to the air in your home. This can make it easier for you to breathe. You should also clean the humidifier every day. To do so: Empty the water. Pour clean water in. Cover your mouth and nose when you cough or sneeze. Wash your hands with soap and water often and for at least 20 seconds. It's extra important to do so after you cough or sneeze. If you can't use soap and water, use hand sanitizer. How is this prevented?  Get a flu shot every year. Ask your provider when you should get your flu shot. Stay away from people who are sick during fall and winter. Fall and winter are cold and flu season. Contact a health care provider if: You get new symptoms. You have chest pain. You have watery poop, also called diarrhea. You have a fever. Your cough gets worse. You start to have more mucus. You feel like you may vomit, or you vomit. Get help right away if: You become short of breath or have trouble breathing. Your skin or nails turn blue. You have very bad pain or stiffness in your neck. You get a sudden headache or pain in your face or ear. You vomit each time you eat or drink. These symptoms may be an emergency. Call 911 right away. Do not wait to see if the symptoms will go away. Do not drive yourself to the hospital. This information is not intended to replace advice given to you by your health care provider. Make sure you  discuss any questions you have with your health care provider. Document Revised: 10/24/2022 Document Reviewed: 02/28/2022 Elsevier Patient Education  2024 [employer] - A DEPT OF [facility]  Discharge Instructions: You received your influenza shot today. Thank you for choosing [facility] to provide your oncology and hematology care.   If you have a lab appointment with the Cancer Center, please go directly to the Cancer Center and check in at the registration area.   Wear comfortable clothing and clothing appropriate for easy access to any Portacath or PICC line.   We strive to give you quality time with your provider. You may need to reschedule your appointment if you arrive late (15 or more minutes).  Arriving late affects you and other patients whose appointments are after yours.  Also, if you miss three or more appointments without notifying the office, you may be dismissed from the clinic at the provider's discretion.      For prescription refill requests, have your pharmacy contact our office and allow 72 hours for refills to be completed.    Today you received the following chemotherapy and/or immunotherapy agents: Durvalumab  (Imfinzi )    To help prevent nausea and vomiting after your treatment, we encourage you to take your nausea medication as directed.  BELOW ARE SYMPTOMS THAT SHOULD BE REPORTED IMMEDIATELY: *FEVER GREATER THAN 100.4 F (38 C) OR HIGHER *CHILLS OR SWEATING *NAUSEA AND VOMITING THAT IS NOT CONTROLLED WITH YOUR NAUSEA MEDICATION *UNUSUAL SHORTNESS OF BREATH *UNUSUAL BRUISING OR BLEEDING *URINARY PROBLEMS (pain or burning when urinating, or frequent urination) *BOWEL PROBLEMS (unusual diarrhea, constipation, pain near the anus) TENDERNESS IN MOUTH AND THROAT WITH OR WITHOUT PRESENCE OF ULCERS (sore throat, sores in mouth, or a toothache) UNUSUAL RASH, SWELLING OR PAIN  UNUSUAL VAGINAL DISCHARGE OR  ITCHING   Items with * indicate a potential emergency and should be followed up as soon as possible or go to the Emergency Department if any problems should occur.  Please show the CHEMOTHERAPY ALERT CARD or IMMUNOTHERAPY ALERT CARD at check-in to the Emergency Department and triage nurse.  Should you have questions after your visit or need to cancel or reschedule your appointment, please contact [facility] DEPT [facility]  Dept: [phone]  and follow the prompts.  Office hours are 8:00 a.m. to 4:30 p.m. Monday - Friday. Please note that  voicemails left after 4:00 p.m. may not be returned until the following business day.  We are closed weekends and major holidays. You have access to a nurse at all times for urgent questions. Please call the main number to the clinic Dept: [phone] and follow the prompts.   For any non-urgent questions, you may also contact your provider using MyChart. We now offer e-Visits for anyone 60 and older to request care online for non-urgent symptoms. For details visit [email or web address].   Also download the MyChart app! Go to the app store, search MyChart, open the app, select [place], and log in with your MyChart username and password.  Influenza, Adult Influenza is also called the flu. It's an infection that affects your respiratory tract. This includes your nose, throat, windpipe, and lungs. The flu is contagious. This means it spreads easily from person to person. It causes symptoms that are like a cold. It can also cause a high fever and body aches. What are the causes? The flu is caused by the influenza virus. You can get it by: Breathing in droplets that are in the air after an infected person coughs or sneezes. Touching something that has the virus on it and then touching your mouth, nose, or eyes. What increases the risk? You may be more likely to get the flu if: You don't wash your hands  often. You're near a lot of people during cold and flu season. You touch your mouth, eyes, or nose without washing your hands first. You don't get a flu shot each year. You may also be more at risk for the flu and serious problems, such as a lung infection called pneumonia, if: You're older than 65. You're pregnant. Your immune system is weak. Your immune system is your body's defense system. You have a long-term, or chronic, condition, such as: Heart, kidney, or lung disease. Diabetes. A liver disorder. Asthma. You're very overweight. You have anemia. This is when you don't have enough red blood cells in your body. What are the signs or symptoms? Flu symptoms often start all of a sudden. They may last 4-14 days and include: Fever and chills. Headaches, body aches, or muscle aches. Sore throat. Cough. Runny or stuffy nose. Discomfort in your chest. Not wanting to eat as much as normal. Feeling weak or tired. Feeling dizzy. Nausea or vomiting. How is this diagnosed? The flu may be diagnosed based on your symptoms and medical history. You may also have a physical exam. A swab may be taken from your nose or throat and tested for the virus. How is this treated? If the flu is found early, you can be treated with antiviral medicine. This may be given to you by mouth or through an IV. It can help you feel less sick and get better faster. Taking care of yourself at home can also help your symptoms get better. Your health care provider may tell you to: Take over-the-counter medicines. Drink lots of fluids. The flu often goes away on its own. If you have very bad symptoms or problems caused by the flu, you may need to be treated in a hospital. Follow these instructions at home: Activity Rest as needed. Get lots of sleep. Stay home from work or school as told by your provider. Leave home only to go see your provider. Do not leave home for other reasons until you don't have a fever for 24  hours without taking medicine. Eating and drinking Take an oral rehydration solution (ORS). This  is a drink that is sold at pharmacies and stores. Drink enough fluid to keep your pee pale yellow. Try to drink small amounts of clear fluids. These include water, ice chips, fruit juice mixed with water, and low-calorie sports drinks. Try to eat bland foods that are easy to digest. These include bananas, applesauce, rice, lean meats, toast, and crackers. Avoid drinks that have a lot of sugar or caffeine in them. These include energy drinks, regular sports drinks, and soda. Do not drink alcohol. Do not eat spicy or fatty foods. General instructions     Take your medicines only as told by your provider. Use a cool mist humidifier to add moisture to the air in your home. This can make it easier for you to breathe. You should also clean the humidifier every day. To do so: Empty the water. Pour clean water in. Cover your mouth and nose when you cough or sneeze. Wash your hands with soap and water often and for at least 20 seconds. It's extra important to do so after you cough or sneeze. If you can't use soap and water, use hand sanitizer. How is this prevented?  Get a flu shot every year. Ask your provider when you should get your flu shot. Stay away from people who are sick during fall and winter. Fall and winter are cold and flu season. Contact a health care provider if: You get new symptoms. You have chest pain. You have watery poop, also called diarrhea. You have a fever. Your cough gets worse. You start to have more mucus. You feel like you may vomit, or you vomit. Get help right away if: You become short of breath or have trouble breathing. Your skin or nails turn blue. You have very bad pain or stiffness in your neck. You get a sudden headache or pain in your face or ear. You vomit each time you eat or drink. These symptoms may be an emergency. Call 911 right away. Do not wait to  see if the symptoms will go away. Do not drive yourself to the hospital. This information is not intended to replace advice given to you by your health care provider. Make sure you discuss any questions you have with your health care provider. Document Revised: 10/24/2022 Document Reviewed: 02/28/2022 Elsevier Patient Education  2024 [employer].

## 2024-01-20 NOTE — Assessment & Plan Note (Addendum)
 He has chronic lower back pain for a long time He has been taking intermittent doses of oxycodone  and Tylenol  as needed I recommend he continues the same He has no major side effects from these medications He will continue the oxycodone  I prescribed, refill today

## 2024-01-21 LAB — T4: T4, Total: 11.8 ug/dL (ref 4.5–12.0)

## 2024-01-29 ENCOUNTER — Encounter

## 2024-02-04 ENCOUNTER — Ambulatory Visit

## 2024-02-04 DIAGNOSIS — I480 Paroxysmal atrial fibrillation: Secondary | ICD-10-CM | POA: Diagnosis not present

## 2024-02-05 LAB — CUP PACEART REMOTE DEVICE CHECK
Battery Remaining Longevity: 100 mo
Battery Remaining Percentage: 95.5 %
Battery Voltage: 3.07 V
Brady Statistic AP VP Percent: 16 %
Brady Statistic AP VS Percent: 4.2 %
Brady Statistic AS VP Percent: 3.6 %
Brady Statistic AS VS Percent: 76 %
Brady Statistic RA Percent Paced: 20 %
Brady Statistic RV Percent Paced: 20 %
Date Time Interrogation Session: 20251231020323
HighPow Impedance: 63 Ohm
Implantable Lead Connection Status: 753985
Implantable Lead Connection Status: 753985
Implantable Lead Implant Date: 20120420
Implantable Lead Implant Date: 20180613
Implantable Lead Location: 753859
Implantable Lead Location: 753860
Implantable Pulse Generator Implant Date: 20251110
Lead Channel Impedance Value: 330 Ohm
Lead Channel Impedance Value: 500 Ohm
Lead Channel Pacing Threshold Amplitude: 0.75 V
Lead Channel Pacing Threshold Amplitude: 1.25 V
Lead Channel Pacing Threshold Pulse Width: 0.5 ms
Lead Channel Pacing Threshold Pulse Width: 0.5 ms
Lead Channel Sensing Intrinsic Amplitude: 11.8 mV
Lead Channel Sensing Intrinsic Amplitude: 2.5 mV
Lead Channel Setting Pacing Amplitude: 2 V
Lead Channel Setting Pacing Amplitude: 2.5 V
Lead Channel Setting Pacing Pulse Width: 0.5 ms
Lead Channel Setting Sensing Sensitivity: 0.5 mV
Pulse Gen Serial Number: 211051218

## 2024-02-09 ENCOUNTER — Encounter

## 2024-02-09 NOTE — Progress Notes (Signed)
 Remote ICD Transmission

## 2024-02-14 ENCOUNTER — Ambulatory Visit: Payer: Self-pay | Admitting: Cardiovascular Disease

## 2024-02-17 ENCOUNTER — Inpatient Hospital Stay

## 2024-02-17 ENCOUNTER — Encounter: Payer: Self-pay | Admitting: Hematology and Oncology

## 2024-02-17 ENCOUNTER — Inpatient Hospital Stay (HOSPITAL_BASED_OUTPATIENT_CLINIC_OR_DEPARTMENT_OTHER): Admitting: Hematology and Oncology

## 2024-02-17 ENCOUNTER — Inpatient Hospital Stay: Attending: Hematology and Oncology

## 2024-02-17 VITALS — BP 117/61 | HR 79 | Temp 97.7°F | Resp 18 | Ht 70.0 in | Wt 135.2 lb

## 2024-02-17 DIAGNOSIS — C3491 Malignant neoplasm of unspecified part of right bronchus or lung: Secondary | ICD-10-CM

## 2024-02-17 DIAGNOSIS — Z5112 Encounter for antineoplastic immunotherapy: Secondary | ICD-10-CM | POA: Diagnosis present

## 2024-02-17 DIAGNOSIS — Z79899 Other long term (current) drug therapy: Secondary | ICD-10-CM | POA: Diagnosis not present

## 2024-02-17 LAB — CMP (CANCER CENTER ONLY)
ALT: 26 U/L (ref 0–44)
AST: 34 U/L (ref 15–41)
Albumin: 4 g/dL (ref 3.5–5.0)
Alkaline Phosphatase: 76 U/L (ref 38–126)
Anion gap: 11 (ref 5–15)
BUN: 14 mg/dL (ref 8–23)
CO2: 25 mmol/L (ref 22–32)
Calcium: 9 mg/dL (ref 8.9–10.3)
Chloride: 104 mmol/L (ref 98–111)
Creatinine: 1.12 mg/dL (ref 0.61–1.24)
GFR, Estimated: >60 mL/min
Glucose, Bld: 146 mg/dL — ABNORMAL HIGH (ref 70–99)
Potassium: 3.5 mmol/L (ref 3.5–5.1)
Sodium: 140 mmol/L (ref 135–145)
Total Bilirubin: 0.9 mg/dL (ref 0.0–1.2)
Total Protein: 7.5 g/dL (ref 6.5–8.1)

## 2024-02-17 LAB — CBC WITH DIFFERENTIAL (CANCER CENTER ONLY)
Abs Immature Granulocytes: 0.02 K/uL (ref 0.00–0.07)
Basophils Absolute: 0 K/uL (ref 0.0–0.1)
Basophils Relative: 1 %
Eosinophils Absolute: 0.2 K/uL (ref 0.0–0.5)
Eosinophils Relative: 3 %
HCT: 40.9 % (ref 39.0–52.0)
Hemoglobin: 13.7 g/dL (ref 13.0–17.0)
Immature Granulocytes: 0 %
Lymphocytes Relative: 12 %
Lymphs Abs: 0.7 K/uL (ref 0.7–4.0)
MCH: 31.1 pg (ref 26.0–34.0)
MCHC: 33.5 g/dL (ref 30.0–36.0)
MCV: 93 fL (ref 80.0–100.0)
Monocytes Absolute: 0.6 K/uL (ref 0.1–1.0)
Monocytes Relative: 10 %
Neutro Abs: 4.3 K/uL (ref 1.7–7.7)
Neutrophils Relative %: 74 %
Platelet Count: 154 K/uL (ref 150–400)
RBC: 4.4 MIL/uL (ref 4.22–5.81)
RDW: 17.2 % — ABNORMAL HIGH (ref 11.5–15.5)
WBC Count: 5.8 K/uL (ref 4.0–10.5)
nRBC: 0 % (ref 0.0–0.2)

## 2024-02-17 LAB — TSH: TSH: 4.43 u[IU]/mL (ref 0.350–4.500)

## 2024-02-17 MED ORDER — SODIUM CHLORIDE 0.9 % IV SOLN: Freq: Once | INTRAVENOUS | Status: AC

## 2024-02-17 MED ORDER — SODIUM CHLORIDE 0.9 % IV SOLN
1500.0000 mg | Freq: Once | INTRAVENOUS | Status: AC
  Administered 2024-02-17: 1500 mg via INTRAVENOUS
  Filled 2024-02-17: qty 30

## 2024-02-17 NOTE — Progress Notes (Signed)
 [facility] OFFICE PROGRESS NOTE  Patient Care Team: Maree Leni Edyth DELENA, MD as PCP - General (Family Medicine) Pietro Redell RAMAN, MD as PCP - Cardiology (Cardiology) Mealor, Eulas BRAVO, [name]  Assessment & Plan Non-small cell cancer of right lung [facility]) He was diagnosed with stage III non-small cell lung cancer in May 2024 Pathology: Non-small cell lung cancer, limited material for subtype, Foundation One test showed no actionable mutations. 6TMB, MSI low  It was unresectable at the time of diagnosis He received concurrent chemotherapy with combination of carboplatin  and paclitaxel  along with radiation treatment, followed by maintenance durvalumab  since August 2024  PET/CT imaging in December 2025 showed no definitive evidence of cancer recurrence His lungs still look abnormal from postradiation changes, I plan to monitor every 6 months, next due in June 2026 He tolerated durvalumab  well, without major side effects We will proceed without delay   No orders of the defined types were placed in this encounter.    [name], MD  INTERVAL HISTORY: he returns for treatment follow-up Complications related to previous cycle of chemotherapy included none  PHYSICAL EXAMINATION: ECOG PERFORMANCE STATUS: 0 - Asymptomatic  No results found for: CAN125    Latest Ref Rng & Units 02/17/2024   10:27 AM 01/20/2024   10:43 AM 12/23/2023    9:56 AM  CBC  WBC 4.0 - 10.5 K/uL 5.8  5.6  6.1   Hemoglobin 13.0 - 17.0 g/dL 86.2  86.4  86.4   Hematocrit 39.0 - 52.0 % 40.9  39.3  40.1   Platelets 150 - 400 K/uL 154  167  162       Chemistry      Component Value Date/Time   NA 140 02/17/2024 1027   NA 141 11/18/2023 1402   K 3.5 02/17/2024 1027   CL 104 02/17/2024 1027   CO2 25 02/17/2024 1027   BUN 14 02/17/2024 1027   BUN 13 11/18/2023 1402   CREATININE 1.12 02/17/2024 1027   CREATININE 0.88 09/15/2015 0959      Component Value  Date/Time   CALCIUM  9.0 02/17/2024 1027   ALKPHOS 76 02/17/2024 1027   AST 34 02/17/2024 1027   ALT 26 02/17/2024 1027   BILITOT 0.9 02/17/2024 1027       Vitals:   02/17/24 1047  BP: 117/61  Pulse: 79  Resp: 18  Temp: 97.7 F (36.5 C)  SpO2: 98%   Filed Weights   02/17/24 1047  Weight: 135 lb 3.2 oz (61.3 kg)   Other relevant data reviewed during this visit included CBC, CMP, TSH

## 2024-02-17 NOTE — Patient Instructions (Signed)
 [facility] - A DEPT OF [facility]  Discharge Instructions: You received your influenza shot today. Thank you for choosing [facility] to provide your oncology and hematology care.   If you have a lab appointment with the Cancer Center, please go directly to the Cancer Center and check in at the registration area.   Wear comfortable clothing and clothing appropriate for easy access to any Portacath or PICC line.   We strive to give you quality time with your provider. You may need to reschedule your appointment if you arrive late (15 or more minutes).  Arriving late affects you and other patients whose appointments are after yours.  Also, if you miss three or more appointments without notifying the office, you may be dismissed from the clinic at the provider's discretion.      For prescription refill requests, have your pharmacy contact our office and allow 72 hours for refills to be completed.    Today you received the following chemotherapy and/or immunotherapy agents: Durvalumab  (Imfinzi )    To help prevent nausea and vomiting after your treatment, we encourage you to take your nausea medication as directed.  BELOW ARE SYMPTOMS THAT SHOULD BE REPORTED IMMEDIATELY: *FEVER GREATER THAN 100.4 F (38 C) OR HIGHER *CHILLS OR SWEATING *NAUSEA AND VOMITING THAT IS NOT CONTROLLED WITH YOUR NAUSEA MEDICATION *UNUSUAL SHORTNESS OF BREATH *UNUSUAL BRUISING OR BLEEDING *URINARY PROBLEMS (pain or burning when urinating, or frequent urination) *BOWEL PROBLEMS (unusual diarrhea, constipation, pain near the anus) TENDERNESS IN MOUTH AND THROAT WITH OR WITHOUT PRESENCE OF ULCERS (sore throat, sores in mouth, or a toothache) UNUSUAL RASH, SWELLING OR PAIN  UNUSUAL VAGINAL DISCHARGE OR ITCHING   Items with * indicate a potential emergency and should be followed up as soon as possible or go to the Emergency Department if any problems should occur.  Please  show the CHEMOTHERAPY ALERT CARD or IMMUNOTHERAPY ALERT CARD at check-in to the Emergency Department and triage nurse.  Should you have questions after your visit or need to cancel or reschedule your appointment, please contact [facility] - A DEPT OF [facility]  Dept: [phone]  and follow the prompts.  Office hours are 8:00 a.m. to 4:30 p.m. Monday - Friday. Please note that voicemails left after 4:00 p.m. may not be returned until the following business day.  We are closed weekends and major holidays. You have access to a nurse at all times for urgent questions. Please call the main number to the clinic Dept: [phone] and follow the prompts.   For any non-urgent questions, you may also contact your provider using MyChart. We now offer e-Visits for anyone 65 and older to request care online for non-urgent symptoms. For details visit [email or web address].   Also download the MyChart app! Go to the app store, search MyChart, open the app, select [place], and log in with your MyChart username and password.  Influenza, Adult Influenza is also called the flu. It's an infection that affects your respiratory tract. This includes your nose, throat, windpipe, and lungs. The flu is contagious. This means it spreads easily from person to person. It causes symptoms that are like a cold. It can also cause a high fever and body aches. What are the causes? The flu is caused by the influenza virus. You can get it by: Breathing in droplets that are in the air after an infected person coughs or sneezes.  Touching something that has the virus on it and then touching your mouth, nose, or eyes. What increases the risk? You may be more likely to get the flu if: You don't wash your hands often. You're near a lot of people during cold and flu season. You touch your mouth, eyes, or nose without washing your hands first. You don't get a flu shot each year. You may also  be more at risk for the flu and serious problems, such as a lung infection called pneumonia, if: You're older than 65. You're pregnant. Your immune system is weak. Your immune system is your body's defense system. You have a long-term, or chronic, condition, such as: Heart, kidney, or lung disease. Diabetes. A liver disorder. Asthma. You're very overweight. You have anemia. This is when you don't have enough red blood cells in your body. What are the signs or symptoms? Flu symptoms often start all of a sudden. They may last 4-14 days and include: Fever and chills. Headaches, body aches, or muscle aches. Sore throat. Cough. Runny or stuffy nose. Discomfort in your chest. Not wanting to eat as much as normal. Feeling weak or tired. Feeling dizzy. Nausea or vomiting. How is this diagnosed? The flu may be diagnosed based on your symptoms and medical history. You may also have a physical exam. A swab may be taken from your nose or throat and tested for the virus. How is this treated? If the flu is found early, you can be treated with antiviral medicine. This may be given to you by mouth or through an IV. It can help you feel less sick and get better faster. Taking care of yourself at home can also help your symptoms get better. Your health care provider may tell you to: Take over-the-counter medicines. Drink lots of fluids. The flu often goes away on its own. If you have very bad symptoms or problems caused by the flu, you may need to be treated in a hospital. Follow these instructions at home: Activity Rest as needed. Get lots of sleep. Stay home from work or school as told by your provider. Leave home only to go see your provider. Do not leave home for other reasons until you don't have a fever for 24 hours without taking medicine. Eating and drinking Take an oral rehydration solution (ORS). This is a drink that is sold at pharmacies and stores. Drink enough fluid to keep your pee  pale yellow. Try to drink small amounts of clear fluids. These include water, ice chips, fruit juice mixed with water, and low-calorie sports drinks. Try to eat bland foods that are easy to digest. These include bananas, applesauce, rice, lean meats, toast, and crackers. Avoid drinks that have a lot of sugar or caffeine in them. These include energy drinks, regular sports drinks, and soda. Do not drink alcohol. Do not eat spicy or fatty foods. General instructions     Take your medicines only as told by your provider. Use a cool mist humidifier to add moisture to the air in your home. This can make it easier for you to breathe. You should also clean the humidifier every day. To do so: Empty the water. Pour clean water in. Cover your mouth and nose when you cough or sneeze. Wash your hands with soap and water often and for at least 20 seconds. It's extra important to do so after you cough or sneeze. If you can't use soap and water, use hand sanitizer. How is this prevented?  Get a flu shot every year. Ask your provider when you should get your flu shot. Stay away from people who are sick during fall and winter. Fall and winter are cold and flu season. Contact a health care provider if: You get new symptoms. You have chest pain. You have watery poop, also called diarrhea. You have a fever. Your cough gets worse. You start to have more mucus. You feel like you may vomit, or you vomit. Get help right away if: You become short of breath or have trouble breathing. Your skin or nails turn blue. You have very bad pain or stiffness in your neck. You get a sudden headache or pain in your face or ear. You vomit each time you eat or drink. These symptoms may be an emergency. Call 911 right away. Do not wait to see if the symptoms will go away. Do not drive yourself to the hospital. This information is not intended to replace advice given to you by your health care provider. Make sure you  discuss any questions you have with your health care provider. Document Revised: 10/24/2022 Document Reviewed: 02/28/2022 Elsevier Patient Education  2024 [employer] - A DEPT OF [facility]  Discharge Instructions: You received your influenza shot today. Thank you for choosing [facility] to provide your oncology and hematology care.   If you have a lab appointment with the Cancer Center, please go directly to the Cancer Center and check in at the registration area.   Wear comfortable clothing and clothing appropriate for easy access to any Portacath or PICC line.   We strive to give you quality time with your provider. You may need to reschedule your appointment if you arrive late (15 or more minutes).  Arriving late affects you and other patients whose appointments are after yours.  Also, if you miss three or more appointments without notifying the office, you may be dismissed from the clinic at the provider's discretion.      For prescription refill requests, have your pharmacy contact our office and allow 72 hours for refills to be completed.    Today you received the following chemotherapy and/or immunotherapy agents: Durvalumab  (Imfinzi )    To help prevent nausea and vomiting after your treatment, we encourage you to take your nausea medication as directed.  BELOW ARE SYMPTOMS THAT SHOULD BE REPORTED IMMEDIATELY: *FEVER GREATER THAN 100.4 F (38 C) OR HIGHER *CHILLS OR SWEATING *NAUSEA AND VOMITING THAT IS NOT CONTROLLED WITH YOUR NAUSEA MEDICATION *UNUSUAL SHORTNESS OF BREATH *UNUSUAL BRUISING OR BLEEDING *URINARY PROBLEMS (pain or burning when urinating, or frequent urination) *BOWEL PROBLEMS (unusual diarrhea, constipation, pain near the anus) TENDERNESS IN MOUTH AND THROAT WITH OR WITHOUT PRESENCE OF ULCERS (sore throat, sores in mouth, or a toothache) UNUSUAL RASH, SWELLING OR PAIN  UNUSUAL VAGINAL DISCHARGE OR  ITCHING   Items with * indicate a potential emergency and should be followed up as soon as possible or go to the Emergency Department if any problems should occur.  Please show the CHEMOTHERAPY ALERT CARD or IMMUNOTHERAPY ALERT CARD at check-in to the Emergency Department and triage nurse.  Should you have questions after your visit or need to cancel or reschedule your appointment, please contact [facility] DEPT [facility]  Dept: [phone]  and follow the prompts.  Office hours are 8:00 a.m. to 4:30 p.m. Monday - Friday. Please note that  voicemails left after 4:00 p.m. may not be returned until the following business day.  We are closed weekends and major holidays. You have access to a nurse at all times for urgent questions. Please call the main number to the clinic Dept: [phone] and follow the prompts.   For any non-urgent questions, you may also contact your provider using MyChart. We now offer e-Visits for anyone 60 and older to request care online for non-urgent symptoms. For details visit [email or web address].   Also download the MyChart app! Go to the app store, search MyChart, open the app, select [place], and log in with your MyChart username and password.  Influenza, Adult Influenza is also called the flu. It's an infection that affects your respiratory tract. This includes your nose, throat, windpipe, and lungs. The flu is contagious. This means it spreads easily from person to person. It causes symptoms that are like a cold. It can also cause a high fever and body aches. What are the causes? The flu is caused by the influenza virus. You can get it by: Breathing in droplets that are in the air after an infected person coughs or sneezes. Touching something that has the virus on it and then touching your mouth, nose, or eyes. What increases the risk? You may be more likely to get the flu if: You don't wash your hands  often. You're near a lot of people during cold and flu season. You touch your mouth, eyes, or nose without washing your hands first. You don't get a flu shot each year. You may also be more at risk for the flu and serious problems, such as a lung infection called pneumonia, if: You're older than 65. You're pregnant. Your immune system is weak. Your immune system is your body's defense system. You have a long-term, or chronic, condition, such as: Heart, kidney, or lung disease. Diabetes. A liver disorder. Asthma. You're very overweight. You have anemia. This is when you don't have enough red blood cells in your body. What are the signs or symptoms? Flu symptoms often start all of a sudden. They may last 4-14 days and include: Fever and chills. Headaches, body aches, or muscle aches. Sore throat. Cough. Runny or stuffy nose. Discomfort in your chest. Not wanting to eat as much as normal. Feeling weak or tired. Feeling dizzy. Nausea or vomiting. How is this diagnosed? The flu may be diagnosed based on your symptoms and medical history. You may also have a physical exam. A swab may be taken from your nose or throat and tested for the virus. How is this treated? If the flu is found early, you can be treated with antiviral medicine. This may be given to you by mouth or through an IV. It can help you feel less sick and get better faster. Taking care of yourself at home can also help your symptoms get better. Your health care provider may tell you to: Take over-the-counter medicines. Drink lots of fluids. The flu often goes away on its own. If you have very bad symptoms or problems caused by the flu, you may need to be treated in a hospital. Follow these instructions at home: Activity Rest as needed. Get lots of sleep. Stay home from work or school as told by your provider. Leave home only to go see your provider. Do not leave home for other reasons until you don't have a fever for 24  hours without taking medicine. Eating and drinking Take an oral rehydration solution (ORS). This  is a drink that is sold at pharmacies and stores. Drink enough fluid to keep your pee pale yellow. Try to drink small amounts of clear fluids. These include water, ice chips, fruit juice mixed with water, and low-calorie sports drinks. Try to eat bland foods that are easy to digest. These include bananas, applesauce, rice, lean meats, toast, and crackers. Avoid drinks that have a lot of sugar or caffeine in them. These include energy drinks, regular sports drinks, and soda. Do not drink alcohol. Do not eat spicy or fatty foods. General instructions     Take your medicines only as told by your provider. Use a cool mist humidifier to add moisture to the air in your home. This can make it easier for you to breathe. You should also clean the humidifier every day. To do so: Empty the water. Pour clean water in. Cover your mouth and nose when you cough or sneeze. Wash your hands with soap and water often and for at least 20 seconds. It's extra important to do so after you cough or sneeze. If you can't use soap and water, use hand sanitizer. How is this prevented?  Get a flu shot every year. Ask your provider when you should get your flu shot. Stay away from people who are sick during fall and winter. Fall and winter are cold and flu season. Contact a health care provider if: You get new symptoms. You have chest pain. You have watery poop, also called diarrhea. You have a fever. Your cough gets worse. You start to have more mucus. You feel like you may vomit, or you vomit. Get help right away if: You become short of breath or have trouble breathing. Your skin or nails turn blue. You have very bad pain or stiffness in your neck. You get a sudden headache or pain in your face or ear. You vomit each time you eat or drink. These symptoms may be an emergency. Call 911 right away. Do not wait to  see if the symptoms will go away. Do not drive yourself to the hospital. This information is not intended to replace advice given to you by your health care provider. Make sure you discuss any questions you have with your health care provider. Document Revised: 10/24/2022 Document Reviewed: 02/28/2022 Elsevier Patient Education  2024 [employer].

## 2024-02-17 NOTE — Assessment & Plan Note (Addendum)
 He was diagnosed with stage III non-small cell lung cancer in May 2024 Pathology: Non-small cell lung cancer, limited material for subtype, Foundation One test showed no actionable mutations. 6TMB, MSI low  It was unresectable at the time of diagnosis He received concurrent chemotherapy with combination of carboplatin  and paclitaxel  along with radiation treatment, followed by maintenance durvalumab  since August 2024  PET/CT imaging in December 2025 showed no definitive evidence of cancer recurrence His lungs still look abnormal from postradiation changes, I plan to monitor every 6 months, next due in June 2026 He tolerated durvalumab  well, without major side effects We will proceed without delay

## 2024-02-18 LAB — T4: T4, Total: 10.3 ug/dL (ref 4.5–12.0)

## 2024-03-01 ENCOUNTER — Encounter

## 2024-03-11 ENCOUNTER — Encounter

## 2024-03-12 NOTE — Progress Notes (Unsigned)
 "   Cardiology Office Note Date:  03/12/2024  Patient ID:  Benjamin, Cook 05-03-1945, MRN [id number] PCP:  Maree Leni Edyth DELENA, MD  Cardiologist:  Dr. Pietro Electrophysiologist: Dr. Kelsie >>> Dr. Nancey    Chief Complaint:  *** 90 day post gen change visit  History of Present Illness: Benjamin Cook is a 79 y.o. male with history of  anxiety, HTN, HLD, COPD Lung Cancer (found May 2024) stage III non-small cell lung cancer  unresectable at the time of diagnosis received concurrent chemotherapy with combination of carboplatin  and paclitaxel  along with radiation treatment, followed by maintenance durvalumab  follows with heme-on  CAD (s/p PCI to LAD 2001),  ICM, chronic CHF (systolic), ICD, VT, AFib,  AAA (s/p endovascular repair, Dr. Sheree) PVD: mod right RAS  He saw Dr. Terrial 09/10/23, mild DOE only, no other complaints.  No changes made.  He saw Dr. Nancey 11/18/23, device was ERI, no reproducible  A lead noise, rare A pacing burden, planned for gen change  Gen change 12/15/23  TODAY  *** site *** A lead noise? *** VT? *** amio labs *** symptoms  Device information SJM dual chamber ICD,  Implanted 2012, gen change 03/18/2014, gen change 12/15/2023 Has an abandoned RV lead (fracture), new lead placed 2018  Known A lead noise   Arrhythmia Hx AFib  VT: 04/21/2019  VT episode, MMVT, 203bpm, one ATP delivered failed, , one HV therapy, 25J with clean break ATP therapies were added 10/30/19: VT w/HV tx (pt preferred to hold off AAD), stress with only peri-infarct ischemia, not felt to need cath  VT ablation apical LV focus, Dec 2022  VT April 2024 > started on amiodarone   AAD Amiodarone  started 2024  Past Medical History:  Diagnosis Date   AAA (abdominal aortic aneurysm)    Adrenal mass    per pt this is remote (10 years) and benign by biopsy   AICD (automatic cardioverter/defibrillator) present    Anxiety    Arthritis    CAD (coronary artery disease)     a. s/p PCI to LAD 2001 b. myoview  2014 high risk with scar LAD/RCA territory but no ischemia   Cancer ([name])    Cardiomyopathy, ischemic    Chronic HFrEF (heart failure with reduced ejection fraction) ([name])    COPD (chronic obstructive pulmonary disease) ([name])    smokes cigars but has quit cigarettes   Defibrillator discharge 04/21/2019   Diverticulitis    Dyspnea    Flu 03/2015   HTN (hypertension)    Hyperlipidemia    Paroxysmal atrial fibrillation ([name]) 03/2015   chads2vasc score is 4   PSA (psoriatic arthritis) ([name])    increased   PVD (peripheral vascular disease)    VT (ventricular tachycardia) ([name])     Past Surgical History:  Procedure Laterality Date   ABDOMINAL AORTIC ENDOVASCULAR STENT GRAFT N/A 03/03/2017   Procedure: ABDOMINAL AORTIC ENDOVASCULAR STENT GRAFT;  Surgeon: Sheree Penne Bruckner, MD;  Location: [facility] OR;  Service: Vascular;  Laterality: N/A;   BRONCHIAL NEEDLE ASPIRATION BIOPSY  06/20/2022   Procedure: BRONCHIAL NEEDLE ASPIRATION BIOPSIES;  Surgeon: Gladis Leonor HERO, MD;  Location: [facility] ENDOSCOPY;  Service: Pulmonary;;   cataract surgery     CORONARY ANGIOGRAPHY N/A 10/12/2020   Procedure: CORONARY ANGIOGRAPHY;  Surgeon: Court Dorn PARAS, MD;  Location: [facility] INVASIVE CV LAB;  Service: Cardiovascular;  Laterality: N/A;   CORONARY ANGIOPLASTY WITH STENT PLACEMENT  2001   a. PCI to LAD   ICD GENERATOR CHANGEOUT N/A  12/15/2023   Procedure: ICD GENERATOR CHANGEOUT;  Surgeon: Nancey Eulas BRAVO, MD;  Location: [facility] INVASIVE CV LAB;  Service: Cardiovascular;  Laterality: N/A;   IMPLANTABLE CARDIOVERTER DEFIBRILLATOR (ICD) GENERATOR CHANGE N/A 03/18/2014   a. SJM Fortify ST DR ICD implanted by [facility] for primary prevention b. gen change 03/2014    IR IMAGING GUIDED PORT INSERTION  07/09/2022   LEAD REVISION/REPAIR N/A 07/17/2016   Procedure: Lead Revision/Repair;  Surgeon: Waddell Danelle ORN, MD;  Location: [facility] INVASIVE CV LAB;  Service: Cardiovascular;  Laterality: N/A;   LEFT  HEART CATHETERIZATION WITH CORONARY ANGIOGRAM N/A 05/27/2014   Procedure: LEFT HEART CATHETERIZATION WITH CORONARY ANGIOGRAM;  Surgeon: Ozell Fell, MD;  Location: [facility] CATH LAB;  Service: Cardiovascular;  Laterality: N/A;   REPAIR KNEE LIGAMENT     V TACH ABLATION N/A 10/26/2020   Procedure: V TACH ABLATION;  Surgeon: Waddell Danelle ORN, MD;  Location: [facility] INVASIVE CV LAB;  Service: Cardiovascular;  Laterality: N/A;   VIDEO BRONCHOSCOPY WITH ENDOBRONCHIAL ULTRASOUND N/A 06/20/2022   Procedure: VIDEO BRONCHOSCOPY WITH ENDOBRONCHIAL ULTRASOUND;  Surgeon: Gladis Leonor HERO, MD;  Location: [facility] ENDOSCOPY;  Service: Pulmonary;  Laterality: N/A;    Current Outpatient Medications  Medication Sig Dispense Refill   acetaminophen  (TYLENOL ) 500 MG tablet Take 500-1,000 mg by mouth every 8 (eight) hours as needed (for pain).      amiodarone  (PACERONE ) 200 MG tablet Take 1 tablet (200 mg total) by mouth daily. 90 tablet 3   atorvastatin  (LIPITOR ) 80 MG tablet TAKE 1 TABLET BY MOUTH DAILY AT 6 PM 90 tablet 0   Camphor-Menthol-Methyl Sal (SALONPAS) 3.02-10-08 % PTCH Apply 1 patch topically daily as needed (pain).     Chlorpheniramine Maleate (ALLERGY RELIEF PO) Take 1 tablet by mouth daily as needed (allergies).     empagliflozin  (JARDIANCE ) 10 MG TABS tablet Take 1 tablet (10 mg total) by mouth daily before breakfast. 90 tablet 3   furosemide  (LASIX ) 40 MG tablet TAKE 1/2 TABLET BY MOUTH DAILY 45 tablet 11   gabapentin  (NEURONTIN ) 100 MG capsule Take 1 capsule (100 mg total) by mouth at bedtime. 30 capsule 0   lidocaine -prilocaine  (EMLA ) cream APPLY TO AFFECTED AREA(S) ONCE AS DIRECTED 30 g 11   losartan  (COZAAR ) 25 MG tablet TAKE 1/2 TABLET BY MOUTH DAILY 45 tablet 11   melatonin 5 MG TABS Take 1 tablet (5 mg total) by mouth at bedtime. 30 tablet 0   Menthol, Topical Analgesic, 16 % CREA Apply 1 application  topically daily as needed (Pain).     metoprolol  succinate (TOPROL -XL) 25 MG 24 hr tablet TAKE 1/2 TABLET  BY MOUTH 2 TIMES DAILY 90 tablet 3   Multiple Vitamin (MULTIVITAMIN) capsule Take 1 capsule by mouth daily.     ondansetron  (ZOFRAN ) 8 MG tablet Take 1 tablet (8 mg total) by mouth every 8 (eight) hours as needed for nausea. (Patient not taking: Reported on 12/10/2023) 30 tablet 3   oxyCODONE  (OXY IR/ROXICODONE ) 5 MG immediate release tablet Take 1 tablet (5 mg total) by mouth every 6 (six) hours as needed for severe pain (pain score 7-10). 60 tablet 0   pantoprazole  (PROTONIX ) 40 MG tablet Take 40 mg by mouth daily.     Rivaroxaban  (XARELTO ) 15 MG TABS tablet Take 1 tablet (15 mg total) by mouth daily with supper. 30 tablet 5   senna-docusate (SENOKOT-S) 8.6-50 MG tablet Take 2 tablets by mouth daily. 60 tablet 1   spironolactone  (ALDACTONE ) 25 MG tablet TAKE 1/2 TABLET BY MOUTH  DAILY 45 tablet 11   tamsulosin  (FLOMAX ) 0.4 MG CAPS capsule Take 0.4 mg by mouth daily after supper.     No current facility-administered medications for this visit.    Allergies:   Paclitaxel    Social History:  The patient  reports that he has been smoking cigars. He has never used smokeless tobacco. He reports that he does not drink alcohol and does not use drugs.   Family History:  The patient's family history includes Cirrhosis in his mother.  ROS:  Please see the history of present illness.    All other systems are reviewed and otherwise negative.   PHYSICAL EXAM:  VS:  There were no vitals taken for this visit. BMI: There is no height or weight on file to calculate BMI. Well nourished, well developed, in no acute distress HEENT: normocephalic, atraumatic Neck: no JVD, carotid bruits or masses Cardiac: *** RRR; no significant murmurs, no rubs, or gallops Lungs: *** CTA b/l, with slight end exp wheezing b/l, movinng air well,  no rhonchi or rales Abd: soft, nontender MS: no deformity, age appropriateatrophy Ext: *** no edema Skin: warm and dry, no rash Neuro:  No gross deficits appreciated Psych:  euthymic mood, full affect  *** ICD site is stable, no tethering or discomfort   EKG:  Not done today  Device interrogation done today and reviewed by myself:  *** Battery and ead measurements are good *** No true AF  *** No VT Known A lead noise, *** unable to reproduce today   11/23/2019: stress myoview  Nuclear stress EF: 22%. No T wave inversion was noted during stress. There was no ST segment deviation noted during stress. Defect 1: There is a large defect of severe severity present in the basal inferior, mid anteroseptal, mid inferoseptal, mid inferior, apical septal, apical inferior, apical lateral and apex location. Findings consistent with prior myocardial infarction with peri-infarct ischemia. This is a high risk study. The left ventricular ejection fraction is severely decreased (<30%).   Large size, severe severity mostly fixed (SDS 3) apical to mid inferior, apical, apical septal and anteroapical perfusion defect suggestive of mid to distal LAD and RCA territory scar with minimal peri-infarct ischemia. LVEF 22% with akinesis of the previously mentioned walls. This is a high risk study. Compared to a prior study on 12/21/2012, there is no significant change.    04/09/2019; TTE IMPRESSIONS   1. Left ventricular ejection fraction, by estimation, is 35%%. The left  ventricle has moderate to severely decreased function. There is mild left  ventricular hypertrophy. Left ventricular diastolic parameters are  consistent with Grade I diastolic  dysfunction (impaired relaxation).   2. Right ventricular systolic function is normal. The right ventricular  size is normal.   3. The mitral valve is normal in structure and function. Trivial mitral  valve regurgitation.   4. The aortic valve is abnormal. Aortic valve regurgitation is trivial.  Mild aortic valve sclerosis is present, with no evidence of aortic valve  stenosis.   5. The inferior vena cava is normal in size with  greater than 50%  respiratory variability, suggesting right atrial pressure of 3 mmHg.     Recent Labs: 02/17/2024: ALT 26; BUN 14; Creatinine 1.12; Hemoglobin 13.7; Platelet Count 154; Potassium 3.5; Sodium 140; TSH 4.430  No results found for requested labs within last 365 days.   CrCl cannot be calculated (Patient's most recent lab result is older than the maximum 21 days allowed.).   Wt Readings from Last 3  Encounters:  02/17/24 135 lb 3.2 oz (61.3 kg)  01/20/24 132 lb 12.8 oz (60.2 kg)  12/23/23 131 lb 9.6 oz (59.7 kg)     Other studies reviewed: Additional studies/records reviewed today include: summarized above  ASSESSMENT AND PLAN:  1. ICD     *** Intact function     *** no programming changes made  2. VT     None on device check today     Amiodarone      ***  3. ICM 4. Chronic CHF (systolic)      *** CorVue is at baseline/stable      *** No symptoms or exam findings to suggest volume OL      BP limits GDMT      C/w Dr. Pietro  5. Paroxysmal Afib     CHA2DS2Vasc is 5-6, on Xarelto , *** appropriately dosed     *** % burden  6. CAD     *** No anginal symptoms     *** On statin, BB, no ASA with xarelto      C/w Dr. Pietro  7. Secondary hypercoagulable state    Disposition: remotes as usual, in clinic again in ***, sooner if needed   Current medicines are reviewed at length with the patient today.  The patient did not have any concerns regarding medicines.  Bonney Charlies Arthur, PA-C 03/12/2024 12:03 PM     [facility] [address] [place] [address] [phone] (office)  [phone] (fax)   "

## 2024-03-15 ENCOUNTER — Ambulatory Visit: Admitting: Physician Assistant

## 2024-03-16 ENCOUNTER — Inpatient Hospital Stay: Admitting: Hematology and Oncology

## 2024-03-16 ENCOUNTER — Inpatient Hospital Stay: Attending: Hematology and Oncology

## 2024-03-16 ENCOUNTER — Inpatient Hospital Stay

## 2024-04-01 ENCOUNTER — Encounter

## 2024-05-05 ENCOUNTER — Ambulatory Visit

## 2024-08-04 ENCOUNTER — Ambulatory Visit

## 2024-11-03 ENCOUNTER — Ambulatory Visit

## 2025-02-02 ENCOUNTER — Ambulatory Visit

## 2025-05-04 ENCOUNTER — Ambulatory Visit
